# Patient Record
Sex: Male | Born: 1940 | Race: White | Hispanic: No | Marital: Married | Smoking: Former smoker
Health system: Southern US, Community
[De-identification: ages and names within clinical notes are randomized; demographics above are authoritative.]

## PROBLEM LIST (undated history)

## (undated) DIAGNOSIS — J449 Chronic obstructive pulmonary disease, unspecified: Secondary | ICD-10-CM

## (undated) DIAGNOSIS — I4891 Unspecified atrial fibrillation: Secondary | ICD-10-CM

## (undated) DIAGNOSIS — Z9289 Personal history of other medical treatment: Secondary | ICD-10-CM

## (undated) DIAGNOSIS — K819 Cholecystitis, unspecified: Secondary | ICD-10-CM

## (undated) DIAGNOSIS — I10 Essential (primary) hypertension: Secondary | ICD-10-CM

## (undated) DIAGNOSIS — I2510 Atherosclerotic heart disease of native coronary artery without angina pectoris: Secondary | ICD-10-CM

## (undated) DIAGNOSIS — I509 Heart failure, unspecified: Secondary | ICD-10-CM

## (undated) DIAGNOSIS — K449 Diaphragmatic hernia without obstruction or gangrene: Secondary | ICD-10-CM

## (undated) DIAGNOSIS — E039 Hypothyroidism, unspecified: Secondary | ICD-10-CM

## (undated) DIAGNOSIS — M1990 Unspecified osteoarthritis, unspecified site: Secondary | ICD-10-CM

## (undated) DIAGNOSIS — K219 Gastro-esophageal reflux disease without esophagitis: Secondary | ICD-10-CM

## (undated) DIAGNOSIS — N189 Chronic kidney disease, unspecified: Secondary | ICD-10-CM

## (undated) DIAGNOSIS — D649 Anemia, unspecified: Secondary | ICD-10-CM

## (undated) DIAGNOSIS — K8020 Calculus of gallbladder without cholecystitis without obstruction: Secondary | ICD-10-CM

## (undated) DIAGNOSIS — D689 Coagulation defect, unspecified: Secondary | ICD-10-CM

## (undated) DIAGNOSIS — K31819 Angiodysplasia of stomach and duodenum without bleeding: Secondary | ICD-10-CM

## (undated) DIAGNOSIS — E785 Hyperlipidemia, unspecified: Secondary | ICD-10-CM

## (undated) DIAGNOSIS — M109 Gout, unspecified: Secondary | ICD-10-CM

## (undated) DIAGNOSIS — R195 Other fecal abnormalities: Secondary | ICD-10-CM

## (undated) DIAGNOSIS — R160 Hepatomegaly, not elsewhere classified: Secondary | ICD-10-CM

## (undated) DIAGNOSIS — Z86010 Personal history of colonic polyps: Secondary | ICD-10-CM

## (undated) DIAGNOSIS — Z95810 Presence of automatic (implantable) cardiac defibrillator: Secondary | ICD-10-CM

## (undated) HISTORY — PX: CORONARY ANGIOPLASTY WITH STENT PLACEMENT: SHX49

## (undated) HISTORY — DX: Chronic obstructive pulmonary disease, unspecified: J44.9

## (undated) HISTORY — DX: Other fecal abnormalities: R19.5

## (undated) HISTORY — DX: Diaphragmatic hernia without obstruction or gangrene: K44.9

## (undated) HISTORY — PX: RENAL BIOPSY: SHX156

## (undated) HISTORY — PX: HERNIA REPAIR: SHX51

## (undated) HISTORY — DX: Hyperlipidemia, unspecified: E78.5

## (undated) HISTORY — DX: Unspecified atrial fibrillation: I48.91

## (undated) HISTORY — DX: Gastro-esophageal reflux disease without esophagitis: K21.9

## (undated) HISTORY — PX: OTHER SURGICAL HISTORY: SHX169

## (undated) HISTORY — DX: Anemia, unspecified: D64.9

## (undated) HISTORY — DX: Coagulation defect, unspecified: D68.9

## (undated) HISTORY — DX: Atherosclerotic heart disease of native coronary artery without angina pectoris: I25.10

## (undated) HISTORY — DX: Hypothyroidism, unspecified: E03.9

## (undated) HISTORY — DX: Calculus of gallbladder without cholecystitis without obstruction: K80.20

## (undated) HISTORY — DX: Angiodysplasia of stomach and duodenum without bleeding: K31.819

## (undated) HISTORY — DX: Gout, unspecified: M10.9

## (undated) HISTORY — DX: Personal history of colonic polyps: Z86.010

## (undated) HISTORY — DX: Hepatomegaly, not elsewhere classified: R16.0

## (undated) HISTORY — DX: Essential (primary) hypertension: I10

---

## 1969-06-04 HISTORY — PX: UMBILICAL HERNIA REPAIR: SHX196

## 1996-10-04 HISTORY — PX: CORONARY ARTERY BYPASS GRAFT: SHX141

## 1998-11-25 ENCOUNTER — Encounter: Payer: Self-pay | Admitting: Family Medicine

## 1998-11-25 ENCOUNTER — Ambulatory Visit (HOSPITAL_COMMUNITY): Admission: RE | Admit: 1998-11-25 | Discharge: 1998-11-25 | Payer: Self-pay | Admitting: Family Medicine

## 1998-12-08 ENCOUNTER — Encounter: Admission: RE | Admit: 1998-12-08 | Discharge: 1999-03-08 | Payer: Self-pay | Admitting: Family Medicine

## 1998-12-19 ENCOUNTER — Ambulatory Visit (HOSPITAL_COMMUNITY): Admission: RE | Admit: 1998-12-19 | Discharge: 1998-12-19 | Payer: Self-pay | Admitting: Family Medicine

## 1998-12-19 ENCOUNTER — Encounter: Payer: Self-pay | Admitting: Family Medicine

## 1999-02-11 ENCOUNTER — Encounter: Admission: RE | Admit: 1999-02-11 | Discharge: 1999-05-12 | Payer: Self-pay | Admitting: Family Medicine

## 1999-05-19 ENCOUNTER — Ambulatory Visit: Admission: RE | Admit: 1999-05-19 | Discharge: 1999-05-19 | Payer: Self-pay | Admitting: Neurosurgery

## 1999-07-22 ENCOUNTER — Ambulatory Visit (HOSPITAL_COMMUNITY): Admission: RE | Admit: 1999-07-22 | Discharge: 1999-07-22 | Payer: Self-pay | Admitting: Neurosurgery

## 1999-07-22 ENCOUNTER — Encounter: Payer: Self-pay | Admitting: Neurosurgery

## 1999-12-04 ENCOUNTER — Encounter: Admission: RE | Admit: 1999-12-04 | Discharge: 1999-12-04 | Payer: Self-pay | Admitting: Neurosurgery

## 2000-01-05 ENCOUNTER — Encounter: Admission: RE | Admit: 2000-01-05 | Discharge: 2000-02-11 | Payer: Self-pay | Admitting: *Deleted

## 2000-03-04 ENCOUNTER — Encounter: Admission: RE | Admit: 2000-03-04 | Discharge: 2000-06-02 | Payer: Self-pay | Admitting: Anesthesiology

## 2004-11-30 ENCOUNTER — Encounter: Admission: RE | Admit: 2004-11-30 | Discharge: 2004-11-30 | Payer: Self-pay | Admitting: Interventional Cardiology

## 2004-12-01 ENCOUNTER — Inpatient Hospital Stay (HOSPITAL_BASED_OUTPATIENT_CLINIC_OR_DEPARTMENT_OTHER): Admission: RE | Admit: 2004-12-01 | Discharge: 2004-12-01 | Payer: Self-pay | Admitting: Interventional Cardiology

## 2004-12-03 ENCOUNTER — Ambulatory Visit (HOSPITAL_COMMUNITY): Admission: RE | Admit: 2004-12-03 | Discharge: 2004-12-04 | Payer: Self-pay | Admitting: Interventional Cardiology

## 2004-12-11 ENCOUNTER — Ambulatory Visit (HOSPITAL_COMMUNITY): Admission: RE | Admit: 2004-12-11 | Discharge: 2004-12-11 | Payer: Self-pay | Admitting: Interventional Cardiology

## 2005-11-15 ENCOUNTER — Ambulatory Visit: Payer: Self-pay | Admitting: Internal Medicine

## 2005-12-28 ENCOUNTER — Ambulatory Visit: Payer: Self-pay | Admitting: Internal Medicine

## 2006-12-12 ENCOUNTER — Ambulatory Visit: Payer: Self-pay | Admitting: Pulmonary Disease

## 2007-01-23 ENCOUNTER — Ambulatory Visit: Payer: Self-pay | Admitting: Internal Medicine

## 2007-01-27 ENCOUNTER — Inpatient Hospital Stay (HOSPITAL_BASED_OUTPATIENT_CLINIC_OR_DEPARTMENT_OTHER): Admission: RE | Admit: 2007-01-27 | Discharge: 2007-01-27 | Payer: Self-pay | Admitting: Interventional Cardiology

## 2007-09-22 ENCOUNTER — Telehealth (INDEPENDENT_AMBULATORY_CARE_PROVIDER_SITE_OTHER): Payer: Self-pay | Admitting: *Deleted

## 2007-10-27 ENCOUNTER — Telehealth (INDEPENDENT_AMBULATORY_CARE_PROVIDER_SITE_OTHER): Payer: Self-pay | Admitting: *Deleted

## 2008-02-03 ENCOUNTER — Encounter: Payer: Self-pay | Admitting: Internal Medicine

## 2008-02-03 DIAGNOSIS — I10 Essential (primary) hypertension: Secondary | ICD-10-CM | POA: Insufficient documentation

## 2008-04-15 ENCOUNTER — Encounter: Admission: RE | Admit: 2008-04-15 | Discharge: 2008-04-15 | Payer: Self-pay | Admitting: Interventional Cardiology

## 2010-05-11 ENCOUNTER — Telehealth (INDEPENDENT_AMBULATORY_CARE_PROVIDER_SITE_OTHER): Payer: Self-pay | Admitting: *Deleted

## 2010-05-11 ENCOUNTER — Telehealth: Payer: Self-pay | Admitting: Gastroenterology

## 2010-05-11 ENCOUNTER — Encounter: Payer: Self-pay | Admitting: Gastroenterology

## 2010-05-13 ENCOUNTER — Ambulatory Visit: Payer: Self-pay | Admitting: Gastroenterology

## 2010-05-13 ENCOUNTER — Encounter (INDEPENDENT_AMBULATORY_CARE_PROVIDER_SITE_OTHER): Payer: Self-pay | Admitting: *Deleted

## 2010-05-13 ENCOUNTER — Encounter: Payer: Self-pay | Admitting: Internal Medicine

## 2010-05-13 DIAGNOSIS — R160 Hepatomegaly, not elsewhere classified: Secondary | ICD-10-CM | POA: Insufficient documentation

## 2010-05-13 DIAGNOSIS — D689 Coagulation defect, unspecified: Secondary | ICD-10-CM | POA: Insufficient documentation

## 2010-05-13 DIAGNOSIS — K219 Gastro-esophageal reflux disease without esophagitis: Secondary | ICD-10-CM | POA: Insufficient documentation

## 2010-05-13 DIAGNOSIS — J439 Emphysema, unspecified: Secondary | ICD-10-CM | POA: Insufficient documentation

## 2010-05-13 DIAGNOSIS — D509 Iron deficiency anemia, unspecified: Secondary | ICD-10-CM | POA: Insufficient documentation

## 2010-05-13 LAB — CONVERTED CEMR LAB
Folate: >20 ng/mL
Saturation Ratios: 3.8 % — ABNORMAL LOW (ref 20.0–50.0)
Vitamin B-12: 297 pg/mL (ref 211–911)

## 2010-05-18 ENCOUNTER — Encounter: Payer: Self-pay | Admitting: Internal Medicine

## 2010-05-29 ENCOUNTER — Ambulatory Visit: Payer: Self-pay | Admitting: Gastroenterology

## 2010-05-29 DIAGNOSIS — Z860100 Personal history of colon polyps, unspecified: Secondary | ICD-10-CM

## 2010-05-29 HISTORY — DX: Personal history of colonic polyps: Z86.010

## 2010-05-29 HISTORY — DX: Personal history of colon polyps, unspecified: Z86.0100

## 2010-06-03 ENCOUNTER — Encounter: Payer: Self-pay | Admitting: Gastroenterology

## 2010-06-12 ENCOUNTER — Ambulatory Visit: Payer: Self-pay | Admitting: Internal Medicine

## 2010-06-12 ENCOUNTER — Encounter: Payer: Self-pay | Admitting: Gastroenterology

## 2010-06-15 ENCOUNTER — Encounter: Payer: Self-pay | Admitting: Gastroenterology

## 2010-06-29 ENCOUNTER — Telehealth: Payer: Self-pay | Admitting: Gastroenterology

## 2010-11-03 NOTE — Letter (Signed)
Summary: Anticoagulation Modification Letter  [facility]  [place], [place] [address]   Phone: [phone]  Fax: [phone]    May 13, 2010  Re:    [name] [name] DOB:    04-03-1941 MRN:    [id number]    Dear Dr. Tamala Julian:   We have scheduled the above patient for an endoscopic procedure with Dr. Verl Blalock. Our records show that he is on anticoagulation therapy. Please advise as to how long the patient may come off their therapy of Coumadin prior to the scheduled procedure(s) on May 29, 2010.   Please fax the completed form to Abelino Derrick, [place] at [phone].  Thank you for your help with this matter.  Sincerely,  Abelino Derrick CMA Deborra Medina)   Physician Recommendation:  Hold Coumadin 5 days prior ____________  Other ______________________________     Appended Document: Anticoagulation Modification Letter Received fax from Dr. Tamala Julian.  OK to stop Coumadin for 5 days prior to colon. LM for pt to call  Appended Document: Anticoagulation Modification Letter Pt. advised to hold his Coumadin for 5 days prior to his Colonoscopy. Pt. instructed to call back as needed.

## 2010-11-03 NOTE — Procedures (Signed)
Summary: 280.0,787.1/dfs--NO PCR  Patient here today for capsule endoscopy for Dr. Sharlett Iles.  Pt verbalized understanding of all verbal and written instructions.  Pt tolerated well.  Lot #  [id number] exp 2012-10.                                                                     25

## 2010-11-03 NOTE — Progress Notes (Signed)
Summary: Test results  Phone Note Call from Patient Call back at Home Phone [phone]   Caller: Patient Call For: Dr. Sharlett Iles Reason for Call: Lab or Test Results Summary of Call: Calling about his endo capsule results Initial call taken by: Webb Laws,  June 29, 2010 1:21 PM  Follow-up for Phone Call        Left message for patient to call back [place], [facility]  June 29, 2010 1:54 PM  Left message for patient to call back Barb Merino RN, [facility]  June 30, 2010 9:57 AM  reviewed results with the patient.  All questions answered. Follow-up by: Barb Merino RN, CGRN,  June 30, 2010 11:24 AM

## 2010-11-03 NOTE — Letter (Signed)
Summary: Patient Notice-Endo Biopsy Results  [facility]  [place], [place] [address]   Phone: [phone]  Fax: [phone]        June 03, 2010 MRN: [id number]    [name] [name] [place], [place]  [address]    Dear [name],  I am pleased to inform you that the biopsies taken during your recent endoscopic examination did not show any evidence of cancer upon pathologic examination.  Additional information/recommendations:  __No further action is needed at this time.  Please follow-up with      your primary care physician for your other healthcare needs.  __ Please call [phone] to schedule a return visit to review      your condition.  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  __ You should have a repeat endoscopic examination for this problem              in _ months/years.   Please call us if you are having persistent problems or have questions about your condition that have not been fully answered at this time.  Sincerely,  Sable Feil MD [facility]  This letter has been electronically signed by your physician.  Appended Document: Patient Notice-Endo Biopsy Results letter mailed 9.2.11

## 2010-11-03 NOTE — Progress Notes (Signed)
Summary: triage  Phone Note From Other Clinic Call back at [phone]   Caller: Debbie, scheduler Call For: Dr. Sharlett Iles (doctor of the day) Reason for Call: Schedule Patient Appt Summary of Call: anemia, pos hem card... Dr. [name] would like pt worked in [place] Initial call taken by: Lucien Mons,  May 11, 2010 12:29 PM  Follow-up for Phone Call        Pt can be seen Wed 05/13/10 at 9:30.  LM for Debbie to call.  Butch Penny Surface RN  May 11, 2010 2:27 PM  LM for Jackelyn Poling re pt's appt.  (Debbie's request that we fax leave message re appt and she will contact pt.)   Requested recs to be faxed. Follow-up by: Alberteen Spindle RN,  May 11, 2010 4:24 PM

## 2010-11-03 NOTE — Letter (Signed)
Summary: Patient Notice- Colon Biospy Results  [facility]  [address] [place], [place] [address]   Phone: [phone]  Fax: [phone]        June 03, 2010 MRN: [id number]    [name] [name] [place], [place]  [address]    Dear [name],  I am pleased to inform you that the biopsies taken during your recent colonoscopy did not show any evidence of cancer upon pathologic examination.  Additional information/recommendations:  __No further action is needed at this time.  Please follow-up with      your primary care physician for your other healthcare needs.  __Please call [phone] to schedule a return visit to review      your condition.  x__Continue with the treatment plan as outlined on the day of your      exam.Small bowel camera exam to be scheduled per my nurse...she will call.  __You should have a repeat colonoscopy examination for this problem           in _ years.  Please call us if you are having persistent problems or have questions about your condition that have not been fully answered at this time.  Sincerely,  Sable Feil MD [facility]   This letter has been electronically signed by your physician.  Appended Document: Patient Notice- Colon Biospy Results letter mailed 9.2.11

## 2010-11-03 NOTE — Letter (Signed)
Summary: [facility] Instructions  [facility]  [place], [place] [address]   Phone: [phone]  Fax: [phone]       [name] [name]    [birth year]    MRN: [id number]      Procedure Day Sudie Grumbling: Wendi Snipes, 05/29/10     Arrival Time: 1:30 PM      Procedure Time: 2:30 PM    Location of Procedure:                    _X_  [place] (4th Floor)  [place]   Starting 5 days prior to your procedure 05/24/10 do not eat nuts, seeds, popcorn, corn, beans, peas,  salads, or any raw vegetables.  Do not take any fiber supplements (e.g. Metamucil, Citrucel, and Benefiber).  THE DAY BEFORE YOUR PROCEDURE         THURSDAY, 05/28/10  1.  Drink clear liquids the entire day-NO SOLID FOOD  2.  Do not drink anything colored red or purple.  Avoid juices with pulp.  No orange juice.  3.  Drink at least 64 oz. (8 glasses) of fluid/clear liquids during the day to prevent dehydration and help the prep work efficiently.  CLEAR LIQUIDS INCLUDE: Water Jello Ice Popsicles Tea (sugar ok, no milk/cream) Powdered fruit flavored drinks Coffee (sugar ok, no milk/cream) Gatorade Juice: apple, white grape, white cranberry  Lemonade Clear bullion, consomm, broth Carbonated beverages (any kind) Strained chicken noodle soup Hard Candy                             4.  In the morning, mix first dose of MoviPrep solution:    Empty 1 Pouch A and 1 Pouch B into the disposable container    Add lukewarm drinking water to the top line of the container. Mix to dissolve    Refrigerate (mixed solution should be used within 24 hrs)  5.  Begin drinking the prep at 5:00 p.m. The MoviPrep container is divided by 4 marks.   Every 15 minutes drink the solution down to the next mark (approximately 8 oz) until the full liter is complete.   6.  Follow completed prep with 16 oz of clear liquid of your choice (Nothing red or purple).  Continue to drink clear  liquids until bedtime.  7.  Before going to bed, mix second dose of MoviPrep solution:    Empty 1 Pouch A and 1 Pouch B into the disposable container    Add lukewarm drinking water to the top line of the container. Mix to dissolve    Refrigerate  THE DAY OF YOUR PROCEDURE      FRIDAY, 05/29/10  Beginning at 9:30 a.m. (5 hours before procedure):         1. Every 15 minutes, drink the solution down to the next mark (approx 8 oz) until the full liter is complete.  2. Follow completed prep with 16 oz. of clear liquid of your choice.    3. You may drink clear liquids until 12:30 PM (2 HOURS BEFORE PROCEDURE).  MEDICATION INSTRUCTIONS  Unless otherwise instructed, you should take regular prescription medications with a small sip of water   as early as possible the morning of your procedure.  Stop taking Coumadin on  _ _  (5 days before procedure). You will be contaced by our office prior to your procedure for directions on holding your  Coumadin/Warfarin.  If you do not hear from our office 1 week prior to your scheduled procedure, please call [phone] to discuss.       OTHER INSTRUCTIONS  You will need a responsible adult at least 72 years of age to accompany you and drive you home.   This person must remain in the waiting room during your procedure.  Wear loose fitting clothing that is easily removed.  Leave jewelry and other valuables at home.  However, you may wish to bring a book to read or  an iPod/MP3 player to listen to music as you wait for your procedure to start.  Remove all body piercing jewelry and leave at home.  Total time from sign-in until discharge is approximately 2-3 hours.  You should go home directly after your procedure and rest.  You can resume normal activities the  day after your procedure. The day of your procedure you should not:   Drive   Make legal decisions   Operate machinery   Drink alcohol   Return to work  You will receive  specific instructions about eating, activities and medications before you leave.  The above instructions have been reviewed and explained to me by   _______________________  I fully understand and can verbalize these instructions _____________________________ Date _________

## 2010-11-03 NOTE — Procedures (Signed)
Summary: Upper Endoscopy  Patient: [name] Note: All result statuses are Final unless otherwise noted.  Tests: (1) Upper Endoscopy (EGD)   EGD Upper Endoscopy       [facility]     [place], [place]  [address]           ENDOSCOPY PROCEDURE REPORT           PATIENT:  [name] [name], [name] [name]  MR#:  [id number]     BIRTHDATE:  1940-12-24, 89 yrs. old  GENDER:  male           ENDOSCOPIST:  Loralee Pacas. Sharlett Iles, MD, [facility]     Referred by:           PROCEDURE DATE:  05/29/2010     PROCEDURE:  EGD w/brushings, EGD with biopsy     ASA CLASS:  Class II     INDICATIONS:  iron deficiency anemia           MEDICATIONS:   There was residual sedation effect present from     prior procedure.     TOPICAL ANESTHETIC:           DESCRIPTION OF PROCEDURE:   After the risks benefits and     alternatives of the procedure were thoroughly explained, informed     consent was obtained.  The LB GIF-H180 [id number] endoscope was     introduced through the mouth and advanced to the second portion of     the duodenum, without limitations.  The instrument was slowly     withdrawn as the mucosa was fully examined.     <<PROCEDUREIMAGES>>           ULTRASONIC FINDINGS:   A hiatal hernia was found. -3 CM HH.SMALL     BOWEL BIOPSY DONE.  The upper, middle, and distal third of the     esophagus were carefully inspected and no abnormalities were     noted. The z-line was well seen at the GEJ. The endoscope was     pushed into the fundus which was normal including a retroflexed     view. The antrum,gastric body, first and second part of the     duodenum were unremarkable.    Retroflexed views revealed no     abnormalities.    The scope was then withdrawn from the patient     and the procedure completed.           COMPLICATIONS:  None           ENDOSCOPIC IMPRESSION:     1) Normal EGD     2) Hiatal hernia     NO CAUSE FOR ANEMIA.HX. OF TREATED GERD.     RECOMMENDATIONS:     1)  Await BRAVO results     2) continue current medications           REPEAT EXAM:  No           ______________________________     Loralee Pacas. Sharlett Iles, MD, [facility]           CC:  [name], MD, Olena Mater, MD           n.     Lorrin Mais:   Loralee Pacas. Alp Goldwater at 05/29/2010 03:10 PM           Clemetine Marker, [id number]  Note: An exclamation mark (!) indicates a result that was not dispersed into the flowsheet.  Document Creation Date: 05/29/2010 3:10 PM _______________________________________________________________________  (1) Order result status: Final Collection or observation date-time: 05/29/2010 15:05 Requested date-time:  Receipt date-time:  Reported date-time:  Referring Physician:   Ordering Physician: Verl Blalock [phone]) Specimen Source:  Source: Tawanna Cooler Order Number: [id number] Lab site:

## 2010-11-03 NOTE — Procedures (Signed)
Summary: Colonoscopy  Patient: [name] [name] Note: All result statuses are Final unless otherwise noted.  Tests: (1) Colonoscopy (COL)   COL Colonoscopy           [facility]     [place], [place]  [address]           COLONOSCOPY PROCEDURE REPORT           PATIENT:  [name], [name]  MR#:  [id number]     BIRTHDATE:  03/03/1941, 58 yrs. old  GENDER:  male     ENDOSCOPIST:  Loralee Pacas. Sharlett Iles, MD, [facility]     REF. BY:     PROCEDURE DATE:  05/29/2010     PROCEDURE:  Colonoscopy with snare polypectomy     ASA CLASS:  Class II     INDICATIONS:  Iron deficiency anemia     MEDICATIONS:   Fentanyl 50 mcg IV, Versed 8 mg IV           DESCRIPTION OF PROCEDURE:   After the risks benefits and     alternatives of the procedure were thoroughly explained, informed     consent was obtained.  Digital rectal exam was performed and     revealed no abnormalities.   The LB CF-H180AL [id number] endoscope     was introduced through the anus and advanced to the cecum, which     was identified by both the appendix and ileocecal valve, without     limitations.  The quality of the prep was excellent, using     MoviPrep.  The instrument was then slowly withdrawn as the colon     was fully examined.     <<PROCEDUREIMAGES>>           FINDINGS:  Mild diverticulosis was found in the sigmoid colon.     Multiple sessile polyps were found. 5 MM POLYP COLD SNARE EXCISED     AT HEPATIC FLEXURE   Retroflexed views in the rectum revealed no     abnormalities.    The scope was then withdrawn from the patient     and the procedure completed.           COMPLICATIONS:  None     ENDOSCOPIC IMPRESSION:     1) Mild diverticulosis in the sigmoid colon     2) Sessile polyp, multiple     R/O ADENOMA.NO CAUSE FOR LOW HB.     RECOMMENDATIONS:     1) Follow up colonoscopy in 5 years     2) High fiber diet.     RESUME ALL MEDS     REPEAT EXAM:  No           ______________________________   Loralee Pacas. Sharlett Iles, MD, [facility]           CC:  [name], MDDean Ree Edman, MD           n.     Lorrin MaisMarland Kitchen   Loralee Pacas. Patterson at 05/29/2010 03:06 PM           Clemetine Marker, [id number]  Note: An exclamation mark (!) indicates a result that was not dispersed into the flowsheet. Document Creation Date: 05/29/2010 3:07 PM _______________________________________________________________________  (1) Order result status: Final Collection or observation date-time: 05/29/2010 14:55 Requested date-time:  Receipt date-time:  Reported date-time:  Referring Physician:   Ordering Physician: Verl Blalock [phone]) Specimen Source:  Source: Tawanna Cooler Order Number: [id number] Lab site:  Appended Document: Colonoscopy he needs small bowel camera exam  Appended Document: Colonoscopy LM for pt to call.    Appended Document: Colonoscopy     Procedures Next Due Date:    Colonoscopy: 06/2015  Appended Document: Colonoscopy Pt notified.  Appt sch for pt to come in 06/12/10 for pillcam.  Instructions mailed to pt.   Clinical Lists Changes  Orders: Added new Test order of Capsule Endoscopy (Capsule Endoscopy) - Signed

## 2010-11-03 NOTE — Letter (Signed)
Summary: Incomplete CMN / [facility]  Incomplete CMN / [facility]   Imported By: Rise Patience 05/19/2010 11:50:46  _____________________________________________________________________  External Attachment:    Type:   Image     Comment:   External Document

## 2010-11-03 NOTE — Procedures (Signed)
Summary: CAPSULE ENDOSCOPY REPORT    ORDERED BY: Verl Blalock, MD READ BY: Silvano Rusk, MD   REASON FOR REFERRAL: 70 YO MALE WITH IRON DEFICIENCY ANEMIA. ON COUMADIN AND ASA. NEGATIVE COLON AND EGD FOR SOURCE OF BLOOD LOSS. SMALL BOWEL BX'S ALSO NEGATIVE.   PROCEDURE INFO & FINDINGS:1)COMPLETE STUDY, FAIR PREP WITH POOR VISUALIZATION BEYOND ONE HOUR, 30 MINUTES. NO FINDINGS TO EXPLAIN ANEMIA.   SUMMARY & RECOMMENDATIONS: POOR PREP LIMITS STUDY. IF CLINICALLY INDICATED REPEAT WITH MORE AGGRESSIVE PREP VS. IRON THERAPY AND REPEAT IF FAILS TO RESPOND TO THAT.   This report was created from the original endoscopy report, which was reviewed and signed by the above listed endoscopist.

## 2010-11-03 NOTE — Letter (Signed)
Summary: CMN for Oxygen/[facility]  CMN for Oxygen/[facility]   Imported By: Phillis Knack 05/21/2010 13:50:13  _____________________________________________________________________  External Attachment:    Type:   Image     Comment:   External Document

## 2010-11-03 NOTE — Procedures (Signed)
Summary: Colonoscopy  Patient: [name] [name] Note: All result statuses are Final unless otherwise noted.  Tests: (1) Colonoscopy (COL)   COL Colonoscopy           DONE (C)     [facility].     [place], [place]  [address]           COLONOSCOPY PROCEDURE REPORT           PATIENT:  [name], [name]  MR#:  [id number]     BIRTHDATE:  Jul 23, 1941, 65 yrs. old  GENDER:  male     ENDOSCOPIST:  Loralee Pacas. Sharlett Iles, MD, [facility]     REF. BY:     PROCEDURE DATE:  05/29/2010     PROCEDURE:  Colonoscopy with snare polypectomy     ASA CLASS:  Class II     INDICATIONS:  Iron deficiency anemia     MEDICATIONS:   Fentanyl 75 mcg IV, Versed 8 mg IV           DESCRIPTION OF PROCEDURE:   After the risks benefits and     alternatives of the procedure were thoroughly explained, informed     consent was obtained.  Digital rectal exam was performed and     revealed no abnormalities.   The LB CF-H180AL [id number] endoscope     was introduced through the anus and advanced to the cecum, which     was identified by both the appendix and ileocecal valve, without     limitations.  The quality of the prep was excellent, using     MoviPrep.  The instrument was then slowly withdrawn as the colon     was fully examined.     <<PROCEDUREIMAGES>>           FINDINGS:  Mild diverticulosis was found in the sigmoid colon.     Multiple sessile polyps were found. 5 MM POLYP COLD SNARE EXCISED     AT HEPATIC FLEXURE   Retroflexed views in the rectum revealed no     abnormalities.    The scope was then withdrawn from the patient     and the procedure completed.           COMPLICATIONS:  None     ENDOSCOPIC IMPRESSION:     1) Mild diverticulosis in the sigmoid colon     2) Sessile polyp, multiple     R/O ADENOMA.NO CAUSE FOR LOW HB.     RECOMMENDATIONS:     1) Follow up colonoscopy in 5 years     2) High fiber diet.     RESUME ALL MEDS     REPEAT EXAM:  No            ______________________________     Loralee Pacas. Sharlett Iles, MD, [facility]           CC:  [name], MDDean Ree Edman, MD           n.     REVISED:  06/25/2010 02:10 PM     eSIGNED:   Loralee Pacas. Patterson at 06/25/2010 02:10 PM           Clemetine Marker, [id number]  Note: An exclamation mark (!) indicates a result that was not dispersed into the flowsheet. Document Creation Date: 06/25/2010 2:11 PM _______________________________________________________________________  (1) Order result status: Final Collection or observation date-time: 05/29/2010 14:55 Requested date-time:  Receipt date-time:  Reported date-time:  Referring Physician:   Ordering Physician: Shanon Brow  Sharlett Iles [phone]) Specimen Source:  Source: Tawanna Cooler Order Number: [id number] Lab site:

## 2010-11-03 NOTE — Progress Notes (Signed)
Summary: paper work  Phone Note Call from Patient Call back at [phone]   Caller: Etheleen Sia Call For: wert Reason for Call: Talk to Nurse Summary of Call: Need to speak to nurse re: paperwork on pt. Initial call taken by: [name] Gottron,  May 11, 2010 2:07 PM  Follow-up for Phone Call        [facility] is faxing over some old cmn's that they need fixed by dr wert the cmn's are from 2007 and 2008, they will fax and we will put in mw's look at Follow-up by: [place],  May 11, 2010 2:43 PM

## 2010-11-03 NOTE — Assessment & Plan Note (Signed)
Summary: anemia, blood in stool   History of Present Illness Visit Type: Initial Consult Primary GI MD: Verl Blalock MD [place] Primary Provider: [name], MD Chief Complaint: anemia, hem positive stool History of Present Illness:   Extremely Pleasant and affable 70 year old Caucasian male with oxygen-dependent COPD followed by pulmonary physicians and Dr. [name] in [facility]. He is referred today through the courtesy of Dr. Laurance Flatten for evaluation of new-onset anemia since December with a hemoglobin of 8.7, MCV 88 MCH 26.9, and normal platelet count but a white count of 12,200. Stool card per Dr. Ree Edman at the time of rectal exam was positive.  The patient denies any GI complaints except for chronic acid reflux and he has been on Prilosec 20 mg a day for several years. He does not abuse alcohol or NSAIDs and apparently has not smoked in over 10 years. There is no history of anorexia, weight loss, nausea vomiting, abdominal pain, melena, or hematochezia. He has not had previous barium studies or endoscopic exams.  Family history is remarkable for alleged" stomach cancer" and 2 older sisters that are now dead.  Patient has a history of severe coronary artery disease and has had bypass surgery and multiple cardiac stents performed by Dr. Daneen Schick. He is on both Coumadin and aspirin. Last intervention was at least 2 years ago. He [place] has a history of gouty arthritis and is on allopurinol 300 mg a day, thyroid replacement therapy, vitamin D, and a variety of diuretics and cardiac meds.  He has dyspnea on exertion with limited exercise tolerance. He denies angina, peripheral edema, abdominal swelling, or any history of known liver disease, pancreatitis or hepatitis. Recent labs showed a normal metabolic profile including liver function test, and INR was 2.2. He does have easy bruisability but denies other bleeding difficulties. He does take Imdur 30 mg a day also.  He did  have an umbilical hernia repair by Dr. Mertha Finders many years ago. He continues to have some vague epigastric discomfort several hours after he met his had no documented episodes of small bowel obstruction.   GI Review of Systems    Reports abdominal pain, acid reflux, and  nausea.     Location of  Abdominal pain: epigastric area.    Denies belching, bloating, chest pain, dysphagia with liquids, dysphagia with solids, heartburn, loss of appetite, vomiting, vomiting blood, weight loss, and  weight gain.      Reports heme positive stool.     Denies anal fissure, black tarry stools, change in bowel habit, constipation, diarrhea, diverticulosis, fecal incontinence, hemorrhoids, irritable bowel syndrome, jaundice, light color stool, liver problems, rectal bleeding, and  rectal pain. Preventive Screening-Counseling & Management  Alcohol-Tobacco     Smoking Status: quit    Current Medications (verified): 1)  Cartia Xt 240 Mg  Cp24 (Diltiazem Hcl Coated Beads) 2)  Warfarin Sodium 5 Mg  Tabs (Warfarin Sodium) 3)  Levothyroxine Sodium 75 Mcg  Tabs (Levothyroxine Sodium) 4)  Diovan 160 Mg  Tabs (Valsartan) 5)  Furosemide 40 Mg  Tabs (Furosemide) 6)  Crestor 20 Mg Tabs (Rosuvastatin Calcium) .... Once Daily 7)  Pulmicort 0.5 Mg/28ml  Susp (Budesonide) .... Use 2-3 Times Daily As Needed 8)  Allopurinol 100 Mg  Tabs (Allopurinol) 9)  Imdur 30 Mg  Tb24 (Isosorbide Mononitrate) 10)  Aspirin 81 Mg Tbec (Aspirin) .... Once Daily 11)  Vitamin D3 5000 Unit Caps (Cholecalciferol) .... Once Daily 12)  Multivitamins  Tabs (Multiple Vitamin) .... Once  Daily  Allergies (verified): No Known Drug Allergies  Past History:  Past medical, surgical, family and social histories (including risk factors) reviewed for relevance to current acute and chronic problems.  Past Medical History: HYPERTENSION (ICD-401.9) Anemia Asthma  Past Surgical History: CABG Stent Placement Hernia Surgery  Family  History: Reviewed history and no changes required. Family History of Stomach Cancer:Sister Family History of Heart Disease:  Leukemia: Mother  Social History: Reviewed history and no changes required. Occupation: Retired Patient is a former smoker.   Review of Systems       The patient complains of arthritis/joint pain, back pain, and sleeping problems.  The patient denies allergy/sinus, anemia, anxiety-new, blood in urine, breast changes/lumps, change in vision, confusion, cough, coughing up blood, depression-new, fainting, fatigue, fever, headaches-new, hearing problems, heart murmur, heart rhythm changes, itching, menstrual pain, muscle pains/cramps, night sweats, nosebleeds, pregnancy symptoms, shortness of breath, skin rash, sore throat, swelling of feet/legs, swollen lymph glands, thirst - excessive , urination - excessive , urination changes/pain, urine leakage, vision changes, and voice change.   General:  Complains of fatigue, malaise, and sleep disorder; denies fever, chills, sweats, anorexia, weakness, and weight loss. Eyes:  Denies blurring, diplopia, irritation, discharge, vision loss, scotoma, eye pain, and photophobia. ENT:  Denies earache, ear discharge, tinnitus, decreased hearing, nasal congestion, loss of smell, nosebleeds, sore throat, hoarseness, and difficulty swallowing. CV:  Complains of dyspnea on exertion; denies chest pains, angina, palpitations, syncope, orthopnea, PND, peripheral edema, and claudication. Resp:  Complains of dyspnea with exercise; denies dyspnea at rest, cough, sputum, wheezing, coughing up blood, and pleurisy; he is on oxygen replacement therapy at nighttime.Marland Kitchen GI:  Denies difficulty swallowing, pain on swallowing, nausea, indigestion/heartburn, vomiting, vomiting blood, abdominal pain, jaundice, gas/bloating, diarrhea, constipation, change in bowel habits, bloody BM's, black BMs, and fecal incontinence. GU:  Denies urinary burning, blood in urine,  urinary frequency, urinary hesitancy, nocturnal urination, urinary incontinence, penile discharge, genital sores, decreased libido, and erectile dysfunction. MS:  Complains of joint swelling, joint stiffness, and low back pain; denies joint pain / LOM, joint deformity, muscle weakness, muscle cramps, muscle atrophy, leg pain at night, leg pain with exertion, and shoulder pain / LOM hand / wrist pain (CTS). Derm:  Denies rash, itching, dry skin, hives, moles, warts, and unhealing ulcers. Neuro:  Denies weakness, paralysis, abnormal sensation, seizures, syncope, tremors, vertigo, transient blindness, frequent falls, frequent headaches, difficulty walking, headache, sciatica, radiculopathy other:, restless legs, memory loss, and confusion. Psych:  Denies depression, anxiety, memory loss, suicidal ideation, hallucinations, paranoia, phobia, and confusion. Endo:  Denies cold intolerance, heat intolerance, polydipsia, polyphagia, polyuria, unusual weight change, and hirsutism. Heme:  Complains of bruising; denies bleeding, enlarged lymph nodes, and pagophagia. Allergy:  Denies hives, rash, sneezing, hay fever, and recurrent infections.  Vital Signs:  Patient profile:   70 year old male Height:      71 inches Weight:      232.38 pounds BMI:     32.53 Pulse rate:   68 / minute Pulse rhythm:   regular BP sitting:   160 / 52  (left arm) Cuff size:   regular  Vitals Entered By: June McMurray Beggs Deborra Medina) (May 13, 2010 9:17 AM)  Physical Exam  General:  Well developed, well nourished, no acute distress.healthy appearing.   Head:  Normocephalic and atraumatic. Eyes:  PERRLA, no icterus.exam deferred to patient's ophthalmologist.   Neck:  Supple; no masses or thyromegaly. Lungs:  Clear throughout to auscultation. Heart:  Regular rate and rhythm;  no murmurs, rubs,  or bruits.heart murmur systolic:.   Abdomen:  Rather Marked hepatomegaly the right upper quadrant with a firm but nontender liver edge  several centimeters below the right costal margin. There is no splenomegaly, other abdominal masses or evidence of ascites. He does have a mild positive hepatojugular reflux response. There is evidence of a previous umbilical hernia repair. Rectal:  deferred until time of colonoscopy.  deferred until time of colonoscopy.   Msk:  Symmetrical with no gross deformities. Normal posture. Pulses:  Pedal pulses appear slightly diminished but intact. Extremities:  No clubbing, cyanosis, edema or deformities noted.1+ pedal edema.  edema and right leg greater than left. There is no evidence of phlebitis or cellulitis. Neurologic:  Alert and  oriented x4;  grossly normal neurologically. Cervical Nodes:  No significant cervical adenopathy. Psych:  Alert and cooperative. Normal mood and affect.   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 He is increased risk for colonoscopy but appears to be in fairly good cardiac compromise at this time with only mild CHF despite his progressive anemia. I have ordered an anemia profile for iron, B12, and folate level and will start oral iron replacement therapy. Colonoscopy and endoscopy have been scheduled at his request in 2 weeks' time. I will hold his Coumadin 5 days before this procedure unless otherwise advised by Dr. Daneen Schick is cardiologist. He will be prepped with a balanced electrolyte solution and have close cardiac and pulmonary monitorization during his procedures. Orders: TLB-B12 + Folate Pnl [id number]) TLB-Ferritin ([id number]) TLB-IBC Pnl (Iron/FE;Transferrin) ([id number]-[id number]) Colon/Endo (Colon/Endo)  Problem # 2:  HEPATOMEGALY (ICD-789.1) Assessment: Unchanged Probable congestive hepatomegaly associated with his cardiac congestive heart failure problems related to ischemic  heart disease and worsening anemia. He appears very well compensated at this time. Recent liver function tests have been normal and there is no evidence of chronic liver disease, ascites, mental status changes etc. He probably at some point we'll need CT scan of his abdomen and pelvis dependent on his colonoscopy results.I cannot appreciate a large umbilical hernia or protrusion in his umbilicus area at this time. He has a history of previous umbilical hernia repair surgery by Dr. Mertha Finders.  Problem # 3:  GERD (ICD-530.81) Assessment: Improved Continue Prilosec 20 mg a day. He also is a good candidate for Barrett's mucosa from his chronic GERD. Noted is the presence of a family history of possible gastric carcinoma. This will be assessed at the time of endoscopy and also we will check him for chronic H. pylori infection.  Problem # 4:  COAGULOPATHY (ICD-286.9) Assessment: Unchanged We Will Hold Coumadin 5 days before his procedure but continue aspirin unless otherwise advised by cardiology  Problem # 5:  HYPERTENSION (ICD-401.9) Assessment: Improved blood pressure today is 160/52 and pulse was 68 and regular. He is to continue all his other medications per doctor's Smith and more. These are listed and reviewed his record.  Problem # 6:  CHRONIC OBSTRUCTIVE PULMONARY DISEASE, OXYGEN DEPENDENT (ICD-496) Assessment: Unchanged continue medications and followup with pulmonary medicine as needed.  Patient Instructions: 1)  Please go to the basement to have your lab tests drawn today.  2)  We will call you with further follow up after reviewing these results.  3)  Please pick up your medications at your pharmacy.  4)  We will see you at your procedure on  05/29/10. 5)  [facility] Patient Information Guide given to patient.  6)  Colonoscopy and Flexible Sigmoidoscopy brochure given.  7)  Upper Endoscopy brochure given.  8)  The medication list was reviewed and reconciled.  All changed  / newly prescribed medications were explained.  A complete medication list was provided to the patient / caregiver. 9)  Copy sent to : Dr. [name] and Dr. Daneen Schick 10)  Samples of oral iron replacement therapy given patient. 11)  Please continue current medications.  Prescriptions: INTEGRA F 125-1 MG CAPS (FE FUM-FEPOLY-FA-VIT C-VIT B3) Take 1 capsule by mouth once a day  #30 x 3   Entered by:   Abelino Derrick CMA ([place])   Authorized by:   Sable Feil MD [facility]   Signed by:   Abelino Derrick CMA ([place]) on 05/13/2010   Method used:   Electronically to        [facility] [phone]* (retail)       [place], [place]  [address]       Ph: [phone] or [phone]       Fax: [phone]   RxID:   [id number] MOVIPREP 100 GM  SOLR (PEG-KCL-NACL-NASULF-NA ASC-C) As per prep instructions.  #1 x 0   Entered by:   Abelino Derrick CMA ([place])   Authorized by:   Sable Feil MD [facility]   Signed by:   Abelino Derrick CMA ([place]) on 05/13/2010   Method used:   Electronically to        [facility] [phone]* (retail)       [address]       [place], [place]  [address]       Ph: [phone] or [phone]       Fax: [phone]   RxID:   [id number]

## 2011-02-19 NOTE — Op Note (Signed)
[facility]  Patient:    [name] [name], [name] [name]                      MRN: [id number] Proc. Date: 03/11/00 Adm. Date:  [id number] Attending:  Nicholaus Bloom CC:         Clois Dupes, M.D.             Lonzo Candy, M.D.                           Operative Report  PROCEDURE:  Lumbar epidural steroid injection.  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  Possible disk herniation at T11-12.  INDICATIONS:  The patient has noted a mild improvement at best after his injection. He has not changed any of his medications since his last visit.  PHYSICAL EXAMINATION:  VITAL SIGNS: Blood pressure 142/59, heart rate 77, respiratory rate 16, O2 saturations 96%.  Pain level is 6 out of 10. Temperature is 97.7.  NEUROLOGICAL: Unchanged from his last visit.  DESCRIPTION OF PROCEDURE:  After informed consent was obtained, the patient was  placed in the sitting position and monitored.  His back was prepped with Betadine x 3.  A skin wheal was raised at the L4-5 interspace with 1% lidocaine.  A 20 gauge Tuohy needle was introduced at the lumbar epidural space to a loss of resistance to preservative-free normal saline.  There was no CSF nor blood.  120 mg of Medrol and 10 ml of preservative-free normal saline was gently injected.  The needle was flushed with preservative-free normal saline and removed intact.  Postprocedure condition - stable.  DISCHARGE INSTRUCTIONS:  1) Resume previous diet.  2) Limitation of activities er instruction sheet.  3) Continue on current medications.  4) Follow up with me in one week for a third injection.  ADDENDUM:  The patient noted that he has having increasing neck discomfort.  He has received Accupuncture from Sterling Big, M.D. for this and may need to return for further treatments. DD:  03/11/00 TD:  03/15/00 Job:  [id number]

## 2011-02-19 NOTE — H&P (Signed)
[name] [name], [name] [name]               ACCOUNT NO.:  [id number]   MEDICAL RECORD NO.:  [id number]          PATIENT TYPE:  OIB   LOCATION:  1963                         FACILITY:  [facility]   PHYSICIAN:  Belva Crome, M.D.   DATE OF BIRTH:  09-Sep-1941   DATE OF ADMISSION:  01/27/2007  DATE OF DISCHARGE:                              HISTORY & PHYSICAL   REASON FOR STUDY:  Shortness of breath, abnormal Cardiolite.   HISTORY OF PRESENT ILLNESS:  [name] [name] is a well-known patient to our  practice who has coronary artery disease.  He has undergone coronary  artery bypass grafting in the past with the following grafts:  LIMA to  the LAD, saphenous vein graft to the ramus, saphenous vein graft to the  PDA, and saphenous vein graft to the distal LAD (totally occluded).  He  has been having some shortness of breath with exertion but no true chest  pain.  He did have a Cardiolite to further evaluate his symptoms.  The  Cardiolite was performed on January 09, 2007 and showed an inferior  infarction with peri-infarction ischemia with apical ischemia as well.  Wall motion was not possible to gate; however, his last EF was 40%.  He  was then brought in to the cardiac catheterization lab today for native  blood vessel and graft visualization.   PAST MEDICAL HISTORY:  1. Chronic ischemic heart disease.  2. CAD.  3. LV dysfunction, EF 40%.  4. Hypertension.  5. Chronic atrial fibrillation.  6. Long-term Coumadin therapy.  7. Hyperlipidemia.  8. A history of COPD.   CURRENT MEDICATIONS:  1. Cardia XT at 240 mg a day.  2. Diovan 160 mg a day.  3. Coumadin as directed.  4. Synthroid 75 mcg a day.  5. Nebulizer treatment t.i.d. p.r.n.  6. Lasix 40 mg a day.  7. Lipitor 10 mg a day.  8. Imdur 30 mg a day.  9. Allopurinol 100 mg a day.  10.Baby aspirin daily.   ALLERGIES:  NO KNOWN DRUG ALLERGIES.   FAMILY HISTORY:  Positive for asthma, coronary artery disease,  hypertension, and leukemia.   SOCIAL HISTORY:  Nonsmoker, nondrinker, no illicit drug use.   PHYSICAL EXAM:  VITAL SIGNS:  Pulse 68, blood pressure 148/94, O2  saturation is 95% on room air, height 5 feet 11 inches, weight 238.  HEENT:  There were no carotid or subclavian bruits, no JVD or  thyromegaly.  Sclerae clear, conjunctivae normal.  Nares without  drainage.  CHEST:  Clear to auscultation bilaterally.  No wheezing or rhonchi.  HEART:  Irregular irregular with no gross murmur.  ABDOMEN:  Benign.  LOWER EXTREMITY:  No peripheral edema.  SKIN:  Warm and dry.   ASSESSMENT AND PLAN:  1. Dyspnea on exertion.  2. Abnormal Cardiolite.  3. Known coronary artery disease as described above.  4. Chronic atrial fibrillation.  5. Long-term Coumadin therapy.  We will check an INR today before      catheterization.  6. Chronic obstructive pulmonary disease.  7. Hyperlipidemia.  8. Hypothyroidism.   INR will be checked  prior to cardiac catheterization with graft  visualization.  This is documented to be subtherapeutic.  The patient  will proceed with cardiac catheterization under the direction of Dr.  Daneen Schick.      Joesphine Bare, P.A.      Belva Crome, M.D.  Electronically Signed    LB/MEDQ  D:  01/27/2007  T:  01/27/2007  Job:  [id number]   cc:   Belva Crome, M.D.

## 2011-02-19 NOTE — Assessment & Plan Note (Signed)
[facility]                             PULMONARY OFFICE NOTE   NAME:Schnorr, AAZIM FIGG                      MRN:          [id number]  DATE:12/12/2006                            DOB:          03-Mar-1941    HISTORY OF PRESENT ILLNESS:  The patient is a 70 year old white male  patient of Dr. Gustavus Bryant  who has a known history of COPD who presents for  an acute office visit. The patient complains over the last two weeks he  has had nasal congestion, post-nasal drip and cough with clear mucus.  The patient also complains that he feels very stuffed up in his head and  has had some hoarseness as well. The patient has been seen by his  primary care physician about 3-4 weeks ago with some dizziness and  lightheadedness. Has been referred to neurology, which he has an  appointment on 12/26/2006. The patient denies any chest pain, shortness  of breath, visual changes, speech changes or extremity weakness.   PAST MEDICAL HISTORY:  Reviewed.   CURRENT MEDICATIONS:  Reviewed.   PHYSICAL EXAMINATION:  The patient is pleasant male in no acute  distress. He is afebrile with stable vital signs. O2 saturation is 95%  on room air.  HEENT: Nasal mucosa is pale with some mild turbinate edema, right  greater than left. Nontender sinuses. Conjunctivae not injected.  Posterior pharynx is clear.  NECK: Supple without cervical adenopathy. No JVD.  LUNGS: Lung sounds are clear to auscultation, slightly diminished at the  bases.  CARDIAC: Regular rate.  ABDOMEN: Soft and nontender.  EXTREMITIES: Warm without edema.  SKIN: Warm without rash.  NEURO: No focal deficits detected.   IMPRESSION/PLAN:  Acute rhinitis. The patient is to begin Mucinex DM  twice daily. Clarinex 5 mg daily. The patient is to begin Veramyst nasal  spray two puffs twice daily along with saline nasal spray. The patient  is to return back with Dr. Melvyn Novas in 3 to 4 weeks or sooner if needed.      Rexene Edison, NP  Electronically Signed      Christena Deem. Melvyn Novas, MD, [facility]  Electronically Signed   TP/MedQ  DD: 12/12/2006  DT: 12/12/2006  Job #: [id number]

## 2011-02-19 NOTE — Cardiovascular Report (Signed)
NAMEGAYLON, PEPER               ACCOUNT NO.:  [id number]   MEDICAL RECORD NO.:  [id number]          PATIENT TYPE:  OIB   LOCATION:  6501                         FACILITY:  [facility]   PHYSICIAN:  Belva Crome III, M.D.DATE OF BIRTH:  1941/04/14   DATE OF PROCEDURE:  12/01/2004  DATE OF DISCHARGE:                              CARDIAC CATHETERIZATION   INDICATIONS FOR PROCEDURE:  Coronary bypass grafting 1998 with a LIMA graft  to the LAD, saphenous vein graft to the distal LAD, saphenous vein graft to  the ramus intermedius branch and saphenous vein graft to the PDA. He has had  recent clinical symptoms of angina and it was felt that coronary angiography  was indicated to define anatomy and rule out bypass graft failure.   PROCEDURE PERFORMED:  1.  Left heart cath.  2.  Selective coronary angio.  3.  Left ventriculography.  4.  Saphenous vein graft angiography.  5.  Left internal mammary graft angiography.   DESCRIPTION OF PROCEDURE:  After informed consent, a 4-French sheath was  placed in the right femoral artery using the modified Seldinger technique. A  4-French A2 multipurpose catheter was used for hemodynamic recordings, left  ventriculography by hand injection, and bypass graft angiography, saphenous  vein. We used a #4 left Judkins 4-French catheter for left coronary  angiography. The internal mammary artery was selectively engaged with a 4-  [place] internal mammary artery catheter and the same catheter was used for  right coronary angiography. 200 mcg of intracoronary nitroglycerin was also  administered into the right coronary. The patient tolerated the procedure  without complication.   RESULTS:  1.  HEMODYNAMIC DATA:      1.  Aortic pressure 160/83.      2.  Left ventricle pressure 160/18.  2.  LEFT VENTRICULOGRAPHY:  The left ventricle is poorly opacified. It was      opacified by hand injection using the multipurpose catheter. __________      apical  contractility is normal to low normal and there is mild to      moderate inferior hypokinesis. Overall EF is felt to be reduced in the      40% range. No mitral regurgitation noted.  3.  CORONARY ARTERY ANGIOGRAPHY:      1.  Left main coronary:  Proximal and mid generalized 30-40% narrowing.      2.  Left anterior descending coronary:  Totally occluded at its origin          from the left main.      3.  Ramus branch:  The ramus branch is widely patent. There is          competitive flow in the distal ramus due to saphenous vein graft          flow into the distal right ramus.      4.  Circumflex artery:  The circumflex coronary artery is small,          nondominant, gives off a very very tiny first obtuse marginal and          there is  a 60-70% mid vessel narrowing.          1.  RIGHT CORONARY:  The native right coronary is a large vessel              that gives origin to the PDA and a large left ventricular              branch. There is greater than 90% stenosis of the mid RCA over a              relatively segmented area. There is competitive flow in the PDA              due to patent saphenous vein graft to the PDA. No other              significant obstructions are noted.          2.  BYPASS GRAFT ANGIOGRAPHY:      5.  Saphenous vein graft to the PDA:  This graft is patent and there is          significant high-grade disease in the proximal segment with tandem          80+ percent stenosis.      6.  Saphenous vein graft to the ramus intermedius:  This graft is widely          patent. There is competitive flow through the native circulation.          The distal LAD fills by collaterals from the distal tips of the          ramus.      7.  Saphenous vein graft to the distal LAD:  This graft is totally          occluded and the aortal ostial junction.          1.  LIMA TO THE LAD:  This graft is widely patent. This graft is to              the mid LAD and supplies the mid and proximal  LAD as well as the              septal perforators and diagonals. The LAD beyond the graft              insertion site is totally occluded. The LAD beyond the graft              insertion site was supplied by the saphenous vein graft is now              totally occluded.   CONCLUSION:  1.  Bypass graft failure with occlusion of the saphenous vein graft to the      distal left anterior descending, high-grade obstruction of the saphenous      vein graft to the posterior descending artery. The saphenous vein graft      to the ramus intermedius and internal mammary graft to the left anterior      descending are widely patent.  2.  Severe native vessel coronary disease with total occlusion of the      proximal left anterior descending. Total occlusion of mid left anterior      descending just beyond the left internal mammary artery graft insertion      at the mid left anterior descending , and 95% stenosis of the mid right      coronary artery. The native circumflex contains moderate disease but is  nondominant and very small.  3.  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   PLAN:  Native right coronary artery stent December 03, 2004.  Will start Plavix  today. He will use nitroglycerin sublingually for recurring episodes of pain  if he has any. His symptoms have already been significantly improved by  Imdur.      HWS/MEDQ  D:  12/01/2004  T:  12/01/2004  Job:  [id number]   cc:   Chipper Herb, M.D.  [address] [place]  [place] [address]  Fax: [phone]

## 2011-02-19 NOTE — Procedures (Signed)
[facility]  Patient:    [name] [name], [name] [name]                      MRN: [id number] Proc. Date: 03/04/00 Adm. Date:  [id number] Attending:  Nicholaus Bloom CC:         Clois Dupes, M.D.             Dr. Candis Schatz                           Procedure Report  PROCEDURE:  Lumbar epidural steroid injection.  DIAGNOSES: . 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 2. 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 3. Possible disk herniation at T11-12.  HISTORY:  The patient is a 70 year old who was sent to [place] by Dr. Floyde Parkins for a series of lumbar epidural steroid injections.  The patient stated that he was in his usual state of health up until a work related injury which occurred on October 14, 1998.  Since then, he has not been able to work.  He injured his neck and his lower back.  He has seen Dr. Joya Salm with regard to his neck, where he has been found to have some cervical spinal stenosis, and the discuss of surgery was made. He complains of an achy, popping type discomfort in his neck, which is made worse by lifting and improved by rest.  He has been seeing Dr. Sigurd Sos, who has been giving him accupuncture for this.  He occasionally has some numbness and tingling in the upper extremities as well as out into the hands.  He is also seeing Dr. Jannifer Franklin for this.  His low back pain began on October 15, 1999.  He apparently fell through a ceiling. He has pain greater in his left leg than his right leg.  He notes that his pain is made worse by walking and improved by rest.  He is currently getting accupuncture for this, which temporarily helps.  He has been given Vioxx and oral corticosteroids in the past with no benefit.  He describes this pain as a "hurt." It is associated with numbness and tingling and with some weakness in the lower  extremities.  He denies any bowel or bladder incontinence.  CURRENT MEDICATIONS:   Procardia XL, Lipitor, Lotensin, Synthroid, Flovent, Serevent, albuterol and allergy shots.  ALLERGIES:  No known drug allergies.  FAMILY HISTORY:  Positive for asthma, coronary artery disease, hypertension and  leukemia.  PAST SURGICAL HISTORY:  Significant for umbilical hernia repair and CABG.  SOCIAL HISTORY:  The patient is a nonsmoker and nondrinker.  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  ACTIVE MEDICAL PROBLEMS:  Coronary artery disease status post CABG. Hypertension on medications.  Asthma on inhalers.  Osteoarthritis.  Hypothyroidism on replacement.  Allergies, for which he is on allergy shots.  Grief secondary to oss of his job.  REVIEW OF SYSTEMS:  GENERAL: Negative.  HEAD: Negative.  EYES: Negative.  NOSE,  MOUTH AND THROAT: Negative.  EARS: Negative.  SKIN: Significant for itching, for which he talked with his pulmonologist.  PULMONARY AND CARDIOVASCULAR: See active medical problems.  GI/GU: Negative.  MUSCULOSKELETAL AND NEUROLOGIC: See the HPI. CUTANEOUS: See above.  ENDOCRINE: Positive for thyroid problems.  HEMATOLOGIC: Negative.  PSYCHIATRIC: Positive for grief.  ALLERGY: See active medical problems.  PHYSICAL EXAMINATION:  VITAL SIGNS:  Blood pressure 147/62, heart rate 67, respiratory rate 16, O2 saturations  94%, pain level 8/10, temperature 98.7.  GENERAL: Obese male in no acute distress.  He looks younger than his stated age.  HEAD:  Normocephalic and atraumatic.  EYES:  Extraocular movements intact with conjunctivae and sclerae clear.  NOSE:  Patent nares with right septal deviation.  OROPHARYNX:  Significant for dental plates with no mucosal membrane lesions.  NECK:  Crepitus on range of motion.  Carotids 2+ and symmetric without bruits.  LUNGS:  Clear.  HEART:  Regular rate and rhythm.  ABDOMEN:  Obese.  GENITALIA AND RECTAL:  Not performed.  BACK:  Tenderness to percussion over the vertebrae with straight leg  raise signs positive on the left side at 70 degrees.  EXTREMITIES:  The patient demonstrated crepitus on range of motion of his shoulders with radial pulses 2+ and symmetric.  The lower extremities demonstrated dorsalis pedis pulses 2+ and symmetric with no cyanosis or edema.  NEUROLOGIC:  The patient was oriented x 4.  Cranial nerves II-XII were grossly intact.  Deep tendon reflexes were symmetric in the upper and lower extremities  with down going toes.  Motor was 5/5 with symmetric bulk and tone.  Sensory exam showed decreased sensation over the lateral thighs bilaterally.  Coordination was grossly intact.  IMPRESSION: 1. Low back pain and neck pain related to a work related injury with MRI    demonstrating some lipomatosis at multiple levels and degenerative disk disease    in the lumbar region and cervical spine showing cervical spinal stenosis. 2. Multiple other medical problems including coronary artery disease, hypertension,    asthma, osteoarthritis, hypothyroidism, grief, and allergic rhinitis per primary    care physician.  DISPOSITION:  I discussed the potential risks, benefits and limitations of a lumbar epidural steroid injection in detail with the patient and his wife.  They are interested in proceeding with this.  DESCRIPTION OF PROCEDURE:  After informed consent was obtained, the patient was  placed in the sitting position and monitored.  His back was prepped with Betadine x 3.  A skin wheal was raised at the  L4-5 interspace with 1% lidocaine.  A 20 gauge Tuohy needle was introduced to the lumbar epidural space with loss of resistance to preservative free normal saline.  There was no CSF or blood. Then, 80 mg of Medrol and 10 ml of preservative free normal saline was gently injected. The needle was flushed with preservative free normal saline and removed intact.   POST PROCEDURE CONDITION:  Stable.  DISCHARGE INSTRUCTIONS: 1. Resume previous  diet. 2. Limitations in activities per instruction sheet. 3. Continue on current medications.  4. Follow up with me in one week for a second injection. DD:  03/04/00 TD:  03/08/00 Job: [id number]

## 2011-02-19 NOTE — Assessment & Plan Note (Signed)
[name] [name]                             PULMONARY OFFICE NOTE   NAME:[name] [name], [name] [name]                      MRN:          [id number]  DATE:01/23/2007                            DOB:          27-Sep-1941    HISTORY:  This is a 70 year old white male former smoker with COPD and  morbid obesity, who states for the first time that he is definitely  feeling improved since his previous visit here on December 12, 2006 and has  been maintained on a triple combination of nebulizer's since that time.  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 He denies any  variability with weather or environmental changes.  He denies chest  pain, PND, purulent sputum, fevers, chills, sweats, orthopnea or morning  A.M. cough or congestion.   MEDICATIONS:  His medications were reviewed in the column dated January 23, 2007 and do not include any new medications.  He rarely needs  albuterol rescue now.   PHYSICAL EXAMINATION:  GENERAL APPEARANCE:  He is a pleasant, obese,  ambulatory white male in no acute distress.  VITAL SIGNS:  He is afebrile.  Weight is 239 pounds which is up 5 pounds  from baseline.  HEENT:  Unremarkable. Pharynx clear.  LUNG:  Fields are completely clear bilaterally to auscultation and  percussion.  HEART:  Regular rate and rhythm without murmurs, rubs or gallops.  ABDOMEN:  Soft, benign but obese.  EXTREMITIES:  Warm without calf tenderness, cyanosis, clubbing or edema.   CLINICAL DATA:  Spirometry is reviewed with the patient and is  unfortunately only 10% at baseline, 50% after coaching.   IMPRESSION:  Chronic obstructive pulmonary disease with a minimum  asthmatic component that seems to be responding to triple combination  therapy.  He still needs to remember to use albuterol on a p.r.n. basis  and if he is using it effectively and it is not working to bring him  back to  baseline, we definitely need to see him back here.  Otherwise,  what he needs to concentrate on is continuing towards reconditioning  himself and gain control over his long term weight issues.   Pulmonary followup can be p.r.n. here in [place].     Christena Deem. Melvyn Novas, MD, [facility]  Electronically Signed    MBW/MedQ  DD: 01/23/2007  DT: 01/24/2007  Job #: [id number]   cc:   Chipper Herb, M.D.

## 2011-02-19 NOTE — Cardiovascular Report (Signed)
NAMEXADEN, RITZMAN               ACCOUNT NO.:  [id number]   MEDICAL RECORD NO.:  [id number]          PATIENT TYPE:  OIB   LOCATION:  6524                         FACILITY:  [facility]   PHYSICIAN:  Belva Crome III, M.D.DATE OF BIRTH:  [birth year]   DATE OF PROCEDURE:  12/03/2004  DATE OF DISCHARGE:                              CARDIAC CATHETERIZATION   Dr. Belva Crome dictating a percutaneous coronary intervention procedure  note on [name] [name]. Copy to Dr. Chipper Herb in [place].   INDICATIONS FOR PROCEDURE:  High-grade native right coronary that is being  supplied by a saphenous vein graft and has high-grade obstructive disease.  This procedure is being done to stent the native right coronary territory  that is threatened because of graft failure.   PROCEDURE PERFORMED:  Taxus drug-eluting stent, implantation in the midright  coronary.   DESCRIPTION:  After informed consent, a 6-French sheath was placed in the  right femoral artery using the modified Seldinger technique. A bolus  followed by an infusion of an Angiomax was given. The patient has been on  Plavix for 48 hours receiving an initial 150 mg loading dose. He had Plavix  yesterday and also this morning. After the ACT was documented to be greater  than 220, a BMW wire was passed down to the distal right coronary and  predilatation with the Maverick 15 x 3.0 mm balloon was performed. We then,  with some difficulty, deployed a 24 mm x 3.0 mm Taxus stent. This did  require buddy wire in which case we used an Asahi medium wire. With a buddy  wire technique, the stent did easily track down the vessel to the midvessel  location of the stenosis. Multiple balloon inflations were then performed up  to 15 atmospheres after the buddy wire had been removed. We then postdilated  with a 3.25 mm, 9 mm long Maverick balloon in the mid and proximal portion  of this stent up to 13 atmospheres which was an effective diameter of  3.48.  The patient tolerated the procedure without significant chest discomfort or  complications. AngioSeal arteriotomy closure was not attempted because of  iliac disease.   CONCLUSION:  Successful stent midright coronary artery with reduction in  stenosis from greater than 90% to 0% with TIMI grade III flow. No  complications occurred.   PLAN:  Aspirin and Plavix. Angiomax has been discontinued.      HWS/MEDQ  D:  12/03/2004  T:  12/04/2004  Job:  [id number]   cc:   Chipper Herb, M.D.  [address] [place]  [place] [address]  Fax: [phone]

## 2011-02-19 NOTE — Cardiovascular Report (Signed)
[name] [name], [name] [name]               ACCOUNT NO.:  [id number]   MEDICAL RECORD NO.:  [id number]          PATIENT TYPE:  OIB   LOCATION:  1963                         FACILITY:  [facility]   PHYSICIAN:  Belva Crome, M.D.   DATE OF BIRTH:  1941/09/24   DATE OF PROCEDURE:  DATE OF DISCHARGE:                            CARDIAC CATHETERIZATION   INDICATIONS:  The patient has an abnormal Cardiolite.  He has a history  of coronary artery bypass grafting.  He has a history of coronary bypass  failure.  He has had PCI on the native right coronary since bypass  grafting.  Recently, he has had dyspnea and mild chest discomfort.  This  study is being done to rule out progression of coronary disease or  native right coronary failure following stenting in 2006.   PROCEDURE PERFORMED:  1. Left heart catheter.  2. Selective coronary angio.  3. Left ventriculography.  4. Bypass graft angiography.  5. Internal mammary graft angiography.   DESCRIPTION:  After informed consent, a 4-French sheath was placed using  modified Seldinger technique.  A 4-French A2 multipurpose catheter was  then used for hemodynamic recordings, left ventriculography by hand  injection, and selective right coronary angiography.  We also performed  bypass graft angiography with the catheters as well, being able to  cannulate the ostium all three saphenous vein grafts.  We then used a  left Judkins 4-French catheter for left coronary angiography and  internal mammary catheter for left internal mammary artery angiography.  The patient tolerated procedure without complications.   RESULTS:  1. Hemodynamic data.      a.     Aortic pressure 131/78.      b.     Left ventricular pressure 131/12.  2. Left ventriculography:  Left ventricle is dilated.  There is global      hypokinesis.  EF is estimated to be in the 40% range.  No      significant mitral regurgitation noted.  LV was only faintly      opacified by hand injection.  3.  Coronary angiography.      a.     Left main coronary:  The ostial 40% narrowing.      b.     Left anterior descending coronary:  Totally occluded       proximally after the first septal perforator.  First septal       perforator contains 40% mid perforator stenosis that is eccentric.      c.     Ramus branch:  Totally occluded in the mid vessel.      d.     Circumflex artery:  Circumflex is totally occluded at the       ostium from the left main.      e.     Right coronary:  This is a large dominant vessel giving       large PDA and left ventricular branch.  There is diffuse plaquing       proximal to distal vessel.  The mid stented region is widely  patent with mild in-stent restenosis of up to 30% diffusely.       There is diffuse plaquing throughout the distal and mid right       coronary beyond the stent but no significant obstructive lesions       were present.  4. Bypass graft angiography.      a.     Saphenous vein graft to the right coronary:  Totally       occluded at the aorto ostial junction.      b.     Saphenous vein graft to the distal LAD:  Totally occluded at       the aorto ostial junction.      c.     Saphenous vein graft to the ramus intermedius branch:       Widely patent.  The distal portion of the ramus intermedius       supplies collaterals to the apical LAD.  This is unchanged from       prior angiogram in 2006.  5. Left internal mammary graft to the LAD:  Left internal mammary      graft is widely patent.  It is plugged into the mid-LAD.  The LAD      is patent retrograde from the graft insertion site totally occluded      antegrade from the graft insertion site.  This is not significantly      different than the prior angiogram.   CONCLUSION:  1. Bypass graft failure with total occlusion of saphenous vein graft      to the right coronary and to the distal LAD.  2. Patent LIMA to the mid LAD and saphenous vein graft to the ramus.  3. Severe native  vessel coronary disease with total occlusion of the      LAD after the first septal perforator, total occlusion of a ramus      intermedius branch, total occlusion of distal circumflex.  There is      40% left main.  The large first septal perforator contains 50%      stenosis.  4. Patent LIMA to the LAD  5. Widely patent right coronary previously stented without evidence of      in-stent restenosis in the mid vessel  6. Abnormal LV function.  EF estimated to be 30-40%   DISCUSSION:  No significant change in anatomy has occurred when compared  to the angiogram done at the time of right coronary stenting 2 years  ago.  Continue medical therapy.      Belva Crome, M.D.  Electronically Signed     HWS/MEDQ  D:  01/27/2007  T:  01/27/2007  Job:  [id number]

## 2011-05-19 ENCOUNTER — Other Ambulatory Visit: Payer: Self-pay | Admitting: Gastroenterology

## 2011-06-26 ENCOUNTER — Other Ambulatory Visit: Payer: Self-pay | Admitting: Gastroenterology

## 2011-09-15 ENCOUNTER — Encounter (HOSPITAL_COMMUNITY): Payer: Self-pay

## 2011-09-23 ENCOUNTER — Ambulatory Visit (HOSPITAL_COMMUNITY)
Admission: RE | Admit: 2011-09-23 | Payer: Medicare Other | Source: Ambulatory Visit | Admitting: Interventional Cardiology

## 2011-09-23 ENCOUNTER — Telehealth: Payer: Self-pay | Admitting: Gastroenterology

## 2011-09-23 ENCOUNTER — Encounter (HOSPITAL_COMMUNITY): Admission: RE | Payer: Self-pay | Source: Ambulatory Visit

## 2011-09-23 SURGERY — LEFT HEART CATHETERIZATION WITH CORONARY ANGIOGRAM
Anesthesia: LOCAL

## 2011-09-23 NOTE — Telephone Encounter (Signed)
Received copies from [facility] 12.20.12. Forwarded  8 pages to Dr. Sharlett Iles ,for review. sj

## 2011-10-05 DIAGNOSIS — K31819 Angiodysplasia of stomach and duodenum without bleeding: Secondary | ICD-10-CM

## 2011-10-05 HISTORY — DX: Angiodysplasia of stomach and duodenum without bleeding: K31.819

## 2011-10-05 HISTORY — PX: ESOPHAGOGASTRODUODENOSCOPY: SHX1529

## 2011-10-05 HISTORY — PX: GIVENS CAPSULE STUDY: SHX5432

## 2011-10-05 HISTORY — PX: COLONOSCOPY: SHX174

## 2011-11-16 ENCOUNTER — Telehealth: Payer: Self-pay | Admitting: Gastroenterology

## 2011-11-16 NOTE — Telephone Encounter (Signed)
Received labs from [facility] 4 pages.  Sent up to Dr. Sharlett Iles 11/16/2011 ASW

## 2011-11-26 ENCOUNTER — Other Ambulatory Visit (INDEPENDENT_AMBULATORY_CARE_PROVIDER_SITE_OTHER): Payer: Medicare Other

## 2011-11-26 ENCOUNTER — Ambulatory Visit (INDEPENDENT_AMBULATORY_CARE_PROVIDER_SITE_OTHER): Payer: Medicare Other | Admitting: Gastroenterology

## 2011-11-26 ENCOUNTER — Encounter: Payer: Self-pay | Admitting: Gastroenterology

## 2011-11-26 ENCOUNTER — Telehealth: Payer: Self-pay | Admitting: Gastroenterology

## 2011-11-26 DIAGNOSIS — R195 Other fecal abnormalities: Secondary | ICD-10-CM

## 2011-11-26 DIAGNOSIS — D649 Anemia, unspecified: Secondary | ICD-10-CM

## 2011-11-26 DIAGNOSIS — I509 Heart failure, unspecified: Secondary | ICD-10-CM

## 2011-11-26 DIAGNOSIS — J449 Chronic obstructive pulmonary disease, unspecified: Secondary | ICD-10-CM

## 2011-11-26 DIAGNOSIS — J4489 Other specified chronic obstructive pulmonary disease: Secondary | ICD-10-CM | POA: Insufficient documentation

## 2011-11-26 DIAGNOSIS — I5022 Chronic systolic (congestive) heart failure: Secondary | ICD-10-CM | POA: Insufficient documentation

## 2011-11-26 DIAGNOSIS — Z7901 Long term (current) use of anticoagulants: Secondary | ICD-10-CM | POA: Insufficient documentation

## 2011-11-26 DIAGNOSIS — E669 Obesity, unspecified: Secondary | ICD-10-CM | POA: Insufficient documentation

## 2011-11-26 DIAGNOSIS — Z8719 Personal history of other diseases of the digestive system: Secondary | ICD-10-CM | POA: Insufficient documentation

## 2011-11-26 DIAGNOSIS — Z125 Encounter for screening for malignant neoplasm of prostate: Secondary | ICD-10-CM

## 2011-11-26 MED ORDER — PEG-KCL-NACL-NASULF-NA ASC-C 100 G PO SOLR: 1.0000 | Freq: Once | ORAL | Status: DC

## 2011-11-26 NOTE — Progress Notes (Signed)
This is a very complicated 71 year old Caucasian male with multiple medical problems again referred for evaluation of iron deficiency anemia and quite positive stools. The patient was hospitalized with a 3 unit transfusion in December, and at that time an exacerbation of his chronic congestive heart failure. He is on oral iron, and hemoglobin yesterday was 10.7 hematocrit of 33. Patient has chronically dark stools, but denies any specific GI complaints. Previous endoscopy, colonoscopy, and small bowel camera exams were unremarkable approximately 1-1/2 years ago. He has multiple cardiovascular issues with previous bypass surgery, is on chronic Coumadin and aspirin, and is on multiple cardiac medications per Dr. Daneen Schick. He has chronic hypothyroidism on therapy, hypertension, asthmatic bronchitis, and chronic arrhythmia problems. Primary care physician is Dr. [name] . Patient denies use of other NSAIDs or anti-inflammatories, cigarette or alcohol use.  Current Medications, Allergies, Past Medical History, Past Surgical History, Family History and Social History were reviewed in [employer] record.  Pertinent Review of Systems Negative.. he currently is well compensated cardiovascular wise, but does have dyspnea with exertion and shortness of breath with exertion. He denies angina,, pain, nausea vomiting, or acid reflux symptoms. He is chronically on Nexium 40 mg a day. He has recurrent palpitations, but I cannot see a history of atrial fibrillation.He also takes inhalers for his asthmatic bronchitis.  Physical Exam: Obese patient in no acute distress appearing his stated age. His blood pressure is 160/58 and pulse is 60 and irregular. He is not short of breath at rest or lying down. I cannot appreciate stigmata of chronic liver disease or thyromegaly. His chest is generally clear without wheezes or rhonchi. His rhythm is somewhat irregular without definite murmurs or rubs. He has  an obese protuberant abdomen with a ventral hernia, but I cannot appreciate organomegaly, masses or tenderness. Bowel sounds are nonobstructive. Rectal exam shows a very hard anterior as somewhat superior to the prostate. Stool is normal color but guaiac positive. There is +1 peripheral edema without evidence of phlebitis or cellulitis. Mental status is normal without gross focal neurological deficits.    Assessment and Plan: Worrisome continued progressive GI blood loss very complex patient with multiple cardiovascular issues chronically on aspirin and Coumadin. I am concerned about his abnormal rectal exam, and have ordered PSA level, also followup colonoscopy and endoscopy on anti-coagulation. We may need to also repeat his small bowel pill camera exam. I will prep him for colonoscopy with a balanced electrolyte solution, sedation with propofol and nurse anesthesia, and continue current medications as listed and reviewed. Encounter Diagnoses  Name Primary?  . Heme positive stool   . Anemia

## 2011-11-26 NOTE — Patient Instructions (Signed)
Your procedure has been scheduled for 11/29/2011, please follow the seperate instructions.  Please go to the basement today for your labs.  Your prescription(s) have been sent to you pharmacy.

## 2011-11-26 NOTE — Telephone Encounter (Signed)
Free unit coupon and rx faxed to [facility] per pt request.

## 2011-11-29 ENCOUNTER — Ambulatory Visit (AMBULATORY_SURGERY_CENTER): Payer: Medicare Other | Admitting: Gastroenterology

## 2011-11-29 ENCOUNTER — Encounter: Payer: Self-pay | Admitting: Gastroenterology

## 2011-11-29 DIAGNOSIS — K2970 Gastritis, unspecified, without bleeding: Secondary | ICD-10-CM

## 2011-11-29 DIAGNOSIS — D649 Anemia, unspecified: Secondary | ICD-10-CM

## 2011-11-29 DIAGNOSIS — Z7901 Long term (current) use of anticoagulants: Secondary | ICD-10-CM

## 2011-11-29 DIAGNOSIS — K2990 Gastroduodenitis, unspecified, without bleeding: Secondary | ICD-10-CM

## 2011-11-29 DIAGNOSIS — K31819 Angiodysplasia of stomach and duodenum without bleeding: Secondary | ICD-10-CM

## 2011-11-29 DIAGNOSIS — R195 Other fecal abnormalities: Secondary | ICD-10-CM

## 2011-11-29 LAB — HM COLONOSCOPY

## 2011-11-29 MED ORDER — SODIUM CHLORIDE 0.9 % IV SOLN: 500.0000 mL | INTRAVENOUS | Status: DC

## 2011-11-29 NOTE — Progress Notes (Signed)
Patient did not experience any of the following events: a burn prior to discharge; a fall within the facility; wrong site/side/patient/procedure/implant event; or a hospital transfer or hospital admission upon discharge from the facility. (G8907) Patient did not have preoperative order for IV antibiotic SSI prophylaxis. (G8918)  

## 2011-11-29 NOTE — Patient Instructions (Addendum)
YOU HAD AN ENDOSCOPIC PROCEDURE TODAY AT THE [facility]: Refer to the procedure report that was given to you for any specific questions about what was found during the examination.  If the procedure report does not answer your questions, please call your gastroenterologist to clarify.  If you requested that your care partner not be given the details of your procedure findings, then the procedure report has been included in a sealed envelope for you to review at your convenience later.  YOU SHOULD EXPECT: Some feelings of bloating in the abdomen. Passage of more gas than usual.  Walking can help get rid of the air that was put into your GI tract during the procedure and reduce the bloating. If you had a lower endoscopy (such as a colonoscopy or flexible sigmoidoscopy) you may notice spotting of blood in your stool or on the toilet paper. If you underwent a bowel prep for your procedure, then you may not have a normal bowel movement for a few days.  DIET: Your first meal following the procedure should be a light meal and then it is ok to progress to your normal diet.  A half-sandwich or bowl of soup is an example of a good first meal.  Heavy or fried foods are harder to digest and may make you feel nauseous or bloated.  Likewise meals heavy in dairy and vegetables can cause extra gas to form and this can also increase the bloating.  Drink plenty of fluids but you should avoid alcoholic beverages for 24 hours.  ACTIVITY: Your care partner should take you home directly after the procedure.  You should plan to take it easy, moving slowly for the rest of the day.  You can resume normal activity the day after the procedure however you should NOT DRIVE or use heavy machinery for 24 hours (because of the sedation medicines used during the test).    SYMPTOMS TO REPORT IMMEDIATELY: A gastroenterologist can be reached at any hour.  During normal business hours, 8:30 AM to 5:00 PM Monday through Friday,  call [phone].  After hours and on weekends, please call the GI answering service at [phone] who will take a message and have the physician on call contact you.   Following lower endoscopy (colonoscopy or flexible sigmoidoscopy):  Excessive amounts of blood in the stool  Significant tenderness or worsening of abdominal pains  Swelling of the abdomen that is new, acute  Fever of 100F or higher  Following upper endoscopy (EGD)  Vomiting of blood or coffee ground material  New chest pain or pain under the shoulder blades  Painful or persistently difficult swallowing  New shortness of breath  Fever of 100F or higher  Black, tarry-looking stools  FOLLOW UP: If any biopsies were taken you will be contacted by phone or by letter within the next 1-3 weeks.  Call your gastroenterologist if you have not heard about the biopsies in 3 weeks.  Our staff will call the home number listed on your records the next business day following your procedure to check on you and address any questions or concerns that you may have at that time regarding the information given to you following your procedure. This is a courtesy call and so if there is no answer at the home number and we have not heard from you through the emergency physician on call, we will assume that you have returned to your regular daily activities without incident.  SIGNATURES/CONFIDENTIALITY: You and/or your care   partner have signed paperwork which will be entered into your electronic medical record.  These signatures attest to the fact that that the information above on your After Visit Summary has been reviewed and is understood.  Full responsibility of the confidentiality of this discharge information lies with you and/or your care-partner.  

## 2011-11-29 NOTE — Progress Notes (Signed)
1 

## 2011-11-29 NOTE — Op Note (Signed)
[address]. [place], [place]  [id number]  COLONOSCOPY PROCEDURE REPORT  PATIENT:  [name] [name], [name] [name]  MR#:  [id number] BIRTHDATE:  [birth year], 70 yrs. old  GENDER:  male ENDOSCOPIST:  Loralee Pacas. Sharlett Iles, MD, [facility] REF. BY:  [name], M.D. Iran Planas, M.D. Daneen Schick, M.D. PROCEDURE DATE:  11/29/2011 PROCEDURE:  Diagnostic Colonoscopy ASA CLASS:  Class III INDICATIONS:  RECURRENT BLEEDING.ON COUMADIN. MEDICATIONS:   propofol (Diprivan), propofol (Diprivan) 250 mg IV  DESCRIPTION OF PROCEDURE:   After the risks and benefits and of the procedure were explained, informed consent was obtained. Digital rectal exam was performed and revealed no abnormalities. The [id number] [id number] endoscope was introduced through the anus and advanced to the cecum, which was identified by both the appendix and ileocecal valve.  The quality of the prep was good, using MoviPrep.  The instrument was then slowly withdrawn as the colon was fully examined. <<PROCEDUREIMAGES>>  FINDINGS:  No polyps or cancers were seen.  no active bleeding or blood in c.  This was otherwise a normal examination of the colon. Retroflexed views in the rectum revealed no abnormalities.    The scope was then withdrawn from the patient and the procedure completed.  COMPLICATIONS:  None ENDOSCOPIC IMPRESSION: 1) No polyps or cancers 2) No active bleeding or blood in c RECOMMENDATIONS: 1) Continue current medications 2) Upper endoscopy will be scheduled  REPEAT EXAM:  No  ______________________________ Loralee Pacas. Sharlett Iles, MD, [facility]  CC:  n. eSIGNED:   Loralee Pacas. Hadasah Brugger at 11/29/2011 03:14 PM  Clemetine Marker, [id number]

## 2011-11-30 ENCOUNTER — Telehealth: Payer: Self-pay

## 2011-11-30 ENCOUNTER — Telehealth: Payer: Self-pay | Admitting: *Deleted

## 2011-11-30 NOTE — Op Note (Signed)
[address] [address]. [place], [place]  [id number]  ENDOSCOPY PROCEDURE REPORT  PATIENT:  [name] [name], [name] [name]  MR#:  [id number] BIRTHDATE:  11/23/1940, 70 yrs. old  GENDER:  male  ENDOSCOPIST:  Loralee Pacas. Sharlett Iles, MD, [facility] Referred by:  [name], M.D. Daneen Schick, M.D.  PROCEDURE DATE:  11/29/2011 PROCEDURE:  EGD with biopsy for H. pylori [id number] ASA CLASS:  Class III INDICATIONS:  RECURRENT ANEMIA.CARDIAC PATIENT ON COUMADIN.NEGATIVE COLONOSCOPY.  MEDICATIONS:   There was residual sedation effect present from prior procedure., propofol (Diprivan) 50 mg IV TOPICAL ANESTHETIC:  DESCRIPTION OF PROCEDURE:   After the risks and benefits of the procedure were explained, informed consent was obtained.  The LB GIF-H180 [id number] endoscope was introduced through the mouth and advanced to the second portion of the duodenum.  The instrument was slowly withdrawn as the mucosa was fully examined. <<PROCEDUREIMAGES>>  Normal duodenal folds were noted.  other findings. 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 DONE.  The esophagus and gastroesophageal junction were completely normal in appearance.    Retroflexed views revealed no masses.    The scope was then withdrawn from the patient and the procedure completed.  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 RECOMMENDATIONS: 1) Await biopsy results REPEAT PILL CAMERA EXAM.CONSIDER ERBE LASER RX OFF COUMADIN AND POSSIBLE LOVEVOX BRIDGE.  ______________________________ Loralee Pacas. Sharlett Iles, MD, [facility]  CC:  n. eSIGNED:   Loralee Pacas. Trennon Torbeck at 11/29/2011 03:24 PM  Clemetine Marker, [id number]

## 2011-11-30 NOTE — Telephone Encounter (Signed)
  Follow up Call-  Call back number 11/29/2011  Post procedure Call Back phone  # [phone]  Permission to leave phone message Yes     Patient questions:  Do you have a fever, pain , or abdominal swelling? no Pain Score  0 *  Have you tolerated food without any problems? yes  Have you been able to return to your normal activities? yes  Do you have any questions about your discharge instructions: Diet   no Medications  no Follow up visit  no  Do you have questions or concerns about your Care? no  Actions: * If pain score is 4 or above: No action needed, pain <4.  The pt's wife said the pt was no available but she said everything went fine. Maw

## 2011-11-30 NOTE — Telephone Encounter (Signed)
lmom for pt to call back. We have not heard from the insurance company for pre cert of his capsule endo.

## 2011-11-30 NOTE — Telephone Encounter (Signed)
Spoke with pt to inform him we haven't heard back from the insurance company and the capsule is >$1700. I discused the procedure and prep with him and he elected to bring his wife when he comes. He requested Mondays for teaching and procedure.

## 2011-12-02 ENCOUNTER — Encounter: Payer: Self-pay | Admitting: Gastroenterology

## 2011-12-06 NOTE — Telephone Encounter (Signed)
Insurance company requested iron studies for review for Capsule Endo pre cert. Sabino Niemann labs faxed from Dr Tawanna Sat [facility] in [place].

## 2011-12-15 NOTE — Telephone Encounter (Signed)
[name] [name] advised me that prior [name] was received for capsule endo. Phoned pt and he is scheduled for teaching on 12/27/11 at 1:30pm. We need to advise [name] when test is scheduled for the prior auth.

## 2011-12-27 ENCOUNTER — Telehealth: Payer: Self-pay | Admitting: *Deleted

## 2011-12-27 NOTE — Telephone Encounter (Signed)
Patient and his wife here for capsule endo teaching. Patient given written and verbal information. Patient verbalizes understanding. Scheduled patient for capsule on 01/05/12.

## 2011-12-29 ENCOUNTER — Telehealth: Payer: Self-pay | Admitting: Gastroenterology

## 2011-12-29 NOTE — Telephone Encounter (Signed)
I can not find where pt needs any labs, Dr Sharlett Iles are there any labs that you would like Dr Georgia Duff office to draw on the patient since he is having some labs done? His capsule endo is scheduled for 01/05/2012?

## 2011-12-29 NOTE — Telephone Encounter (Signed)
CBC

## 2011-12-29 NOTE — Telephone Encounter (Signed)
Patient had a cbc done already they will send a copy to Dr Tomie China attention.

## 2012-01-05 ENCOUNTER — Ambulatory Visit (INDEPENDENT_AMBULATORY_CARE_PROVIDER_SITE_OTHER): Payer: Medicare Other | Admitting: Gastroenterology

## 2012-01-05 DIAGNOSIS — K922 Gastrointestinal hemorrhage, unspecified: Secondary | ICD-10-CM

## 2012-01-05 DIAGNOSIS — D500 Iron deficiency anemia secondary to blood loss (chronic): Secondary | ICD-10-CM

## 2012-01-05 NOTE — Progress Notes (Signed)
Patient tolerated well.  Patient verbalized understanding of all written and verbal instructions.    Lot # [id number]   Exp 02/2013

## 2012-01-12 ENCOUNTER — Encounter: Payer: Self-pay | Admitting: Gastroenterology

## 2012-01-12 ENCOUNTER — Telehealth: Payer: Self-pay | Admitting: Gastroenterology

## 2012-01-12 NOTE — Telephone Encounter (Signed)
Needs endo erbe laser rx..see me first

## 2012-01-12 NOTE — Telephone Encounter (Signed)
Informed pt I think the report is on Dr Buel Ream desk; I will have him look at it and call him; pt stated understanding. Dr Sharlett Iles, report is in your box; please advise.

## 2012-01-13 NOTE — Telephone Encounter (Signed)
lmom for pt to return my call. Spoke with Dr Sharlett Iles who will discuss the procedure with Dr Deatra Ina and see the pt before scheduling.

## 2012-01-13 NOTE — Telephone Encounter (Signed)
Informed pt Dr Sharlett Iles would like to see him to discuss capsule endo findings. Pt stated understanding and will come 01/21/12 at 3pm.

## 2012-01-21 ENCOUNTER — Ambulatory Visit (INDEPENDENT_AMBULATORY_CARE_PROVIDER_SITE_OTHER): Payer: Medicare Other | Admitting: Gastroenterology

## 2012-01-21 ENCOUNTER — Encounter: Payer: Self-pay | Admitting: Gastroenterology

## 2012-01-21 ENCOUNTER — Other Ambulatory Visit (INDEPENDENT_AMBULATORY_CARE_PROVIDER_SITE_OTHER): Payer: Medicare Other

## 2012-01-21 VITALS — BP 154/48 | HR 76 | Ht 70.0 in | Wt 237.2 lb

## 2012-01-21 DIAGNOSIS — I509 Heart failure, unspecified: Secondary | ICD-10-CM

## 2012-01-21 DIAGNOSIS — R109 Unspecified abdominal pain: Secondary | ICD-10-CM

## 2012-01-21 DIAGNOSIS — I4891 Unspecified atrial fibrillation: Secondary | ICD-10-CM

## 2012-01-21 DIAGNOSIS — D649 Anemia, unspecified: Secondary | ICD-10-CM

## 2012-01-21 DIAGNOSIS — K5520 Angiodysplasia of colon without hemorrhage: Secondary | ICD-10-CM

## 2012-01-21 DIAGNOSIS — Q2733 Arteriovenous malformation of digestive system vessel: Secondary | ICD-10-CM

## 2012-01-21 DIAGNOSIS — I480 Paroxysmal atrial fibrillation: Secondary | ICD-10-CM

## 2012-01-21 LAB — HEMOGLOBIN: Hemoglobin: 9.4 g/dL — ABNORMAL LOW (ref 13.0–17.0)

## 2012-01-21 LAB — HEMATOCRIT: HCT: 29.7 % — ABNORMAL LOW (ref 39.0–52.0)

## 2012-01-21 MED ORDER — INTEGRA F 125-1 MG PO CAPS: ORAL_CAPSULE | ORAL | Status: DC

## 2012-01-21 MED ORDER — ALIGN PO CAPS: 1.0000 | ORAL_CAPSULE | Freq: Every day | ORAL | Status: AC

## 2012-01-21 NOTE — Patient Instructions (Signed)
Increase your Lasix to one tablet once a day.  Stop your Coumadin. Take Align once a day, samples given once they are gone you can buy it OTC.  Increase your Integra to once tablet by mouth twice a day.  Have a HGB done once a week at [facility], order has been faxed over to the office already. We will call you about scheduling a blood transfusion based on your HGB level, if you feel like your getting short of breath please call back to schedule it sooner.

## 2012-01-21 NOTE — Progress Notes (Signed)
This is a 71 year old obese Caucasian male with severe COPD, coronary artery disease with previous bypass surgery, atrial febrile a, and chronic anticoagulation. For the last 2 years she's had recurrent iron deficiency anemia of unexplained etiology, and has required several transfusions, increased bleeding over the last several months. In February endoscopy suggested possible scattered antral vascular ectasias, and small bowel camera exam also showed scattered AVMs. He is on Coumadin and aspirin and oral iron replacement therapy. His GI symptoms are chronic in revolve around abdominal gas, bloating, and diffuse discomfort without change in bowel habits, anorexia, weight loss, or history of hepatitis or pancreatitis. He denies abuse of alcohol or cigarettes. Currently he has increasing shortness of breath and peripheral edema. Hemoglobin one month ago was 9.9.  Current Medications, Allergies, Past Medical History, Past Surgical History, Family History and Social History were reviewed in [employer] record.  Pertinent Review of Systems Negative... he denies palpitations, angina, claudication or leg pain ringing mental status changes. He has not had previous CVAs or TIAs. His GFR has been calculated approximately 50 cc per minute. Previous liver function tests have been unremarkable. He is status post repair of an umbilical hernia by Dr. Mertha Finders many years ago. Recent colonoscopy was unremarkable, but he has a past history of colon polyps that have been removed endoscopically.   Physical Exam: He is in no acute distress appearing his stated age. Blood pressure today 154/48, pulse 76, weight 237 pounds, BMI 34.04. I cannot appreciate stigmata of chronic liver disease. His chest is generally clear without wheezes or rhonchi. His heart exam shows a slightly irregular rhythm but no murmurs gallops or rubs or S3 sounds. He has a very protuberant and obese abdomen without definite  organomegaly, masses or tenderness. Bowel sounds are nonobstructive. There is +1 peripheral edema noted. Mental status clear. 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 I cannot appreciate a definite umbilical herniation or evidence of incarceration. Rectal exam was deferred.    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 I spoke with Dr. Daneen Schick today concerning this patient's anticoagulation, and we have agreed to stop his Coumadin while continuing aspirin 81 mg a day. I discussed this with the patient and his wife. He will continue on by mouth oral iron replacement, and if he continues to lose blood off Coumadin, he will need referral to [facility] for consideration of enteroscopy and endoscopic coagulation of his vascular telangiectasias. His vague abdominal pain is chronic in nature, and in part related to his multiple medications and COPD. He did have an umbilical hernia repair surgery years ago. I have placed him on Align probiotic therapy andl also will increase his Lasix to 20 mg twice a day, and consider future abdominal CT scanning to exclude recurrent umbilical herniation. Hemoglobin today is 9.4 with hematocrit 29.7. I've increased his iron to twice a day dosage, we will check his hemoglobin and hematocrit weekly for the next month, and consider transfusion if his shortness of breath worsens.  Please copy her primary care physician, referring physician, and pertinent subspecialists... Dr. Daneen Schick and Dr. [name] in [place]. Encounter Diagnosis  Name Primary?  [name] [name] Anemia Yes

## 2012-10-26 ENCOUNTER — Other Ambulatory Visit: Payer: Self-pay | Admitting: Family Medicine

## 2012-10-26 DIAGNOSIS — R531 Weakness: Secondary | ICD-10-CM

## 2012-10-26 DIAGNOSIS — G459 Transient cerebral ischemic attack, unspecified: Secondary | ICD-10-CM

## 2012-10-27 ENCOUNTER — Ambulatory Visit (HOSPITAL_COMMUNITY)
Admission: RE | Admit: 2012-10-27 | Discharge: 2012-10-27 | Disposition: A | Discharge status: Home / Self Care | Payer: Medicare Other | Source: Ambulatory Visit | Attending: Family Medicine | Admitting: Family Medicine

## 2012-10-27 ENCOUNTER — Encounter (HOSPITAL_COMMUNITY): Payer: Self-pay

## 2012-10-27 DIAGNOSIS — R531 Weakness: Secondary | ICD-10-CM

## 2012-10-27 DIAGNOSIS — R5381 Other malaise: Secondary | ICD-10-CM | POA: Insufficient documentation

## 2012-10-27 DIAGNOSIS — G459 Transient cerebral ischemic attack, unspecified: Secondary | ICD-10-CM | POA: Insufficient documentation

## 2012-10-27 DIAGNOSIS — R5383 Other fatigue: Secondary | ICD-10-CM | POA: Insufficient documentation

## 2012-12-16 ENCOUNTER — Encounter: Payer: Self-pay | Admitting: Family Medicine

## 2012-12-16 DIAGNOSIS — K31819 Angiodysplasia of stomach and duodenum without bleeding: Secondary | ICD-10-CM | POA: Insufficient documentation

## 2012-12-16 DIAGNOSIS — M109 Gout, unspecified: Secondary | ICD-10-CM | POA: Insufficient documentation

## 2012-12-16 DIAGNOSIS — E785 Hyperlipidemia, unspecified: Secondary | ICD-10-CM | POA: Insufficient documentation

## 2012-12-21 ENCOUNTER — Ambulatory Visit: Payer: Self-pay | Admitting: Pharmacist

## 2012-12-21 ENCOUNTER — Ambulatory Visit (INDEPENDENT_AMBULATORY_CARE_PROVIDER_SITE_OTHER): Payer: Medicare Other | Admitting: Pharmacist

## 2012-12-21 DIAGNOSIS — I4891 Unspecified atrial fibrillation: Secondary | ICD-10-CM

## 2012-12-21 LAB — POCT INR: INR: 1.6

## 2012-12-21 MED ORDER — WARFARIN SODIUM 5 MG PO TABS: 5.0000 mg | ORAL_TABLET | Freq: Every day | ORAL | Status: DC

## 2012-12-21 NOTE — Addendum Note (Signed)
Addended by: Cherre Robins on: 12/21/2012 03:35 PM   Modules accepted: Orders

## 2012-12-26 ENCOUNTER — Ambulatory Visit: Payer: Self-pay | Admitting: Family Medicine

## 2013-01-16 ENCOUNTER — Ambulatory Visit (INDEPENDENT_AMBULATORY_CARE_PROVIDER_SITE_OTHER): Payer: Medicare Other | Admitting: Pharmacist Clinician (PhC)/ Clinical Pharmacy Specialist

## 2013-01-16 ENCOUNTER — Telehealth: Payer: Self-pay | Admitting: Family Medicine

## 2013-01-16 DIAGNOSIS — I482 Chronic atrial fibrillation, unspecified: Secondary | ICD-10-CM | POA: Insufficient documentation

## 2013-01-16 DIAGNOSIS — E039 Hypothyroidism, unspecified: Secondary | ICD-10-CM

## 2013-01-16 DIAGNOSIS — I4891 Unspecified atrial fibrillation: Secondary | ICD-10-CM

## 2013-01-16 DIAGNOSIS — R7989 Other specified abnormal findings of blood chemistry: Secondary | ICD-10-CM

## 2013-01-16 LAB — POCT CBC
HCT, POC: 41.2 % — AB (ref 43.5–53.7)
Lymph, poc: 2 (ref 0.6–3.4)
MCH, POC: 32.1 pg — AB (ref 27–31.2)
MCHC: 32.8 g/dL (ref 31.8–35.4)
MCV: 98 fL — AB (ref 80–97)
Platelet Count, POC: 295 10*3/uL (ref 142–424)
RDW, POC: 14 %
WBC: 10.6 10*3/uL — AB (ref 4.6–10.2)

## 2013-01-16 LAB — POCT INR: INR: 1.4

## 2013-01-30 ENCOUNTER — Ambulatory Visit (INDEPENDENT_AMBULATORY_CARE_PROVIDER_SITE_OTHER): Payer: Medicare Other | Admitting: Pharmacist Clinician (PhC)/ Clinical Pharmacy Specialist

## 2013-01-30 DIAGNOSIS — I4891 Unspecified atrial fibrillation: Secondary | ICD-10-CM

## 2013-01-30 LAB — POCT INR: INR: 1.8

## 2013-02-16 NOTE — Telephone Encounter (Signed)
error 

## 2013-02-28 ENCOUNTER — Ambulatory Visit (INDEPENDENT_AMBULATORY_CARE_PROVIDER_SITE_OTHER): Payer: Medicare Other | Admitting: Pharmacist

## 2013-02-28 DIAGNOSIS — I4891 Unspecified atrial fibrillation: Secondary | ICD-10-CM

## 2013-02-28 LAB — POCT INR: INR: 2.2

## 2013-02-28 MED ORDER — OMEPRAZOLE 40 MG PO CPDR: 40.0000 mg | DELAYED_RELEASE_CAPSULE | Freq: Every day | ORAL | Status: DC

## 2013-03-02 ENCOUNTER — Encounter: Payer: Self-pay | Admitting: Nurse Practitioner

## 2013-03-02 ENCOUNTER — Ambulatory Visit (INDEPENDENT_AMBULATORY_CARE_PROVIDER_SITE_OTHER): Payer: Medicare Other | Admitting: Nurse Practitioner

## 2013-03-02 VITALS — BP 167/57 | HR 62 | Temp 97.7°F | Ht 70.0 in | Wt 225.0 lb

## 2013-03-02 DIAGNOSIS — J309 Allergic rhinitis, unspecified: Secondary | ICD-10-CM

## 2013-03-02 DIAGNOSIS — H6520 Chronic serous otitis media, unspecified ear: Secondary | ICD-10-CM

## 2013-03-02 MED ORDER — FLUTICASONE PROPIONATE 50 MCG/ACT NA SUSP: 2.0000 | Freq: Every day | NASAL | Status: DC

## 2013-03-02 MED ORDER — CETIRIZINE HCL 10 MG PO TABS: 10.0000 mg | ORAL_TABLET | Freq: Every day | ORAL | Status: DC

## 2013-03-02 MED ORDER — OMEPRAZOLE 40 MG PO CPDR: 40.0000 mg | DELAYED_RELEASE_CAPSULE | Freq: Every day | ORAL | Status: DC

## 2013-03-02 NOTE — Addendum Note (Signed)
Addended by: Chevis Pretty on: 03/02/2013 04:13 PM   Modules accepted: Orders

## 2013-03-02 NOTE — Progress Notes (Signed)
  Subjective:    Patient ID: [name] [name], male    DOB: 28-Nov-1940, 72 y.o.   MRN: [id number]  HPI  Patient  In C/o ears feeling stopped up- wants them washed out- hearing seems to have decereased some.    Review of Systems  Constitutional: Negative for fever and chills.  HENT: Negative for ear pain, tinnitus and ear discharge.   Respiratory: Negative.   Cardiovascular: Negative.   All other systems reviewed and are negative.       Objective:   Physical Exam  Constitutional: He is oriented to person, place, and time. He appears well-developed and well-nourished.  HENT:  Right Ear: Tympanic membrane is bulging. Tympanic membrane is not injected and not erythematous. A middle ear effusion (clear) is present.  Left Ear: Tympanic membrane is bulging. Tympanic membrane is not injected and not erythematous. Left ear middle ear effusion: clear.  Nose: Mucosal edema and rhinorrhea present.  Mouth/Throat: No posterior oropharyngeal erythema.  Cardiovascular: Normal rate and normal heart sounds.   Pulmonary/Chest: Effort normal and breath sounds normal.  Neurological: He is alert and oriented to person, place, and time.  Psychiatric: He has a normal mood and affect. His behavior is normal. Judgment and thought content normal.   BP 167/57  Pulse 62  Temp(Src) 97.7 F (36.5 C) (Oral)  Ht 5\' 10"  (1.778 m)  Wt 225 lb (102.059 kg)  BMI 32.28 kg/m2        Assessment & Plan:   1. Allergic rhinitis   2. Chronic serous OM (otitis media), unspecified laterality    Meds ordered this encounter  Medications  . fluticasone (FLONASE) 50 MCG/ACT nasal spray    Sig: Place 2 sprays into the nose daily.    Dispense:  16 g    Refill:  6    Order Specific Question:  Supervising Provider    Answer:  Chipper Herb [[id number]]  . cetirizine (ZYRTEC) 10 MG tablet    Sig: Take 1 tablet (10 mg total) by mouth daily.    Dispense:  30 tablet    Refill:  11    Order Specific Question:  Supervising  Provider    Answer:  Chipper Herb [[id number]]   Force fluids Avoid alergens  Mary-Margaret Hassell Done, FNP

## 2013-03-02 NOTE — Patient Instructions (Signed)
Allergic Rhinitis Allergic rhinitis is when the mucous membranes in the nose respond to allergens. Allergens are particles in the air that cause your body to have an allergic reaction. This causes you to release allergic antibodies. Through a chain of events, these eventually cause you to release histamine into the blood stream (hence the use of antihistamines). Although meant to be protective to the body, it is this release that causes your discomfort, such as frequent sneezing, congestion and an itchy runny nose.  CAUSES  The pollen allergens may come from grasses, trees, and weeds. This is seasonal allergic rhinitis, or "hay fever." Other allergens cause year-round allergic rhinitis (perennial allergic rhinitis) such as house dust mite allergen, pet dander and mold spores.  SYMPTOMS   Nasal stuffiness (congestion).  Runny, itchy nose with sneezing and tearing of the eyes.  There is often an itching of the mouth, eyes and ears. It cannot be cured, but it can be controlled with medications. DIAGNOSIS  If you are unable to determine the offending allergen, skin or blood testing may find it. TREATMENT   Avoid the allergen.  Medications and allergy shots (immunotherapy) can help.  Hay fever may often be treated with antihistamines in pill or nasal spray forms. Antihistamines block the effects of histamine. There are over-the-counter medicines that may help with nasal congestion and swelling around the eyes. Check with your caregiver before taking or giving this medicine. If the treatment above does not work, there are many new medications your caregiver can prescribe. Stronger medications may be used if initial measures are ineffective. Desensitizing injections can be used if medications and avoidance fails. Desensitization is when a patient is given ongoing shots until the body becomes less sensitive to the allergen. Make sure you follow up with your caregiver if problems continue. SEEK MEDICAL  CARE IF:   You develop fever (more than 100.5 F (38.1 C).  You develop a cough that does not stop easily (persistent).  You have shortness of breath.  You start wheezing.  Symptoms interfere with normal daily activities. Document Released: 06/15/2001 Document Revised: 12/13/2011 Document Reviewed: 12/25/2008 Exit[employer] Patient Information 2014 [employer].  

## 2013-03-05 ENCOUNTER — Other Ambulatory Visit: Payer: Self-pay

## 2013-03-05 MED ORDER — LEVOTHYROXINE SODIUM 100 MCG PO TABS: 100.0000 ug | ORAL_TABLET | Freq: Every day | ORAL | Status: DC

## 2013-03-23 ENCOUNTER — Other Ambulatory Visit: Payer: Self-pay | Admitting: Gastroenterology

## 2013-04-12 ENCOUNTER — Ambulatory Visit (INDEPENDENT_AMBULATORY_CARE_PROVIDER_SITE_OTHER): Payer: Medicare Other | Admitting: Pharmacist

## 2013-04-12 DIAGNOSIS — I4891 Unspecified atrial fibrillation: Secondary | ICD-10-CM

## 2013-04-12 LAB — POCT INR: INR: 2.2

## 2013-04-12 NOTE — Patient Instructions (Addendum)
Anticoagulation Dose Instructions as of 04/12/2013     [name] [name] Tue Wed Thu Fri Sat   New Dose 5 mg 2.5 mg 5 mg 5 mg 5 mg 2.5 mg 5 mg    Description       Goal INR 1.8-2.0 Hold tomorrow, then resume 1/2 tablet on Mondays and Fridays and 1 tablet all other days.        INR was 2.2 today

## 2013-05-21 ENCOUNTER — Other Ambulatory Visit: Payer: Self-pay | Admitting: Family Medicine

## 2013-05-22 ENCOUNTER — Telehealth: Payer: Self-pay | Admitting: Nurse Practitioner

## 2013-05-22 NOTE — Telephone Encounter (Signed)
LAST PT 04/12/13. HAS APPT 05/29/13 WITH PHARMACIST

## 2013-05-22 NOTE — Telephone Encounter (Signed)
REFILL REQUEST SENT TO DR. Laurance Flatten.

## 2013-05-29 ENCOUNTER — Ambulatory Visit (INDEPENDENT_AMBULATORY_CARE_PROVIDER_SITE_OTHER): Payer: Medicare Other | Admitting: Pharmacist Clinician (PhC)/ Clinical Pharmacy Specialist

## 2013-05-29 DIAGNOSIS — I4891 Unspecified atrial fibrillation: Secondary | ICD-10-CM

## 2013-05-29 LAB — POCT INR: INR: 2.3

## 2013-05-29 NOTE — Progress Notes (Signed)
Patient came in with orders from [place] patient sees Dr.Wong in clinic

## 2013-05-30 LAB — CBC WITH DIFFERENTIAL
Basophils Absolute: 0 10*3/uL (ref 0.0–0.2)
Eosinophils Absolute: 0.3 10*3/uL (ref 0.0–0.4)
HCT: 41.5 % (ref 37.5–51.0)
Lymphocytes Absolute: 1.9 10*3/uL (ref 0.7–3.1)
MCHC: 34.2 g/dL (ref 31.5–35.7)
MCV: 95 fL (ref 79–97)
Monocytes Absolute: 1 10*3/uL — ABNORMAL HIGH (ref 0.1–0.9)
Neutrophils Absolute: 8 10*3/uL — ABNORMAL HIGH (ref 1.4–7.0)
RDW: 13.8 % (ref 12.3–15.4)

## 2013-07-04 ENCOUNTER — Telehealth: Payer: Self-pay

## 2013-07-04 ENCOUNTER — Telehealth: Payer: Self-pay | Admitting: Interventional Cardiology

## 2013-07-04 DIAGNOSIS — I10 Essential (primary) hypertension: Secondary | ICD-10-CM

## 2013-07-04 DIAGNOSIS — I5022 Chronic systolic (congestive) heart failure: Secondary | ICD-10-CM

## 2013-07-04 MED ORDER — VALSARTAN 160 MG PO TABS: 160.0000 mg | ORAL_TABLET | Freq: Every day | ORAL | Status: DC

## 2013-07-04 NOTE — Telephone Encounter (Signed)
New Problem:  Pt states Dr. Tamala Julian changed his BP meds on Monday. Pt states this morning his BP was 180/65 and heart rate 85.Marland Kitchen Please advise. Pt states he would like his medication changed back Diovan 160mg .

## 2013-07-04 NOTE — Telephone Encounter (Signed)
Called pt regarding elevvated bp.pt was taken off Diovan and would like to resume taking. Ok  To resume Diovan 160mg  per Dr.smith.pt to reduce spironolactone to 25mg  daily.pt to have bmet in 1 week and f/u with Dr.smith in 2 weeks. Advised pt I would call back with an appt time for 07/18/13 visit. Pt verbalized understanding and repeated directions back to me.

## 2013-07-04 NOTE — Telephone Encounter (Signed)
Pt states his bp has been elevated 190-170/ 45-65, p 80's Pt states he feels over all not well/' blah" Pt requests to go back to Diovan 160 mg/ Dr [name] stopped it due to diastolic being low per pts recollection. Pt has been of fit for 8-9 months. Current 181/67 p87 MAR was reviewed and updated/ bp meds are current and being taken as prescribed.  Please advise

## 2013-07-04 NOTE — Telephone Encounter (Signed)
Called pt to notify of appt with Dr.Smith on 07/18/13 @9 :30

## 2013-07-09 ENCOUNTER — Ambulatory Visit (INDEPENDENT_AMBULATORY_CARE_PROVIDER_SITE_OTHER): Payer: Medicare Other | Admitting: Pharmacist

## 2013-07-09 DIAGNOSIS — I4891 Unspecified atrial fibrillation: Secondary | ICD-10-CM

## 2013-07-09 LAB — POCT INR: INR: 2.8

## 2013-07-09 NOTE — Patient Instructions (Signed)
Anticoagulation Dose Instructions as of 07/09/2013     Sun Mon Tue Wed Thu Fri Sat   New Dose 2.5 mg 5 mg 2.5 mg 5 mg 2.5 mg 5 mg 2.5 mg    Description       Goal INR 1.8-2.0 Hold for 1 dose, then decrease to 1/2 tablet daily except 1 tablet Mondays, Wednesdays and Fridays.      INR was 2.8 today

## 2013-07-11 ENCOUNTER — Ambulatory Visit (INDEPENDENT_AMBULATORY_CARE_PROVIDER_SITE_OTHER): Payer: Medicare Other | Admitting: *Deleted

## 2013-07-11 ENCOUNTER — Telehealth: Payer: Self-pay

## 2013-07-11 DIAGNOSIS — I5022 Chronic systolic (congestive) heart failure: Secondary | ICD-10-CM

## 2013-07-11 LAB — BASIC METABOLIC PANEL
BUN: 31 mg/dL — ABNORMAL HIGH (ref 6–23)
CO2: 26 mEq/L (ref 19–32)
Calcium: 9.2 mg/dL (ref 8.4–10.5)
Creatinine, Ser: 1.5 mg/dL (ref 0.4–1.5)
Glucose, Bld: 108 mg/dL — ABNORMAL HIGH (ref 70–99)

## 2013-07-11 NOTE — Telephone Encounter (Signed)
adv pt labs ok. pt verbalized undersatnding

## 2013-07-16 ENCOUNTER — Ambulatory Visit (INDEPENDENT_AMBULATORY_CARE_PROVIDER_SITE_OTHER): Payer: Medicare Other | Admitting: Family Medicine

## 2013-07-16 ENCOUNTER — Ambulatory Visit (INDEPENDENT_AMBULATORY_CARE_PROVIDER_SITE_OTHER): Payer: Medicare Other

## 2013-07-16 ENCOUNTER — Encounter: Payer: Self-pay | Admitting: Family Medicine

## 2013-07-16 ENCOUNTER — Encounter (INDEPENDENT_AMBULATORY_CARE_PROVIDER_SITE_OTHER): Payer: Self-pay

## 2013-07-16 VITALS — BP 179/67 | HR 80 | Temp 99.4°F | Ht 69.5 in | Wt 227.8 lb

## 2013-07-16 DIAGNOSIS — E785 Hyperlipidemia, unspecified: Secondary | ICD-10-CM

## 2013-07-16 DIAGNOSIS — J449 Chronic obstructive pulmonary disease, unspecified: Secondary | ICD-10-CM

## 2013-07-16 DIAGNOSIS — I4891 Unspecified atrial fibrillation: Secondary | ICD-10-CM

## 2013-07-16 DIAGNOSIS — R0989 Other specified symptoms and signs involving the circulatory and respiratory systems: Secondary | ICD-10-CM

## 2013-07-16 DIAGNOSIS — I509 Heart failure, unspecified: Secondary | ICD-10-CM

## 2013-07-16 DIAGNOSIS — R531 Weakness: Secondary | ICD-10-CM

## 2013-07-16 DIAGNOSIS — I10 Essential (primary) hypertension: Secondary | ICD-10-CM

## 2013-07-16 DIAGNOSIS — Z7901 Long term (current) use of anticoagulants: Secondary | ICD-10-CM

## 2013-07-16 DIAGNOSIS — K219 Gastro-esophageal reflux disease without esophagitis: Secondary | ICD-10-CM

## 2013-07-16 DIAGNOSIS — J4489 Other specified chronic obstructive pulmonary disease: Secondary | ICD-10-CM

## 2013-07-16 DIAGNOSIS — R5383 Other fatigue: Secondary | ICD-10-CM

## 2013-07-16 DIAGNOSIS — D509 Iron deficiency anemia, unspecified: Secondary | ICD-10-CM

## 2013-07-16 DIAGNOSIS — R6883 Chills (without fever): Secondary | ICD-10-CM

## 2013-07-16 DIAGNOSIS — E669 Obesity, unspecified: Secondary | ICD-10-CM

## 2013-07-16 DIAGNOSIS — R160 Hepatomegaly, not elsewhere classified: Secondary | ICD-10-CM

## 2013-07-16 DIAGNOSIS — R5381 Other malaise: Secondary | ICD-10-CM

## 2013-07-16 DIAGNOSIS — K31819 Angiodysplasia of stomach and duodenum without bleeding: Secondary | ICD-10-CM

## 2013-07-16 LAB — POCT CBC
Granulocyte percent: 87.1 %G — AB (ref 37–80)
HCT, POC: 43.8 % (ref 43.5–53.7)
Hemoglobin: 14.8 g/dL (ref 14.1–18.1)
Lymph, poc: 0.9 (ref 0.6–3.4)
MCH, POC: 32.6 pg — AB (ref 27–31.2)
MCHC: 33.8 g/dL (ref 31.8–35.4)
MCV: 96.5 fL (ref 80–97)
MPV: 8 fL (ref 0–99.8)
POC Granulocyte: 9.7 — AB (ref 2–6.9)
POC LYMPH PERCENT: 7.8 %L — AB (ref 10–50)
Platelet Count, POC: 280 10*3/uL (ref 142–424)
RBC: 4.5 M/uL — AB (ref 4.69–6.13)
RDW, POC: 12.4 %
WBC: 11.1 10*3/uL — AB (ref 4.6–10.2)

## 2013-07-16 LAB — POCT INFLUENZA A/B
Influenza A, POC: NEGATIVE
Influenza B, POC: NEGATIVE

## 2013-07-16 LAB — POCT URINALYSIS DIPSTICK
Bilirubin, UA: NEGATIVE
Blood, UA: NEGATIVE
Glucose, UA: NEGATIVE
Ketones, UA: NEGATIVE
Leukocytes, UA: NEGATIVE
Nitrite, UA: NEGATIVE
Protein, UA: NEGATIVE
Spec Grav, UA: <=1.005
Urobilinogen, UA: NEGATIVE
pH, UA: 5

## 2013-07-16 LAB — POCT UA - MICROSCOPIC ONLY
Bacteria, U Microscopic: NEGATIVE
Casts, Ur, LPF, POC: NEGATIVE
Crystals, Ur, HPF, POC: NEGATIVE
Mucus, UA: NEGATIVE
RBC, urine, microscopic: NEGATIVE
WBC, Ur, HPF, POC: NEGATIVE
Yeast, UA: NEGATIVE

## 2013-07-16 MED ORDER — AMOXICILLIN 875 MG PO TABS: 875.0000 mg | ORAL_TABLET | Freq: Two times a day (BID) | ORAL | Status: DC

## 2013-07-16 NOTE — Progress Notes (Signed)
Patient ID: [name] [name], male   DOB: 1941/03/25, 72 y.o.   MRN: [id number] SUBJECTIVE: CC: Chief Complaint  Patient presents with  . Follow-up    follow up says dont feel good wants hemocult done  and chest x r states has congestion.     HPI: Feels weak. Nothing hurts. Congested in the chest feels like he has the flu. Chills a little. Felt cold in the house and his wife says the house was hot.no other symptoms.  Past Medical History  Diagnosis Date  . Unspecified essential hypertension   . Anemia   . Asthma   . Hepatomegaly   . Esophageal reflux   . Other and unspecified coagulation defects   . Hiatal hernia   . Personal history of colonic polyps 05/29/2010    TUBULAR ADENOMA  . CAD (coronary artery disease)   . Hypothyroidism   . Gout   . Hyperlipidemia   . Gastric antral vascular ectasia 2013   Past Surgical History  Procedure Laterality Date  . Coronary artery bypass graft    . Coronary angioplasty with stent placement    . Umbilical hernia repair     History   Social History  . Marital Status: Married    Spouse Name: N/A    Number of Children: N/A  . Years of Education: N/A   Occupational History  . retired    Social History Main Topics  . Smoking status: Former Smoker    Types: Cigarettes    Quit date: 12/16/1996  . Smokeless tobacco: Never Used  . Alcohol Use: Yes     Comment: occasional  . Drug Use: No  . Sexual Activity: Not on file   Other Topics Concern  . Not on file   Social History Narrative  . No narrative on file   Family History  Problem Relation Age of Onset  . Stomach cancer Sister   . Leukemia Mother   . Colon cancer Neg Hx    Current Outpatient Prescriptions on File Prior to Visit  Medication Sig Dispense Refill  . acetaminophen (TYLENOL) 500 MG tablet Take 1,000 mg by mouth every 6 (six) hours as needed. For pain.       Marland Kitchen albuterol (PROVENTIL HFA;VENTOLIN HFA) 108 (90 BASE) MCG/ACT inhaler Inhale 2 puffs into the lungs every  6 (six) hours as needed. For shortness of breath.       . budesonide (PULMICORT) 0.25 MG/2ML nebulizer solution Take 0.25 mg by nebulization 2 (two) times daily.       . cetirizine (ZYRTEC) 10 MG tablet Take 1 tablet (10 mg total) by mouth daily.  30 tablet  11  . Cholecalciferol (VITAMIN D3) 5000 UNITS CAPS Take 1 capsule by mouth daily.        . ferrous sulfate 325 (65 FE) MG tablet Take 325 mg by mouth daily with breakfast.      . fluticasone (FLONASE) 50 MCG/ACT nasal spray Place 2 sprays into the nose daily.  16 g  6  . furosemide (LASIX) 40 MG tablet Take 1 tablet (40 mg total) by mouth daily.  30 tablet    . ipratropium-albuterol (DUONEB) 0.5-2.5 (3) MG/3ML SOLN Take 3 mLs by nebulization every 6 (six) hours as needed. For shortness of breath.       . isosorbide-hydrALAZINE (BIDIL) 20-37.5 MG per tablet Take 1 tablet by mouth 2 (two) times daily.      Marland Kitchen levothyroxine (SYNTHROID, LEVOTHROID) 100 MCG tablet Take 1 tablet (100 mcg total)  by mouth daily.  30 tablet  10  . lovastatin (MEVACOR) 40 MG tablet Take 40 mg by mouth at bedtime.        . Multiple Vitamins-Minerals (MULTIVITAMINS THER. W/MINERALS) TABS Take 1 tablet by mouth daily.        Marland Kitchen omeprazole (PRILOSEC) 40 MG capsule Take 1 capsule (40 mg total) by mouth daily.  90 capsule  1  . spironolactone (ALDACTONE) 25 MG tablet Take 1 tablet (25 mg total) by mouth 2 (two) times daily.      . valsartan (DIOVAN) 160 MG tablet Take 1 tablet (160 mg total) by mouth daily.  30 tablet  5  . diltiazem (TIAZAC) 300 MG 24 hr capsule Take 300 mg by mouth daily.        . Fe Fum-FePoly-FA-Vit C-Vit B3 (INTEGRA F) 125-1 MG CAPS TAKE ONE TABLET BY MOUTH TWICE A DAY  60 capsule  4  . nitroGLYCERIN (NITROSTAT) 0.4 MG SL tablet Place 0.4 mg under the tongue every 5 (five) minutes as needed for chest pain.      Marland Kitchen warfarin (COUMADIN) 5 MG tablet TAKE 1 TABLET BY MOUTH EVERY DAY OR AS DIRECTED BY ANTICOAGULATION CLINIC  30 tablet  2   No current  facility-administered medications on file prior to visit.   No Known Allergies Immunization History  Administered Date(s) Administered  . Influenza Whole 11/18/2010   Prior to Admission medications   Medication Sig Start Date End Date Taking? Authorizing Provider  acetaminophen (TYLENOL) 500 MG tablet Take 1,000 mg by mouth every 6 (six) hours as needed. For pain.    Yes Historical Provider, MD  albuterol (PROVENTIL HFA;VENTOLIN HFA) 108 (90 BASE) MCG/ACT inhaler Inhale 2 puffs into the lungs every 6 (six) hours as needed. For shortness of breath.    Yes Historical Provider, MD  budesonide (PULMICORT) 0.25 MG/2ML nebulizer solution Take 0.25 mg by nebulization 2 (two) times daily.    Yes Historical Provider, MD  cetirizine (ZYRTEC) 10 MG tablet Take 1 tablet (10 mg total) by mouth daily. 03/02/13  Yes Mary-Margaret Hassell Done, FNP  Cholecalciferol (VITAMIN D3) 5000 UNITS CAPS Take 1 capsule by mouth daily.     Yes Historical Provider, MD  ferrous sulfate 325 (65 FE) MG tablet Take 325 mg by mouth daily with breakfast.   Yes Historical Provider, MD  fluticasone (FLONASE) 50 MCG/ACT nasal spray Place 2 sprays into the nose daily. 03/02/13  Yes Mary-Margaret Hassell Done, FNP  furosemide (LASIX) 40 MG tablet Take 1 tablet (40 mg total) by mouth daily. 07/04/13  Yes Belva Crome III, MD  ipratropium-albuterol (DUONEB) 0.5-2.5 (3) MG/3ML SOLN Take 3 mLs by nebulization every 6 (six) hours as needed. For shortness of breath.    Yes Historical Provider, MD  isosorbide-hydrALAZINE (BIDIL) 20-37.5 MG per tablet Take 1 tablet by mouth 2 (two) times daily. 07/04/13  Yes Belva Crome III, MD  levothyroxine (SYNTHROID, LEVOTHROID) 100 MCG tablet Take 1 tablet (100 mcg total) by mouth daily. 03/05/13  Yes Chipper Herb, MD  lovastatin (MEVACOR) 40 MG tablet Take 40 mg by mouth at bedtime.     Yes Historical Provider, MD  Multiple Vitamins-Minerals (MULTIVITAMINS THER. W/MINERALS) TABS Take 1 tablet by mouth daily.     Yes  Historical Provider, MD  omeprazole (PRILOSEC) 40 MG capsule Take 1 capsule (40 mg total) by mouth daily. 03/02/13  Yes Mary-Margaret Hassell Done, FNP  spironolactone (ALDACTONE) 25 MG tablet Take 1 tablet (25 mg total) by mouth 2 (two)  times daily. 07/04/13  Yes Belva Crome III, MD  valsartan (DIOVAN) 160 MG tablet Take 1 tablet (160 mg total) by mouth daily. 07/04/13  Yes Belva Crome III, MD  diltiazem [facility]) 300 MG 24 hr capsule Take 300 mg by mouth daily.      Historical Provider, MD  Fe Fum-FePoly-FA-Vit C-Vit B3 (INTEGRA F) 125-1 MG CAPS TAKE ONE TABLET BY MOUTH TWICE A DAY 03/23/13   Sable Feil, MD  nitroGLYCERIN (NITROSTAT) 0.4 MG SL tablet Place 0.4 mg under the tongue every 5 (five) minutes as needed for chest pain.    Historical Provider, MD  warfarin (COUMADIN) 5 MG tablet TAKE 1 TABLET BY MOUTH EVERY DAY OR AS DIRECTED BY ANTICOAGULATION CLINIC 05/21/13   Chipper Herb, MD     ROS: As above in the HPI. All other systems are stable or negative.  OBJECTIVE: APPEARANCE:  Patient in no acute distress.The patient appeared well nourished and normally developed. Acyanotic. Waist: VITAL SIGNS:BP 179/67  Pulse 80  Temp(Src) 99.4 F (37.4 C) (Oral)  Ht 5' 9.5" (1.765 m)  Wt 227 lb 12.8 oz (103.329 kg)  BMI 33.17 kg/m2 Recheck BP 155/88 patient not yet taken his medications.  Obese WM  SKIN: warm and  Dry without overt rashes, tattoos and scars  HEAD and Neck: without JVD, Head and scalp: normal Eyes:No scleral icterus. Fundi normal, eye movements normal. Ears: Auricle normal, canal normal, Tympanic membranes normal, insufflation normal. Nose: normal Throat: normal Neck & thyroid: normal  CHEST & LUNGS: Chest wall: normal Lungs: Clear  CVS: Reveals the PMI to be normally located. Regular rhythm, First and Second Heart sounds are normal,  absence of murmurs, rubs or gallops. Peripheral vasculature: Radial pulses: normal Dorsal pedis pulses: normal Posterior  pulses: normal  ABDOMEN:  Appearance: Obese Benign, no organomegaly, no masses, no Abdominal Aortic enlargement. No Guarding , no rebound. No Bruits. Bowel sounds: normal  RECTAL: heme negative black stools. No rectal masses. GU: N/A  EXTREMETIES: nonedematous.  MUSCULOSKELETAL:  Spine: normal Joints: intact  NEUROLOGIC: oriented to time,place and person; nonfocal.  ASSESSMENT: Weakness generalized - Plan: POCT CBC, CMP14+EGFR, TSH, POCT Influenza A/B, POCT urinalysis dipstick, POCT UA - Microscopic Only  Other and unspecified hyperlipidemia  A-fib  ANEMIA, IRON DEFICIENCY  Anticoagulant long-term use  Angiodysplasia of stomach  Asthma with COPD  CHF, chronic  GERD  Hepatomegaly  HYPERTENSION - Plan: CMP14+EGFR  Obesity  Chills - Plan: POCT CBC, CMP14+EGFR, POCT Influenza A/B, POCT urinalysis dipstick, POCT UA - Microscopic Only  Chest congestion - Plan: DG Chest 2 View, amoxicillin (AMOXIL) 875 MG tablet   PLAN: Orders Placed This Encounter  Procedures  . DG Chest 2 View    Standing Status: Future     Number of Occurrences: 1     Standing Expiration Date: 09/15/2014    Order Specific Question:  Reason for Exam (SYMPTOM  OR DIAGNOSIS REQUIRED)    Answer:  chest congestion    Order Specific Question:  Preferred imaging location?    Answer:  Internal  . CMP14+EGFR  . TSH  . POCT CBC  . POCT Influenza A/B  . POCT urinalysis dipstick  . POCT UA - Microscopic Only    [facility] reading (PRIMARY) by  Dr. Jacelyn Grip: no acute findings, chronic  Changes  Results for orders placed in visit on 07/16/13  POCT CBC      Result Value Range   WBC 11.1 (*) 4.6 - 10.2 K/uL   Lymph,  poc 0.9  0.6 - 3.4   POC LYMPH PERCENT 7.8 (*) 10 - 50 %L   MID (cbc)    0 - 0.9   POC MID %    0 - 12 %M   POC Granulocyte 9.7 (*) 2 - 6.9   Granulocyte percent 87.1 (*) 37 - 80 %G   RBC 4.5 (*) 4.69 - 6.13 M/uL   Hemoglobin 14.8  14.1 - 18.1 g/dL   HCT, POC 43.8  43.5 - 53.7 %   MCV  96.5  80 - 97 fL   MCH, POC 32.6 (*) 27 - 31.2 pg   MCHC 33.8  31.8 - 35.4 g/dL   RDW, POC 12.4     Platelet Count, POC 280.0  142 - 424 K/uL   MPV 8.0  0 - 99.8 fL  POCT INFLUENZA A/B      Result Value Range   Influenza A, POC Negative     Influenza B, POC Negative     Meds ordered this encounter  Medications  . DISCONTD: diltiazem (CARDIZEM CD) 300 MG 24 hr capsule    Sig:   . DISCONTD: isosorbide mononitrate (IMDUR) 120 MG 24 hr tablet    Sig:   . amoxicillin (AMOXIL) 875 MG tablet    Sig: Take 1 tablet (875 mg total) by mouth 2 (two) times daily.    Dispense:  20 tablet    Refill:  0   If acutely worse RTC stat for  Re-evaluation.                            Return if symptoms worsen or fail to improve.   Taygan Connell P. Jacelyn Grip, M.D.

## 2013-07-17 LAB — TSH: TSH: 1.54 u[IU]/mL (ref 0.450–4.500)

## 2013-07-17 LAB — CMP14+EGFR
ALT: 40 IU/L (ref 0–44)
AST: 52 IU/L — ABNORMAL HIGH (ref 0–40)
Albumin/Globulin Ratio: 1.8 (ref 1.1–2.5)
Albumin: 4.8 g/dL (ref 3.5–4.8)
Alkaline Phosphatase: 78 IU/L (ref 39–117)
BUN/Creatinine Ratio: 17 (ref 10–22)
BUN: 20 mg/dL (ref 8–27)
CO2: 25 mmol/L (ref 18–29)
Calcium: 9.8 mg/dL (ref 8.6–10.2)
Chloride: 96 mmol/L — ABNORMAL LOW (ref 97–108)
Creatinine, Ser: 1.18 mg/dL (ref 0.76–1.27)
GFR calc Af Amer: 70 mL/min/{1.73_m2} (ref >59)
GFR calc non Af Amer: 60 mL/min/{1.73_m2} (ref >59)
Globulin, Total: 2.7 g/dL (ref 1.5–4.5)
Glucose: 97 mg/dL (ref 65–99)
Potassium: 5 mmol/L (ref 3.5–5.2)
Sodium: 140 mmol/L (ref 134–144)
Total Bilirubin: 0.5 mg/dL (ref 0.0–1.2)
Total Protein: 7.5 g/dL (ref 6.0–8.5)

## 2013-07-18 ENCOUNTER — Encounter: Payer: Self-pay | Admitting: Interventional Cardiology

## 2013-07-18 ENCOUNTER — Ambulatory Visit (INDEPENDENT_AMBULATORY_CARE_PROVIDER_SITE_OTHER): Payer: Medicare Other | Admitting: Interventional Cardiology

## 2013-07-18 VITALS — BP 132/64 | HR 78 | Ht 69.5 in | Wt 225.0 lb

## 2013-07-18 DIAGNOSIS — I10 Essential (primary) hypertension: Secondary | ICD-10-CM

## 2013-07-18 DIAGNOSIS — Z7901 Long term (current) use of anticoagulants: Secondary | ICD-10-CM

## 2013-07-18 DIAGNOSIS — J449 Chronic obstructive pulmonary disease, unspecified: Secondary | ICD-10-CM

## 2013-07-18 DIAGNOSIS — I4891 Unspecified atrial fibrillation: Secondary | ICD-10-CM

## 2013-07-18 DIAGNOSIS — R0609 Other forms of dyspnea: Secondary | ICD-10-CM

## 2013-07-18 DIAGNOSIS — R0600 Dyspnea, unspecified: Secondary | ICD-10-CM | POA: Insufficient documentation

## 2013-07-18 NOTE — Progress Notes (Signed)
Patient ID: [name] [name], male   DOB: 02/04/1941, 72 y.o.   MRN: [id number]    HPI The patient is improved since I last saw him in September. We've made blood pressure medication adjustments. We resumed Diovan 160 mg daily. I decreased the spiral lactone initially to 25 mg per day. Recent potassium by Dr. Jacelyn Grip was 5.0. This likely require some additional adjustment lower part. His breathing has improved. Dr. Jacelyn Grip, treated him with an antibiotic, which is made a significant improvement in condition. He denies orthopnea. No PND. No ankle edema. No medication side effects. He did not bring his medications with him. It concerns me that we may not be talking "apples to apples". He also states that if his blood pressure is normal. When he awakens in the morning. He won't take his blood pressure medications.  No Known Allergies  Current Outpatient Prescriptions  Medication Sig Dispense Refill  . acetaminophen (TYLENOL) 500 MG tablet Take 1,000 mg by mouth every 6 (six) hours as needed. For pain.       Marland Kitchen albuterol (PROVENTIL HFA;VENTOLIN HFA) 108 (90 BASE) MCG/ACT inhaler Inhale 2 puffs into the lungs every 6 (six) hours as needed. For shortness of breath.       Marland Kitchen amoxicillin (AMOXIL) 875 MG tablet Take 1 tablet (875 mg total) by mouth 2 (two) times daily.  20 tablet  0  . budesonide (PULMICORT) 0.25 MG/2ML nebulizer solution Take 0.25 mg by nebulization 2 (two) times daily.       . cetirizine (ZYRTEC) 10 MG tablet Take 1 tablet (10 mg total) by mouth daily.  30 tablet  11  . Cholecalciferol (VITAMIN D3) 5000 UNITS CAPS Take 1 capsule by mouth daily.        Marland Kitchen diltiazem (TIAZAC) 300 MG 24 hr capsule Take 300 mg by mouth daily.        . Fe Fum-FePoly-FA-Vit C-Vit B3 (INTEGRA F) 125-1 MG CAPS TAKE ONE TABLET BY MOUTH TWICE A DAY  60 capsule  4  . ferrous sulfate 325 (65 FE) MG tablet Take 325 mg by mouth daily with breakfast.      . fluticasone (FLONASE) 50 MCG/ACT nasal spray Place 2 sprays into the nose  daily.  16 g  6  . furosemide (LASIX) 40 MG tablet Take 1 tablet (40 mg total) by mouth daily.  30 tablet    . ipratropium-albuterol (DUONEB) 0.5-2.5 (3) MG/3ML SOLN Take 3 mLs by nebulization every 6 (six) hours as needed. For shortness of breath.       . isosorbide-hydrALAZINE (BIDIL) 20-37.5 MG per tablet Take 1 tablet by mouth 2 (two) times daily.      Marland Kitchen levothyroxine (SYNTHROID, LEVOTHROID) 100 MCG tablet Take 1 tablet (100 mcg total) by mouth daily.  30 tablet  10  . lovastatin (MEVACOR) 40 MG tablet Take 40 mg by mouth at bedtime.        . Multiple Vitamins-Minerals (MULTIVITAMINS THER. W/MINERALS) TABS Take 1 tablet by mouth daily.        . nitroGLYCERIN (NITROSTAT) 0.4 MG SL tablet Place 0.4 mg under the tongue every 5 (five) minutes as needed for chest pain.      Marland Kitchen omeprazole (PRILOSEC) 40 MG capsule Take 1 capsule (40 mg total) by mouth daily.  90 capsule  1  . spironolactone (ALDACTONE) 25 MG tablet Take 1 tablet (25 mg total) by mouth 2 (two) times daily.      . valsartan (DIOVAN) 160 MG tablet Take 1 tablet (  160 mg total) by mouth daily.  30 tablet  5  . warfarin (COUMADIN) 5 MG tablet TAKE 1 TABLET BY MOUTH EVERY DAY OR AS DIRECTED BY ANTICOAGULATION CLINIC  30 tablet  2   No current facility-administered medications for this visit.    Past Medical History  Diagnosis Date  . Unspecified essential hypertension   . Anemia   . Asthma   . Hepatomegaly   . Esophageal reflux   . Other and unspecified coagulation defects   . Hiatal hernia   . Personal history of colonic polyps 05/29/2010    TUBULAR ADENOMA  . CAD (coronary artery disease)   . Hypothyroidism   . Gout   . Hyperlipidemia   . Gastric antral vascular ectasia 2013    Past Surgical History  Procedure Laterality Date  . Coronary artery bypass graft    . Coronary angioplasty with stent placement    . Umbilical hernia repair      ROS: No side effects. Takes his blood pressure medicine only if it is elevated when  he finishes it in the mornings. No neurological complaints. Denies syncope. No blood in stool, or urine. Recent hemoglobin 14.8.  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 Dyspnea, improved with antibiotic therapy by Dr. Jacelyn Grip.  2. CAD with no angina.  3. Atrial fibrillation, with good. Rate control.  4. Hypertension, significant improvement with recent medication adjustments.  Overall, [name] [name] is doing better. His blood pressure is better controlled. The elevated potassium causes me to discontinue the spironolactone. Creatinine is 1.20 recent blood work performed by Dr. Jacelyn Grip. Hemoglobin has risen to 14.8. Plan is to see him back in 6 months.

## 2013-07-18 NOTE — Patient Instructions (Addendum)
STOP SPIRONOLACTONE  MAKE SURE YOU TAKE YOUR BLOOD PRESSURE MEDICATIONS DAILY  FOLLOW UP WITH DR.SMITH IN 6 MONTHS. WE WILL SEND YOU A REMINDER LETTER TO CALL us AND SCHEDULE THAT APPOINTMENT  PLEASE CALL us IF YOU HAVE INCREASED SHORTNESS OF BREATH. [phone]

## 2013-08-06 ENCOUNTER — Ambulatory Visit (INDEPENDENT_AMBULATORY_CARE_PROVIDER_SITE_OTHER): Payer: Medicare Other | Admitting: Pharmacist

## 2013-08-06 DIAGNOSIS — I4891 Unspecified atrial fibrillation: Secondary | ICD-10-CM

## 2013-08-06 LAB — POCT INR: INR: 1.6

## 2013-08-06 NOTE — Patient Instructions (Signed)
Anticoagulation Dose Instructions as of 08/06/2013     [name] [name] Tue Wed Thu Fri Sat   New Dose 2.5 mg 5 mg 2.5 mg 5 mg 2.5 mg 5 mg 5 mg    Description       Goal INR 1.8-2.0 Take extra 1/2 tablet for 1 day then start 1 tablet Mondays Wednesdays, Fridays and Saturdays and 1/2 tablet on Sundays, Tuesdays and Thursdays.       INR was 1.6 today (goal is 1.8 to 2.0)

## 2013-08-16 ENCOUNTER — Other Ambulatory Visit: Payer: Self-pay

## 2013-08-16 MED ORDER — DILTIAZEM HCL ER BEADS 300 MG PO CP24: 300.0000 mg | ORAL_CAPSULE | Freq: Every day | ORAL | Status: DC

## 2013-08-22 ENCOUNTER — Other Ambulatory Visit: Payer: Self-pay | Admitting: Family Medicine

## 2013-09-05 ENCOUNTER — Encounter (INDEPENDENT_AMBULATORY_CARE_PROVIDER_SITE_OTHER): Payer: Self-pay

## 2013-09-05 ENCOUNTER — Telehealth: Payer: Self-pay | Admitting: Pharmacist

## 2013-09-05 ENCOUNTER — Ambulatory Visit (INDEPENDENT_AMBULATORY_CARE_PROVIDER_SITE_OTHER): Payer: Medicare Other | Admitting: Pharmacist

## 2013-09-05 DIAGNOSIS — I4891 Unspecified atrial fibrillation: Secondary | ICD-10-CM

## 2013-09-05 LAB — POCT CBC
Lymph, poc: 1.4 (ref 0.6–3.4)
MCH, POC: 32.3 pg — AB (ref 27–31.2)
MCHC: 32.9 g/dL (ref 31.8–35.4)
MCV: 98 fL — AB (ref 80–97)
MPV: 8.5 fL (ref 0–99.8)
POC Granulocyte: 8.1 — AB (ref 2–6.9)
POC LYMPH PERCENT: 13.2 %L (ref 10–50)
Platelet Count, POC: 272 10*3/uL (ref 142–424)
RBC: 3.9 M/uL — AB (ref 4.69–6.13)
RDW, POC: 12.9 %
WBC: 10.4 10*3/uL — AB (ref 4.6–10.2)

## 2013-09-05 LAB — POCT INR: INR: 1.6

## 2013-09-05 NOTE — Telephone Encounter (Signed)
Patent called with results

## 2013-09-05 NOTE — Patient Instructions (Signed)
Anticoagulation Dose Instructions as of 09/05/2013     [name] [name] Tue Wed Thu Fri Sat   New Dose 5 mg 2.5 mg 5 mg 5 mg 5 mg 2.5 mg 5 mg    Description       Goal INR 1.8-2.0 Increase to 1/2 tablet Mondays and Fridays and 1 tablet all other days.       INR was 1.6 today (your goal is 1.8 to 2.0)

## 2013-09-11 ENCOUNTER — Other Ambulatory Visit: Payer: Self-pay | Admitting: Family Medicine

## 2013-09-24 ENCOUNTER — Telehealth: Payer: Self-pay | Admitting: Interventional Cardiology

## 2013-09-24 DIAGNOSIS — I10 Essential (primary) hypertension: Secondary | ICD-10-CM

## 2013-09-24 NOTE — Telephone Encounter (Signed)
New problem    Pt's med Alazine 20-37. Per pt is not working on the top number  It has been as high as 170 to 191.    Pt needs a call back about his BP med. And maybe changing it.   Please give pt a call back please.

## 2013-09-25 ENCOUNTER — Other Ambulatory Visit: Payer: Self-pay

## 2013-09-25 MED ORDER — POTASSIUM CHLORIDE ER 10 MEQ PO TBCR: 10.0000 meq | EXTENDED_RELEASE_TABLET | Freq: Every day | ORAL | Status: DC

## 2013-09-25 MED ORDER — ISOSORB DINITRATE-HYDRALAZINE 20-37.5 MG PO TABS: 1.0000 | ORAL_TABLET | Freq: Two times a day (BID) | ORAL | Status: DC

## 2013-09-25 MED ORDER — FUROSEMIDE 40 MG PO TABS: 40.0000 mg | ORAL_TABLET | Freq: Every day | ORAL | Status: DC

## 2013-09-25 MED ORDER — FUROSEMIDE 40 MG PO TABS
40.0000 mg | ORAL_TABLET | Freq: Two times a day (BID) | ORAL | Status: DC
Start: 2013-09-25 — End: 2014-06-04

## 2013-09-25 NOTE — Telephone Encounter (Signed)
Follow Up:  Pt is calling in and states he is still waiting on an answer... Pt is requesting a call back

## 2013-09-25 NOTE — Telephone Encounter (Signed)
returned pt call pt sts that his bp has been elevated the last 2 days yesterday 170/60, today 190/60.pt sts that he is a little dizzy but has no other symptoms.pt given Dr.Smith instructions increase lasix to 40mg  bid.start potassium 36meq daily.continue all other medications as prescribed.pt lab appt made for 2 wks Jan 04/2014 for a bmet.pt verbalized understanding.

## 2013-09-25 NOTE — Telephone Encounter (Signed)
Follow up    Pt is waiting for a return call from the nurse regarding his blood pressure.  He said the top number is running high.  Will he need to increase his bp medication?

## 2013-10-04 DIAGNOSIS — N189 Chronic kidney disease, unspecified: Secondary | ICD-10-CM

## 2013-10-04 HISTORY — DX: Chronic kidney disease, unspecified: N18.9

## 2013-10-10 ENCOUNTER — Other Ambulatory Visit: Payer: Medicare Other

## 2013-10-11 ENCOUNTER — Ambulatory Visit (INDEPENDENT_AMBULATORY_CARE_PROVIDER_SITE_OTHER): Payer: Medicare Other | Admitting: Pharmacist

## 2013-10-11 ENCOUNTER — Other Ambulatory Visit (INDEPENDENT_AMBULATORY_CARE_PROVIDER_SITE_OTHER): Payer: Medicare Other

## 2013-10-11 ENCOUNTER — Telehealth: Payer: Self-pay | Admitting: Pharmacist

## 2013-10-11 DIAGNOSIS — I4891 Unspecified atrial fibrillation: Secondary | ICD-10-CM

## 2013-10-11 DIAGNOSIS — D649 Anemia, unspecified: Secondary | ICD-10-CM

## 2013-10-11 DIAGNOSIS — I10 Essential (primary) hypertension: Secondary | ICD-10-CM

## 2013-10-11 LAB — POCT CBC
GRANULOCYTE PERCENT: 79 % (ref 37–80)
HCT, POC: 41.5 % — AB (ref 43.5–53.7)
Hemoglobin: 13.2 g/dL — AB (ref 14.1–18.1)
Lymph, poc: 1.8 (ref 0.6–3.4)
MCH, POC: 30.6 pg (ref 27–31.2)
MCHC: 31.8 g/dL (ref 31.8–35.4)
MCV: 96.3 fL (ref 80–97)
MPV: 8.2 fL (ref 0–99.8)
POC GRANULOCYTE: 9.8 — AB (ref 2–6.9)
POC LYMPH PERCENT: 14.4 %L (ref 10–50)
Platelet Count, POC: 312 10*3/uL (ref 142–424)
RBC: 4.3 M/uL — AB (ref 4.69–6.13)
RDW, POC: 13.2 %
WBC: 12.4 10*3/uL — AB (ref 4.6–10.2)

## 2013-10-11 LAB — BASIC METABOLIC PANEL
BUN: 23 mg/dL (ref 6–23)
CO2: 28 mEq/L (ref 19–32)
CREATININE: 1.3 mg/dL (ref 0.4–1.5)
Calcium: 9.4 mg/dL (ref 8.4–10.5)
Chloride: 104 mEq/L (ref 96–112)
GFR: 58.35 mL/min — AB (ref >60.00)
GLUCOSE: 68 mg/dL — AB (ref 70–99)
Potassium: 4 mEq/L (ref 3.5–5.1)
Sodium: 141 mEq/L (ref 135–145)

## 2013-10-11 LAB — POCT INR: INR: 2

## 2013-10-11 NOTE — Telephone Encounter (Signed)
appt rescheduled for today at 3pm

## 2013-10-11 NOTE — Patient Instructions (Signed)
Anticoagulation Dose Instructions as of 10/11/2013     Dorene Grebe Tue Wed Thu Fri Sat   New Dose 5 mg 2.5 mg 5 mg 5 mg 5 mg 2.5 mg 5 mg    Description       Goal INR 1.8-2.0 Continue 1/2 tablet Mondays and Fridays and 1 tablet all other days.       INR was 2.0 today

## 2013-10-15 ENCOUNTER — Telehealth: Payer: Self-pay | Admitting: Interventional Cardiology

## 2013-10-15 NOTE — Telephone Encounter (Signed)
New message ° ° ° ° ° °Returning a nurses call to get test results °

## 2013-10-15 NOTE — Telephone Encounter (Signed)
I spoke with the pt and made him aware of BMP results.   Notes Recorded by Sinclair Grooms, MD on 10/14/2013 at 3:18 PM Labs are okay on Lasix adjustment. Is BP better. If <140/90 mmHg, no new changes needed.  The pt has been checking his BP but has not been writing this information down.  The pt said his SBP on average is running around 160 and does jump to 180 at times.  The DBP is in the 70s. I will forward this information to Dr Tamala Julian to make further medication recommendations.

## 2013-11-04 DIAGNOSIS — K819 Cholecystitis, unspecified: Secondary | ICD-10-CM

## 2013-11-04 HISTORY — DX: Cholecystitis, unspecified: K81.9

## 2013-12-03 ENCOUNTER — Other Ambulatory Visit (HOSPITAL_COMMUNITY): Payer: Self-pay | Admitting: Cardiology

## 2013-12-03 DIAGNOSIS — I6529 Occlusion and stenosis of unspecified carotid artery: Secondary | ICD-10-CM

## 2013-12-04 ENCOUNTER — Ambulatory Visit (HOSPITAL_COMMUNITY): Payer: Medicare Other | Attending: Internal Medicine

## 2013-12-04 DIAGNOSIS — I6529 Occlusion and stenosis of unspecified carotid artery: Secondary | ICD-10-CM | POA: Insufficient documentation

## 2013-12-05 ENCOUNTER — Ambulatory Visit (INDEPENDENT_AMBULATORY_CARE_PROVIDER_SITE_OTHER): Payer: Medicare Other | Admitting: Pharmacist

## 2013-12-05 DIAGNOSIS — I4891 Unspecified atrial fibrillation: Secondary | ICD-10-CM

## 2013-12-05 LAB — POCT INR: INR: 3.1

## 2013-12-05 NOTE — Patient Instructions (Signed)
Anticoagulation Dose Instructions as of 12/05/2013     Dorene Grebe Tue Wed Thu Fri Sat   New Dose 5 mg 2.5 mg 5 mg 1.25 mg 5 mg 2.5 mg 5 mg    Description       Goal INR 1.8-2.0 No warfarin tomorrow.  Then decrease to 1/2 tablet Mondays, Wednesdays and Fridays and 1 tablet all other days.       INR was 3.1 today (a little too thin)

## 2013-12-07 ENCOUNTER — Encounter (HOSPITAL_COMMUNITY): Payer: Self-pay | Admitting: Interventional Cardiology

## 2013-12-07 ENCOUNTER — Other Ambulatory Visit: Payer: Self-pay

## 2013-12-07 MED ORDER — LOVASTATIN 40 MG PO TABS: 40.0000 mg | ORAL_TABLET | Freq: Every day | ORAL | Status: DC

## 2013-12-10 ENCOUNTER — Ambulatory Visit: Payer: Medicare Other | Admitting: Interventional Cardiology

## 2013-12-26 ENCOUNTER — Ambulatory Visit (INDEPENDENT_AMBULATORY_CARE_PROVIDER_SITE_OTHER): Payer: Medicare Other | Admitting: Pharmacist

## 2013-12-26 DIAGNOSIS — I4891 Unspecified atrial fibrillation: Secondary | ICD-10-CM

## 2013-12-26 LAB — POCT INR: INR: 1.9

## 2013-12-26 NOTE — Patient Instructions (Signed)
Anticoagulation Dose Instructions as of 12/26/2013     [name] [name] Tue Wed Thu Fri Sat   New Dose 5 mg 2.5 mg 5 mg 2.5 mg 5 mg 2.5 mg 5 mg    Description       Goal INR 1.8-2.0 Continue 1/2 tablet Mondays, Wednesdays and Fridays and 1 tablet all other days.       INR was 1.9 today

## 2013-12-31 ENCOUNTER — Other Ambulatory Visit: Payer: Self-pay | Admitting: Family Medicine

## 2014-01-03 ENCOUNTER — Telehealth: Payer: Self-pay | Admitting: Family Medicine

## 2014-01-23 ENCOUNTER — Telehealth: Payer: Self-pay | Admitting: Interventional Cardiology

## 2014-01-23 ENCOUNTER — Encounter: Payer: Self-pay | Admitting: Interventional Cardiology

## 2014-01-23 ENCOUNTER — Ambulatory Visit (INDEPENDENT_AMBULATORY_CARE_PROVIDER_SITE_OTHER): Payer: Medicare Other | Admitting: Interventional Cardiology

## 2014-01-23 ENCOUNTER — Telehealth: Payer: Self-pay | Admitting: Family Medicine

## 2014-01-23 VITALS — BP 204/82 | HR 64 | Ht 69.5 in | Wt 221.0 lb

## 2014-01-23 DIAGNOSIS — I5022 Chronic systolic (congestive) heart failure: Secondary | ICD-10-CM

## 2014-01-23 DIAGNOSIS — Z7901 Long term (current) use of anticoagulants: Secondary | ICD-10-CM

## 2014-01-23 DIAGNOSIS — J449 Chronic obstructive pulmonary disease, unspecified: Secondary | ICD-10-CM

## 2014-01-23 DIAGNOSIS — R0600 Dyspnea, unspecified: Secondary | ICD-10-CM

## 2014-01-23 DIAGNOSIS — I10 Essential (primary) hypertension: Secondary | ICD-10-CM

## 2014-01-23 DIAGNOSIS — I4891 Unspecified atrial fibrillation: Secondary | ICD-10-CM

## 2014-01-23 DIAGNOSIS — I6529 Occlusion and stenosis of unspecified carotid artery: Secondary | ICD-10-CM

## 2014-01-23 DIAGNOSIS — R0609 Other forms of dyspnea: Secondary | ICD-10-CM

## 2014-01-23 DIAGNOSIS — R0989 Other specified symptoms and signs involving the circulatory and respiratory systems: Secondary | ICD-10-CM

## 2014-01-23 LAB — BASIC METABOLIC PANEL
BUN: 29 mg/dL — ABNORMAL HIGH (ref 6–23)
CALCIUM: 9.5 mg/dL (ref 8.4–10.5)
CO2: 31 mEq/L (ref 19–32)
Chloride: 102 mEq/L (ref 96–112)
Creatinine, Ser: 1.2 mg/dL (ref 0.4–1.5)
GFR: 63.42 mL/min (ref >60.00)
Glucose, Bld: 65 mg/dL — ABNORMAL LOW (ref 70–99)
POTASSIUM: 4.2 meq/L (ref 3.5–5.1)
SODIUM: 141 meq/L (ref 135–145)

## 2014-01-23 LAB — CBC WITH DIFFERENTIAL/PLATELET
BASOS ABS: 0.1 10*3/uL (ref 0.0–0.1)
Basophils Relative: 0.5 % (ref 0.0–3.0)
Eosinophils Absolute: 0.2 10*3/uL (ref 0.0–0.7)
Eosinophils Relative: 2 % (ref 0.0–5.0)
HCT: 38.8 % — ABNORMAL LOW (ref 39.0–52.0)
Hemoglobin: 12.8 g/dL — ABNORMAL LOW (ref 13.0–17.0)
LYMPHS ABS: 1.1 10*3/uL (ref 0.7–4.0)
Lymphocytes Relative: 10.9 % — ABNORMAL LOW (ref 12.0–46.0)
MCHC: 32.9 g/dL (ref 30.0–36.0)
MCV: 97.8 fl (ref 78.0–100.0)
MONO ABS: 1.2 10*3/uL — AB (ref 0.1–1.0)
Monocytes Relative: 11.6 % (ref 3.0–12.0)
Neutro Abs: 7.9 10*3/uL — ABNORMAL HIGH (ref 1.4–7.7)
Neutrophils Relative %: 75 % (ref 43.0–77.0)
PLATELETS: 264 10*3/uL (ref 150.0–400.0)
RBC: 3.97 Mil/uL — ABNORMAL LOW (ref 4.22–5.81)
RDW: 14.6 % (ref 11.5–14.6)
WBC: 10.5 10*3/uL (ref 4.5–10.5)

## 2014-01-23 LAB — BRAIN NATRIURETIC PEPTIDE: Pro B Natriuretic peptide (BNP): 111 pg/mL — ABNORMAL HIGH (ref 0.0–100.0)

## 2014-01-23 MED ORDER — ISOSORB DINITRATE-HYDRALAZINE 20-37.5 MG PO TABS: 1.0000 | ORAL_TABLET | Freq: Two times a day (BID) | ORAL | Status: DC

## 2014-01-23 NOTE — Progress Notes (Signed)
Patient ID: [name] [name], male   DOB: 1941/06/09, 73 y.o.   MRN: [id number]    [address], [address], [place]  [address] Phone: [phone] Fax:  [phone]  Date:  01/23/2014   ID:  [place], DOB 1941-01-07, MRN [id number]  PCP:  [name], MD   ASSESSMENT:  1. Dyspnea 2. Coronary artery disease without angina 3. Systolic heart failure, chronic 4. Chronic atrial fibrillation 5. Hypertension, poorly controlled 6. Questionable medication compliance 7. Chronic anticoagulation therapy 8. Carotid artery disease 9. COPD  PLAN:  1. Dyspnea will be evaluated with a BNP and CBC. The CBC because the patient has a history of GI bleeding on anticoagulation therapy. 2. We need to reconcile the patient's current medication regimen. His blood pressure is elevated today but he also tells me that some of the medications on our list, such as Bidil and spironolactone, he has stopped taking 3. Clinical followup and 6 months 4. Carotid Doppler 6 months from the study done in March to follow significant obstructive disease   SUBJECTIVE: [name] [name] is a 73 y.o. male whose major complaint is dyspnea. He denies orthopnea or lower extremity swelling. He has not taken his medications according to her record. He has discontinued to Bidil and spiral lactone. He denies edema. He has not had angina. There been no transient neurological symptoms. He does have some wheezing. He is not smoking. He denies cough and phlegm production.   Wt Readings from Last 3 Encounters:  01/23/14 221 lb (100.245 kg)  07/18/13 225 lb (102.059 kg)  07/16/13 227 lb 12.8 oz (103.329 kg)     Past Medical History  Diagnosis Date  . Unspecified essential hypertension   . Anemia   . Asthma   . Hepatomegaly   . Esophageal reflux   . Other and unspecified coagulation defects   . Hiatal hernia   . Personal history of colonic polyps 05/29/2010    TUBULAR ADENOMA  . CAD (coronary artery disease)    . Hypothyroidism   . Gout   . Hyperlipidemia   . Gastric antral vascular ectasia 2013    Current Outpatient Prescriptions  Medication Sig Dispense Refill  . acetaminophen (TYLENOL) 500 MG tablet Take 1,000 mg by mouth every 6 (six) hours as needed. For pain.       Marland Kitchen albuterol (PROVENTIL HFA;VENTOLIN HFA) 108 (90 BASE) MCG/ACT inhaler Inhale 2 puffs into the lungs every 6 (six) hours as needed. For shortness of breath.       . budesonide (PULMICORT) 0.25 MG/2ML nebulizer solution Take 0.25 mg by nebulization 2 (two) times daily.       . cetirizine (ZYRTEC) 10 MG tablet Take 1 tablet (10 mg total) by mouth daily.  30 tablet  11  . Cholecalciferol (VITAMIN D3) 5000 UNITS CAPS Take 1 capsule by mouth daily.        Marland Kitchen diltiazem (TIAZAC) 300 MG 24 hr capsule Take 1 capsule (300 mg total) by mouth daily.  30 capsule  6  . Fe Fum-FePoly-FA-Vit C-Vit B3 (INTEGRA F) 125-1 MG CAPS TAKE ONE TABLET BY MOUTH TWICE A DAY  60 capsule  4  . fluticasone (FLONASE) 50 MCG/ACT nasal spray Place 2 sprays into the nose daily.  16 g  6  . furosemide (LASIX) 40 MG tablet Take 1 tablet (40 mg total) by mouth 2 (two) times daily.  60 tablet  10  . ipratropium-albuterol (DUONEB) 0.5-2.5 (3) MG/3ML SOLN Take 3 mLs  by nebulization every 6 (six) hours as needed. For shortness of breath.       . isosorbide-hydrALAZINE (BIDIL) 20-37.5 MG per tablet Take 1 tablet by mouth 2 (two) times daily.  60 tablet  10  . levothyroxine (SYNTHROID, LEVOTHROID) 100 MCG tablet Take 1 tablet (100 mcg total) by mouth daily.  30 tablet  10  . lovastatin (MEVACOR) 40 MG tablet Take 1 tablet (40 mg total) by mouth at bedtime.  90 tablet  2  . Multiple Vitamins-Minerals (MULTIVITAMINS THER. W/MINERALS) TABS Take 1 tablet by mouth daily.        . nitroGLYCERIN (NITROSTAT) 0.4 MG SL tablet Place 0.4 mg under the tongue every 5 (five) minutes as needed for chest pain.      Marland Kitchen omeprazole (PRILOSEC) 40 MG capsule TAKE ONE CAPSULE BY MOUTH EVERY MORNING   90 capsule  1  . potassium chloride (K-DUR) 10 MEQ tablet Take 1 tablet (10 mEq total) by mouth daily.  30 tablet  10  . valsartan (DIOVAN) 160 MG tablet Take 1 tablet (160 mg total) by mouth daily.  30 tablet  5  . warfarin (COUMADIN) 5 MG tablet TAKE 1 TABLET BY MOUTH EVERY DAY OR AS DIRECTED BY ANTICOAGULATION CLINIC  30 tablet  0   No current facility-administered medications for this visit.    Allergies:   No Known Allergies  Social History:  The patient  reports that he quit smoking about 17 years ago. His smoking use included Cigarettes. He smoked 0.00 packs per day. He has never used smokeless tobacco. He reports that he drinks alcohol. He reports that he does not use illicit drugs.   ROS:  Please see the history of present illness.   Weight loss. Appetite is stable. No transient neurological symptoms.   All other systems reviewed and negative.   OBJECTIVE: VS:  BP 204/82  Pulse 64  Ht 5' 9.5" (1.765 m)  Wt 221 lb (100.245 kg)  BMI 32.18 kg/m2 Well nourished, well developed, in no acute distress, elderly HEENT: normal Neck: JVD flat with the patient lying at 45. Carotid bruit absent  Cardiac:  normal S1, S2; IIRR; no murmur Lungs:  clear to auscultation bilaterally, no wheezing, rhonchi or rales Abd: soft, nontender, no hepatomegaly Ext: Edema absent. Pulses 2+ bilateral Skin: warm and dry Neuro:  CNs 2-12 intact, no focal abnormalities noted  EKG:  Not repeated       Signed, Illene Labrador III, MD 01/23/2014 8:12 AM

## 2014-01-23 NOTE — Addendum Note (Signed)
Addended by: Lamar Laundry on: 01/23/2014 01:18 PM   Modules accepted: Orders

## 2014-01-23 NOTE — Addendum Note (Signed)
Addended by: Stephannie Peters on: 01/23/2014 08:42 AM   Modules accepted: Orders

## 2014-01-23 NOTE — Telephone Encounter (Signed)
New Message:  Pt's wife states she was told to call [name] back

## 2014-01-23 NOTE — Telephone Encounter (Signed)
returend pt wife call.pt sts wife sts that pt is currently taking everything on med list except bidil 20-37.5mg  bid.pt wife sts that it was never picked up from pharmacy.pt wife given Dr.Smith instructions pick up and start Bidil as prescribed.pt wife verbalized understanding.

## 2014-01-23 NOTE — Patient Instructions (Signed)
NO CHANGES WERE MADE TODAY  LAB TODAY: BMET, BNP, CBC  YOU WERE GIVEN A COPY OF YOUR MEDICATION LIST TODAY.PLEASE COMPARE IT TO YOUR MEDICATIONS AT HOME AND CALL THE OFFICE WITH THE MEDICATIONS YOU ARE TAKING  Your physician has requested that you have a carotid duplex. This test is an ultrasound of the carotid arteries in your neck. It looks at blood flow through these arteries that supply the brain with blood. Allow one hour for this exam. There are no restrictions or special instructions.(TO BE DONE IN September 2015)  Your physician wants you to follow-up in: [place] will receive a reminder letter in the mail two months in advance. If you don't receive a letter, please call our office to schedule the follow-up appointment.

## 2014-02-06 ENCOUNTER — Ambulatory Visit (INDEPENDENT_AMBULATORY_CARE_PROVIDER_SITE_OTHER): Payer: Medicare Other | Admitting: Pharmacist

## 2014-02-06 DIAGNOSIS — I4891 Unspecified atrial fibrillation: Secondary | ICD-10-CM

## 2014-02-06 LAB — POCT INR: INR: 1.9

## 2014-02-06 MED ORDER — LEVOTHYROXINE SODIUM 100 MCG PO TABS: 100.0000 ug | ORAL_TABLET | Freq: Every day | ORAL | Status: DC

## 2014-02-06 NOTE — Patient Instructions (Signed)
.   Anticoagulation Dose Instructions as of 02/06/2014     [name] [name] Tue Wed Thu Fri Sat   New Dose 5 mg 2.5 mg 5 mg 2.5 mg 5 mg 2.5 mg 5 mg    Description       Goal INR 1.8-2.0 Continue 1/2 tablet Mondays, Wednesdays and Fridays and 1 tablet all other days.      INR was 1.9 today

## 2014-02-08 ENCOUNTER — Other Ambulatory Visit: Payer: Self-pay | Admitting: Family Medicine

## 2014-02-18 ENCOUNTER — Other Ambulatory Visit: Payer: Self-pay | Admitting: *Deleted

## 2014-02-18 MED ORDER — OMEPRAZOLE 40 MG PO CPDR: DELAYED_RELEASE_CAPSULE | ORAL | Status: DC

## 2014-02-25 ENCOUNTER — Other Ambulatory Visit: Payer: Self-pay | Admitting: *Deleted

## 2014-02-25 DIAGNOSIS — E785 Hyperlipidemia, unspecified: Secondary | ICD-10-CM

## 2014-03-11 ENCOUNTER — Other Ambulatory Visit (INDEPENDENT_AMBULATORY_CARE_PROVIDER_SITE_OTHER): Payer: Medicare Other

## 2014-03-11 DIAGNOSIS — E785 Hyperlipidemia, unspecified: Secondary | ICD-10-CM

## 2014-03-11 LAB — HEPATIC FUNCTION PANEL
ALT: 21 U/L (ref 0–53)
AST: 27 U/L (ref 0–37)
Albumin: 3.4 g/dL — ABNORMAL LOW (ref 3.5–5.2)
Alkaline Phosphatase: 61 U/L (ref 39–117)
BILIRUBIN DIRECT: 0.1 mg/dL (ref 0.0–0.3)
BILIRUBIN TOTAL: 0.4 mg/dL (ref 0.2–1.2)
Total Protein: 6.4 g/dL (ref 6.0–8.3)

## 2014-03-11 LAB — LIPID PANEL
Cholesterol: 159 mg/dL (ref 0–200)
HDL: 36.8 mg/dL — ABNORMAL LOW (ref >39.00)
LDL Cholesterol: 104 mg/dL — ABNORMAL HIGH (ref 0–99)
NonHDL: 122.2
Total CHOL/HDL Ratio: 4
Triglycerides: 92 mg/dL (ref 0.0–149.0)
VLDL: 18.4 mg/dL (ref 0.0–40.0)

## 2014-03-20 ENCOUNTER — Encounter: Payer: Self-pay | Admitting: Family

## 2014-03-20 ENCOUNTER — Ambulatory Visit (INDEPENDENT_AMBULATORY_CARE_PROVIDER_SITE_OTHER): Payer: Medicare Other | Admitting: Family

## 2014-03-20 ENCOUNTER — Ambulatory Visit (INDEPENDENT_AMBULATORY_CARE_PROVIDER_SITE_OTHER): Payer: Medicare Other

## 2014-03-20 ENCOUNTER — Ambulatory Visit (INDEPENDENT_AMBULATORY_CARE_PROVIDER_SITE_OTHER): Payer: Medicare Other | Admitting: Pharmacist

## 2014-03-20 VITALS — BP 183/69 | HR 53 | Temp 97.0°F | Ht 69.5 in | Wt 221.8 lb

## 2014-03-20 DIAGNOSIS — J069 Acute upper respiratory infection, unspecified: Secondary | ICD-10-CM

## 2014-03-20 DIAGNOSIS — R05 Cough: Secondary | ICD-10-CM

## 2014-03-20 DIAGNOSIS — R059 Cough, unspecified: Secondary | ICD-10-CM

## 2014-03-20 DIAGNOSIS — R0989 Other specified symptoms and signs involving the circulatory and respiratory systems: Secondary | ICD-10-CM

## 2014-03-20 DIAGNOSIS — I4891 Unspecified atrial fibrillation: Secondary | ICD-10-CM

## 2014-03-20 LAB — POCT INR: INR: 1.8

## 2014-03-20 MED ORDER — AMOXICILLIN 875 MG PO TABS: 875.0000 mg | ORAL_TABLET | Freq: Two times a day (BID) | ORAL | Status: DC

## 2014-03-20 MED ORDER — BENZONATATE 200 MG PO CAPS: 200.0000 mg | ORAL_CAPSULE | Freq: Three times a day (TID) | ORAL | Status: DC | PRN

## 2014-03-20 NOTE — Patient Instructions (Signed)
Upper Respiratory Infection, Adult An upper respiratory infection (URI) is also sometimes known as the common cold. The upper respiratory tract includes the nose, sinuses, throat, trachea, and bronchi. Bronchi are the airways leading to the lungs. Most people improve within 1 week, but symptoms can last up to 2 weeks. A residual cough may last even longer.  CAUSES Many different viruses can infect the tissues lining the upper respiratory tract. The tissues become irritated and inflamed and often become very moist. Mucus production is also common. A cold is contagious. You can easily spread the virus to others by oral contact. This includes kissing, sharing a glass, coughing, or sneezing. Touching your mouth or nose and then touching a surface, which is then touched by another person, can also spread the virus. SYMPTOMS  Symptoms typically develop 1 to 3 days after you come in contact with a cold virus. Symptoms vary from person to person. They may include:  Runny nose.  Sneezing.  Nasal congestion.  Sinus irritation.  Sore throat.  Loss of voice (laryngitis).  Cough.  Fatigue.  Muscle aches.  Loss of appetite.  Headache.  Low-grade fever. DIAGNOSIS  You might diagnose your own cold based on familiar symptoms, since most people get a cold 2 to 3 times a year. Your caregiver can confirm this based on your exam. Most importantly, your caregiver can check that your symptoms are not due to another disease such as strep throat, sinusitis, pneumonia, asthma, or epiglottitis. Blood tests, throat tests, and X-rays are not necessary to diagnose a common cold, but they may sometimes be helpful in excluding other more serious diseases. Your caregiver will decide if any further tests are required. RISKS AND COMPLICATIONS  You may be at risk for a more severe case of the common cold if you smoke cigarettes, have chronic heart disease (such as heart failure) or lung disease (such as asthma), or if  you have a weakened immune system. The very young and very old are also at risk for more serious infections. Bacterial sinusitis, middle ear infections, and bacterial pneumonia can complicate the common cold. The common cold can worsen asthma and chronic obstructive pulmonary disease (COPD). Sometimes, these complications can require emergency medical care and may be life-threatening. PREVENTION  The best way to protect against getting a cold is to practice good hygiene. Avoid oral or hand contact with people with cold symptoms. Wash your hands often if contact occurs. There is no clear evidence that vitamin C, vitamin E, echinacea, or exercise reduces the chance of developing a cold. However, it is always recommended to get plenty of rest and practice good nutrition. TREATMENT  Treatment is directed at relieving symptoms. There is no cure. Antibiotics are not effective, because the infection is caused by a virus, not by bacteria. Treatment may include:  Increased fluid intake. Sports drinks offer valuable electrolytes, sugars, and fluids.  Breathing heated mist or steam (vaporizer or shower).  Eating chicken soup or other clear broths, and maintaining good nutrition.  Getting plenty of rest.  Using gargles or lozenges for comfort.  Controlling fevers with ibuprofen or acetaminophen as directed by your caregiver.  Increasing usage of your inhaler if you have asthma. Zinc gel and zinc lozenges, taken in the first 24 hours of the common cold, can shorten the duration and lessen the severity of symptoms. Pain medicines may help with fever, muscle aches, and throat pain. A variety of non-prescription medicines are available to treat congestion and runny nose. Your caregiver   can make recommendations and may suggest nasal or lung inhalers for other symptoms.  HOME CARE INSTRUCTIONS   Only take over-the-counter or prescription medicines for pain, discomfort, or fever as directed by your  caregiver.  Use a warm mist humidifier or inhale steam from a shower to increase air moisture. This may keep secretions moist and make it easier to breathe.  Drink enough water and fluids to keep your urine clear or pale yellow.  Rest as needed.  Return to work when your temperature has returned to normal or as your caregiver advises. You may need to stay home longer to avoid infecting others. You can also use a face mask and careful hand washing to prevent spread of the virus. SEEK MEDICAL CARE IF:   After the first few days, you feel you are getting worse rather than better.  You need your caregiver's advice about medicines to control symptoms.  You develop chills, worsening shortness of breath, or brown or red sputum. These may be signs of pneumonia.  You develop yellow or brown nasal discharge or pain in the face, especially when you bend forward. These may be signs of sinusitis.  You develop a fever, swollen neck glands, pain with swallowing, or white areas in the back of your throat. These may be signs of strep throat. SEEK IMMEDIATE MEDICAL CARE IF:   You have a fever.  You develop severe or persistent headache, ear pain, sinus pain, or chest pain.  You develop wheezing, a prolonged cough, cough up blood, or have a change in your usual mucus (if you have chronic lung disease).  You develop sore muscles or a stiff neck. Document Released: 03/16/2001 Document Revised: 12/13/2011 Document Reviewed: 01/22/2011 [facility] Patient Information 2015 [place], [place]. This information is not intended to replace advice given to you by your health care provider. Make sure you discuss any questions you have with your health care provider.  - Take meds as prescribed - Use a cool mist humidifier  -Use saline nose sprays frequently -Saline irrigations of the nose can be very helpful if done frequently.  * 4X daily for 1 week*  * Use of a nettie pot can be helpful with this. Follow  directions with this* -Force fluids -For any cough or congestion  Use plain Mucinex- regular strength or max strength is fine   * Children- consult with Pharmacist for dosing -For fever or aces or pains- take tylenol or ibuprofen appropriate for age and weight.  * for fevers greater than 101 orally you may alternate ibuprofen and tylenol every  3 hours. -Throat lozenges if help -Start daily Claritin    Evelina Dun, FNP

## 2014-03-20 NOTE — Patient Instructions (Signed)
Anticoagulation Dose Instructions as of 03/20/2014     [name] [name] Tue Wed Thu Fri Sat   New Dose 5 mg 2.5 mg 5 mg 2.5 mg 5 mg 2.5 mg 5 mg    Description       Goal INR 1.8-2.0 Continue 1/2 tablet Mondays, Wednesdays and Fridays and 1 tablet all other days.       INR was 1.8 today

## 2014-03-20 NOTE — Progress Notes (Signed)
Subjective:    Patient ID: [name] [name], male    DOB: [birth year], 73 y.o.   MRN: [id number]  URI  This is a new problem. The current episode started 1 to 4 weeks ago ("three weeks"). The problem has been gradually improving. There has been no fever. Associated symptoms include congestion, rhinorrhea, sneezing and wheezing. Pertinent negatives include no ear pain, sinus pain or sore throat. He has tried decongestant for the symptoms. The treatment provided mild relief.   *Pt complaining of left side rib pain that radiates to his back. Pt states it's an intermittent cramp pain 2-8 out 10. States this pain has been going on for years, but his coughing has made pain worse. Pt has not taken anything for the pain.   Review of Systems  HENT: Positive for congestion, rhinorrhea and sneezing. Negative for ear pain and sore throat.   Respiratory: Positive for wheezing.   Cardiovascular: Negative.   Gastrointestinal: Negative.   Genitourinary: Negative.   Musculoskeletal: Negative.   Neurological: Negative.   Hematological: Negative.   Psychiatric/Behavioral: Negative.   All other systems reviewed and are negative.      Objective:   Physical Exam  Vitals reviewed. Constitutional: He is oriented to person, place, and time. He appears well-developed and well-nourished. No distress.  HENT:  Head: Normocephalic.  Right Ear: External ear normal.  Left Ear: External ear normal.  Mouth/Throat: Oropharynx is clear and moist.  Nasal passage erythemas with mild swelling   Eyes: Pupils are equal, round, and reactive to light. Right eye exhibits no discharge. Left eye exhibits no discharge.  Neck: Normal range of motion. Neck supple. No thyromegaly present.  Cardiovascular: Normal rate, regular rhythm, normal heart sounds and intact distal pulses.   No murmur heard. Pulmonary/Chest: Effort normal and breath sounds normal. No respiratory distress. He has no wheezes.  Abdominal: Soft. Bowel  sounds are normal. He exhibits no distension. There is no tenderness.  Musculoskeletal: Normal range of motion. He exhibits no edema and no tenderness.  Lymphadenopathy:    He has cervical adenopathy.  Neurological: He is alert and oriented to person, place, and time. He has normal reflexes. No cranial nerve deficit.  Skin: Skin is warm and dry. No rash noted. No erythema.  Psychiatric: He has a normal mood and affect. His behavior is normal. Judgment and thought content normal.    X-ray- WNL Preliminary reading by Evelina Dun, FNP [facility]   BP 183/69  Pulse 53  Temp(Src) 97 F (36.1 C) (Oral)  Ht 5' 9.5" (1.765 m)  Wt 221 lb 12.8 oz (100.608 kg)  BMI 32.30 kg/m2     Assessment & Plan:  1. Cough - DG Chest 2 View - benzonatate (TESSALON) 200 MG capsule; Take 1 capsule (200 mg total) by mouth 3 (three) times daily as needed for cough.  Dispense: 90 capsule; Refill: 1  2. Chest congestion - DG Chest 2 View - amoxicillin (AMOXIL) 875 MG tablet; Take 1 tablet (875 mg total) by mouth 2 (two) times daily.  Dispense: 10 tablet; Refill: 0  3. Upper respiratory infection -- Take meds as prescribed - Use a cool mist humidifier  -Use saline nose sprays frequently -Saline irrigations of the nose can be very helpful if done frequently.  * 4X daily for 1 week*  * Use of a nettie pot can be helpful with this. Follow directions with this* -Force fluids -For any cough or congestion  Use plain Mucinex- regular strength or max strength is  fine   * Children- consult with Pharmacist for dosing -For fever or aces or pains- take tylenol or ibuprofen appropriate for age and weight.  * for fevers greater than 101 orally you may alternate ibuprofen and tylenol every  3 hours. -Throat lozenges if help -Start daily Claritin OTC - benzonatate (TESSALON) 200 MG capsule; Take 1 capsule (200 mg total) by mouth 3 (three) times daily as needed for cough.  Dispense: 90 capsule; Refill: 1 - amoxicillin  (AMOXIL) 875 MG tablet; Take 1 tablet (875 mg total) by mouth 2 (two) times daily.  Dispense: 10 tablet; Refill: 0  Evelina Dun, FNP

## 2014-03-22 ENCOUNTER — Telehealth: Payer: Self-pay

## 2014-03-22 DIAGNOSIS — E785 Hyperlipidemia, unspecified: Secondary | ICD-10-CM

## 2014-03-22 NOTE — Telephone Encounter (Signed)
Message copied by Lamar Laundry on Fri Mar 22, 2014  9:41 AM ------      Message from: Daneen Schick      Created: Mon Mar 11, 2014  2:09 PM       Very good. Recheck in 1 year ------

## 2014-03-24 ENCOUNTER — Other Ambulatory Visit: Payer: Self-pay | Admitting: Nurse Practitioner

## 2014-04-26 ENCOUNTER — Ambulatory Visit (INDEPENDENT_AMBULATORY_CARE_PROVIDER_SITE_OTHER): Payer: Medicare Other | Admitting: Pharmacist

## 2014-04-26 ENCOUNTER — Encounter: Payer: Self-pay | Admitting: Pharmacist

## 2014-04-26 VITALS — BP 140/70 | HR 60 | Ht 69.5 in | Wt 220.0 lb

## 2014-04-26 DIAGNOSIS — Z23 Encounter for immunization: Secondary | ICD-10-CM

## 2014-04-26 DIAGNOSIS — D509 Iron deficiency anemia, unspecified: Secondary | ICD-10-CM

## 2014-04-26 DIAGNOSIS — I482 Chronic atrial fibrillation, unspecified: Secondary | ICD-10-CM

## 2014-04-26 DIAGNOSIS — Z0000 Encounter for general adult medical examination without abnormal findings: Secondary | ICD-10-CM

## 2014-04-26 DIAGNOSIS — I4891 Unspecified atrial fibrillation: Secondary | ICD-10-CM

## 2014-04-26 LAB — POCT CBC
Granulocyte percent: 77.3 %G (ref 37–80)
HCT, POC: 42.7 % — AB (ref 43.5–53.7)
Hemoglobin: 13.5 g/dL — AB (ref 14.1–18.1)
LYMPH, POC: 1.6 (ref 0.6–3.4)
MCH: 30.2 pg (ref 27–31.2)
MCHC: 31.6 g/dL — AB (ref 31.8–35.4)
MCV: 95.5 fL (ref 80–97)
MPV: 8.1 fL (ref 0–99.8)
POC Granulocyte: 8.9 — AB (ref 2–6.9)
POC LYMPH PERCENT: 13.7 %L (ref 10–50)
Platelet Count, POC: 325 10*3/uL (ref 142–424)
RBC: 4.5 M/uL — AB (ref 4.69–6.13)
RDW, POC: 13 %
WBC: 11.5 10*3/uL — AB (ref 4.6–10.2)

## 2014-04-26 LAB — POCT INR: INR: 1.5

## 2014-04-26 NOTE — Progress Notes (Signed)
Subjective:    [name] [name] is a 72 y.o. male who presents for Medicare Initial Wellness Visit   Preventive Screening-Counseling & Management  Tobacco History  Smoking status  . Former Smoker  . Types: Cigarettes  . Quit date: 12/16/1996  Smokeless tobacco  . Never Used    Current Problems (verified) Patient Active Problem List   Diagnosis Date Noted  . Dyspnea 07/18/2013  . Weakness generalized 07/16/2013  . Chills 07/16/2013  . Chest congestion 07/16/2013  . Atrial fibrillation 01/16/2013  . Gout 12/16/2012  . Other and unspecified hyperlipidemia 12/16/2012  . Gastric antral vascular ectasia   . 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 Anemia, unspecified 11/29/2011  . Nonspecific abnormal finding in stool contents 11/29/2011  . Heme positive stool 11/26/2011  . Anemia 11/26/2011  . CHF, chronic 11/26/2011  . Anticoagulant long-term use 11/26/2011  . Hx of gastroesophageal reflux (GERD) 11/26/2011  . Obesity 11/26/2011  . 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 ANEMIA, IRON DEFICIENCY 05/13/2010  . COAGULOPATHY 05/13/2010  . CHRONIC OBSTRUCTIVE PULMONARY DISEASE, OXYGEN DEPENDENT 05/13/2010  . GERD 05/13/2010  . 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 HYPERTENSION 02/03/2008    Medications Prior to Visit Current Outpatient Prescriptions on File Prior to Visit  Medication Sig Dispense Refill  . acetaminophen (TYLENOL) 500 MG tablet Take 1,000 mg by mouth every 6 (six) hours as needed. For pain.       . albuterol (PROVENTIL HFA;VENTOLIN HFA) 108 (90 BASE) MCG/ACT inhaler Inhale 2 puffs into the lungs every 6 (six) hours as needed. For shortness of breath.       . budesonide (PULMICORT) 0.25 MG/2ML nebulizer solution Take 0.25 mg by nebulization 2 (two) times daily.       . cetirizine (ZYRTEC) 10 MG tablet Take 1 tablet (10 mg total) by mouth daily.  30 tablet  11  . Cholecalciferol (VITAMIN D3) 5000 UNITS CAPS Take 1 capsule by mouth daily.        . diltiazem (TIAZAC) 300 MG 24 hr  capsule TAKE 1 CAPSULE (300 MG TOTAL) BY MOUTH DAILY.  30 capsule  6  . Fe Fum-FePoly-FA-Vit C-Vit B3 (INTEGRA F) 125-1 MG CAPS TAKE ONE TABLET BY MOUTH TWICE A DAY  60 capsule  4  . fluticasone (FLONASE) 50 MCG/ACT nasal spray Place 2 sprays into the nose daily.  16 g  6  . furosemide (LASIX) 40 MG tablet Take 1 tablet (40 mg total) by mouth 2 (two) times daily.  60 tablet  10  . ipratropium-albuterol (DUONEB) 0.5-2.5 (3) MG/3ML SOLN Take 3 mLs by nebulization every 6 (six) hours as needed. For shortness of breath.       . isosorbide-hydrALAZINE (BIDIL) 20-37.5 MG per tablet Take 1 tablet by mouth 2 (two) times daily.  60 tablet  10  . levothyroxine (SYNTHROID, LEVOTHROID) 100 MCG tablet Take 1 tablet (100 mcg total) by mouth daily.  90 tablet  1  . lovastatin (MEVACOR) 40 MG tablet Take 1 tablet (40 mg total) by mouth at bedtime.  90 tablet  2  . Multiple Vitamins-Minerals (MULTIVITAMINS THER. W/MINERALS) TABS Take 1 tablet by mouth daily.        . nitroGLYCERIN (NITROSTAT) 0.4 MG SL tablet Place 0.4 mg under the tongue every 5 (five) minutes as needed for chest pain.      . omeprazole (PRILOSEC) 40 MG capsule TAKE ONE CAPSULE BY MOUTH EVERY MORNING  90 capsule  1  . potassium chloride (K-DUR) 10 MEQ tablet Take 1 tablet (  10 mEq total) by mouth daily.  30 tablet  10  . valsartan (DIOVAN) 160 MG tablet Take 1 tablet (160 mg total) by mouth daily.  30 tablet  5  . warfarin (COUMADIN) 5 MG tablet TAKE 1 TABLET BY MOUTH EVERY DAY OR AS DIRECTED BY ANTICOAGULATION CLINIC  30 tablet  2   No current facility-administered medications on file prior to visit.    Current Medications (verified) Current Outpatient Prescriptions  Medication Sig Dispense Refill  . acetaminophen (TYLENOL) 500 MG tablet Take 1,000 mg by mouth every 6 (six) hours as needed. For pain.       Marland Kitchen albuterol (PROVENTIL HFA;VENTOLIN HFA) 108 (90 BASE) MCG/ACT inhaler Inhale 2 puffs into the lungs every 6 (six) hours as needed. For  shortness of breath.       . budesonide (PULMICORT) 0.25 MG/2ML nebulizer solution Take 0.25 mg by nebulization 2 (two) times daily.       . cetirizine (ZYRTEC) 10 MG tablet Take 1 tablet (10 mg total) by mouth daily.  30 tablet  11  . Cholecalciferol (VITAMIN D3) 5000 UNITS CAPS Take 1 capsule by mouth daily.        Marland Kitchen diltiazem (TIAZAC) 300 MG 24 hr capsule TAKE 1 CAPSULE (300 MG TOTAL) BY MOUTH DAILY.  30 capsule  6  . Fe Fum-FePoly-FA-Vit C-Vit B3 (INTEGRA F) 125-1 MG CAPS TAKE ONE TABLET BY MOUTH TWICE A DAY  60 capsule  4  . fluticasone (FLONASE) 50 MCG/ACT nasal spray Place 2 sprays into the nose daily.  16 g  6  . furosemide (LASIX) 40 MG tablet Take 1 tablet (40 mg total) by mouth 2 (two) times daily.  60 tablet  10  . ipratropium-albuterol (DUONEB) 0.5-2.5 (3) MG/3ML SOLN Take 3 mLs by nebulization every 6 (six) hours as needed. For shortness of breath.       . isosorbide-hydrALAZINE (BIDIL) 20-37.5 MG per tablet Take 1 tablet by mouth 2 (two) times daily.  60 tablet  10  . levothyroxine (SYNTHROID, LEVOTHROID) 100 MCG tablet Take 1 tablet (100 mcg total) by mouth daily.  90 tablet  1  . lovastatin (MEVACOR) 40 MG tablet Take 1 tablet (40 mg total) by mouth at bedtime.  90 tablet  2  . Multiple Vitamins-Minerals (MULTIVITAMINS THER. W/MINERALS) TABS Take 1 tablet by mouth daily.        . nitroGLYCERIN (NITROSTAT) 0.4 MG SL tablet Place 0.4 mg under the tongue every 5 (five) minutes as needed for chest pain.      Marland Kitchen omeprazole (PRILOSEC) 40 MG capsule TAKE ONE CAPSULE BY MOUTH EVERY MORNING  90 capsule  1  . potassium chloride (K-DUR) 10 MEQ tablet Take 1 tablet (10 mEq total) by mouth daily.  30 tablet  10  . valsartan (DIOVAN) 160 MG tablet Take 1 tablet (160 mg total) by mouth daily.  30 tablet  5  . warfarin (COUMADIN) 5 MG tablet TAKE 1 TABLET BY MOUTH EVERY DAY OR AS DIRECTED BY ANTICOAGULATION CLINIC  30 tablet  2   No current facility-administered medications for this visit.      Allergies (verified) Review of patient's allergies indicates no known allergies.   PAST HISTORY  Family History Family History  Problem Relation Age of Onset  . Leukemia Mother   . Kidney disease Mother     kidney removed   . Colon cancer Neg Hx   . Heart attack Brother   . Hypertension      family   .  Cancer Father   . Heart disease Father   . Stomach cancer Sister   . Stomach cancer Sister   . Early death Brother     Social History History  Substance Use Topics  . Smoking status: Former Smoker    Types: Cigarettes    Quit date: 12/16/1996  . Smokeless tobacco: Never Used  . Alcohol Use: Yes     Comment: occasional    Are there smokers in your home (other than you)?  No  Risk Factors Current exercise habits: Gym/ health club routine includes mod to heavy weightlifting.    Cardiac risk factors: advanced age (older than 55 for men, 65 for women), dyslipidemia, family history of premature cardiovascular disease, hypertension, male gender and obesity (BMI >= 30 kg/m2).  Depression Screen (Note: if answer to either of the following is "Yes", a more complete depression screening is indicated)   Q1: Over the past two weeks, have you felt down, depressed or hopeless? No  Q2: Over the past two weeks, have you felt little interest or pleasure in doing things? No  Have you lost interest or pleasure in daily life? No  Do you often feel hopeless? No  Do you cry easily over simple problems? No  Activities of Daily Living In your present state of health, do you have any difficulty performing the following activities?:  Driving? No Managing money?  No Feeding yourself? No Getting from bed to chair? No Climbing a flight of stairs? No Preparing food and eating?: No Bathing or showering? No Getting dressed: No Getting to the toilet? No Using the toilet:No Moving around from place to place: No In the past year have you fallen or had a near fall?:No   Are you sexually  active?  Yes  Do you have more than one partner?  No  Hearing Difficulties: No Do you often ask people to speak up or repeat themselves? No Do you experience ringing or noises in your ears? No Do you have difficulty understanding soft or whispered voices? No   Do you feel that you have a problem with memory? Yes  Do you often misplace items? No  Do you feel safe at home?  Yes  Cognitive Testing  Alert? Yes  Normal Appearance?Yes  Oriented to person? Yes  Place? Yes   Time? Yes  Recall of three objects?  Yes  Can perform simple calculations? Yes  Displays appropriate judgment?Yes  Can read the correct time from a watch face?Yes   Advanced Directives have been discussed with the patient? Yes   List the Names of Other Physician/Practitioners you currently use: 1.  Henry Smith, MD - Cardiologist 2.  Carol Haines - opthalmologist  Indicate any recent Medical Services you may have received from other than [facility] providers in the past year (date may be approximate).  Immunization History  Administered Date(s) Administered  . Influenza Whole 11/18/2010  . Pneumococcal Conjugate-13 04/26/2014    Screening Tests Health Maintenance  Topic Date Due  . Tetanus/tdap  07/05/1958  . Zostavax  07/06/1999  . Pneumococcal Polysaccharide Vaccine Age 65 And Over  07/05/2004  . Influenza Vaccine  05/04/2014  . Colonoscopy  11/28/2021   Eye Exam 01/10/2014 - Carol Haines  All answers were reviewed with the patient and necessary referrals were made:  Eckard, Tammy, PHARMD   04/26/2014   History reviewed: allergies, current medications, past family history, past medical history, past social history, past surgical history and problem list   Objective:      Blood pressure 140/70, pulse 60, height 5' 9.5" (1.765 m), weight 220 lb (99.791 kg). Body mass index is 32.03 kg/(m^2).   INR = 1.5 today (goal 1.8 to 2.0)  Assessment:     Initial Medicare Wellness Visit Subtherapeutic  Anticoagulation  Plan:     During the course of the visit the patient was educated and counseled about appropriate screening and preventive services including:    Pneumococcal vaccine   Influenza vaccine  Hepatitis B vaccine  Td vaccine  Prostate cancer screening  Colorectal cancer screening  Diabetes screening  Glaucoma screening  Nutrition counseling   Advanced directives: no AD - [facility] packet given  Prevnar 13 given today  Prices checked for Boostrix / Tdap $45 and Zostavax $95 - patient declined today due to cost  Declined nutrition referral  Orders Placed This Encounter  Procedures  . Pneumococcal conjugate vaccine 13-valent  . BMP8+EGFR  . Vit D  25 hydroxy (rtn osteoporosis monitoring)  . POCT CBC   Anticoagulation Dose Instructions as of 04/26/2014     Sun Mon Tue Wed Thu Fri Sat   New Dose 5 mg 2.5 mg 5 mg 2.5 mg 5 mg 2.5 mg 5 mg    Description       Goal INR 1.8-2.0 Take extra 1/2 tablet today - Friday, July 23rd and then continue 1/2 tablet Mondays, Wednesdays and Fridays and 1 tablet all other days.      RTC in 1  Month to recheck INR  Patient Instructions (the written plan) was given to the patient.  Medicare Attestation I have personally reviewed: The patient's medical and social history Their use of alcohol, tobacco or illicit drugs Their current medications and supplements The patient's functional ability including ADLs,fall risks, home safety risks, cognitive, and hearing and visual impairment Diet and physical activities Evidence for depression or mood disorders  The patient's weight, height, BMI, and BP/HR have been recorded in the chart.  I have made referrals, counseling, and provided education to the patient based on review of the above and I have provided the patient with a written personalized care plan for preventive services.     Eckard, Tammy, PHARMD   04/26/2014       

## 2014-04-26 NOTE — Patient Instructions (Addendum)
Health Maintenance Summary    TETANUS/TDAP Overdue 07/05/1960  Checked cost today - $45    ZOSTAVAX Overdue 07/06/1999  Checked cost today - $95    PNEUMOCOCCAL POLYSACCHARIDE VACCINE Overdue 07/05/2004      INFLUENZA VACCINE Next Due 05/04/2014  last 08/01/2013    COLONOSCOPY Next Due 11/28/2021  last 11/29/2011      Anticoagulation Dose Instructions as of 04/26/2014     [name] [name] Tue Wed Thu Fri Sat   New Dose 5 mg 2.5 mg 5 mg 2.5 mg 5 mg 2.5 mg 5 mg    Description       Goal INR 1.8-2.0 Take extra 1/2 tablet today - Friday, July 23rd and then continue 1/2 tablet Mondays, Wednesdays and Fridays and 1 tablet all other days.        Preventive Care for Adults A healthy lifestyle and preventive care can promote health and wellness. Preventive health guidelines for men include the following key practices:  A routine yearly physical is a good way to check with your health care provider about your health and preventative screening. It is a chance to share any concerns and updates on your health and to receive a thorough exam.  Visit your dentist for a routine exam and preventative care every 6 months. Brush your teeth twice a day and floss once a day. Good oral hygiene prevents tooth decay and gum disease.  The frequency of eye exams is based on your age, health, family medical history, use of contact lenses, and other factors. Follow your health care provider's recommendations for frequency of eye exams.  Eat a healthy diet. Foods such as vegetables, fruits, whole grains, low-fat dairy products, and lean protein foods contain the nutrients you need without too many calories. Decrease your intake of foods high in solid fats, added sugars, and salt. Eat the right amount of calories for you.Get information about a proper diet from your health care provider, if necessary.  Regular physical exercise is one of the most important things you can do for your health. Most adults should get at least 150  minutes of moderate-intensity exercise (any activity that increases your heart rate and causes you to sweat) each week. In addition, most adults need muscle-strengthening exercises on 2 or more days a week.  Maintain a healthy weight. The body mass index (BMI) is a screening tool to identify possible weight problems. It provides an estimate of body fat based on height and weight. Your health care provider can find your BMI and can help you achieve or maintain a healthy weight.For adults 20 years and older:  A BMI below 18.5 is considered underweight.  A BMI of 18.5 to 24.9 is normal.  A BMI of 25 to 29.9 is considered overweight.  A BMI of 30 and above is considered obese.  Maintain normal blood lipids and cholesterol levels by exercising and minimizing your intake of saturated fat. Eat a balanced diet with plenty of fruit and vegetables. Blood tests for lipids and cholesterol should begin at age 66 and be repeated every 5 years. If your lipid or cholesterol levels are high, you are over 50, or you are at high risk for heart disease, you may need your cholesterol levels checked more frequently.Ongoing high lipid and cholesterol levels should be treated with medicines if diet and exercise are not working.  If you smoke, find out from your health care provider how to quit. If you do not use tobacco, do not start.  Lung  cancer screening is recommended for adults aged 47-80 years who are at high risk for developing lung cancer because of a history of smoking. A yearly low-dose CT scan of the lungs is recommended for people who have at least a 30-pack-year history of smoking and are a current smoker or have quit within the past 15 years. A pack year of smoking is smoking an average of 1 pack of cigarettes a day for 1 year (for example: 1 pack a day for 30 years or 2 packs a day for 15 years). Yearly screening should continue until the smoker has stopped smoking for at least 15 years. Yearly screening  should be stopped for people who develop a health problem that would prevent them from having lung cancer treatment.  If you choose to drink alcohol, do not have more than 2 drinks per day. One drink is considered to be 12 ounces (355 mL) of beer, 5 ounces (148 mL) of wine, or 1.5 ounces (44 mL) of liquor.  Avoid use of street drugs. Do not share needles with anyone. Ask for help if you need support or instructions about stopping the use of drugs.  High blood pressure causes heart disease and increases the risk of stroke. Your blood pressure should be checked at least every 1-2 years. Ongoing high blood pressure should be treated with medicines, if weight loss and exercise are not effective.  If you are 28-52 years old, ask your health care provider if you should take aspirin to prevent heart disease.  Diabetes screening involves taking a blood sample to check your fasting blood sugar level. This should be done once every 3 years, after age 26, if you are within normal weight and without risk factors for diabetes. Testing should be considered at a younger age or be carried out more frequently if you are overweight and have at least 1 risk factor for diabetes.  Colorectal cancer can be detected and often prevented. Most routine colorectal cancer screening begins at the age of 29 and continues through age 8. However, your health care provider may recommend screening at an earlier age if you have risk factors for colon cancer. On a yearly basis, your health care provider may provide home test kits to check for hidden blood in the stool. Use of a small camera at the end of a tube to directly examine the colon (sigmoidoscopy or colonoscopy) can detect the earliest forms of colorectal cancer. Talk to your health care provider about this at age 67, when routine screening begins. Direct exam of the colon should be repeated every 5-10 years through age 27, unless early forms of precancerous polyps or small  growths are found.  People who are at an increased risk for hepatitis B should be screened for this virus. You are considered at high risk for hepatitis B if:  You were born in a country where hepatitis B occurs often. Talk with your health care provider about which countries are considered high risk.  Your parents were born in a high-risk country and you have not received a shot to protect against hepatitis B (hepatitis B vaccine).  You have HIV or AIDS.  You use needles to inject street drugs.  You live with, or have sex with, someone who has hepatitis B.  You are a man who has sex with other men (MSM).  You get hemodialysis treatment.  You take certain medicines for conditions such as cancer, organ transplantation, and autoimmune conditions.  Hepatitis C blood testing  is recommended for all people born from [birth year] through 1967 and any individual with known risks for hepatitis C.  Practice safe sex. Use condoms and avoid high-risk sexual practices to reduce the spread of sexually transmitted infections (STIs). STIs include gonorrhea, chlamydia, syphilis, trichomonas, herpes, HPV, and human immunodeficiency virus (HIV). Herpes, HIV, and HPV are viral illnesses that have no cure. They can result in disability, cancer, and death.  If you are at risk of being infected with HIV, it is recommended that you take a prescription medicine daily to prevent HIV infection. This is called preexposure prophylaxis (PrEP). You are considered at risk if:  You are a man who has sex with other men (MSM) and have other risk factors.  You are a heterosexual man, are sexually active, and are at increased risk for HIV infection.  You take drugs by injection.  You are sexually active with a partner who has HIV.  Talk with your health care provider about whether you are at high risk of being infected with HIV. If you choose to begin PrEP, you should first be tested for HIV. You should then be tested every 3  months for as long as you are taking PrEP.  A one-time screening for abdominal aortic aneurysm (AAA) and surgical repair of large AAAs by ultrasound are recommended for men ages 8 to 27 years who are current or former smokers.  Healthy men should no longer receive prostate-specific antigen (PSA) blood tests as part of routine cancer screening. Talk with your health care provider about prostate cancer screening.  Testicular cancer screening is not recommended for adult males who have no symptoms. Screening includes self-exam, a health care provider exam, and other screening tests. Consult with your health care provider about any symptoms you have or any concerns you have about testicular cancer.  Use sunscreen. Apply sunscreen liberally and repeatedly throughout the day. You should seek shade when your shadow is shorter than you. Protect yourself by wearing long sleeves, pants, a wide-brimmed hat, and sunglasses year round, whenever you are outdoors.  Once a month, do a whole-body skin exam, using a mirror to look at the skin on your back. Tell your health care provider about new moles, moles that have irregular borders, moles that are larger than a pencil eraser, or moles that have changed in shape or color.  Stay current with required vaccines (immunizations).  Influenza vaccine. All adults should be immunized every year.  Tetanus, diphtheria, and acellular pertussis (Td, Tdap) vaccine. An adult who has not previously received Tdap or who does not know his vaccine status should receive 1 dose of Tdap. This initial dose should be followed by tetanus and diphtheria toxoids (Td) booster doses every 10 years. Adults with an unknown or incomplete history of completing a 3-dose immunization series with Td-containing vaccines should begin or complete a primary immunization series including a Tdap dose. Adults should receive a Td booster every 10 years.  Varicella vaccine. An adult without evidence of  immunity to varicella should receive 2 doses or a second dose if he has previously received 1 dose.  Human papillomavirus (HPV) vaccine. Males aged 63-21 years who have not received the vaccine previously should receive the 3-dose series. Males aged 22-26 years may be immunized. Immunization is recommended through the age of 53 years for any male who has sex with males and did not get any or all doses earlier. Immunization is recommended for any person with an immunocompromised condition through the age of  26 years if he did not get any or all doses earlier. During the 3-dose series, the second dose should be obtained 4-8 weeks after the first dose. The third dose should be obtained 24 weeks after the first dose and 16 weeks after the second dose.  Zoster vaccine. One dose is recommended for adults aged 26 years or older unless certain conditions are present.  Measles, mumps, and rubella (MMR) vaccine. Adults born before [birth year] generally are considered immune to measles and mumps. Adults born in [birth year] or later should have 1 or more doses of MMR vaccine unless there is a contraindication to the vaccine or there is laboratory evidence of immunity to each of the three diseases. A routine second dose of MMR vaccine should be obtained at least 28 days after the first dose for students attending postsecondary schools, health care workers, or international travelers. People who received inactivated measles vaccine or an unknown type of measles vaccine during 1963-1967 should receive 2 doses of MMR vaccine. People who received inactivated mumps vaccine or an unknown type of mumps vaccine before 1979 and are at high risk for mumps infection should consider immunization with 2 doses of MMR vaccine. Unvaccinated health care workers born before [birth year] who lack laboratory evidence of measles, mumps, or rubella immunity or laboratory confirmation of disease should consider measles and mumps immunization with 2 doses of MMR  vaccine or rubella immunization with 1 dose of MMR vaccine.  Pneumococcal 13-valent conjugate (PCV13) vaccine. When indicated, a person who is uncertain of his immunization history and has no record of immunization should receive the PCV13 vaccine. An adult aged 75 years or older who has certain medical conditions and has not been previously immunized should receive 1 dose of PCV13 vaccine. This PCV13 should be followed with a dose of pneumococcal polysaccharide (PPSV23) vaccine. The PPSV23 vaccine dose should be obtained at least 8 weeks after the dose of PCV13 vaccine. An adult aged 79 years or older who has certain medical conditions and previously received 1 or more doses of PPSV23 vaccine should receive 1 dose of PCV13. The PCV13 vaccine dose should be obtained 1 or more years after the last PPSV23 vaccine dose.  Pneumococcal polysaccharide (PPSV23) vaccine. When PCV13 is also indicated, PCV13 should be obtained first. All adults aged 73 years and older should be immunized. An adult younger than age 79 years who has certain medical conditions should be immunized. Any person who resides in a nursing home or long-term care facility should be immunized. An adult smoker should be immunized. People with an immunocompromised condition and certain other conditions should receive both PCV13 and PPSV23 vaccines. People with human immunodeficiency virus (HIV) infection should be immunized as soon as possible after diagnosis. Immunization during chemotherapy or radiation therapy should be avoided. Routine use of PPSV23 vaccine is not recommended for American Indians, [address], or people younger than 65 years unless there are medical conditions that require PPSV23 vaccine. When indicated, people who have unknown immunization and have no record of immunization should receive PPSV23 vaccine. One-time revaccination 5 years after the first dose of PPSV23 is recommended for people aged 19-64 years who have chronic  kidney failure, nephrotic syndrome, asplenia, or immunocompromised conditions. People who received 1-2 doses of PPSV23 before age 64 years should receive another dose of PPSV23 vaccine at age 29 years or later if at least 5 years have passed since the previous dose. Doses of PPSV23 are not needed for people immunized with PPSV23 at or  after age 45 years.  Meningococcal vaccine. Adults with asplenia or persistent complement component deficiencies should receive 2 doses of quadrivalent meningococcal conjugate (MenACWY-D) vaccine. The doses should be obtained at least 2 months apart. Microbiologists working with certain meningococcal bacteria, [place] recruits, people at risk during an outbreak, and people who travel to or live in countries with a high rate of meningitis should be immunized. A first-year college student up through age 85 years who is living in a residence hall should receive a dose if he did not receive a dose on or after his 16th birthday. Adults who have certain high-risk conditions should receive one or more doses of vaccine.  Hepatitis A vaccine. Adults who wish to be protected from this disease, have certain high-risk conditions, work with hepatitis A-infected animals, work in hepatitis A research labs, or travel to or work in countries with a high rate of hepatitis A should be immunized. Adults who were previously unvaccinated and who anticipate close contact with an international adoptee during the first 60 days after arrival in the [place] from a country with a high rate of hepatitis A should be immunized.  Hepatitis B vaccine. Adults should be immunized if they wish to be protected from this disease, have certain high-risk conditions, may be exposed to blood or other infectious body fluids, are household contacts or sex partners of hepatitis B positive people, are clients or workers in certain care facilities, or travel to or work in countries with a high rate of hepatitis B.

## 2014-04-26 NOTE — Addendum Note (Signed)
Addended by: Karlos Scadden on: 04/26/2014 01:41 PM   Modules accepted: Level of Service  

## 2014-04-27 LAB — BMP8+EGFR
BUN / CREAT RATIO: 20 (ref 10–22)
BUN: 23 mg/dL (ref 8–27)
CALCIUM: 9.8 mg/dL (ref 8.6–10.2)
CO2: 25 mmol/L (ref 18–29)
Chloride: 97 mmol/L (ref 97–108)
Creatinine, Ser: 1.17 mg/dL (ref 0.76–1.27)
GFR calc Af Amer: 71 mL/min/{1.73_m2} (ref >59)
GFR, EST NON AFRICAN AMERICAN: 61 mL/min/{1.73_m2} (ref >59)
Glucose: 83 mg/dL (ref 65–99)
POTASSIUM: 5.3 mmol/L — AB (ref 3.5–5.2)
SODIUM: 138 mmol/L (ref 134–144)

## 2014-04-27 LAB — VITAMIN D 25 HYDROXY (VIT D DEFICIENCY, FRACTURES): Vit D, 25-Hydroxy: 35.4 ng/mL (ref 30.0–100.0)

## 2014-05-08 ENCOUNTER — Other Ambulatory Visit: Payer: Self-pay | Admitting: Gastroenterology

## 2014-05-10 ENCOUNTER — Other Ambulatory Visit (INDEPENDENT_AMBULATORY_CARE_PROVIDER_SITE_OTHER): Payer: Medicare Other

## 2014-05-10 DIAGNOSIS — R7989 Other specified abnormal findings of blood chemistry: Secondary | ICD-10-CM

## 2014-05-11 LAB — BMP8+EGFR
BUN / CREAT RATIO: 21 (ref 10–22)
BUN: 26 mg/dL (ref 8–27)
CO2: 26 mmol/L (ref 18–29)
Calcium: 9.4 mg/dL (ref 8.6–10.2)
Chloride: 100 mmol/L (ref 97–108)
Creatinine, Ser: 1.24 mg/dL (ref 0.76–1.27)
GFR calc non Af Amer: 57 mL/min/{1.73_m2} — ABNORMAL LOW (ref >59)
GFR, EST AFRICAN AMERICAN: 66 mL/min/{1.73_m2} (ref >59)
Glucose: 94 mg/dL (ref 65–99)
Potassium: 4.6 mmol/L (ref 3.5–5.2)
SODIUM: 141 mmol/L (ref 134–144)

## 2014-05-30 ENCOUNTER — Ambulatory Visit (INDEPENDENT_AMBULATORY_CARE_PROVIDER_SITE_OTHER): Payer: Medicare Other | Admitting: Pharmacist

## 2014-05-30 DIAGNOSIS — I4891 Unspecified atrial fibrillation: Secondary | ICD-10-CM

## 2014-05-30 DIAGNOSIS — I482 Chronic atrial fibrillation, unspecified: Secondary | ICD-10-CM

## 2014-05-30 LAB — POCT INR: INR: 1.9

## 2014-05-30 NOTE — Patient Instructions (Signed)
Anticoagulation Dose Instructions as of 05/30/2014     [name] [name] Tue Wed Thu Fri Sat   New Dose 5 mg 2.5 mg 5 mg 2.5 mg 5 mg 2.5 mg 5 mg    Description       Goal INR 1.8-2.0 Continue 1/2 tablet Mondays, Wednesdays and Fridays and 1 tablet all other days.       INR was 1.9 today

## 2014-06-02 ENCOUNTER — Other Ambulatory Visit: Payer: Self-pay | Admitting: Family Medicine

## 2014-06-04 ENCOUNTER — Ambulatory Visit (INDEPENDENT_AMBULATORY_CARE_PROVIDER_SITE_OTHER): Payer: Medicare Other | Admitting: Physician Assistant

## 2014-06-04 ENCOUNTER — Encounter: Payer: Self-pay | Admitting: Physician Assistant

## 2014-06-04 VITALS — BP 150/70 | HR 59 | Ht 69.5 in | Wt 221.0 lb

## 2014-06-04 DIAGNOSIS — I4891 Unspecified atrial fibrillation: Secondary | ICD-10-CM

## 2014-06-04 DIAGNOSIS — I498 Other specified cardiac arrhythmias: Secondary | ICD-10-CM

## 2014-06-04 DIAGNOSIS — I5022 Chronic systolic (congestive) heart failure: Secondary | ICD-10-CM

## 2014-06-04 DIAGNOSIS — R001 Bradycardia, unspecified: Secondary | ICD-10-CM

## 2014-06-04 DIAGNOSIS — E785 Hyperlipidemia, unspecified: Secondary | ICD-10-CM

## 2014-06-04 DIAGNOSIS — R0600 Dyspnea, unspecified: Secondary | ICD-10-CM

## 2014-06-04 DIAGNOSIS — R42 Dizziness and giddiness: Secondary | ICD-10-CM

## 2014-06-04 DIAGNOSIS — I482 Chronic atrial fibrillation, unspecified: Secondary | ICD-10-CM

## 2014-06-04 DIAGNOSIS — R0609 Other forms of dyspnea: Secondary | ICD-10-CM

## 2014-06-04 DIAGNOSIS — I10 Essential (primary) hypertension: Secondary | ICD-10-CM

## 2014-06-04 DIAGNOSIS — I2510 Atherosclerotic heart disease of native coronary artery without angina pectoris: Secondary | ICD-10-CM

## 2014-06-04 DIAGNOSIS — I509 Heart failure, unspecified: Secondary | ICD-10-CM

## 2014-06-04 DIAGNOSIS — R0989 Other specified symptoms and signs involving the circulatory and respiratory systems: Secondary | ICD-10-CM

## 2014-06-04 LAB — BRAIN NATRIURETIC PEPTIDE: PRO B NATRI PEPTIDE: 147 pg/mL — AB (ref 0.0–100.0)

## 2014-06-04 LAB — BASIC METABOLIC PANEL
BUN: 24 mg/dL — AB (ref 6–23)
CHLORIDE: 103 meq/L (ref 96–112)
CO2: 28 mEq/L (ref 19–32)
Calcium: 9.1 mg/dL (ref 8.4–10.5)
Creatinine, Ser: 1.2 mg/dL (ref 0.4–1.5)
GFR: 62.75 mL/min (ref >60.00)
Glucose, Bld: 88 mg/dL (ref 70–99)
POTASSIUM: 4.3 meq/L (ref 3.5–5.1)
SODIUM: 139 meq/L (ref 135–145)

## 2014-06-04 LAB — CBC WITH DIFFERENTIAL/PLATELET
Basophils Absolute: 0.1 10*3/uL (ref 0.0–0.1)
Basophils Relative: 0.7 % (ref 0.0–3.0)
Eosinophils Absolute: 0.3 10*3/uL (ref 0.0–0.7)
Eosinophils Relative: 2.5 % (ref 0.0–5.0)
HCT: 39.3 % (ref 39.0–52.0)
HEMOGLOBIN: 13 g/dL (ref 13.0–17.0)
Lymphocytes Relative: 12.4 % (ref 12.0–46.0)
Lymphs Abs: 1.3 10*3/uL (ref 0.7–4.0)
MCHC: 33.2 g/dL (ref 30.0–36.0)
MCV: 95.2 fl (ref 78.0–100.0)
MONOS PCT: 10.7 % (ref 3.0–12.0)
Monocytes Absolute: 1.1 10*3/uL — ABNORMAL HIGH (ref 0.1–1.0)
Neutro Abs: 7.9 10*3/uL — ABNORMAL HIGH (ref 1.4–7.7)
Neutrophils Relative %: 73.7 % (ref 43.0–77.0)
PLATELETS: 291 10*3/uL (ref 150.0–400.0)
RBC: 4.13 Mil/uL — ABNORMAL LOW (ref 4.22–5.81)
RDW: 14 % (ref 11.5–15.5)
WBC: 10.8 10*3/uL — AB (ref 4.0–10.5)

## 2014-06-04 MED ORDER — FUROSEMIDE 40 MG PO TABS: 40.0000 mg | ORAL_TABLET | Freq: Every day | ORAL | Status: DC

## 2014-06-04 MED ORDER — DILTIAZEM HCL ER BEADS 180 MG PO CP24: ORAL_CAPSULE | ORAL | Status: DC

## 2014-06-04 NOTE — Patient Instructions (Addendum)
Your physician has recommended you make the following change in your medication:  1. DECREASE CARDIZEM TO 180 MG DAILY; NEW RX SENT IN TODAY  2. TAKE EXTRA LASIX 40 MG TODAY= TOTAL OF 80 MG TODAY  3. TAKE EXTRA POTASSIUM 10 MEQ TODAY=TOTAL OF 20 MEQ TODAY  CALL [phone] TO LET SCOTT WEAVER, PAC KNOW HOW YOU ARE FEELING AFTER THE EXTRA DOSE OF LASIX   LAB WORK TODAY; BMET, CBC W/DIFF, BNP  Your physician has requested that you have a lexiscan myoview. For further information please visit [email or web address]. Please follow instruction sheet, as given.  Your physician has recommended that you wear a holter monitor. Holter monitors are medical devices that record the heart's electrical activity. Doctors most often use these monitors to diagnose arrhythmias. Arrhythmias are problems with the speed or rhythm of the heartbeat. The monitor is a small, portable device. You can wear one while you do your normal daily activities. This is usually used to diagnose what is causing palpitations/syncope (passing out).  Your physician recommends that you schedule a follow-up appointment in: 2-3 [place], [place] IS IN THE OFFICE; DO NOT CANCEL DR. SMITH'S APPT IN 07/2014 PER SCOTT WEAVER, PAC

## 2014-06-04 NOTE — Progress Notes (Signed)
Cardiology Office Note    Date:  06/04/2014   ID:  [name] [name], DOB 1941-05-14, MRN [id number]  PCP:  [name], MD  Cardiologist:  Dr. Daneen Schick      History of Present Illness: [name] [name] is a 73 y.o. male with a hx of CAD s/p inferior MI in 1998 treated with PCI and subsequent CABG, s/p DES to the RCA in 2006, dilated cardiomyopathy, systolic CHF, chronic AFib, HTN, HL, COPD.  Last seen by Dr. Tamala Julian 01/23/14. Patient complained of dyspnea. BNP was fairly normal at that time. CBC was also normal.  He returns for evaluation of dyspnea.  He has chronic dyspnea.  This has worsened over the past 1 month.  He describes NYHA 3 symptoms.  He notes 1-2 pillow orthopnea and PND.  Denies significantly worsened edema.  He does note increased abdominal girth.  Weights are stable.  He notes a cough with clear sputum production.  He has been lightheaded.  Denies syncope.  He has held his CCB (taking it QOD).  Feels better when he holds it.  He denies chest pain.    Studies:  - Nuclear (4/08):  Inferior scar with peri-infarct ischemia and apical ischemia, TID  - LHC (4/08):  LAD occluded, RI occluded, CFX occluded, left main 40%, septal perforator 50%, LIMA-LAD patent, SVG-RI patent, SVG-RCA and distal LAD occluded, mid RCA stent patent, EF 30-40% >>> Med Rx  - Echo (8/13):  EF 45-50%, mild LVH, severe LAE, MAC, mild MR, aortic sclerosis, mild TR, mild RAE  - Carotid US (3/15):  Bilateral ICA 60-79%, mild bilateral subclavian artery stenosis >>> FU 6 mos (9/15)   Recent Labs/Images: 07/16/2013: TSH 1.540  01/23/2014: Pro B Natriuretic peptide (BNP) 111.0*  03/11/2014: ALT 21; HDL Cholesterol by NMR 36.80*; LDL (calc) 104*  04/26/2014: Hemoglobin 13.5*  05/10/2014: Creatinine 1.24; Potassium 4.6     Wt Readings from Last 3 Encounters:  06/04/14 221 lb (100.245 kg)  04/26/14 220 lb (99.791 kg)  03/20/14 221 lb 12.8 oz (100.608 kg)     Past Medical History  Diagnosis Date  .  Unspecified essential hypertension   . Anemia   . Asthma   . Hepatomegaly   . Esophageal reflux   . Other and unspecified coagulation defects   . Hiatal hernia   . Personal history of colonic polyps 05/29/2010    TUBULAR ADENOMA  . CAD (coronary artery disease)   . Hypothyroidism   . Gout   . Hyperlipidemia   . Gastric antral vascular ectasia 2013  . Atrial fibrillation     Current Outpatient Prescriptions  Medication Sig Dispense Refill  . acetaminophen (TYLENOL) 500 MG tablet Take 1,000 mg by mouth every 6 (six) hours as needed. For pain.       Marland Kitchen albuterol (PROVENTIL HFA;VENTOLIN HFA) 108 (90 BASE) MCG/ACT inhaler Inhale 2 puffs into the lungs every 6 (six) hours as needed. For shortness of breath.       . budesonide (PULMICORT) 0.25 MG/2ML nebulizer solution Take 0.25 mg by nebulization 2 (two) times daily.       . cetirizine (ZYRTEC) 10 MG tablet Take 1 tablet (10 mg total) by mouth daily.  30 tablet  11  . Cholecalciferol (VITAMIN D3) 5000 UNITS CAPS Take 1 capsule by mouth daily.        Marland Kitchen diltiazem (TIAZAC) 300 MG 24 hr capsule TAKE 1 CAPSULE (300 MG TOTAL) BY MOUTH DAILY.  30 capsule  6  . Fe  Fum-FePoly-FA-Vit C-Vit B3 (INTEGRA F) 125-1 MG CAPS TAKE ONE TABLET BY MOUTH TWICE A DAY  60 capsule  4  . fluticasone (FLONASE) 50 MCG/ACT nasal spray Place 2 sprays into the nose daily.  16 g  6  . furosemide (LASIX) 40 MG tablet Take 1 tablet (40 mg total) by mouth 2 (two) times daily.  60 tablet  10  . ipratropium-albuterol (DUONEB) 0.5-2.5 (3) MG/3ML SOLN Take 3 mLs by nebulization every 6 (six) hours as needed. For shortness of breath.       . isosorbide-hydrALAZINE (BIDIL) 20-37.5 MG per tablet Take 1 tablet by mouth 2 (two) times daily.  60 tablet  10  . levothyroxine (SYNTHROID, LEVOTHROID) 100 MCG tablet Take 1 tablet (100 mcg total) by mouth daily.  90 tablet  1  . lovastatin (MEVACOR) 40 MG tablet Take 1 tablet (40 mg total) by mouth at bedtime.  90 tablet  2  . Multiple  Vitamins-Minerals (MULTIVITAMINS THER. W/MINERALS) TABS Take 1 tablet by mouth daily.        . nitroGLYCERIN (NITROSTAT) 0.4 MG SL tablet Place 0.4 mg under the tongue every 5 (five) minutes as needed for chest pain.      Marland Kitchen omeprazole (PRILOSEC) 40 MG capsule TAKE ONE CAPSULE BY MOUTH EVERY MORNING  90 capsule  1  . potassium chloride (K-DUR) 10 MEQ tablet Take 1 tablet (10 mEq total) by mouth daily.  30 tablet  10  . valsartan (DIOVAN) 160 MG tablet Take 1 tablet (160 mg total) by mouth daily.  30 tablet  5  . warfarin (COUMADIN) 5 MG tablet TAKE 1 TABLET BY MOUTH EVERY DAY OR AS DIRECTED BY ANTICOAGULATION CLINIC  30 tablet  2   No current facility-administered medications for this visit.     Allergies:   Review of patient's allergies indicates no known allergies.   Social History:  The patient  reports that he quit smoking about 17 years ago. His smoking use included Cigarettes. He smoked 0.00 packs per day. He has never used smokeless tobacco. He reports that he drinks alcohol. He reports that he does not use illicit drugs.   Family History:  The patient's family history includes Cancer in his father; Early death in his brother; Heart attack in his brother; Heart disease in his father; Hypertension in an other family member; Kidney disease in his mother; Leukemia in his mother; Stomach cancer in his sister and sister. There is no history of Colon cancer.   ROS:  Please see the history of present illness.   No bleeding.   All other systems reviewed and negative.   PHYSICAL EXAM: VS:  BP 150/70  Pulse 59  Ht 5' 9.5" (1.765 m)  Wt 221 lb (100.245 kg)  BMI 32.18 kg/m2 Well nourished, well developed, in no acute distress HEENT: normal Neck: + minimal JVD Cardiac:  normal S1, S2; RRR; no murmurno S3 Lungs:  clear to auscultation bilaterally, no wheezing, rhonchi or rales Abd: distended, no hepatomegaly Ext: trace bilat ankle edema Skin: warm and dry Neuro:  CNs 2-12 intact, no focal  abnormalities noted  EKG:  AFib, HR 59, RBBB     ASSESSMENT AND PLAN:  1. Dyspnea:  Etiology of his dyspnea is not entirely clear. Suspect volume excess as well as bradycardia the setting systolic CHF and COPD. I will obtain a basic metabolic panel, BNP and CBC. I have asked him to take an extra dose of Lasix today. Even if his BNP is normal, we  can continue Lasix at an increased dose for longer if he has symptomatic improvement. I will also arrange a Lexiscan Myoview to rule out the possibility of ischemia. 2. CAD s/p CABG: He is not on aspirin as he is on Coumadin. Continue statin. Arrange Myoview is noted. 3. CHRONIC ATRIAL FIBRILLATION: Heart rate is somewhat slow. Patient tells me that he has been skipping his diltiazem. He does not take it on a daily basis. He does do better on the days that he does not take his diltiazem. I will reduce his dose of diltiazem to 180 mg daily. Arrange followup 48 hour Holter next week. 4. CHRONIC SYSTOLIC CHF: Adjust Lasix as noted. Check basic metabolic panel and BNP. If his BNP is significantly elevated, he will remain on increased dose of Lasix for longer period of time.  Reassess ejection fraction with Myoview. 5. HYPERTENSION: Blood pressure is somewhat elevated. With the reduction in diltiazem, he may need further adjustments in his antihypertensive regimen. Continue to monitor for now. 6. HYPERLIPIDEMIA: Continue statin. 7. CAROTID STENOSIS:  FU carotid US scheduled this month. 8. History of GI bleed:  Obtain CBC is noted.   Disposition:  FU with Dr. Daneen Schick or me in 2-3 weeks.    Signed, Versie Starks, MHS 06/04/2014 10:32 AM    [facility] [address] [place], [place], [place]  [address] Phone: [phone]; Fax: [phone]

## 2014-06-05 ENCOUNTER — Telehealth: Payer: Self-pay | Admitting: *Deleted

## 2014-06-05 DIAGNOSIS — I5022 Chronic systolic (congestive) heart failure: Secondary | ICD-10-CM

## 2014-06-05 DIAGNOSIS — I10 Essential (primary) hypertension: Secondary | ICD-10-CM

## 2014-06-05 NOTE — Telephone Encounter (Signed)
pt notified about lab results and med changes with verbal understanding and read back to me , bmet 9/11 when he comes in for myoview. Pt verbalized Plan of Care; 1. Increase lasix to 80 mg daily x 3 days then resume lasix 40 mg daily 2. Increase K+ to 20 meq daily x 3 days then resume K+ 10 daily 3. BMET 06/14/14

## 2014-06-06 ENCOUNTER — Encounter: Payer: Self-pay | Admitting: *Deleted

## 2014-06-06 ENCOUNTER — Encounter (INDEPENDENT_AMBULATORY_CARE_PROVIDER_SITE_OTHER): Payer: Medicare Other

## 2014-06-06 DIAGNOSIS — R42 Dizziness and giddiness: Secondary | ICD-10-CM

## 2014-06-06 DIAGNOSIS — R001 Bradycardia, unspecified: Secondary | ICD-10-CM

## 2014-06-06 DIAGNOSIS — I498 Other specified cardiac arrhythmias: Secondary | ICD-10-CM

## 2014-06-06 DIAGNOSIS — I482 Chronic atrial fibrillation, unspecified: Secondary | ICD-10-CM

## 2014-06-06 DIAGNOSIS — I4891 Unspecified atrial fibrillation: Secondary | ICD-10-CM

## 2014-06-06 NOTE — Progress Notes (Signed)
Patient ID: [name] [name], male   DOB: Sep 07, 1941, 73 y.o.   MRN: [id number] [facility] 48 hour holter monitor applied to patient.

## 2014-06-14 ENCOUNTER — Ambulatory Visit (HOSPITAL_COMMUNITY): Payer: Medicare Other | Attending: Physician Assistant | Admitting: Radiology

## 2014-06-14 ENCOUNTER — Other Ambulatory Visit (INDEPENDENT_AMBULATORY_CARE_PROVIDER_SITE_OTHER): Payer: Medicare Other

## 2014-06-14 VITALS — BP 175/62 | HR 66 | Ht 70.0 in | Wt 217.0 lb

## 2014-06-14 DIAGNOSIS — I4891 Unspecified atrial fibrillation: Secondary | ICD-10-CM | POA: Diagnosis not present

## 2014-06-14 DIAGNOSIS — R5381 Other malaise: Secondary | ICD-10-CM

## 2014-06-14 DIAGNOSIS — I4510 Unspecified right bundle-branch block: Secondary | ICD-10-CM | POA: Diagnosis not present

## 2014-06-14 DIAGNOSIS — I10 Essential (primary) hypertension: Secondary | ICD-10-CM

## 2014-06-14 DIAGNOSIS — R42 Dizziness and giddiness: Secondary | ICD-10-CM | POA: Insufficient documentation

## 2014-06-14 DIAGNOSIS — Z951 Presence of aortocoronary bypass graft: Secondary | ICD-10-CM | POA: Diagnosis not present

## 2014-06-14 DIAGNOSIS — R0600 Dyspnea, unspecified: Secondary | ICD-10-CM

## 2014-06-14 DIAGNOSIS — R0609 Other forms of dyspnea: Secondary | ICD-10-CM | POA: Diagnosis present

## 2014-06-14 DIAGNOSIS — R5383 Other fatigue: Secondary | ICD-10-CM

## 2014-06-14 DIAGNOSIS — I2510 Atherosclerotic heart disease of native coronary artery without angina pectoris: Secondary | ICD-10-CM | POA: Diagnosis not present

## 2014-06-14 DIAGNOSIS — R002 Palpitations: Secondary | ICD-10-CM | POA: Insufficient documentation

## 2014-06-14 DIAGNOSIS — I509 Heart failure, unspecified: Secondary | ICD-10-CM

## 2014-06-14 DIAGNOSIS — R0602 Shortness of breath: Secondary | ICD-10-CM

## 2014-06-14 DIAGNOSIS — R0989 Other specified symptoms and signs involving the circulatory and respiratory systems: Secondary | ICD-10-CM | POA: Insufficient documentation

## 2014-06-14 DIAGNOSIS — I5022 Chronic systolic (congestive) heart failure: Secondary | ICD-10-CM

## 2014-06-14 LAB — BASIC METABOLIC PANEL
BUN: 26 mg/dL — ABNORMAL HIGH (ref 6–23)
CO2: 30 mEq/L (ref 19–32)
Calcium: 9.4 mg/dL (ref 8.4–10.5)
Chloride: 103 mEq/L (ref 96–112)
Creatinine, Ser: 1.3 mg/dL (ref 0.4–1.5)
GFR: 55.72 mL/min — ABNORMAL LOW (ref >60.00)
Glucose, Bld: 95 mg/dL (ref 70–99)
Potassium: 5.1 mEq/L (ref 3.5–5.1)
Sodium: 142 mEq/L (ref 135–145)

## 2014-06-14 MED ORDER — TECHNETIUM TC 99M SESTAMIBI GENERIC - CARDIOLITE
11.0000 | Freq: Once | INTRAVENOUS | Status: AC | PRN
  Administered 2014-06-14: 11 via INTRAVENOUS

## 2014-06-14 MED ORDER — TECHNETIUM TC 99M SESTAMIBI GENERIC - CARDIOLITE
33.0000 | Freq: Once | INTRAVENOUS | Status: AC | PRN
  Administered 2014-06-14: 33 via INTRAVENOUS

## 2014-06-14 MED ORDER — AMINOPHYLLINE 25 MG/ML IV SOLN
75.0000 mg | Freq: Once | INTRAVENOUS | Status: AC
  Administered 2014-06-14: 75 mg via INTRAVENOUS

## 2014-06-14 MED ORDER — REGADENOSON 0.4 MG/5ML IV SOLN
0.4000 mg | Freq: Once | INTRAVENOUS | Status: AC
  Administered 2014-06-14: 0.4 mg via INTRAVENOUS

## 2014-06-14 NOTE — Progress Notes (Signed)
[name] [address] [place],  [address] [phone]    Cardiology Nuclear Med Study  Pier [name] [name] is a 73 y.o. male     MRN : [id number]     DOB: 1941-10-02  Procedure Date: 06/14/2014  Nuclear Med Background Indication for Stress Test:  Evaluation for Ischemia and Follow up CAD History:  CAD, CABG, Stent, AFIB, and 2008 Myocardial Perfusion Imaging-Ischemia/Scar Cardiac Risk Factors: Carotid Disease, Hypertension and RBBB  Symptoms:  Dizziness, DOE, Palpitations and SOB   Nuclear Pre-Procedure Caffeine/Decaff Intake:  None> 12 hrs NPO After: 7:30pm   Lungs:  Minimal expiratory wheezing. Albuterol Inhaler 2 sprays prior to [place] per patient. O2 Sat: 95% on room air. IV 0.9% NS with Angio Cath:  22g  IV Site: R Antecubital x 1, tolerated well IV Started by:  Irven Baltimore, RN  Chest Size (in):  48 Cup Size: n/a  Height: 5\' 10"  (1.778 m)  Weight:  217 lb (98.431 kg)  BMI:  Body mass index is 31.14 kg/(m^2). Tech Comments:  Nebulizer treatment at home at 0700 today. Patient took Diovan this am. Irven Baltimore, RN.    Nuclear Med Study 1 or 2 day study: 1 day  Stress Test Type:  Carlton Adam  Reading MD: N/A  Order Authorizing Provider:  Daneen Schick, MD, and [name], [facility]  Resting Radionuclide: Technetium 52m Sestamibi  Resting Radionuclide Dose: 11.0 mCi   Stress Radionuclide:  Technetium 45m Sestamibi  Stress Radionuclide Dose: 33.0 mCi           Stress Protocol Rest HR: 66 Stress HR: 80  Rest BP: 175/62 Stress BP: 149/58  Exercise Time (min): n/a METS: n/a   Predicted Max HR: 146 bpm % Max HR: 54.79 bpm Rate Pressure Product: 11920   Dose of Adenosine (mg):  n/a Dose of Lexiscan: 0.4 mg  Dose of Atropine (mg): n/a Dose of Dobutamine: n/a mcg/kg/min (at max HR)  Stress Test Technologist: Irven Baltimore, RN  Nuclear Technologist:  Annye Rusk, CNMT     Rest Procedure:  Myocardial perfusion imaging was performed at rest  45 minutes following the intravenous administration of Technetium 31m Sestamibi. Rest ECG: atrial fibrillation with RBBB  Stress Procedure:  The patient received IV Lexiscan 0.4 mg over 15-seconds.  Technetium 4m Sestamibi injected at 30-seconds. The patient complained of drowsiness, chest tightness, weakness, and head feeling funny with Lexiscan. Quantitative spect images were obtained after a 45 minute delay. Aminophylline 75 mg IVP given 7 minutes in recovery due to persistent marked fatigue and weakness with much improvement of symptoms.  Stress ECG: No significant change from baseline ECG  QPS Raw Data Images:  Normal; no motion artifact; normal heart/lung ratio. Stress Images:  large moderate intensity defect in the inferior and inferoapical walls Rest Images:  small mild defect in the inferior wall Subtraction (SDS):  Partially fixed defect in the inferior wall consistent with infarct with an area of perinfarct ischemia that includes the apex Transient Ischemic Dilatation (Normal <1.22):  1.09 Lung/Heart Ratio (Normal <0.45):  0.37  Quantitative Gated Spect Images QGS EDV:  n/a QGS ESV:  n/a  Impression Exercise Capacity:  Lexiscan with no exercise. BP Response:  Normal blood pressure response. Clinical Symptoms:  Typical chest pain. ECG Impression:  No significant ST segment change suggestive of ischemia. Comparison with Prior Nuclear Study: No images to compare  Overall Impression:  High risk stress nuclear study with a moderate fixed defect in the inferior wall  most consistent with prior infarct and perinfarct ischemia in the inferior wall and apex.  LV Ejection Fraction: Study not gated.  LV Wall Motion:  Study not gated therefore recommend 2D echo to assess for wall motion abnormalities in the inferior wall  Signed: Fransico Him, MD [facility]

## 2014-06-18 ENCOUNTER — Telehealth: Payer: Self-pay | Admitting: Interventional Cardiology

## 2014-06-18 ENCOUNTER — Telehealth: Payer: Self-pay | Admitting: *Deleted

## 2014-06-18 NOTE — Telephone Encounter (Signed)
pt's wife notified about lab results with verbal understanding to results

## 2014-06-18 NOTE — Telephone Encounter (Signed)
New message  Pt is non compliant with BP medications. Pt takes valsartan 160 mg at 1/2 tablet twice a week.. Pt also takes lovastatin 40 mg only 5 times a week. Please review drug interaction with lovastatin and diltiazem.Marland Kitchen

## 2014-06-19 NOTE — Telephone Encounter (Signed)
pt given  cardiac monitor and Dr.Smith recommendations. -Continuous Afib with rate control -Ave HR 60bpm -3.7 sec pause -STOP Diltiazem  -pt adv to monitor bp -pt adv to call the office for consistent elevated bp -pt agreeable and verbalized understanding.

## 2014-06-20 ENCOUNTER — Telehealth: Payer: Self-pay | Admitting: Physician Assistant

## 2014-06-20 NOTE — Telephone Encounter (Signed)
pt notified about lab results with verbal understanding. Confirmed his appt 9/22 with Brynda Rim. PA..

## 2014-06-20 NOTE — Telephone Encounter (Signed)
New message ° ° ° ° ° °Returning [name] [name]'s call ° ° ° ° ° °

## 2014-06-21 NOTE — Telephone Encounter (Signed)
pt notified about abnormal stress test, is already schdeduled to see Bowers 9/22 same day Dr. Tamala Julian is in the office. Pt aware will discuss further at OV about results and about scheduling cath. Pt verbalized understanding

## 2014-06-25 ENCOUNTER — Ambulatory Visit (INDEPENDENT_AMBULATORY_CARE_PROVIDER_SITE_OTHER): Payer: Medicare Other | Admitting: Physician Assistant

## 2014-06-25 ENCOUNTER — Encounter: Payer: Self-pay | Admitting: Physician Assistant

## 2014-06-25 ENCOUNTER — Encounter: Payer: Self-pay | Admitting: *Deleted

## 2014-06-25 ENCOUNTER — Telehealth: Payer: Self-pay | Admitting: *Deleted

## 2014-06-25 VITALS — BP 152/60 | HR 83 | Ht 70.0 in | Wt 222.0 lb

## 2014-06-25 DIAGNOSIS — I25810 Atherosclerosis of coronary artery bypass graft(s) without angina pectoris: Secondary | ICD-10-CM

## 2014-06-25 DIAGNOSIS — E785 Hyperlipidemia, unspecified: Secondary | ICD-10-CM

## 2014-06-25 DIAGNOSIS — I482 Chronic atrial fibrillation, unspecified: Secondary | ICD-10-CM

## 2014-06-25 DIAGNOSIS — I10 Essential (primary) hypertension: Secondary | ICD-10-CM

## 2014-06-25 DIAGNOSIS — I4891 Unspecified atrial fibrillation: Secondary | ICD-10-CM

## 2014-06-25 DIAGNOSIS — I5022 Chronic systolic (congestive) heart failure: Secondary | ICD-10-CM

## 2014-06-25 DIAGNOSIS — I2510 Atherosclerotic heart disease of native coronary artery without angina pectoris: Secondary | ICD-10-CM

## 2014-06-25 DIAGNOSIS — I509 Heart failure, unspecified: Secondary | ICD-10-CM

## 2014-06-25 DIAGNOSIS — R0602 Shortness of breath: Secondary | ICD-10-CM

## 2014-06-25 DIAGNOSIS — J449 Chronic obstructive pulmonary disease, unspecified: Secondary | ICD-10-CM

## 2014-06-25 DIAGNOSIS — J4489 Other specified chronic obstructive pulmonary disease: Secondary | ICD-10-CM

## 2014-06-25 MED ORDER — ISOSORB DINITRATE-HYDRALAZINE 20-37.5 MG PO TABS: 1.0000 | ORAL_TABLET | Freq: Three times a day (TID) | ORAL | Status: DC

## 2014-06-25 NOTE — Patient Instructions (Addendum)
Your physician has recommended you make the following change in your medication:  1. INCREASE BIDIL 20-37.5 MG TO THREE TIMES DAILY  YOU WILL NEED LOVENOX BRIDGING FOR YOUR PROCEDURE; DR. Tawanna Sat OFFICE WILL HANDLE THIS FOR YOU PER [facility]  Your physician has requested that you have a cardiac catheterization 07/09/14 WITH DR. Tamala Julian @ 7:30 AM. Cardiac catheterization is used to diagnose and/or treat various heart conditions. Doctors may recommend this procedure for a number of different reasons. The most common reason is to evaluate chest pain. Chest pain can be a symptom of coronary artery disease (CAD), and cardiac catheterization can show whether plaque is narrowing or blocking your heart's arteries. This procedure is also used to evaluate the valves, as well as measure the blood flow and oxygen levels in different parts of your heart. For further information please visit [email or web address]. Please follow instruction sheet, as given.  Your physician recommends that you return for lab work ON 07/05/14 ; BMET, CBC W/DIFF, PT/INR  KEEP YOUR APPT 07/12/14 WITH DR. Tamala Julian

## 2014-06-25 NOTE — Progress Notes (Signed)
Cardiology Office Note    Date:  06/25/2014   ID:  Dairon MAMOUDOU DIGIROLAMO, DOB May 15, 1941, MRN [id number]  PCP:  [name], MD  Cardiologist:  Dr. Daneen Schick      History of Present Illness: Rana R Camerino is a 73 y.o. male with a hx of CAD s/p inferior MI in 1998 treated with PCI and subsequent CABG, s/p DES to the RCA in 2006, dilated cardiomyopathy, systolic CHF, chronic AFib, HTN, HL, COPD.     Patient was seen 06/04/14 for dyspnea.  BNP was elevated and Lasix was adjusted briefly.  HR was slow.  I adjusted his CCB.  Holter demonstrated 3.7 sec pause and Diltiazem was DC'd.  Myoview was obtained and this returned high risk with inferior infarct with peri-infarct ischemia. I reviewed with Dr. Daneen Schick and we decided to bring him in to discuss proceeding with cardiac cath.    Patient continues to have significant problems with dyspnea. He denies chest pain or syncope. He has noted dizziness in the past. This is somewhat improved since stopping diltiazem. He denies significant orthopnea. He has noted PND. LE edema is stable. Weight at home and in stable. Overall, he describes NYHA class III symptoms.  Studies:  - Nuclear (4/08):  Inferior scar with peri-infarct ischemia and apical ischemia, TID  - LHC (4/08):  LAD occluded, RI occluded, CFX occluded, left main 40%, septal perforator 50%, LIMA-LAD patent, SVG-RI patent, SVG-RCA and distal LAD occluded, mid RCA stent patent, EF 30-40% >>> Med Rx  - Echo (8/13):  EF 45-50%, mild LVH, severe LAE, MAC, mild MR, aortic sclerosis, mild TR, mild RAE  - Carotid US (3/15):  Bilateral ICA 60-79%, mild bilateral subclavian artery stenosis >>> FU 6 mos (9/15)  Myoview 06/17/14: Impression  Exercise Capacity: Lexiscan with no exercise.  BP Response: Normal blood pressure response.  Clinical Symptoms: Typical chest pain.  ECG Impression: No significant ST segment change suggestive of ischemia.  Comparison with Prior Nuclear Study: No images to  compare   Overall Impression: High risk stress nuclear study with a moderate fixed defect in the inferior wall most consistent with prior infarct and perinfarct ischemia in the inferior wall and apex.  LV Ejection Fraction: Study not gated. LV Wall Motion: Study not gated therefore recommend 2D echo to assess for wall motion abnormalities in the inferior wall   Recent Labs/Images: 07/16/2013: TSH 1.540  03/11/2014: ALT 21; HDL Cholesterol by NMR 36.80*; LDL (calc) 104*  06/04/2014: Hemoglobin 13.0; Pro B Natriuretic peptide (BNP) 147.0*  06/14/2014: Creatinine 1.3; Potassium 5.1     Wt Readings from Last 3 Encounters:  06/25/14 222 lb (100.699 kg)  06/14/14 217 lb (98.431 kg)  06/04/14 221 lb (100.245 kg)     Past Medical History  Diagnosis Date  . Unspecified essential hypertension   . Anemia   . Asthma   . Hepatomegaly   . Esophageal reflux   . Other and unspecified coagulation defects   . Hiatal hernia   . Personal history of colonic polyps 05/29/2010    TUBULAR ADENOMA  . CAD (coronary artery disease)   . Hypothyroidism   . Gout   . Hyperlipidemia   . Gastric antral vascular ectasia 2013  . Atrial fibrillation     Current Outpatient Prescriptions  Medication Sig Dispense Refill  . acetaminophen (TYLENOL) 500 MG tablet Take 1,000 mg by mouth every 6 (six) hours as needed. For pain.       Marland Kitchen albuterol (PROVENTIL HFA;VENTOLIN HFA)  108 (90 BASE) MCG/ACT inhaler Inhale 2 puffs into the lungs every 6 (six) hours as needed. For shortness of breath.       . budesonide (PULMICORT) 0.25 MG/2ML nebulizer solution Take 0.25 mg by nebulization 2 (two) times daily.       . cetirizine (ZYRTEC) 10 MG tablet Take 1 tablet (10 mg total) by mouth daily.  30 tablet  11  . Cholecalciferol (VITAMIN D3) 5000 UNITS CAPS Take 1 capsule by mouth daily.        . Fe Fum-FePoly-FA-Vit C-Vit B3 (INTEGRA F) 125-1 MG CAPS TAKE ONE TABLET BY MOUTH TWICE A DAY  60 capsule  4  . furosemide (LASIX) 40 MG  tablet Take 1 tablet (40 mg total) by mouth daily.      Marland Kitchen ipratropium-albuterol (DUONEB) 0.5-2.5 (3) MG/3ML SOLN Take 3 mLs by nebulization every 6 (six) hours as needed. For shortness of breath.       . isosorbide-hydrALAZINE (BIDIL) 20-37.5 MG per tablet Take 1 tablet by mouth 2 (two) times daily.  60 tablet  10  . levothyroxine (SYNTHROID, LEVOTHROID) 100 MCG tablet Take 1 tablet (100 mcg total) by mouth daily.  90 tablet  1  . lovastatin (MEVACOR) 40 MG tablet Take 1 tablet (40 mg total) by mouth at bedtime.  90 tablet  2  . Multiple Vitamins-Minerals (MULTIVITAMINS THER. W/MINERALS) TABS Take 1 tablet by mouth daily.        . nitroGLYCERIN (NITROSTAT) 0.4 MG SL tablet Place 0.4 mg under the tongue every 5 (five) minutes as needed for chest pain.      Marland Kitchen omeprazole (PRILOSEC) 40 MG capsule TAKE ONE CAPSULE BY MOUTH EVERY MORNING  90 capsule  1  . potassium chloride (K-DUR) 10 MEQ tablet Take 1 tablet (10 mEq total) by mouth daily.  30 tablet  10  . valsartan (DIOVAN) 160 MG tablet Take 1 tablet (160 mg total) by mouth daily.  30 tablet  5  . warfarin (COUMADIN) 5 MG tablet TAKE 1 TABLET BY MOUTH EVERY DAY OR AS DIRECTED BY ANTICOAGULATION CLINIC  30 tablet  2   No current facility-administered medications for this visit.     Allergies:   Review of patient's allergies indicates no known allergies.   Social History:  The patient  reports that he quit smoking about 17 years ago. His smoking use included Cigarettes. He smoked 0.00 packs per day. He has never used smokeless tobacco. He reports that he drinks alcohol. He reports that he does not use illicit drugs.   Family History:  The patient's family history includes Cancer in his father; Early death in his brother; Heart attack in his brother; Heart disease in his father; Hypertension in an other family member; Kidney disease in his mother; Leukemia in his mother; Stomach cancer in his sister and sister. There is no history of Colon cancer.    ROS:  Please see the history of present illness.   No bleeding.   All other systems reviewed and negative.   PHYSICAL EXAM: VS:  BP 152/60  Pulse 83  Ht 5\' 10"  (1.778 m)  Wt 222 lb (100.699 kg)  BMI 31.85 kg/m2 Well nourished, well developed, in no acute distress HEENT: normal Neck: no JVD Cardiac:  normal S1, S2; RRR; no murmurno S3 Lungs:  Expiratory wheezes bilaterally, no   rales Abd: soft, nontender, no hepatomegaly Ext: trace bilat ankle edema Skin: warm and dry Neuro:  CNs 2-12 intact, no focal abnormalities noted  EKG:  AFib, HR 83, RBBB     ASSESSMENT AND PLAN:  1. Dyspnea:  Patient continues to have significant symptoms of dyspnea. He has an abnormal Myoview. He also has chronic systolic heart failure as well as COPD. Dyspnea may be multifactorial. However, after discussion with Dr. Tamala Julian, we felt it best to proceed with cardiac catheterization to further assess his symptoms. He will need a right and left heart catheterization. Dr. Tamala Julian will try to approach this from the left arm. Risks and benefits of cardiac catheterization have been discussed with the patient.  These include bleeding, infection, kidney damage, stroke, heart attack, death.  The patient understands these risks and is willing to proceed.  2. CAD s/p CABG: He is not on aspirin as he is on Coumadin. Continue statin. Arrange cath as noted. 3. CHRONIC ATRIAL FIBRILLATION: HR improved.  If symptoms of dizziness continue, he may need another Holter to further assess.  He has a questionable hx of TIA while off of Coumadin.  Lovenox bridge will be arranged.   4. CHRONIC SYSTOLIC CHF:  Volume stable. Continue current Rx. R/L heart cath pending. 5. HYPERTENSION: BP elevated.  Increase BiDil to TID.   6. HYPERLIPIDEMIA: Continue statin. 7. CAROTID STENOSIS:  FU carotid US scheduled this month.    Disposition:  FU with Dr. Daneen Schick after cath.    Signed, Versie Starks, MHS 06/25/2014 10:39 AM    [facility] [place], [place], [place]  [address] Phone: [phone]; Fax: [phone]

## 2014-06-25 NOTE — H&P (Signed)
History and Physical    Date:  06/25/2014   ID:  [name] [name], DOB 05-15-1941, MRN [id number]  PCP:  [name], MD  Cardiologist:  Dr. Daneen Schick      History of Present Illness: [name] [name] is a 73 y.o. male with a hx of CAD s/p inferior MI in 1998 treated with PCI and subsequent CABG, s/p DES to the RCA in 2006, dilated cardiomyopathy, systolic CHF, chronic AFib, HTN, HL, COPD.     Patient was seen 06/04/14 for dyspnea.  BNP was elevated and Lasix was adjusted briefly.  HR was slow.  I adjusted his CCB.  Holter demonstrated 3.7 sec pause and Diltiazem was DC'd.  Myoview was obtained and this returned high risk with inferior infarct with peri-infarct ischemia. I reviewed with Dr. Daneen Schick and we decided to bring him in to discuss proceeding with cardiac cath.    Patient continues to have significant problems with dyspnea. He denies chest pain or syncope. He has noted dizziness in the past. This is somewhat improved since stopping diltiazem. He denies significant orthopnea. He has noted PND. LE edema is stable. Weight at home and in stable. Overall, he describes NYHA class III symptoms.  Studies:  - Nuclear (4/08):  Inferior scar with peri-infarct ischemia and apical ischemia, TID  - LHC (4/08):  LAD occluded, RI occluded, CFX occluded, left main 40%, septal perforator 50%, LIMA-LAD patent, SVG-RI patent, SVG-RCA and distal LAD occluded, mid RCA stent patent, EF 30-40% >>> Med Rx  - Echo (8/13):  EF 45-50%, mild LVH, severe LAE, MAC, mild MR, aortic sclerosis, mild TR, mild RAE  - Carotid US (3/15):  Bilateral ICA 60-79%, mild bilateral subclavian artery stenosis >>> FU 6 mos (9/15)  Myoview 06/17/14: Impression  Exercise Capacity: Lexiscan with no exercise.  BP Response: Normal blood pressure response.  Clinical Symptoms: Typical chest pain.  ECG Impression: No significant ST segment change suggestive of ischemia.  Comparison with Prior Nuclear Study: No images to  compare   Overall Impression: High risk stress nuclear study with a moderate fixed defect in the inferior wall most consistent with prior infarct and perinfarct ischemia in the inferior wall and apex.  LV Ejection Fraction: Study not gated. LV Wall Motion: Study not gated therefore recommend 2D echo to assess for wall motion abnormalities in the inferior wall   Recent Labs/Images: 07/16/2013: TSH 1.540  03/11/2014: ALT 21; HDL Cholesterol by NMR 36.80*; LDL (calc) 104*  06/04/2014: Hemoglobin 13.0; Pro B Natriuretic peptide (BNP) 147.0*  06/14/2014: Creatinine 1.3; Potassium 5.1     Wt Readings from Last 3 Encounters:  06/25/14 222 lb (100.699 kg)  06/14/14 217 lb (98.431 kg)  06/04/14 221 lb (100.245 kg)     Past Medical History  Diagnosis Date  . Unspecified essential hypertension   . Anemia   . Asthma   . Hepatomegaly   . Esophageal reflux   . Other and unspecified coagulation defects   . Hiatal hernia   . Personal history of colonic polyps 05/29/2010    TUBULAR ADENOMA  . CAD (coronary artery disease)   . Hypothyroidism   . Gout   . Hyperlipidemia   . Gastric antral vascular ectasia 2013  . Atrial fibrillation     Current Outpatient Prescriptions  Medication Sig Dispense Refill  . acetaminophen (TYLENOL) 500 MG tablet Take 1,000 mg by mouth every 6 (six) hours as needed. For pain.       Marland Kitchen albuterol (PROVENTIL HFA;VENTOLIN HFA)  108 (90 BASE) MCG/ACT inhaler Inhale 2 puffs into the lungs every 6 (six) hours as needed. For shortness of breath.       . budesonide (PULMICORT) 0.25 MG/2ML nebulizer solution Take 0.25 mg by nebulization 2 (two) times daily.       . cetirizine (ZYRTEC) 10 MG tablet Take 1 tablet (10 mg total) by mouth daily.  30 tablet  11  . Cholecalciferol (VITAMIN D3) 5000 UNITS CAPS Take 1 capsule by mouth daily.        . Fe Fum-FePoly-FA-Vit C-Vit B3 (INTEGRA F) 125-1 MG CAPS TAKE ONE TABLET BY MOUTH TWICE A DAY  60 capsule  4  . furosemide (LASIX) 40 MG  tablet Take 1 tablet (40 mg total) by mouth daily.      Marland Kitchen ipratropium-albuterol (DUONEB) 0.5-2.5 (3) MG/3ML SOLN Take 3 mLs by nebulization every 6 (six) hours as needed. For shortness of breath.       . isosorbide-hydrALAZINE (BIDIL) 20-37.5 MG per tablet Take 1 tablet by mouth 2 (two) times daily.  60 tablet  10  . levothyroxine (SYNTHROID, LEVOTHROID) 100 MCG tablet Take 1 tablet (100 mcg total) by mouth daily.  90 tablet  1  . lovastatin (MEVACOR) 40 MG tablet Take 1 tablet (40 mg total) by mouth at bedtime.  90 tablet  2  . Multiple Vitamins-Minerals (MULTIVITAMINS THER. W/MINERALS) TABS Take 1 tablet by mouth daily.        . nitroGLYCERIN (NITROSTAT) 0.4 MG SL tablet Place 0.4 mg under the tongue every 5 (five) minutes as needed for chest pain.      Marland Kitchen omeprazole (PRILOSEC) 40 MG capsule TAKE ONE CAPSULE BY MOUTH EVERY MORNING  90 capsule  1  . potassium chloride (K-DUR) 10 MEQ tablet Take 1 tablet (10 mEq total) by mouth daily.  30 tablet  10  . valsartan (DIOVAN) 160 MG tablet Take 1 tablet (160 mg total) by mouth daily.  30 tablet  5  . warfarin (COUMADIN) 5 MG tablet TAKE 1 TABLET BY MOUTH EVERY DAY OR AS DIRECTED BY ANTICOAGULATION CLINIC  30 tablet  2   No current facility-administered medications for this visit.     Allergies:   Review of patient's allergies indicates no known allergies.   Social History:  The patient  reports that he quit smoking about 17 years ago. His smoking use included Cigarettes. He smoked 0.00 packs per day. He has never used smokeless tobacco. He reports that he drinks alcohol. He reports that he does not use illicit drugs.   Family History:  The patient's family history includes Cancer in his father; Early death in his brother; Heart attack in his brother; Heart disease in his father; Hypertension in an other family member; Kidney disease in his mother; Leukemia in his mother; Stomach cancer in his sister and sister. There is no history of Colon cancer.    ROS:  Please see the history of present illness.   No bleeding.   All other systems reviewed and negative.   PHYSICAL EXAM: VS:  BP 152/60  Pulse 83  Ht 5\' 10"  (1.778 m)  Wt 222 lb (100.699 kg)  BMI 31.85 kg/m2 Well nourished, well developed, in no acute distress HEENT: normal Neck: no JVD Cardiac:  normal S1, S2; RRR; no murmurno S3 Lungs:  Expiratory wheezes bilaterally, no   rales Abd: soft, nontender, no hepatomegaly Ext: trace bilat ankle edema Skin: warm and dry Neuro:  CNs 2-12 intact, no focal abnormalities noted  EKG:  AFib, HR 83, RBBB     ASSESSMENT AND PLAN:  1. Dyspnea:  Patient continues to have significant symptoms of dyspnea. He has an abnormal Myoview. He also has chronic systolic heart failure as well as COPD. Dyspnea may be multifactorial. However, after discussion with Dr. Tamala Julian, we felt it best to proceed with cardiac catheterization to further assess his symptoms. He will need a right and left heart catheterization. Dr. Tamala Julian will try to approach this from the left arm. Risks and benefits of cardiac catheterization have been discussed with the patient.  These include bleeding, infection, kidney damage, stroke, heart attack, death.  The patient understands these risks and is willing to proceed.  2. CAD s/p CABG: He is not on aspirin as he is on Coumadin. Continue statin. Arrange cath as noted. 3. CHRONIC ATRIAL FIBRILLATION: HR improved.  If symptoms of dizziness continue, he may need another Holter to further assess.  He has a questionable hx of TIA while off of Coumadin.  Lovenox bridge will be arranged.   4. CHRONIC SYSTOLIC CHF:  Volume stable. Continue current Rx. R/L heart cath pending. 5. HYPERTENSION: BP elevated.  Increase BiDil to TID.   6. HYPERLIPIDEMIA: Continue statin. 7. CAROTID STENOSIS:  FU carotid US scheduled this month.    Disposition:  FU with Dr. Daneen Schick after cath.    Signed, Versie Starks, MHS 06/25/2014 10:39 AM    [facility] [address], [place], [place]  [address] Phone: [phone]; Fax: [phone]

## 2014-06-25 NOTE — Telephone Encounter (Signed)
Patient is going to be scheduled for a cardiac cath and will need Lovenox bridging. His anticoagulation is managed through our office.  Can you manage this?

## 2014-06-26 MED ORDER — ENOXAPARIN SODIUM 100 MG/ML ~~LOC~~ SOLN: 100.0000 mg | Freq: Two times a day (BID) | SUBCUTANEOUS | Status: DC

## 2014-06-26 NOTE — Telephone Encounter (Signed)
Cath scheduled for 07/09/2014 per chart.  Recommend stop warfarin 5 days prior and bridge with enoxarin / Lovenox - see below.  Warfarin-Lovenox Bridging Plan for [name] [name] (DOB: 12-Aug-1941) Diagnosis:  atrial fibrillation Weight:  100kg  Wednesday, September 30th Last Dose of warfarin   Thursday, October 1st No warfarin    Friday, October 2nd No warfarin Lovenox/enoxaparin 100mg  in am & 100mg  in the pm  Saturday, October 3rd No warfarin Lovenox/enoxaparin 100mg  in am  & 100mg  in pm   Sunday, October 4th No warfarin Lovenox/enoxaparin 100mg  in am & 100mg  in pm  Monday, October 5th No warfarin Lovenox/enoxaparin 100mg  in am    Tuesday, October 6th Day of Cath./ procedure No warfarin No Lovenox/enoxaparin    Warfarin and lovenox / enoxaparin to be restarted in hospital per cardiologist.   Once discharged will need follow up INR / Protime within 1 week.

## 2014-06-26 NOTE — Telephone Encounter (Signed)
Thank you for taking care of this for [place]. [name], PA-C   06/26/2014 4:29 PM

## 2014-06-27 ENCOUNTER — Telehealth: Payer: Self-pay | Admitting: Pharmacist

## 2014-06-27 NOTE — Telephone Encounter (Signed)
Spoke to patient via phone.  Reviewed bridging plan and he knows to pick up copy at our office.  Rx already sent to pharmacy.

## 2014-06-27 NOTE — H&P (Signed)
The patient was examined. The history was reviewed. A myocardial perfusion study was reviewed. The procedure and risks as well as the rationale for coronary angiography were discussed with the patient. He understands the risks of stroke, death, myocardial infarction, allergy, limb ischemia, among other complications and is willing to proceed.

## 2014-06-28 NOTE — Telephone Encounter (Signed)
Patient called - he was concerned about cost of lovenox/enoxoparin.  Unfortunately no cheaper alternative.  Explained reason for bridging and how to give.  [name] might stop by next week for demonstration.

## 2014-07-03 ENCOUNTER — Ambulatory Visit (HOSPITAL_COMMUNITY): Payer: Medicare Other | Attending: Cardiovascular Disease | Admitting: Cardiology

## 2014-07-03 ENCOUNTER — Encounter (HOSPITAL_COMMUNITY): Payer: Self-pay | Admitting: Pharmacy Technician

## 2014-07-03 DIAGNOSIS — I6529 Occlusion and stenosis of unspecified carotid artery: Secondary | ICD-10-CM | POA: Diagnosis present

## 2014-07-03 DIAGNOSIS — J4489 Other specified chronic obstructive pulmonary disease: Secondary | ICD-10-CM | POA: Insufficient documentation

## 2014-07-03 DIAGNOSIS — I10 Essential (primary) hypertension: Secondary | ICD-10-CM | POA: Insufficient documentation

## 2014-07-03 DIAGNOSIS — Z87891 Personal history of nicotine dependence: Secondary | ICD-10-CM | POA: Insufficient documentation

## 2014-07-03 DIAGNOSIS — I6523 Occlusion and stenosis of bilateral carotid arteries: Secondary | ICD-10-CM

## 2014-07-03 DIAGNOSIS — I2510 Atherosclerotic heart disease of native coronary artery without angina pectoris: Secondary | ICD-10-CM | POA: Diagnosis not present

## 2014-07-03 DIAGNOSIS — J449 Chronic obstructive pulmonary disease, unspecified: Secondary | ICD-10-CM | POA: Insufficient documentation

## 2014-07-03 NOTE — Progress Notes (Signed)
Carotid duplex performed 

## 2014-07-05 ENCOUNTER — Other Ambulatory Visit (INDEPENDENT_AMBULATORY_CARE_PROVIDER_SITE_OTHER): Payer: Medicare Other | Admitting: *Deleted

## 2014-07-05 DIAGNOSIS — Z79899 Other long term (current) drug therapy: Secondary | ICD-10-CM

## 2014-07-05 DIAGNOSIS — E785 Hyperlipidemia, unspecified: Secondary | ICD-10-CM

## 2014-07-05 LAB — LIPID PANEL
CHOLESTEROL: 138 mg/dL (ref 0–200)
HDL: 37.6 mg/dL — ABNORMAL LOW (ref >39.00)
LDL Cholesterol: 71 mg/dL (ref 0–99)
NonHDL: 100.4
TRIGLYCERIDES: 147 mg/dL (ref 0.0–149.0)
Total CHOL/HDL Ratio: 4
VLDL: 29.4 mg/dL (ref 0.0–40.0)

## 2014-07-05 LAB — ALT: ALT: 20 U/L (ref 0–53)

## 2014-07-08 ENCOUNTER — Telehealth: Payer: Self-pay

## 2014-07-08 DIAGNOSIS — I6523 Occlusion and stenosis of bilateral carotid arteries: Secondary | ICD-10-CM

## 2014-07-08 NOTE — Telephone Encounter (Signed)
Message copied by Lamar Laundry on Mon Jul 08, 2014  9:43 AM ------      Message from: Daneen Schick      Created: Thu Jul 04, 2014  3:21 PM       Moderate bilateral carotid obstruction less than 79%. Repeat in 6 months. ------

## 2014-07-08 NOTE — Telephone Encounter (Signed)
Pt given carotid results.  Moderate bilateral carotid obstruction less than 79%. Repeat in 6 months. Pt verbalized understanding.

## 2014-07-09 ENCOUNTER — Encounter (HOSPITAL_COMMUNITY): Payer: Self-pay | Admitting: General Practice

## 2014-07-09 ENCOUNTER — Ambulatory Visit (HOSPITAL_COMMUNITY)
Admission: RE | Admit: 2014-07-09 | Discharge: 2014-07-10 | Disposition: A | Discharge status: Home / Self Care | Payer: Medicare Other | Source: Ambulatory Visit | Attending: Interventional Cardiology | Admitting: Interventional Cardiology

## 2014-07-09 ENCOUNTER — Encounter (HOSPITAL_COMMUNITY)
Admission: RE | Disposition: A | Discharge status: Home / Self Care | Payer: Medicare Other | Source: Ambulatory Visit | Attending: Interventional Cardiology

## 2014-07-09 ENCOUNTER — Telehealth: Payer: Self-pay

## 2014-07-09 DIAGNOSIS — I252 Old myocardial infarction: Secondary | ICD-10-CM | POA: Diagnosis not present

## 2014-07-09 DIAGNOSIS — E785 Hyperlipidemia, unspecified: Secondary | ICD-10-CM | POA: Diagnosis not present

## 2014-07-09 DIAGNOSIS — I2582 Chronic total occlusion of coronary artery: Secondary | ICD-10-CM | POA: Insufficient documentation

## 2014-07-09 DIAGNOSIS — Z79899 Other long term (current) drug therapy: Secondary | ICD-10-CM | POA: Diagnosis not present

## 2014-07-09 DIAGNOSIS — Z7951 Long term (current) use of inhaled steroids: Secondary | ICD-10-CM | POA: Insufficient documentation

## 2014-07-09 DIAGNOSIS — I25719 Atherosclerosis of autologous vein coronary artery bypass graft(s) with unspecified angina pectoris: Secondary | ICD-10-CM | POA: Diagnosis not present

## 2014-07-09 DIAGNOSIS — Z7901 Long term (current) use of anticoagulants: Secondary | ICD-10-CM | POA: Diagnosis not present

## 2014-07-09 DIAGNOSIS — Z23 Encounter for immunization: Secondary | ICD-10-CM | POA: Insufficient documentation

## 2014-07-09 DIAGNOSIS — I420 Dilated cardiomyopathy: Secondary | ICD-10-CM | POA: Diagnosis not present

## 2014-07-09 DIAGNOSIS — I10 Essential (primary) hypertension: Secondary | ICD-10-CM | POA: Diagnosis not present

## 2014-07-09 DIAGNOSIS — I25708 Atherosclerosis of coronary artery bypass graft(s), unspecified, with other forms of angina pectoris: Secondary | ICD-10-CM | POA: Diagnosis present

## 2014-07-09 DIAGNOSIS — I5022 Chronic systolic (congestive) heart failure: Secondary | ICD-10-CM | POA: Diagnosis not present

## 2014-07-09 DIAGNOSIS — J449 Chronic obstructive pulmonary disease, unspecified: Secondary | ICD-10-CM | POA: Diagnosis not present

## 2014-07-09 DIAGNOSIS — I482 Chronic atrial fibrillation: Secondary | ICD-10-CM | POA: Insufficient documentation

## 2014-07-09 DIAGNOSIS — I25728 Atherosclerosis of autologous artery coronary artery bypass graft(s) with other forms of angina pectoris: Secondary | ICD-10-CM

## 2014-07-09 DIAGNOSIS — Z951 Presence of aortocoronary bypass graft: Secondary | ICD-10-CM | POA: Insufficient documentation

## 2014-07-09 DIAGNOSIS — E039 Hypothyroidism, unspecified: Secondary | ICD-10-CM | POA: Diagnosis not present

## 2014-07-09 DIAGNOSIS — K219 Gastro-esophageal reflux disease without esophagitis: Secondary | ICD-10-CM | POA: Diagnosis not present

## 2014-07-09 DIAGNOSIS — R9439 Abnormal result of other cardiovascular function study: Secondary | ICD-10-CM

## 2014-07-09 DIAGNOSIS — I2510 Atherosclerotic heart disease of native coronary artery without angina pectoris: Secondary | ICD-10-CM

## 2014-07-09 DIAGNOSIS — I25119 Atherosclerotic heart disease of native coronary artery with unspecified angina pectoris: Secondary | ICD-10-CM | POA: Diagnosis not present

## 2014-07-09 DIAGNOSIS — R0602 Shortness of breath: Secondary | ICD-10-CM

## 2014-07-09 DIAGNOSIS — R0600 Dyspnea, unspecified: Secondary | ICD-10-CM | POA: Diagnosis present

## 2014-07-09 DIAGNOSIS — I25718 Atherosclerosis of autologous vein coronary artery bypass graft(s) with other forms of angina pectoris: Secondary | ICD-10-CM

## 2014-07-09 HISTORY — DX: Unspecified osteoarthritis, unspecified site: M19.90

## 2014-07-09 HISTORY — PX: LEFT AND RIGHT HEART CATHETERIZATION WITH CORONARY/GRAFT ANGIOGRAM: SHX5448

## 2014-07-09 HISTORY — DX: Personal history of other medical treatment: Z92.89

## 2014-07-09 LAB — BASIC METABOLIC PANEL
Anion gap: 14 (ref 5–15)
BUN: 21 mg/dL (ref 6–23)
CHLORIDE: 103 meq/L (ref 96–112)
CO2: 26 meq/L (ref 19–32)
CREATININE: 1.12 mg/dL (ref 0.50–1.35)
Calcium: 9 mg/dL (ref 8.4–10.5)
GFR calc Af Amer: 72 mL/min — ABNORMAL LOW (ref >90)
GFR calc non Af Amer: 62 mL/min — ABNORMAL LOW (ref >90)
Glucose, Bld: 103 mg/dL — ABNORMAL HIGH (ref 70–99)
Potassium: 4 mEq/L (ref 3.7–5.3)
Sodium: 143 mEq/L (ref 137–147)

## 2014-07-09 LAB — POCT I-STAT 3, VENOUS BLOOD GAS (G3P V)
ACID-BASE EXCESS: 2 mmol/L (ref 0.0–2.0)
Bicarbonate: 27.9 mEq/L — ABNORMAL HIGH (ref 20.0–24.0)
O2 Saturation: 70 %
PO2 VEN: 37 mmHg (ref 30.0–45.0)
TCO2: 29 mmol/L (ref 0–100)
pCO2, Ven: 46.9 mmHg (ref 45.0–50.0)
pH, Ven: 7.383 — ABNORMAL HIGH (ref 7.250–7.300)

## 2014-07-09 LAB — POCT I-STAT 3, ART BLOOD GAS (G3+)
ACID-BASE EXCESS: 1 mmol/L (ref 0.0–2.0)
Bicarbonate: 26.5 mEq/L — ABNORMAL HIGH (ref 20.0–24.0)
O2 SAT: 91 %
TCO2: 28 mmol/L (ref 0–100)
pCO2 arterial: 43.3 mmHg (ref 35.0–45.0)
pH, Arterial: 7.395 (ref 7.350–7.450)
pO2, Arterial: 61 mmHg — ABNORMAL LOW (ref 80.0–100.0)

## 2014-07-09 LAB — POCT ACTIVATED CLOTTING TIME: Activated Clotting Time: 427 seconds

## 2014-07-09 LAB — CBC
HEMATOCRIT: 37.4 % — AB (ref 39.0–52.0)
Hemoglobin: 12.5 g/dL — ABNORMAL LOW (ref 13.0–17.0)
MCH: 31.6 pg (ref 26.0–34.0)
MCHC: 33.4 g/dL (ref 30.0–36.0)
MCV: 94.4 fL (ref 78.0–100.0)
Platelets: 289 10*3/uL (ref 150–400)
RBC: 3.96 MIL/uL — ABNORMAL LOW (ref 4.22–5.81)
RDW: 13.5 % (ref 11.5–15.5)
WBC: 8.6 10*3/uL (ref 4.0–10.5)

## 2014-07-09 LAB — PROTIME-INR
INR: 1.13 (ref 0.00–1.49)
Prothrombin Time: 14.5 seconds (ref 11.6–15.2)

## 2014-07-09 SURGERY — LEFT AND RIGHT HEART CATHETERIZATION WITH CORONARY/GRAFT ANGIOGRAM
Anesthesia: LOCAL

## 2014-07-09 MED ORDER — FUROSEMIDE 10 MG/ML IJ SOLN
INTRAMUSCULAR | Status: AC
  Filled 2014-07-09: qty 4

## 2014-07-09 MED ORDER — MIDAZOLAM HCL 2 MG/2ML IJ SOLN
INTRAMUSCULAR | Status: AC
  Filled 2014-07-09: qty 2

## 2014-07-09 MED ORDER — ASPIRIN 81 MG PO CHEW
CHEWABLE_TABLET | ORAL | Status: AC
  Filled 2014-07-09: qty 1

## 2014-07-09 MED ORDER — ISOSORB DINITRATE-HYDRALAZINE 20-37.5 MG PO TABS
1.0000 | ORAL_TABLET | Freq: Three times a day (TID) | ORAL | Status: DC
  Administered 2014-07-09 – 2014-07-10 (×4): 1 via ORAL
  Filled 2014-07-09 (×7): qty 1

## 2014-07-09 MED ORDER — BIVALIRUDIN 250 MG IV SOLR
INTRAVENOUS | Status: AC
  Filled 2014-07-09: qty 250

## 2014-07-09 MED ORDER — WARFARIN - PHARMACIST DOSING INPATIENT: Freq: Every day | Status: DC

## 2014-07-09 MED ORDER — ONDANSETRON HCL 4 MG/2ML IJ SOLN: 4.0000 mg | Freq: Four times a day (QID) | INTRAMUSCULAR | Status: DC | PRN

## 2014-07-09 MED ORDER — ZOLPIDEM TARTRATE 5 MG PO TABS: 5.0000 mg | ORAL_TABLET | Freq: Every evening | ORAL | Status: DC | PRN

## 2014-07-09 MED ORDER — SODIUM CHLORIDE 0.9 % IV SOLN: 250.0000 mL | INTRAVENOUS | Status: DC | PRN

## 2014-07-09 MED ORDER — WARFARIN SODIUM 10 MG PO TABS: 10.0000 mg | ORAL_TABLET | Freq: Once | ORAL | Status: DC

## 2014-07-09 MED ORDER — HYDRALAZINE HCL 20 MG/ML IJ SOLN: 10.0000 mg | INTRAMUSCULAR | Status: DC | PRN

## 2014-07-09 MED ORDER — ONDANSETRON HCL 4 MG/2ML IJ SOLN
4.0000 mg | Freq: Four times a day (QID) | INTRAMUSCULAR | Status: DC | PRN
  Administered 2014-07-09: 4 mg via INTRAVENOUS
  Filled 2014-07-09: qty 2

## 2014-07-09 MED ORDER — NITROGLYCERIN 0.4 MG/SPRAY TL SOLN
Status: AC
  Filled 2014-07-09: qty 4.9

## 2014-07-09 MED ORDER — FERROUS SULFATE 325 (65 FE) MG PO TABS
325.0000 mg | ORAL_TABLET | Freq: Two times a day (BID) | ORAL | Status: DC
  Administered 2014-07-09 – 2014-07-10 (×2): 325 mg via ORAL
  Filled 2014-07-09 (×4): qty 1

## 2014-07-09 MED ORDER — ASPIRIN 81 MG PO CHEW
81.0000 mg | CHEWABLE_TABLET | ORAL | Status: AC
  Administered 2014-07-09: 81 mg via ORAL

## 2014-07-09 MED ORDER — SODIUM CHLORIDE 0.9 % IJ SOLN: 3.0000 mL | INTRAMUSCULAR | Status: DC | PRN

## 2014-07-09 MED ORDER — NITROGLYCERIN 1 MG/10 ML FOR IR/CATH LAB
INTRA_ARTERIAL | Status: AC
  Filled 2014-07-09: qty 10

## 2014-07-09 MED ORDER — IPRATROPIUM-ALBUTEROL 0.5-2.5 (3) MG/3ML IN SOLN: 3.0000 mL | Freq: Four times a day (QID) | RESPIRATORY_TRACT | Status: DC | PRN

## 2014-07-09 MED ORDER — SODIUM CHLORIDE 0.9 % IV SOLN
INTRAVENOUS | Status: DC
  Administered 2014-07-09: 06:00:00 via INTRAVENOUS

## 2014-07-09 MED ORDER — ALBUTEROL SULFATE (2.5 MG/3ML) 0.083% IN NEBU
3.0000 mL | INHALATION_SOLUTION | Freq: Four times a day (QID) | RESPIRATORY_TRACT | Status: DC | PRN
  Administered 2014-07-09: 3 mL via RESPIRATORY_TRACT
  Filled 2014-07-09: qty 3

## 2014-07-09 MED ORDER — PANTOPRAZOLE SODIUM 40 MG PO TBEC
40.0000 mg | DELAYED_RELEASE_TABLET | Freq: Every day | ORAL | Status: DC
  Administered 2014-07-10: 40 mg via ORAL
  Filled 2014-07-09: qty 1

## 2014-07-09 MED ORDER — WARFARIN SODIUM 5 MG PO TABS
5.0000 mg | ORAL_TABLET | Freq: Once | ORAL | Status: AC
  Administered 2014-07-09: 5 mg via ORAL
  Filled 2014-07-09: qty 1

## 2014-07-09 MED ORDER — FENTANYL CITRATE 0.05 MG/ML IJ SOLN
INTRAMUSCULAR | Status: AC
  Filled 2014-07-09: qty 2

## 2014-07-09 MED ORDER — FUROSEMIDE 40 MG PO TABS
40.0000 mg | ORAL_TABLET | Freq: Two times a day (BID) | ORAL | Status: DC
  Administered 2014-07-09 – 2014-07-10 (×2): 40 mg via ORAL
  Filled 2014-07-09 (×4): qty 1

## 2014-07-09 MED ORDER — HEPARIN SODIUM (PORCINE) 1000 UNIT/ML IJ SOLN
INTRAMUSCULAR | Status: AC
  Filled 2014-07-09: qty 1

## 2014-07-09 MED ORDER — CLOPIDOGREL BISULFATE 75 MG PO TABS
150.0000 mg | ORAL_TABLET | Freq: Once | ORAL | Status: AC
  Administered 2014-07-09: 18:00:00 150 mg via ORAL
  Filled 2014-07-09: qty 2

## 2014-07-09 MED ORDER — ALPRAZOLAM 0.25 MG PO TABS: 0.2500 mg | ORAL_TABLET | Freq: Two times a day (BID) | ORAL | Status: DC | PRN

## 2014-07-09 MED ORDER — INFLUENZA VAC SPLIT QUAD 0.5 ML IM SUSY
0.5000 mL | PREFILLED_SYRINGE | INTRAMUSCULAR | Status: AC
  Administered 2014-07-10: 0.5 mL via INTRAMUSCULAR
  Filled 2014-07-09: qty 0.5

## 2014-07-09 MED ORDER — ACETAMINOPHEN 325 MG PO TABS: 650.0000 mg | ORAL_TABLET | ORAL | Status: DC | PRN

## 2014-07-09 MED ORDER — BUDESONIDE 0.25 MG/2ML IN SUSP
0.2500 mg | Freq: Two times a day (BID) | RESPIRATORY_TRACT | Status: DC
  Administered 2014-07-09 – 2014-07-10 (×2): 0.25 mg via RESPIRATORY_TRACT
  Filled 2014-07-09 (×4): qty 2

## 2014-07-09 MED ORDER — SODIUM CHLORIDE 0.9 % IV SOLN: INTRAVENOUS | Status: AC

## 2014-07-09 MED ORDER — PRAVASTATIN SODIUM 40 MG PO TABS
40.0000 mg | ORAL_TABLET | Freq: Every day | ORAL | Status: DC
  Administered 2014-07-09: 40 mg via ORAL
  Filled 2014-07-09 (×2): qty 1

## 2014-07-09 MED ORDER — SODIUM CHLORIDE 0.9 % IJ SOLN: 3.0000 mL | Freq: Two times a day (BID) | INTRAMUSCULAR | Status: DC

## 2014-07-09 MED ORDER — IRBESARTAN 150 MG PO TABS
150.0000 mg | ORAL_TABLET | Freq: Every day | ORAL | Status: DC
  Administered 2014-07-09 – 2014-07-10 (×2): 150 mg via ORAL
  Filled 2014-07-09 (×3): qty 1

## 2014-07-09 MED ORDER — TICAGRELOR 90 MG PO TABS
ORAL_TABLET | ORAL | Status: AC
  Filled 2014-07-09: qty 2

## 2014-07-09 MED ORDER — LIDOCAINE HCL (PF) 1 % IJ SOLN
INTRAMUSCULAR | Status: AC
  Filled 2014-07-09: qty 30

## 2014-07-09 MED ORDER — VERAPAMIL HCL 2.5 MG/ML IV SOLN
INTRAVENOUS | Status: AC
  Filled 2014-07-09: qty 2

## 2014-07-09 MED ORDER — LEVOTHYROXINE SODIUM 100 MCG PO TABS
100.0000 ug | ORAL_TABLET | Freq: Every day | ORAL | Status: DC
  Administered 2014-07-10: 100 ug via ORAL
  Filled 2014-07-09 (×2): qty 1

## 2014-07-09 MED ORDER — ACETAMINOPHEN 500 MG PO TABS: 1000.0000 mg | ORAL_TABLET | Freq: Four times a day (QID) | ORAL | Status: DC | PRN

## 2014-07-09 MED ORDER — OXYCODONE-ACETAMINOPHEN 5-325 MG PO TABS
1.0000 | ORAL_TABLET | ORAL | Status: DC | PRN
  Administered 2014-07-10: 1 via ORAL
  Filled 2014-07-09: qty 1

## 2014-07-09 MED ORDER — HEPARIN (PORCINE) IN NACL 2-0.9 UNIT/ML-% IJ SOLN
INTRAMUSCULAR | Status: AC
  Filled 2014-07-09: qty 1000

## 2014-07-09 MED ORDER — CLOPIDOGREL BISULFATE 75 MG PO TABS
75.0000 mg | ORAL_TABLET | Freq: Every day | ORAL | Status: DC
  Administered 2014-07-10: 11:00:00 75 mg via ORAL
  Filled 2014-07-09: qty 1

## 2014-07-09 MED ORDER — NITROGLYCERIN 0.4 MG SL SUBL: 0.4000 mg | SUBLINGUAL_TABLET | SUBLINGUAL | Status: DC | PRN

## 2014-07-09 MED ORDER — ALBUTEROL SULFATE (2.5 MG/3ML) 0.083% IN NEBU: 3.0000 mL | INHALATION_SOLUTION | Freq: Four times a day (QID) | RESPIRATORY_TRACT | Status: DC | PRN

## 2014-07-09 MED ORDER — POTASSIUM CHLORIDE ER 10 MEQ PO TBCR
10.0000 meq | EXTENDED_RELEASE_TABLET | Freq: Every day | ORAL | Status: DC
  Administered 2014-07-10: 11:00:00 10 meq via ORAL
  Filled 2014-07-09: qty 1

## 2014-07-09 MED ORDER — ALUM & MAG HYDROXIDE-SIMETH 200-200-20 MG/5ML PO SUSP
15.0000 mL | Freq: Once | ORAL | Status: AC
  Administered 2014-07-09: 15 mL via ORAL
  Filled 2014-07-09: qty 30

## 2014-07-09 NOTE — CV Procedure (Signed)
Left and Right Heart Catheterization with Coronary Angiography and PCI Report  [name] [name]  73 y.o.  male 24-Oct-1940  Procedure Date: 07/09/2014 Referring Physician: Valli Glance Blenda Bridegroom, M.D. Primary Cardiologist: Same  INDICATIONS: Dyspnea suspected as an anginal equivalent and high risk myocardial perfusion study.  PROCEDURE: 1. Left heart catheterization; 2. Coronary angiography; 3. Right heart catheterization; 4. Bypass graft angiography; 5. Bare-metal stent SVG to RCA; 6. Left ventriculography  CONSENT:  The risks, benefits, and details of the procedure were explained in detail to the patient. Risks including death, stroke, heart attack, kidney injury, allergy, limb ischemia, bleeding and radiation injury were discussed.  The patient verbalized understanding and wanted to proceed.  Informed written consent was obtained.  PROCEDURE TECHNIQUE:  After Xylocaine anesthesia a 5 French cath slender sheath was placed in the left radial artery with an angiocath and the modified Seldinger technique.  A 6 French sheath was placed in the left antecubital vein in exchange for an 18-gauge Angiocath placed prior to the procedure (double glove protocol). White heart catheterization was performed first using a 5 French balloon tip catheter. Oximetry was obtained. Coronary angiography was done using a 5 F 5 [place] JR 4 and JL 4 diagnostic catheters.  Left ventriculography was done using the JR 4 catheter and hand injection.   Review of the digital images demonstrated 50-70% ostial/proximal LIMA obstruction. The vessel is otherwise widely patent to the mid LAD.  The saphenous vein graft to the distal LAD is totally occluded. The saphenous vein graft to the ramus intermedius is widely patent. The saphenous vein graft to the right coronary is highly diseased with a 95% stenosis focally within a 50% proximal segment. After considering options I discussed treatment strategies with the patient. The LAD  native vessel is no longer graftable. The LIMA obstruction is not critical. The abnormalities on myocardial perfusion R. inferior wall related. Therefore management of the saphenous vein graft obstruction with PCI seems most prudent.  Bivalirudin bolus and infusion was given to achieve a therapeutic ACT. Brilinta was loaded by using 180 mg orally. This will be a one time dose and he will be converted to Plavix later this evening. The conversion is because the patient is on chronic Coumadin therapy and has had previous bleeding requiring Coumadin sensation. Intervention with a bare-metal stent was chosen because of the prior bleeding complications that the patient has had. This will limit his exposure to triple drug therapy (aspirin/Plavix/Coumadin).  We used a pro-water guidewire and were able to advance it into the distal part of the right coronary saphenous vein graft. We used a 6 [place] A2 multipurpose guide catheter. We then placed a 4.0 Spider for distal protection. We then positioned and deployed a 28 x 4.0 mm bare-metal stent at nominal pressure. There was the appearance of mild intimal disruption of the distal stent margin. A 3.5 x 12 mm bare-metal stent was then placed and deployed at high pressure (14 atmospheres) after the platform balloon was retracted proximal to the distal margin. This left the distal margin with an obvious step-off and no evidence of disruption. TIMI grade 3 flow was noted. The filter basket was retrieved without difficulty.  Hemostasis was achieved with manual compression in the left antecubital and a wrist band in the left radial.   CONTRAST:  Total of 220 cc.  COMPLICATIONS:  None   HEMODYNAMICS:  Aortic pressure 158/64 mmHg; LV pressure 173/9 mmHg; LVEDP 14 mm mercury; RA 11 mmHg; RV  43/8 mmHg; PA 42/28 mmHg; PCWP(mean) 18 mmHg  ANGIOGRAPHIC DATA:   The left main coronary artery is patent with distal 30% narrowing.  The left anterior descending artery is totally  occluded proximally. 90% stenosis in the first septal perforator the  The left circumflex artery is totally occluded proximally..  The right coronary artery is totally occluded proximally.  BYPASS GRAFT ANGIOGRAPHY: Saphenous vein graft to the distal LAD is totally occluded. Saphenous vein graft to the ramus intermedius is widely patent. Saphenous vein graft to the PDA contains proximal segmental 50% narrowing with a focal region of 95% stenosis noted near the ostium. L. to LAD contains 50-70% ostial/proximal narrowing and is otherwise widely patent. The native LAD is totally occluded beyond the graft insertion site. There is retrograde filling to the proximal LAD via the graft.   PCI RESULTS: The segmental proximal SVG to PDA stenosis with a focal 95% narrowing was reduced to 0% after placement of a 4.0 overlapped with a distal margin 3.5 bare-metal stent (total stent length 40 mm). TIMI grade 3 flow was noted. 0% stenosis was present post stent.  LEFT VENTRICULOGRAM:  Left ventricular angiogram was done in the 30 RAO projection and revealed dilated cavity with decreased function felt to be less than 35%. Visualization was poor.   IMPRESSIONS:  1. Bypass graft failure with occlusion of the saphenous vein graft to the distal LAD, and high grade proximal 95% stenosis of the saphenous vein graft to the right coronary. 2. Severe native vessel coronary disease with total occlusion of the mid RCA proximal to the previously stented segment, total occlusion of the proximal LAD, and total occlusion of the proximal circumflex. The first septal perforator contains 90% stenosis. 3. Left ventricular systolic dysfunction with severe inferior wall motion abnormality and estimated ejection fraction of 35%. 4. Patent left internal mammary artery graft to the LAD. The ostial/proximal LIMA contains 50-70% stenosis. 5. Successful bare-metal stent of the proximal saphenous vein graft to the RCA with reduction and  95% stenosis to 0%.    RECOMMENDATION:  Brilinta and bivalirudin were used during today's procedure. We will give 150 mg of clopidogrel this evening and 75 mg daily thereafter. We will begin to reload Coumadin this evening with a 10 mg dose. Aspirin 81 mg daily Plan discharge in a.m. with early follow up in Coumadin clinic

## 2014-07-09 NOTE — Telephone Encounter (Signed)
called to give pt lab results.lmtcb

## 2014-07-09 NOTE — Care Management Note (Addendum)
  Page 1 of 1   07/09/2014     2:41:30 PM CARE MANAGEMENT NOTE 07/09/2014  Patient:  [name] [name], [name] [name]   Account Number:  [id number]  Date Initiated:  07/09/2014  Documentation initiated by:  Tacora Athanas  Subjective/Objective Assessment:   LEFT AND RIGHT HEART CATHETERIZATION WITH CORONARY/GRAFT ANGIOGRAM (     Action/Plan:   CM to follow for disposition needs   Anticipated DC Date:  07/10/2014   Anticipated DC Plan:  HOME/SELF CARE         Choice offered to / List presented to:             Status of service:  Completed, signed off Medicare Important Message given?   (If response is "NO", the following Medicare IM given date fields will be blank) Date Medicare IM given:   Medicare IM given by:   Date Additional Medicare IM given:   Additional Medicare IM given by:    Discharge Disposition:  HOME/SELF CARE  Per UR Regulation:    If discussed at Long Length of Stay Meetings, dates discussed:    Comments:  Kailly Richoux RN, BSN, MSHL, CCM  Nurse - Case Manager,  (Unit [phone]  07/09/2014 LEFT AND RIGHT HEART CATHETERIZATION WITH CORONARY/GRAFT ANGIOGRAM Med Review:  clopidogrel (PLAVIX) tablet 150 mg qd Dispo Plan:  Home / Self care

## 2014-07-09 NOTE — Progress Notes (Signed)
ANTICOAGULATION CONSULT NOTE - Initial Consult  Pharmacy Consult for coumadin Indication: atrial fibrillation  No Known Allergies  Patient Measurements: Height: 5' 10.5" (179.1 cm) Weight: 220 lb (99.791 kg) IBW/kg (Calculated) : 74.15 Heparin Dosing Weight:   Vital Signs: Temp: 97.8 F (36.6 C) (10/06 0537) Temp Source: Oral (10/06 0537) BP: 171/63 mmHg (10/06 1305) Pulse Rate: 72 (10/06 1305)  Labs:  Recent Labs  07/09/14 0559  HGB 12.5*  HCT 37.4*  PLT 289  LABPROT 14.5  INR 1.13  CREATININE 1.12    Estimated Creatinine Clearance: 68 ml/min (by C-G formula based on Cr of 1.12).   Medical History: Past Medical History  Diagnosis Date  . Unspecified essential hypertension   . Anemia   . Asthma   . Hepatomegaly   . Esophageal reflux   . Other and unspecified coagulation defects   . Hiatal hernia   . Personal history of colonic polyps 05/29/2010    TUBULAR ADENOMA  . CAD (coronary artery disease)   . Hypothyroidism   . Gout   . Hyperlipidemia   . Gastric antral vascular ectasia 2013  . Atrial fibrillation   . History of blood transfusion ~ 2012    "blood count dropped; had to get 3 units"  . Arthritis     "hips; back" (07/09/2014)  . On home oxygen therapy     "2L at night" (07/09/2014)    Medications:  Scheduled:  . aspirin      . budesonide  0.25 mg Nebulization BID  . clopidogrel  150 mg Oral Once  . [START ON 07/10/2014] clopidogrel  75 mg Oral Q breakfast  . ferrous sulfate  325 mg Oral BID WC  . furosemide  40 mg Oral BID  . [START ON 07/10/2014] Influenza vac split quadrivalent PF  0.5 mL Intramuscular Tomorrow-1000  . irbesartan  150 mg Oral Daily  . isosorbide-hydrALAZINE  1 tablet Oral TID  . [START ON 07/10/2014] levothyroxine  100 mcg Oral QAC breakfast  . [START ON 07/10/2014] pantoprazole  40 mg Oral Daily  . [START ON 07/10/2014] potassium chloride  10 mEq Oral Daily  . pravastatin  40 mg Oral q1800   Infusions:  . sodium chloride  50 mL/hr at 07/09/14 1147    Assessment: 73 yo male s/p cath will be resumed on coumadin.  INR today was 1.13 and last dose of coumadin was on 07/03/14 in anticipation for procedure.  Patient was on coumadin 5 mg po daily prior to admission.  Goal of Therapy:  INR 2-3 Monitor platelets by anticoagulation protocol: Yes   Plan:  - coumadin 5mg  po x1 - INR in am  [name] [name], Tsz-Yin 07/09/2014,3:30 PM

## 2014-07-09 NOTE — Progress Notes (Signed)
Site area: Left brachial venous access site Site Prior to Removal:  Level 0 Pressure Applied For: 15 minutes by Eddie Dibbles Manual:   yes Patient Status During Pull:  Stable Post Pull Site:  Level 0 Post Pull Instructions Given:  yes Post Pull Pulses Present: n/a Dressing Applied: Tegaderm and CoFlex Bedrest begins @ n/a Comments: No complications

## 2014-07-09 NOTE — Interval H&P Note (Signed)
Cath Lab Visit (complete for each Cath Lab visit)  Clinical Evaluation Leading to the Procedure:   ACS: No.  Non-ACS:    Anginal Classification: CCS III  Anti-ischemic medical therapy: Maximal Therapy (2 or more classes of medications)  Non-Invasive Test Results: High-risk stress test findings: cardiac mortality >3%/year  Prior CABG: Previous CABG      History and Physical Interval Note:  07/09/2014 7:48 AM  [name] [name]  has presented today for surgery, with the diagnosis of cad/copd/prior cabg/shortness of breath  The various methods of treatment have been discussed with the patient and family. After consideration of risks, benefits and other options for treatment, the patient has consented to  Procedure(s): LEFT AND RIGHT HEART CATHETERIZATION WITH CORONARY/GRAFT ANGIOGRAM (N/A) as a surgical intervention .  The patient's history has been reviewed, patient examined, no change in status, stable for surgery.  I have reviewed the patient's chart and labs.  Questions were answered to the patient's satisfaction.     Sinclair Grooms

## 2014-07-09 NOTE — Progress Notes (Signed)
C/O "breathlessness", O2 sat 95%. ??? Brilanta related???Dr. Tamala Julian aware.

## 2014-07-09 NOTE — Telephone Encounter (Signed)
Message copied by Lamar Laundry on Tue Jul 09, 2014 11:41 AM ------      Message from: Daneen Schick      Created: Fri Jul 05, 2014  7:16 PM       At target. Recheck 1 year ------

## 2014-07-09 NOTE — Progress Notes (Signed)
Report Called to [place], RN on [place].  No reports of pain from patient. Patient reports feeling "less short of breath than earlier". Left radial site level 1, Left brachial site level 0.  Patient transported to unit [place].

## 2014-07-09 NOTE — Progress Notes (Signed)
Patient c/o indigestion.  Maalox ordered and given x1.  RN will continue to monitor. Shellee Milo, RN

## 2014-07-09 NOTE — Progress Notes (Signed)
Report to relief: Ashley,RN

## 2014-07-09 NOTE — Interval H&P Note (Signed)
Cath Lab Visit (complete for each Cath Lab visit)  Clinical Evaluation Leading to the Procedure:   ACS: No.  Non-ACS:    Anginal Classification: CCS III  Anti-ischemic medical therapy: Maximal Therapy (2 or more classes of medications)  Non-Invasive Test Results: No non-invasive testing performed  Prior CABG: Previous CABG      History and Physical Interval Note:  07/09/2014 10:44 AM  [name] [name]  has presented today for surgery, with the diagnosis of cad/copd/prior cabg/shortness of breath  The various methods of treatment have been discussed with the patient and family. After consideration of risks, benefits and other options for treatment, the patient has consented to  Procedure(s): LEFT AND RIGHT HEART CATHETERIZATION WITH CORONARY/GRAFT ANGIOGRAM (N/A) as a surgical intervention .  The patient's history has been reviewed, patient examined, no change in status, stable for surgery.  I have reviewed the patient's chart and labs.  Questions were answered to the patient's satisfaction.     Sinclair Grooms

## 2014-07-09 NOTE — Progress Notes (Signed)
Patient c/o nausea.  MD on call notified.  Awaiting new orders. RN will continue to monitor. Shellee Milo, RN

## 2014-07-09 NOTE — Progress Notes (Signed)
TR BAND REMOVAL  LOCATION:    left radial  DEFLATED PER PROTOCOL:    Yes.    TIME BAND OFF / DRESSING APPLIED:    1530   SITE UPON ARRIVAL:    Level 0  SITE AFTER BAND REMOVAL:    Level 0  REVERSE ALLEN'S TEST:     positive  CIRCULATION SENSATION AND MOVEMENT:    Within Normal Limits   Yes.    COMMENTS:   Tolerated procedure well

## 2014-07-09 NOTE — Progress Notes (Signed)
RN and I clarified with Dr. Tamala Julian - He does want Plavix load of 150mg  in addition to Brinlinta load earlier this AM. He is aware of hx of GIB and ok to continue.   Sloan Leiter, PharmD, BCPS Clinical Pharmacist [phone] 07/09/2014, 6:01 PM

## 2014-07-10 ENCOUNTER — Telehealth: Payer: Self-pay | Admitting: Physician Assistant

## 2014-07-10 DIAGNOSIS — I2582 Chronic total occlusion of coronary artery: Secondary | ICD-10-CM | POA: Diagnosis not present

## 2014-07-10 DIAGNOSIS — Z23 Encounter for immunization: Secondary | ICD-10-CM | POA: Diagnosis not present

## 2014-07-10 DIAGNOSIS — I25119 Atherosclerotic heart disease of native coronary artery with unspecified angina pectoris: Secondary | ICD-10-CM | POA: Diagnosis not present

## 2014-07-10 DIAGNOSIS — I25719 Atherosclerosis of autologous vein coronary artery bypass graft(s) with unspecified angina pectoris: Secondary | ICD-10-CM | POA: Diagnosis not present

## 2014-07-10 LAB — CBC
HCT: 36 % — ABNORMAL LOW (ref 39.0–52.0)
HEMOGLOBIN: 12 g/dL — AB (ref 13.0–17.0)
MCH: 31.2 pg (ref 26.0–34.0)
MCHC: 33.3 g/dL (ref 30.0–36.0)
MCV: 93.5 fL (ref 78.0–100.0)
Platelets: 285 10*3/uL (ref 150–400)
RBC: 3.85 MIL/uL — ABNORMAL LOW (ref 4.22–5.81)
RDW: 13.7 % (ref 11.5–15.5)
WBC: 10.5 10*3/uL (ref 4.0–10.5)

## 2014-07-10 LAB — BASIC METABOLIC PANEL
Anion gap: 14 (ref 5–15)
BUN: 20 mg/dL (ref 6–23)
CALCIUM: 8.9 mg/dL (ref 8.4–10.5)
CO2: 26 mEq/L (ref 19–32)
CREATININE: 1.34 mg/dL (ref 0.50–1.35)
Chloride: 100 mEq/L (ref 96–112)
GFR calc non Af Amer: 50 mL/min — ABNORMAL LOW (ref >90)
GFR, EST AFRICAN AMERICAN: 58 mL/min — AB (ref >90)
Glucose, Bld: 104 mg/dL — ABNORMAL HIGH (ref 70–99)
Potassium: 3.9 mEq/L (ref 3.7–5.3)
Sodium: 140 mEq/L (ref 137–147)

## 2014-07-10 LAB — PROTIME-INR
INR: 1.16 (ref 0.00–1.49)
Prothrombin Time: 14.8 seconds (ref 11.6–15.2)

## 2014-07-10 LAB — MAGNESIUM: MAGNESIUM: 2.1 mg/dL (ref 1.5–2.5)

## 2014-07-10 MED ORDER — WARFARIN SODIUM 5 MG PO TABS
5.0000 mg | ORAL_TABLET | Freq: Once | ORAL | Status: DC
  Filled 2014-07-10: qty 1

## 2014-07-10 MED ORDER — ASPIRIN EC 81 MG PO TBEC: 81.0000 mg | DELAYED_RELEASE_TABLET | Freq: Every day | ORAL | Status: DC

## 2014-07-10 MED ORDER — CLOPIDOGREL BISULFATE 75 MG PO TABS: 75.0000 mg | ORAL_TABLET | Freq: Every day | ORAL | Status: DC

## 2014-07-10 MED FILL — Sodium Chloride IV Soln 0.9%: INTRAVENOUS | Qty: 50 | Status: AC

## 2014-07-10 NOTE — Telephone Encounter (Signed)
Enterred in error.

## 2014-07-10 NOTE — Progress Notes (Addendum)
CARDIAC REHAB PHASE I   PRE:  Rate/Rhythm: 76 afib  BP:  Supine: 176/62  Sitting:   Standing:    SaO2: 98%2L  MODE:  Ambulation: 500 ft   POST:  Rate/Rhythm: 90 afib PVCs  BP:  Supine:   Sitting: 212/87, 180/55 with rest  Standing:    SaO2: 94%RA 0900-0955 Pt walked 500 ft with steady gait. Stated he could tell that his breathing was better since stent. No CP but BP elevated. Dr Tamala Julian in to see pt and notified of BP. Gave pt CHF packet due to low EF and reviewed signs and symptoms of when to call MD. Pt encouraged pt weigh daily. He stated he keeps close watch on weight and salt intake. Reviewed importance of plavix, NTG use. Declined CRP 2 and he stated he attended before and goes to gym now. Encouraged to wait a week before returning due to wrist. Encouraged light walking.   Graylon Good, RN BSN  07/10/2014 9:49 AM

## 2014-07-10 NOTE — Progress Notes (Signed)
Home later today.

## 2014-07-10 NOTE — Progress Notes (Signed)
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 budesonide  0.25 mg Nebulization BID  . clopidogrel  75 mg Oral Q breakfast  . ferrous sulfate  325 mg Oral BID WC  . furosemide  40 mg Oral BID  . Influenza vac split quadrivalent PF  0.5 mL Intramuscular Tomorrow-1000  . irbesartan  150 mg Oral Daily  . isosorbide-hydrALAZINE  1 tablet Oral TID  . levothyroxine  100 mcg Oral QAC breakfast  . pantoprazole  40 mg Oral Daily  . potassium chloride  10 mEq Oral Daily  . pravastatin  40 mg Oral q1800  . Warfarin - Pharmacist Dosing Inpatient   Does not apply q1800    OBJECTIVE  Filed Vitals:   07/09/14 2149 07/10/14 0111 07/10/14 0300 07/10/14 0725  BP: 169/52  170/63 167/77  Pulse:   64 79  Temp:  98.2 F (36.8 C) 98.3 F (36.8 C) 97.9 F (36.6 C)  TempSrc:  Oral Oral Axillary  Resp:   18 18  Height:      Weight:      SpO2:   97% 96%    Intake/Output Summary (Last 24 hours) at 07/10/14 0727 Last data filed at 07/10/14 0701  Gross per 24 hour  Intake 740.83 ml  Output    850 ml  Net -109.17 ml   Filed Weights   07/09/14 0537  Weight: 220 lb (99.791 kg)    PHYSICAL EXAM  General: Pleasant, NAD. Neuro: Alert and oriented X 3. Moves all extremities spontaneously. Psych: Normal affect. HEENT:  Normal  Neck: Supple without bruits or JVD. Lungs:  Resp regular and unlabored, CTA. Heart: RRR no s3, s4, or murmurs. L radial cath site stable Abdomen: Soft, non-tender, non-distended, BS + x 4.  Extremities: No clubbing, cyanosis or edema. DP/PT/Radials 2+ and equal bilaterally.  Accessory Clinical Findings  CBC  Recent Labs  07/09/14 0559 07/10/14 0250  WBC 8.6 10.5  HGB 12.5* 12.0*  HCT 37.4* 36.0*  MCV 94.4 93.5  PLT 289 [id number]    Basic Metabolic Panel  Recent Labs  07/09/14 0559 07/10/14 0250  NA 143 140  K 4.0 3.9  CL 103 100  CO2 26 26  GLUCOSE 103* 104*  BUN 21 20  CREATININE 1.12 1.34  CALCIUM 9.0 8.9  MG  --  2.1    TELE A-fib with HR 70s, occasional pauses longest 2.8 sec, 6 beats run of NSVT    ECG  A-fib with HR 80, RBBB, Q wave in inferior lead   Radiology/Studies  No results found.  ASSESSMENT AND PLAN  1. Dyspnea with Abnormal myoview  - high risk nuc with moderate fixed defect in the inferior wall most consistent with prior infarct and periinfarct ischemia in inferior wall and apex  2. 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 Tamala Julian whether he can go without ASA. Likely discharge today after seen by cardiac rehab  3. Dilated cardiomyopathy 4. Chronic systolic HF 5. Chronic a-fib, on coumadin  - resume coumadin with lovanox bridge, occluded SVG to distal LAD, 50-70% prox LIMA to LAD   6. HTN: need better control, can potentially increase Bi-dil dose vs ARB 7. HLD 8. COPD 9. H/o prolonged pauses: off rate control med  Signed, Woodward Ku Pager: [phone]

## 2014-07-10 NOTE — Discharge Summary (Signed)
Plan 3 drug therapy x 1 moth then ASA and plavix for 3 months. Needs OV with me in 3-4 weeks.

## 2014-07-10 NOTE — Progress Notes (Signed)
Patient HTNsive prior to DC; Almyra Deforest, [place], aware of values.

## 2014-07-10 NOTE — Discharge Instructions (Signed)
Angina Pectoris Angina pectoris, often just called angina, is extreme discomfort in your chest, neck, or arm caused by a lack of blood in the middle and thickest layer of your heart wall (myocardium). It may feel like tightness or heavy pressure. It may feel like a crushing or squeezing pain. Some people say it feels like gas or indigestion. It may go down your shoulders, back, and arms. Some people may have symptoms other than pain. These symptoms include fatigue, shortness of breath, cold sweats, or nausea. There are four different types of angina:  Stable angina--Stable angina usually occurs in episodes of predictable frequency and duration. It usually is brought on by physical activity, emotional stress, or excitement. These are all times when the myocardium needs more oxygen. Stable angina usually lasts a few minutes and often is relieved by taking a medicine that can be taken under your tongue (sublingually). The medicine is called nitroglycerin. Stable angina is caused by a buildup of plaque inside the arteries, which restricts blood flow to the heart muscle (atherosclerosis).  Unstable angina--Unstable angina can occur even when your body experiences little or no physical exertion. It can occur during sleep. It can also occur at rest. It can suddenly increase in severity or frequency. It might not be relieved by sublingual nitroglycerin. It can last up to 30 minutes. The most common cause of unstable angina is a blood clot that has developed on the top of plaque buildup inside a coronary artery. It can lead to a heart attack if the blood clot completely blocks the artery.  Microvascular angina--This type of angina is caused by a disorder of tiny blood vessels called arterioles. Microvascular angina is more common in women. The pain may be more severe and last longer than other types of angina pectoris.  Prinzmetal or variant angina--This type of angina pectoris usually occurs when your body  experiences little or no physical exertion. It especially occurs in the early morning hours. It is caused by a spasm of your coronary artery. HOME CARE INSTRUCTIONS   Only take over-the-counter and prescription medicines as directed by your health care provider.  Stay active or increase your exercise as directed by your health care provider.  Limit strenuous activity as directed by your health care provider.  Limit heavy lifting as directed by your health care provider.  Maintain a healthy weight.  Learn about and eat heart-healthy foods.  Do not use any tobacco products including cigarettes, chewing tobacco or electronic cigarettes. SEEK IMMEDIATE MEDICAL CARE IF:  You experience the following symptoms:  Chest, neck, deep shoulder, or arm pain or discomfort that lasts more than a few minutes.  Chest, neck, deep shoulder, or arm pain or discomfort that goes away and comes back, repeatedly.  Heavy sweating with discomfort, without a noticeable cause.  Shortness of breath or difficulty breathing.  Angina that does not get better after a few minutes of rest or after taking sublingual nitroglycerin. These can all be symptoms of a heart attack, which is a medical emergency! Get medical help at once. Call your local emergency service (911 in [place] immediately. Do not  drive yourself to the hospital and do not  wait to for your symptoms to go away. MAKE SURE YOU:  Understand these instructions.  Will watch your condition.  Will get help right away if you are not doing well or get worse. Document Released: 09/20/2005 Document Revised: 09/25/2013 Document Reviewed: 01/22/2014 [facility] Patient Information 2015 [place], [place]. This information is not intended  to replace advice given to you by your health care provider. Make sure you discuss any questions you have with your health care provider.  No driving for 24 hours. No lifting over 5 lbs for 1 week. No sexual activity for 1 week.  Keep procedure site clean & dry. If you notice increased pain, swelling, bleeding or pus, call/return!  You may shower, but no soaking baths/hot tubs/pools for 1 week.

## 2014-07-10 NOTE — Progress Notes (Signed)
ANTICOAGULATION CONSULT NOTE - Follow Up Consult  Pharmacy Consult for coumadin Indication: atrial fibrillation  No Known Allergies  Patient Measurements: Height: 5' 10.5" (179.1 cm) Weight: 220 lb (99.791 kg) IBW/kg (Calculated) : 74.15 Heparin Dosing Weight:   Vital Signs: Temp: 97.9 F (36.6 C) (10/07 0725) Temp Source: Axillary (10/07 0725) BP: 167/77 mmHg (10/07 0725) Pulse Rate: 79 (10/07 0725)  Labs:  Recent Labs  07/09/14 0559 07/10/14 0250  HGB 12.5* 12.0*  HCT 37.4* 36.0*  PLT 289 285  LABPROT 14.5 14.8  INR 1.13 1.16  CREATININE 1.12 1.34    Estimated Creatinine Clearance: 56.9 ml/min (by C-G formula based on Cr of 1.34).   Medications:  Scheduled:  . budesonide  0.25 mg Nebulization BID  . clopidogrel  75 mg Oral Q breakfast  . ferrous sulfate  325 mg Oral BID WC  . furosemide  40 mg Oral BID  . Influenza vac split quadrivalent PF  0.5 mL Intramuscular Tomorrow-1000  . irbesartan  150 mg Oral Daily  . isosorbide-hydrALAZINE  1 tablet Oral TID  . levothyroxine  100 mcg Oral QAC breakfast  . pantoprazole  40 mg Oral Daily  . potassium chloride  10 mEq Oral Daily  . pravastatin  40 mg Oral q1800  . Warfarin - Pharmacist Dosing Inpatient   Does not apply q1800   Infusions:    Assessment: 73 yo male with afib s/p cath is currently on subtherapeutic coumadin.  INR today is 1.16.   Goal of Therapy:  INR 2-3 Monitor platelets by anticoagulation protocol: Yes   Plan:  - coumadin 5mg  po x1 - INR in am if still here tom  [name] [name], Tsz-Yin 07/10/2014,8:49 AM

## 2014-07-10 NOTE — Discharge Summary (Signed)
Discharge Summary   Patient ID: [name] [name],  MRN: [id number], DOB/AGE: 75-Feb-1942 75 y.o.  Admit date: 07/09/2014 Discharge date: 07/10/2014  Primary Care Provider: [name] Primary Cardiologist: Dr. 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  The left anterior descending artery is totally occluded proximally. 90% stenosis in the first septal perforator the  The left circumflex artery is totally occluded proximally..  The right coronary artery is totally occluded proximally.  BYPASS GRAFT ANGIOGRAPHY: Saphenous vein graft to the distal LAD is totally occluded.  Saphenous vein graft to the ramus intermedius is widely patent.  Saphenous vein graft to the PDA contains proximal segmental 50% narrowing with a focal region of 95% stenosis noted near the ostium.  L. to LAD contains 50-70% ostial/proximal narrowing and is otherwise widely patent. The native LAD is totally occluded beyond the graft insertion site. There is retrograde filling to the proximal LAD via the graft.  PCI RESULTS: The segmental proximal SVG to PDA stenosis with a focal 95% narrowing was reduced to 0% after placement of a 4.0 overlapped with a distal margin 3.5 bare-metal stent (total stent length 40 mm). TIMI grade 3 flow was noted. 0% stenosis was present post stent.  LEFT VENTRICULOGRAM: Left ventricular angiogram was done in the 30 RAO projection and revealed dilated cavity with decreased function felt to be less than 35%. Visualization was poor.  IMPRESSIONS: 1. Bypass  graft failure with occlusion of the saphenous vein graft to the distal LAD, and high grade proximal 95% stenosis of the saphenous vein graft to the right coronary.  2. Severe native vessel coronary disease with total occlusion of the mid RCA proximal to the previously stented segment, total occlusion of the proximal LAD, and total occlusion of the proximal circumflex. The first septal perforator contains 90% stenosis.  3. Left ventricular systolic dysfunction with severe inferior wall motion abnormality and estimated ejection fraction of 35%.  4. Patent left internal mammary artery graft to the LAD. The ostial/proximal LIMA contains 50-70% stenosis.  5. Successful bare-metal stent of the proximal saphenous vein graft to the RCA with reduction and 95% stenosis to 0%.  RECOMMENDATION: Brilinta and bivalirudin were used during today's procedure.  We will give 150 mg of clopidogrel this evening and 75 mg daily thereafter.  We will begin to reload Coumadin this evening with a 10 mg dose.  Aspirin 81 mg daily  Plan discharge in a.m. with early follow up in Coumadin clinic     Hospital Course  The patient is a 73 year old male with past medical history of inferior MI with subsequent CABG and PCI, dilated cardiomyopathy, chronic systolic heart failure, chronic atrial fibrillation, hypertension, hyperlipidemia and COPD. He has a history of 3.7 second pulse on Holter monitor, his diltiazem was discontinued. Patient was seen in early September for dyspnea. Myoview was obtained at the time which returned high risk with inferior infarct with peri-infarct ischemia.   Patient underwent cardiac catheterization on 07/09/2014 which noted 50-70% proximal LIMA to LAD stenosis, high-grade proximal 95% stenosis of SVG to RCA treated with 2 overlapping  bare-metal stent. Right heart cath showed LVEDP 14 mm mercury, pulmonary wedge pressure 18, RV pressure 43/8. After the procedure, patient was placed back on Coumadin along  with ASA and Plavix. Overnight, patient had several pulses on telemetry, the longest one was 2.8 seconds and he has been asymptomatic.   He was seen the morning of 07/10/2014, at which time he denies any significant chest discomfort or shortness breath. He is deemed stable for discharge from cardiology perspective. Since his Coumadin was taken off prior to the cardiac cath, he will need an INR check after restarting Coumadin. I will also schedule followup with Dr. Tamala Julian as well. While on triple therapy, he has higher chance of bleeding, will defer bridging with Lovenox during this mean time.   Of note, patient blood pressure 170-190s, he states his BP med was recently adjusted, he will need BP to be checked on followup and potentially increase medication. No AV nodal blocking agent with h/o pauses on telemetry and Holter. His PCP's office will check his INR next Monday.    Discharge Vitals Blood pressure 167/77, pulse 79, temperature 97.9 F (36.6 C), temperature source Axillary, resp. rate 18, height 5' 10.5" (1.791 m), weight 220 lb (99.791 kg), SpO2 96.00%.  Filed Weights   07/09/14 0537  Weight: 220 lb (99.791 kg)    Labs  CBC  Recent Labs  07/09/14 0559 07/10/14 0250  WBC 8.6 10.5  HGB 12.5* 12.0*  HCT 37.4* 36.0*  MCV 94.4 93.5  PLT 289 [id number]   Basic Metabolic Panel  Recent Labs  07/09/14 0559 07/10/14 0250  NA 143 140  K 4.0 3.9  CL 103 100  CO2 26 26  GLUCOSE 103* 104*  BUN 21 20  CREATININE 1.12 1.34  CALCIUM 9.0 8.9  MG  --  2.1    Disposition  Pt is being discharged home today in good condition.  Follow-up Plans & Appointments      Follow-up Information   Follow up with [name], PA-C On 07/30/2014. (10:10am)    Specialty:  Physician Assistant   Contact information:   [address] [address] [phone]       Please follow up. (Contact your primary care physician's office to check PT/INR/coumadin next monday)        Discharge Medications    Medication List    STOP taking these medications       enoxaparin 100 MG/ML injection  Commonly known as:  LOVENOX      TAKE these medications       acetaminophen 500 MG tablet  Commonly known as:  TYLENOL  Take 1,000 mg by mouth every 6 (six) hours as needed. For pain.     albuterol 108 (90 BASE) MCG/ACT inhaler  Commonly known as:  PROVENTIL HFA;VENTOLIN HFA  Inhale 2 puffs into the lungs every 6 (six) hours as needed. For shortness of breath.     aspirin EC 81 MG tablet  Take 1 tablet (81 mg total) by mouth daily.     budesonide 0.25 MG/2ML nebulizer solution  Commonly known as:  PULMICORT  Take 0.25 mg by nebulization 2 (two) times daily.     clopidogrel 75 MG tablet  Commonly known as:  PLAVIX  Take 1 tablet (75 mg total) by mouth daily with breakfast.     ferrous sulfate 325 (65 FE) MG tablet  Take 325 mg by mouth 2 (two) times daily with a meal.     furosemide 40  MG tablet  Commonly known as:  LASIX  Take 40 mg by mouth 2 (two) times daily.     ipratropium-albuterol 0.5-2.5 (3) MG/3ML Soln  Commonly known as:  DUONEB  Take 3 mLs by nebulization every 6 (six) hours as needed. For shortness of breath.     isosorbide-hydrALAZINE 20-37.5 MG per tablet  Commonly known as:  BIDIL  Take 1 tablet by mouth 3 (three) times daily.     levothyroxine 100 MCG tablet  Commonly known as:  SYNTHROID, LEVOTHROID  Take 1 tablet (100 mcg total) by mouth daily.     lovastatin 40 MG tablet  Commonly known as:  MEVACOR  Take 1 tablet (40 mg total) by mouth at bedtime.     multivitamins ther. w/minerals Tabs tablet  Take 1 tablet by mouth daily.     nitroGLYCERIN 0.4 MG SL tablet  Commonly known as:  NITROSTAT  Place 0.4 mg under the tongue every 5 (five) minutes as needed for chest pain.     omeprazole 40 MG capsule  Commonly known as:  PRILOSEC  Take 40 mg by mouth daily.     potassium chloride 10 MEQ tablet  Commonly known as:  K-DUR   Take 1 tablet (10 mEq total) by mouth daily.     valsartan 160 MG tablet  Commonly known as:  DIOVAN  Take 1 tablet (160 mg total) by mouth daily.     Vitamin D3 5000 UNITS Caps  Take 5,000 Units by mouth daily.     warfarin 5 MG tablet  Commonly known as:  COUMADIN  TAKE 1 TABLET BY MOUTH EVERY DAY OR AS DIRECTED BY ANTICOAGULATION CLINIC        Outstanding Labs/Studies  Check PT/INR next Monday at PCP's office  Duration of Discharge Encounter   Greater than 30 minutes including physician time.  Hilbert Corrigan PA-C Pager: [phone] 07/10/2014, 10:33 AM

## 2014-07-10 NOTE — Progress Notes (Signed)
Patient continues to be Afib with pauses he had one short run NSVT 6 beats at 0135. MD on call rounding on floor he was made aware. I will continue to monitor labs ordred.

## 2014-07-12 ENCOUNTER — Ambulatory Visit: Payer: Medicare Other | Admitting: Interventional Cardiology

## 2014-07-15 ENCOUNTER — Ambulatory Visit (INDEPENDENT_AMBULATORY_CARE_PROVIDER_SITE_OTHER): Payer: Medicare Other | Admitting: Pharmacist

## 2014-07-15 VITALS — Ht 70.5 in | Wt 218.0 lb

## 2014-07-15 DIAGNOSIS — I4891 Unspecified atrial fibrillation: Secondary | ICD-10-CM

## 2014-07-15 DIAGNOSIS — I482 Chronic atrial fibrillation, unspecified: Secondary | ICD-10-CM

## 2014-07-15 LAB — POCT INR: INR: 1.2

## 2014-07-15 NOTE — Patient Instructions (Signed)
Anticoagulation Dose Instructions as of 07/15/2014     Dorene Grebe Tue Wed Thu Fri Sat   New Dose 5 mg 5 mg 5 mg 5 mg 5 mg 5 mg 5 mg    Description       Goal INR 1.8-2.0 Take 1 tablet daily.  Check in 1 week.       INR was 1.2 today

## 2014-07-16 NOTE — Telephone Encounter (Signed)
returned pt call.unable to lmom. pt phone rings out. will attempt again later.

## 2014-07-16 NOTE — Telephone Encounter (Signed)
2nd attempt lmtcb

## 2014-07-16 NOTE — Telephone Encounter (Signed)
F/u   Pt returning call about labs and stated he is weak. Please call pt.

## 2014-07-17 ENCOUNTER — Telehealth: Payer: Self-pay | Admitting: Interventional Cardiology

## 2014-07-17 NOTE — Telephone Encounter (Signed)
returned pt call. pt wife adv that pt coumadin is followed by his pcp. he should follow there instruction on coumadin adjustments and appts.pt wife verabalized understanding.

## 2014-07-17 NOTE — Telephone Encounter (Signed)
°  Patient has question about blood work that Dr Laurance Flatten. Coumadin was 1.2. Please call and advise.

## 2014-07-22 ENCOUNTER — Ambulatory Visit (INDEPENDENT_AMBULATORY_CARE_PROVIDER_SITE_OTHER): Payer: Medicare Other | Admitting: Pharmacist

## 2014-07-22 DIAGNOSIS — I482 Chronic atrial fibrillation, unspecified: Secondary | ICD-10-CM

## 2014-07-22 DIAGNOSIS — I4891 Unspecified atrial fibrillation: Secondary | ICD-10-CM

## 2014-07-22 LAB — POCT INR: INR: 1.6

## 2014-07-22 NOTE — Patient Instructions (Signed)
Anticoagulation Dose Instructions as of 07/22/2014     [name] [name] Tue Wed Thu Fri Sat   New Dose 2.5 mg 5 mg 5 mg 5 mg 2.5 mg 5 mg 5 mg    Description       Goal INR 1.8-2.0 Change to 1/2 tablet on sundays and thursdays.  Take 1 tablet all other days.        INR was 1.6 today

## 2014-07-24 ENCOUNTER — Other Ambulatory Visit: Payer: Self-pay | Admitting: Family Medicine

## 2014-07-26 NOTE — Progress Notes (Signed)
Reviewed DC summary. Thanks for letting me know. [name], PA-C   07/26/2014 8:09 AM

## 2014-07-30 ENCOUNTER — Telehealth: Payer: Self-pay | Admitting: *Deleted

## 2014-07-30 ENCOUNTER — Encounter: Payer: Self-pay | Admitting: Physician Assistant

## 2014-07-30 ENCOUNTER — Ambulatory Visit (INDEPENDENT_AMBULATORY_CARE_PROVIDER_SITE_OTHER): Payer: Medicare Other | Admitting: Physician Assistant

## 2014-07-30 VITALS — BP 190/65 | HR 68 | Ht 70.5 in | Wt 221.0 lb

## 2014-07-30 DIAGNOSIS — I10 Essential (primary) hypertension: Secondary | ICD-10-CM

## 2014-07-30 DIAGNOSIS — E785 Hyperlipidemia, unspecified: Secondary | ICD-10-CM

## 2014-07-30 DIAGNOSIS — I482 Chronic atrial fibrillation, unspecified: Secondary | ICD-10-CM

## 2014-07-30 DIAGNOSIS — I2510 Atherosclerotic heart disease of native coronary artery without angina pectoris: Secondary | ICD-10-CM

## 2014-07-30 DIAGNOSIS — I6523 Occlusion and stenosis of bilateral carotid arteries: Secondary | ICD-10-CM

## 2014-07-30 DIAGNOSIS — I255 Ischemic cardiomyopathy: Secondary | ICD-10-CM

## 2014-07-30 DIAGNOSIS — I5022 Chronic systolic (congestive) heart failure: Secondary | ICD-10-CM

## 2014-07-30 LAB — PROTIME-INR
INR: 1.6 ratio — ABNORMAL HIGH (ref 0.8–1.0)
PROTHROMBIN TIME: 17.5 s — AB (ref 9.6–13.1)

## 2014-07-30 LAB — BASIC METABOLIC PANEL
BUN: 22 mg/dL (ref 6–23)
CHLORIDE: 101 meq/L (ref 96–112)
CO2: 25 mEq/L (ref 19–32)
Calcium: 9.6 mg/dL (ref 8.4–10.5)
Creatinine, Ser: 1.4 mg/dL (ref 0.4–1.5)
GFR: 54.75 mL/min — ABNORMAL LOW (ref >60.00)
GLUCOSE: 103 mg/dL — AB (ref 70–99)
Potassium: 4.3 mEq/L (ref 3.5–5.1)
Sodium: 138 mEq/L (ref 135–145)

## 2014-07-30 MED ORDER — LOSARTAN POTASSIUM 50 MG PO TABS: 50.0000 mg | ORAL_TABLET | Freq: Every day | ORAL | Status: DC

## 2014-07-30 NOTE — Progress Notes (Signed)
Cardiology Office Note   Date:  07/30/2014   ID:  [name] [name], DOB April 02, 1941, MRN [id number]  PCP:  [name], MD  Cardiologist:  Dr. Daneen Schick      History of Present Illness: [name] [name] is a 73 y.o. male with a hx of CAD s/p inferior MI in 1998 treated with PCI and subsequent CABG, s/p DES to the RCA in 2006, dilated cardiomyopathy, systolic CHF, chronic AFib, HTN, HL, COPD.     Patient was seen last month for dyspnea.  BNP was elevated and Lasix was adjusted briefly.  HR was slow and his CCB was reduced.  Holter demonstrated a 3.7 sec pause and Diltiazem was DC'd.  Myoview was obtained and this returned high risk with inferior infarct with peri-infarct ischemia.  We therefore arranged cardiac cath.  Cardiac catheterization demonstrated proximal LIMA-LAD 50-70% and high-grade proximal SVG-RCA 95%. SVG-RCA was treated with bare metal stent. He was discharged on Plavix, aspirin and Coumadin. He returns for follow-up.  He continues to be short of breath. However, this is improved. He is now NYHA 2-2B. He denies orthopnea, PND or edema. He denies chest pain. He denies syncope or near-syncope.   Studies:  - Nuclear (4/08):  Inferior scar with peri-infarct ischemia and apical ischemia, TID  - Nuclear (9/15):  High risk,  moderate fixed defect in the inferior wall most consistent with prior infarct and perinfarct ischemia in the inferior wall and apex.   - LHC (4/08):  LAD occluded, RI occluded, CFX occluded, left main 40%, septal perforator 50%, LIMA-LAD patent, SVG-RI patent, SVG-RCA and distal LAD occluded, mid RCA stent patent, EF 30-40% >>> Med Rx  - LHC (10/15):  Distal LM 30%, proximal LAD occluded, first septal perforator 90%, proximal CFX occluded, proximal RCA occluded; SVG-distal LAD occluded, SVG-RI patent, SVG-PDA proximal 50% with a focal region of 95% near the ostium, LIMA-LAD ostial/proximal 50-70%-otherwise patent (native LAD occluded beyond graft insertion), EF  <35%  > > PCI:  BMS x 2 to the SVG-RCA  - Echo (8/13):  EF 45-50%, mild LVH, severe LAE, MAC, mild MR, aortic sclerosis, mild TR, mild RAE  - Carotid US (3/15):  Bilateral ICA 60-79%, mild bilateral subclavian artery stenosis >>> FU 6 mos (9/15)   Recent Labs/Images: 06/04/2014: Pro B Natriuretic peptide (BNP) 147.0*  07/05/2014: ALT 20; HDL Cholesterol by NMR 37.60*; LDL (calc) 71  07/10/2014: Creatinine 1.34; Hemoglobin 12.0*; Potassium 3.9   Wt Readings from Last 3 Encounters:  07/30/14 221 lb (100.245 kg)  07/15/14 218 lb (98.884 kg)  07/09/14 220 lb (99.791 kg)     Past Medical History  Diagnosis Date  . Unspecified essential hypertension   . Anemia   . Asthma   . Hepatomegaly   . Esophageal reflux   . Other and unspecified coagulation defects   . Hiatal hernia   . Personal history of colonic polyps 05/29/2010    TUBULAR ADENOMA  . CAD (coronary artery disease)   . Hypothyroidism   . Gout   . Hyperlipidemia   . Gastric antral vascular ectasia 2013  . Atrial fibrillation   . History of blood transfusion ~ 2012    "blood count dropped; had to get 3 units"  . Arthritis     "hips; back" (07/09/2014)  . On home oxygen therapy     "2L at night" (07/09/2014)    Current Outpatient Prescriptions  Medication Sig Dispense Refill  . acetaminophen (TYLENOL) 500 MG tablet Take 1,000 mg by  mouth every 6 (six) hours as needed. For pain.       Marland Kitchen albuterol (PROVENTIL HFA;VENTOLIN HFA) 108 (90 BASE) MCG/ACT inhaler Inhale 2 puffs into the lungs every 6 (six) hours as needed. For shortness of breath.       Marland Kitchen aspirin EC 81 MG tablet Take 1 tablet (81 mg total) by mouth daily.      . budesonide (PULMICORT) 0.25 MG/2ML nebulizer solution Take 0.25 mg by nebulization 2 (two) times daily.       . Cholecalciferol (VITAMIN D3) 5000 UNITS CAPS Take 5,000 Units by mouth daily.       . clopidogrel (PLAVIX) 75 MG tablet Take 1 tablet (75 mg total) by mouth daily with breakfast.  30 tablet  1  .  ferrous sulfate 325 (65 FE) MG tablet Take 325 mg by mouth 2 (two) times daily with a meal.      . furosemide (LASIX) 40 MG tablet Take 40 mg by mouth 2 (two) times daily.      Marland Kitchen ipratropium-albuterol (DUONEB) 0.5-2.5 (3) MG/3ML SOLN Take 3 mLs by nebulization every 6 (six) hours as needed. For shortness of breath.       . isosorbide-hydrALAZINE (BIDIL) 20-37.5 MG per tablet Take 1 tablet by mouth 3 (three) times daily.  90 tablet  11  . levothyroxine (SYNTHROID, LEVOTHROID) 100 MCG tablet Take 1 tablet (100 mcg total) by mouth daily.  90 tablet  1  . lovastatin (MEVACOR) 40 MG tablet Take 1 tablet (40 mg total) by mouth at bedtime.  90 tablet  2  . Multiple Vitamins-Minerals (MULTIVITAMINS THER. W/MINERALS) TABS Take 1 tablet by mouth daily.        . nitroGLYCERIN (NITROSTAT) 0.4 MG SL tablet Place 0.4 mg under the tongue every 5 (five) minutes as needed for chest pain.      Marland Kitchen omeprazole (PRILOSEC) 40 MG capsule Take 40 mg by mouth daily.      . potassium chloride (K-DUR) 10 MEQ tablet Take 1 tablet (10 mEq total) by mouth daily.  30 tablet  10  . warfarin (COUMADIN) 5 MG tablet TAKE 1 TABLET BY MOUTH EVERY DAY OR AS DIRECTED BY ANTICOAGULATION CLINIC  30 tablet  2   No current facility-administered medications for this visit.     Allergies:   Review of patient's allergies indicates no known allergies.   Social History:  The patient  reports that he quit smoking about 17 years ago. His smoking use included Cigarettes. He has a 42 pack-year smoking history. He has never used smokeless tobacco. He reports that he drinks about .6 ounces of alcohol per week. He reports that he does not use illicit drugs.   Family History:  The patient's family history includes Cancer in his father; Early death in his brother; Heart attack in his brother; Heart disease in his father; Hypertension in an other family member; Kidney disease in his mother; Leukemia in his mother; Stomach cancer in his sister and sister.  There is no history of Colon cancer.   ROS:  Please see the history of present illness.  He has a chronic cough. He also complains of insomnia.  All other systems reviewed and negative.   PHYSICAL EXAM: VS:  BP 190/65  Pulse 68  Ht 5' 10.5" (1.791 m)  Wt 221 lb (100.245 kg)  BMI 31.25 kg/m2 Well nourished, well developed, in no acute distress HEENT: normal Neck: no JVD Cardiac:  normal S1, S2; RRR; no murmurno S3 Lungs:  Decreased breath sounds bilaterally, no   rales Abd: soft, nontender, no hepatomegaly Ext: No edemaLeft wrist without hematoma or mass  Skin: warm and dry Neuro:  CNs 2-12 intact, no focal abnormalities noted  EKG:   Atrial fibrillation, HR 68, RBBB, no change from prior tracing     ASSESSMENT AND PLAN:  1. CAD s/p CABG:  Patient is doing well after recent PCI to the SVG-RCA. He has chronic dyspnea that is probably multifactorial and related to CAD, CHF and COPD.  Dyspnea is overall improved since his recent PCI. Continue aspirin, Plavix, nitrates, statin. 2. CHRONIC ATRIAL FIBRILLATION:  Rate controlled. Continue warfarin which is followed by primary care. 3. CHRONIC SYSTOLIC CHF:  Volume appears stable. Continue current therapy. Check follow-up BMET today. 4. Ischemic Cardiomyopathy: He is not a candidate for beta blocker given previous history of bradycardia and pauses. Continue hydralazine, nitrates. I will resume ARB as noted below. He will be set up for a follow-up echocardiogram 90 days after his PCI. 5. HYPERTENSION:  Blood pressure uncontrolled. His medications changed. It is not clear to me what he is doing. He mentions a medication at home that is 10 mg. He does not feel that it is listed. We had him call back after he returned home. It turns out that he was talking about BiDil which he is already taking. He tells me he had significant problems with Diovan.            -  I will place him on losartan 50 mg daily.           -  Check BMET in 1 week.           -   He will call with a list of his blood pressures in about 2 weeks. 6. HYPERLIPIDEMIA:   Continue statin. 7. CAROTID STENOSIS:    FU carotid US scheduled this month.  Disposition:  FU with Dr. Tamala Julian in 1 month.    Signed, Versie Starks, MHS 07/30/2014 10:42 AM    [facility] [address], [place], [place]  [address] Phone: [phone]; Fax: [phone]

## 2014-07-30 NOTE — Telephone Encounter (Signed)
lmptcb to go over lab results with pt and to advise INR level was sent to Tammy at [facility] office.

## 2014-07-30 NOTE — Patient Instructions (Signed)
Your physician has requested that you have an echocardiogram DX RECENT PCI, CAD; THIS WILL NEED TO BE DONE 90 DAYS AFTER HIS STENT WAS PLACED ON 07/09/14. Echocardiography is a painless test that uses sound waves to create images of your heart. It provides your doctor with information about the size and shape of your heart and how well your heart's chambers and valves are working. This procedure takes approximately one hour. There are no restrictions for this procedure.  Your physician recommends that you return for lab work in: TODAY; BMET, PT/INR  LAB WORK IN 1 WEEK; BMET  START LOSARTAN 50 MG 1 TABLET DAILY; RX SENT IN   CALL WITH A LIST OF YOUR MEDICATIONS AFTER YOU GET HOME [phone] PRESS OPTION THAT SAYS IF WE CALLED YOU TODAY  MONITOR BP DAILY FOR THE NEXT 2 WEEKS AND  CALL WITH READINGS TO SCOTT WEAVER, PAC [phone]  OK TO GRADUALLY RESUME WEIGHTS AFTER 6-8 WEEKS AFTER YOUR STENT WAS PLACED ON 10/6/151; MAKE SURE NO MORE THAN 40 LB'S  YOU WILL NEED TO FOLLOW UP WITH DR. Tamala Julian IN 1 MONTH

## 2014-07-31 ENCOUNTER — Telehealth: Payer: Self-pay | Admitting: Pharmacist

## 2014-07-31 ENCOUNTER — Telehealth: Payer: Self-pay

## 2014-07-31 NOTE — Telephone Encounter (Signed)
Patient informed of lab results. 

## 2014-07-31 NOTE — Telephone Encounter (Signed)
Message copied by Cherre Robins on Wed Jul 31, 2014  4:53 PM ------      Message from: [place], [place] T      Created: Tue Jul 30, 2014  4:04 PM       INR was drawn here today per your request.      Thanks!      Scott            ----- Message -----         From: [facility] Interface         Sent: 07/30/2014   3:16 PM           To: Liliane Shi, PA-C                   ------

## 2014-07-31 NOTE — Telephone Encounter (Signed)
Follow up         Pt calling for lab results

## 2014-07-31 NOTE — Telephone Encounter (Signed)
INR was still 1.6.  Recommend take 1 and 1/2 tablet for 1 days then restart regimen below  Sunday 2.5mg  Monday 5mg  Tuesday 5mg  Wednesday 5mg  Thursday 2.5mg  Friday 5mg  Saturday 5mg 

## 2014-07-31 NOTE — Telephone Encounter (Signed)
Patient informed of labs. Told him to call Tammy if he doesn't get a call from her today.

## 2014-08-01 ENCOUNTER — Telehealth: Payer: Self-pay | Admitting: Family

## 2014-08-01 ENCOUNTER — Telehealth: Payer: Self-pay | Admitting: Physician Assistant

## 2014-08-01 NOTE — Telephone Encounter (Signed)
I reviewed with Dr. Daneen Schick. We want him to remain on Plavix for a total of 30 days after his recent stent procedure. He will then DC Plavix, but remain on ASA and Warfarin. He may stop Plavix after 08/09/14. I spoke with the patient today and reviewed this with him. He verbalized understanding and agreed to proceed with this plan. Signed,  [name], PA-C   08/01/2014 3:09 PM

## 2014-08-01 NOTE — Telephone Encounter (Signed)
Patient called with instructions for warfarin - INR was 1.6 when checked at cardiologist office.  Take 1 and 1/2 tablets of warfarin today then restart regular dose (INR goal is 1.8 to 2.2).  Recheck INR in 1-2 weeks.

## 2014-08-01 NOTE — Telephone Encounter (Signed)
Pt aware of INR results and his regimen listed below  on taking his med.  rs

## 2014-08-01 NOTE — Progress Notes (Signed)
Reviewed case with Dr. Daneen Schick. Patient has a hx of prior GI bleed.  Goal INR is 1.8-2.2. Recent PCI was with a BMS and was not done in setting of ACS. Therefore, we will plan to DC Plavix 30 days post PCI. He will then remain ASA and Warfarin only. Signed,  [name], PA-C   08/01/2014 3:07 PM

## 2014-08-06 ENCOUNTER — Other Ambulatory Visit (HOSPITAL_COMMUNITY): Payer: Medicare Other

## 2014-08-06 ENCOUNTER — Other Ambulatory Visit (INDEPENDENT_AMBULATORY_CARE_PROVIDER_SITE_OTHER): Payer: Medicare Other

## 2014-08-06 ENCOUNTER — Other Ambulatory Visit: Payer: Self-pay

## 2014-08-06 DIAGNOSIS — I5022 Chronic systolic (congestive) heart failure: Secondary | ICD-10-CM

## 2014-08-06 LAB — BASIC METABOLIC PANEL
BUN: 24 mg/dL — ABNORMAL HIGH (ref 6–23)
CO2: 24 meq/L (ref 19–32)
Calcium: 8.9 mg/dL (ref 8.4–10.5)
Chloride: 106 mEq/L (ref 96–112)
Creatinine, Ser: 1.2 mg/dL (ref 0.4–1.5)
GFR: 65.22 mL/min (ref >60.00)
Glucose, Bld: 72 mg/dL (ref 70–99)
Potassium: 4.1 mEq/L (ref 3.5–5.1)
Sodium: 141 mEq/L (ref 135–145)

## 2014-08-06 MED ORDER — LEVOTHYROXINE SODIUM 100 MCG PO TABS: 100.0000 ug | ORAL_TABLET | Freq: Every day | ORAL | Status: DC

## 2014-08-06 NOTE — Telephone Encounter (Signed)
Last seen 03/20/14  [name] [name]  Last thyroid level 07/16/13

## 2014-08-06 NOTE — Telephone Encounter (Signed)
no more refills without being seen  

## 2014-08-07 NOTE — Telephone Encounter (Signed)
lmom for [name] [name] at [facility] to verify pt will be having repeat INR in 1 week with PCP.

## 2014-08-07 NOTE — Telephone Encounter (Signed)
pt notified about lab results and is aware of the dose changes in his warfarin; see previous notes. I explained to pt that I just got back from vacation and was just following up on some pt's; Pt said thank you for the call.

## 2014-08-08 ENCOUNTER — Telehealth: Payer: Self-pay | Admitting: *Deleted

## 2014-08-08 NOTE — Telephone Encounter (Signed)
pt notified about lab results with verbal understanding  

## 2014-08-08 NOTE — Telephone Encounter (Signed)
lmptcb to go over lab results 

## 2014-08-08 NOTE — Telephone Encounter (Signed)
New Message  Pt called for lab results please call

## 2014-08-09 ENCOUNTER — Telehealth: Payer: Self-pay

## 2014-08-09 NOTE — Telephone Encounter (Signed)
F/u   Pt returning your call, he will only be at home for next 90mins.

## 2014-08-09 NOTE — Telephone Encounter (Signed)
called pt to sch a work in appt with [facility] for a 1 mo f/u per Margaret Pyle. lmtcb

## 2014-08-09 NOTE — Telephone Encounter (Signed)
-----   Message from Philbert Riser sent at 07/30/2014 12:03 PM EDT ----- Regarding: APPOINTMENT  07/29/14 [name], This patient needs an appointment in one month per [facility]

## 2014-08-09 NOTE — Telephone Encounter (Signed)
2nd attempt lmtcb

## 2014-08-12 NOTE — Telephone Encounter (Signed)
Pt aware 1 mo f/u.scheduled with Dr.Smith on 12/1 @1 :15pm. Pt verbalized understanding.

## 2014-08-12 NOTE — Telephone Encounter (Signed)
New  Message ° °Pt returned call  °

## 2014-08-14 ENCOUNTER — Ambulatory Visit (INDEPENDENT_AMBULATORY_CARE_PROVIDER_SITE_OTHER): Payer: Medicare Other | Admitting: Pharmacist

## 2014-08-14 DIAGNOSIS — I482 Chronic atrial fibrillation, unspecified: Secondary | ICD-10-CM

## 2014-08-14 DIAGNOSIS — I4891 Unspecified atrial fibrillation: Secondary | ICD-10-CM

## 2014-08-14 LAB — POCT INR: INR: 2

## 2014-08-14 NOTE — Patient Instructions (Signed)
Anticoagulation Dose Instructions as of 08/14/2014      [name] [name] Tue Wed Thu Fri Sat   New Dose 2.5 mg 5 mg 5 mg 5 mg 2.5 mg 5 mg 5 mg    Description        Goal INR 1.8-2.0 Continue warfarin 1/2 tablet on sundays and thursdays.  Take 1 tablet all other days.         INR was 2.0 today

## 2014-09-03 ENCOUNTER — Ambulatory Visit: Payer: Medicare Other | Admitting: Interventional Cardiology

## 2014-09-12 ENCOUNTER — Encounter (HOSPITAL_COMMUNITY): Payer: Self-pay | Admitting: Interventional Cardiology

## 2014-09-19 ENCOUNTER — Ambulatory Visit (INDEPENDENT_AMBULATORY_CARE_PROVIDER_SITE_OTHER): Payer: Medicare Other | Admitting: Pharmacist

## 2014-09-19 DIAGNOSIS — Z79899 Other long term (current) drug therapy: Secondary | ICD-10-CM

## 2014-09-19 DIAGNOSIS — I482 Chronic atrial fibrillation, unspecified: Secondary | ICD-10-CM

## 2014-09-19 DIAGNOSIS — D649 Anemia, unspecified: Secondary | ICD-10-CM

## 2014-09-19 DIAGNOSIS — I4891 Unspecified atrial fibrillation: Secondary | ICD-10-CM

## 2014-09-19 LAB — POCT CBC
GRANULOCYTE PERCENT: 76.2 % (ref 37–80)
HCT, POC: 30.4 % — AB (ref 43.5–53.7)
HEMOGLOBIN: 9.6 g/dL — AB (ref 14.1–18.1)
Lymph, poc: 1.4 (ref 0.6–3.4)
MCH: 30.7 pg (ref 27–31.2)
MCHC: 31.6 g/dL — AB (ref 31.8–35.4)
MCV: 97.2 fL — AB (ref 80–97)
MPV: 8.2 fL (ref 0–99.8)
PLATELET COUNT, POC: 294 10*3/uL (ref 142–424)
POC Granulocyte: 7.2 — AB (ref 2–6.9)
POC LYMPH PERCENT: 14.9 %L (ref 10–50)
RBC: 3.1 M/uL — AB (ref 4.69–6.13)
RDW, POC: 13.1 %
WBC: 9.5 10*3/uL (ref 4.6–10.2)

## 2014-09-19 LAB — POCT INR: INR: 1.7

## 2014-09-19 NOTE — Patient Instructions (Signed)
Anticoagulation Dose Instructions as of 09/19/2014      [name] [name] Tue Wed Thu Fri Sat   New Dose 2.5 mg 5 mg 5 mg 5 mg 2.5 mg 5 mg 5 mg    Description        Goal INR 1.8-2.0 Take extra 1/2 tablet today only then continue warfarin 1/2 tablet on sundays and thursdays.  Take 1 tablet all other days.        INR was 1.7 today

## 2014-09-19 NOTE — Progress Notes (Signed)
Checked hemoglobin today because had been on lower end of normal about 2 months ago.  Hemoglobin from POC test was 9.6.  We will send out for anemia panel. Patient also given hemocult test to return to office.  Advised to take ferrous sulfate 325mg  1 tid  RTC in 1 week  Cherre Robins, PharmD, CPP

## 2014-09-19 NOTE — Addendum Note (Signed)
Addended by: Earlene Plater on: 09/19/2014 04:57 PM   Modules accepted: Orders

## 2014-09-20 ENCOUNTER — Other Ambulatory Visit: Payer: Self-pay | Admitting: Family Medicine

## 2014-09-20 ENCOUNTER — Other Ambulatory Visit: Payer: Self-pay | Admitting: Pharmacist

## 2014-09-20 DIAGNOSIS — D649 Anemia, unspecified: Secondary | ICD-10-CM

## 2014-09-20 LAB — ANEMIA PROFILE B
BASOS ABS: 0.1 10*3/uL (ref 0.0–0.2)
Basos: 1 %
EOS ABS: 0.2 10*3/uL (ref 0.0–0.4)
Eos: 2 %
Ferritin: 28 ng/mL — ABNORMAL LOW (ref 30–400)
HCT: 31.9 % — ABNORMAL LOW (ref 37.5–51.0)
HEMOGLOBIN: 9.9 g/dL — AB (ref 12.6–17.7)
IRON SATURATION: 35 % (ref 15–55)
IRON: 141 ug/dL (ref 40–155)
Immature Grans (Abs): 0 10*3/uL (ref 0.0–0.1)
Immature Granulocytes: 0 %
LYMPHS: 11 %
Lymphocytes Absolute: 1 10*3/uL (ref 0.7–3.1)
MCH: 31.2 pg (ref 26.6–33.0)
MCHC: 31 g/dL — AB (ref 31.5–35.7)
MCV: 101 fL — ABNORMAL HIGH (ref 79–97)
Monocytes Absolute: 0.9 10*3/uL (ref 0.1–0.9)
Monocytes: 10 %
Neutrophils Absolute: 6.9 10*3/uL (ref 1.4–7.0)
Neutrophils Relative %: 76 %
Platelets: 355 10*3/uL (ref 150–379)
RBC: 3.17 x10E6/uL — ABNORMAL LOW (ref 4.14–5.80)
RDW: 13.1 % (ref 12.3–15.4)
Retic Ct Pct: 1.9 % (ref 0.6–2.6)
TIBC: 408 ug/dL (ref 250–450)
UIBC: 267 ug/dL (ref 150–375)
Vitamin B-12: 507 pg/mL (ref 211–946)
WBC: 9.1 10*3/uL (ref 3.4–10.8)

## 2014-09-23 ENCOUNTER — Other Ambulatory Visit: Payer: Medicare Other

## 2014-09-23 DIAGNOSIS — Z1212 Encounter for screening for malignant neoplasm of rectum: Secondary | ICD-10-CM

## 2014-09-23 NOTE — Progress Notes (Signed)
Lab only 

## 2014-09-24 ENCOUNTER — Other Ambulatory Visit (INDEPENDENT_AMBULATORY_CARE_PROVIDER_SITE_OTHER): Payer: Medicare Other

## 2014-09-24 DIAGNOSIS — D649 Anemia, unspecified: Secondary | ICD-10-CM

## 2014-09-24 NOTE — Progress Notes (Signed)
Lab only 

## 2014-09-25 ENCOUNTER — Telehealth: Payer: Self-pay | Admitting: *Deleted

## 2014-09-25 ENCOUNTER — Telehealth: Payer: Self-pay | Admitting: Pharmacist

## 2014-09-25 LAB — CBC WITH DIFFERENTIAL
BASOS: 1 %
Basophils Absolute: 0.1 10*3/uL (ref 0.0–0.2)
EOS ABS: 0.2 10*3/uL (ref 0.0–0.4)
Eos: 3 %
HEMATOCRIT: 30.7 % — AB (ref 37.5–51.0)
Hemoglobin: 10 g/dL — ABNORMAL LOW (ref 12.6–17.7)
IMMATURE GRANS (ABS): 0 10*3/uL (ref 0.0–0.1)
Immature Granulocytes: 0 %
LYMPHS ABS: 1.4 10*3/uL (ref 0.7–3.1)
Lymphs: 17 %
MCH: 31.5 pg (ref 26.6–33.0)
MCHC: 32.6 g/dL (ref 31.5–35.7)
MCV: 97 fL (ref 79–97)
Monocytes Absolute: 1.1 10*3/uL — ABNORMAL HIGH (ref 0.1–0.9)
Monocytes: 13 %
NEUTROS ABS: 5.6 10*3/uL (ref 1.4–7.0)
NEUTROS PCT: 66 %
Platelets: 375 10*3/uL (ref 150–379)
RBC: 3.17 x10E6/uL — AB (ref 4.14–5.80)
RDW: 13.4 % (ref 12.3–15.4)
WBC: 8.5 10*3/uL (ref 3.4–10.8)

## 2014-09-25 LAB — FECAL OCCULT BLOOD, IMMUNOCHEMICAL: Fecal Occult Bld: POSITIVE — AB

## 2014-09-25 NOTE — Telephone Encounter (Signed)
Lm to discuss

## 2014-09-25 NOTE — Telephone Encounter (Signed)
-----   Message from Chipper Herb, MD sent at 09/25/2014  2:21 PM EST ----- The FOBT is positive The CBC is stable and slightly improved. Repeat FOBT and CBC in one week. If the FOBT remains positive the patient should be scheduled to see the gastroenterologist as soon as possible.

## 2014-09-25 NOTE — Telephone Encounter (Signed)
Spoke with patient's wife.  Hbg is improving but still low - continue supplementation.  Recheck CBC and FOBT next week.

## 2014-10-02 ENCOUNTER — Other Ambulatory Visit: Payer: Medicare Other

## 2014-10-02 DIAGNOSIS — K921 Melena: Secondary | ICD-10-CM

## 2014-10-02 DIAGNOSIS — Z1212 Encounter for screening for malignant neoplasm of rectum: Secondary | ICD-10-CM

## 2014-10-03 ENCOUNTER — Other Ambulatory Visit (INDEPENDENT_AMBULATORY_CARE_PROVIDER_SITE_OTHER): Payer: Medicare Other

## 2014-10-03 DIAGNOSIS — D649 Anemia, unspecified: Secondary | ICD-10-CM

## 2014-10-03 NOTE — Progress Notes (Signed)
Lab only 

## 2014-10-03 NOTE — Addendum Note (Signed)
Addended by: Earlene Plater on: 10/03/2014 09:27 AM   Modules accepted: Orders

## 2014-10-04 LAB — CBC WITH DIFFERENTIAL
Basophils Absolute: 0.1 10*3/uL (ref 0.0–0.2)
Basos: 1 %
EOS: 3 %
Eosinophils Absolute: 0.2 10*3/uL (ref 0.0–0.4)
HCT: 30 % — ABNORMAL LOW (ref 37.5–51.0)
Hemoglobin: 10 g/dL — ABNORMAL LOW (ref 12.6–17.7)
IMMATURE GRANULOCYTES: 0 %
Immature Grans (Abs): 0 10*3/uL (ref 0.0–0.1)
LYMPHS ABS: 1.5 10*3/uL (ref 0.7–3.1)
Lymphs: 17 %
MCH: 31.4 pg (ref 26.6–33.0)
MCHC: 33.3 g/dL (ref 31.5–35.7)
MCV: 94 fL (ref 79–97)
MONOS ABS: 0.8 10*3/uL (ref 0.1–0.9)
Monocytes: 9 %
Neutrophils Absolute: 6 10*3/uL (ref 1.4–7.0)
Neutrophils Relative %: 70 %
Platelets: 320 10*3/uL (ref 150–379)
RBC: 3.18 x10E6/uL — AB (ref 4.14–5.80)
RDW: 13.4 % (ref 12.3–15.4)
WBC: 8.6 10*3/uL (ref 3.4–10.8)

## 2014-10-06 LAB — FECAL OCCULT BLOOD, IMMUNOCHEMICAL: FECAL OCCULT BLD: NEGATIVE

## 2014-10-07 ENCOUNTER — Other Ambulatory Visit: Payer: Self-pay | Admitting: *Deleted

## 2014-10-07 DIAGNOSIS — D649 Anemia, unspecified: Secondary | ICD-10-CM

## 2014-10-07 DIAGNOSIS — R195 Other fecal abnormalities: Secondary | ICD-10-CM

## 2014-10-08 ENCOUNTER — Encounter: Payer: Self-pay | Admitting: Interventional Cardiology

## 2014-10-08 ENCOUNTER — Encounter: Payer: Self-pay | Admitting: Internal Medicine

## 2014-10-08 ENCOUNTER — Ambulatory Visit (INDEPENDENT_AMBULATORY_CARE_PROVIDER_SITE_OTHER): Payer: Medicare Other | Admitting: Interventional Cardiology

## 2014-10-08 VITALS — BP 130/50 | HR 58 | Ht 70.5 in | Wt 220.1 lb

## 2014-10-08 DIAGNOSIS — J439 Emphysema, unspecified: Secondary | ICD-10-CM

## 2014-10-08 DIAGNOSIS — I5022 Chronic systolic (congestive) heart failure: Secondary | ICD-10-CM

## 2014-10-08 DIAGNOSIS — Z7901 Long term (current) use of anticoagulants: Secondary | ICD-10-CM | POA: Diagnosis not present

## 2014-10-08 DIAGNOSIS — D509 Iron deficiency anemia, unspecified: Secondary | ICD-10-CM

## 2014-10-08 DIAGNOSIS — I25708 Atherosclerosis of coronary artery bypass graft(s), unspecified, with other forms of angina pectoris: Secondary | ICD-10-CM

## 2014-10-08 DIAGNOSIS — I10 Essential (primary) hypertension: Secondary | ICD-10-CM

## 2014-10-08 DIAGNOSIS — I25798 Atherosclerosis of other coronary artery bypass graft(s) with other forms of angina pectoris: Secondary | ICD-10-CM

## 2014-10-08 MED ORDER — ASPIRIN EC 81 MG PO TBEC: 81.0000 mg | DELAYED_RELEASE_TABLET | ORAL | Status: DC

## 2014-10-08 NOTE — Progress Notes (Signed)
Patient ID: [name] [name], male   DOB: 04-11-1941, 74 y.o.   MRN: [id number]    [name] [place], [place]  [address] Phone: [phone] Fax:  [phone]  Date:  10/08/2014   ID:  [name] [name], DOB Aug 09, 1941, MRN [id number]  PCP:  [name], MD   ASSESSMENT:  1. Redeveloping anemia according to the patient, and this is being followed by primary care 2. Coronary artery disease with recent right coronary saphenous vein graft stent, bare metal purposefully because of known history of GI bleeding and inability to tolerate long-standing anticoagulation regimen 3. Chronic systolic heart failure, improved since stenting of the saphenous vein graft to the right coronary 4. Chronic anticoagulation with Coumadin. The patient was additionally on baby aspirin and Plavix. Plavix was discontinued one month after stent 5. COPD with chronic dyspnea  PLAN:  1. Decrease aspirin to 81 mg Monday Wednesday and Friday 2. Discontinue Plavix 3. Continue to have hemoglobin followed by primary care and GI 4. Continue heart failure therapy as currently prescribed 5. Clinical follow-up in 4-6 months   SUBJECTIVE: [name] [name] is a 74 y.o. male is doing relatively well. Reading is improved since stenting the saphenous vein graft to the right coronary. He received a bare metal stent because of prior difficulty with GI bleeding and anemia on anticoagulation therapy. He has been placed back on Coumadin for 1 month following stent was on aspirin and Plavix in addition. He has not had angina. He denies melena. Overall he feels improved but is somewhat concerned because his hemoglobin is dropped from 14 to near 10 over the past 2 months.   Wt Readings from Last 3 Encounters:  10/08/14 220 lb 1.9 oz (99.846 kg)  07/30/14 221 lb (100.245 kg)  07/15/14 218 lb (98.884 kg)     Past Medical History  Diagnosis Date  . Unspecified essential hypertension   . Anemia   . Asthma   .  Hepatomegaly   . Esophageal reflux   . Other and unspecified coagulation defects   . Hiatal hernia   . Personal history of colonic polyps 05/29/2010    TUBULAR ADENOMA  . CAD (coronary artery disease)   . Hypothyroidism   . Gout   . Hyperlipidemia   . Gastric antral vascular ectasia 2013  . Atrial fibrillation   . History of blood transfusion ~ 2012    "blood count dropped; had to get 3 units"  . Arthritis     "hips; back" (07/09/2014)  . On home oxygen therapy     "2L at night" (07/09/2014)    Current Outpatient Prescriptions  Medication Sig Dispense Refill  . acetaminophen (TYLENOL) 500 MG tablet Take 1,000 mg by mouth every 6 (six) hours as needed. For pain.     Marland Kitchen albuterol (PROVENTIL HFA;VENTOLIN HFA) 108 (90 BASE) MCG/ACT inhaler Inhale 2 puffs into the lungs every 6 (six) hours as needed. For shortness of breath.     Marland Kitchen aspirin EC 81 MG tablet Take 1 tablet (81 mg total) by mouth daily.    . budesonide (PULMICORT) 0.25 MG/2ML nebulizer solution Take 0.25 mg by nebulization 2 (two) times daily.     . Cholecalciferol (VITAMIN D3) 5000 UNITS CAPS Take 5,000 Units by mouth daily.     . clopidogrel (PLAVIX) 75 MG tablet Take 1 tablet (75 mg total) by mouth daily with breakfast. 30 tablet 1  . ferrous sulfate 325 (65 FE) MG tablet Take 325 mg by  mouth 3 (three) times daily.    . furosemide (LASIX) 40 MG tablet Take 40 mg by mouth 2 (two) times daily.    Marland Kitchen ipratropium-albuterol (DUONEB) 0.5-2.5 (3) MG/3ML SOLN Take 3 mLs by nebulization every 6 (six) hours as needed. For shortness of breath.     . isosorbide-hydrALAZINE (BIDIL) 20-37.5 MG per tablet Take 1 tablet by mouth 3 (three) times daily. 90 tablet 11  . levothyroxine (SYNTHROID, LEVOTHROID) 100 MCG tablet Take 1 tablet (100 mcg total) by mouth daily. 90 tablet 0  . losartan (COZAAR) 50 MG tablet Take 1 tablet (50 mg total) by mouth daily. 90 tablet 3  . lovastatin (MEVACOR) 40 MG tablet Take 1 tablet (40 mg total) by mouth at  bedtime. 90 tablet 2  . Multiple Vitamins-Minerals (MULTIVITAMINS THER. W/MINERALS) TABS Take 1 tablet by mouth daily.      . nitroGLYCERIN (NITROSTAT) 0.4 MG SL tablet Place 0.4 mg under the tongue every 5 (five) minutes as needed for chest pain.    Marland Kitchen omeprazole (PRILOSEC) 40 MG capsule Take 40 mg by mouth daily.    . potassium chloride (K-DUR) 10 MEQ tablet Take 1 tablet (10 mEq total) by mouth daily. 30 tablet 10  . warfarin (COUMADIN) 5 MG tablet TAKE 1 TABLET BY MOUTH EVERY DAY OR AS DIRECTED BY ANTICOAGULATION CLINIC 30 tablet 2   No current facility-administered medications for this visit.    Allergies:   No Known Allergies  Social History:  The patient  reports that he quit smoking about 17 years ago. His smoking use included Cigarettes. He has a 42 pack-year smoking history. He has never used smokeless tobacco. He reports that he drinks about 0.6 oz of alcohol per week. He reports that he does not use illicit drugs.   ROS:  Please see the history of present illness.   Denies cough and orthopnea. No edema.   All other systems reviewed and negative.   OBJECTIVE: VS:  BP 130/50 mmHg  Pulse 58  Ht 5' 10.5" (1.791 m)  Wt 220 lb 1.9 oz (99.846 kg)  BMI 31.13 kg/m2 Well nourished, well developed, in no acute distress, appears older than stated age 44: normal Neck: JVD flat. Carotid bruit absent  Cardiac:  normal S1, S2; IIRR; no murmur. Lungs:  clear to auscultation bilaterally, no wheezing, rhonchi or rales Abd: soft, nontender, no hepatomegaly Ext: Edema absent. Pulses 2+ and irregular Skin: warm and dry Neuro:  CNs 2-12 intact, no focal abnormalities noted  EKG:  Not repeated       Signed, Illene Labrador III, MD 10/08/2014 8:39 AM

## 2014-10-08 NOTE — Patient Instructions (Signed)
Your physician has recommended you make the following change in your medication:  1) REDUCE Aspirin to 81mg  daily every Mon,Wed,Fri. Take all other medications as prescribed  Your physician wants you to follow-up in: 6 months with Dr.Smith You will receive a reminder letter in the mail two months in advance. If you don't receive a letter, please call our office to schedule the follow-up appointment.

## 2014-10-09 ENCOUNTER — Other Ambulatory Visit: Payer: Self-pay | Admitting: *Deleted

## 2014-10-09 MED ORDER — POTASSIUM CHLORIDE ER 10 MEQ PO TBCR: 10.0000 meq | EXTENDED_RELEASE_TABLET | Freq: Every day | ORAL | Status: DC

## 2014-10-14 ENCOUNTER — Ambulatory Visit (HOSPITAL_COMMUNITY): Payer: Medicare Other | Attending: Cardiology | Admitting: Radiology

## 2014-10-14 DIAGNOSIS — R0600 Dyspnea, unspecified: Secondary | ICD-10-CM | POA: Diagnosis not present

## 2014-10-14 DIAGNOSIS — E669 Obesity, unspecified: Secondary | ICD-10-CM | POA: Insufficient documentation

## 2014-10-14 DIAGNOSIS — I081 Rheumatic disorders of both mitral and tricuspid valves: Secondary | ICD-10-CM | POA: Diagnosis not present

## 2014-10-14 DIAGNOSIS — I4891 Unspecified atrial fibrillation: Secondary | ICD-10-CM | POA: Insufficient documentation

## 2014-10-14 DIAGNOSIS — Z87891 Personal history of nicotine dependence: Secondary | ICD-10-CM | POA: Diagnosis not present

## 2014-10-14 DIAGNOSIS — I10 Essential (primary) hypertension: Secondary | ICD-10-CM

## 2014-10-14 DIAGNOSIS — I2510 Atherosclerotic heart disease of native coronary artery without angina pectoris: Secondary | ICD-10-CM | POA: Insufficient documentation

## 2014-10-14 DIAGNOSIS — I509 Heart failure, unspecified: Secondary | ICD-10-CM | POA: Insufficient documentation

## 2014-10-14 DIAGNOSIS — E785 Hyperlipidemia, unspecified: Secondary | ICD-10-CM | POA: Insufficient documentation

## 2014-10-14 DIAGNOSIS — J449 Chronic obstructive pulmonary disease, unspecified: Secondary | ICD-10-CM | POA: Insufficient documentation

## 2014-10-14 DIAGNOSIS — Z9981 Dependence on supplemental oxygen: Secondary | ICD-10-CM | POA: Insufficient documentation

## 2014-10-14 NOTE — Progress Notes (Signed)
Echocardiogram performed.  

## 2014-10-16 ENCOUNTER — Telehealth: Payer: Self-pay

## 2014-10-16 NOTE — Telephone Encounter (Signed)
Pt aware of echo results.  Echocardiogram looks good. Heart function is normal.  Pt verbalized understanding.

## 2014-10-16 NOTE — Telephone Encounter (Signed)
-----   Message from Sinclair Grooms, MD sent at 10/15/2014  5:51 PM EST ----- Echocardiogram looks good. Heart function is normal.

## 2014-10-17 ENCOUNTER — Other Ambulatory Visit: Payer: Self-pay | Admitting: *Deleted

## 2014-10-17 MED ORDER — FUROSEMIDE 40 MG PO TABS: 40.0000 mg | ORAL_TABLET | Freq: Two times a day (BID) | ORAL | Status: DC

## 2014-10-18 ENCOUNTER — Other Ambulatory Visit: Payer: Self-pay | Admitting: Family Medicine

## 2014-10-24 ENCOUNTER — Other Ambulatory Visit: Payer: Self-pay | Admitting: Nurse Practitioner

## 2014-10-28 ENCOUNTER — Other Ambulatory Visit: Payer: Self-pay

## 2014-10-28 DIAGNOSIS — Z1212 Encounter for screening for malignant neoplasm of rectum: Secondary | ICD-10-CM

## 2014-10-28 DIAGNOSIS — J449 Chronic obstructive pulmonary disease, unspecified: Secondary | ICD-10-CM | POA: Diagnosis not present

## 2014-10-28 NOTE — Progress Notes (Signed)
Lab only 

## 2014-10-30 ENCOUNTER — Other Ambulatory Visit: Payer: Self-pay

## 2014-10-30 ENCOUNTER — Ambulatory Visit (INDEPENDENT_AMBULATORY_CARE_PROVIDER_SITE_OTHER): Payer: Medicare Other | Admitting: Pharmacist

## 2014-10-30 DIAGNOSIS — I482 Chronic atrial fibrillation, unspecified: Secondary | ICD-10-CM

## 2014-10-30 DIAGNOSIS — D649 Anemia, unspecified: Secondary | ICD-10-CM

## 2014-10-30 DIAGNOSIS — I4891 Unspecified atrial fibrillation: Secondary | ICD-10-CM

## 2014-10-30 LAB — POCT INR: INR: 2.3

## 2014-10-30 LAB — FECAL OCCULT BLOOD, IMMUNOCHEMICAL: Fecal Occult Bld: NEGATIVE

## 2014-10-30 MED ORDER — ISOSORBIDE MONONITRATE ER 60 MG PO TB24: 60.0000 mg | ORAL_TABLET | Freq: Every day | ORAL | Status: DC

## 2014-10-30 MED ORDER — HYDRALAZINE HCL 25 MG PO TABS
25.0000 mg | ORAL_TABLET | Freq: Three times a day (TID) | ORAL | Status: DC
Start: 2014-10-30 — End: 2015-05-30

## 2014-10-30 NOTE — Patient Instructions (Signed)
Anticoagulation Dose Instructions as of 10/30/2014      Dorene Grebe Tue Wed Thu Fri Sat   New Dose 2.5 mg 5 mg 5 mg 5 mg 2.5 mg 5 mg 5 mg    Description        Goal INR 1.8-2.0 Continue warfarin 1/2 tablet on sundays and thursdays.  Take 1 tablet all other days.          INR was 2.3 today

## 2014-10-31 ENCOUNTER — Telehealth: Payer: Self-pay | Admitting: Pharmacist

## 2014-10-31 LAB — THYROID PANEL WITH TSH
FREE THYROXINE INDEX: 2.1 (ref 1.2–4.9)
T3 Uptake Ratio: 31 % (ref 24–39)
T4, Total: 6.7 ug/dL (ref 4.5–12.0)
TSH: 4.26 u[IU]/mL (ref 0.450–4.500)

## 2014-10-31 LAB — ANEMIA PROFILE B
Basophils Absolute: 0.1 10*3/uL (ref 0.0–0.2)
Basos: 1 %
EOS ABS: 0.3 10*3/uL (ref 0.0–0.4)
EOS: 4 %
FERRITIN: 31 ng/mL (ref 30–400)
HEMATOCRIT: 32.7 % — AB (ref 37.5–51.0)
Hemoglobin: 10.5 g/dL — ABNORMAL LOW (ref 12.6–17.7)
IMMATURE GRANS (ABS): 0 10*3/uL (ref 0.0–0.1)
Immature Granulocytes: 0 %
Iron Saturation: 24 % (ref 15–55)
Iron: 87 ug/dL (ref 38–169)
LYMPHS: 15 %
Lymphocytes Absolute: 1.2 10*3/uL (ref 0.7–3.1)
MCH: 31.5 pg (ref 26.6–33.0)
MCHC: 32.1 g/dL (ref 31.5–35.7)
MCV: 98 fL — AB (ref 79–97)
MONOCYTES: 10 %
MONOS ABS: 0.9 10*3/uL (ref 0.1–0.9)
NEUTROS PCT: 70 %
Neutrophils Absolute: 6 10*3/uL (ref 1.4–7.0)
Platelets: 305 10*3/uL (ref 150–379)
RBC: 3.33 x10E6/uL — ABNORMAL LOW (ref 4.14–5.80)
RDW: 13.9 % (ref 12.3–15.4)
Retic Ct Pct: 1.6 % (ref 0.6–2.6)
TIBC: 365 ug/dL (ref 250–450)
UIBC: 278 ug/dL (ref 111–343)
VITAMIN B 12: 428 pg/mL (ref 211–946)
WBC: 8.6 10*3/uL (ref 3.4–10.8)

## 2014-10-31 NOTE — Telephone Encounter (Signed)
Thyroid panel WNL.  Hbg improving. Continue with iron supplement and return FOBT

## 2014-11-01 ENCOUNTER — Telehealth: Payer: Self-pay

## 2014-11-01 ENCOUNTER — Other Ambulatory Visit: Payer: Self-pay | Admitting: *Deleted

## 2014-11-01 MED ORDER — LEVOTHYROXINE SODIUM 100 MCG PO TABS: 100.0000 ug | ORAL_TABLET | Freq: Every day | ORAL | Status: DC

## 2014-11-01 NOTE — Telephone Encounter (Signed)
Patient left message on Medical Record Voicemail. Would like a call back on his lab results from last office visit.  Please call before 4:00 p.m. Today.

## 2014-11-19 ENCOUNTER — Encounter: Payer: Self-pay | Admitting: *Deleted

## 2014-11-19 DIAGNOSIS — J45998 Other asthma: Secondary | ICD-10-CM | POA: Diagnosis not present

## 2014-11-19 DIAGNOSIS — J449 Chronic obstructive pulmonary disease, unspecified: Secondary | ICD-10-CM | POA: Diagnosis not present

## 2014-11-22 ENCOUNTER — Other Ambulatory Visit: Payer: Self-pay | Admitting: *Deleted

## 2014-11-22 MED ORDER — ALBUTEROL SULFATE HFA 108 (90 BASE) MCG/ACT IN AERS: 2.0000 | INHALATION_SPRAY | Freq: Four times a day (QID) | RESPIRATORY_TRACT | Status: DC | PRN

## 2014-11-25 DIAGNOSIS — J449 Chronic obstructive pulmonary disease, unspecified: Secondary | ICD-10-CM | POA: Diagnosis not present

## 2014-11-26 ENCOUNTER — Emergency Department (HOSPITAL_COMMUNITY): Payer: Medicare Other

## 2014-11-26 ENCOUNTER — Inpatient Hospital Stay (HOSPITAL_COMMUNITY)
Admission: EM | Admit: 2014-11-26 | Discharge: 2014-11-28 | DRG: 445 | Disposition: A | Discharge status: Home / Self Care | Payer: Medicare Other | Attending: Internal Medicine | Admitting: Internal Medicine

## 2014-11-26 ENCOUNTER — Encounter (HOSPITAL_COMMUNITY): Payer: Self-pay | Admitting: *Deleted

## 2014-11-26 DIAGNOSIS — R0989 Other specified symptoms and signs involving the circulatory and respiratory systems: Secondary | ICD-10-CM | POA: Diagnosis not present

## 2014-11-26 DIAGNOSIS — R1033 Periumbilical pain: Secondary | ICD-10-CM | POA: Diagnosis not present

## 2014-11-26 DIAGNOSIS — M79605 Pain in left leg: Secondary | ICD-10-CM | POA: Diagnosis not present

## 2014-11-26 DIAGNOSIS — Z7901 Long term (current) use of anticoagulants: Secondary | ICD-10-CM | POA: Diagnosis not present

## 2014-11-26 DIAGNOSIS — M109 Gout, unspecified: Secondary | ICD-10-CM | POA: Diagnosis present

## 2014-11-26 DIAGNOSIS — R52 Pain, unspecified: Secondary | ICD-10-CM | POA: Diagnosis not present

## 2014-11-26 DIAGNOSIS — J45909 Unspecified asthma, uncomplicated: Secondary | ICD-10-CM | POA: Diagnosis not present

## 2014-11-26 DIAGNOSIS — N179 Acute kidney failure, unspecified: Secondary | ICD-10-CM | POA: Diagnosis not present

## 2014-11-26 DIAGNOSIS — J439 Emphysema, unspecified: Secondary | ICD-10-CM

## 2014-11-26 DIAGNOSIS — I2510 Atherosclerotic heart disease of native coronary artery without angina pectoris: Secondary | ICD-10-CM | POA: Diagnosis not present

## 2014-11-26 DIAGNOSIS — Z87891 Personal history of nicotine dependence: Secondary | ICD-10-CM

## 2014-11-26 DIAGNOSIS — Z8249 Family history of ischemic heart disease and other diseases of the circulatory system: Secondary | ICD-10-CM | POA: Diagnosis not present

## 2014-11-26 DIAGNOSIS — J449 Chronic obstructive pulmonary disease, unspecified: Secondary | ICD-10-CM | POA: Diagnosis present

## 2014-11-26 DIAGNOSIS — K219 Gastro-esophageal reflux disease without esophagitis: Secondary | ICD-10-CM | POA: Diagnosis not present

## 2014-11-26 DIAGNOSIS — K5730 Diverticulosis of large intestine without perforation or abscess without bleeding: Secondary | ICD-10-CM | POA: Diagnosis not present

## 2014-11-26 DIAGNOSIS — Z79899 Other long term (current) drug therapy: Secondary | ICD-10-CM

## 2014-11-26 DIAGNOSIS — I129 Hypertensive chronic kidney disease with stage 1 through stage 4 chronic kidney disease, or unspecified chronic kidney disease: Secondary | ICD-10-CM | POA: Diagnosis present

## 2014-11-26 DIAGNOSIS — I700 Atherosclerosis of aorta: Secondary | ICD-10-CM | POA: Diagnosis present

## 2014-11-26 DIAGNOSIS — E785 Hyperlipidemia, unspecified: Secondary | ICD-10-CM | POA: Diagnosis present

## 2014-11-26 DIAGNOSIS — I252 Old myocardial infarction: Secondary | ICD-10-CM

## 2014-11-26 DIAGNOSIS — Z7902 Long term (current) use of antithrombotics/antiplatelets: Secondary | ICD-10-CM | POA: Diagnosis not present

## 2014-11-26 DIAGNOSIS — R1010 Upper abdominal pain, unspecified: Secondary | ICD-10-CM

## 2014-11-26 DIAGNOSIS — I482 Chronic atrial fibrillation, unspecified: Secondary | ICD-10-CM | POA: Diagnosis present

## 2014-11-26 DIAGNOSIS — K828 Other specified diseases of gallbladder: Secondary | ICD-10-CM | POA: Diagnosis not present

## 2014-11-26 DIAGNOSIS — D509 Iron deficiency anemia, unspecified: Secondary | ICD-10-CM | POA: Diagnosis present

## 2014-11-26 DIAGNOSIS — Z955 Presence of coronary angioplasty implant and graft: Secondary | ICD-10-CM

## 2014-11-26 DIAGNOSIS — R1110 Vomiting, unspecified: Secondary | ICD-10-CM | POA: Diagnosis not present

## 2014-11-26 DIAGNOSIS — Z6830 Body mass index (BMI) 30.0-30.9, adult: Secondary | ICD-10-CM

## 2014-11-26 DIAGNOSIS — N189 Chronic kidney disease, unspecified: Secondary | ICD-10-CM | POA: Diagnosis not present

## 2014-11-26 DIAGNOSIS — R1011 Right upper quadrant pain: Secondary | ICD-10-CM | POA: Diagnosis not present

## 2014-11-26 DIAGNOSIS — R1084 Generalized abdominal pain: Secondary | ICD-10-CM

## 2014-11-26 DIAGNOSIS — K810 Acute cholecystitis: Secondary | ICD-10-CM | POA: Diagnosis not present

## 2014-11-26 DIAGNOSIS — Z86010 Personal history of colonic polyps: Secondary | ICD-10-CM

## 2014-11-26 DIAGNOSIS — M25512 Pain in left shoulder: Secondary | ICD-10-CM | POA: Diagnosis not present

## 2014-11-26 DIAGNOSIS — R109 Unspecified abdominal pain: Secondary | ICD-10-CM | POA: Diagnosis present

## 2014-11-26 DIAGNOSIS — M1389 Other specified arthritis, multiple sites: Secondary | ICD-10-CM | POA: Diagnosis present

## 2014-11-26 DIAGNOSIS — I429 Cardiomyopathy, unspecified: Secondary | ICD-10-CM | POA: Diagnosis not present

## 2014-11-26 DIAGNOSIS — I5022 Chronic systolic (congestive) heart failure: Secondary | ICD-10-CM | POA: Diagnosis present

## 2014-11-26 DIAGNOSIS — E039 Hypothyroidism, unspecified: Secondary | ICD-10-CM | POA: Diagnosis present

## 2014-11-26 DIAGNOSIS — Z9981 Dependence on supplemental oxygen: Secondary | ICD-10-CM | POA: Diagnosis not present

## 2014-11-26 DIAGNOSIS — Z951 Presence of aortocoronary bypass graft: Secondary | ICD-10-CM

## 2014-11-26 DIAGNOSIS — Z7982 Long term (current) use of aspirin: Secondary | ICD-10-CM | POA: Diagnosis not present

## 2014-11-26 DIAGNOSIS — R509 Fever, unspecified: Secondary | ICD-10-CM

## 2014-11-26 DIAGNOSIS — E669 Obesity, unspecified: Secondary | ICD-10-CM | POA: Diagnosis present

## 2014-11-26 DIAGNOSIS — I10 Essential (primary) hypertension: Secondary | ICD-10-CM | POA: Diagnosis present

## 2014-11-26 DIAGNOSIS — K551 Chronic vascular disorders of intestine: Secondary | ICD-10-CM | POA: Diagnosis not present

## 2014-11-26 LAB — URINE MICROSCOPIC-ADD ON

## 2014-11-26 LAB — PROTIME-INR
INR: 1.92 — AB (ref 0.00–1.49)
Prothrombin Time: 22.1 seconds — ABNORMAL HIGH (ref 11.6–15.2)

## 2014-11-26 LAB — COMPREHENSIVE METABOLIC PANEL
ALBUMIN: 4.2 g/dL (ref 3.5–5.2)
ALT: 19 U/L (ref 0–53)
ANION GAP: 8 (ref 5–15)
AST: 24 U/L (ref 0–37)
Alkaline Phosphatase: 48 U/L (ref 39–117)
BUN: 32 mg/dL — AB (ref 6–23)
CHLORIDE: 104 mmol/L (ref 96–112)
CO2: 27 mmol/L (ref 19–32)
CREATININE: 1.46 mg/dL — AB (ref 0.50–1.35)
Calcium: 9.1 mg/dL (ref 8.4–10.5)
GFR calc Af Amer: 52 mL/min — ABNORMAL LOW (ref >90)
GFR, EST NON AFRICAN AMERICAN: 45 mL/min — AB (ref >90)
Glucose, Bld: 125 mg/dL — ABNORMAL HIGH (ref 70–99)
Potassium: 4.3 mmol/L (ref 3.5–5.1)
Sodium: 139 mmol/L (ref 135–145)
Total Bilirubin: 0.4 mg/dL (ref 0.3–1.2)
Total Protein: 7.5 g/dL (ref 6.0–8.3)

## 2014-11-26 LAB — LIPASE, BLOOD: Lipase: 24 U/L (ref 11–59)

## 2014-11-26 LAB — CBC WITH DIFFERENTIAL/PLATELET
BASOS ABS: 0 10*3/uL (ref 0.0–0.1)
BASOS PCT: 0 % (ref 0–1)
EOS ABS: 0.3 10*3/uL (ref 0.0–0.7)
Eosinophils Relative: 2 % (ref 0–5)
HCT: 31.4 % — ABNORMAL LOW (ref 39.0–52.0)
HEMOGLOBIN: 10 g/dL — AB (ref 13.0–17.0)
Lymphocytes Relative: 8 % — ABNORMAL LOW (ref 12–46)
Lymphs Abs: 1.1 10*3/uL (ref 0.7–4.0)
MCH: 31.5 pg (ref 26.0–34.0)
MCHC: 31.8 g/dL (ref 30.0–36.0)
MCV: 99.1 fL (ref 78.0–100.0)
MONOS PCT: 7 % (ref 3–12)
Monocytes Absolute: 1.1 10*3/uL — ABNORMAL HIGH (ref 0.1–1.0)
NEUTROS ABS: 12.1 10*3/uL — AB (ref 1.7–7.7)
NEUTROS PCT: 83 % — AB (ref 43–77)
Platelets: 336 10*3/uL (ref 150–400)
RBC: 3.17 MIL/uL — ABNORMAL LOW (ref 4.22–5.81)
RDW: 14.6 % (ref 11.5–15.5)
WBC: 14.6 10*3/uL — ABNORMAL HIGH (ref 4.0–10.5)

## 2014-11-26 LAB — TSH: TSH: 7.666 u[IU]/mL — ABNORMAL HIGH (ref 0.350–4.500)

## 2014-11-26 LAB — URINALYSIS, ROUTINE W REFLEX MICROSCOPIC
BILIRUBIN URINE: NEGATIVE
GLUCOSE, UA: NEGATIVE mg/dL
Hgb urine dipstick: NEGATIVE
Ketones, ur: NEGATIVE mg/dL
LEUKOCYTES UA: NEGATIVE
Nitrite: NEGATIVE
Protein, ur: 30 mg/dL — AB
Specific Gravity, Urine: 1.02 (ref 1.005–1.030)
Urobilinogen, UA: 0.2 mg/dL (ref 0.0–1.0)
pH: 6 (ref 5.0–8.0)

## 2014-11-26 LAB — LACTIC ACID, PLASMA
LACTIC ACID, VENOUS: 0.7 mmol/L (ref 0.5–2.0)
Lactic Acid, Venous: 1.6 mmol/L (ref 0.5–2.0)

## 2014-11-26 MED ORDER — SODIUM CHLORIDE 0.9 % IV SOLN
INTRAVENOUS | Status: DC
  Administered 2014-11-26: 04:00:00 via INTRAVENOUS

## 2014-11-26 MED ORDER — FENTANYL CITRATE 0.05 MG/ML IJ SOLN
50.0000 ug | Freq: Once | INTRAMUSCULAR | Status: AC
  Administered 2014-11-26: 50 ug via INTRAVENOUS
  Filled 2014-11-26: qty 2

## 2014-11-26 MED ORDER — PANTOPRAZOLE SODIUM 40 MG PO TBEC
40.0000 mg | DELAYED_RELEASE_TABLET | Freq: Every day | ORAL | Status: DC
  Administered 2014-11-26 – 2014-11-28 (×3): 40 mg via ORAL
  Filled 2014-11-26 (×3): qty 1

## 2014-11-26 MED ORDER — IPRATROPIUM-ALBUTEROL 0.5-2.5 (3) MG/3ML IN SOLN
3.0000 mL | Freq: Four times a day (QID) | RESPIRATORY_TRACT | Status: DC | PRN
  Administered 2014-11-26: 3 mL via RESPIRATORY_TRACT
  Filled 2014-11-26: qty 33

## 2014-11-26 MED ORDER — SODIUM CHLORIDE 0.9 % IJ SOLN
3.0000 mL | Freq: Two times a day (BID) | INTRAMUSCULAR | Status: DC
  Administered 2014-11-26 – 2014-11-27 (×3): 3 mL via INTRAVENOUS

## 2014-11-26 MED ORDER — HYDROCODONE-ACETAMINOPHEN 5-325 MG PO TABS
1.0000 | ORAL_TABLET | ORAL | Status: DC | PRN
  Administered 2014-11-27: 1 via ORAL
  Administered 2014-11-27: 2 via ORAL
  Filled 2014-11-26: qty 1
  Filled 2014-11-26: qty 2

## 2014-11-26 MED ORDER — ACETAMINOPHEN 325 MG PO TABS: 650.0000 mg | ORAL_TABLET | Freq: Four times a day (QID) | ORAL | Status: DC | PRN

## 2014-11-26 MED ORDER — SODIUM CHLORIDE 0.9 % IV SOLN
INTRAVENOUS | Status: DC
  Administered 2014-11-26 – 2014-11-27 (×2): via INTRAVENOUS

## 2014-11-26 MED ORDER — PIPERACILLIN-TAZOBACTAM 3.375 G IVPB
3.3750 g | Freq: Three times a day (TID) | INTRAVENOUS | Status: DC
  Administered 2014-11-26 – 2014-11-27 (×3): 3.375 g via INTRAVENOUS
  Filled 2014-11-26 (×6): qty 50

## 2014-11-26 MED ORDER — SODIUM CHLORIDE 0.9 % IV SOLN: INTRAVENOUS | Status: DC

## 2014-11-26 MED ORDER — ALUM & MAG HYDROXIDE-SIMETH 200-200-20 MG/5ML PO SUSP: 30.0000 mL | Freq: Four times a day (QID) | ORAL | Status: DC | PRN

## 2014-11-26 MED ORDER — ACETAMINOPHEN 650 MG RE SUPP: 650.0000 mg | Freq: Four times a day (QID) | RECTAL | Status: DC | PRN

## 2014-11-26 MED ORDER — HYDROMORPHONE HCL 1 MG/ML IJ SOLN
0.5000 mg | INTRAMUSCULAR | Status: DC | PRN
  Administered 2014-11-26: 0.5 mg via INTRAVENOUS
  Filled 2014-11-26: qty 1

## 2014-11-26 MED ORDER — HYDROMORPHONE HCL 1 MG/ML IJ SOLN
1.0000 mg | INTRAMUSCULAR | Status: DC | PRN
  Administered 2014-11-26 – 2014-11-27 (×4): 1 mg via INTRAVENOUS
  Filled 2014-11-26 (×4): qty 1

## 2014-11-26 MED ORDER — IPRATROPIUM-ALBUTEROL 0.5-2.5 (3) MG/3ML IN SOLN
3.0000 mL | Freq: Two times a day (BID) | RESPIRATORY_TRACT | Status: DC
  Administered 2014-11-27 – 2014-11-28 (×3): 3 mL via RESPIRATORY_TRACT
  Filled 2014-11-26 (×4): qty 3

## 2014-11-26 MED ORDER — PRAVASTATIN SODIUM 10 MG PO TABS
10.0000 mg | ORAL_TABLET | Freq: Every day | ORAL | Status: DC
  Administered 2014-11-26 – 2014-11-27 (×2): 10 mg via ORAL
  Filled 2014-11-26 (×2): qty 1

## 2014-11-26 MED ORDER — HYDROMORPHONE HCL 1 MG/ML IJ SOLN
1.0000 mg | Freq: Once | INTRAMUSCULAR | Status: AC
  Administered 2014-11-26: 1 mg via INTRAVENOUS
  Filled 2014-11-26: qty 1

## 2014-11-26 MED ORDER — ALBUTEROL SULFATE (2.5 MG/3ML) 0.083% IN NEBU
3.0000 mL | INHALATION_SOLUTION | Freq: Four times a day (QID) | RESPIRATORY_TRACT | Status: DC | PRN
  Administered 2014-11-26 – 2014-11-28 (×3): 3 mL via RESPIRATORY_TRACT
  Filled 2014-11-26 (×4): qty 3

## 2014-11-26 MED ORDER — ONDANSETRON HCL 4 MG/2ML IJ SOLN: 4.0000 mg | Freq: Four times a day (QID) | INTRAMUSCULAR | Status: DC | PRN

## 2014-11-26 MED ORDER — ISOSORB DINITRATE-HYDRALAZINE 20-37.5 MG PO TABS
1.0000 | ORAL_TABLET | Freq: Three times a day (TID) | ORAL | Status: DC
  Administered 2014-11-26 – 2014-11-27 (×3): 1 via ORAL
  Filled 2014-11-26 (×9): qty 1

## 2014-11-26 MED ORDER — TRAZODONE HCL 50 MG PO TABS: 25.0000 mg | ORAL_TABLET | Freq: Every evening | ORAL | Status: DC | PRN

## 2014-11-26 MED ORDER — IOHEXOL 350 MG/ML SOLN
100.0000 mL | Freq: Once | INTRAVENOUS | Status: AC | PRN
  Administered 2014-11-26: 80 mL via INTRAVENOUS

## 2014-11-26 MED ORDER — PIPERACILLIN-TAZOBACTAM 3.375 G IVPB 30 MIN
3.3750 g | INTRAVENOUS | Status: AC
  Administered 2014-11-26: 3.375 g via INTRAVENOUS
  Filled 2014-11-26: qty 50

## 2014-11-26 MED ORDER — LEVOTHYROXINE SODIUM 100 MCG PO TABS
100.0000 ug | ORAL_TABLET | Freq: Every day | ORAL | Status: DC
  Administered 2014-11-27 – 2014-11-28 (×2): 100 ug via ORAL
  Filled 2014-11-26 (×2): qty 1

## 2014-11-26 MED ORDER — SENNA 8.6 MG PO TABS
1.0000 | ORAL_TABLET | Freq: Two times a day (BID) | ORAL | Status: DC
  Administered 2014-11-26 – 2014-11-28 (×5): 8.6 mg via ORAL
  Filled 2014-11-26 (×5): qty 1

## 2014-11-26 MED ORDER — BUDESONIDE 0.25 MG/2ML IN SUSP
0.2500 mg | Freq: Two times a day (BID) | RESPIRATORY_TRACT | Status: DC
  Administered 2014-11-26 – 2014-11-28 (×4): 0.25 mg via RESPIRATORY_TRACT
  Filled 2014-11-26 (×6): qty 2

## 2014-11-26 MED ORDER — FERROUS SULFATE 325 (65 FE) MG PO TABS
325.0000 mg | ORAL_TABLET | Freq: Three times a day (TID) | ORAL | Status: DC
  Administered 2014-11-26 – 2014-11-28 (×6): 325 mg via ORAL
  Filled 2014-11-26 (×6): qty 1

## 2014-11-26 MED ORDER — ONDANSETRON HCL 4 MG PO TABS: 4.0000 mg | ORAL_TABLET | Freq: Four times a day (QID) | ORAL | Status: DC | PRN

## 2014-11-26 MED ORDER — ONDANSETRON HCL 4 MG/2ML IJ SOLN
4.0000 mg | Freq: Once | INTRAMUSCULAR | Status: AC
  Administered 2014-11-26: 4 mg via INTRAVENOUS
  Filled 2014-11-26: qty 2

## 2014-11-26 NOTE — ED Notes (Signed)
Pt's O2 sats dropped to 85%. Pt fell asleep after receiving the medication and the pt's wife reports that he sleeps with oxygen on. O2 given via nasal cannula at 2L/min. O2 sats returned to 96%.

## 2014-11-26 NOTE — ED Notes (Signed)
Patient would like something for pain at this time.

## 2014-11-26 NOTE — ED Notes (Signed)
MD at bedside. 

## 2014-11-26 NOTE — Progress Notes (Signed)
Pt setup on CPAP auto titrate with 2lpm cann bleed in pt tolerate well will continue to monitor throughout the night

## 2014-11-26 NOTE — ED Notes (Signed)
Attempted to call report. RN unavailable 

## 2014-11-26 NOTE — Consult Note (Signed)
Reason for Consult: Gallbladder sludge Referring Physician: Hospitalist  Donelle R Michiels is an 74 y.o. male.  HPI: Patient is a 74 year old white male who had a sudden onset of lower abdominal pain yesterday evening. This occurred spontaneously. He had not eaten much for dinner. No one else is sick at home. The abdominal pain radiates around to both flanks. He denies any right upper quadrant abdominal pain, fever, chills, or jaundice. He states he has not had an episode like this in the past. He was scheduled undergo a routine colonoscopy in 3 days. No diarrhea or constipation abdomen noted.  Past Medical History  Diagnosis Date  . Unspecified essential hypertension   . Anemia   . Asthma   . Hepatomegaly   . Esophageal reflux   . Other and unspecified coagulation defects   . Hiatal hernia   . Personal history of colonic polyps 05/29/2010    TUBULAR ADENOMA  . CAD (coronary artery disease)   . Hypothyroidism   . Gout   . Hyperlipidemia   . Gastric antral vascular ectasia 2013  . Atrial fibrillation   . History of blood transfusion ~ 2012    "blood count dropped; had to get 3 units"  . Arthritis     "hips; back" (07/09/2014)  . On home oxygen therapy     "2L at night" (07/09/2014)  . COPD (chronic obstructive pulmonary disease)     Past Surgical History  Procedure Laterality Date  . Umbilical hernia repair    . Coronary angioplasty with stent placement  ~ 2008; 07/08/2014    "1 + 3"  . 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 Hernia repair    . Umbilical hernia repair  1970's  . Left and right heart catheterization with coronary/graft angiogram N/A 07/09/2014    Procedure: LEFT AND RIGHT HEART CATHETERIZATION WITH Beatrix Fetters;  Surgeon: Sinclair Grooms, MD;  Location: [facility] CATH LAB;  Service: Cardiovascular;  Laterality: N/A;    Family History  Problem Relation Age of Onset  . Leukemia Mother   . Kidney disease Mother     kidney removed   . Colon cancer  Neg Hx   . Heart attack Brother   . Hypertension      family   . Cancer Father   . Heart disease Father   . Stomach cancer Sister   . Stomach cancer Sister   . Early death Brother     Social History:  reports that he quit smoking about 17 years ago. His smoking use included Cigarettes. He has a 42 pack-year smoking history. He has never used smokeless tobacco. He reports that he drinks about 0.6 oz of alcohol per week. He reports that he does not use illicit drugs.  Allergies: No Known Allergies  Medications: I have reviewed the patient's current medications.  Results for orders placed or performed during the hospital encounter of 11/26/14 (from the past 48 hour(s))  Comprehensive metabolic panel     Status: Abnormal   Collection Time: 11/26/14  4:04 AM  Result Value Ref Range   Sodium 139 135 - 145 mmol/L   Potassium 4.3 3.5 - 5.1 mmol/L   Chloride 104 96 - 112 mmol/L   CO2 27 19 - 32 mmol/L   Glucose, Bld 125 (H) 70 - 99 mg/dL   BUN 32 (H) 6 - 23 mg/dL   Creatinine, Ser 1.46 (H) 0.50 - 1.35 mg/dL   Calcium 9.1 8.4 - 10.5  mg/dL   Total Protein 7.5 6.0 - 8.3 g/dL   Albumin 4.2 3.5 - 5.2 g/dL   AST 24 0 - 37 U/L   ALT 19 0 - 53 U/L   Alkaline Phosphatase 48 39 - 117 U/L   Total Bilirubin 0.4 0.3 - 1.2 mg/dL   GFR calc non Af Amer 45 (L) >90 mL/min   GFR calc Af Amer 52 (L) >90 mL/min    Comment: (NOTE) The eGFR has been calculated using the CKD EPI equation. This calculation has not been validated in all clinical situations. eGFR's persistently <90 mL/min signify possible Chronic Kidney Disease.    Anion gap 8 5 - 15  CBC with Differential     Status: Abnormal   Collection Time: 11/26/14  4:04 AM  Result Value Ref Range   WBC 14.6 (H) 4.0 - 10.5 K/uL   RBC 3.17 (L) 4.22 - 5.81 MIL/uL   Hemoglobin 10.0 (L) 13.0 - 17.0 g/dL   HCT 31.4 (L) 39.0 - 52.0 %   MCV 99.1 78.0 - 100.0 fL   MCH 31.5 26.0 - 34.0 pg   MCHC 31.8 30.0 - 36.0 g/dL   RDW 14.6 11.5 - 15.5 %    Platelets 336 150 - 400 K/uL   Neutrophils Relative % 83 (H) 43 - 77 %   Neutro Abs 12.1 (H) 1.7 - 7.7 K/uL   Lymphocytes Relative 8 (L) 12 - 46 %   Lymphs Abs 1.1 0.7 - 4.0 K/uL   Monocytes Relative 7 3 - 12 %   Monocytes Absolute 1.1 (H) 0.1 - 1.0 K/uL   Eosinophils Relative 2 0 - 5 %   Eosinophils Absolute 0.3 0.0 - 0.7 K/uL   Basophils Relative 0 0 - 1 %   Basophils Absolute 0.0 0.0 - 0.1 K/uL  Lipase, blood     Status: None   Collection Time: 11/26/14  4:04 AM  Result Value Ref Range   Lipase 24 11 - 59 U/L  Protime-INR     Status: Abnormal   Collection Time: 11/26/14  4:04 AM  Result Value Ref Range   Prothrombin Time 22.1 (H) 11.6 - 15.2 seconds   INR 1.92 (H) 0.00 - 1.49  Urinalysis, Routine w reflex microscopic     Status: Abnormal   Collection Time: 11/26/14  4:55 AM  Result Value Ref Range   Color, Urine YELLOW YELLOW   APPearance CLEAR CLEAR   Specific Gravity, Urine 1.020 1.005 - 1.030   pH 6.0 5.0 - 8.0   Glucose, UA NEGATIVE NEGATIVE mg/dL   Hgb urine dipstick NEGATIVE NEGATIVE   Bilirubin Urine NEGATIVE NEGATIVE   Ketones, ur NEGATIVE NEGATIVE mg/dL   Protein, ur 30 (A) NEGATIVE mg/dL   Urobilinogen, UA 0.2 0.0 - 1.0 mg/dL   Nitrite NEGATIVE NEGATIVE   Leukocytes, UA NEGATIVE NEGATIVE  Urine microscopic-add on     Status: None   Collection Time: 11/26/14  4:55 AM  Result Value Ref Range   RBC / HPF 0-2 <3 RBC/hpf  Lactic acid, plasma     Status: None   Collection Time: 11/26/14  6:59 AM  Result Value Ref Range   Lactic Acid, Venous 0.7 0.5 - 2.0 mmol/L  Lactic acid, plasma     Status: None   Collection Time: 11/26/14  9:46 AM  Result Value Ref Range   Lactic Acid, Venous 1.6 0.5 - 2.0 mmol/L    Ct Abdomen Pelvis Wo Contrast  11/26/2014   CLINICAL DATA:  Upper  abdominal pain radiating to the back. Concern for abdominal aortic aneurysm.  EXAM: CT ABDOMEN AND PELVIS WITHOUT CONTRAST  TECHNIQUE: Multidetector CT imaging of the abdomen and pelvis was  performed following the standard protocol without IV contrast.  COMPARISON:  None.  FINDINGS: There is linear atelectasis in the right lower lobe and lingula. The heart is enlarged. Coronary artery calcifications are seen.  The abdominal aorta is normal in caliber without aneurysmal dilatation. There is dense atherosclerosis of the abdominal aorta and its branches. Dense calcification noted at the origin of the superior mesenteric artery. There is no periaortic soft tissue stranding or retroperitoneal fluid.  Probable hepatic steatosis. No focal hepatic lesion on noncontrast exam. Gallbladder mildly distended. No frank pericholecystic inflammatory change. The spleen, pancreas, and adrenal glands are normal.  There is no hydronephrosis. Renovascular calcification seen of both renal hila, no definite renal stones. There is mild nonspecific perinephric stranding.  Stomach is distended with ingested contents. There is a small hiatal hernia. There are no dilated or thickened bowel loops. The appendix is normal. There scattered colonic diverticular without diverticulitis. No free air, free fluid, or intra-abdominal fluid collection. Surgical clips in the anterior abdominal wall at the emboli kiss, likely from hernia repair  Urinary bladder is minimally distended. Prostate gland is normal in size with central prostatic calcifications. No pelvic free fluid. Dense atherosclerosis of the pelvic vasculature.  There is degenerative disc disease and facet arthropathy in the spine. No acute or suspicious osseous abnormality.  IMPRESSION: 1. Normal caliber abdominal aorta without aneurysm. There is dense atherosclerosis of the aorta and its branches. 2. Mildly distended gallbladder but without pericholecystic inflammatory change. If there is clinical concern for biliary pathology, consider right upper quadrant ultrasound. 3. Colonic diverticulosis without diverticulitis.   Electronically Signed   By: Jeb Levering M.D.   On:  11/26/2014 05:47   US Abdomen Limited  11/26/2014   CLINICAL DATA:  Abdominal pain  EXAM: US ABDOMEN LIMITED - RIGHT UPPER QUADRANT  COMPARISON:  11/26/2014  FINDINGS: Gallbladder:  The gallbladder is well distended. Wall thickening is noted to 4.5 mm. A negative sonographic Percell Miller sign is elicited. Echogenic material is noted within the gallbladder consistent with tumefactive sludge. No definitive gallstones are seen.  Common bile duct:  Diameter: 3.3 mm  Liver:  No focal lesion identified. Within normal limits in parenchymal echogenicity.  IMPRESSION: Gallbladder wall thickening. Tumefactive sludge is noted. In the appropriate clinical setting, these changes could represent acute cholecystitis.  No other focal abnormality is noted.   Electronically Signed   By: Inez Catalina M.D.   On: 11/26/2014 10:14   Ct Cta Abd/pel W/cm &/or W/o Cm  11/26/2014   CLINICAL DATA:  Abdominal pain radiating to both sides and into upper back. History of esophageal reflux. Hiatal hernia. Coronary artery disease.  EXAM: CTA ABDOMEN AND PELVIS wITHOUT AND WITH CONTRAST  TECHNIQUE: Multidetector CT imaging of the abdomen and pelvis was performed using the standard protocol during bolus administration of intravenous contrast. Multiplanar reconstructed images and MIPs were obtained and reviewed to evaluate the vascular anatomy.  CONTRAST:  55m OMNIPAQUE IOHEXOL 350 MG/ML SOLN  COMPARISON:  Stone study of earlier today.  FINDINGS: Lower chest: Bibasilar atelectasis and/or scarring. Mild cardiomegaly with prior median sternotomy. No pericardial or pleural effusion.  Hepatobiliary: No focal liver lesion. Suggestion of hyperemia adjacent the gallbladder, including on image 36 of series 4.  The gallbladder is mildly distended with subtle/possible pericholecystic edema. No calcified stones. No biliary  ductal dilatation.  Pancreas: Normal, without mass or ductal dilatation.  Spleen: Normal  Adrenals/Urinary Tract: Normal adrenal glands.  Mildly atrophic left kidney, without hydronephrosis or hydroureter. Normal appearance of the right kidney for age with renal vascular calcifications. Normal urinary bladder.  Stomach/Bowel: Normal stomach, without wall thickening. Normal colon, appendix, and terminal ileum. Normal small bowel.  Vascular/Lymphatic: Abdominal aorta is normal in caliber, without evidence of dissection. No significant stenosis at the celiac origin. There is mild to moderate atherosclerotic irregularity at the origin of the superior mesenteric artery. 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 There is an accessory lower pole left renal artery which is relatively diminutive.  The right renal artery demonstrates mild-to-moderate atherosclerotic narrowing at its origin. Inferior mesenteric artery is patent, as are both internal iliac arteries. Significant atherosclerotic irregularity involves the external iliac arteries and common femoral arteries bilaterally.  No abdominopelvic adenopathy.  Reproductive: Mild prostatomegaly.  Other: No significant free fluid. Prior ventral abdominal wall hernia repair.  Musculoskeletal: Advanced spondylosis, without acute osseous abnormality.  Review of the MIP images confirms the above findings.  IMPRESSION: 1. Extensive atherosclerotic irregularity throughout the aorta, without evidence of dissection. 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 2. Mild gallbladder distention with suspicion of mild pericholecystic edema. Acute cholecystitis cannot be excluded. Consider abdominal ultrasound. 3. No other explanation for patient's symptoms.   Electronically Signed   By: Abigail Miyamoto M.D.   On: 11/26/2014 07:41    ROS: See chart Blood pressure 206/66, pulse 69, temperature 97.6 F (36.4 C), temperature source Oral, resp. rate 15, height _0  (1.778 m), weight 99.791 kg (220 lb), SpO2 96 %. Physical  Exam: Pleasant white male in no acute distress. His abdomen is soft but distended. No rigidity is noted. I could not elicit specific point tenderness on his abdomen. He does have some bowel sounds. No hernias are appreciated.  Assessment/Plan: Impression: Abdominal pain of unknown etiology. He does not give a classic signs for biliary colic. Gallbladder findings may be incidental. His liver tests were normal. No need for cholecystectomy at this time. Plan: Would continue observation. Would hold Coumadin for now. May advance diet as tolerated. Will follow with you.  Tequan Redmon A 11/26/2014, 1:29 PM

## 2014-11-26 NOTE — H&P (Signed)
[facility] History and Physical  [name] [name] [id number] DOB: [birth year] DOA: 11/26/2014  Referring physician:  PCP: [name], MD   Chief Complaint: intractable abdominal pain  HPI: [name] [name] is a 74 y.o. 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 fib, hypertension, hyperlipidemia, COPD presents to the emergency department with the chief complaint of persistent intractable abdominal pain. Initial evaluation concerning for intra-abdominal infection and/or acute cholecystitis as well as acute renal failure. Patient reports development of sudden onset abdominal pain last evening. Describes the pain is sharp constant worsening located in lower quadrants radiating around 2 sides and lower back. He denies any nausea vomiting constipation diarrhea melena bright red blood per rectum. He denies a chest pain palpitation shortness of breath cough headache fever chills. He denies dysuria hematuria frequency or urgency. Workup in the emergency department includes complete blood count significant for WBC of 14.6, hemoglobin 10.0 hematocrit [id number], basic metabolic panel reveals BUN 32 creatinine 1.46 serum glucose 125. INR is 1.92. Abdominal ultrasound was gallbladder wall thickening tumefactive sludge. CT of the abdomen with extensive atherosclerotic irregularity throughout the aorta without dissection, significant stenosis involving the left renal artery origin, mild gallbladder distention with suspicion of mild Cholecystic edema.  The time of my exam he appears slightly uncomfortable but cooperative. He is hemodynamically stable afebrile and not hypoxic. In the emergency department he is given Zosyn, IV fluids statin L Dilaudid and Zofran. Case was discussed with Dr. Arnoldo Morale general surgery who agreed to consult.   Review of Systems:  10 point review of systems complete and all systems are  negative except as indicated in the history of present illness  Past Medical History  Diagnosis Date  . Unspecified essential hypertension   . Anemia   . Asthma   . Hepatomegaly   . Esophageal reflux   . Other and unspecified coagulation defects   . Hiatal hernia   . Personal history of colonic polyps 05/29/2010    TUBULAR ADENOMA  . CAD (coronary artery disease)   . Hypothyroidism   . Gout   . Hyperlipidemia   . Gastric antral vascular ectasia 2013  . Atrial fibrillation   . History of blood transfusion ~ 2012    "blood count dropped; had to get 3 units"  . Arthritis     "hips; back" (07/09/2014)  . On home oxygen therapy     "2L at night" (07/09/2014)  . COPD (chronic obstructive pulmonary disease)    Past Surgical History  Procedure Laterality Date  . Umbilical hernia repair    . Coronary angioplasty with stent placement  ~ 2008; 07/08/2014    "1 + 3"  . 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 Hernia repair    . Umbilical hernia repair  1970's  . Left and right heart catheterization with coronary/graft angiogram N/A 07/09/2014    Procedure: LEFT AND RIGHT HEART CATHETERIZATION WITH Beatrix Fetters;  Surgeon: Sinclair Grooms, MD;  Location: [facility] CATH LAB;  Service: Cardiovascular;  Laterality: N/A;   Social History:  reports that he quit smoking about 17 years ago. His smoking use included Cigarettes. He has a 42 pack-year smoking history. He has never used smokeless tobacco. He reports that he drinks about 0.6 oz of alcohol per week. He reports that he does not use illicit drugs.  No Known Allergies  Family History  Problem Relation  Age of Onset  . Leukemia Mother   . Kidney disease Mother     kidney removed   . Colon cancer Neg Hx   . Heart attack Brother   . Hypertension      family   . Cancer Father   . Heart disease Father   . Stomach cancer Sister   . Stomach cancer Sister   . Early death Brother      Prior to Admission medications     Medication Sig Start Date End Date Taking? Authorizing Provider  acetaminophen (TYLENOL) 500 MG tablet Take 1,000 mg by mouth every 6 (six) hours as needed. For pain.    Yes Historical Provider, MD  albuterol (PROVENTIL HFA;VENTOLIN HFA) 108 (90 BASE) MCG/ACT inhaler Inhale 2 puffs into the lungs every 6 (six) hours as needed. For shortness of breath. 11/22/14  Yes Chipper Herb, MD  aspirin EC 81 MG tablet Take 1 tablet (81 mg total) by mouth every Monday, Wednesday, and Friday. 10/08/14  Yes Belva Crome III, MD  budesonide (PULMICORT) 0.25 MG/2ML nebulizer solution Take 0.25 mg by nebulization 2 (two) times daily.    Yes Historical Provider, MD  Cholecalciferol (VITAMIN D3) 5000 UNITS CAPS Take 5,000 Units by mouth daily.    Yes Historical Provider, MD  clopidogrel (PLAVIX) 75 MG tablet Take 1 tablet (75 mg total) by mouth daily with breakfast. 07/10/14  Yes Almyra Deforest, PA  ferrous sulfate 325 (65 FE) MG tablet Take 325 mg by mouth 3 (three) times daily.   Yes Historical Provider, MD  furosemide (LASIX) 40 MG tablet Take 1 tablet (40 mg total) by mouth 2 (two) times daily. 10/17/14  Yes Belva Crome III, MD  ipratropium-albuterol (DUONEB) 0.5-2.5 (3) MG/3ML SOLN Take 3 mLs by nebulization every 6 (six) hours as needed. For shortness of breath.    Yes Historical Provider, MD  isosorbide-hydrALAZINE (BIDIL) 20-37.5 MG per tablet Take 1 tablet by mouth 3 (three) times daily. 06/25/14  Yes Liliane Shi, PA-C  levothyroxine (SYNTHROID, LEVOTHROID) 100 MCG tablet Take 1 tablet (100 mcg total) by mouth daily. 11/01/14  Yes Chipper Herb, MD  losartan (COZAAR) 50 MG tablet Take 1 tablet (50 mg total) by mouth daily. 07/30/14  Yes Scott T Kathlen Mody, PA-C  lovastatin (MEVACOR) 40 MG tablet Take 1 tablet (40 mg total) by mouth at bedtime. 12/07/13  Yes Sinclair Grooms, MD  Multiple Vitamins-Minerals (MULTIVITAMINS THER. W/MINERALS) TABS Take 1 tablet by mouth daily.     Yes Historical Provider, MD  nitroGLYCERIN  (NITROSTAT) 0.4 MG SL tablet Place 0.4 mg under the tongue every 5 (five) minutes as needed for chest pain.   Yes Historical Provider, MD  omeprazole (PRILOSEC) 40 MG capsule TAKE ONE CAPSULE BY MOUTH EVERY MORNING 10/18/14  Yes Chipper Herb, MD  potassium chloride (K-DUR) 10 MEQ tablet Take 1 tablet (10 mEq total) by mouth daily. 10/09/14  Yes Belva Crome III, MD  warfarin (COUMADIN) 5 MG tablet Take 2.5-5 mg by mouth daily. Take 5 mg Monday, Tuesday, Wednesday, Friday, and Sat. Take 2.5 mg on Thursday and Sunday   Yes Historical Provider, MD  hydrALAZINE (APRESOLINE) 25 MG tablet Take 1 tablet (25 mg total) by mouth 3 (three) times daily. 10/30/14   Belva Crome III, MD  isosorbide mononitrate (IMDUR) 60 MG 24 hr tablet Take 1 tablet (60 mg total) by mouth daily. Patient not taking: Reported on 11/26/2014 10/30/14   Belva Crome III,  MD  warfarin (COUMADIN) 5 MG tablet TAKE 1 TABLET BY MOUTH EVERY DAY OR AS DIRECTED BY ANTICOAGULATION CLINIC 09/20/14   Chipper Herb, MD   Physical Exam: Filed Vitals:   11/26/14 0830 11/26/14 0930 11/26/14 0938 11/26/14 1030  BP: 195/73 193/74 193/74 185/70  Pulse: 65 82 94 68  Temp:      TempSrc:      Resp: 14 15 21 15   Height:      Weight:      SpO2: 99% 95% 98% 95%    Wt Readings from Last 3 Encounters:  11/26/14 99.791 kg (220 lb)  10/08/14 99.846 kg (220 lb 1.9 oz)  07/30/14 100.245 kg (221 lb)    General:  Appears calm and slightly uncomfortable Eyes: PERRL, normal lids, irises & conjunctiva ENT: grossly normal hearing, is membranes of his mouth are pink slightly dry Neck: no LAD, masses or thyromegaly Cardiovascular: Regularly irregular no m/r/g. No LE edema. Respiratory:  Normal respiratory effort. Breath sounds are distant with a faint end expiratory wheeze throughout. I hear no crackles no rhonchi Abdomen: Obese slightly distended sluggish bowel sounds mild diffuse tenderness particularly in the lower quadrants Skin: no rash or  induration seen on limited exam Musculoskeletal: grossly normal tone BUE/BLE Psychiatric: grossly normal mood and affect, speech fluent and appropriate Neurologic: grossly non-focal. Speech clear facial symmetry           Labs on Admission:  Basic Metabolic Panel:  Recent Labs Lab 11/26/14 0404  NA 139  K 4.3  CL 104  CO2 27  GLUCOSE 125*  BUN 32*  CREATININE 1.46*  CALCIUM 9.1   Liver Function Tests:  Recent Labs Lab 11/26/14 0404  AST 24  ALT 19  ALKPHOS 48  BILITOT 0.4  PROT 7.5  ALBUMIN 4.2    Recent Labs Lab 11/26/14 0404  LIPASE 24   No results for input(s): AMMONIA in the last 168 hours. CBC:  Recent Labs Lab 11/26/14 0404  WBC 14.6*  NEUTROABS 12.1*  HGB 10.0*  HCT 31.4*  MCV 99.1  PLT 336   Cardiac Enzymes: No results for input(s): CKTOTAL, CKMB, CKMBINDEX, TROPONINI in the last 168 hours.  BNP (last 3 results) No results for input(s): BNP in the last 8760 hours.  ProBNP (last 3 results)  Recent Labs  01/23/14 0842 06/04/14 1056  PROBNP 111.0* 147.0*    CBG: No results for input(s): GLUCAP in the last 168 hours.  Radiological Exams on Admission: Ct Abdomen Pelvis Wo Contrast  11/26/2014   CLINICAL DATA:  Upper abdominal pain radiating to the back. Concern for abdominal aortic aneurysm.  EXAM: CT ABDOMEN AND PELVIS WITHOUT CONTRAST  TECHNIQUE: Multidetector CT imaging of the abdomen and pelvis was performed following the standard protocol without IV contrast.  COMPARISON:  None.  FINDINGS: There is linear atelectasis in the right lower lobe and lingula. The heart is enlarged. Coronary artery calcifications are seen.  The abdominal aorta is normal in caliber without aneurysmal dilatation. There is dense atherosclerosis of the abdominal aorta and its branches. Dense calcification noted at the origin of the superior mesenteric artery. There is no periaortic soft tissue stranding or retroperitoneal fluid.  Probable hepatic steatosis. No  focal hepatic lesion on noncontrast exam. Gallbladder mildly distended. No frank pericholecystic inflammatory change. The spleen, pancreas, and adrenal glands are normal.  There is no hydronephrosis. Renovascular calcification seen of both renal hila, no definite renal stones. There is mild nonspecific perinephric stranding.  Stomach is distended with  ingested contents. There is a small hiatal hernia. There are no dilated or thickened bowel loops. The appendix is normal. There scattered colonic diverticular without diverticulitis. No free air, free fluid, or intra-abdominal fluid collection. Surgical clips in the anterior abdominal wall at the emboli kiss, likely from hernia repair  Urinary bladder is minimally distended. Prostate gland is normal in size with central prostatic calcifications. No pelvic free fluid. Dense atherosclerosis of the pelvic vasculature.  There is degenerative disc disease and facet arthropathy in the spine. No acute or suspicious osseous abnormality.  IMPRESSION: 1. Normal caliber abdominal aorta without aneurysm. There is dense atherosclerosis of the aorta and its branches. 2. Mildly distended gallbladder but without pericholecystic inflammatory change. If there is clinical concern for biliary pathology, consider right upper quadrant ultrasound. 3. Colonic diverticulosis without diverticulitis.   Electronically Signed   By: Jeb Levering M.D.   On: 11/26/2014 05:47   US Abdomen Limited  11/26/2014   CLINICAL DATA:  Abdominal pain  EXAM: US ABDOMEN LIMITED - RIGHT UPPER QUADRANT  COMPARISON:  11/26/2014  FINDINGS: Gallbladder:  The gallbladder is well distended. Wall thickening is noted to 4.5 mm. A negative sonographic Percell Miller sign is elicited. Echogenic material is noted within the gallbladder consistent with tumefactive sludge. No definitive gallstones are seen.  Common bile duct:  Diameter: 3.3 mm  Liver:  No focal lesion identified. Within normal limits in parenchymal  echogenicity.  IMPRESSION: Gallbladder wall thickening. Tumefactive sludge is noted. In the appropriate clinical setting, these changes could represent acute cholecystitis.  No other focal abnormality is noted.   Electronically Signed   By: Inez Catalina M.D.   On: 11/26/2014 10:14   Ct Cta Abd/pel W/cm &/or W/o Cm  11/26/2014   CLINICAL DATA:  Abdominal pain radiating to both sides and into upper back. History of esophageal reflux. Hiatal hernia. Coronary artery disease.  EXAM: CTA ABDOMEN AND PELVIS wITHOUT AND WITH CONTRAST  TECHNIQUE: Multidetector CT imaging of the abdomen and pelvis was performed using the standard protocol during bolus administration of intravenous contrast. Multiplanar reconstructed images and MIPs were obtained and reviewed to evaluate the vascular anatomy.  CONTRAST:  52mL OMNIPAQUE IOHEXOL 350 MG/ML SOLN  COMPARISON:  Stone study of earlier today.  FINDINGS: Lower chest: Bibasilar atelectasis and/or scarring. Mild cardiomegaly with prior median sternotomy. No pericardial or pleural effusion.  Hepatobiliary: No focal liver lesion. Suggestion of hyperemia adjacent the gallbladder, including on image 36 of series 4.  The gallbladder is mildly distended with subtle/possible pericholecystic edema. No calcified stones. No biliary ductal dilatation.  Pancreas: Normal, without mass or ductal dilatation.  Spleen: Normal  Adrenals/Urinary Tract: Normal adrenal glands. Mildly atrophic left kidney, without hydronephrosis or hydroureter. Normal appearance of the right kidney for age with renal vascular calcifications. Normal urinary bladder.  Stomach/Bowel: Normal stomach, without wall thickening. Normal colon, appendix, and terminal ileum. Normal small bowel.  Vascular/Lymphatic: Abdominal aorta is normal in caliber, without evidence of dissection. No significant stenosis at the celiac origin. There is mild to moderate atherosclerotic irregularity at the origin of the superior mesenteric artery.  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 There is an accessory lower pole left renal artery which is relatively diminutive.  The right renal artery demonstrates mild-to-moderate atherosclerotic narrowing at its origin. Inferior mesenteric artery is patent, as are both internal iliac arteries. Significant atherosclerotic irregularity involves the external iliac arteries and common femoral arteries bilaterally.  No abdominopelvic adenopathy.  Reproductive: Mild prostatomegaly.  Other: No significant free fluid. Prior ventral abdominal wall hernia repair.  Musculoskeletal: Advanced spondylosis, without acute osseous abnormality.  Review of the MIP images confirms the above findings.  IMPRESSION: 1. Extensive atherosclerotic irregularity throughout the aorta, without evidence of dissection. 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 2. Mild gallbladder distention with suspicion of mild pericholecystic edema. Acute cholecystitis cannot be excluded. Consider abdominal ultrasound. 3. No other explanation for patient's symptoms.   Electronically Signed   By: Abigail Miyamoto M.D.   On: 11/26/2014 07:41    EKG:   Assessment/Plan Principal Problem:   Abdominal pain: Etiology not completely clear concern for acute cholecystitis. Improvement with pain medicine in the emergency department. CT and ultrasound as noted above. Will request GI consult. General surgery is already been consulted and will follow. Provide clear liquid diet and supportive therapy in the form of antiemetic and analgesics. Continue Zosyn per general surgery request. Active Problems: Acute renal failure: Likely related to decreased by mouth intake secondary to #1. Will hold any nephrotoxins. Monitor urine output. Will provide gentle IV fluids. Will recheck in the morning  Atrial fibrillation, chronic: Currently rate controlled. He is on Coumadin  with an INR of 1.92 on admission.     Iron deficiency anemia: Appears to be stable at baseline. Reportedly he is scheduled for colonoscopy in 3 days. He is on iron. Signs symptoms of active bleeding    Essential hypertension: Systolic on the higher side of normal. Home medications include Lasix, BiDil, Cozaar. Will hold Lasix and Cozaar secondary to #2. Will monitor closely    COPD (chronic obstructive pulmonary disease) with emphysema: Appears to be stable at baseline. CT NGO abdomen pelvis shows by basilar atelectasis with mild cardiomegaly. He wears oxygen at home at night only however he does report dyspnea with exertion with ADLs. Will continue his home nebulizers.    GERD: Stable at baseline continue PPI    Chronic systolic CHF (congestive heart failure): Echo January 2016 shows an EF of 55%. He is on Lasix and Cozaar. Will hold Lasix for now secondary to #2. Will obtain daily weights and monitor intake and output. Currently compensated    Obesity: BMI 31.6. Nutritional consult  CAD: Status post inferior MI in 1998 treated with PCI and subsequent CABG. status post stents October 2015. No chest pain. Monitor on telemetry.       ED physician note indicates discussed with Dr. Arnoldo Morale of general surgery    Code Status: full DVT Prophylaxis: Family Communication: wife at bedside Disposition Plan: home when ready  Time spent: 29 minutes  [facility] Pager [phone]

## 2014-11-26 NOTE — ED Notes (Signed)
Pt c/o abdominal pain, shoulder pain and upper back pain that started last night at around 8pm

## 2014-11-26 NOTE — ED Provider Notes (Signed)
CSN: [id number]     Arrival date & time 11/26/14  [id number] History   First MD Initiated Contact with Patient 11/26/14 0425     Chief Complaint  Patient presents with  . Abdominal Pain     (Consider location/radiation/quality/duration/timing/severity/associated sxs/prior Treatment) HPI  Patient reports about 8 PM tonight he started having diffuse abdominal pain that radiates into the sides. He states the pain is constant and aching. He denies nausea, vomiting, diarrhea, or difficulty urinating including dysuria or frequency. He states his last bowel movement was last night it was hard. He states he's had this pain before but not as bad as tonight. He reports he has been having a drop in his hemoglobin and he is scheduled to have a colonoscopy in 3 days. He states he had the same thing happened 2 years ago was placed on iron. He has had he stools checked by his PCP and has had negative blood 3. He is on Coumadin.  PCP Dr Laurance Flatten GI Dr Hilarie Fredrickson  Past Medical History  Diagnosis Date  . Unspecified essential hypertension   . Anemia   . Asthma   . Hepatomegaly   . Esophageal reflux   . Other and unspecified coagulation defects   . Hiatal hernia   . Personal history of colonic polyps 05/29/2010    TUBULAR ADENOMA  . CAD (coronary artery disease)   . Hypothyroidism   . Gout   . Hyperlipidemia   . Gastric antral vascular ectasia 2013  . Atrial fibrillation   . History of blood transfusion ~ 2012    "blood count dropped; had to get 3 units"  . Arthritis     "hips; back" (07/09/2014)  . On home oxygen therapy     "2L at night" (07/09/2014)  . COPD (chronic obstructive pulmonary disease)    Past Surgical History  Procedure Laterality Date  . Umbilical hernia repair    . Coronary angioplasty with stent placement  ~ 2008; 07/08/2014    "1 + 3"  . 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 Hernia repair    . Umbilical hernia repair  1970's  . Left and right heart catheterization with  coronary/graft angiogram N/A 07/09/2014    Procedure: LEFT AND RIGHT HEART CATHETERIZATION WITH Beatrix Fetters;  Surgeon: Sinclair Grooms, MD;  Location: [facility] CATH LAB;  Service: Cardiovascular;  Laterality: N/A;   Family History  Problem Relation Age of Onset  . Leukemia Mother   . Kidney disease Mother     kidney removed   . Colon cancer Neg Hx   . Heart attack Brother   . Hypertension      family   . Cancer Father   . Heart disease Father   . Stomach cancer Sister   . Stomach cancer Sister   . Early death Brother    History  Substance Use Topics  . Smoking status: Former Smoker -- 1.00 packs/day for 42 years    Types: Cigarettes    Quit date: 12/16/1996  . Smokeless tobacco: Never Used  . Alcohol Use: 0.6 oz/week    1 Cans of beer per week     Comment: 07/09/2014 "averages < 1 beer/wk"  lives at home Lives with spouse  Review of Systems  All other systems reviewed and are negative.     Allergies  Review of patient's allergies indicates no known allergies.  Home Medications   Prior to Admission medications   Medication Sig Start  Date End Date Taking? Authorizing Provider  acetaminophen (TYLENOL) 500 MG tablet Take 1,000 mg by mouth every 6 (six) hours as needed. For pain.     Historical Provider, MD  albuterol (PROVENTIL HFA;VENTOLIN HFA) 108 (90 BASE) MCG/ACT inhaler Inhale 2 puffs into the lungs every 6 (six) hours as needed. For shortness of breath. 11/22/14   Chipper Herb, MD  aspirin EC 81 MG tablet Take 1 tablet (81 mg total) by mouth every Monday, Wednesday, and Friday. 10/08/14   Belva Crome III, MD  budesonide (PULMICORT) 0.25 MG/2ML nebulizer solution Take 0.25 mg by nebulization 2 (two) times daily.     Historical Provider, MD  Cholecalciferol (VITAMIN D3) 5000 UNITS CAPS Take 5,000 Units by mouth daily.     Historical Provider, MD  clopidogrel (PLAVIX) 75 MG tablet Take 1 tablet (75 mg total) by mouth daily with breakfast. 07/10/14   Almyra Deforest, PA    ferrous sulfate 325 (65 FE) MG tablet Take 325 mg by mouth 3 (three) times daily.    Historical Provider, MD  furosemide (LASIX) 40 MG tablet Take 1 tablet (40 mg total) by mouth 2 (two) times daily. 10/17/14   Belva Crome III, MD  hydrALAZINE (APRESOLINE) 25 MG tablet Take 1 tablet (25 mg total) by mouth 3 (three) times daily. 10/30/14   Belva Crome III, MD  ipratropium-albuterol (DUONEB) 0.5-2.5 (3) MG/3ML SOLN Take 3 mLs by nebulization every 6 (six) hours as needed. For shortness of breath.     Historical Provider, MD  isosorbide mononitrate (IMDUR) 60 MG 24 hr tablet Take 1 tablet (60 mg total) by mouth daily. 10/30/14   Belva Crome III, MD  isosorbide-hydrALAZINE (BIDIL) 20-37.5 MG per tablet Take 1 tablet by mouth 3 (three) times daily. 06/25/14   Liliane Shi, PA-C  levothyroxine (SYNTHROID, LEVOTHROID) 100 MCG tablet Take 1 tablet (100 mcg total) by mouth daily. 11/01/14   Chipper Herb, MD  losartan (COZAAR) 50 MG tablet Take 1 tablet (50 mg total) by mouth daily. 07/30/14   Liliane Shi, PA-C  lovastatin (MEVACOR) 40 MG tablet Take 1 tablet (40 mg total) by mouth at bedtime. 12/07/13   Sinclair Grooms, MD  Multiple Vitamins-Minerals (MULTIVITAMINS THER. W/MINERALS) TABS Take 1 tablet by mouth daily.      Historical Provider, MD  nitroGLYCERIN (NITROSTAT) 0.4 MG SL tablet Place 0.4 mg under the tongue every 5 (five) minutes as needed for chest pain.    Historical Provider, MD  omeprazole (PRILOSEC) 40 MG capsule Take 40 mg by mouth daily.    Historical Provider, MD  omeprazole (PRILOSEC) 40 MG capsule TAKE ONE CAPSULE BY MOUTH EVERY MORNING 10/18/14   Chipper Herb, MD  potassium chloride (K-DUR) 10 MEQ tablet Take 1 tablet (10 mEq total) by mouth daily. 10/09/14   Belva Crome III, MD  warfarin (COUMADIN) 5 MG tablet TAKE 1 TABLET BY MOUTH EVERY DAY OR AS DIRECTED BY ANTICOAGULATION CLINIC 09/20/14   Chipper Herb, MD   BP 199/72 mmHg  Pulse 67  Temp(Src) 97.6 F (36.4 C) (Oral)   Resp 20  Ht 5\' 10"  (1.778 m)  Wt 220 lb (99.791 kg)  BMI 31.57 kg/m2  SpO2 96%  Vital signs normal except hypertension  Physical Exam  Constitutional: He is oriented to person, place, and time. He appears well-developed and well-nourished.  Non-toxic appearance. He does not appear ill. He appears distressed.  HENT:  Head: Normocephalic and atraumatic.  Right Ear: External ear normal.  Left Ear: External ear normal.  Nose: Nose normal. No mucosal edema or rhinorrhea.  Mouth/Throat: Oropharynx is clear and moist and mucous membranes are normal. No dental abscesses or uvula swelling.  Eyes: Conjunctivae and EOM are normal. Pupils are equal, round, and reactive to light.  Neck: Normal range of motion and full passive range of motion without pain. Neck supple.  Cardiovascular: Normal rate, regular rhythm and normal heart sounds.  Exam reveals no gallop and no friction rub.   No murmur heard. Pulmonary/Chest: Effort normal and breath sounds normal. No respiratory distress. He has no wheezes. He has no rhonchi. He has no rales. He exhibits no tenderness and no crepitus.  Abdominal: Soft. Normal appearance and bowel sounds are normal. He exhibits no distension. There is tenderness. There is no rebound and no guarding.    Patient has tenderness in 3 distinct areas.  Musculoskeletal: Normal range of motion. He exhibits no edema or tenderness.  Moves all extremities well.   Neurological: He is alert and oriented to person, place, and time. He has normal strength. No cranial nerve deficit.  Skin: Skin is warm, dry and intact. No rash noted. No erythema. No pallor.  Psychiatric: He has a normal mood and affect. His speech is normal and behavior is normal. His mood appears not anxious.  Nursing note and vitals reviewed.   ED Course  Procedures (including critical care time)  Medications  0.9 %  sodium chloride infusion ( Intravenous New Bag/Given 11/26/14 0414)  0.9 %  sodium chloride  infusion ( Intravenous Duplicate [id number])  fentaNYL (SUBLIMAZE) injection 50 mcg (50 mcg Intravenous Given 11/26/14 0521)  ondansetron (ZOFRAN) injection 4 mg (4 mg Intravenous Given 11/26/14 0521)  HYDROmorphone (DILAUDID) injection 1 mg (1 mg Intravenous Given 11/26/14 0553)  iohexol (OMNIPAQUE) 350 MG/ML injection 100 mL (80 mLs Intravenous Contrast Given 11/26/14 0648)    PT continues to c/o pain. CT scans have been nondiagnostic. Will proceed with [place].   08:19 Pt left at change of shift with Dr Thurnell Garbe to get [place] results.   Labs Review Results for orders placed or performed during the hospital encounter of 11/26/14  Urinalysis, Routine w reflex microscopic  Result Value Ref Range   Color, Urine YELLOW YELLOW   APPearance CLEAR CLEAR   Specific Gravity, Urine 1.020 1.005 - 1.030   pH 6.0 5.0 - 8.0   Glucose, UA NEGATIVE NEGATIVE mg/dL   Hgb urine dipstick NEGATIVE NEGATIVE   Bilirubin Urine NEGATIVE NEGATIVE   Ketones, ur NEGATIVE NEGATIVE mg/dL   Protein, ur 30 (A) NEGATIVE mg/dL   Urobilinogen, UA 0.2 0.0 - 1.0 mg/dL   Nitrite NEGATIVE NEGATIVE   Leukocytes, UA NEGATIVE NEGATIVE  Comprehensive metabolic panel  Result Value Ref Range   Sodium 139 135 - 145 mmol/L   Potassium 4.3 3.5 - 5.1 mmol/L   Chloride 104 96 - 112 mmol/L   CO2 27 19 - 32 mmol/L   Glucose, Bld 125 (H) 70 - 99 mg/dL   BUN 32 (H) 6 - 23 mg/dL   Creatinine, Ser 1.46 (H) 0.50 - 1.35 mg/dL   Calcium 9.1 8.4 - 10.5 mg/dL   Total Protein 7.5 6.0 - 8.3 g/dL   Albumin 4.2 3.5 - 5.2 g/dL   AST 24 0 - 37 U/L   ALT 19 0 - 53 U/L   Alkaline Phosphatase 48 39 - 117 U/L   Total Bilirubin 0.4 0.3 - 1.2 mg/dL   GFR  calc non Af Amer 45 (L) >90 mL/min   GFR calc Af Amer 52 (L) >90 mL/min   Anion gap 8 5 - 15  CBC with Differential  Result Value Ref Range   WBC 14.6 (H) 4.0 - 10.5 K/uL   RBC 3.17 (L) 4.22 - 5.81 MIL/uL   Hemoglobin 10.0 (L) 13.0 - 17.0 g/dL   HCT 31.4 (L) 39.0 - 52.0 %   MCV 99.1 78.0 - 100.0  fL   MCH 31.5 26.0 - 34.0 pg   MCHC 31.8 30.0 - 36.0 g/dL   RDW 14.6 11.5 - 15.5 %   Platelets 336 150 - 400 K/uL   Neutrophils Relative % 83 (H) 43 - 77 %   Neutro Abs 12.1 (H) 1.7 - 7.7 K/uL   Lymphocytes Relative 8 (L) 12 - 46 %   Lymphs Abs 1.1 0.7 - 4.0 K/uL   Monocytes Relative 7 3 - 12 %   Monocytes Absolute 1.1 (H) 0.1 - 1.0 K/uL   Eosinophils Relative 2 0 - 5 %   Eosinophils Absolute 0.3 0.0 - 0.7 K/uL   Basophils Relative 0 0 - 1 %   Basophils Absolute 0.0 0.0 - 0.1 K/uL  Lipase, blood  Result Value Ref Range   Lipase 24 11 - 59 U/L  Protime-INR  Result Value Ref Range   Prothrombin Time 22.1 (H) 11.6 - 15.2 seconds   INR 1.92 (H) 0.00 - 1.49  Urine microscopic-add on  Result Value Ref Range   RBC / HPF 0-2 <3 RBC/hpf  Lactic acid, plasma  Result Value Ref Range   Lactic Acid, Venous 0.7 0.5 - 2.0 mmol/L   Laboratory interpretation all normal except anemia, renal insufficiency, leukocytosis, subtherapeutic INR     Imaging Review Ct Abdomen Pelvis Wo Contrast  11/26/2014   CLINICAL DATA:  Upper abdominal pain radiating to the back. Concern for abdominal aortic aneurysm.  EXAM: CT ABDOMEN AND PELVIS WITHOUT CONTRAST  TECHNIQUE: Multidetector CT imaging of the abdomen and pelvis was performed following the standard protocol without IV contrast.  COMPARISON:  None.  FINDINGS: There is linear atelectasis in the right lower lobe and lingula. The heart is enlarged. Coronary artery calcifications are seen.  The abdominal aorta is normal in caliber without aneurysmal dilatation. There is dense atherosclerosis of the abdominal aorta and its branches. Dense calcification noted at the origin of the superior mesenteric artery. There is no periaortic soft tissue stranding or retroperitoneal fluid.  Probable hepatic steatosis. No focal hepatic lesion on noncontrast exam. Gallbladder mildly distended. No frank pericholecystic inflammatory change. The spleen, pancreas, and adrenal glands  are normal.  There is no hydronephrosis. Renovascular calcification seen of both renal hila, no definite renal stones. There is mild nonspecific perinephric stranding.  Stomach is distended with ingested contents. There is a small hiatal hernia. There are no dilated or thickened bowel loops. The appendix is normal. There scattered colonic diverticular without diverticulitis. No free air, free fluid, or intra-abdominal fluid collection. Surgical clips in the anterior abdominal wall at the emboli kiss, likely from hernia repair  Urinary bladder is minimally distended. Prostate gland is normal in size with central prostatic calcifications. No pelvic free fluid. Dense atherosclerosis of the pelvic vasculature.  There is degenerative disc disease and facet arthropathy in the spine. No acute or suspicious osseous abnormality.  IMPRESSION: 1. Normal caliber abdominal aorta without aneurysm. There is dense atherosclerosis of the aorta and its branches. 2. Mildly distended gallbladder but without pericholecystic inflammatory change.  If there is clinical concern for biliary pathology, consider right upper quadrant ultrasound. 3. Colonic diverticulosis without diverticulitis.   Electronically Signed   By: Jeb Levering M.D.   On: 11/26/2014 05:47   Ct Cta Abd/pel W/cm &/or W/o Cm  11/26/2014   CLINICAL DATA:  Abdominal pain radiating to both sides and into upper back. History of esophageal reflux. Hiatal hernia. Coronary artery disease.  EXAM: CTA ABDOMEN AND PELVIS wITHOUT AND WITH CONTRAST  TECHNIQUE: Multidetector CT imaging of the abdomen and pelvis was performed using the standard protocol during bolus administration of intravenous contrast. Multiplanar reconstructed images and MIPs were obtained and reviewed to evaluate the vascular anatomy.  CONTRAST:  33mL OMNIPAQUE IOHEXOL 350 MG/ML SOLN  COMPARISON:  Stone study of earlier today.  FINDINGS: Lower chest: Bibasilar atelectasis and/or scarring. Mild cardiomegaly  with prior median sternotomy. No pericardial or pleural effusion.  Hepatobiliary: No focal liver lesion. Suggestion of hyperemia adjacent the gallbladder, including on image 36 of series 4.  The gallbladder is mildly distended with subtle/possible pericholecystic edema. No calcified stones. No biliary ductal dilatation.  Pancreas: Normal, without mass or ductal dilatation.  Spleen: Normal  Adrenals/Urinary Tract: Normal adrenal glands. Mildly atrophic left kidney, without hydronephrosis or hydroureter. Normal appearance of the right kidney for age with renal vascular calcifications. Normal urinary bladder.  Stomach/Bowel: Normal stomach, without wall thickening. Normal colon, appendix, and terminal ileum. Normal small bowel.  Vascular/Lymphatic: Abdominal aorta is normal in caliber, without evidence of dissection. No significant stenosis at the celiac origin. There is mild to moderate atherosclerotic irregularity at the origin of the superior mesenteric artery. 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 There is an accessory lower pole left renal artery which is relatively diminutive.  The right renal artery demonstrates mild-to-moderate atherosclerotic narrowing at its origin. Inferior mesenteric artery is patent, as are both internal iliac arteries. Significant atherosclerotic irregularity involves the external iliac arteries and common femoral arteries bilaterally.  No abdominopelvic adenopathy.  Reproductive: Mild prostatomegaly.  Other: No significant free fluid. Prior ventral abdominal wall hernia repair.  Musculoskeletal: Advanced spondylosis, without acute osseous abnormality.  Review of the MIP images confirms the above findings.  IMPRESSION: 1. Extensive atherosclerotic irregularity throughout the aorta, without evidence of dissection. 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 2. Mild gallbladder  distention with suspicion of mild pericholecystic edema. Acute cholecystitis cannot be excluded. Consider abdominal ultrasound. 3. No other explanation for patient's symptoms.   Electronically Signed   By: Abigail Miyamoto M.D.   On: 11/26/2014 07:41     EKG Interpretation None      MDM   Final diagnoses:  Diffuse abdominal pain   Disposition pending  Rolland Porter, MD, Abram Sander      Janice Norrie, MD 11/26/14 [phone]

## 2014-11-26 NOTE — ED Notes (Signed)
Pt reports abdominal pain radiating into both sides and into his upper back/shoulders since last night. Denies any N/V/D.

## 2014-11-26 NOTE — ED Notes (Signed)
Patient requesting pain medication. MD aware, no orders at present.

## 2014-11-26 NOTE — ED Provider Notes (Signed)
Pt received at sign out with [place] abd pending. 74yo M, c/o diffuse abd pain. No N/V/D, no CP/SOB. VS remain stable. BUN/Cr elevated from baseline. WBC count elevated, but pt afebrile. [place] as below. T/C to General Surgery Dr. Arnoldo Morale, case discussed, including:  HPI, pertinent PM/SHx, VS/PE, dx testing, ED course and treatment:  Agreeable to consult, requests ok for pt to take PO, start zosyn, admit to medicine. T/C to [facility] Dr. Roderic Palau, case discussed, including:  HPI, pertinent PM/SHx, VS/PE, dx testing, ED course and treatment:  Agreeable to admit, requests to write temporary orders, obtain medical bed to team 2.    US Abdomen Limited 11/26/2014   CLINICAL DATA:  Abdominal pain  EXAM: US ABDOMEN LIMITED - RIGHT UPPER QUADRANT  COMPARISON:  11/26/2014  FINDINGS: Gallbladder:  The gallbladder is well distended. Wall thickening is noted to 4.5 mm. A negative sonographic Percell Miller sign is elicited. Echogenic material is noted within the gallbladder consistent with tumefactive sludge. No definitive gallstones are seen.  Common bile duct:  Diameter: 3.3 mm  Liver:  No focal lesion identified. Within normal limits in parenchymal echogenicity.  IMPRESSION: Gallbladder wall thickening. Tumefactive sludge is noted. In the appropriate clinical setting, these changes could represent acute cholecystitis.  No other focal abnormality is noted.   Electronically Signed   By: Inez Catalina M.D.   On: 11/26/2014 10:14      Francine Graven, DO 11/26/14 1137

## 2014-11-26 NOTE — Consult Note (Signed)
Referring Provider: Dyanne Carrel, NP Primary Care Physician:  Dr. Laurance Flatten  Primary Gastroenterologist:  Dr. Sharlett Iles  Date of Admission: 11/26/14 Date of Consultation: 11/26/14  Reason for Consultation:  Abdominal pain  HPI:  [name] [name] is a 74 y.o. year old male who presented to the ED with acute onset of upper abdominal pain, symptoms occuring yesterday evening. No prior incidences. Poor historian. No associated n/v. No melena or rectal bleeding. No changes in bowel habits. Appetite has slacked off the past few weeks. Drowsy. Pain has eased off since admission with supportive measures. CT as below.    Past Medical History  Diagnosis Date  . Unspecified essential hypertension   . Anemia   . Asthma   . Hepatomegaly   . Esophageal reflux   . Other and unspecified coagulation defects   . Hiatal hernia   . Personal history of colonic polyps 05/29/2010    TUBULAR ADENOMA  . CAD (coronary artery disease)   . Hypothyroidism   . Gout   . Hyperlipidemia   . Gastric antral vascular ectasia 2013  . Atrial fibrillation   . History of blood transfusion ~ 2012    "blood count dropped; had to get 3 units"  . Arthritis     "hips; back" (07/09/2014)  . On home oxygen therapy     "2L at night" (07/09/2014)  . COPD (chronic obstructive pulmonary disease)     Past Surgical History  Procedure Laterality Date  . Umbilical hernia repair    . Coronary angioplasty with stent placement  ~ 2008; 07/08/2014    "1 + 3"  . 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 Hernia repair    . Umbilical hernia repair  1970's  . Left and right heart catheterization with coronary/graft angiogram N/A 07/09/2014    Procedure: LEFT AND RIGHT HEART CATHETERIZATION WITH Beatrix Fetters;  Surgeon: Sinclair Grooms, MD;  Location: [facility] CATH LAB;  Service: Cardiovascular;  Laterality: N/A;  . Colonoscopy    . Esophagogastroduodenoscopy      Prior to Admission medications   Medication Sig  Start Date End Date Taking? Authorizing Provider  acetaminophen (TYLENOL) 500 MG tablet Take 1,000 mg by mouth every 6 (six) hours as needed. For pain.    Yes Historical Provider, MD  albuterol (PROVENTIL HFA;VENTOLIN HFA) 108 (90 BASE) MCG/ACT inhaler Inhale 2 puffs into the lungs every 6 (six) hours as needed. For shortness of breath. 11/22/14  Yes Chipper Herb, MD  aspirin EC 81 MG tablet Take 1 tablet (81 mg total) by mouth every Monday, Wednesday, and Friday. 10/08/14  Yes Belva Crome III, MD  budesonide (PULMICORT) 0.25 MG/2ML nebulizer solution Take 0.25 mg by nebulization 2 (two) times daily.    Yes Historical Provider, MD  Cholecalciferol (VITAMIN D3) 5000 UNITS CAPS Take 5,000 Units by mouth daily.    Yes Historical Provider, MD  clopidogrel (PLAVIX) 75 MG tablet Take 1 tablet (75 mg total) by mouth daily with breakfast. 07/10/14  Yes Almyra Deforest, PA  ferrous sulfate 325 (65 FE) MG tablet Take 325 mg by mouth 3 (three) times daily.   Yes Historical Provider, MD  furosemide (LASIX) 40 MG tablet Take 1 tablet (40 mg total) by mouth 2 (two) times daily. 10/17/14  Yes Belva Crome III, MD  ipratropium-albuterol (DUONEB) 0.5-2.5 (3) MG/3ML SOLN Take 3 mLs by nebulization every 6 (six) hours as needed. For shortness of breath.  Yes Historical Provider, MD  isosorbide-hydrALAZINE (BIDIL) 20-37.5 MG per tablet Take 1 tablet by mouth 3 (three) times daily. 06/25/14  Yes Liliane Shi, PA-C  levothyroxine (SYNTHROID, LEVOTHROID) 100 MCG tablet Take 1 tablet (100 mcg total) by mouth daily. 11/01/14  Yes Chipper Herb, MD  losartan (COZAAR) 50 MG tablet Take 1 tablet (50 mg total) by mouth daily. 07/30/14  Yes Scott T Kathlen Mody, PA-C  lovastatin (MEVACOR) 40 MG tablet Take 1 tablet (40 mg total) by mouth at bedtime. 12/07/13  Yes Sinclair Grooms, MD  Multiple Vitamins-Minerals (MULTIVITAMINS THER. W/MINERALS) TABS Take 1 tablet by mouth daily.     Yes Historical Provider, MD  nitroGLYCERIN (NITROSTAT) 0.4  MG SL tablet Place 0.4 mg under the tongue every 5 (five) minutes as needed for chest pain.   Yes Historical Provider, MD  omeprazole (PRILOSEC) 40 MG capsule TAKE ONE CAPSULE BY MOUTH EVERY MORNING 10/18/14  Yes Chipper Herb, MD  potassium chloride (K-DUR) 10 MEQ tablet Take 1 tablet (10 mEq total) by mouth daily. 10/09/14  Yes Belva Crome III, MD  warfarin (COUMADIN) 5 MG tablet Take 2.5-5 mg by mouth daily. Take 5 mg Monday, Tuesday, Wednesday, Friday, and Sat. Take 2.5 mg on Thursday and Sunday   Yes Historical Provider, MD  hydrALAZINE (APRESOLINE) 25 MG tablet Take 1 tablet (25 mg total) by mouth 3 (three) times daily. 10/30/14   Belva Crome III, MD  isosorbide mononitrate (IMDUR) 60 MG 24 hr tablet Take 1 tablet (60 mg total) by mouth daily. Patient not taking: Reported on 11/26/2014 10/30/14   Belva Crome III, MD  warfarin (COUMADIN) 5 MG tablet TAKE 1 TABLET BY MOUTH EVERY DAY OR AS DIRECTED BY ANTICOAGULATION CLINIC 09/20/14   Chipper Herb, MD    Current Facility-Administered Medications  Medication Dose Route Frequency Provider Last Rate Last Dose  . 0.9 %  sodium chloride infusion   Intravenous Continuous Radene Gunning, NP 75 mL/hr at 11/26/14 1502    . acetaminophen (TYLENOL) tablet 650 mg  650 mg Oral Q6H PRN Radene Gunning, NP       Or  . acetaminophen (TYLENOL) suppository 650 mg  650 mg Rectal Q6H PRN Radene Gunning, NP      . albuterol (PROVENTIL) (2.5 MG/3ML) 0.083% nebulizer solution 3 mL  3 mL Inhalation Q6H PRN Radene Gunning, NP      . alum & mag hydroxide-simeth (MAALOX/MYLANTA) 200-200-20 MG/5ML suspension 30 mL  30 mL Oral Q6H PRN Radene Gunning, NP      . budesonide (PULMICORT) nebulizer solution 0.25 mg  0.25 mg Nebulization BID Lezlie Octave Black, NP      . ferrous sulfate tablet 325 mg  325 mg Oral TID Radene Gunning, NP   325 mg at 11/26/14 1510  . HYDROcodone-acetaminophen (NORCO/VICODIN) 5-325 MG per tablet 1-2 tablet  1-2 tablet Oral Q4H PRN Radene Gunning, NP      .  HYDROmorphone (DILAUDID) injection 1 mg  1 mg Intravenous Q3H PRN Radene Gunning, NP   1 mg at 11/26/14 1459  . ipratropium-albuterol (DUONEB) 0.5-2.5 (3) MG/3ML nebulizer solution 3 mL  3 mL Nebulization Q6H PRN Radene Gunning, NP   3 mL at 11/26/14 1511  . isosorbide-hydrALAZINE (BIDIL) 20-37.5 MG per tablet 1 tablet  1 tablet Oral TID Radene Gunning, NP      . Derrill Memo ON 11/27/2014] levothyroxine (SYNTHROID, LEVOTHROID) tablet 100 mcg  100  mcg Oral QAC breakfast Radene Gunning, NP      . ondansetron [facility]) tablet 4 mg  4 mg Oral Q6H PRN Radene Gunning, NP       Or  . ondansetron [facility]) injection 4 mg  4 mg Intravenous Q6H PRN Radene Gunning, NP      . pantoprazole (PROTONIX) EC tablet 40 mg  40 mg Oral Daily Radene Gunning, NP   40 mg at 11/26/14 1458  . piperacillin-tazobactam (ZOSYN) IVPB 3.375 g  3.375 g Intravenous Q8H Kathie Dike, MD      . pravastatin (PRAVACHOL) tablet 10 mg  10 mg Oral q1800 Lezlie Octave Black, NP      . senna [facility]) tablet 8.6 mg  1 tablet Oral BID Lezlie Octave Black, NP   8.6 mg at 11/26/14 1458  . sodium chloride 0.9 % injection 3 mL  3 mL Intravenous Q12H Radene Gunning, NP   3 mL at 11/26/14 1458  . traZODone (DESYREL) tablet 25 mg  25 mg Oral QHS PRN Radene Gunning, NP        Allergies as of 11/26/2014  . (No Known Allergies)    Family History  Problem Relation Age of Onset  . Leukemia Mother   . Kidney disease Mother     kidney removed   . Colon cancer Neg Hx   . Heart attack Brother   . Hypertension      family   . Cancer Father   . Heart disease Father   . Stomach cancer Sister   . Stomach cancer Sister   . Early death Brother     History   Social History  . Marital Status: Married    Spouse Name: N/A  . Number of Children: N/A  . Years of Education: N/A   Occupational History  . retired    Social History Main Topics  . Smoking status: Former Smoker -- 1.00 packs/day for 42 years    Types: Cigarettes    Quit date: 12/16/1996  . Smokeless  tobacco: Never Used  . Alcohol Use: 0.6 oz/week    1 Cans of beer per week     Comment: 07/09/2014 "averages < 1 beer/wk", 11/26/14: denies ETOH  . Drug Use: No  . Sexual Activity: No   Other Topics Concern  . Not on file   Social History Narrative    Review of Systems: Gen: see HPI CV: Denies chest pain, heart palpitations, syncope, edema  Resp: +DOE GI: see HPI GU : Denies urinary burning, urinary frequency, urinary incontinence.  MS: Denies joint pain,swelling, cramping Derm: Denies rash, itching, dry skin Psych: Denies depression, anxiety,confusion, or memory loss Heme: Denies bruising, bleeding, and enlarged lymph nodes.  Physical Exam: Vital signs in last 24 hours: Temp:  [97.6 F (36.4 C)] 97.6 F (36.4 C) (02/23 0401) Pulse Rate:  [60-94] 69 (02/23 1300) Resp:  [14-21] 15 (02/23 1300) BP: (162-206)/(54-74) 206/66 mmHg (02/23 1300) SpO2:  [92 %-100 %] 93 % (02/23 1512) Weight:  [220 lb (99.791 kg)-220 lb 3.2 oz (99.882 kg)] 220 lb 3.2 oz (99.882 kg) (02/23 1410) Last BM Date: 11/25/14 General:   Alert,  Well-developed, well-nourished, pleasant and cooperative. Drowsy.  Head:  Normocephalic and atraumatic. Eyes:  Sclera clear, no icterus.   Conjunctiva pink. Ears:  Normal auditory acuity. Nose:  No deformity, discharge,  or lesions. Lungs:  Clear throughout to auscultation.   No wheezes, crackles, or rhonchi. No acute distress. Heart:  S1 S2  present, no murmur appreciated.  Abdomen:  Soft, TTP upper abdomen and nondistended. Obese, difficult to appreciate HSM due to large AP diameter Rectal:  Deferred until time of colonoscopy.   Msk:  Symmetrical without gross deformities. Normal posture. Extremities:  Without clubbing or edema. Neurologic:  Alert and  oriented x4;  grossly normal neurologically. Psych:  Alert and cooperative. Normal mood and affect.  Intake/Output from previous day:   Intake/Output this shift:    Lab Results:  Recent Labs  11/26/14 0404   WBC 14.6*  HGB 10.0*  HCT 31.4*  PLT 336   BMET  Recent Labs  11/26/14 0404  NA 139  K 4.3  CL 104  CO2 27  GLUCOSE 125*  BUN 32*  CREATININE 1.46*  CALCIUM 9.1   LFT  Recent Labs  11/26/14 0404  PROT 7.5  ALBUMIN 4.2  AST 24  ALT 19  ALKPHOS 48  BILITOT 0.4   PT/INR  Recent Labs  11/26/14 0404  LABPROT 22.1*  INR 1.92*    Studies/Results: Ct Abdomen Pelvis Wo Contrast  11/26/2014   CLINICAL DATA:  Upper abdominal pain radiating to the back. Concern for abdominal aortic aneurysm.  EXAM: CT ABDOMEN AND PELVIS WITHOUT CONTRAST  TECHNIQUE: Multidetector CT imaging of the abdomen and pelvis was performed following the standard protocol without IV contrast.  COMPARISON:  None.  FINDINGS: There is linear atelectasis in the right lower lobe and lingula. The heart is enlarged. Coronary artery calcifications are seen.  The abdominal aorta is normal in caliber without aneurysmal dilatation. There is dense atherosclerosis of the abdominal aorta and its branches. Dense calcification noted at the origin of the superior mesenteric artery. There is no periaortic soft tissue stranding or retroperitoneal fluid.  Probable hepatic steatosis. No focal hepatic lesion on noncontrast exam. Gallbladder mildly distended. No frank pericholecystic inflammatory change. The spleen, pancreas, and adrenal glands are normal.  There is no hydronephrosis. Renovascular calcification seen of both renal hila, no definite renal stones. There is mild nonspecific perinephric stranding.  Stomach is distended with ingested contents. There is a small hiatal hernia. There are no dilated or thickened bowel loops. The appendix is normal. There scattered colonic diverticular without diverticulitis. No free air, free fluid, or intra-abdominal fluid collection. Surgical clips in the anterior abdominal wall at the emboli kiss, likely from hernia repair  Urinary bladder is minimally distended. Prostate gland is normal in  size with central prostatic calcifications. No pelvic free fluid. Dense atherosclerosis of the pelvic vasculature.  There is degenerative disc disease and facet arthropathy in the spine. No acute or suspicious osseous abnormality.  IMPRESSION: 1. Normal caliber abdominal aorta without aneurysm. There is dense atherosclerosis of the aorta and its branches. 2. Mildly distended gallbladder but without pericholecystic inflammatory change. If there is clinical concern for biliary pathology, consider right upper quadrant ultrasound. 3. Colonic diverticulosis without diverticulitis.   Electronically Signed   By: Jeb Levering M.D.   On: 11/26/2014 05:47   US Abdomen Limited  11/26/2014   CLINICAL DATA:  Abdominal pain  EXAM: US ABDOMEN LIMITED - RIGHT UPPER QUADRANT  COMPARISON:  11/26/2014  FINDINGS: Gallbladder:  The gallbladder is well distended. Wall thickening is noted to 4.5 mm. A negative sonographic Percell Miller sign is elicited. Echogenic material is noted within the gallbladder consistent with tumefactive sludge. No definitive gallstones are seen.  Common bile duct:  Diameter: 3.3 mm  Liver:  No focal lesion identified. Within normal limits in parenchymal echogenicity.  IMPRESSION: Gallbladder  wall thickening. Tumefactive sludge is noted. In the appropriate clinical setting, these changes could represent acute cholecystitis.  No other focal abnormality is noted.   Electronically Signed   By: Inez Catalina M.D.   On: 11/26/2014 10:14   Ct Cta Abd/pel W/cm &/or W/o Cm  11/26/2014   CLINICAL DATA:  Abdominal pain radiating to both sides and into upper back. History of esophageal reflux. Hiatal hernia. Coronary artery disease.  EXAM: CTA ABDOMEN AND PELVIS wITHOUT AND WITH CONTRAST  TECHNIQUE: Multidetector CT imaging of the abdomen and pelvis was performed using the standard protocol during bolus administration of intravenous contrast. Multiplanar reconstructed images and MIPs were obtained and reviewed to  evaluate the vascular anatomy.  CONTRAST:  34mL OMNIPAQUE IOHEXOL 350 MG/ML SOLN  COMPARISON:  Stone study of earlier today.  FINDINGS: Lower chest: Bibasilar atelectasis and/or scarring. Mild cardiomegaly with prior median sternotomy. No pericardial or pleural effusion.  Hepatobiliary: No focal liver lesion. Suggestion of hyperemia adjacent the gallbladder, including on image 36 of series 4.  The gallbladder is mildly distended with subtle/possible pericholecystic edema. No calcified stones. No biliary ductal dilatation.  Pancreas: Normal, without mass or ductal dilatation.  Spleen: Normal  Adrenals/Urinary Tract: Normal adrenal glands. Mildly atrophic left kidney, without hydronephrosis or hydroureter. Normal appearance of the right kidney for age with renal vascular calcifications. Normal urinary bladder.  Stomach/Bowel: Normal stomach, without wall thickening. Normal colon, appendix, and terminal ileum. Normal small bowel.  Vascular/Lymphatic: Abdominal aorta is normal in caliber, without evidence of dissection. No significant stenosis at the celiac origin. There is mild to moderate atherosclerotic irregularity at the origin of the superior mesenteric artery. 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 There is an accessory lower pole left renal artery which is relatively diminutive.  The right renal artery demonstrates mild-to-moderate atherosclerotic narrowing at its origin. Inferior mesenteric artery is patent, as are both internal iliac arteries. Significant atherosclerotic irregularity involves the external iliac arteries and common femoral arteries bilaterally.  No abdominopelvic adenopathy.  Reproductive: Mild prostatomegaly.  Other: No significant free fluid. Prior ventral abdominal wall hernia repair.  Musculoskeletal: Advanced spondylosis, without acute osseous abnormality.  Review of the MIP images confirms the above findings.  IMPRESSION: 1. Extensive  atherosclerotic irregularity throughout the aorta, without evidence of dissection. 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 2. Mild gallbladder distention with suspicion of mild pericholecystic edema. Acute cholecystitis cannot be excluded. Consider abdominal ultrasound. 3. No other explanation for patient's symptoms.   Electronically Signed   By: Abigail Miyamoto M.D.   On: 11/26/2014 07:41    Impression: 74 year old male admitted with acute abdominal pain and CT findings raising concern for possible acute cholecystitis. Ultrasound of abdomen with sludge and gallbladder wall thickening. LFTs normal. Abdominal pain much improved from admission. As of note, scheduled for colonoscopy on Friday with Dr. Sharlett Iles as an outpatient.   Will order HIDA scan to assess further for biliary etiology. EGD on file fairly recently from 2013. Provide supportive measures for now.   Plan: PPI daily Full liquids HIDA in am If symptoms improved and tolerating diet, could discharge and follow-up as outpatient with Dr. Sharlett Iles for colonoscopy Friday.   Orvil Feil, ANP-BC [facility]     LOS: 0 days    11/26/2014, 4:26 PM

## 2014-11-26 NOTE — Progress Notes (Signed)
ANTIBIOTIC CONSULT NOTE - INITIAL  Pharmacy Consult for Zosyn Indication: intra-abdominal infection  No Known Allergies  Patient Measurements: Height: 5\' 10"  (177.8 cm) Weight: 220 lb (99.791 kg) IBW/kg (Calculated) : 73  Vital Signs: Temp: 97.6 F (36.4 C) (02/23 0401) Temp Source: Oral (02/23 0401) BP: 206/66 mmHg (02/23 1300) Pulse Rate: 69 (02/23 1300) Intake/Output from previous day:   Intake/Output from this shift:    Labs:  Recent Labs  11/26/14 0404  WBC 14.6*  HGB 10.0*  PLT 336  CREATININE 1.46*   Estimated Creatinine Clearance: 51.8 mL/min (by C-G formula based on Cr of 1.46). No results for input(s): VANCOTROUGH, VANCOPEAK, VANCORANDOM, GENTTROUGH, GENTPEAK, GENTRANDOM, TOBRATROUGH, TOBRAPEAK, TOBRARND, AMIKACINPEAK, AMIKACINTROU, AMIKACIN in the last 72 hours.   Microbiology: Recent Results (from the past 720 hour(s))  Fecal occult blood, imunochemical     Status: None   Collection Time: 10/28/14  2:53 PM  Result Value Ref Range Status   Fecal Occult Bld Negative Negative Final   Medical History: Past Medical History  Diagnosis Date  . Unspecified essential hypertension   . Anemia   . Asthma   . Hepatomegaly   . Esophageal reflux   . Other and unspecified coagulation defects   . Hiatal hernia   . Personal history of colonic polyps 05/29/2010    TUBULAR ADENOMA  . CAD (coronary artery disease)   . Hypothyroidism   . Gout   . Hyperlipidemia   . Gastric antral vascular ectasia 2013  . Atrial fibrillation   . History of blood transfusion ~ 2012    "blood count dropped; had to get 3 units"  . Arthritis     "hips; back" (07/09/2014)  . On home oxygen therapy     "2L at night" (07/09/2014)  . COPD (chronic obstructive pulmonary disease)    Anti-infectives    Start     Dose/Rate Route Frequency Ordered Stop   11/26/14 2000  piperacillin-tazobactam (ZOSYN) IVPB 3.375 g     3.375 g 12.5 mL/hr over 240 Minutes Intravenous Every 8 hours 11/26/14  1339     11/26/14 1100  piperacillin-tazobactam (ZOSYN) IVPB 3.375 g     3.375 g 100 mL/hr over 30 Minutes Intravenous STAT 11/26/14 1047 11/26/14 1140     Assessment: [name] [name] is an 74 y.o. male.  HPI: Patient is a 74 year old white male who had a sudden onset of lower abdominal pain yesterday evening. This occurred spontaneously. He had not eaten much for dinner. The abdominal pain radiates around to both flanks. He denies any right upper quadrant abdominal pain, fever, chills, or jaundice. He was scheduled undergo a routine colonoscopy in 3 days. No diarrhea or constipation abdomen noted. Asked to begin Zosyn for intra-abdominal infection.  Goal of Therapy:  Eradicate infection.  Plan:  Zosyn 3.375gm IV q8h, each dose over 4 hrs Monitor labs, progress, and cultures  Hart Robinsons A 11/26/2014,1:41 PM

## 2014-11-26 NOTE — ED Notes (Signed)
US tech at bedside

## 2014-11-26 NOTE — Progress Notes (Signed)
Patient's HR drops into 30's while sleeping but does not sustain. MD notified.

## 2014-11-27 ENCOUNTER — Inpatient Hospital Stay (HOSPITAL_COMMUNITY): Payer: Medicare Other

## 2014-11-27 ENCOUNTER — Encounter (HOSPITAL_COMMUNITY): Payer: Self-pay

## 2014-11-27 DIAGNOSIS — D509 Iron deficiency anemia, unspecified: Secondary | ICD-10-CM

## 2014-11-27 DIAGNOSIS — N179 Acute kidney failure, unspecified: Secondary | ICD-10-CM

## 2014-11-27 DIAGNOSIS — I5022 Chronic systolic (congestive) heart failure: Secondary | ICD-10-CM

## 2014-11-27 DIAGNOSIS — R1011 Right upper quadrant pain: Secondary | ICD-10-CM

## 2014-11-27 LAB — PROTIME-INR
INR: 1.77 — AB (ref 0.00–1.49)
Prothrombin Time: 20.8 seconds — ABNORMAL HIGH (ref 11.6–15.2)

## 2014-11-27 LAB — CBC
HCT: 31 % — ABNORMAL LOW (ref 39.0–52.0)
Hemoglobin: 9.8 g/dL — ABNORMAL LOW (ref 13.0–17.0)
MCH: 31.8 pg (ref 26.0–34.0)
MCHC: 31.6 g/dL (ref 30.0–36.0)
MCV: 100.6 fL — ABNORMAL HIGH (ref 78.0–100.0)
Platelets: 307 10*3/uL (ref 150–400)
RBC: 3.08 MIL/uL — ABNORMAL LOW (ref 4.22–5.81)
RDW: 15.1 % (ref 11.5–15.5)
WBC: 15.2 10*3/uL — ABNORMAL HIGH (ref 4.0–10.5)

## 2014-11-27 LAB — COMPREHENSIVE METABOLIC PANEL
ALK PHOS: 39 U/L (ref 39–117)
ALT: 16 U/L (ref 0–53)
ANION GAP: 6 (ref 5–15)
AST: 19 U/L (ref 0–37)
Albumin: 3.7 g/dL (ref 3.5–5.2)
BUN: 33 mg/dL — AB (ref 6–23)
CO2: 29 mmol/L (ref 19–32)
Calcium: 8.7 mg/dL (ref 8.4–10.5)
Chloride: 104 mmol/L (ref 96–112)
Creatinine, Ser: 1.9 mg/dL — ABNORMAL HIGH (ref 0.50–1.35)
GFR calc non Af Amer: 33 mL/min — ABNORMAL LOW (ref >90)
GFR, EST AFRICAN AMERICAN: 38 mL/min — AB (ref >90)
GLUCOSE: 106 mg/dL — AB (ref 70–99)
Potassium: 4.6 mmol/L (ref 3.5–5.1)
Sodium: 139 mmol/L (ref 135–145)
TOTAL PROTEIN: 7.1 g/dL (ref 6.0–8.3)
Total Bilirubin: 0.7 mg/dL (ref 0.3–1.2)

## 2014-11-27 MED ORDER — WARFARIN - PHARMACIST DOSING INPATIENT: Freq: Every day | Status: DC

## 2014-11-27 MED ORDER — WARFARIN SODIUM 5 MG PO TABS
5.0000 mg | ORAL_TABLET | Freq: Once | ORAL | Status: AC
  Administered 2014-11-27: 5 mg via ORAL
  Filled 2014-11-27: qty 1

## 2014-11-27 MED ORDER — SODIUM CHLORIDE 0.9 % IJ SOLN
INTRAMUSCULAR | Status: AC
  Filled 2014-11-27: qty 36

## 2014-11-27 MED ORDER — STERILE WATER FOR INJECTION IJ SOLN
INTRAMUSCULAR | Status: AC
  Administered 2014-11-27: 2 mL via INTRAVENOUS
  Filled 2014-11-27: qty 10

## 2014-11-27 MED ORDER — TECHNETIUM TC 99M MEBROFENIN IV KIT
5.0000 | PACK | Freq: Once | INTRAVENOUS | Status: AC | PRN
  Administered 2014-11-27: 5.2 via INTRAVENOUS

## 2014-11-27 MED ORDER — SINCALIDE 5 MCG IJ SOLR
INTRAMUSCULAR | Status: AC
  Administered 2014-11-27: 2 ug via INTRAVENOUS
  Filled 2014-11-27: qty 5

## 2014-11-27 MED ORDER — FUROSEMIDE 10 MG/ML IJ SOLN
40.0000 mg | Freq: Two times a day (BID) | INTRAMUSCULAR | Status: DC
  Administered 2014-11-27 – 2014-11-28 (×2): 40 mg via INTRAVENOUS
  Filled 2014-11-27 (×2): qty 4

## 2014-11-27 MED ORDER — SODIUM CHLORIDE 0.9 % IV SOLN
INTRAVENOUS | Status: DC
  Administered 2014-11-27: 16:00:00 via INTRAVENOUS

## 2014-11-27 NOTE — Care Management Note (Addendum)
    Page 1 of 1   11/28/2014     10:47:07 AM CARE MANAGEMENT NOTE 11/28/2014  Patient:  [name] [name], [name] [name]   Account Number:  [id number]  Date Initiated:  11/27/2014  Documentation initiated by:  Theophilus Kinds  Subjective/Objective Assessment:   Pt admitted from home with abd pain. Pt lives with his wife and will return home at discharge. Pt is independent with ADL's.     Action/Plan:   No CM needs noted.   Anticipated DC Date:  11/29/2014   Anticipated DC Plan:  [place]  CM consult      Choice offered to / List presented to:             Status of service:  Completed, signed off Medicare Important Message given?  NA - LOS <3 / Initial given by admissions (If response is "NO", the following Medicare IM given date fields will be blank) Date Medicare IM given:   Medicare IM given by:   Date Additional Medicare IM given:   Additional Medicare IM given by:    Discharge Disposition:  HOME/SELF CARE  Per UR Regulation:    If discussed at Long Length of Stay Meetings, dates discussed:    Comments:  11/28/14 [place], RN BSN CM Pt discharged home today. No CM needs noted.  11/27/14 [place], RN BSN CM

## 2014-11-27 NOTE — Progress Notes (Signed)
Hiatus scan results reviewed. Patient denies any biliary colic or upper abdominal symptoms. He did not have reproduction of any symptoms with CCK injection. 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 No acute surgical intervention warranted at this time. May advance diet from surgical standpoint. I will see him in follow-up as needed.

## 2014-11-27 NOTE — Progress Notes (Signed)
[facility] PROGRESS NOTE  [name] [name] [id number] DOB: 11/11/1940 DOA: 11/26/2014 PCP: [name], MD  Assessment/Plan:  Abdominal pain: resolved this am.  Etiology not completely clear concern for acute cholecystitis. Evaluated by GI and HIDA scan done today revealing biliary dyskinesia. Evaluated by general surgery who opine no surgical intervention. He is clinically improved but does have white count. No fever.  Advance diet. Discontinue Zosyn. monitor Active Problems: Acute renal failure: Likely related to decreased by mouth intake secondary to #1. Creatinine creeping up. Baseline appears to be 1.4. Will provide gentle IV fluids. Will recheck in the morning  Atrial fibrillation, chronic: Currently rate controlled. He is on Coumadin with an INR of 1.92 on admission.    Iron deficiency anemia: Appears to be stable at baseline. He is on iron. No Signs symptoms of active bleeding. Has GI appointment in [place]   Essential hypertension:controlled   COPD (chronic obstructive pulmonary disease) with emphysema: Appears to be stable at baseline. CT NGO abdomen pelvis shows by basilar atelectasis with mild cardiomegaly. He wears oxygen at home at night only however he does report dyspnea with exertion with ADLs. Will continue his home nebulizers.   GERD: Stable at baseline continue PPI   Chronic systolic CHF (congestive heart failure): Echo January 2016 shows an EF of 55%. He is on Lasix and Cozaar. Will hold Lasix for now secondary to #2. Will obtain daily weights and monitor intake and output. Currently compensated   Obesity: BMI 31.6. Nutritional consult  CAD: Status post inferior MI in 1998 treated with PCI and subsequent CABG. status post stents October 2015. No chest pain. Monitor on telemetry.    Code Status: full Family Communication: ) Disposition Plan: home hopefully tomorrow   Consultants:  GI  General surgery  Procedures:  HIDA  scan  Antibiotics:  zosyn  HPI/Subjective: Denies pain. Would like to go home  Objective: Filed Vitals:   11/27/14 1422  BP: 143/38  Pulse: 78  Temp: 99 F (37.2 C)  Resp: 18    Intake/Output Summary (Last 24 hours) at 11/27/14 1553 Last data filed at 11/27/14 1300  Gross per 24 hour  Intake    480 ml  Output    150 ml  Net    330 ml   Filed Weights   11/26/14 0401 11/26/14 1410 11/27/14 0513  Weight: 99.791 kg (220 lb) 99.882 kg (220 lb 3.2 oz) 98.6 kg (217 lb 6 oz)    Exam:   General:  Obese appears comfortable  Cardiovascular: RRR no MGR   Respiratory: normal effort BS clear  Abdomen: non-distended +BS   Musculoskeletal: no clubbing or cyanosis   Data Reviewed: Basic Metabolic Panel:  Recent Labs Lab 11/26/14 0404 11/27/14 0634  NA 139 139  K 4.3 4.6  CL 104 104  CO2 27 29  GLUCOSE 125* 106*  BUN 32* 33*  CREATININE 1.46* 1.90*  CALCIUM 9.1 8.7   Liver Function Tests:  Recent Labs Lab 11/26/14 0404 11/27/14 0634  AST 24 19  ALT 19 16  ALKPHOS 48 39  BILITOT 0.4 0.7  PROT 7.5 7.1  ALBUMIN 4.2 3.7    Recent Labs Lab 11/26/14 0404  LIPASE 24   No results for input(s): AMMONIA in the last 168 hours. CBC:  Recent Labs Lab 11/26/14 0404 11/27/14 0634  WBC 14.6* 15.2*  NEUTROABS 12.1*  --   HGB 10.0* 9.8*  HCT 31.4* 31.0*  MCV 99.1 100.6*  PLT 336 307   Cardiac Enzymes: No  results for input(s): CKTOTAL, CKMB, CKMBINDEX, TROPONINI in the last 168 hours. BNP (last 3 results) No results for input(s): BNP in the last 8760 hours.  ProBNP (last 3 results)  Recent Labs  01/23/14 0842 06/04/14 1056  PROBNP 111.0* 147.0*    CBG: No results for input(s): GLUCAP in the last 168 hours.  No results found for this or any previous visit (from the past 240 hour(s)).   Studies: Ct Abdomen Pelvis Wo Contrast  11/26/2014   CLINICAL DATA:  Upper abdominal pain radiating to the back. Concern for abdominal aortic aneurysm.  EXAM:  CT ABDOMEN AND PELVIS WITHOUT CONTRAST  TECHNIQUE: Multidetector CT imaging of the abdomen and pelvis was performed following the standard protocol without IV contrast.  COMPARISON:  None.  FINDINGS: There is linear atelectasis in the right lower lobe and lingula. The heart is enlarged. Coronary artery calcifications are seen.  The abdominal aorta is normal in caliber without aneurysmal dilatation. There is dense atherosclerosis of the abdominal aorta and its branches. Dense calcification noted at the origin of the superior mesenteric artery. There is no periaortic soft tissue stranding or retroperitoneal fluid.  Probable hepatic steatosis. No focal hepatic lesion on noncontrast exam. Gallbladder mildly distended. No frank pericholecystic inflammatory change. The spleen, pancreas, and adrenal glands are normal.  There is no hydronephrosis. Renovascular calcification seen of both renal hila, no definite renal stones. There is mild nonspecific perinephric stranding.  Stomach is distended with ingested contents. There is a small hiatal hernia. There are no dilated or thickened bowel loops. The appendix is normal. There scattered colonic diverticular without diverticulitis. No free air, free fluid, or intra-abdominal fluid collection. Surgical clips in the anterior abdominal wall at the emboli kiss, likely from hernia repair  Urinary bladder is minimally distended. Prostate gland is normal in size with central prostatic calcifications. No pelvic free fluid. Dense atherosclerosis of the pelvic vasculature.  There is degenerative disc disease and facet arthropathy in the spine. No acute or suspicious osseous abnormality.  IMPRESSION: 1. Normal caliber abdominal aorta without aneurysm. There is dense atherosclerosis of the aorta and its branches. 2. Mildly distended gallbladder but without pericholecystic inflammatory change. If there is clinical concern for biliary pathology, consider right upper quadrant ultrasound. 3.  Colonic diverticulosis without diverticulitis.   Electronically Signed   By: Jeb Levering M.D.   On: 11/26/2014 05:47   Nm Hepato W/eject Fract  11/27/2014   CLINICAL DATA:  74 year old with nausea and abdominal pain for 2 days.  EXAM: NUCLEAR MEDICINE HEPATOBILIARY IMAGING WITH GALLBLADDER EF  TECHNIQUE: Sequential images of the abdomen were obtained out to 60 minutes following intravenous administration of radiopharmaceutical. After slow intravenous infusion of micrograms Cholecystokinin, gallbladder ejection fraction was determined.  RADIOPHARMACEUTICALS:  5.2 Millicurie 123XX123 Choletec  COMPARISON:  Ultrasound 11/26/2014  FINDINGS: Radiopharmaceutical was taken up by the liver and excreted into the biliary system. There is gallbladder uptake at approximately 5 minutes. Activity is identified in the small bowel. The activity in the gallbladder continued to increase even after the CCK administration. Ejection fraction was actually -22%. At 45 min, normal ejection fraction is greater than 40%.  The patient did not experience symptoms during CCK infusion.  IMPRESSION: The gallbladder ejection fraction was less than zero. Gallbladder uptake continued to increase even after the administration of CCK. 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   Electronically Signed   By: Markus Daft M.D.   On: 11/27/2014 09:25   US Abdomen Limited  11/26/2014  CLINICAL DATA:  Abdominal pain  EXAM: US ABDOMEN LIMITED - RIGHT UPPER QUADRANT  COMPARISON:  11/26/2014  FINDINGS: Gallbladder:  The gallbladder is well distended. Wall thickening is noted to 4.5 mm. A negative sonographic Percell Miller sign is elicited. Echogenic material is noted within the gallbladder consistent with tumefactive sludge. No definitive gallstones are seen.  Common bile duct:  Diameter: 3.3 mm  Liver:  No focal lesion identified. Within normal limits in parenchymal echogenicity.  IMPRESSION: Gallbladder wall thickening. Tumefactive sludge is noted. In  the appropriate clinical setting, these changes could represent acute cholecystitis.  No other focal abnormality is noted.   Electronically Signed   By: Inez Catalina M.D.   On: 11/26/2014 10:14   Ct Cta Abd/pel W/cm &/or W/o Cm  11/26/2014   CLINICAL DATA:  Abdominal pain radiating to both sides and into upper back. History of esophageal reflux. Hiatal hernia. Coronary artery disease.  EXAM: CTA ABDOMEN AND PELVIS wITHOUT AND WITH CONTRAST  TECHNIQUE: Multidetector CT imaging of the abdomen and pelvis was performed using the standard protocol during bolus administration of intravenous contrast. Multiplanar reconstructed images and MIPs were obtained and reviewed to evaluate the vascular anatomy.  CONTRAST:  70mL OMNIPAQUE IOHEXOL 350 MG/ML SOLN  COMPARISON:  Stone study of earlier today.  FINDINGS: Lower chest: Bibasilar atelectasis and/or scarring. Mild cardiomegaly with prior median sternotomy. No pericardial or pleural effusion.  Hepatobiliary: No focal liver lesion. Suggestion of hyperemia adjacent the gallbladder, including on image 36 of series 4.  The gallbladder is mildly distended with subtle/possible pericholecystic edema. No calcified stones. No biliary ductal dilatation.  Pancreas: Normal, without mass or ductal dilatation.  Spleen: Normal  Adrenals/Urinary Tract: Normal adrenal glands. Mildly atrophic left kidney, without hydronephrosis or hydroureter. Normal appearance of the right kidney for age with renal vascular calcifications. Normal urinary bladder.  Stomach/Bowel: Normal stomach, without wall thickening. Normal colon, appendix, and terminal ileum. Normal small bowel.  Vascular/Lymphatic: Abdominal aorta is normal in caliber, without evidence of dissection. No significant stenosis at the celiac origin. There is mild to moderate atherosclerotic irregularity at the origin of the superior mesenteric artery. 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  There is an accessory lower pole left renal artery which is relatively diminutive.  The right renal artery demonstrates mild-to-moderate atherosclerotic narrowing at its origin. Inferior mesenteric artery is patent, as are both internal iliac arteries. Significant atherosclerotic irregularity involves the external iliac arteries and common femoral arteries bilaterally.  No abdominopelvic adenopathy.  Reproductive: Mild prostatomegaly.  Other: No significant free fluid. Prior ventral abdominal wall hernia repair.  Musculoskeletal: Advanced spondylosis, without acute osseous abnormality.  Review of the MIP images confirms the above findings.  IMPRESSION: 1. Extensive atherosclerotic irregularity throughout the aorta, without evidence of dissection. 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 2. Mild gallbladder distention with suspicion of mild pericholecystic edema. Acute cholecystitis cannot be excluded. Consider abdominal ultrasound. 3. No other explanation for patient's symptoms.   Electronically Signed   By: Abigail Miyamoto M.D.   On: 11/26/2014 07:41    Scheduled Meds: . budesonide  0.25 mg Nebulization BID  . ferrous sulfate  325 mg Oral TID  . ipratropium-albuterol  3 mL Nebulization BID  . levothyroxine  100 mcg Oral QAC breakfast  . pantoprazole  40 mg Oral Daily  . piperacillin-tazobactam (ZOSYN)  IV  3.375 g Intravenous Q8H  . pravastatin  10 mg  Oral q1800  . senna  1 tablet Oral BID  . sodium chloride  3 mL Intravenous Q12H  . sodium chloride       Continuous Infusions: . sodium chloride      Principal Problem:   Abdominal pain Active Problems:   Iron deficiency anemia   Essential hypertension   COPD (chronic obstructive pulmonary disease) with emphysema   GERD   Chronic systolic CHF (congestive heart failure)   Obesity   Atrial fibrillation, chronic   Acute renal failure   Left leg pain    Time spent: 35  minutes    [facility] Pager [phone]. If 7PM-7AM, please contact night-coverage at [email or web address], password [facility] 11/27/2014, 3:53 PM  LOS: 1 day

## 2014-11-27 NOTE — Progress Notes (Signed)
Subjective:  Denies abdominal pain. Feels weak. Doesn't want to keep appt this week for GI due to current illness. Hungry.  Objective: Vital signs in last 24 hours: Temp:  [98.5 F (36.9 C)-99.5 F (37.5 C)] 98.5 F (36.9 C) (02/24 0513) Pulse Rate:  [52-79] 52 (02/24 0930) Resp:  [15-20] 18 (02/24 0513) BP: (138-206)/(37-68) 138/37 mmHg (02/24 0930) SpO2:  [91 %-96 %] 94 % (02/24 0735) Weight:  [217 lb 6 oz (98.6 kg)-220 lb 3.2 oz (99.882 kg)] 217 lb 6 oz (98.6 kg) (02/24 0513) Last BM Date: 11/25/14 General:   Alert,  Well-developed, well-nourished, pleasant and cooperative in NAD Head:  Normocephalic and atraumatic. Eyes:  Sclera clear, no icterus.   Abdomen:  Soft, nontender and nondistended.  Extremities:  Without clubbing, deformity. 1+ edema. Neurologic:  Alert and  oriented x4;  grossly normal neurologically. Skin:  Intact without significant lesions or rashes. Psych:  Alert and cooperative. Normal mood and affect.  Intake/Output from previous day: 02/23 0701 - 02/24 0700 In: 240 [P.O.:240] Out: 150 [Urine:150] Intake/Output this shift:    Lab Results: CBC  Recent Labs  11/26/14 0404 11/27/14 0634  WBC 14.6* 15.2*  HGB 10.0* 9.8*  HCT 31.4* 31.0*  MCV 99.1 100.6*  PLT 336 307   BMET  Recent Labs  11/26/14 0404 11/27/14 0634  NA 139 139  K 4.3 4.6  CL 104 104  CO2 27 29  GLUCOSE 125* 106*  BUN 32* 33*  CREATININE 1.46* 1.90*  CALCIUM 9.1 8.7   LFTs  Recent Labs  11/26/14 0404 11/27/14 0634  BILITOT 0.4 0.7  ALKPHOS 48 39  AST 24 19  ALT 19 16  PROT 7.5 7.1  ALBUMIN 4.2 3.7    Recent Labs  11/26/14 0404  LIPASE 24   PT/INR  Recent Labs  11/26/14 0404 11/27/14 0634  LABPROT 22.1* 20.8*  INR 1.92* 1.77*      Imaging Studies: Ct Abdomen Pelvis Wo Contrast  11/26/2014   CLINICAL DATA:  Upper abdominal pain radiating to the back. Concern for abdominal aortic aneurysm.  EXAM: CT ABDOMEN AND PELVIS WITHOUT CONTRAST  TECHNIQUE:  Multidetector CT imaging of the abdomen and pelvis was performed following the standard protocol without IV contrast.  COMPARISON:  None.  FINDINGS: There is linear atelectasis in the right lower lobe and lingula. The heart is enlarged. Coronary artery calcifications are seen.  The abdominal aorta is normal in caliber without aneurysmal dilatation. There is dense atherosclerosis of the abdominal aorta and its branches. Dense calcification noted at the origin of the superior mesenteric artery. There is no periaortic soft tissue stranding or retroperitoneal fluid.  Probable hepatic steatosis. No focal hepatic lesion on noncontrast exam. Gallbladder mildly distended. No frank pericholecystic inflammatory change. The spleen, pancreas, and adrenal glands are normal.  There is no hydronephrosis. Renovascular calcification seen of both renal hila, no definite renal stones. There is mild nonspecific perinephric stranding.  Stomach is distended with ingested contents. There is a small hiatal hernia. There are no dilated or thickened bowel loops. The appendix is normal. There scattered colonic diverticular without diverticulitis. No free air, free fluid, or intra-abdominal fluid collection. Surgical clips in the anterior abdominal wall at the emboli kiss, likely from hernia repair  Urinary bladder is minimally distended. Prostate gland is normal in size with central prostatic calcifications. No pelvic free fluid. Dense atherosclerosis of the pelvic vasculature.  There is degenerative disc disease and facet arthropathy in the spine. No acute or suspicious osseous  abnormality.  IMPRESSION: 1. Normal caliber abdominal aorta without aneurysm. There is dense atherosclerosis of the aorta and its branches. 2. Mildly distended gallbladder but without pericholecystic inflammatory change. If there is clinical concern for biliary pathology, consider right upper quadrant ultrasound. 3. Colonic diverticulosis without diverticulitis.    Electronically Signed   By: Jeb Levering M.D.   On: 11/26/2014 05:47   Nm Hepato W/eject Fract  11/27/2014   CLINICAL DATA:  74 year old with nausea and abdominal pain for 2 days.  EXAM: NUCLEAR MEDICINE HEPATOBILIARY IMAGING WITH GALLBLADDER EF  TECHNIQUE: Sequential images of the abdomen were obtained out to 60 minutes following intravenous administration of radiopharmaceutical. After slow intravenous infusion of micrograms Cholecystokinin, gallbladder ejection fraction was determined.  RADIOPHARMACEUTICALS:  5.2 Millicurie 123XX123 Choletec  COMPARISON:  Ultrasound 11/26/2014  FINDINGS: Radiopharmaceutical was taken up by the liver and excreted into the biliary system. There is gallbladder uptake at approximately 5 minutes. Activity is identified in the small bowel. The activity in the gallbladder continued to increase even after the CCK administration. Ejection fraction was actually -22%. At 45 min, normal ejection fraction is greater than 40%.  The patient did not experience symptoms during CCK infusion.  IMPRESSION: The gallbladder ejection fraction was less than zero. Gallbladder uptake continued to increase even after the administration of CCK. 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   Electronically Signed   By: Markus Daft M.D.   On: 11/27/2014 09:25   US Abdomen Limited  11/26/2014   CLINICAL DATA:  Abdominal pain  EXAM: US ABDOMEN LIMITED - RIGHT UPPER QUADRANT  COMPARISON:  11/26/2014  FINDINGS: Gallbladder:  The gallbladder is well distended. Wall thickening is noted to 4.5 mm. A negative sonographic Percell Miller sign is elicited. Echogenic material is noted within the gallbladder consistent with tumefactive sludge. No definitive gallstones are seen.  Common bile duct:  Diameter: 3.3 mm  Liver:  No focal lesion identified. Within normal limits in parenchymal echogenicity.  IMPRESSION: Gallbladder wall thickening. Tumefactive sludge is noted. In the appropriate clinical setting, these changes  could represent acute cholecystitis.  No other focal abnormality is noted.   Electronically Signed   By: Inez Catalina M.D.   On: 11/26/2014 10:14   Ct Cta Abd/pel W/cm &/or W/o Cm  11/26/2014   CLINICAL DATA:  Abdominal pain radiating to both sides and into upper back. History of esophageal reflux. Hiatal hernia. Coronary artery disease.  EXAM: CTA ABDOMEN AND PELVIS wITHOUT AND WITH CONTRAST  TECHNIQUE: Multidetector CT imaging of the abdomen and pelvis was performed using the standard protocol during bolus administration of intravenous contrast. Multiplanar reconstructed images and MIPs were obtained and reviewed to evaluate the vascular anatomy.  CONTRAST:  34mL OMNIPAQUE IOHEXOL 350 MG/ML SOLN  COMPARISON:  Stone study of earlier today.  FINDINGS: Lower chest: Bibasilar atelectasis and/or scarring. Mild cardiomegaly with prior median sternotomy. No pericardial or pleural effusion.  Hepatobiliary: No focal liver lesion. Suggestion of hyperemia adjacent the gallbladder, including on image 36 of series 4.  The gallbladder is mildly distended with subtle/possible pericholecystic edema. No calcified stones. No biliary ductal dilatation.  Pancreas: Normal, without mass or ductal dilatation.  Spleen: Normal  Adrenals/Urinary Tract: Normal adrenal glands. Mildly atrophic left kidney, without hydronephrosis or hydroureter. Normal appearance of the right kidney for age with renal vascular calcifications. Normal urinary bladder.  Stomach/Bowel: Normal stomach, without wall thickening. Normal colon, appendix, and terminal ileum. Normal small bowel.  Vascular/Lymphatic: Abdominal aorta is normal in caliber, without evidence of dissection.  No significant stenosis at the celiac origin. There is mild to moderate atherosclerotic irregularity at the origin of the superior mesenteric artery. 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 There is an accessory lower pole left renal artery  which is relatively diminutive.  The right renal artery demonstrates mild-to-moderate atherosclerotic narrowing at its origin. Inferior mesenteric artery is patent, as are both internal iliac arteries. Significant atherosclerotic irregularity involves the external iliac arteries and common femoral arteries bilaterally.  No abdominopelvic adenopathy.  Reproductive: Mild prostatomegaly.  Other: No significant free fluid. Prior ventral abdominal wall hernia repair.  Musculoskeletal: Advanced spondylosis, without acute osseous abnormality.  Review of the MIP images confirms the above findings.  IMPRESSION: 1. Extensive atherosclerotic irregularity throughout the aorta, without evidence of dissection. 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 2. Mild gallbladder distention with suspicion of mild pericholecystic edema. Acute cholecystitis cannot be excluded. Consider abdominal ultrasound. 3. No other explanation for patient's symptoms.   Electronically Signed   By: Abigail Miyamoto M.D.   On: 11/26/2014 07:41  [2 weeks]   Assessment:  75 year old male admitted with acute abdominal pain and CT findings raising concern for possible acute cholecystitis. Ultrasound of abdomen with sludge and gallbladder wall thickening. LFTs normal. Abdominal pain much improved from admission. 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 Clinically improved.   Anemia, heme + stool. Followed at [place]. As of note, patient was scheduled for an OV Friday with Dr. Hilarie Fredrickson at [place] a colonoscopy, appointment has been cancelled as of yesterday. Unclear if cancelled by family member. 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 TCS 2013 unremarkable.  SB capsule 2013, single AVM proximal SB.      Plan: 1. PPI daily. 2. No diet restrictions needed for GI procedures as outlined above. 3. Await surgery input.  ?consider cholecystectomy  4. Patient will follow up at [place] [facility] after discharge.   Laureen Ochs. Bernarda Caffey [facility] [phone] 2/24/201611:58 AM     LOS: 1 day    Attending note:  Patient seen and examined. Discussed with Dr. Arnoldo Morale. Even with biliary dyskinesia, no need for emergent surgical intervention at his time. Let's advance to a heart healthy diet and see how he does. If he tolerates his diet, he could be discharged with follow-up care to be obtained with Dr. [facility] in [place].

## 2014-11-27 NOTE — Progress Notes (Signed)
UR chart review completed.  

## 2014-11-27 NOTE — Progress Notes (Addendum)
ANTICOAGULATION CONSULT NOTE - Initial Consult  Pharmacy Consult for Warfarin Indication: atrial fibrillation  No Known Allergies  Patient Measurements: Height: 5\' 10"  (177.8 cm) Weight: 217 lb 6 oz (98.6 kg) IBW/kg (Calculated) : 73 Heparin Dosing Weight:   Vital Signs: Temp: 99 F (37.2 C) (02/24 1422) Temp Source: Oral (02/24 1422) BP: 143/38 mmHg (02/24 1422) Pulse Rate: 78 (02/24 1422)  Labs:  Recent Labs  11/26/14 0404 11/27/14 0634  HGB 10.0* 9.8*  HCT 31.4* 31.0*  PLT 336 307  LABPROT 22.1* 20.8*  INR 1.92* 1.77*  CREATININE 1.46* 1.90*    Estimated Creatinine Clearance: 39.5 mL/min (by C-G formula based on Cr of 1.9).   Medical History: Past Medical History  Diagnosis Date  . Unspecified essential hypertension   . Anemia   . Asthma   . Hepatomegaly   . Esophageal reflux   . Other and unspecified coagulation defects   . Hiatal hernia   . Personal history of colonic polyps 05/29/2010    TUBULAR ADENOMA  . CAD (coronary artery disease)   . Hypothyroidism   . Gout   . Hyperlipidemia   . Gastric antral vascular ectasia 2013  . Atrial fibrillation   . History of blood transfusion ~ 2012    "blood count dropped; had to get 3 units"  . Arthritis     "hips; back" (07/09/2014)  . On home oxygen therapy     "2L at night" (07/09/2014)  . COPD (chronic obstructive pulmonary disease)     Medications:  Prescriptions prior to admission  Medication Sig Dispense Refill Last Dose  . acetaminophen (TYLENOL) 500 MG tablet Take 1,000 mg by mouth every 6 (six) hours as needed. For pain.    Past Week at Unknown time  . albuterol (PROVENTIL HFA;VENTOLIN HFA) 108 (90 BASE) MCG/ACT inhaler Inhale 2 puffs into the lungs every 6 (six) hours as needed. For shortness of breath. 1 Inhaler 3 unknown  . aspirin EC 81 MG tablet Take 1 tablet (81 mg total) by mouth every Monday, Wednesday, and Friday.   11/25/2014 at Unknown time  . budesonide (PULMICORT) 0.25 MG/2ML nebulizer  solution Take 0.25 mg by nebulization 2 (two) times daily.    11/26/2014 at Unknown time  . Cholecalciferol (VITAMIN D3) 5000 UNITS CAPS Take 5,000 Units by mouth daily.    11/25/2014 at Unknown time  . clopidogrel (PLAVIX) 75 MG tablet Take 1 tablet (75 mg total) by mouth daily with breakfast. 30 tablet 1 11/25/2014 at Unknown time  . ferrous sulfate 325 (65 FE) MG tablet Take 325 mg by mouth 3 (three) times daily.   11/25/2014 at Unknown time  . furosemide (LASIX) 40 MG tablet Take 1 tablet (40 mg total) by mouth 2 (two) times daily. 60 tablet 6 11/25/2014 at Unknown time  . ipratropium-albuterol (DUONEB) 0.5-2.5 (3) MG/3ML SOLN Take 3 mLs by nebulization every 6 (six) hours as needed. For shortness of breath.    11/26/2014 at Unknown time  . isosorbide-hydrALAZINE (BIDIL) 20-37.5 MG per tablet Take 1 tablet by mouth 3 (three) times daily. 90 tablet 11 11/25/2014 at Unknown time  . levothyroxine (SYNTHROID, LEVOTHROID) 100 MCG tablet Take 1 tablet (100 mcg total) by mouth daily. 90 tablet 1 11/25/2014 at Unknown time  . losartan (COZAAR) 50 MG tablet Take 1 tablet (50 mg total) by mouth daily. 90 tablet 3 11/25/2014 at Unknown time  . lovastatin (MEVACOR) 40 MG tablet Take 1 tablet (40 mg total) by mouth at bedtime. 90 tablet 2  11/25/2014 at Unknown time  . Multiple Vitamins-Minerals (MULTIVITAMINS THER. W/MINERALS) TABS Take 1 tablet by mouth daily.     11/25/2014 at Unknown time  . nitroGLYCERIN (NITROSTAT) 0.4 MG SL tablet Place 0.4 mg under the tongue every 5 (five) minutes as needed for chest pain.   unknown  . omeprazole (PRILOSEC) 40 MG capsule TAKE ONE CAPSULE BY MOUTH EVERY MORNING 90 capsule 1 11/26/2014 at Unknown time  . potassium chloride (K-DUR) 10 MEQ tablet Take 1 tablet (10 mEq total) by mouth daily. 30 tablet 5 11/25/2014 at Unknown time  . warfarin (COUMADIN) 5 MG tablet Take 2.5-5 mg by mouth daily. Take 5 mg Monday, Tuesday, Wednesday, Friday, and Sat. Take 2.5 mg on Thursday and Sunday    11/25/2014 at Unknown time  . hydrALAZINE (APRESOLINE) 25 MG tablet Take 1 tablet (25 mg total) by mouth 3 (three) times daily. 90 tablet 6   . isosorbide mononitrate (IMDUR) 60 MG 24 hr tablet Take 1 tablet (60 mg total) by mouth daily. (Patient not taking: Reported on 11/26/2014) 30 tablet 6 Not Taking at Unknown time  . warfarin (COUMADIN) 5 MG tablet TAKE 1 TABLET BY MOUTH EVERY DAY OR AS DIRECTED BY ANTICOAGULATION CLINIC 30 tablet 2 Taking   Scheduled:  . budesonide  0.25 mg Nebulization BID  . ferrous sulfate  325 mg Oral TID  . ipratropium-albuterol  3 mL Nebulization BID  . levothyroxine  100 mcg Oral QAC breakfast  . pantoprazole  40 mg Oral Daily  . pravastatin  10 mg Oral q1800  . senna  1 tablet Oral BID  . sodium chloride  3 mL Intravenous Q12H  . sodium chloride      . warfarin  5 mg Oral Once  . Warfarin - Pharmacist Dosing Inpatient   Does not apply q1800    Assessment: Warfarin PTA for chronic atrial fibrillation Admitted for abdominal pain: resolved this am. Etiology not completely clear concern for acute cholecystitis. Surgery had held warfarin on admission. Warfarin now to be continued. INR 1.77 today  Goal of Therapy:  INR 2-3 Monitor platelets by anticoagulation protocol: Yes   Plan:  Coumadin 5 mg po x 1 dose tonight INR/PT daily Monitor CBC/platelets Labs per protocol  Abner Greenspan, Sol Odor Bennett 11/27/2014,4:15 PM

## 2014-11-28 ENCOUNTER — Telehealth: Payer: Self-pay | Admitting: Family Medicine

## 2014-11-28 ENCOUNTER — Inpatient Hospital Stay (HOSPITAL_COMMUNITY): Payer: Medicare Other

## 2014-11-28 DIAGNOSIS — R1033 Periumbilical pain: Secondary | ICD-10-CM

## 2014-11-28 DIAGNOSIS — R509 Fever, unspecified: Secondary | ICD-10-CM | POA: Insufficient documentation

## 2014-11-28 LAB — CBC
HCT: 27.9 % — ABNORMAL LOW (ref 39.0–52.0)
Hemoglobin: 8.9 g/dL — ABNORMAL LOW (ref 13.0–17.0)
MCH: 31.7 pg (ref 26.0–34.0)
MCHC: 31.9 g/dL (ref 30.0–36.0)
MCV: 99.3 fL (ref 78.0–100.0)
PLATELETS: 243 10*3/uL (ref 150–400)
RBC: 2.81 MIL/uL — ABNORMAL LOW (ref 4.22–5.81)
RDW: 14.4 % (ref 11.5–15.5)
WBC: 11.4 10*3/uL — ABNORMAL HIGH (ref 4.0–10.5)

## 2014-11-28 LAB — BASIC METABOLIC PANEL
Anion gap: 6 (ref 5–15)
BUN: 33 mg/dL — AB (ref 6–23)
CHLORIDE: 103 mmol/L (ref 96–112)
CO2: 30 mmol/L (ref 19–32)
Calcium: 8.4 mg/dL (ref 8.4–10.5)
Creatinine, Ser: 1.75 mg/dL — ABNORMAL HIGH (ref 0.50–1.35)
GFR calc non Af Amer: 36 mL/min — ABNORMAL LOW (ref >90)
GFR, EST AFRICAN AMERICAN: 42 mL/min — AB (ref >90)
GLUCOSE: 100 mg/dL — AB (ref 70–99)
POTASSIUM: 4.2 mmol/L (ref 3.5–5.1)
Sodium: 139 mmol/L (ref 135–145)

## 2014-11-28 LAB — PROTIME-INR
INR: 1.76 — ABNORMAL HIGH (ref 0.00–1.49)
PROTHROMBIN TIME: 20.7 s — AB (ref 11.6–15.2)

## 2014-11-28 MED ORDER — AMOXICILLIN-POT CLAVULANATE 875-125 MG PO TABS
1.0000 | ORAL_TABLET | Freq: Two times a day (BID) | ORAL | Status: DC
  Administered 2014-11-28: 1 via ORAL
  Filled 2014-11-28: qty 1

## 2014-11-28 MED ORDER — AMOXICILLIN-POT CLAVULANATE 875-125 MG PO TABS: 1.0000 | ORAL_TABLET | Freq: Two times a day (BID) | ORAL | Status: DC

## 2014-11-28 MED ORDER — HYDROCODONE-ACETAMINOPHEN 5-325 MG PO TABS: 1.0000 | ORAL_TABLET | Freq: Four times a day (QID) | ORAL | Status: DC | PRN

## 2014-11-28 MED ORDER — LOSARTAN POTASSIUM 50 MG PO TABS: ORAL_TABLET | ORAL | Status: DC

## 2014-11-28 MED ORDER — LEVOFLOXACIN 750 MG PO TABS: 750.0000 mg | ORAL_TABLET | Freq: Every day | ORAL | Status: DC

## 2014-11-28 MED ORDER — WARFARIN SODIUM 5 MG PO TABS: 5.0000 mg | ORAL_TABLET | Freq: Once | ORAL | Status: DC

## 2014-11-28 NOTE — Progress Notes (Signed)
Subjective:  Continues to feel better. No recurrent abdominal pain. No vomiting. Wants to go home today.  Objective: Vital signs in last 24 hours: Temp:  [99 F (37.2 C)-100.1 F (37.8 C)] 100 F (37.8 C) (02/25 0617) Pulse Rate:  [52-78] 55 (02/25 0617) Resp:  [17-18] 18 (02/25 0617) BP: (138-160)/(37-52) 140/52 mmHg (02/25 0617) SpO2:  [93 %-99 %] 99 % (02/25 0723) Weight:  [215 lb 13.3 oz (97.9 kg)] 215 lb 13.3 oz (97.9 kg) (02/25 0617) Last BM Date: 11/27/14 General:   Alert,  Well-developed, well-nourished, pleasant and cooperative in NAD Head:  Normocephalic and atraumatic. Eyes:  Sclera clear, no icterus.  Abdomen:  Soft, nontender and nondistended.  Neurologic:  Alert and  oriented x4;  grossly normal neurologically. Skin:  Intact without significant lesions or rashes. Psych:  Alert and cooperative. Normal mood and affect.  Intake/Output from previous day: 02/24 0701 - 02/25 0700 In: 483 [P.O.:480; I.V.:3] Out: -  Intake/Output this shift:    Lab Results: CBC  Recent Labs  11/26/14 0404 11/27/14 0634 11/28/14 0611  WBC 14.6* 15.2* 11.4*  HGB 10.0* 9.8* 8.9*  HCT 31.4* 31.0* 27.9*  MCV 99.1 100.6* 99.3  PLT 336 307 243   BMET  Recent Labs  11/26/14 0404 11/27/14 0634 11/28/14 0611  NA 139 139 139  K 4.3 4.6 4.2  CL 104 104 103  CO2 27 29 30   GLUCOSE 125* 106* 100*  BUN 32* 33* 33*  CREATININE 1.46* 1.90* 1.75*  CALCIUM 9.1 8.7 8.4   LFTs  Recent Labs  11/26/14 0404 11/27/14 0634  BILITOT 0.4 0.7  ALKPHOS 48 39  AST 24 19  ALT 19 16  PROT 7.5 7.1  ALBUMIN 4.2 3.7    Recent Labs  11/26/14 0404  LIPASE 24   PT/INR  Recent Labs  11/26/14 0404 11/27/14 0634 11/28/14 0611  LABPROT 22.1* 20.8* 20.7*  INR 1.92* 1.77* 1.76*      Imaging Studies: Ct Abdomen Pelvis Wo Contrast  11/26/2014   CLINICAL DATA:  Upper abdominal pain radiating to the back. Concern for abdominal aortic aneurysm.  EXAM: CT ABDOMEN AND PELVIS  WITHOUT CONTRAST  TECHNIQUE: Multidetector CT imaging of the abdomen and pelvis was performed following the standard protocol without IV contrast.  COMPARISON:  None.  FINDINGS: There is linear atelectasis in the right lower lobe and lingula. The heart is enlarged. Coronary artery calcifications are seen.  The abdominal aorta is normal in caliber without aneurysmal dilatation. There is dense atherosclerosis of the abdominal aorta and its branches. Dense calcification noted at the origin of the superior mesenteric artery. There is no periaortic soft tissue stranding or retroperitoneal fluid.  Probable hepatic steatosis. No focal hepatic lesion on noncontrast exam. Gallbladder mildly distended. No frank pericholecystic inflammatory change. The spleen, pancreas, and adrenal glands are normal.  There is no hydronephrosis. Renovascular calcification seen of both renal hila, no definite renal stones. There is mild nonspecific perinephric stranding.  Stomach is distended with ingested contents. There is a small hiatal hernia. There are no dilated or thickened bowel loops. The appendix is normal. There scattered colonic diverticular without diverticulitis. No free air, free fluid, or intra-abdominal fluid collection. Surgical clips in the anterior abdominal wall at the emboli kiss, likely from hernia repair  Urinary bladder is minimally distended. Prostate gland is normal in size with central prostatic calcifications. No pelvic free fluid. Dense atherosclerosis of the pelvic vasculature.  There is degenerative disc disease and facet  arthropathy in the spine. No acute or suspicious osseous abnormality.  IMPRESSION: 1. Normal caliber abdominal aorta without aneurysm. There is dense atherosclerosis of the aorta and its branches. 2. Mildly distended gallbladder but without pericholecystic inflammatory change. If there is clinical concern for biliary pathology, consider right upper quadrant ultrasound. 3. Colonic diverticulosis  without diverticulitis.   Electronically Signed   By: Jeb Levering M.D.   On: 11/26/2014 05:47   Nm Hepato W/eject Fract  11/27/2014   CLINICAL DATA:  74 year old with nausea and abdominal pain for 2 days.  EXAM: NUCLEAR MEDICINE HEPATOBILIARY IMAGING WITH GALLBLADDER EF  TECHNIQUE: Sequential images of the abdomen were obtained out to 60 minutes following intravenous administration of radiopharmaceutical. After slow intravenous infusion of micrograms Cholecystokinin, gallbladder ejection fraction was determined.  RADIOPHARMACEUTICALS:  5.2 [employer] 123XX123 Choletec  COMPARISON:  Ultrasound 11/26/2014  FINDINGS: Radiopharmaceutical was taken up by the liver and excreted into the biliary system. There is gallbladder uptake at approximately 5 minutes. Activity is identified in the small bowel. The activity in the gallbladder continued to increase even after the CCK administration. Ejection fraction was actually -22%. At 45 min, normal ejection fraction is greater than 40%.  The patient did not experience symptoms during CCK infusion.  IMPRESSION: The gallbladder ejection fraction was less than zero. Gallbladder uptake continued to increase even after the administration of CCK. 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   Electronically Signed   By: Markus Daft M.D.   On: 11/27/2014 09:25   US Abdomen Limited  11/26/2014   CLINICAL DATA:  Abdominal pain  EXAM: US ABDOMEN LIMITED - RIGHT UPPER QUADRANT  COMPARISON:  11/26/2014  FINDINGS: Gallbladder:  The gallbladder is well distended. Wall thickening is noted to 4.5 mm. A negative sonographic Percell Miller sign is elicited. Echogenic material is noted within the gallbladder consistent with tumefactive sludge. No definitive gallstones are seen.  Common bile duct:  Diameter: 3.3 mm  Liver:  No focal lesion identified. Within normal limits in parenchymal echogenicity.  IMPRESSION: Gallbladder wall thickening. Tumefactive sludge is noted. In the appropriate  clinical setting, these changes could represent acute cholecystitis.  No other focal abnormality is noted.   Electronically Signed   By: Inez Catalina M.D.   On: 11/26/2014 10:14   Ct Cta Abd/pel W/cm &/or W/o Cm  11/26/2014   CLINICAL DATA:  Abdominal pain radiating to both sides and into upper back. History of esophageal reflux. Hiatal hernia. Coronary artery disease.  EXAM: CTA ABDOMEN AND PELVIS wITHOUT AND WITH CONTRAST  TECHNIQUE: Multidetector CT imaging of the abdomen and pelvis was performed using the standard protocol during bolus administration of intravenous contrast. Multiplanar reconstructed images and MIPs were obtained and reviewed to evaluate the vascular anatomy.  CONTRAST:  24mL OMNIPAQUE IOHEXOL 350 MG/ML SOLN  COMPARISON:  Stone study of earlier today.  FINDINGS: Lower chest: Bibasilar atelectasis and/or scarring. Mild cardiomegaly with prior median sternotomy. No pericardial or pleural effusion.  Hepatobiliary: No focal liver lesion. Suggestion of hyperemia adjacent the gallbladder, including on image 36 of series 4.  The gallbladder is mildly distended with subtle/possible pericholecystic edema. No calcified stones. No biliary ductal dilatation.  Pancreas: Normal, without mass or ductal dilatation.  Spleen: Normal  Adrenals/Urinary Tract: Normal adrenal glands. Mildly atrophic left kidney, without hydronephrosis or hydroureter. Normal appearance of the right kidney for age with renal vascular calcifications. Normal urinary bladder.  Stomach/Bowel: Normal stomach, without wall thickening. Normal colon, appendix, and terminal ileum. Normal small bowel.  Vascular/Lymphatic: Abdominal  aorta is normal in caliber, without evidence of dissection. No significant stenosis at the celiac origin. There is mild to moderate atherosclerotic irregularity at the origin of the superior mesenteric artery. 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 There is an  accessory lower pole left renal artery which is relatively diminutive.  The right renal artery demonstrates mild-to-moderate atherosclerotic narrowing at its origin. Inferior mesenteric artery is patent, as are both internal iliac arteries. Significant atherosclerotic irregularity involves the external iliac arteries and common femoral arteries bilaterally.  No abdominopelvic adenopathy.  Reproductive: Mild prostatomegaly.  Other: No significant free fluid. Prior ventral abdominal wall hernia repair.  Musculoskeletal: Advanced spondylosis, without acute osseous abnormality.  Review of the MIP images confirms the above findings.  IMPRESSION: 1. Extensive atherosclerotic irregularity throughout the aorta, without evidence of dissection. 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 2. Mild gallbladder distention with suspicion of mild pericholecystic edema. Acute cholecystitis cannot be excluded. Consider abdominal ultrasound. 3. No other explanation for patient's symptoms.   Electronically Signed   By: Abigail Miyamoto M.D.   On: 11/26/2014 07:41  [2 weeks]   Assessment:  74 year old male admitted with acute abdominal pain and CT findings raising concern for possible acute cholecystitis. Ultrasound of abdomen with sludge and gallbladder wall thickening. LFTs normal. Abdominal pain much improved from admission. 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 Clinically improved. No plans for surgery at this time.   Anemia, heme + stool. Slight drop this admission, likely dilutional. Followed at [facility]. As of note, patient was scheduled for an OV Friday with Dr. Hilarie Fredrickson at [place] a colonoscopy, appointment has been cancelled as of yesterday by wife with plans to reschedule. 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 TCS 2013 unremarkable. SB capsule 2013, single AVM proximal  SB.   Plan: 1. PPI daily. 2. Patient plans to follow up with Dr. Hilarie Fredrickson as outpatient. 3. From GI standpoint, he is stable for discharge. Will sign off.   Laureen Ochs. Bernarda Caffey [facility] [phone] 2/25/20169:55 AM     LOS: 2 days

## 2014-11-28 NOTE — Discharge Summary (Signed)
Physician Discharge Summary  [name] [name] [id number] DOB: 1940/11/27 DOA: 11/26/2014  PCP: [name], MD  Admit date: 11/26/2014 Discharge date: 11/28/2014  Time spent: 40  minutes  Recommendations for Outpatient Follow-up:  1. Follow up with PCP 1 week. Will need CBC and bmet to track hg and electrolytes/kidney function. Recommend monitoring of BP control as Cozaar held at discharge due to kidney function.  2. Follow up with coumadin clinic 12/11/14 as scheduled. (chart review indicates his INR goal 1.8-2.0 and INR 1.75 at discharge)  Discharge Diagnoses:  Principal Problem:   Abdominal pain Active Problems:   Iron deficiency anemia   Essential hypertension   COPD (chronic obstructive pulmonary disease) with emphysema   GERD   Chronic systolic CHF (congestive heart failure)   Obesity   Atrial fibrillation, chronic   Acute renal failure   Left leg pain   Discharge Condition: stable  Diet recommendation: heart healthy  Filed Weights   11/26/14 1410 11/27/14 0513 11/28/14 0617  Weight: 99.882 kg (220 lb 3.2 oz) 98.6 kg (217 lb 6 oz) 97.9 kg (215 lb 13.3 oz)    History of present illness:  [name] [name] is a 74 y.o. 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 fib, hypertension, hyperlipidemia, COPD presented to the emergency department on 11/26/14 with the chief complaint of persistent intractable abdominal pain. Initial evaluation concerning for intra-abdominal infection and/or acute cholecystitis as well as acute renal failure. Patient reported development of sudden onset abdominal pain the evening prior. Described the pain as sharp constant worsening located in lower quadrants radiating around 2 sides and lower back. He denied any nausea vomiting constipation diarrhea melena bright red blood per rectum. He denied chest pain palpitation shortness of breath cough headache fever  chills. He denied dysuria hematuria frequency or urgency. Workup in the emergency department included complete blood count significant for WBC of 14.6, hemoglobin 10.0 hematocrit [id number], basic metabolic panel revealed BUN 32 creatinine 1.46 serum glucose 125. INR was 1.92. Abdominal ultrasound revealed gallbladder wall thickening tumefactive sludge. CT of the abdomen with extensive atherosclerotic irregularity throughout the aorta without dissection, significant stenosis involving the left renal artery origin, mild gallbladder distention with suspicion of mild Cholecystic edema  Hospital Course:  Abdominal pain: resolved at discharge. Etiology not completely clear. concern for acute cholecystitis. Evaluated by GI and HIDA scan done revealing biliary dyskinesia. Evaluated by general surgery who opine no surgical intervention. He was provided with supportive therapy as well as zosyn and improved.t. At discharge tolerating diet. Will follow up with his primary GI in [facility]  Active Problems: Acute on chronic renal failure: lasix initially held. And IV fluids provided. Creatinine baseline appears to be 1.4. Lasix resume and IV fluids discontinued and creatinine trending down towards baseline at discharge. Will need PCP follow up with CBC and BMET in 1 week. Instructed patient to hold cozaar until that time.   Atrial fibrillation, chronic: Currently rate controlled. Resume home coumadin regimen. INR 1.75 at discharge and note in chart indicates his goal in 1.8-2.0. Has appointment 12/11/14 for INR check.     Iron deficiency anemia: remained stable at baseline. He is on iron. No Signs symptoms of active bleeding. Has GI appointment in [place]   Essential hypertension:controlled   COPD (chronic obstructive pulmonary disease) with emphysema: remained stable at baseline. nebulizers.   GERD: Stable at baseline continue PPI   Chronic systolic CHF (congestive heart  failure): Echo January 2016 shows an  EF of 55%. He is on Lasix and Cozaar. Developed pitting LE edema and sob. Provided with IV lasix and much improved at discharge.  Resume lasix at discharge   Obesity: BMI 31.6. Nutritional consult  CAD: Status post inferior MI in 1998 treated with PCI and subsequent CABG. status post stents October 2015. No chest pain. .     Procedures:  HIDA scan  Consultations:  GI   General surgery  Discharge Exam: Filed Vitals:   11/28/14 1019  BP: 141/53  Pulse:   Temp:   Resp:     General: obese appears comfortable Cardiovascular: irregularly irregular Respiratory: normal effort BS distant. Fine crackle left base  Discharge Instructions    Current Discharge Medication List    START taking these medications   Details  HYDROcodone-acetaminophen (NORCO/VICODIN) 5-325 MG per tablet Take 1 tablet by mouth every 6 (six) hours as needed for moderate pain. Qty: 30 tablet, Refills: 0      CONTINUE these medications which have CHANGED   Details  losartan (COZAAR) 50 MG tablet Hold until evaluated by PCP. Qty: 90 tablet, Refills: 3   Associated Diagnoses: Coronary artery disease involving native coronary artery of native heart without angina pectoris; Essential hypertension      CONTINUE these medications which have NOT CHANGED   Details  acetaminophen (TYLENOL) 500 MG tablet Take 1,000 mg by mouth every 6 (six) hours as needed. For pain.     albuterol (PROVENTIL HFA;VENTOLIN HFA) 108 (90 BASE) MCG/ACT inhaler Inhale 2 puffs into the lungs every 6 (six) hours as needed. For shortness of breath. Qty: 1 Inhaler, Refills: 3    aspirin EC 81 MG tablet Take 1 tablet (81 mg total) by mouth every Monday, Wednesday, and Friday.    budesonide (PULMICORT) 0.25 MG/2ML nebulizer solution Take 0.25 mg by nebulization 2 (two) times daily.     Cholecalciferol (VITAMIN D3) 5000 UNITS CAPS Take 5,000 Units by mouth daily.     ferrous sulfate 325 (65 FE) MG tablet Take 325 mg by mouth 3  (three) times daily.    furosemide (LASIX) 40 MG tablet Take 1 tablet (40 mg total) by mouth 2 (two) times daily. Qty: 60 tablet, Refills: 6    ipratropium-albuterol (DUONEB) 0.5-2.5 (3) MG/3ML SOLN Take 3 mLs by nebulization every 6 (six) hours as needed. For shortness of breath.     isosorbide-hydrALAZINE (BIDIL) 20-37.5 MG per tablet Take 1 tablet by mouth 3 (three) times daily. Qty: 90 tablet, Refills: 11   Associated Diagnoses: Shortness of breath    levothyroxine (SYNTHROID, LEVOTHROID) 100 MCG tablet Take 1 tablet (100 mcg total) by mouth daily. Qty: 90 tablet, Refills: 1    lovastatin (MEVACOR) 40 MG tablet Take 1 tablet (40 mg total) by mouth at bedtime. Qty: 90 tablet, Refills: 2    Multiple Vitamins-Minerals (MULTIVITAMINS THER. W/MINERALS) TABS Take 1 tablet by mouth daily.      nitroGLYCERIN (NITROSTAT) 0.4 MG SL tablet Place 0.4 mg under the tongue every 5 (five) minutes as needed for chest pain.    omeprazole (PRILOSEC) 40 MG capsule TAKE ONE CAPSULE BY MOUTH EVERY MORNING Qty: 90 capsule, Refills: 1    potassium chloride (K-DUR) 10 MEQ tablet Take 1 tablet (10 mEq total) by mouth daily. Qty: 30 tablet, Refills: 5    warfarin (COUMADIN) 5 MG tablet Take 2.5-5 mg by mouth daily. Take 5 mg Monday, Tuesday, Wednesday, Friday, and Sat. Take 2.5 mg on Thursday and  Sunday    hydrALAZINE (APRESOLINE) 25 MG tablet Take 1 tablet (25 mg total) by mouth 3 (three) times daily. Qty: 90 tablet, Refills: 6      STOP taking these medications     clopidogrel (PLAVIX) 75 MG tablet      isosorbide mononitrate (IMDUR) 60 MG 24 hr tablet        No Known Allergies Follow-up Information    Follow up with [name], MD. Schedule an appointment as soon as possible for a visit in 1 week.   Specialty:  Family Medicine   Why:  will need cbc and bmet   Contact information:   [place] [id number] [phone]        The results of significant diagnostics  from this hospitalization (including imaging, microbiology, ancillary and laboratory) are listed below for reference.    Significant Diagnostic Studies: Ct Abdomen Pelvis Wo Contrast  11/26/2014   CLINICAL DATA:  Upper abdominal pain radiating to the back. Concern for abdominal aortic aneurysm.  EXAM: CT ABDOMEN AND PELVIS WITHOUT CONTRAST  TECHNIQUE: Multidetector CT imaging of the abdomen and pelvis was performed following the standard protocol without IV contrast.  COMPARISON:  None.  FINDINGS: There is linear atelectasis in the right lower lobe and lingula. The heart is enlarged. Coronary artery calcifications are seen.  The abdominal aorta is normal in caliber without aneurysmal dilatation. There is dense atherosclerosis of the abdominal aorta and its branches. Dense calcification noted at the origin of the superior mesenteric artery. There is no periaortic soft tissue stranding or retroperitoneal fluid.  Probable hepatic steatosis. No focal hepatic lesion on noncontrast exam. Gallbladder mildly distended. No frank pericholecystic inflammatory change. The spleen, pancreas, and adrenal glands are normal.  There is no hydronephrosis. Renovascular calcification seen of both renal hila, no definite renal stones. There is mild nonspecific perinephric stranding.  Stomach is distended with ingested contents. There is a small hiatal hernia. There are no dilated or thickened bowel loops. The appendix is normal. There scattered colonic diverticular without diverticulitis. No free air, free fluid, or intra-abdominal fluid collection. Surgical clips in the anterior abdominal wall at the emboli kiss, likely from hernia repair  Urinary bladder is minimally distended. Prostate gland is normal in size with central prostatic calcifications. No pelvic free fluid. Dense atherosclerosis of the pelvic vasculature.  There is degenerative disc disease and facet arthropathy in the spine. No acute or suspicious osseous abnormality.   IMPRESSION: 1. Normal caliber abdominal aorta without aneurysm. There is dense atherosclerosis of the aorta and its branches. 2. Mildly distended gallbladder but without pericholecystic inflammatory change. If there is clinical concern for biliary pathology, consider right upper quadrant ultrasound. 3. Colonic diverticulosis without diverticulitis.   Electronically Signed   By: Jeb Levering M.D.   On: 11/26/2014 05:47   Nm Hepato W/eject Fract  11/27/2014   CLINICAL DATA:  74 year old with nausea and abdominal pain for 2 days.  EXAM: NUCLEAR MEDICINE HEPATOBILIARY IMAGING WITH GALLBLADDER EF  TECHNIQUE: Sequential images of the abdomen were obtained out to 60 minutes following intravenous administration of radiopharmaceutical. After slow intravenous infusion of micrograms Cholecystokinin, gallbladder ejection fraction was determined.  RADIOPHARMACEUTICALS:  5.2 [employer] [id number] Choletec  COMPARISON:  Ultrasound 11/26/2014  FINDINGS: Radiopharmaceutical was taken up by the liver and excreted into the biliary system. There is gallbladder uptake at approximately 5 minutes. Activity is identified in the small bowel. The activity in the gallbladder continued to increase even after the CCK administration.  Ejection fraction was actually -22%. At 45 min, normal ejection fraction is greater than 40%.  The patient did not experience symptoms during CCK infusion.  IMPRESSION: The gallbladder ejection fraction was less than zero. Gallbladder uptake continued to increase even after the administration of CCK. 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   Electronically Signed   By: Markus Daft M.D.   On: 11/27/2014 09:25   US Abdomen Limited  11/26/2014   CLINICAL DATA:  Abdominal pain  EXAM: US ABDOMEN LIMITED - RIGHT UPPER QUADRANT  COMPARISON:  11/26/2014  FINDINGS: Gallbladder:  The gallbladder is well distended. Wall thickening is noted to 4.5 mm. A negative sonographic Percell Miller sign is elicited. Echogenic  material is noted within the gallbladder consistent with tumefactive sludge. No definitive gallstones are seen.  Common bile duct:  Diameter: 3.3 mm  Liver:  No focal lesion identified. Within normal limits in parenchymal echogenicity.  IMPRESSION: Gallbladder wall thickening. Tumefactive sludge is noted. In the appropriate clinical setting, these changes could represent acute cholecystitis.  No other focal abnormality is noted.   Electronically Signed   By: Inez Catalina M.D.   On: 11/26/2014 10:14   Ct Cta Abd/pel W/cm &/or W/o Cm  11/26/2014   CLINICAL DATA:  Abdominal pain radiating to both sides and into upper back. History of esophageal reflux. Hiatal hernia. Coronary artery disease.  EXAM: CTA ABDOMEN AND PELVIS wITHOUT AND WITH CONTRAST  TECHNIQUE: Multidetector CT imaging of the abdomen and pelvis was performed using the standard protocol during bolus administration of intravenous contrast. Multiplanar reconstructed images and MIPs were obtained and reviewed to evaluate the vascular anatomy.  CONTRAST:  47mL OMNIPAQUE IOHEXOL 350 MG/ML SOLN  COMPARISON:  Stone study of earlier today.  FINDINGS: Lower chest: Bibasilar atelectasis and/or scarring. Mild cardiomegaly with prior median sternotomy. No pericardial or pleural effusion.  Hepatobiliary: No focal liver lesion. Suggestion of hyperemia adjacent the gallbladder, including on image 36 of series 4.  The gallbladder is mildly distended with subtle/possible pericholecystic edema. No calcified stones. No biliary ductal dilatation.  Pancreas: Normal, without mass or ductal dilatation.  Spleen: Normal  Adrenals/Urinary Tract: Normal adrenal glands. Mildly atrophic left kidney, without hydronephrosis or hydroureter. Normal appearance of the right kidney for age with renal vascular calcifications. Normal urinary bladder.  Stomach/Bowel: Normal stomach, without wall thickening. Normal colon, appendix, and terminal ileum. Normal small bowel.  Vascular/Lymphatic:  Abdominal aorta is normal in caliber, without evidence of dissection. No significant stenosis at the celiac origin. There is mild to moderate atherosclerotic irregularity at the origin of the superior mesenteric artery. 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 There is an accessory lower pole left renal artery which is relatively diminutive.  The right renal artery demonstrates mild-to-moderate atherosclerotic narrowing at its origin. Inferior mesenteric artery is patent, as are both internal iliac arteries. Significant atherosclerotic irregularity involves the external iliac arteries and common femoral arteries bilaterally.  No abdominopelvic adenopathy.  Reproductive: Mild prostatomegaly.  Other: No significant free fluid. Prior ventral abdominal wall hernia repair.  Musculoskeletal: Advanced spondylosis, without acute osseous abnormality.  Review of the MIP images confirms the above findings.  IMPRESSION: 1. Extensive atherosclerotic irregularity throughout the aorta, without evidence of dissection. 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 2. Mild gallbladder distention with suspicion of mild pericholecystic edema. Acute cholecystitis cannot be excluded. Consider abdominal ultrasound. 3. No other explanation for patient's symptoms.  Electronically Signed   By: Abigail Miyamoto M.D.   On: 11/26/2014 07:41    Microbiology: No results found for this or any previous visit (from the past 240 hour(s)).   Labs: Basic Metabolic Panel:  Recent Labs Lab 11/26/14 0404 11/27/14 0634 11/28/14 0611  NA 139 139 139  K 4.3 4.6 4.2  CL 104 104 103  CO2 27 29 30   GLUCOSE 125* 106* 100*  BUN 32* 33* 33*  CREATININE 1.46* 1.90* 1.75*  CALCIUM 9.1 8.7 8.4   Liver Function Tests:  Recent Labs Lab 11/26/14 0404 11/27/14 0634  AST 24 19  ALT 19 16  ALKPHOS 48 39  BILITOT 0.4 0.7  PROT 7.5 7.1   ALBUMIN 4.2 3.7    Recent Labs Lab 11/26/14 0404  LIPASE 24   No results for input(s): AMMONIA in the last 168 hours. CBC:  Recent Labs Lab 11/26/14 0404 11/27/14 0634 11/28/14 0611  WBC 14.6* 15.2* 11.4*  NEUTROABS 12.1*  --   --   HGB 10.0* 9.8* 8.9*  HCT 31.4* 31.0* 27.9*  MCV 99.1 100.6* 99.3  PLT 336 307 243   Cardiac Enzymes: No results for input(s): CKTOTAL, CKMB, CKMBINDEX, TROPONINI in the last 168 hours. BNP: BNP (last 3 results) No results for input(s): BNP in the last 8760 hours.  ProBNP (last 3 results)  Recent Labs  01/23/14 0842 06/04/14 1056  PROBNP 111.0* 147.0*    CBG: No results for input(s): GLUCAP in the last 168 hours.     SignedRadene Gunning  [facility] 11/28/2014, 10:32 AM

## 2014-11-28 NOTE — Progress Notes (Signed)
[place] for Warfarin Indication: atrial fibrillation  No Known Allergies  Patient Measurements: Height: 5\' 10"  (177.8 cm) Weight: 215 lb 13.3 oz (97.9 kg) IBW/kg (Calculated) : 73  Vital Signs: Temp: 100 F (37.8 C) (02/25 0617) Temp Source: Oral (02/25 0617) BP: 140/52 mmHg (02/25 0617) Pulse Rate: 55 (02/25 0617)  Labs:  Recent Labs  11/26/14 0404 11/27/14 0634 11/28/14 0611  HGB 10.0* 9.8* 8.9*  HCT 31.4* 31.0* 27.9*  PLT 336 307 243  LABPROT 22.1* 20.8* 20.7*  INR 1.92* 1.77* 1.76*  CREATININE 1.46* 1.90* 1.75*    Estimated Creatinine Clearance: 42.8 mL/min (by C-G formula based on Cr of 1.75).   Medical History: Past Medical History  Diagnosis Date  . Unspecified essential hypertension   . Anemia   . Asthma   . Hepatomegaly   . Esophageal reflux   . Other and unspecified coagulation defects   . Hiatal hernia   . Personal history of colonic polyps 05/29/2010    TUBULAR ADENOMA  . CAD (coronary artery disease)   . Hypothyroidism   . Gout   . Hyperlipidemia   . Gastric antral vascular ectasia 2013  . Atrial fibrillation   . History of blood transfusion ~ 2012    "blood count dropped; had to get 3 units"  . Arthritis     "hips; back" (07/09/2014)  . On home oxygen therapy     "2L at night" (07/09/2014)  . COPD (chronic obstructive pulmonary disease)     Medications:  Prescriptions prior to admission  Medication Sig Dispense Refill Last Dose  . acetaminophen (TYLENOL) 500 MG tablet Take 1,000 mg by mouth every 6 (six) hours as needed. For pain.    Past Week at Unknown time  . albuterol (PROVENTIL HFA;VENTOLIN HFA) 108 (90 BASE) MCG/ACT inhaler Inhale 2 puffs into the lungs every 6 (six) hours as needed. For shortness of breath. 1 Inhaler 3 unknown  . aspirin EC 81 MG tablet Take 1 tablet (81 mg total) by mouth every Monday, Wednesday, and Friday.   11/25/2014 at Unknown time  . budesonide (PULMICORT) 0.25 MG/2ML  nebulizer solution Take 0.25 mg by nebulization 2 (two) times daily.    11/26/2014 at Unknown time  . Cholecalciferol (VITAMIN D3) 5000 UNITS CAPS Take 5,000 Units by mouth daily.    11/25/2014 at Unknown time  . clopidogrel (PLAVIX) 75 MG tablet Take 1 tablet (75 mg total) by mouth daily with breakfast. 30 tablet 1 11/25/2014 at Unknown time  . ferrous sulfate 325 (65 FE) MG tablet Take 325 mg by mouth 3 (three) times daily.   11/25/2014 at Unknown time  . furosemide (LASIX) 40 MG tablet Take 1 tablet (40 mg total) by mouth 2 (two) times daily. 60 tablet 6 11/25/2014 at Unknown time  . ipratropium-albuterol (DUONEB) 0.5-2.5 (3) MG/3ML SOLN Take 3 mLs by nebulization every 6 (six) hours as needed. For shortness of breath.    11/26/2014 at Unknown time  . isosorbide-hydrALAZINE (BIDIL) 20-37.5 MG per tablet Take 1 tablet by mouth 3 (three) times daily. 90 tablet 11 11/25/2014 at Unknown time  . levothyroxine (SYNTHROID, LEVOTHROID) 100 MCG tablet Take 1 tablet (100 mcg total) by mouth daily. 90 tablet 1 11/25/2014 at Unknown time  . losartan (COZAAR) 50 MG tablet Take 1 tablet (50 mg total) by mouth daily. 90 tablet 3 11/25/2014 at Unknown time  . lovastatin (MEVACOR) 40 MG tablet Take 1 tablet (40 mg total) by mouth at bedtime. 90 tablet  2 11/25/2014 at Unknown time  . Multiple Vitamins-Minerals (MULTIVITAMINS THER. W/MINERALS) TABS Take 1 tablet by mouth daily.     11/25/2014 at Unknown time  . nitroGLYCERIN (NITROSTAT) 0.4 MG SL tablet Place 0.4 mg under the tongue every 5 (five) minutes as needed for chest pain.   unknown  . omeprazole (PRILOSEC) 40 MG capsule TAKE ONE CAPSULE BY MOUTH EVERY MORNING 90 capsule 1 11/26/2014 at Unknown time  . potassium chloride (K-DUR) 10 MEQ tablet Take 1 tablet (10 mEq total) by mouth daily. 30 tablet 5 11/25/2014 at Unknown time  . warfarin (COUMADIN) 5 MG tablet Take 2.5-5 mg by mouth daily. Take 5 mg Monday, Tuesday, Wednesday, Friday, and Sat. Take 2.5 mg on Thursday and  Sunday   11/25/2014 at Unknown time  . hydrALAZINE (APRESOLINE) 25 MG tablet Take 1 tablet (25 mg total) by mouth 3 (three) times daily. 90 tablet 6   . isosorbide mononitrate (IMDUR) 60 MG 24 hr tablet Take 1 tablet (60 mg total) by mouth daily. (Patient not taking: Reported on 11/26/2014) 30 tablet 6 Not Taking at Unknown time  . warfarin (COUMADIN) 5 MG tablet TAKE 1 TABLET BY MOUTH EVERY DAY OR AS DIRECTED BY ANTICOAGULATION CLINIC 30 tablet 2 Taking    Assessment: 2 yoM on chronic warfarin for atrial fibrillation.  Home dose listed above.   INR was sub-therapeutic on admission.  Patient was admitted with abdominal pain & warfarin held until need for surgery ruled out.  Warfarin resumed 2/24.   INR now returning to goal.  No bleeding noted.   Goal of Therapy:  INR 2-3   Plan:  Coumadin 5 mg po x 1 dose tonight INR daily  Nawaal Alling, Lavonia Drafts 11/28/2014,8:31 AM

## 2014-11-29 ENCOUNTER — Ambulatory Visit: Payer: Medicare Other | Admitting: Internal Medicine

## 2014-11-29 NOTE — Telephone Encounter (Signed)
appt scheduled

## 2014-11-30 NOTE — Progress Notes (Signed)
Patient was discharged home with instructions given on medications,and follow up visits,patient,and family verbalized understanding. Education,given to patient on how to use Incentive Spirometry,patient verbalized understanding with a return demonstration. Prescriptions sent home with patient. Accompanied by staff to an awaiting vehicle.

## 2014-12-11 ENCOUNTER — Ambulatory Visit (INDEPENDENT_AMBULATORY_CARE_PROVIDER_SITE_OTHER): Payer: Medicare Other | Admitting: Pharmacist

## 2014-12-11 VITALS — Wt 218.0 lb

## 2014-12-11 DIAGNOSIS — D649 Anemia, unspecified: Secondary | ICD-10-CM | POA: Diagnosis not present

## 2014-12-11 DIAGNOSIS — R7989 Other specified abnormal findings of blood chemistry: Secondary | ICD-10-CM | POA: Diagnosis not present

## 2014-12-11 DIAGNOSIS — R748 Abnormal levels of other serum enzymes: Secondary | ICD-10-CM

## 2014-12-11 DIAGNOSIS — I4891 Unspecified atrial fibrillation: Secondary | ICD-10-CM | POA: Diagnosis not present

## 2014-12-11 DIAGNOSIS — I482 Chronic atrial fibrillation, unspecified: Secondary | ICD-10-CM

## 2014-12-11 LAB — POCT CBC
Granulocyte percent: 76.3 %G (ref 37–80)
HEMATOCRIT: 33.2 % — AB (ref 43.5–53.7)
HEMOGLOBIN: 10 g/dL — AB (ref 14.1–18.1)
Lymph, poc: 1.8 (ref 0.6–3.4)
MCH, POC: 28.9 pg (ref 27–31.2)
MCHC: 30.3 g/dL — AB (ref 31.8–35.4)
MCV: 95.4 fL (ref 80–97)
MPV: 8.4 fL (ref 0–99.8)
POC Granulocyte: 8.6 — AB (ref 2–6.9)
POC LYMPH %: 16.2 % (ref 10–50)
Platelet Count, POC: 442 10*3/uL — AB (ref 142–424)
RBC: 3.48 M/uL — AB (ref 4.69–6.13)
RDW, POC: 15.5 %
WBC: 11.3 10*3/uL — AB (ref 4.6–10.2)

## 2014-12-11 LAB — POCT INR: INR: 1.9

## 2014-12-11 NOTE — Patient Instructions (Signed)
Anticoagulation Dose Instructions as of 12/11/2014      [name] [name] Tue Wed Thu Fri Sat   New Dose 2.5 mg 5 mg 5 mg 5 mg 2.5 mg 5 mg 5 mg    Description        Goal INR 1.8-2.0 Continue warfarin 1/2 tablet on sundays and thursdays.  Take 1 tablet all other days.         INR was 1.9 today (goal if 1.8 to 2.0)

## 2014-12-12 ENCOUNTER — Ambulatory Visit (INDEPENDENT_AMBULATORY_CARE_PROVIDER_SITE_OTHER): Payer: Medicare Other | Admitting: Family Medicine

## 2014-12-12 ENCOUNTER — Encounter: Payer: Self-pay | Admitting: Family Medicine

## 2014-12-12 VITALS — BP 140/47 | HR 59 | Temp 98.0°F | Ht 70.0 in | Wt 215.0 lb

## 2014-12-12 DIAGNOSIS — I482 Chronic atrial fibrillation, unspecified: Secondary | ICD-10-CM

## 2014-12-12 DIAGNOSIS — N183 Chronic kidney disease, stage 3 unspecified: Secondary | ICD-10-CM

## 2014-12-12 DIAGNOSIS — I2510 Atherosclerotic heart disease of native coronary artery without angina pectoris: Secondary | ICD-10-CM | POA: Diagnosis not present

## 2014-12-12 DIAGNOSIS — Q613 Polycystic kidney, unspecified: Secondary | ICD-10-CM

## 2014-12-12 DIAGNOSIS — M461 Sacroiliitis, not elsewhere classified: Secondary | ICD-10-CM | POA: Diagnosis not present

## 2014-12-12 DIAGNOSIS — I420 Dilated cardiomyopathy: Secondary | ICD-10-CM

## 2014-12-12 DIAGNOSIS — R202 Paresthesia of skin: Secondary | ICD-10-CM

## 2014-12-12 DIAGNOSIS — I10 Essential (primary) hypertension: Secondary | ICD-10-CM

## 2014-12-12 LAB — BMP8+EGFR
BUN/Creatinine Ratio: 14 (ref 10–22)
BUN: 20 mg/dL (ref 8–27)
CHLORIDE: 99 mmol/L (ref 97–108)
CO2: 25 mmol/L (ref 18–29)
Calcium: 9.4 mg/dL (ref 8.6–10.2)
Creatinine, Ser: 1.46 mg/dL — ABNORMAL HIGH (ref 0.76–1.27)
GFR calc Af Amer: 54 mL/min/{1.73_m2} — ABNORMAL LOW (ref >59)
GFR calc non Af Amer: 46 mL/min/{1.73_m2} — ABNORMAL LOW (ref >59)
Glucose: 91 mg/dL (ref 65–99)
Potassium: 5.1 mmol/L (ref 3.5–5.2)
Sodium: 139 mmol/L (ref 134–144)

## 2014-12-12 MED ORDER — PREDNISONE 20 MG PO TABS: 20.0000 mg | ORAL_TABLET | Freq: Every day | ORAL | Status: DC

## 2014-12-12 MED ORDER — LEVOTHYROXINE SODIUM 112 MCG PO TABS: 100.0000 ug | ORAL_TABLET | Freq: Every day | ORAL | Status: DC

## 2014-12-12 MED ORDER — LOSARTAN POTASSIUM 50 MG PO TABS: 25.0000 mg | ORAL_TABLET | Freq: Every day | ORAL | Status: DC

## 2014-12-12 NOTE — Progress Notes (Signed)
Subjective:    Patient ID: [name] [name], male    DOB: Mar 21, 1941, 74 y.o.   MRN: [id number]  HPI Patient here today for hospital follow up from [facility]. He was admitted for abdominal pain on 11/26/14. He states today he feels better and he is accompanied by his wife. Patient had a CT scan in the hospital that was essentially normal. 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 He is planning to see a GI doctor and [facility] and that appointment has been set and is coming up soon. Also of concern is that he was taken off of his Lotrel in the hospital due to concern for renal insufficiency. He has a history of polycystic kidney disease and congestive heart failure with chronic renal insufficiency. He has been asked to see me to determine if he needs to go back on his losartan. He has a history of atrial fibrillation but also of some GI related bleeding in the past and was taken off of his Coumadin. During that time he had what was thought to be a possible TIA and therefore he has been placed back on Coumadin with 81 mg of aspirin a day. It is kept at a dose that he will maintain his INR at 1.8-2.0 this is intended to minimize his stroke risk while avoiding GI bleeding. He had blood work performed in the office yesterday. The results today show improvement in the kidney. His INR was 1.9.  Is also experiencing left sacroiliac region pain. This is associated with numbness down the left lower extremity anteriorly from the thigh into the shin region     Patient Active Problem List   Diagnosis Date Noted  . Fever   . Abdominal pain 11/26/2014  . Acute renal failure 11/26/2014  . Left leg pain 11/26/2014  . 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 Abnormal nuclear stress test 07/09/2014  . 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 Coronary atherosclerosis of native coronary artery 06/04/2014  . Dyspnea 07/18/2013  . Weakness generalized 07/16/2013  .  Chills 07/16/2013  . Chest congestion 07/16/2013  . Atrial fibrillation, chronic 01/16/2013  . Gout 12/16/2012  . Other and unspecified hyperlipidemia 12/16/2012  . Gastric antral vascular ectasia   . 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 Anemia, unspecified 11/29/2011  . Nonspecific abnormal finding in stool contents 11/29/2011  . Heme positive stool 11/26/2011  . Anemia 11/26/2011  . Chronic systolic CHF (congestive heart failure) 11/26/2011  . Anticoagulant long-term use 11/26/2011  . Hx of gastroesophageal reflux (GERD) 11/26/2011  . Obesity 11/26/2011  . 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 Iron deficiency anemia 05/13/2010  . COAGULOPATHY 05/13/2010  . 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 GERD 05/13/2010  . 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 Essential hypertension 02/03/2008   Outpatient Encounter Prescriptions as of 12/12/2014  Medication Sig  . acetaminophen (TYLENOL) 500 MG tablet Take 1,000 mg by mouth every 6 (six) hours as needed. For pain.   Marland Kitchen albuterol (PROVENTIL HFA;VENTOLIN HFA) 108 (90 BASE) MCG/ACT inhaler Inhale 2 puffs into the lungs every 6 (six) hours as needed. For shortness of breath.  Marland Kitchen aspirin EC 81 MG tablet Take 1 tablet (81 mg total) by mouth every Monday, Wednesday, and Friday.  . budesonide (PULMICORT) 0.25 MG/2ML nebulizer solution Take 0.25 mg by nebulization 2 (two) times daily.   . Cholecalciferol (VITAMIN D3) 5000 UNITS CAPS Take 5,000 Units by mouth daily.   Marland Kitchen  ferrous sulfate 325 (65 FE) MG tablet Take 325 mg by mouth 3 (three) times daily.  . furosemide (LASIX) 40 MG tablet Take 1 tablet (40 mg total) by mouth 2 (two) times daily.  . hydrALAZINE (APRESOLINE) 25 MG tablet Take 1 tablet (25 mg total) by mouth 3 (three) times daily.  Marland Kitchen HYDROcodone-acetaminophen (NORCO/VICODIN) 5-325 MG per tablet Take 1 tablet by mouth every 6 (six) hours as needed for moderate pain.  Marland Kitchen ipratropium-albuterol (DUONEB) 0.5-2.5 (3) MG/3ML SOLN  Take 3 mLs by nebulization every 6 (six) hours as needed. For shortness of breath.   . isosorbide-hydrALAZINE (BIDIL) 20-37.5 MG per tablet Take 1 tablet by mouth 3 (three) times daily.  Marland Kitchen levothyroxine (SYNTHROID, LEVOTHROID) 112 MCG tablet Take 1 tablet (112 mcg total) by mouth daily before breakfast.  . losartan (COZAAR) 50 MG tablet Take 0.5 tablets (25 mg total) by mouth daily.  Marland Kitchen lovastatin (MEVACOR) 40 MG tablet Take 1 tablet (40 mg total) by mouth at bedtime.  . Multiple Vitamins-Minerals (MULTIVITAMINS THER. W/MINERALS) TABS Take 1 tablet by mouth daily.    Marland Kitchen omeprazole (PRILOSEC) 40 MG capsule TAKE ONE CAPSULE BY MOUTH EVERY MORNING  . potassium chloride (K-DUR) 10 MEQ tablet Take 1 tablet (10 mEq total) by mouth daily.  Marland Kitchen warfarin (COUMADIN) 5 MG tablet Take 2.5-5 mg by mouth daily. Take 5 mg Monday, Tuesday, Wednesday, Friday, and Sat. Take 2.5 mg on Thursday and Sunday  . [DISCONTINUED] levothyroxine (SYNTHROID, LEVOTHROID) 100 MCG tablet Take 1 tablet (100 mcg total) by mouth daily.  . [DISCONTINUED] losartan (COZAAR) 50 MG tablet Hold until evaluated by PCP.  Marland Kitchen nitroGLYCERIN (NITROSTAT) 0.4 MG SL tablet Place 0.4 mg under the tongue every 5 (five) minutes as needed for chest pain.  . predniSONE (DELTASONE) 20 MG tablet Take 1 tablet (20 mg total) by mouth daily with breakfast.    Review of Systems  Constitutional: Negative.   HENT: Negative.   Eyes: Negative.   Respiratory: Negative.   Cardiovascular: Negative.   Gastrointestinal: Negative.   Endocrine: Negative.   Genitourinary: Negative.   Musculoskeletal: Positive for myalgias (left leg pain and numbness) and back pain (lower left hip and back pain).  Skin: Negative.   Allergic/Immunologic: Negative.   Neurological: Negative.   Hematological: Negative.   Psychiatric/Behavioral: Negative.        Objective:   Physical Exam  Constitutional: He is oriented to person, place, and time. He appears well-developed and  well-nourished. No distress.  HENT:  Head: Normocephalic and atraumatic.  Right Ear: External ear normal.  Left Ear: External ear normal.  Nose: Nose normal.  Mouth/Throat: Oropharynx is clear and moist.  Eyes: Conjunctivae and EOM are normal. Pupils are equal, round, and reactive to light.  Neck: Normal range of motion. Neck supple. No thyromegaly present.  Cardiovascular: Normal rate, regular rhythm and normal heart sounds.   No murmur heard. Pulmonary/Chest: Effort normal and breath sounds normal. No respiratory distress. He has no wheezes. He has no rales.  Abdominal: Soft. Bowel sounds are normal. He exhibits no distension. There is no tenderness.  Musculoskeletal: He exhibits edema (1-2+ at ankles).  Lymphadenopathy:    He has no cervical adenopathy.  Neurological: He is alert and oriented to person, place, and time. He has normal reflexes.  Skin: Skin is warm and dry.  Psychiatric: He has a normal mood and affect. His behavior is normal. Judgment and thought content normal.   BP 140/47 mmHg  Pulse 59  Temp(Src) 98 F (36.7  C) (Oral)  Ht 5\' 10"  (1.778 m)  Wt 215 lb (97.523 kg)  BMI 30.85 kg/m2        Assessment & Plan:   1. Coronary artery disease involving native coronary artery of native heart without angina pectoris   2. Essential hypertension   3. Chronic renal insufficiency, stage 3 (moderate)   4. Polycystic kidney disease   5. Congestive cardiomyopathy   6. Atrial fibrillation, chronic   7. Sacroiliac inflammation   8. Paresthesia of left lower extremity     Meds ordered this encounter  Medications  . losartan (COZAAR) 50 MG tablet    Sig: Take 0.5 tablets (25 mg total) by mouth daily.    Dispense:  90 tablet    Refill:  3  . levothyroxine (SYNTHROID, LEVOTHROID) 112 MCG tablet    Sig: Take 1 tablet (112 mcg total) by mouth daily before breakfast.    Dispense:  30 tablet    Refill:  2  . predniSONE (DELTASONE) 20 MG tablet    Sig: Take 1 tablet (20  mg total) by mouth daily with breakfast.    Dispense:  15 tablet    Refill:  0    No orders of the defined types were placed in this encounter.    Labs pending Health Maintenance reviewed Diet and exercise encouraged Continue all meds as discussed Follow up in 1week  Claretta Fraise, MD

## 2014-12-19 ENCOUNTER — Other Ambulatory Visit: Payer: Medicare Other

## 2014-12-19 ENCOUNTER — Other Ambulatory Visit (INDEPENDENT_AMBULATORY_CARE_PROVIDER_SITE_OTHER): Payer: Medicare Other

## 2014-12-19 DIAGNOSIS — D649 Anemia, unspecified: Secondary | ICD-10-CM | POA: Diagnosis not present

## 2014-12-19 LAB — POCT CBC
Granulocyte percent: 83.7 %G — AB (ref 37–80)
HCT, POC: 33.3 % — AB (ref 43.5–53.7)
HEMOGLOBIN: 10.5 g/dL — AB (ref 14.1–18.1)
Lymph, poc: 1.5 (ref 0.6–3.4)
MCH: 29.9 pg (ref 27–31.2)
MCHC: 31.4 g/dL — AB (ref 31.8–35.4)
MCV: 95.1 fL (ref 80–97)
MPV: 9.8 fL (ref 0–99.8)
PLATELET COUNT, POC: 476 10*3/uL — AB (ref 142–424)
POC Granulocyte: 11.9 — AB (ref 2–6.9)
POC LYMPH PERCENT: 10.7 %L (ref 10–50)
RBC: 3.5 M/uL — AB (ref 4.69–6.13)
RDW, POC: 16.1 %
WBC: 14.2 10*3/uL — AB (ref 4.6–10.2)

## 2014-12-19 NOTE — Progress Notes (Signed)
Lab only 

## 2014-12-20 ENCOUNTER — Ambulatory Visit (INDEPENDENT_AMBULATORY_CARE_PROVIDER_SITE_OTHER): Payer: Medicare Other | Admitting: Family Medicine

## 2014-12-20 ENCOUNTER — Encounter: Payer: Self-pay | Admitting: Family Medicine

## 2014-12-20 VITALS — Ht 70.0 in | Wt 213.6 lb

## 2014-12-20 DIAGNOSIS — I482 Chronic atrial fibrillation, unspecified: Secondary | ICD-10-CM

## 2014-12-20 DIAGNOSIS — R109 Unspecified abdominal pain: Secondary | ICD-10-CM | POA: Diagnosis not present

## 2014-12-20 DIAGNOSIS — E785 Hyperlipidemia, unspecified: Secondary | ICD-10-CM | POA: Diagnosis not present

## 2014-12-20 DIAGNOSIS — E039 Hypothyroidism, unspecified: Secondary | ICD-10-CM | POA: Diagnosis not present

## 2014-12-20 LAB — POCT INR: INR: 2.5

## 2014-12-20 MED ORDER — ESOMEPRAZOLE MAGNESIUM 40 MG PO CPDR: DELAYED_RELEASE_CAPSULE | ORAL | Status: DC

## 2014-12-20 NOTE — Progress Notes (Signed)
Subjective:  Patient ID: [name] [name], male    DOB: March 08, 1941  Age: 74 y.o. MRN: [id number]  CC: Abdominal Pain   HPI Firas R Vail presents for left lower abd at flank. Pain is moderate. It is across the midline into the right suprapubic area as well. It has been associated with mild to moderate dysuria. Patient has noted some urinary hesitancy and nocturia recently. Onset of flank pain is one week.  Patient also being seen for atrial fibrillation. He is currently anticoagulated. He denies excessive bruising or bleeding from any orifice.  Patient in for follow-up of GERD. Currently asymptomatic taking  PPI daily. There is no chest pain or heartburn. No hematemesis and no melena. No dysphagia or choking. Onset is remote. Progression is stable. Complicating factors, none.   Patient presents for follow-up on  thyroid. She has a history of hypothyroidism for many years. It has been stable recently. Pt. denies any change in  voice, loss of hair, heat or cold intolerance. Energy level has been adequate to good. She denies constipation and diarrhea. No myxedema. Medication is as noted below. Verified that pt is taking it daily on an empty stomach. Well tolerated. History Elihue has a past medical history of Unspecified essential hypertension; Anemia; Asthma; Hepatomegaly; Esophageal reflux; Other and unspecified coagulation defects; Hiatal hernia; Personal history of colonic polyps (05/29/2010); CAD (coronary artery disease); Hypothyroidism; Gout; Hyperlipidemia; Gastric antral vascular ectasia (2013); Atrial fibrillation; History of blood transfusion (~ 2012); Arthritis; On home oxygen therapy; and COPD (chronic obstructive pulmonary disease).   He has past surgical history that includes Umbilical hernia repair; Coronary angioplasty with stent (~ 2008; 07/08/2014); Coronary artery bypass graft (1998); Hernia repair; Umbilical hernia repair (1970's); left and right heart catheterization with  coronary/graft angiogram (N/A, 07/09/2014); Colonoscopy (2013); Esophagogastroduodenoscopy (2013); and Givens capsule study (2013).   His family history includes Cancer in his father; Early death in his brother; Heart attack in his brother; Heart disease in his father; Hypertension in an other family member; Kidney disease in his mother; Leukemia in his mother; Stomach cancer in his sister and sister. There is no history of Colon cancer.He reports that he quit smoking about 18 years ago. His smoking use included Cigarettes. He has a 42 pack-year smoking history. He has never used smokeless tobacco. He reports that he drinks about 0.6 oz of alcohol per week. He reports that he does not use illicit drugs.  Current Outpatient Prescriptions on File Prior to Visit  Medication Sig Dispense Refill  . acetaminophen (TYLENOL) 500 MG tablet Take 1,000 mg by mouth every 6 (six) hours as needed. For pain.     Marland Kitchen albuterol (PROVENTIL HFA;VENTOLIN HFA) 108 (90 BASE) MCG/ACT inhaler Inhale 2 puffs into the lungs every 6 (six) hours as needed. For shortness of breath. 1 Inhaler 3  . aspirin EC 81 MG tablet Take 1 tablet (81 mg total) by mouth every Monday, Wednesday, and Friday.    . budesonide (PULMICORT) 0.25 MG/2ML nebulizer solution Take 0.25 mg by nebulization 2 (two) times daily.     . Cholecalciferol (VITAMIN D3) 5000 UNITS CAPS Take 5,000 Units by mouth daily.     . ferrous sulfate 325 (65 FE) MG tablet Take 325 mg by mouth 3 (three) times daily.    . furosemide (LASIX) 40 MG tablet Take 1 tablet (40 mg total) by mouth 2 (two) times daily. 60 tablet 6  . hydrALAZINE (APRESOLINE) 25 MG tablet Take 1 tablet (25 mg total) by mouth  3 (three) times daily. 90 tablet 6  . HYDROcodone-acetaminophen (NORCO/VICODIN) 5-325 MG per tablet Take 1 tablet by mouth every 6 (six) hours as needed for moderate pain. 30 tablet 0  . ipratropium-albuterol (DUONEB) 0.5-2.5 (3) MG/3ML SOLN Take 3 mLs by nebulization every 6 (six) hours  as needed. For shortness of breath.     . isosorbide-hydrALAZINE (BIDIL) 20-37.5 MG per tablet Take 1 tablet by mouth 3 (three) times daily. 90 tablet 11  . levothyroxine (SYNTHROID, LEVOTHROID) 112 MCG tablet Take 1 tablet (112 mcg total) by mouth daily before breakfast. 30 tablet 2  . losartan (COZAAR) 50 MG tablet Take 0.5 tablets (25 mg total) by mouth daily. 90 tablet 3  . lovastatin (MEVACOR) 40 MG tablet Take 1 tablet (40 mg total) by mouth at bedtime. 90 tablet 2  . Multiple Vitamins-Minerals (MULTIVITAMINS THER. W/MINERALS) TABS Take 1 tablet by mouth daily.      . nitroGLYCERIN (NITROSTAT) 0.4 MG SL tablet Place 0.4 mg under the tongue every 5 (five) minutes as needed for chest pain.    . potassium chloride (K-DUR) 10 MEQ tablet Take 1 tablet (10 mEq total) by mouth daily. 30 tablet 5  . predniSONE (DELTASONE) 20 MG tablet Take 1 tablet (20 mg total) by mouth daily with breakfast. 15 tablet 0  . warfarin (COUMADIN) 5 MG tablet Take 2.5-5 mg by mouth daily. Take 5 mg Monday, Tuesday, Wednesday, Friday, and Sat. Take 2.5 mg on Thursday and Sunday     No current facility-administered medications on file prior to visit.    ROS Review of Systems  Constitutional: Negative for fever, chills, diaphoresis and unexpected weight change.  HENT: Negative for congestion, hearing loss, rhinorrhea, sore throat and trouble swallowing.   Respiratory: Negative for cough, chest tightness, shortness of breath and wheezing.   Gastrointestinal: Positive for abdominal pain. Negative for nausea, vomiting, diarrhea, constipation and abdominal distention.  Endocrine: Negative for cold intolerance and heat intolerance.  Genitourinary: Negative for dysuria, hematuria and flank pain.  Musculoskeletal: Negative for joint swelling and arthralgias.  Skin: Negative for rash.  Neurological: Negative for dizziness and headaches.  Psychiatric/Behavioral: Negative for dysphoric mood, decreased concentration and  agitation. The patient is not nervous/anxious.     Objective:  Ht 5\' 10"  (1.778 m)  Wt 213 lb 9.6 oz (96.888 kg)  BMI 30.65 kg/m2  BP Readings from Last 3 Encounters:  12/12/14 140/47  11/28/14 147/49  10/08/14 130/50    Wt Readings from Last 3 Encounters:  12/20/14 213 lb 9.6 oz (96.888 kg)  12/12/14 215 lb (97.523 kg)  12/11/14 218 lb (98.884 kg)     Physical Exam  Constitutional: He is oriented to person, place, and time. He appears well-developed and well-nourished. No distress.  HENT:  Head: Normocephalic and atraumatic.  Right Ear: External ear normal.  Left Ear: External ear normal.  Nose: Nose normal.  Mouth/Throat: Oropharynx is clear and moist.  Eyes: Conjunctivae and EOM are normal. Pupils are equal, round, and reactive to light.  Neck: Normal range of motion. Neck supple. No thyromegaly present.  Cardiovascular: Normal rate, regular rhythm and normal heart sounds.   No murmur heard. Pulmonary/Chest: Effort normal and breath sounds normal. No respiratory distress. He has no wheezes. He has no rales.  Abdominal: Soft. Bowel sounds are normal. He exhibits no distension. There is tenderness (mild diffuse tenderness through the lower quadrants and suprapubic regions).  Lymphadenopathy:    He has no cervical adenopathy.  Neurological: He is alert and  oriented to person, place, and time. He has normal reflexes.  Skin: Skin is warm and dry.  Psychiatric: He has a normal mood and affect. His behavior is normal. Judgment and thought content normal.    No results found for: HGBA1C  Lab Results  Component Value Date   WBC 14.2* 12/19/2014   HGB 10.5* 12/19/2014   HCT 33.3* 12/19/2014   PLT 243 11/28/2014   GLUCOSE 91 12/11/2014   CHOL 138 07/05/2014   TRIG 147.0 07/05/2014   HDL 37.60* 07/05/2014   LDLCALC 71 07/05/2014   ALT 16 11/27/2014   AST 19 11/27/2014   NA 139 12/11/2014   K 5.1 12/11/2014   CL 99 12/11/2014   CREATININE 1.46* 12/11/2014   BUN 20  12/11/2014   CO2 25 12/11/2014   TSH 7.666* 11/26/2014   PSA 0.78 11/26/2011   INR 2.5 12/20/2014    Ct Abdomen Pelvis Wo Contrast  11/26/2014   CLINICAL DATA:  Upper abdominal pain radiating to the back. Concern for abdominal aortic aneurysm.  EXAM: CT ABDOMEN AND PELVIS WITHOUT CONTRAST  TECHNIQUE: Multidetector CT imaging of the abdomen and pelvis was performed following the standard protocol without IV contrast.  COMPARISON:  None.  FINDINGS: There is linear atelectasis in the right lower lobe and lingula. The heart is enlarged. Coronary artery calcifications are seen.  The abdominal aorta is normal in caliber without aneurysmal dilatation. There is dense atherosclerosis of the abdominal aorta and its branches. Dense calcification noted at the origin of the superior mesenteric artery. There is no periaortic soft tissue stranding or retroperitoneal fluid.  Probable hepatic steatosis. No focal hepatic lesion on noncontrast exam. Gallbladder mildly distended. No frank pericholecystic inflammatory change. The spleen, pancreas, and adrenal glands are normal.  There is no hydronephrosis. Renovascular calcification seen of both renal hila, no definite renal stones. There is mild nonspecific perinephric stranding.  Stomach is distended with ingested contents. There is a small hiatal hernia. There are no dilated or thickened bowel loops. The appendix is normal. There scattered colonic diverticular without diverticulitis. No free air, free fluid, or intra-abdominal fluid collection. Surgical clips in the anterior abdominal wall at the emboli kiss, likely from hernia repair  Urinary bladder is minimally distended. Prostate gland is normal in size with central prostatic calcifications. No pelvic free fluid. Dense atherosclerosis of the pelvic vasculature.  There is degenerative disc disease and facet arthropathy in the spine. No acute or suspicious osseous abnormality.  IMPRESSION: 1. Normal caliber abdominal aorta  without aneurysm. There is dense atherosclerosis of the aorta and its branches. 2. Mildly distended gallbladder but without pericholecystic inflammatory change. If there is clinical concern for biliary pathology, consider right upper quadrant ultrasound. 3. Colonic diverticulosis without diverticulitis.   Electronically Signed   By: Jeb Levering M.D.   On: 11/26/2014 05:47   Nm Hepato W/eject Fract  11/27/2014   CLINICAL DATA:  74 year old with nausea and abdominal pain for 2 days.  EXAM: NUCLEAR MEDICINE HEPATOBILIARY IMAGING WITH GALLBLADDER EF  TECHNIQUE: Sequential images of the abdomen were obtained out to 60 minutes following intravenous administration of radiopharmaceutical. After slow intravenous infusion of micrograms Cholecystokinin, gallbladder ejection fraction was determined.  RADIOPHARMACEUTICALS:  5.2 [employer] 123XX123 Choletec  COMPARISON:  Ultrasound 11/26/2014  FINDINGS: Radiopharmaceutical was taken up by the liver and excreted into the biliary system. There is gallbladder uptake at approximately 5 minutes. Activity is identified in the small bowel. The activity in the gallbladder continued to increase even after the CCK  administration. Ejection fraction was actually -22%. At 45 min, normal ejection fraction is greater than 40%.  The patient did not experience symptoms during CCK infusion.  IMPRESSION: The gallbladder ejection fraction was less than zero. Gallbladder uptake continued to increase even after the administration of CCK. 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   Electronically Signed   By: Markus Daft M.D.   On: 11/27/2014 09:25   US Abdomen Limited  11/26/2014   CLINICAL DATA:  Abdominal pain  EXAM: US ABDOMEN LIMITED - RIGHT UPPER QUADRANT  COMPARISON:  11/26/2014  FINDINGS: Gallbladder:  The gallbladder is well distended. Wall thickening is noted to 4.5 mm. A negative sonographic Percell Miller sign is elicited. Echogenic material is noted within the gallbladder consistent  with tumefactive sludge. No definitive gallstones are seen.  Common bile duct:  Diameter: 3.3 mm  Liver:  No focal lesion identified. Within normal limits in parenchymal echogenicity.  IMPRESSION: Gallbladder wall thickening. Tumefactive sludge is noted. In the appropriate clinical setting, these changes could represent acute cholecystitis.  No other focal abnormality is noted.   Electronically Signed   By: Inez Catalina M.D.   On: 11/26/2014 10:14   Ct Cta Abd/pel W/cm &/or W/o Cm  11/26/2014   CLINICAL DATA:  Abdominal pain radiating to both sides and into upper back. History of esophageal reflux. Hiatal hernia. Coronary artery disease.  EXAM: CTA ABDOMEN AND PELVIS wITHOUT AND WITH CONTRAST  TECHNIQUE: Multidetector CT imaging of the abdomen and pelvis was performed using the standard protocol during bolus administration of intravenous contrast. Multiplanar reconstructed images and MIPs were obtained and reviewed to evaluate the vascular anatomy.  CONTRAST:  80mL OMNIPAQUE IOHEXOL 350 MG/ML SOLN  COMPARISON:  Stone study of earlier today.  FINDINGS: Lower chest: Bibasilar atelectasis and/or scarring. Mild cardiomegaly with prior median sternotomy. No pericardial or pleural effusion.  Hepatobiliary: No focal liver lesion. Suggestion of hyperemia adjacent the gallbladder, including on image 36 of series 4.  The gallbladder is mildly distended with subtle/possible pericholecystic edema. No calcified stones. No biliary ductal dilatation.  Pancreas: Normal, without mass or ductal dilatation.  Spleen: Normal  Adrenals/Urinary Tract: Normal adrenal glands. Mildly atrophic left kidney, without hydronephrosis or hydroureter. Normal appearance of the right kidney for age with renal vascular calcifications. Normal urinary bladder.  Stomach/Bowel: Normal stomach, without wall thickening. Normal colon, appendix, and terminal ileum. Normal small bowel.  Vascular/Lymphatic: Abdominal aorta is normal in caliber, without  evidence of dissection. No significant stenosis at the celiac origin. There is mild to moderate atherosclerotic irregularity at the origin of the superior mesenteric artery. 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 There is an accessory lower pole left renal artery which is relatively diminutive.  The right renal artery demonstrates mild-to-moderate atherosclerotic narrowing at its origin. Inferior mesenteric artery is patent, as are both internal iliac arteries. Significant atherosclerotic irregularity involves the external iliac arteries and common femoral arteries bilaterally.  No abdominopelvic adenopathy.  Reproductive: Mild prostatomegaly.  Other: No significant free fluid. Prior ventral abdominal wall hernia repair.  Musculoskeletal: Advanced spondylosis, without acute osseous abnormality.  Review of the MIP images confirms the above findings.  IMPRESSION: 1. Extensive atherosclerotic irregularity throughout the aorta, without evidence of dissection. 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 2. Mild gallbladder distention with suspicion of mild pericholecystic edema. Acute cholecystitis cannot be excluded. Consider abdominal ultrasound. 3. No other explanation for patient's symptoms.  Electronically Signed   By: Abigail Miyamoto M.D.   On: 11/26/2014 07:41    Assessment & Plan:   Kennett was seen today for abdominal pain.  Diagnoses and all orders for this visit:  Atrial fibrillation Orders: -     POCT INR  Other orders -     esomeprazole (NEXIUM) 40 MG capsule; Take twice daily on an empty stomach   I have discontinued Mr. Majors omeprazole. I am also having him start on esomeprazole. Additionally, I am having him maintain his ipratropium-albuterol, budesonide, multivitamins ther. w/minerals, Vitamin D3, acetaminophen, nitroGLYCERIN, lovastatin, isosorbide-hydrALAZINE, ferrous sulfate,  aspirin EC, potassium chloride, furosemide, hydrALAZINE, albuterol, warfarin, HYDROcodone-acetaminophen, losartan, levothyroxine, and predniSONE.  Meds ordered this encounter  Medications  . DISCONTD: levothyroxine (SYNTHROID, LEVOTHROID) 100 MCG tablet    Sig: Take 100 mcg by mouth daily.    Refill:  1  . esomeprazole (NEXIUM) 40 MG capsule    Sig: Take twice daily on an empty stomach    Dispense:  60 capsule    Refill:  3     Follow-up: Return in about 3 months (around 03/22/2015).  Claretta Fraise, M.D.

## 2014-12-20 NOTE — Patient Instructions (Signed)
Discontinue your omeprazole

## 2014-12-23 ENCOUNTER — Telehealth: Payer: Self-pay | Admitting: Gastroenterology

## 2014-12-23 DIAGNOSIS — J449 Chronic obstructive pulmonary disease, unspecified: Secondary | ICD-10-CM | POA: Diagnosis not present

## 2014-12-23 DIAGNOSIS — E785 Hyperlipidemia, unspecified: Secondary | ICD-10-CM | POA: Diagnosis not present

## 2014-12-23 DIAGNOSIS — I4891 Unspecified atrial fibrillation: Secondary | ICD-10-CM | POA: Diagnosis not present

## 2014-12-23 DIAGNOSIS — I482 Chronic atrial fibrillation: Secondary | ICD-10-CM | POA: Diagnosis not present

## 2014-12-23 NOTE — Addendum Note (Signed)
Addended by: Earlene Plater on: 12/23/2014 11:25 AM   Modules accepted: Orders

## 2014-12-23 NOTE — Addendum Note (Signed)
Addended by: Marin Olp on: 12/23/2014 09:07 AM   Modules accepted: Orders

## 2014-12-23 NOTE — Telephone Encounter (Signed)
Spoke with patient and he was admitted to [facility] about 3 weeks ago. He states he is having abdominal pain when he eats. Pain is in left side and stomach at belly button. Offered OV with APP today but he cannot come. Scheduled on 12/25/14 with Alonza Bogus, PA. At 1:30 PM.

## 2014-12-24 ENCOUNTER — Other Ambulatory Visit: Payer: Self-pay | Admitting: *Deleted

## 2014-12-24 DIAGNOSIS — R945 Abnormal results of liver function studies: Principal | ICD-10-CM

## 2014-12-24 DIAGNOSIS — R7989 Other specified abnormal findings of blood chemistry: Secondary | ICD-10-CM

## 2014-12-24 LAB — LIPID PANEL
CHOL/HDL RATIO: 3 ratio (ref 0.0–5.0)
Cholesterol, Total: 137 mg/dL (ref 100–199)
HDL: 46 mg/dL (ref >39)
LDL Calculated: 64 mg/dL (ref 0–99)
Triglycerides: 137 mg/dL (ref 0–149)
VLDL Cholesterol Cal: 27 mg/dL (ref 5–40)

## 2014-12-24 LAB — CMP14+EGFR
ALK PHOS: 80 IU/L (ref 39–117)
ALT: 164 IU/L — AB (ref 0–44)
AST: 146 IU/L — ABNORMAL HIGH (ref 0–40)
Albumin/Globulin Ratio: 1.7 (ref 1.1–2.5)
Albumin: 4.1 g/dL (ref 3.5–4.8)
BUN/Creatinine Ratio: 24 — ABNORMAL HIGH (ref 10–22)
BUN: 34 mg/dL — ABNORMAL HIGH (ref 8–27)
Bilirubin Total: 0.2 mg/dL (ref 0.0–1.2)
CALCIUM: 9.2 mg/dL (ref 8.6–10.2)
CHLORIDE: 100 mmol/L (ref 97–108)
CO2: 22 mmol/L (ref 18–29)
CREATININE: 1.43 mg/dL — AB (ref 0.76–1.27)
GFR calc Af Amer: 55 mL/min/{1.73_m2} — ABNORMAL LOW (ref >59)
GFR calc non Af Amer: 48 mL/min/{1.73_m2} — ABNORMAL LOW (ref >59)
GLUCOSE: 96 mg/dL (ref 65–99)
Globulin, Total: 2.4 g/dL (ref 1.5–4.5)
POTASSIUM: 4.1 mmol/L (ref 3.5–5.2)
SODIUM: 139 mmol/L (ref 134–144)
Total Protein: 6.5 g/dL (ref 6.0–8.5)

## 2014-12-25 ENCOUNTER — Inpatient Hospital Stay (HOSPITAL_COMMUNITY): Payer: Medicare Other

## 2014-12-25 ENCOUNTER — Encounter: Payer: Self-pay | Admitting: Gastroenterology

## 2014-12-25 ENCOUNTER — Encounter (HOSPITAL_COMMUNITY): Payer: Self-pay | Admitting: Internal Medicine

## 2014-12-25 ENCOUNTER — Ambulatory Visit (INDEPENDENT_AMBULATORY_CARE_PROVIDER_SITE_OTHER): Payer: Medicare Other | Admitting: Gastroenterology

## 2014-12-25 ENCOUNTER — Emergency Department (HOSPITAL_COMMUNITY): Payer: Medicare Other

## 2014-12-25 ENCOUNTER — Inpatient Hospital Stay (HOSPITAL_COMMUNITY)
Admission: EM | Admit: 2014-12-25 | Discharge: 2014-12-31 | DRG: 854 | Disposition: A | Discharge status: Home / Self Care | Payer: Medicare Other | Attending: Internal Medicine | Admitting: Internal Medicine

## 2014-12-25 ENCOUNTER — Encounter (HOSPITAL_COMMUNITY): Payer: Self-pay | Admitting: *Deleted

## 2014-12-25 VITALS — BP 108/42 | HR 60 | Ht 70.0 in | Wt 206.0 lb

## 2014-12-25 DIAGNOSIS — K819 Cholecystitis, unspecified: Secondary | ICD-10-CM | POA: Diagnosis not present

## 2014-12-25 DIAGNOSIS — M1990 Unspecified osteoarthritis, unspecified site: Secondary | ICD-10-CM | POA: Diagnosis present

## 2014-12-25 DIAGNOSIS — Z01818 Encounter for other preprocedural examination: Secondary | ICD-10-CM | POA: Diagnosis not present

## 2014-12-25 DIAGNOSIS — N183 Chronic kidney disease, stage 3 (moderate): Secondary | ICD-10-CM | POA: Diagnosis present

## 2014-12-25 DIAGNOSIS — J449 Chronic obstructive pulmonary disease, unspecified: Secondary | ICD-10-CM | POA: Diagnosis present

## 2014-12-25 DIAGNOSIS — R634 Abnormal weight loss: Secondary | ICD-10-CM | POA: Diagnosis not present

## 2014-12-25 DIAGNOSIS — Z955 Presence of coronary angioplasty implant and graft: Secondary | ICD-10-CM | POA: Diagnosis not present

## 2014-12-25 DIAGNOSIS — Z87891 Personal history of nicotine dependence: Secondary | ICD-10-CM | POA: Diagnosis not present

## 2014-12-25 DIAGNOSIS — Z86010 Personal history of colonic polyps: Secondary | ICD-10-CM

## 2014-12-25 DIAGNOSIS — K8010 Calculus of gallbladder with chronic cholecystitis without obstruction: Secondary | ICD-10-CM | POA: Diagnosis not present

## 2014-12-25 DIAGNOSIS — I5022 Chronic systolic (congestive) heart failure: Secondary | ICD-10-CM | POA: Diagnosis not present

## 2014-12-25 DIAGNOSIS — D631 Anemia in chronic kidney disease: Secondary | ICD-10-CM | POA: Diagnosis present

## 2014-12-25 DIAGNOSIS — R945 Abnormal results of liver function studies: Secondary | ICD-10-CM

## 2014-12-25 DIAGNOSIS — M109 Gout, unspecified: Secondary | ICD-10-CM | POA: Diagnosis not present

## 2014-12-25 DIAGNOSIS — R110 Nausea: Secondary | ICD-10-CM

## 2014-12-25 DIAGNOSIS — N179 Acute kidney failure, unspecified: Secondary | ICD-10-CM | POA: Diagnosis not present

## 2014-12-25 DIAGNOSIS — R1011 Right upper quadrant pain: Secondary | ICD-10-CM

## 2014-12-25 DIAGNOSIS — Z7982 Long term (current) use of aspirin: Secondary | ICD-10-CM | POA: Diagnosis not present

## 2014-12-25 DIAGNOSIS — E860 Dehydration: Secondary | ICD-10-CM | POA: Diagnosis not present

## 2014-12-25 DIAGNOSIS — A419 Sepsis, unspecified organism: Principal | ICD-10-CM | POA: Diagnosis present

## 2014-12-25 DIAGNOSIS — J45909 Unspecified asthma, uncomplicated: Secondary | ICD-10-CM | POA: Diagnosis not present

## 2014-12-25 DIAGNOSIS — E039 Hypothyroidism, unspecified: Secondary | ICD-10-CM | POA: Diagnosis not present

## 2014-12-25 DIAGNOSIS — Z9981 Dependence on supplemental oxygen: Secondary | ICD-10-CM | POA: Diagnosis not present

## 2014-12-25 DIAGNOSIS — R7989 Other specified abnormal findings of blood chemistry: Secondary | ICD-10-CM | POA: Diagnosis not present

## 2014-12-25 DIAGNOSIS — R109 Unspecified abdominal pain: Secondary | ICD-10-CM | POA: Diagnosis not present

## 2014-12-25 DIAGNOSIS — I429 Cardiomyopathy, unspecified: Secondary | ICD-10-CM | POA: Diagnosis not present

## 2014-12-25 DIAGNOSIS — E785 Hyperlipidemia, unspecified: Secondary | ICD-10-CM | POA: Diagnosis present

## 2014-12-25 DIAGNOSIS — I2510 Atherosclerotic heart disease of native coronary artery without angina pectoris: Secondary | ICD-10-CM | POA: Diagnosis present

## 2014-12-25 DIAGNOSIS — D649 Anemia, unspecified: Secondary | ICD-10-CM | POA: Diagnosis present

## 2014-12-25 DIAGNOSIS — K219 Gastro-esophageal reflux disease without esophagitis: Secondary | ICD-10-CM | POA: Diagnosis not present

## 2014-12-25 DIAGNOSIS — R040 Epistaxis: Secondary | ICD-10-CM | POA: Diagnosis not present

## 2014-12-25 DIAGNOSIS — I129 Hypertensive chronic kidney disease with stage 1 through stage 4 chronic kidney disease, or unspecified chronic kidney disease: Secondary | ICD-10-CM | POA: Diagnosis present

## 2014-12-25 DIAGNOSIS — Z7901 Long term (current) use of anticoagulants: Secondary | ICD-10-CM

## 2014-12-25 DIAGNOSIS — K8012 Calculus of gallbladder with acute and chronic cholecystitis without obstruction: Secondary | ICD-10-CM | POA: Diagnosis present

## 2014-12-25 DIAGNOSIS — Z951 Presence of aortocoronary bypass graft: Secondary | ICD-10-CM

## 2014-12-25 DIAGNOSIS — R1031 Right lower quadrant pain: Secondary | ICD-10-CM | POA: Diagnosis not present

## 2014-12-25 DIAGNOSIS — I252 Old myocardial infarction: Secondary | ICD-10-CM

## 2014-12-25 DIAGNOSIS — K810 Acute cholecystitis: Secondary | ICD-10-CM | POA: Diagnosis not present

## 2014-12-25 DIAGNOSIS — I248 Other forms of acute ischemic heart disease: Secondary | ICD-10-CM | POA: Diagnosis not present

## 2014-12-25 DIAGNOSIS — I482 Chronic atrial fibrillation, unspecified: Secondary | ICD-10-CM | POA: Diagnosis present

## 2014-12-25 DIAGNOSIS — K828 Other specified diseases of gallbladder: Secondary | ICD-10-CM | POA: Diagnosis not present

## 2014-12-25 DIAGNOSIS — J9811 Atelectasis: Secondary | ICD-10-CM | POA: Diagnosis not present

## 2014-12-25 DIAGNOSIS — I10 Essential (primary) hypertension: Secondary | ICD-10-CM | POA: Diagnosis not present

## 2014-12-25 LAB — CBC WITH DIFFERENTIAL/PLATELET
BASOS ABS: 0.1 10*3/uL (ref 0.0–0.1)
Basophils Relative: 1 % (ref 0–1)
Eosinophils Absolute: 0.5 10*3/uL (ref 0.0–0.7)
Eosinophils Relative: 5 % (ref 0–5)
HEMATOCRIT: 37.1 % — AB (ref 39.0–52.0)
Hemoglobin: 12.1 g/dL — ABNORMAL LOW (ref 13.0–17.0)
LYMPHS ABS: 0.9 10*3/uL (ref 0.7–4.0)
Lymphocytes Relative: 8 % — ABNORMAL LOW (ref 12–46)
MCH: 30.7 pg (ref 26.0–34.0)
MCHC: 32.6 g/dL (ref 30.0–36.0)
MCV: 94.2 fL (ref 78.0–100.0)
MONO ABS: 1.5 10*3/uL — AB (ref 0.1–1.0)
Monocytes Relative: 13 % — ABNORMAL HIGH (ref 3–12)
Neutro Abs: 8.7 10*3/uL — ABNORMAL HIGH (ref 1.7–7.7)
Neutrophils Relative %: 73 % (ref 43–77)
PLATELETS: 401 10*3/uL — AB (ref 150–400)
RBC: 3.94 MIL/uL — ABNORMAL LOW (ref 4.22–5.81)
RDW: 14.7 % (ref 11.5–15.5)
WBC: 11.7 10*3/uL — ABNORMAL HIGH (ref 4.0–10.5)

## 2014-12-25 LAB — PROTIME-INR
INR: 2.52 — AB (ref 0.00–1.49)
PROTHROMBIN TIME: 27.4 s — AB (ref 11.6–15.2)

## 2014-12-25 LAB — TROPONIN I: Troponin I: 0.04 ng/mL — ABNORMAL HIGH (ref <0.031)

## 2014-12-25 LAB — LIPASE, BLOOD: Lipase: 25 U/L (ref 11–59)

## 2014-12-25 LAB — COMPREHENSIVE METABOLIC PANEL
ALBUMIN: 4 g/dL (ref 3.5–5.2)
ALK PHOS: 65 U/L (ref 39–117)
ALT: 40 U/L (ref 0–53)
AST: 26 U/L (ref 0–37)
Anion gap: 10 (ref 5–15)
BUN: 43 mg/dL — AB (ref 6–23)
CO2: 27 mmol/L (ref 19–32)
CREATININE: 2.19 mg/dL — AB (ref 0.50–1.35)
Calcium: 9.2 mg/dL (ref 8.4–10.5)
Chloride: 101 mmol/L (ref 96–112)
GFR calc Af Amer: 32 mL/min — ABNORMAL LOW (ref >90)
GFR calc non Af Amer: 28 mL/min — ABNORMAL LOW (ref >90)
Glucose, Bld: 105 mg/dL — ABNORMAL HIGH (ref 70–99)
Potassium: 5 mmol/L (ref 3.5–5.1)
Sodium: 138 mmol/L (ref 135–145)
TOTAL PROTEIN: 7.2 g/dL (ref 6.0–8.3)
Total Bilirubin: 0.5 mg/dL (ref 0.3–1.2)

## 2014-12-25 LAB — URINE MICROSCOPIC-ADD ON

## 2014-12-25 LAB — URINALYSIS, ROUTINE W REFLEX MICROSCOPIC
BILIRUBIN URINE: NEGATIVE
GLUCOSE, UA: NEGATIVE mg/dL
HGB URINE DIPSTICK: NEGATIVE
Ketones, ur: NEGATIVE mg/dL
Nitrite: NEGATIVE
PROTEIN: NEGATIVE mg/dL
Specific Gravity, Urine: 1.016 (ref 1.005–1.030)
UROBILINOGEN UA: 0.2 mg/dL (ref 0.0–1.0)
pH: 5 (ref 5.0–8.0)

## 2014-12-25 MED ORDER — ISOSORB DINITRATE-HYDRALAZINE 20-37.5 MG PO TABS
1.0000 | ORAL_TABLET | Freq: Three times a day (TID) | ORAL | Status: DC
  Administered 2014-12-25 – 2014-12-31 (×15): 1 via ORAL
  Filled 2014-12-25 (×19): qty 1

## 2014-12-25 MED ORDER — POTASSIUM CHLORIDE ER 10 MEQ PO TBCR
10.0000 meq | EXTENDED_RELEASE_TABLET | Freq: Every day | ORAL | Status: DC
  Administered 2014-12-26 – 2014-12-31 (×6): 10 meq via ORAL
  Filled 2014-12-25 (×6): qty 1

## 2014-12-25 MED ORDER — VITAMIN D3 25 MCG (1000 UNIT) PO TABS
5000.0000 [IU] | ORAL_TABLET | Freq: Every day | ORAL | Status: DC
  Administered 2014-12-26 – 2014-12-31 (×6): 5000 [IU] via ORAL
  Filled 2014-12-25 (×7): qty 5

## 2014-12-25 MED ORDER — NITROGLYCERIN 0.4 MG SL SUBL: 0.4000 mg | SUBLINGUAL_TABLET | SUBLINGUAL | Status: DC | PRN

## 2014-12-25 MED ORDER — HYDROMORPHONE HCL 1 MG/ML IJ SOLN
1.0000 mg | INTRAMUSCULAR | Status: DC | PRN
  Administered 2014-12-25 – 2014-12-31 (×17): 1 mg via INTRAVENOUS
  Filled 2014-12-25 (×17): qty 1

## 2014-12-25 MED ORDER — FERROUS SULFATE 325 (65 FE) MG PO TABS
325.0000 mg | ORAL_TABLET | Freq: Two times a day (BID) | ORAL | Status: DC
  Administered 2014-12-26 – 2014-12-31 (×10): 325 mg via ORAL
  Filled 2014-12-25 (×14): qty 1

## 2014-12-25 MED ORDER — VITAMIN D3 125 MCG (5000 UT) PO CAPS
5000.0000 [IU] | ORAL_CAPSULE | Freq: Every day | ORAL | Status: DC
Start: 2014-12-25 — End: 2014-12-25

## 2014-12-25 MED ORDER — LEVOTHYROXINE SODIUM 100 MCG PO TABS
100.0000 ug | ORAL_TABLET | Freq: Every day | ORAL | Status: DC
  Administered 2014-12-26 – 2014-12-31 (×6): 100 ug via ORAL
  Filled 2014-12-25 (×8): qty 1

## 2014-12-25 MED ORDER — SODIUM CHLORIDE 0.9 % IJ SOLN
3.0000 mL | Freq: Two times a day (BID) | INTRAMUSCULAR | Status: DC
  Administered 2014-12-25 – 2014-12-31 (×6): 3 mL via INTRAVENOUS

## 2014-12-25 MED ORDER — FUROSEMIDE 40 MG PO TABS
40.0000 mg | ORAL_TABLET | Freq: Two times a day (BID) | ORAL | Status: DC
  Administered 2014-12-26: 40 mg via ORAL
  Filled 2014-12-25 (×4): qty 1

## 2014-12-25 MED ORDER — ACETAMINOPHEN 500 MG PO TABS
1000.0000 mg | ORAL_TABLET | Freq: Four times a day (QID) | ORAL | Status: DC | PRN
  Administered 2014-12-28: 1000 mg via ORAL
  Filled 2014-12-25: qty 2

## 2014-12-25 MED ORDER — ASPIRIN EC 81 MG PO TBEC
81.0000 mg | DELAYED_RELEASE_TABLET | ORAL | Status: DC
  Administered 2014-12-27 – 2014-12-30 (×2): 81 mg via ORAL
  Filled 2014-12-25 (×3): qty 1

## 2014-12-25 MED ORDER — ALBUTEROL SULFATE (2.5 MG/3ML) 0.083% IN NEBU
3.0000 mL | INHALATION_SOLUTION | Freq: Four times a day (QID) | RESPIRATORY_TRACT | Status: DC | PRN
  Administered 2014-12-27: 3 mL via RESPIRATORY_TRACT
  Filled 2014-12-25: qty 3

## 2014-12-25 MED ORDER — BUDESONIDE 0.25 MG/2ML IN SUSP
0.2500 mg | Freq: Two times a day (BID) | RESPIRATORY_TRACT | Status: DC
  Administered 2014-12-25 – 2014-12-31 (×12): 0.25 mg via RESPIRATORY_TRACT
  Filled 2014-12-25 (×14): qty 2

## 2014-12-25 MED ORDER — HYDRALAZINE HCL 25 MG PO TABS
25.0000 mg | ORAL_TABLET | Freq: Three times a day (TID) | ORAL | Status: DC
  Administered 2014-12-25 – 2014-12-31 (×15): 25 mg via ORAL
  Filled 2014-12-25 (×19): qty 1

## 2014-12-25 MED ORDER — IPRATROPIUM-ALBUTEROL 0.5-2.5 (3) MG/3ML IN SOLN: 3.0000 mL | Freq: Four times a day (QID) | RESPIRATORY_TRACT | Status: DC | PRN

## 2014-12-25 MED ORDER — ONDANSETRON HCL 4 MG/2ML IJ SOLN
4.0000 mg | Freq: Four times a day (QID) | INTRAMUSCULAR | Status: DC | PRN
  Administered 2014-12-26: 4 mg via INTRAVENOUS
  Filled 2014-12-25: qty 2

## 2014-12-25 MED ORDER — PRAVASTATIN SODIUM 40 MG PO TABS
40.0000 mg | ORAL_TABLET | Freq: Every day | ORAL | Status: DC
  Administered 2014-12-26 – 2014-12-30 (×5): 40 mg via ORAL
  Filled 2014-12-25 (×6): qty 1

## 2014-12-25 MED ORDER — SODIUM CHLORIDE 0.9 % IV SOLN
INTRAVENOUS | Status: AC
  Administered 2014-12-25: 20:00:00 via INTRAVENOUS

## 2014-12-25 NOTE — ED Notes (Signed)
Pt reports having abd pain and back pain. Per pt, was sent here from GI office to have gallbladder removed. Having nausea, denies vomiting.

## 2014-12-25 NOTE — Progress Notes (Addendum)
     12/25/2014 GUERIN VELARDE [id number] 1941-01-19   History of Present Illness:  This is a pleasant 74 year old male who is previously known to Dr. Sharlett Iles.  Requesting Dr. Hilarie Fredrickson for new GI MD.  He presents to our office today with complaints of upper abdominal pain and nausea with a lot of belching, especially after eating for the past 3 weeks or so.  Pain is all along upper abdomen and seems to go into both sides of his back at times.  He has not been eating because he feels so terrible.  He went to [facility] where ultrasound showed gallbladder wall thickening and sludge.  HIDA scan with CCK showed 0% EF.  On 3/21 his AST was 146, ALT was 164, total bili normal at 0.2, and ALP normal at 80.  WBC count was 14.2.  He is on coumadin and had a cardiac stent placed in November.    Colonoscopy with Dr. Sharlett Iles 11/2011 was normal.  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   Current Medications, Allergies, Past Medical History, Past Surgical History, Family History and Social History were reviewed in [employer] record.   Physical Exam: BP 108/42 mmHg  Pulse 60  Ht 5\' 10"  (1.778 m)  Wt 206 lb (93.441 kg)  BMI 29.56 kg/m2 General: Well developed white male in no acute distress Head: Normocephalic and atraumatic Eyes:  Sclerae anicteric, conjunctiva pink  Ears: Normal auditory acuity Lungs: Clear throughout to auscultation Heart:  Irregularly irregular. Abdomen: Soft, non-distended.  Bowel sounds present.  Some diffuse TTP but > in the RUQ/epigastrium. Musculoskeletal: Symmetrical with no gross deformities  Extremities: No edema  Neurological: Alert oriented x 4, grossly non-focal Psychological:  Alert and cooperative. Normal mood and affect  Assessment and Recommendations: -74 year old male with 3-4 weeks of upper abdominal pain, nausea, and belching particularly after eating with ultrasound showing gallbladder wall thickening and sludge.  HIDA  scan with CCK shows EF of 0%.  Transaminases are elevated as well as WBC count.  I think that he will likely need his gallbladder removed.  He is on coumadin and had a cardiac stent placed in 08/2014.  First available outpatient appt with [facility] is 4/11.  Spoke with PA at [place] who recommended sending patient to the ED at [facility] so that he can be admitted for evaluation and treatment as well as having cardiology's assistance with the case.   Addendum: Reviewed and agree with initial management including evaluation in the ED. Jerene Bears, MD

## 2014-12-25 NOTE — H&P (Addendum)
[name] [name] is an 73 y.o. male.    Donald Moore (pcp)   Chief Complaint: abdominal pain HPI: 73 yo male with CAD s/p CABG, stent, Pafib (CHADS=2) c/o abdominal pain, sharp for the past 3 weeks. Located in the RUQ/epigastric area.  Pt notes nausea,  Tried rolaids, and ppi but didn't help.  Pt denies fever, chills, emesis, diarrhea, brbpr, black stool.  Pt went [place] 3 weeks ago, and told that he had gallbladder issues, but pain was persistent and therefore presented tonight to [facility], and found to have acute cholecystitis.   Past Medical History  Diagnosis Date  . Unspecified essential hypertension   . Anemia   . Asthma   . Hepatomegaly   . Esophageal reflux   . Other and unspecified coagulation defects   . Hiatal hernia   . Personal history of colonic polyps 05/29/2010    TUBULAR ADENOMA  . CAD (coronary artery disease)   . Hypothyroidism   . Gout   . Hyperlipidemia   . Gastric antral vascular ectasia 2013  . Atrial fibrillation   . History of blood transfusion ~ 2012    "blood count dropped; had to get 3 units"  . Arthritis     "hips; back" (07/09/2014)  . On home oxygen therapy     "2L at night" (07/09/2014)  . COPD (chronic obstructive pulmonary disease)     Past Surgical History  Procedure Laterality Date  . Umbilical hernia repair    . Coronary angioplasty with stent placement  ~ 2008; 07/08/2014    "1 + 3"  . 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 Hernia repair    . Umbilical hernia repair  1970's  . Left and right heart catheterization with coronary/graft angiogram N/A 07/09/2014    Procedure: LEFT AND RIGHT HEART CATHETERIZATION WITH CORONARY/GRAFT ANGIOGRAM;  Surgeon: Henry W Smith III, MD;  Location: [facility] CATH LAB;  Service: Cardiovascular;  Laterality: N/A;  . Colonoscopy  2013    Dr. Patterson: no polyps or evidence of active bleeding  . Esophagogastroduodenoscopy  2013    Dr. 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 Givens capsule study  2013    Dr. Patterson: AVM at 5 minutes beyond first duodenal image. If persistent IDA, bleeding, recommend enteroscopy with ablation     Family History  Problem Relation Age of Onset  . Leukemia Mother   . Kidney disease Mother     kidney removed   . Colon cancer Neg Hx   . Heart attack Brother   . Hypertension      family   . Cancer Father   . Heart disease Father   . Stomach cancer Sister   . Stomach cancer Sister   . Early death Brother    Social History:  reports that he quit smoking about 18 years ago. His smoking use included Cigarettes. He has a 42 pack-year smoking history. He has never used smokeless tobacco. He reports that he drinks about 0.6 oz of alcohol per week. He reports that he does not use illicit drugs.  Allergies: No Known Allergies Medications reviewed  (Not in a hospital admission)  Results for orders placed or performed during the hospital encounter of 12/25/14 (from the past 48 hour(s))  CBC with Differential     Status: Abnormal   Collection Time: 12/25/14  3:02 PM  Result Value Ref Range   WBC 11.7 (H) 4.0 -   10.5 K/uL   RBC 3.94 (L) 4.22 - 5.81 MIL/uL   Hemoglobin 12.1 (L) 13.0 - 17.0 g/dL   HCT 37.1 (L) 39.0 - 52.0 %   MCV 94.2 78.0 - 100.0 fL   MCH 30.7 26.0 - 34.0 pg   MCHC 32.6 30.0 - 36.0 g/dL   RDW 14.7 11.5 - 15.5 %   Platelets 401 (H) 150 - 400 K/uL   Neutrophils Relative % 73 43 - 77 %   Neutro Abs 8.7 (H) 1.7 - 7.7 K/uL   Lymphocytes Relative 8 (L) 12 - 46 %   Lymphs Abs 0.9 0.7 - 4.0 K/uL   Monocytes Relative 13 (H) 3 - 12 %   Monocytes Absolute 1.5 (H) 0.1 - 1.0 K/uL   Eosinophils Relative 5 0 - 5 %   Eosinophils Absolute 0.5 0.0 - 0.7 K/uL   Basophils Relative 1 0 - 1 %   Basophils Absolute 0.1 0.0 - 0.1 K/uL  Comprehensive metabolic panel     Status: Abnormal   Collection Time: 12/25/14  3:02 PM  Result Value Ref Range   Sodium 138 135 - 145 mmol/L   Potassium 5.0  3.5 - 5.1 mmol/L   Chloride 101 96 - 112 mmol/L   CO2 27 19 - 32 mmol/L   Glucose, Bld 105 (H) 70 - 99 mg/dL   BUN 43 (H) 6 - 23 mg/dL   Creatinine, Ser 2.19 (H) 0.50 - 1.35 mg/dL   Calcium 9.2 8.4 - 10.5 mg/dL   Total Protein 7.2 6.0 - 8.3 g/dL   Albumin 4.0 3.5 - 5.2 g/dL   AST 26 0 - 37 U/L   ALT 40 0 - 53 U/L   Alkaline Phosphatase 65 39 - 117 U/L   Total Bilirubin 0.5 0.3 - 1.2 mg/dL   GFR calc non Af Amer 28 (L) >90 mL/min   GFR calc Af Amer 32 (L) >90 mL/min    Comment: (NOTE) The eGFR has been calculated using the CKD EPI equation. This calculation has not been validated in all clinical situations. eGFR's persistently <90 mL/min signify possible Chronic Kidney Disease.    Anion gap 10 5 - 15  Lipase, blood     Status: None   Collection Time: 12/25/14  3:02 PM  Result Value Ref Range   Lipase 25 11 - 59 U/L  Protime-INR     Status: Abnormal   Collection Time: 12/25/14  3:02 PM  Result Value Ref Range   Prothrombin Time 27.4 (H) 11.6 - 15.2 seconds   INR 2.52 (H) 0.00 - 1.49  Urinalysis, Routine w reflex microscopic     Status: Abnormal   Collection Time: 12/25/14  5:49 PM  Result Value Ref Range   Color, Urine YELLOW YELLOW   APPearance CLEAR CLEAR   Specific Gravity, Urine 1.016 1.005 - 1.030   pH 5.0 5.0 - 8.0   Glucose, UA NEGATIVE NEGATIVE mg/dL   Hgb urine dipstick NEGATIVE NEGATIVE   Bilirubin Urine NEGATIVE NEGATIVE   Ketones, ur NEGATIVE NEGATIVE mg/dL   Protein, ur NEGATIVE NEGATIVE mg/dL   Urobilinogen, UA 0.2 0.0 - 1.0 mg/dL   Nitrite NEGATIVE NEGATIVE   Leukocytes, UA SMALL (A) NEGATIVE  Urine microscopic-add on     Status: None   Collection Time: 12/25/14  5:49 PM  Result Value Ref Range   Squamous Epithelial / LPF RARE RARE   WBC, UA 3-6 <3 WBC/hpf   RBC / HPF 0-2 <3 RBC/hpf   Bacteria, UA RARE  RARE   Us Abdomen Limited Ruq  12/25/2014   CLINICAL DATA:  Right upper quadrant pain  EXAM: US ABDOMEN LIMITED - RIGHT UPPER QUADRANT  COMPARISON:   11/26/14  FINDINGS: Gallbladder:  Again noted tumefactive sludge within gallbladder. There is progression of gallbladder wall thickening up to 9.8 mm. Findings are suspicious for acute cholecystitis and clinical correlation is necessary. There is positive sonographic Murphy's sign.  Common bile duct:  Diameter: 5.7 mm in diameter within normal limits.  Liver:  No focal lesion identified. Within normal limits in parenchymal echogenicity.  IMPRESSION: Again noted tumefactive sludge within gallbladder. There is progression of gallbladder wall thickening up to 9.8 mm. Findings are suspicious for acute cholecystitis and clinical correlation is necessary. There is positive sonographic Murphy's sign.   Electronically Signed   By: Liviu  Pop M.D.   On: 12/25/2014 18:28    Review of Systems  Constitutional: Negative.   HENT: Negative.   Eyes: Negative.   Respiratory: Negative.   Cardiovascular: Negative.   Gastrointestinal: Positive for nausea and abdominal pain. Negative for heartburn, vomiting, diarrhea, constipation, blood in stool and melena.  Genitourinary: Negative for dysuria, urgency, frequency, hematuria and flank pain.  Musculoskeletal: Negative for myalgias, back pain, joint pain, falls and neck pain.  Skin: Negative.   Neurological: Negative for dizziness, tingling, tremors, sensory change, speech change, focal weakness, seizures and loss of consciousness.  Endo/Heme/Allergies: Negative for environmental allergies and polydipsia. Does not bruise/bleed easily.  Psychiatric/Behavioral: Negative for depression, suicidal ideas, hallucinations, memory loss and substance abuse. The patient is not nervous/anxious and does not have insomnia.     Blood pressure 149/46, pulse 68, temperature 98.3 F (36.8 C), temperature source Oral, resp. rate 18, SpO2 93 %. Physical Exam  Constitutional: He is oriented to person, place, and time. He appears well-developed and well-nourished.  HENT:  Head:  Normocephalic and atraumatic.  Eyes: Conjunctivae and EOM are normal. Pupils are equal, round, and reactive to light. No scleral icterus.  Neck: Normal range of motion. Neck supple. No JVD present. No tracheal deviation present. No thyromegaly present.  Cardiovascular: Normal rate and regular rhythm.  Exam reveals no gallop and no friction rub.   No murmur heard. Respiratory: Effort normal and breath sounds normal. No respiratory distress. He has no wheezes. He has no rales. He exhibits no tenderness.  GI: Soft. Bowel sounds are normal. He exhibits no distension and no mass. There is tenderness. There is no rebound and no guarding.  Musculoskeletal: Normal range of motion. He exhibits no edema or tenderness.  Lymphadenopathy:    He has no cervical adenopathy.  Neurological: He is alert and oriented to person, place, and time. He has normal reflexes. He displays normal reflexes. No cranial nerve deficit. He exhibits normal muscle tone. Coordination normal.  Skin: Skin is warm and dry. No rash noted. No erythema. No pallor.  Psychiatric: He has a normal mood and affect. His behavior is normal. Judgment and thought content normal.     Assessment/Plan Abdominal pain secondary to acute cholecystitis NPO after midnite,  Clear liquid til then Ns iv Check cbc, cmp, pt, ptt, ua, CXR.  Appreciate surgery input  ARF Likely due to dehydration Ns iv  Pafib Check trop Hold coumadin Repeat INR in am, consider vitamin K 5mg po x1 if high  Anemia Check cbc in am  CAD Cont aspirin, and current medications  DVT prophylaxis  scd  Pt is medically cleared to have surgery for cholecystitis, benefits outweigh the risk   at this time.     KIM, JAMES 12/25/2014, 7:12 PM    

## 2014-12-25 NOTE — ED Notes (Signed)
Pt in ultrasound

## 2014-12-25 NOTE — ED Provider Notes (Signed)
CSN: [id number]     Arrival date & time 12/25/14  1421 History   First MD Initiated Contact with Patient 12/25/14 1628     Chief Complaint  Patient presents with  . Abdominal Pain     (Consider location/radiation/quality/duration/timing/severity/associated sxs/prior Treatment) HPI Comments: [name] [name] is a 74 y.o. male with a PMHx of HTN, CAD, afib, COPD, HLD, anemia, asthma, GERD, hiatal hernia, colonic polyps/tubular adenoma, hypothyroidism, and gout, who presents to the ED after being sent here by [facility], saw Alonza Bogus PA-C today who wrote in her note "74 year old male with 3-4 weeks of upper abdominal pain, nausea, and belching particularly after eating with ultrasound showing gallbladder wall thickening and sludge. HIDA scan with CCK shows EF of 0%. Transaminases are elevated as well as WBC count. I think that he will likely need his gallbladder removed. He is on coumadin and had a cardiac stent placed in 08/2014. First available outpatient appt with [facility] is 4/11. Spoke with PA at [facility] who recommended sending patient to the ED at [facility] so that he can be admitted for evaluation and treatment as well as having cardiology's assistance with the case."   Pt confirms this history. Reports his pain is 4/10 at this time, typically in the RUQ but occasionally it radiates into his back and sometimes into the LUQ. Pain is aching, constant, gradually worsening over the last 3 weeks, worse with eating, and improved with the pain pills given to him although he's unsure what medication it is. Endorses associated nausea without emesis, and belching, both which occur after eating. He also reports he hasn't had a BM in 3 days, but states he hasn't been eating as much lately. Denies fevers, chills, CP, SOB, vomiting, hematemesis, diarrhea, melena, hematochezia, rectal pain, obstipation, dysuria, hematuria, flank pain, myalgias, arthralgias, rashes, weakness, numbness, or tingling. He sees Dr. Daneen Schick  for cardiology, and Dr. [name] at [facility] for PCP. Compliant on all meds.  Patient is a 74 y.o. male presenting with abdominal pain. The history is provided by the patient. No language interpreter was used.  Abdominal Pain Pain location:  RUQ Pain quality: aching   Pain radiates to:  Back Pain severity:  Mild Onset quality:  Gradual Duration:  3 weeks Timing:  Constant Progression:  Worsening Chronicity:  New Context: not sick contacts and not suspicious food intake   Relieved by: pain pills given by dr, unsure what med. Worsened by:  Eating Ineffective treatments:  None tried Associated symptoms: belching, constipation (no BM in 2 days) and nausea   Associated symptoms: no chest pain, no chills, no diarrhea, no dysuria, no fever, no flatus, no hematemesis, no hematochezia, no hematuria, no melena, no shortness of breath and no vomiting     Past Medical History  Diagnosis Date  . Unspecified essential hypertension   . Anemia   . Asthma   . Hepatomegaly   . Esophageal reflux   . Other and unspecified coagulation defects   . Hiatal hernia   . Personal history of colonic polyps 05/29/2010    TUBULAR ADENOMA  . CAD (coronary artery disease)   . Hypothyroidism   . Gout   . Hyperlipidemia   . Gastric antral vascular ectasia 2013  . Atrial fibrillation   . History of blood transfusion ~ 2012    "blood count dropped; had to get 3 units"  . Arthritis     "hips; back" (07/09/2014)  . On home oxygen therapy     "  2L at night" (07/09/2014)  . COPD (chronic obstructive pulmonary disease)    Past Surgical History  Procedure Laterality Date  . Umbilical hernia repair    . Coronary angioplasty with stent placement  ~ 2008; 07/08/2014    "1 + 3"  . 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 Hernia repair    . Umbilical hernia repair  1970's  . Left and right heart catheterization with coronary/graft angiogram N/A 07/09/2014    Procedure: LEFT  AND RIGHT HEART CATHETERIZATION WITH Beatrix Fetters;  Surgeon: Sinclair Grooms, MD;  Location: [facility] CATH LAB;  Service: Cardiovascular;  Laterality: N/A;  . Colonoscopy  2013    Dr. Sharlett Iles: no polyps or evidence of active bleeding  . Esophagogastroduodenoscopy  2013    Dr. 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 Givens capsule study  2013    Dr. Sharlett Iles: AVM at 5 minutes beyond first duodenal image. If persistent IDA, bleeding, recommend enteroscopy with ablation    Family History  Problem Relation Age of Onset  . Leukemia Mother   . Kidney disease Mother     kidney removed   . Colon cancer Neg Hx   . Heart attack Brother   . Hypertension      family   . Cancer Father   . Heart disease Father   . Stomach cancer Sister   . Stomach cancer Sister   . Early death Brother    History  Substance Use Topics  . Smoking status: Former Smoker -- 1.00 packs/day for 42 years    Types: Cigarettes    Quit date: 12/16/1996  . Smokeless tobacco: Never Used  . Alcohol Use: 0.6 oz/week    1 Cans of beer per week     Comment: 07/09/2014 "averages < 1 beer/wk", 11/26/14: denies ETOH    Review of Systems  Constitutional: Negative for fever and chills.  Respiratory: Negative for shortness of breath.   Cardiovascular: Negative for chest pain.  Gastrointestinal: Positive for nausea, abdominal pain (RUQ) and constipation (no BM in 2 days). Negative for vomiting, diarrhea, blood in stool, melena, hematochezia, anal bleeding, rectal pain, flatus and hematemesis.  Genitourinary: Negative for dysuria, hematuria and flank pain.  Musculoskeletal: Negative for myalgias and arthralgias.  Skin: Negative for color change.  Neurological: Negative for weakness and numbness.  Hematological: Bruises/bleeds easily (on coumadin).  Psychiatric/Behavioral: Negative for confusion.   10 Systems reviewed and are negative for acute change except as noted in  the HPI.    Allergies  Review of patient's allergies indicates no known allergies.  Home Medications   Prior to Admission medications   Medication Sig Start Date End Date Taking? Authorizing Provider  acetaminophen (TYLENOL) 500 MG tablet Take 1,000 mg by mouth every 6 (six) hours as needed. For pain.    Yes Historical Provider, MD  albuterol (PROVENTIL HFA;VENTOLIN HFA) 108 (90 BASE) MCG/ACT inhaler Inhale 2 puffs into the lungs every 6 (six) hours as needed. For shortness of breath. 11/22/14  Yes Chipper Herb, MD  aspirin EC 81 MG tablet Take 1 tablet (81 mg total) by mouth every Monday, Wednesday, and Friday. 10/08/14  Yes Belva Crome, MD  budesonide (PULMICORT) 0.25 MG/2ML nebulizer solution Take 0.25 mg by nebulization 2 (two) times daily.    Yes Historical Provider, MD  Cholecalciferol (VITAMIN D3) 5000 UNITS CAPS Take 5,000 Units by mouth daily.    Yes Historical Provider, MD  esomeprazole (NEXIUM) 40 MG capsule Take twice daily on an empty stomach 12/20/14  Yes Claretta Fraise, MD  ferrous sulfate 325 (65 FE) MG tablet Take 325 mg by mouth 2 (two) times daily with a meal.    Yes Historical Provider, MD  furosemide (LASIX) 40 MG tablet Take 1 tablet (40 mg total) by mouth 2 (two) times daily. 10/17/14  Yes Belva Crome, MD  hydrALAZINE (APRESOLINE) 25 MG tablet Take 1 tablet (25 mg total) by mouth 3 (three) times daily. 10/30/14  Yes Belva Crome, MD  HYDROcodone-acetaminophen (NORCO/VICODIN) 5-325 MG per tablet Take 1 tablet by mouth every 6 (six) hours as needed for moderate pain. 11/28/14  Yes Lezlie Octave Black, NP  ipratropium-albuterol (DUONEB) 0.5-2.5 (3) MG/3ML SOLN Take 3 mLs by nebulization every 6 (six) hours as needed. For shortness of breath.    Yes Historical Provider, MD  isosorbide-hydrALAZINE (BIDIL) 20-37.5 MG per tablet Take 1 tablet by mouth 3 (three) times daily. 06/25/14  Yes Liliane Shi, PA-C  levothyroxine (SYNTHROID, LEVOTHROID) 112 MCG tablet Take 1 tablet (112 mcg  total) by mouth daily before breakfast. 12/12/14  Yes Claretta Fraise, MD  lovastatin (MEVACOR) 40 MG tablet Take 1 tablet (40 mg total) by mouth at bedtime. 12/07/13  Yes Belva Crome, MD  Multiple Vitamins-Minerals (MULTIVITAMINS THER. W/MINERALS) TABS Take 1 tablet by mouth daily.     Yes Historical Provider, MD  nitroGLYCERIN (NITROSTAT) 0.4 MG SL tablet Place 0.4 mg under the tongue every 5 (five) minutes as needed for chest pain.   Yes Historical Provider, MD  potassium chloride (K-DUR) 10 MEQ tablet Take 1 tablet (10 mEq total) by mouth daily. 10/09/14  Yes Belva Crome, MD  warfarin (COUMADIN) 5 MG tablet Take 2.5-5 mg by mouth daily. Take 5 mg Monday, Tuesday, Wednesday, Friday, and Sat. Take 2.5 mg on Thursday and Sunday   Yes Historical Provider, MD   BP 143/59 mmHg  Pulse 83  Temp(Src) 98.3 F (36.8 C) (Oral)  Resp 16  SpO2 95% Physical Exam  Constitutional: He is oriented to person, place, and time. Vital signs are normal. He appears well-developed and well-nourished.  Non-toxic appearance. No distress.  Afebrile, nontoxic, NAD  HENT:  Head: Normocephalic and atraumatic.  Mouth/Throat: Oropharynx is clear and moist and mucous membranes are normal.  Eyes: Conjunctivae and EOM are normal. Right eye exhibits no discharge. Left eye exhibits no discharge.  Neck: Normal range of motion. Neck supple.  Cardiovascular: Normal rate, regular rhythm, normal heart sounds and intact distal pulses.  Exam reveals no gallop and no friction rub.   No murmur heard. Pulmonary/Chest: Effort normal and breath sounds normal. No respiratory distress. He has no decreased breath sounds. He has no wheezes. He has no rhonchi. He has no rales.  Abdominal: Soft. Normal appearance and bowel sounds are normal. He exhibits no distension. There is tenderness in the right upper quadrant. There is positive Murphy's sign. There is no rigidity, no rebound, no guarding, no CVA tenderness and no tenderness at McBurney's  point.    Soft, round obese abdomen without obvious distension, +BS throughout, with mild RUQ TTP, no r/g/r, +murphy's, neg mcburney's, no CVA TTP   Musculoskeletal: Normal range of motion.  Neurological: He is alert and oriented to person, place, and time. He has normal strength. No sensory deficit.  Skin: Skin is warm, dry and intact. No rash noted.  Psychiatric: He has a normal mood and affect.  Nursing note and vitals reviewed.  ED Course  Procedures (including critical care time) Labs Review Labs Reviewed  CBC WITH DIFFERENTIAL/PLATELET - Abnormal; Notable for the following:    WBC 11.7 (*)    RBC 3.94 (*)    Hemoglobin 12.1 (*)    HCT 37.1 (*)    Platelets 401 (*)    Neutro Abs 8.7 (*)    Lymphocytes Relative 8 (*)    Monocytes Relative 13 (*)    Monocytes Absolute 1.5 (*)    All other components within normal limits  COMPREHENSIVE METABOLIC PANEL - Abnormal; Notable for the following:    Glucose, Bld 105 (*)    BUN 43 (*)    Creatinine, Ser 2.19 (*)    GFR calc non Af Amer 28 (*)    GFR calc Af Amer 32 (*)    All other components within normal limits  PROTIME-INR - Abnormal; Notable for the following:    Prothrombin Time 27.4 (*)    INR 2.52 (*)    All other components within normal limits  LIPASE, BLOOD  URINALYSIS, ROUTINE W REFLEX MICROSCOPIC    Imaging Review US Abdomen Limited Ruq  12/25/2014   CLINICAL DATA:  Right upper quadrant pain  EXAM: US ABDOMEN LIMITED - RIGHT UPPER QUADRANT  COMPARISON:  11/26/14  FINDINGS: Gallbladder:  Again noted tumefactive sludge within gallbladder. There is progression of gallbladder wall thickening up to 9.8 mm. Findings are suspicious for acute cholecystitis and clinical correlation is necessary. There is positive sonographic Murphy's sign.  Common bile duct:  Diameter: 5.7 mm in diameter within normal limits.  Liver:  No focal lesion identified. Within normal limits in parenchymal echogenicity.  IMPRESSION: Again noted  tumefactive sludge within gallbladder. There is progression of gallbladder wall thickening up to 9.8 mm. Findings are suspicious for acute cholecystitis and clinical correlation is necessary. There is positive sonographic Murphy's sign.   Electronically Signed   By: Lahoma Crocker M.D.   On: 12/25/2014 18:28    11/26/14 US ABDOMEN LIMITED - RIGHT UPPER QUADRANT  COMPARISON: 11/26/2014  FINDINGS: Gallbladder: The gallbladder is well distended. Wall thickening is noted to 4.5 mm. A negative sonographic Percell Miller sign is elicited. Echogenic material is noted within the gallbladder consistent with tumefactive sludge. No definitive gallstones are seen.  Common bile duct: Diameter: 3.3 mm  Liver: No focal lesion identified. Within normal limits in parenchymal echogenicity.  IMPRESSION: Gallbladder wall thickening. Tumefactive sludge is noted. In the appropriate clinical setting, these changes could represent acute cholecystitis.  No other focal abnormality is noted.  Electronically Signed  By: Inez Catalina M.D.  On: 11/26/2014 10:14   11/27/14 NUCLEAR MEDICINE HEPATOBILIARY IMAGING WITH GALLBLADDER EF  TECHNIQUE: Sequential images of the abdomen were obtained out to 60 minutes following intravenous administration of radiopharmaceutical. After slow intravenous infusion of micrograms Cholecystokinin, gallbladder ejection fraction was determined.  RADIOPHARMACEUTICALS: 5.2 [place] 123XX123 Choletec  COMPARISON: Ultrasound 11/26/2014  FINDINGS: Radiopharmaceutical was taken up by the liver and excreted into the biliary system. There is gallbladder uptake at approximately 5 minutes. Activity is identified in the small bowel. The activity in the gallbladder continued to increase even after the CCK administration. Ejection fraction was actually -22%. At 45 min, normal ejection fraction is greater than 40%.  The patient did not experience symptoms during CCK  infusion.  IMPRESSION: The gallbladder ejection fraction was less than zero. Gallbladder uptake continued to increase even after the administration of CCK. 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   Electronically Signed  By: Markus Daft  M.D.  On: 11/27/2014 09:25     EKG Interpretation None      MDM   Final diagnoses:  RUQ abdominal pain  Acute cholecystitis    74 y.o. male sent here from [place] for removal of gallbladder, elevated WBC and transaminases, elevated kidney function studies, on coumadin with recent cardiac stent, note from Alonza Bogus PA-C at [facility] states they called [facility] who asked that he come here for evaluation and admission to remove gallbladder with cardiology assistance on case. Pt states his pain is currently manageable since taking pain pill prior to arrival. Mild RUQ tenderness noted on exam, +murphy's sign. Review of HIDA scan shows EF 0% with sludge. Will obtain U/A and INR, and consult [facility] to see if they'd like to admit pt vs admit via medicine service. Will monitor.   4:57 PM Dr. Rosendo Gros with [facility] returning page, would like to have another U/S today, and hold on abx until that returns. Would like medical admission once results return.  6:36 PM Dr. Maudie Mercury of [facility] returning page, agrees to admit pt. Of note, INR 2.52. U/A pending. U/S returning showing progression of gallbladder wall thickening up to 9.58mm. [facility] will follow while pt admitted, and manage him while inpatient. Admission orders placed. Please see Dr. Julianne Rice note for ongoing documentation of care.  BP 150/58 mmHg  Pulse 57  Temp(Src) 98.3 F (36.8 C) (Oral)  Resp 18  SpO2 96%    Felita Bump Camprubi-Soms, PA-C 12/25/14 1838  Charlesetta Shanks, MD 12/25/14 2134

## 2014-12-26 DIAGNOSIS — Z01818 Encounter for other preprocedural examination: Secondary | ICD-10-CM

## 2014-12-26 LAB — CBC WITH DIFFERENTIAL/PLATELET
Basophils Absolute: 0.1 10*3/uL (ref 0.0–0.1)
Basophils Relative: 1 % (ref 0–1)
EOS PCT: 8 % — AB (ref 0–5)
Eosinophils Absolute: 0.8 10*3/uL — ABNORMAL HIGH (ref 0.0–0.7)
HEMATOCRIT: 33.5 % — AB (ref 39.0–52.0)
HEMOGLOBIN: 10.9 g/dL — AB (ref 13.0–17.0)
LYMPHS PCT: 10 % — AB (ref 12–46)
Lymphs Abs: 1 10*3/uL (ref 0.7–4.0)
MCH: 31.1 pg (ref 26.0–34.0)
MCHC: 32.5 g/dL (ref 30.0–36.0)
MCV: 95.4 fL (ref 78.0–100.0)
MONO ABS: 1.6 10*3/uL — AB (ref 0.1–1.0)
MONOS PCT: 16 % — AB (ref 3–12)
Neutro Abs: 6.8 10*3/uL (ref 1.7–7.7)
Neutrophils Relative %: 65 % (ref 43–77)
Platelets: 331 10*3/uL (ref 150–400)
RBC: 3.51 MIL/uL — AB (ref 4.22–5.81)
RDW: 14.8 % (ref 11.5–15.5)
WBC: 10.3 10*3/uL (ref 4.0–10.5)

## 2014-12-26 LAB — COMPREHENSIVE METABOLIC PANEL
ALBUMIN: 3.3 g/dL — AB (ref 3.5–5.2)
ALT: 33 U/L (ref 0–53)
ANION GAP: 9 (ref 5–15)
AST: 25 U/L (ref 0–37)
Alkaline Phosphatase: 55 U/L (ref 39–117)
BUN: 41 mg/dL — ABNORMAL HIGH (ref 6–23)
CALCIUM: 8.6 mg/dL (ref 8.4–10.5)
CO2: 24 mmol/L (ref 19–32)
Chloride: 104 mmol/L (ref 96–112)
Creatinine, Ser: 1.94 mg/dL — ABNORMAL HIGH (ref 0.50–1.35)
GFR calc Af Amer: 37 mL/min — ABNORMAL LOW (ref >90)
GFR calc non Af Amer: 32 mL/min — ABNORMAL LOW (ref >90)
Glucose, Bld: 92 mg/dL (ref 70–99)
POTASSIUM: 4.4 mmol/L (ref 3.5–5.1)
SODIUM: 137 mmol/L (ref 135–145)
TOTAL PROTEIN: 6.2 g/dL (ref 6.0–8.3)
Total Bilirubin: 0.7 mg/dL (ref 0.3–1.2)

## 2014-12-26 LAB — PROTIME-INR
INR: 2.53 — AB (ref 0.00–1.49)
INR: 2.18 — AB (ref 0.00–1.49)
PROTHROMBIN TIME: 27.5 s — AB (ref 11.6–15.2)
PROTHROMBIN TIME: 24.5 s — AB (ref 11.6–15.2)

## 2014-12-26 LAB — LACTIC ACID, PLASMA: LACTIC ACID, VENOUS: 1.2 mmol/L (ref 0.5–2.0)

## 2014-12-26 LAB — ABO/RH: ABO/RH(D): A POS

## 2014-12-26 MED ORDER — SODIUM CHLORIDE 0.9 % IV SOLN
INTRAVENOUS | Status: AC
  Administered 2014-12-26: 16:00:00 via INTRAVENOUS

## 2014-12-26 MED ORDER — PIPERACILLIN-TAZOBACTAM 3.375 G IVPB
3.3750 g | Freq: Three times a day (TID) | INTRAVENOUS | Status: DC
  Administered 2014-12-27 – 2014-12-31 (×13): 3.375 g via INTRAVENOUS
  Filled 2014-12-26 (×18): qty 50

## 2014-12-26 MED ORDER — SODIUM CHLORIDE 0.9 % IV SOLN
Freq: Once | INTRAVENOUS | Status: AC
  Administered 2014-12-26: 14:00:00 via INTRAVENOUS

## 2014-12-26 NOTE — Consult Note (Signed)
Reason for Consult:  Cholecystitis/cholelithiasis Referring Physician: Sallye Ober  [name] [name] is an 74 y.o. male.  HPI:  [name] [name] with multiple medical issues presents with 3 weeks fo abdominal pain RUQ and epigastric area.  He was seen at [facility] 3 weeks ago and found to have Sludge in the gallbladder, CBD was 3.3 mm, thickened GB wall.  HiDA scan The gallbladder ejection fraction was less than zero. Gallbladder uptake continued to increase even after the administration of CCK. 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 He was treated with antibiotics and symptoms improved. Surgery and GI evaluations did not recommend surgery until he was cleared by Cardiology.  He returned to his PCP and was sent to see DR. Pyrtle on 12/25/14 for follow up.  He reports chronic pain along the upper abdomen going to the back and both sides.  He is eating less because of the pain. 17 pound weight loss over the last 3-4 weeks.  Labs show a bump in LFT on 3/21, but better yesterday.  His creatinine is up also.   It was GI's opinion he needs to undergo cholecystectomy.  He was admitted thru the ED, for Medical/Cardiac clearance and evaluation for cholecystectomy. Repeat US yesterday shows:  tumefactive sludge within gallbladder. There is progression of gallbladder wall thickening up to 9.8 mm. Findings are suspicious for acute cholecystitis and clinical correlation is necessary. There is positive sonographic Murphy's sign.    Past Medical History  Diagnosis Date  CAD, prior MI & CABG 1998;  stenting last year to prior grafts   Cardiomyopathy   Chronic atrial fibrillation on coumadin.   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 Umbilical hernia repair    . Coronary angioplasty with stent placement  ~ 2008; 07/08/2014    "1 + 3"  . 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 Hernia repair    . Umbilical hernia repair  1970's  . Left and right heart catheterization with coronary/graft angiogram N/A 07/09/2014    Procedure: LEFT AND RIGHT HEART CATHETERIZATION WITH Beatrix Fetters;  Surgeon: Sinclair Grooms, MD;  Location: [facility] CATH LAB;  Service: Cardiovascular;  Laterality: N/A;  . Colonoscopy  2013    Dr. Sharlett Iles: no polyps or evidence of active bleeding  . Esophagogastroduodenoscopy  2013    Dr. 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 Givens capsule study  2013    Dr. Sharlett Iles: AVM at 5 minutes beyond first duodenal image. If persistent IDA, bleeding, recommend enteroscopy with ablation     Family History  Problem Relation Age of Onset  . Leukemia Mother   . Kidney disease Mother     kidney removed   . Colon cancer Neg Hx   . Heart attack Brother   . Hypertension      family   . Cancer Father   . Heart disease Father   . Stomach cancer Sister   . Stomach cancer Sister   . Early death Brother     Social History:  reports that he quit smoking about 18 years ago.  His smoking use included Cigarettes. He has a 42 pack-year smoking history. He has never used smokeless tobacco. He reports that he drinks about 0.6 oz of alcohol per week. He reports that he does not use illicit drugs.  Allergies: No Known Allergies  Prior to Admission medications   Medication Sig Start Date End Date Taking? Authorizing Provider  acetaminophen (TYLENOL) 500 MG tablet Take 1,000 mg by mouth every 6 (six) hours as needed. For pain.    Yes Historical Provider, MD  albuterol (PROVENTIL HFA;VENTOLIN HFA) 108 (90 BASE) MCG/ACT inhaler Inhale 2 puffs into the lungs every 6 (six) hours as needed. For shortness of  breath. 11/22/14  Yes Chipper Herb, MD  aspirin EC 81 MG tablet Take 1 tablet (81 mg total) by mouth every Monday, Wednesday, and Friday. 10/08/14  Yes Belva Crome, MD  budesonide (PULMICORT) 0.25 MG/2ML nebulizer solution Take 0.25 mg by nebulization 2 (two) times daily.    Yes Historical Provider, MD  Cholecalciferol (VITAMIN D3) 5000 UNITS CAPS Take 5,000 Units by mouth daily.    Yes Historical Provider, MD  esomeprazole (NEXIUM) 40 MG capsule Take twice daily on an empty stomach 12/20/14  Yes Claretta Fraise, MD  ferrous sulfate 325 (65 FE) MG tablet Take 325 mg by mouth 2 (two) times daily with a meal.    Yes Historical Provider, MD  furosemide (LASIX) 40 MG tablet Take 1 tablet (40 mg total) by mouth 2 (two) times daily. 10/17/14  Yes Belva Crome, MD  hydrALAZINE (APRESOLINE) 25 MG tablet Take 1 tablet (25 mg total) by mouth 3 (three) times daily. 10/30/14  Yes Belva Crome, MD  HYDROcodone-acetaminophen (NORCO/VICODIN) 5-325 MG per tablet Take 1 tablet by mouth every 6 (six) hours as needed for moderate pain. 11/28/14  Yes Lezlie Octave Black, NP  ipratropium-albuterol (DUONEB) 0.5-2.5 (3) MG/3ML SOLN Take 3 mLs by nebulization every 6 (six) hours as needed. For shortness of breath.    Yes Historical Provider, MD  isosorbide-hydrALAZINE (BIDIL) 20-37.5 MG per tablet Take 1 tablet by mouth 3 (three) times daily. 06/25/14  Yes Liliane Shi, PA-C  levothyroxine (SYNTHROID, LEVOTHROID) 112 MCG tablet Take 1 tablet (112 mcg total) by mouth daily before breakfast. 12/12/14  Yes Claretta Fraise, MD  lovastatin (MEVACOR) 40 MG tablet Take 1 tablet (40 mg total) by mouth at bedtime. 12/07/13  Yes Belva Crome, MD  Multiple Vitamins-Minerals (MULTIVITAMINS THER. W/MINERALS) TABS Take 1 tablet by mouth daily.     Yes Historical Provider, MD  nitroGLYCERIN (NITROSTAT) 0.4 MG SL tablet Place 0.4 mg under the tongue every 5 (five) minutes as needed for chest pain.   Yes Historical Provider, MD  potassium chloride  (K-DUR) 10 MEQ tablet Take 1 tablet (10 mEq total) by mouth daily. 10/09/14  Yes Belva Crome, MD  warfarin (COUMADIN) 5 MG tablet Take 2.5-5 mg by mouth daily. Take 5 mg Monday, Tuesday, Wednesday, Friday, and Sat. Take 2.5 mg on Thursday and Sunday   Yes Historical Provider, MD     Results for orders placed or performed during the hospital encounter of 12/25/14 (from the past 48 hour(s))  CBC with Differential     Status: Abnormal   Collection Time: 12/25/14  3:02 PM  Result Value Ref Range   WBC 11.7 (H) 4.0 - 10.5 K/uL   RBC 3.94 (L) 4.22 - 5.81 MIL/uL   Hemoglobin 12.1 (L) 13.0 - 17.0 g/dL   HCT 37.1 (L) 39.0 - 52.0 %  MCV 94.2 78.0 - 100.0 fL   MCH 30.7 26.0 - 34.0 pg   MCHC 32.6 30.0 - 36.0 g/dL   RDW 14.7 11.5 - 15.5 %   Platelets 401 (H) 150 - 400 K/uL   Neutrophils Relative % 73 43 - 77 %   Neutro Abs 8.7 (H) 1.7 - 7.7 K/uL   Lymphocytes Relative 8 (L) 12 - 46 %   Lymphs Abs 0.9 0.7 - 4.0 K/uL   Monocytes Relative 13 (H) 3 - 12 %   Monocytes Absolute 1.5 (H) 0.1 - 1.0 K/uL   Eosinophils Relative 5 0 - 5 %   Eosinophils Absolute 0.5 0.0 - 0.7 K/uL   Basophils Relative 1 0 - 1 %   Basophils Absolute 0.1 0.0 - 0.1 K/uL  Comprehensive metabolic panel     Status: Abnormal   Collection Time: 12/25/14  3:02 PM  Result Value Ref Range   Sodium 138 135 - 145 mmol/L   Potassium 5.0 3.5 - 5.1 mmol/L   Chloride 101 96 - 112 mmol/L   CO2 27 19 - 32 mmol/L   Glucose, Bld 105 (H) 70 - 99 mg/dL   BUN 43 (H) 6 - 23 mg/dL   Creatinine, Ser 2.19 (H) 0.50 - 1.35 mg/dL   Calcium 9.2 8.4 - 10.5 mg/dL   Total Protein 7.2 6.0 - 8.3 g/dL   Albumin 4.0 3.5 - 5.2 g/dL   AST 26 0 - 37 U/L   ALT 40 0 - 53 U/L   Alkaline Phosphatase 65 39 - 117 U/L   Total Bilirubin 0.5 0.3 - 1.2 mg/dL   GFR calc non Af Amer 28 (L) >90 mL/min   GFR calc Af Amer 32 (L) >90 mL/min    Comment: (NOTE) The eGFR has been calculated using the CKD EPI equation. This calculation has not been validated in all  clinical situations. eGFR's persistently <90 mL/min signify possible Chronic Kidney Disease.    Anion gap 10 5 - 15  Lipase, blood     Status: None   Collection Time: 12/25/14  3:02 PM  Result Value Ref Range   Lipase 25 11 - 59 U/L  Protime-INR     Status: Abnormal   Collection Time: 12/25/14  3:02 PM  Result Value Ref Range   Prothrombin Time 27.4 (H) 11.6 - 15.2 seconds   INR 2.52 (H) 0.00 - 1.49  Urinalysis, Routine w reflex microscopic     Status: Abnormal   Collection Time: 12/25/14  5:49 PM  Result Value Ref Range   Color, Urine YELLOW YELLOW   APPearance CLEAR CLEAR   Specific Gravity, Urine 1.016 1.005 - 1.030   pH 5.0 5.0 - 8.0   Glucose, UA NEGATIVE NEGATIVE mg/dL   Hgb urine dipstick NEGATIVE NEGATIVE   Bilirubin Urine NEGATIVE NEGATIVE   Ketones, ur NEGATIVE NEGATIVE mg/dL   Protein, ur NEGATIVE NEGATIVE mg/dL   Urobilinogen, UA 0.2 0.0 - 1.0 mg/dL   Nitrite NEGATIVE NEGATIVE   Leukocytes, UA SMALL (A) NEGATIVE  Urine microscopic-add on     Status: None   Collection Time: 12/25/14  5:49 PM  Result Value Ref Range   Squamous Epithelial / LPF RARE RARE   WBC, UA 3-6 <3 WBC/hpf   RBC / HPF 0-2 <3 RBC/hpf   Bacteria, UA RARE RARE  Troponin I     Status: Abnormal   Collection Time: 12/25/14  8:31 PM  Result Value Ref Range   Troponin I 0.04 (H) <0.031 ng/mL  Comment:        PERSISTENTLY INCREASED TROPONIN VALUES IN THE RANGE OF 0.04-0.49 ng/mL CAN BE SEEN IN:       -UNSTABLE ANGINA       -CONGESTIVE HEART FAILURE       -MYOCARDITIS       -CHEST TRAUMA       -ARRYHTHMIAS       -LATE PRESENTING MYOCARDIAL INFARCTION       -COPD   CLINICAL FOLLOW-UP RECOMMENDED.   CBC with Differential/Platelet     Status: Abnormal   Collection Time: 12/26/14  7:07 AM  Result Value Ref Range   WBC 10.3 4.0 - 10.5 K/uL   RBC 3.51 (L) 4.22 - 5.81 MIL/uL   Hemoglobin 10.9 (L) 13.0 - 17.0 g/dL   HCT 33.5 (L) 39.0 - 52.0 %   MCV 95.4 78.0 - 100.0 fL   MCH 31.1 26.0 -  34.0 pg   MCHC 32.5 30.0 - 36.0 g/dL   RDW 14.8 11.5 - 15.5 %   Platelets 331 150 - 400 K/uL   Neutrophils Relative % 65 43 - 77 %   Neutro Abs 6.8 1.7 - 7.7 K/uL   Lymphocytes Relative 10 (L) 12 - 46 %   Lymphs Abs 1.0 0.7 - 4.0 K/uL   Monocytes Relative 16 (H) 3 - 12 %   Monocytes Absolute 1.6 (H) 0.1 - 1.0 K/uL   Eosinophils Relative 8 (H) 0 - 5 %   Eosinophils Absolute 0.8 (H) 0.0 - 0.7 K/uL   Basophils Relative 1 0 - 1 %   Basophils Absolute 0.1 0.0 - 0.1 K/uL  Comprehensive metabolic panel     Status: Abnormal   Collection Time: 12/26/14  7:07 AM  Result Value Ref Range   Sodium 137 135 - 145 mmol/L   Potassium 4.4 3.5 - 5.1 mmol/L   Chloride 104 96 - 112 mmol/L   CO2 24 19 - 32 mmol/L   Glucose, Bld 92 70 - 99 mg/dL   BUN 41 (H) 6 - 23 mg/dL   Creatinine, Ser 1.94 (H) 0.50 - 1.35 mg/dL   Calcium 8.6 8.4 - 10.5 mg/dL   Total Protein 6.2 6.0 - 8.3 g/dL   Albumin 3.3 (L) 3.5 - 5.2 g/dL   AST 25 0 - 37 U/L   ALT 33 0 - 53 U/L   Alkaline Phosphatase 55 39 - 117 U/L   Total Bilirubin 0.7 0.3 - 1.2 mg/dL   GFR calc non Af Amer 32 (L) >90 mL/min   GFR calc Af Amer 37 (L) >90 mL/min    Comment: (NOTE) The eGFR has been calculated using the CKD EPI equation. This calculation has not been validated in all clinical situations. eGFR's persistently <90 mL/min signify possible Chronic Kidney Disease.    Anion gap 9 5 - 15  Protime-INR     Status: Abnormal   Collection Time: 12/26/14  7:07 AM  Result Value Ref Range   Prothrombin Time 27.5 (H) 11.6 - 15.2 seconds   INR 2.53 (H) 0.00 - 1.49    Dg Chest 2 View  12/26/2014   CLINICAL DATA:  Acute onset of nausea. Preoperative chest radiograph for gallbladder surgery. Initial encounter.  EXAM: CHEST  2 VIEW  COMPARISON:  Chest radiograph performed 11/28/2014  FINDINGS: The lungs are well-aerated. Minimal bibasilar atelectasis is noted. There is no evidence of pleural effusion or pneumothorax.  The heart is borderline enlarged. The  patient is status post median sternotomy, with evidence  of prior CABG. No acute osseous abnormalities are seen.  IMPRESSION: Minimal bibasilar atelectasis noted.  Borderline cardiomegaly.   Electronically Signed   By: Garald Balding M.D.   On: 12/26/2014 01:46   US Abdomen Limited Ruq  12/25/2014   CLINICAL DATA:  Right upper quadrant pain  EXAM: US ABDOMEN LIMITED - RIGHT UPPER QUADRANT  COMPARISON:  11/26/14  FINDINGS: Gallbladder:  Again noted tumefactive sludge within gallbladder. There is progression of gallbladder wall thickening up to 9.8 mm. Findings are suspicious for acute cholecystitis and clinical correlation is necessary. There is positive sonographic Murphy's sign.  Common bile duct:  Diameter: 5.7 mm in diameter within normal limits.  Liver:  No focal lesion identified. Within normal limits in parenchymal echogenicity.  IMPRESSION: Again noted tumefactive sludge within gallbladder. There is progression of gallbladder wall thickening up to 9.8 mm. Findings are suspicious for acute cholecystitis and clinical correlation is necessary. There is positive sonographic Murphy's sign.   Electronically Signed   By: Lahoma Crocker M.D.   On: 12/25/2014 18:28    Review of Systems  Constitutional: Positive for weight loss (17 pounds last 3 weeks). Negative for fever and chills.  HENT: Negative.   Eyes: Negative.   Respiratory: Positive for shortness of breath. Negative for wheezing.   Cardiovascular: Positive for leg swelling (on lasix).  Gastrointestinal: Positive for heartburn, nausea, abdominal pain (mostly RLQ.) and constipation. Negative for vomiting, diarrhea, blood in stool and melena.  Genitourinary: Negative.   Musculoskeletal: Positive for back pain and joint pain (Hips also).  Skin: Negative.   Neurological: Negative.   Endo/Heme/Allergies: Bruises/bleeds easily.  Psychiatric/Behavioral: Positive for depression. The patient is nervous/anxious.    Blood pressure 114/49, pulse 51,  temperature 98.4 F (36.9 C), temperature source Oral, resp. rate 18, height 5' 10.5" (1.791 m), weight 92.3 kg (203 lb 7.8 oz), SpO2 91 %. Physical Exam  Constitutional: He is oriented to person, place, and time. He appears well-developed and well-nourished. No distress.  HENT:  Head: Normocephalic.  Nose: Nose normal.  Eyes: Conjunctivae are normal. Right eye exhibits no discharge. Left eye exhibits no discharge. No scleral icterus.  Neck: Neck supple. No JVD present. No tracheal deviation present. No thyromegaly present.  Cardiovascular: Normal rate, regular rhythm, normal heart sounds and intact distal pulses.   No murmur heard. Respiratory: No respiratory distress. He has no wheezes. He has no rales. He exhibits no tenderness.  BS down in both bases, well healed sternotomy scar.  GI: Soft. Bowel sounds are normal. He exhibits no distension and no mass. There is tenderness (some but mostly RLQ). There is no rebound and no guarding.  Musculoskeletal: He exhibits no edema or tenderness.  Lymphadenopathy:    He has no cervical adenopathy.  Neurological: He is alert and oriented to person, place, and time. A cranial nerve deficit is present.  Skin: Skin is warm and dry. No rash noted. He is not diaphoretic. No erythema. No pallor.  Psychiatric: He has a normal mood and affect. His behavior is normal. Judgment and thought content normal.    Assessment/Plan: 1. Chronic/acute cholecystitis/cholelithiasis 2. ASCAD with hx of  CABG; Cardiac cath 07/2014 showing occluded SVG to distal LAD, high grade proximal 95% SVG to RCA, severe native CAD with total occlusion of the mid RCA, total occlusion of prox LAD and total occlusion of the prox LCX, patent LIMA to LAD with 50-70% ostial/proximal LIMA and underwent BMS of the SVG to RCA. BMS was done due to history GI  bleeding in the past with chronic anemia on anticoagulation therapy for afib.  3. Ischemic DCM with EF now normalized by echo 10/2014, on   Bidil 4. Chronic atrial fibrillation rate controlled on chronic Coumadin; INR 2.53 5. Stage III chronic renal disease 6.  COPD with chronic dyspnea 7.  HTN  8.Hx of hypertension  9.GERD 10.   Hyperlipidemia  Plan:  INR is to high, for surgery this AM, so we will let him eat and start some FFP recheck labs in the AM.  Dr. Rosendo Gros will see and talk to him about risk and benefits later today.  Recheck labs in AM.  We will follow with you. He is cleared from a cardiology standpoint.  I will talk with Dr. Doyle Askew about him also.  I have reordered his IV fluids also because of his renal function.    Rucker Pridgeon 12/26/2014, 11:49 AM

## 2014-12-26 NOTE — Care Management Note (Unsigned)
    Page 1 of 1   12/26/2014     6:26:02 PM CARE MANAGEMENT NOTE 12/26/2014  Patient:  LUZ, EATHERLY   Account Number:  [id number]  Date Initiated:  12/26/2014  Documentation initiated by:  Tomi Bamberger  Subjective/Objective Assessment:   dx acute cholecystitis  admit- lives with spouse., indep.     Action/Plan:   Anticipated DC Date:  12/28/2014   Anticipated DC Plan:  [place]  CM consult      Choice offered to / List presented to:             Status of service:  In process, will continue to follow Medicare Important Message given?  NA - LOS <3 / Initial given by admissions (If response is "NO", the following Medicare IM given date fields will be blank) Date Medicare IM given:   Medicare IM given by:   Date Additional Medicare IM given:   Additional Medicare IM given by:    Discharge Disposition:    Per UR Regulation:  Reviewed for med. necessity/level of care/duration of stay  If discussed at [place] of Stay Meetings, dates discussed:    Comments:  12/26/14 [place] [phone] patient lives with spouse, pta indep.  NCM will cont to follow for dc needs.

## 2014-12-26 NOTE — Consult Note (Addendum)
Admit date: 12/25/2014 Referring Physician  Dr. Doyle Askew Primary Physician  Dr. [name] Primary Cardiologist  Dr. Daneen Schick Reason for Consultation  Preop clearance for cholecystectomy  HPI: This is a 74 yo male with CAD s/p CABG with cath 07/10/2015 showing occluded SVG to distal LAD, high grade proximal 95% SVG to RCA, severe native CAD with total occlusion of the mid RCA, total occlusion of prox LAD and total occlusion of the prox LCX, patent LIMA to LAD with 50-70% ostial/proxima LIMA and underwent BMS of the SVG to RCA.  He also has a history of chronic atrial fibrilaltion (CHADS=4, HTN/CAD/age>/75) and is on chronic coumadin therapy.  He has an ischemic DCM with EF at time of cath 35% but normalized by echo 10/2014.  He has c/o abdominal pain, sharp for the past 3 weeks. Located in the RUQ/epigastric area. Pt notes nausea, Tried rolaids, and ppi but didn't help. Pt denies fever, chills, emesis, diarrhea, brbpr, black stool. Pt went [employer] 3 weeks ago, and told that he had gallbladder issues, but pain persisted and therefore presented  to [facility] and found to have acute cholecystitis.   Cardiology is now asked to consult for preoperative clearance for cholecystectomy.       PMH:   Past Medical History  Diagnosis Date  . Unspecified essential hypertension   . Anemia   . Asthma   . Hepatomegaly   . Esophageal reflux   . Other and unspecified coagulation defects   . Hiatal hernia   . Personal history of colonic polyps 05/29/2010    TUBULAR ADENOMA  . CAD (coronary artery disease)   . Hypothyroidism   . Gout   . Hyperlipidemia   . Gastric antral vascular ectasia 2013  . Atrial fibrillation   . History of blood transfusion ~ 2012    "blood count dropped; had to get 3 units"  . Arthritis     "hips; back" (07/09/2014)  . On home oxygen therapy     "2L at night" (07/09/2014)  . COPD (chronic obstructive pulmonary disease)      PSH:   Past Surgical History    Procedure Laterality Date  . Umbilical hernia repair    . Coronary angioplasty with stent placement  ~ 2008; 07/08/2014    "1 + 3"  . 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 Hernia repair    . Umbilical hernia repair  1970's  . Left and right heart catheterization with coronary/graft angiogram N/A 07/09/2014    Procedure: LEFT AND RIGHT HEART CATHETERIZATION WITH Beatrix Fetters;  Surgeon: Sinclair Grooms, MD;  Location: [facility] CATH LAB;  Service: Cardiovascular;  Laterality: N/A;  . Colonoscopy  2013    Dr. Sharlett Iles: no polyps or evidence of active bleeding  . Esophagogastroduodenoscopy  2013    Dr. 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 Givens capsule study  2013    Dr. Sharlett Iles: AVM at 5 minutes beyond first duodenal image. If persistent IDA, bleeding, recommend enteroscopy with ablation     Allergies:  Review of patient's allergies indicates no known allergies. Prior to Admit Meds:   Prescriptions prior to admission  Medication Sig Dispense Refill Last Dose  . acetaminophen (TYLENOL) 500 MG tablet Take 1,000 mg by mouth every 6 (six) hours as needed. For pain.    12/25/2014 at Unknown time  . albuterol (PROVENTIL HFA;VENTOLIN HFA) 108 (90 BASE) MCG/ACT inhaler Inhale  2 puffs into the lungs every 6 (six) hours as needed. For shortness of breath. 1 Inhaler 3 12/25/2014 at Unknown time  . aspirin EC 81 MG tablet Take 1 tablet (81 mg total) by mouth every Monday, Wednesday, and Friday.   12/25/2014 at Unknown time  . budesonide (PULMICORT) 0.25 MG/2ML nebulizer solution Take 0.25 mg by nebulization 2 (two) times daily.    12/25/2014 at Unknown time  . Cholecalciferol (VITAMIN D3) 5000 UNITS CAPS Take 5,000 Units by mouth daily.    12/25/2014 at Unknown time  . esomeprazole (NEXIUM) 40 MG capsule Take twice daily on an empty stomach 60 capsule 3 12/25/2014 at Unknown time  . ferrous sulfate 325 (65 FE) MG tablet Take 325 mg by  mouth 2 (two) times daily with a meal.    12/25/2014 at Unknown time  . furosemide (LASIX) 40 MG tablet Take 1 tablet (40 mg total) by mouth 2 (two) times daily. 60 tablet 6 12/25/2014 at Unknown time  . hydrALAZINE (APRESOLINE) 25 MG tablet Take 1 tablet (25 mg total) by mouth 3 (three) times daily. 90 tablet 6 12/25/2014 at Unknown time  . HYDROcodone-acetaminophen (NORCO/VICODIN) 5-325 MG per tablet Take 1 tablet by mouth every 6 (six) hours as needed for moderate pain. 30 tablet 0 12/25/2014 at Unknown time  . ipratropium-albuterol (DUONEB) 0.5-2.5 (3) MG/3ML SOLN Take 3 mLs by nebulization every 6 (six) hours as needed. For shortness of breath.    12/25/2014 at Unknown time  . isosorbide-hydrALAZINE (BIDIL) 20-37.5 MG per tablet Take 1 tablet by mouth 3 (three) times daily. 90 tablet 11 12/25/2014 at Unknown time  . levothyroxine (SYNTHROID, LEVOTHROID) 112 MCG tablet Take 1 tablet (112 mcg total) by mouth daily before breakfast. 30 tablet 2 12/25/2014 at Unknown time  . lovastatin (MEVACOR) 40 MG tablet Take 1 tablet (40 mg total) by mouth at bedtime. 90 tablet 2 12/24/2014 at Unknown time  . Multiple Vitamins-Minerals (MULTIVITAMINS THER. W/MINERALS) TABS Take 1 tablet by mouth daily.     12/25/2014 at Unknown time  . nitroGLYCERIN (NITROSTAT) 0.4 MG SL tablet Place 0.4 mg under the tongue every 5 (five) minutes as needed for chest pain.   unknown at unknown  . potassium chloride (K-DUR) 10 MEQ tablet Take 1 tablet (10 mEq total) by mouth daily. 30 tablet 5 12/25/2014 at Unknown time  . warfarin (COUMADIN) 5 MG tablet Take 2.5-5 mg by mouth daily. Take 5 mg Monday, Tuesday, Wednesday, Friday, and Sat. Take 2.5 mg on Thursday and Sunday   12/25/2014 at Unknown time   Fam HX:    Family History  Problem Relation Age of Onset  . Leukemia Mother   . Kidney disease Mother     kidney removed   . Colon cancer Neg Hx   . Heart attack Brother   . Hypertension      family   . Cancer Father   . Heart disease  Father   . Stomach cancer Sister   . Stomach cancer Sister   . Early death Brother    Social HX:    History   Social History  . Marital Status: Married    Spouse Name: N/A  . Number of Children: N/A  . Years of Education: N/A   Occupational History  . retired    Social History Main Topics  . Smoking status: Former Smoker -- 1.00 packs/day for 42 years    Types: Cigarettes    Quit date: 12/16/1996  . Smokeless tobacco: Never Used  .  Alcohol Use: 0.6 oz/week    1 Cans of beer per week     Comment: 07/09/2014 "averages < 1 beer/wk", 11/26/14: denies ETOH  . Drug Use: No  . Sexual Activity: No   Other Topics Concern  . Not on file   Social History Narrative     ROS:  All 11 ROS were addressed and are negative except what is stated in the HPI  Physical Exam: Blood pressure 114/49, pulse 51, temperature 98.4 F (36.9 C), temperature source Oral, resp. rate 18, height 5' 10.5" (1.791 m), weight 203 lb 7.8 oz (92.3 kg), SpO2 91 %.    General: Well developed, well nourished, in no acute distress Head: Eyes PERRLA, No xanthomas.   Normal cephalic and atramatic  Lungs:   Clear bilaterally to auscultation and percussion. Heart:   HRRR S1 S2 Pulses are 2+ & equal.            No carotid bruit. No JVD.  No abdominal bruits. No femoral bruits. Abdomen: Bowel sounds are positive, abdomen soft and non-tender without masses  Extremities:   No clubbing, cyanosis or edema.  DP +1 Neuro: Alert and oriented X 3. Psych:  Good affect, responds appropriately    Labs:   Lab Results  Component Value Date   WBC 10.3 12/26/2014   HGB 10.9* 12/26/2014   HCT 33.5* 12/26/2014   MCV 95.4 12/26/2014   PLT 331 12/26/2014    Recent Labs Lab 12/26/14 0707  NA 137  K 4.4  CL 104  CO2 24  BUN 41*  CREATININE 1.94*  CALCIUM 8.6  PROT 6.2  BILITOT 0.7  ALKPHOS 55  ALT 33  AST 25  GLUCOSE 92   No results found for: PTT Lab Results  Component Value Date   INR 2.53* 12/26/2014    INR 2.52* 12/25/2014   INR 2.5 12/20/2014   Lab Results  Component Value Date   TROPONINI 0.04* 12/25/2014     Lab Results  Component Value Date   CHOL 137 12/23/2014   CHOL 138 07/05/2014   CHOL 159 03/11/2014   Lab Results  Component Value Date   HDL 46 12/23/2014   HDL 37.60* 07/05/2014   HDL 36.80* 03/11/2014   Lab Results  Component Value Date   LDLCALC 64 12/23/2014   LDLCALC 71 07/05/2014   LDLCALC 104* 03/11/2014   Lab Results  Component Value Date   TRIG 137 12/23/2014   TRIG 147.0 07/05/2014   TRIG 92.0 03/11/2014   Lab Results  Component Value Date   CHOLHDL 3.0 12/23/2014   CHOLHDL 4 07/05/2014   CHOLHDL 4 03/11/2014   No results found for: LDLDIRECT    Radiology:  Dg Chest 2 View  12/26/2014   CLINICAL DATA:  Acute onset of nausea. Preoperative chest radiograph for gallbladder surgery. Initial encounter.  EXAM: CHEST  2 VIEW  COMPARISON:  Chest radiograph performed 11/28/2014  FINDINGS: The lungs are well-aerated. Minimal bibasilar atelectasis is noted. There is no evidence of pleural effusion or pneumothorax.  The heart is borderline enlarged. The patient is status post median sternotomy, with evidence of prior CABG. No acute osseous abnormalities are seen.  IMPRESSION: Minimal bibasilar atelectasis noted.  Borderline cardiomegaly.   Electronically Signed   By: Garald Balding M.D.   On: 12/26/2014 01:46   US Abdomen Limited Ruq  12/25/2014   CLINICAL DATA:  Right upper quadrant pain  EXAM: US ABDOMEN LIMITED - RIGHT UPPER QUADRANT  COMPARISON:  11/26/14  FINDINGS:  Gallbladder:  Again noted tumefactive sludge within gallbladder. There is progression of gallbladder wall thickening up to 9.8 mm. Findings are suspicious for acute cholecystitis and clinical correlation is necessary. There is positive sonographic Murphy's sign.  Common bile duct:  Diameter: 5.7 mm in diameter within normal limits.  Liver:  No focal lesion identified. Within normal limits in  parenchymal echogenicity.  IMPRESSION: Again noted tumefactive sludge within gallbladder. There is progression of gallbladder wall thickening up to 9.8 mm. Findings are suspicious for acute cholecystitis and clinical correlation is necessary. There is positive sonographic Murphy's sign.   Electronically Signed   By: Lahoma Crocker M.D.   On: 12/25/2014 18:28    EKG:  pending  ASSESSMENT/PLAN:  1.  Acute cholecystitis needing preoperative clearance for cholecystectomy 2.  ASCAD with remote CABG and cath 07/2014 showing occluded SVG to distal LAD, high grade proximal 95% SVG to RCA, severe native CAD with total occlusion of the mid RCA, total occlusion of prox LAD and total occlusion of the prox LCX, patent LIMA to LAD with 50-70% ostial/proximal LIMA and underwent BMS of the SVG to RCA. BMS was done due to history GI bleeding in the past with chronic anemia on anticoagulation therapy for afib. He is completely asymptomatic from a cardiac standpoint and denies any anginal CP.  He has chronic DOE which is due to his COPD and is stable.   3.  Ischemic DCM with EF now normalized by echo 10/2014.  Continue Bidil 4.  HTN 5.  GERD 6.  Hyperlipidemia 7.  Chronic atrial fibrillation rate controlled on chronic warfarin therapy for CHADS2VASC score of 4.  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  COPD with chronic dyspnea 9.  Acute on CKD stage 3  The patient has not had any anginal CP, has stable DOE from his COPD and EKG is nonischemic. He has no evidence of volume overload on exam.  Slight bump in trop at 0.04 most likely secondary to demand ischemia from his cholecystitis.   From cardiac standpoint he is stable to undergo cholecystectomy. Would continue current cardiac meds.     Sueanne Margarita, MD  12/26/2014  10:02 AM

## 2014-12-26 NOTE — Progress Notes (Signed)
Patient ID: [name] [name], male   DOB: 01-13-1941, 74 y.o.   MRN: [id number] [facility] PROGRESS NOTE  PLEZ BELTON [id number] DOB: 1941-05-22 DOA: 12/25/2014 PCP: [name], MD   Brief narrative:    Pt is 74 yo male with CAD s/p CABG, stent, P-afib on Coumadin (CHADS=2), HTN, HLD, COPD on 2 L O2 at home, hypothyroidism, presented with main concern of persistent upper quadrants abdominal pain R>L, several weeks in duration, associated with nausea and poor oral intake, pain is 7/10 in severity, worse after eating, no specific alleviating factors.   Blood work on 3/21 AST 146, ALT 164, total bili normal at 0.2, and ALP normal at 80. WBC count was 14. Colonoscopy with Dr. Sharlett Iles 11/2011 was normal. 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  - appreciate surgery team following - INR too high for surgery today, agree with surgery team to give FFP today and plan to take to OR in AM - pt cleared for surgery from IM standpoint once INR corrected - please note that pt has significant risk factors including cardiac history outlined below, CKD stage III, HTN, HLD, COPD oxygen dependent, which puts him at moderate risk of complications from surgery, pt and family made aware    Sepsis - criteria met on admission with RR 36 bpm, BP 108/42, oxygen sat 91% on 2 L [place], WBC 14 K - secondary to acute cholecystitis  - will place on empiric Zosyn for now and will monitor clinical response  - follow up on blood cultures and narrow down ABX as clinically indicated  - WBC is trending down, will repeat CBC in AM   Atrial fibrillation, chronic, CHADS2 4 - INR 2.53, will need to hold Coumadin in an anticipation of surgery tomorrow - also transfusing FFP today    Acute on chronic CKD, stage III - baseline Cr 1.4 - 1.9 - up on this admission likely secondary to the principal problem - agree with IVF and  will repeat BMP in AM - will need to hold Lasix temporarily pre op    Anemia of chronic disease, CKD, IDA - drop in Hg since admission - no signs of active bleeding - continue iron supplementation  - repeat CBC in AM   COPD - no wheezing on exam - continue BD as needed    HLD - continue statin    HTN - BP stable this AM   Coronary atherosclerosis of native coronary artery - s/p CABG and cath 07/2014    Ischemic DCM  - EF now normalized by echo 10/2014 - continue Bidil - slight bump in troponin likely from demand ischemia from cholecystitis  - weight 92.3 kg, monitor daily weights, I's and O's   Hypothyroidism  - continue synthroid   DVT prophylaxis - pt on coumadin   Code Status: Full.  Family Communication:  plan of care discussed with the patient and relative at bedside  Disposition Plan: Requires hospitalization fur ? surgery in AM  IV access:  Peripheral IV  Procedures and diagnostic studies:    CXR 12/26/2014   Minimal bibasilar atelectasis noted.  Borderline cardiomegaly.     Nm Hepato W/eject Fract  11/27/2014  The gallbladder ejection fraction was less than zero. Gallbladder uptake continued to increase even after the administration of CCK. 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     CXR 11/28/2014   Cardiac enlargement and vascular  congestion with no evidence of pneumonia.     US Abdomen Limited Ruq  12/25/2014   Again noted tumefactive sludge within gallbladder. There is progression of gallbladder wall thickening up to 9.8 mm. Findings are suspicious for acute cholecystitis and clinical correlation is necessary. There is positive sonographic Murphy's sign.   Medical Consultants:  Surgery Cardiology GI  Other Consultants:  None   IAnti-Infectives:   None  Faye Ramsay, MD  [facility] Pager [phone]  If 7PM-7AM, please contact night-coverage [email or web address] Password [email or web address] 12/26/2014, 11:09 AM   LOS: 1 day   HPI/Subjective: No events overnight.    Objective: Filed Vitals:   12/25/14 2226 12/26/14 0550 12/26/14 0614 12/26/14 0941  BP: 149/81 114/49    Pulse:      Temp: 97.9 F (36.6 C) 98.4 F (36.9 C)    TempSrc: Oral Oral    Resp: 18 18    Height:      Weight:   92.3 kg (203 lb 7.8 oz)   SpO2: 94% 98%  91%    Intake/Output Summary (Last 24 hours) at 12/26/14 1109 Last data filed at 12/26/14 0929  Gross per 24 hour  Intake 828.75 ml  Output    575 ml  Net 253.75 ml    Exam:   General:  Pt is alert, follows commands appropriately, not in acute distress  Cardiovascular: Regular rate and rhythm,  no rubs, no gallops  Respiratory: Clear to auscultation bilaterally, no wheezing, no crackles, no rhonchi  Abdomen: Soft, tender in upper abd quadrants, non distended, bowel sounds present, no guarding  Extremities: No edema, pulses DP and PT palpable bilaterally  Neuro: Grossly nonfocal  Data Reviewed: Basic Metabolic Panel:  Recent Labs Lab 12/23/14 1124 12/25/14 1502 12/26/14 0707  NA 139 138 137  K 4.1 5.0 4.4  CL 100 101 104  CO2 $Re'22 27 24  'Evd$ GLUCOSE 96 105* 92  BUN 34* 43* 41*  CREATININE 1.43* 2.19* 1.94*  CALCIUM 9.2 9.2 8.6   Liver Function Tests:  Recent Labs Lab 12/23/14 1124 12/25/14 1502 12/26/14 0707  AST 146* 26 25  ALT 164* 40 33  ALKPHOS 80 65 55  BILITOT 0.2 0.5 0.7  PROT 6.5 7.2 6.2  ALBUMIN  --  4.0 3.3*    Recent Labs Lab 12/25/14 1502  LIPASE 25   CBC:  Recent Labs Lab 12/19/14 1459 12/25/14 1502 12/26/14 0707  WBC 14.2* 11.7* 10.3  NEUTROABS  --  8.7* 6.8  HGB 10.5* 12.1* 10.9*  HCT 33.3* 37.1* 33.5*  MCV 95.1 94.2 95.4  PLT  --  401* 331   Cardiac Enzymes:  Recent Labs Lab 12/25/14 2031  TROPONINI 0.04*   Scheduled Meds: . aspirin EC  81 mg Oral Q M,W,F  . budesonide  0.25 mg Nebulization BID  . cholecalciferol  5,000 Units Oral Daily  . ferrous sulfate  325 mg Oral BID WC  . furosemide  40 mg Oral BID  . hydrALAZINE  25 mg Oral TID  .  isosorbide-hydrALAZINE  1 tablet Oral TID  . levothyroxine  100 mcg Oral QAC breakfast  . potassium chloride  10 mEq Oral Daily  . pravastatin  40 mg Oral q1800  . sodium chloride  3 mL Intravenous Q12H   Continuous Infusions:

## 2014-12-26 NOTE — Progress Notes (Signed)
ANTIBIOTIC CONSULT NOTE - INITIAL  Pharmacy Consult for Zosyn Indication: abdominal infection  No Known Allergies  Patient Measurements: Height: 5' 10.5" (179.1 cm) Weight: 203 lb 7.8 oz (92.3 kg) IBW/kg (Calculated) : 74.15  Vital Signs: Temp: 98 F (36.7 C) (03/24 1520) Temp Source: Oral (03/24 1520) BP: 140/58 mmHg (03/24 1520) Pulse Rate: 74 (03/24 1520) Intake/Output from previous day: 03/23 0701 - 03/24 0700 In: 828.8 [I.V.:828.8] Out: 450 [Urine:450] Intake/Output from this shift: Total I/O In: 277 [Blood:277] Out: 325 [Urine:325]  Labs:  Recent Labs  12/25/14 1502 12/26/14 0707  WBC 11.7* 10.3  HGB 12.1* 10.9*  PLT 401* 331  CREATININE 2.19* 1.94*   Estimated Creatinine Clearance: 37.9 mL/min (by C-G formula based on Cr of 1.94). No results for input(s): VANCOTROUGH, VANCOPEAK, VANCORANDOM, GENTTROUGH, GENTPEAK, GENTRANDOM, TOBRATROUGH, TOBRAPEAK, TOBRARND, AMIKACINPEAK, AMIKACINTROU, AMIKACIN in the last 72 hours.   Microbiology: No results found for this or any previous visit (from the past 720 hour(s)).  Medical History: Past Medical History  Diagnosis Date  . Unspecified essential hypertension   . Anemia   . Asthma   . Hepatomegaly   . Esophageal reflux   . Other and unspecified coagulation defects   . Hiatal hernia   . Personal history of colonic polyps 05/29/2010    TUBULAR ADENOMA  . CAD (coronary artery disease)   . Hypothyroidism   . Gout   . Hyperlipidemia   . Gastric antral vascular ectasia 2013  . Atrial fibrillation   . History of blood transfusion ~ 2012    "blood count dropped; had to get 3 units"  . Arthritis     "hips; back" (07/09/2014)  . On home oxygen therapy     "2L at night" (07/09/2014)  . COPD (chronic obstructive pulmonary disease)     Assessment: 74 year old male to begin Zosyn for cholecystitis with plan for OR tomorrow morning CrCl stable  Goal of Therapy:  Appropriate dosing  Plan:  Zosyn 3.375 grams iv  Q 8 hours - 4 hr infusion Pharmacy to sign off  Thank you. Anette Guarneri, PharmD [phone]  12/26/2014,5:34 PM

## 2014-12-27 LAB — PREPARE FRESH FROZEN PLASMA
Unit division: 0
Unit division: 0
Unit division: 0

## 2014-12-27 LAB — COMPREHENSIVE METABOLIC PANEL
ALBUMIN: 3.6 g/dL (ref 3.5–5.2)
ALK PHOS: 60 U/L (ref 39–117)
ALT: 28 U/L (ref 0–53)
ANION GAP: 10 (ref 5–15)
AST: 24 U/L (ref 0–37)
BUN: 30 mg/dL — ABNORMAL HIGH (ref 6–23)
CO2: 27 mmol/L (ref 19–32)
CREATININE: 1.76 mg/dL — AB (ref 0.50–1.35)
Calcium: 9 mg/dL (ref 8.4–10.5)
Chloride: 102 mmol/L (ref 96–112)
GFR calc Af Amer: 42 mL/min — ABNORMAL LOW (ref >90)
GFR calc non Af Amer: 36 mL/min — ABNORMAL LOW (ref >90)
Glucose, Bld: 92 mg/dL (ref 70–99)
Potassium: 4.7 mmol/L (ref 3.5–5.1)
SODIUM: 139 mmol/L (ref 135–145)
TOTAL PROTEIN: 6.7 g/dL (ref 6.0–8.3)
Total Bilirubin: 0.6 mg/dL (ref 0.3–1.2)

## 2014-12-27 LAB — PROTIME-INR
INR: 1.8 — ABNORMAL HIGH (ref 0.00–1.49)
INR: 1.5 — ABNORMAL HIGH (ref 0.00–1.49)
Prothrombin Time: 21 seconds — ABNORMAL HIGH (ref 11.6–15.2)
Prothrombin Time: 18.3 seconds — ABNORMAL HIGH (ref 11.6–15.2)

## 2014-12-27 LAB — CBC
HCT: 32.9 % — ABNORMAL LOW (ref 39.0–52.0)
Hemoglobin: 10.6 g/dL — ABNORMAL LOW (ref 13.0–17.0)
MCH: 30.7 pg (ref 26.0–34.0)
MCHC: 32.2 g/dL (ref 30.0–36.0)
MCV: 95.4 fL (ref 78.0–100.0)
Platelets: 315 10*3/uL (ref 150–400)
RBC: 3.45 MIL/uL — ABNORMAL LOW (ref 4.22–5.81)
RDW: 14.5 % (ref 11.5–15.5)
WBC: 9.8 10*3/uL (ref 4.0–10.5)

## 2014-12-27 LAB — LIPASE, BLOOD: LIPASE: 27 U/L (ref 11–59)

## 2014-12-27 MED ORDER — VITAMIN K1 10 MG/ML IJ SOLN
5.0000 mg | Freq: Once | INTRAVENOUS | Status: AC
  Administered 2014-12-27: 5 mg via INTRAVENOUS
  Filled 2014-12-27 (×2): qty 0.5

## 2014-12-27 MED ORDER — SODIUM CHLORIDE 0.9 % IV SOLN
INTRAVENOUS | Status: AC
Start: 2014-12-27 — End: 2014-12-27
  Administered 2014-12-27: 13:00:00 via INTRAVENOUS

## 2014-12-27 MED ORDER — IPRATROPIUM-ALBUTEROL 0.5-2.5 (3) MG/3ML IN SOLN
3.0000 mL | RESPIRATORY_TRACT | Status: DC | PRN
  Administered 2014-12-28 – 2014-12-31 (×6): 3 mL via RESPIRATORY_TRACT
  Filled 2014-12-27 (×6): qty 3

## 2014-12-27 NOTE — Progress Notes (Signed)
[facility] Progress Note     Subjective: Pt says pain in abdomen is worse.  He has pain in his upper back too.  Not hungry/thirsty.  NPO.  Ambulating some.  No oxygen in use currently.    Objective: Vital signs in last 24 hours: Temp:  [97.9 F (36.6 C)-98.9 F (37.2 C)] 98.9 F (37.2 C) (03/25 0551) Pulse Rate:  [46-90] 88 (03/25 0551) Resp:  [16-18] 18 (03/25 0551) BP: (123-156)/(37-63) 135/51 mmHg (03/25 0650) SpO2:  [91 %-97 %] 91 % (03/25 0830) Weight:  [93 kg (205 lb 0.4 oz)] 93 kg (205 lb 0.4 oz) (03/25 0551) Last BM Date: 12/23/14  Intake/Output from previous day: 03/24 0701 - 03/25 0700 In: 1945 [P.O.:440; Blood:1455; IV Piggyback:50] Out: 1325 [Urine:1325] Intake/Output this shift: Total I/O In: 0  Out: 175 [Urine:175]  PE: Gen:  Alert, NAD, pleasant Card:  RRR, no M/G/R heard Pulm:  CTA, no W/R/R Abd: Soft, distended, quite tender in the RUQ and epigastrium, +BS, no HSM   Lab Results:   Recent Labs  12/26/14 0707 12/27/14 0741  WBC 10.3 9.8  HGB 10.9* 10.6*  HCT 33.5* 32.9*  PLT 331 315   BMET  Recent Labs  12/25/14 1502 12/26/14 0707  NA 138 137  K 5.0 4.4  CL 101 104  CO2 27 24  GLUCOSE 105* 92  BUN 43* 41*  CREATININE 2.19* 1.94*  CALCIUM 9.2 8.6   PT/INR  Recent Labs  12/26/14 0707 12/26/14 1828  LABPROT 27.5* 24.5*  INR 2.53* 2.18*   CMP     Component Value Date/Time   NA 137 12/26/2014 0707   NA 139 12/23/2014 1124   K 4.4 12/26/2014 0707   CL 104 12/26/2014 0707   CO2 24 12/26/2014 0707   GLUCOSE 92 12/26/2014 0707   GLUCOSE 96 12/23/2014 1124   BUN 41* 12/26/2014 0707   BUN 34* 12/23/2014 1124   CREATININE 1.94* 12/26/2014 0707   CALCIUM 8.6 12/26/2014 0707   PROT 6.2 12/26/2014 0707   PROT 6.5 12/23/2014 1124   ALBUMIN 3.3* 12/26/2014 0707   AST 25 12/26/2014 0707   ALT 33 12/26/2014 0707   ALKPHOS 55 12/26/2014 0707   BILITOT 0.7 12/26/2014 0707   BILITOT 0.2 12/23/2014 1124   GFRNONAA 32*  12/26/2014 0707   GFRAA 37* 12/26/2014 0707   Lipase     Component Value Date/Time   LIPASE 25 12/25/2014 1502       Studies/Results: Dg Chest 2 View  12/26/2014   CLINICAL DATA:  Acute onset of nausea. Preoperative chest radiograph for gallbladder surgery. Initial encounter.  EXAM: CHEST  2 VIEW  COMPARISON:  Chest radiograph performed 11/28/2014  FINDINGS: The lungs are well-aerated. Minimal bibasilar atelectasis is noted. There is no evidence of pleural effusion or pneumothorax.  The heart is borderline enlarged. The patient is status post median sternotomy, with evidence of prior CABG. No acute osseous abnormalities are seen.  IMPRESSION: Minimal bibasilar atelectasis noted.  Borderline cardiomegaly.   Electronically Signed   By: Garald Balding M.D.   On: 12/26/2014 01:46   US Abdomen Limited Ruq  12/25/2014   CLINICAL DATA:  Right upper quadrant pain  EXAM: US ABDOMEN LIMITED - RIGHT UPPER QUADRANT  COMPARISON:  11/26/14  FINDINGS: Gallbladder:  Again noted tumefactive sludge within gallbladder. There is progression of gallbladder wall thickening up to 9.8 mm. Findings are suspicious for acute cholecystitis and clinical correlation is necessary. There is positive sonographic Murphy's sign.  Common  bile duct:  Diameter: 5.7 mm in diameter within normal limits.  Liver:  No focal lesion identified. Within normal limits in parenchymal echogenicity.  IMPRESSION: Again noted tumefactive sludge within gallbladder. There is progression of gallbladder wall thickening up to 9.8 mm. Findings are suspicious for acute cholecystitis and clinical correlation is necessary. There is positive sonographic Murphy's sign.   Electronically Signed   By: Lahoma Crocker M.D.   On: 12/25/2014 18:28    Anti-infectives: Anti-infectives    Start     Dose/Rate Route Frequency Ordered Stop   12/26/14 1800  piperacillin-tazobactam (ZOSYN) IVPB 3.375 g     3.375 g 12.5 mL/hr over 240 Minutes Intravenous Every 8 hours  12/26/14 1734         Assessment/Plan 1. Chronic/acute cholecystitis/cholelithiasis -INR is still 2.18, Cr. Improved, WBC normal, LFT's normal -Will discuss with Dr. Hulen Skains about surgery, he may be more inclined to offer perc chole tube given medical problems -NPO for now  -Ambulate and IS 2. ASCAD with hx of CABG; Cardiac cath 07/2014 showing occluded SVG to distal LAD, high grade proximal 95% SVG to RCA, severe native CAD with total occlusion of the mid RCA, total occlusion of prox LAD and total occlusion of the prox LCX, patent LIMA to LAD with 50-70% ostial/proximal LIMA and underwent BMS of the SVG to RCA. BMS was done due to history GI bleeding in the past with chronic anemia on anticoagulation therapy for afib.  3. 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 Chronic atrial fibrillation rate controlled on chronic Coumadin; INR 2.53 5. Stage III chronic renal disease 6. COPD with chronic dyspnea 7. HTN 8.Hx of hypertension 9.GERD 10. Hyperlipidemia    LOS: 2 days    Coralie Keens 12/27/2014, 9:19 AM Pager: [phone]

## 2014-12-27 NOTE — Progress Notes (Signed)
Patient ID: [name] [name], male   DOB: June 07, 1941, 74 y.o.   MRN: [id number] [facility] PROGRESS NOTE  [name] [name] QEC:[id number] DOB: 05-27-1941 DOA: 12/25/2014 PCP: Rudi Heap, MD   Brief narrative:    Pt is 74 yo male with CAD s/p CABG, stent, P-afib on Coumadin (CHADS=2), HTN, HLD, COPD on 2 L O2 at home, hypothyroidism, presented with main concern of persistent upper quadrants abdominal pain R>L, several weeks in duration, associated with nausea and poor oral intake, pain is 7/10 in severity, worse after eating, no specific alleviating factors.   Blood work on 3/21 AST 146, ALT 164, total bili normal at 0.2, and ALP normal at 80. WBC count was 14. Colonoscopy with Dr. Jarold Motto 11/2011 was normal. 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  - appreciate surgery team following - pt given FFT 3/24, INR 1.80 this AM  - please note that pt has significant risk factors including cardiac history outlined below, CKD stage III, HTN, HLD, COPD oxygen dependent, which puts him at moderate risk of complications from surgery, pt and family made aware  - will continue to follow up on surgery team recommendations    Sepsis - criteria met on admission with RR 36 bpm, BP 108/42, oxygen sat 91% on 2 L [place], WBC 14 K - secondary to acute cholecystitis  - placed on empiric Zosyn for now, continue day #2,, pt remains afebrile and WBC is now WNL  - follow up on blood cultures and narrow down ABX as clinically indicated    Atrial fibrillation, chronic, CHADS2 4 - continue to hold Coumadin for now in an anticipation of surgery    Acute on chronic CKD, stage III - baseline Cr 1.4 - 1.9 - up on this admission likely secondary to the principal problem - Cr is trending down: 1.94 --> 1.76, continue IVF for now - will need to hold Lasix temporarily pre op    Anemia of chronic disease, CKD, IDA - hg remains  stable over the past 24 hours  - no signs of active bleeding - continue iron supplementation  - repeat CBC in AM   COPD - no wheezing on exam - continue BD as needed    HLD - continue statin    HTN - BP stable this AM   Coronary atherosclerosis of native coronary artery - s/p CABG and cath 07/2014    Ischemic DCM  - EF now normalized by echo 10/2014 - continue Bidil - slight bump in troponin likely from demand ischemia from cholecystitis  - weight 92.3 kg, monitor daily weights, I's and O's   Hypothyroidism  - continue synthroid   DVT prophylaxis - pt on coumadin which is currently on hold in an anticipation of surgery, place on SCD's  Code Status: Full.  Family Communication:  plan of care discussed with the patient and relative at bedside  Disposition Plan: Requires hospitalization fur ? surgery   IV access:  Peripheral IV  Procedures and diagnostic studies:    CXR 12/26/2014   Minimal bibasilar atelectasis noted.  Borderline cardiomegaly.     Nm Hepato W/eject Fract  11/27/2014  The gallbladder ejection fraction was less than zero. Gallbladder uptake continued to increase even after the administration of CCK. 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     CXR 11/28/2014   Cardiac enlargement and vascular congestion with no evidence of pneumonia.  US Abdomen Limited Ruq  12/25/2014   Again noted tumefactive sludge within gallbladder. There is progression of gallbladder wall thickening up to 9.8 mm. Findings are suspicious for acute cholecystitis and clinical correlation is necessary. There is positive sonographic Murphy's sign.   Medical Consultants:  Surgery Cardiology GI  Other Consultants:  None   IAnti-Infectives:   None  Faye Ramsay, MD  [facility] Pager [phone]  If 7PM-7AM, please contact night-coverage [email or web address] Password [facility] 12/27/2014, 12:40 PM   LOS: 2 days   HPI/Subjective: No events overnight.   Objective: Filed Vitals:    12/27/14 0107 12/27/14 0551 12/27/14 0650 12/27/14 0830  BP: 148/42 123/37 135/51   Pulse: 46 88    Temp: 98.5 F (36.9 C) 98.9 F (37.2 C)    TempSrc: Oral Oral    Resp: 18 18    Height:      Weight:  93 kg (205 lb 0.4 oz)    SpO2:  92%  91%    Intake/Output Summary (Last 24 hours) at 12/27/14 1240 Last data filed at 12/27/14 0905  Gross per 24 hour  Intake   1945 ml  Output   1375 ml  Net    570 ml    Exam:   General:  Pt is alert, follows commands appropriately, not in acute distress  Cardiovascular: Regular rate and rhythm,  no rubs, no gallops  Respiratory: Clear to auscultation bilaterally, no wheezing, diminished breath sounds at bases   Abdomen: Soft, tender in upper abd quadrants, non distended, bowel sounds present, no guarding  Extremities: No edema, pulses DP and PT palpable bilaterally  Neuro: Grossly nonfocal  Data Reviewed: Basic Metabolic Panel:  Recent Labs Lab 12/23/14 1124 12/25/14 1502 12/26/14 0707 12/27/14 0741  NA 139 138 137 139  K 4.1 5.0 4.4 4.7  CL 100 101 104 102  CO2 $Re'22 27 24 27  'qIC$ GLUCOSE 96 105* 92 92  BUN 34* 43* 41* 30*  CREATININE 1.43* 2.19* 1.94* 1.76*  CALCIUM 9.2 9.2 8.6 9.0   Liver Function Tests:  Recent Labs Lab 12/23/14 1124 12/25/14 1502 12/26/14 0707 12/27/14 0741  AST 146* $Remove'26 25 24  'xPLyhFn$ ALT 164* 40 33 28  ALKPHOS 80 65 55 60  BILITOT 0.2 0.5 0.7 0.6  PROT 6.5 7.2 6.2 6.7  ALBUMIN  --  4.0 3.3* 3.6    Recent Labs Lab 12/25/14 1502 12/27/14 0741  LIPASE 25 27   CBC:  Recent Labs Lab 12/25/14 1502 12/26/14 0707 12/27/14 0741  WBC 11.7* 10.3 9.8  NEUTROABS 8.7* 6.8  --   HGB 12.1* 10.9* 10.6*  HCT 37.1* 33.5* 32.9*  MCV 94.2 95.4 95.4  PLT 401* 331 315   Cardiac Enzymes:  Recent Labs Lab 12/25/14 2031  TROPONINI 0.04*   Scheduled Meds: . aspirin EC  81 mg Oral Q M,W,F  . budesonide  0.25 mg Nebulization BID  . cholecalciferol  5,000 Units Oral Daily  . ferrous sulfate  325 mg Oral  BID WC  . hydrALAZINE  25 mg Oral TID  . isosorbide-hydrALAZINE  1 tablet Oral TID  . levothyroxine  100 mcg Oral QAC breakfast  . piperacillin-tazobactam (ZOSYN)  IV  3.375 g Intravenous Q8H  . potassium chloride  10 mEq Oral Daily  . pravastatin  40 mg Oral q1800  . sodium chloride  3 mL Intravenous Q12H   Continuous Infusions:

## 2014-12-28 ENCOUNTER — Inpatient Hospital Stay (HOSPITAL_COMMUNITY): Payer: Medicare Other

## 2014-12-28 DIAGNOSIS — I10 Essential (primary) hypertension: Secondary | ICD-10-CM

## 2014-12-28 DIAGNOSIS — I2510 Atherosclerotic heart disease of native coronary artery without angina pectoris: Secondary | ICD-10-CM

## 2014-12-28 LAB — COMPREHENSIVE METABOLIC PANEL
ALBUMIN: 3.4 g/dL — AB (ref 3.5–5.2)
ALK PHOS: 60 U/L (ref 39–117)
ALT: 28 U/L (ref 0–53)
AST: 25 U/L (ref 0–37)
Anion gap: 5 (ref 5–15)
BUN: 24 mg/dL — ABNORMAL HIGH (ref 6–23)
CALCIUM: 8.8 mg/dL (ref 8.4–10.5)
CO2: 35 mmol/L — AB (ref 19–32)
CREATININE: 1.82 mg/dL — AB (ref 0.50–1.35)
Chloride: 99 mmol/L (ref 96–112)
GFR, EST AFRICAN AMERICAN: 40 mL/min — AB (ref >90)
GFR, EST NON AFRICAN AMERICAN: 35 mL/min — AB (ref >90)
GLUCOSE: 92 mg/dL (ref 70–99)
POTASSIUM: 4.2 mmol/L (ref 3.5–5.1)
SODIUM: 139 mmol/L (ref 135–145)
TOTAL PROTEIN: 6.6 g/dL (ref 6.0–8.3)
Total Bilirubin: 0.8 mg/dL (ref 0.3–1.2)

## 2014-12-28 LAB — CBC
HEMATOCRIT: 33.5 % — AB (ref 39.0–52.0)
HEMOGLOBIN: 10.7 g/dL — AB (ref 13.0–17.0)
MCH: 30.6 pg (ref 26.0–34.0)
MCHC: 31.9 g/dL (ref 30.0–36.0)
MCV: 95.7 fL (ref 78.0–100.0)
PLATELETS: 319 10*3/uL (ref 150–400)
RBC: 3.5 MIL/uL — AB (ref 4.22–5.81)
RDW: 14.6 % (ref 11.5–15.5)
WBC: 10.3 10*3/uL (ref 4.0–10.5)

## 2014-12-28 LAB — PROTIME-INR
INR: 1.45 (ref 0.00–1.49)
Prothrombin Time: 17.8 seconds — ABNORMAL HIGH (ref 11.6–15.2)

## 2014-12-28 MED ORDER — FENTANYL CITRATE 0.05 MG/ML IJ SOLN
INTRAMUSCULAR | Status: AC | PRN
  Administered 2014-12-28 (×3): 50 ug via INTRAVENOUS

## 2014-12-28 MED ORDER — SODIUM CHLORIDE 0.9 % IV SOLN
INTRAVENOUS | Status: AC
  Administered 2014-12-28: 17:00:00 via INTRAVENOUS

## 2014-12-28 MED ORDER — MEPERIDINE HCL 25 MG/ML IJ SOLN
INTRAMUSCULAR | Status: AC | PRN
  Administered 2014-12-28: 25 mg via INTRAVENOUS

## 2014-12-28 MED ORDER — FENTANYL CITRATE 0.05 MG/ML IJ SOLN
INTRAMUSCULAR | Status: AC
  Filled 2014-12-28: qty 2

## 2014-12-28 MED ORDER — LIDOCAINE HCL 1 % IJ SOLN
INTRAMUSCULAR | Status: AC
  Filled 2014-12-28: qty 20

## 2014-12-28 MED ORDER — MIDAZOLAM HCL 2 MG/2ML IJ SOLN
INTRAMUSCULAR | Status: AC
  Filled 2014-12-28: qty 2

## 2014-12-28 MED ORDER — LEVOFLOXACIN IN D5W 500 MG/100ML IV SOLN
500.0000 mg | Freq: Once | INTRAVENOUS | Status: DC
  Filled 2014-12-28: qty 100

## 2014-12-28 MED ORDER — MEPERIDINE HCL 50 MG/ML IJ SOLN
INTRAMUSCULAR | Status: AC
  Filled 2014-12-28: qty 1

## 2014-12-28 MED ORDER — KETOROLAC TROMETHAMINE 30 MG/ML IJ SOLN: 30.0000 mg | Freq: Four times a day (QID) | INTRAMUSCULAR | Status: DC

## 2014-12-28 MED ORDER — IOHEXOL 300 MG/ML  SOLN
50.0000 mL | Freq: Once | INTRAMUSCULAR | Status: AC | PRN
  Administered 2014-12-28: 20 mL

## 2014-12-28 MED ORDER — SODIUM CHLORIDE 0.9 % IV SOLN
INTRAVENOUS | Status: AC | PRN
  Administered 2014-12-28: 10 mL/h via INTRAVENOUS

## 2014-12-28 MED ORDER — MIDAZOLAM HCL 2 MG/2ML IJ SOLN
INTRAMUSCULAR | Status: AC | PRN
  Administered 2014-12-28 (×3): 1 mg via INTRAVENOUS

## 2014-12-28 MED ORDER — ONDANSETRON HCL 4 MG/2ML IJ SOLN
INTRAMUSCULAR | Status: AC
  Administered 2014-12-28: 8 mg
  Filled 2014-12-28: qty 4

## 2014-12-28 MED ORDER — SODIUM CHLORIDE 0.9 % IV BOLUS (SEPSIS)
500.0000 mL | Freq: Once | INTRAVENOUS | Status: AC
Start: 2014-12-28 — End: 2014-12-28
  Administered 2014-12-28: 500 mL via INTRAVENOUS

## 2014-12-28 MED ORDER — ONDANSETRON 8 MG/NS 50 ML IVPB: 8.0000 mg | Freq: Once | INTRAVENOUS | Status: DC

## 2014-12-28 NOTE — Progress Notes (Signed)
Pt arrived from 5W. Wife with him at bedside. VSS. Bilary drain intact and clean. CMT and elink notified. Will continue to monitor.

## 2014-12-28 NOTE — Consult Note (Signed)
Chief Complaint: Chief Complaint  Patient presents with  . Abdominal Pain    Referring Physician(s): Dr. Zella Richer of General Surgery.  History of Present Illness: Sylvain R Weddell is a 74 y.o. male admitted 12/25/2014 for acute cholecystitis, diagnosed with [place] and clinical presentation.   The patient has been treated medically over the past 3 days, as his INR was elevated upon admission, which increased his surgical risk.  There was Cardiology clearance obtained for a pending surgery, now with concern for prolonged hospitalization after a surgical cholecystectomy.  Request this morning for a percutaneous cholecystostomy.    The patient denies any recent fevers, rigors, or chills.  His total bilirubin is within normal limits, and now with his INR below 1.5.    US demonstrates changes compatible with acute cholecystitis.    Past Medical History  Diagnosis Date  . Unspecified essential hypertension   . Anemia   . Asthma   . Hepatomegaly   . Esophageal reflux   . Other and unspecified coagulation defects   . Hiatal hernia   . Personal history of colonic polyps 05/29/2010    TUBULAR ADENOMA  . CAD (coronary artery disease)   . Hypothyroidism   . Gout   . Hyperlipidemia   . Gastric antral vascular ectasia 2013  . Atrial fibrillation   . History of blood transfusion ~ 2012    "blood count dropped; had to get 3 units"  . Arthritis     "hips; back" (07/09/2014)  . On home oxygen therapy     "2L at night" (07/09/2014)  . COPD (chronic obstructive pulmonary disease)     Past Surgical History  Procedure Laterality Date  . Umbilical hernia repair    . Coronary angioplasty with stent placement  ~ 2008; 07/08/2014    "1 + 3"  . 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 Hernia repair    . Umbilical hernia repair  1970's  . Left and right heart catheterization with coronary/graft angiogram N/A 07/09/2014    Procedure: LEFT AND RIGHT HEART CATHETERIZATION WITH  Beatrix Fetters;  Surgeon: Sinclair Grooms, MD;  Location: [facility] CATH LAB;  Service: Cardiovascular;  Laterality: N/A;  . Colonoscopy  2013    Dr. Sharlett Iles: no polyps or evidence of active bleeding  . Esophagogastroduodenoscopy  2013    Dr. 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 Givens capsule study  2013    Dr. Sharlett Iles: AVM at 5 minutes beyond first duodenal image. If persistent IDA, bleeding, recommend enteroscopy with ablation     Allergies: Review of patient's allergies indicates no known allergies.  Medications: Prior to Admission medications   Medication Sig Start Date End Date Taking? Authorizing Provider  acetaminophen (TYLENOL) 500 MG tablet Take 1,000 mg by mouth every 6 (six) hours as needed. For pain.    Yes Historical Provider, MD  albuterol (PROVENTIL HFA;VENTOLIN HFA) 108 (90 BASE) MCG/ACT inhaler Inhale 2 puffs into the lungs every 6 (six) hours as needed. For shortness of breath. 11/22/14  Yes Chipper Herb, MD  aspirin EC 81 MG tablet Take 1 tablet (81 mg total) by mouth every Monday, Wednesday, and Friday. 10/08/14  Yes Belva Crome, MD  budesonide (PULMICORT) 0.25 MG/2ML nebulizer solution Take 0.25 mg by nebulization 2 (two) times daily.    Yes Historical Provider, MD  Cholecalciferol (VITAMIN D3) 5000 UNITS CAPS Take 5,000 Units by mouth daily.    Yes  Historical Provider, MD  esomeprazole (NEXIUM) 40 MG capsule Take twice daily on an empty stomach 12/20/14  Yes Claretta Fraise, MD  ferrous sulfate 325 (65 FE) MG tablet Take 325 mg by mouth 2 (two) times daily with a meal.    Yes Historical Provider, MD  furosemide (LASIX) 40 MG tablet Take 1 tablet (40 mg total) by mouth 2 (two) times daily. 10/17/14  Yes Belva Crome, MD  hydrALAZINE (APRESOLINE) 25 MG tablet Take 1 tablet (25 mg total) by mouth 3 (three) times daily. 10/30/14  Yes Belva Crome, MD  HYDROcodone-acetaminophen (NORCO/VICODIN) 5-325 MG per tablet Take  1 tablet by mouth every 6 (six) hours as needed for moderate pain. 11/28/14  Yes Lezlie Octave Black, NP  ipratropium-albuterol (DUONEB) 0.5-2.5 (3) MG/3ML SOLN Take 3 mLs by nebulization every 6 (six) hours as needed. For shortness of breath.    Yes Historical Provider, MD  isosorbide-hydrALAZINE (BIDIL) 20-37.5 MG per tablet Take 1 tablet by mouth 3 (three) times daily. 06/25/14  Yes Liliane Shi, PA-C  levothyroxine (SYNTHROID, LEVOTHROID) 112 MCG tablet Take 1 tablet (112 mcg total) by mouth daily before breakfast. 12/12/14  Yes Claretta Fraise, MD  lovastatin (MEVACOR) 40 MG tablet Take 1 tablet (40 mg total) by mouth at bedtime. 12/07/13  Yes Belva Crome, MD  Multiple Vitamins-Minerals (MULTIVITAMINS THER. W/MINERALS) TABS Take 1 tablet by mouth daily.     Yes Historical Provider, MD  nitroGLYCERIN (NITROSTAT) 0.4 MG SL tablet Place 0.4 mg under the tongue every 5 (five) minutes as needed for chest pain.   Yes Historical Provider, MD  potassium chloride (K-DUR) 10 MEQ tablet Take 1 tablet (10 mEq total) by mouth daily. 10/09/14  Yes Belva Crome, MD  warfarin (COUMADIN) 5 MG tablet Take 2.5-5 mg by mouth daily. Take 5 mg Monday, Tuesday, Wednesday, Friday, and Sat. Take 2.5 mg on Thursday and Sunday   Yes Historical Provider, MD     Family History  Problem Relation Age of Onset  . Leukemia Mother   . Kidney disease Mother     kidney removed   . Colon cancer Neg Hx   . Heart attack Brother   . Hypertension      family   . Cancer Father   . Heart disease Father   . Stomach cancer Sister   . Stomach cancer Sister   . Early death Brother     History   Social History  . Marital Status: Married    Spouse Name: N/A  . Number of Children: N/A  . Years of Education: N/A   Occupational History  . retired    Social History Main Topics  . Smoking status: Former Smoker -- 1.00 packs/day for 42 years    Types: Cigarettes    Quit date: 12/16/1996  . Smokeless tobacco: Never Used  . Alcohol  Use: 0.6 oz/week    1 Cans of beer per week     Comment: 07/09/2014 "averages < 1 beer/wk", 11/26/14: denies ETOH  . Drug Use: No  . Sexual Activity: No   Other Topics Concern  . None   Social History Narrative     Review of Systems: A 12 point ROS discussed and pertinent positives are indicated in the HPI above.  All other systems are negative.  Review of Systems  Vital Signs: BP 168/67 mmHg  Pulse 77  Temp(Src) 98.1 F (36.7 C) (Oral)  Resp 18  Ht 5' 10.5" (1.791 m)  Wt 202 lb  13.2 oz (92 kg)  BMI 28.68 kg/m2  SpO2 94%  Physical Exam  Mallampati Score:  2  Imaging: Dg Chest 2 View  12/26/2014   CLINICAL DATA:  Acute onset of nausea. Preoperative chest radiograph for gallbladder surgery. Initial encounter.  EXAM: CHEST  2 VIEW  COMPARISON:  Chest radiograph performed 11/28/2014  FINDINGS: The lungs are well-aerated. Minimal bibasilar atelectasis is noted. There is no evidence of pleural effusion or pneumothorax.  The heart is borderline enlarged. The patient is status post median sternotomy, with evidence of prior CABG. No acute osseous abnormalities are seen.  IMPRESSION: Minimal bibasilar atelectasis noted.  Borderline cardiomegaly.   Electronically Signed   By: Garald Balding M.D.   On: 12/26/2014 01:46   Dg Chest Port 1 View  11/28/2014   CLINICAL DATA:  Initial evaluation of fever  EXAM: PORTABLE CHEST - 1 VIEW  COMPARISON:  09/15/2011  FINDINGS: Mild to moderate cardiac enlargement status post CABG. Mild vascular congestion similar to prior study. No evidence of pulmonary edema or infiltrate.  IMPRESSION: Cardiac enlargement and vascular congestion with no evidence of pneumonia.   Electronically Signed   By: Skipper Cliche M.D.   On: 11/28/2014 14:24   US Abdomen Limited Ruq  12/25/2014   CLINICAL DATA:  Right upper quadrant pain  EXAM: US ABDOMEN LIMITED - RIGHT UPPER QUADRANT  COMPARISON:  11/26/14  FINDINGS: Gallbladder:  Again noted tumefactive sludge within  gallbladder. There is progression of gallbladder wall thickening up to 9.8 mm. Findings are suspicious for acute cholecystitis and clinical correlation is necessary. There is positive sonographic Murphy's sign.  Common bile duct:  Diameter: 5.7 mm in diameter within normal limits.  Liver:  No focal lesion identified. Within normal limits in parenchymal echogenicity.  IMPRESSION: Again noted tumefactive sludge within gallbladder. There is progression of gallbladder wall thickening up to 9.8 mm. Findings are suspicious for acute cholecystitis and clinical correlation is necessary. There is positive sonographic Murphy's sign.   Electronically Signed   By: Lahoma Crocker M.D.   On: 12/25/2014 18:28    Labs:  CBC:  Recent Labs  12/25/14 1502 12/26/14 0707 12/27/14 0741 12/28/14 0508  WBC 11.7* 10.3 9.8 10.3  HGB 12.1* 10.9* 10.6* 10.7*  HCT 37.1* 33.5* 32.9* 33.5*  PLT 401* 331 315 319    COAGS:  Recent Labs  12/26/14 1828 12/27/14 0741 12/27/14 1958 12/28/14 0508  INR 2.18* 1.80* 1.50* 1.45    BMP:  Recent Labs  12/25/14 1502 12/26/14 0707 12/27/14 0741 12/28/14 0508  NA 138 137 139 139  K 5.0 4.4 4.7 4.2  CL 101 104 102 99  CO2 27 24 27  35*  GLUCOSE 105* 92 92 92  BUN 43* 41* 30* 24*  CALCIUM 9.2 8.6 9.0 8.8  CREATININE 2.19* 1.94* 1.76* 1.82*  GFRNONAA 28* 32* 36* 35*  GFRAA 32* 37* 42* 40*    LIVER FUNCTION TESTS:  Recent Labs  12/25/14 1502 12/26/14 0707 12/27/14 0741 12/28/14 0508  BILITOT 0.5 0.7 0.6 0.8  AST 26 25 24 25   ALT 40 33 28 28  ALKPHOS 65 55 60 60  PROT 7.2 6.2 6.7 6.6  ALBUMIN 4.0 3.3* 3.6 3.4*    TUMOR MARKERS: No results for input(s): AFPTM, CEA, CA199, CHROMGRNA in the last 8760 hours.  Assessment and Plan:  74 year old male with acute cholecystitis, now with concern for increased peri-operative risk for cholecystectomy.    Plan is for percutaneous cholecystostomy, with probable interval cholecystectomy.  Thank you for this  interesting consult.  I greatly enjoyed meeting Zakiah R [place]qu[place] and look forward to participating in their care.  SignedCorrie Mckusick 12/28/2014, 12:47 PM    I spent a total of 40 Minutes    in face to face in clinical consultation, greater than 50% of which was counseling/coordinating care for acute cholecystitis.  Risks and Benefits discussed with the patient including bleeding, infection, damage to adjacent structures, bowel perforation/fistula connection, and sepsis. All of the patient's questions were answered, patient is agreeable to proceed. Consent signed and in chart. Signed,  Dulcy Fanny. Earleen Newport, DO

## 2014-12-28 NOTE — Progress Notes (Signed)
Gave report to receiving nurse. Patient being transferred to [place]. Family made aware of transfer plans. Will transfer shortly.

## 2014-12-28 NOTE — Procedures (Signed)
Interventional Radiology Procedure Note  Procedure: US guided percutaneous cholecystostomy.   Findings:  Microlithiasis.  Overlying colon.  10.2 F pigtail into the gallbladder Complications: No immediate Recommendations:  - Routine care - follow drain output. - dressing changes   Signed,  Dulcy Fanny. Earleen Newport, DO

## 2014-12-28 NOTE — Progress Notes (Signed)
PATIENT DETAILS Name: [name] [name] Age: 74 y.o. Sex: male Date of Birth: 01-21-1941 Admit Date: 12/25/2014 Admitting Physician Jani Gravel, MD [id number], Elenore Rota, MD  Subjective: No major complaints.  Assessment/Plan: Active Problems:   Acute Cholecystitis:Admitted , kept NPO, started on empiric Zosyn and other supportive measures.Seen by cards, pre-operatively and was cleared to proceed with surgery. Patient was on coumadin and had to reverse it with Vitamin K in order to proceed with surgery. Seen by Gen Surgery today, recommendations are to place a perc drain rather than cholecystectomy.     Sepsis:secondary to above. Sepsis pathophysiology has resolved.    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 Continue ASA and statin    Atrial Fibrillation:coumadin reversed in anticipation of cholecystectomy/drain placement. INR at 1.4. Will resume coumadin once perc drain has been placed-suspect from tomorrow if no adverse effects from perc drain.    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 Monitor lytes periodically.    Hx of Chronic Systolic CHF:EF has now normalized-last EF per echo on 10/14/14 was 55%. Remains clinically compensated. Continue Bidil, lasix remains on hold    Hypothyroidism:continue Levothyroxine    [id number] Bidil and hydralazine    COPD:Stable, lungs clear. Continue nebs.   Disposition: Remain inpatient  Antibiotics:  See below   Anti-infectives    Start     Dose/Rate Route Frequency Ordered Stop   12/26/14 1800  piperacillin-tazobactam (ZOSYN) IVPB 3.375 g     3.375 g 12.5 mL/hr over 240 Minutes Intravenous Every 8 hours 12/26/14 1734        DVT Prophylaxis:  SCD's  Code Status: Full code   Family Communication Spouse at bedsde  Procedures:  None  CONSULTS:  general surgery and IR  Time spent 40 minutes-which includes 50% of the time with  face-to-face with patient/ family and coordinating care related to the above assessment and plan.  MEDICATIONS: Scheduled Meds: . aspirin EC  81 mg Oral Q M,W,F  . budesonide  0.25 mg Nebulization BID  . cholecalciferol  5,000 Units Oral Daily  . ferrous sulfate  325 mg Oral BID WC  . hydrALAZINE  25 mg Oral TID  . isosorbide-hydrALAZINE  1 tablet Oral TID  . levothyroxine  100 mcg Oral QAC breakfast  . piperacillin-tazobactam (ZOSYN)  IV  3.375 g Intravenous Q8H  . potassium chloride  10 mEq Oral Daily  . pravastatin  40 mg Oral q1800  . sodium chloride  3 mL Intravenous Q12H   Continuous Infusions:  PRN Meds:.acetaminophen, HYDROmorphone (DILAUDID) injection, ipratropium-albuterol, nitroGLYCERIN, ondansetron (ZOFRAN) IV    PHYSICAL EXAM: Vital signs in last 24 hours: Filed Vitals:   12/27/14 2301 12/28/14 0537 12/28/14 0741 12/28/14 1013  BP: 158/56 140/40  168/67  Pulse:  69  77  Temp: 99.2 F (37.3 C) 98.1 F (36.7 C)    TempSrc: Oral Oral    Resp: 18 18    Height:      Weight:  92 kg (202 lb 13.2 oz)    SpO2: 93% 95% 95% 94%    Weight change: -1 kg (-2 lb 3.3 oz) Filed Weights   12/26/14 0614 12/27/14 0551 12/28/14 0537  Weight: 92.3 kg (203 lb 7.8 oz) 93 kg (205 lb 0.4 oz) 92 kg (202 lb 13.2 oz)   Body mass index is 28.68 kg/(m^2).   Gen Exam: Awake and alert with clear speech.  Neck: Supple, No JVD.   Chest: B/L Clear.   CVS: S1 S2 Regular, no murmurs.  Abdomen: soft, BS +, non tender, non distended.  Extremities: no edema, lower extremities warm to touch. Neurologic: Non Focal.   Skin: No Rash.   Wounds: N/A.   Intake/Output from previous day:  Intake/Output Summary (Last 24 hours) at 12/28/14 1234 Last data filed at 12/28/14 0920  Gross per 24 hour  Intake    922 ml  Output   1000 ml  Net    -78 ml     LAB RESULTS: CBC  Recent Labs Lab 12/25/14 1502 12/26/14 0707 12/27/14 0741 12/28/14 0508  WBC 11.7* 10.3 9.8 10.3  HGB 12.1* 10.9*  10.6* 10.7*  HCT 37.1* 33.5* 32.9* 33.5*  PLT 401* 331 315 319  MCV 94.2 95.4 95.4 95.7  MCH 30.7 31.1 30.7 30.6  MCHC 32.6 32.5 32.2 31.9  RDW 14.7 14.8 14.5 14.6  LYMPHSABS 0.9 1.0  --   --   MONOABS 1.5* 1.6*  --   --   EOSABS 0.5 0.8*  --   --   BASOSABS 0.1 0.1  --   --     Chemistries   Recent Labs Lab 12/23/14 1124 12/25/14 1502 12/26/14 0707 12/27/14 0741 12/28/14 0508  NA 139 138 137 139 139  K 4.1 5.0 4.4 4.7 4.2  CL 100 101 104 102 99  CO2 22 27 24 27  35*  GLUCOSE 96 105* 92 92 92  BUN 34* 43* 41* 30* 24*  CREATININE 1.43* 2.19* 1.94* 1.76* 1.82*  CALCIUM 9.2 9.2 8.6 9.0 8.8    CBG: No results for input(s): GLUCAP in the last 168 hours.  GFR Estimated Creatinine Clearance: 40.3 mL/min (by C-G formula based on Cr of 1.82).  Coagulation profile  Recent Labs Lab 12/26/14 0707 12/26/14 1828 12/27/14 0741 12/27/14 1958 12/28/14 0508  INR 2.53* 2.18* 1.80* 1.50* 1.45    Cardiac Enzymes  Recent Labs Lab 12/25/14 2031  TROPONINI 0.04*    Invalid input(s): POCBNP No results for input(s): DDIMER in the last 72 hours. No results for input(s): HGBA1C in the last 72 hours. No results for input(s): CHOL, HDL, LDLCALC, TRIG, CHOLHDL, LDLDIRECT in the last 72 hours. No results for input(s): TSH, T4TOTAL, T3FREE, THYROIDAB in the last 72 hours.  Invalid input(s): FREET3 No results for input(s): VITAMINB12, FOLATE, FERRITIN, TIBC, IRON, RETICCTPCT in the last 72 hours.  Recent Labs  12/25/14 1502 12/27/14 0741  LIPASE 25 27    Urine Studies No results for input(s): UHGB, CRYS in the last 72 hours.  Invalid input(s): UACOL, UAPR, USPG, UPH, UTP, UGL, UKET, UBIL, UNIT, UROB, ULEU, UEPI, UWBC, URBC, UBAC, CAST, UCOM, BILUA  MICROBIOLOGY: Recent Results (from the past 240 hour(s))  Culture, blood (routine x 2)     Status: None (Preliminary result)   Collection Time: 12/26/14  6:30 PM  Result Value Ref Range Status   Specimen Description BLOOD  LEFT ARM  Final   Special Requests BOTTLES DRAWN AEROBIC AND ANAEROBIC 10CC  Final   Culture   Final           BLOOD CULTURE RECEIVED NO GROWTH TO DATE CULTURE WILL BE HELD FOR 5 DAYS BEFORE ISSUING A FINAL NEGATIVE REPORT Note: Culture results may be compromised due to an excessive volume of blood received in culture bottles. Performed at [facility]    Report Status PENDING  Incomplete  Culture, blood (routine x 2)  Status: None (Preliminary result)   Collection Time: 12/26/14  6:40 PM  Result Value Ref Range Status   Specimen Description BLOOD LEFT HAND  Final   Special Requests BOTTLES DRAWN AEROBIC ONLY [place]  Final   Culture   Final           BLOOD CULTURE RECEIVED NO GROWTH TO DATE CULTURE WILL BE HELD FOR 5 DAYS BEFORE ISSUING A FINAL NEGATIVE REPORT Performed at [facility]    Report Status PENDING  Incomplete    RADIOLOGY STUDIES/RESULTS: Dg Chest 2 View  12/26/2014   CLINICAL DATA:  Acute onset of nausea. Preoperative chest radiograph for gallbladder surgery. Initial encounter.  EXAM: CHEST  2 VIEW  COMPARISON:  Chest radiograph performed 11/28/2014  FINDINGS: The lungs are well-aerated. Minimal bibasilar atelectasis is noted. There is no evidence of pleural effusion or pneumothorax.  The heart is borderline enlarged. The patient is status post median sternotomy, with evidence of prior CABG. No acute osseous abnormalities are seen.  IMPRESSION: Minimal bibasilar atelectasis noted.  Borderline cardiomegaly.   Electronically Signed   By: Garald Balding M.D.   On: 12/26/2014 01:46   Dg Chest Port 1 View  11/28/2014   CLINICAL DATA:  Initial evaluation of fever  EXAM: PORTABLE CHEST - 1 VIEW  COMPARISON:  09/15/2011  FINDINGS: Mild to moderate cardiac enlargement status post CABG. Mild vascular congestion similar to prior study. No evidence of pulmonary edema or infiltrate.  IMPRESSION: Cardiac enlargement and vascular congestion with no evidence of pneumonia.    Electronically Signed   By: Skipper Cliche M.D.   On: 11/28/2014 14:24   US Abdomen Limited Ruq  12/25/2014   CLINICAL DATA:  Right upper quadrant pain  EXAM: US ABDOMEN LIMITED - RIGHT UPPER QUADRANT  COMPARISON:  11/26/14  FINDINGS: Gallbladder:  Again noted tumefactive sludge within gallbladder. There is progression of gallbladder wall thickening up to 9.8 mm. Findings are suspicious for acute cholecystitis and clinical correlation is necessary. There is positive sonographic Murphy's sign.  Common bile duct:  Diameter: 5.7 mm in diameter within normal limits.  Liver:  No focal lesion identified. Within normal limits in parenchymal echogenicity.  IMPRESSION: Again noted tumefactive sludge within gallbladder. There is progression of gallbladder wall thickening up to 9.8 mm. Findings are suspicious for acute cholecystitis and clinical correlation is necessary. There is positive sonographic Murphy's sign.   Electronically Signed   By: Lahoma Crocker M.D.   On: 12/25/2014 18:28    Oren Binet, MD  [facility] Pager:[phone]  If 7PM-7AM, please contact night-coverage [email or web address] Password [facility] 12/28/2014, 12:34 PM   LOS: 3 days

## 2014-12-28 NOTE — Progress Notes (Signed)
[facility] Progress Note     Subjective: Pt does feel some better, but still quite tender in the RUQ and epigastrium.  Patient and wife at bedside are agreeable to perc chole drain.  No nausea/vomiting.  Pain well controlled currently.  Ambulating OOB.  Objective: Vital signs in last 24 hours: Temp:  [97.7 F (36.5 C)-99.2 F (37.3 C)] 98.1 F (36.7 C) (03/26 0537) Pulse Rate:  [69-82] 69 (03/26 0537) Resp:  [16-18] 18 (03/26 0537) BP: (131-158)/(40-56) 140/40 mmHg (03/26 0537) SpO2:  [91 %-95 %] 95 % (03/26 0741) Weight:  [92 kg (202 lb 13.2 oz)] 92 kg (202 lb 13.2 oz) (03/26 0537) Last BM Date: 12/23/14  Intake/Output from previous day: 03/25 0701 - 03/26 0700 In: 922 [P.O.:922] Out: 975 [Urine:975] Intake/Output this shift: Total I/O In: -  Out: 200 [Urine:200]  PE: Gen:  Alert, NAD, pleasant Abd: Soft, quite tender in epigastrium/RUQ, mild distension, +BS, no HSM   Lab Results:   Recent Labs  12/27/14 0741 12/28/14 0508  WBC 9.8 10.3  HGB 10.6* 10.7*  HCT 32.9* 33.5*  PLT 315 319   BMET  Recent Labs  12/27/14 0741 12/28/14 0508  NA 139 139  K 4.7 4.2  CL 102 99  CO2 27 35*  GLUCOSE 92 92  BUN 30* 24*  CREATININE 1.76* 1.82*  CALCIUM 9.0 8.8   PT/INR  Recent Labs  12/27/14 1958 12/28/14 0508  LABPROT 18.3* 17.8*  INR 1.50* 1.45   CMP     Component Value Date/Time   NA 139 12/28/2014 0508   NA 139 12/23/2014 1124   K 4.2 12/28/2014 0508   CL 99 12/28/2014 0508   CO2 35* 12/28/2014 0508   GLUCOSE 92 12/28/2014 0508   GLUCOSE 96 12/23/2014 1124   BUN 24* 12/28/2014 0508   BUN 34* 12/23/2014 1124   CREATININE 1.82* 12/28/2014 0508   CALCIUM 8.8 12/28/2014 0508   PROT 6.6 12/28/2014 0508   PROT 6.5 12/23/2014 1124   ALBUMIN 3.4* 12/28/2014 0508   AST 25 12/28/2014 0508   ALT 28 12/28/2014 0508   ALKPHOS 60 12/28/2014 0508   BILITOT 0.8 12/28/2014 0508   BILITOT 0.2 12/23/2014 1124   GFRNONAA 35* 12/28/2014 0508   GFRAA 40* 12/28/2014 0508   Lipase     Component Value Date/Time   LIPASE 27 12/27/2014 0741       Studies/Results: No results found.  Anti-infectives: Anti-infectives    Start     Dose/Rate Route Frequency Ordered Stop   12/26/14 1800  piperacillin-tazobactam (ZOSYN) IVPB 3.375 g     3.375 g 12.5 mL/hr over 240 Minutes Intravenous Every 8 hours 12/26/14 1734         Assessment/Plan 1. Chronic/acute cholecystitis/cholelithiasis -INR is still 1.45, Cr. Improved, WBC normal, LFT's normal -He has been NPO and no blood thinners - proceed with perc chole drain.  Since he's been hurting for several weeks prior to coming in we worry he would need open chole which would have him at higher risk surgically for complications in peri-op period  -Ambulate and IS 2. ASCAD with hx of CABG; Cardiac cath 07/2014 showing occluded SVG to distal LAD, high grade proximal 95% SVG to RCA, severe native CAD with total occlusion of the mid RCA, total occlusion of prox LAD and total occlusion of the prox LCX, patent LIMA to LAD with 50-70% ostial/proximal LIMA and underwent BMS of the SVG to RCA. BMS was done due to history GI bleeding in  the past with chronic anemia on anticoagulation therapy for afib.  3. 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 Chronic atrial fibrillation rate controlled on chronic Coumadin; INR 2.53 5. Stage III chronic renal disease 6. COPD with chronic dyspnea 7. HTN 8.Hx of hypertension 9.GERD 10. Hyperlipidemia    LOS: 3 days    Coralie Keens 12/28/2014, 8:52 AM Pager: [phone]

## 2014-12-29 LAB — COMPREHENSIVE METABOLIC PANEL
ALT: 24 U/L (ref 0–53)
AST: 23 U/L (ref 0–37)
Albumin: 3 g/dL — ABNORMAL LOW (ref 3.5–5.2)
Alkaline Phosphatase: 54 U/L (ref 39–117)
Anion gap: 7 (ref 5–15)
BUN: 21 mg/dL (ref 6–23)
CO2: 27 mmol/L (ref 19–32)
Calcium: 8.4 mg/dL (ref 8.4–10.5)
Chloride: 105 mmol/L (ref 96–112)
Creatinine, Ser: 1.79 mg/dL — ABNORMAL HIGH (ref 0.50–1.35)
GFR calc Af Amer: 41 mL/min — ABNORMAL LOW (ref >90)
GFR calc non Af Amer: 35 mL/min — ABNORMAL LOW (ref >90)
Glucose, Bld: 74 mg/dL (ref 70–99)
Potassium: 4.5 mmol/L (ref 3.5–5.1)
Sodium: 139 mmol/L (ref 135–145)
Total Bilirubin: 0.6 mg/dL (ref 0.3–1.2)
Total Protein: 6.1 g/dL (ref 6.0–8.3)

## 2014-12-29 LAB — CBC
HCT: 31.9 % — ABNORMAL LOW (ref 39.0–52.0)
Hemoglobin: 10.1 g/dL — ABNORMAL LOW (ref 13.0–17.0)
MCH: 30.6 pg (ref 26.0–34.0)
MCHC: 31.7 g/dL (ref 30.0–36.0)
MCV: 96.7 fL (ref 78.0–100.0)
Platelets: 278 10*3/uL (ref 150–400)
RBC: 3.3 MIL/uL — ABNORMAL LOW (ref 4.22–5.81)
RDW: 14.5 % (ref 11.5–15.5)
WBC: 10.3 10*3/uL (ref 4.0–10.5)

## 2014-12-29 LAB — HEPARIN LEVEL (UNFRACTIONATED): Heparin Unfractionated: 0.23 IU/mL — ABNORMAL LOW (ref 0.30–0.70)

## 2014-12-29 MED ORDER — HEPARIN (PORCINE) IN NACL 100-0.45 UNIT/ML-% IJ SOLN
1450.0000 [IU]/h | INTRAMUSCULAR | Status: DC
  Administered 2014-12-29: 1300 [IU]/h via INTRAVENOUS
  Administered 2014-12-30: 1450 [IU]/h via INTRAVENOUS
  Filled 2014-12-29 (×3): qty 250

## 2014-12-29 MED ORDER — WARFARIN - PHARMACIST DOSING INPATIENT
Freq: Every day | Status: DC
  Administered 2014-12-30: 1

## 2014-12-29 MED ORDER — POLYETHYLENE GLYCOL 3350 17 G PO PACK
17.0000 g | PACK | Freq: Every day | ORAL | Status: DC
  Administered 2014-12-29 – 2014-12-31 (×3): 17 g via ORAL
  Filled 2014-12-29 (×3): qty 1

## 2014-12-29 MED ORDER — DOCUSATE SODIUM 100 MG PO CAPS
100.0000 mg | ORAL_CAPSULE | Freq: Two times a day (BID) | ORAL | Status: DC
  Administered 2014-12-29 – 2014-12-31 (×5): 100 mg via ORAL
  Filled 2014-12-29 (×5): qty 1

## 2014-12-29 MED ORDER — WARFARIN SODIUM 7.5 MG PO TABS
7.5000 mg | ORAL_TABLET | Freq: Once | ORAL | Status: AC
  Administered 2014-12-29: 7.5 mg via ORAL
  Filled 2014-12-29 (×2): qty 1

## 2014-12-29 MED ORDER — SODIUM CHLORIDE 0.9 % IV SOLN
INTRAVENOUS | Status: DC
  Administered 2014-12-29: via INTRAVENOUS

## 2014-12-29 NOTE — Progress Notes (Signed)
ANTICOAGULATION CONSULT NOTE - Follow Up Consult  Pharmacy Consult for heparin Indication: atrial fibrillation  No Known Allergies  Patient Measurements: Height: 5' 10.5" (179.1 cm) Weight: 209 lb 14.1 oz (95.2 kg) IBW/kg (Calculated) : 74.15 Heparin Dosing Weight: 93 kg  Vital Signs: Temp: 98.9 F (37.2 C) (03/27 1644) Temp Source: Oral (03/27 1644) BP: 133/55 mmHg (03/27 0745) Pulse Rate: 75 (03/27 0745)  Labs:  Recent Labs  12/27/14 0741 12/27/14 1958 12/28/14 0508 12/29/14 0245 12/29/14 1602  HGB 10.6*  --  10.7* 10.1*  --   HCT 32.9*  --  33.5* 31.9*  --   PLT 315  --  319 278  --   LABPROT 21.0* 18.3* 17.8*  --   --   INR 1.80* 1.50* 1.45  --   --   HEPARINUNFRC  --   --   --   --  0.23*  CREATININE 1.76*  --  1.82* 1.79*  --     Estimated Creatinine Clearance: 41.7 mL/min (by C-G formula based on Cr of 1.79).   Medications:  Scheduled:  . aspirin EC  81 mg Oral Q M,W,F  . budesonide  0.25 mg Nebulization BID  . cholecalciferol  5,000 Units Oral Daily  . docusate sodium  100 mg Oral BID  . ferrous sulfate  325 mg Oral BID WC  . hydrALAZINE  25 mg Oral TID  . isosorbide-hydrALAZINE  1 tablet Oral TID  . levofloxacin (LEVAQUIN) IV  500 mg Intravenous Once  . levothyroxine  100 mcg Oral QAC breakfast  . ondansetron (ZOFRAN) IV  8 mg Intravenous Once  . piperacillin-tazobactam (ZOSYN)  IV  3.375 g Intravenous Q8H  . polyethylene glycol  17 g Oral Daily  . potassium chloride  10 mEq Oral Daily  . pravastatin  40 mg Oral q1800  . sodium chloride  3 mL Intravenous Q12H  . warfarin  7.5 mg Oral ONCE-1800  . Warfarin - Pharmacist Dosing Inpatient   Does not apply q1800   Infusions:  . sodium chloride 10 mL (12/29/14 0752)  . heparin 1,300 Units/hr (12/29/14 0930)    Assessment: 74 yo male with afib is currently on subtherapeutic heparin.  Heparin level is 0.23 (drawn a little early but don't expect it to be therapeutic).  Goal of Therapy:  Heparin  level 0.3-0.7 units/ml Monitor platelets by anticoagulation protocol: Yes   Plan:  - increase heparin to 1450 units/hr - 8hr heparin level  Aahana Elza, Tsz-Yin 12/29/2014,4:51 PM

## 2014-12-29 NOTE — Progress Notes (Signed)
ANTICOAGULATION CONSULT NOTE - Initial Consult  Pharmacy Consult for Heparin & Warfarin Indication: atrial fibrillation  No Known Allergies  Patient Measurements: Height: 5' 10.5" (179.1 cm) Weight: 209 lb 14.1 oz (95.2 kg) IBW/kg (Calculated) : 74.15  Vital Signs: Temp: 98.4 F (36.9 C) (03/27 0329) Temp Source: Oral (03/27 0329) BP: 123/46 mmHg (03/27 0329) Pulse Rate: 79 (03/27 0329)  Labs:  Recent Labs  12/27/14 0741 12/27/14 1958 12/28/14 0508 12/29/14 0245  HGB 10.6*  --  10.7* 10.1*  HCT 32.9*  --  33.5* 31.9*  PLT 315  --  319 278  LABPROT 21.0* 18.3* 17.8*  --   INR 1.80* 1.50* 1.45  --   CREATININE 1.76*  --  1.82* 1.79*    Estimated Creatinine Clearance: 41.7 mL/min (by C-G formula based on Cr of 1.79).   Medical History: Past Medical History  Diagnosis Date  . Unspecified essential hypertension   . Anemia   . Asthma   . Hepatomegaly   . Esophageal reflux   . Other and unspecified coagulation defects   . Hiatal hernia   . Personal history of colonic polyps 05/29/2010    TUBULAR ADENOMA  . CAD (coronary artery disease)   . Hypothyroidism   . Gout   . Hyperlipidemia   . Gastric antral vascular ectasia 2013  . Atrial fibrillation   . History of blood transfusion ~ 2012    "blood count dropped; had to get 3 units"  . Arthritis     "hips; back" (07/09/2014)  . On home oxygen therapy     "2L at night" (07/09/2014)  . COPD (chronic obstructive pulmonary disease)     Medications:  Prescriptions prior to admission  Medication Sig Dispense Refill Last Dose  . acetaminophen (TYLENOL) 500 MG tablet Take 1,000 mg by mouth every 6 (six) hours as needed. For pain.    12/25/2014 at Unknown time  . albuterol (PROVENTIL HFA;VENTOLIN HFA) 108 (90 BASE) MCG/ACT inhaler Inhale 2 puffs into the lungs every 6 (six) hours as needed. For shortness of breath. 1 Inhaler 3 12/25/2014 at Unknown time  . aspirin EC 81 MG tablet Take 1 tablet (81 mg total) by mouth every  Monday, Wednesday, and Friday.   12/25/2014 at Unknown time  . budesonide (PULMICORT) 0.25 MG/2ML nebulizer solution Take 0.25 mg by nebulization 2 (two) times daily.    12/25/2014 at Unknown time  . Cholecalciferol (VITAMIN D3) 5000 UNITS CAPS Take 5,000 Units by mouth daily.    12/25/2014 at Unknown time  . esomeprazole (NEXIUM) 40 MG capsule Take twice daily on an empty stomach 60 capsule 3 12/25/2014 at Unknown time  . ferrous sulfate 325 (65 FE) MG tablet Take 325 mg by mouth 2 (two) times daily with a meal.    12/25/2014 at Unknown time  . furosemide (LASIX) 40 MG tablet Take 1 tablet (40 mg total) by mouth 2 (two) times daily. 60 tablet 6 12/25/2014 at Unknown time  . hydrALAZINE (APRESOLINE) 25 MG tablet Take 1 tablet (25 mg total) by mouth 3 (three) times daily. 90 tablet 6 12/25/2014 at Unknown time  . HYDROcodone-acetaminophen (NORCO/VICODIN) 5-325 MG per tablet Take 1 tablet by mouth every 6 (six) hours as needed for moderate pain. 30 tablet 0 12/25/2014 at Unknown time  . ipratropium-albuterol (DUONEB) 0.5-2.5 (3) MG/3ML SOLN Take 3 mLs by nebulization every 6 (six) hours as needed. For shortness of breath.    12/25/2014 at Unknown time  . isosorbide-hydrALAZINE (BIDIL) 20-37.5 MG per tablet  Take 1 tablet by mouth 3 (three) times daily. 90 tablet 11 12/25/2014 at Unknown time  . levothyroxine (SYNTHROID, LEVOTHROID) 112 MCG tablet Take 1 tablet (112 mcg total) by mouth daily before breakfast. 30 tablet 2 12/25/2014 at Unknown time  . lovastatin (MEVACOR) 40 MG tablet Take 1 tablet (40 mg total) by mouth at bedtime. 90 tablet 2 12/24/2014 at Unknown time  . Multiple Vitamins-Minerals (MULTIVITAMINS THER. W/MINERALS) TABS Take 1 tablet by mouth daily.     12/25/2014 at Unknown time  . nitroGLYCERIN (NITROSTAT) 0.4 MG SL tablet Place 0.4 mg under the tongue every 5 (five) minutes as needed for chest pain.   unknown at unknown  . potassium chloride (K-DUR) 10 MEQ tablet Take 1 tablet (10 mEq total) by  mouth daily. 30 tablet 5 12/25/2014 at Unknown time  . warfarin (COUMADIN) 5 MG tablet Take 2.5-5 mg by mouth daily. Take 5 mg Monday, Tuesday, Wednesday, Friday, and Sat. Take 2.5 mg on Thursday and Sunday   12/25/2014 at Unknown time    Assessment: 74 yo M admitted 12/25/2014  With acute cholecystitis now s/p perc drain placed 3/26.  Pharmacy consulted to resume anticoagulation with heparin and warfarin on 3.27.  PMH:  HTN, anemia, asthma, GERD, CAD, hypothyroid, HLD, afib, OA, COPD  Coag: Afib, PTA warfarin 5 mg M,T, W,F,S and 2.5 mg Th & Su.  vitamin K 5 mg iv x1 on 03/25)INR < goal, H/H low but stable post drain.   CHADSVASC = 4  ID: Afebrile, WBC WNL Zosyn 3/24>   Renal: Scr slightly down, CrCl ~40 ml/min , lytes ok  Heme/Onc: Hx anemia - ferrous sulfate.   Best practices: therapeutic SCDs   Goal of Therapy:  INR 2-3 Heparin level 0.3-0.7 units/ml Monitor platelets by anticoagulation protocol: Yes   Plan:  Heparin 1300 units/hr, no bolus due to recent drain placement Warfarin 7.5 mg today (~1.5 x average daily dose) Daily INR, CBC, and HL Check heparin level 8h after ggt starts. *Note difference between home dose of levothryroxine (154mcg) v hosptial dose (100 mcg)  Thank you for allowing pharmacy to be a part of this patients care team.  Rowe Robert Pharm.D., BCPS, AQ-Cardiology Clinical Pharmacist 12/29/2014 8:17 AM Pager: [phone] Phone: [phone]

## 2014-12-29 NOTE — Progress Notes (Signed)
Referring Physician(s): CCS  Subjective:  Perc cholecystostomy drain placed 3/26 Doing well this am Output bilious   Allergies: Review of patient's allergies indicates no known allergies.  Medications: Prior to Admission medications   Medication Sig Start Date End Date Taking? Authorizing Provider  acetaminophen (TYLENOL) 500 MG tablet Take 1,000 mg by mouth every 6 (six) hours as needed. For pain.    Yes Historical Provider, MD  albuterol (PROVENTIL HFA;VENTOLIN HFA) 108 (90 BASE) MCG/ACT inhaler Inhale 2 puffs into the lungs every 6 (six) hours as needed. For shortness of breath. 11/22/14  Yes Chipper Herb, MD  aspirin EC 81 MG tablet Take 1 tablet (81 mg total) by mouth every Monday, Wednesday, and Friday. 10/08/14  Yes Belva Crome, MD  budesonide (PULMICORT) 0.25 MG/2ML nebulizer solution Take 0.25 mg by nebulization 2 (two) times daily.    Yes Historical Provider, MD  Cholecalciferol (VITAMIN D3) 5000 UNITS CAPS Take 5,000 Units by mouth daily.    Yes Historical Provider, MD  esomeprazole (NEXIUM) 40 MG capsule Take twice daily on an empty stomach 12/20/14  Yes Claretta Fraise, MD  ferrous sulfate 325 (65 FE) MG tablet Take 325 mg by mouth 2 (two) times daily with a meal.    Yes Historical Provider, MD  furosemide (LASIX) 40 MG tablet Take 1 tablet (40 mg total) by mouth 2 (two) times daily. 10/17/14  Yes Belva Crome, MD  hydrALAZINE (APRESOLINE) 25 MG tablet Take 1 tablet (25 mg total) by mouth 3 (three) times daily. 10/30/14  Yes Belva Crome, MD  HYDROcodone-acetaminophen (NORCO/VICODIN) 5-325 MG per tablet Take 1 tablet by mouth every 6 (six) hours as needed for moderate pain. 11/28/14  Yes Lezlie Octave Black, NP  ipratropium-albuterol (DUONEB) 0.5-2.5 (3) MG/3ML SOLN Take 3 mLs by nebulization every 6 (six) hours as needed. For shortness of breath.    Yes Historical Provider, MD  isosorbide-hydrALAZINE (BIDIL) 20-37.5 MG per tablet Take 1 tablet by mouth 3 (three) times daily.  06/25/14  Yes Liliane Shi, PA-C  levothyroxine (SYNTHROID, LEVOTHROID) 112 MCG tablet Take 1 tablet (112 mcg total) by mouth daily before breakfast. 12/12/14  Yes Claretta Fraise, MD  lovastatin (MEVACOR) 40 MG tablet Take 1 tablet (40 mg total) by mouth at bedtime. 12/07/13  Yes Belva Crome, MD  Multiple Vitamins-Minerals (MULTIVITAMINS THER. W/MINERALS) TABS Take 1 tablet by mouth daily.     Yes Historical Provider, MD  nitroGLYCERIN (NITROSTAT) 0.4 MG SL tablet Place 0.4 mg under the tongue every 5 (five) minutes as needed for chest pain.   Yes Historical Provider, MD  potassium chloride (K-DUR) 10 MEQ tablet Take 1 tablet (10 mEq total) by mouth daily. 10/09/14  Yes Belva Crome, MD  warfarin (COUMADIN) 5 MG tablet Take 2.5-5 mg by mouth daily. Take 5 mg Monday, Tuesday, Wednesday, Friday, and Sat. Take 2.5 mg on Thursday and Sunday   Yes Historical Provider, MD     Vital Signs: BP 133/55 mmHg  Pulse 75  Temp(Src) 98.5 F (36.9 C) (Oral)  Resp 14  Ht 5' 10.5" (1.791 m)  Wt 95.2 kg (209 lb 14.1 oz)  BMI 29.68 kg/m2  SpO2 98%  Physical Exam  Abdominal: Soft. Bowel sounds are normal. There is no tenderness.  Chole drain site is clean and dry NT; no bleeding Output 30 cc yesterday 15 cc this am; 20 cc in bag Bilious fluid cx pending afeb     Imaging: Dg Chest 2 View  12/26/2014   CLINICAL DATA:  Acute onset of nausea. Preoperative chest radiograph for gallbladder surgery. Initial encounter.  EXAM: CHEST  2 VIEW  COMPARISON:  Chest radiograph performed 11/28/2014  FINDINGS: The lungs are well-aerated. Minimal bibasilar atelectasis is noted. There is no evidence of pleural effusion or pneumothorax.  The heart is borderline enlarged. The patient is status post median sternotomy, with evidence of prior CABG. No acute osseous abnormalities are seen.  IMPRESSION: Minimal bibasilar atelectasis noted.  Borderline cardiomegaly.   Electronically Signed   By: Garald Balding M.D.   On:  12/26/2014 01:46   Ir Perc Cholecystostomy  12/28/2014   INDICATION: 74 year old male with a history of acute cholecystitis. The patient has been assessed as a high surgical risk for complications. He has been referred by surgery for percutaneous cholecystostomy tube.  EXAM: ULTRASOUND AND FLUOROSCOPIC-GUIDED CHOLECYSTOSTOMY TUBE PLACEMENT  COMPARISON:  Ultrasound 12/25/2014, CT 11/26/2014  MEDICATIONS: Fentanyl 125 mcg IV; Versed 3.0 mg IV;  Levaquin 500 mg IV administered.  25 mg Demerol.  ANESTHESIA/SEDATION: Total Moderate Sedation Time  Forty-five minutes  CONTRAST:  40mL OMNIPAQUE IOHEXOL 300 MG/ML  SOLN  FLUOROSCOPY TIME:  2 minutes 6 seconds  COMPLICATIONS: SIR category 2  PROCEDURE: Informed written consent was obtained from the patient after a discussion of the risks, benefits and alternatives to treatment. Questions regarding the procedure were encouraged and answered. A timeout was performed prior to the initiation of the procedure.  The right upper abdominal quadrant was prepped and draped in the usual sterile fashion, and a sterile drape was applied covering the operative field. Maximum barrier sterile technique with sterile gowns and gloves were used for the procedure. A timeout was performed prior to the initiation of the procedure. Local anesthesia was provided with 1% lidocaine with epinephrine.  Ultrasound scanning of the right upper quadrant demonstrates a markedly dilated gallbladder. Of note, the patient reported pain with ultrasound imaging over the gallbladder. Utilizing a transhepatic approach, a 22 gauge needle was advanced into the gallbladder under direct ultrasound guidance. An ultrasound image was saved for documentation purposes. Appropriate intraluminal puncture was confirmed with the efflux of bile and advancement of an 0.018 wire into the gallbladder lumen. The needle was exchanged for an [place] set. A small amount of contrast was injected to confirm appropriate intraluminal  positioning. Over a Benson wire, a 74.2-French Cook cholecystomy tube was advanced into the gallbladder fossa, coiled and locked. Bile was aspirated and a small amount of contrast was injected as several post procedural spot radiographic images were obtained in various obliquities. The catheter was secured to the skin with suture, connected to a drainage bag and a dressing was placed.  During the drain placement, the patient became hypotensive, complained of feeling warm, and experienced nausea with right upper quadrant pain.  No significant blood loss.  IMPRESSION: Status post placement of percutaneous cholecystostomy tube. A sample was sent to the lab for analysis.  Signed,  Dulcy Fanny. Earleen Newport, DO  [facility]  [facility]  PLAN: There is a concern for bacteremia/sepsis after the procedure, given that the patient became hypotensive, nauseated, and complained of feeling warm/febrile.  Levaquin 500 mg IV antibiotics will cover bacteremia.  Fluid bolus of 500 cc was ordered as well as 150 cc/hour of normal saline.  Demerol 25 mg.  Toradol 30 mg IV for 3 doses was ordered.  Zofran 8 mg IV.  The primary medical team was contacted for this result, and potential elevation to a  higher level of care was discussed.   Electronically Signed   By: Corrie Mckusick D.O.   On: 12/28/2014 15:28   US Abdomen Limited Ruq  12/25/2014   CLINICAL DATA:  Right upper quadrant pain  EXAM: US ABDOMEN LIMITED - RIGHT UPPER QUADRANT  COMPARISON:  11/26/14  FINDINGS: Gallbladder:  Again noted tumefactive sludge within gallbladder. There is progression of gallbladder wall thickening up to 9.8 mm. Findings are suspicious for acute cholecystitis and clinical correlation is necessary. There is positive sonographic Murphy's sign.  Common bile duct:  Diameter: 5.7 mm in diameter within normal limits.  Liver:  No focal lesion identified. Within normal limits in parenchymal echogenicity.  IMPRESSION: Again  noted tumefactive sludge within gallbladder. There is progression of gallbladder wall thickening up to 9.8 mm. Findings are suspicious for acute cholecystitis and clinical correlation is necessary. There is positive sonographic Murphy's sign.   Electronically Signed   By: Lahoma Crocker M.D.   On: 12/25/2014 18:28    Labs:  CBC:  Recent Labs  12/26/14 0707 12/27/14 0741 12/28/14 0508 12/29/14 0245  WBC 10.3 9.8 10.3 10.3  HGB 10.9* 10.6* 10.7* 10.1*  HCT 33.5* 32.9* 33.5* 31.9*  PLT 331 315 319 278    COAGS:  Recent Labs  12/26/14 1828 12/27/14 0741 12/27/14 1958 12/28/14 0508  INR 2.18* 1.80* 1.50* 1.45    BMP:  Recent Labs  12/26/14 0707 12/27/14 0741 12/28/14 0508 12/29/14 0245  NA 137 139 139 139  K 4.4 4.7 4.2 4.5  CL 104 102 99 105  CO2 24 27 35* 27  GLUCOSE 92 92 92 74  BUN 41* 30* 24* 21  CALCIUM 8.6 9.0 8.8 8.4  CREATININE 1.94* 1.76* 1.82* 1.79*  GFRNONAA 32* 36* 35* 35*  GFRAA 37* 42* 40* 41*    LIVER FUNCTION TESTS:  Recent Labs  12/26/14 0707 12/27/14 0741 12/28/14 0508 12/29/14 0245  BILITOT 0.7 0.6 0.8 0.6  AST 25 24 25 23   ALT 33 28 28 24   ALKPHOS 55 60 60 54  PROT 6.2 6.7 6.6 6.1  ALBUMIN 3.3* 3.6 3.4* 3.0*    Assessment and Plan:  Perc dhole drain placed 3/126 Must remain 6 weeks or more Plan per CCS  Signed: Kwamane Whack A 12/29/2014, 9:26 AM   I spent a total of 15 Minutes in face to face in clinical consultation/evaluation, greater than 50% of which was counseling/coordinating care for perc chole drain

## 2014-12-29 NOTE — Progress Notes (Signed)
PATIENT DETAILS Name: [name] [name] Age: 74 y.o. Sex: male Date of Birth: 06/05/1941 Admit Date: 12/25/2014 Admitting Physician Jani Gravel, MD [id number], Elenore Rota, MD  Subjective: Mild pain at RUQ site. Feels better and wants to eat.   Assessment/Plan: Active Problems:   Acute Cholecystitis:Admitted, kept NPO, started on empiric Zosyn and other supportive measures.Seen by cards, pre-operatively and was cleared to proceed with surgery. Patient was on coumadin and had to reverse it with Vitamin K in order to proceed with surgery. After further evaluation by Gen Surgery, it was felt that patient would benefit from placement of a percutaneous drainage rather than cholecystectomy. Subsequently underwent a cutaneous drainage placement on 3/26, drain in place and draining well. Continue empiric Zosyn, will start full liquids    Sepsis:secondary to above. Sepsis pathophysiology has resolved.    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 Continue ASA and statin    Atrial Fibrillation:coumadin reversed in anticipation of cholecystectomy/drain placement. We will resume overlapping INR at 1.4. Will resume coumadin with overlapping heparin-this M.D. had spoken with interventional radiology-Dr. Earleen Newport on 3/26 who had cleared patient for full dose anticoagulation..    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 Monitor lytes periodically.    Hx of Chronic Systolic CHF:EF has now normalized-last EF per echo on 10/14/14 was 55%. Remains clinically compensated. Continue Bidil, lasix remains on hold for now. Will resume once diet has been fully advanced.    Hypothyroidism:continue Levothyroxine    [id number] Bidil and hydralazine    COPD:Stable, lungs clear. Continue nebs.   Disposition: Remain inpatient-remain in step down for 24 hours,  Antibiotics:  See below   Anti-infectives    Start     Dose/Rate  Route Frequency Ordered Stop   12/28/14 1530  levofloxacin (LEVAQUIN) IVPB 500 mg     500 mg 100 mL/hr over 60 Minutes Intravenous  Once 12/28/14 1459     12/26/14 1800  piperacillin-tazobactam (ZOSYN) IVPB 3.375 g     3.375 g 12.5 mL/hr over 240 Minutes Intravenous Every 8 hours 12/26/14 1734        DVT Prophylaxis:  SCD's  Code Status: Full code   Family Communication None at bedsde this a.m.  Procedures:  None  CONSULTS:  general surgery and IR  MEDICATIONS: Scheduled Meds: . aspirin EC  81 mg Oral Q M,W,F  . budesonide  0.25 mg Nebulization BID  . cholecalciferol  5,000 Units Oral Daily  . docusate sodium  100 mg Oral BID  . ferrous sulfate  325 mg Oral BID WC  . hydrALAZINE  25 mg Oral TID  . isosorbide-hydrALAZINE  1 tablet Oral TID  . levofloxacin (LEVAQUIN) IV  500 mg Intravenous Once  . levothyroxine  100 mcg Oral QAC breakfast  . ondansetron (ZOFRAN) IV  8 mg Intravenous Once  . piperacillin-tazobactam (ZOSYN)  IV  3.375 g Intravenous Q8H  . polyethylene glycol  17 g Oral Daily  . potassium chloride  10 mEq Oral Daily  . pravastatin  40 mg Oral q1800  . sodium chloride  3 mL Intravenous Q12H  . warfarin  7.5 mg Oral ONCE-1800  . Warfarin - Pharmacist Dosing Inpatient   Does not apply q1800   Continuous Infusions: . sodium chloride 10 mL (12/29/14 0752)  . heparin 1,300 Units/hr (12/29/14 0930)   PRN Meds:.acetaminophen, HYDROmorphone (DILAUDID) injection, ipratropium-albuterol, nitroGLYCERIN, ondansetron (ZOFRAN) IV  PHYSICAL EXAM: Vital signs in last 24 hours: Filed Vitals:   12/28/14 2321 12/29/14 0203 12/29/14 0329 12/29/14 0745  BP: 143/51  123/46 133/55  Pulse: 85  79 75  Temp: 98.9 F (37.2 C)  98.4 F (36.9 C) 98.5 F (36.9 C)  TempSrc: Oral  Oral Oral  Resp: 14  18 14   Height:      Weight:   95.2 kg (209 lb 14.1 oz)   SpO2: 93% 97% 98% 98%    Weight change: 3.2 kg (7 lb 0.9 oz) Filed Weights   12/27/14 0551 12/28/14 0537  12/29/14 0329  Weight: 93 kg (205 lb 0.4 oz) 92 kg (202 lb 13.2 oz) 95.2 kg (209 lb 14.1 oz)   Body mass index is 29.68 kg/(m^2).   Gen Exam: Awake and alert with clear speech.   Neck: Supple, No JVD.   Chest: B/L Clear.  No rales or rhonchi CVS: S1 S2 Regular, no murmurs.  Abdomen: soft, BS +, mildly tender at RUQ  drain site, non distended.  Extremities: no edema, lower extremities warm to touch. Neurologic: Non Focal.   Skin: No Rash.   Wounds: N/A.   Intake/Output from previous day:  Intake/Output Summary (Last 24 hours) at 12/29/14 1217 Last data filed at 12/29/14 0800  Gross per 24 hour  Intake   1305 ml  Output    325 ml  Net    980 ml     LAB RESULTS: CBC  Recent Labs Lab 12/25/14 1502 12/26/14 0707 12/27/14 0741 12/28/14 0508 12/29/14 0245  WBC 11.7* 10.3 9.8 10.3 10.3  HGB 12.1* 10.9* 10.6* 10.7* 10.1*  HCT 37.1* 33.5* 32.9* 33.5* 31.9*  PLT 401* 331 315 319 278  MCV 94.2 95.4 95.4 95.7 96.7  MCH 30.7 31.1 30.7 30.6 30.6  MCHC 32.6 32.5 32.2 31.9 31.7  RDW 14.7 14.8 14.5 14.6 14.5  LYMPHSABS 0.9 1.0  --   --   --   MONOABS 1.5* 1.6*  --   --   --   EOSABS 0.5 0.8*  --   --   --   BASOSABS 0.1 0.1  --   --   --     Chemistries   Recent Labs Lab 12/25/14 1502 12/26/14 0707 12/27/14 0741 12/28/14 0508 12/29/14 0245  NA 138 137 139 139 139  K 5.0 4.4 4.7 4.2 4.5  CL 101 104 102 99 105  CO2 27 24 27  35* 27  GLUCOSE 105* 92 92 92 74  BUN 43* 41* 30* 24* 21  CREATININE 2.19* 1.94* 1.76* 1.82* 1.79*  CALCIUM 9.2 8.6 9.0 8.8 8.4    CBG: No results for input(s): GLUCAP in the last 168 hours.  GFR Estimated Creatinine Clearance: 41.7 mL/min (by C-G formula based on Cr of 1.79).  Coagulation profile  Recent Labs Lab 12/26/14 0707 12/26/14 1828 12/27/14 0741 12/27/14 1958 12/28/14 0508  INR 2.53* 2.18* 1.80* 1.50* 1.45    Cardiac Enzymes  Recent Labs Lab 12/25/14 2031  TROPONINI 0.04*    Invalid input(s): POCBNP No results  for input(s): DDIMER in the last 72 hours. No results for input(s): HGBA1C in the last 72 hours. No results for input(s): CHOL, HDL, LDLCALC, TRIG, CHOLHDL, LDLDIRECT in the last 72 hours. No results for input(s): TSH, T4TOTAL, T3FREE, THYROIDAB in the last 72 hours.  Invalid input(s): FREET3 No results for input(s): VITAMINB12, FOLATE, FERRITIN, TIBC, IRON, RETICCTPCT in the last 72 hours.  Recent Labs  12/27/14 0741  LIPASE 27  Urine Studies No results for input(s): UHGB, CRYS in the last 72 hours.  Invalid input(s): UACOL, UAPR, USPG, UPH, UTP, UGL, UKET, UBIL, UNIT, UROB, ULEU, UEPI, UWBC, URBC, UBAC, CAST, UCOM, BILUA  MICROBIOLOGY: Recent Results (from the past 240 hour(s))  Culture, blood (routine x 2)     Status: None (Preliminary result)   Collection Time: 12/26/14  6:30 PM  Result Value Ref Range Status   Specimen Description BLOOD LEFT ARM  Final   Special Requests BOTTLES DRAWN AEROBIC AND ANAEROBIC 10CC  Final   Culture   Final           BLOOD CULTURE RECEIVED NO GROWTH TO DATE CULTURE WILL BE HELD FOR 5 DAYS BEFORE ISSUING A FINAL NEGATIVE REPORT Note: Culture results may be compromised due to an excessive volume of blood received in culture bottles. Performed at [facility]    Report Status PENDING  Incomplete  Culture, blood (routine x 2)     Status: None (Preliminary result)   Collection Time: 12/26/14  6:40 PM  Result Value Ref Range Status   Specimen Description BLOOD LEFT HAND  Final   Special Requests BOTTLES DRAWN AEROBIC ONLY 6CC  Final   Culture   Final           BLOOD CULTURE RECEIVED NO GROWTH TO DATE CULTURE WILL BE HELD FOR 5 DAYS BEFORE ISSUING A FINAL NEGATIVE REPORT Performed at [facility]    Report Status PENDING  Incomplete    RADIOLOGY STUDIES/RESULTS: Dg Chest 2 View  12/26/2014   CLINICAL DATA:  Acute onset of nausea. Preoperative chest radiograph for gallbladder surgery. Initial encounter.  EXAM: CHEST  2 VIEW   COMPARISON:  Chest radiograph performed 11/28/2014  FINDINGS: The lungs are well-aerated. Minimal bibasilar atelectasis is noted. There is no evidence of pleural effusion or pneumothorax.  The heart is borderline enlarged. The patient is status post median sternotomy, with evidence of prior CABG. No acute osseous abnormalities are seen.  IMPRESSION: Minimal bibasilar atelectasis noted.  Borderline cardiomegaly.   Electronically Signed   By: Garald Balding M.D.   On: 12/26/2014 01:46   Ir Perc Cholecystostomy  12/28/2014   INDICATION: 74 year old male with a history of acute cholecystitis. The patient has been assessed as a high surgical risk for complications. He has been referred by surgery for percutaneous cholecystostomy tube.  EXAM: ULTRASOUND AND FLUOROSCOPIC-GUIDED CHOLECYSTOSTOMY TUBE PLACEMENT  COMPARISON:  Ultrasound 12/25/2014, CT 11/26/2014  MEDICATIONS: Fentanyl 125 mcg IV; Versed 3.0 mg IV;  Levaquin 500 mg IV administered.  25 mg Demerol.  ANESTHESIA/SEDATION: Total Moderate Sedation Time  Forty-five minutes  CONTRAST:  88mL OMNIPAQUE IOHEXOL 300 MG/ML  SOLN  FLUOROSCOPY TIME:  2 minutes 6 seconds  COMPLICATIONS: SIR category 2  PROCEDURE: Informed written consent was obtained from the patient after a discussion of the risks, benefits and alternatives to treatment. Questions regarding the procedure were encouraged and answered. A timeout was performed prior to the initiation of the procedure.  The right upper abdominal quadrant was prepped and draped in the usual sterile fashion, and a sterile drape was applied covering the operative field. Maximum barrier sterile technique with sterile gowns and gloves were used for the procedure. A timeout was performed prior to the initiation of the procedure. Local anesthesia was provided with 1% lidocaine with epinephrine.  Ultrasound scanning of the right upper quadrant demonstrates a markedly dilated gallbladder. Of note, the patient reported pain with  ultrasound imaging over the gallbladder. Utilizing a  transhepatic approach, a 22 gauge needle was advanced into the gallbladder under direct ultrasound guidance. An ultrasound image was saved for documentation purposes. Appropriate intraluminal puncture was confirmed with the efflux of bile and advancement of an 0.018 wire into the gallbladder lumen. The needle was exchanged for an [place] set. A small amount of contrast was injected to confirm appropriate intraluminal positioning. Over a Benson wire, a 77.2-French Cook cholecystomy tube was advanced into the gallbladder fossa, coiled and locked. Bile was aspirated and a small amount of contrast was injected as several post procedural spot radiographic images were obtained in various obliquities. The catheter was secured to the skin with suture, connected to a drainage bag and a dressing was placed.  During the drain placement, the patient became hypotensive, complained of feeling warm, and experienced nausea with right upper quadrant pain.  No significant blood loss.  IMPRESSION: Status post placement of percutaneous cholecystostomy tube. A sample was sent to the lab for analysis.  Signed,  Dulcy Fanny. Earleen Newport, DO  [facility]  [facility] Radiology  PLAN: There is a concern for bacteremia/sepsis after the procedure, given that the patient became hypotensive, nauseated, and complained of feeling warm/febrile.  Levaquin 500 mg IV antibiotics will cover bacteremia.  Fluid bolus of 500 cc was ordered as well as 150 cc/hour of normal saline.  Demerol 25 mg.  Toradol 30 mg IV for 3 doses was ordered.  Zofran 8 mg IV.  The primary medical team was contacted for this result, and potential elevation to a higher level of care was discussed.   Electronically Signed   By: Corrie Mckusick D.O.   On: 12/28/2014 15:28   US Abdomen Limited Ruq  12/25/2014   CLINICAL DATA:  Right upper quadrant pain  EXAM: US ABDOMEN LIMITED - RIGHT UPPER  QUADRANT  COMPARISON:  11/26/14  FINDINGS: Gallbladder:  Again noted tumefactive sludge within gallbladder. There is progression of gallbladder wall thickening up to 9.8 mm. Findings are suspicious for acute cholecystitis and clinical correlation is necessary. There is positive sonographic Murphy's sign.  Common bile duct:  Diameter: 5.7 mm in diameter within normal limits.  Liver:  No focal lesion identified. Within normal limits in parenchymal echogenicity.  IMPRESSION: Again noted tumefactive sludge within gallbladder. There is progression of gallbladder wall thickening up to 9.8 mm. Findings are suspicious for acute cholecystitis and clinical correlation is necessary. There is positive sonographic Murphy's sign.   Electronically Signed   By: Lahoma Crocker M.D.   On: 12/25/2014 18:28    Oren Binet, MD  [facility] Pager:[phone]  If 7PM-7AM, please contact night-coverage [email or web address] Password [facility] 12/29/2014, 12:17 PM   LOS: 4 days

## 2014-12-29 NOTE — Progress Notes (Signed)
Subjective: Feels better since drained placed.  No significant abdominal pain.  Objective: Vital signs in last 24 hours: Temp:  [97.3 F (36.3 C)-98.9 F (37.2 C)] 98.5 F (36.9 C) (03/27 0745) Pulse Rate:  [74-98] 75 (03/27 0745) Resp:  [14-22] 14 (03/27 0745) BP: (117-177)/(46-94) 133/55 mmHg (03/27 0745) SpO2:  [90 %-100 %] 98 % (03/27 0745) Weight:  [95.2 kg (209 lb 14.1 oz)] 95.2 kg (209 lb 14.1 oz) (03/27 0329) Last BM Date: 12/23/14  Intake/Output from previous day: 03/26 0701 - 03/27 0700 In: 1295 [I.V.:1295] Out: 515 [Urine:500; Drains:15] Intake/Output this shift: Total I/O In: 10 [I.V.:10] Out: 10 [Drains:10]  PE: General- In NAD Abdomen-soft, not tender, bilious drain output  Lab Results:   Recent Labs  12/28/14 0508 12/29/14 0245  WBC 10.3 10.3  HGB 10.7* 10.1*  HCT 33.5* 31.9*  PLT 319 278   BMET  Recent Labs  12/28/14 0508 12/29/14 0245  NA 139 139  K 4.2 4.5  CL 99 105  CO2 35* 27  GLUCOSE 92 74  BUN 24* 21  CREATININE 1.82* 1.79*  CALCIUM 8.8 8.4   PT/INR  Recent Labs  12/27/14 1958 12/28/14 0508  LABPROT 18.3* 17.8*  INR 1.50* 1.45   Comprehensive Metabolic Panel:    Component Value Date/Time   NA 139 12/29/2014 0245   NA 139 12/28/2014 0508   NA 139 12/23/2014 1124   NA 139 12/11/2014 0826   K 4.5 12/29/2014 0245   K 4.2 12/28/2014 0508   CL 105 12/29/2014 0245   CL 99 12/28/2014 0508   CO2 27 12/29/2014 0245   CO2 35* 12/28/2014 0508   BUN 21 12/29/2014 0245   BUN 24* 12/28/2014 0508   BUN 34* 12/23/2014 1124   BUN 20 12/11/2014 0826   CREATININE 1.79* 12/29/2014 0245   CREATININE 1.82* 12/28/2014 0508   GLUCOSE 74 12/29/2014 0245   GLUCOSE 92 12/28/2014 0508   GLUCOSE 96 12/23/2014 1124   GLUCOSE 91 12/11/2014 0826   CALCIUM 8.4 12/29/2014 0245   CALCIUM 8.8 12/28/2014 0508   AST 23 12/29/2014 0245   AST 25 12/28/2014 0508   ALT 24 12/29/2014 0245   ALT 28 12/28/2014 0508   ALKPHOS 54 12/29/2014 0245    ALKPHOS 60 12/28/2014 0508   BILITOT 0.6 12/29/2014 0245   BILITOT 0.8 12/28/2014 0508   BILITOT 0.2 12/23/2014 1124   PROT 6.1 12/29/2014 0245   PROT 6.6 12/28/2014 0508   PROT 6.5 12/23/2014 1124   PROT 7.5 07/16/2013 0957   ALBUMIN 3.0* 12/29/2014 0245   ALBUMIN 3.4* 12/28/2014 0508     Studies/Results: Ir Perc Cholecystostomy  12/28/2014   INDICATION: 74 year old male with a history of acute cholecystitis. The patient has been assessed as a high surgical risk for complications. He has been referred by surgery for percutaneous cholecystostomy tube.  EXAM: ULTRASOUND AND FLUOROSCOPIC-GUIDED CHOLECYSTOSTOMY TUBE PLACEMENT  COMPARISON:  Ultrasound 12/25/2014, CT 11/26/2014  MEDICATIONS: Fentanyl 125 mcg IV; Versed 3.0 mg IV;  Levaquin 500 mg IV administered.  25 mg Demerol.  ANESTHESIA/SEDATION: Total Moderate Sedation Time  Forty-five minutes  CONTRAST:  4mL OMNIPAQUE IOHEXOL 300 MG/ML  SOLN  FLUOROSCOPY TIME:  2 minutes 6 seconds  COMPLICATIONS: SIR category 2  PROCEDURE: Informed written consent was obtained from the patient after a discussion of the risks, benefits and alternatives to treatment. Questions regarding the procedure were encouraged and answered. A timeout was performed prior to the initiation of the procedure.  The right upper abdominal  quadrant was prepped and draped in the usual sterile fashion, and a sterile drape was applied covering the operative field. Maximum barrier sterile technique with sterile gowns and gloves were used for the procedure. A timeout was performed prior to the initiation of the procedure. Local anesthesia was provided with 1% lidocaine with epinephrine.  Ultrasound scanning of the right upper quadrant demonstrates a markedly dilated gallbladder. Of note, the patient reported pain with ultrasound imaging over the gallbladder. Utilizing a transhepatic approach, a 22 gauge needle was advanced into the gallbladder under direct ultrasound guidance. An  ultrasound image was saved for documentation purposes. Appropriate intraluminal puncture was confirmed with the efflux of bile and advancement of an 0.018 wire into the gallbladder lumen. The needle was exchanged for an [place] set. A small amount of contrast was injected to confirm appropriate intraluminal positioning. Over a Benson wire, a 71.2-French Cook cholecystomy tube was advanced into the gallbladder fossa, coiled and locked. Bile was aspirated and a small amount of contrast was injected as several post procedural spot radiographic images were obtained in various obliquities. The catheter was secured to the skin with suture, connected to a drainage bag and a dressing was placed.  During the drain placement, the patient became hypotensive, complained of feeling warm, and experienced nausea with right upper quadrant pain.  No significant blood loss.  IMPRESSION: Status post placement of percutaneous cholecystostomy tube. A sample was sent to the lab for analysis.  Signed,  Dulcy Fanny. Earleen Newport, DO  [facility]  [facility]  PLAN: There is a concern for bacteremia/sepsis after the procedure, given that the patient became hypotensive, nauseated, and complained of feeling warm/febrile.  Levaquin 500 mg IV antibiotics will cover bacteremia.  Fluid bolus of 500 cc was ordered as well as 150 cc/hour of normal saline.  Demerol 25 mg.  Toradol 30 mg IV for 3 doses was ordered.  Zofran 8 mg IV.  The primary medical team was contacted for this result, and potential elevation to a higher level of care was discussed.   Electronically Signed   By: Corrie Mckusick D.O.   On: 12/28/2014 15:28    Anti-infectives: Anti-infectives    Start     Dose/Rate Route Frequency Ordered Stop   12/28/14 1530  levofloxacin (LEVAQUIN) IVPB 500 mg     500 mg 100 mL/hr over 60 Minutes Intravenous  Once 12/28/14 1459     12/26/14 1800  piperacillin-tazobactam (ZOSYN) IVPB 3.375 g     3.375  g 12.5 mL/hr over 240 Minutes Intravenous Every 8 hours 12/26/14 1734        Assessment Acute cholecystitis s/p perc drain placement-some leakage of bile during procedure but no evidence of peritonitis or sepsis   LOS: 4 days   Plan: Can be transferred back to floor from our standpoint. Advance diet.  Can restart Coumadin.   [name] [name] 12/29/2014

## 2014-12-30 LAB — CBC
HEMATOCRIT: 32.8 % — AB (ref 39.0–52.0)
Hemoglobin: 10.5 g/dL — ABNORMAL LOW (ref 13.0–17.0)
MCH: 30.9 pg (ref 26.0–34.0)
MCHC: 32 g/dL (ref 30.0–36.0)
MCV: 96.5 fL (ref 78.0–100.0)
Platelets: 272 10*3/uL (ref 150–400)
RBC: 3.4 MIL/uL — AB (ref 4.22–5.81)
RDW: 14.2 % (ref 11.5–15.5)
WBC: 12.8 10*3/uL — AB (ref 4.0–10.5)

## 2014-12-30 LAB — PROTIME-INR
INR: 1.38 (ref 0.00–1.49)
Prothrombin Time: 17.1 seconds — ABNORMAL HIGH (ref 11.6–15.2)

## 2014-12-30 LAB — HEPARIN LEVEL (UNFRACTIONATED): HEPARIN UNFRACTIONATED: 0.59 [IU]/mL (ref 0.30–0.70)

## 2014-12-30 MED ORDER — OXYMETAZOLINE HCL 0.05 % NA SOLN
1.0000 | Freq: Two times a day (BID) | NASAL | Status: DC | PRN
  Administered 2014-12-30: 1 via NASAL
  Filled 2014-12-30: qty 15

## 2014-12-30 MED ORDER — WARFARIN SODIUM 7.5 MG PO TABS
7.5000 mg | ORAL_TABLET | Freq: Once | ORAL | Status: AC
  Administered 2014-12-30: 7.5 mg via ORAL
  Filled 2014-12-30: qty 1

## 2014-12-30 MED ORDER — KETOTIFEN FUMARATE 0.025 % OP SOLN
1.0000 [drp] | Freq: Two times a day (BID) | OPHTHALMIC | Status: DC
  Administered 2014-12-31 (×2): 1 [drp] via OPHTHALMIC
  Filled 2014-12-30: qty 5

## 2014-12-30 MED ORDER — SALINE SPRAY 0.65 % NA SOLN
1.0000 | NASAL | Status: DC | PRN
  Filled 2014-12-30: qty 44

## 2014-12-30 NOTE — Progress Notes (Signed)
PATIENT DETAILS Name: [name] [name] Age: 73 y.o. Sex: male Date of Birth: 1941/06/09 Admit Date: 12/25/2014 Admitting Physician Jani Gravel, MD [id number], Elenore Rota, MD  Brief narrative  Patient is a 74 year old male with history of coronary artery disease status post CABG, and also status post PCI in October 2015, atrial fibrillation on chronic Coumadin who was admitted for evaluation of persistent right upper quadrant abdominal pain thought to be secondary to acute calculus cholecystitis. Patient was admitted, kept nothing by mouth and Coumadin was reversed with FFP/vitamin K. After CCS evaluation, patient was planned for a laparoscopic cholecystectomy pending cardiac clearance. Once cardiac clearance was obtained, upon further recommendations by general surgery, it was decided that since symptoms were ongoing for several days, pursuing a percutaneous drainage was thought to be a better option as it would minimize postoperative complications. Subsequently patient underwent a percutaneous drainage by interventional radiology. Currently diet has been advanced and patient is tolerating diet well. Coumadin has been resumed with INR still being subtherapeutic.  Subjective: Developed epistaxis 2 this morning. During my evaluation this was slowing down. Dried blood in neck and right face.  Assessment/Plan: Active Problems:   Acute Cholecystitis:Admitted, kept NPO, started on empiric Zosyn and other supportive measures.Seen by cards, pre-operatively and was cleared to proceed with surgery. Patient was on coumadin and had to reverse it with Vitamin K in order to proceed with surgery. After further evaluation by Gen Surgery, it was felt that patient would benefit from placement of a percutaneous drainage rather than cholecystectomy. Subsequently underwent a cutaneous drainage placement on 3/26, drain in place and draining well. Diet gradually has been advanced, no RUQ pain today. Will continue with  Zosyn (day 5) for now, and plan on a total of 2 weeks of antibiotics on discharge. Will need follow-up at [facility] surgery.    Sepsis:secondary to above. Sepsis pathophysiology has resolved.    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 Continue ASA and statin    Atrial Fibrillation:coumadin reversed in anticipation of cholecystectomy/drain placement. Patient was started on overlapping heparin infusion and Coumadin on 3/27, on 3/28, patient developed epistaxis 2 which seems to be resolving at the time of this dictation with just supportive measures.Will stop heparin infusion, and just continue Coumadin. We will watch overnight, if no further epistaxis, suspect can be discharged home on 3/29.     Mild epistaxis: See above.    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 Monitor lytes periodically.    Hx of Chronic Systolic CHF:EF has now normalized-last EF per echo on 10/14/14 was 55%. Remains clinically compensated. Continue Bidil, lasix remains on hold for now. Will resume once diet has been fully advanced.    Hypothyroidism:continue Levothyroxine    [id number] Bidil and hydralazine    COPD:Stable, lungs clear. Continue nebs.   Disposition: Remain inpatient but transfer to telemetry-if no further epistaxis, discharge home on 3/29  Antibiotics:  See below   Anti-infectives    Start     Dose/Rate Route Frequency Ordered Stop   12/28/14 1530  levofloxacin (LEVAQUIN) IVPB 500 mg     500 mg 100 mL/hr over 60 Minutes Intravenous  Once 12/28/14 1459     12/26/14 1800  piperacillin-tazobactam (ZOSYN) IVPB 3.375 g     3.375 g 12.5 mL/hr over 240 Minutes Intravenous Every 8 hours 12/26/14 1734        DVT Prophylaxis:  Coumadin  Code Status: Full code   Family Communication None at bedsde this a.m.  Procedures:  None  CONSULTS:  general surgery and IR  MEDICATIONS: Scheduled  Meds: . aspirin EC  81 mg Oral Q M,W,F  . budesonide  0.25 mg Nebulization BID  . cholecalciferol  5,000 Units Oral Daily  . docusate sodium  100 mg Oral BID  . ferrous sulfate  325 mg Oral BID WC  . hydrALAZINE  25 mg Oral TID  . isosorbide-hydrALAZINE  1 tablet Oral TID  . levofloxacin (LEVAQUIN) IV  500 mg Intravenous Once  . levothyroxine  100 mcg Oral QAC breakfast  . ondansetron (ZOFRAN) IV  8 mg Intravenous Once  . piperacillin-tazobactam (ZOSYN)  IV  3.375 g Intravenous Q8H  . polyethylene glycol  17 g Oral Daily  . potassium chloride  10 mEq Oral Daily  . pravastatin  40 mg Oral q1800  . sodium chloride  3 mL Intravenous Q12H  . warfarin  7.5 mg Oral ONCE-1800  . Warfarin - Pharmacist Dosing Inpatient   Does not apply q1800   Continuous Infusions: . sodium chloride 10 mL/hr at 12/30/14 0600   PRN Meds:.acetaminophen, HYDROmorphone (DILAUDID) injection, ipratropium-albuterol, nitroGLYCERIN, ondansetron (ZOFRAN) IV, oxymetazoline, sodium chloride    PHYSICAL EXAM: Vital signs in last 24 hours: Filed Vitals:   12/29/14 2310 12/30/14 0315 12/30/14 0731 12/30/14 0800  BP: 133/51 136/50  151/46  Pulse: 84 77    Temp: 98.7 F (37.1 C) 99.1 F (37.3 C)  100.2 F (37.9 C)  TempSrc: Oral Oral  Oral  Resp: 16 13    Height:      Weight:      SpO2: 93% 92% 94%     Weight change:  Filed Weights   12/27/14 0551 12/28/14 0537 12/29/14 0329  Weight: 93 kg (205 lb 0.4 oz) 92 kg (202 lb 13.2 oz) 95.2 kg (209 lb 14.1 oz)   Body mass index is 29.68 kg/(m^2).   Gen Exam: Awake and alert with clear speech.   Neck: Supple, No JVD.   Chest: B/L Clear.  No rales or rhonchi CVS: S1 S2 Regular, no murmurs.  Abdomen: soft, BS +, mildly tender at RUQ  drain site, non distended.  Extremities: no edema, lower extremities warm to touch. Neurologic: Non Focal.   Skin: No Rash.   Wounds: N/A.   Intake/Output from previous day:  Intake/Output Summary (Last 24 hours) at 12/30/14  1114 Last data filed at 12/30/14 0600  Gross per 24 hour  Intake 706.13 ml  Output   1445 ml  Net -738.87 ml     LAB RESULTS: CBC  Recent Labs Lab 12/25/14 1502 12/26/14 0707 12/27/14 0741 12/28/14 0508 12/29/14 0245 12/30/14 0049  WBC 11.7* 10.3 9.8 10.3 10.3 12.8*  HGB 12.1* 10.9* 10.6* 10.7* 10.1* 10.5*  HCT 37.1* 33.5* 32.9* 33.5* 31.9* 32.8*  PLT 401* 331 315 319 278 272  MCV 94.2 95.4 95.4 95.7 96.7 96.5  MCH 30.7 31.1 30.7 30.6 30.6 30.9  MCHC 32.6 32.5 32.2 31.9 31.7 32.0  RDW 14.7 14.8 14.5 14.6 14.5 14.2  LYMPHSABS 0.9 1.0  --   --   --   --   MONOABS 1.5* 1.6*  --   --   --   --   EOSABS 0.5 0.8*  --   --   --   --   BASOSABS 0.1 0.1  --   --   --   --  Chemistries   Recent Labs Lab 12/25/14 1502 12/26/14 0707 12/27/14 0741 12/28/14 0508 12/29/14 0245  NA 138 137 139 139 139  K 5.0 4.4 4.7 4.2 4.5  CL 101 104 102 99 105  CO2 27 24 27  35* 27  GLUCOSE 105* 92 92 92 74  BUN 43* 41* 30* 24* 21  CREATININE 2.19* 1.94* 1.76* 1.82* 1.79*  CALCIUM 9.2 8.6 9.0 8.8 8.4    CBG: No results for input(s): GLUCAP in the last 168 hours.  GFR Estimated Creatinine Clearance: 41.7 mL/min (by C-G formula based on Cr of 1.79).  Coagulation profile  Recent Labs Lab 12/26/14 1828 12/27/14 0741 12/27/14 1958 12/28/14 0508 12/30/14 0049  INR 2.18* 1.80* 1.50* 1.45 1.38    Cardiac Enzymes  Recent Labs Lab 12/25/14 2031  TROPONINI 0.04*    Invalid input(s): POCBNP No results for input(s): DDIMER in the last 72 hours. No results for input(s): HGBA1C in the last 72 hours. No results for input(s): CHOL, HDL, LDLCALC, TRIG, CHOLHDL, LDLDIRECT in the last 72 hours. No results for input(s): TSH, T4TOTAL, T3FREE, THYROIDAB in the last 72 hours.  Invalid input(s): FREET3 No results for input(s): VITAMINB12, FOLATE, FERRITIN, TIBC, IRON, RETICCTPCT in the last 72 hours. No results for input(s): LIPASE, AMYLASE in the last 72 hours.  Urine Studies No  results for input(s): UHGB, CRYS in the last 72 hours.  Invalid input(s): UACOL, UAPR, USPG, UPH, UTP, UGL, UKET, UBIL, UNIT, UROB, ULEU, UEPI, UWBC, URBC, UBAC, CAST, UCOM, BILUA  MICROBIOLOGY: Recent Results (from the past 240 hour(s))  Culture, blood (routine x 2)     Status: None (Preliminary result)   Collection Time: 12/26/14  6:30 PM  Result Value Ref Range Status   Specimen Description BLOOD LEFT ARM  Final   Special Requests BOTTLES DRAWN AEROBIC AND ANAEROBIC 10CC  Final   Culture   Final           BLOOD CULTURE RECEIVED NO GROWTH TO DATE CULTURE WILL BE HELD FOR 5 DAYS BEFORE ISSUING A FINAL NEGATIVE REPORT Note: Culture results may be compromised due to an excessive volume of blood received in culture bottles. Performed at [facility]    Report Status PENDING  Incomplete  Culture, blood (routine x 2)     Status: None (Preliminary result)   Collection Time: 12/26/14  6:40 PM  Result Value Ref Range Status   Specimen Description BLOOD LEFT HAND  Final   Special Requests BOTTLES DRAWN AEROBIC ONLY 6CC  Final   Culture   Final           BLOOD CULTURE RECEIVED NO GROWTH TO DATE CULTURE WILL BE HELD FOR 5 DAYS BEFORE ISSUING A FINAL NEGATIVE REPORT Performed at [facility]    Report Status PENDING  Incomplete  Culture, routine-abscess     Status: None (Preliminary result)   Collection Time: 12/28/14  2:41 PM  Result Value Ref Range Status   Specimen Description GALL BLADDER  Final   Special Requests NONE  Final   Gram Stain   Final    NO WBC SEEN NO SQUAMOUS EPITHELIAL CELLS SEEN NO ORGANISMS SEEN Performed at [facility]    Culture   Final    NO GROWTH 1 DAY Performed at [facility]    Report Status PENDING  Incomplete    RADIOLOGY STUDIES/RESULTS: Dg Chest 2 View  12/26/2014   CLINICAL DATA:  Acute onset of nausea. Preoperative chest radiograph for gallbladder surgery. Initial  encounter.  EXAM: CHEST  2 VIEW  COMPARISON:   Chest radiograph performed 11/28/2014  FINDINGS: The lungs are well-aerated. Minimal bibasilar atelectasis is noted. There is no evidence of pleural effusion or pneumothorax.  The heart is borderline enlarged. The patient is status post median sternotomy, with evidence of prior CABG. No acute osseous abnormalities are seen.  IMPRESSION: Minimal bibasilar atelectasis noted.  Borderline cardiomegaly.   Electronically Signed   By: Garald Balding M.D.   On: 12/26/2014 01:46   Ir Perc Cholecystostomy  12/28/2014   INDICATION: 74 year old male with a history of acute cholecystitis. The patient has been assessed as a high surgical risk for complications. He has been referred by surgery for percutaneous cholecystostomy tube.  EXAM: ULTRASOUND AND FLUOROSCOPIC-GUIDED CHOLECYSTOSTOMY TUBE PLACEMENT  COMPARISON:  Ultrasound 12/25/2014, CT 11/26/2014  MEDICATIONS: Fentanyl 125 mcg IV; Versed 3.0 mg IV;  Levaquin 500 mg IV administered.  25 mg Demerol.  ANESTHESIA/SEDATION: Total Moderate Sedation Time  Forty-five minutes  CONTRAST:  25mL OMNIPAQUE IOHEXOL 300 MG/ML  SOLN  FLUOROSCOPY TIME:  2 minutes 6 seconds  COMPLICATIONS: SIR category 2  PROCEDURE: Informed written consent was obtained from the patient after a discussion of the risks, benefits and alternatives to treatment. Questions regarding the procedure were encouraged and answered. A timeout was performed prior to the initiation of the procedure.  The right upper abdominal quadrant was prepped and draped in the usual sterile fashion, and a sterile drape was applied covering the operative field. Maximum barrier sterile technique with sterile gowns and gloves were used for the procedure. A timeout was performed prior to the initiation of the procedure. Local anesthesia was provided with 1% lidocaine with epinephrine.  Ultrasound scanning of the right upper quadrant demonstrates a markedly dilated gallbladder. Of note, the patient reported pain with ultrasound imaging  over the gallbladder. Utilizing a transhepatic approach, a 22 gauge needle was advanced into the gallbladder under direct ultrasound guidance. An ultrasound image was saved for documentation purposes. Appropriate intraluminal puncture was confirmed with the efflux of bile and advancement of an 0.018 wire into the gallbladder lumen. The needle was exchanged for an [place] set. A small amount of contrast was injected to confirm appropriate intraluminal positioning. Over a Benson wire, a 52.2-French [place] cholecystomy tube was advanced into the gallbladder fossa, coiled and locked. Bile was aspirated and a small amount of contrast was injected as several post procedural spot radiographic images were obtained in various obliquities. The catheter was secured to the skin with suture, connected to a drainage bag and a dressing was placed.  During the drain placement, the patient became hypotensive, complained of feeling warm, and experienced nausea with right upper quadrant pain.  No significant blood loss.  IMPRESSION: Status post placement of percutaneous cholecystostomy tube. A sample was sent to the lab for analysis.  Signed,  Dulcy Fanny. Earleen Newport, DO  [facility]  [facility]  PLAN: There is a concern for bacteremia/sepsis after the procedure, given that the patient became hypotensive, nauseated, and complained of feeling warm/febrile.  Levaquin 500 mg IV antibiotics will cover bacteremia.  Fluid bolus of 500 cc was ordered as well as 150 cc/hour of normal saline.  Demerol 25 mg.  Toradol 30 mg IV for 3 doses was ordered.  Zofran 8 mg IV.  The primary medical team was contacted for this result, and potential elevation to a higher level of care was discussed.   Electronically Signed   By: Corrie Mckusick D.O.  On: 12/28/2014 15:28   US Abdomen Limited Ruq  12/25/2014   CLINICAL DATA:  Right upper quadrant pain  EXAM: US ABDOMEN LIMITED - RIGHT UPPER QUADRANT  COMPARISON:   11/26/14  FINDINGS: Gallbladder:  Again noted tumefactive sludge within gallbladder. There is progression of gallbladder wall thickening up to 9.8 mm. Findings are suspicious for acute cholecystitis and clinical correlation is necessary. There is positive sonographic Murphy's sign.  Common bile duct:  Diameter: 5.7 mm in diameter within normal limits.  Liver:  No focal lesion identified. Within normal limits in parenchymal echogenicity.  IMPRESSION: Again noted tumefactive sludge within gallbladder. There is progression of gallbladder wall thickening up to 9.8 mm. Findings are suspicious for acute cholecystitis and clinical correlation is necessary. There is positive sonographic Murphy's sign.   Electronically Signed   By: Lahoma Crocker M.D.   On: 12/25/2014 18:28    Oren Binet, MD  [facility] Pager:[phone]  If 7PM-7AM, please contact night-coverage [email or web address] Password [email or web address] 12/30/2014, 11:14 AM   LOS: 5 days

## 2014-12-30 NOTE — Progress Notes (Signed)
Medicare Important Message given? YES (If response is "NO", the following Medicare IM given date fields will be blank) Date Medicare IM given:12/30/2014 Medicare IM given by: Whitman Hero

## 2014-12-30 NOTE — Progress Notes (Signed)
Patient arrived to floor. Alert and oriented. No complaints voiced at this time. Dressing to right flank clean dry and intact. Vital signs stable. SCD hose in place. Telemetry applied. Family at bedside.

## 2014-12-30 NOTE — Progress Notes (Signed)
ANTICOAGULATION CONSULT NOTE - Follow Up Consult  Pharmacy Consult for heparin Indication: atrial fibrillation   Labs:  Recent Labs  12/27/14 0741 12/27/14 1958 12/28/14 0508 12/29/14 0245 12/29/14 1602 12/30/14 0049  HGB 10.6*  --  10.7* 10.1*  --  10.5*  HCT 32.9*  --  33.5* 31.9*  --  32.8*  PLT 315  --  319 278  --  272  LABPROT 21.0* 18.3* 17.8*  --   --  17.1*  INR 1.80* 1.50* 1.45  --   --  1.38  HEPARINUNFRC  --   --   --   --  0.23* 0.59  CREATININE 1.76*  --  1.82* 1.79*  --   --      Assessment/Plan:  74yo male therapeutic on heparin after rate change. Will continue gtt at current rate and confirm stable with additional level.   Wynona Neat, PharmD, BCPS  12/30/2014,1:40 AM

## 2014-12-30 NOTE — Progress Notes (Signed)
Patient ID: [name] [name], male   DOB: 08-04-1941, 74 y.o.   MRN: [id number]    Subjective: Pt feels well today.  No complaints except for epistaxis this am.  No abdominal pain.  Tolerating a regular diet  Objective: Vital signs in last 24 hours: Temp:  [97.9 F (36.6 C)-100.2 F (37.9 C)] 100.2 F (37.9 C) (03/28 0800) Pulse Rate:  [76-84] 77 (03/28 0315) Resp:  [13-16] 13 (03/28 0315) BP: (103-151)/(46-60) 151/46 mmHg (03/28 0800) SpO2:  [92 %-94 %] 94 % (03/28 0731) Last BM Date: 12/23/14  Intake/Output from previous day: 03/27 0701 - 03/28 0700 In: 716.1 [P.O.:100; I.V.:516.1; IV Piggyback:100] Out: [id number] [Urine:1350; Drains:105] Intake/Output this shift:    PE: Abd: soft, minimally tender in RUQ to deep palpation.  +BS, Nd, perc chole drain with bilious output Heart: irregular Lungs: CTAB anterior  Lab Results:   Recent Labs  12/29/14 0245 12/30/14 0049  WBC 10.3 12.8*  HGB 10.1* 10.5*  HCT 31.9* 32.8*  PLT 278 272   BMET  Recent Labs  12/28/14 0508 12/29/14 0245  NA 139 139  K 4.2 4.5  CL 99 105  CO2 35* 27  GLUCOSE 92 74  BUN 24* 21  CREATININE 1.82* 1.79*  CALCIUM 8.8 8.4   PT/INR  Recent Labs  12/28/14 0508 12/30/14 0049  LABPROT 17.8* 17.1*  INR 1.45 1.38   CMP     Component Value Date/Time   NA 139 12/29/2014 0245   NA 139 12/23/2014 1124   K 4.5 12/29/2014 0245   CL 105 12/29/2014 0245   CO2 27 12/29/2014 0245   GLUCOSE 74 12/29/2014 0245   GLUCOSE 96 12/23/2014 1124   BUN 21 12/29/2014 0245   BUN 34* 12/23/2014 1124   CREATININE 1.79* 12/29/2014 0245   CALCIUM 8.4 12/29/2014 0245   PROT 6.1 12/29/2014 0245   PROT 6.5 12/23/2014 1124   ALBUMIN 3.0* 12/29/2014 0245   AST 23 12/29/2014 0245   ALT 24 12/29/2014 0245   ALKPHOS 54 12/29/2014 0245   BILITOT 0.6 12/29/2014 0245   BILITOT 0.2 12/23/2014 1124   GFRNONAA 35* 12/29/2014 0245   GFRAA 41* 12/29/2014 0245   Lipase     Component Value Date/Time   LIPASE 27  12/27/2014 0741       Studies/Results: Ir Perc Cholecystostomy  12/28/2014   INDICATION: 74 year old male with a history of acute cholecystitis. The patient has been assessed as a high surgical risk for complications. He has been referred by surgery for percutaneous cholecystostomy tube.  EXAM: ULTRASOUND AND FLUOROSCOPIC-GUIDED CHOLECYSTOSTOMY TUBE PLACEMENT  COMPARISON:  Ultrasound 12/25/2014, CT 11/26/2014  MEDICATIONS: Fentanyl 125 mcg IV; Versed 3.0 mg IV;  Levaquin 500 mg IV administered.  25 mg Demerol.  ANESTHESIA/SEDATION: Total Moderate Sedation Time  Forty-five minutes  CONTRAST:  96mL OMNIPAQUE IOHEXOL 300 MG/ML  SOLN  FLUOROSCOPY TIME:  2 minutes 6 seconds  COMPLICATIONS: SIR category 2  PROCEDURE: Informed written consent was obtained from the patient after a discussion of the risks, benefits and alternatives to treatment. Questions regarding the procedure were encouraged and answered. A timeout was performed prior to the initiation of the procedure.  The right upper abdominal quadrant was prepped and draped in the usual sterile fashion, and a sterile drape was applied covering the operative field. Maximum barrier sterile technique with sterile gowns and gloves were used for the procedure. A timeout was performed prior to the initiation of the procedure. Local anesthesia was provided with 1% lidocaine with epinephrine.  Ultrasound scanning of the right upper quadrant demonstrates a markedly dilated gallbladder. Of note, the patient reported pain with ultrasound imaging over the gallbladder. Utilizing a transhepatic approach, a 22 gauge needle was advanced into the gallbladder under direct ultrasound guidance. An ultrasound image was saved for documentation purposes. Appropriate intraluminal puncture was confirmed with the efflux of bile and advancement of an 0.018 wire into the gallbladder lumen. The needle was exchanged for an Pierson set. A small amount of contrast was injected to confirm  appropriate intraluminal positioning. Over a Benson wire, a 59.2-French Cook cholecystomy tube was advanced into the gallbladder fossa, coiled and locked. Bile was aspirated and a small amount of contrast was injected as several post procedural spot radiographic images were obtained in various obliquities. The catheter was secured to the skin with suture, connected to a drainage bag and a dressing was placed.  During the drain placement, the patient became hypotensive, complained of feeling warm, and experienced nausea with right upper quadrant pain.  No significant blood loss.  IMPRESSION: Status post placement of percutaneous cholecystostomy tube. A sample was sent to the lab for analysis.  Signed,  Dulcy Fanny. Earleen Newport, DO  [facility]  [facility]  PLAN: There is a concern for bacteremia/sepsis after the procedure, given that the patient became hypotensive, nauseated, and complained of feeling warm/febrile.  Levaquin 500 mg IV antibiotics will cover bacteremia.  Fluid bolus of 500 cc was ordered as well as 150 cc/hour of normal saline.  Demerol 25 mg.  Toradol 30 mg IV for 3 doses was ordered.  Zofran 8 mg IV.  The primary medical team was contacted for this result, and potential elevation to a higher level of care was discussed.   Electronically Signed   By: Corrie Mckusick D.O.   On: 12/28/2014 15:28    Anti-infectives: Anti-infectives    Start     Dose/Rate Route Frequency Ordered Stop   12/28/14 1530  levofloxacin (LEVAQUIN) IVPB 500 mg     500 mg 100 mL/hr over 60 Minutes Intravenous  Once 12/28/14 1459     12/26/14 1800  piperacillin-tazobactam (ZOSYN) IVPB 3.375 g     3.375 g 12.5 mL/hr over 240 Minutes Intravenous Every 8 hours 12/26/14 1734         Assessment/Plan  1. Acute cholecystitis -s/p perc chole drain -tolerating a regular diet -will need a total of 7 days of abx therapy -follow up in the office in about 3-4 weeks to discuss timing of  elective surgery -stable from our standpoint for DC home when medically stable.    LOS: 5 days    Lurlene Ronda E 12/30/2014, 9:08 AM Pager: [phone]

## 2014-12-30 NOTE — Progress Notes (Signed)
Pt to [place] to [id number]-13, called report. Family present & aware.

## 2014-12-30 NOTE — Evaluation (Signed)
Physical Therapy Evaluation Patient Details Name: AVITAJ SCHOLLER MRN: [id number] DOB: 1941/08/17 Today's Date: 12/30/2014   History of Present Illness  pt presents with Cholecystitis with drain placement and Sepsis.    Clinical Impression  Pt moving well despite feeling weak and fatiguing easily.  Pt ed on increasing activity with Nsg supervision by walking to bathroom, up to chair, and ambulating with staff throughout the day.  Feel pt will continue to make great progress to return to home with wife.  Will continue to follow.      Follow Up Recommendations Home health PT;Supervision - Intermittent    Equipment Recommendations  None recommended by PT    Recommendations for Other Services       Precautions / Restrictions Precautions Precautions: Fall Precaution Comments: R sided drain Restrictions Weight Bearing Restrictions: No      Mobility  Bed Mobility Overal bed mobility: Modified Independent             General bed mobility comments: pt needs icnreased time, but able to complete in/out of bed without A.    Transfers Overall transfer level: Needs assistance Equipment used: None Transfers: Sit to/from Stand Sit to Stand: Supervision         General transfer comment: pt mildly unsteady with coming to stand and indicates LEs feel weak, but no LOB noted.    Ambulation/Gait Ambulation/Gait assistance: Min guard Ambulation Distance (Feet): 80 Feet Assistive device: 1 person hand held assist Gait Pattern/deviations: Step-through pattern;Decreased stride length     General Gait Details: pt moves slowly and cautiously, indicating he feels weak, but no LOB or gait deficits noted.    Stairs            Wheelchair Mobility    Modified Rankin (Stroke Patients Only)       Balance Overall balance assessment: Needs assistance Sitting-balance support: No upper extremity supported;Feet supported Sitting balance-Leahy Scale: Good     Standing balance  support: No upper extremity supported;During functional activity Standing balance-Leahy Scale: Fair Standing balance comment: Difficulty with balance challenges.                               Pertinent Vitals/Pain Pain Assessment: 0-10 Pain Score: 3  Pain Location: R side Pain Descriptors / Indicators: Sore Pain Intervention(s): Monitored during session;Premedicated before session;Repositioned    Home Living Family/patient expects to be discharged to:: Private residence Living Arrangements: Spouse/significant other Available Help at Discharge: Family;Available 24 hours/day Type of Home: House Home Access: Stairs to enter Entrance Stairs-Rails: Left Entrance Stairs-Number of Steps: 3 Home Layout: One level Home Equipment: None      Prior Function Level of Independence: Independent               Hand Dominance        Extremity/Trunk Assessment   Upper Extremity Assessment: Overall WFL for tasks assessed           Lower Extremity Assessment: Overall WFL for tasks assessed      Cervical / Trunk Assessment: Normal  Communication   Communication: No difficulties  Cognition Arousal/Alertness: Awake/alert Behavior During Therapy: WFL for tasks assessed/performed Overall Cognitive Status: Within Functional Limits for tasks assessed                      General Comments      Exercises        Assessment/Plan    PT Assessment  Patient needs continued PT services  PT Diagnosis Difficulty walking   PT Problem List Decreased activity tolerance;Decreased balance;Decreased mobility;Pain  PT Treatment Interventions DME instruction;Gait training;Stair training;Functional mobility training;Therapeutic activities;Therapeutic exercise;Balance training;Patient/family education   PT Goals (Current goals can be found in the Care Plan section) Acute Rehab PT Goals Patient Stated Goal: Home soon as I can.   PT Goal Formulation: With patient Time For  Goal Achievement: 01/06/15 Potential to Achieve Goals: Good    Frequency Min 3X/week   Barriers to discharge        Co-evaluation               End of Session   Activity Tolerance: Patient limited by fatigue Patient left: in bed;with call bell/phone within reach;with family/visitor present Nurse Communication: Mobility status         Time: [id number] PT Time Calculation (min) (ACUTE ONLY): 17 min   Charges:   PT Evaluation $Initial PT Evaluation Tier I: 1 Procedure     PT G CodesCatarina Hartshorn, [place] 12/30/2014, 11:47 AM

## 2014-12-30 NOTE — Progress Notes (Signed)
ANTICOAGULATION CONSULT NOTE - Follow Up Consult  Pharmacy Consult for Coumadin Indication: atrial fibrillation  No Known Allergies  Patient Measurements: Height: 5' 10.5" (179.1 cm) Weight: 209 lb 14.1 oz (95.2 kg) IBW/kg (Calculated) : 74.15  Vital Signs: Temp: 100.2 F (37.9 C) (03/28 0800) Temp Source: Oral (03/28 0800) BP: 151/46 mmHg (03/28 0800) Pulse Rate: 77 (03/28 0315)  Labs:  Recent Labs  12/27/14 1958  12/28/14 0508 12/29/14 0245 12/29/14 1602 12/30/14 0049  HGB  --   < > 10.7* 10.1*  --  10.5*  HCT  --   --  33.5* 31.9*  --  32.8*  PLT  --   --  319 278  --  272  LABPROT 18.3*  --  17.8*  --   --  17.1*  INR 1.50*  --  1.45  --   --  1.38  HEPARINUNFRC  --   --   --   --  0.23* 0.59  CREATININE  --   --  1.82* 1.79*  --   --   < > = values in this interval not displayed.  Estimated Creatinine Clearance: 41.7 mL/min (by C-G formula based on Cr of 1.79).  Assessment: 73yom on coumadin pta for afib, admitted with acute cholecystitis. Coumadin held and INR reversed (5mg  vitamin k on 3/25) pending percutaneous cholecystostomy drain on 3/26. On 3/27, coumadin was resumed with a heparin bridge, however, heparin has been stopped today due to nosebleeds. Only coumadin to continue. INR below goal as expected at 1.38.  Home dose: 5mg  daily except 2.5mg  on Thursday and Sunday  Goal of Therapy:  INR 2-3 Monitor platelets by anticoagulation protocol: Yes   Plan:  1) Repeat coumadin 7.5mg  x 1 2) Daily INR  Deboraha Sprang 12/30/2014,8:57 AM

## 2014-12-31 DIAGNOSIS — K819 Cholecystitis, unspecified: Secondary | ICD-10-CM

## 2014-12-31 LAB — CBC
HCT: 30.7 % — ABNORMAL LOW (ref 39.0–52.0)
Hemoglobin: 10 g/dL — ABNORMAL LOW (ref 13.0–17.0)
MCH: 31.2 pg (ref 26.0–34.0)
MCHC: 32.6 g/dL (ref 30.0–36.0)
MCV: 95.6 fL (ref 78.0–100.0)
Platelets: 250 10*3/uL (ref 150–400)
RBC: 3.21 MIL/uL — ABNORMAL LOW (ref 4.22–5.81)
RDW: 14.5 % (ref 11.5–15.5)
WBC: 9.6 10*3/uL (ref 4.0–10.5)

## 2014-12-31 LAB — PROTIME-INR
INR: 1.61 — ABNORMAL HIGH (ref 0.00–1.49)
Prothrombin Time: 19.2 seconds — ABNORMAL HIGH (ref 11.6–15.2)

## 2014-12-31 MED ORDER — LEVOFLOXACIN 250 MG PO TABS: 250.0000 mg | ORAL_TABLET | Freq: Every day | ORAL | Status: DC

## 2014-12-31 MED ORDER — WARFARIN SODIUM 5 MG PO TABS
5.0000 mg | ORAL_TABLET | Freq: Once | ORAL | Status: DC
  Filled 2014-12-31: qty 1

## 2014-12-31 MED ORDER — HYDROCODONE-ACETAMINOPHEN 5-325 MG PO TABS: 1.0000 | ORAL_TABLET | Freq: Four times a day (QID) | ORAL | Status: DC | PRN

## 2014-12-31 MED ORDER — DOCUSATE SODIUM 100 MG PO CAPS: 100.0000 mg | ORAL_CAPSULE | Freq: Two times a day (BID) | ORAL | Status: DC

## 2014-12-31 MED ORDER — FUROSEMIDE 40 MG PO TABS: 40.0000 mg | ORAL_TABLET | Freq: Every day | ORAL | Status: DC

## 2014-12-31 MED ORDER — OXYMETAZOLINE HCL 0.05 % NA SOLN: 1.0000 | Freq: Two times a day (BID) | NASAL | Status: DC | PRN

## 2014-12-31 MED ORDER — LEVOFLOXACIN 750 MG PO TABS: 750.0000 mg | ORAL_TABLET | ORAL | Status: DC

## 2014-12-31 MED ORDER — LEVOFLOXACIN 500 MG PO TABS
500.0000 mg | ORAL_TABLET | Freq: Once | ORAL | Status: DC
  Filled 2014-12-31: qty 1

## 2014-12-31 MED ORDER — LEVOFLOXACIN 750 MG PO TABS
750.0000 mg | ORAL_TABLET | ORAL | Status: DC
  Administered 2014-12-31: 750 mg via ORAL
  Filled 2014-12-31: qty 1

## 2014-12-31 MED ORDER — SALINE SPRAY 0.65 % NA SOLN: 1.0000 | NASAL | Status: DC | PRN

## 2014-12-31 NOTE — Progress Notes (Signed)
Physical Therapy Treatment Patient Details Name: [name] [name] MRN: [id number] DOB: Nov 30, 1940 Today's Date: 12/31/2014    History of Present Illness pt presents with Cholecystitis with drain placement and Sepsis.      PT Comments    Motivated to get well, and willing to walk; LLE soreness and stiffness today making walking less steady and more difficult -- but he used a RW well, and reports he can use his wife's RW at home; Stair training complete; OK for dc home from PT standpoint   Follow Up Recommendations  Home health PT;Supervision - Intermittent     Equipment Recommendations  Rolling walker with 5" wheels (pt is checking- he may already have one, or a cane, which is appropriate, too)    Recommendations for Other Services       Precautions / Restrictions Precautions Precautions: Fall Precaution Comments: R sided drain    Mobility  Bed Mobility                  Transfers Overall transfer level: Needs assistance Equipment used: None Transfers: Sit to/from Stand Sit to Stand: Min guard         General transfer comment: pt mildly unsteady with coming to stand and indicates LEs feel weak, but no LOB noted.  Closer guard today because of L knee stiffness (pt attributes to arthritis  Ambulation/Gait Ambulation/Gait assistance: Min guard;Supervision Ambulation Distance (Feet): 150 Feet Assistive device: 1 person hand held assist;Rolling walker (2 wheeled) Gait Pattern/deviations: Step-through pattern;Decreased stride length     General Gait Details: walked initially without Assistive device, but pt moving quite slowly, and grimacing in pain with L stance; then walked with RW to unweigh painful LLE; much smoother gait with bil UE support   Stairs Stairs: Yes Stairs assistance: Supervision Stair Management: One rail Left;Sideways;Step to pattern Number of Stairs: 3 General stair comments: verbal and demo cues for sequence  Wheelchair Mobility     Modified Rankin (Stroke Patients Only)       Balance             Standing balance-Leahy Scale: Fair Standing balance comment: Painful LLE without UE support                    Cognition Arousal/Alertness: Awake/alert Behavior During Therapy: WFL for tasks assessed/performed Overall Cognitive Status: Within Functional Limits for tasks assessed                      Exercises      General Comments  Session conducted on Room Air and sats 94% post amb; pt with noted DOE  2/4 and he is requesting a Respiratory treatment; notified RN      Pertinent Vitals/Pain Pain Assessment: 0-10 Pain Score: 2  Pain Location: L knee "arthritis" per pt; R side drain site "pulling" Pain Descriptors / Indicators: Sore (stiffness L knee; "pulling" at drain site) Pain Intervention(s): Monitored during session    Home Living                      Prior Function            PT Goals (current goals can now be found in the care plan section) Acute Rehab PT Goals Patient Stated Goal: Home soon as I can.   PT Goal Formulation: With patient Time For Goal Achievement: 01/06/15 Potential to Achieve Goals: Good Progress towards PT goals: Progressing toward goals    Frequency  Min  3X/week    PT Plan Current plan remains appropriate;Equipment recommendations need to be updated    Co-evaluation             End of Session   Activity Tolerance: Patient tolerated treatment well Patient left: in chair;with call bell/phone within reach     Time: 0915-0934 PT Time Calculation (min) (ACUTE ONLY): 19 min  Charges:  $Gait Training: 8-22 mins                    G Codes:      [name] 12/31/2014, 9:49 AM  [name], [name] Pager [phone] Office [phone]

## 2014-12-31 NOTE — Progress Notes (Signed)
IV's removed. Discharge instructions reviewed with patient. Understanding verbalized. Awaiting ride for discharge home.

## 2014-12-31 NOTE — Progress Notes (Signed)
Patient ID: [name] [name], male   DOB: [birth year], 74 y.o.   MRN: [id number]    Subjective: Pt feels great today.  Ready to go home.  Tolerating a solid diet.  No further epistaxis  Objective: Vital signs in last 24 hours: Temp:  [98.5 F (36.9 C)-99.1 F (37.3 C)] 99.1 F (37.3 C) (03/29 0540) Pulse Rate:  [76-84] 83 (03/29 0540) Resp:  [16-17] 16 (03/29 0540) BP: (133-155)/(46-55) 133/55 mmHg (03/29 0540) SpO2:  [93 %-97 %] 97 % (03/29 0540) Weight:  [93.895 kg (207 lb)-94.7 kg (208 lb 12.4 oz)] 94.7 kg (208 lb 12.4 oz) (03/28 2026) Last BM Date: 12/23/14  Intake/Output from previous day: 03/28 0701 - 03/29 0700 In: 1310.5 [P.O.:940; I.V.:220.5; IV Piggyback:150] Out: 1075 [Urine:925; Drains:150] Intake/Output this shift: Total I/O In: 50 [IV Piggyback:50] Out: 59 [Drains:60]  PE: Abd: soft, Nt, Nd, +BS, perc chole drain with bilious output  Lab Results:   Recent Labs  12/30/14 0049 12/31/14 0550  WBC 12.8* 9.6  HGB 10.5* 10.0*  HCT 32.8* 30.7*  PLT 272 250   BMET  Recent Labs  12/29/14 0245  NA 139  K 4.5  CL 105  CO2 27  GLUCOSE 74  BUN 21  CREATININE 1.79*  CALCIUM 8.4   PT/INR  Recent Labs  12/30/14 0049 12/31/14 0550  LABPROT 17.1* 19.2*  INR 1.38 1.61*   CMP     Component Value Date/Time   NA 139 12/29/2014 0245   NA 139 12/23/2014 1124   K 4.5 12/29/2014 0245   CL 105 12/29/2014 0245   CO2 27 12/29/2014 0245   GLUCOSE 74 12/29/2014 0245   GLUCOSE 96 12/23/2014 1124   BUN 21 12/29/2014 0245   BUN 34* 12/23/2014 1124   CREATININE 1.79* 12/29/2014 0245   CALCIUM 8.4 12/29/2014 0245   PROT 6.1 12/29/2014 0245   PROT 6.5 12/23/2014 1124   ALBUMIN 3.0* 12/29/2014 0245   AST 23 12/29/2014 0245   ALT 24 12/29/2014 0245   ALKPHOS 54 12/29/2014 0245   BILITOT 0.6 12/29/2014 0245   BILITOT 0.2 12/23/2014 1124   GFRNONAA 35* 12/29/2014 0245   GFRAA 41* 12/29/2014 0245   Lipase     Component Value Date/Time   LIPASE 27 12/27/2014  0741       Studies/Results: No results found.  Anti-infectives: Anti-infectives    Start     Dose/Rate Route Frequency Ordered Stop   01/01/15 1000  levofloxacin (LEVAQUIN) tablet 250 mg  Status:  Discontinued     250 mg Oral Daily 12/31/14 0954 12/31/14 0958   12/31/14 1000  levofloxacin (LEVAQUIN) tablet 500 mg  Status:  Discontinued     500 mg Oral  Once 12/31/14 0954 12/31/14 0958   12/31/14 1000  levofloxacin (LEVAQUIN) tablet 750 mg     750 mg Oral Every 48 hours 12/31/14 0958     12/28/14 1530  levofloxacin (LEVAQUIN) IVPB 500 mg  Status:  Discontinued     500 mg 100 mL/hr over 60 Minutes Intravenous  Once 12/28/14 1459 12/31/14 0820   12/26/14 1800  piperacillin-tazobactam (ZOSYN) IVPB 3.375 g  Status:  Discontinued     3.375 g 12.5 mL/hr over 240 Minutes Intravenous Every 8 hours 12/26/14 1734 12/31/14 0914       Assessment/Plan   1. Acute cholecystitis -s/p perc chole drain -tolerating a regular diet -will need a total of 10 days of abx therapy -follow up in the office in about 3-4 weeks to discuss timing  of elective surgery -stable from our standpoint for DC home when medically stable.  LOS: 6 days    Othel Hoogendoorn E 12/31/2014, 9:59 AM Pager: [phone]

## 2014-12-31 NOTE — Progress Notes (Signed)
Both conjunctiva red with yellowish drainage. NP on call notified. Eye drops (Ketotifen) ordered.

## 2014-12-31 NOTE — Discharge Summary (Addendum)
Physician Discharge Summary  [name] [name] [id number] DOB: 08-Dec-1940 DOA: 12/25/2014  PCP: [name], MD  Admit date: 12/25/2014 Discharge date: 12/31/2014  Time spent: 35 minutes  Recommendations for Outpatient Follow-up:  1. INR check on Thursday- on levaquin for 6 more days 2. Cbc, bmp 1 week 3. H/h RN  Discharge Diagnoses:  Active Problems:   Anemia   Atrial fibrillation, chronic   Coronary atherosclerosis of native coronary artery   Acute cholecystitis   Cholecystitis   Preoperative clearance   Discharge Condition: improved  Diet recommendation: cardiac  Filed Weights   12/29/14 0329 12/30/14 1251 12/30/14 2026  Weight: 95.2 kg (209 lb 14.1 oz) 93.895 kg (207 lb) 94.7 kg (208 lb 12.4 oz)    History of present illness:  74 yo male with CAD s/p CABG, stent, Pafib (CHADS=2) c/o abdominal pain, sharp for the past 3 weeks. Located in the RUQ/epigastric area. Pt notes nausea, Tried rolaids, and ppi but didn't help. Pt denies fever, chills, emesis, diarrhea, brbpr, black stool. Pt went [facility] [employer] 3 weeks ago, and told that he had gallbladder issues, but pain was persistent and therefore presented tonight to [facility], and found to have acute cholecystitis.   Hospital Course:  Acute Cholecystitis: -Gen Surgery, it was felt that patient would benefit from placement of a percutaneous drainage rather than cholecystectomy. Subsequently underwent a cutaneous drainage placement on 3/26, drain in place and draining well. Diet gradually has been advanced, no RUQ pain today.  - total of 10 days of antibiotics on discharge. Will need follow-up at [facility] surgery.   Sepsis:secondary to above. Sepsis pathophysiology has resolved.   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 Continue ASA and statin   Atrial Fibrillation:coumadin reversed in anticipation of cholecystectomy/drain placement. Patient  was started on overlapping heparin infusion and Coumadin on 3/27, on 3/28, patient developed epistaxis 2 which resolved   Mild epistaxis: See above.   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 Monitor lytes periodically.   Hx of Chronic Systolic CHF:EF has now normalized-last EF per echo on 10/14/14 was 55%. Remains clinically compensated. Continue Bidil,lasix   Hypothyroidism:continue Levothyroxine   [id number] Bidil and hydralazine   COPD:Stable, lungs clear. Continue nebs.   Procedures:  Perc-chole drain  Consultations:  General surgery  IR  Discharge Exam: Filed Vitals:   12/31/14 0540  BP: 133/55  Pulse: 83  Temp: 99.1 F (37.3 C)  Resp: 16    General: A+Ox3, NAD Cardiovascular: rrr Respiratory: clear  Discharge Instructions   Discharge Instructions    Diet - low sodium heart healthy    Complete by:  As directed      Discharge instructions    Complete by:  As directed   INR check on Thursday Cbc, bmp  1 week     Increase activity slowly    Complete by:  As directed           Current Discharge Medication List    START taking these medications   Details  docusate sodium (COLACE) 100 MG capsule Take 1 capsule (100 mg total) by mouth 2 (two) times daily. Qty: 10 capsule, Refills: 0    levofloxacin (LEVAQUIN) 750 MG tablet Take 1 tablet (750 mg total) by mouth every other day. Qty: 3 tablet, Refills: 0    oxymetazoline (AFRIN) 0.05 % nasal spray Place 1 spray into both nostrils 2 (two) times daily as needed  for congestion. Qty: 30 mL, Refills: 0    sodium chloride (OCEAN) 0.65 % SOLN nasal spray Place 1 spray into both nostrils as needed for congestion. Refills: 0      CONTINUE these medications which have CHANGED   Details  furosemide (LASIX) 40 MG tablet Take 1 tablet (40 mg total) by mouth daily. Qty: 60 tablet, Refills: 6    HYDROcodone-acetaminophen (NORCO/VICODIN) 5-325 MG per tablet Take 1 tablet by  mouth every 6 (six) hours as needed for moderate pain. Qty: 15 tablet, Refills: 0      CONTINUE these medications which have NOT CHANGED   Details  acetaminophen (TYLENOL) 500 MG tablet Take 1,000 mg by mouth every 6 (six) hours as needed. For pain.     albuterol (PROVENTIL HFA;VENTOLIN HFA) 108 (90 BASE) MCG/ACT inhaler Inhale 2 puffs into the lungs every 6 (six) hours as needed. For shortness of breath. Qty: 1 Inhaler, Refills: 3    aspirin EC 81 MG tablet Take 1 tablet (81 mg total) by mouth every Monday, Wednesday, and Friday.    budesonide (PULMICORT) 0.25 MG/2ML nebulizer solution Take 0.25 mg by nebulization 2 (two) times daily.     Cholecalciferol (VITAMIN D3) 5000 UNITS CAPS Take 5,000 Units by mouth daily.     esomeprazole (NEXIUM) 40 MG capsule Take twice daily on an empty stomach Qty: 60 capsule, Refills: 3    ferrous sulfate 325 (65 FE) MG tablet Take 325 mg by mouth 2 (two) times daily with a meal.     hydrALAZINE (APRESOLINE) 25 MG tablet Take 1 tablet (25 mg total) by mouth 3 (three) times daily. Qty: 90 tablet, Refills: 6    ipratropium-albuterol (DUONEB) 0.5-2.5 (3) MG/3ML SOLN Take 3 mLs by nebulization every 6 (six) hours as needed. For shortness of breath.     isosorbide-hydrALAZINE (BIDIL) 20-37.5 MG per tablet Take 1 tablet by mouth 3 (three) times daily. Qty: 90 tablet, Refills: 11   Associated Diagnoses: Shortness of breath    levothyroxine (SYNTHROID, LEVOTHROID) 112 MCG tablet Take 1 tablet (112 mcg total) by mouth daily before breakfast. Qty: 30 tablet, Refills: 2    lovastatin (MEVACOR) 40 MG tablet Take 1 tablet (40 mg total) by mouth at bedtime. Qty: 90 tablet, Refills: 2    Multiple Vitamins-Minerals (MULTIVITAMINS THER. W/MINERALS) TABS Take 1 tablet by mouth daily.      nitroGLYCERIN (NITROSTAT) 0.4 MG SL tablet Place 0.4 mg under the tongue every 5 (five) minutes as needed for chest pain.    potassium chloride (K-DUR) 10 MEQ tablet Take 1  tablet (10 mEq total) by mouth daily. Qty: 30 tablet, Refills: 5    warfarin (COUMADIN) 5 MG tablet Take 2.5-5 mg by mouth daily. Take 5 mg Monday, Tuesday, Wednesday, Friday, and Sat. Take 2.5 mg on Thursday and Sunday       No Known Allergies Follow-up Information    Follow up with WYATT, JAY, MD. Schedule an appointment as soon as possible for a visit in 4 weeks.   Specialty:  General Surgery   Contact information:   [address] [place] [address] [phone]       Follow up with [name], MD In 1 week.   Specialty:  Family Medicine   Why:  need PT/INR check on Thursday   Contact information:   [place] [address] [phone]        The results of significant diagnostics from this hospitalization (including imaging, microbiology,  ancillary and laboratory) are listed below for reference.    Significant Diagnostic Studies: Dg Chest 2 View  12/26/2014   CLINICAL DATA:  Acute onset of nausea. Preoperative chest radiograph for gallbladder surgery. Initial encounter.  EXAM: CHEST  2 VIEW  COMPARISON:  Chest radiograph performed 11/28/2014  FINDINGS: The lungs are well-aerated. Minimal bibasilar atelectasis is noted. There is no evidence of pleural effusion or pneumothorax.  The heart is borderline enlarged. The patient is status post median sternotomy, with evidence of prior CABG. No acute osseous abnormalities are seen.  IMPRESSION: Minimal bibasilar atelectasis noted.  Borderline cardiomegaly.   Electronically Signed   By: Garald Balding M.D.   On: 12/26/2014 01:46   Ir Perc Cholecystostomy  12/28/2014   INDICATION: 74 year old male with a history of acute cholecystitis. The patient has been assessed as a high surgical risk for complications. He has been referred by surgery for percutaneous cholecystostomy tube.  EXAM: ULTRASOUND AND FLUOROSCOPIC-GUIDED CHOLECYSTOSTOMY TUBE PLACEMENT  COMPARISON:  Ultrasound 12/25/2014, CT 11/26/2014   MEDICATIONS: Fentanyl 125 mcg IV; Versed 3.0 mg IV;  Levaquin 500 mg IV administered.  25 mg Demerol.  ANESTHESIA/SEDATION: Total Moderate Sedation Time  Forty-five minutes  CONTRAST:  72mL OMNIPAQUE IOHEXOL 300 MG/ML  SOLN  FLUOROSCOPY TIME:  2 minutes 6 seconds  COMPLICATIONS: SIR category 2  PROCEDURE: Informed written consent was obtained from the patient after a discussion of the risks, benefits and alternatives to treatment. Questions regarding the procedure were encouraged and answered. A timeout was performed prior to the initiation of the procedure.  The right upper abdominal quadrant was prepped and draped in the usual sterile fashion, and a sterile drape was applied covering the operative field. Maximum barrier sterile technique with sterile gowns and gloves were used for the procedure. A timeout was performed prior to the initiation of the procedure. Local anesthesia was provided with 1% lidocaine with epinephrine.  Ultrasound scanning of the right upper quadrant demonstrates a markedly dilated gallbladder. Of note, the patient reported pain with ultrasound imaging over the gallbladder. Utilizing a transhepatic approach, a 22 gauge needle was advanced into the gallbladder under direct ultrasound guidance. An ultrasound image was saved for documentation purposes. Appropriate intraluminal puncture was confirmed with the efflux of bile and advancement of an 0.018 wire into the gallbladder lumen. The needle was exchanged for an [place] set. A small amount of contrast was injected to confirm appropriate intraluminal positioning. Over a Benson wire, a 84.2-French Cook cholecystomy tube was advanced into the gallbladder fossa, coiled and locked. Bile was aspirated and a small amount of contrast was injected as several post procedural spot radiographic images were obtained in various obliquities. The catheter was secured to the skin with suture, connected to a drainage bag and a dressing was placed.  During  the drain placement, the patient became hypotensive, complained of feeling warm, and experienced nausea with right upper quadrant pain.  No significant blood loss.  IMPRESSION: Status post placement of percutaneous cholecystostomy tube. A sample was sent to the lab for analysis.  Signed,  Dulcy Fanny. Earleen Newport, DO  [facility]  [facility]  PLAN: There is a concern for bacteremia/sepsis after the procedure, given that the patient became hypotensive, nauseated, and complained of feeling warm/febrile.  Levaquin 500 mg IV antibiotics will cover bacteremia.  Fluid bolus of 500 cc was ordered as well as 150 cc/hour of normal saline.  Demerol 25 mg.  Toradol 30 mg IV for 3 doses was ordered.  Zofran 8 mg IV.  The primary medical team was contacted for this result, and potential elevation to a higher level of care was discussed.   Electronically Signed   By: Corrie Mckusick D.O.   On: 12/28/2014 15:28   US Abdomen Limited Ruq  12/25/2014   CLINICAL DATA:  Right upper quadrant pain  EXAM: US ABDOMEN LIMITED - RIGHT UPPER QUADRANT  COMPARISON:  11/26/14  FINDINGS: Gallbladder:  Again noted tumefactive sludge within gallbladder. There is progression of gallbladder wall thickening up to 9.8 mm. Findings are suspicious for acute cholecystitis and clinical correlation is necessary. There is positive sonographic Murphy's sign.  Common bile duct:  Diameter: 5.7 mm in diameter within normal limits.  Liver:  No focal lesion identified. Within normal limits in parenchymal echogenicity.  IMPRESSION: Again noted tumefactive sludge within gallbladder. There is progression of gallbladder wall thickening up to 9.8 mm. Findings are suspicious for acute cholecystitis and clinical correlation is necessary. There is positive sonographic Murphy's sign.   Electronically Signed   By: Lahoma Crocker M.D.   On: 12/25/2014 18:28    Microbiology: Recent Results (from the past 240 hour(s))  Culture, blood  (routine x 2)     Status: None (Preliminary result)   Collection Time: 12/26/14  6:30 PM  Result Value Ref Range Status   Specimen Description BLOOD LEFT ARM  Final   Special Requests BOTTLES DRAWN AEROBIC AND ANAEROBIC 10CC  Final   Culture   Final           BLOOD CULTURE RECEIVED NO GROWTH TO DATE CULTURE WILL BE HELD FOR 5 DAYS BEFORE ISSUING A FINAL NEGATIVE REPORT Note: Culture results may be compromised due to an excessive volume of blood received in culture bottles. Performed at [facility]    Report Status PENDING  Incomplete  Culture, blood (routine x 2)     Status: None (Preliminary result)   Collection Time: 12/26/14  6:40 PM  Result Value Ref Range Status   Specimen Description BLOOD LEFT HAND  Final   Special Requests BOTTLES DRAWN AEROBIC ONLY 6CC  Final   Culture   Final           BLOOD CULTURE RECEIVED NO GROWTH TO DATE CULTURE WILL BE HELD FOR 5 DAYS BEFORE ISSUING A FINAL NEGATIVE REPORT Performed at [facility]    Report Status PENDING  Incomplete  Culture, routine-abscess     Status: None (Preliminary result)   Collection Time: 12/28/14  2:41 PM  Result Value Ref Range Status   Specimen Description GALL BLADDER  Final   Special Requests NONE  Final   Gram Stain   Final    NO WBC SEEN NO SQUAMOUS EPITHELIAL CELLS SEEN NO ORGANISMS SEEN Performed at [facility]    Culture   Final    NO GROWTH 2 DAYS Performed at [facility]    Report Status PENDING  Incomplete     Labs: Basic Metabolic Panel:  Recent Labs Lab 12/25/14 1502 12/26/14 0707 12/27/14 0741 12/28/14 0508 12/29/14 0245  NA 138 137 139 139 139  K 5.0 4.4 4.7 4.2 4.5  CL 101 104 102 99 105  CO2 27 24 27  35* 27  GLUCOSE 105* 92 92 92 74  BUN 43* 41* 30* 24* 21  CREATININE 2.19* 1.94* 1.76* 1.82* 1.79*  CALCIUM 9.2 8.6 9.0 8.8 8.4   Liver Function Tests:  Recent Labs Lab 12/25/14 1502 12/26/14 0707 12/27/14 0741 12/28/14 0508 12/29/14  0245   AST 26 25 24 25 23   ALT 40 33 28 28 24   ALKPHOS 65 55 60 60 54  BILITOT 0.5 0.7 0.6 0.8 0.6  PROT 7.2 6.2 6.7 6.6 6.1  ALBUMIN 4.0 3.3* 3.6 3.4* 3.0*    Recent Labs Lab 12/25/14 1502 12/27/14 0741  LIPASE 25 27   No results for input(s): AMMONIA in the last 168 hours. CBC:  Recent Labs Lab 12/25/14 1502 12/26/14 0707 12/27/14 0741 12/28/14 0508 12/29/14 0245 12/30/14 0049 12/31/14 0550  WBC 11.7* 10.3 9.8 10.3 10.3 12.8* 9.6  NEUTROABS 8.7* 6.8  --   --   --   --   --   HGB 12.1* 10.9* 10.6* 10.7* 10.1* 10.5* 10.0*  HCT 37.1* 33.5* 32.9* 33.5* 31.9* 32.8* 30.7*  MCV 94.2 95.4 95.4 95.7 96.7 96.5 95.6  PLT 401* 331 315 319 278 272 250   Cardiac Enzymes:  Recent Labs Lab 12/25/14 2031  TROPONINI 0.04*   BNP: BNP (last 3 results) No results for input(s): BNP in the last 8760 hours.  ProBNP (last 3 results)  Recent Labs  01/23/14 0842 06/04/14 1056  PROBNP 111.0* 147.0*    CBG: No results for input(s): GLUCAP in the last 168 hours.     SignedEulogio Bear  [facility] 12/31/2014, 11:14 AM

## 2014-12-31 NOTE — Discharge Instructions (Signed)
Cholecystostomy  °The gallbladder is a pear-shaped organ that lies beneath the liver on the right side of the body. The gallbladder stores bile, a fluid that helps the body digest fats. However, sometimes bile and other fluids build up in the gallbladder because of an obstruction (for example, a gallstones). This can cause fever, pain, swelling, nausea and other serious symptoms. °The procedure used to drain these fluids is called a cholecystostomy. A tube is inserted into the gallbladder. Fluid drains through the tube into a plastic bag outside the body. This procedure is usually done on people who are admitted to the hospital. °The procedure is often recommended for people who cannot have gallbladder surgery right away, usually because they are too ill to make it through surgery. The cholecystostomy tube is usually temporary, until surgery can be done. °RISKS AND COMPLICATIONS °Although rare, complications can include: °· Clogging of the tube. °· Infection in or around the drain site. Antibiotics might be prescribed for the infection. Or, another tube might be inserted to drain the infected fluid. °· Internal bleeding from the liver. °BEFORE THE PROCEDURE  °· Try to quit smoking several weeks before the procedure. Smoking can slow healing. °· Arrange for someone to drive you home from the hospital. °· Right before your procedure, avoid all foods and liquids after midnight. This includes coffee, tea and water. °· On the day of the procedure, arrive early to fill out all the paperwork. °PROCEDURE °You will be given a sedative to make you sleepy and a local anesthetic to numb the skin. Next, a small cut is made in the abdomen. Then a tube is threaded through the cut into the gallbladder. The procedure is usually done with ultrasound to guide the tube into the gallbladder. Once the tube is in place, the drain is secured to the skin with a stitch. The tube is then connected to a drainage bag.  °AFTER THE PROCEDURE   °· People who have a cholecystostomy usually stay in the hospital for several days because they are so ill. You might not be able to eat for the first few days. Instead, you will be connected to an IV for fluids and nutrients. °· The procedure does not cure the blockage that caused the fluid to build up in the first place. Because of this, the gallbladder will need to be removed in the future. The drain is removed at that time. °HOME CARE INSTRUCTIONS  °Be sure to follow your healthcare provider's instructions carefully. You may shower but avoid tub baths and swimming until your caregiver says it is OK. Eat and drink according to the directions you have been given. And be sure to make all follow-up appointments.  °Call your healthcare provider if you notice new pain, redness or swelling around the wound. °SEEK IMMEDIATE MEDICAL CARE IF:  °· There is increased abdominal pain. °· Nausea or vomiting occurs. °· You develop a fever. °· The drainage tube comes out of the abdomen. °Document Released: 12/17/2008 Document Revised: 12/13/2011 Document Reviewed: 12/17/2008 °ExitCare® Patient Information ©2015 [employer]. This information is not intended to replace advice given to you by your health care provider. Make sure you discuss any questions you have with your health care provider. ° °

## 2014-12-31 NOTE — Progress Notes (Signed)
Percutaneous drain emptied and flushed as ordered. Dressing changed as per order. Tolerated well.

## 2014-12-31 NOTE — Care Management Note (Signed)
CARE MANAGEMENT NOTE 12/31/2014  Patient:  [name], [name]   Account Number:  [id number]  Date Initiated:  12/26/2014  Documentation initiated by:  Tomi Bamberger  Subjective/Objective Assessment:   dx acute cholecystitis  admit- lives with spouse., indep.     Action/Plan:   Anticipated DC Date:  01/01/2015   Anticipated DC Plan:  [place]  CM consult      Choice offered to / List presented to:          [facility] arranged  HH-1 RN      [place].   Status of service:  Completed, signed off Medicare Important Message given?  YES (If response is "NO", the following Medicare IM given date fields will be blank) Date Medicare IM given:  12/31/2014 Medicare IM given by:  Calloway Andrus Date Additional Medicare IM given:   Additional Medicare IM given by:    Discharge Disposition:    Per UR Regulation:  Reviewed for med. necessity/level of care/duration of stay  If discussed at [place] of Stay Meetings, dates discussed:    Comments:  12/31/2014 Per pt he has home Oxygen which he uses at night, pt with oxygen on at this point, however he states that he will not need this to d/c to home?  This CM concerned that if he needs oxygen now, then he would most likely need it for the trip home.  Will ask RN to wean. [place] notified of [place] needs. CRoyal RN MPH, case manager, [phone]  12/26/14 1825 Tomi Bamberger RN BSN [phone] patient lives with spouse, pta indep.  NCM will cont to follow for dc needs.

## 2014-12-31 NOTE — Progress Notes (Signed)
Patient ambulated in hallway with PT using walker. Tolerated well. Up in chair at this time.

## 2014-12-31 NOTE — Progress Notes (Addendum)
ANTICOAGULATION CONSULT NOTE - Follow Up Consult  Pharmacy Consult:  Coumadin Indication: atrial fibrillation  No Known Allergies  Patient Measurements: Height: 5' 10.5" (179.1 cm) Weight: 208 lb 12.4 oz (94.7 kg) IBW/kg (Calculated) : 74.15  Vital Signs: Temp: 99.1 F (37.3 C) (03/29 0540) Temp Source: Oral (03/29 0540) BP: 133/55 mmHg (03/29 0540) Pulse Rate: 83 (03/29 0540)  Labs:  Recent Labs  12/29/14 0245 12/29/14 1602 12/30/14 0049 12/31/14 0550  HGB 10.1*  --  10.5* 10.0*  HCT 31.9*  --  32.8* 30.7*  PLT 278  --  272 250  LABPROT  --   --  17.1* 19.2*  INR  --   --  1.38 1.61*  HEPARINUNFRC  --  0.23* 0.59  --   CREATININE 1.79*  --   --   --     Estimated Creatinine Clearance: 41.6 mL/min (by C-G formula based on Cr of 1.79).     Assessment: 73 YOM on Coumadin PTA for Afib, admitted with acute cholecystitis.  Coumadin was reversed for percutaneous cholecystostomy drain placement on 12/28/14. On 12/29/14, Coumadin was resumed with a heparin bridge; however, heparin was stopped due to nosebleeds.  Patient continues on Coumadin and INR trending up nicely.  No further nosebleeds per RN.  Home dose: 5mg  daily except 2.5mg  on Thursday and Sunday   Goal of Therapy:  INR 2-3    Plan:  - Coumadin 5mg  PO today - INR in AM if still here    Jasemine Nawaz D. Mina Marble, PharmD, BCPS Pager:  [phone] 12/31/2014, 8:19 AM    ===========================================   Addendum: - change antibiotics from Zosyn to [facility] for discharge, diagnosis: intra-abd infection - may increase the effect of Coumadin   Plan: - Levaquin 750mg  PO Q48H - Unsure of Levaquin's effect on patient's INR, INR was therapeutic on admit on home regimen but currently sub-therapeutic.  Okay to discharge on home regimen and have INR checked on Thurs for further adjustment.   Joyell Emami D. Mina Marble, PharmD, BCPS Pager:  [phone] 12/31/2014, 9:50 AM

## 2015-01-01 DIAGNOSIS — I2510 Atherosclerotic heart disease of native coronary artery without angina pectoris: Secondary | ICD-10-CM | POA: Diagnosis not present

## 2015-01-01 DIAGNOSIS — N183 Chronic kidney disease, stage 3 (moderate): Secondary | ICD-10-CM | POA: Diagnosis not present

## 2015-01-01 DIAGNOSIS — Z7901 Long term (current) use of anticoagulants: Secondary | ICD-10-CM | POA: Diagnosis not present

## 2015-01-01 DIAGNOSIS — D649 Anemia, unspecified: Secondary | ICD-10-CM | POA: Diagnosis not present

## 2015-01-01 DIAGNOSIS — Z951 Presence of aortocoronary bypass graft: Secondary | ICD-10-CM | POA: Diagnosis not present

## 2015-01-01 DIAGNOSIS — I480 Paroxysmal atrial fibrillation: Secondary | ICD-10-CM | POA: Diagnosis not present

## 2015-01-01 DIAGNOSIS — Z87891 Personal history of nicotine dependence: Secondary | ICD-10-CM | POA: Diagnosis not present

## 2015-01-01 DIAGNOSIS — I129 Hypertensive chronic kidney disease with stage 1 through stage 4 chronic kidney disease, or unspecified chronic kidney disease: Secondary | ICD-10-CM | POA: Diagnosis not present

## 2015-01-01 DIAGNOSIS — Z48815 Encounter for surgical aftercare following surgery on the digestive system: Secondary | ICD-10-CM | POA: Diagnosis not present

## 2015-01-01 LAB — CULTURE, ROUTINE-ABSCESS
CULTURE: NO GROWTH
Gram Stain: NONE SEEN

## 2015-01-02 ENCOUNTER — Ambulatory Visit (INDEPENDENT_AMBULATORY_CARE_PROVIDER_SITE_OTHER): Payer: Medicare Other | Admitting: Pharmacist

## 2015-01-02 VITALS — Ht 70.5 in | Wt 202.0 lb

## 2015-01-02 DIAGNOSIS — I482 Chronic atrial fibrillation, unspecified: Secondary | ICD-10-CM

## 2015-01-02 DIAGNOSIS — D649 Anemia, unspecified: Secondary | ICD-10-CM

## 2015-01-02 DIAGNOSIS — I4891 Unspecified atrial fibrillation: Secondary | ICD-10-CM | POA: Diagnosis not present

## 2015-01-02 LAB — CULTURE, BLOOD (ROUTINE X 2)
CULTURE: NO GROWTH
Culture: NO GROWTH

## 2015-01-02 LAB — POCT INR: INR: 2.2

## 2015-01-02 LAB — POCT HEMOGLOBIN: Hemoglobin: 11.7 g/dL — AB (ref 14.1–18.1)

## 2015-01-02 NOTE — Patient Instructions (Signed)
Anticoagulation Dose Instructions as of 01/02/2015      [name] [name] Tue Wed Thu Fri Sat   New Dose 2.5 mg 5 mg 5 mg 5 mg 2.5 mg 5 mg 5 mg    Description        Goal INR 1.8-2.0 Continue warfarin 1/2 tablet on sundays and thursdays.  Take 1 tablet all other days.          INR was 2.2 today

## 2015-01-02 NOTE — Progress Notes (Signed)
Subjective:     Indication: atrial fibrillation Bleeding signs/symptoms: None but patient was recnetly released from hospital after gall bladder surgery / low hemoglobin.  Thromboembolic signs/symptoms: None  Missed Coumadin doses: None Medication changes: yes - taking ABX post surgery levofloxacin.  Last dose will be Monday 01/06/15 Dietary changes: yes - decreased intake - patient has lost about 15# over last month Bacterial/viral infection:  yes  HGB was 10.0 in hospital  The following portions of the patient's history were reviewed and updated as appropriate: allergies, current medications, past family history, past medical history, past social history, past surgical history and problem list.    Objective:    INR Today: 2.2 Current dose: 2.5mg  on Sundays and Thursdays.  5mg  all other days  HBG was 11.7 today  Assessment:   Slightly supratherpeutic per patient's goal -  INR goal of 1.8 to 2.0   Low hemoglobin - improved since hospitalization  Plan:    1. New dose: no change   2. Next INR: 2 weeks    Cherre Robins, PharmD, CPP

## 2015-01-04 DIAGNOSIS — K812 Acute cholecystitis with chronic cholecystitis: Secondary | ICD-10-CM | POA: Diagnosis not present

## 2015-01-06 ENCOUNTER — Observation Stay (HOSPITAL_COMMUNITY): Payer: Medicare Other

## 2015-01-06 ENCOUNTER — Encounter (HOSPITAL_COMMUNITY): Payer: Self-pay

## 2015-01-06 ENCOUNTER — Inpatient Hospital Stay (HOSPITAL_COMMUNITY)
Admission: EM | Admit: 2015-01-06 | Discharge: 2015-01-08 | DRG: 983 | Disposition: A | Discharge status: Home / Self Care | Payer: Medicare Other | Attending: Internal Medicine | Admitting: Internal Medicine

## 2015-01-06 ENCOUNTER — Other Ambulatory Visit (HOSPITAL_COMMUNITY): Payer: Self-pay | Admitting: Diagnostic Radiology

## 2015-01-06 ENCOUNTER — Ambulatory Visit (HOSPITAL_COMMUNITY)
Admission: RE | Admit: 2015-01-06 | Discharge: 2015-01-06 | Disposition: A | Discharge status: Home / Self Care | Payer: Medicare Other | Source: Ambulatory Visit | Attending: Diagnostic Radiology | Admitting: Diagnostic Radiology

## 2015-01-06 ENCOUNTER — Other Ambulatory Visit: Payer: Self-pay

## 2015-01-06 DIAGNOSIS — R109 Unspecified abdominal pain: Secondary | ICD-10-CM | POA: Diagnosis not present

## 2015-01-06 DIAGNOSIS — J439 Emphysema, unspecified: Secondary | ICD-10-CM | POA: Diagnosis present

## 2015-01-06 DIAGNOSIS — I2510 Atherosclerotic heart disease of native coronary artery without angina pectoris: Secondary | ICD-10-CM | POA: Diagnosis present

## 2015-01-06 DIAGNOSIS — K819 Cholecystitis, unspecified: Secondary | ICD-10-CM | POA: Diagnosis present

## 2015-01-06 DIAGNOSIS — K219 Gastro-esophageal reflux disease without esophagitis: Secondary | ICD-10-CM | POA: Diagnosis present

## 2015-01-06 DIAGNOSIS — I482 Chronic atrial fibrillation, unspecified: Secondary | ICD-10-CM | POA: Diagnosis present

## 2015-01-06 DIAGNOSIS — N183 Chronic kidney disease, stage 3 unspecified: Secondary | ICD-10-CM | POA: Diagnosis present

## 2015-01-06 DIAGNOSIS — E785 Hyperlipidemia, unspecified: Secondary | ICD-10-CM | POA: Diagnosis present

## 2015-01-06 DIAGNOSIS — Z9049 Acquired absence of other specified parts of digestive tract: Secondary | ICD-10-CM | POA: Diagnosis not present

## 2015-01-06 DIAGNOSIS — Z7901 Long term (current) use of anticoagulants: Secondary | ICD-10-CM | POA: Diagnosis not present

## 2015-01-06 DIAGNOSIS — L0291 Cutaneous abscess, unspecified: Secondary | ICD-10-CM

## 2015-01-06 DIAGNOSIS — K828 Other specified diseases of gallbladder: Secondary | ICD-10-CM | POA: Diagnosis not present

## 2015-01-06 DIAGNOSIS — T83010A Breakdown (mechanical) of cystostomy catheter, initial encounter: Secondary | ICD-10-CM | POA: Diagnosis not present

## 2015-01-06 DIAGNOSIS — T85628A Displacement of other specified internal prosthetic devices, implants and grafts, initial encounter: Secondary | ICD-10-CM | POA: Diagnosis not present

## 2015-01-06 DIAGNOSIS — Y833 Surgical operation with formation of external stoma as the cause of abnormal reaction of the patient, or of later complication, without mention of misadventure at the time of the procedure: Secondary | ICD-10-CM | POA: Diagnosis present

## 2015-01-06 LAB — URINE MICROSCOPIC-ADD ON

## 2015-01-06 LAB — COMPREHENSIVE METABOLIC PANEL
ALK PHOS: 70 U/L (ref 39–117)
ALT: 27 U/L (ref 0–53)
ANION GAP: 16 — AB (ref 5–15)
AST: 35 U/L (ref 0–37)
Albumin: 4.2 g/dL (ref 3.5–5.2)
BUN: 30 mg/dL — AB (ref 6–23)
CO2: 24 mmol/L (ref 19–32)
Calcium: 9.8 mg/dL (ref 8.4–10.5)
Chloride: 98 mmol/L (ref 96–112)
Creatinine, Ser: 1.9 mg/dL — ABNORMAL HIGH (ref 0.50–1.35)
GFR calc non Af Amer: 33 mL/min — ABNORMAL LOW (ref >90)
GFR, EST AFRICAN AMERICAN: 38 mL/min — AB (ref >90)
Glucose, Bld: 139 mg/dL — ABNORMAL HIGH (ref 70–99)
POTASSIUM: 4.5 mmol/L (ref 3.5–5.1)
SODIUM: 138 mmol/L (ref 135–145)
TOTAL PROTEIN: 9.3 g/dL — AB (ref 6.0–8.3)
Total Bilirubin: 0.7 mg/dL (ref 0.3–1.2)

## 2015-01-06 LAB — URINALYSIS, ROUTINE W REFLEX MICROSCOPIC
Glucose, UA: NEGATIVE mg/dL
HGB URINE DIPSTICK: NEGATIVE
KETONES UR: NEGATIVE mg/dL
NITRITE: NEGATIVE
PROTEIN: 30 mg/dL — AB
Specific Gravity, Urine: 1.023 (ref 1.005–1.030)
Urobilinogen, UA: 0.2 mg/dL (ref 0.0–1.0)
pH: 5 (ref 5.0–8.0)

## 2015-01-06 LAB — CBC WITH DIFFERENTIAL/PLATELET
Basophils Absolute: 0.1 10*3/uL (ref 0.0–0.1)
Basophils Relative: 1 % (ref 0–1)
EOS ABS: 0.6 10*3/uL (ref 0.0–0.7)
Eosinophils Relative: 4 % (ref 0–5)
HCT: 40.2 % (ref 39.0–52.0)
Hemoglobin: 13.4 g/dL (ref 13.0–17.0)
Lymphocytes Relative: 12 % (ref 12–46)
Lymphs Abs: 1.8 10*3/uL (ref 0.7–4.0)
MCH: 31.4 pg (ref 26.0–34.0)
MCHC: 33.3 g/dL (ref 30.0–36.0)
MCV: 94.1 fL (ref 78.0–100.0)
Monocytes Absolute: 1.4 10*3/uL — ABNORMAL HIGH (ref 0.1–1.0)
Monocytes Relative: 9 % (ref 3–12)
NEUTROS PCT: 74 % (ref 43–77)
Neutro Abs: 11.9 10*3/uL — ABNORMAL HIGH (ref 1.7–7.7)
Platelets: 388 10*3/uL (ref 150–400)
RBC: 4.27 MIL/uL (ref 4.22–5.81)
RDW: 14.1 % (ref 11.5–15.5)
WBC: 15.9 10*3/uL — AB (ref 4.0–10.5)

## 2015-01-06 LAB — LIPASE, BLOOD: LIPASE: 24 U/L (ref 11–59)

## 2015-01-06 LAB — I-STAT CG4 LACTIC ACID, ED: Lactic Acid, Venous: 2.34 mmol/L (ref 0.5–2.0)

## 2015-01-06 LAB — I-STAT TROPONIN, ED: Troponin i, poc: 0.05 ng/mL (ref 0.00–0.08)

## 2015-01-06 LAB — PROTIME-INR
INR: 1.78 — ABNORMAL HIGH (ref 0.00–1.49)
PROTHROMBIN TIME: 20.8 s — AB (ref 11.6–15.2)

## 2015-01-06 MED ORDER — PANTOPRAZOLE SODIUM 40 MG PO TBEC
40.0000 mg | DELAYED_RELEASE_TABLET | Freq: Two times a day (BID) | ORAL | Status: DC
  Administered 2015-01-06 – 2015-01-08 (×4): 40 mg via ORAL
  Filled 2015-01-06 (×4): qty 1

## 2015-01-06 MED ORDER — MAGNESIUM HYDROXIDE 400 MG/5ML PO SUSP
30.0000 mL | Freq: Every day | ORAL | Status: DC | PRN
Start: 2015-01-06 — End: 2015-01-08

## 2015-01-06 MED ORDER — HYDROCODONE-ACETAMINOPHEN 5-325 MG PO TABS
1.0000 | ORAL_TABLET | Freq: Once | ORAL | Status: AC
  Administered 2015-01-06: 1 via ORAL

## 2015-01-06 MED ORDER — SALINE SPRAY 0.65 % NA SOLN
1.0000 | NASAL | Status: DC | PRN
  Filled 2015-01-06: qty 44

## 2015-01-06 MED ORDER — SODIUM CHLORIDE 0.9 % IV SOLN
3.0000 g | Freq: Four times a day (QID) | INTRAVENOUS | Status: DC
  Administered 2015-01-06 – 2015-01-07 (×3): 3 g via INTRAVENOUS
  Filled 2015-01-06 (×5): qty 3

## 2015-01-06 MED ORDER — ISOSORB DINITRATE-HYDRALAZINE 20-37.5 MG PO TABS
1.0000 | ORAL_TABLET | Freq: Three times a day (TID) | ORAL | Status: DC
  Administered 2015-01-06 – 2015-01-08 (×5): 1 via ORAL
  Filled 2015-01-06 (×7): qty 1

## 2015-01-06 MED ORDER — HYDROCODONE-ACETAMINOPHEN 5-325 MG PO TABS
1.0000 | ORAL_TABLET | Freq: Four times a day (QID) | ORAL | Status: DC | PRN
  Administered 2015-01-06 – 2015-01-07 (×4): 1 via ORAL
  Filled 2015-01-06 (×4): qty 1

## 2015-01-06 MED ORDER — ONDANSETRON HCL 4 MG/2ML IJ SOLN
4.0000 mg | Freq: Four times a day (QID) | INTRAMUSCULAR | Status: DC | PRN
  Administered 2015-01-07 – 2015-01-08 (×2): 4 mg via INTRAVENOUS
  Filled 2015-01-06 (×2): qty 2

## 2015-01-06 MED ORDER — HYDROMORPHONE HCL 1 MG/ML IJ SOLN: 1.0000 mg | INTRAMUSCULAR | Status: DC | PRN

## 2015-01-06 MED ORDER — ALUM & MAG HYDROXIDE-SIMETH 200-200-20 MG/5ML PO SUSP: 30.0000 mL | Freq: Four times a day (QID) | ORAL | Status: DC | PRN

## 2015-01-06 MED ORDER — IPRATROPIUM-ALBUTEROL 0.5-2.5 (3) MG/3ML IN SOLN
3.0000 mL | Freq: Four times a day (QID) | RESPIRATORY_TRACT | Status: DC | PRN
  Administered 2015-01-06 – 2015-01-08 (×2): 3 mL via RESPIRATORY_TRACT
  Filled 2015-01-06 (×2): qty 3

## 2015-01-06 MED ORDER — ACETAMINOPHEN 325 MG PO TABS: 650.0000 mg | ORAL_TABLET | Freq: Four times a day (QID) | ORAL | Status: DC | PRN

## 2015-01-06 MED ORDER — DOCUSATE SODIUM 100 MG PO CAPS
100.0000 mg | ORAL_CAPSULE | Freq: Two times a day (BID) | ORAL | Status: DC
  Administered 2015-01-06 – 2015-01-08 (×4): 100 mg via ORAL
  Filled 2015-01-06 (×4): qty 1

## 2015-01-06 MED ORDER — SODIUM CHLORIDE 0.9 % IV SOLN
Freq: Once | INTRAVENOUS | Status: AC
  Administered 2015-01-06: 16:00:00 via INTRAVENOUS

## 2015-01-06 MED ORDER — IOHEXOL 300 MG/ML  SOLN
50.0000 mL | Freq: Once | INTRAMUSCULAR | Status: AC | PRN
Start: 2015-01-06 — End: 2015-01-06
  Administered 2015-01-06: 10 mL

## 2015-01-06 MED ORDER — HYDROMORPHONE HCL 1 MG/ML IJ SOLN
1.0000 mg | Freq: Once | INTRAMUSCULAR | Status: AC
  Administered 2015-01-06: 1 mg via INTRAVENOUS
  Filled 2015-01-06: qty 1

## 2015-01-06 MED ORDER — LEVOTHYROXINE SODIUM 100 MCG PO TABS
100.0000 ug | ORAL_TABLET | Freq: Every day | ORAL | Status: DC
  Administered 2015-01-07 – 2015-01-08 (×2): 100 ug via ORAL
  Filled 2015-01-06 (×2): qty 1

## 2015-01-06 MED ORDER — PHYTONADIONE 5 MG PO TABS
10.0000 mg | ORAL_TABLET | Freq: Once | ORAL | Status: AC
  Administered 2015-01-06: 10 mg via ORAL
  Filled 2015-01-06 (×3): qty 2

## 2015-01-06 MED ORDER — PRAVASTATIN SODIUM 10 MG PO TABS
40.0000 mg | ORAL_TABLET | Freq: Every day | ORAL | Status: DC
  Administered 2015-01-07: 40 mg via ORAL
  Filled 2015-01-06: qty 4

## 2015-01-06 MED ORDER — HYDROCODONE-ACETAMINOPHEN 5-325 MG PO TABS
ORAL_TABLET | ORAL | Status: AC
  Administered 2015-01-06: 1 via ORAL
  Filled 2015-01-06: qty 1

## 2015-01-06 MED ORDER — NITROGLYCERIN 0.4 MG SL SUBL: 0.4000 mg | SUBLINGUAL_TABLET | SUBLINGUAL | Status: DC | PRN

## 2015-01-06 MED ORDER — ONDANSETRON HCL 4 MG/2ML IJ SOLN
4.0000 mg | Freq: Once | INTRAMUSCULAR | Status: AC
  Administered 2015-01-06: 4 mg via INTRAVENOUS
  Filled 2015-01-06: qty 2

## 2015-01-06 MED ORDER — ONDANSETRON HCL 4 MG PO TABS: 4.0000 mg | ORAL_TABLET | Freq: Four times a day (QID) | ORAL | Status: DC | PRN

## 2015-01-06 MED ORDER — SODIUM CHLORIDE 0.9 % IV SOLN
INTRAVENOUS | Status: DC
  Administered 2015-01-06 – 2015-01-08 (×2): via INTRAVENOUS

## 2015-01-06 MED ORDER — IOHEXOL 300 MG/ML  SOLN
25.0000 mL | INTRAMUSCULAR | Status: AC
  Administered 2015-01-06: 25 mL via ORAL

## 2015-01-06 MED ORDER — ACETAMINOPHEN 650 MG RE SUPP: 650.0000 mg | Freq: Four times a day (QID) | RECTAL | Status: DC | PRN

## 2015-01-06 MED ORDER — LIDOCAINE HCL 1 % IJ SOLN
INTRAMUSCULAR | Status: AC
  Filled 2015-01-06: qty 20

## 2015-01-06 MED ORDER — BUDESONIDE 0.25 MG/2ML IN SUSP
0.2500 mg | Freq: Two times a day (BID) | RESPIRATORY_TRACT | Status: DC
  Administered 2015-01-06 – 2015-01-08 (×4): 0.25 mg via RESPIRATORY_TRACT
  Filled 2015-01-06 (×5): qty 2

## 2015-01-06 NOTE — H&P (Signed)
[facility] History and Physical  [name] [name] [id number] DOB: 09-07-1941 DOA: 01/06/2015  Referring physician: EDP PCP: [name], MD   Chief Complaint: abd pain and drain not working  HPI: [name] [name] is a 74 y.o. male  With recent admission for acute/chronic cholecystitis. He has a history of atrial fibrillation and heart disease gastroesophageal reflux disease hypertension hyperlipidemia and was cleared by cardiology for cholecystectomy. However, surgery opted for cholecystostomy tube, placed by IR, as concern about needing open cholecystectomy. Patient was discharged on 12/31/2014 by hospitalist service. Today would be his last day of levofloxacin. About 5 days ago, his drain stopped working and his abdominal pain increased. He was seen by Dr. Anselm Pancoast of interventional radiology today who tried fixing the drain. The drain was unable to be cleared, an attempt was made to switch the drain out but was unsuccessful. Dr. Anselm Pancoast spoke with Dr. Lucia Gaskins of general surgery and recommended ER evaluation for pain control and admission for observation to rule out developing peritonitis. ED physician spoke with Dr. Lucia Gaskins who will be consult taking and recommends admission to medicine and likely replacement of drain tomorrow. I've spoken with Dr. Anselm Pancoast and he recommends repeat CAT scan of the abdomen and pelvis to rule out perforation or other complications. His appetite has been poor but he denies vomiting. Denies diarrhea. No fevers or chills.   Review of Systems:  As per history of present illness. Complete systems reviewed otherwise negative.  Past Medical History  Diagnosis Date  . Unspecified essential hypertension   . Anemia   . Asthma   . Hepatomegaly   . Esophageal reflux   . Other and unspecified coagulation defects   . Hiatal hernia   . Personal history of colonic polyps 05/29/2010    TUBULAR ADENOMA  . CAD (coronary artery disease)   . Hypothyroidism   . Gout   .  Hyperlipidemia   . Gastric antral vascular ectasia 2013  . Atrial fibrillation   . History of blood transfusion ~ 2012    "blood count dropped; had to get 3 units"  . Arthritis     "hips; back" (07/09/2014)  . On home oxygen therapy     "2L at night" (07/09/2014)  . COPD (chronic obstructive pulmonary disease)    Past Surgical History  Procedure Laterality Date  . Umbilical hernia repair    . Coronary angioplasty with stent placement  ~ 2008; 07/08/2014    "1 + 3"  . 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 Hernia repair    . Umbilical hernia repair  1970's  . Left and right heart catheterization with coronary/graft angiogram N/A 07/09/2014    Procedure: LEFT AND RIGHT HEART CATHETERIZATION WITH Beatrix Fetters;  Surgeon: Sinclair Grooms, MD;  Location: [facility] CATH LAB;  Service: Cardiovascular;  Laterality: N/A;  . Colonoscopy  2013    Dr. Sharlett Iles: no polyps or evidence of active bleeding  . Esophagogastroduodenoscopy  2013    Dr. 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 Givens capsule study  2013    Dr. Sharlett Iles: AVM at 5 minutes beyond first duodenal image. If persistent IDA, bleeding, recommend enteroscopy with ablation    Social History:  reports that he quit smoking about 18 years ago. His smoking use included Cigarettes. He has a 42 pack-year smoking history. He has never used smokeless tobacco. He reports that he drinks about 0.6 oz of alcohol per week.  He reports that he does not use illicit drugs.  No Known Allergies  Family History  Problem Relation Age of Onset  . Leukemia Mother   . Kidney disease Mother     kidney removed   . Colon cancer Neg Hx   . Heart attack Brother   . Hypertension      family   . Cancer Father   . Heart disease Father   . Stomach cancer Sister   . Stomach cancer Sister   . Early death Brother     Prior to Admission medications   Medication Sig Start Date End Date Taking?  Authorizing Provider  acetaminophen (TYLENOL) 500 MG tablet Take 1,000 mg by mouth every 6 (six) hours as needed. For pain.    Yes Historical Provider, MD  albuterol (PROVENTIL HFA;VENTOLIN HFA) 108 (90 BASE) MCG/ACT inhaler Inhale 2 puffs into the lungs every 6 (six) hours as needed. For shortness of breath. 11/22/14  Yes Chipper Herb, MD  aspirin EC 81 MG tablet Take 1 tablet (81 mg total) by mouth every Monday, Wednesday, and Friday. 10/08/14  Yes Belva Crome, MD  budesonide (PULMICORT) 0.25 MG/2ML nebulizer solution Take 0.25 mg by nebulization 2 (two) times daily.    Yes Historical Provider, MD  Cholecalciferol (VITAMIN D3) 5000 UNITS CAPS Take 5,000 Units by mouth daily.    Yes Historical Provider, MD  docusate sodium (COLACE) 100 MG capsule Take 1 capsule (100 mg total) by mouth 2 (two) times daily. 12/31/14  Yes Geradine Girt, DO  esomeprazole (NEXIUM) 40 MG capsule Take twice daily on an empty stomach 12/20/14  Yes Claretta Fraise, MD  ferrous sulfate 325 (65 FE) MG tablet Take 325 mg by mouth 2 (two) times daily with a meal.    Yes Historical Provider, MD  furosemide (LASIX) 40 MG tablet Take 1 tablet (40 mg total) by mouth daily. 12/31/14  Yes Geradine Girt, DO  hydrALAZINE (APRESOLINE) 25 MG tablet Take 1 tablet (25 mg total) by mouth 3 (three) times daily. 10/30/14  Yes Belva Crome, MD  HYDROcodone-acetaminophen (NORCO/VICODIN) 5-325 MG per tablet Take 1 tablet by mouth every 6 (six) hours as needed for moderate pain. 12/31/14  Yes Jessica U Vann, DO  ipratropium-albuterol (DUONEB) 0.5-2.5 (3) MG/3ML SOLN Take 3 mLs by nebulization every 6 (six) hours as needed. For shortness of breath.    Yes Historical Provider, MD  isosorbide-hydrALAZINE (BIDIL) 20-37.5 MG per tablet Take 1 tablet by mouth 3 (three) times daily. 06/25/14  Yes Scott T Kathlen Mody, PA-C  levofloxacin (LEVAQUIN) 750 MG tablet Take 1 tablet (750 mg total) by mouth every other day. 12/31/14  Yes Geradine Girt, DO  levothyroxine  (SYNTHROID, LEVOTHROID) 112 MCG tablet Take 1 tablet (112 mcg total) by mouth daily before breakfast. 12/12/14  Yes Claretta Fraise, MD  lovastatin (MEVACOR) 40 MG tablet Take 1 tablet (40 mg total) by mouth at bedtime. 12/07/13  Yes Belva Crome, MD  Multiple Vitamins-Minerals (MULTIVITAMINS THER. W/MINERALS) TABS Take 1 tablet by mouth daily.     Yes Historical Provider, MD  oxymetazoline (AFRIN) 0.05 % nasal spray Place 1 spray into both nostrils 2 (two) times daily as needed for congestion. 12/31/14  Yes Geradine Girt, DO  potassium chloride (K-DUR) 10 MEQ tablet Take 1 tablet (10 mEq total) by mouth daily. 10/09/14  Yes Belva Crome, MD  sodium chloride (OCEAN) 0.65 % SOLN nasal spray Place 1 spray into both nostrils as needed for congestion.  12/31/14  Yes Geradine Girt, DO  warfarin (COUMADIN) 5 MG tablet Take 2.5-5 mg by mouth daily. Take 5 mg Monday, Tuesday, Wednesday, Friday, and Sat. Take 2.5 mg on Thursday and Sunday   Yes Historical Provider, MD  nitroGLYCERIN (NITROSTAT) 0.4 MG SL tablet Place 0.4 mg under the tongue every 5 (five) minutes as needed for chest pain.    Historical Provider, MD   Physical Exam: Filed Vitals:   01/06/15 1510 01/06/15 1530 01/06/15 1615 01/06/15 1626  BP: 178/72 173/72 160/67 160/67  Pulse:  78 82 76  Temp:      TempSrc: Oral     Resp: 20 17 18 15   Height:      Weight:      SpO2: 95% 95% 93% 93%    Wt Readings from Last 3 Encounters:  01/06/15 90.719 kg (200 lb)  01/02/15 91.627 kg (202 lb)  12/30/14 94.7 kg (208 lb 12.4 oz)   BP 159/70 mmHg  Pulse 71  Temp(Src) 97.4 F (36.3 C) (Oral)  Resp 19  Ht 5\' 10"  (1.778 m)  Wt 90.719 kg (200 lb)  BMI 28.70 kg/m2  SpO2 93%  General Appearance:    Alert, cooperative, no distress, appears stated age  Head:    Normocephalic, without obvious abnormality, atraumatic  Eyes:    PERRL, no scleral icterus      Nose:   Nares normal, septum midline, mucosa normal, no drainage   or sinus tenderness  Throat:    Lips, mucosa, and tongue normal; teeth and gums normal  Neck:   Supple, symmetrical, trachea midline, no adenopathy;       thyroid:  No enlargement/tenderness/nodules; no carotid   bruit or JVD  Back:     Symmetric, no curvature, ROM normal, no CVA tenderness  Lungs:     Clear to auscultation bilaterally, respirations unlabored  Chest wall:    No tenderness or deformity  Heart:    irregularly irregular no murmurs gallops rubs   Abdomen:     Soft, mild right upper quadrant tenderness. Bowel sounds present. Slightly protuberant. Right upper quadrant wound from drain draining bilious fluid which is saturated the dressing   Genitalia:    deferred   Rectal:    deferred   Extremities:   Extremities normal, atraumatic, no cyanosis or edema  Pulses:   2+ and symmetric all extremities  Skin:   Skin color, texture, turgor normal, no rashes or lesions  Lymph nodes:   Cervical, supraclavicular, and axillary nodes normal  Neurologic:   CNII-XII intact. Normal strength, sensation and reflexes      throughout    Psychiatric: Normal affect.        Labs on Admission:  Basic Metabolic Panel:  Recent Labs Lab 01/06/15 1420  NA 138  K 4.5  CL 98  CO2 24  GLUCOSE 139*  BUN 30*  CREATININE 1.90*  CALCIUM 9.8   Liver Function Tests:  Recent Labs Lab 01/06/15 1420  AST 35  ALT 27  ALKPHOS 70  BILITOT 0.7  PROT 9.3*  ALBUMIN 4.2    Recent Labs Lab 01/06/15 1420  LIPASE 24   No results for input(s): AMMONIA in the last 168 hours. CBC:  Recent Labs Lab 12/31/14 0550 01/02/15 1534 01/06/15 1420  WBC 9.6  --  15.9*  NEUTROABS  --   --  11.9*  HGB 10.0* 11.7* 13.4  HCT 30.7*  --  40.2  MCV 95.6  --  94.1  PLT 250  --  388   Cardiac Enzymes: No results for input(s): CKTOTAL, CKMB, CKMBINDEX, TROPONINI in the last 168 hours.  BNP (last 3 results) No results for input(s): BNP in the last 8760 hours.  ProBNP (last 3 results)  Recent Labs  01/23/14 0842 06/04/14 1056    PROBNP 111.0* 147.0*    CBG: No results for input(s): GLUCAP in the last 168 hours.  Radiological Exams on Admission: No results found.  EKG: Tracing reviewed. Atrial fibrillation Right bundle branch block  Assessment/Plan Principal Problem:   Acute/chronic Cholecystitis: Percutaneous drain was obstructed and could not be replaced. Dr. Lucia Gaskins will consult, but he discussed with the ED physician that would likely replaced drain. INR is 1.7. Will give vitamin K. Will give Unasyn for now. Dr. Anselm Pancoast is recommending repeat CAT scan of the abdomen and pelvis which I will order. Patient has chronic kidney disease and will hydrate. Nothing by mouth after midnight.  Observation status for now but may need to be converted to inpatient depending on surgery's plan. Active Problems:   COPD (chronic obstructive pulmonary disease) with emphysema, stable. Continue bronchodilators as needed   GERD:  Continue PPI.   Anticoagulant long-term use:  Reverse Coumadin for procedure. INR today is 1.7. Repeat tomorrow.   Hyperlipemia:  Continue statin   Atrial fibrillation, chronic, rate controlled   Coronary atherosclerosis of native coronary artery:  Stable   CKD (chronic kidney disease), stage III:  Monitor  Code Status: full DVT Prophylaxis:  SCDs Family Communication: wife at bedside Disposition Plan: observation to telemetry  Time spent: 60 minutes  [place] [email or web address], password [facility]

## 2015-01-06 NOTE — ED Notes (Signed)
Pt just received dinner tray.

## 2015-01-06 NOTE — ED Notes (Signed)
Ordered heart healthy dinner tray.  

## 2015-01-06 NOTE — ED Notes (Signed)
NOTIFIED DR. POLLINA FOR PATIENTS LAB RESULTS OFCG4+LACTIC ACID @15 :33PM .

## 2015-01-06 NOTE — ED Notes (Signed)
Pt called out to report drainage from drain site. Pt has a large amount of yellow drainage soiling through his dressing onto his bed. Dressing changed MD Polina notified.

## 2015-01-06 NOTE — ED Notes (Addendum)
Pt had come in to have a drain in his gallbladder fixed because it was blocked with stones. Had the tube removed and was unable to put back in. Since has had increasing pain. Pt is having pain in his right upper abd. Pt is diaphoretic in triage at this time.

## 2015-01-06 NOTE — ED Notes (Signed)
Ordered heart healthy dinner tray. Spoke with monica.

## 2015-01-06 NOTE — Procedures (Signed)
Post-Procedure Note  Pre-operative Diagnosis: Cholecystitis with non-draining cholecystostomy tube       Post-operative Diagnosis: Occluded cholecystostomy tube and unable to replace catheter   Indications: Non-functioning drain  Procedure Details:   Cholecystostomy tube was completely occluded.  Used multiple wires to unclog drain but unsuccessful.  Even tried to place a catheter along side the existing drain but unsuccessful.  The drain was completely removed and hoped to place a new catheter along old tract.  Unable to replace a drain along same tract.  Findings: The cholecystostomy tube was completely occluded.  Long segment occlusion with "rock hard" debris or sludge.  Unable to identify a tract to replace the drain.   Complications: None     Condition: Increased pain.  Plan: Discussed with Dr. Lucia Gaskins of General Surgery.  Patient is at risk for peritonitis from biliary leakage from removal of a non-mature gallbladder drain.  Due to patient's pain, I have contacted the ED and we will have him seen there for pain management but he will need to be admitted for management of his gallbladder and at least observation for 24 hours to exclude peritonitis.

## 2015-01-06 NOTE — ED Provider Notes (Signed)
CSN: [id number]     Arrival date & time 01/06/15  1401 History   First MD Initiated Contact with Patient 01/06/15 1457     Chief Complaint  Patient presents with  . Abdominal Pain     (Consider location/radiation/quality/duration/timing/severity/associated sxs/prior Treatment) HPI Comments: Patient presents for evaluation of increasing abdominal pain. Patient has had ongoing abdominal pain for more than a month. Patient has been diagnosed with cholecystitis at previous visits. He had percutaneous drain placed during hospitalization on March 26. Patient reports that the drain stopped draining 5 days ago. Since that time he has had progression of his pain symptoms. Has nausea but no vomiting. There has been no fever. Patient was seen in radiology as an outpatient today to recheck his percutaneous drain. The drain was blocked with a stone. The drain was removed but they were unable to replace it. Patient will require repeat hospitalization for further management.  Patient is a 74 y.o. male presenting with abdominal pain.  Abdominal Pain Associated symptoms: no fever     Past Medical History  Diagnosis Date  . Unspecified essential hypertension   . Anemia   . Asthma   . Hepatomegaly   . Esophageal reflux   . Other and unspecified coagulation defects   . Hiatal hernia   . Personal history of colonic polyps 05/29/2010    TUBULAR ADENOMA  . CAD (coronary artery disease)   . Hypothyroidism   . Gout   . Hyperlipidemia   . Gastric antral vascular ectasia 2013  . Atrial fibrillation   . History of blood transfusion ~ 2012    "blood count dropped; had to get 3 units"  . Arthritis     "hips; back" (07/09/2014)  . On home oxygen therapy     "2L at night" (07/09/2014)  . COPD (chronic obstructive pulmonary disease)    Past Surgical History  Procedure Laterality Date  . Umbilical hernia repair    . Coronary angioplasty with stent placement  ~ 2008; 07/08/2014    "1 + 3"  . Coronary artery  bypass graft  1998    CABG X4  . Hernia repair    . Umbilical hernia repair  1970's  . Left and right heart catheterization with coronary/graft angiogram N/A 07/09/2014    Procedure: LEFT AND RIGHT HEART CATHETERIZATION WITH Beatrix Fetters;  Surgeon: Sinclair Grooms, MD;  Location: [facility] CATH LAB;  Service: Cardiovascular;  Laterality: N/A;  . Colonoscopy  2013    Dr. Sharlett Iles: no polyps or evidence of active bleeding  . Esophagogastroduodenoscopy  2013    Dr. 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 Givens capsule study  2013    Dr. Sharlett Iles: AVM at 5 minutes beyond first duodenal image. If persistent IDA, bleeding, recommend enteroscopy with ablation    Family History  Problem Relation Age of Onset  . Leukemia Mother   . Kidney disease Mother     kidney removed   . Colon cancer Neg Hx   . Heart attack Brother   . Hypertension      family   . Cancer Father   . Heart disease Father   . Stomach cancer Sister   . Stomach cancer Sister   . Early death Brother    History  Substance Use Topics  . Smoking status: Former Smoker -- 1.00 packs/day for 42 years    Types: Cigarettes    Quit date: 12/16/1996  . Smokeless tobacco: Never Used  .  Alcohol Use: 0.6 oz/week    1 Cans of beer per week     Comment: 07/09/2014 "averages < 1 beer/wk", 11/26/14: denies ETOH    Review of Systems  Constitutional: Negative for fever.  Gastrointestinal: Positive for abdominal pain.  All other systems reviewed and are negative.     Allergies  Review of patient's allergies indicates no known allergies.  Home Medications   Prior to Admission medications   Medication Sig Start Date End Date Taking? Authorizing Provider  acetaminophen (TYLENOL) 500 MG tablet Take 1,000 mg by mouth every 6 (six) hours as needed. For pain.     Historical Provider, MD  albuterol (PROVENTIL HFA;VENTOLIN HFA) 108 (90 BASE) MCG/ACT inhaler Inhale 2 puffs into  the lungs every 6 (six) hours as needed. For shortness of breath. 11/22/14   Chipper Herb, MD  aspirin EC 81 MG tablet Take 1 tablet (81 mg total) by mouth every Monday, Wednesday, and Friday. 10/08/14   Belva Crome, MD  budesonide (PULMICORT) 0.25 MG/2ML nebulizer solution Take 0.25 mg by nebulization 2 (two) times daily.     Historical Provider, MD  Cholecalciferol (VITAMIN D3) 5000 UNITS CAPS Take 5,000 Units by mouth daily.     Historical Provider, MD  docusate sodium (COLACE) 100 MG capsule Take 1 capsule (100 mg total) by mouth 2 (two) times daily. 12/31/14   Geradine Girt, DO  esomeprazole (NEXIUM) 40 MG capsule Take twice daily on an empty stomach 12/20/14   Claretta Fraise, MD  ferrous sulfate 325 (65 FE) MG tablet Take 325 mg by mouth 2 (two) times daily with a meal.     Historical Provider, MD  furosemide (LASIX) 40 MG tablet Take 1 tablet (40 mg total) by mouth daily. 12/31/14   Geradine Girt, DO  hydrALAZINE (APRESOLINE) 25 MG tablet Take 1 tablet (25 mg total) by mouth 3 (three) times daily. 10/30/14   Belva Crome, MD  HYDROcodone-acetaminophen (NORCO/VICODIN) 5-325 MG per tablet Take 1 tablet by mouth every 6 (six) hours as needed for moderate pain. 12/31/14   Geradine Girt, DO  ipratropium-albuterol (DUONEB) 0.5-2.5 (3) MG/3ML SOLN Take 3 mLs by nebulization every 6 (six) hours as needed. For shortness of breath.     Historical Provider, MD  isosorbide-hydrALAZINE (BIDIL) 20-37.5 MG per tablet Take 1 tablet by mouth 3 (three) times daily. 06/25/14   Liliane Shi, PA-C  levofloxacin (LEVAQUIN) 750 MG tablet Take 1 tablet (750 mg total) by mouth every other day. 12/31/14   Geradine Girt, DO  levothyroxine (SYNTHROID, LEVOTHROID) 112 MCG tablet Take 1 tablet (112 mcg total) by mouth daily before breakfast. 12/12/14   Claretta Fraise, MD  lovastatin (MEVACOR) 40 MG tablet Take 1 tablet (40 mg total) by mouth at bedtime. 12/07/13   Belva Crome, MD  Multiple Vitamins-Minerals (MULTIVITAMINS  THER. W/MINERALS) TABS Take 1 tablet by mouth daily.      Historical Provider, MD  nitroGLYCERIN (NITROSTAT) 0.4 MG SL tablet Place 0.4 mg under the tongue every 5 (five) minutes as needed for chest pain.    Historical Provider, MD  oxymetazoline (AFRIN) 0.05 % nasal spray Place 1 spray into both nostrils 2 (two) times daily as needed for congestion. 12/31/14   Geradine Girt, DO  potassium chloride (K-DUR) 10 MEQ tablet Take 1 tablet (10 mEq total) by mouth daily. 10/09/14   Belva Crome, MD  sodium chloride (OCEAN) 0.65 % SOLN nasal spray Place 1 spray into both nostrils  as needed for congestion. 12/31/14   Geradine Girt, DO  warfarin (COUMADIN) 5 MG tablet Take 2.5-5 mg by mouth daily. Take 5 mg Monday, Tuesday, Wednesday, Friday, and Sat. Take 2.5 mg on Thursday and Sunday    Historical Provider, MD   BP 178/72 mmHg  Pulse 78  Temp(Src) 96.3 F (35.7 C) (Oral)  Resp 20  Ht 5\' 10"  (1.778 m)  Wt 200 lb (90.719 kg)  BMI 28.70 kg/m2  SpO2 95% Physical Exam  Constitutional: He is oriented to person, place, and time. He appears well-developed and well-nourished. No distress.  HENT:  Head: Normocephalic and atraumatic.  Right Ear: Hearing normal.  Left Ear: Hearing normal.  Nose: Nose normal.  Mouth/Throat: Oropharynx is clear and moist and mucous membranes are normal.  Eyes: Conjunctivae and EOM are normal. Pupils are equal, round, and reactive to light.  Neck: Normal range of motion. Neck supple.  Cardiovascular: Regular rhythm, S1 normal and S2 normal.  Exam reveals no gallop and no friction rub.   No murmur heard. Pulmonary/Chest: Effort normal and breath sounds normal. No respiratory distress. He exhibits no tenderness.  Abdominal: Soft. Normal appearance and bowel sounds are normal. There is no hepatosplenomegaly. There is tenderness in the right upper quadrant, epigastric area and left upper quadrant. There is no rebound, no guarding, no tenderness at McBurney's point and negative  Murphy's sign. No hernia.  Musculoskeletal: Normal range of motion.  Neurological: He is alert and oriented to person, place, and time. He has normal strength. No cranial nerve deficit or sensory deficit. Coordination normal. GCS eye subscore is 4. GCS verbal subscore is 5. GCS motor subscore is 6.  Skin: Skin is warm, dry and intact. No rash noted. No cyanosis.  Psychiatric: He has a normal mood and affect. His speech is normal and behavior is normal. Thought content normal.  Nursing note and vitals reviewed.   ED Course  Procedures (including critical care time) Labs Review Labs Reviewed  CBC WITH DIFFERENTIAL/PLATELET - Abnormal; Notable for the following:    WBC 15.9 (*)    Neutro Abs 11.9 (*)    Monocytes Absolute 1.4 (*)    All other components within normal limits  COMPREHENSIVE METABOLIC PANEL  LIPASE, BLOOD  URINALYSIS, ROUTINE W REFLEX MICROSCOPIC  PROTIME-INR  I-STAT TROPOININ, ED  I-STAT CG4 LACTIC ACID, ED    Imaging Review No results found.   EKG Interpretation   Date/Time:  Monday January 06 2015 14:16:42 EDT Ventricular Rate:  77 PR Interval:    QRS Duration: 150 QT Interval:  468 QTC Calculation: 529 R Axis:   -142 Text Interpretation:  Atrial fibrillation Right bundle branch block  Abnormal ECG No significant change since Confirmed by Bethene Hankinson  MD,  Chilton Sallade [phone]) on 01/06/2015 3:00:34 PM      MDM   Final diagnoses:  None   cholecystitis  Patient presents to the ER for evaluation of increasing abdominal pain. Patient has cholecystitis and had cholecystostomy tube placed on March 26. Patient has had cessation of drainage 5 days ago and today was evaluated by interventional radiology. They're unable to unblock the tube. The tube was removed, but a new tube was unable to be replaced. Patient was sent to the ER for pain management and admission.  Discussed with Dr. Lucia Gaskins. Plan would be to admit the patient overnight and have the drain replaced  tomorrow. He does not require any additional imaging at this time. Lab work is unremarkable other than leukocytosis, patient does  not have fever, no sign of sepsis.    Orpah Greek, MD 01/06/15 1540

## 2015-01-07 ENCOUNTER — Observation Stay (HOSPITAL_COMMUNITY): Payer: Medicare Other

## 2015-01-07 DIAGNOSIS — K8010 Calculus of gallbladder with chronic cholecystitis without obstruction: Secondary | ICD-10-CM | POA: Diagnosis not present

## 2015-01-07 DIAGNOSIS — I482 Chronic atrial fibrillation: Secondary | ICD-10-CM

## 2015-01-07 DIAGNOSIS — T83010A Breakdown (mechanical) of cystostomy catheter, initial encounter: Secondary | ICD-10-CM | POA: Diagnosis present

## 2015-01-07 DIAGNOSIS — Y833 Surgical operation with formation of external stoma as the cause of abnormal reaction of the patient, or of later complication, without mention of misadventure at the time of the procedure: Secondary | ICD-10-CM | POA: Diagnosis present

## 2015-01-07 DIAGNOSIS — T85628A Displacement of other specified internal prosthetic devices, implants and grafts, initial encounter: Secondary | ICD-10-CM | POA: Diagnosis not present

## 2015-01-07 DIAGNOSIS — K819 Cholecystitis, unspecified: Secondary | ICD-10-CM | POA: Diagnosis not present

## 2015-01-07 DIAGNOSIS — N183 Chronic kidney disease, stage 3 (moderate): Secondary | ICD-10-CM

## 2015-01-07 LAB — COMPREHENSIVE METABOLIC PANEL
ALBUMIN: 3.3 g/dL — AB (ref 3.5–5.2)
ALK PHOS: 60 U/L (ref 39–117)
ALT: 22 U/L (ref 0–53)
AST: 28 U/L (ref 0–37)
Anion gap: 9 (ref 5–15)
BUN: 28 mg/dL — ABNORMAL HIGH (ref 6–23)
CO2: 29 mmol/L (ref 19–32)
Calcium: 9.3 mg/dL (ref 8.4–10.5)
Chloride: 96 mmol/L (ref 96–112)
Creatinine, Ser: 1.68 mg/dL — ABNORMAL HIGH (ref 0.50–1.35)
GFR calc non Af Amer: 38 mL/min — ABNORMAL LOW (ref >90)
GFR, EST AFRICAN AMERICAN: 44 mL/min — AB (ref >90)
GLUCOSE: 90 mg/dL (ref 70–99)
POTASSIUM: 4.7 mmol/L (ref 3.5–5.1)
SODIUM: 134 mmol/L — AB (ref 135–145)
TOTAL PROTEIN: 6.7 g/dL (ref 6.0–8.3)
Total Bilirubin: 0.7 mg/dL (ref 0.3–1.2)

## 2015-01-07 LAB — URINE CULTURE
COLONY COUNT: NO GROWTH
CULTURE: NO GROWTH

## 2015-01-07 LAB — CBC WITH DIFFERENTIAL/PLATELET
Basophils Absolute: 0.1 10*3/uL (ref 0.0–0.1)
Basophils Relative: 1 % (ref 0–1)
Eosinophils Absolute: 0.7 10*3/uL (ref 0.0–0.7)
Eosinophils Relative: 5 % (ref 0–5)
HEMATOCRIT: 37 % — AB (ref 39.0–52.0)
HEMOGLOBIN: 11.9 g/dL — AB (ref 13.0–17.0)
LYMPHS PCT: 9 % — AB (ref 12–46)
Lymphs Abs: 1.2 10*3/uL (ref 0.7–4.0)
MCH: 30.4 pg (ref 26.0–34.0)
MCHC: 32.2 g/dL (ref 30.0–36.0)
MCV: 94.6 fL (ref 78.0–100.0)
MONO ABS: 1.5 10*3/uL — AB (ref 0.1–1.0)
MONOS PCT: 12 % (ref 3–12)
NEUTROS ABS: 9.5 10*3/uL — AB (ref 1.7–7.7)
Neutrophils Relative %: 73 % (ref 43–77)
Platelets: 348 10*3/uL (ref 150–400)
RBC: 3.91 MIL/uL — ABNORMAL LOW (ref 4.22–5.81)
RDW: 14.1 % (ref 11.5–15.5)
WBC: 12.9 10*3/uL — AB (ref 4.0–10.5)

## 2015-01-07 LAB — PROTIME-INR
INR: 1.81 — ABNORMAL HIGH (ref 0.00–1.49)
Prothrombin Time: 21.2 seconds — ABNORMAL HIGH (ref 11.6–15.2)

## 2015-01-07 MED ORDER — FENTANYL CITRATE 0.05 MG/ML IJ SOLN
INTRAMUSCULAR | Status: AC
  Filled 2015-01-07: qty 2

## 2015-01-07 MED ORDER — CEFTRIAXONE SODIUM IN DEXTROSE 40 MG/ML IV SOLN
2.0000 g | INTRAVENOUS | Status: DC
  Administered 2015-01-07 – 2015-01-08 (×2): 2 g via INTRAVENOUS
  Filled 2015-01-07 (×2): qty 50

## 2015-01-07 MED ORDER — FENTANYL CITRATE 0.05 MG/ML IJ SOLN
INTRAMUSCULAR | Status: AC | PRN
  Administered 2015-01-07 (×2): 25 ug via INTRAVENOUS
  Administered 2015-01-07 (×3): 50 ug via INTRAVENOUS
  Administered 2015-01-07 (×2): 25 ug via INTRAVENOUS

## 2015-01-07 MED ORDER — LIDOCAINE-EPINEPHRINE 1 %-1:100000 IJ SOLN
INTRAMUSCULAR | Status: AC
  Filled 2015-01-07: qty 1

## 2015-01-07 MED ORDER — MIDAZOLAM HCL 2 MG/2ML IJ SOLN
INTRAMUSCULAR | Status: AC
  Filled 2015-01-07: qty 4

## 2015-01-07 MED ORDER — MIDAZOLAM HCL 2 MG/2ML IJ SOLN
INTRAMUSCULAR | Status: AC | PRN
  Administered 2015-01-07: 1 mg via INTRAVENOUS
  Administered 2015-01-07: 0.5 mg via INTRAVENOUS
  Administered 2015-01-07 (×2): 1 mg via INTRAVENOUS
  Administered 2015-01-07: 0.5 mg via INTRAVENOUS
  Administered 2015-01-07: 1 mg via INTRAVENOUS

## 2015-01-07 MED ORDER — MIDAZOLAM HCL 2 MG/2ML IJ SOLN
INTRAMUSCULAR | Status: AC
  Filled 2015-01-07: qty 2

## 2015-01-07 MED ORDER — FENTANYL CITRATE 0.05 MG/ML IJ SOLN
INTRAMUSCULAR | Status: AC
  Filled 2015-01-07: qty 4

## 2015-01-07 NOTE — Consult Note (Addendum)
[facility] Progress Note     Subjective: 74 y/o white male was recently admitted for severe cholecystitis and due to co-morbidities a perc chole drain was inserted and cholecystectomy delayed.  Patient was re-admitted after attempts at unclogging his perc chole tube was made by Dr. Anselm Pancoast yesterday.  He was unsuccessful so a new drain was attempted.  This was also unsuccessful.  He discussed this with Dr. Lucia Gaskins last night.  We recommended that he be admitted for monitoring of peritonitis/bile leak.  Pt has no pain this am, but yesterday he had a lot of pain with attempts at manipulating a new drain in.  No N/V, no fever/chills, no diarrhea.  Last had a normal BM yesterday.  WBC is 12.9 this am.  His daughter is at bedside.    Objective: Vital signs in last 24 hours: Temp:  [96.3 F (35.7 C)-98.1 F (36.7 C)] 97.9 F (36.6 C) (04/05 0534) Pulse Rate:  [63-83] 83 (04/05 0534) Resp:  [12-20] 16 (04/05 0534) BP: (145-189)/(56-95) 145/56 mmHg (04/05 0534) SpO2:  [91 %-97 %] 97 % (04/05 0534) Weight:  [90.719 kg (200 lb)-92.67 kg (204 lb 4.8 oz)] 92.67 kg (204 lb 4.8 oz) (04/05 0534) Last BM Date: 01/06/15  Intake/Output from previous day: 04/04 0701 - 04/05 0700 In: 295.7 [I.V.:95.7; IV Piggyback:200] Out: 420 [Urine:420] Intake/Output this shift:    PE: Gen:  Alert, NAD, pleasant Abd: Soft, ND, NT, +BS, no HSM, RUQ with perc chole hole which is draining minimal amounts.  Dressing is minimally saturated with sanguinous drainage.   Lab Results:   Recent Labs  01/06/15 1420 01/07/15 0539  WBC 15.9* 12.9*  HGB 13.4 11.9*  HCT 40.2 37.0*  PLT 388 348   BMET  Recent Labs  01/06/15 1420 01/07/15 0539  NA 138 134*  K 4.5 4.7  CL 98 96  CO2 24 29  GLUCOSE 139* 90  BUN 30* 28*  CREATININE 1.90* 1.68*  CALCIUM 9.8 9.3   PT/INR  Recent Labs  01/06/15 1528 01/07/15 0539  LABPROT 20.8* 21.2*  INR 1.78* 1.81*   CMP     Component Value Date/Time   NA  134* 01/07/2015 0539   NA 139 12/23/2014 1124   K 4.7 01/07/2015 0539   CL 96 01/07/2015 0539   CO2 29 01/07/2015 0539   GLUCOSE 90 01/07/2015 0539   GLUCOSE 96 12/23/2014 1124   BUN 28* 01/07/2015 0539   BUN 34* 12/23/2014 1124   CREATININE 1.68* 01/07/2015 0539   CALCIUM 9.3 01/07/2015 0539   PROT 6.7 01/07/2015 0539   PROT 6.5 12/23/2014 1124   ALBUMIN 3.3* 01/07/2015 0539   AST 28 01/07/2015 0539   ALT 22 01/07/2015 0539   ALKPHOS 60 01/07/2015 0539   BILITOT 0.7 01/07/2015 0539   BILITOT 0.2 12/23/2014 1124   GFRNONAA 38* 01/07/2015 0539   GFRAA 44* 01/07/2015 0539   Lipase     Component Value Date/Time   LIPASE 24 01/06/2015 1420       Studies/Results: Ct Abdomen Pelvis Wo Contrast  01/06/2015   CLINICAL DATA:  Cholecystitis.  EXAM: CT ABDOMEN AND PELVIS WITHOUT CONTRAST  TECHNIQUE: Multidetector CT imaging of the abdomen and pelvis was performed following the standard protocol without IV contrast.  COMPARISON:  CT scan of November 26, 2014.  FINDINGS: Mild multilevel degenerative disc disease is noted in the lumbar spine. No significant abnormality is noted in visualized lung bases.  The spleen and pancreas appear normal. Adrenal glands and kidneys  appear normal. Atherosclerotic calcifications of abdominal aorta and iliac arteries are noted without aneurysm formation. Mild prostatic hypertrophy is noted. Urinary bladder appears normal. There is no evidence of bowel obstruction. No renal or ureteral calculi are noted. No hydronephrosis or renal obstruction is noted.  Gallbladder wall thickening is noted with surrounding fluid suggesting cholecystitis. Fluid is also seen along the lateral margin of the right hepatic lobe. Cholecystostomy drainage catheter is not visualized and presumably has been removed.  IMPRESSION: Gallbladder wall thickening with surrounding fluid is noted suggesting cholecystitis. Fluid is also noted along the lateral margin of the right hepatic lobe.  Cholecystostomy drainage catheter is not visualized and presumably has been removed.   Electronically Signed   By: Marijo Conception, M.D.   On: 01/06/2015 20:35   Ir Sinus/fist Tube Chk-non Gi  01/06/2015   CLINICAL DATA:  74 year old with history of cholecystitis and recently placed percutaneous cholecystostomy tube. The catheter is no longer draining and unable to flush the catheter. Patient presents for catheter evaluation.  EXAM: INJECTION AND ATTEMPTED EXCHANGE OF PERCUTANEOUS CHOLECYSTOSTOMY TUBE.  Physician: Stephan Minister. Henn, MD  FLUOROSCOPY TIME:  9 minutes and 6 seconds, 259.4 mGy  MEDICATIONS AND MEDICAL HISTORY: None  ANESTHESIA/SEDATION: Moderate sedation time: None  CONTRAST:  10 mL Omnipaque-300  PROCEDURE: The procedure was explained to the patient. The risks and benefits of the procedure were discussed and the patient's questions were addressed. Informed consent was obtained from the patient. The tube would not flush even with a 3 mL syringe. Unable to inject contrast through the tube. The existing catheter and surrounding skin were prepped and draped in sterile fashion. Maximal barrier sterile technique was utilized including caps, mask, sterile gowns, sterile gloves, sterile drape, hand hygiene and skin antiseptic. The skin around the catheter was anesthetized with 1% lidocaine. The suture was removed. Multiple different wires were advanced through the catheter. There was an abrupt obstruction in the midportion of the catheter. Despite using multiple wires and different techniques, wire could never be successfully advanced through the catheter. Multiple attempts were made to place a second catheter along the existing catheter. These attempts to place a "parallel catheter" were unsuccessful. As a result, the catheter was slowly pulled out of the patient until the point of obstruction was exposed. The catheter was cut at the point of obstruction but, unfortunately, the remaining catheter was also  completely obstructed. Therefore, a wire could be advanced through this catheter. At this point, there was no option but to completely remove the catheter. A 5 French Kumpe catheter was advanced through the track and multiple attempts were made to cannulate the old tract. These attempts were unsuccessful. In addition, the patient was very uncomfortable with any manipulation. Bandage was placed at the site.  FINDINGS: The percutaneous cholecystostomy tube appeared to appropriately positioned based on fluoroscopy. The mid and distal aspect of the catheter were completely obstructed. After the catheter was removed, rock-hard material was identified within the catheter. Unable to replace a catheter through the old tract. Dark bile was draining from the site.  Estimated blood loss: None  COMPLICATIONS: None  IMPRESSION: Complete obstruction of the percutaneous cholecystostomy tube. The cholecystostomy tube was removed but a new tube could not be placed through the old tract.  Following the procedure, the patient was complaining of increased abdominal pain. Patient was at risk for bile leakage and peritonitis because this was a non-mature cholecystostomy tube tract. These findings were discussed with Dr. Lucia Gaskins of General Surgery. Patient  was transferred to the emergency department and later admitted to the hospital.   Electronically Signed   By: Markus Daft M.D.   On: 01/06/2015 17:50    Anti-infectives: Anti-infectives    Start     Dose/Rate Route Frequency Ordered Stop   01/06/15 2100  Ampicillin-Sulbactam (UNASYN) 3 g in sodium chloride 0.9 % 100 mL IVPB     3 g 100 mL/hr over 60 Minutes Intravenous Every 6 hours 01/06/15 2056         Assessment/Plan 1. Chronic/acute cholecystitis/cholelithiasis  -Had IR perc drain on 12/28/14 by Dr. Earleen Newport.  The IR drain became clogged and was removed by IR's Dr. Anselm Pancoast on 01/06/2015.Marland Kitchen  Unfortunately, the drain could not be replaced.  He is at risk for peritonitis from  biliary leak.   -Recommend replacing IR perc drain, IR may be able to attempt today -Monitor WBC -Ambulate and IS -SCD's and hold blood thinners for procedure INR is 1.81 -NPO -Would switch to Rocephin for cholecystitis since Unasyn has a lot of resistence 2. ASCAD with hx of CABG; Cardiac cath 07/2014 showing occluded SVG to distal LAD, high grade proximal 95% SVG to RCA, severe native CAD with total occlusion of the mid RCA, total occlusion of prox LAD and total occlusion of the prox LCX, patent LIMA to LAD with 50-70% ostial/proximal LIMA and underwent BMS of the SVG to RCA. BMS was done due to history GI bleeding in the past with chronic anemia on anticoagulation therapy for afib.   3. 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 Chronic atrial fibrillation rate controlled on chronic Coumadin 5. Stage III chronic renal disease 6. COPD with chronic dyspnea 7. HTN 8. Hx of hypertension 9. GERD 10. Hyperlipidemia     DORT, MEGAN 01/07/2015, 7:56 AM Pager: [phone]  Agree with above.  Unfortunately, Dr. Pascal Lux could not get the wire back in the gall bladder.  For now, will plan to see in our office in 2 or 3 weeks.  Dr. Hulen Skains is following the patient.  Plan interval cholecystectomy after that.  If patient gets sick, may be forced to do something earlier.  Both his wife and daughter are in the room and I answered questions about his follow up.  He is on clear liquids tonight.  His diet can be advanced tomorrow.  And if doing well, he can go home.  Alphonsa Overall, MD, [facility] Pager: [phone] Office phone:  [phone]

## 2015-01-07 NOTE — Sedation Documentation (Signed)
Patient is resting comfortably. 

## 2015-01-07 NOTE — Sedation Documentation (Signed)
Pt awakening intermittently moaning.

## 2015-01-07 NOTE — Progress Notes (Signed)
Patient present back on floor from radiology. Patient stable, alert and oriented. Report translated to Ria Comment, RN who then passed report to myself.

## 2015-01-07 NOTE — Sedation Documentation (Signed)
Attempted to call report but no one available at this time. Ria Comment to return call.

## 2015-01-07 NOTE — Sedation Documentation (Signed)
Pt moaning and grimacing.

## 2015-01-07 NOTE — Progress Notes (Signed)
PROGRESS NOTE  [name] [name] [id number] DOB: 12-31-1940 DOA: 01/06/2015 PCP: [name], MD  Assessment/Plan: Acute/chronic Cholecystitis:  Percutaneous drain was obstructed and could not be replaced.  -IR tried for new drain 4/5 unsuccessful -rocephin per surgery   COPD (chronic obstructive pulmonary disease) with emphysema, stable. Continue bronchodilators as needed  GERD: Continue PPI.  Anticoagulant long-term use: Reverse Coumadin for procedure. Trend INR.  Hyperlipemia: Continue statin  Atrial fibrillation, chronic, rate controlled  Coronary atherosclerosis of native coronary artery: Stable  CKD (chronic kidney disease), stage III: Monitor  Code Status:full Family Communication: family at bedside Disposition Plan:    Consultants:  IR  surgery  Procedures:      HPI/Subjective: hungry   Objective: Filed Vitals:   01/07/15 0534  BP: 145/56  Pulse: 83  Temp: 97.9 F (36.6 C)  Resp: 16    Intake/Output Summary (Last 24 hours) at 01/07/15 1147 Last data filed at 01/07/15 0900  Gross per 24 hour  Intake 295.67 ml  Output    420 ml  Net -124.33 ml   Filed Weights   01/06/15 1413 01/06/15 2059 01/07/15 0534  Weight: 90.719 kg (200 lb) 91.354 kg (201 lb 6.4 oz) 92.67 kg (204 lb 4.8 oz)    Exam:   General:  A+Ox3, NAD  Cardiovascular: rrr  Respiratory: clear  Abdomen: non-tender  Musculoskeletal: no edema   Data Reviewed: Basic Metabolic Panel:  Recent Labs Lab 01/06/15 1420 01/07/15 0539  NA 138 134*  K 4.5 4.7  CL 98 96  CO2 24 29  GLUCOSE 139* 90  BUN 30* 28*  CREATININE 1.90* 1.68*  CALCIUM 9.8 9.3   Liver Function Tests:  Recent Labs Lab 01/06/15 1420 01/07/15 0539  AST 35 28  ALT 27 22  ALKPHOS 70 60  BILITOT 0.7 0.7  PROT 9.3* 6.7  ALBUMIN 4.2 3.3*    Recent Labs Lab 01/06/15 1420  LIPASE 24   No results for input(s): AMMONIA in the last 168 hours. CBC:  Recent Labs Lab 01/02/15 1534  01/06/15 1420 01/07/15 0539  WBC  --  15.9* 12.9*  NEUTROABS  --  11.9* 9.5*  HGB 11.7* 13.4 11.9*  HCT  --  40.2 37.0*  MCV  --  94.1 94.6  PLT  --  388 348   Cardiac Enzymes: No results for input(s): CKTOTAL, CKMB, CKMBINDEX, TROPONINI in the last 168 hours. BNP (last 3 results) No results for input(s): BNP in the last 8760 hours.  ProBNP (last 3 results)  Recent Labs  01/23/14 0842 06/04/14 1056  PROBNP 111.0* 147.0*    CBG: No results for input(s): GLUCAP in the last 168 hours.  Recent Results (from the past 240 hour(s))  Culture, routine-abscess     Status: None   Collection Time: 12/28/14  2:41 PM  Result Value Ref Range Status   Specimen Description GALL BLADDER  Final   Special Requests NONE  Final   Gram Stain   Final    NO WBC SEEN NO SQUAMOUS EPITHELIAL CELLS SEEN NO ORGANISMS SEEN Performed at [facility]    Culture   Final    NO GROWTH 3 DAYS Performed at [facility]    Report Status 01/01/2015 FINAL  Final     Studies: Ct Abdomen Pelvis Wo Contrast  01/06/2015   CLINICAL DATA:  Cholecystitis.  EXAM: CT ABDOMEN AND PELVIS WITHOUT CONTRAST  TECHNIQUE: Multidetector CT imaging of the abdomen and pelvis was performed following the standard protocol without IV  contrast.  COMPARISON:  CT scan of November 26, 2014.  FINDINGS: Mild multilevel degenerative disc disease is noted in the lumbar spine. No significant abnormality is noted in visualized lung bases.  The spleen and pancreas appear normal. Adrenal glands and kidneys appear normal. Atherosclerotic calcifications of abdominal aorta and iliac arteries are noted without aneurysm formation. Mild prostatic hypertrophy is noted. Urinary bladder appears normal. There is no evidence of bowel obstruction. No renal or ureteral calculi are noted. No hydronephrosis or renal obstruction is noted.  Gallbladder wall thickening is noted with surrounding fluid suggesting cholecystitis. Fluid is also seen  along the lateral margin of the right hepatic lobe. Cholecystostomy drainage catheter is not visualized and presumably has been removed.  IMPRESSION: Gallbladder wall thickening with surrounding fluid is noted suggesting cholecystitis. Fluid is also noted along the lateral margin of the right hepatic lobe. Cholecystostomy drainage catheter is not visualized and presumably has been removed.   Electronically Signed   By: Marijo Conception, M.D.   On: 01/06/2015 20:35   Ir Sinus/fist Tube Chk-non Gi  01/06/2015   CLINICAL DATA:  74 year old with history of cholecystitis and recently placed percutaneous cholecystostomy tube. The catheter is no longer draining and unable to flush the catheter. Patient presents for catheter evaluation.  EXAM: INJECTION AND ATTEMPTED EXCHANGE OF PERCUTANEOUS CHOLECYSTOSTOMY TUBE.  Physician: Stephan Minister. Henn, MD  FLUOROSCOPY TIME:  9 minutes and 6 seconds, 259.4 mGy  MEDICATIONS AND MEDICAL HISTORY: None  ANESTHESIA/SEDATION: Moderate sedation time: None  CONTRAST:  10 mL Omnipaque-300  PROCEDURE: The procedure was explained to the patient. The risks and benefits of the procedure were discussed and the patient's questions were addressed. Informed consent was obtained from the patient. The tube would not flush even with a 3 mL syringe. Unable to inject contrast through the tube. The existing catheter and surrounding skin were prepped and draped in sterile fashion. Maximal barrier sterile technique was utilized including caps, mask, sterile gowns, sterile gloves, sterile drape, hand hygiene and skin antiseptic. The skin around the catheter was anesthetized with 1% lidocaine. The suture was removed. Multiple different wires were advanced through the catheter. There was an abrupt obstruction in the midportion of the catheter. Despite using multiple wires and different techniques, wire could never be successfully advanced through the catheter. Multiple attempts were made to place a second catheter  along the existing catheter. These attempts to place a "parallel catheter" were unsuccessful. As a result, the catheter was slowly pulled out of the patient until the point of obstruction was exposed. The catheter was cut at the point of obstruction but, unfortunately, the remaining catheter was also completely obstructed. Therefore, a wire could be advanced through this catheter. At this point, there was no option but to completely remove the catheter. A 5 French Kumpe catheter was advanced through the track and multiple attempts were made to cannulate the old tract. These attempts were unsuccessful. In addition, the patient was very uncomfortable with any manipulation. Bandage was placed at the site.  FINDINGS: The percutaneous cholecystostomy tube appeared to appropriately positioned based on fluoroscopy. The mid and distal aspect of the catheter were completely obstructed. After the catheter was removed, rock-hard material was identified within the catheter. Unable to replace a catheter through the old tract. Dark bile was draining from the site.  Estimated blood loss: None  COMPLICATIONS: None  IMPRESSION: Complete obstruction of the percutaneous cholecystostomy tube. The cholecystostomy tube was removed but a new tube could not be placed through  the old tract.  Following the procedure, the patient was complaining of increased abdominal pain. Patient was at risk for bile leakage and peritonitis because this was a non-mature cholecystostomy tube tract. These findings were discussed with Dr. Lucia Gaskins of General Surgery. Patient was transferred to the emergency department and later admitted to the hospital.   Electronically Signed   By: Markus Daft M.D.   On: 01/06/2015 17:50    Scheduled Meds: . budesonide  0.25 mg Nebulization BID  . cefTRIAXone (ROCEPHIN)  IV  2 g Intravenous Q24H  . docusate sodium  100 mg Oral BID  . isosorbide-hydrALAZINE  1 tablet Oral TID  . levothyroxine  100 mcg Oral QAC breakfast    . pantoprazole  40 mg Oral BID  . pravastatin  40 mg Oral q1800   Continuous Infusions: . sodium chloride 10 mL/hr at 01/06/15 2058   Antibiotics Given (last 72 hours)    Date/Time Action Medication Dose Rate   01/06/15 2129 Given   Ampicillin-Sulbactam (UNASYN) 3 g in sodium chloride 0.9 % 100 mL IVPB 3 g 100 mL/hr   01/07/15 0254 Given   Ampicillin-Sulbactam (UNASYN) 3 g in sodium chloride 0.9 % 100 mL IVPB 3 g 100 mL/hr   01/07/15 0801 Given   Ampicillin-Sulbactam (UNASYN) 3 g in sodium chloride 0.9 % 100 mL IVPB 3 g 100 mL/hr   01/07/15 1113 Given   cefTRIAXone (ROCEPHIN) 2 g in dextrose 5 % 50 mL IVPB - Premix 2 g 100 mL/hr      Principal Problem:   Cholecystitis Active Problems:   COPD (chronic obstructive pulmonary disease) with emphysema   GERD   Anticoagulant long-term use   Hyperlipemia   Atrial fibrillation, chronic   Coronary atherosclerosis of native coronary artery   CKD (chronic kidney disease), stage III    Time spent: 25 min    VANN, JESSICA  [facility] Pager [phone]. If 7PM-7AM, please contact night-coverage at [email or web address], password [facility] 01/07/2015, 11:47 AM

## 2015-01-07 NOTE — Sedation Documentation (Signed)
Pt awake and talking w/ no c/o pain.

## 2015-01-07 NOTE — Sedation Documentation (Signed)
Pt awake and talking. Voiced he is nervous about procedure.

## 2015-01-07 NOTE — Consult Note (Signed)
Chief Complaint: Chief Complaint  Patient presents with  . Abdominal Pain  clogged cholecystostomy drain  Referring Physician(s): [facility]  History of Present Illness: [name] [name] is a 74 y.o. male   Pt with percutaneous cholecystostomy drain placed 3/26 Developed worsening abd pain and unable to flush in last 4-5 days IR evaluated drain yesterday Had to remove drain secondary clogged with stones per Dr Anselm Pancoast note He was unable to exchange/replace Now request for new drain placement per CCS Dr Pascal Lux has reviewed imaging and approves procedure I have seen and examined pt'  INR 1.8 today Off coumadin now   Past Medical History  Diagnosis Date  . Unspecified essential hypertension   . Anemia   . Asthma   . Hepatomegaly   . Esophageal reflux   . Other and unspecified coagulation defects   . Hiatal hernia   . Personal history of colonic polyps 05/29/2010    TUBULAR ADENOMA  . CAD (coronary artery disease)   . Hypothyroidism   . Gout   . Hyperlipidemia   . Gastric antral vascular ectasia 2013  . Atrial fibrillation   . History of blood transfusion ~ 2012    "blood count dropped; had to get 3 units"  . Arthritis     "hips; back" (07/09/2014)  . On home oxygen therapy     "2L at night" (07/09/2014)  . COPD (chronic obstructive pulmonary disease)     Past Surgical History  Procedure Laterality Date  . Umbilical hernia repair    . Coronary angioplasty with stent placement  ~ 2008; 07/08/2014    "1 + 3"  . 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 Hernia repair    . Umbilical hernia repair  1970's  . Left and right heart catheterization with coronary/graft angiogram N/A 07/09/2014    Procedure: LEFT AND RIGHT HEART CATHETERIZATION WITH Beatrix Fetters;  Surgeon: Sinclair Grooms, MD;  Location: [facility] CATH LAB;  Service: Cardiovascular;  Laterality: N/A;  . Colonoscopy  2013    Dr. Sharlett Iles: no polyps or evidence of active bleeding  .  Esophagogastroduodenoscopy  2013    Dr. 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 Givens capsule study  2013    Dr. Sharlett Iles: AVM at 5 minutes beyond first duodenal image. If persistent IDA, bleeding, recommend enteroscopy with ablation     Allergies: Review of patient's allergies indicates no known allergies.  Medications: Prior to Admission medications   Medication Sig Start Date End Date Taking? Authorizing Provider  acetaminophen (TYLENOL) 500 MG tablet Take 1,000 mg by mouth every 6 (six) hours as needed. For pain.    Yes Historical Provider, MD  albuterol (PROVENTIL HFA;VENTOLIN HFA) 108 (90 BASE) MCG/ACT inhaler Inhale 2 puffs into the lungs every 6 (six) hours as needed. For shortness of breath. 11/22/14  Yes Chipper Herb, MD  aspirin EC 81 MG tablet Take 1 tablet (81 mg total) by mouth every Monday, Wednesday, and Friday. 10/08/14  Yes Belva Crome, MD  budesonide (PULMICORT) 0.25 MG/2ML nebulizer solution Take 0.25 mg by nebulization 2 (two) times daily.    Yes Historical Provider, MD  Cholecalciferol (VITAMIN D3) 5000 UNITS CAPS Take 5,000 Units by mouth daily.    Yes Historical Provider, MD  docusate sodium (COLACE) 100 MG capsule Take 1 capsule (100 mg total) by mouth 2 (two) times daily. 12/31/14  Yes Geradine Girt, DO  esomeprazole (Levan) 40  MG capsule Take twice daily on an empty stomach 12/20/14  Yes Claretta Fraise, MD  ferrous sulfate 325 (65 FE) MG tablet Take 325 mg by mouth 2 (two) times daily with a meal.    Yes Historical Provider, MD  furosemide (LASIX) 40 MG tablet Take 1 tablet (40 mg total) by mouth daily. 12/31/14  Yes Geradine Girt, DO  hydrALAZINE (APRESOLINE) 25 MG tablet Take 1 tablet (25 mg total) by mouth 3 (three) times daily. 10/30/14  Yes Belva Crome, MD  HYDROcodone-acetaminophen (NORCO/VICODIN) 5-325 MG per tablet Take 1 tablet by mouth every 6 (six) hours as needed for moderate pain. 12/31/14  Yes Jessica U  Vann, DO  ipratropium-albuterol (DUONEB) 0.5-2.5 (3) MG/3ML SOLN Take 3 mLs by nebulization every 6 (six) hours as needed. For shortness of breath.    Yes Historical Provider, MD  isosorbide-hydrALAZINE (BIDIL) 20-37.5 MG per tablet Take 1 tablet by mouth 3 (three) times daily. 06/25/14  Yes Scott T Kathlen Mody, PA-C  levofloxacin (LEVAQUIN) 750 MG tablet Take 1 tablet (750 mg total) by mouth every other day. 12/31/14  Yes Geradine Girt, DO  levothyroxine (SYNTHROID, LEVOTHROID) 112 MCG tablet Take 1 tablet (112 mcg total) by mouth daily before breakfast. 12/12/14  Yes Claretta Fraise, MD  lovastatin (MEVACOR) 40 MG tablet Take 1 tablet (40 mg total) by mouth at bedtime. 12/07/13  Yes Belva Crome, MD  Multiple Vitamins-Minerals (MULTIVITAMINS THER. W/MINERALS) TABS Take 1 tablet by mouth daily.     Yes Historical Provider, MD  oxymetazoline (AFRIN) 0.05 % nasal spray Place 1 spray into both nostrils 2 (two) times daily as needed for congestion. 12/31/14  Yes Geradine Girt, DO  potassium chloride (K-DUR) 10 MEQ tablet Take 1 tablet (10 mEq total) by mouth daily. 10/09/14  Yes Belva Crome, MD  sodium chloride (OCEAN) 0.65 % SOLN nasal spray Place 1 spray into both nostrils as needed for congestion. 12/31/14  Yes Geradine Girt, DO  warfarin (COUMADIN) 5 MG tablet Take 2.5-5 mg by mouth daily. Take 5 mg Monday, Tuesday, Wednesday, Friday, and Sat. Take 2.5 mg on Thursday and Sunday   Yes Historical Provider, MD  nitroGLYCERIN (NITROSTAT) 0.4 MG SL tablet Place 0.4 mg under the tongue every 5 (five) minutes as needed for chest pain.    Historical Provider, MD     Family History  Problem Relation Age of Onset  . Leukemia Mother   . Kidney disease Mother     kidney removed   . Colon cancer Neg Hx   . Heart attack Brother   . Hypertension      family   . Cancer Father   . Heart disease Father   . Stomach cancer Sister   . Stomach cancer Sister   . Early death Brother     History   Social History  .  Marital Status: Married    Spouse Name: N/A  . Number of Children: N/A  . Years of Education: N/A   Occupational History  . retired    Social History Main Topics  . Smoking status: Former Smoker -- 1.00 packs/day for 42 years    Types: Cigarettes    Quit date: 12/16/1996  . Smokeless tobacco: Never Used  . Alcohol Use: 0.6 oz/week    1 Cans of beer per week     Comment: 07/09/2014 "averages < 1 beer/wk", 11/26/14: denies ETOH  . Drug Use: No  . Sexual Activity: No   Other Topics Concern  .  None   Social History Narrative    Review of Systems: A 12 point ROS discussed and pertinent positives are indicated in the HPI above.  All other systems are negative.  Review of Systems  Constitutional: Negative for activity change.  Respiratory: Negative for cough and shortness of breath.   Cardiovascular: Negative for chest pain.  Gastrointestinal: Positive for nausea and abdominal pain.  Musculoskeletal: Negative for back pain.  Neurological: Negative for weakness.  Psychiatric/Behavioral: Negative for behavioral problems and confusion.    Vital Signs: BP 145/56 mmHg  Pulse 83  Temp(Src) 97.9 F (36.6 C) (Oral)  Resp 16  Ht 5' 10.5" (1.791 m)  Wt 92.67 kg (204 lb 4.8 oz)  BMI 28.89 kg/m2  SpO2 97%  Physical Exam  Constitutional: He is oriented to person, place, and time. He appears well-nourished.  Cardiovascular: Normal rate and regular rhythm.   Pulmonary/Chest: Effort normal and breath sounds normal. He has no wheezes.  Abdominal: Soft. Bowel sounds are normal. There is no tenderness.  Musculoskeletal: Normal range of motion.  Neurological: He is alert and oriented to person, place, and time.  Skin: Skin is warm and dry.  Psychiatric: He has a normal mood and affect. His behavior is normal. Judgment and thought content normal.  Nursing note and vitals reviewed.   Mallampati Score:  MD Evaluation Airway: WNL Heart: WNL Abdomen: WNL Chest/ Lungs: WNL ASA   Classification: 3 Mallampati/Airway Score: One  Imaging: Ct Abdomen Pelvis Wo Contrast  01/06/2015   CLINICAL DATA:  Cholecystitis.  EXAM: CT ABDOMEN AND PELVIS WITHOUT CONTRAST  TECHNIQUE: Multidetector CT imaging of the abdomen and pelvis was performed following the standard protocol without IV contrast.  COMPARISON:  CT scan of November 26, 2014.  FINDINGS: Mild multilevel degenerative disc disease is noted in the lumbar spine. No significant abnormality is noted in visualized lung bases.  The spleen and pancreas appear normal. Adrenal glands and kidneys appear normal. Atherosclerotic calcifications of abdominal aorta and iliac arteries are noted without aneurysm formation. Mild prostatic hypertrophy is noted. Urinary bladder appears normal. There is no evidence of bowel obstruction. No renal or ureteral calculi are noted. No hydronephrosis or renal obstruction is noted.  Gallbladder wall thickening is noted with surrounding fluid suggesting cholecystitis. Fluid is also seen along the lateral margin of the right hepatic lobe. Cholecystostomy drainage catheter is not visualized and presumably has been removed.  IMPRESSION: Gallbladder wall thickening with surrounding fluid is noted suggesting cholecystitis. Fluid is also noted along the lateral margin of the right hepatic lobe. Cholecystostomy drainage catheter is not visualized and presumably has been removed.   Electronically Signed   By: Marijo Conception, M.D.   On: 01/06/2015 20:35   Dg Chest 2 View  12/26/2014   CLINICAL DATA:  Acute onset of nausea. Preoperative chest radiograph for gallbladder surgery. Initial encounter.  EXAM: CHEST  2 VIEW  COMPARISON:  Chest radiograph performed 11/28/2014  FINDINGS: The lungs are well-aerated. Minimal bibasilar atelectasis is noted. There is no evidence of pleural effusion or pneumothorax.  The heart is borderline enlarged. The patient is status post median sternotomy, with evidence of prior CABG. No acute osseous  abnormalities are seen.  IMPRESSION: Minimal bibasilar atelectasis noted.  Borderline cardiomegaly.   Electronically Signed   By: Garald Balding M.D.   On: 12/26/2014 01:46   Ir Sinus/fist Tube Chk-non Gi  01/06/2015   CLINICAL DATA:  74 year old with history of cholecystitis and recently placed percutaneous cholecystostomy tube. The catheter is  no longer draining and unable to flush the catheter. Patient presents for catheter evaluation.  EXAM: INJECTION AND ATTEMPTED EXCHANGE OF PERCUTANEOUS CHOLECYSTOSTOMY TUBE.  Physician: Stephan Minister. Henn, MD  FLUOROSCOPY TIME:  9 minutes and 6 seconds, 259.4 mGy  MEDICATIONS AND MEDICAL HISTORY: None  ANESTHESIA/SEDATION: Moderate sedation time: None  CONTRAST:  10 mL Omnipaque-300  PROCEDURE: The procedure was explained to the patient. The risks and benefits of the procedure were discussed and the patient's questions were addressed. Informed consent was obtained from the patient. The tube would not flush even with a 3 mL syringe. Unable to inject contrast through the tube. The existing catheter and surrounding skin were prepped and draped in sterile fashion. Maximal barrier sterile technique was utilized including caps, mask, sterile gowns, sterile gloves, sterile drape, hand hygiene and skin antiseptic. The skin around the catheter was anesthetized with 1% lidocaine. The suture was removed. Multiple different wires were advanced through the catheter. There was an abrupt obstruction in the midportion of the catheter. Despite using multiple wires and different techniques, wire could never be successfully advanced through the catheter. Multiple attempts were made to place a second catheter along the existing catheter. These attempts to place a "parallel catheter" were unsuccessful. As a result, the catheter was slowly pulled out of the patient until the point of obstruction was exposed. The catheter was cut at the point of obstruction but, unfortunately, the remaining catheter  was also completely obstructed. Therefore, a wire could be advanced through this catheter. At this point, there was no option but to completely remove the catheter. A 5 French Kumpe catheter was advanced through the track and multiple attempts were made to cannulate the old tract. These attempts were unsuccessful. In addition, the patient was very uncomfortable with any manipulation. Bandage was placed at the site.  FINDINGS: The percutaneous cholecystostomy tube appeared to appropriately positioned based on fluoroscopy. The mid and distal aspect of the catheter were completely obstructed. After the catheter was removed, rock-hard material was identified within the catheter. Unable to replace a catheter through the old tract. Dark bile was draining from the site.  Estimated blood loss: None  COMPLICATIONS: None  IMPRESSION: Complete obstruction of the percutaneous cholecystostomy tube. The cholecystostomy tube was removed but a new tube could not be placed through the old tract.  Following the procedure, the patient was complaining of increased abdominal pain. Patient was at risk for bile leakage and peritonitis because this was a non-mature cholecystostomy tube tract. These findings were discussed with Dr. Lucia Gaskins of General Surgery. Patient was transferred to the emergency department and later admitted to the hospital.   Electronically Signed   By: Markus Daft M.D.   On: 01/06/2015 17:50   Ir Perc Cholecystostomy  12/28/2014   INDICATION: 74 year old male with a history of acute cholecystitis. The patient has been assessed as a high surgical risk for complications. He has been referred by surgery for percutaneous cholecystostomy tube.  EXAM: ULTRASOUND AND FLUOROSCOPIC-GUIDED CHOLECYSTOSTOMY TUBE PLACEMENT  COMPARISON:  Ultrasound 12/25/2014, CT 11/26/2014  MEDICATIONS: Fentanyl 125 mcg IV; Versed 3.0 mg IV;  Levaquin 500 mg IV administered.  25 mg Demerol.  ANESTHESIA/SEDATION: Total Moderate Sedation Time   Forty-five minutes  CONTRAST:  61mL OMNIPAQUE IOHEXOL 300 MG/ML  SOLN  FLUOROSCOPY TIME:  2 minutes 6 seconds  COMPLICATIONS: SIR category 2  PROCEDURE: Informed written consent was obtained from the patient after a discussion of the risks, benefits and alternatives to treatment. Questions regarding the procedure were  encouraged and answered. A timeout was performed prior to the initiation of the procedure.  The right upper abdominal quadrant was prepped and draped in the usual sterile fashion, and a sterile drape was applied covering the operative field. Maximum barrier sterile technique with sterile gowns and gloves were used for the procedure. A timeout was performed prior to the initiation of the procedure. Local anesthesia was provided with 1% lidocaine with epinephrine.  Ultrasound scanning of the right upper quadrant demonstrates a markedly dilated gallbladder. Of note, the patient reported pain with ultrasound imaging over the gallbladder. Utilizing a transhepatic approach, a 22 gauge needle was advanced into the gallbladder under direct ultrasound guidance. An ultrasound image was saved for documentation purposes. Appropriate intraluminal puncture was confirmed with the efflux of bile and advancement of an 0.018 wire into the gallbladder lumen. The needle was exchanged for an Hurst set. A small amount of contrast was injected to confirm appropriate intraluminal positioning. Over a Benson wire, a 44.2-French Cook cholecystomy tube was advanced into the gallbladder fossa, coiled and locked. Bile was aspirated and a small amount of contrast was injected as several post procedural spot radiographic images were obtained in various obliquities. The catheter was secured to the skin with suture, connected to a drainage bag and a dressing was placed.  During the drain placement, the patient became hypotensive, complained of feeling warm, and experienced nausea with right upper quadrant pain.  No significant  blood loss.  IMPRESSION: Status post placement of percutaneous cholecystostomy tube. A sample was sent to the lab for analysis.  Signed,  Dulcy Fanny. Earleen Newport, DO  [facility]  [facility]  PLAN: There is a concern for bacteremia/sepsis after the procedure, given that the patient became hypotensive, nauseated, and complained of feeling warm/febrile.  Levaquin 500 mg IV antibiotics will cover bacteremia.  Fluid bolus of 500 cc was ordered as well as 150 cc/hour of normal saline.  Demerol 25 mg.  Toradol 30 mg IV for 3 doses was ordered.  Zofran 8 mg IV.  The primary medical team was contacted for this result, and potential elevation to a higher level of care was discussed.   Electronically Signed   By: Corrie Mckusick D.O.   On: 12/28/2014 15:28   US Abdomen Limited Ruq  12/25/2014   CLINICAL DATA:  Right upper quadrant pain  EXAM: US ABDOMEN LIMITED - RIGHT UPPER QUADRANT  COMPARISON:  11/26/14  FINDINGS: Gallbladder:  Again noted tumefactive sludge within gallbladder. There is progression of gallbladder wall thickening up to 9.8 mm. Findings are suspicious for acute cholecystitis and clinical correlation is necessary. There is positive sonographic Murphy's sign.  Common bile duct:  Diameter: 5.7 mm in diameter within normal limits.  Liver:  No focal lesion identified. Within normal limits in parenchymal echogenicity.  IMPRESSION: Again noted tumefactive sludge within gallbladder. There is progression of gallbladder wall thickening up to 9.8 mm. Findings are suspicious for acute cholecystitis and clinical correlation is necessary. There is positive sonographic Murphy's sign.   Electronically Signed   By: Lahoma Crocker M.D.   On: 12/25/2014 18:28    Labs:  CBC:  Recent Labs  12/30/14 0049 12/31/14 0550 01/02/15 1534 01/06/15 1420 01/07/15 0539  WBC 12.8* 9.6  --  15.9* 12.9*  HGB 10.5* 10.0* 11.7* 13.4 11.9*  HCT 32.8* 30.7*  --  40.2 37.0*  PLT 272 250  --  388  348    COAGS:  Recent Labs  12/31/14 0550 01/02/15  1508 01/06/15 1528 01/07/15 0539  INR 1.61* 2.2 1.78* 1.81*    BMP:  Recent Labs  12/28/14 0508 12/29/14 0245 01/06/15 1420 01/07/15 0539  NA 139 139 138 134*  K 4.2 4.5 4.5 4.7  CL 99 105 98 96  CO2 35* 27 24 29   GLUCOSE 92 74 139* 90  BUN 24* 21 30* 28*  CALCIUM 8.8 8.4 9.8 9.3  CREATININE 1.82* 1.79* 1.90* 1.68*  GFRNONAA 35* 35* 33* 38*  GFRAA 40* 41* 38* 44*    LIVER FUNCTION TESTS:  Recent Labs  12/28/14 0508 12/29/14 0245 01/06/15 1420 01/07/15 0539  BILITOT 0.8 0.6 0.7 0.7  AST 25 23 35 28  ALT 28 24 27 22   ALKPHOS 60 54 70 60  PROT 6.6 6.1 9.3* 6.7  ALBUMIN 3.4* 3.0* 4.2 3.3*    TUMOR MARKERS: No results for input(s): AFPTM, CEA, CA199, CHROMGRNA in the last 8760 hours.  Assessment and Plan:  Perc Chole drain placed 12/28/14 Clogged with stones per Dr Anselm Pancoast eval 4/4 in IR Removed drain and pt admitted for pain control Now [facility] requesting re drain placement Risks and Benefits discussed with the patient including, but not limited to bleeding, infection, gallbladder perforation, bile leak, sepsis or even death. All of the patient's questions were answered, patient is agreeable to proceed. Consent signed and in chart. INR 1.8---will discuss with Dr Pascal Lux  Thank you for this interesting consult.  I greatly enjoyed meeting [name] [name] and look forward to participating in their care.  Signed: Dyasia Firestine A 01/07/2015, 8:51 AM   I spent a total of 40 Minutes  in face to face in clinical consultation, greater than 50% of which was counseling/coordinating care for new chole drain placement

## 2015-01-07 NOTE — Sedation Documentation (Signed)
MD at bedside, Dr. Pascal Lux, in to speak with patient and family.

## 2015-01-07 NOTE — Procedures (Signed)
Attempted but ultimately unsuccessful [place] and CT guided cholecystomy tube placement.  No immediate post procedural complications.

## 2015-01-08 DIAGNOSIS — K819 Cholecystitis, unspecified: Secondary | ICD-10-CM

## 2015-01-08 LAB — COMPREHENSIVE METABOLIC PANEL
ALBUMIN: 3.3 g/dL — AB (ref 3.5–5.2)
ALT: 21 U/L (ref 0–53)
ANION GAP: 7 (ref 5–15)
AST: 27 U/L (ref 0–37)
Alkaline Phosphatase: 59 U/L (ref 39–117)
BILIRUBIN TOTAL: 0.5 mg/dL (ref 0.3–1.2)
BUN: 24 mg/dL — AB (ref 6–23)
CHLORIDE: 98 mmol/L (ref 96–112)
CO2: 32 mmol/L (ref 19–32)
Calcium: 8.9 mg/dL (ref 8.4–10.5)
Creatinine, Ser: 1.53 mg/dL — ABNORMAL HIGH (ref 0.50–1.35)
GFR calc Af Amer: 50 mL/min — ABNORMAL LOW (ref >90)
GFR calc non Af Amer: 43 mL/min — ABNORMAL LOW (ref >90)
Glucose, Bld: 100 mg/dL — ABNORMAL HIGH (ref 70–99)
Potassium: 4.7 mmol/L (ref 3.5–5.1)
SODIUM: 137 mmol/L (ref 135–145)
Total Protein: 6.6 g/dL (ref 6.0–8.3)

## 2015-01-08 LAB — CBC
HEMATOCRIT: 34.7 % — AB (ref 39.0–52.0)
HEMOGLOBIN: 11.2 g/dL — AB (ref 13.0–17.0)
MCH: 30.5 pg (ref 26.0–34.0)
MCHC: 32.3 g/dL (ref 30.0–36.0)
MCV: 94.6 fL (ref 78.0–100.0)
Platelets: 349 10*3/uL (ref 150–400)
RBC: 3.67 MIL/uL — ABNORMAL LOW (ref 4.22–5.81)
RDW: 13.8 % (ref 11.5–15.5)
WBC: 10.7 10*3/uL — AB (ref 4.0–10.5)

## 2015-01-08 MED ORDER — CIPROFLOXACIN HCL 500 MG PO TABS: 500.0000 mg | ORAL_TABLET | Freq: Two times a day (BID) | ORAL | Status: DC

## 2015-01-08 MED ORDER — BOOST PLUS PO LIQD
237.0000 mL | Freq: Three times a day (TID) | ORAL | Status: DC
  Administered 2015-01-08: 237 mL via ORAL
  Filled 2015-01-08 (×4): qty 237

## 2015-01-08 MED ORDER — HYDROCODONE-ACETAMINOPHEN 5-325 MG PO TABS: 1.0000 | ORAL_TABLET | Freq: Four times a day (QID) | ORAL | Status: DC | PRN

## 2015-01-08 MED ORDER — REGADENOSON 0.4 MG/5ML IV SOLN
INTRAVENOUS | Status: AC
  Filled 2015-01-08: qty 5

## 2015-01-08 NOTE — Progress Notes (Signed)
Patient discharged to home with instructions. 

## 2015-01-08 NOTE — Discharge Summary (Signed)
Physician Discharge Summary  [name] [name] [id number] DOB: [birth year] DOA: 01/06/2015  PCP: [name], MD  Admit date: 01/06/2015 Discharge date: 01/08/2015  Recommendations for Outpatient Follow-up:  1. Pt will need to follow up with PCP in 2-3 weeks post discharge 2. Please obtain BMP to evaluate electrolytes and kidney function 3. Please also check CBC to evaluate Hg and Hct levels 4. Please note that pt will continue taking Coumadin per home regimen, he is made aware he needs to see PCP to have PT/INR checked early next week so that dose of Coumadin can be readjusted if needed  5. Please note that pt was discharged on Ciprofloxacin to complete therapy for 7 more days post discharge per surgery team recommendations   Discharge Diagnoses:  Principal Problem:   Cholecystitis Active Problems:   COPD (chronic obstructive pulmonary disease) with emphysema   GERD   Anticoagulant long-term use   Hyperlipemia   Atrial fibrillation, chronic   Coronary atherosclerosis of native coronary artery   CKD (chronic kidney disease), stage III  Discharge Condition: Stable  Diet recommendation: Soft diet tolerated while hospitalized   History of present illness:    Hospital Course:  Principal Problem:   SIRS secondary to 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 Earleen Newport, drain subsequently became closed and was removed by Dr. Anselm Pancoast IR on 01/06/15 - after two attempts, drain could not be placed back  - pt is doing well so far and insisting on going home today, no signs of peritonitis on exam  - pt made aware he is at high risk of peritonitis from biliary leak and he was made aware f he starts developing N/V, fevers, he needs to come back and will need repeat CT abd and possible re attempt of perc chole tube  - OK from surgery stand point to resume coumadin upon discharge  - surgery team cleared for discharge     Leukocytosis - secondary to the  above - WBC is trending down     ASCAD with hx of CABG - Cardiac cath 07/2014 showing occluded SVG to distal LAD, high grade proximal 95% SVG to RCA, severe native CAD with total occlusion of the mid RCA, total occlusion of prox LAD and total occlusion of the prox LCX, patent LIMA to LAD with 50-70% ostial/proximal LIMA and underwent BMS of the SVG to RCA.  - BMS was done due to history GI bleeding in the past with chronic anemia on anticoagulation therapy for afib.     Ischemic DCM  - EF now normalized by echo 10/2014, on Bidil    Chronic atrial fibrillation  - rate controlled, on chronic Coumadin    Stage III chronic renal disease - Cr has trended down since admission, overall stable    COPD with chronic dyspnea - respiratory status stable this AM - continue home medical regimen with BD's    HTN - has remained stable while inpatient     GERD - continue PPI     Hyperlipidemia - continue statin per home medical regimen     Non Severe PCM - in the context of acute illness on chronic illness outlined below - diet advanced to soft but pt still with poor oral intake, eating less than 50% of the meal   Code Status:full Family Communication: family at bedside Disposition Plan: Home today    Procedures/Studies: Ct Abdomen Pelvis Wo Contrast  01/06/2015   CLINICAL DATA:  Cholecystitis.  EXAM: CT ABDOMEN  AND PELVIS WITHOUT CONTRAST  TECHNIQUE: Multidetector CT imaging of the abdomen and pelvis was performed following the standard protocol without IV contrast.  COMPARISON:  CT scan of November 26, 2014.  FINDINGS: Mild multilevel degenerative disc disease is noted in the lumbar spine. No significant abnormality is noted in visualized lung bases.  The spleen and pancreas appear normal. Adrenal glands and kidneys appear normal. Atherosclerotic calcifications of abdominal aorta and iliac arteries are noted without aneurysm formation. Mild prostatic hypertrophy is noted. Urinary bladder  appears normal. There is no evidence of bowel obstruction. No renal or ureteral calculi are noted. No hydronephrosis or renal obstruction is noted.  Gallbladder wall thickening is noted with surrounding fluid suggesting cholecystitis. Fluid is also seen along the lateral margin of the right hepatic lobe. Cholecystostomy drainage catheter is not visualized and presumably has been removed.  IMPRESSION: Gallbladder wall thickening with surrounding fluid is noted suggesting cholecystitis. Fluid is also noted along the lateral margin of the right hepatic lobe. Cholecystostomy drainage catheter is not visualized and presumably has been removed.   Electronically Signed   By: Marijo Conception, M.D.   On: 01/06/2015 20:35   Dg Chest 2 View  12/26/2014   CLINICAL DATA:  Acute onset of nausea. Preoperative chest radiograph for gallbladder surgery. Initial encounter.  EXAM: CHEST  2 VIEW  COMPARISON:  Chest radiograph performed 11/28/2014  FINDINGS: The lungs are well-aerated. Minimal bibasilar atelectasis is noted. There is no evidence of pleural effusion or pneumothorax.  The heart is borderline enlarged. The patient is status post median sternotomy, with evidence of prior CABG. No acute osseous abnormalities are seen.  IMPRESSION: Minimal bibasilar atelectasis noted.  Borderline cardiomegaly.   Electronically Signed   By: Garald Balding M.D.   On: 12/26/2014 01:46   Ir Sinus/fist Tube Chk-non Gi  01/06/2015   CLINICAL DATA:  74 year old with history of cholecystitis and recently placed percutaneous cholecystostomy tube. The catheter is no longer draining and unable to flush the catheter. Patient presents for catheter evaluation.  EXAM: INJECTION AND ATTEMPTED EXCHANGE OF PERCUTANEOUS CHOLECYSTOSTOMY TUBE.  Physician: Stephan Minister. Henn, MD  FLUOROSCOPY TIME:  9 minutes and 6 seconds, 259.4 mGy  MEDICATIONS AND MEDICAL HISTORY: None  ANESTHESIA/SEDATION: Moderate sedation time: None  CONTRAST:  10 mL Omnipaque-300  PROCEDURE:  The procedure was explained to the patient. The risks and benefits of the procedure were discussed and the patient's questions were addressed. Informed consent was obtained from the patient. The tube would not flush even with a 3 mL syringe. Unable to inject contrast through the tube. The existing catheter and surrounding skin were prepped and draped in sterile fashion. Maximal barrier sterile technique was utilized including caps, mask, sterile gowns, sterile gloves, sterile drape, hand hygiene and skin antiseptic. The skin around the catheter was anesthetized with 1% lidocaine. The suture was removed. Multiple different wires were advanced through the catheter. There was an abrupt obstruction in the midportion of the catheter. Despite using multiple wires and different techniques, wire could never be successfully advanced through the catheter. Multiple attempts were made to place a second catheter along the existing catheter. These attempts to place a "parallel catheter" were unsuccessful. As a result, the catheter was slowly pulled out of the patient until the point of obstruction was exposed. The catheter was cut at the point of obstruction but, unfortunately, the remaining catheter was also completely obstructed. Therefore, a wire could be advanced through this catheter. At this point, there was no option  but to completely remove the catheter. A 5 French Kumpe catheter was advanced through the track and multiple attempts were made to cannulate the old tract. These attempts were unsuccessful. In addition, the patient was very uncomfortable with any manipulation. Bandage was placed at the site.  FINDINGS: The percutaneous cholecystostomy tube appeared to appropriately positioned based on fluoroscopy. The mid and distal aspect of the catheter were completely obstructed. After the catheter was removed, rock-hard material was identified within the catheter. Unable to replace a catheter through the old tract. Dark  bile was draining from the site.  Estimated blood loss: None  COMPLICATIONS: None  IMPRESSION: Complete obstruction of the percutaneous cholecystostomy tube. The cholecystostomy tube was removed but a new tube could not be placed through the old tract.  Following the procedure, the patient was complaining of increased abdominal pain. Patient was at risk for bile leakage and peritonitis because this was a non-mature cholecystostomy tube tract. These findings were discussed with Dr. Lucia Gaskins of General Surgery. Patient was transferred to the emergency department and later admitted to the hospital.   Electronically Signed   By: Markus Daft M.D.   On: 01/06/2015 17:50   Ir Perc Cholecystostomy  12/28/2014   INDICATION: 74 year old male with a history of acute cholecystitis. The patient has been assessed as a high surgical risk for complications. He has been referred by surgery for percutaneous cholecystostomy tube.  EXAM: ULTRASOUND AND FLUOROSCOPIC-GUIDED CHOLECYSTOSTOMY TUBE PLACEMENT  COMPARISON:  Ultrasound 12/25/2014, CT 11/26/2014  MEDICATIONS: Fentanyl 125 mcg IV; Versed 3.0 mg IV;  Levaquin 500 mg IV administered.  25 mg Demerol.  ANESTHESIA/SEDATION: Total Moderate Sedation Time  Forty-five minutes  CONTRAST:  48mL OMNIPAQUE IOHEXOL 300 MG/ML  SOLN  FLUOROSCOPY TIME:  2 minutes 6 seconds  COMPLICATIONS: SIR category 2  PROCEDURE: Informed written consent was obtained from the patient after a discussion of the risks, benefits and alternatives to treatment. Questions regarding the procedure were encouraged and answered. A timeout was performed prior to the initiation of the procedure.  The right upper abdominal quadrant was prepped and draped in the usual sterile fashion, and a sterile drape was applied covering the operative field. Maximum barrier sterile technique with sterile gowns and gloves were used for the procedure. A timeout was performed prior to the initiation of the procedure. Local anesthesia was  provided with 1% lidocaine with epinephrine.  Ultrasound scanning of the right upper quadrant demonstrates a markedly dilated gallbladder. Of note, the patient reported pain with ultrasound imaging over the gallbladder. Utilizing a transhepatic approach, a 22 gauge needle was advanced into the gallbladder under direct ultrasound guidance. An ultrasound image was saved for documentation purposes. Appropriate intraluminal puncture was confirmed with the efflux of bile and advancement of an 0.018 wire into the gallbladder lumen. The needle was exchanged for an [place] set. A small amount of contrast was injected to confirm appropriate intraluminal positioning. Over a Benson wire, a 33.2-French Cook cholecystomy tube was advanced into the gallbladder fossa, coiled and locked. Bile was aspirated and a small amount of contrast was injected as several post procedural spot radiographic images were obtained in various obliquities. The catheter was secured to the skin with suture, connected to a drainage bag and a dressing was placed.  During the drain placement, the patient became hypotensive, complained of feeling warm, and experienced nausea with right upper quadrant pain.  No significant blood loss.  IMPRESSION: Status post placement of percutaneous cholecystostomy tube. A sample was sent to the lab  for analysis.  Signed,  Dulcy Fanny. Earleen Newport, DO  [facility]  [facility]  PLAN: There is a concern for bacteremia/sepsis after the procedure, given that the patient became hypotensive, nauseated, and complained of feeling warm/febrile.  Levaquin 500 mg IV antibiotics will cover bacteremia.  Fluid bolus of 500 cc was ordered as well as 150 cc/hour of normal saline.  Demerol 25 mg.  Toradol 30 mg IV for 3 doses was ordered.  Zofran 8 mg IV.  The primary medical team was contacted for this result, and potential elevation to a higher level of care was discussed.   Electronically Signed    By: Corrie Mckusick D.O.   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 Patient underwent ultrasound fluoroscopic guided cholecystostomy tube placement on 12/28/2014 however returned on 01/06/2015 for evaluation due to inability to flush the cholecystostomy tube.  The cholecystostomy tube was unable to be exchanged secondary to complete tube exclusion and access to the gallbladder fossa was ultimately loss during the attempted exchange.  Replaced made for placement of a new percutaneous cholecystostomy tube.  EXAM: ATTEMPTED ULTRASOUND AND CT-GUIDED CHOLECYSTOSTOMY TUBE PLACEMENT  COMPARISON:  Ultrasound fluoroscopic guided cholecystostomy tube placement - 12/28/2014; attempted fluoroscopic guided cholecystostomy tube exchange - 01/06/2015; CT abdomen and pelvis - 01/06/2015  MEDICATIONS: The patient is currently admitted to the hospital and receiving intravenous antibiotics. The antibiotics were administered within an appropriate time frame prior to the initiation of the procedure.  ANESTHESIA/SEDATION: Fentanyl 250 mcg IV; Versed 5 mg IV  Total Moderate Sedation time  50 minutes  CONTRAST:  None  COMPLICATIONS: None immediate  PROCEDURE: Informed written consent was obtained from the patient after a discussion of the risks, benefits and alternatives to treatment. The patient was placed supine on the CT gantry and a pre procedural CT was performed re- demonstrated underdistention of the gallbladder with a very minimal amount of pericholecystic fluid.  The procedure was planned. A timeout was performed prior to the initiation of the procedure. The right lateral upper abdomen was prepped and draped in the usual sterile fashion. The overlying soft tissues were anesthetized with 1% lidocaine with epinephrine.  Initially, trajectory was planned with the use of 22 gauge spinal needle under direct ultrasound  guidance. Appropriate positioning was confirmed and 18 gauge trocar needle was advanced into the gallbladder lumen also under direct ultrasound guidance.  Note, there was difficulty advancing the trocar needle through the decompressed patulous wall of the gallbladder however appropriate positioning was ultimately confirmed with a combination of ultrasound and CT guidance.  Next, there is great difficulty coiling the Amplatz wire within the gallbladder lumen secondary to friability of the gallbladder wall. Ultimately, the Amplatz wire was coiled within the neck of the decompressed gallbladder as confirmed with CT imaging (series 10), however despite serial dilatation, a 10 French percutaneous drain would not coil within the gallbladder fossa and as such was subsequently removed.  A dressing was placed. The patient tolerated the attempted procedure well without immediate postprocedural complication.  IMPRESSION: Unsuccessful attempted ultrasound and CT guided placement of a 10 French cholecystostomy tube secondary to gallbladder underdistention and marked friability of the gallbladder wall.  Above discussed with to general surgery PA, [place] at 14:37.   Electronically Signed   By: Sandi Mariscal M.D.   On: 01/07/2015 14:47   US Abdomen Limited Ruq  12/25/2014  CLINICAL DATA:  Right upper quadrant pain  EXAM: US ABDOMEN LIMITED - RIGHT UPPER QUADRANT  COMPARISON:  11/26/14  FINDINGS: Gallbladder:  Again noted tumefactive sludge within gallbladder. There is progression of gallbladder wall thickening up to 9.8 mm. Findings are suspicious for acute cholecystitis and clinical correlation is necessary. There is positive sonographic Murphy's sign.  Common bile duct:  Diameter: 5.7 mm in diameter within normal limits.  Liver:  No focal lesion identified. Within normal limits in parenchymal echogenicity.  IMPRESSION: Again noted tumefactive sludge within gallbladder. There is progression of gallbladder wall thickening up to 9.8  mm. Findings are suspicious for acute cholecystitis and clinical correlation is necessary. There is positive sonographic Murphy's sign.   Electronically Signed   By: Lahoma Crocker M.D.   On: 12/25/2014 18:28   Discharge Exam: Filed Vitals:   01/08/15 0612  BP: 155/53  Pulse: 77  Temp: 98.8 F (37.1 C)  Resp: 16   Filed Vitals:   01/07/15 2245 01/07/15 2304 01/08/15 0612 01/08/15 0947  BP: 142/49  155/53   Pulse: 78 76 77   Temp: 98.8 F (37.1 C)  98.8 F (37.1 C)   TempSrc: Oral  Oral   Resp: 16 16 16    Height:      Weight:   93.985 kg (207 lb 3.2 oz)   SpO2: 97%  97% 92%    General: Pt is alert, follows commands appropriately, not in acute distress Cardiovascular: Regular rate and rhythm, no rubs, no gallops Respiratory: Clear to auscultation bilaterally, no wheezing, no crackles, no rhonchi Abdominal: Soft, non tender, non distended, no guarding Extremities: no cyanosis, pulses palpable bilaterally DP and PT Neuro: Grossly nonfocal  Discharge Instructions  Discharge Instructions    Diet - low sodium heart healthy    Complete by:  As directed      Increase activity slowly    Complete by:  As directed             Medication List    STOP taking these medications        levofloxacin 750 MG tablet  Commonly known as:  LEVAQUIN      TAKE these medications        acetaminophen 500 MG tablet  Commonly known as:  TYLENOL  Take 1,000 mg by mouth every 6 (six) hours as needed. For pain.     albuterol 108 (90 BASE) MCG/ACT inhaler  Commonly known as:  PROVENTIL HFA;VENTOLIN HFA  Inhale 2 puffs into the lungs every 6 (six) hours as needed. For shortness of breath.     aspirin EC 81 MG tablet  Take 1 tablet (81 mg total) by mouth every Monday, Wednesday, and Friday.     budesonide 0.25 MG/2ML nebulizer solution  Commonly known as:  PULMICORT  Take 0.25 mg by nebulization 2 (two) times daily.     ciprofloxacin 500 MG tablet  Commonly known as:  CIPRO  Take 1  tablet (500 mg total) by mouth 2 (two) times daily.     docusate sodium 100 MG capsule  Commonly known as:  COLACE  Take 1 capsule (100 mg total) by mouth 2 (two) times daily.     esomeprazole 40 MG capsule  Commonly known as:  NEXIUM  Take twice daily on an empty stomach     ferrous sulfate 325 (65 FE) MG tablet  Take 325 mg by mouth 2 (two) times daily with a meal.     furosemide 40 MG tablet  Commonly  known as:  LASIX  Take 1 tablet (40 mg total) by mouth daily.     hydrALAZINE 25 MG tablet  Commonly known as:  APRESOLINE  Take 1 tablet (25 mg total) by mouth 3 (three) times daily.     HYDROcodone-acetaminophen 5-325 MG per tablet  Commonly known as:  NORCO/VICODIN  Take 1 tablet by mouth every 6 (six) hours as needed for moderate pain.     ipratropium-albuterol 0.5-2.5 (3) MG/3ML Soln  Commonly known as:  DUONEB  Take 3 mLs by nebulization every 6 (six) hours as needed. For shortness of breath.     isosorbide-hydrALAZINE 20-37.5 MG per tablet  Commonly known as:  BIDIL  Take 1 tablet by mouth 3 (three) times daily.     levothyroxine 112 MCG tablet  Commonly known as:  SYNTHROID, LEVOTHROID  Take 1 tablet (112 mcg total) by mouth daily before breakfast.     lovastatin 40 MG tablet  Commonly known as:  MEVACOR  Take 1 tablet (40 mg total) by mouth at bedtime.     multivitamins ther. w/minerals Tabs tablet  Take 1 tablet by mouth daily.     nitroGLYCERIN 0.4 MG SL tablet  Commonly known as:  NITROSTAT  Place 0.4 mg under the tongue every 5 (five) minutes as needed for chest pain.     oxymetazoline 0.05 % nasal spray  Commonly known as:  AFRIN  Place 1 spray into both nostrils 2 (two) times daily as needed for congestion.     potassium chloride 10 MEQ tablet  Commonly known as:  K-DUR  Take 1 tablet (10 mEq total) by mouth daily.     sodium chloride 0.65 % Soln nasal spray  Commonly known as:  OCEAN  Place 1 spray into both nostrils as needed for congestion.      Vitamin D3 5000 UNITS Caps  Take 5,000 Units by mouth daily.     warfarin 5 MG tablet  Commonly known as:  COUMADIN  Take 2.5-5 mg by mouth daily. Take 5 mg Monday, Tuesday, Wednesday, Friday, and Sat. Take 2.5 mg on Thursday and Sunday            Follow-up Information    Follow up with WYATT, JAY, MD. Schedule an appointment as soon as possible for a visit in 2 weeks.   Specialty:  General Surgery   Why:  For post-hospital follow up   Contact information:   [address] [place] [address] [phone]       Follow up with [name], MD.   Specialty:  [facility]   Contact information:   [place] [address] [phone]        The results of significant diagnostics from this hospitalization (including imaging, microbiology, ancillary and laboratory) are listed below for reference.     Microbiology: Recent Results (from the past 240 hour(s))  Urine culture     Status: None   Collection Time: 01/06/15  3:43 PM  Result Value Ref Range Status   Specimen Description URINE, RANDOM  Final   Special Requests NONE  Final   Colony Count NO GROWTH Performed at [facility]   Final   Culture NO GROWTH Performed at [facility]   Final   Report Status 01/07/2015 FINAL  Final     Labs: Basic Metabolic Panel:  Recent Labs Lab 01/06/15 1420 01/07/15 0539  NA 138 134*  K 4.5 4.7  CL 98 96  CO2 24 29  GLUCOSE 139* 90  BUN 30* 28*  CREATININE 1.90* 1.68*  CALCIUM 9.8 9.3   Liver Function Tests:  Recent Labs Lab 01/06/15 1420 01/07/15 0539  AST 35 28  ALT 27 22  ALKPHOS 70 60  BILITOT 0.7 0.7  PROT 9.3* 6.7  ALBUMIN 4.2 3.3*    Recent Labs Lab 01/06/15 1420  LIPASE 24   CBC:  Recent Labs Lab 01/02/15 1534 01/06/15 1420 01/07/15 0539  WBC  --  15.9* 12.9*  NEUTROABS  --  11.9* 9.5*  HGB 11.7* 13.4 11.9*  HCT  --  40.2 37.0*  MCV  --  94.1 94.6  PLT  --  388 348    ProBNP (last  3 results)  Recent Labs  01/23/14 0842 06/04/14 1056  PROBNP 111.0* 147.0*    SIGNED: Time coordinating discharge: Over 30 minutes  Faye Ramsay, MD  [facility] 01/08/2015, 10:07 AM Pager [phone]  If 7PM-7AM, please contact night-coverage [email or web address] Password [email or web address]

## 2015-01-08 NOTE — Discharge Instructions (Signed)

## 2015-01-08 NOTE — Progress Notes (Signed)
[facility] Progress Note     Subjective: Pt doing well, says he has minimal pain.  Pain has not returned like before.  No N/V, but appetite low.  He is tolerating clears well and ensure.  He prefers boost.  Ambulating well OOB.  Wants to go home.  Objective: Vital signs in last 24 hours: Temp:  [98.8 F (37.1 C)] 98.8 F (37.1 C) (04/06 0612) Pulse Rate:  [76-92] 77 (04/06 0612) Resp:  [10-16] 16 (04/06 0612) BP: (120-167)/(42-78) 155/53 mmHg (04/06 0612) SpO2:  [93 %-98 %] 97 % (04/06 0612) Weight:  [93.985 kg (207 lb 3.2 oz)] 93.985 kg (207 lb 3.2 oz) (04/06 0612) Last BM Date: 01/06/15  Intake/Output from previous day:   Intake/Output this shift:    PE: Gen:  Alert, NAD, pleasant Card:  RRR, no M/G/R heard Pulm:  CTA, no W/R/R, good effort Abd: Soft, NT/ND, +BS, no HSM, drains removed, right side draining serosanguinous drainage only  Lab Results:   Recent Labs  01/06/15 1420 01/07/15 0539  WBC 15.9* 12.9*  HGB 13.4 11.9*  HCT 40.2 37.0*  PLT 388 348   BMET  Recent Labs  01/06/15 1420 01/07/15 0539  NA 138 134*  K 4.5 4.7  CL 98 96  CO2 24 29  GLUCOSE 139* 90  BUN 30* 28*  CREATININE 1.90* 1.68*  CALCIUM 9.8 9.3   PT/INR  Recent Labs  01/06/15 1528 01/07/15 0539  LABPROT 20.8* 21.2*  INR 1.78* 1.81*   CMP     Component Value Date/Time   NA 134* 01/07/2015 0539   NA 139 12/23/2014 1124   K 4.7 01/07/2015 0539   CL 96 01/07/2015 0539   CO2 29 01/07/2015 0539   GLUCOSE 90 01/07/2015 0539   GLUCOSE 96 12/23/2014 1124   BUN 28* 01/07/2015 0539   BUN 34* 12/23/2014 1124   CREATININE 1.68* 01/07/2015 0539   CALCIUM 9.3 01/07/2015 0539   PROT 6.7 01/07/2015 0539   PROT 6.5 12/23/2014 1124   ALBUMIN 3.3* 01/07/2015 0539   AST 28 01/07/2015 0539   ALT 22 01/07/2015 0539   ALKPHOS 60 01/07/2015 0539   BILITOT 0.7 01/07/2015 0539   BILITOT 0.2 12/23/2014 1124   GFRNONAA 38* 01/07/2015 0539   GFRAA 44* 01/07/2015 0539    Lipase     Component Value Date/Time   LIPASE 24 01/06/2015 1420       Studies/Results: Ct Abdomen Pelvis Wo Contrast  01/06/2015   CLINICAL DATA:  Cholecystitis.  EXAM: CT ABDOMEN AND PELVIS WITHOUT CONTRAST  TECHNIQUE: Multidetector CT imaging of the abdomen and pelvis was performed following the standard protocol without IV contrast.  COMPARISON:  CT scan of November 26, 2014.  FINDINGS: Mild multilevel degenerative disc disease is noted in the lumbar spine. No significant abnormality is noted in visualized lung bases.  The spleen and pancreas appear normal. Adrenal glands and kidneys appear normal. Atherosclerotic calcifications of abdominal aorta and iliac arteries are noted without aneurysm formation. Mild prostatic hypertrophy is noted. Urinary bladder appears normal. There is no evidence of bowel obstruction. No renal or ureteral calculi are noted. No hydronephrosis or renal obstruction is noted.  Gallbladder wall thickening is noted with surrounding fluid suggesting cholecystitis. Fluid is also seen along the lateral margin of the right hepatic lobe. Cholecystostomy drainage catheter is not visualized and presumably has been removed.  IMPRESSION: Gallbladder wall thickening with surrounding fluid is noted suggesting cholecystitis. Fluid is also noted along the lateral margin of the  right hepatic lobe. Cholecystostomy drainage catheter is not visualized and presumably has been removed.   Electronically Signed   By: Marijo Conception, M.D.   On: 01/06/2015 20:35   Ir Sinus/fist Tube Chk-non Gi  01/06/2015   CLINICAL DATA:  74 year old with history of cholecystitis and recently placed percutaneous cholecystostomy tube. The catheter is no longer draining and unable to flush the catheter. Patient presents for catheter evaluation.  EXAM: INJECTION AND ATTEMPTED EXCHANGE OF PERCUTANEOUS CHOLECYSTOSTOMY TUBE.  Physician: Stephan Minister. Henn, MD  FLUOROSCOPY TIME:  9 minutes and 6 seconds, 259.4 mGy   MEDICATIONS AND MEDICAL HISTORY: None  ANESTHESIA/SEDATION: Moderate sedation time: None  CONTRAST:  10 mL Omnipaque-300  PROCEDURE: The procedure was explained to the patient. The risks and benefits of the procedure were discussed and the patient's questions were addressed. Informed consent was obtained from the patient. The tube would not flush even with a 3 mL syringe. Unable to inject contrast through the tube. The existing catheter and surrounding skin were prepped and draped in sterile fashion. Maximal barrier sterile technique was utilized including caps, mask, sterile gowns, sterile gloves, sterile drape, hand hygiene and skin antiseptic. The skin around the catheter was anesthetized with 1% lidocaine. The suture was removed. Multiple different wires were advanced through the catheter. There was an abrupt obstruction in the midportion of the catheter. Despite using multiple wires and different techniques, wire could never be successfully advanced through the catheter. Multiple attempts were made to place a second catheter along the existing catheter. These attempts to place a "parallel catheter" were unsuccessful. As a result, the catheter was slowly pulled out of the patient until the point of obstruction was exposed. The catheter was cut at the point of obstruction but, unfortunately, the remaining catheter was also completely obstructed. Therefore, a wire could be advanced through this catheter. At this point, there was no option but to completely remove the catheter. A 5 French Kumpe catheter was advanced through the track and multiple attempts were made to cannulate the old tract. These attempts were unsuccessful. In addition, the patient was very uncomfortable with any manipulation. Bandage was placed at the site.  FINDINGS: The percutaneous cholecystostomy tube appeared to appropriately positioned based on fluoroscopy. The mid and distal aspect of the catheter were completely obstructed. After the  catheter was removed, rock-hard material was identified within the catheter. Unable to replace a catheter through the old tract. Dark bile was draining from the site.  Estimated blood loss: None  COMPLICATIONS: None  IMPRESSION: Complete obstruction of the percutaneous cholecystostomy tube. The cholecystostomy tube was removed but a new tube could not be placed through the old tract.  Following the procedure, the patient was complaining of increased abdominal pain. Patient was at risk for bile leakage and peritonitis because this was a non-mature cholecystostomy tube tract. These findings were discussed with Dr. Lucia Gaskins of General Surgery. Patient was transferred to the emergency department and later admitted to the hospital.   Electronically Signed   By: Markus Daft M.D.   On: 01/06/2015 17:50   Ct Image Guided Fluid Drain By Catheter  01/07/2015   INDICATION: History of biliary dyskinesia complicated by acute cholecystitis. Patient underwent ultrasound fluoroscopic guided cholecystostomy tube placement on 12/28/2014 however returned on 01/06/2015 for evaluation due to inability to flush the cholecystostomy tube.  The cholecystostomy tube was unable to be exchanged secondary to complete tube exclusion and access to the gallbladder fossa was ultimately loss during the attempted exchange.  Replaced made for placement of a new percutaneous cholecystostomy tube.  EXAM: ATTEMPTED ULTRASOUND AND CT-GUIDED CHOLECYSTOSTOMY TUBE PLACEMENT  COMPARISON:  Ultrasound fluoroscopic guided cholecystostomy tube placement - 12/28/2014; attempted fluoroscopic guided cholecystostomy tube exchange - 01/06/2015; CT abdomen and pelvis - 01/06/2015  MEDICATIONS: The patient is currently admitted to the hospital and receiving intravenous antibiotics. The antibiotics were administered within an appropriate time frame prior to the initiation of the procedure.  ANESTHESIA/SEDATION: Fentanyl 250 mcg IV; Versed 5 mg IV  Total Moderate Sedation  time  50 minutes  CONTRAST:  None  COMPLICATIONS: None immediate  PROCEDURE: Informed written consent was obtained from the patient after a discussion of the risks, benefits and alternatives to treatment. The patient was placed supine on the CT gantry and a pre procedural CT was performed re- demonstrated underdistention of the gallbladder with a very minimal amount of pericholecystic fluid.  The procedure was planned. A timeout was performed prior to the initiation of the procedure. The right lateral upper abdomen was prepped and draped in the usual sterile fashion. The overlying soft tissues were anesthetized with 1% lidocaine with epinephrine.  Initially, trajectory was planned with the use of 22 gauge spinal needle under direct ultrasound guidance. Appropriate positioning was confirmed and 18 gauge trocar needle was advanced into the gallbladder lumen also under direct ultrasound guidance.  Note, there was difficulty advancing the trocar needle through the decompressed patulous wall of the gallbladder however appropriate positioning was ultimately confirmed with a combination of ultrasound and CT guidance.  Next, there is great difficulty coiling the Amplatz wire within the gallbladder lumen secondary to friability of the gallbladder wall. Ultimately, the Amplatz wire was coiled within the neck of the decompressed gallbladder as confirmed with CT imaging (series 10), however despite serial dilatation, a 10 French percutaneous drain would not coil within the gallbladder fossa and as such was subsequently removed.  A dressing was placed. The patient tolerated the attempted procedure well without immediate postprocedural complication.  IMPRESSION: Unsuccessful attempted ultrasound and CT guided placement of a 10 French cholecystostomy tube secondary to gallbladder underdistention and marked friability of the gallbladder wall.  Above discussed with to general surgery PA, [place] at 14:37.   Electronically Signed   By:  Sandi Mariscal M.D.   On: 01/07/2015 14:47    Anti-infectives: Anti-infectives    Start     Dose/Rate Route Frequency Ordered Stop   01/07/15 1000  cefTRIAXone (ROCEPHIN) 2 g in dextrose 5 % 50 mL IVPB - Premix    Comments:  Pharmacy may adjust dosing strength / duration / interval for maximal efficacy   2 g 100 mL/hr over 30 Minutes Intravenous Every 24 hours 01/07/15 0945     01/06/15 2100  Ampicillin-Sulbactam (UNASYN) 3 g in sodium chloride 0.9 % 100 mL IVPB  Status:  Discontinued     3 g 100 mL/hr over 60 Minutes Intravenous Every 6 hours 01/06/15 2056 01/07/15 0945       Assessment/Plan 1. Chronic/acute cholecystitis/cholelithiasis  -Had IR perc drain on 12/28/14 by Dr. Earleen Newport. The IR drain became clogged and was removed by IR's Dr. Anselm Pancoast on 01/06/2015. Unfortunately, the drain could not be replaced after two attempts. He is at risk for peritonitis from biliary leak, but clinically he has been doing well and has no evidence of a bile leak.    -If his abdominal pain, N/V etc would return we would recommend repeat CT and possible re-attempt of perc chole tube if GB was distended, or  he would need open gallbladder.  Hopefully he will continue to improve and d/c home to follow up with [place] in the office.  -Repeat labs pending  -Ambulate and IS  -SCD's and INR was 1.81 yesterday, resume coumadin today  -Tolerated clears, advance to soft diet.  -Rocephin for cholecystitis since Unasyn has a lot of resistance, would d/c home with 7 days of antibiotics (cipro)  -If tolerating soft diet and labs okay, he can d/c home today  2. ASCAD with hx of CABG; Cardiac cath 07/2014 showing occluded SVG to distal LAD, high grade proximal 95% SVG to RCA, severe native CAD with total occlusion of the mid RCA, total occlusion of prox LAD and total occlusion of the prox LCX, patent LIMA to LAD with 50-70% ostial/proximal LIMA and underwent BMS of the SVG to RCA. BMS was done due to history GI bleeding in the  past with chronic anemia on anticoagulation therapy for afib.  3. 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 Chronic atrial fibrillation rate controlled on chronic Coumadin 5. Stage III chronic renal disease 6. COPD with chronic dyspnea 7. HTN 8. Hx of hypertension 9. GERD 10. Hyperlipidemia   LOS: 1 day    Coralie Keens 01/08/2015, 9:06 AM Pager: [phone]  Agree with above. He said that he fells better with the drain out.  He has follow up with Dr. Hulen Skains in about 3 weeks to discuss interval cholecystectomy  Alphonsa Overall, MD, [facility] Pager: [phone] Office phone:  [phone]

## 2015-01-09 DIAGNOSIS — I2510 Atherosclerotic heart disease of native coronary artery without angina pectoris: Secondary | ICD-10-CM | POA: Diagnosis not present

## 2015-01-09 DIAGNOSIS — I129 Hypertensive chronic kidney disease with stage 1 through stage 4 chronic kidney disease, or unspecified chronic kidney disease: Secondary | ICD-10-CM | POA: Diagnosis not present

## 2015-01-09 DIAGNOSIS — Z48815 Encounter for surgical aftercare following surgery on the digestive system: Secondary | ICD-10-CM | POA: Diagnosis not present

## 2015-01-09 DIAGNOSIS — N183 Chronic kidney disease, stage 3 (moderate): Secondary | ICD-10-CM | POA: Diagnosis not present

## 2015-01-09 DIAGNOSIS — Z951 Presence of aortocoronary bypass graft: Secondary | ICD-10-CM | POA: Diagnosis not present

## 2015-01-09 DIAGNOSIS — I480 Paroxysmal atrial fibrillation: Secondary | ICD-10-CM | POA: Diagnosis not present

## 2015-01-09 DIAGNOSIS — Z87891 Personal history of nicotine dependence: Secondary | ICD-10-CM | POA: Diagnosis not present

## 2015-01-09 DIAGNOSIS — D649 Anemia, unspecified: Secondary | ICD-10-CM | POA: Diagnosis not present

## 2015-01-09 DIAGNOSIS — Z7901 Long term (current) use of anticoagulants: Secondary | ICD-10-CM | POA: Diagnosis not present

## 2015-01-15 ENCOUNTER — Other Ambulatory Visit: Payer: Self-pay | Admitting: Family Medicine

## 2015-01-15 ENCOUNTER — Other Ambulatory Visit: Payer: Self-pay | Admitting: Internal Medicine

## 2015-01-15 ENCOUNTER — Telehealth: Payer: Self-pay | Admitting: Pharmacist

## 2015-01-15 DIAGNOSIS — I129 Hypertensive chronic kidney disease with stage 1 through stage 4 chronic kidney disease, or unspecified chronic kidney disease: Secondary | ICD-10-CM | POA: Diagnosis not present

## 2015-01-15 DIAGNOSIS — N183 Chronic kidney disease, stage 3 (moderate): Secondary | ICD-10-CM | POA: Diagnosis not present

## 2015-01-15 DIAGNOSIS — Z7901 Long term (current) use of anticoagulants: Secondary | ICD-10-CM | POA: Diagnosis not present

## 2015-01-15 DIAGNOSIS — Z48815 Encounter for surgical aftercare following surgery on the digestive system: Secondary | ICD-10-CM | POA: Diagnosis not present

## 2015-01-15 DIAGNOSIS — I2510 Atherosclerotic heart disease of native coronary artery without angina pectoris: Secondary | ICD-10-CM | POA: Diagnosis not present

## 2015-01-15 DIAGNOSIS — Z87891 Personal history of nicotine dependence: Secondary | ICD-10-CM | POA: Diagnosis not present

## 2015-01-15 DIAGNOSIS — I480 Paroxysmal atrial fibrillation: Secondary | ICD-10-CM | POA: Diagnosis not present

## 2015-01-15 DIAGNOSIS — Z951 Presence of aortocoronary bypass graft: Secondary | ICD-10-CM | POA: Diagnosis not present

## 2015-01-15 DIAGNOSIS — D649 Anemia, unspecified: Secondary | ICD-10-CM | POA: Diagnosis not present

## 2015-01-16 ENCOUNTER — Ambulatory Visit (INDEPENDENT_AMBULATORY_CARE_PROVIDER_SITE_OTHER): Payer: Medicare Other | Admitting: Pharmacist

## 2015-01-16 ENCOUNTER — Other Ambulatory Visit: Payer: Medicare Other

## 2015-01-16 ENCOUNTER — Telehealth: Payer: Self-pay | Admitting: Pharmacist

## 2015-01-16 DIAGNOSIS — I10 Essential (primary) hypertension: Secondary | ICD-10-CM

## 2015-01-16 DIAGNOSIS — I482 Chronic atrial fibrillation, unspecified: Secondary | ICD-10-CM

## 2015-01-16 DIAGNOSIS — I4891 Unspecified atrial fibrillation: Secondary | ICD-10-CM

## 2015-01-16 DIAGNOSIS — D509 Iron deficiency anemia, unspecified: Secondary | ICD-10-CM | POA: Diagnosis not present

## 2015-01-16 DIAGNOSIS — D649 Anemia, unspecified: Secondary | ICD-10-CM

## 2015-01-16 LAB — POCT INR: INR: 1.8

## 2015-01-16 LAB — POCT CBC
Granulocyte percent: 76.4 %G (ref 37–80)
HEMATOCRIT: 37.9 % — AB (ref 43.5–53.7)
HEMOGLOBIN: 11.9 g/dL — AB (ref 14.1–18.1)
Lymph, poc: 1.6 (ref 0.6–3.4)
MCH: 29.3 pg (ref 27–31.2)
MCHC: 31.4 g/dL — AB (ref 31.8–35.4)
MCV: 93.3 fL (ref 80–97)
MPV: 7.7 fL (ref 0–99.8)
POC GRANULOCYTE: 8.7 — AB (ref 2–6.9)
POC LYMPH %: 14 % (ref 10–50)
Platelet Count, POC: 521 10*3/uL — AB (ref 142–424)
RBC: 4.06 M/uL — AB (ref 4.69–6.13)
RDW, POC: 14 %
WBC: 11.4 10*3/uL — AB (ref 4.6–10.2)

## 2015-01-16 NOTE — Addendum Note (Signed)
Addended by: Earlene Plater on: 01/16/2015 09:47 AM   Modules accepted: Orders

## 2015-01-16 NOTE — Telephone Encounter (Signed)
Opened in error

## 2015-01-16 NOTE — Patient Instructions (Signed)
Anticoagulation Dose Instructions as of 01/16/2015      [name] [name] Tue Wed Thu Fri Sat   New Dose 2.5 mg 5 mg 5 mg 5 mg 2.5 mg 5 mg 5 mg    Description        Goal INR 1.8-2.0 Continue warfarin 1/2 tablet on sundays and thursdays.  Take 1 tablet all other days.          INR was 1.8 today

## 2015-01-16 NOTE — Telephone Encounter (Signed)
Was out of office 01/15/15.  Called patient this am 01/16/15.   He will come in for protime today.

## 2015-01-16 NOTE — Progress Notes (Signed)
Lab only 

## 2015-01-17 DIAGNOSIS — J449 Chronic obstructive pulmonary disease, unspecified: Secondary | ICD-10-CM | POA: Diagnosis not present

## 2015-01-17 DIAGNOSIS — J45998 Other asthma: Secondary | ICD-10-CM | POA: Diagnosis not present

## 2015-01-17 LAB — BMP8+EGFR
BUN/Creatinine Ratio: 14 (ref 10–22)
BUN: 17 mg/dL (ref 8–27)
CHLORIDE: 96 mmol/L — AB (ref 97–108)
CO2: 27 mmol/L (ref 18–29)
Calcium: 9.7 mg/dL (ref 8.6–10.2)
Creatinine, Ser: 1.23 mg/dL (ref 0.76–1.27)
GFR calc non Af Amer: 57 mL/min/{1.73_m2} — ABNORMAL LOW (ref >59)
GFR, EST AFRICAN AMERICAN: 66 mL/min/{1.73_m2} (ref >59)
GLUCOSE: 99 mg/dL (ref 65–99)
Potassium: 4.2 mmol/L (ref 3.5–5.2)
Sodium: 138 mmol/L (ref 134–144)

## 2015-01-20 ENCOUNTER — Encounter: Payer: Self-pay | Admitting: Family Medicine

## 2015-01-20 ENCOUNTER — Ambulatory Visit (INDEPENDENT_AMBULATORY_CARE_PROVIDER_SITE_OTHER): Payer: Medicare Other | Admitting: Family Medicine

## 2015-01-20 VITALS — BP 131/63 | HR 83 | Temp 98.1°F | Ht 70.5 in | Wt 202.0 lb

## 2015-01-20 DIAGNOSIS — K830 Cholangitis: Secondary | ICD-10-CM

## 2015-01-20 DIAGNOSIS — K8001 Calculus of gallbladder with acute cholecystitis with obstruction: Secondary | ICD-10-CM | POA: Diagnosis not present

## 2015-01-20 DIAGNOSIS — K8309 Other cholangitis: Secondary | ICD-10-CM

## 2015-01-20 DIAGNOSIS — R830 Abnormal level of enzymes in cerebrospinal fluid: Secondary | ICD-10-CM | POA: Diagnosis not present

## 2015-01-20 LAB — POCT CBC
Granulocyte percent: 77.1 %G (ref 37–80)
HCT, POC: 34.2 % — AB (ref 43.5–53.7)
Hemoglobin: 10.7 g/dL — AB (ref 14.1–18.1)
Lymph, poc: 1.6 (ref 0.6–3.4)
MCH, POC: 29.2 pg (ref 27–31.2)
MCHC: 31.3 g/dL — AB (ref 31.8–35.4)
MCV: 93.2 fL (ref 80–97)
MPV: 7.2 fL (ref 0–99.8)
PLATELET COUNT, POC: 484 10*3/uL — AB (ref 142–424)
POC Granulocyte: 8.6 — AB (ref 2–6.9)
POC LYMPH %: 14.3 % (ref 10–50)
RBC: 3.67 M/uL — AB (ref 4.69–6.13)
RDW, POC: 14.1 %
WBC: 11.1 10*3/uL — AB (ref 4.6–10.2)

## 2015-01-20 MED ORDER — CIPROFLOXACIN HCL 500 MG PO TABS: 500.0000 mg | ORAL_TABLET | Freq: Two times a day (BID) | ORAL | Status: DC

## 2015-01-20 NOTE — Progress Notes (Addendum)
Subjective:    Patient ID: Anan LADDIE MATH, male    DOB: 1941/09/04, 74 y.o.   MRN: [id number]  HPI   Chief Complaint  Patient presents with  . Hospitalization Follow-up    gallbladder disease with elevated WBC   Patient seen in office for follow-up of hospitalization based on infection of gallbladder. He had a tube placed in the duct percutaneously and drained copious amounts of pus and multiple gallstones. Unfortunately this plugged up after several days and he went back they removed the tube and are planning gallbladder surgery for 8 days from now. Currently he is been taking Cipro but has run out of that medicine. He asymptomatic and his appetite has returned. He is following a low-fat diet.  Patient Active Problem List   Diagnosis Date Noted  . CKD (chronic kidney disease), stage III 01/06/2015  . Preoperative clearance 12/26/2014  . Elevated LFTs 12/25/2014  . AP (abdominal pain) 12/25/2014  . Acute cholecystitis 12/25/2014  . Cholecystitis 12/25/2014  . Fever   . Abdominal pain 11/26/2014  . Acute renal failure 11/26/2014  . Left leg pain 11/26/2014  . 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 Abnormal nuclear stress test 07/09/2014  . 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 Coronary atherosclerosis of native coronary artery 06/04/2014  . Dyspnea 07/18/2013  . Weakness generalized 07/16/2013  . Chills 07/16/2013  . Chest congestion 07/16/2013  . Atrial fibrillation, chronic 01/16/2013  . Gout 12/16/2012  . Hyperlipemia 12/16/2012  . Gastric antral vascular ectasia   . 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 Anemia, unspecified 11/29/2011  . Nonspecific abnormal finding in stool contents 11/29/2011  . Heme positive stool 11/26/2011  . Anemia 11/26/2011  . Chronic systolic CHF (congestive heart failure) 11/26/2011  . Anticoagulant long-term use 11/26/2011  . Hx of gastroesophageal reflux (GERD) 11/26/2011  . Obesity  11/26/2011  . 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 Iron deficiency anemia 05/13/2010  . COAGULOPATHY 05/13/2010  . 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 GERD 05/13/2010  . 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 Essential hypertension 02/03/2008   Outpatient Encounter Prescriptions as of 01/20/2015  Medication Sig  . acetaminophen (TYLENOL) 500 MG tablet Take 1,000 mg by mouth every 6 (six) hours as needed. For pain.   Marland Kitchen albuterol (PROVENTIL HFA;VENTOLIN HFA) 108 (90 BASE) MCG/ACT inhaler Inhale 2 puffs into the lungs every 6 (six) hours as needed. For shortness of breath.  Marland Kitchen aspirin EC 81 MG tablet Take 1 tablet (81 mg total) by mouth every Monday, Wednesday, and Friday.  . budesonide (PULMICORT) 0.25 MG/2ML nebulizer solution Take 0.25 mg by nebulization 2 (two) times daily.   . Cholecalciferol (VITAMIN D3) 5000 UNITS CAPS Take 5,000 Units by mouth daily.   Marland Kitchen docusate sodium (COLACE) 100 MG capsule Take 1 capsule (100 mg total) by mouth 2 (two) times daily.  Marland Kitchen esomeprazole (NEXIUM) 40 MG capsule Take twice daily on an empty stomach  . ferrous sulfate 325 (65 FE) MG tablet Take 325 mg by mouth 2 (two) times daily with a meal.   . furosemide (LASIX) 40 MG tablet Take 1 tablet (40 mg total) by mouth daily.  . hydrALAZINE (APRESOLINE) 25 MG tablet Take 1 tablet (25 mg total) by mouth 3 (three) times daily.  Marland Kitchen HYDROcodone-acetaminophen (NORCO/VICODIN) 5-325 MG per tablet Take 1 tablet by mouth every 6 (six) hours as needed for moderate pain.  Marland Kitchen ipratropium-albuterol (DUONEB) 0.5-2.5 (3) MG/3ML SOLN Take  3 mLs by nebulization every 6 (six) hours as needed. For shortness of breath.   . isosorbide-hydrALAZINE (BIDIL) 20-37.5 MG per tablet Take 1 tablet by mouth 3 (three) times daily.  Marland Kitchen levothyroxine (SYNTHROID, LEVOTHROID) 112 MCG tablet Take 1 tablet (112 mcg total) by mouth daily before breakfast.  . lovastatin (MEVACOR) 40 MG tablet Take 1 tablet (40 mg total) by mouth at  bedtime.  . Multiple Vitamins-Minerals (MULTIVITAMINS THER. W/MINERALS) TABS Take 1 tablet by mouth daily.    . nitroGLYCERIN (NITROSTAT) 0.4 MG SL tablet Place 0.4 mg under the tongue every 5 (five) minutes as needed for chest pain.  Marland Kitchen oxymetazoline (AFRIN) 0.05 % nasal spray Place 1 spray into both nostrils 2 (two) times daily as needed for congestion.  . potassium chloride (K-DUR) 10 MEQ tablet Take 1 tablet (10 mEq total) by mouth daily.  . sodium chloride (OCEAN) 0.65 % SOLN nasal spray Place 1 spray into both nostrils as needed for congestion.  Marland Kitchen warfarin (COUMADIN) 5 MG tablet TAKE 1 TABLET BY MOUTH EVERY DAY OR AS DIRECTED BY ANTICOAGULATION CLINIC  . [DISCONTINUED] warfarin (COUMADIN) 5 MG tablet Take 2.5-5 mg by mouth daily. Take 5 mg Monday, Tuesday, Wednesday, Friday, and Sat. Take 2.5 mg on Thursday and Sunday  . ciprofloxacin (CIPRO) 500 MG tablet Take 1 tablet (500 mg total) by mouth 2 (two) times daily.  . [DISCONTINUED] ciprofloxacin (CIPRO) 500 MG tablet Take 1 tablet (500 mg total) by mouth 2 (two) times daily.      Review of Systems  Constitutional: Negative for fever, chills, diaphoresis and unexpected weight change.  HENT: Negative for congestion, hearing loss, rhinorrhea, sore throat and trouble swallowing.   Respiratory: Negative for cough, chest tightness, shortness of breath and wheezing.   Gastrointestinal: Positive for abdominal distention. Negative for nausea, vomiting, abdominal pain (resolved but severe in the recent past.), diarrhea and constipation.  Endocrine: Negative for cold intolerance and heat intolerance.  Genitourinary: Negative for dysuria, hematuria and flank pain.  Musculoskeletal: Negative for joint swelling and arthralgias.  Skin: Negative for rash.  Neurological: Negative for dizziness and headaches.  Psychiatric/Behavioral: Negative for dysphoric mood, decreased concentration and agitation. The patient is not nervous/anxious.          Objective:   Physical Exam  Constitutional: He is oriented to person, place, and time. He appears well-developed and well-nourished. No distress.  HENT:  Head: Normocephalic and atraumatic.  Right Ear: External ear normal.  Left Ear: External ear normal.  Nose: Nose normal.  Mouth/Throat: Oropharynx is clear and moist.  Eyes: Conjunctivae and EOM are normal. Pupils are equal, round, and reactive to light.  Neck: Normal range of motion. Neck supple. No thyromegaly present.  Cardiovascular: Normal rate, regular rhythm and normal heart sounds.   No murmur heard. Pulmonary/Chest: Effort normal and breath sounds normal. No respiratory distress. He has no wheezes. He has no rales.  Abdominal: Soft. Bowel sounds are normal. He exhibits no distension. There is tenderness (minimal RUQ. ).  There are 2 puncture wounds in the right flank area at the anterior axillary line from previous placement of the gallbladder drain.  Lymphadenopathy:    He has no cervical adenopathy.  Neurological: He is alert and oriented to person, place, and time. He has normal reflexes.  Skin: Skin is warm and dry.  Psychiatric: He has a normal mood and affect. His behavior is normal. Judgment and thought content normal.    BP 131/63 mmHg  Pulse 83  Temp(Src) 98.1  F (36.7 C) (Oral)  Ht 5' 10.5" (1.791 m)  Wt 202 lb (91.627 kg)  BMI 28.56 kg/m2       Assessment & Plan:   1. Ascending cholangitis   2. Calculus of gallbladder with acute cholecystitis and obstruction     Meds ordered this encounter  Medications  . ciprofloxacin (CIPRO) 500 MG tablet    Sig: Take 1 tablet (500 mg total) by mouth 2 (two) times daily.    Dispense:  20 tablet    Refill:  0    Orders Placed This Encounter  Procedures  . CMP14+EGFR  . POCT CBC    Labs pending Health Maintenance reviewed Diet and exercise encouraged Continue all meds as discussed Follow up in 65mos  Claretta Fraise, MD

## 2015-01-21 LAB — CMP14+EGFR
ALBUMIN: 3.8 g/dL (ref 3.5–4.8)
ALK PHOS: 55 IU/L (ref 39–117)
ALT: 16 IU/L (ref 0–44)
AST: 18 IU/L (ref 0–40)
Albumin/Globulin Ratio: 1.6 (ref 1.1–2.5)
BUN/Creatinine Ratio: 15 (ref 10–22)
BUN: 17 mg/dL (ref 8–27)
Bilirubin Total: 0.2 mg/dL (ref 0.0–1.2)
CALCIUM: 8.9 mg/dL (ref 8.6–10.2)
CO2: 24 mmol/L (ref 18–29)
Chloride: 102 mmol/L (ref 97–108)
Creatinine, Ser: 1.13 mg/dL (ref 0.76–1.27)
GFR calc non Af Amer: 63 mL/min/{1.73_m2} (ref >59)
GFR, EST AFRICAN AMERICAN: 73 mL/min/{1.73_m2} (ref >59)
Globulin, Total: 2.4 g/dL (ref 1.5–4.5)
Glucose: 94 mg/dL (ref 65–99)
Potassium: 4.9 mmol/L (ref 3.5–5.2)
Sodium: 141 mmol/L (ref 134–144)
Total Protein: 6.2 g/dL (ref 6.0–8.5)

## 2015-01-22 ENCOUNTER — Telehealth: Payer: Self-pay | Admitting: *Deleted

## 2015-01-22 ENCOUNTER — Ambulatory Visit (INDEPENDENT_AMBULATORY_CARE_PROVIDER_SITE_OTHER): Payer: Medicare Other | Admitting: Pharmacist

## 2015-01-22 DIAGNOSIS — I482 Chronic atrial fibrillation, unspecified: Secondary | ICD-10-CM

## 2015-01-22 DIAGNOSIS — I129 Hypertensive chronic kidney disease with stage 1 through stage 4 chronic kidney disease, or unspecified chronic kidney disease: Secondary | ICD-10-CM | POA: Diagnosis not present

## 2015-01-22 DIAGNOSIS — I2510 Atherosclerotic heart disease of native coronary artery without angina pectoris: Secondary | ICD-10-CM | POA: Diagnosis not present

## 2015-01-22 DIAGNOSIS — D649 Anemia, unspecified: Secondary | ICD-10-CM | POA: Diagnosis not present

## 2015-01-22 DIAGNOSIS — Z48815 Encounter for surgical aftercare following surgery on the digestive system: Secondary | ICD-10-CM | POA: Diagnosis not present

## 2015-01-22 DIAGNOSIS — Z87891 Personal history of nicotine dependence: Secondary | ICD-10-CM | POA: Diagnosis not present

## 2015-01-22 DIAGNOSIS — I480 Paroxysmal atrial fibrillation: Secondary | ICD-10-CM | POA: Diagnosis not present

## 2015-01-22 DIAGNOSIS — Z951 Presence of aortocoronary bypass graft: Secondary | ICD-10-CM | POA: Diagnosis not present

## 2015-01-22 DIAGNOSIS — Z7901 Long term (current) use of anticoagulants: Secondary | ICD-10-CM | POA: Diagnosis not present

## 2015-01-22 DIAGNOSIS — N183 Chronic kidney disease, stage 3 (moderate): Secondary | ICD-10-CM | POA: Diagnosis not present

## 2015-01-22 LAB — POCT INR: INR: 1.5

## 2015-01-22 NOTE — Telephone Encounter (Signed)
Called [place].  INR was 1.5. Advised to take 1 and 1/2 tablets today then resume 1 tablet daily except 1/2 tablet on Sunday and thursdays. Recheck in 1 week.  [place] notified. [place] notified

## 2015-01-22 NOTE — Telephone Encounter (Signed)
Protime 17.5  INR  Takes 2.5mg  on Thurs and Sat, and 5 mg all other days

## 2015-01-23 LAB — ERYTHROPOIETIN: Erythropoietin: 19 m[IU]/mL — ABNORMAL HIGH (ref 2.6–18.5)

## 2015-01-23 LAB — SPECIMEN STATUS REPORT

## 2015-01-27 ENCOUNTER — Ambulatory Visit: Payer: Medicare Other | Admitting: Internal Medicine

## 2015-01-27 DIAGNOSIS — J449 Chronic obstructive pulmonary disease, unspecified: Secondary | ICD-10-CM | POA: Diagnosis not present

## 2015-01-28 DIAGNOSIS — K811 Chronic cholecystitis: Secondary | ICD-10-CM | POA: Diagnosis not present

## 2015-01-29 ENCOUNTER — Ambulatory Visit (INDEPENDENT_AMBULATORY_CARE_PROVIDER_SITE_OTHER): Payer: Medicare Other | Admitting: Pharmacist

## 2015-01-29 ENCOUNTER — Telehealth: Payer: Self-pay | Admitting: *Deleted

## 2015-01-29 DIAGNOSIS — N183 Chronic kidney disease, stage 3 (moderate): Secondary | ICD-10-CM | POA: Diagnosis not present

## 2015-01-29 DIAGNOSIS — D649 Anemia, unspecified: Secondary | ICD-10-CM | POA: Diagnosis not present

## 2015-01-29 DIAGNOSIS — I482 Chronic atrial fibrillation, unspecified: Secondary | ICD-10-CM

## 2015-01-29 DIAGNOSIS — I129 Hypertensive chronic kidney disease with stage 1 through stage 4 chronic kidney disease, or unspecified chronic kidney disease: Secondary | ICD-10-CM | POA: Diagnosis not present

## 2015-01-29 DIAGNOSIS — Z951 Presence of aortocoronary bypass graft: Secondary | ICD-10-CM | POA: Diagnosis not present

## 2015-01-29 DIAGNOSIS — I2510 Atherosclerotic heart disease of native coronary artery without angina pectoris: Secondary | ICD-10-CM | POA: Diagnosis not present

## 2015-01-29 DIAGNOSIS — I480 Paroxysmal atrial fibrillation: Secondary | ICD-10-CM | POA: Diagnosis not present

## 2015-01-29 DIAGNOSIS — Z7901 Long term (current) use of anticoagulants: Secondary | ICD-10-CM | POA: Diagnosis not present

## 2015-01-29 DIAGNOSIS — Z48815 Encounter for surgical aftercare following surgery on the digestive system: Secondary | ICD-10-CM | POA: Diagnosis not present

## 2015-01-29 DIAGNOSIS — Z87891 Personal history of nicotine dependence: Secondary | ICD-10-CM | POA: Diagnosis not present

## 2015-01-29 LAB — POCT INR: INR: 2

## 2015-01-29 NOTE — Progress Notes (Signed)
No charge for INR / visit because was checked by home health nurse in patient's home.

## 2015-01-29 NOTE — Telephone Encounter (Signed)
INR 2.0, they will be discharging today

## 2015-01-29 NOTE — Telephone Encounter (Signed)
Therapeutic anticoagulation.  Continue current warfarin dose 1 tablet daily except 1.2 tablet on sundays and thursdays.

## 2015-01-29 NOTE — Telephone Encounter (Signed)
Correct to warfarin dose - patient is to continue warfarin 5mg  take 1 tablet daily except 1/2 tablet on sundays and thursdays

## 2015-02-01 ENCOUNTER — Ambulatory Visit (INDEPENDENT_AMBULATORY_CARE_PROVIDER_SITE_OTHER): Payer: Medicare Other | Admitting: Family Medicine

## 2015-02-01 DIAGNOSIS — I129 Hypertensive chronic kidney disease with stage 1 through stage 4 chronic kidney disease, or unspecified chronic kidney disease: Secondary | ICD-10-CM | POA: Diagnosis not present

## 2015-02-01 DIAGNOSIS — Z48815 Encounter for surgical aftercare following surgery on the digestive system: Secondary | ICD-10-CM

## 2015-02-01 DIAGNOSIS — I2510 Atherosclerotic heart disease of native coronary artery without angina pectoris: Secondary | ICD-10-CM | POA: Diagnosis not present

## 2015-02-01 DIAGNOSIS — N183 Chronic kidney disease, stage 3 (moderate): Secondary | ICD-10-CM | POA: Diagnosis not present

## 2015-02-12 ENCOUNTER — Ambulatory Visit (INDEPENDENT_AMBULATORY_CARE_PROVIDER_SITE_OTHER): Payer: Medicare Other | Admitting: Pharmacist

## 2015-02-12 VITALS — Wt 198.0 lb

## 2015-02-12 DIAGNOSIS — I482 Chronic atrial fibrillation, unspecified: Secondary | ICD-10-CM

## 2015-02-12 LAB — POCT INR: INR: 1.6

## 2015-02-12 NOTE — Patient Instructions (Signed)
Anticoagulation Dose Instructions as of 02/12/2015      [name] [name] Tue Wed Thu Fri Sat   New Dose 2.5 mg 5 mg 5 mg 5 mg 2.5 mg 5 mg 5 mg    Description        Goal INR 1.8-2.0 Take 1 and 1/2 tablet today then continue warfarin 1/2 tablet on sundays and thursdays.  Take 1 tablet all other days.         INR was 1.6 today

## 2015-02-17 DIAGNOSIS — J449 Chronic obstructive pulmonary disease, unspecified: Secondary | ICD-10-CM | POA: Diagnosis not present

## 2015-02-26 DIAGNOSIS — J449 Chronic obstructive pulmonary disease, unspecified: Secondary | ICD-10-CM | POA: Diagnosis not present

## 2015-03-11 DIAGNOSIS — K811 Chronic cholecystitis: Secondary | ICD-10-CM | POA: Diagnosis not present

## 2015-03-17 DIAGNOSIS — J449 Chronic obstructive pulmonary disease, unspecified: Secondary | ICD-10-CM | POA: Diagnosis not present

## 2015-03-19 ENCOUNTER — Other Ambulatory Visit: Payer: Self-pay | Admitting: Family Medicine

## 2015-03-19 ENCOUNTER — Ambulatory Visit (INDEPENDENT_AMBULATORY_CARE_PROVIDER_SITE_OTHER): Payer: Medicare Other | Admitting: Pharmacist

## 2015-03-19 DIAGNOSIS — D649 Anemia, unspecified: Secondary | ICD-10-CM | POA: Diagnosis not present

## 2015-03-19 DIAGNOSIS — I482 Chronic atrial fibrillation, unspecified: Secondary | ICD-10-CM

## 2015-03-19 LAB — POCT CBC
Granulocyte percent: 70.1 %G (ref 37–80)
HEMATOCRIT: 36.8 % — AB (ref 43.5–53.7)
HEMOGLOBIN: 11.7 g/dL — AB (ref 14.1–18.1)
Lymph, poc: 1.8 (ref 0.6–3.4)
MCH: 29.9 pg (ref 27–31.2)
MCHC: 31.7 g/dL — AB (ref 31.8–35.4)
MCV: 94.1 fL (ref 80–97)
MPV: 7.6 fL (ref 0–99.8)
POC Granulocyte: 6.1 (ref 2–6.9)
POC LYMPH %: 20.2 % (ref 10–50)
Platelet Count, POC: 278 10*3/uL (ref 142–424)
RBC: 3.91 M/uL — AB (ref 4.69–6.13)
RDW, POC: 15 %
WBC: 8.7 10*3/uL (ref 4.6–10.2)

## 2015-03-19 LAB — POCT INR: INR: 1.6

## 2015-03-19 NOTE — Progress Notes (Signed)
Anticoagulation Dose Instructions as of 03/19/2015      Dorene Grebe Tue Wed Thu Fri Sat   New Dose 2.5 mg 5 mg 5 mg 5 mg 2.5 mg 5 mg 5 mg    Description        Goal INR 1.8-2.0 Take 1 and 1/2 tablet today then continue warfarin 1/2 tablet on sundays and thursdays.  Take 1 tablet all other days.         INR was 1.6 today

## 2015-03-29 DIAGNOSIS — J449 Chronic obstructive pulmonary disease, unspecified: Secondary | ICD-10-CM | POA: Diagnosis not present

## 2015-04-09 NOTE — Progress Notes (Signed)
Cardiology Office Note   Date:  04/10/2015   ID:  Leoti, DOB [birth year], MRN [id number]  PCP:  Claretta Fraise, MD  Cardiologist:  Sinclair Grooms, MD   Chief Complaint  Patient presents with  . Congestive Heart Failure      History of Present Illness: [name] [name] is a 74 y.o. male who presents for chronic atrial fibrillation, coronary artery disease with prior coronary bypass surgery, essential hypertension, chronic kidney disease, chronic GI blood loss, and chronic anticoagulation therapy.  There are no cardiopulmonary complaints other than chronic dyspnea which we feel is pulmonary related. He denies edema. There've been no episodes of orthopnea. He has not had chest pain nor required sublingual nitroglycerin.  Since my last office visit he has had acute cholecystitis but because of his cardiac morbidities, he underwent placement of a cystoscopy to an conservative medical management. At this point he is doing relatively well.    Past Medical History  Diagnosis Date  . Unspecified essential hypertension   . Anemia   . Asthma   . Hepatomegaly   . Esophageal reflux   . Other and unspecified coagulation defects   . Hiatal hernia   . Personal history of colonic polyps 05/29/2010    TUBULAR ADENOMA  . CAD (coronary artery disease)   . Hypothyroidism   . Gout   . Hyperlipidemia   . Gastric antral vascular ectasia 2013  . Atrial fibrillation   . History of blood transfusion ~ 2012    "blood count dropped; had to get 3 units"  . Arthritis     "hips; back" (07/09/2014)  . On home oxygen therapy     "2L at night" (07/09/2014)  . COPD (chronic obstructive pulmonary disease)   . Gallstones     Past Surgical History  Procedure Laterality Date  . Umbilical hernia repair    . Coronary angioplasty with stent placement  ~ 2008; 07/08/2014    "1 + 3"  . 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 Hernia repair    . Umbilical hernia repair  1970's  . Left  and right heart catheterization with coronary/graft angiogram N/A 07/09/2014    Procedure: LEFT AND RIGHT HEART CATHETERIZATION WITH Beatrix Fetters;  Surgeon: Sinclair Grooms, MD;  Location: [facility] CATH LAB;  Service: Cardiovascular;  Laterality: N/A;  . Colonoscopy  2013    Dr. Sharlett Iles: no polyps or evidence of active bleeding  . Esophagogastroduodenoscopy  2013    Dr. 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 Givens capsule study  2013    Dr. Sharlett Iles: AVM at 5 minutes beyond first duodenal image. If persistent IDA, bleeding, recommend enteroscopy with ablation      Current Outpatient Prescriptions  Medication Sig Dispense Refill  . acetaminophen (TYLENOL) 500 MG tablet Take 1,000 mg by mouth every 6 (six) hours as needed. For pain.     Marland Kitchen albuterol (PROVENTIL HFA;VENTOLIN HFA) 108 (90 BASE) MCG/ACT inhaler Inhale 2 puffs into the lungs every 6 (six) hours as needed. For shortness of breath. 1 Inhaler 3  . aspirin EC 81 MG tablet Take 1 tablet (81 mg total) by mouth every Monday, Wednesday, and Friday.    . budesonide (PULMICORT) 0.25 MG/2ML nebulizer solution Take 0.25 mg by nebulization 2 (two) times daily.     . Cholecalciferol (VITAMIN D3) 5000 UNITS CAPS Take 5,000 Units by mouth daily.     Marland Kitchen docusate  sodium (COLACE) 100 MG capsule Take 100 mg by mouth 2 (two) times daily as needed for mild constipation.    Marland Kitchen esomeprazole (NEXIUM) 40 MG capsule Take twice daily on an empty stomach 60 capsule 3  . ferrous sulfate 325 (65 FE) MG tablet Take 325 mg by mouth 2 (two) times daily with a meal.     . furosemide (LASIX) 40 MG tablet Take 1 tablet (40 mg total) by mouth daily. 60 tablet 6  . hydrALAZINE (APRESOLINE) 25 MG tablet Take 1 tablet (25 mg total) by mouth 3 (three) times daily. 90 tablet 6  . ipratropium-albuterol (DUONEB) 0.5-2.5 (3) MG/3ML SOLN Take 3 mLs by nebulization every 6 (six) hours as needed. For shortness of breath.       . isosorbide-hydrALAZINE (BIDIL) 20-37.5 MG per tablet Take 1 tablet by mouth 3 (three) times daily. 90 tablet 11  . levothyroxine (SYNTHROID, LEVOTHROID) 112 MCG tablet TAKE 1 TABLET (112 MCG TOTAL) BY MOUTH DAILY BEFORE BREAKFAST. 30 tablet 7  . lovastatin (MEVACOR) 40 MG tablet Take 1 tablet (40 mg total) by mouth at bedtime. 90 tablet 2  . Multiple Vitamins-Minerals (MULTIVITAMINS THER. W/MINERALS) TABS Take 1 tablet by mouth daily.      . nitroGLYCERIN (NITROSTAT) 0.4 MG SL tablet Place 0.4 mg under the tongue every 5 (five) minutes as needed for chest pain.    . potassium chloride (K-DUR) 10 MEQ tablet Take 1 tablet (10 mEq total) by mouth daily. 30 tablet 5  . warfarin (COUMADIN) 5 MG tablet TAKE 1 TABLET BY MOUTH EVERY DAY OR AS DIRECTED BY ANTICOAGULATION CLINIC 30 tablet 2   No current facility-administered medications for this visit.    Allergies:   Review of patient's allergies indicates no known allergies.    Social History:  The patient  reports that he quit smoking about 18 years ago. His smoking use included Cigarettes. He has a 42 pack-year smoking history. He has never used smokeless tobacco. He reports that he drinks about 0.6 oz of alcohol per week. He reports that he does not use illicit drugs.   Family History:  The patient's family history includes Cancer in his father; Early death in his brother; Heart attack in his brother; Heart disease in his father; Hypertension in an other family member; Kidney disease in his mother; Leukemia in his mother; Stomach cancer in his sister and sister. There is no history of Colon cancer.    ROS:  Please see the history of present illness.   Otherwise, review of systems are positive for muscle pain, and leg pain..   All other systems are reviewed and negative.    PHYSICAL EXAM: VS:  BP 174/60 mmHg  Pulse 70  Ht 5' 10.5" (1.791 m)  Wt 90.175 kg (198 lb 12.8 oz)  BMI 28.11 kg/m2  SpO2 94% , BMI Body mass index is 28.11  kg/(m^2). GEN: Well nourished, well developed, in no acute distress HEENT: normal Neck: no JVD, carotid bruits, or masses Cardiac: RRR; no murmurs, rubs, or gallops,no edema  Respiratory:  clear to auscultation bilaterally, normal work of breathing GI: soft, nontender, nondistended, + BS MS: no deformity or atrophy Skin: warm and dry, no rash Neuro:  Strength and sensation are intact Psych: euthymic mood, full affect   EKG:  EKG is not ordered today.    Recent Labs: 06/04/2014: Pro B Natriuretic peptide (BNP) 147.0* 07/10/2014: Magnesium 2.1 11/26/2014: TSH 7.666* 01/08/2015: Platelets 349 01/20/2015: ALT 16; BUN 17; Creatinine, Ser 1.13; Potassium  4.9; Sodium 141 03/19/2015: Hemoglobin 11.7*    Lipid Panel    Component Value Date/Time   CHOL 137 12/23/2014 1124   CHOL 138 07/05/2014 0937   TRIG 137 12/23/2014 1124   HDL 46 12/23/2014 1124   HDL 37.60* 07/05/2014 0937   CHOLHDL 3.0 12/23/2014 1124   CHOLHDL 4 07/05/2014 0937   VLDL 29.4 07/05/2014 0937   LDLCALC 64 12/23/2014 1124   LDLCALC 71 07/05/2014 0937      Wt Readings from Last 3 Encounters:  04/10/15 90.175 kg (198 lb 12.8 oz)  02/12/15 89.812 kg (198 lb)  01/20/15 91.627 kg (202 lb)      Other studies Reviewed: Additional studies/ records that were reviewed today include: .    ASSESSMENT AND PLAN:  1. Atrial fibrillation, chronic Controlled rate  2. Chronic systolic CHF (congestive heart failure) No evidence of volume overload  3. CKD (chronic kidney disease), stage III Presumed stable  4. Iron deficiency anemia due to chronic blood loss Hemoglobin of 10.83 months ago. Despite being followed by Dr. Laurance Flatten  5. Essential hypertension Borderline systolic control   No change in current medical regimen. Clinical follow-up with Dr. Tamala Julian in 9-12 months. Continue to monitor for anemia.

## 2015-04-10 ENCOUNTER — Ambulatory Visit (INDEPENDENT_AMBULATORY_CARE_PROVIDER_SITE_OTHER): Payer: Medicare Other | Admitting: Interventional Cardiology

## 2015-04-10 ENCOUNTER — Encounter: Payer: Self-pay | Admitting: Interventional Cardiology

## 2015-04-10 ENCOUNTER — Other Ambulatory Visit: Payer: Medicare Other

## 2015-04-10 VITALS — BP 174/60 | HR 70 | Ht 70.5 in | Wt 198.8 lb

## 2015-04-10 DIAGNOSIS — I5022 Chronic systolic (congestive) heart failure: Secondary | ICD-10-CM | POA: Diagnosis not present

## 2015-04-10 DIAGNOSIS — N183 Chronic kidney disease, stage 3 unspecified: Secondary | ICD-10-CM

## 2015-04-10 DIAGNOSIS — I482 Chronic atrial fibrillation, unspecified: Secondary | ICD-10-CM

## 2015-04-10 DIAGNOSIS — D500 Iron deficiency anemia secondary to blood loss (chronic): Secondary | ICD-10-CM

## 2015-04-10 DIAGNOSIS — I10 Essential (primary) hypertension: Secondary | ICD-10-CM

## 2015-04-10 DIAGNOSIS — Z7901 Long term (current) use of anticoagulants: Secondary | ICD-10-CM

## 2015-04-10 NOTE — Patient Instructions (Signed)
Medication Instructions:  Your physician recommends that you continue on your current medications as directed. Please refer to the Current Medication list given to you today.  Labwork: No new orders.  Testing/Procedures: No new orders.   Follow-Up: Your physician wants you to follow-up in: 9-12 MONTHS with Dr Tamala Julian. You will receive a reminder letter in the mail two months in advance. If you don't receive a letter, please call our office to schedule the follow-up appointment.   Any Other Special Instructions Will Be Listed Below (If Applicable).

## 2015-04-16 ENCOUNTER — Telehealth: Payer: Self-pay | Admitting: Family Medicine

## 2015-04-17 ENCOUNTER — Ambulatory Visit (INDEPENDENT_AMBULATORY_CARE_PROVIDER_SITE_OTHER): Payer: Medicare Other | Admitting: Family Medicine

## 2015-04-17 VITALS — Temp 98.0°F | Ht 70.5 in | Wt 200.0 lb

## 2015-04-17 DIAGNOSIS — K810 Acute cholecystitis: Secondary | ICD-10-CM

## 2015-04-17 MED ORDER — HYDROCODONE-ACETAMINOPHEN 5-325 MG PO TABS: 1.0000 | ORAL_TABLET | Freq: Four times a day (QID) | ORAL | Status: DC | PRN

## 2015-04-17 NOTE — Progress Notes (Signed)
Subjective:  Patient ID: [name] [name], male    DOB: [birth year]  Age: 74 y.o. MRN: [id number]  CC: cholecystitis   HPI [name] [name] presents for repeated pain in the right upper extremity with nausea. He has not had any vomiting no fever chills or sweats. He denies any jaundice. There were upper quadrant pain has been intermittent and has been diagnosed as a hypofunctioning gallbladder with delayed emptying. He is considering surgery. Pain is moderate currently. It has been recurrent multiple times over the last several months. Episodes have been self-limited lasting a few days at most. They have not been increasing in frequency or intensity but are quite uncomfortable when they do occur.  History [name] [name] has a past medical history of Unspecified essential hypertension; Anemia; Asthma; Hepatomegaly; Esophageal reflux; Other and unspecified coagulation defects; Hiatal hernia; Personal history of colonic polyps (05/29/2010); CAD (coronary artery disease); Hypothyroidism; Gout; Hyperlipidemia; Gastric antral vascular ectasia (2013); Atrial fibrillation; History of blood transfusion (~ 2012); Arthritis; On home oxygen therapy; COPD (chronic obstructive pulmonary disease); and Gallstones.   He has past surgical history that includes Umbilical hernia repair; Coronary angioplasty with stent (~ 2008; 07/08/2014); Coronary artery bypass graft (1998); Hernia repair; Umbilical hernia repair (1970's); left and right heart catheterization with coronary/graft angiogram (N/A, 07/09/2014); Colonoscopy (2013); Esophagogastroduodenoscopy (2013); and Givens capsule study (2013).   His family history includes Cancer in his father; Early death in his brother; Heart attack in his brother; Heart disease in his father; Hypertension in an other family member; Kidney disease in his mother; Leukemia in his mother; Stomach cancer in his sister and sister. There is no history of Colon cancer.He reports that he quit smoking  about 18 years ago. His smoking use included Cigarettes. He has a 42 pack-year smoking history. He has never used smokeless tobacco. He reports that he drinks about 0.6 oz of alcohol per week. He reports that he does not use illicit drugs.  Outpatient Prescriptions Prior to Visit  Medication Sig Dispense Refill  . acetaminophen (TYLENOL) 500 MG tablet Take 1,000 mg by mouth every 6 (six) hours as needed. For pain.     Marland Kitchen aspirin EC 81 MG tablet Take 1 tablet (81 mg total) by mouth every Monday, Wednesday, and Friday.    . budesonide (PULMICORT) 0.25 MG/2ML nebulizer solution Take 0.25 mg by nebulization 2 (two) times daily.     . Cholecalciferol (VITAMIN D3) 5000 UNITS CAPS Take 5,000 Units by mouth daily.     Marland Kitchen docusate sodium (COLACE) 100 MG capsule Take 100 mg by mouth 2 (two) times daily as needed for mild constipation.    Marland Kitchen esomeprazole (NEXIUM) 40 MG capsule Take twice daily on an empty stomach 60 capsule 3  . ferrous sulfate 325 (65 FE) MG tablet Take 325 mg by mouth 2 (two) times daily with a meal.     . hydrALAZINE (APRESOLINE) 25 MG tablet Take 1 tablet (25 mg total) by mouth 3 (three) times daily. 90 tablet 6  . ipratropium-albuterol (DUONEB) 0.5-2.5 (3) MG/3ML SOLN Take 3 mLs by nebulization every 6 (six) hours as needed. For shortness of breath.     . Multiple Vitamins-Minerals (MULTIVITAMINS THER. W/MINERALS) TABS Take 1 tablet by mouth daily.      . nitroGLYCERIN (NITROSTAT) 0.4 MG SL tablet Place 0.4 mg under the tongue every 5 (five) minutes as needed for chest pain.    . potassium chloride (K-DUR) 10 MEQ tablet Take 1 tablet (10 mEq total) by mouth daily.  30 tablet 5  . albuterol (PROVENTIL HFA;VENTOLIN HFA) 108 (90 BASE) MCG/ACT inhaler Inhale 2 puffs into the lungs every 6 (six) hours as needed. For shortness of breath. 1 Inhaler 3  . furosemide (LASIX) 40 MG tablet Take 1 tablet (40 mg total) by mouth daily. 60 tablet 6  . isosorbide-hydrALAZINE (BIDIL) 20-37.5 MG per tablet  Take 1 tablet by mouth 3 (three) times daily. 90 tablet 11  . levothyroxine (SYNTHROID, LEVOTHROID) 112 MCG tablet TAKE 1 TABLET (112 MCG TOTAL) BY MOUTH DAILY BEFORE BREAKFAST. 30 tablet 7  . lovastatin (MEVACOR) 40 MG tablet Take 1 tablet (40 mg total) by mouth at bedtime. 90 tablet 2  . warfarin (COUMADIN) 5 MG tablet TAKE 1 TABLET BY MOUTH EVERY DAY OR AS DIRECTED BY ANTICOAGULATION CLINIC 30 tablet 2   No facility-administered medications prior to visit.    ROS Review of Systems  Constitutional: Negative for fever, chills and diaphoresis.  HENT: Negative for congestion, rhinorrhea and sore throat.   Respiratory: Negative for cough, shortness of breath and wheezing.   Cardiovascular: Negative for chest pain.  Gastrointestinal: Positive for nausea, abdominal pain and abdominal distention. Negative for vomiting, diarrhea and constipation.  Genitourinary: Negative for dysuria and frequency.  Musculoskeletal: Negative for joint swelling and arthralgias.  Skin: Negative for rash.  Neurological: Negative for headaches.    Objective:  Temp(Src) 98 F (36.7 C) (Oral)  Ht 5' 10.5" (1.791 m)  Wt 200 lb (90.719 kg)  BMI 28.28 kg/m2  BP Readings from Last 3 Encounters:  04/10/15 174/60  01/20/15 131/63  01/08/15 155/53    Wt Readings from Last 3 Encounters:  04/23/15 198 lb (89.812 kg)  04/17/15 200 lb (90.719 kg)  04/10/15 198 lb 12.8 oz (90.175 kg)     Physical Exam  Constitutional: He is oriented to person, place, and time. He appears well-developed and well-nourished. No distress.  HENT:  Head: Normocephalic and atraumatic.  Right Ear: External ear normal.  Left Ear: External ear normal.  Nose: Nose normal.  Mouth/Throat: Oropharynx is clear and moist.  Eyes: Conjunctivae and EOM are normal. Pupils are equal, round, and reactive to light.  Neck: Normal range of motion. Neck supple. No thyromegaly present.  Cardiovascular: Normal rate, regular rhythm and normal heart  sounds.   No murmur heard. Pulmonary/Chest: Effort normal and breath sounds normal. No respiratory distress. He has no wheezes. He has no rales.  Abdominal: Soft. Bowel sounds are normal. He exhibits no distension and no mass. There is tenderness. There is guarding. There is no rebound (right upper quadrant).  Lymphadenopathy:    He has no cervical adenopathy.  Neurological: He is alert and oriented to person, place, and time. He has normal reflexes.  Skin: Skin is warm and dry.  Psychiatric: He has a normal mood and affect. His behavior is normal. Judgment and thought content normal.    No results found for: HGBA1C  Lab Results  Component Value Date   WBC 11.4* 04/23/2015   HGB 12.0* 04/23/2015   HCT 325.1* 04/23/2015   PLT 349 01/08/2015   GLUCOSE 94 01/20/2015   CHOL 137 12/23/2014   TRIG 137 12/23/2014   HDL 46 12/23/2014   LDLCALC 64 12/23/2014   ALT 16 01/20/2015   AST 18 01/20/2015   NA 141 01/20/2015   K 4.9 01/20/2015   CL 102 01/20/2015   CREATININE 1.13 01/20/2015   BUN 17 01/20/2015   CO2 24 01/20/2015   TSH 3.410 04/23/2015   PSA  0.78 11/26/2011   INR 1.8 04/23/2015    Ct Abdomen Pelvis Wo Contrast  01/06/2015   CLINICAL DATA:  Cholecystitis.  EXAM: CT ABDOMEN AND PELVIS WITHOUT CONTRAST  TECHNIQUE: Multidetector CT imaging of the abdomen and pelvis was performed following the standard protocol without IV contrast.  COMPARISON:  CT scan of November 26, 2014.  FINDINGS: Mild multilevel degenerative disc disease is noted in the lumbar spine. No significant abnormality is noted in visualized lung bases.  The spleen and pancreas appear normal. Adrenal glands and kidneys appear normal. Atherosclerotic calcifications of abdominal aorta and iliac arteries are noted without aneurysm formation. Mild prostatic hypertrophy is noted. Urinary bladder appears normal. There is no evidence of bowel obstruction. No renal or ureteral calculi are noted. No hydronephrosis or renal  obstruction is noted.  Gallbladder wall thickening is noted with surrounding fluid suggesting cholecystitis. Fluid is also seen along the lateral margin of the right hepatic lobe. Cholecystostomy drainage catheter is not visualized and presumably has been removed.  IMPRESSION: Gallbladder wall thickening with surrounding fluid is noted suggesting cholecystitis. Fluid is also noted along the lateral margin of the right hepatic lobe. Cholecystostomy drainage catheter is not visualized and presumably has been removed.   Electronically Signed   By: Marijo Conception, M.D.   On: 01/06/2015 20:35   Ir Sinus/fist Tube Chk-non Gi  01/06/2015   CLINICAL DATA:  74 year old with history of cholecystitis and recently placed percutaneous cholecystostomy tube. The catheter is no longer draining and unable to flush the catheter. Patient presents for catheter evaluation.  EXAM: INJECTION AND ATTEMPTED EXCHANGE OF PERCUTANEOUS CHOLECYSTOSTOMY TUBE.  Physician: Stephan Minister. Henn, MD  FLUOROSCOPY TIME:  9 minutes and 6 seconds, 259.4 mGy  MEDICATIONS AND MEDICAL HISTORY: None  ANESTHESIA/SEDATION: Moderate sedation time: None  CONTRAST:  10 mL Omnipaque-300  PROCEDURE: The procedure was explained to the patient. The risks and benefits of the procedure were discussed and the patient's questions were addressed. Informed consent was obtained from the patient. The tube would not flush even with a 3 mL syringe. Unable to inject contrast through the tube. The existing catheter and surrounding skin were prepped and draped in sterile fashion. Maximal barrier sterile technique was utilized including caps, mask, sterile gowns, sterile gloves, sterile drape, hand hygiene and skin antiseptic. The skin around the catheter was anesthetized with 1% lidocaine. The suture was removed. Multiple different wires were advanced through the catheter. There was an abrupt obstruction in the midportion of the catheter. Despite using multiple wires and different  techniques, wire could never be successfully advanced through the catheter. Multiple attempts were made to place a second catheter along the existing catheter. These attempts to place a "parallel catheter" were unsuccessful. As a result, the catheter was slowly pulled out of the patient until the point of obstruction was exposed. The catheter was cut at the point of obstruction but, unfortunately, the remaining catheter was also completely obstructed. Therefore, a wire could be advanced through this catheter. At this point, there was no option but to completely remove the catheter. A 5 French Kumpe catheter was advanced through the track and multiple attempts were made to cannulate the old tract. These attempts were unsuccessful. In addition, the patient was very uncomfortable with any manipulation. Bandage was placed at the site.  FINDINGS: The percutaneous cholecystostomy tube appeared to appropriately positioned based on fluoroscopy. The mid and distal aspect of the catheter were completely obstructed. After the catheter was removed, rock-hard material was identified within  the catheter. Unable to replace a catheter through the old tract. Dark bile was draining from the site.  Estimated blood loss: None  COMPLICATIONS: None  IMPRESSION: Complete obstruction of the percutaneous cholecystostomy tube. The cholecystostomy tube was removed but a new tube could not be placed through the old tract.  Following the procedure, the patient was complaining of increased abdominal pain. Patient was at risk for bile leakage and peritonitis because this was a non-mature cholecystostomy tube tract. These findings were discussed with Dr. Lucia Gaskins of General Surgery. Patient was transferred to the emergency department and later admitted to the hospital.   Electronically Signed   By: Markus Daft M.D.   On: 01/06/2015 17:50   Ct Image Guided Fluid Drain By Catheter  01/07/2015   INDICATION: History of biliary dyskinesia complicated by  acute cholecystitis. Patient underwent ultrasound fluoroscopic guided cholecystostomy tube placement on 12/28/2014 however returned on 01/06/2015 for evaluation due to inability to flush the cholecystostomy tube.  The cholecystostomy tube was unable to be exchanged secondary to complete tube exclusion and access to the gallbladder fossa was ultimately loss during the attempted exchange.  Replaced made for placement of a new percutaneous cholecystostomy tube.  EXAM: ATTEMPTED ULTRASOUND AND CT-GUIDED CHOLECYSTOSTOMY TUBE PLACEMENT  COMPARISON:  Ultrasound fluoroscopic guided cholecystostomy tube placement - 12/28/2014; attempted fluoroscopic guided cholecystostomy tube exchange - 01/06/2015; CT abdomen and pelvis - 01/06/2015  MEDICATIONS: The patient is currently admitted to the hospital and receiving intravenous antibiotics. The antibiotics were administered within an appropriate time frame prior to the initiation of the procedure.  ANESTHESIA/SEDATION: Fentanyl 250 mcg IV; Versed 5 mg IV  Total Moderate Sedation time  50 minutes  CONTRAST:  None  COMPLICATIONS: None immediate  PROCEDURE: Informed written consent was obtained from the patient after a discussion of the risks, benefits and alternatives to treatment. The patient was placed supine on the CT gantry and a pre procedural CT was performed re- demonstrated underdistention of the gallbladder with a very minimal amount of pericholecystic fluid.  The procedure was planned. A timeout was performed prior to the initiation of the procedure. The right lateral upper abdomen was prepped and draped in the usual sterile fashion. The overlying soft tissues were anesthetized with 1% lidocaine with epinephrine.  Initially, trajectory was planned with the use of 22 gauge spinal needle under direct ultrasound guidance. Appropriate positioning was confirmed and 18 gauge trocar needle was advanced into the gallbladder lumen also under direct ultrasound guidance.  Note, there  was difficulty advancing the trocar needle through the decompressed patulous wall of the gallbladder however appropriate positioning was ultimately confirmed with a combination of ultrasound and CT guidance.  Next, there is great difficulty coiling the Amplatz wire within the gallbladder lumen secondary to friability of the gallbladder wall. Ultimately, the Amplatz wire was coiled within the neck of the decompressed gallbladder as confirmed with CT imaging (series 10), however despite serial dilatation, a 10 French percutaneous drain would not coil within the gallbladder fossa and as such was subsequently removed.  A dressing was placed. The patient tolerated the attempted procedure well without immediate postprocedural complication.  IMPRESSION: Unsuccessful attempted ultrasound and CT guided placement of a 10 French cholecystostomy tube secondary to gallbladder underdistention and marked friability of the gallbladder wall.  Above discussed with to general surgery PA, [place] at 14:37.   Electronically Signed   By: Sandi Mariscal M.D.   On: 01/07/2015 14:47    Assessment & Plan:   Keon was seen today  for cholecystitis.  Diagnoses and all orders for this visit:  Cholecystitis, acute  Other orders -     HYDROcodone-acetaminophen (NORCO) 5-325 MG per tablet; Take 1 tablet by mouth every 6 (six) hours as needed for moderate pain.  I am having Mr. Barrilleaux start on HYDROcodone-acetaminophen. I am also having him maintain his ipratropium-albuterol, budesonide, multivitamins ther. w/minerals, Vitamin D3, acetaminophen, nitroGLYCERIN, ferrous sulfate, aspirin EC, potassium chloride, hydrALAZINE, esomeprazole, and docusate sodium.  Meds ordered this encounter  Medications  . HYDROcodone-acetaminophen (NORCO) 5-325 MG per tablet    Sig: Take 1 tablet by mouth every 6 (six) hours as needed for moderate pain.    Dispense:  30 tablet    Refill:  0   Due to the recurrent nature of his pain. I suggested that he  look more favorably at surgery. He is a relatively high risk patient due to his cardiac situation. However the need for opiate-based pain medicines on a recurrent basis represents 6 significant risk as well.  Follow-up: Return in about 1 month (around 05/18/2015), or if symptoms worsen or fail to improve.  Claretta Fraise, M.D.

## 2015-04-17 NOTE — Telephone Encounter (Signed)
appt scheduled

## 2015-04-22 ENCOUNTER — Telehealth: Payer: Self-pay | Admitting: Internal Medicine

## 2015-04-22 ENCOUNTER — Other Ambulatory Visit: Payer: Self-pay

## 2015-04-22 MED ORDER — LOVASTATIN 40 MG PO TABS: 40.0000 mg | ORAL_TABLET | Freq: Every day | ORAL | Status: DC

## 2015-04-22 NOTE — Telephone Encounter (Signed)
Pts wife called and states that the pt is having similar symptoms that he did in March/April when he had a drainage tube placed in the gallbladder. They did not remove the gallbladder. Pt wants to know what he should do now. Per note from Dr Lucia Gaskins when he was in the hospital it mentioned if problems came back he may need CT scan or possibly try to place drain again. Also mentioned open removal of gallbladder. Dr. Hilarie Fredrickson please advise.

## 2015-04-22 NOTE — Telephone Encounter (Signed)
Would recommend that he call and follow-up with Dr. Lucia Gaskins given gallbladder issues previously

## 2015-04-22 NOTE — Telephone Encounter (Signed)
Spoke with pts wife and she is aware and knows to call [facility].

## 2015-04-23 ENCOUNTER — Encounter: Payer: Self-pay | Admitting: Pharmacist

## 2015-04-23 ENCOUNTER — Ambulatory Visit (INDEPENDENT_AMBULATORY_CARE_PROVIDER_SITE_OTHER): Payer: Medicare Other | Admitting: Pharmacist

## 2015-04-23 VITALS — Ht 71.0 in | Wt 198.0 lb

## 2015-04-23 DIAGNOSIS — E034 Atrophy of thyroid (acquired): Secondary | ICD-10-CM

## 2015-04-23 DIAGNOSIS — D500 Iron deficiency anemia secondary to blood loss (chronic): Secondary | ICD-10-CM | POA: Diagnosis not present

## 2015-04-23 DIAGNOSIS — E038 Other specified hypothyroidism: Secondary | ICD-10-CM | POA: Diagnosis not present

## 2015-04-23 DIAGNOSIS — I482 Chronic atrial fibrillation, unspecified: Secondary | ICD-10-CM

## 2015-04-23 LAB — POCT CBC
Granulocyte percent: 75 %G (ref 37–80)
HCT, POC: 325.1 % — AB (ref 43.5–53.7)
HEMOGLOBIN: 12 g/dL — AB (ref 14.1–18.1)
Lymph, poc: 2 (ref 0.6–3.4)
MCH: 31.8 pg — AB (ref 27–31.2)
MCHC: 34.1 g/dL (ref 31.8–35.4)
MCV: 93.3 fL (ref 80–97)
MPV: 6.9 fL (ref 0–99.8)
POC Granulocyte: 8.5 — AB (ref 2–6.9)
POC LYMPH PERCENT: 17.6 %L (ref 10–50)
Platelet Count, POC: 429 10*3/uL — AB (ref 142–424)
RBC: 3.76 M/uL — AB (ref 4.69–6.13)
RDW, POC: 14.8 %
WBC: 11.4 10*3/uL — AB (ref 4.6–10.2)

## 2015-04-23 LAB — POCT INR: INR: 1.8

## 2015-04-23 NOTE — Patient Instructions (Signed)
Anticoagulation Dose Instructions as of 04/23/2015      [name] [name] Tue Wed Thu Fri Sat   New Dose 2.5 mg 5 mg 5 mg 5 mg 2.5 mg 5 mg 5 mg    Description        Goal INR 1.8-2.0 Continue warfarin 1/2 tablet on sundays and thursdays.  Take 1 tablet all other days.         INR was 1.8 today

## 2015-04-24 ENCOUNTER — Telehealth: Payer: Self-pay | Admitting: *Deleted

## 2015-04-24 LAB — TSH: TSH: 3.41 u[IU]/mL (ref 0.450–4.500)

## 2015-04-24 NOTE — Telephone Encounter (Signed)
Pt notified of results Verbalizes understanding 

## 2015-04-24 NOTE — Telephone Encounter (Signed)
-----   Message from [place], [place] sent at 04/24/2015  8:17 AM EDT ----- TSH = WNL.  Patient to continue current levothyroxine dose of 160mcg daily.  Recheck thyroid in 6 to 12 months Please notify patient

## 2015-04-24 NOTE — Telephone Encounter (Signed)
-----   Message from [place], [place] sent at 04/24/2015  8:17 AM EDT ----- TSH = WNL.  Patient to continue current levothyroxine dose of 187mcg daily.  Recheck thyroid in 6 to 12 months Please notify patient

## 2015-04-25 ENCOUNTER — Other Ambulatory Visit: Payer: Self-pay | Admitting: *Deleted

## 2015-04-25 DIAGNOSIS — R0602 Shortness of breath: Secondary | ICD-10-CM

## 2015-04-25 MED ORDER — LEVOTHYROXINE SODIUM 112 MCG PO TABS: ORAL_TABLET | ORAL | Status: DC

## 2015-04-25 MED ORDER — FUROSEMIDE 40 MG PO TABS: 40.0000 mg | ORAL_TABLET | Freq: Every day | ORAL | Status: DC

## 2015-04-25 MED ORDER — WARFARIN SODIUM 5 MG PO TABS: ORAL_TABLET | ORAL | Status: DC

## 2015-04-25 MED ORDER — ISOSORB DINITRATE-HYDRALAZINE 20-37.5 MG PO TABS: 1.0000 | ORAL_TABLET | Freq: Three times a day (TID) | ORAL | Status: DC

## 2015-04-25 MED ORDER — ALBUTEROL SULFATE HFA 108 (90 BASE) MCG/ACT IN AERS: 2.0000 | INHALATION_SPRAY | Freq: Four times a day (QID) | RESPIRATORY_TRACT | Status: DC | PRN

## 2015-04-28 DIAGNOSIS — J449 Chronic obstructive pulmonary disease, unspecified: Secondary | ICD-10-CM | POA: Diagnosis not present

## 2015-05-02 DIAGNOSIS — J449 Chronic obstructive pulmonary disease, unspecified: Secondary | ICD-10-CM | POA: Diagnosis not present

## 2015-05-29 ENCOUNTER — Ambulatory Visit (INDEPENDENT_AMBULATORY_CARE_PROVIDER_SITE_OTHER): Payer: Medicare Other | Admitting: Pharmacist

## 2015-05-29 ENCOUNTER — Ambulatory Visit: Payer: Medicare Other | Admitting: Pharmacist

## 2015-05-29 ENCOUNTER — Encounter: Payer: Self-pay | Admitting: Pharmacist

## 2015-05-29 ENCOUNTER — Other Ambulatory Visit: Payer: Self-pay

## 2015-05-29 VITALS — BP 146/60 | HR 62 | Ht 71.0 in | Wt 198.0 lb

## 2015-05-29 DIAGNOSIS — Z0000 Encounter for general adult medical examination without abnormal findings: Secondary | ICD-10-CM

## 2015-05-29 DIAGNOSIS — I482 Chronic atrial fibrillation, unspecified: Secondary | ICD-10-CM

## 2015-05-29 DIAGNOSIS — J449 Chronic obstructive pulmonary disease, unspecified: Secondary | ICD-10-CM | POA: Diagnosis not present

## 2015-05-29 LAB — POCT INR: INR: 2

## 2015-05-29 MED ORDER — ESOMEPRAZOLE MAGNESIUM 40 MG PO CPDR: DELAYED_RELEASE_CAPSULE | ORAL | Status: DC

## 2015-05-29 NOTE — Patient Instructions (Addendum)
  Mr. Eddie , Thank you for taking time to come for your Medicare Wellness Visit. I appreciate your ongoing commitment to your health goals. Please review the following plan we discussed and let me know if I can assist you in the future.    This is a list of the screening recommended for you and due dates:  Health Maintenance  Topic Date Due  . Pneumonia vaccines (2 of 2 - PPSV23) 04/27/2015  . Flu Shot  05/05/2015  . Shingles Vaccine  07/18/2015*  . Tetanus Vaccine  07/18/2015*  . Colon Cancer Screening  11/28/2021  *Topic was postponed. The date shown is not the original due date.   Anticoagulation Dose Instructions as of 05/29/2015      Dorene Grebe Tue Wed Thu Fri Sat   New Dose 2.5 mg 5 mg 5 mg 5 mg 2.5 mg 5 mg 5 mg    Description        Goal INR 1.8-2.0 Continue warfarin 1/2 tablet on sundays and thursdays.  Take 1 tablet all other days.         INR was 2.0 today

## 2015-05-29 NOTE — Progress Notes (Signed)
Patient ID: [name] [name], male   DOB: [birth year], 74 y.o.   MRN: [id number]    Subjective:   [name] [name] is a 74 y.o. male who presents for a subsequent Medicare Annual Wellness Visit and to recheck INR / atrial fibrillation  [name] [name] is a white male who is retied and lives with his wife in [place], [place].  He has lost about 20lbs over the last 6 months.  He has gallbladder drainage placed 12/2014 instead of cholecystectomy due to high surgical risk.  Patient states that there are several foods he cannot eat because it causes diarrhea or nausea - beef, hotdogs and fried foods.   Current Medications (verified) Outpatient Encounter Prescriptions as of 05/29/2015  Medication Sig  . acetaminophen (TYLENOL) 500 MG tablet Take 1,000 mg by mouth every 6 (six) hours as needed. For pain.   Marland Kitchen albuterol (PROVENTIL HFA;VENTOLIN HFA) 108 (90 BASE) MCG/ACT inhaler Inhale 2 puffs into the lungs every 6 (six) hours as needed. For shortness of breath.  Marland Kitchen aspirin EC 81 MG tablet Take 1 tablet (81 mg total) by mouth every Monday, Wednesday, and Friday.  . budesonide (PULMICORT) 0.25 MG/2ML nebulizer solution Take 0.25 mg by nebulization 2 (two) times daily.   . Cholecalciferol (VITAMIN D3) 5000 UNITS CAPS Take 5,000 Units by mouth daily.   Marland Kitchen docusate sodium (COLACE) 100 MG capsule Take 100 mg by mouth 2 (two) times daily as needed for mild constipation.  Marland Kitchen esomeprazole (NEXIUM) 40 MG capsule Take twice daily on an empty stomach  . ferrous sulfate 325 (65 FE) MG tablet Take 325 mg by mouth 2 (two) times daily with a meal.   . furosemide (LASIX) 40 MG tablet Take 1 tablet (40 mg total) by mouth daily.  . hydrALAZINE (APRESOLINE) 25 MG tablet Take 1 tablet (25 mg total) by mouth 3 (three) times daily.  Marland Kitchen HYDROcodone-acetaminophen (NORCO) 5-325 MG per tablet Take 1 tablet by mouth every 6 (six) hours as needed for moderate pain.  Marland Kitchen ipratropium-albuterol (DUONEB) 0.5-2.5 (3) MG/3ML SOLN Take 3 mLs by nebulization  every 6 (six) hours as needed. For shortness of breath.   . isosorbide-hydrALAZINE (BIDIL) 20-37.5 MG per tablet Take 1 tablet by mouth 3 (three) times daily.  Marland Kitchen levothyroxine (SYNTHROID, LEVOTHROID) 112 MCG tablet TAKE 1 TABLET (112 MCG TOTAL) BY MOUTH DAILY BEFORE BREAKFAST.  Marland Kitchen lovastatin (MEVACOR) 40 MG tablet Take 1 tablet (40 mg total) by mouth at bedtime.  . Multiple Vitamins-Minerals (MULTIVITAMINS THER. W/MINERALS) TABS Take 1 tablet by mouth daily.    . nitroGLYCERIN (NITROSTAT) 0.4 MG SL tablet Place 0.4 mg under the tongue every 5 (five) minutes as needed for chest pain.  . potassium chloride (K-DUR) 10 MEQ tablet Take 1 tablet (10 mEq total) by mouth daily.  Marland Kitchen warfarin (COUMADIN) 5 MG tablet TAKE 1 TABLET BY MOUTH EVERY DAY OR AS DIRECTED BY ANTICOAGULATION CLINIC   No facility-administered encounter medications on file as of 05/29/2015.    Allergies (verified) Review of patient's allergies indicates no known allergies.   History: Past Medical History  Diagnosis Date  . Unspecified essential hypertension   . Anemia   . Asthma   . Hepatomegaly   . Esophageal reflux   . Other and unspecified coagulation defects   . Hiatal hernia   . Personal history of colonic polyps 05/29/2010    TUBULAR ADENOMA  . CAD (coronary artery disease)   . Hypothyroidism   . Gout   . Hyperlipidemia   .  Gastric antral vascular ectasia 2013  . Atrial fibrillation   . History of blood transfusion ~ 2012    "blood count dropped; had to get 3 units"  . Arthritis     "hips; back" (07/09/2014)  . On home oxygen therapy     "2L at night" (07/09/2014)  . COPD (chronic obstructive pulmonary disease)   . Gallstones    Past Surgical History  Procedure Laterality Date  . Umbilical hernia repair    . Coronary angioplasty with stent placement  ~ 2008; 07/08/2014    "1 + 3"  . 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 Hernia repair    . Umbilical hernia repair  1970's  . Left and right heart  catheterization with coronary/graft angiogram N/A 07/09/2014    Procedure: LEFT AND RIGHT HEART CATHETERIZATION WITH Beatrix Fetters;  Surgeon: Sinclair Grooms, MD;  Location: [facility] CATH LAB;  Service: Cardiovascular;  Laterality: N/A;  . Colonoscopy  2013    Dr. Sharlett Iles: no polyps or evidence of active bleeding  . Esophagogastroduodenoscopy  2013    Dr. 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 Givens capsule study  2013    Dr. Sharlett Iles: AVM at 5 minutes beyond first duodenal image. If persistent IDA, bleeding, recommend enteroscopy with ablation    Family History  Problem Relation Age of Onset  . Leukemia Mother   . Kidney disease Mother     kidney removed   . Colon cancer Neg Hx   . Heart attack Brother   . Hypertension      family   . Cancer Father   . Heart disease Father   . Stomach cancer Sister   . Stomach cancer Sister   . Early death Brother    Social History   Occupational History  . retired    Social History Main Topics  . Smoking status: Former Smoker -- 1.00 packs/day for 42 years    Types: Cigarettes    Quit date: 12/16/1996  . Smokeless tobacco: Never Used  . Alcohol Use: 0.6 oz/week    1 Cans of beer per week     Comment: 07/09/2014 "averages < 1 beer/wk", 11/26/14: denies ETOH  . Drug Use: No  . Sexual Activity: No    Do you feel safe at home?  Yes  Dietary issues and exercise activities: Current Exercise Habits:: The patient does not participate in regular exercise at present  Current Dietary habits:  Recent changes in diet due to gallbladder problems.  Tries to limit fried foods and beef.     Objective:    Today's Vitals   05/29/15 0913  BP: 146/60  Pulse: 62  Height: 5\' 11"  (1.803 m)  Weight: 198 lb (89.812 kg)  PainSc: 2   PainLoc: Abdomen   Body mass index is 27.63 kg/(m^2).   INR was 2.0 today  Activities of Daily Living In your present state of health, do you have any  difficulty performing the following activities: 05/29/2015 01/07/2015  Hearing? N Y  Vision? N N  Difficulty concentrating or making decisions? N N  Walking or climbing stairs? N N  Dressing or bathing? N N  Doing errands, shopping? N N  Preparing Food and eating ? N -  Using the Toilet? N -  In the past six months, have you accidently leaked urine? N -  Do you have problems with loss of bowel control? N -  Managing your  Medications? N -  Managing your Finances? N -  Housekeeping or managing your Housekeeping? N -    Are there smokers in your home (other than you)? No   Cardiac Risk Factors include: advanced age (>27men, >60 women);dyslipidemia;family history of premature cardiovascular disease;hypertension;male gender;sedentary lifestyle  Depression Screen PHQ 2/9 Scores 05/29/2015 12/12/2014 04/26/2014 03/20/2014  PHQ - 2 Score 0 3 0 0  PHQ- 9 Score - 8 - -    Fall Risk Fall Risk  05/29/2015 12/12/2014 04/26/2014 03/20/2014 07/16/2013  Falls in the past year? No No No No No    Cognitive Function: MMSE - Mini Mental State Exam 05/29/2015  Orientation to time 5  Orientation to Place 5  Registration 3  Attention/ Calculation 5  Recall 3  Language- name 2 objects 2  Language- repeat 1  Language- follow 3 step command 3  Language- read & follow direction 1  Write a sentence 1  Copy design 1  Total score 30    Immunizations and Health Maintenance Immunization History  Administered Date(s) Administered  . Influenza Whole 11/18/2010  . Influenza,inj,Quad PF,36+ Mos 07/10/2014  . Pneumococcal Conjugate-13 04/26/2014   Health Maintenance Due  Topic Date Due  . PNA vac Low Risk Adult (2 of 2 - PPSV23) 04/27/2015  . INFLUENZA VACCINE  05/05/2015    Patient Care Team: Claretta Fraise, MD as PCP - General (Family Medicine) Jerene Bears, MD as Consulting Physician (Gastroenterology) Belva Crome, MD as Consulting Physician (Cardiology)  Indicate any recent Medical Services you  may have received from other than [facility] providers in the past year (date may be approximate).    Assessment:    Annual Wellness Visit  Therapeutic Anticoagulation   Screening Tests Health Maintenance  Topic Date Due  . PNA vac Low Risk Adult (2 of 2 - PPSV23) 04/27/2015  . INFLUENZA VACCINE  05/05/2015  . ZOSTAVAX  07/18/2015 (Originally 07/06/1999)  . TETANUS/TDAP  07/18/2015 (Originally 07/05/1958)  . COLONOSCOPY  11/28/2021        Plan:   During the course of the visit [name] [name] was educated and counseled about the following appropriate screening and preventive services:   Vaccines to include Pneumoccal, Influenza, Hepatitis B, Td, Zostavax - checked price on Boostrix - $27.69 and Zostavax $104.06.  Patient decided to wait due to cost.   Colorectal cancer screening - UTD.  I did recommend follow up with GI if continues to lose weight  Cardiovascular disease screening - UTD;  Sees Cardiologist on regular basis  I did ask patient to verify if he was taking hydralazine or Bidil as he was unsure in the office. He called back and he is taking Bidil and not hydralazine.   Diabetes screening - UTD  PSA - UTD  Glaucoma screening / Eye Exam  Nutrition counseling - UTD  Advanced Directives - patient declines  Anticoagulation Dose Instructions as of 05/29/2015      Dorene Grebe Tue Wed Thu Fri Sat   New Dose 2.5 mg 5 mg 5 mg 5 mg 2.5 mg 5 mg 5 mg    Description        Goal INR 1.8-2.0 Continue warfarin 1/2 tablet on sundays and thursdays.  Take 1 tablet all other days.         Recheck INR in 6 weeks  Patient Instructions (the written plan) were given to the patient.   Cherre Robins, [facility]   05/30/2015

## 2015-05-30 ENCOUNTER — Encounter: Payer: Self-pay | Admitting: Pharmacist

## 2015-05-30 ENCOUNTER — Other Ambulatory Visit: Payer: Self-pay | Admitting: *Deleted

## 2015-05-30 ENCOUNTER — Ambulatory Visit: Payer: Self-pay | Admitting: Pharmacist

## 2015-05-30 MED ORDER — POTASSIUM CHLORIDE ER 10 MEQ PO TBCR: 10.0000 meq | EXTENDED_RELEASE_TABLET | Freq: Every day | ORAL | Status: DC

## 2015-05-31 ENCOUNTER — Encounter: Payer: Self-pay | Admitting: Internal Medicine

## 2015-06-24 DIAGNOSIS — J449 Chronic obstructive pulmonary disease, unspecified: Secondary | ICD-10-CM | POA: Diagnosis not present

## 2015-06-26 IMAGING — US US ABDOMEN LIMITED
1 series · 14 of 25 positions shown · non-contrast
Comparison: 11/26/14

CLINICAL DATA: Right upper quadrant pain

EXAM:
US ABDOMEN LIMITED - RIGHT UPPER QUADRANT

[Series 1: us abdomen limited · 0.27mm/px · 14 of 46 slices shown]
[im 1/46]
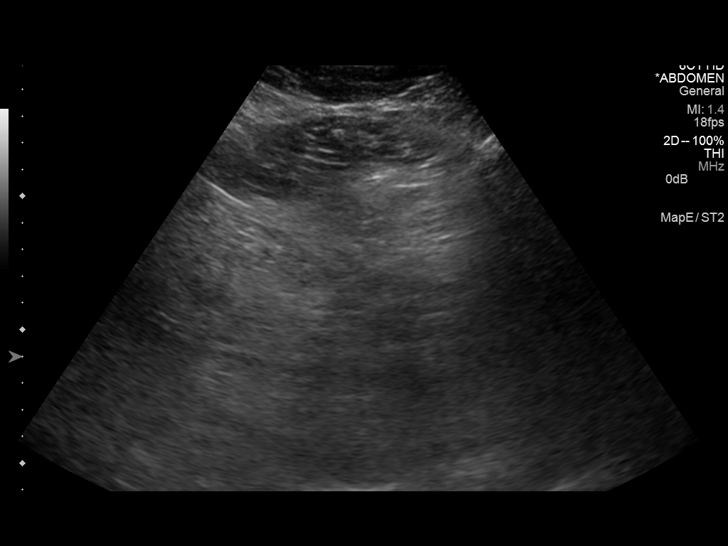
[im 4/46]
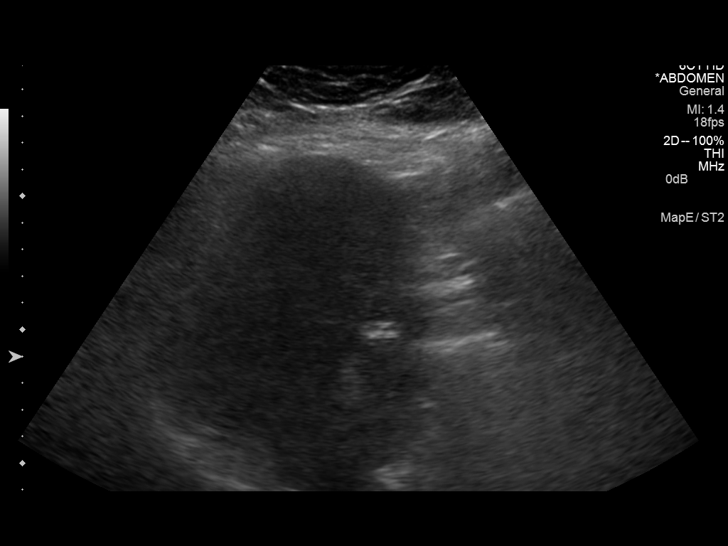
[im 8/46]
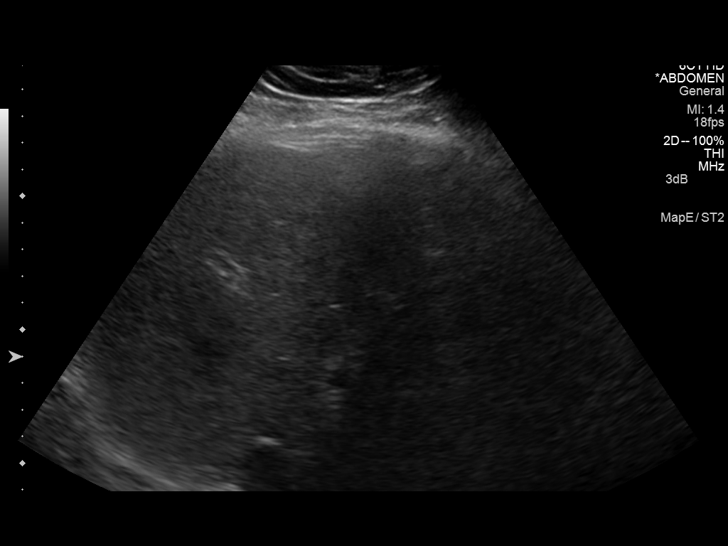
[im 12/46]
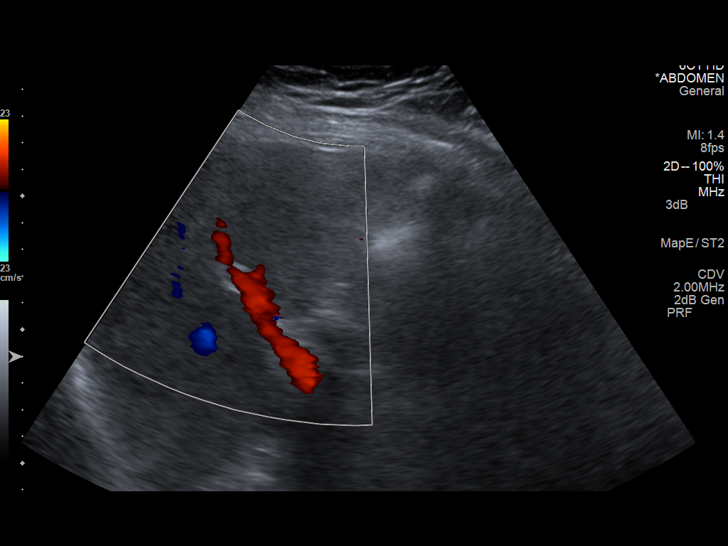
[im 16/46]
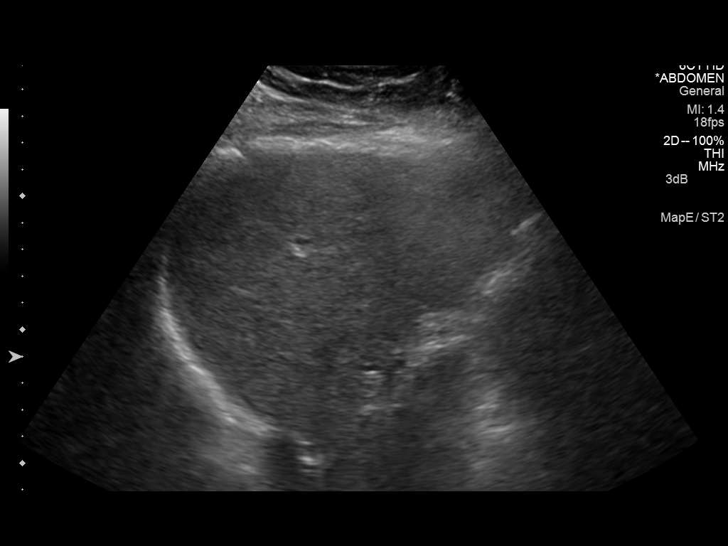
[im 17/46]
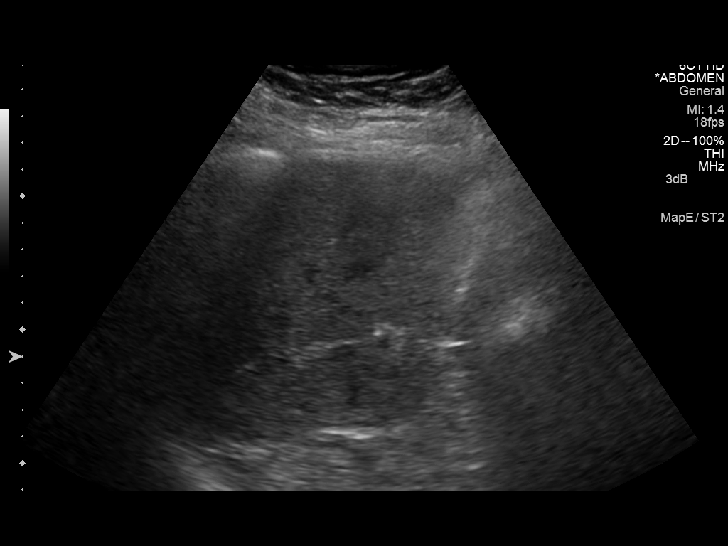
[im 21/46]
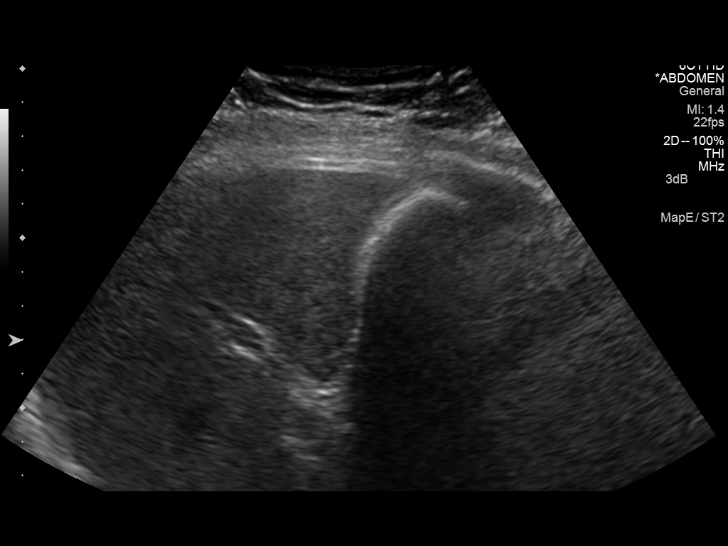
[im 25/46]
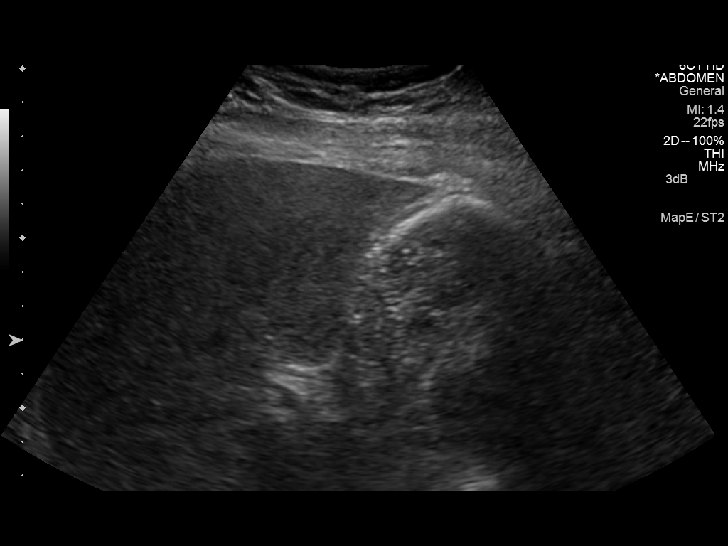
[im 29/46]
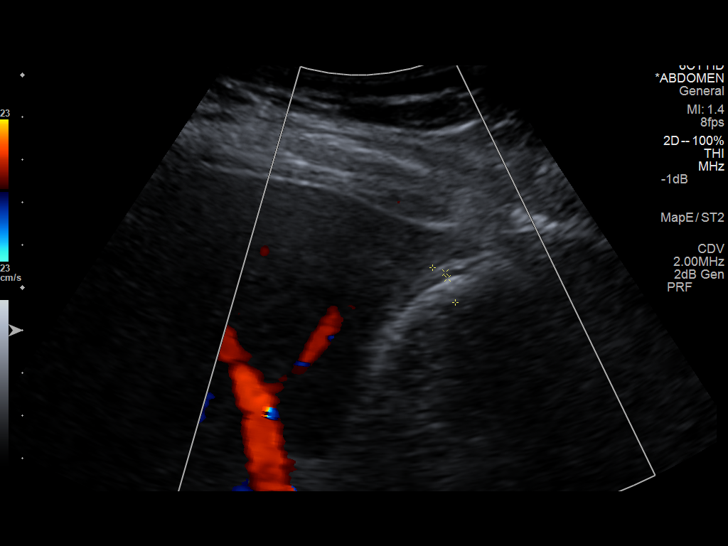
[im 31/46]
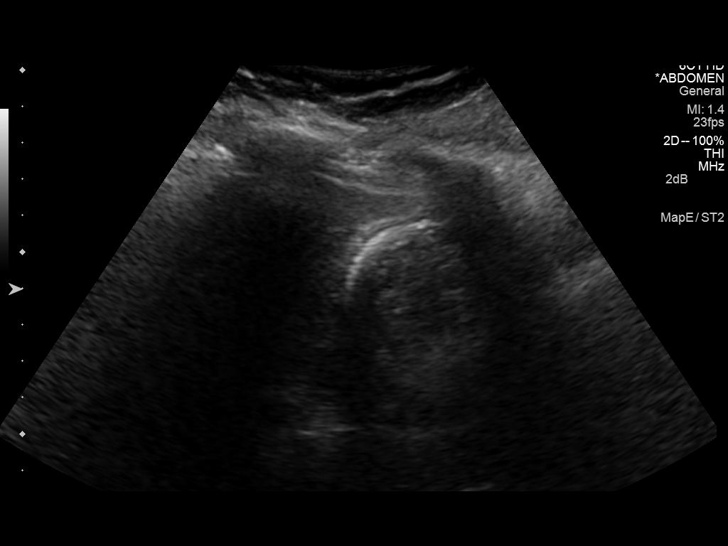
[im 34/46]
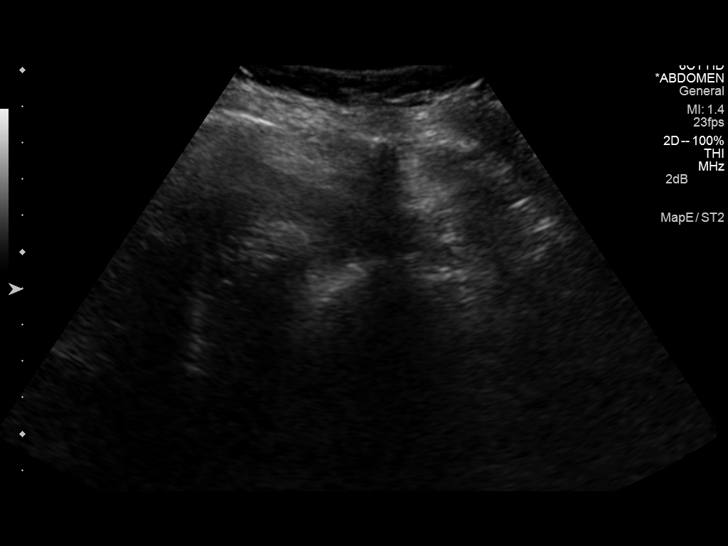
[im 38/46]
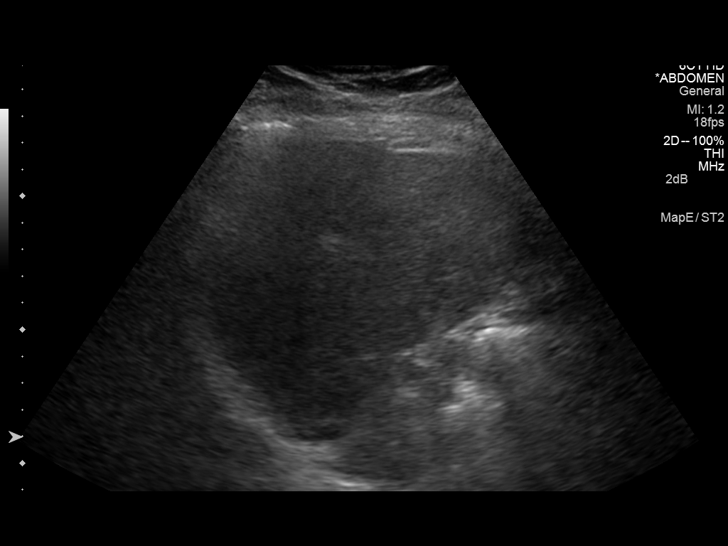
[im 42/46]
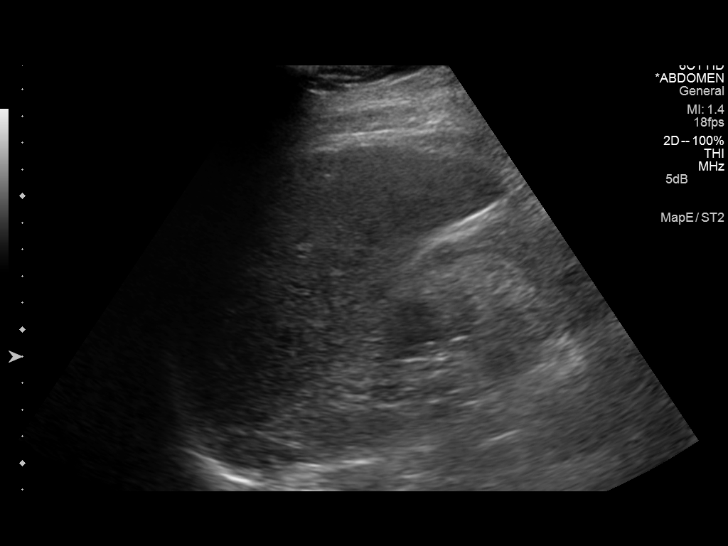
[im 46/46]
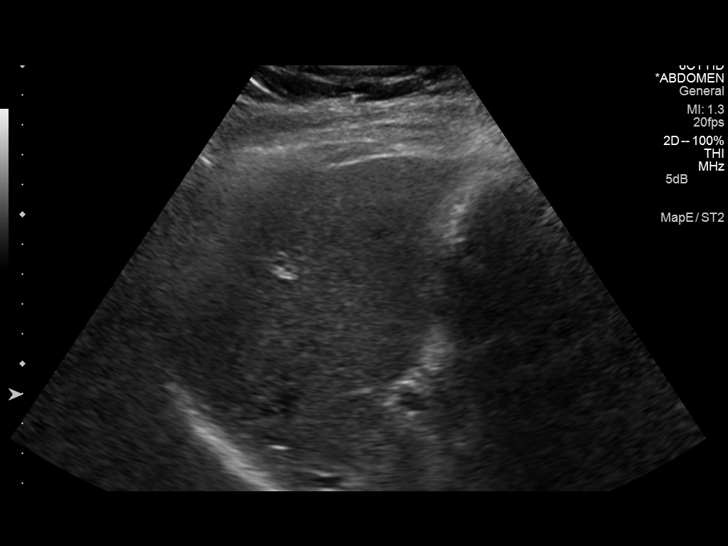

[14 of 25 positions shown; findings below may reference images not displayed]

FINDINGS: Gallbladder:

Again noted tumefactive sludge within gallbladder. There is
progression of gallbladder wall thickening up to 9.8 mm. Findings
are suspicious for acute cholecystitis and clinical correlation is
necessary. There is positive sonographic Murphy's sign.

Common bile duct:

Diameter: 5.7 mm in diameter within normal limits.

Liver:

No focal lesion identified. Within normal limits in parenchymal
echogenicity.
IMPRESSION: Again noted tumefactive sludge within gallbladder. There is
progression of gallbladder wall thickening up to 9.8 mm. Findings
are suspicious for acute cholecystitis and clinical correlation is
necessary. There is positive sonographic Murphy's sign.

## 2015-06-29 DIAGNOSIS — J449 Chronic obstructive pulmonary disease, unspecified: Secondary | ICD-10-CM | POA: Diagnosis not present

## 2015-07-09 ENCOUNTER — Other Ambulatory Visit: Payer: Self-pay | Admitting: Family Medicine

## 2015-07-09 ENCOUNTER — Ambulatory Visit (INDEPENDENT_AMBULATORY_CARE_PROVIDER_SITE_OTHER): Payer: Medicare Other | Admitting: Pharmacist

## 2015-07-09 VITALS — Ht 71.0 in | Wt 202.0 lb

## 2015-07-09 DIAGNOSIS — Z23 Encounter for immunization: Secondary | ICD-10-CM

## 2015-07-09 DIAGNOSIS — I482 Chronic atrial fibrillation, unspecified: Secondary | ICD-10-CM

## 2015-07-09 LAB — POCT INR: INR: 2.1

## 2015-07-09 NOTE — Patient Instructions (Addendum)
  Anticoagulation Dose Instructions as of 07/09/2015      [name] [name] Tue Wed Thu Fri Sat   New Dose 2.5 mg 5 mg 5 mg 5 mg 2.5 mg 5 mg 5 mg    Description        Goal INR 1.8-2.0 Continue warfarin 1/2 tablet on sundays and thursdays.  Take 1 tablet all other days.         INR was 2.1 today

## 2015-07-21 ENCOUNTER — Encounter: Payer: Self-pay | Admitting: Family Medicine

## 2015-07-29 DIAGNOSIS — J449 Chronic obstructive pulmonary disease, unspecified: Secondary | ICD-10-CM | POA: Diagnosis not present

## 2015-07-31 DIAGNOSIS — J449 Chronic obstructive pulmonary disease, unspecified: Secondary | ICD-10-CM | POA: Diagnosis not present

## 2015-08-01 DIAGNOSIS — J449 Chronic obstructive pulmonary disease, unspecified: Secondary | ICD-10-CM | POA: Diagnosis not present

## 2015-08-13 ENCOUNTER — Encounter: Payer: Self-pay | Admitting: Pharmacist

## 2015-08-13 ENCOUNTER — Ambulatory Visit (INDEPENDENT_AMBULATORY_CARE_PROVIDER_SITE_OTHER): Payer: Medicare Other | Admitting: Pharmacist

## 2015-08-13 DIAGNOSIS — I482 Chronic atrial fibrillation, unspecified: Secondary | ICD-10-CM

## 2015-08-13 LAB — POCT INR: INR: 1.9

## 2015-08-13 NOTE — Patient Instructions (Signed)
Anticoagulation Dose Instructions as of 08/13/2015      Dorene Grebe Tue Wed Thu Fri Sat   New Dose 2.5 mg 5 mg 5 mg 5 mg 2.5 mg 5 mg 5 mg    Description        Goal INR 1.8-2.0 Continue warfarin 1/2 tablet on sundays and thursdays.  Take 1 tablet all other days.

## 2015-08-13 NOTE — Progress Notes (Signed)
Checked patient's BP which was elevated today.  He has taken additional BP meds in past which has resulted in DBP being too long and caused dizziness.   I will contact his cardiologist regarding BP.  In meantime patient will check BP and home.  RTC 09/24/2015

## 2015-08-19 ENCOUNTER — Other Ambulatory Visit: Payer: Self-pay | Admitting: Family Medicine

## 2015-08-26 ENCOUNTER — Other Ambulatory Visit: Payer: Self-pay | Admitting: Family Medicine

## 2015-08-29 DIAGNOSIS — J449 Chronic obstructive pulmonary disease, unspecified: Secondary | ICD-10-CM | POA: Diagnosis not present

## 2015-09-11 ENCOUNTER — Telehealth: Payer: Self-pay | Admitting: Family Medicine

## 2015-09-24 ENCOUNTER — Encounter: Payer: Self-pay | Admitting: Pharmacist

## 2015-09-24 ENCOUNTER — Ambulatory Visit (INDEPENDENT_AMBULATORY_CARE_PROVIDER_SITE_OTHER): Payer: Medicare Other | Admitting: Pharmacist

## 2015-09-24 VITALS — BP 152/62 | HR 66 | Ht 71.0 in | Wt 202.0 lb

## 2015-09-24 DIAGNOSIS — E785 Hyperlipidemia, unspecified: Secondary | ICD-10-CM

## 2015-09-24 DIAGNOSIS — D649 Anemia, unspecified: Secondary | ICD-10-CM

## 2015-09-24 DIAGNOSIS — I482 Chronic atrial fibrillation, unspecified: Secondary | ICD-10-CM

## 2015-09-24 LAB — POCT INR: INR: 2.6

## 2015-09-24 NOTE — Progress Notes (Signed)
Subjective:     Indication: atrial fibrillation Bleeding signs/symptoms: None Thromboembolic signs/symptoms: None  Missed Coumadin doses: None Medication changes: no Dietary changes: no Bacterial/viral infection: no Other concerns: yes - due to recheck BP and labs  The following portions of the patient's history were reviewed and updated as appropriate: allergies, current medications, past family history, past medical history and problem list.  Current Outpatient Prescriptions on File Prior to Visit  Medication Sig Dispense Refill  . acetaminophen (TYLENOL) 500 MG tablet Take 1,000 mg by mouth every 6 (six) hours as needed. For pain.     Marland Kitchen aspirin EC 81 MG tablet Take 1 tablet (81 mg total) by mouth every Monday, Wednesday, and Friday.    . budesonide (PULMICORT) 0.25 MG/2ML nebulizer solution Take 0.25 mg by nebulization 2 (two) times daily.     . Cholecalciferol (VITAMIN D3) 5000 UNITS CAPS Take 5,000 Units by mouth daily.     Marland Kitchen docusate sodium (COLACE) 100 MG capsule Take 100 mg by mouth 2 (two) times daily as needed for mild constipation.    Marland Kitchen esomeprazole (NEXIUM) 40 MG capsule Take twice daily on an empty stomach 180 capsule 1  . ferrous sulfate 325 (65 FE) MG tablet Take 325 mg by mouth 2 (two) times daily with a meal.     . furosemide (LASIX) 40 MG tablet Take 1 tablet by mouth  daily 90 tablet 0  . HYDROcodone-acetaminophen (NORCO) 5-325 MG per tablet Take 1 tablet by mouth every 6 (six) hours as needed for moderate pain. 30 tablet 0  . ipratropium-albuterol (DUONEB) 0.5-2.5 (3) MG/3ML SOLN Take 3 mLs by nebulization every 6 (six) hours as needed. For shortness of breath.     . isosorbide-hydrALAZINE (BIDIL) 20-37.5 MG per tablet Take 1 tablet by mouth 3 (three) times daily. 270 tablet 1  . levothyroxine (SYNTHROID, LEVOTHROID) 112 MCG tablet TAKE 1 TABLET (112 MCG TOTAL) BY MOUTH DAILY BEFORE BREAKFAST. 90 tablet 3  . lovastatin (MEVACOR) 40 MG tablet Take 1 tablet by mouth at   bedtime 90 tablet 0  . Multiple Vitamins-Minerals (MULTIVITAMINS THER. W/MINERALS) TABS Take 1 tablet by mouth daily.      . nitroGLYCERIN (NITROSTAT) 0.4 MG SL tablet Place 0.4 mg under the tongue every 5 (five) minutes as needed for chest pain.    . potassium chloride (K-DUR) 10 MEQ tablet Take 1 tablet (10 mEq total) by mouth daily. 90 tablet 2  . PROAIR HFA 108 (90 BASE) MCG/ACT inhaler Use 2 puffs every 6 hours  as needed for shortness of  breath 17 g 1  . warfarin (COUMADIN) 5 MG tablet Take 1 tablet by mouth  every day or as directed by anticoagulation clinic 90 tablet 0   No current facility-administered medications on file prior to visit.     Objective:    INR Today: 2.6 Current dose: warfarin '5mg'$  - take 1/2 tablet sundays and thursdays and 1 tablet all other days.      Filed Vitals:   09/24/15 0820  BP: 152/62  Pulse: 66   Body mass index is 28.19 kg/(m^2).   Assessment:    Supratherapeutic INR for goal of 1.8 to 2.0   HTN Hyperlipidemia  Plan:    1. New dose: no warfarin today, then resume usual dose of 1/2 tablet sundays and thursdays and 1 tablet all other days.   2. Next INR: 1 month  - appt made to follow up with PCP 3.  Add amlodipine 2.'5mg'$  daily for BP.  Monitor of edema.  4.   Orders Placed This Encounter  Procedures  . CMP14+EGFR  . Lipid panel  . CBC with Differential/Platelet    Cherre Robins, PharmD, CPP

## 2015-09-24 NOTE — Patient Instructions (Signed)
Anticoagulation Dose Instructions as of 09/24/2015      Dorene Grebe Tue Wed Thu Fri Sat   New Dose 2.5 mg 5 mg 5 mg 5 mg 2.5 mg 5 mg 5 mg    Description        Goal INR 1.8-2.0 Continue warfarin 1/2 tablet on sundays and thursdays.  Take 1 tablet all other days.         INR was 2.6 today

## 2015-09-25 ENCOUNTER — Telehealth: Payer: Self-pay | Admitting: Pharmacist

## 2015-09-25 LAB — CMP14+EGFR
ALBUMIN: 4.1 g/dL (ref 3.5–4.8)
ALT: 13 IU/L (ref 0–44)
AST: 18 IU/L (ref 0–40)
Albumin/Globulin Ratio: 1.7 (ref 1.1–2.5)
Alkaline Phosphatase: 56 IU/L (ref 39–117)
BUN/Creatinine Ratio: 19 (ref 10–22)
BUN: 24 mg/dL (ref 8–27)
Bilirubin Total: 0.3 mg/dL (ref 0.0–1.2)
CALCIUM: 9.2 mg/dL (ref 8.6–10.2)
CHLORIDE: 102 mmol/L (ref 96–106)
CO2: 27 mmol/L (ref 18–29)
Creatinine, Ser: 1.28 mg/dL — ABNORMAL HIGH (ref 0.76–1.27)
GFR calc Af Amer: 62 mL/min/{1.73_m2} (ref >59)
GFR calc non Af Amer: 54 mL/min/{1.73_m2} — ABNORMAL LOW (ref >59)
GLUCOSE: 96 mg/dL (ref 65–99)
Globulin, Total: 2.4 g/dL (ref 1.5–4.5)
POTASSIUM: 4.7 mmol/L (ref 3.5–5.2)
Sodium: 144 mmol/L (ref 134–144)
TOTAL PROTEIN: 6.5 g/dL (ref 6.0–8.5)

## 2015-09-25 LAB — CBC WITH DIFFERENTIAL/PLATELET
Basophils Absolute: 0.1 10*3/uL (ref 0.0–0.2)
Basos: 1 %
EOS (ABSOLUTE): 0.2 10*3/uL (ref 0.0–0.4)
EOS: 2 %
HEMATOCRIT: 36.2 % — AB (ref 37.5–51.0)
HEMOGLOBIN: 12 g/dL — AB (ref 12.6–17.7)
Immature Grans (Abs): 0 10*3/uL (ref 0.0–0.1)
Immature Granulocytes: 0 %
LYMPHS ABS: 1.6 10*3/uL (ref 0.7–3.1)
Lymphs: 15 %
MCH: 31.9 pg (ref 26.6–33.0)
MCHC: 33.1 g/dL (ref 31.5–35.7)
MCV: 96 fL (ref 79–97)
MONOCYTES: 9 %
Monocytes Absolute: 0.9 10*3/uL (ref 0.1–0.9)
NEUTROS ABS: 8 10*3/uL — AB (ref 1.4–7.0)
Neutrophils: 73 %
Platelets: 324 10*3/uL (ref 150–379)
RBC: 3.76 x10E6/uL — AB (ref 4.14–5.80)
RDW: 13.8 % (ref 12.3–15.4)
WBC: 10.9 10*3/uL — AB (ref 3.4–10.8)

## 2015-09-25 LAB — LIPID PANEL
Chol/HDL Ratio: 3 ratio units (ref 0.0–5.0)
Cholesterol, Total: 134 mg/dL (ref 100–199)
HDL: 44 mg/dL (ref >39)
LDL Calculated: 70 mg/dL (ref 0–99)
TRIGLYCERIDES: 101 mg/dL (ref 0–149)
VLDL Cholesterol Cal: 20 mg/dL (ref 5–40)

## 2015-09-25 MED ORDER — AMLODIPINE BESYLATE 2.5 MG PO TABS: 2.5000 mg | ORAL_TABLET | Freq: Every day | ORAL | Status: DC

## 2015-09-25 NOTE — Telephone Encounter (Signed)
Serum creatinine has increase slightly. Lipids / LFTs and electrolytes are WNL.  WBC and HGB are elevated but stable.  Continue current medications. Has follow up with PCP January 2017 Patient aware of results

## 2015-09-25 NOTE — Telephone Encounter (Signed)
Initial rx was sent to mail order.  Reordered and sent to [facility].

## 2015-09-26 ENCOUNTER — Telehealth: Payer: Self-pay | Admitting: Pharmacist

## 2015-09-26 NOTE — Telephone Encounter (Signed)
Patient wanted left me know he got new BP medication and tolerated first dose

## 2015-09-28 DIAGNOSIS — J449 Chronic obstructive pulmonary disease, unspecified: Secondary | ICD-10-CM | POA: Diagnosis not present

## 2015-10-07 ENCOUNTER — Telehealth: Payer: Self-pay | Admitting: Pharmacist

## 2015-10-07 DIAGNOSIS — R0602 Shortness of breath: Secondary | ICD-10-CM

## 2015-10-07 MED ORDER — AMLODIPINE BESYLATE 5 MG PO TABS: 5.0000 mg | ORAL_TABLET | Freq: Every day | ORAL | Status: DC

## 2015-10-07 MED ORDER — ISOSORB DINITRATE-HYDRALAZINE 20-37.5 MG PO TABS: 1.0000 | ORAL_TABLET | Freq: Three times a day (TID) | ORAL | Status: DC

## 2015-10-07 NOTE — Telephone Encounter (Signed)
Home BP readings still elevated SBP - has been 150's and 160's  Instructed patient to increase to amlodipine 5mg  qd - He will use up 2.5mg  and take 2 tablets.   He also states that he has run out of Bidil.  His mail order pharmacy is sending but it will take 3 to 4 days.  He needs Rx sent to [facility] for a few days of Bidil until he receives mail order Rx.  Rx for #21 bidil 1 tablet tid sent to [facility].

## 2015-10-20 ENCOUNTER — Telehealth: Payer: Self-pay | Admitting: Interventional Cardiology

## 2015-10-20 NOTE — Telephone Encounter (Signed)
Returned pt call. Pt sts that he has been having increased exertional sob and and orthopnea for 1-2 wks. Pt using 2-3 pillow at night. He is unable to walk from his couch to his front door without feeling wiped out. Pt denies cheat pain, palpitaions, nausea, vomiting, swelling. Pt unable to provide his weight today, but sts that his weights are stable. Pt sts that he had chronic sob, but sob has worsened. Pt unable to VS pt sts that his systolic bp has been running [id number], diastolic has been ok.  Pt sts that he tool an add 1/2 lasix  2-3 hours ago and has increased urine out put.   Adv pt that Dr.Smith is out of the office, I have discussed his symptoms with Brittainy Simmons,PA. She adv that pt should come to the office for eval. Pt agreeable. appt scheduled with Brittainy  For tomorrow 1/17 @ 2:30pm. Pt verbalized understanding.

## 2015-10-20 NOTE — Telephone Encounter (Signed)
Pt c/o Shortness Of Breath: STAT if SOB developed within the last 24 hours or pt is noticeably SOB on the phone  1. Are you currently SOB (can you hear that pt is SOB on the phone)? NO 2. How long have you been experiencing SOB? WEEK 3. Are you SOB when sitting or when up moving around?NIGHTS AND WHEN HE WALKS 4. Are you currently experiencing any other symptoms? NO

## 2015-10-21 ENCOUNTER — Encounter: Payer: Self-pay | Admitting: Cardiology

## 2015-10-21 ENCOUNTER — Ambulatory Visit (INDEPENDENT_AMBULATORY_CARE_PROVIDER_SITE_OTHER): Payer: Medicare Other | Admitting: Cardiology

## 2015-10-21 VITALS — BP 170/60 | HR 68 | Ht 70.5 in | Wt 196.4 lb

## 2015-10-21 DIAGNOSIS — I5022 Chronic systolic (congestive) heart failure: Secondary | ICD-10-CM | POA: Diagnosis not present

## 2015-10-21 DIAGNOSIS — I482 Chronic atrial fibrillation, unspecified: Secondary | ICD-10-CM

## 2015-10-21 LAB — CBC WITH DIFFERENTIAL/PLATELET
Basophils Absolute: 0 10*3/uL (ref 0.0–0.1)
Basophils Relative: 0 % (ref 0–1)
EOS ABS: 0.2 10*3/uL (ref 0.0–0.7)
EOS PCT: 2 % (ref 0–5)
HEMATOCRIT: 36.8 % — AB (ref 39.0–52.0)
Hemoglobin: 12.5 g/dL — ABNORMAL LOW (ref 13.0–17.0)
LYMPHS ABS: 1.2 10*3/uL (ref 0.7–4.0)
Lymphocytes Relative: 11 % — ABNORMAL LOW (ref 12–46)
MCH: 32.2 pg (ref 26.0–34.0)
MCHC: 34 g/dL (ref 30.0–36.0)
MCV: 94.8 fL (ref 78.0–100.0)
MONOS PCT: 14 % — AB (ref 3–12)
MPV: 10.6 fL (ref 8.6–12.4)
Monocytes Absolute: 1.6 10*3/uL — ABNORMAL HIGH (ref 0.1–1.0)
NEUTROS PCT: 73 % (ref 43–77)
Neutro Abs: 8.1 10*3/uL — ABNORMAL HIGH (ref 1.7–7.7)
PLATELETS: 367 10*3/uL (ref 150–400)
RBC: 3.88 MIL/uL — ABNORMAL LOW (ref 4.22–5.81)
RDW: 14.3 % (ref 11.5–15.5)
WBC: 11.1 10*3/uL — ABNORMAL HIGH (ref 4.0–10.5)

## 2015-10-21 LAB — BASIC METABOLIC PANEL
BUN: 22 mg/dL (ref 7–25)
CALCIUM: 9.6 mg/dL (ref 8.6–10.3)
CO2: 26 mmol/L (ref 20–31)
CREATININE: 1.36 mg/dL — AB (ref 0.70–1.18)
Chloride: 98 mmol/L (ref 98–110)
Glucose, Bld: 81 mg/dL (ref 65–99)
Potassium: 4.1 mmol/L (ref 3.5–5.3)
Sodium: 138 mmol/L (ref 135–146)

## 2015-10-21 MED ORDER — AMLODIPINE BESYLATE 10 MG PO TABS: 10.0000 mg | ORAL_TABLET | Freq: Every day | ORAL | Status: DC

## 2015-10-21 NOTE — Progress Notes (Signed)
10/21/2015 Joachim R Schillaci   10-21-1940  [id number]  Primary Physician Claretta Fraise, MD Primary Cardiologist: Dr. Tamala Julian  Reason for Visit/CC: DOE  HPI:  75 y/o male followed by Dr. 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 He underwent PCI utilizing a BMS to the [facility] in 07/2014. 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   He presents to clinic today with a complaint of progressive DOE. He denies resting dyspnea. No chest pain, dizziness, syncope/ near syncope or melena. He has notice mild LEE and his BP has been running unusually high lately, despite full medication compliance. There have been no recent medication adjustments and no dietary indiscretion with sodium. He called the office yesterday was was instructed to take an additional lasix tablet. He did this and noted some  Improvement. His BP today in clinic is elevated at 170/60.     Current Outpatient Prescriptions  Medication Sig Dispense Refill  . acetaminophen (TYLENOL) 500 MG tablet Take 1,000 mg by mouth every 6 (six) hours as needed. For pain.     Marland Kitchen amLODipine (NORVASC) 5 MG tablet Take 1 tablet (5 mg total) by mouth daily. 90 tablet 0  . aspirin EC 81 MG tablet Take 1 tablet (81 mg total) by mouth every Monday, Wednesday, and Friday.    . budesonide (PULMICORT) 0.25 MG/2ML nebulizer solution Take 0.25 mg by nebulization 2 (two) times daily.     . Cholecalciferol (VITAMIN D3) 5000 UNITS CAPS Take 5,000 Units by mouth daily.     Marland Kitchen docusate sodium (COLACE) 100 MG capsule Take 100 mg by mouth 2 (two) times daily as needed for mild constipation.    Marland Kitchen esomeprazole (NEXIUM) 40 MG capsule Take twice daily on an empty stomach 180 capsule 1  . ferrous sulfate 325 (65 FE) MG tablet Take 325 mg by mouth 2 (two) times daily with a meal.     . furosemide (LASIX) 40 MG tablet Take 1  tablet by mouth  daily 90 tablet 0  . HYDROcodone-acetaminophen (NORCO) 5-325 MG per tablet Take 1 tablet by mouth every 6 (six) hours as needed for moderate pain. 30 tablet 0  . ipratropium-albuterol (DUONEB) 0.5-2.5 (3) MG/3ML SOLN Take 3 mLs by nebulization every 6 (six) hours as needed. For shortness of breath.     . isosorbide-hydrALAZINE (BIDIL) 20-37.5 MG tablet Take 1 tablet by mouth 3 (three) times daily. 21 tablet 0  . levothyroxine (SYNTHROID, LEVOTHROID) 112 MCG tablet TAKE 1 TABLET (112 MCG TOTAL) BY MOUTH DAILY BEFORE BREAKFAST. 90 tablet 3  . lovastatin (MEVACOR) 40 MG tablet Take 1 tablet by mouth at  bedtime 90 tablet 0  . Multiple Vitamins-Minerals (MULTIVITAMINS THER. W/MINERALS) TABS Take 1 tablet by mouth daily.      . nitroGLYCERIN (NITROSTAT) 0.4 MG SL tablet Place 0.4 mg under the tongue every 5 (five) minutes as needed for chest pain.    . potassium chloride (K-DUR) 10 MEQ tablet Take 1 tablet (10 mEq total) by mouth daily. 90 tablet 2  . PROAIR HFA 108 (90 BASE) MCG/ACT inhaler Use 2 puffs every 6 hours  as needed for shortness of  breath 17 g 1  . warfarin (COUMADIN) 5 MG tablet Take 1 tablet by mouth  every day or as directed by anticoagulation clinic 90 tablet 0  No current facility-administered medications for this visit.    No Known Allergies  Social History   Social History  . Marital Status: Married    Spouse Name: N/A  . Number of Children: N/A  . Years of Education: N/A   Occupational History  . retired    Social History Main Topics  . Smoking status: Former Smoker -- 1.00 packs/day for 42 years    Types: Cigarettes    Quit date: 12/16/1996  . Smokeless tobacco: Never Used  . Alcohol Use: 0.6 oz/week    1 Cans of beer per week     Comment: 07/09/2014 "averages < 1 beer/wk", 11/26/14: denies ETOH  . Drug Use: No  . Sexual Activity: No   Other Topics Concern  . Not on file   Social History Narrative     Review of Systems: General: negative  for chills, fever, night sweats or weight changes.  Cardiovascular: negative for chest pain, dyspnea on exertion, edema, orthopnea, palpitations, paroxysmal nocturnal dyspnea or shortness of breath Dermatological: negative for rash Respiratory: negative for cough or wheezing Urologic: negative for hematuria Abdominal: negative for nausea, vomiting, diarrhea, bright red blood per rectum, melena, or hematemesis Neurologic: negative for visual changes, syncope, or dizziness All other systems reviewed and are otherwise negative except as noted above.    Blood pressure 170/60, pulse 68, height 5' 10.5" (1.791 m), weight 196 lb 6.4 oz (89.086 kg).  General appearance: alert, cooperative and no distress Neck: no carotid bruit and no JVD Lungs: clear to auscultation bilaterally Heart: irregularly irregular rhythm and regular rate Extremities: no LEE Pulses: 2+ and symmetric Skin: warm and dry Neurologic: Grossly normal  EKG atrial fibrillation. 68 bpm.   ASSESSMENT AND PLAN:   1. DOE: no chest pain. BP has been elevated. His EKG shows atrial fibrillation with a CVR.  ? Acute on chronic diastolic CHF. We will check a BNP today. We will also order a BMP. If BNP is elevated, we will have him temporarily increase his lasix to BID. Given his h/o GIB and anemia, we will check a CBC.   2.  HTN: unable to tolerate AVN blocking agents given a h/o sinus pauses. He is on isordil, lasix and amlodipine. We will further increase his amlodipine to 10 mg daily. Patient instructed to monitor BP closely at home. F/u in 1 week.  3. CAD: h/o CABG + PCI/BMS to the [facility] in 2015. He denies any chest pain.   4. Atrial Fibrillation: his HR is controlled. He is on Coumadin for a/c. INRs are followed by his PCP.    Lyda Jester PA-C 10/21/2015 2:45 PM

## 2015-10-21 NOTE — Patient Instructions (Signed)
Medication Instructions:  Your physician has recommended you make the following change in your medication:  INCREASE Amlodipine to 10mg  daily   Labwork: Cbc, Bmet, Bnp today  Testing/Procedures: None ordered  Follow-Up: Your physician recommends that you schedule a follow-up appointment on 12/25/15 @ 10:45am with Dr.Smith   Any Other Special Instructions Will Be Listed Below (If Applicable).     If you need a refill on your cardiac medications before your next appointment, please call your pharmacy.

## 2015-10-22 ENCOUNTER — Telehealth: Payer: Self-pay

## 2015-10-22 LAB — BRAIN NATRIURETIC PEPTIDE: Brain Natriuretic Peptide: 153.5 pg/mL — ABNORMAL HIGH (ref 0.0–100.0)

## 2015-10-23 NOTE — Telephone Encounter (Signed)
Lmom. Called to see how pt was doing.Pt labs seem stable. BNP slightly elevated. We will call with results and further recommendation, once Brittainy Simmons,PA. returns and has the opportunity to review pt lab results.

## 2015-10-28 ENCOUNTER — Other Ambulatory Visit: Payer: Self-pay | Admitting: *Deleted

## 2015-10-28 DIAGNOSIS — I5022 Chronic systolic (congestive) heart failure: Secondary | ICD-10-CM

## 2015-10-28 DIAGNOSIS — I482 Chronic atrial fibrillation, unspecified: Secondary | ICD-10-CM

## 2015-10-28 MED ORDER — AMLODIPINE BESYLATE 10 MG PO TABS: 10.0000 mg | ORAL_TABLET | Freq: Every day | ORAL | Status: DC

## 2015-10-28 NOTE — Telephone Encounter (Signed)
Per voicemail, [facility] needs a collaborating physician before they can refill the amlodipine.

## 2015-10-29 DIAGNOSIS — J449 Chronic obstructive pulmonary disease, unspecified: Secondary | ICD-10-CM | POA: Diagnosis not present

## 2015-10-30 ENCOUNTER — Encounter: Payer: Self-pay | Admitting: Family Medicine

## 2015-10-30 ENCOUNTER — Ambulatory Visit (INDEPENDENT_AMBULATORY_CARE_PROVIDER_SITE_OTHER): Payer: Medicare Other | Admitting: Family Medicine

## 2015-10-30 DIAGNOSIS — I482 Chronic atrial fibrillation, unspecified: Secondary | ICD-10-CM

## 2015-10-30 DIAGNOSIS — R7989 Other specified abnormal findings of blood chemistry: Secondary | ICD-10-CM

## 2015-10-30 DIAGNOSIS — R945 Abnormal results of liver function studies: Secondary | ICD-10-CM

## 2015-10-30 LAB — POCT INR: INR: 2.8

## 2015-10-30 NOTE — Progress Notes (Signed)
Subjective:  Patient ID: [name] [name], male    DOB: 01-08-1941  Age: 75 y.o. MRN: [id number]  CC: Atrial Fibrillation and Abdominal Pain   HPI [name] [name] presents for  Patient in for follow-up of atrial fibrillation. Patient denies any recent bouts of chest pain or palpitations. Additionally, patient is taking anticoagulants. Patient denies any recent excessive bleeding episodes including epistaxis, bleeding from the gums, genitalia, rectal bleeding or hematuria. Additionally there has been no excessive bruising.  Patient continues to have elevated liver functions and hepatomegaly. Mild diffuse abdominal pain.   History [name] [name] has a past medical history of Unspecified essential hypertension; Anemia; Asthma; Hepatomegaly; Esophageal reflux; Other and unspecified coagulation defects; Hiatal hernia; Personal history of colonic polyps (05/29/2010); CAD (coronary artery disease); Hypothyroidism; Gout; Hyperlipidemia; Gastric antral vascular ectasia (2013); Atrial fibrillation ([facility]); History of blood transfusion (~ 2012); Arthritis; On home oxygen therapy; COPD (chronic obstructive pulmonary disease) ([place]); and Gallstones.   He has past surgical history that includes Umbilical hernia repair; Coronary angioplasty with stent (~ 2008; 07/08/2014); Coronary artery bypass graft (1998); Hernia repair; Umbilical hernia repair (1970's); left and right heart catheterization with coronary/graft angiogram (N/A, 07/09/2014); Colonoscopy (2013); Esophagogastroduodenoscopy (2013); and Givens capsule study (2013).   His family history includes Cancer in his father; Early death in his brother; Heart attack in his brother; Heart disease in his father; Kidney disease in his mother; Leukemia in his mother; Stomach cancer in his sister and sister. There is no history of Colon cancer.He reports that he quit smoking about 18 years ago. His smoking use included Cigarettes. He has a 42 pack-year smoking history. He has  never used smokeless tobacco. He reports that he drinks about 0.6 oz of alcohol per week. He reports that he does not use illicit drugs.    ROS Review of Systems  Constitutional: Negative for fever, chills, diaphoresis and unexpected weight change.  HENT: Negative for rhinorrhea and trouble swallowing.   Respiratory: Negative for cough, chest tightness and shortness of breath.   Cardiovascular: Negative for chest pain.  Gastrointestinal: Positive for abdominal pain. Negative for nausea, vomiting, diarrhea, constipation, blood in stool, abdominal distention and rectal pain.  Genitourinary: Negative for dysuria, hematuria and flank pain.  Musculoskeletal: Negative for joint swelling and arthralgias.  Skin: Negative for rash.  Neurological: Negative for syncope and headaches.    Objective:  BP 154/76 mmHg  Pulse 58  Temp(Src) 97 F (36.1 C) (Oral)  Ht 5' 10.5" (1.791 m)  Wt 200 lb 12.8 oz (91.082 kg)  BMI 28.39 kg/m2  BP Readings from Last 3 Encounters:  10/30/15 154/76  10/21/15 170/60  09/24/15 152/62    Wt Readings from Last 3 Encounters:  10/30/15 200 lb 12.8 oz (91.082 kg)  10/21/15 196 lb 6.4 oz (89.086 kg)  09/24/15 202 lb (91.627 kg)     Physical Exam  Constitutional: He is oriented to person, place, and time. He appears well-developed and well-nourished. No distress.  HENT:  Head: Normocephalic and atraumatic.  Right Ear: External ear normal.  Left Ear: External ear normal.  Nose: Nose normal.  Mouth/Throat: Oropharynx is clear and moist.  Eyes: Conjunctivae and EOM are normal. Pupils are equal, round, and reactive to light.  Neck: Normal range of motion. Neck supple. No thyromegaly present.  Cardiovascular: Normal rate, regular rhythm and normal heart sounds.   No murmur heard. Pulmonary/Chest: Effort normal and breath sounds normal. No respiratory distress. He has no wheezes. He has no rales.  Abdominal: Soft.  Bowel sounds are normal. He exhibits  distension. There is tenderness (mild, diffuse).  Lymphadenopathy:    He has no cervical adenopathy.  Neurological: He is alert and oriented to person, place, and time. He has normal reflexes.  Skin: Skin is warm and dry.  Psychiatric: He has a normal mood and affect. His behavior is normal. Judgment and thought content normal.     Lab Results  Component Value Date   WBC 11.1* 10/21/2015   HGB 12.5* 10/21/2015   HCT 36.8* 10/21/2015   PLT 367 10/21/2015   GLUCOSE 81 10/21/2015   CHOL 134 09/24/2015   TRIG 101 09/24/2015   HDL 44 09/24/2015   LDLCALC 70 09/24/2015   ALT 14 10/30/2015   AST 20 10/30/2015   NA 138 10/21/2015   K 4.1 10/21/2015   CL 98 10/21/2015   CREATININE 1.36* 10/21/2015   BUN 22 10/21/2015   CO2 26 10/21/2015   TSH 3.410 04/23/2015   PSA 0.78 11/26/2011   INR 2.8 10/30/2015    Ct Abdomen Pelvis Wo Contrast  01/06/2015  CLINICAL DATA:  Cholecystitis. EXAM: CT ABDOMEN AND PELVIS WITHOUT CONTRAST TECHNIQUE: Multidetector CT imaging of the abdomen and pelvis was performed following the standard protocol without IV contrast. COMPARISON:  CT scan of November 26, 2014. FINDINGS: Mild multilevel degenerative disc disease is noted in the lumbar spine. No significant abnormality is noted in visualized lung bases. The spleen and pancreas appear normal. Adrenal glands and kidneys appear normal. Atherosclerotic calcifications of abdominal aorta and iliac arteries are noted without aneurysm formation. Mild prostatic hypertrophy is noted. Urinary bladder appears normal. There is no evidence of bowel obstruction. No renal or ureteral calculi are noted. No hydronephrosis or renal obstruction is noted. Gallbladder wall thickening is noted with surrounding fluid suggesting cholecystitis. Fluid is also seen along the lateral margin of the right hepatic lobe. Cholecystostomy drainage catheter is not visualized and presumably has been removed. IMPRESSION: Gallbladder wall thickening  with surrounding fluid is noted suggesting cholecystitis. Fluid is also noted along the lateral margin of the right hepatic lobe. Cholecystostomy drainage catheter is not visualized and presumably has been removed. Electronically Signed   By: Marijo Conception, M.D.   On: 01/06/2015 20:35   Ir Sinus/fist Tube Chk-non Gi  01/06/2015  CLINICAL DATA:  75 year old with history of cholecystitis and recently placed percutaneous cholecystostomy tube. The catheter is no longer draining and unable to flush the catheter. Patient presents for catheter evaluation. EXAM: INJECTION AND ATTEMPTED EXCHANGE OF PERCUTANEOUS CHOLECYSTOSTOMY TUBE. Physician: Stephan Minister. Henn, MD FLUOROSCOPY TIME:  9 minutes and 6 seconds, 259.4 mGy MEDICATIONS AND MEDICAL HISTORY: None ANESTHESIA/SEDATION: Moderate sedation time: None CONTRAST:  10 mL Omnipaque-300 PROCEDURE: The procedure was explained to the patient. The risks and benefits of the procedure were discussed and the patient's questions were addressed. Informed consent was obtained from the patient. The tube would not flush even with a 3 mL syringe. Unable to inject contrast through the tube. The existing catheter and surrounding skin were prepped and draped in sterile fashion. Maximal barrier sterile technique was utilized including caps, mask, sterile gowns, sterile gloves, sterile drape, hand hygiene and skin antiseptic. The skin around the catheter was anesthetized with 1% lidocaine. The suture was removed. Multiple different wires were advanced through the catheter. There was an abrupt obstruction in the midportion of the catheter. Despite using multiple wires and different techniques, wire could never be successfully advanced through the catheter. Multiple attempts were made to place a  second catheter along the existing catheter. These attempts to place a "parallel catheter" were unsuccessful. As a result, the catheter was slowly pulled out of the patient until the point of obstruction  was exposed. The catheter was cut at the point of obstruction but, unfortunately, the remaining catheter was also completely obstructed. Therefore, a wire could be advanced through this catheter. At this point, there was no option but to completely remove the catheter. A 5 French Kumpe catheter was advanced through the track and multiple attempts were made to cannulate the old tract. These attempts were unsuccessful. In addition, the patient was very uncomfortable with any manipulation. Bandage was placed at the site. FINDINGS: The percutaneous cholecystostomy tube appeared to appropriately positioned based on fluoroscopy. The mid and distal aspect of the catheter were completely obstructed. After the catheter was removed, rock-hard material was identified within the catheter. Unable to replace a catheter through the old tract. Dark bile was draining from the site. Estimated blood loss: None COMPLICATIONS: None IMPRESSION: Complete obstruction of the percutaneous cholecystostomy tube. The cholecystostomy tube was removed but a new tube could not be placed through the old tract. Following the procedure, the patient was complaining of increased abdominal pain. Patient was at risk for bile leakage and peritonitis because this was a non-mature cholecystostomy tube tract. These findings were discussed with Dr. Lucia Gaskins of General Surgery. Patient was transferred to the emergency department and later admitted to the hospital. Electronically Signed   By: Markus Daft M.D.   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 Patient underwent ultrasound fluoroscopic guided cholecystostomy tube placement on 12/28/2014 however returned on 01/06/2015 for evaluation due to inability to flush the cholecystostomy tube. The cholecystostomy tube was unable to be exchanged secondary to complete tube exclusion and access to the  gallbladder fossa was ultimately loss during the attempted exchange. Replaced made for placement of a new percutaneous cholecystostomy tube. EXAM: ATTEMPTED ULTRASOUND AND CT-GUIDED CHOLECYSTOSTOMY TUBE PLACEMENT COMPARISON:  Ultrasound fluoroscopic guided cholecystostomy tube placement - 12/28/2014; attempted fluoroscopic guided cholecystostomy tube exchange - 01/06/2015; CT abdomen and pelvis - 01/06/2015 MEDICATIONS: The patient is currently admitted to the hospital and receiving intravenous antibiotics. The antibiotics were administered within an appropriate time frame prior to the initiation of the procedure. ANESTHESIA/SEDATION: Fentanyl 250 mcg IV; Versed 5 mg IV Total Moderate Sedation time 50 minutes CONTRAST:  None COMPLICATIONS: None immediate PROCEDURE: Informed written consent was obtained from the patient after a discussion of the risks, benefits and alternatives to treatment. The patient was placed supine on the CT gantry and a pre procedural CT was performed re- demonstrated underdistention of the gallbladder with a very minimal amount of pericholecystic fluid. The procedure was planned. A timeout was performed prior to the initiation of the procedure. The right lateral upper abdomen was prepped and draped in the usual sterile fashion. The overlying soft tissues were anesthetized with 1% lidocaine with epinephrine. Initially, trajectory was planned with the use of 22 gauge spinal needle under direct ultrasound guidance. Appropriate positioning was confirmed and 18 gauge trocar needle was advanced into the gallbladder lumen also under direct ultrasound guidance. Note, there was difficulty advancing the trocar needle through the decompressed patulous wall of the gallbladder however appropriate positioning was ultimately confirmed with a combination of ultrasound and CT guidance. Next, there is great difficulty coiling the Amplatz wire within the gallbladder lumen secondary to friability of the  gallbladder wall. Ultimately, the Amplatz wire was coiled within the neck of the decompressed gallbladder as confirmed with CT imaging (series 10), however despite serial dilatation, a 10 French percutaneous drain would not coil within the gallbladder fossa and as such was subsequently removed. A dressing was placed. The patient tolerated the attempted procedure well without immediate postprocedural complication. IMPRESSION: Unsuccessful attempted ultrasound and CT guided placement of a 10 French cholecystostomy tube secondary to gallbladder underdistention and marked friability of the gallbladder wall. Above discussed with to general surgery PA, [place] at 14:37. Electronically Signed   By: Sandi Mariscal M.D.   On: 01/07/2015 14:47    Assessment & Plan:   Xzavior was seen today for atrial fibrillation and abdominal pain.  Diagnoses and all orders for this visit:  Atrial fibrillation, chronic (HCC) -     POCT INR  Elevated LFTs -     Hepatic function panel  Other orders -     HYDROcodone-acetaminophen (NORCO) 5-325 MG tablet; Take 1 tablet by mouth every 6 (six) hours as needed for moderate pain.      I have changed Mr. Nau HYDROcodone-acetaminophen. I am also having him maintain his ipratropium-albuterol, budesonide, multivitamins ther. w/minerals, Vitamin D3, acetaminophen, nitroGLYCERIN, ferrous sulfate, aspirin EC, docusate sodium, levothyroxine, esomeprazole, potassium chloride, lovastatin, warfarin, PROAIR HFA, furosemide, isosorbide-hydrALAZINE, and amLODipine.  Meds ordered this encounter  Medications  . HYDROcodone-acetaminophen (NORCO) 5-325 MG tablet    Sig: Take 1 tablet by mouth every 6 (six) hours as needed for moderate pain.    Dispense:  30 tablet    Refill:  0     Follow-up: Return in about 3 months (around 01/28/2016).  Claretta Fraise, M.D.

## 2015-10-31 LAB — HEPATIC FUNCTION PANEL
ALT: 14 IU/L (ref 0–44)
AST: 20 IU/L (ref 0–40)
Albumin: 4 g/dL (ref 3.5–4.8)
Alkaline Phosphatase: 55 IU/L (ref 39–117)
Bilirubin Total: 0.4 mg/dL (ref 0.0–1.2)
Bilirubin, Direct: 0.18 mg/dL (ref 0.00–0.40)
Total Protein: 6.5 g/dL (ref 6.0–8.5)

## 2015-11-02 MED ORDER — HYDROCODONE-ACETAMINOPHEN 5-325 MG PO TABS: 1.0000 | ORAL_TABLET | Freq: Four times a day (QID) | ORAL | Status: DC | PRN

## 2015-11-03 ENCOUNTER — Telehealth: Payer: Self-pay | Admitting: Family Medicine

## 2015-11-03 ENCOUNTER — Ambulatory Visit: Payer: Medicare Other | Admitting: Pharmacist

## 2015-11-03 ENCOUNTER — Telehealth: Payer: Self-pay | Admitting: *Deleted

## 2015-11-03 DIAGNOSIS — I482 Chronic atrial fibrillation, unspecified: Secondary | ICD-10-CM

## 2015-11-03 DIAGNOSIS — J449 Chronic obstructive pulmonary disease, unspecified: Secondary | ICD-10-CM | POA: Diagnosis not present

## 2015-11-03 NOTE — Telephone Encounter (Signed)
Patient had INR with [facility] last week and it was 2.8 patient would like you to review.

## 2015-11-03 NOTE — Telephone Encounter (Signed)
Patient aware that rx is ready to be picked up.  

## 2015-11-03 NOTE — Telephone Encounter (Signed)
Patient advised to hold warfarin for 1 day then then decreased to 1/2 tablet Su, TU, TH.  1 tablet all other days.  Goal INR is 2.0 to 3.0

## 2015-11-05 ENCOUNTER — Other Ambulatory Visit: Payer: Self-pay | Admitting: Family Medicine

## 2015-11-18 DIAGNOSIS — K811 Chronic cholecystitis: Secondary | ICD-10-CM | POA: Diagnosis not present

## 2015-11-19 ENCOUNTER — Encounter: Payer: Self-pay | Admitting: Gastroenterology

## 2015-11-23 ENCOUNTER — Other Ambulatory Visit: Payer: Self-pay | Admitting: Family Medicine

## 2015-11-29 DIAGNOSIS — J449 Chronic obstructive pulmonary disease, unspecified: Secondary | ICD-10-CM | POA: Diagnosis not present

## 2015-12-02 ENCOUNTER — Ambulatory Visit (INDEPENDENT_AMBULATORY_CARE_PROVIDER_SITE_OTHER): Payer: Medicare Other | Admitting: Internal Medicine

## 2015-12-02 ENCOUNTER — Encounter: Payer: Self-pay | Admitting: Internal Medicine

## 2015-12-02 VITALS — BP 140/42 | HR 64 | Ht 68.5 in | Wt 197.0 lb

## 2015-12-02 DIAGNOSIS — K219 Gastro-esophageal reflux disease without esophagitis: Secondary | ICD-10-CM

## 2015-12-02 DIAGNOSIS — K811 Chronic cholecystitis: Secondary | ICD-10-CM

## 2015-12-02 DIAGNOSIS — R109 Unspecified abdominal pain: Secondary | ICD-10-CM

## 2015-12-02 DIAGNOSIS — K5900 Constipation, unspecified: Secondary | ICD-10-CM

## 2015-12-02 DIAGNOSIS — D509 Iron deficiency anemia, unspecified: Secondary | ICD-10-CM

## 2015-12-02 DIAGNOSIS — Z86010 Personal history of colonic polyps: Secondary | ICD-10-CM | POA: Diagnosis not present

## 2015-12-02 MED ORDER — POLYETHYLENE GLYCOL 3350 17 GM/SCOOP PO POWD: ORAL | Status: DC

## 2015-12-02 NOTE — Patient Instructions (Addendum)
We have sent the following medications to your pharmacy for you to pick up at your convenience: Miralax 17 grams daily  Continue Prilosec 20 mg daily  Continue Zantac every night as needed.  Follow up with Dr Hilarie Fredrickson in 1 year.  Please call our office if Miralax does not work for you.  Please follow a low fat diet.  Fat and Cholesterol Restricted Diet High levels of fat and cholesterol in your blood may lead to various health problems, such as diseases of the heart, blood vessels, gallbladder, liver, and pancreas. Fats are concentrated sources of energy that come in various forms. Certain types of fat, including saturated fat, may be harmful in excess. Cholesterol is a substance needed by your body in small amounts. Your body makes all the cholesterol it needs. Excess cholesterol comes from the food you eat. When you have high levels of cholesterol and saturated fat in your blood, health problems can develop because the excess fat and cholesterol will gather along the walls of your blood vessels, causing them to narrow. Choosing the right foods will help you control your intake of fat and cholesterol. This will help keep the levels of these substances in your blood within normal limits and reduce your risk of disease. WHAT IS MY PLAN? Your health care provider recommends that you:  Get no more than __________ % of the total calories in your daily diet from fat.  Limit your intake of saturated fat to less than ______% of your total calories each day.  Limit the amount of cholesterol in your diet to less than _________mg per day. WHAT TYPES OF FAT SHOULD I CHOOSE?  Choose healthy fats more often. Choose monounsaturated and polyunsaturated fats, such as olive and canola oil, flaxseeds, walnuts, almonds, and seeds.  Eat more omega-3 fats. Good choices include salmon, mackerel, sardines, tuna, flaxseed oil, and ground flaxseeds. Aim to eat fish at least two times a week.  Limit saturated  fats. Saturated fats are primarily found in animal products, such as meats, butter, and cream. Plant sources of saturated fats include palm oil, palm kernel oil, and coconut oil.  Avoid foods with partially hydrogenated oils in them. These contain trans fats. Examples of foods that contain trans fats are stick margarine, some tub margarines, cookies, crackers, and other baked goods. WHAT GENERAL GUIDELINES DO I NEED TO FOLLOW? These guidelines for healthy eating will help you control your intake of fat and cholesterol:  Check food labels carefully to identify foods with trans fats or high amounts of saturated fat.  Fill one half of your plate with vegetables and green salads.  Fill one fourth of your plate with whole grains. Look for the word "whole" as the first word in the ingredient list.  Fill one fourth of your plate with lean protein foods.  Limit fruit to two servings a day. Choose fruit instead of juice.  Eat more foods that contain soluble fiber. Examples of foods that contain this type of fiber are apples, broccoli, carrots, beans, peas, and barley. Aim to get 20-30 g of fiber per day.  Eat more home-cooked food and less restaurant, buffet, and fast food.  Limit or avoid alcohol.  Limit foods high in starch and sugar.  Limit fried foods.  Cook foods using methods other than frying. Baking, boiling, grilling, and broiling are all great options.  Lose weight if you are overweight. Losing just 5-10% of your initial body weight can help your overall health and prevent diseases such  as diabetes and heart disease. WHAT FOODS CAN I EAT? Grains Whole grains, such as whole wheat or whole grain breads, crackers, cereals, and pasta. Unsweetened oatmeal, bulgur, barley, quinoa, or brown rice. Corn or whole wheat flour tortillas. Vegetables Fresh or frozen vegetables (raw, steamed, roasted, or grilled). Green salads. Fruits All fresh, canned (in natural juice), or frozen fruits. Meat  and Other Protein Products Ground beef (85% or leaner), grass-fed beef, or beef trimmed of fat. Skinless chicken or [place]. Ground chicken or [place]. Pork trimmed of fat. All fish and seafood. Eggs. Dried beans, peas, or lentils. Unsalted nuts or seeds. Unsalted canned or dry beans. Dairy Low-fat dairy products, such as skim or 1% milk, 2% or reduced-fat cheeses, low-fat ricotta or cottage cheese, or plain low-fat yogurt. Fats and Oils Tub margarines without trans fats. Light or reduced-fat mayonnaise and salad dressings. Avocado. Olive, canola, sesame, or safflower oils. Natural peanut or almond butter (choose ones without added sugar and oil). The items listed above may not be a complete list of recommended foods or beverages. Contact your dietitian for more options. WHAT FOODS ARE NOT RECOMMENDED? Grains White bread. White pasta. White rice. Cornbread. Bagels, pastries, and croissants. Crackers that contain trans fat. Vegetables White potatoes. Corn. Creamed or fried vegetables. Vegetables in a cheese sauce. Fruits Dried fruits. Canned fruit in light or heavy syrup. Fruit juice. Meat and Other Protein Products Fatty cuts of meat. Ribs, chicken wings, bacon, sausage, bologna, salami, chitterlings, fatback, hot dogs, bratwurst, and packaged luncheon meats. Liver and organ meats. Dairy Whole or 2% milk, cream, half-and-half, and cream cheese. Whole milk cheeses. Whole-fat or sweetened yogurt. Full-fat cheeses. Nondairy creamers and whipped toppings. Processed cheese, cheese spreads, or cheese curds. Sweets and Desserts Corn syrup, sugars, honey, and molasses. Candy. Jam and jelly. Syrup. Sweetened cereals. Cookies, pies, cakes, donuts, muffins, and ice cream. Fats and Oils Butter, stick margarine, lard, shortening, ghee, or bacon fat. Coconut, palm kernel, or palm oils. Beverages Alcohol. Sweetened drinks (such as sodas, lemonade, and fruit drinks or punches). The items listed above may not  be a complete list of foods and beverages to avoid. Contact your dietitian for more information.   This information is not intended to replace advice given to you by your health care provider. Make sure you discuss any questions you have with your health care provider.   Document Released: 09/20/2005 Document Revised: 10/11/2014 Document Reviewed: 12/19/2013 Elsevier Interactive Patient Education [employer].

## 2015-12-02 NOTE — Progress Notes (Signed)
Patient ID: [name] [name], male   DOB: 04-Oct-1941, 75 y.o.   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 This is his first visit with me and he is here today with his wife.  He reports that he is having issues with constipation and using mag citrate 2 days per week. This causes diarrhea but is effective for him. He is having intermittent right middle abdominal pain which radiates across the abdomen. This is worse with eating fatty foods and foods such as red meat. He has been avoiding red meat and fried foods and with this the pain has improved. He occasionally takes Vicodin for this pain when it is severe. He follows with Dr. Hulen Skains who is considering cholecystectomy. Cholecystectomy was deferred in March 2016 due to medical comorbidities. He has follow-up with Dr. Hulen Skains in April and cholecystectomy is still being considered. He uses Prilosec 20 mg each morning and Zantac 150 in the evening as needed. With this heartburn and indigestion has been controlled. He denies nausea and vomiting. Denies dysphagia. He does continue on oral iron and with this his stools are dark. He has not seen blood in his stools. He denies chest pain and has not needed nitroglycerin. He does have issue with dyspnea on exertion and wears oxygen at night. He continues on warfarin for his history of atrial fibrillation and aspirin 3 days per week.  Past Medical History  Diagnosis Date  . Unspecified essential hypertension   . Anemia   . Asthma   . Hepatomegaly   . Esophageal reflux   . Other and unspecified coagulation defects   . Hiatal hernia   . Personal history of colonic polyps 05/29/2010    TUBULAR ADENOMA  . CAD (coronary artery disease)   .  Hypothyroidism   . Gout   . Hyperlipidemia   . Gastric antral vascular ectasia 2013  . Atrial fibrillation ([place])   . History of blood transfusion ~ 2012    "blood count dropped; had to get 3 units"  . Arthritis     "hips; back" (07/09/2014)  . On home oxygen therapy     "2L at night" (07/09/2014)  . COPD (chronic obstructive pulmonary disease) ([place])   . Gallstones     Past Surgical History  Procedure Laterality Date  . Umbilical hernia repair    . Coronary angioplasty with stent placement  ~ 2008; 07/08/2014    "1 + 3"  . 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 Hernia repair    . Umbilical hernia repair  1970's  . Left and right heart catheterization with coronary/graft angiogram N/A 07/09/2014    Procedure: LEFT AND RIGHT HEART CATHETERIZATION WITH Beatrix Fetters;  Surgeon: Sinclair Grooms, MD;  Location: [facility] CATH LAB;  Service: Cardiovascular;  Laterality: N/A;  . Colonoscopy  2013    Dr. Sharlett Iles: no polyps or evidence of active bleeding  . Esophagogastroduodenoscopy  2013    Dr. 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 Givens capsule study  2013    Dr. Sharlett Iles: AVM at 5 minutes beyond first duodenal image. If persistent IDA, bleeding, recommend enteroscopy with ablation     Outpatient Prescriptions Prior to  Visit  Medication Sig Dispense Refill  . acetaminophen (TYLENOL) 500 MG tablet Take 1,000 mg by mouth every 6 (six) hours as needed. For pain.     Marland Kitchen amLODipine (NORVASC) 10 MG tablet Take 1 tablet (10 mg total) by mouth daily. 90 tablet 2  . aspirin EC 81 MG tablet Take 1 tablet (81 mg total) by mouth every Monday, Wednesday, and Friday.    Marland Kitchen BIDIL 20-37.5 MG tablet Take 1 tablet by mouth 3  times daily 270 tablet 0  . budesonide (PULMICORT) 0.25 MG/2ML nebulizer solution Take 0.25 mg by nebulization 2 (two) times daily.     . Cholecalciferol (VITAMIN D3) 5000 UNITS CAPS Take 5,000 Units by mouth daily.      Marland Kitchen docusate sodium (COLACE) 100 MG capsule Take 100 mg by mouth 2 (two) times daily as needed for mild constipation.    . ferrous sulfate 325 (65 FE) MG tablet Take 325 mg by mouth 2 (two) times daily with a meal.     . furosemide (LASIX) 40 MG tablet Take 1 tablet by mouth  daily 90 tablet 0  . HYDROcodone-acetaminophen (NORCO) 5-325 MG tablet Take 1 tablet by mouth every 6 (six) hours as needed for moderate pain. 30 tablet 0  . ipratropium-albuterol (DUONEB) 0.5-2.5 (3) MG/3ML SOLN Take 3 mLs by nebulization every 6 (six) hours as needed. For shortness of breath.     . levothyroxine (SYNTHROID, LEVOTHROID) 112 MCG tablet TAKE 1 TABLET (112 MCG TOTAL) BY MOUTH DAILY BEFORE BREAKFAST. 90 tablet 3  . lovastatin (MEVACOR) 40 MG tablet Take 1 tablet by mouth at  bedtime 90 tablet 0  . Multiple Vitamins-Minerals (MULTIVITAMINS THER. W/MINERALS) TABS Take 1 tablet by mouth daily.      . nitroGLYCERIN (NITROSTAT) 0.4 MG SL tablet Place 0.4 mg under the tongue every 5 (five) minutes as needed for chest pain.    . potassium chloride (K-DUR) 10 MEQ tablet Take 1 tablet (10 mEq total) by mouth daily. 90 tablet 2  . PROAIR HFA 108 (90 BASE) MCG/ACT inhaler Use 2 puffs every 6 hours  as needed for shortness of  breath 17 g 1  . warfarin (COUMADIN) 5 MG tablet Take 1 tablet by mouth  every day or as directed by anticoagulation clinic 90 tablet 0  . esomeprazole (NEXIUM) 40 MG capsule Take twice daily on an empty stomach 180 capsule 1  . isosorbide-hydrALAZINE (BIDIL) 20-37.5 MG tablet Take 1 tablet by mouth 3 (three) times daily. 21 tablet 0   No facility-administered medications prior to visit.    No Known Allergies  Family History  Problem Relation Age of Onset  . Leukemia Mother   . Kidney disease Mother     kidney removed   . Colon cancer Neg Hx   . Heart attack Brother   . Hypertension      family   . Cancer Father   . Heart disease Father   . Stomach cancer Sister   . Stomach cancer  Sister   . Early death Brother     Social History  Substance Use Topics  . Smoking status: Former Smoker -- 1.00 packs/day for 42 years    Types: Cigarettes    Quit date: 12/16/1996  . Smokeless tobacco: Never Used  . Alcohol Use: 0.6 oz/week    1 Cans of beer per week     Comment: 07/09/2014 "averages < 1 beer/wk", 11/26/14: denies ETOH    ROS: As per history of present illness, otherwise  negative  BP 140/42 mmHg  Pulse 64  Ht 5' 8.5" (1.74 m)  Wt 197 lb (89.359 kg)  BMI 29.51 kg/m2 Constitutional: Well-developed and well-nourished. No distress. HEENT: Normocephalic and atraumatic. Oropharynx is clear and moist. No oropharyngeal exudate. Conjunctivae are normal.  No scleral icterus. Neck: Neck supple. Trachea midline. Cardiovascular: Irregularly irregular Pulmonary/chest: Effort normal and breath sounds normal. No wheezing, rales or rhonchi. Abdominal: Soft, nontender, nondistended. Bowel sounds active throughout.  Extremities: no clubbing, cyanosis, or edema Neurological: Alert and oriented to person place and time. Skin: Skin is warm and dry.  Psychiatric: Normal mood and affect. Behavior is normal.  RELEVANT LABS AND IMAGING: CBC    Component Value Date/Time   WBC 11.1* 10/21/2015 1527   WBC 10.9* 09/24/2015 0824   WBC 11.4* 04/23/2015 0902   RBC 3.88* 10/21/2015 1527   RBC 3.76* 09/24/2015 0824   RBC 3.76* 04/23/2015 0902   HGB 12.5* 10/21/2015 1527   HGB 12.0* 04/23/2015 0902   HCT 36.8* 10/21/2015 1527   HCT 36.2* 09/24/2015 0824   HCT 325.1* 04/23/2015 0902   PLT 367 10/21/2015 1527   PLT 324 09/24/2015 0824   MCV 94.8 10/21/2015 1527   MCV 96 09/24/2015 0824   MCV 93.3 04/23/2015 0902   MCH 32.2 10/21/2015 1527   MCH 31.9 09/24/2015 0824   MCH 31.8* 04/23/2015 0902   MCHC 34.0 10/21/2015 1527   MCHC 33.1 09/24/2015 0824   MCHC 34.1 04/23/2015 0902   RDW 14.3 10/21/2015 1527   RDW 13.8 09/24/2015 0824   LYMPHSABS 1.2 10/21/2015 1527   LYMPHSABS 1.6  09/24/2015 0824   MONOABS 1.6* 10/21/2015 1527   EOSABS 0.2 10/21/2015 1527   EOSABS 0.2 09/24/2015 0824   EOSABS 0.3 10/30/2014 0817   BASOSABS 0.0 10/21/2015 1527   BASOSABS 0.1 09/24/2015 0824    CMP     Component Value Date/Time   NA 138 10/21/2015 1527   NA 144 09/24/2015 0824   K 4.1 10/21/2015 1527   CL 98 10/21/2015 1527   CO2 26 10/21/2015 1527   GLUCOSE 81 10/21/2015 1527   GLUCOSE 96 09/24/2015 0824   BUN 22 10/21/2015 1527   BUN 24 09/24/2015 0824   CREATININE 1.36* 10/21/2015 1527   CREATININE 1.28* 09/24/2015 0824   CALCIUM 9.6 10/21/2015 1527   PROT 6.5 10/30/2015 1437   PROT 6.6 01/08/2015 1025   ALBUMIN 4.0 10/30/2015 1437   ALBUMIN 3.3* 01/08/2015 1025   AST 20 10/30/2015 1437   ALT 14 10/30/2015 1437   ALKPHOS 55 10/30/2015 1437   BILITOT 0.4 10/30/2015 1437   BILITOT 0.5 01/08/2015 1025   GFRNONAA 54* 09/24/2015 0824   GFRAA 62 09/24/2015 0824   Ultrasound and CT scan of the abdomen and pelvis from March/April 2016 reviewed  Colonoscopy 11/29/2011 -- no polyps or cancers. No active bleeding. Exam to the cecum. Prep good. EGD 11/29/2011 -- superficial telangiectasia in the antrum, clo biopsy negative. Normal esophagus. Normal duodenum. VCE -- 01/05/2012 -- 1 AVM 5 minutes into the duodenum, blood in the lumen at 30 minutes with no discrete lesion. Otherwise negative. No obvious gastric antral vascular ectasia.  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  1. Right-sided abd pain/chronic cholecystitis -- his abdominal pain worsened by high-fat food is  very likely related to chronic cholecystitis. He follows with Dr. Hulen Skains for this issue and they are considering cholecystectomy later this year. My  impression is that this would help his abdominal pain. He understands that surgery is likely higher risk though he reports he has been cleared by cardiology. I recommended a low fat diet and follow-up with general surgery  2. GERD -- continue Prilosec 20 mg each morning and as needed ranitidine 150 mg each evening  3. 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 No plan for upper endoscopy or small bowel enteroscopy at this time. Continue oral iron replacement and monitor hemoglobin serially  4. CRC screening/history of polyps -- no polyps at complete colonoscopy in 2013. He does have a history of adenomatous polyps in the more distant past. Last procedure performed by Dr. Sharlett Iles and prep documented as good. To my knowledge she does not have a history of advanced adenoma and so after a normal exam new guidelines support repeating the exam in 10 years. Thus he may not require further screening/surveillance colonoscopy as these tests generally stop around age 32. Also colonoscopy is slightly higher risk for him in the setting of his chronic anticoagulation and atrial fibrillation.  5. Constipation -- taking magnesium citrate twice weekly. I would like to put him on something more consistent. Begin MiraLAX 17 g daily. May need to dose titrate. Can use magnesium citrate if Necessary for Severe Constipation.  Follow-up in one year, sooner if necessary 25 minutes spent with the patient today. Greater than 50% was spent in counseling and coordination of care with the patient     Cc:Claretta Fraise, [address] [place], [place] [address]

## 2015-12-03 ENCOUNTER — Other Ambulatory Visit: Payer: Self-pay | Admitting: Family Medicine

## 2015-12-03 ENCOUNTER — Telehealth: Payer: Self-pay | Admitting: Family Medicine

## 2015-12-03 NOTE — Telephone Encounter (Signed)
error 

## 2015-12-04 ENCOUNTER — Ambulatory Visit (INDEPENDENT_AMBULATORY_CARE_PROVIDER_SITE_OTHER): Payer: Medicare Other | Admitting: Pharmacist

## 2015-12-04 VITALS — BP 134/60 | HR 64

## 2015-12-04 DIAGNOSIS — I482 Chronic atrial fibrillation, unspecified: Secondary | ICD-10-CM

## 2015-12-04 LAB — POCT INR: INR: 1.7

## 2015-12-04 NOTE — Patient Instructions (Signed)
Anticoagulation Dose Instructions as of 12/04/2015      [name] [name] Tue Wed Thu Fri Sat   New Dose 2.5 mg 5 mg 5 mg 5 mg 2.5 mg 5 mg 5 mg    Description        Goal INR 1.8-2.0 Continue warfarin 5mg  - take 1/2 tablet sundays and thursdays.  Take 1 tablet all other days.         INR was 1.7 today

## 2015-12-07 ENCOUNTER — Emergency Department (HOSPITAL_COMMUNITY): Payer: Medicare Other

## 2015-12-07 ENCOUNTER — Encounter (HOSPITAL_COMMUNITY): Payer: Self-pay | Admitting: *Deleted

## 2015-12-07 ENCOUNTER — Inpatient Hospital Stay (HOSPITAL_COMMUNITY)
Admission: EM | Admit: 2015-12-07 | Discharge: 2015-12-13 | DRG: 871 | Disposition: A | Discharge status: Home / Self Care | Payer: Medicare Other | Attending: Internal Medicine | Admitting: Internal Medicine

## 2015-12-07 DIAGNOSIS — Z8249 Family history of ischemic heart disease and other diseases of the circulatory system: Secondary | ICD-10-CM

## 2015-12-07 DIAGNOSIS — I252 Old myocardial infarction: Secondary | ICD-10-CM | POA: Diagnosis not present

## 2015-12-07 DIAGNOSIS — J9601 Acute respiratory failure with hypoxia: Secondary | ICD-10-CM | POA: Diagnosis not present

## 2015-12-07 DIAGNOSIS — Z9981 Dependence on supplemental oxygen: Secondary | ICD-10-CM | POA: Diagnosis not present

## 2015-12-07 DIAGNOSIS — J441 Chronic obstructive pulmonary disease with (acute) exacerbation: Secondary | ICD-10-CM | POA: Diagnosis present

## 2015-12-07 DIAGNOSIS — R710 Precipitous drop in hematocrit: Secondary | ICD-10-CM

## 2015-12-07 DIAGNOSIS — I10 Essential (primary) hypertension: Secondary | ICD-10-CM | POA: Diagnosis present

## 2015-12-07 DIAGNOSIS — J189 Pneumonia, unspecified organism: Secondary | ICD-10-CM | POA: Diagnosis present

## 2015-12-07 DIAGNOSIS — I130 Hypertensive heart and chronic kidney disease with heart failure and stage 1 through stage 4 chronic kidney disease, or unspecified chronic kidney disease: Secondary | ICD-10-CM | POA: Diagnosis not present

## 2015-12-07 DIAGNOSIS — J440 Chronic obstructive pulmonary disease with acute lower respiratory infection: Secondary | ICD-10-CM | POA: Diagnosis present

## 2015-12-07 DIAGNOSIS — D528 Other folate deficiency anemias: Secondary | ICD-10-CM | POA: Diagnosis not present

## 2015-12-07 DIAGNOSIS — R05 Cough: Secondary | ICD-10-CM | POA: Diagnosis not present

## 2015-12-07 DIAGNOSIS — Z955 Presence of coronary angioplasty implant and graft: Secondary | ICD-10-CM

## 2015-12-07 DIAGNOSIS — R7989 Other specified abnormal findings of blood chemistry: Secondary | ICD-10-CM | POA: Diagnosis not present

## 2015-12-07 DIAGNOSIS — R0902 Hypoxemia: Secondary | ICD-10-CM

## 2015-12-07 DIAGNOSIS — I482 Chronic atrial fibrillation, unspecified: Secondary | ICD-10-CM | POA: Diagnosis present

## 2015-12-07 DIAGNOSIS — I129 Hypertensive chronic kidney disease with stage 1 through stage 4 chronic kidney disease, or unspecified chronic kidney disease: Secondary | ICD-10-CM | POA: Diagnosis present

## 2015-12-07 DIAGNOSIS — E039 Hypothyroidism, unspecified: Secondary | ICD-10-CM | POA: Diagnosis present

## 2015-12-07 DIAGNOSIS — T380X5A Adverse effect of glucocorticoids and synthetic analogues, initial encounter: Secondary | ICD-10-CM | POA: Diagnosis present

## 2015-12-07 DIAGNOSIS — A419 Sepsis, unspecified organism: Principal | ICD-10-CM | POA: Diagnosis present

## 2015-12-07 DIAGNOSIS — N183 Chronic kidney disease, stage 3 unspecified: Secondary | ICD-10-CM

## 2015-12-07 DIAGNOSIS — I340 Nonrheumatic mitral (valve) insufficiency: Secondary | ICD-10-CM | POA: Diagnosis not present

## 2015-12-07 DIAGNOSIS — D649 Anemia, unspecified: Secondary | ICD-10-CM

## 2015-12-07 DIAGNOSIS — D509 Iron deficiency anemia, unspecified: Secondary | ICD-10-CM | POA: Diagnosis present

## 2015-12-07 DIAGNOSIS — I5021 Acute systolic (congestive) heart failure: Secondary | ICD-10-CM | POA: Insufficient documentation

## 2015-12-07 DIAGNOSIS — T790XXA Air embolism (traumatic), initial encounter: Secondary | ICD-10-CM | POA: Diagnosis present

## 2015-12-07 DIAGNOSIS — I5022 Chronic systolic (congestive) heart failure: Secondary | ICD-10-CM | POA: Diagnosis not present

## 2015-12-07 DIAGNOSIS — J9600 Acute respiratory failure, unspecified whether with hypoxia or hypercapnia: Secondary | ICD-10-CM

## 2015-12-07 DIAGNOSIS — K31819 Angiodysplasia of stomach and duodenum without bleeding: Secondary | ICD-10-CM | POA: Diagnosis not present

## 2015-12-07 DIAGNOSIS — Z86010 Personal history of colonic polyps: Secondary | ICD-10-CM

## 2015-12-07 DIAGNOSIS — I248 Other forms of acute ischemic heart disease: Secondary | ICD-10-CM | POA: Diagnosis present

## 2015-12-07 DIAGNOSIS — K219 Gastro-esophageal reflux disease without esophagitis: Secondary | ICD-10-CM | POA: Diagnosis present

## 2015-12-07 DIAGNOSIS — R195 Other fecal abnormalities: Secondary | ICD-10-CM | POA: Diagnosis not present

## 2015-12-07 DIAGNOSIS — K2970 Gastritis, unspecified, without bleeding: Secondary | ICD-10-CM | POA: Diagnosis present

## 2015-12-07 DIAGNOSIS — N179 Acute kidney failure, unspecified: Secondary | ICD-10-CM

## 2015-12-07 DIAGNOSIS — D500 Iron deficiency anemia secondary to blood loss (chronic): Secondary | ICD-10-CM | POA: Diagnosis not present

## 2015-12-07 DIAGNOSIS — J9621 Acute and chronic respiratory failure with hypoxia: Secondary | ICD-10-CM | POA: Diagnosis not present

## 2015-12-07 DIAGNOSIS — Z7901 Long term (current) use of anticoagulants: Secondary | ICD-10-CM | POA: Diagnosis not present

## 2015-12-07 DIAGNOSIS — I2510 Atherosclerotic heart disease of native coronary artery without angina pectoris: Secondary | ICD-10-CM | POA: Diagnosis not present

## 2015-12-07 DIAGNOSIS — E785 Hyperlipidemia, unspecified: Secondary | ICD-10-CM | POA: Diagnosis not present

## 2015-12-07 DIAGNOSIS — J439 Emphysema, unspecified: Secondary | ICD-10-CM | POA: Diagnosis present

## 2015-12-07 DIAGNOSIS — R509 Fever, unspecified: Secondary | ICD-10-CM | POA: Diagnosis not present

## 2015-12-07 DIAGNOSIS — Z951 Presence of aortocoronary bypass graft: Secondary | ICD-10-CM | POA: Diagnosis not present

## 2015-12-07 DIAGNOSIS — D520 Dietary folate deficiency anemia: Secondary | ICD-10-CM | POA: Diagnosis not present

## 2015-12-07 DIAGNOSIS — R0602 Shortness of breath: Secondary | ICD-10-CM | POA: Diagnosis not present

## 2015-12-07 DIAGNOSIS — R748 Abnormal levels of other serum enzymes: Secondary | ICD-10-CM | POA: Diagnosis not present

## 2015-12-07 DIAGNOSIS — R778 Other specified abnormalities of plasma proteins: Secondary | ICD-10-CM

## 2015-12-07 DIAGNOSIS — I5023 Acute on chronic systolic (congestive) heart failure: Secondary | ICD-10-CM | POA: Diagnosis not present

## 2015-12-07 HISTORY — DX: Chronic kidney disease, unspecified: N18.9

## 2015-12-07 HISTORY — DX: Cholecystitis, unspecified: K81.9

## 2015-12-07 LAB — BASIC METABOLIC PANEL
Anion gap: 14 (ref 5–15)
BUN: 35 mg/dL — AB (ref 6–20)
CALCIUM: 8.8 mg/dL — AB (ref 8.9–10.3)
CHLORIDE: 106 mmol/L (ref 101–111)
CO2: 21 mmol/L — AB (ref 22–32)
CREATININE: 1.85 mg/dL — AB (ref 0.61–1.24)
GFR calc Af Amer: 39 mL/min — ABNORMAL LOW (ref >60)
GFR calc non Af Amer: 34 mL/min — ABNORMAL LOW (ref >60)
GLUCOSE: 117 mg/dL — AB (ref 65–99)
Potassium: 4.6 mmol/L (ref 3.5–5.1)
Sodium: 141 mmol/L (ref 135–145)

## 2015-12-07 LAB — TROPONIN I
TROPONIN I: 0.53 ng/mL — AB (ref <0.031)
TROPONIN I: 0.52 ng/mL — AB (ref <0.031)

## 2015-12-07 LAB — CBC
HEMATOCRIT: 28.8 % — AB (ref 39.0–52.0)
Hemoglobin: 9.5 g/dL — ABNORMAL LOW (ref 13.0–17.0)
MCH: 32.4 pg (ref 26.0–34.0)
MCHC: 33 g/dL (ref 30.0–36.0)
MCV: 98.3 fL (ref 78.0–100.0)
PLATELETS: 418 10*3/uL — AB (ref 150–400)
RBC: 2.93 MIL/uL — ABNORMAL LOW (ref 4.22–5.81)
RDW: 13.7 % (ref 11.5–15.5)
WBC: 30.4 10*3/uL — AB (ref 4.0–10.5)

## 2015-12-07 LAB — EXPECTORATED SPUTUM ASSESSMENT W REFEX TO RESP CULTURE

## 2015-12-07 LAB — BRAIN NATRIURETIC PEPTIDE: B NATRIURETIC PEPTIDE 5: 566.9 pg/mL — AB (ref 0.0–100.0)

## 2015-12-07 LAB — I-STAT TROPONIN, ED: TROPONIN I, POC: 0.29 ng/mL — AB (ref 0.00–0.08)

## 2015-12-07 LAB — EXPECTORATED SPUTUM ASSESSMENT W GRAM STAIN, RFLX TO RESP C

## 2015-12-07 LAB — HEMOGLOBIN AND HEMATOCRIT, BLOOD
HCT: 29 % — ABNORMAL LOW (ref 39.0–52.0)
Hemoglobin: 9.2 g/dL — ABNORMAL LOW (ref 13.0–17.0)

## 2015-12-07 LAB — PROTIME-INR
INR: 4.48 — ABNORMAL HIGH (ref 0.00–1.49)
Prothrombin Time: 41.4 seconds — ABNORMAL HIGH (ref 11.6–15.2)

## 2015-12-07 LAB — I-STAT CG4 LACTIC ACID, ED: Lactic Acid, Venous: 1.25 mmol/L (ref 0.5–2.0)

## 2015-12-07 LAB — STREP PNEUMONIAE URINARY ANTIGEN: Strep Pneumo Urinary Antigen: NEGATIVE

## 2015-12-07 MED ORDER — ASPIRIN 81 MG PO CHEW
324.0000 mg | CHEWABLE_TABLET | Freq: Once | ORAL | Status: AC
  Administered 2015-12-07: 324 mg via ORAL
  Filled 2015-12-07: qty 4

## 2015-12-07 MED ORDER — ADULT MULTIVITAMIN W/MINERALS CH
1.0000 | ORAL_TABLET | Freq: Every day | ORAL | Status: DC
  Administered 2015-12-08 – 2015-12-13 (×6): 1 via ORAL
  Filled 2015-12-07 (×6): qty 1

## 2015-12-07 MED ORDER — SODIUM CHLORIDE 0.9% FLUSH
3.0000 mL | Freq: Two times a day (BID) | INTRAVENOUS | Status: DC
Start: 2015-12-07 — End: 2015-12-13
  Administered 2015-12-10 – 2015-12-13 (×5): 3 mL via INTRAVENOUS

## 2015-12-07 MED ORDER — ALBUTEROL SULFATE (2.5 MG/3ML) 0.083% IN NEBU: 2.5000 mg | INHALATION_SOLUTION | Freq: Four times a day (QID) | RESPIRATORY_TRACT | Status: DC

## 2015-12-07 MED ORDER — DEXTROSE 5 % IV SOLN
1.0000 g | INTRAVENOUS | Status: DC
  Administered 2015-12-08 – 2015-12-12 (×5): 1 g via INTRAVENOUS
  Filled 2015-12-07 (×6): qty 10

## 2015-12-07 MED ORDER — SODIUM CHLORIDE 0.9 % IV SOLN
80.0000 mg | Freq: Once | INTRAVENOUS | Status: AC
  Administered 2015-12-07: 80 mg via INTRAVENOUS
  Filled 2015-12-07: qty 80

## 2015-12-07 MED ORDER — ENSURE ENLIVE PO LIQD
237.0000 mL | Freq: Two times a day (BID) | ORAL | Status: DC
  Administered 2015-12-08 – 2015-12-10 (×5): 237 mL via ORAL

## 2015-12-07 MED ORDER — GUAIFENESIN ER 600 MG PO TB12: 600.0000 mg | ORAL_TABLET | Freq: Two times a day (BID) | ORAL | Status: DC | PRN

## 2015-12-07 MED ORDER — AMLODIPINE BESYLATE 10 MG PO TABS
10.0000 mg | ORAL_TABLET | Freq: Every day | ORAL | Status: DC
  Administered 2015-12-08 – 2015-12-13 (×6): 10 mg via ORAL
  Filled 2015-12-07 (×6): qty 1

## 2015-12-07 MED ORDER — PANTOPRAZOLE SODIUM 40 MG IV SOLR
40.0000 mg | Freq: Two times a day (BID) | INTRAVENOUS | Status: DC
  Administered 2015-12-11 – 2015-12-12 (×4): 40 mg via INTRAVENOUS
  Filled 2015-12-07 (×4): qty 40

## 2015-12-07 MED ORDER — FERROUS SULFATE 325 (65 FE) MG PO TABS
325.0000 mg | ORAL_TABLET | Freq: Every day | ORAL | Status: DC
  Administered 2015-12-08 – 2015-12-13 (×5): 325 mg via ORAL
  Filled 2015-12-07 (×6): qty 1

## 2015-12-07 MED ORDER — THERA M PLUS PO TABS: 1.0000 | ORAL_TABLET | Freq: Every day | ORAL | Status: DC

## 2015-12-07 MED ORDER — HYDROCODONE-ACETAMINOPHEN 5-325 MG PO TABS
1.0000 | ORAL_TABLET | ORAL | Status: DC | PRN
  Administered 2015-12-09 – 2015-12-10 (×2): 1 via ORAL
  Administered 2015-12-11: 2 via ORAL
  Administered 2015-12-11: 1 via ORAL
  Administered 2015-12-12: 2 via ORAL
  Filled 2015-12-07 (×2): qty 2
  Filled 2015-12-07 (×3): qty 1
  Filled 2015-12-07: qty 2
  Filled 2015-12-07: qty 1

## 2015-12-07 MED ORDER — LEVOTHYROXINE SODIUM 112 MCG PO TABS
112.0000 ug | ORAL_TABLET | Freq: Every day | ORAL | Status: DC
  Administered 2015-12-08 – 2015-12-13 (×6): 112 ug via ORAL
  Filled 2015-12-07 (×6): qty 1

## 2015-12-07 MED ORDER — ACETAMINOPHEN 650 MG RE SUPP: 650.0000 mg | Freq: Four times a day (QID) | RECTAL | Status: DC | PRN

## 2015-12-07 MED ORDER — BUDESONIDE 0.25 MG/2ML IN SUSP
0.2500 mg | Freq: Two times a day (BID) | RESPIRATORY_TRACT | Status: DC
  Administered 2015-12-07 – 2015-12-13 (×12): 0.25 mg via RESPIRATORY_TRACT
  Filled 2015-12-07 (×12): qty 2

## 2015-12-07 MED ORDER — DEXTROSE 5 % IV SOLN
500.0000 mg | INTRAVENOUS | Status: DC
  Administered 2015-12-08 – 2015-12-11 (×4): 500 mg via INTRAVENOUS
  Filled 2015-12-07 (×3): qty 500

## 2015-12-07 MED ORDER — DOCUSATE SODIUM 100 MG PO CAPS: 100.0000 mg | ORAL_CAPSULE | Freq: Two times a day (BID) | ORAL | Status: DC | PRN

## 2015-12-07 MED ORDER — VITAMIN K1 10 MG/ML IJ SOLN
10.0000 mg | Freq: Once | INTRAVENOUS | Status: AC
  Administered 2015-12-07: 10 mg via INTRAVENOUS
  Filled 2015-12-07: qty 1

## 2015-12-07 MED ORDER — SODIUM CHLORIDE 0.9 % IV BOLUS (SEPSIS)
500.0000 mL | Freq: Once | INTRAVENOUS | Status: AC
  Administered 2015-12-07: 500 mL via INTRAVENOUS

## 2015-12-07 MED ORDER — IPRATROPIUM-ALBUTEROL 0.5-2.5 (3) MG/3ML IN SOLN
3.0000 mL | Freq: Four times a day (QID) | RESPIRATORY_TRACT | Status: DC
  Administered 2015-12-08 – 2015-12-09 (×5): 3 mL via RESPIRATORY_TRACT
  Filled 2015-12-07 (×5): qty 3

## 2015-12-07 MED ORDER — IPRATROPIUM BROMIDE 0.02 % IN SOLN: 0.5000 mg | Freq: Four times a day (QID) | RESPIRATORY_TRACT | Status: DC

## 2015-12-07 MED ORDER — FLUTICASONE PROPIONATE 50 MCG/ACT NA SUSP
1.0000 | Freq: Every day | NASAL | Status: DC
  Administered 2015-12-07 – 2015-12-13 (×6): 1 via NASAL
  Filled 2015-12-07: qty 16

## 2015-12-07 MED ORDER — DEXTROSE 5 % IV SOLN
1.0000 g | Freq: Once | INTRAVENOUS | Status: AC
  Administered 2015-12-07: 1 g via INTRAVENOUS
  Filled 2015-12-07: qty 10

## 2015-12-07 MED ORDER — ALBUTEROL SULFATE (2.5 MG/3ML) 0.083% IN NEBU
2.5000 mg | INHALATION_SOLUTION | RESPIRATORY_TRACT | Status: DC | PRN
  Administered 2015-12-07 – 2015-12-09 (×6): 2.5 mg via RESPIRATORY_TRACT
  Filled 2015-12-07 (×6): qty 3

## 2015-12-07 MED ORDER — PRAVASTATIN SODIUM 20 MG PO TABS
20.0000 mg | ORAL_TABLET | Freq: Every day | ORAL | Status: DC
  Administered 2015-12-08 – 2015-12-12 (×5): 20 mg via ORAL
  Filled 2015-12-07 (×5): qty 1

## 2015-12-07 MED ORDER — AZITHROMYCIN 500 MG IV SOLR
500.0000 mg | Freq: Once | INTRAVENOUS | Status: AC
  Administered 2015-12-07: 500 mg via INTRAVENOUS
  Filled 2015-12-07 (×2): qty 500

## 2015-12-07 MED ORDER — SODIUM CHLORIDE 0.9 % IV SOLN
INTRAVENOUS | Status: DC
  Administered 2015-12-07 – 2015-12-09 (×4): via INTRAVENOUS

## 2015-12-07 MED ORDER — ALBUTEROL SULFATE (2.5 MG/3ML) 0.083% IN NEBU
5.0000 mg | INHALATION_SOLUTION | Freq: Once | RESPIRATORY_TRACT | Status: AC
  Administered 2015-12-07: 5 mg via RESPIRATORY_TRACT
  Filled 2015-12-07: qty 6

## 2015-12-07 MED ORDER — VITAMIN D 1000 UNITS PO TABS
5000.0000 [IU] | ORAL_TABLET | Freq: Every day | ORAL | Status: DC
  Administered 2015-12-08 – 2015-12-13 (×6): 5000 [IU] via ORAL
  Filled 2015-12-07 (×6): qty 5

## 2015-12-07 MED ORDER — NITROGLYCERIN 0.4 MG SL SUBL: 0.4000 mg | SUBLINGUAL_TABLET | SUBLINGUAL | Status: DC | PRN

## 2015-12-07 MED ORDER — ACETAMINOPHEN 325 MG PO TABS
650.0000 mg | ORAL_TABLET | Freq: Four times a day (QID) | ORAL | Status: DC | PRN
  Administered 2015-12-08 – 2015-12-11 (×2): 650 mg via ORAL
  Filled 2015-12-07 (×2): qty 2

## 2015-12-07 MED ORDER — VITAMIN D3 125 MCG (5000 UT) PO CAPS: 5000.0000 [IU] | ORAL_CAPSULE | Freq: Every day | ORAL | Status: DC

## 2015-12-07 MED ORDER — ISOSORB DINITRATE-HYDRALAZINE 20-37.5 MG PO TABS
1.0000 | ORAL_TABLET | Freq: Two times a day (BID) | ORAL | Status: DC
  Administered 2015-12-07 – 2015-12-13 (×12): 1 via ORAL
  Filled 2015-12-07 (×12): qty 1

## 2015-12-07 MED ORDER — SODIUM CHLORIDE 0.9 % IV SOLN
8.0000 mg/h | INTRAVENOUS | Status: AC
  Administered 2015-12-07 – 2015-12-10 (×7): 8 mg/h via INTRAVENOUS
  Filled 2015-12-07 (×12): qty 80

## 2015-12-07 NOTE — ED Provider Notes (Addendum)
CSN: [id number]     Arrival date & time 12/07/15  1043 History   First MD Initiated Contact with Patient 12/07/15 1050     Chief Complaint  Patient presents with  . Shortness of Breath    HPI Patient presents to the emergency room with complaints of cough, , fever, and poor appetite. Symptoms started about a week ago. He started coughing bringing up mucus. The patient has noticed some increasing shortness of breath. Normally he uses oxygen at night but has felt more short of breath during the day. The patient denies any trouble has noticed some chest discomfort with coughing. He denies any leg swelling. Patient has been trying to manage the symptoms at home but the family members finally convinced him to come in. Past Medical History  Diagnosis Date  . Unspecified essential hypertension   . Anemia   . Asthma   . Hepatomegaly   . Esophageal reflux   . Other and unspecified coagulation defects   . Hiatal hernia   . Personal history of colonic polyps 05/29/2010    TUBULAR ADENOMA  . CAD (coronary artery disease)   . Hypothyroidism   . Gout   . Hyperlipidemia   . Gastric antral vascular ectasia 2013  . Atrial fibrillation ([place])   . History of blood transfusion ~ 2012    "blood count dropped; had to get 3 units"  . Arthritis     "hips; back" (07/09/2014)  . On home oxygen therapy     "2L at night" (07/09/2014)  . COPD (chronic obstructive pulmonary disease) ([place])   . Gallstones    Past Surgical History  Procedure Laterality Date  . Umbilical hernia repair    . Coronary angioplasty with stent placement  ~ 2008; 07/08/2014    "1 + 3"  . 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 Hernia repair    . Umbilical hernia repair  1970's  . Left and right heart catheterization with coronary/graft angiogram N/A 07/09/2014    Procedure: LEFT AND RIGHT HEART CATHETERIZATION WITH Beatrix Fetters;  Surgeon: Sinclair Grooms, MD;  Location: [facility] CATH LAB;  Service: Cardiovascular;   Laterality: N/A;  . Colonoscopy  2013    Dr. Sharlett Iles: no polyps or evidence of active bleeding  . Esophagogastroduodenoscopy  2013    Dr. 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 Givens capsule study  2013    Dr. Sharlett Iles: AVM at 5 minutes beyond first duodenal image. If persistent IDA, bleeding, recommend enteroscopy with ablation    Family History  Problem Relation Age of Onset  . Leukemia Mother   . Kidney disease Mother     kidney removed   . Colon cancer Neg Hx   . Heart attack Brother   . Hypertension      family   . Cancer Father   . Heart disease Father   . Stomach cancer Sister   . Stomach cancer Sister   . Early death Brother    Social History  Substance Use Topics  . Smoking status: Former Smoker -- 1.00 packs/day for 42 years    Types: Cigarettes    Quit date: 12/16/1996  . Smokeless tobacco: Never Used  . Alcohol Use: 0.6 oz/week    1 Cans of beer per week     Comment: 07/09/2014 "averages < 1 beer/wk", 11/26/14: denies ETOH    Review of Systems  Respiratory: Positive for cough.  Spitting up blood  Gastrointestinal: Negative for blood in stool and anal bleeding.  All other systems reviewed and are negative.     Allergies  Review of patient's allergies indicates no known allergies.  Home Medications   Prior to Admission medications   Medication Sig Start Date End Date Taking? Authorizing Provider  acetaminophen (TYLENOL) 500 MG tablet Take 1,000 mg by mouth every 6 (six) hours as needed. For pain.     Historical Provider, MD  amLODipine (NORVASC) 10 MG tablet Take 1 tablet (10 mg total) by mouth daily. 10/28/15   Belva Crome, MD  aspirin EC 81 MG tablet Take 1 tablet (81 mg total) by mouth every Monday, Wednesday, and Friday. 10/08/14   Belva Crome, MD  BIDIL 20-37.5 MG tablet Take 1 tablet by mouth 3  times daily 11/24/15   Claretta Fraise, MD  budesonide (PULMICORT) 0.25 MG/2ML nebulizer solution  Take 0.25 mg by nebulization 2 (two) times daily.     Historical Provider, MD  Cholecalciferol (VITAMIN D3) 5000 UNITS CAPS Take 5,000 Units by mouth daily.     Historical Provider, MD  docusate sodium (COLACE) 100 MG capsule Take 100 mg by mouth 2 (two) times daily as needed for mild constipation.    Historical Provider, MD  ferrous sulfate 325 (65 FE) MG tablet Take 325 mg by mouth 2 (two) times daily with a meal.     Historical Provider, MD  furosemide (LASIX) 40 MG tablet Take 1 tablet by mouth  daily 11/24/15   Claretta Fraise, MD  HYDROcodone-acetaminophen (NORCO) 5-325 MG tablet Take 1 tablet by mouth every 6 (six) hours as needed for moderate pain. Patient not taking: Reported on 12/04/2015 11/02/15   Claretta Fraise, MD  ipratropium-albuterol (DUONEB) 0.5-2.5 (3) MG/3ML SOLN Take 3 mLs by nebulization every 6 (six) hours as needed. For shortness of breath.     Historical Provider, MD  levothyroxine (SYNTHROID, LEVOTHROID) 112 MCG tablet TAKE 1 TABLET (112 MCG TOTAL) BY MOUTH DAILY BEFORE BREAKFAST. 04/25/15   Claretta Fraise, MD  lovastatin (MEVACOR) 40 MG tablet Take 1 tablet by mouth at  bedtime 12/04/15   Claretta Fraise, MD  Multiple Vitamins-Minerals (MULTIVITAMINS THER. W/MINERALS) TABS Take 1 tablet by mouth daily.      Historical Provider, MD  nitroGLYCERIN (NITROSTAT) 0.4 MG SL tablet Place 0.4 mg under the tongue every 5 (five) minutes as needed for chest pain.    Historical Provider, MD  polyethylene glycol powder (GLYCOLAX/MIRALAX) powder Take 1 capful (17 grams) dissolved in at least 8 ounces water/juice once daily. 12/02/15   Jerene Bears, MD  potassium chloride (K-DUR) 10 MEQ tablet Take 1 tablet (10 mEq total) by mouth daily. 05/30/15   Belva Crome, MD  PROAIR HFA 108 [phone] Base) MCG/ACT inhaler Use 2 puffs every 6 hours  as needed for shortness of  breath 12/04/15   Claretta Fraise, MD  warfarin (COUMADIN) 5 MG tablet Take 1 tablet by mouth  every day or as directed by anticoagulation clinic  11/05/15   Claretta Fraise, MD   BP 130/55 mmHg  Pulse 77  Temp(Src) 98.6 F (37 C) (Oral)  Resp 18  SpO2 93% Physical Exam  Constitutional: No distress.  HENT:  Head: Normocephalic and atraumatic.  Right Ear: External ear normal.  Left Ear: External ear normal.  Eyes: Conjunctivae are normal. Right eye exhibits no discharge. Left eye exhibits no discharge. No scleral icterus.  Neck: Neck supple. No tracheal deviation present.  Cardiovascular: Normal  rate, regular rhythm and intact distal pulses.   Pulmonary/Chest: Effort normal. No stridor. No respiratory distress. He has wheezes. He has no rales.  Abdominal: Soft. Bowel sounds are normal. He exhibits no distension. There is no tenderness. There is no rebound and no guarding.  Musculoskeletal: He exhibits no edema or tenderness.  Neurological: He is alert. He has normal strength. No cranial nerve deficit (no facial droop, extraocular movements intact, no slurred speech) or sensory deficit. He exhibits normal muscle tone. He displays no seizure activity. Coordination normal.  Skin: Skin is warm and dry. No rash noted. He is not diaphoretic.  Psychiatric: He has a normal mood and affect.  Nursing note and vitals reviewed.   ED Course  Procedures (including critical care time)  Medications  cefTRIAXone (ROCEPHIN) 1 g in dextrose 5 % 50 mL IVPB (not administered)  azithromycin (ZITHROMAX) 500 mg in dextrose 5 % 250 mL IVPB (not administered)  albuterol (PROVENTIL) (2.5 MG/3ML) 0.083% nebulizer solution 5 mg (5 mg Nebulization Given 12/07/15 1201)    Labs Review Labs Reviewed  CBC - Abnormal; Notable for the following:    WBC 30.4 (*)    RBC 2.93 (*)    Hemoglobin 9.5 (*)    HCT 28.8 (*)    Platelets 418 (*)    All other components within normal limits  BRAIN NATRIURETIC PEPTIDE - Abnormal; Notable for the following:    B Natriuretic Peptide 566.9 (*)    All other components within normal limits  BASIC METABOLIC PANEL - Abnormal;  Notable for the following:    CO2 21 (*)    Glucose, Bld 117 (*)    BUN 35 (*)    Creatinine, Ser 1.85 (*)    Calcium 8.8 (*)    GFR calc non Af Amer 34 (*)    GFR calc Af Amer 39 (*)    All other components within normal limits  I-STAT TROPOININ, ED - Abnormal; Notable for the following:    Troponin i, poc 0.29 (*)    All other components within normal limits    Imaging Review Dg Chest 2 View  12/07/2015  CLINICAL DATA:  Cough. Weakness. Fever. Shortness of breath COPD. Asthma. Coronary artery disease. EXAM: CHEST  2 VIEW COMPARISON:  12/25/2014 FINDINGS: Prior median sternotomy. Midline trachea. Moderate cardiomegaly. Mild right hemidiaphragm elevation. No pleural effusion or pneumothorax. Diffuse peribronchial thickening. Left upper lobe patchy airspace disease. IMPRESSION: Left upper lobe airspace disease, most consistent with pneumonia. Followup PA and lateral chest X-ray is recommended in 3-4 weeks following trial of antibiotic therapy to ensure resolution and exclude underlying malignancy. Underlying cardiomegaly with chronic interstitial thickening, likely related to COPD/ chronic bronchitis. Electronically Signed   By: Abigail Miyamoto M.D.   On: 12/07/2015 11:40   I have personally reviewed and evaluated these images and lab results as part of my medical decision-making.   EKG Interpretation   Date/Time:  Sunday December 07 2015 11:01:44 EST Ventricular Rate:  90 PR Interval:    QRS Duration: 148 QT Interval:  379 QTC Calculation: 464 R Axis:   113 Text Interpretation:  Atrial fibrillation Ventricular premature complex  Right bundle branch block ST depr, consider ischemia, anterolateral lds ,  increased since last tracing Poor data quality Confirmed by Laylee Schooley  MD-J,  Mikaylah Libbey [id number]) on 12/07/2015 11:36:19 AM      MDM   Final diagnoses:  CAP (community acquired pneumonia)  Elevated troponin  Anemia, unspecified anemia type    Patient's laboratory tests  show a significantly  elevated white blood cell count. Patient also has focal airspace disease consistent with pneumonia. Patient has not been in the hospital recently. Plan on treatment for a kidney acquired pneumonia.  He has chronic renal sufficiency are reviewed his BUN and creatinine are elevated. There is most likely component of dehydration with his symptoms.  EKG shows possibility of some ischemic changes. He also has an elevated troponin.  Patient is not having any cardiac sounding chest pain. I suspect the symptoms may be related to increased cardiac demand.  We'll continue to monitor closely  Patient will be admitted to the hospital for further treatment.    Dorie Rank, MD 12/07/15 1321  D/w Dr Luther Parody.  Recommends continuing serial troponin.  Consult cardiology for increasing troponin, other concerns.  Reviewed   Dorie Rank, MD 12/07/15 [phone]

## 2015-12-07 NOTE — ED Notes (Signed)
Dr. Waldron Labs made aware of Trop level of 0.53.

## 2015-12-07 NOTE — H&P (Addendum)
Patient Demographics  [name] [name], is a 75 y.o. male  MRN: [id number]   DOB - [birth year]  Admit Date - 12/07/2015  Outpatient Primary MD for the patient is Claretta Fraise, MD   With History of -  Past Medical History  Diagnosis Date  . Unspecified essential hypertension   . Anemia   . Asthma   . Hepatomegaly   . Esophageal reflux   . Other and unspecified coagulation defects   . Hiatal hernia   . Personal history of colonic polyps 05/29/2010    TUBULAR ADENOMA  . CAD (coronary artery disease)   . Hypothyroidism   . Gout   . Hyperlipidemia   . Gastric antral vascular ectasia 2013  . Atrial fibrillation ([place])   . History of blood transfusion ~ 2012    "blood count dropped; had to get 3 units"  . Arthritis     "hips; back" (07/09/2014)  . On home oxygen therapy     "2L at night" (07/09/2014)  . COPD (chronic obstructive pulmonary disease) ([place])   . Gallstones       Past Surgical History  Procedure Laterality Date  . Umbilical hernia repair    . Coronary angioplasty with stent placement  ~ 2008; 07/08/2014    "1 + 3"  . 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 Hernia repair    . Umbilical hernia repair  1970's  . Left and right heart catheterization with coronary/graft angiogram N/A 07/09/2014    Procedure: LEFT AND RIGHT HEART CATHETERIZATION WITH Beatrix Fetters;  Surgeon: Sinclair Grooms, MD;  Location: [facility] CATH LAB;  Service: Cardiovascular;  Laterality: N/A;  . Colonoscopy  2013    Dr. Sharlett Iles: no polyps or evidence of active bleeding  . Esophagogastroduodenoscopy  2013    Dr. 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 Givens capsule study  2013    Dr. Sharlett Iles: AVM at 5 minutes beyond first duodenal image. If persistent IDA, bleeding, recommend enteroscopy with ablation     in for   Chief Complaint  Patient presents with  . Shortness of Breath     HPI  [name] [name]  is a 75 y.o.  male, has a history of atrial fibrillation and heart disease ,gastroesophageal reflux disease hypertension hyperlipidemia , presents with complaints of cough and shortness of breath over last week, patient reports symptoms started with cough last week, fever, poor appetite, started to develop dyspnea, productive, with blood-tinged sputum, chest discomfort with cough, in ED workup was significant for leukocytosis of 30,000, acute on chronic renal failure, with creatinine of 2.8, afebrile in ED, hypoxic 86% on room air, chest x-ray significant for left upper lobe airspace disease, started on Rocephin and azithromycin, as well workup significant for elevated troponins, denies any chest pain, patient on warfarin for A. fib, with supratherapeutic INR at 4.4, and with anemia with hemoglobin of 9.5, baseline is 12. Denies any melena, hematochezia, or coffee-ground emesis.    Review of Systems    In addition to the HPI above, No Fever-chills, No Headache, No changes with Vision or hearing, No problems swallowing food or Liquids, No Chest pain, repots Cough , with blood tinged sputum, and  Shortness of Breath, No Abdominal pain, No Nausea or Vommitting, Bowel movements are regular, No Blood in stool or Urine, No dysuria, No new skin rashes or bruises, No new joints pains-aches,  Generalized weakness and fatigue No recent weight  gain or loss, No polyuria, polydypsia or polyphagia, No significant Mental Stressors.  A full 10 point Review of Systems was done, except as stated above, all other Review of Systems were negative.   Social History Social History  Substance Use Topics  . Smoking status: Former Smoker -- 1.00 packs/day for 42 years    Types: Cigarettes    Quit date: 12/16/1996  . Smokeless tobacco: Never Used  . Alcohol Use: 0.6 oz/week    1 Cans of beer per week     Comment: 07/09/2014 "averages < 1 beer/wk", 11/26/14: denies ETOH    Family History Family History  Problem Relation  Age of Onset  . Leukemia Mother   . Kidney disease Mother     kidney removed   . Colon cancer Neg Hx   . Heart attack Brother   . Hypertension      family   . Cancer Father   . Heart disease Father   . Stomach cancer Sister   . Stomach cancer Sister   . Early death Brother      Prior to Admission medications   Medication Sig Start Date End Date Taking? Authorizing Provider  acetaminophen (TYLENOL) 500 MG tablet Take 1,000 mg by mouth every 6 (six) hours as needed for mild pain. For pain.   Yes Historical Provider, MD  amLODipine (NORVASC) 10 MG tablet Take 1 tablet (10 mg total) by mouth daily. 10/28/15  Yes Belva Crome, MD  aspirin EC 81 MG tablet Take 1 tablet (81 mg total) by mouth every Monday, Wednesday, and Friday. 10/08/14  Yes Belva Crome, MD  BIDIL 20-37.5 MG tablet Take 1 tablet by mouth 3  times daily 11/24/15  Yes Claretta Fraise, MD  budesonide (PULMICORT) 0.25 MG/2ML nebulizer solution Take 0.25 mg by nebulization 2 (two) times daily.    Yes Historical Provider, MD  Cholecalciferol (VITAMIN D3) 5000 UNITS CAPS Take 5,000 Units by mouth daily.    Yes Historical Provider, MD  docusate sodium (COLACE) 100 MG capsule Take 100 mg by mouth 2 (two) times daily as needed for mild constipation.   Yes Historical Provider, MD  ferrous sulfate 325 (65 FE) MG tablet Take 325 mg by mouth daily with breakfast.    Yes Historical Provider, MD  furosemide (LASIX) 40 MG tablet Take 1 tablet by mouth  daily 11/24/15  Yes Claretta Fraise, MD  guaiFENesin (MUCINEX) 600 MG 12 hr tablet Take 600 mg by mouth 2 (two) times daily as needed for cough or to loosen phlegm.   Yes Historical Provider, MD  HYDROcodone-acetaminophen (NORCO) 5-325 MG tablet Take 1 tablet by mouth every 6 (six) hours as needed for moderate pain. 11/02/15  Yes Claretta Fraise, MD  ipratropium-albuterol (DUONEB) 0.5-2.5 (3) MG/3ML SOLN Take 3 mLs by nebulization every 6 (six) hours as needed. For shortness of breath.    Yes Historical  Provider, MD  levothyroxine (SYNTHROID, LEVOTHROID) 112 MCG tablet TAKE 1 TABLET (112 MCG TOTAL) BY MOUTH DAILY BEFORE BREAKFAST. 04/25/15  Yes Claretta Fraise, MD  lovastatin (MEVACOR) 40 MG tablet Take 1 tablet by mouth at  bedtime Patient taking differently: Take 1 tablet by mouth in the morning 12/04/15  Yes Claretta Fraise, MD  Multiple Vitamins-Minerals (MULTIVITAMINS THER. W/MINERALS) TABS Take 1 tablet by mouth daily.     Yes Historical Provider, MD  nitroGLYCERIN (NITROSTAT) 0.4 MG SL tablet Place 0.4 mg under the tongue every 5 (five) minutes as needed for chest pain.   Yes Historical Provider,  MD  polyethylene glycol powder (GLYCOLAX/MIRALAX) powder Take 1 capful (17 grams) dissolved in at least 8 ounces water/juice once daily. 12/02/15  Yes Jerene Bears, MD  potassium chloride (K-DUR) 10 MEQ tablet Take 1 tablet (10 mEq total) by mouth daily. 05/30/15  Yes Belva Crome, MD  PROAIR HFA 108 [id number] Base) MCG/ACT inhaler Use 2 puffs every 6 hours  as needed for shortness of  breath 12/04/15  Yes Claretta Fraise, MD  warfarin (COUMADIN) 5 MG tablet Take 1 tablet by mouth  every day or as directed by anticoagulation clinic Patient taking differently: Take 1 tablet by mouth Mon, Tues, Wed, Fri, and Sat then takes 0.5 tablet on Thurs and Sun. 11/05/15  Yes Claretta Fraise, MD    No Known Allergies  Physical Exam  Vitals  Blood pressure 157/85, pulse 84, temperature 98.6 F (37 C), temperature source Oral, resp. rate 21, SpO2 92 %.   1. General ill-appearing male lying in bed in NAD,  2. Normal affect and insight, Not Suicidal or Homicidal, Awake Alert, Oriented X 3.  3. No F.N deficits, ALL C.Nerves Intact, Strength 5/5 all 4 extremities, Sensation intact all 4 extremities, Plantars down going.  4. Ears and Eyes appear Normal, Conjunctivae clear, PERRLA. Moist Oral Mucosa.  5. Supple Neck, No JVD, No cervical lymphadenopathy appriciated, No Carotid Bruits.  6. Symmetrical Chest wall movement, Good  air movement bilaterally, no wheezing  7. RRR, No Gallops, Rubs or Murmurs, No Parasternal Heave.  8. Positive Bowel Sounds, Abdomen Soft, No tenderness, No organomegaly appriciated,No rebound -guarding or rigidity.  9.  No Cyanosis, Normal Skin Turgor, No Skin Rash or Bruise.  10. Good muscle tone,  joints appear normal , no effusions, Normal ROM.    Data Review  CBC  Recent Labs Lab 12/07/15 1056  WBC 30.4*  HGB 9.5*  HCT 28.8*  PLT 418*  MCV 98.3  MCH 32.4  MCHC 33.0  RDW 13.7   ------------------------------------------------------------------------------------------------------------------  Chemistries   Recent Labs Lab 12/07/15 1056  NA 141  K 4.6  CL 106  CO2 21*  GLUCOSE 117*  BUN 35*  CREATININE 1.85*  CALCIUM 8.8*   ------------------------------------------------------------------------------------------------------------------ estimated creatinine clearance is 37.2 mL/min (by C-G formula based on Cr of 1.85). ------------------------------------------------------------------------------------------------------------------ No results for input(s): TSH, T4TOTAL, T3FREE, THYROIDAB in the last 72 hours.  Invalid input(s): FREET3   Coagulation profile  Recent Labs Lab 12/04/15 0946 12/07/15 1052  INR 1.7 4.48*   ------------------------------------------------------------------------------------------------------------------- No results for input(s): DDIMER in the last 72 hours. -------------------------------------------------------------------------------------------------------------------  Cardiac Enzymes No results for input(s): CKMB, TROPONINI, MYOGLOBIN in the last 168 hours.  Invalid input(s): CK ------------------------------------------------------------------------------------------------------------------ Invalid input(s):  POCBNP   ---------------------------------------------------------------------------------------------------------------  Urinalysis    Component Value Date/Time   COLORURINE AMBER* 01/06/2015 1543   APPEARANCEUR CLEAR 01/06/2015 1543   LABSPEC 1.023 01/06/2015 1543   PHURINE 5.0 01/06/2015 1543   GLUCOSEU NEGATIVE 01/06/2015 1543   HGBUR NEGATIVE 01/06/2015 1543   BILIRUBINUR SMALL* 01/06/2015 1543   BILIRUBINUR NEG 07/16/2013 1059   KETONESUR NEGATIVE 01/06/2015 1543   PROTEINUR 30* 01/06/2015 1543   PROTEINUR NEG 07/16/2013 1059   UROBILINOGEN 0.2 01/06/2015 1543   UROBILINOGEN negative 07/16/2013 1059   NITRITE NEGATIVE 01/06/2015 1543   NITRITE NEG 07/16/2013 1059   LEUKOCYTESUR SMALL* 01/06/2015 1543    ----------------------------------------------------------------------------------------------------------------  Imaging results:   Dg Chest 2 View  12/07/2015  CLINICAL DATA:  Cough. Weakness. Fever. Shortness of breath COPD. Asthma. Coronary artery disease. EXAM: CHEST  2 VIEW COMPARISON:  12/25/2014 FINDINGS: Prior median sternotomy. Midline trachea. Moderate cardiomegaly. Mild right hemidiaphragm elevation. No pleural effusion or pneumothorax. Diffuse peribronchial thickening. Left upper lobe patchy airspace disease. IMPRESSION: Left upper lobe airspace disease, most consistent with pneumonia. Followup PA and lateral chest X-ray is recommended in 3-4 weeks following trial of antibiotic therapy to ensure resolution and exclude underlying malignancy. Underlying cardiomegaly with chronic interstitial thickening, likely related to COPD/ chronic bronchitis. Electronically Signed   By: Abigail Miyamoto M.D.   On: 12/07/2015 11:40      Assessment & Plan  Active Problems:   Iron deficiency anemia   Essential hypertension   COPD (chronic obstructive pulmonary disease) with emphysema (HCC)   Chronic systolic CHF (congestive heart failure) (HCC)   Anticoagulant long-term use    Atrial fibrillation, chronic (HCC)   CAP (community acquired pneumonia)   Pneumathemia ([place])   Acute renal failure superimposed on stage 3 chronic kidney disease ([place])   Sepsis secondary to pneumonia - Patient presents with leukocytosis, tachypnea and tachycardia, secondary to community-acquired pneumonia - Pneumonia or septated last on admission, will check sputum culture, blood culture, strep pneumoniae antigen, and will start on Rocephin and azithromycin  COPD - No active wheezing, will continue with Pulmicort, will continue with DuoNeb every 6 hours and when necessary albuterol  Acute on chronic hypoxic respiratory failure - Patient on 2 L nasal cannula at bedtime, currently hypoxic on room air daytime, this is most likely secondary to pneumonia - Wean oxygen as tolerated  Elevated troponin - ED physician discussed with cardiology, as is most likely in the setting of acute renal failure and demand ischemia from hypoxia and sepsis, and tinea to cycle troponins and monitor on telemetry  Chronic systolic CHF - Appears to be euvolemic, continue to monitor  A. Fib - Heart rate controlled, hold warfarin for supratherapeutic INR  Hypertension - Continue with home medication  Acute on chronic kidney disease stage III - Prerenal, continue with gentle hydration, monitor closely giving his history of CHF  Anemia - Patient with known history of anemia of iron deficiency, with significant hemoglobin drop, so will hold aspirin, will hold warfarin, pending Hemoccult stool.  Chronic anticoagulation with supratherapeutic INR - Intended to hold warfarin, her hemoglobin closely, this point no indication for vitamin K.  Addendum: - Patient with Hemoccult positive stool(on iron tablet), will start on Protonix drip, will give 10 of IV vitamin K for his supratherapeutic INR.  DVT Prophylaxis on warfarin  AM Labs Ordered, also please review Full Orders  Family Communication: Admission,  patients condition and plan of care including tests being ordered have been discussed with the patient and wife and daughter who indicate understanding and agree with the plan and Code Status.  Code Status Full  Likely DC to  home  Condition GUARDED    Time spent in minutes : 55 minutes    Ilani Otterson M.D on 12/07/2015 at 2:22 PM  Between 7am to 7pm - Pager - [phone]  After 7pm go to [email or web address] - password [email or web address]  And look for the night coverage person covering me after hours  [facility] Office  [phone]

## 2015-12-07 NOTE — Progress Notes (Signed)
PHARMACY NOTE -  ANTIBIOTIC RENAL DOSE ADJUSTMENT   Request received for Pharmacy to assist with antibiotic renal dose adjustment.  Patient has been initiated on ceftriaxone and azithromycin for CAP. SCr 1.85, estimated CrCl 37 ml/min Current dosage is appropriate and need for further dosage adjustment appears unlikely at present. Will sign off at this time.  Please reconsult if a change in clinical status warrants re-evaluation of dosage.  Peggyann Juba, PharmD, BCPS Pager: [phone] 12/07/2015 4:41 PM

## 2015-12-07 NOTE — ED Notes (Signed)
Pt states about of week of productive cough, weakness, occ fever. Poor appetite, no energy

## 2015-12-08 ENCOUNTER — Inpatient Hospital Stay (HOSPITAL_COMMUNITY): Payer: Medicare Other

## 2015-12-08 ENCOUNTER — Encounter (HOSPITAL_COMMUNITY): Payer: Self-pay | Admitting: Physician Assistant

## 2015-12-08 DIAGNOSIS — R195 Other fecal abnormalities: Secondary | ICD-10-CM | POA: Insufficient documentation

## 2015-12-08 DIAGNOSIS — I340 Nonrheumatic mitral (valve) insufficiency: Secondary | ICD-10-CM

## 2015-12-08 DIAGNOSIS — K31819 Angiodysplasia of stomach and duodenum without bleeding: Secondary | ICD-10-CM | POA: Insufficient documentation

## 2015-12-08 DIAGNOSIS — D638 Anemia in other chronic diseases classified elsewhere: Secondary | ICD-10-CM

## 2015-12-08 DIAGNOSIS — D649 Anemia, unspecified: Secondary | ICD-10-CM

## 2015-12-08 DIAGNOSIS — R710 Precipitous drop in hematocrit: Secondary | ICD-10-CM

## 2015-12-08 LAB — HIV ANTIBODY (ROUTINE TESTING W REFLEX): HIV SCREEN 4TH GENERATION: NONREACTIVE

## 2015-12-08 LAB — RETICULOCYTES
RBC.: 2.64 MIL/uL — ABNORMAL LOW (ref 4.22–5.81)
RETIC COUNT ABSOLUTE: 44.9 10*3/uL (ref 19.0–186.0)
Retic Ct Pct: 1.7 % (ref 0.4–3.1)

## 2015-12-08 LAB — OCCULT BLOOD, POC DEVICE: FECAL OCCULT BLD: POSITIVE — AB

## 2015-12-08 LAB — BASIC METABOLIC PANEL
ANION GAP: 11 (ref 5–15)
BUN: 36 mg/dL — ABNORMAL HIGH (ref 6–20)
CO2: 22 mmol/L (ref 22–32)
Calcium: 8.4 mg/dL — ABNORMAL LOW (ref 8.9–10.3)
Chloride: 109 mmol/L (ref 101–111)
Creatinine, Ser: 1.72 mg/dL — ABNORMAL HIGH (ref 0.61–1.24)
GFR calc Af Amer: 43 mL/min — ABNORMAL LOW (ref >60)
GFR, EST NON AFRICAN AMERICAN: 37 mL/min — AB (ref >60)
Glucose, Bld: 119 mg/dL — ABNORMAL HIGH (ref 65–99)
POTASSIUM: 4.5 mmol/L (ref 3.5–5.1)
SODIUM: 142 mmol/L (ref 135–145)

## 2015-12-08 LAB — FOLATE: FOLATE: 27.8 ng/mL (ref >5.9)

## 2015-12-08 LAB — CBC
HCT: 26.3 % — ABNORMAL LOW (ref 39.0–52.0)
Hemoglobin: 8.5 g/dL — ABNORMAL LOW (ref 13.0–17.0)
MCH: 31.7 pg (ref 26.0–34.0)
MCHC: 32.3 g/dL (ref 30.0–36.0)
MCV: 98.1 fL (ref 78.0–100.0)
Platelets: 398 10*3/uL (ref 150–400)
RBC: 2.68 MIL/uL — ABNORMAL LOW (ref 4.22–5.81)
RDW: 13.9 % (ref 11.5–15.5)
WBC: 21.3 10*3/uL — ABNORMAL HIGH (ref 4.0–10.5)

## 2015-12-08 LAB — TROPONIN I: Troponin I: 0.53 ng/mL (ref <0.031)

## 2015-12-08 LAB — MAGNESIUM: Magnesium: 2.3 mg/dL (ref 1.7–2.4)

## 2015-12-08 LAB — PROTIME-INR
INR: 1.64 — AB (ref 0.00–1.49)
Prothrombin Time: 19.5 seconds — ABNORMAL HIGH (ref 11.6–15.2)

## 2015-12-08 MED ORDER — BOOST / RESOURCE BREEZE PO LIQD
1.0000 | Freq: Two times a day (BID) | ORAL | Status: DC
  Administered 2015-12-08 – 2015-12-10 (×3): 1 via ORAL

## 2015-12-08 MED ORDER — PROMETHAZINE HCL 25 MG/ML IJ SOLN
12.5000 mg | Freq: Four times a day (QID) | INTRAMUSCULAR | Status: DC | PRN
  Administered 2015-12-08 – 2015-12-10 (×3): 12.5 mg via INTRAVENOUS
  Filled 2015-12-08 (×3): qty 1

## 2015-12-08 NOTE — Progress Notes (Signed)
Patient with multiple arrhthymias overnight including V-tach up to 15 beats, Bigeminy, PVC's, and polymorphic V-tach. NP on call was notified and orders received for labs and EKG.  Will continue to monitor patient.

## 2015-12-08 NOTE — Progress Notes (Signed)
Initial Nutrition Assessment  DOCUMENTATION CODES:   Not applicable  INTERVENTION:  - Continue Ensure Enlive po BID, each supplement provides 350 kcal and 20 grams of protein (11 grams of fat/bottle) - Will order Boost Breeze BID, each supplement provides 250 kcal and 9 grams of protein (0 grams of fat/bottle) - Encourage intakes of meals, supplements, and fluids as able - RD will continue to monitor for additional needs  NUTRITION DIAGNOSIS:   Inadequate oral intake related to acute illness, chronic illness, nausea as evidenced by per patient/family report.  GOAL:   Patient will meet greater than or equal to 90% of their needs  MONITOR:   PO intake, Supplement acceptance, Weight trends, Labs, I & O's  REASON FOR ASSESSMENT:   Malnutrition Screening Tool  ASSESSMENT:   75 y.o. male, has a history of atrial fibrillation and heart disease ,gastroesophageal reflux disease hypertension hyperlipidemia , presents with complaints of cough and shortness of breath over last week, patient reports symptoms started with cough last week, fever, poor appetite, started to develop dyspnea, productive, with blood-tinged sputum, chest discomfort with cough, in ED workup was significant for leukocytosis of 30,000, acute on chronic renal failure, with creatinine of 2.8, afebrile in ED, hypoxic 86% on room air, chest x-ray significant for left upper lobe airspace disease, started on Rocephin and azithromycin, as well workup significant for elevated troponins, denies any chest pain, patient on warfarin for A. fib, with supratherapeutic INR at 4.4, and with anemia with hemoglobin of 9.5, baseline is 12. Denies any melena, hematochezia, or coffee-ground emesis.  Pt seen for MST. BMI indicates overweight status. No intakes documented since admission. Pt very drowsy during RD visit; he does provide some information and the remainder of information is provided by pt's family, who is at bedside. Family reports  that pt did not eat breakfast this AM but that he has been sipping on Ensure (pt has consumed ~50% of bottle of Ensure this AM). Pt denies abdominal discomfort or nausea at this time but states he was experiencing these sensations for the past 1-3 weeks PTA. Per pt and family report, pt's appetite has been decreasing the past 3 weeks and the past 3 days he has had very minimal to no PO intake. He was drinking Boost occasionally PTA to supplement poor intakes.   Family reports that pt recently saw Dr. Hilarie Fredrickson related to gallbladder issues and was provided with information pertaining to a low-fat diet at that appointment. Pt states that PTA he was having nausea and increased discomfort when consuming fatty foods and red meat. 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   Pt states UBW of 245 lbs and that he has been losing weight to reach weight of 180 lbs in the past 4 months. Chart review indicates current weight of 199 lbs and that pt's weight has been stable (196-202 lbs) over the past 8 months. Will continue to monitor weight trends during admission. Pt prefers physical assessment be done at follow-up as he is very drowsy this AM.   Likely not fully meeting needs PTA but unable to state any degree of malnutrition based on nutrition-related parameters at this time. Medications reviewed; 5000 units vitamin D/day, 325 mg ferrous sulfate/day, 1 multivitamin/day. Labs reviewed;  BUN/creatinine elevated, Ca: 8.4 mg/dL, GFR: 37.   Diet Order:  Diet Heart Room service appropriate?: Yes; Fluid consistency:: Thin  Skin:  Reviewed, no issues  Last BM:  3/4  Height:   Ht Readings from  Last 1 Encounters:  12/07/15 5\' 10"  (1.778 m)    Weight:   Wt Readings from Last 1 Encounters:  12/08/15 199 lb 11.8 oz (90.6 kg)    Ideal Body Weight:  75.45 kg (kg)  BMI:  Body mass index is 28.66 kg/(m^2).  Estimated Nutritional Needs:   Kcal:  1650-1850  Protein:  80-90  grams  Fluid:  >/= 2 L/day  EDUCATION NEEDS:   No education needs identified at this time     Jarome Matin, RD, LDN Inpatient Clinical Dietitian Pager # [phone] After hours/weekend pager # [phone]

## 2015-12-08 NOTE — Progress Notes (Signed)
Patient Demographics  [name] [name], is a 75 y.o. male, DOB - 01/15/1941, [id number]  Admit date - 12/07/2015   Admitting Physician Albertine Patricia, MD  Outpatient Primary MD for the patient is Claretta Fraise, MD  LOS - 1   Chief Complaint  Patient presents with  . Shortness of Breath       Admission HPI/Brief narrative: 75 y.o. male, has a history of atrial fibrillation and heart disease ,gastroesophageal reflux disease hypertension hyperlipidemia , presents with complaints of cough and shortness of breath , chest x-ray with evidence of pneumonia, as well workup significant for acute renal failure and elevated troponin , anemia with Hemoccult-positive stool with supratherapeutic INR, admitted for further workup.  Subjective:   [name] today has, No headache, No chest pain, still complains of cough, shortness of breath, generalized weakness, and poor appetite . Assessment & Plan    Active Problems:   Iron deficiency anemia   Essential hypertension   COPD (chronic obstructive pulmonary disease) with emphysema (HCC)   Chronic systolic CHF (congestive heart failure) (HCC)   Anticoagulant long-term use   Atrial fibrillation, chronic (HCC)   CAP (community acquired pneumonia)   Pneumathemia ([place])   Acute renal failure superimposed on stage 3 chronic kidney disease ([place])  Sepsis secondary to pneumonia - Patient presents with leukocytosis, tachypnea and tachycardia, secondary to community-acquired pneumonia - Pneumonia order set  on admission, -  will check sputum culture with gram-positive cocci, and rare gram-negative rods, blood culture no growth to date, strep pneumoniae antigen nonreactive,  - Continue with Rocephin and azithromycin - Cytosis trending down  COPD - No active wheezing, will continue with Pulmicort, will continue with DuoNeb every 6 hours and when necessary  albuterol  Acute on chronic hypoxic respiratory failure - Patient on 2 L nasal cannula at bedtime, currently hypoxic on room air daytime, this is most likely secondary to pneumonia - Wean oxygen as tolerated  Elevated troponin -  most likely in the setting of acute renal failure and demand ischemia from hypoxia and sepsis. - Troponins have plateaued 0.53>0.52>0.53 - We'll check 2-D echo, given patient had few arrhythmias and 2 beats of V. tach overnight.  Anemia/ GI bleed? - Patient with known history of anemia of iron deficiency, with significant hemoglobin drop, so will hold aspirin, patient is Hemoccult positive in ED(on po iron). - Agent with supratherapeutic INR, given IV vitamin K, hemoglobin trending down slowly as admission, but this is more likely delutional. - We'll change Protonix drip to IV twice a day - Gastric folds consulted  Chronic systolic CHF - Appears to be euvolemic, continue to monitor as on IV fluids.  A. Fib - Heart rate controlled, hold warfarin for GI bleed  Hypertension - Continue with home medication  Acute on chronic kidney disease stage III - Prerenal, continue with gentle hydration, monitor closely giving his history of CHF  Chronic anticoagulation with supratherapeutic INR - Received vitamin K for supratherapeutic INR, INR is 1.6 today  Code Status: Full  Family Communication: D/W patient  Disposition Plan: pending further work up.   Procedures  none   Consults   GI   Medications  Scheduled Meds: . amLODipine  10 mg Oral Daily  . azithromycin  500  mg Intravenous Q24H  . budesonide  0.25 mg Nebulization BID  . cefTRIAXone (ROCEPHIN)  IV  1 g Intravenous Q24H  . cholecalciferol  5,000 Units Oral Daily  . feeding supplement  1 Container Oral BID BM  . feeding supplement (ENSURE ENLIVE)  237 mL Oral BID BM  . ferrous sulfate  325 mg Oral Q breakfast  . fluticasone  1 spray Each Nare Daily  . ipratropium-albuterol  3 mL  Nebulization Q6H  . isosorbide-hydrALAZINE  1 tablet Oral BID  . levothyroxine  112 mcg Oral QAC breakfast  . multivitamin with minerals  1 tablet Oral Daily  . [START ON 12/11/2015] pantoprazole (PROTONIX) IV  40 mg Intravenous Q12H  . pravastatin  20 mg Oral q1800  . sodium chloride flush  3 mL Intravenous Q12H   Continuous Infusions: . sodium chloride 75 mL/hr at 12/08/15 0523  . pantoprozole (PROTONIX) infusion 8 mg/hr (12/08/15 0524)   PRN Meds:.acetaminophen **OR** acetaminophen, albuterol, docusate sodium, guaiFENesin, HYDROcodone-acetaminophen, nitroGLYCERIN, promethazine  DVT Prophylaxis   SCDs   Lab Results  Component Value Date   PLT 398 12/08/2015    Antibiotics   Anti-infectives    Start     Dose/Rate Route Frequency Ordered Stop   12/08/15 1430  azithromycin (ZITHROMAX) 500 mg in dextrose 5 % 250 mL IVPB     500 mg 250 mL/hr over 60 Minutes Intravenous Every 24 hours 12/07/15 1629 12/14/15 1429   12/08/15 1400  cefTRIAXone (ROCEPHIN) 1 g in dextrose 5 % 50 mL IVPB     1 g 100 mL/hr over 30 Minutes Intravenous Every 24 hours 12/07/15 1629 12/14/15 1359   12/07/15 1330  cefTRIAXone (ROCEPHIN) 1 g in dextrose 5 % 50 mL IVPB     1 g 100 mL/hr over 30 Minutes Intravenous  Once 12/07/15 1317 12/07/15 1415   12/07/15 1330  azithromycin (ZITHROMAX) 500 mg in dextrose 5 % 250 mL IVPB     500 mg 250 mL/hr over 60 Minutes Intravenous  Once 12/07/15 1317 12/07/15 1524          Objective:   Filed Vitals:   12/08/15 0511 12/08/15 0816 12/08/15 0917 12/08/15 1323  BP:   160/54 154/43  Pulse: 79     Temp:      TempSrc:      Resp: 20     Height:      Weight:      SpO2: 93% 92%      Wt Readings from Last 3 Encounters:  12/08/15 90.6 kg (199 lb 11.8 oz)  12/02/15 89.359 kg (197 lb)  10/30/15 91.082 kg (200 lb 12.8 oz)     Intake/Output Summary (Last 24 hours) at 12/08/15 1415 Last data filed at 12/08/15 0709  Gross per 24 hour  Intake   1575 ml  Output     500 ml  Net   1075 ml     Physical Exam  Awake Alert, Oriented X 3,  Supple Neck,No JVD,  Symmetrical Chest wall movement, Good air movement bilaterally, no wheezing Irregular,No Gallops,Rubs or new Murmurs, No Parasternal Heave +ve B.Sounds, Abd Soft, No tenderness, No organomegaly appriciated, No rebound - guarding or rigidity. No Cyanosis, Clubbing or edema, No new Rash or bruise     Data Review   Micro Results Recent Results (from the past 240 hour(s))  Culture, sputum-assessment     Status: None   Collection Time: 12/07/15  6:00 PM  Result Value Ref Range Status   Specimen Description SPUTUM  Final   Special Requests NONE  Final   Sputum evaluation   Final    THIS SPECIMEN IS ACCEPTABLE. RESPIRATORY CULTURE REPORT TO FOLLOW.   Report Status 12/07/2015 FINAL  Final  Culture, respiratory (NON-Expectorated)     Status: None (Preliminary result)   Collection Time: 12/07/15  6:00 PM  Result Value Ref Range Status   Specimen Description SPUTUM  Final   Special Requests NONE  Final   Gram Stain   Final    ABUNDANT WBC PRESENT, PREDOMINANTLY PMN FEW SQUAMOUS EPITHELIAL CELLS PRESENT MODERATE GRAM POSITIVE COCCI IN PAIRS RARE GRAM VARIABLE ROD Performed at [facility]    Culture   Final    Culture reincubated for better growth Performed at [facility]    Report Status PENDING  Incomplete    Radiology Reports Dg Chest 2 View  12/07/2015  CLINICAL DATA:  Cough. Weakness. Fever. Shortness of breath COPD. Asthma. Coronary artery disease. EXAM: CHEST  2 VIEW COMPARISON:  12/25/2014 FINDINGS: Prior median sternotomy. Midline trachea. Moderate cardiomegaly. Mild right hemidiaphragm elevation. No pleural effusion or pneumothorax. Diffuse peribronchial thickening. Left upper lobe patchy airspace disease. IMPRESSION: Left upper lobe airspace disease, most consistent with pneumonia. Followup PA and lateral chest X-ray is recommended in 3-4 weeks following trial of  antibiotic therapy to ensure resolution and exclude underlying malignancy. Underlying cardiomegaly with chronic interstitial thickening, likely related to COPD/ chronic bronchitis. Electronically Signed   By: Abigail Miyamoto M.D.   On: 12/07/2015 11:40     CBC  Recent Labs Lab 12/07/15 1056 12/07/15 2009 12/08/15 0156  WBC 30.4*  --  21.3*  HGB 9.5* 9.2* 8.5*  HCT 28.8* 29.0* 26.3*  PLT 418*  --  398  MCV 98.3  --  98.1  MCH 32.4  --  31.7  MCHC 33.0  --  32.3  RDW 13.7  --  13.9    Chemistries   Recent Labs Lab 12/07/15 1056 12/08/15 0155 12/08/15 0156  NA 141  --  142  K 4.6  --  4.5  CL 106  --  109  CO2 21*  --  22  GLUCOSE 117*  --  119*  BUN 35*  --  36*  CREATININE 1.85*  --  1.72*  CALCIUM 8.8*  --  8.4*  MG  --  2.3  --    ------------------------------------------------------------------------------------------------------------------ estimated creatinine clearance is 41.3 mL/min (by C-G formula based on Cr of 1.72). ------------------------------------------------------------------------------------------------------------------ No results for input(s): HGBA1C in the last 72 hours. ------------------------------------------------------------------------------------------------------------------ No results for input(s): CHOL, HDL, LDLCALC, TRIG, CHOLHDL, LDLDIRECT in the last 72 hours. ------------------------------------------------------------------------------------------------------------------ No results for input(s): TSH, T4TOTAL, T3FREE, THYROIDAB in the last 72 hours.  Invalid input(s): FREET3 ------------------------------------------------------------------------------------------------------------------ No results for input(s): VITAMINB12, FOLATE, FERRITIN, TIBC, IRON, RETICCTPCT in the last 72 hours.  Coagulation profile  Recent Labs Lab 12/04/15 0946 12/07/15 1052 12/08/15 0754  INR 1.7 4.48* 1.64*    No results for input(s): DDIMER in  the last 72 hours.  Cardiac Enzymes  Recent Labs Lab 12/07/15 1515 12/07/15 2009 12/08/15 0153  TROPONINI 0.53* 0.52* 0.53*   ------------------------------------------------------------------------------------------------------------------ Invalid input(s): POCBNP     Time Spent in minutes   35 minutes   Laurena Valko M.D on 12/08/2015 at 2:15 PM  Between 7am to 7pm - Pager - [phone]  After 7pm go to [email or web address] - password [facility]  [facility]   Office  [phone]

## 2015-12-08 NOTE — Progress Notes (Signed)
Echocardiogram 2D Echocardiogram has been performed.  Tresa Res 12/08/2015, 3:58 PM

## 2015-12-08 NOTE — Consult Note (Signed)
Referring Provider: [facility] Primary Care Physician:  Claretta Fraise, MD Primary Gastroenterologist:  Dr.Pyrtle  Reason for Consultation:   Anemia, heme positive stools      HPI: [name] [name] is a 75 y.o. male known to Dr. Hilarie Fredrickson with gallbladder disease with acute cholecystitis in March 2016 status post PTC placement with subsequent removal of PTC. Cholecystectomy was deferred due to medical comorbidities. 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 He has a history of atrial fibrillation on warfarin, gastroesophageal reflux disease, hyperlipidemia, and hypertension. He also has COPD for which he is on home oxygen 2 L during the day and 45 L at night per patient and his daughter. He presented to the emergency room yesterday with complaints of productive cough and shortness of breath of one week's duration. For a day or 2 prior to admission he was coughing up blood tinged sputum and was having chest discomfort. His wife was scheduled for a total knee replacement this morning and so his daughter brought him to the emergency room yesterday where he was noted to be hypoxic 86% on room air with a white blood cell count of 30,000 and a chest x-ray significant for left upper lobe airspace disease. He was started on azithromycin and Rocephin. INR was noted to be supratherapeutic at 4.4. His hemoglobin was noted to be 9.5 down from a baseline of 12. Patient denies any bright red blood per rectum. He states his stools are always dark because he's been on iron for several years. He has not had any coffee ground emesis. Colonoscopy 11/29/2011 did not reveal any polyps or cancers. Upper endoscopy that same day revealed probable gastric antral vascular ectasias, but was otherwise normal. Capsule endoscopy 06/12/2010 revealed complete study, fair prep with poor visualization beyond 1 hour, 30 minutes. No findings to explain anemia.  If clinically indicated repeat with more aggressive prep due to iron therapy.  Past Medical History  Diagnosis Date  . Unspecified essential hypertension   . Anemia   . Asthma   . Hepatomegaly   . Esophageal reflux   . Other and unspecified coagulation defects   . Hiatal hernia   . Personal history of colonic polyps 05/29/2010    TUBULAR ADENOMA  . CAD (coronary artery disease)   . Hypothyroidism   . Gout   . Hyperlipidemia   . Gastric antral vascular ectasia 2013  . Atrial fibrillation ([place])   . History of blood transfusion ~ 2012    "blood count dropped; had to get 3 units"  . Arthritis     "hips; back" (07/09/2014)  . COPD (chronic obstructive pulmonary disease) (H[facility])     on home oxygen, 2 liters at night10/2015  . Gallstones     Past Surgical History  Procedure Laterality Date  . Umbilical hernia repair    . Coronary angioplasty with stent placement  ~ 2008; 07/08/2014    "1 + 3"  . 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 Hernia repair    . Umbilical hernia repair  1970's  . Left and right heart catheterization with coronary/graft angiogram N/A 07/09/2014    Procedure: LEFT AND RIGHT HEART CATHETERIZATION WITH Beatrix Fetters;  Surgeon: Sinclair Grooms, MD;  Location: [facility] CATH [facility];  Service: Cardiovascular;  Laterality: N/A;  . Colonoscopy  2013    Dr. Sharlett Iles: no polyps or evidence of active bleeding  . Esophagogastroduodenoscopy  2013    Dr. 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 [place] capsule study  2013    Dr. Sharlett Iles: AVM at 5 minutes beyond first duodenal image. If persistent IDA, bleeding, recommend enteroscopy with ablation     Prior to Admission medications   Medication Sig Start Date End Date Taking? Authorizing Provider  acetaminophen (TYLENOL) 500 MG tablet Take 1,000 mg by mouth every 6 (six) hours as needed for mild pain. For pain.   Yes Historical Provider, MD  amLODipine  (NORVASC) 10 MG tablet Take 1 tablet (10 mg total) by mouth daily. 10/28/15  Yes Belva Crome, MD  aspirin EC 81 MG tablet Take 1 tablet (81 mg total) by mouth every Monday, Wednesday, and Friday. 10/08/14  Yes Belva Crome, MD  BIDIL 20-37.5 MG tablet Take 1 tablet by mouth 3  times daily 11/24/15  Yes Claretta Fraise, MD  budesonide (PULMICORT) 0.25 MG/2ML nebulizer solution Take 0.25 mg by nebulization 2 (two) times daily.    Yes Historical Provider, MD  Cholecalciferol (VITAMIN D3) 5000 UNITS CAPS Take 5,000 Units by mouth daily.    Yes Historical Provider, MD  docusate sodium (COLACE) 100 MG capsule Take 100 mg by mouth 2 (two) times daily as needed for mild constipation.   Yes Historical Provider, MD  ferrous sulfate 325 (65 FE) MG tablet Take 325 mg by mouth daily with breakfast.    Yes Historical Provider, MD  furosemide (LASIX) 40 MG tablet Take 1 tablet by mouth  daily 11/24/15  Yes Claretta Fraise, MD  guaiFENesin (MUCINEX) 600 MG 12 hr tablet Take 600 mg by mouth 2 (two) times daily as needed for cough or to loosen phlegm.   Yes Historical Provider, MD  HYDROcodone-acetaminophen (NORCO) 5-325 MG tablet Take 1 tablet by mouth every 6 (six) hours as needed for moderate pain. 11/02/15  Yes Claretta Fraise, MD  ipratropium-albuterol (DUONEB) 0.5-2.5 (3) MG/3ML SOLN Take 3 mLs by nebulization every 6 (six) hours as needed. For shortness of breath.    Yes Historical Provider, MD  levothyroxine (SYNTHROID, LEVOTHROID) 112 MCG tablet TAKE 1 TABLET (112 MCG TOTAL) BY MOUTH DAILY BEFORE BREAKFAST. 04/25/15  Yes Claretta Fraise, MD  lovastatin (MEVACOR) 40 MG tablet Take 1 tablet by mouth at  bedtime Patient taking differently: Take 1 tablet by mouth in the morning 12/04/15  Yes Claretta Fraise, MD  Multiple Vitamins-Minerals (MULTIVITAMINS THER. W/MINERALS) TABS Take 1 tablet by mouth daily.     Yes Historical Provider, MD  nitroGLYCERIN (NITROSTAT) 0.4 MG SL tablet Place 0.4 mg under the tongue every 5 (five)  minutes as needed for chest pain.   Yes Historical Provider, MD  polyethylene glycol powder (GLYCOLAX/MIRALAX) powder Take 1 capful (17 grams) dissolved in at least 8 ounces water/juice once daily. 12/02/15  Yes Jerene Bears, MD  potassium chloride (K-DUR) 10 MEQ tablet Take 1 tablet (10 mEq total) by mouth daily. 05/30/15  Yes Belva Crome, MD  PROAIR HFA 108 [phone] Base) MCG/ACT inhaler Use 2 puffs every 6 hours  as needed for shortness of  breath 12/04/15  Yes Claretta Fraise, MD  warfarin (COUMADIN) 5 MG tablet Take 1 tablet by mouth  every day or as directed by anticoagulation clinic Patient taking differently: Take 1 tablet by mouth Mon, Tues, Wed, Fri, and Sat then takes 0.5 tablet on Thurs and Sun. 11/05/15  Yes Claretta Fraise, MD    Current Facility-Administered Medications  Medication Dose Route Frequency Provider  Last Rate Last Dose  . 0.9 %  sodium chloride infusion   Intravenous Continuous Albertine Patricia, MD 75 mL/hr at 12/08/15 0523    . acetaminophen (TYLENOL) tablet 650 mg  650 mg Oral Q6H PRN Albertine Patricia, MD       Or  . acetaminophen (TYLENOL) suppository 650 mg  650 mg Rectal Q6H PRN Silver Huguenin Elgergawy, MD      . albuterol (PROVENTIL) (2.5 MG/3ML) 0.083% nebulizer solution 2.5 mg  2.5 mg Nebulization Q2H PRN Albertine Patricia, MD   2.5 mg at 12/08/15 0510  . amLODipine (NORVASC) tablet 10 mg  10 mg Oral Daily Albertine Patricia, MD   10 mg at 12/08/15 0917  . azithromycin (ZITHROMAX) 500 mg in dextrose 5 % 250 mL IVPB  500 mg Intravenous Q24H Silver Huguenin Elgergawy, MD   500 mg at 12/08/15 1400  . budesonide (PULMICORT) nebulizer solution 0.25 mg  0.25 mg Nebulization BID Albertine Patricia, MD   0.25 mg at 12/08/15 0821  . cefTRIAXone (ROCEPHIN) 1 g in dextrose 5 % 50 mL IVPB  1 g Intravenous Q24H Albertine Patricia, MD   1 g at 12/08/15 1321  . cholecalciferol (VITAMIN D) tablet 5,000 Units  5,000 Units Oral Daily Albertine Patricia, MD   5,000 Units at 12/08/15 [phone]  . docusate  sodium (COLACE) capsule 100 mg  100 mg Oral BID PRN Albertine Patricia, MD      . feeding supplement (BOOST / RESOURCE BREEZE) liquid 1 Container  1 Container Oral BID BM Albertine Patricia, MD   1 Container at 12/08/15 1400  . feeding supplement (ENSURE ENLIVE) (ENSURE ENLIVE) liquid 237 mL  237 mL Oral BID BM Silver Huguenin Elgergawy, MD   237 mL at 12/08/15 1000  . ferrous sulfate tablet 325 mg  325 mg Oral Q breakfast Albertine Patricia, MD   325 mg at 12/08/15 0800  . fluticasone (FLONASE) 50 MCG/ACT nasal spray 1 spray  1 spray Each Nare Daily Hewitt Shorts Harduk, PA-C   1 spray at 12/08/15 0924  . guaiFENesin (MUCINEX) 12 hr tablet 600 mg  600 mg Oral BID PRN Albertine Patricia, MD      . HYDROcodone-acetaminophen (NORCO/VICODIN) 5-325 MG per tablet 1-2 tablet  1-2 tablet Oral Q4H PRN Silver Huguenin Elgergawy, MD      . ipratropium-albuterol (DUONEB) 0.5-2.5 (3) MG/3ML nebulizer solution 3 mL  3 mL Nebulization Q6H Silver Huguenin Elgergawy, MD   3 mL at 12/08/15 1424  . isosorbide-hydrALAZINE (BIDIL) 20-37.5 MG per tablet 1 tablet  1 tablet Oral BID Albertine Patricia, MD   1 tablet at 12/08/15 0917  . levothyroxine (SYNTHROID, LEVOTHROID) tablet 112 mcg  112 mcg Oral QAC breakfast Albertine Patricia, MD   112 mcg at 12/08/15 0800  . multivitamin with minerals tablet 1 tablet  1 tablet Oral Daily Albertine Patricia, MD   1 tablet at 12/08/15 0917  . nitroGLYCERIN (NITROSTAT) SL tablet 0.4 mg  0.4 mg Sublingual Q5 min PRN Albertine Patricia, MD      . pantoprazole (PROTONIX) 80 mg in sodium chloride 0.9 % 250 mL (0.32 mg/mL) infusion  8 mg/hr Intravenous Continuous Albertine Patricia, MD 25 mL/hr at 12/08/15 0524 8 mg/hr at 12/08/15 0524  . [START ON 12/11/2015] pantoprazole (PROTONIX) injection 40 mg  40 mg Intravenous Q12H Dawood S Elgergawy, MD      . pravastatin (PRAVACHOL) tablet 20 mg  20 mg Oral  [id number] Albertine Patricia, MD      . promethazine (PHENERGAN) injection 12.5 mg  12.5 mg Intravenous Q6H PRN Hewitt Shorts  Harduk, PA-C   12.5 mg at 12/08/15 1359  . sodium chloride flush (NS) 0.9 % injection 3 mL  3 mL Intravenous Q12H Albertine Patricia, MD   3 mL at 12/07/15 2200    Allergies as of 12/07/2015  . (No Known Allergies)    Family History  Problem Relation Age of Onset  . Leukemia Mother   . Kidney disease Mother     kidney removed   . Colon cancer Neg Hx   . Heart attack Brother   . Hypertension      family   . Cancer Father   . Heart disease Father   . Stomach cancer Sister   . Stomach cancer Sister   . Early death Brother     Social History   Social History  . Marital Status: Married    Spouse Name: N/A  . Number of Children: N/A  . Years of Education: N/A   Occupational History  . retired    Social History Main Topics  . Smoking status: Former Smoker -- 1.00 packs/day for 42 years    Types: Cigarettes    Quit date: 12/16/1996  . Smokeless tobacco: Never Used  . Alcohol Use: 0.6 oz/week    1 Cans of beer per week     Comment: 07/09/2014 "averages < 1 beer/wk", 11/26/14: denies ETOH  . Drug Use: No  . Sexual Activity: No   Other Topics Concern  . Not on file   Social History Narrative    Review of Systems: Gen: Denies any fever, chills, sweats, anorexia. Admits to fatigue and weakness  CV: Denies chest pain, angina, palpitations, syncope, orthopnea, PND, peripheral edema, and claudication. Resp: Admits to productive cough with blood tinged sputum  GI: Denies vomiting blood, jaundice, and fecal incontinence.   Denies dysphagia or odynophagia. GU : Denies urinary burning, blood in urine, urinary frequency, urinary hesitancy, nocturnal urination, and urinary incontinence. MS: Denies joint pain, limitation of movement, and swelling, stiffness, low back pain, extremity pain. Denies muscle weakness, cramps, atrophy.  Derm: Denies rash, itching, dry skin, hives, moles, warts, or unhealing ulcers.  Psych: Denies depression, anxiety, memory loss, suicidal ideation,  hallucinations, paranoia, and confusion. Heme: Denies bruising, bleeding, and enlarged lymph nodes. Neuro:  Denies any headaches, dizziness, paresthesias. Endo:  Denies any problems with DM, thyroid, adrenal function.  Physical Exam: Vital signs in last 24 hours: Temp:  [98.1 F (36.7 C)-98.3 F (36.8 C)] 98.2 F (36.8 C) (03/06 0507) Pulse Rate:  [61-93] 79 (03/06 0511) Resp:  [17-22] 20 (03/06 0511) BP: (132-160)/(43-83) 154/43 mmHg (03/06 1323) SpO2:  [90 %-94 %] 92 % (03/06 1425) Weight:  [193 lb 6.4 oz (87.726 kg)-199 lb 11.8 oz (90.6 kg)] 199 lb 11.8 oz (90.6 kg) (03/06 0507) Last BM Date: 12/06/15 General:   Alert, chronically ill-appearing male with nasal cannula in place.  Head:  Normocephalic and atraumatic. Eyes:  Sclera clear, no icterus.   Conjunctiva pink. Ears:  Normal auditory acuity. Nose:  No deformity, discharge,  or lesions. Mouth:  No deformity or lesions.   Neck:  Supple; no masses or thyromegaly. Lungs:  Clear throughout to auscultation.   No wheezes.   Heart:  Irregularly irregular  Abdomen:  Soft,nontender, BS active,nonpalp mass or hsm.   Msk:  Symmetrical without gross deformities. . Pulses:  Normal pulses noted. Extremities: Without  clubbing or edema. Neurologic  Alert and  oriented x4;  grossly normal neurologically. Skin:  Intact without significant lesions or rashes.. Psych:  Alert and cooperative. Normal mood and affect.  Intake/Output from previous day: 03/05 0701 - 03/06 0700 In: 1575 [I.V.:1425; IV Piggyback:150] Out: 350 [Urine:350] Intake/Output this shift: Total I/O In: -  Out: 150 [Urine:150]  Lab Results:  Recent Labs  12/07/15 1056 12/07/15 2009 12/08/15 0156  WBC 30.4*  --  21.3*  HGB 9.5* 9.2* 8.5*  HCT 28.8* 29.0* 26.3*  PLT 418*  --  398   BMET  Recent Labs  12/07/15 1056 12/08/15 0156  NA 141 142  K 4.6 4.5  CL 106 109  CO2 21* 22  GLUCOSE 117* 119*  BUN 35* 36*  CREATININE 1.85* 1.72*  CALCIUM 8.8* 8.4*     PT/INR  Recent Labs  12/07/15 1052 12/08/15 0754  LABPROT 41.4* 19.5*  INR 4.48* 1.64*     Studies/Results: Dg Chest 2 View  12/07/2015  CLINICAL DATA:  Cough. Weakness. Fever. Shortness of breath COPD. Asthma. Coronary artery disease. EXAM: CHEST  2 VIEW COMPARISON:  12/25/2014 FINDINGS: Prior median sternotomy. Midline trachea. Moderate cardiomegaly. Mild right hemidiaphragm elevation. No pleural effusion or pneumothorax. Diffuse peribronchial thickening. Left upper lobe patchy airspace disease. IMPRESSION: Left upper lobe airspace disease, most consistent with pneumonia. Followup PA and lateral chest X-ray is recommended in 3-4 weeks following trial of antibiotic therapy to ensure resolution and exclude underlying malignancy. Underlying cardiomegaly with chronic interstitial thickening, likely related to COPD/ chronic bronchitis. Electronically Signed   By: Abigail Miyamoto M.D.   On: 12/07/2015 11:40    IMPRESSION/PLAN:  75 year old male with a history of atrial fibrillation on warfarin, amongst a history of GAVE, GERD, hyperlipidemia, and hypertension, admitted with shortness of breath and cough. Chest x-ray revealed pneumonia. Troponins were mildly elevated--had echo earlier today, results pending. Patient noted to have a drop of hemoglobin since admission, may be dilutional. Patient does have a long history of iron deficiency anemia and is currently Hemoccult positive. Question blues from GAVE with supratherapeutic INR. Would continue to hold anticoagulants. Continue PPI. Will review with attending as to EGD later this week when respiratory status improves. Currently on Rocephin and azithromycin.  Chronic systolic CHF Acute on chronic hypoxic respiratory failure Sepsis secondary to pneumonia COPD Atrial fibrillation on anticoagulant long-term. AKI    Hvozdovic, Vita Barley PA-C 12/08/2015,  Pager [phone]  Mon-Fri 8a-5p [phone] after 5p, weekends, holidays    [facility] Attending     I have taken an interval history, reviewed the chart and examined the patient. I agree with the Advanced Practitioner's note, impression and recommendations.    Review of prior endoscopy photos also - does look like he could have gastric antral vascular ectasia (GAVE). His resp status is not good enough for sedation and EGD. Will f/u until it is. Doubt + troponin will preclude EGD INR down sig after vit K   Gatha Mayer, MD, [facility] [phone] (pager) [phone] after 5 PM, weekends and holidays  12/08/2015 4:27 PM

## 2015-12-08 NOTE — Progress Notes (Signed)
Patient had another 12runs of V-tach X2, Dr. Oda Cogan notified, no new order given will continue to monitor patient.

## 2015-12-09 DIAGNOSIS — R778 Other specified abnormalities of plasma proteins: Secondary | ICD-10-CM

## 2015-12-09 DIAGNOSIS — J189 Pneumonia, unspecified organism: Secondary | ICD-10-CM

## 2015-12-09 DIAGNOSIS — N179 Acute kidney failure, unspecified: Secondary | ICD-10-CM

## 2015-12-09 DIAGNOSIS — Z7901 Long term (current) use of anticoagulants: Secondary | ICD-10-CM

## 2015-12-09 DIAGNOSIS — R7989 Other specified abnormal findings of blood chemistry: Secondary | ICD-10-CM

## 2015-12-09 DIAGNOSIS — D649 Anemia, unspecified: Secondary | ICD-10-CM

## 2015-12-09 DIAGNOSIS — I5022 Chronic systolic (congestive) heart failure: Secondary | ICD-10-CM

## 2015-12-09 DIAGNOSIS — I482 Chronic atrial fibrillation: Secondary | ICD-10-CM

## 2015-12-09 DIAGNOSIS — N183 Chronic kidney disease, stage 3 (moderate): Secondary | ICD-10-CM

## 2015-12-09 LAB — BASIC METABOLIC PANEL
Anion gap: 11 (ref 5–15)
BUN: 35 mg/dL — ABNORMAL HIGH (ref 6–20)
CALCIUM: 8.4 mg/dL — AB (ref 8.9–10.3)
CO2: 22 mmol/L (ref 22–32)
CREATININE: 1.63 mg/dL — AB (ref 0.61–1.24)
Chloride: 110 mmol/L (ref 101–111)
GFR, EST AFRICAN AMERICAN: 46 mL/min — AB (ref >60)
GFR, EST NON AFRICAN AMERICAN: 39 mL/min — AB (ref >60)
Glucose, Bld: 111 mg/dL — ABNORMAL HIGH (ref 65–99)
Potassium: 4.1 mmol/L (ref 3.5–5.1)
SODIUM: 143 mmol/L (ref 135–145)

## 2015-12-09 LAB — PROTIME-INR
INR: 1.46 (ref 0.00–1.49)
Prothrombin Time: 17.8 seconds — ABNORMAL HIGH (ref 11.6–15.2)

## 2015-12-09 LAB — IRON AND TIBC
IRON: 12 ug/dL — AB (ref 45–182)
SATURATION RATIOS: 4 % — AB (ref 17.9–39.5)
TIBC: 293 ug/dL (ref 250–450)
UIBC: 281 ug/dL

## 2015-12-09 LAB — CBC
HEMATOCRIT: 25.7 % — AB (ref 39.0–52.0)
Hemoglobin: 8.2 g/dL — ABNORMAL LOW (ref 13.0–17.0)
MCH: 31.2 pg (ref 26.0–34.0)
MCHC: 31.9 g/dL (ref 30.0–36.0)
MCV: 97.7 fL (ref 78.0–100.0)
PLATELETS: 425 10*3/uL — AB (ref 150–400)
RBC: 2.63 MIL/uL — ABNORMAL LOW (ref 4.22–5.81)
RDW: 14.1 % (ref 11.5–15.5)
WBC: 14.1 10*3/uL — ABNORMAL HIGH (ref 4.0–10.5)

## 2015-12-09 LAB — VITAMIN B12: VITAMIN B 12: 1233 pg/mL — AB (ref 180–914)

## 2015-12-09 LAB — FERRITIN: FERRITIN: 78 ng/mL (ref 24–336)

## 2015-12-09 MED ORDER — GI COCKTAIL ~~LOC~~
30.0000 mL | Freq: Once | ORAL | Status: AC
  Administered 2015-12-09: 30 mL via ORAL
  Filled 2015-12-09: qty 30

## 2015-12-09 MED ORDER — FUROSEMIDE 40 MG PO TABS
40.0000 mg | ORAL_TABLET | Freq: Every day | ORAL | Status: DC
  Filled 2015-12-09: qty 1

## 2015-12-09 MED ORDER — IPRATROPIUM-ALBUTEROL 0.5-2.5 (3) MG/3ML IN SOLN
3.0000 mL | RESPIRATORY_TRACT | Status: DC
  Administered 2015-12-09 – 2015-12-10 (×10): 3 mL via RESPIRATORY_TRACT
  Filled 2015-12-09 (×10): qty 3

## 2015-12-09 MED ORDER — ALPRAZOLAM 0.25 MG PO TABS
0.2500 mg | ORAL_TABLET | Freq: Two times a day (BID) | ORAL | Status: DC | PRN
  Administered 2015-12-09 – 2015-12-12 (×4): 0.25 mg via ORAL
  Filled 2015-12-09 (×4): qty 1

## 2015-12-09 MED ORDER — METHYLPREDNISOLONE SODIUM SUCC 125 MG IJ SOLR
60.0000 mg | Freq: Two times a day (BID) | INTRAMUSCULAR | Status: DC
  Administered 2015-12-09 – 2015-12-10 (×2): 60 mg via INTRAVENOUS
  Filled 2015-12-09 (×2): qty 2

## 2015-12-09 NOTE — Progress Notes (Signed)
   Patient Name: [name] [name] Date of Encounter: 12/09/2015, 10:20 AM    Subjective  Felt better earlier but now weak again. No visible bleeding   Objective  BP 155/73 mmHg  Pulse 91  Temp(Src) 98.5 F (36.9 C) (Oral)  Resp 24  Ht 5\' 10"  (1.778 m)  Wt 198 lb 4.8 oz (89.948 kg)  BMI 28.45 kg/m2  SpO2 93% Chron ill Lungs are wheezy Heart distant abd protuberant  CBC Latest Ref Rng 12/09/2015 12/08/2015 12/07/2015  WBC 4.0 - 10.5 K/uL 14.1(H) 21.3(H) -  Hemoglobin 13.0 - 17.0 g/dL 8.2(L) 8.5(L) 9.2(L)  Hematocrit 39.0 - 52.0 % 25.7(L) 26.3(L) 29.0(L)  Platelets 150 - 400 K/uL 425(H) 398 -   Lab Results  Component Value Date   INR 1.46 12/09/2015   INR 1.64* 12/08/2015   INR 4.48* 12/07/2015   Lab Results  Component Value Date   FERRITIN 78 12/09/2015   Lab Results  Component Value Date   VITAMINB12 1233* 12/09/2015       Assessment and Plan  Anemia, decreased Hgb heme + Hx gastric antral vasclar dysplasia Pneumonia + troponin Has had NS Vtach  Resp status stable to sl better Echo pending Not ready for EGD yet  Gatha Mayer, MD, [facility] [phone] (pager) [phone] after 5 PM, weekends and holidays  12/09/2015 10:23 AM      Gatha Mayer, MD, [facility] [phone] (pager) [phone] after 5 PM, weekends and holidays  12/09/2015 10:20 AM

## 2015-12-09 NOTE — Progress Notes (Signed)
Patient Demographics  [name] [name], is a 75 y.o. male, DOB - [birth year], [id number]  Admit date - 12/07/2015   Admitting Physician Albertine Patricia, MD  Outpatient Primary MD for the patient is Claretta Fraise, MD  LOS - 2   Chief Complaint  Patient presents with  . Shortness of Breath       Admission HPI/Brief narrative: 75 y.o. male, has a history of atrial fibrillation and heart disease ,gastroesophageal reflux disease hypertension hyperlipidemia , presents with complaints of cough and shortness of breath , chest x-ray with evidence of pneumonia, as well workup significant for acute renal failure and elevated troponin , anemia with Hemoccult-positive stool with supratherapeutic INR, admitted for further workup, 2-D echo showing significant drop in ejection fraction, it is 25%, 5% on echo 10/2058.  Subjective:   [name] [name] today has, No headache, No chest pain, still complains of cough, shortness of breath, generalized weakness, and poor appetite . Assessment & Plan    Active Problems:   Iron deficiency anemia   Essential hypertension   COPD (chronic obstructive pulmonary disease) with emphysema (HCC)   Chronic systolic CHF (congestive heart failure) (HCC)   Anticoagulant long-term use   Atrial fibrillation, chronic (HCC)   CAP (community acquired pneumonia)   Pneumathemia ([place])   Acute renal failure superimposed on stage 3 chronic kidney disease (HCC)   Heme + stool   Decreased hemoglobin   GAVE (gastric antral vascular ectasia)   Elevated troponin   Absolute anemia  Sepsis secondary to pneumonia - Patient presents with leukocytosis, tachypnea and tachycardia, secondary to community-acquired pneumonia - Pneumonia order set  on admission, -   sputum culture with gram-positive cocci, and rare gram-negative rods, -  blood culture no growth to date,  - strep pneumoniae antigen  nonreactive,  - Continue with Rocephin and azithromycin - Patient to be improving, leucytosis trending down  COPD - Patient with no significant wheezing on admission, but he has significant wheezing this a.m., so he'll be started on IV steroids. -  continue with Pulmicort, will continue with DuoNeb every 6 hours and when necessary albuterol  Acute on chronic hypoxic respiratory failure - Patient on 2 L nasal cannula at bedtime, currently hypoxic on room air daytime, this is most likely secondary to pneumonia and COPD exacerbation - Wean oxygen as tolerated  Elevated troponin -  most likely in the setting of acute renal failure and demand ischemia from hypoxia and sepsis. - Troponins have plateaued 0.53>0.52>0.53 - Was consulted given significant drop in ejection fraction, and few  episodes of NSVT  Anemia/ GI bleed? - Patient with known history of anemia of iron deficiency, with significant hemoglobin drop, so will hold aspirin, patient is Hemoccult positive in ED(on po iron). -  with supratherapeutic INR, given IV vitamin K, hemoglobin trending down slowly as admission, but this is more likely delutional. -  Continue with  IV Protonix - GI consult appreciated, patient with history of gastric antral vascular dysplasia, the plan is for EGD when  more stable.  Chronic systolic CHF - repeat echo this admission  Showing EF 25%, patient appears to be euvolemic, will discontinue IV fluids, will resume his home dose Lasix in the a.m. as per cardiology recommendation  A. Fib -  Heart rate controlled, hold warfarin for GI bleed  Hypertension - Continue with home medication  Acute on chronic kidney disease stage III - Prerenal, moving with IV fluids , be resumed back on his home dose Lasix to avoid volume overload in the presence of systolic CHF  Chronic anticoagulation with supratherapeutic INR - Received vitamin K for supratherapeutic INR, INR is 1.4 today  Code Status: Full  Family  Communication: D/W patient  Disposition Plan: pending further work up.   Procedures  none   Consults   GI cardiology  Medications  Scheduled Meds: . amLODipine  10 mg Oral Daily  . azithromycin  500 mg Intravenous Q24H  . budesonide  0.25 mg Nebulization BID  . cefTRIAXone (ROCEPHIN)  IV  1 g Intravenous Q24H  . cholecalciferol  5,000 Units Oral Daily  . feeding supplement  1 Container Oral BID BM  . feeding supplement (ENSURE ENLIVE)  237 mL Oral BID BM  . ferrous sulfate  325 mg Oral Q breakfast  . fluticasone  1 spray Each Nare Daily  . [START ON 12/10/2015] furosemide  40 mg Oral Daily  . ipratropium-albuterol  3 mL Nebulization Q4H  . isosorbide-hydrALAZINE  1 tablet Oral BID  . levothyroxine  112 mcg Oral QAC breakfast  . methylPREDNISolone (SOLU-MEDROL) injection  60 mg Intravenous Q12H  . multivitamin with minerals  1 tablet Oral Daily  . [START ON 12/11/2015] pantoprazole (PROTONIX) IV  40 mg Intravenous Q12H  . pravastatin  20 mg Oral q1800  . sodium chloride flush  3 mL Intravenous Q12H   Continuous Infusions: . pantoprozole (PROTONIX) infusion 8 mg/hr (12/09/15 0512)   PRN Meds:.acetaminophen **OR** acetaminophen, albuterol, ALPRAZolam, docusate sodium, guaiFENesin, HYDROcodone-acetaminophen, nitroGLYCERIN, promethazine  DVT Prophylaxis   SCDs , holding chemical anticoagulation for GI bleed  Lab Results  Component Value Date   PLT 425* 12/09/2015    Antibiotics   Anti-infectives    Start     Dose/Rate Route Frequency Ordered Stop   12/08/15 1430  azithromycin (ZITHROMAX) 500 mg in dextrose 5 % 250 mL IVPB     500 mg 250 mL/hr over 60 Minutes Intravenous Every 24 hours 12/07/15 1629 12/14/15 1429   12/08/15 1400  cefTRIAXone (ROCEPHIN) 1 g in dextrose 5 % 50 mL IVPB     1 g 100 mL/hr over 30 Minutes Intravenous Every 24 hours 12/07/15 1629 12/14/15 1359   12/07/15 1330  cefTRIAXone (ROCEPHIN) 1 g in dextrose 5 % 50 mL IVPB     1 g 100 mL/hr over 30  Minutes Intravenous  Once 12/07/15 1317 12/07/15 1415   12/07/15 1330  azithromycin (ZITHROMAX) 500 mg in dextrose 5 % 250 mL IVPB     500 mg 250 mL/hr over 60 Minutes Intravenous  Once 12/07/15 1317 12/07/15 1524          Objective:   Filed Vitals:   12/09/15 0924 12/09/15 0926 12/09/15 1130 12/09/15 1400  BP: 155/73   131/48  Pulse:    60  Temp:    98.6 F (37 C)  TempSrc:    Oral  Resp:    20  Height:      Weight:      SpO2:  93% 95% 94%    Wt Readings from Last 3 Encounters:  12/09/15 89.948 kg (198 lb 4.8 oz)  12/02/15 89.359 kg (197 lb)  10/30/15 91.082 kg (200 lb 12.8 oz)     Intake/Output Summary (Last 24 hours) at 12/09/15 1418 Last data filed  at 12/09/15 1142  Gross per 24 hour  Intake   2890 ml  Output    825 ml  Net   2065 ml     Physical Exam  Awake Alert, Oriented X 3,  Supple Neck,No JVD,  Symmetrical Chest wall movement, Good air movement bilaterally, no wheezing Irregular,No Gallops,Rubs or new Murmurs, No Parasternal Heave +ve B.Sounds, Abd Soft, No tenderness, No organomegaly appriciated, No rebound - guarding or rigidity. No Cyanosis, Clubbing or edema, No new Rash or bruise     Data Review   Micro Results Recent Results (from the past 240 hour(s))  Culture, blood (routine x 2) Call MD if unable to obtain prior to antibiotics being given     Status: None (Preliminary result)   Collection Time: 12/07/15  5:00 PM  Result Value Ref Range Status   Specimen Description BLOOD  Final   Special Requests NONE  Final   Culture   Final    NO GROWTH 2 DAYS Performed at [facility]    Report Status PENDING  Incomplete  Culture, blood (routine x 2) Call MD if unable to obtain prior to antibiotics being given     Status: None (Preliminary result)   Collection Time: 12/07/15  5:09 PM  Result Value Ref Range Status   Specimen Description BLOOD  Final   Special Requests NONE  Final   Culture   Final    NO GROWTH 2 DAYS Performed at  [facility]    Report Status PENDING  Incomplete  Culture, sputum-assessment     Status: None   Collection Time: 12/07/15  6:00 PM  Result Value Ref Range Status   Specimen Description SPUTUM  Final   Special Requests NONE  Final   Sputum evaluation   Final    THIS SPECIMEN IS ACCEPTABLE. RESPIRATORY CULTURE REPORT TO FOLLOW.   Report Status 12/07/2015 FINAL  Final  Culture, respiratory (NON-Expectorated)     Status: None (Preliminary result)   Collection Time: 12/07/15  6:00 PM  Result Value Ref Range Status   Specimen Description SPUTUM  Final   Special Requests NONE  Final   Gram Stain   Final    ABUNDANT WBC PRESENT, PREDOMINANTLY PMN FEW SQUAMOUS EPITHELIAL CELLS PRESENT MODERATE GRAM POSITIVE COCCI IN PAIRS RARE GRAM VARIABLE ROD Performed at [facility]    Culture   Final    MODERATE GRAM NEGATIVE RODS Culture reincubated for better growth Performed at [facility]    Report Status PENDING  Incomplete    Radiology Reports Dg Chest 2 View  12/07/2015  CLINICAL DATA:  Cough. Weakness. Fever. Shortness of breath COPD. Asthma. Coronary artery disease. EXAM: CHEST  2 VIEW COMPARISON:  12/25/2014 FINDINGS: Prior median sternotomy. Midline trachea. Moderate cardiomegaly. Mild right hemidiaphragm elevation. No pleural effusion or pneumothorax. Diffuse peribronchial thickening. Left upper lobe patchy airspace disease. IMPRESSION: Left upper lobe airspace disease, most consistent with pneumonia. Followup PA and lateral chest X-ray is recommended in 3-4 weeks following trial of antibiotic therapy to ensure resolution and exclude underlying malignancy. Underlying cardiomegaly with chronic interstitial thickening, likely related to COPD/ chronic bronchitis. Electronically Signed   By: Abigail Miyamoto M.D.   On: 12/07/2015 11:40     CBC  Recent Labs Lab 12/07/15 1056 12/07/15 2009 12/08/15 0156 12/09/15 0419  WBC 30.4*  --  21.3* 14.1*  HGB 9.5* 9.2* 8.5*  8.2*  HCT 28.8* 29.0* 26.3* 25.7*  PLT 418*  --  398 425*  MCV 98.3  --  98.1 97.7  MCH 32.4  --  31.7 31.2  MCHC 33.0  --  32.3 31.9  RDW 13.7  --  13.9 14.1    Chemistries   Recent Labs Lab 12/07/15 1056 12/08/15 0155 12/08/15 0156 12/09/15 0419  NA 141  --  142 143  K 4.6  --  4.5 4.1  CL 106  --  109 110  CO2 21*  --  22 22  GLUCOSE 117*  --  119* 111*  BUN 35*  --  36* 35*  CREATININE 1.85*  --  1.72* 1.63*  CALCIUM 8.8*  --  8.4* 8.4*  MG  --  2.3  --   --    ------------------------------------------------------------------------------------------------------------------ estimated creatinine clearance is 43.5 mL/min (by C-G formula based on Cr of 1.63). ------------------------------------------------------------------------------------------------------------------ No results for input(s): HGBA1C in the last 72 hours. ------------------------------------------------------------------------------------------------------------------ No results for input(s): CHOL, HDL, LDLCALC, TRIG, CHOLHDL, LDLDIRECT in the last 72 hours. ------------------------------------------------------------------------------------------------------------------ No results for input(s): TSH, T4TOTAL, T3FREE, THYROIDAB in the last 72 hours.  Invalid input(s): FREET3 ------------------------------------------------------------------------------------------------------------------  Recent Labs  12/08/15 1418 12/09/15 0419  VITAMINB12  --  1233*  FOLATE 27.8  --   FERRITIN  --  78  TIBC  --  293  IRON  --  12*  RETICCTPCT 1.7  --     Coagulation profile  Recent Labs Lab 12/04/15 0946 12/07/15 1052 12/08/15 0754 12/09/15 0419  INR 1.7 4.48* 1.64* 1.46    No results for input(s): DDIMER in the last 72 hours.  Cardiac Enzymes  Recent Labs Lab 12/07/15 1515 12/07/15 2009 12/08/15 0153  TROPONINI 0.53* 0.52* 0.53*    ------------------------------------------------------------------------------------------------------------------ Invalid input(s): POCBNP     Time Spent in minutes   35 minutes   Zaryan Yakubov M.D on 12/09/2015 at 2:18 PM  Between 7am to 7pm - Pager - [phone]  After 7pm go to [email or web address] - password [facility]  [facility]   Office  [phone]

## 2015-12-09 NOTE — Patient Outreach (Addendum)
[facility] [place] [facility]) Care Management  12/09/2015  [name] [name] [birth year] [id number]   Patient listed on [place] high risk list, patient is currently inpatient.  Requested [place] [facility] hospital liaisons engage for services.  Consult received for EMMI Heart Failure transition calls.    Thanks, Ronnell Freshwater. [place], [place] Assistant Phone: [phone] Fax: [phone]

## 2015-12-09 NOTE — Consult Note (Addendum)
CARDIOLOGY CONSULT NOTE       Patient ID: JONAHTAN HIRA MRN: [id number] DOB/AGE: 05/22/1941 75 y.o.  Admit date: 12/07/2015 Referring Physician:  [place] Primary Physician: Claretta Fraise, MD Primary Cardiologist:  [facility] Reason for Consultation:  Elevated Troponin  Active Problems:   Iron deficiency anemia   Essential hypertension   COPD (chronic obstructive pulmonary disease) with emphysema (HCC)   Chronic systolic CHF (congestive heart failure) ([place])   Anticoagulant long-term use   Atrial fibrillation, chronic ([place])   CAP (community acquired pneumonia)   Pneumathemia ([place])   Acute renal failure superimposed on stage 3 chronic kidney disease (HCC)   Heme + stool   Decreased hemoglobin   GAVE (gastric antral vascular ectasia)   HPI:  75 y.o. with known ischemic DCM.  Admitted with 3-4 day history of increasing dyspnea. No chest pain.  Has significant COPD on oxygen at home  Sats down 84% with CXR showing LUL pneumonia with marked leukocytosis.  Troponins mildly elevated No acute ECG changes. Previous EF 30-40% by echo with old IMI.  Echo 3/6 read as EF 25-30%.  Clinically not particularly volume overloaded.  URI with cough and afebrile at this time.    CABG 1998:  IMI at that time then had stenting of RCA.  Last cath 07/2014 SVG to LAD occluded and had BMS to SVG PDA  LIMA was patent to mid LAD  EF 35% at cath.   Also history of moderate carotid disease.    On coumadin for afib.  History of GAVE.  Seen by Dr Carlean Purl for worsening anemia this admission and plans for EGD when doing better  ROS All other systems reviewed and negative except as noted above  Past Medical History  Diagnosis Date  . Unspecified essential hypertension   . Anemia   . Asthma   . Hepatomegaly   . Esophageal reflux   . Other and unspecified coagulation defects   . Hiatal hernia   . Personal history of colonic polyps 05/29/2010    TUBULAR ADENOMA  . CAD (coronary artery disease)   .  Hypothyroidism   . Gout   . Hyperlipidemia   . Gastric antral vascular ectasia 2013  . Atrial fibrillation ([place])   . History of blood transfusion ~ 2012    "blood count dropped; had to get 3 units"  . Arthritis     "hips; back" (07/09/2014)  . COPD (chronic obstructive pulmonary disease) (HCC)     on home oxygen, 2 liters at night10/2015  . Gallstones   . 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   . Cholecystitis 11/2013    Family History  Problem Relation Age of Onset  . Leukemia Mother   . Kidney disease Mother     kidney removed   . Colon cancer Neg Hx   . Heart attack Brother   . Hypertension      family   . Cancer Father   . Heart disease Father   . Stomach cancer Sister   . Stomach cancer Sister   . Early death Brother     Social History   Social History  . Marital Status: Married    Spouse Name: N/A  . Number of Children: N/A  . Years of Education: N/A   Occupational History  . retired    Social History Main Topics  . Smoking status: Former Smoker -- 1.00 packs/day for 42 years    Types: Cigarettes    Quit  date: 12/16/1996  . Smokeless tobacco: Never Used  . Alcohol Use: 0.6 oz/week    1 Cans of beer per week     Comment: 07/09/2014 "averages < 1 beer/wk", 11/26/14: denies ETOH  . Drug Use: No  . Sexual Activity: No   Other Topics Concern  . Not on file   Social History Narrative    Past Surgical History  Procedure Laterality Date  . Coronary angioplasty with stent placement  ~ 2008; 07/08/2014    "1 + 3"  . 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 Hernia repair    . Umbilical hernia repair  1970's  . Left and right heart catheterization with coronary/graft angiogram N/A 07/09/2014    Right and left heart cath, bare metal stent to SVG to RCA. Sinclair Grooms, MD;   . Colonoscopy  2013    Dr. Sharlett Iles: no polyps or evidence of active bleeding  . Esophagogastroduodenoscopy  2013    Dr. Sharlett Iles: normal duodenal folds, normal  esophagus, probable GAVE, negative H.pylori  . Givens capsule study  2013    Dr. Sharlett Iles: AVM at 75 and blood at 30 minutes beyond first duodenal image but not actual lesion seen. If persistent IDA, bleeding, recommend enteroscopy with ablation   . Cholecystomy tube  12/28/2014 - 01/06/2015    tube clogged with debris, removed and IR unable to place new tube.      Marland Kitchen amLODipine  10 mg Oral Daily  . azithromycin  500 mg Intravenous Q24H  . budesonide  0.25 mg Nebulization BID  . cefTRIAXone (ROCEPHIN)  IV  1 g Intravenous Q24H  . cholecalciferol  5,000 Units Oral Daily  . feeding supplement  1 Container Oral BID BM  . feeding supplement (ENSURE ENLIVE)  237 mL Oral BID BM  . ferrous sulfate  325 mg Oral Q breakfast  . fluticasone  1 spray Each Nare Daily  . ipratropium-albuterol  3 mL Nebulization Q4H  . isosorbide-hydrALAZINE  1 tablet Oral BID  . levothyroxine  112 mcg Oral QAC breakfast  . multivitamin with minerals  1 tablet Oral Daily  . [START ON 12/11/2015] pantoprazole (PROTONIX) IV  40 mg Intravenous Q12H  . pravastatin  20 mg Oral q1800  . sodium chloride flush  3 mL Intravenous Q12H   . sodium chloride 75 mL/hr at 12/09/15 1145  . pantoprozole (PROTONIX) infusion 8 mg/hr (12/09/15 0512)    Physical Exam: Blood pressure 155/73, pulse 91, temperature 98.5 F (36.9 C), temperature source Oral, resp. rate 24, height 5\' 10"  (1.778 m), weight 89.948 kg (198 lb 4.8 oz), SpO2 95 %.    Affect appropriate Chronically ill white male  HEENT: normal Neck supple with no adenopathy JVP normal bilateral bruits no thyromegaly Lungs Rhonchi and expiratory  wheezing and good diaphragmatic motion Heart:  S1/S2 no murmur, no rub, gallop or click PMI normal Abdomen: benighn, BS positve, no tenderness, no AAA no bruit.  No HSM or HJR Distal pulses intact with no bruits No edema Neuro non-focal Skin warm and dry No muscular weakness   Labs:   Lab Results  Component Value Date   WBC  14.1* 12/09/2015   HGB 8.2* 12/09/2015   HCT 25.7* 12/09/2015   MCV 97.7 12/09/2015   PLT 425* 12/09/2015    Recent Labs Lab 12/09/15 0419  NA 143  K 4.1  CL 110  CO2 22  BUN 35*  CREATININE 1.63*  CALCIUM 8.4*  GLUCOSE 111*  Lab Results  Component Value Date   TROPONINI 0.53* 12/08/2015    Lab Results  Component Value Date   CHOL 134 09/24/2015   CHOL 137 12/23/2014   CHOL 138 07/05/2014   Lab Results  Component Value Date   HDL 44 09/24/2015   HDL 46 12/23/2014   HDL 37.60* 07/05/2014   Lab Results  Component Value Date   LDLCALC 70 09/24/2015   LDLCALC 64 12/23/2014   LDLCALC 71 07/05/2014   Lab Results  Component Value Date   TRIG 101 09/24/2015   TRIG 137 12/23/2014   TRIG 147.0 07/05/2014   Lab Results  Component Value Date   CHOLHDL 3.0 09/24/2015   CHOLHDL 3.0 12/23/2014   CHOLHDL 4 07/05/2014   No results found for: LDLDIRECT    Radiology: Dg Chest 2 View  12/07/2015  CLINICAL DATA:  Cough. Weakness. Fever. Shortness of breath COPD. Asthma. Coronary artery disease. EXAM: CHEST  2 VIEW COMPARISON:  12/25/2014 FINDINGS: Prior median sternotomy. Midline trachea. Moderate cardiomegaly. Mild right hemidiaphragm elevation. No pleural effusion or pneumothorax. Diffuse peribronchial thickening. Left upper lobe patchy airspace disease. IMPRESSION: Left upper lobe airspace disease, most consistent with pneumonia. Followup PA and lateral chest X-ray is recommended in 3-4 weeks following trial of antibiotic therapy to ensure resolution and exclude underlying malignancy. Underlying cardiomegaly with chronic interstitial thickening, likely related to COPD/ chronic bronchitis. Electronically Signed   By: Abigail Miyamoto M.D.   On: 12/07/2015 11:40    EKG: Afib rate 90 PVC's non specific ST changes   ASSESSMENT AND PLAN:   Elevated Troponin:  No chest pain or acute ECG changes. Possibly demand ischemia. No condition to have heart cath with pneumonia , active  anemia and elevated Cr Will observe for now and consider elective cath when other issues resolved  CHF:  EF described at cath 07/2014 as 35% and now 25-30% by echo Doubt significant change Most of dyspnea from active pneumonia  BNP mildly elevated  Normal  Home dose of lasix 40 mg  Will resume in am At some point will need re evaluation for possible BiV AICD  Troponin:  No chest pain not a cath candidate now due to hypoxia, infection, elevated Cr and active anemia.  Consider elected right and left cath either before d/c or  Electively as outpatient Also to further risk stratify for AICD  Pneumonia:  Clinically this has been main issue with markedly abnormal exam leukocytosis and CXR LUL penumonia.  This is on top of baseline oxygen dependant COPD Continue with nebs and antibiotics follow CXR keep as dry as possible to prevent ARDS  CRF:  Do not over diurese consider starting home dose lasix in am   Afib:  Coumadin reversed due to anemia Heparin when ok with GI  Interestingly not on rate controlling drug at home  Signed: Jenkins Rouge 12/09/2015, 1:26 PM

## 2015-12-10 ENCOUNTER — Encounter: Payer: Self-pay | Admitting: Pharmacist

## 2015-12-10 DIAGNOSIS — I5021 Acute systolic (congestive) heart failure: Secondary | ICD-10-CM | POA: Insufficient documentation

## 2015-12-10 DIAGNOSIS — D500 Iron deficiency anemia secondary to blood loss (chronic): Secondary | ICD-10-CM

## 2015-12-10 LAB — BASIC METABOLIC PANEL
Anion gap: 12 (ref 5–15)
BUN: 38 mg/dL — ABNORMAL HIGH (ref 6–20)
CHLORIDE: 105 mmol/L (ref 101–111)
CO2: 21 mmol/L — ABNORMAL LOW (ref 22–32)
CREATININE: 1.65 mg/dL — AB (ref 0.61–1.24)
Calcium: 8.5 mg/dL — ABNORMAL LOW (ref 8.9–10.3)
GFR, EST AFRICAN AMERICAN: 45 mL/min — AB (ref >60)
GFR, EST NON AFRICAN AMERICAN: 39 mL/min — AB (ref >60)
Glucose, Bld: 187 mg/dL — ABNORMAL HIGH (ref 65–99)
POTASSIUM: 4.6 mmol/L (ref 3.5–5.1)
SODIUM: 138 mmol/L (ref 135–145)

## 2015-12-10 LAB — CBC
HCT: 26 % — ABNORMAL LOW (ref 39.0–52.0)
HEMOGLOBIN: 8.3 g/dL — AB (ref 13.0–17.0)
MCH: 30.7 pg (ref 26.0–34.0)
MCHC: 31.9 g/dL (ref 30.0–36.0)
MCV: 96.3 fL (ref 78.0–100.0)
PLATELETS: 458 10*3/uL — AB (ref 150–400)
RBC: 2.7 MIL/uL — AB (ref 4.22–5.81)
RDW: 13.8 % (ref 11.5–15.5)
WBC: 9.9 10*3/uL (ref 4.0–10.5)

## 2015-12-10 MED ORDER — CALCIUM CARBONATE ANTACID 500 MG PO CHEW
400.0000 mg | CHEWABLE_TABLET | Freq: Once | ORAL | Status: AC
  Administered 2015-12-10: 400 mg via ORAL
  Filled 2015-12-10: qty 2

## 2015-12-10 MED ORDER — METHYLPREDNISOLONE SODIUM SUCC 40 MG IJ SOLR
40.0000 mg | INTRAMUSCULAR | Status: DC
  Administered 2015-12-11: 40 mg via INTRAVENOUS
  Filled 2015-12-10: qty 1

## 2015-12-10 MED ORDER — FUROSEMIDE 10 MG/ML IJ SOLN
40.0000 mg | Freq: Two times a day (BID) | INTRAMUSCULAR | Status: DC
  Administered 2015-12-10 – 2015-12-11 (×3): 40 mg via INTRAVENOUS
  Filled 2015-12-10 (×2): qty 4

## 2015-12-10 MED ORDER — ALBUTEROL SULFATE (2.5 MG/3ML) 0.083% IN NEBU
2.5000 mg | INHALATION_SOLUTION | RESPIRATORY_TRACT | Status: DC | PRN
  Administered 2015-12-11 – 2015-12-13 (×2): 2.5 mg via RESPIRATORY_TRACT
  Filled 2015-12-10 (×2): qty 3

## 2015-12-10 MED ORDER — NAPHAZOLINE-GLYCERIN 0.012-0.2 % OP SOLN
1.0000 [drp] | Freq: Four times a day (QID) | OPHTHALMIC | Status: DC | PRN
  Administered 2015-12-10: 2 [drp] via OPHTHALMIC
  Filled 2015-12-10: qty 15

## 2015-12-10 MED ORDER — METHYLPREDNISOLONE SODIUM SUCC 40 MG IJ SOLR: 40.0000 mg | Freq: Two times a day (BID) | INTRAMUSCULAR | Status: DC

## 2015-12-10 MED ORDER — FUROSEMIDE 10 MG/ML IJ SOLN: 40.0000 mg | Freq: Once | INTRAMUSCULAR | Status: DC

## 2015-12-10 MED ORDER — IPRATROPIUM-ALBUTEROL 0.5-2.5 (3) MG/3ML IN SOLN
3.0000 mL | Freq: Four times a day (QID) | RESPIRATORY_TRACT | Status: DC
  Administered 2015-12-11 – 2015-12-13 (×11): 3 mL via RESPIRATORY_TRACT
  Filled 2015-12-10 (×11): qty 3

## 2015-12-10 NOTE — Progress Notes (Signed)
     [facility] Progress Note  Subjective:  Hgb 8.3.   Denies CP, says he is coughing but feels better than he did.   Objective:  Vital signs in last 24 hours: Temp:  [97.8 F (36.6 C)-99.2 F (37.3 C)] 97.8 F (36.6 C) (03/08 0645) Pulse Rate:  [60-93] 93 (03/08 0645) Resp:  [20] 20 (03/08 0645) BP: (131-153)/(48-91) 153/73 mmHg (03/08 0645) SpO2:  [94 %-97 %] 95 % (03/08 0814) Weight:  [204 lb (92.534 kg)] 204 lb (92.534 kg) (03/08 0645) Last BM Date: 12/06/15 General:   Alert,  Well-developed,    in NAD Heart: irreg Pulm;bilat rales Abdomen:  Soft, nontender and nondistended. Normal bowel sounds, without guarding, and without rebound.     Intake/Output from previous day: 03/07 0701 - 03/08 0700 In: 1175 [P.O.:600; I.V.:575] Out: 720 [Urine:720] Intake/Output this shift:    Lab Results:  Recent Labs  12/08/15 0156 12/09/15 0419 12/10/15 0444  WBC 21.3* 14.1* 9.9  HGB 8.5* 8.2* 8.3*  HCT 26.3* 25.7* 26.0*  PLT 398 425* 458*   PT/INR  Recent Labs  12/08/15 0754 12/09/15 0419  LABPROT 19.5* 17.8*  INR 1.64* 1.46      ASSESSMENT/PLAN:  Anemia, decreased Hgb heme + Hx gastric antral vasclar dysplasia Pneumonia EF 25-30% by echo 12/08/2015  Not ready for EGD yet.    LOS: 3 days   Hvozdovic, Vita Barley PA-C 12/10/2015, Pager [phone] Mon-Fri 8a-5p [phone] after 5p, weekends, holidays  Agree w/ Ms. Hvozdovic's note and mangement. Gatha Mayer, MD, [facility] [phone] (pager) [phone] after 5 PM, weekends and holidays  12/10/2015 4:55 PM

## 2015-12-10 NOTE — Progress Notes (Signed)
Pharmacist Heart Failure Core Measure Documentation  Assessment: [name] [name] has an EF documented as LV EF: 25% - 30% on 12/08/2015 by 2D Echo.  Rationale: Heart failure patients with left ventricular systolic dysfunction (LVSD) and an EF < 40% should be prescribed an angiotensin converting enzyme inhibitor (ACEI) or angiotensin receptor blocker (ARB) at discharge unless a contraindication is documented in the medical record.  This patient is not currently on an ACEI or ARB for HF.  This note is being placed in the record in order to provide documentation that a contraindication to the use of these agents is present for this encounter.  ACE Inhibitor or Angiotensin Receptor Blocker is contraindicated (specify all that apply)  []   ACEI allergy AND ARB allergy []   Angioedema []   Moderate or severe aortic stenosis []   Hyperkalemia []   Hypotension []   Renal artery stenosis [x]   Worsening renal function, preexisting renal disease or dysfunction   Gretta Arab PharmD, BCPS Pager [phone] 12/10/2015 9:42 AM

## 2015-12-10 NOTE — Care Management Important Message (Signed)
Important Message  Patient Details  Name: [name] [name] MRN: [id number] Date of Birth: 11-16-1940   Medicare Important Message Given:  Yes    Camillo Flaming 12/10/2015, 1:14 PMImportant Message  Patient Details  Name: [name] [name] MRN: [id number] Date of Birth: 20-Aug-1941   Medicare Important Message Given:  Yes    Camillo Flaming 12/10/2015, 1:14 PM

## 2015-12-10 NOTE — Progress Notes (Signed)
Patient Name: [name] [name] Date of Encounter: 12/10/2015  Active Problems:   Iron deficiency anemia   Essential hypertension   COPD (chronic obstructive pulmonary disease) with emphysema (HCC)   Chronic systolic CHF (congestive heart failure) (HCC)   Anticoagulant long-term use   Atrial fibrillation, chronic (HCC)   CAP (community acquired pneumonia)   Pneumathemia ([place])   Acute renal failure superimposed on stage 3 chronic kidney disease (HCC)   Heme + stool   Decreased hemoglobin   GAVE (gastric antral vascular ectasia)   Elevated troponin   Absolute anemia   Primary Cardiologist: Dr [name] [name] Patient Profile: 75 yo male w/ ICM, EF 25-30% by echo 12/08/2015, CABG 1998, HTN, atrial fib, GERD, GAVE, COPD. Admitted 03/05 w/ PNA/sepsis, cards consulted for elevated troponin  SUBJECTIVE: Using more O2 than at home (just uses at night). No chest pain, coughing up brownish sputum. Aware weight is up.  OBJECTIVE Filed Vitals:   12/09/15 2314 12/10/15 0404 12/10/15 0645 12/10/15 0814  BP:   153/73   Pulse:   93   Temp:   97.8 F (36.6 C)   TempSrc:   Oral   Resp:   20   Height:      Weight:   204 lb (92.534 kg)   SpO2: 95% 95% 97% 95%    Intake/Output Summary (Last 24 hours) at 12/10/15 0906 Last data filed at 12/10/15 0600  Gross per 24 hour  Intake    935 ml  Output    520 ml  Net    415 ml   Filed Weights   12/08/15 0507 12/09/15 0403 12/10/15 0645  Weight: 199 lb 11.8 oz (90.6 kg) 198 lb 4.8 oz (89.948 kg) 204 lb (92.534 kg)    PHYSICAL EXAM General: Well developed, well nourished, male in no acute distress. Head: Normocephalic, atraumatic.  Neck: Supple without bruits, JVD 9 cm. Lungs:  Resp regular and unlabored, rales bilaterally, slight wheeze. Heart: Irreg R&R, S1, S2, no S3, S4, or murmur; no rub. Abdomen: Soft, tender, non-distended, BS + x 4.  Extremities: No clubbing, cyanosis, edema.  Neuro: Alert and oriented X 3. Moves all extremities  spontaneously. Psych: Normal affect.  LABS: CBC: Recent Labs  12/09/15 0419 12/10/15 0444  WBC 14.1* 9.9  HGB 8.2* 8.3*  HCT 25.7* 26.0*  MCV 97.7 96.3  PLT 425* 458*   INR: Recent Labs  12/09/15 0419  INR [id number]   Basic Metabolic Panel: Recent Labs  12/08/15 0155  12/09/15 0419 12/10/15 0444  NA  --   < > 143 138  K  --   < > 4.1 4.6  CL  --   < > 110 105  CO2  --   < > 22 21*  GLUCOSE  --   < > 111* 187*  BUN  --   < > 35* 38*  CREATININE  --   < > 1.63* 1.65*  CALCIUM  --   < > 8.4* 8.5*  MG 2.3  --   --   --   < > = values in this interval not displayed.  Cardiac Enzymes: Recent Labs  12/07/15 1515 12/07/15 2009 12/08/15 0153  TROPONINI 0.53* 0.52* 0.53*    Recent Labs  12/07/15 1125  TROPIPOC 0.29*   BNP:  B NATRIURETIC PEPTIDE  Date/Time Value Ref Range Status  12/07/2015 10:56 AM 566.9* 0.0 - 100.0 pg/mL Final   Anemia Panel: Recent Labs  12/08/15 1418 12/09/15 0419  VITAMINB12  --  1233*  FOLATE 27.8  --   FERRITIN  --  78  TIBC  --  293  IRON  --  12*  RETICCTPCT 1.7  --     TELE:   Atrial fib, rate generally OK, PVCs, bigeminy and some 3 bt runs.     Radiology/Studies: Dg Chest 2 View 12/07/2015  CLINICAL DATA:  Cough. Weakness. Fever. Shortness of breath COPD. Asthma. Coronary artery disease. EXAM: CHEST  2 VIEW COMPARISON:  12/25/2014 FINDINGS: Prior median sternotomy. Midline trachea. Moderate cardiomegaly. Mild right hemidiaphragm elevation. No pleural effusion or pneumothorax. Diffuse peribronchial thickening. Left upper lobe patchy airspace disease. IMPRESSION: Left upper lobe airspace disease, most consistent with pneumonia. Followup PA and lateral chest X-ray is recommended in 3-4 weeks following trial of antibiotic therapy to ensure resolution and exclude underlying malignancy. Underlying cardiomegaly with chronic interstitial thickening, likely related to COPD/ chronic bronchitis. Electronically Signed   By: Abigail Miyamoto M.D.    On: 12/07/2015 11:40      Current Medications:  . amLODipine  10 mg Oral Daily  . azithromycin  500 mg Intravenous Q24H  . budesonide  0.25 mg Nebulization BID  . cefTRIAXone (ROCEPHIN)  IV  1 g Intravenous Q24H  . cholecalciferol  5,000 Units Oral Daily  . feeding supplement  1 Container Oral BID BM  . feeding supplement (ENSURE ENLIVE)  237 mL Oral BID BM  . ferrous sulfate  325 mg Oral Q breakfast  . fluticasone  1 spray Each Nare Daily  . furosemide  40 mg Oral Daily  . ipratropium-albuterol  3 mL Nebulization Q4H  . isosorbide-hydrALAZINE  1 tablet Oral BID  . levothyroxine  112 mcg Oral QAC breakfast  . methylPREDNISolone (SOLU-MEDROL) injection  60 mg Intravenous Q12H  . multivitamin with minerals  1 tablet Oral Daily  . [START ON 12/11/2015] pantoprazole (PROTONIX) IV  40 mg Intravenous Q12H  . pravastatin  20 mg Oral q1800  . sodium chloride flush  3 mL Intravenous Q12H   . pantoprozole (PROTONIX) infusion 8 mg/hr (12/10/15 0447)    ASSESSMENT AND PLAN: Elevated Troponin: No chest pain or acute ECG changes. No true crescendo/decrescendo pattern. Felt demand ischemia. No condition to have heart cath with pneumonia , active anemia and elevated Cr. Will observe for now and consider elective cath when other issues resolved  CHF: EF described at cath 07/2014 as 35%>>55% by echo 10/2014 and now 25-30% by echo. Doubt significant change Most of dyspnea from active pneumonia BNP mildly elevated. - With increasing weight, +I/O and increased O2 needs, agree with changing Lasix to IV today. Reassess in am. - At some point will need re evaluation for possible BiV AICD, EF may recover again, consider re-eval EF as OP and EP consult if no improvement  Troponin: No chest pain, not a cath candidate now due to hypoxia, infection, elevated Cr and active anemia.  - Consider elected right and left cath either before d/c or  Electively as outpatient Also to further risk stratify for  AICD  Pneumonia: Clinically this has been main issue with markedly abnormal exam, leukocytosis and CXR LUL penumonia. This is on top of baseline nighttime oxygen dependant COPD Continue with nebs and antibiotics follow CXR keep as dry as possible to prevent ARDS  CRF: Do not over diurese, hopefully can restart home dose lasix in am   Afib: Coumadin reversed due to anemia Heparin when ok with GI. -  Interestingly not on rate controlling drug at home, but rate  generally OK, even with acute illness  Chronic anticoagulation, coumadin, CHADS2VASC=4 - INR 4.48 on admit - reversed w/ vit K 10 mg - since INR subtherapeutic, pt on SCDs   Otherwise, per IM Active Problems:   Iron deficiency anemia   Essential hypertension   COPD (chronic obstructive pulmonary disease) with emphysema (HCC)   Chronic systolic CHF (congestive heart failure) (HCC)   Anticoagulant long-term use   Atrial fibrillation, chronic (HCC)   CAP (community acquired pneumonia)   Pneumathemia ([employer])   Acute renal failure superimposed on stage 3 chronic kidney disease (HCC)   Heme + stool   Decreased hemoglobin   GAVE (gastric antral vascular ectasia)   Elevated troponin   Absolute anemia   Signed, Rosaria Ferries , PA-C 9:06 AM 12/10/2015  Patient examined chart reviewed. Still with blood tinged sputum and wheezing Hb low but stable.  Waiting on EGD Discussed with Dr Carlean Purl waiting on cath Until possible ablation of GI ectasia.  Off anticoagulation currently despite afib Due to bleeding    Jenkins Rouge

## 2015-12-10 NOTE — Progress Notes (Addendum)
Patient Demographics  [name] [name], is a 75 y.o. male, DOB - 12-Feb-1941, [id number]  Admit date - 12/07/2015   Admitting Physician Albertine Patricia, MD  Outpatient Primary MD for the patient is Claretta Fraise, MD  LOS - 3   Chief Complaint  Patient presents with  . Shortness of Breath       Admission HPI/Brief narrative: 75 y.o. male, has a history of atrial fibrillation and heart disease ,gastroesophageal reflux disease hypertension hyperlipidemia , presents with complaints of cough and shortness of breath , chest x-ray with evidence of pneumonia, as well workup significant for acute renal failure and elevated troponin , anemia with Hemoccult-positive stool with supratherapeutic INR, admitted for further workup.  Subjective:   [name] [name] has a cough with yellow sputum and dyspnea on exertion- no nausea, vomiting, diarrhea.   Assessment & Plan   Sepsis secondary to pneumonia/ Acute on chronic hypoxic respiratory failure - Patient presents with leukocytosis, tachypnea and tachycardia, secondary to community-acquired pneumonia - Pneumonia order set  on admission, - sputum culture growing Enterobacter Asburiae - blood culture no growth to date, strep pneumoniae antigen nonreactive,  - Continue with Rocephin and azithromycin - leukocytosis resolved  COPD - No active wheezing, will continue with Pulmicort, will continue with DuoNeb every 6 hours and when necessary albuterol - wean Solumedrol   Elevated troponin -  most likely in the setting of acute renal failure and demand ischemia from hypoxia and sepsis. - Troponins have plateaued 0.53>0.52>0.53  Anemia/ GI bleed - Hb 1.5 on 10/21/15 and now in 8-9 range - Iron saturaion low and Ferretin normal (possibly higher than his usual due to it being an acute phase reactant?) - on daily Iron at home patient is Hemoccult positive - cont to  hold  aspirin and Coumadin - cont Protonix drip to IV twice a day - GI consulted- will have EGD when resp status improved  Acute on chronic systolic CHF - EF 123456 % - new drop from EF of 55% in the past -diurese with IV Lasix - follow I and O  A. Fib/ Chronic anticoagulation with supratherapeutic INR - Heart rate controlled without rate controlling agent - INR 4/48 on admission-reversed with Vit K-  hold warfarin for GI bleed  Hypertension - Continue with home medication- Norvasc, Bidil  Acute on chronic kidney disease stage III - Prerenal - improved with hydration   Code Status: Full  Family Communication: D/W patient  Disposition Plan: pending further work up.   Procedures  2 D ECHO Study Conclusions  - Left ventricle: The cavity size was severely dilated. Wall  thickness was normal. Systolic function was severely reduced. The  estimated ejection fraction was in the range of 25% to 30%. - Mitral valve: There was mild to moderate regurgitation. - Left atrium: The atrium was moderately dilated. - Right ventricle: Systolic function was mildly to moderately  reduced. - Right atrium: The atrium was mildly dilated. - Pulmonary arteries: PA peak pressure: 47 mm Hg (S).    Consults   GI   Medications  Scheduled Meds: . amLODipine  10 mg Oral Daily  . azithromycin  500 mg Intravenous Q24H  . budesonide  0.25 mg Nebulization BID  . cefTRIAXone (ROCEPHIN)  IV  1 g Intravenous Q24H  . cholecalciferol  5,000 Units Oral Daily  . feeding supplement  1 Container Oral BID BM  . feeding supplement (ENSURE ENLIVE)  237 mL Oral BID BM  . ferrous sulfate  325 mg Oral Q breakfast  . fluticasone  1 spray Each Nare Daily  . furosemide  40 mg Intravenous BID  . ipratropium-albuterol  3 mL Nebulization Q4H  . isosorbide-hydrALAZINE  1 tablet Oral BID  . levothyroxine  112 mcg Oral QAC breakfast  . methylPREDNISolone (SOLU-MEDROL) injection  60 mg Intravenous Q12H  .  multivitamin with minerals  1 tablet Oral Daily  . [START ON 12/11/2015] pantoprazole (PROTONIX) IV  40 mg Intravenous Q12H  . pravastatin  20 mg Oral q1800  . sodium chloride flush  3 mL Intravenous Q12H   Continuous Infusions:   PRN Meds:.acetaminophen **OR** acetaminophen, albuterol, ALPRAZolam, docusate sodium, guaiFENesin, HYDROcodone-acetaminophen, naphazoline-glycerin, nitroGLYCERIN, promethazine  DVT Prophylaxis   SCDs   Lab Results  Component Value Date   PLT 458* 12/10/2015    Antibiotics   Anti-infectives    Start     Dose/Rate Route Frequency Ordered Stop   12/08/15 1430  azithromycin (ZITHROMAX) 500 mg in dextrose 5 % 250 mL IVPB     500 mg 250 mL/hr over 60 Minutes Intravenous Every 24 hours 12/07/15 1629 12/14/15 1429   12/08/15 1400  cefTRIAXone (ROCEPHIN) 1 g in dextrose 5 % 50 mL IVPB     1 g 100 mL/hr over 30 Minutes Intravenous Every 24 hours 12/07/15 1629 12/14/15 1359   12/07/15 1330  cefTRIAXone (ROCEPHIN) 1 g in dextrose 5 % 50 mL IVPB     1 g 100 mL/hr over 30 Minutes Intravenous  Once 12/07/15 1317 12/07/15 1415   12/07/15 1330  azithromycin (ZITHROMAX) 500 mg in dextrose 5 % 250 mL IVPB     500 mg 250 mL/hr over 60 Minutes Intravenous  Once 12/07/15 1317 12/07/15 1524          Objective:   Filed Vitals:   12/10/15 0645 12/10/15 0814 12/10/15 1125 12/10/15 1500  BP: 153/73   147/54  Pulse: 93   100  Temp: 97.8 F (36.6 C)   98.3 F (36.8 C)  TempSrc: Oral   Oral  Resp: 20   20  Height:      Weight: 92.534 kg (204 lb)     SpO2: 97% 95% 98% 96%    Wt Readings from Last 3 Encounters:  12/10/15 92.534 kg (204 lb)  12/02/15 89.359 kg (197 lb)  10/30/15 91.082 kg (200 lb 12.8 oz)     Intake/Output Summary (Last 24 hours) at 12/10/15 1608 Last data filed at 12/10/15 1450  Gross per 24 hour  Intake    935 ml  Output    820 ml  Net    115 ml     Physical Exam  Awake Alert, Oriented X 3,  Supple Neck,No JVD,  Symmetrical Chest  wall movement, Good air movement bilaterally, no wheezing Irregular,No Gallops,Rubs or new Murmurs, No Parasternal Heave +ve B.Sounds, Abd Soft, No tenderness, No organomegaly appriciated, No rebound, guarding or rigidity. No Cyanosis, Clubbing or edema, No new Rash or bruise     Data Review   Micro Results Recent Results (from the past 240 hour(s))  Culture, blood (routine x 2) Call MD if unable to obtain prior to antibiotics being given     Status: None (Preliminary result)   Collection Time: 12/07/15  5:00 PM  Result  Value Ref Range Status   Specimen Description BLOOD  Final   Special Requests NONE  Final   Culture   Final    NO GROWTH 3 DAYS Performed at [facility]    Report Status PENDING  Incomplete  Culture, blood (routine x 2) Call MD if unable to obtain prior to antibiotics being given     Status: None (Preliminary result)   Collection Time: 12/07/15  5:09 PM  Result Value Ref Range Status   Specimen Description BLOOD  Final   Special Requests NONE  Final   Culture   Final    NO GROWTH 3 DAYS Performed at [facility]    Report Status PENDING  Incomplete  Culture, sputum-assessment     Status: None   Collection Time: 12/07/15  6:00 PM  Result Value Ref Range Status   Specimen Description SPUTUM  Final   Special Requests NONE  Final   Sputum evaluation   Final    THIS SPECIMEN IS ACCEPTABLE. RESPIRATORY CULTURE REPORT TO FOLLOW.   Report Status 12/07/2015 FINAL  Final  Culture, respiratory (NON-Expectorated)     Status: None   Collection Time: 12/07/15  6:00 PM  Result Value Ref Range Status   Specimen Description SPUTUM  Final   Special Requests NONE  Final   Gram Stain   Final    ABUNDANT WBC PRESENT, PREDOMINANTLY PMN FEW SQUAMOUS EPITHELIAL CELLS PRESENT MODERATE GRAM POSITIVE COCCI IN PAIRS RARE GRAM VARIABLE ROD Performed at [facility]    Culture   Final    MODERATE ENTEROBACTER ASBURIAE Culture reincubated for better  growth Performed at [facility]    Report Status 12/10/2015 FINAL  Final    Radiology Reports Dg Chest 2 View  12/07/2015  CLINICAL DATA:  Cough. Weakness. Fever. Shortness of breath COPD. Asthma. Coronary artery disease. EXAM: CHEST  2 VIEW COMPARISON:  12/25/2014 FINDINGS: Prior median sternotomy. Midline trachea. Moderate cardiomegaly. Mild right hemidiaphragm elevation. No pleural effusion or pneumothorax. Diffuse peribronchial thickening. Left upper lobe patchy airspace disease. IMPRESSION: Left upper lobe airspace disease, most consistent with pneumonia. Followup PA and lateral chest X-ray is recommended in 3-4 weeks following trial of antibiotic therapy to ensure resolution and exclude underlying malignancy. Underlying cardiomegaly with chronic interstitial thickening, likely related to COPD/ chronic bronchitis. Electronically Signed   By: Abigail Miyamoto M.D.   On: 12/07/2015 11:40     CBC  Recent Labs Lab 12/07/15 1056 12/07/15 2009 12/08/15 0156 12/09/15 0419 12/10/15 0444  WBC 30.4*  --  21.3* 14.1* 9.9  HGB 9.5* 9.2* 8.5* 8.2* 8.3*  HCT 28.8* 29.0* 26.3* 25.7* 26.0*  PLT 418*  --  398 425* 458*  MCV 98.3  --  98.1 97.7 96.3  MCH 32.4  --  31.7 31.2 30.7  MCHC 33.0  --  32.3 31.9 31.9  RDW 13.7  --  13.9 14.1 13.8    Chemistries   Recent Labs Lab 12/07/15 1056 12/08/15 0155 12/08/15 0156 12/09/15 0419 12/10/15 0444  NA 141  --  142 143 138  K 4.6  --  4.5 4.1 4.6  CL 106  --  109 110 105  CO2 21*  --  22 22 21*  GLUCOSE 117*  --  119* 111* 187*  BUN 35*  --  36* 35* 38*  CREATININE 1.85*  --  1.72* 1.63* 1.65*  CALCIUM 8.8*  --  8.4* 8.4* 8.5*  MG  --  2.3  --   --   --    ------------------------------------------------------------------------------------------------------------------  estimated creatinine clearance is 43.5 mL/min (by C-G formula based on Cr of  1.65). ------------------------------------------------------------------------------------------------------------------ No results for input(s): HGBA1C in the last 72 hours. ------------------------------------------------------------------------------------------------------------------ No results for input(s): CHOL, HDL, LDLCALC, TRIG, CHOLHDL, LDLDIRECT in the last 72 hours. ------------------------------------------------------------------------------------------------------------------ No results for input(s): TSH, T4TOTAL, T3FREE, THYROIDAB in the last 72 hours.  Invalid input(s): FREET3 ------------------------------------------------------------------------------------------------------------------  Recent Labs  12/08/15 1418 12/09/15 0419  VITAMINB12  --  1233*  FOLATE 27.8  --   FERRITIN  --  78  TIBC  --  293  IRON  --  12*  RETICCTPCT 1.7  --     Coagulation profile  Recent Labs Lab 12/04/15 0946 12/07/15 1052 12/08/15 0754 12/09/15 0419  INR 1.7 4.48* 1.64* 1.46    No results for input(s): DDIMER in the last 72 hours.  Cardiac Enzymes  Recent Labs Lab 12/07/15 1515 12/07/15 2009 12/08/15 0153  TROPONINI 0.53* 0.52* 0.53*   ------------------------------------------------------------------------------------------------------------------ Invalid input(s): POCBNP     Time Spent in minutes   35 minutes   Tivis Wherry M.D on 12/10/2015 at 4:08 PM  Pager -   [email or web address] - password [facility]  [facility]   Office  [phone]

## 2015-12-11 DIAGNOSIS — D528 Other folate deficiency anemias: Secondary | ICD-10-CM

## 2015-12-11 DIAGNOSIS — D509 Iron deficiency anemia, unspecified: Secondary | ICD-10-CM

## 2015-12-11 DIAGNOSIS — I10 Essential (primary) hypertension: Secondary | ICD-10-CM

## 2015-12-11 DIAGNOSIS — J439 Emphysema, unspecified: Secondary | ICD-10-CM

## 2015-12-11 DIAGNOSIS — I5021 Acute systolic (congestive) heart failure: Secondary | ICD-10-CM

## 2015-12-11 LAB — CBC
HCT: 23.6 % — ABNORMAL LOW (ref 39.0–52.0)
HEMATOCRIT: 23.6 % — AB (ref 39.0–52.0)
HEMOGLOBIN: 7.9 g/dL — AB (ref 13.0–17.0)
Hemoglobin: 7.7 g/dL — ABNORMAL LOW (ref 13.0–17.0)
MCH: 31.4 pg (ref 26.0–34.0)
MCH: 30.7 pg (ref 26.0–34.0)
MCHC: 32.6 g/dL (ref 30.0–36.0)
MCHC: 33.5 g/dL (ref 30.0–36.0)
MCV: 96.3 fL (ref 78.0–100.0)
MCV: 91.8 fL (ref 78.0–100.0)
PLATELETS: 488 10*3/uL — AB (ref 150–400)
Platelets: 524 10*3/uL — ABNORMAL HIGH (ref 150–400)
RBC: 2.45 MIL/uL — ABNORMAL LOW (ref 4.22–5.81)
RBC: 2.57 MIL/uL — AB (ref 4.22–5.81)
RDW: 13.9 % (ref 11.5–15.5)
RDW: 13.7 % (ref 11.5–15.5)
WBC: 18.2 10*3/uL — AB (ref 4.0–10.5)
WBC: 16.9 10*3/uL — AB (ref 4.0–10.5)

## 2015-12-11 LAB — BASIC METABOLIC PANEL
ANION GAP: 11 (ref 5–15)
BUN: 46 mg/dL — AB (ref 6–20)
CALCIUM: 8.9 mg/dL (ref 8.9–10.3)
CO2: 23 mmol/L (ref 22–32)
Chloride: 104 mmol/L (ref 101–111)
Creatinine, Ser: 1.73 mg/dL — ABNORMAL HIGH (ref 0.61–1.24)
GFR calc Af Amer: 42 mL/min — ABNORMAL LOW (ref >60)
GFR, EST NON AFRICAN AMERICAN: 37 mL/min — AB (ref >60)
GLUCOSE: 139 mg/dL — AB (ref 65–99)
Potassium: 4.4 mmol/L (ref 3.5–5.1)
Sodium: 138 mmol/L (ref 135–145)

## 2015-12-11 MED ORDER — SODIUM CHLORIDE 0.9 % IV SOLN
INTRAVENOUS | Status: DC
  Administered 2015-12-12: 12:00:00 via INTRAVENOUS

## 2015-12-11 MED ORDER — BOOST PLUS PO LIQD
237.0000 mL | Freq: Three times a day (TID) | ORAL | Status: DC
  Administered 2015-12-12 – 2015-12-13 (×4): 237 mL via ORAL
  Filled 2015-12-11 (×8): qty 237

## 2015-12-11 MED ORDER — FUROSEMIDE 40 MG PO TABS
40.0000 mg | ORAL_TABLET | Freq: Every day | ORAL | Status: DC
  Administered 2015-12-11 – 2015-12-13 (×3): 40 mg via ORAL
  Filled 2015-12-11 (×3): qty 1

## 2015-12-11 MED ORDER — DEXTROSE 5 % IV SOLN
500.0000 mg | INTRAVENOUS | Status: AC
  Administered 2015-12-12: 500 mg via INTRAVENOUS
  Filled 2015-12-11 (×2): qty 500

## 2015-12-11 MED ORDER — PREDNISONE 20 MG PO TABS
20.0000 mg | ORAL_TABLET | Freq: Every day | ORAL | Status: DC
  Administered 2015-12-12: 20 mg via ORAL
  Filled 2015-12-11: qty 1

## 2015-12-11 NOTE — Progress Notes (Signed)
Nutrition Follow-up  DOCUMENTATION CODES:   Not applicable  INTERVENTION:  - Will d/c Boost Breeze and Ensure Enlive - Will order Boost Plus TID, each supplement provides 360 kcal and 14 grams of protein. - RD will continue to monitor for additional nutrition-related needs  NUTRITION DIAGNOSIS:   Inadequate oral intake related to acute illness, chronic illness, nausea as evidenced by per patient/family report. -ongoing, slightly improving  GOAL:   Patient will meet greater than or equal to 90% of their needs -unmet  MONITOR:   PO intake, Supplement acceptance, Weight trends, Labs, I & O's  ASSESSMENT:   75 y.o. male, has a history of atrial fibrillation and heart disease ,gastroesophageal reflux disease hypertension hyperlipidemia , presents with complaints of cough and shortness of breath over last week, patient reports symptoms started with cough last week, fever, poor appetite, started to develop dyspnea, productive, with blood-tinged sputum, chest discomfort with cough, in ED workup was significant for leukocytosis of 30,000, acute on chronic renal failure, with creatinine of 2.8, afebrile in ED, hypoxic 86% on room air, chest x-ray significant for left upper lobe airspace disease, started on Rocephin and azithromycin, as well workup significant for elevated troponins, denies any chest pain, patient on warfarin for A. fib, with supratherapeutic INR at 4.4, and with anemia with hemoglobin of 9.5, baseline is 12. Denies any melena, hematochezia, or coffee-ground emesis.  3/9 Per chart review, pt ate 50% of breakfast 3/7 and no other intakes documented since previous assessment. Pt reports he was able to eat a few bites each of eggs and grits this AM. He states that last night he drank 50% of Ensure Enlive and subsequently experienced nausea and vomiting. This was the first time pt had drank Ensure since order was placed 3/6. Pt likes Boost Plus and tolerates this supplement well and is  interested in receiving it in place of Ensure Enlive.   Pt not fully meeting needs currently. No muscle or fat wasting noted. Chart review indicates 5 lb weight gain from 3/6-3/8. Medications reviewed; 40 mg IV Lasix BID, daily multivitamin. Labs reviewed; BUN/creatinine elevated and trending up, GFR: 37.    3/6 - No intakes documented since admission.  - Pt very drowsy during RD visit; he does provide some information and the remainder of information is provided by pt's family, who is at bedside.  - Family reports that pt did not eat breakfast this AM but that he has been sipping on Ensure (pt has consumed ~50% of bottle of Ensure this AM).  - Pt denies abdominal discomfort or nausea at this time but states he was experiencing these sensations for the past 1-3 weeks PTA.  - Per pt and family report, pt's appetite has been decreasing the past 3 weeks and the past 3 days he has had very minimal to no PO intake.  - He was drinking Boost occasionally PTA to supplement poor intakes.  - Family reports that pt recently saw Dr. Hilarie Fredrickson related to gallbladder issues and was provided with information pertaining to a low-fat diet at that appointment.  - Pt states that PTA he was having nausea and increased discomfort when consuming fatty foods and red meat. 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  - Pt states UBW of 245 lbs and that he has been losing weight to reach weight of 180 lbs in the past 4 months. - Chart review indicates current weight of 199 lbs and that pt's weight has been stable [id number]  lbs) over the past 8 months.  - Will continue to monitor weight trends during admission.  - Pt prefers physical assessment be done at follow-up as he is very drowsy this AM.  - Likely not fully meeting needs PTA but unable to state any degree of malnutrition based on nutrition-related parameters at this time.    Diet Order:  Diet Heart Room service appropriate?: Yes; Fluid  consistency:: Thin  Skin:  Reviewed, no issues  Last BM:  3/6  Height:   Ht Readings from Last 1 Encounters:  12/07/15 5\' 10"  (1.778 m)    Weight:   Wt Readings from Last 1 Encounters:  12/10/15 204 lb (92.534 kg)    Ideal Body Weight:  75.45 kg (kg)  BMI:  Body mass index is 29.27 kg/(m^2).  Estimated Nutritional Needs:   Kcal:  1650-1850  Protein:  80-90 grams  Fluid:  >/= 2 L/day  EDUCATION NEEDS:   No education needs identified at this time      Jarome Matin, RD, LDN Inpatient Clinical Dietitian Pager # [phone] After hours/weekend pager # [phone]

## 2015-12-11 NOTE — Progress Notes (Signed)
     [facility] Progress Note  Subjective:  Sitting in chair, watching basketball game. Says he feels much better today. Oxygen at 2 liters. WBC elevated at 18.2. Hgb 7.7.Sayshe had a dark BM yesterday, none today.   Objective:  Vital signs in last 24 hours: Temp:  [97.6 F (36.4 C)-98.3 F (36.8 C)] 97.9 F (36.6 C) (03/09 0453) Pulse Rate:  [44-100] 48 (03/09 0453) Resp:  [20] 20 (03/09 0453) BP: (147-156)/(54-97) 153/97 mmHg (03/09 0453) SpO2:  [96 %-98 %] 96 % (03/09 1032) Last BM Date: 12/08/15 General:   Alert,  Well-developed,    in NAD Heart:  irreg irreg Pulm;lungs clear Abdomen:  Soft, nontender and nondistended. Normal bowel sounds, without guarding, and without rebound.   Extremities:  Without edema. Neurologic:  Alert and  oriented x4;  grossly normal neurologically. Psych: Alert and cooperative. Normal mood and affect.  Intake/Output from previous day: 03/08 0701 - 03/09 0700 In: 240 [P.O.:240] Out: 1350 [Urine:1350] Intake/Output this shift: Total I/O In: 240 [P.O.:240] Out: 500 [Urine:500]  Lab Results:  Recent Labs  12/09/15 0419 12/10/15 0444 12/11/15 0418  WBC 14.1* 9.9 18.2*  HGB 8.2* 8.3* 7.7*  HCT 25.7* 26.0* 23.6*  PLT 425* 458* 488*   BMET  Recent Labs  12/09/15 0419 12/10/15 0444 12/11/15 0418  NA 143 138 138  K 4.1 4.6 4.4  CL 110 105 104  CO2 22 21* 23  GLUCOSE 111* 187* 139*  BUN 35* 38* 46*  CREATININE 1.63* 1.65* 1.73*  CALCIUM 8.4* 8.5* 8.9     ASSESSMENT/PLAN:  Anemia, decreased Hgb heme + Hx gastric antral vasclar dysplasia Pneumonia EF 25-30% by echo 12/08/2015  Will plan on EGD tomorrow.    LOS: 4 days   Hvozdovic, Vita Barley PA-C 12/11/2015, Pager [phone] Mon-Fri 8a-5p [phone] after 5p, weekends, holidays    [facility] Attending   I have taken an interval history, reviewed the chart and examined the patient. I agree with the Advanced Practitioner's note, impression and recommendations.    Gatha Mayer, MD, [facility] [phone] (pager) [phone] after 5 PM, weekends and holidays  12/11/2015 5:06 PM

## 2015-12-11 NOTE — Progress Notes (Signed)
Patient Name: [name] [name] Date of Encounter: 12/11/2015     Active Problems:   Iron deficiency anemia   Essential hypertension   COPD (chronic obstructive pulmonary disease) with emphysema (HCC)   Chronic systolic CHF (congestive heart failure) (HCC)   Anticoagulant long-term use   Atrial fibrillation, chronic (HCC)   CAP (community acquired pneumonia)   Pneumathemia ([place])   Acute renal failure superimposed on stage 3 chronic kidney disease (HCC)   Heme + stool   Decreased hemoglobin   GAVE (gastric antral vascular ectasia)   Elevated troponin   Absolute anemia   Acute systolic heart failure (HCC)    SUBJECTIVE  Breathing better after IV lasix. No CP but does note worsening exertional SOB before this admission: "thinks he needs a stent."  CURRENT MEDS . amLODipine  10 mg Oral Daily  . azithromycin  500 mg Intravenous Q24H  . budesonide  0.25 mg Nebulization BID  . cefTRIAXone (ROCEPHIN)  IV  1 g Intravenous Q24H  . cholecalciferol  5,000 Units Oral Daily  . feeding supplement  1 Container Oral BID BM  . feeding supplement (ENSURE ENLIVE)  237 mL Oral BID BM  . ferrous sulfate  325 mg Oral Q breakfast  . fluticasone  1 spray Each Nare Daily  . furosemide  40 mg Intravenous BID  . ipratropium-albuterol  3 mL Nebulization Q6H  . isosorbide-hydrALAZINE  1 tablet Oral BID  . levothyroxine  112 mcg Oral QAC breakfast  . methylPREDNISolone (SOLU-MEDROL) injection  40 mg Intravenous Q24H  . multivitamin with minerals  1 tablet Oral Daily  . pantoprazole (PROTONIX) IV  40 mg Intravenous Q12H  . pravastatin  20 mg Oral q1800  . sodium chloride flush  3 mL Intravenous Q12H    OBJECTIVE  Filed Vitals:   12/10/15 2025 12/10/15 2137 12/11/15 0143 12/11/15 0453  BP: 156/59   153/97  Pulse: 44   48  Temp: 97.6 F (36.4 C)   97.9 F (36.6 C)  TempSrc: Oral   Oral  Resp: 20   20  Height:      Weight:      SpO2: 97% 98% 97% 97%    Intake/Output Summary (Last 24  hours) at 12/11/15 0854 Last data filed at 12/11/15 0301  Gross per 24 hour  Intake    240 ml  Output   1350 ml  Net  -1110 ml   Filed Weights   12/08/15 0507 12/09/15 0403 12/10/15 0645  Weight: 199 lb 11.8 oz (90.6 kg) 198 lb 4.8 oz (89.948 kg) 204 lb (92.534 kg)    PHYSICAL EXAM  General: Pleasant, NAD. Neuro: Alert and oriented X 3. Moves all extremities spontaneously. Psych: Normal affect. HEENT:  Normal  Neck: Supple without bruits or JVD. Lungs:  Resp regular and unlabored, decreased breath sounds at bases. Heart: irreg irreg. no s3, s4, or murmurs. Abdomen: Soft, non-tender, non-distended, BS + x 4.  Extremities: No clubbing, cyanosis or edema- wearing SCDs. DP/PT/Radials 2+ and equal bilaterally.  Accessory Clinical Findings  CBC  Recent Labs  12/10/15 0444 12/11/15 0418  WBC 9.9 18.2*  HGB 8.3* 7.7*  HCT 26.0* 23.6*  MCV 96.3 96.3  PLT 458* [id number]*   Basic Metabolic Panel  Recent Labs  12/10/15 0444 12/11/15 0418  NA 138 138  K 4.6 4.4  CL 105 104  CO2 21* 23  GLUCOSE 187* 139*  BUN 38* 46*  CREATININE 1.65* 1.73*  CALCIUM 8.5* 8.9    TELE  afib with ventricular bigminy and freq PVCs noted on tele. HR in high 90s   Radiology/Studies  Dg Chest 2 View  12/07/2015  CLINICAL DATA:  Cough. Weakness. Fever. Shortness of breath COPD. Asthma. Coronary artery disease. EXAM: CHEST  2 VIEW COMPARISON:  12/25/2014 FINDINGS: Prior median sternotomy. Midline trachea. Moderate cardiomegaly. Mild right hemidiaphragm elevation. No pleural effusion or pneumothorax. Diffuse peribronchial thickening. Left upper lobe patchy airspace disease. IMPRESSION: Left upper lobe airspace disease, most consistent with pneumonia. Followup PA and lateral chest X-ray is recommended in 3-4 weeks following trial of antibiotic therapy to ensure resolution and exclude underlying malignancy. Underlying cardiomegaly with chronic interstitial thickening, likely related to COPD/ chronic  bronchitis. Electronically Signed   By: Abigail Miyamoto M.D.   On: 12/07/2015 11:40     2D ECHO : 12/08/2015 LV EF: 25% - 30% Study Conclusions - Left ventricle: The cavity size was severely dilated. Wall  thickness was normal. Systolic function was severely reduced. The  estimated ejection fraction was in the range of 25% to 30%. - Mitral valve: There was mild to moderate regurgitation. - Left atrium: The atrium was moderately dilated. - Right ventricle: Systolic function was mildly to moderately  reduced. - Right atrium: The atrium was mildly dilated. - Pulmonary arteries: PA peak pressure: 47 mm Hg (S). Impressions - Apical windows are foreshortened. Frequent PVCs throughout study  Make evaluation of function difficult.   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  Elevated Troponin: No chest pain or acute ECG changes. No true crescendo/decrescendo pattern. Has had worsening dyspnea on exertion over the recent past. Felt demand ischemia in the setting of sepsis and PNA. Plan for possible L/RHC after possible ablation of GI ectasia  CAD: h/o CABG + PCI/BMS to the SVB-RCA in 2015. Continue statin. No BB due to hx of pauses and no ASA with anemia and FOBT + stools.   Chronic systolic CHF: EF described at cath 07/2014 as 35%>>55% by echo 10/2014 and now 25-30% by echo. Most of dyspnea from active pneumonia BNP mildly elevated at 566 - With increasing weight, +I/O and increased O2 needs, he was started on IV lasix. Net neg 1.1 L overnight but still net postive. Creat starting to bump again, would resume his home PO lasix at 40mg  daily.  - continue BiDil. On no BB due to hx of sinus pauses of AVN blocking agents.   Pneumonia: Clinically this has been  main issue with markedly abnormal exam, leukocytosis and CXR LUL penumonia. This is on top of baseline nighttime oxygen dependant COPD. Now WBC worsening again.   [id number] 1.65--> 1.73  Afib:he has a history of  sinus pauses of AVN blocking agents, therefore on no rate control. His HR is in 90s currently.  - On chronic anticoagulation with coumadin for CHADS2VASC=4 - INR 4.48 on admit.  Coumadin reversed due to anemia Heparin when ok with GI. - since INR subtherapeutic, pt on SCDs  Anemia with Hemoccult-positive stool with supratherapeutic INR: coumadin reversed. H/H falling again. Hx gastric antral vasclar dysplasia. GI following and he is not felt ready for EGD yet   Ventricular ectopy: ventricular bigminy and freq PVCs noted on tele.   Judy Pimple PA-C  Pager [phone]  Breathing better Oxygen  decreased.  Should be ok for EGD today or tomorrow Definitely before weekend Right now leaning toward outpatient f/u with Dr Tamala Julian for elective right and left cath for possible AICD And ischemic DCM  Jenkins Rouge

## 2015-12-11 NOTE — Progress Notes (Signed)
Patient Demographics  [name] [name], is a 75 y.o. male, DOB - [birth year], [id number]  Admit date - 12/07/2015   Admitting Physician Albertine Patricia, MD  Outpatient Primary MD for the patient is Claretta Fraise, MD  LOS - 4   Chief Complaint  Patient presents with  . Shortness of Breath       Admission HPI/Brief narrative: 75 y.o. male, has a history of atrial fibrillation and heart disease ,gastroesophageal reflux disease hypertension hyperlipidemia , presents with complaints of cough and shortness of breath , chest x-ray with evidence of pneumonia, as well workup significant for acute renal failure and elevated troponin , anemia with Hemoccult-positive stool with supratherapeutic INR, admitted for further workup.  Subjective:   [name] [name] has a mild cough still- mild dyspnea on exertion- no nausea, vomiting, diarrhea.   Assessment & Plan   Sepsis secondary to pneumonia/ Acute on chronic hypoxic respiratory failure - Patient presents with leukocytosis, tachypnea and tachycardia, secondary to community-acquired pneumonia - sputum culture growing Enterobacter Asburiae- sensitivities pending- per ID PCN, Cephalosporins and Levaquin would all provide adequate coverage - blood culture no growth to date, strep pneumoniae antigen nonreactive,  - Continue with Rocephin - d/c azithromycin today (5 day course) - leukocytosis resolved - has been weaned down to 2 L - will need repeat CXR in 3-4 wks  COPD - will continue with Pulmicort, DuoNeb every 6 hours and when necessary albuterol - wean off Solumedrol to low dose Prednisone tomorrow  Anemia/ GI bleed - Hb 1.5 on 10/21/15 and now down to 7.7- will recheck CBC- if still low tomorrow, will consider transfusion  - Iron saturaion low and Ferretin normal (possibly higher than his usual due to it being an acute phase reactant?) - on daily Iron at  home - Hemoccult positive- GI consulted- will have EGD when resp status improved- possibly tomorrow - cont to  hold aspirin and Coumadin - changed Protonix drip to IV twice a day  Elevated troponin -  most likely in the setting of acute renal failure and demand ischemia from hypoxia and sepsis. 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 Fib/ Chronic anticoagulation with supratherapeutic INR - Heart rate controlled without rate controlling agent- having some bradycardic episodes - INR 4/48 on admission-reversed with Vit K-  hold warfarin for GI bleed  Hypertension - Continue with home medication- Norvasc, Bidil  Acute on chronic kidney disease stage III - Prerenal - improved with hydration   Code Status: Full  Family Communication: D/W patient  Disposition Plan: EGD pending-    Procedures  2 D ECHO Study Conclusions  - Left ventricle: The cavity size was severely dilated. Wall  thickness was normal. Systolic function was severely reduced. The  estimated ejection fraction was in the range of 25% to 30%. - Mitral valve: There was mild to moderate regurgitation. - Left atrium: The atrium was moderately dilated. - Right ventricle: Systolic function was mildly to moderately  reduced. - Right atrium: The atrium was mildly dilated. -  Pulmonary arteries: PA peak pressure: 47 mm Hg (S).    Consults   GI   Medications  Scheduled Meds: . amLODipine  10 mg Oral Daily  . azithromycin  500 mg Intravenous Q24H  . budesonide  0.25 mg Nebulization BID  . cefTRIAXone (ROCEPHIN)  IV  1 g Intravenous Q24H  . cholecalciferol  5,000 Units Oral Daily  . ferrous sulfate  325 mg Oral Q breakfast  . fluticasone  1 spray Each Nare Daily  . furosemide  40 mg  Oral Daily  . ipratropium-albuterol  3 mL Nebulization Q6H  . isosorbide-hydrALAZINE  1 tablet Oral BID  . lactose free nutrition  237 mL Oral TID WC  . levothyroxine  112 mcg Oral QAC breakfast  . methylPREDNISolone (SOLU-MEDROL) injection  40 mg Intravenous Q24H  . multivitamin with minerals  1 tablet Oral Daily  . pantoprazole (PROTONIX) IV  40 mg Intravenous Q12H  . pravastatin  20 mg Oral q1800  . sodium chloride flush  3 mL Intravenous Q12H   Continuous Infusions: . sodium chloride     PRN Meds:.acetaminophen **OR** acetaminophen, albuterol, ALPRAZolam, docusate sodium, guaiFENesin, HYDROcodone-acetaminophen, naphazoline-glycerin, nitroGLYCERIN, promethazine  DVT Prophylaxis   SCDs   Lab Results  Component Value Date   PLT 488* 12/11/2015    Antibiotics   Anti-infectives    Start     Dose/Rate Route Frequency Ordered Stop   12/08/15 1430  azithromycin (ZITHROMAX) 500 mg in dextrose 5 % 250 mL IVPB     500 mg 250 mL/hr over 60 Minutes Intravenous Every 24 hours 12/07/15 1629 12/14/15 1429   12/08/15 1400  cefTRIAXone (ROCEPHIN) 1 g in dextrose 5 % 50 mL IVPB     1 g 100 mL/hr over 30 Minutes Intravenous Every 24 hours 12/07/15 1629 12/14/15 1359   12/07/15 1330  cefTRIAXone (ROCEPHIN) 1 g in dextrose 5 % 50 mL IVPB     1 g 100 mL/hr over 30 Minutes Intravenous  Once 12/07/15 1317 12/07/15 1415   12/07/15 1330  azithromycin (ZITHROMAX) 500 mg in dextrose 5 % 250 mL IVPB     500 mg 250 mL/hr over 60 Minutes Intravenous  Once 12/07/15 1317 12/07/15 1524          Objective:   Filed Vitals:   12/11/15 0143 12/11/15 0453 12/11/15 1032 12/11/15 1433  BP:  153/97  154/57  Pulse:  48  75  Temp:  97.9 F (36.6 C)  97.7 F (36.5 C)  TempSrc:  Oral  Oral  Resp:  20  20  Height:      Weight:      SpO2: 97% 97% 96% 95%    Wt Readings from Last 3 Encounters:  12/10/15 92.534 kg (204 lb)  12/02/15 89.359 kg (197 lb)  10/30/15 91.082 kg (200 lb 12.8 oz)      Intake/Output Summary (Last 24 hours) at 12/11/15 1459 Last data filed at 12/11/15 1000  Gross per 24 hour  Intake    480 ml  Output   1350 ml  Net   -870 ml     Physical Exam  Awake Alert, Oriented X 3,  Supple Neck,No JVD,  Symmetrical Chest wall movement, Good air movement bilaterally, no wheezing IIRR,No Gallops,Rubs or new Murmurs, No Parasternal Heave +ve B.Sounds, Abd Soft, No tenderness, No organomegaly appriciated, No rebound, guarding or rigidity. No Cyanosis, Clubbing or edema, No new Rash or bruise     Data Review   Micro Results Recent Results (from the  past 240 hour(s))  Culture, blood (routine x 2) Call MD if unable to obtain prior to antibiotics being given     Status: None (Preliminary result)   Collection Time: 12/07/15  5:00 PM  Result Value Ref Range Status   Specimen Description BLOOD  Final   Special Requests NONE  Final   Culture   Final    NO GROWTH 4 DAYS Performed at [facility]    Report Status PENDING  Incomplete  Culture, blood (routine x 2) Call MD if unable to obtain prior to antibiotics being given     Status: None (Preliminary result)   Collection Time: 12/07/15  5:09 PM  Result Value Ref Range Status   Specimen Description BLOOD  Final   Special Requests NONE  Final   Culture   Final    NO GROWTH 4 DAYS Performed at [facility]    Report Status PENDING  Incomplete  Culture, sputum-assessment     Status: None   Collection Time: 12/07/15  6:00 PM  Result Value Ref Range Status   Specimen Description SPUTUM  Final   Special Requests NONE  Final   Sputum evaluation   Final    THIS SPECIMEN IS ACCEPTABLE. RESPIRATORY CULTURE REPORT TO FOLLOW.   Report Status 12/07/2015 FINAL  Final  Culture, respiratory (NON-Expectorated)     Status: None   Collection Time: 12/07/15  6:00 PM  Result Value Ref Range Status   Specimen Description SPUTUM  Final   Special Requests NONE  Final   Gram Stain   Final    ABUNDANT WBC  PRESENT, PREDOMINANTLY PMN FEW SQUAMOUS EPITHELIAL CELLS PRESENT MODERATE GRAM POSITIVE COCCI IN PAIRS RARE GRAM VARIABLE ROD Performed at [facility]    Culture   Final    MODERATE ENTEROBACTER ASBURIAE Culture reincubated for better growth Performed at [facility]    Report Status 12/10/2015 FINAL  Final    Radiology Reports Dg Chest 2 View  12/07/2015  CLINICAL DATA:  Cough. Weakness. Fever. Shortness of breath COPD. Asthma. Coronary artery disease. EXAM: CHEST  2 VIEW COMPARISON:  12/25/2014 FINDINGS: Prior median sternotomy. Midline trachea. Moderate cardiomegaly. Mild right hemidiaphragm elevation. No pleural effusion or pneumothorax. Diffuse peribronchial thickening. Left upper lobe patchy airspace disease. IMPRESSION: Left upper lobe airspace disease, most consistent with pneumonia. Followup PA and lateral chest X-ray is recommended in 3-4 weeks following trial of antibiotic therapy to ensure resolution and exclude underlying malignancy. Underlying cardiomegaly with chronic interstitial thickening, likely related to COPD/ chronic bronchitis. Electronically Signed   By: Abigail Miyamoto M.D.   On: 12/07/2015 11:40     CBC  Recent Labs Lab 12/07/15 1056 12/07/15 2009 12/08/15 0156 12/09/15 0419 12/10/15 0444 12/11/15 0418  WBC 30.4*  --  21.3* 14.1* 9.9 18.2*  HGB 9.5* 9.2* 8.5* 8.2* 8.3* 7.7*  HCT 28.8* 29.0* 26.3* 25.7* 26.0* 23.6*  PLT 418*  --  398 425* 458* 488*  MCV 98.3  --  98.1 97.7 96.3 96.3  MCH 32.4  --  31.7 31.2 30.7 31.4  MCHC 33.0  --  32.3 31.9 31.9 32.6  RDW 13.7  --  13.9 14.1 13.8 13.9    Chemistries   Recent Labs Lab 12/07/15 1056 12/08/15 0155 12/08/15 0156 12/09/15 0419 12/10/15 0444 12/11/15 0418  NA 141  --  142 143 138 138  K 4.6  --  4.5 4.1 4.6 4.4  CL 106  --  109 110 105 104  CO2  21*  --  22 22 21* 23  GLUCOSE 117*  --  119* 111* 187* 139*  BUN 35*  --  36* 35* 38* 46*  CREATININE 1.85*  --  1.72* 1.63* 1.65* 1.73*   CALCIUM 8.8*  --  8.4* 8.4* 8.5* 8.9  MG  --  2.3  --   --   --   --    ------------------------------------------------------------------------------------------------------------------ estimated creatinine clearance is 41.5 mL/min (by C-G formula based on Cr of 1.73). ------------------------------------------------------------------------------------------------------------------ No results for input(s): HGBA1C in the last 72 hours. ------------------------------------------------------------------------------------------------------------------ No results for input(s): CHOL, HDL, LDLCALC, TRIG, CHOLHDL, LDLDIRECT in the last 72 hours. ------------------------------------------------------------------------------------------------------------------ No results for input(s): TSH, T4TOTAL, T3FREE, THYROIDAB in the last 72 hours.  Invalid input(s): FREET3 ------------------------------------------------------------------------------------------------------------------  Recent Labs  12/09/15 0419  VITAMINB12 1233*  FERRITIN 78  TIBC 293  IRON 12*    Coagulation profile  Recent Labs Lab 12/07/15 1052 12/08/15 0754 12/09/15 0419  INR 4.48* 1.64* 1.46    No results for input(s): DDIMER in the last 72 hours.  Cardiac Enzymes  Recent Labs Lab 12/07/15 1515 12/07/15 2009 12/08/15 0153  TROPONINI 0.53* 0.52* 0.53*   ------------------------------------------------------------------------------------------------------------------ Invalid input(s): POCBNP     Time Spent in minutes   35 minutes   Alyssamae Klinck M.D on 12/11/2015 at 2:59 PM  Pager -   [email or web address] - password [email or web address]  [facility]   Office  [phone]

## 2015-12-12 ENCOUNTER — Inpatient Hospital Stay (HOSPITAL_COMMUNITY): Payer: Medicare Other

## 2015-12-12 ENCOUNTER — Encounter (HOSPITAL_COMMUNITY)
Admission: EM | Disposition: A | Discharge status: Home / Self Care | Payer: Self-pay | Source: Home / Self Care | Attending: Internal Medicine

## 2015-12-12 ENCOUNTER — Encounter (HOSPITAL_COMMUNITY): Payer: Self-pay

## 2015-12-12 DIAGNOSIS — D520 Dietary folate deficiency anemia: Secondary | ICD-10-CM

## 2015-12-12 HISTORY — PX: ESOPHAGOGASTRODUODENOSCOPY: SHX5428

## 2015-12-12 LAB — BASIC METABOLIC PANEL
Anion gap: 12 (ref 5–15)
BUN: 49 mg/dL — AB (ref 6–20)
CO2: 25 mmol/L (ref 22–32)
CREATININE: 1.56 mg/dL — AB (ref 0.61–1.24)
Calcium: 8.8 mg/dL — ABNORMAL LOW (ref 8.9–10.3)
Chloride: 103 mmol/L (ref 101–111)
GFR, EST AFRICAN AMERICAN: 48 mL/min — AB (ref >60)
GFR, EST NON AFRICAN AMERICAN: 41 mL/min — AB (ref >60)
Glucose, Bld: 133 mg/dL — ABNORMAL HIGH (ref 65–99)
POTASSIUM: 4.3 mmol/L (ref 3.5–5.1)
SODIUM: 140 mmol/L (ref 135–145)

## 2015-12-12 LAB — CULTURE, BLOOD (ROUTINE X 2)
Culture: NO GROWTH
Culture: NO GROWTH

## 2015-12-12 LAB — CBC
HCT: 23.4 % — ABNORMAL LOW (ref 39.0–52.0)
Hemoglobin: 7.6 g/dL — ABNORMAL LOW (ref 13.0–17.0)
MCH: 31.3 pg (ref 26.0–34.0)
MCHC: 32.5 g/dL (ref 30.0–36.0)
MCV: 96.3 fL (ref 78.0–100.0)
PLATELETS: 509 10*3/uL — AB (ref 150–400)
RBC: 2.43 MIL/uL — AB (ref 4.22–5.81)
RDW: 14 % (ref 11.5–15.5)
WBC: 17.2 10*3/uL — ABNORMAL HIGH (ref 4.0–10.5)

## 2015-12-12 LAB — ABO/RH: ABO/RH(D): A POS

## 2015-12-12 LAB — PREPARE RBC (CROSSMATCH)

## 2015-12-12 SURGERY — EGD (ESOPHAGOGASTRODUODENOSCOPY)
Anesthesia: Moderate Sedation

## 2015-12-12 MED ORDER — WARFARIN SODIUM 2.5 MG PO TABS
2.5000 mg | ORAL_TABLET | Freq: Once | ORAL | Status: AC
  Administered 2015-12-12: 2.5 mg via ORAL
  Filled 2015-12-12: qty 1

## 2015-12-12 MED ORDER — DIPHENHYDRAMINE HCL 50 MG/ML IJ SOLN
INTRAMUSCULAR | Status: AC
  Filled 2015-12-12: qty 1

## 2015-12-12 MED ORDER — BUTAMBEN-TETRACAINE-BENZOCAINE 2-2-14 % EX AERO
INHALATION_SPRAY | CUTANEOUS | Status: DC | PRN
  Administered 2015-12-12: 1 via TOPICAL

## 2015-12-12 MED ORDER — PANTOPRAZOLE SODIUM 40 MG PO TBEC
40.0000 mg | DELAYED_RELEASE_TABLET | Freq: Two times a day (BID) | ORAL | Status: DC
  Administered 2015-12-12: 40 mg via ORAL
  Filled 2015-12-12: qty 1

## 2015-12-12 MED ORDER — SUCRALFATE 1 GM/10ML PO SUSP
1.0000 g | Freq: Three times a day (TID) | ORAL | Status: DC
  Administered 2015-12-12 – 2015-12-13 (×4): 1 g via ORAL
  Filled 2015-12-12 (×4): qty 10

## 2015-12-12 MED ORDER — WARFARIN - PHARMACIST DOSING INPATIENT: Freq: Every day | Status: DC

## 2015-12-12 MED ORDER — MIDAZOLAM HCL 10 MG/2ML IJ SOLN
INTRAMUSCULAR | Status: DC | PRN
  Administered 2015-12-12 (×4): 1 mg via INTRAVENOUS

## 2015-12-12 MED ORDER — FENTANYL CITRATE (PF) 100 MCG/2ML IJ SOLN
INTRAMUSCULAR | Status: AC
  Filled 2015-12-12: qty 2

## 2015-12-12 MED ORDER — FUROSEMIDE 10 MG/ML IJ SOLN
20.0000 mg | Freq: Once | INTRAMUSCULAR | Status: AC
Start: 2015-12-12 — End: 2015-12-12
  Administered 2015-12-12: 20 mg via INTRAVENOUS
  Filled 2015-12-12 (×2): qty 2

## 2015-12-12 MED ORDER — PREDNISONE 5 MG PO TABS
10.0000 mg | ORAL_TABLET | Freq: Every day | ORAL | Status: AC
  Administered 2015-12-13: 10 mg via ORAL
  Filled 2015-12-12: qty 2

## 2015-12-12 MED ORDER — SODIUM CHLORIDE 0.9 % IV SOLN
Freq: Once | INTRAVENOUS | Status: AC
  Administered 2015-12-12: 22:00:00 via INTRAVENOUS

## 2015-12-12 MED ORDER — MIDAZOLAM HCL 5 MG/ML IJ SOLN
INTRAMUSCULAR | Status: AC
  Filled 2015-12-12: qty 2

## 2015-12-12 MED ORDER — FENTANYL CITRATE (PF) 100 MCG/2ML IJ SOLN
INTRAMUSCULAR | Status: DC | PRN
  Administered 2015-12-12 (×2): 12.5 ug via INTRAVENOUS

## 2015-12-12 NOTE — Progress Notes (Signed)
PT Cancellation Note  Patient Details Name: TORANCE PEREIDA MRN: [id number] DOB: 1941/06/12   Cancelled Treatment:    Reason Eval/Treat Not Completed: Patient at procedure or test/unavailable. Will check back another day/time. Thanks.    Weston Anna, MPT Pager: [phone]

## 2015-12-12 NOTE — Progress Notes (Signed)
ANTICOAGULATION CONSULT NOTE - Initial Consult  Pharmacy Consult for Warfarin Indication: atrial fibrillation  No Known Allergies  Patient Measurements: Height: 5\' 10"  (177.8 cm) Weight: 203 lb 0.7 oz (92.1 kg) IBW/kg (Calculated) : 73  Vital Signs: Temp: 98.3 F (36.8 C) (03/10 1408) Temp Source: Oral (03/10 1408) BP: 143/93 mmHg (03/10 1408) Pulse Rate: 105 (03/10 1408)  Labs:  Recent Labs  12/10/15 0444 12/11/15 0418 12/11/15 1522 12/12/15 0407  HGB 8.3* 7.7* 7.9* 7.6*  HCT 26.0* 23.6* 23.6* 23.4*  PLT 458* 488* 524* 509*  CREATININE 1.65* 1.73*  --  1.56*    Estimated Creatinine Clearance: 45.9 mL/min (by C-G formula based on Cr of 1.56).   Medical History: Past Medical History  Diagnosis Date  . Unspecified essential hypertension   . Anemia   . Asthma   . Hepatomegaly   . Esophageal reflux   . Other and unspecified coagulation defects   . Hiatal hernia   . Personal history of colonic polyps 05/29/2010    TUBULAR ADENOMA  . CAD (coronary artery disease)   . Hypothyroidism   . Gout   . Hyperlipidemia   . Gastric antral vascular ectasia 2013  . Atrial fibrillation ([place])   . History of blood transfusion ~ 2012    "blood count dropped; had to get 3 units"  . Arthritis     "hips; back" (07/09/2014)  . COPD (chronic obstructive pulmonary disease) (HCC)     on home oxygen, 2 liters at night10/2015  . Gallstones   . 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   . Cholecystitis 11/2013    Medications:  Scheduled:  . sodium chloride   Intravenous Once  . amLODipine  10 mg Oral Daily  . azithromycin  500 mg Intravenous Q24H  . budesonide  0.25 mg Nebulization BID  . cefTRIAXone (ROCEPHIN)  IV  1 g Intravenous Q24H  . cholecalciferol  5,000 Units Oral Daily  . ferrous sulfate  325 mg Oral Q breakfast  . fluticasone  1 spray Each Nare Daily  . furosemide  20 mg Intravenous Once  . furosemide  40 mg Oral Daily  . ipratropium-albuterol  3 mL  Nebulization Q6H  . isosorbide-hydrALAZINE  1 tablet Oral BID  . lactose free nutrition  237 mL Oral TID WC  . levothyroxine  112 mcg Oral QAC breakfast  . multivitamin with minerals  1 tablet Oral Daily  . pravastatin  20 mg Oral q1800  . [START ON 12/13/2015] predniSONE  10 mg Oral Q breakfast  . sodium chloride flush  3 mL Intravenous Q12H  . sucralfate  1 g Oral TID WC & HS  . warfarin  2.5 mg Oral ONCE-1800  . Warfarin - Pharmacist Dosing Inpatient   Does not apply q1800   Infusions:   PRN: acetaminophen **OR** acetaminophen, albuterol, ALPRAZolam, docusate sodium, guaiFENesin, HYDROcodone-acetaminophen, naphazoline-glycerin, nitroGLYCERIN, promethazine  Assessment: 75 y.o. male, has a history of atrial fibrillation on chronic warfarin, presents on 3/5 with SHoB and cough, CXR with evidence of pneumonia, as well workup significant for acute renal failure and elevated troponin , anemia with Hemoccult-positive stool with supratherapeutic INR (4.48), admitted for further workup.  GIHe was given vitamin K and warfarin has been on hold since. GI was consulted - EGD shows AVMs without bleeding. Today, 3/10, Pharmacy is consulted to resume warfarin dosing.   Home dosage reported as 5mg  MWF and Sat, 2.5mg  Thurs and Sun  Last INR was 1.46 on 3/7,  likely normalized today  Hgb remains low at 7.6, no active bleeding reported  Drug interaction potential with azithromycin - begin warfarin dosing conservatively  Goal of Therapy:  INR 2-3   Plan:   Warfarin 2.5mg  PO x 1 today  Daily PT/INR  Monitor closely for signs of bleeding  Peggyann Juba, PharmD, BCPS Pager: [phone] 12/12/2015,4:50 PM

## 2015-12-12 NOTE — Progress Notes (Addendum)
Patient Demographics  [name] [name], is a 75 y.o. male, DOB - 07-08-1941, [id number]  Admit date - 12/07/2015   Admitting Physician Albertine Patricia, MD  Outpatient Primary MD for the patient is Claretta Fraise, MD  LOS - 5   Chief Complaint  Patient presents with  . Shortness of Breath       Admission HPI/Brief narrative: 75 y.o. male, has a history of atrial fibrillation and heart disease ,gastroesophageal reflux disease hypertension hyperlipidemia , presents with complaints of cough and shortness of breath , chest x-ray with evidence of pneumonia, as well workup significant for acute renal failure and elevated troponin , anemia with Hemoccult-positive stool with supratherapeutic INR, admitted for further workup.  Subjective:   [name] [name] has a mild cough still- mild dyspnea on exertion- no nausea, vomiting, diarrhea.   Assessment & Plan   Sepsis secondary to pneumonia/ Acute on chronic hypoxic respiratory failure - Patient presents with leukocytosis, tachypnea and tachycardia, secondary to community-acquired pneumonia - sputum culture growing Enterobacter Asburiae- sensitivities pending- per ID PCN, Cephalosporins and Levaquin would all provide adequate coverage - blood culture no growth to date, strep pneumoniae antigen nonreactive,  - Continue with Rocephin - d/c azithromycin today (5 day course) - leukocytosis resolved - has been weaned down to 2 L - will need repeat CXR in 3-4 wks  COPD exacerbation - will continue with Pulmicort, DuoNeb every 6 hours and when necessary albuterol - weaned off Solumedrol to Prednisone    Anemia/ GI bleed - Hb 1.5 on 10/21/15 and now down to 7.6- transfuse 1 U PRBC  - Iron saturaion low and Ferretin normal (possibly higher than his usual due to it being an acute phase reactant?) - on daily Iron at home - Hemoccult positive- GI consulted-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 Fib/ Chronic anticoagulation with supratherapeutic INR - Heart rate controlled without rate controlling agent- having some bradycardic episodes - INR 4/48 on admission-reversed with Vit K-  hold warfarin for GI bleed  Hypertension - Continue with home medication- Norvasc, Bidil  Acute on chronic kidney disease stage III - Prerenal - improved with hydration   Code Status: Full  Family Communication: D/W patient  Disposition Plan: home tomorrow   Procedures  2 D ECHO Study Conclusions  - Left ventricle: The cavity size was severely dilated. Wall  thickness was normal. Systolic function was severely reduced. The  estimated ejection fraction was in the range of 25% to 30%. - Mitral valve: There was mild to moderate regurgitation. - Left atrium: The atrium was moderately dilated. - Right ventricle: Systolic function was mildly to moderately  reduced. - Right atrium: The atrium was mildly dilated. - Pulmonary arteries: PA peak pressure: 47 mm Hg (S).  EGD  Consults   GI, cardiology  Medications  Scheduled Meds: . amLODipine  10 mg Oral Daily  . azithromycin  500 mg Intravenous Q24H  . budesonide  0.25 mg Nebulization BID  . cefTRIAXone (ROCEPHIN)  IV  1 g Intravenous Q24H  . cholecalciferol  5,000 Units Oral Daily  . ferrous sulfate  325 mg Oral Q breakfast  . fluticasone  1 spray Each Nare Daily  . furosemide  40 mg Oral Daily  . ipratropium-albuterol  3 mL Nebulization Q6H  . isosorbide-hydrALAZINE  1 tablet  Oral BID  . lactose free nutrition  237 mL Oral TID WC  . levothyroxine  112 mcg Oral QAC breakfast  . multivitamin with minerals  1 tablet Oral Daily  . pantoprazole  40 mg Oral BID AC  . pravastatin  20 mg Oral q1800  . predniSONE  20 mg Oral Q breakfast  . sodium chloride flush  3 mL Intravenous Q12H  . sucralfate  1 g Oral TID WC & HS   Continuous Infusions:   PRN Meds:.acetaminophen **OR** acetaminophen, albuterol, ALPRAZolam, docusate sodium, guaiFENesin, HYDROcodone-acetaminophen, naphazoline-glycerin, nitroGLYCERIN, promethazine  DVT Prophylaxis   SCDs   Lab Results  Component Value Date   PLT 509* 12/12/2015    Antibiotics   Anti-infectives    Start     Dose/Rate Route Frequency Ordered Stop   12/12/15 1430  azithromycin (ZITHROMAX) 500 mg in dextrose 5 % 250 mL IVPB     500 mg 250 mL/hr over 60 Minutes Intravenous Every 24 hours 12/11/15 1514 12/13/15 1429   12/08/15 1430  azithromycin (ZITHROMAX) 500 mg in dextrose 5 % 250 mL IVPB  Status:  Discontinued     500 mg 250 mL/hr over 60 Minutes Intravenous Every 24 hours 12/07/15 1629 12/11/15 1514   12/08/15 1400  cefTRIAXone (ROCEPHIN) 1 g in dextrose 5 % 50 mL IVPB     1 g 100 mL/hr over 30 Minutes Intravenous Every 24 hours 12/07/15 1629 12/14/15 1359   12/07/15 1330  cefTRIAXone (ROCEPHIN) 1 g in dextrose 5 % 50 mL IVPB     1 g 100 mL/hr over 30 Minutes Intravenous  Once 12/07/15 1317 12/07/15 1415   12/07/15 1330  azithromycin (ZITHROMAX) 500 mg in dextrose 5 % 250 mL IVPB     500 mg 250 mL/hr over 60 Minutes Intravenous  Once 12/07/15 1317 12/07/15 1524          Objective:   Filed Vitals:   12/12/15 1310 12/12/15 1320 12/12/15 1408 12/12/15 1427  BP: 163/84  143/93   Pulse: 37 50 105   Temp:   98.3 F (36.8 C)   TempSrc:   Oral   Resp: 18 18 19    Height:      Weight:      SpO2: 92% 92% 93% 94%    Wt Readings from Last 3 Encounters:  12/12/15 92.1 kg (203 lb 0.7 oz)  12/02/15 89.359 kg (197  lb)  10/30/15 91.082 kg (200 lb 12.8 oz)     Intake/Output Summary (Last 24 hours) at 12/12/15 1632 Last data filed at 12/12/15 1227  Gross per 24 hour  Intake 439.67 ml  Output    600 ml  Net -160.33 ml     Physical Exam  Awake Alert, Oriented X 3,  Supple Neck,No JVD,  Symmetrical Chest wall movement, Good air movement bilaterally, no wheezing IIRR,No Gallops,Rubs or new Murmurs, No Parasternal Heave +ve B.Sounds, Abd Soft, No tenderness, No organomegaly appriciated, No rebound, guarding or rigidity. No Cyanosis, Clubbing or  edema, No new Rash or bruise     Data Review   Micro Results Recent Results (from the past 240 hour(s))  Culture, blood (routine x 2) Call MD if unable to obtain prior to antibiotics being given     Status: None   Collection Time: 12/07/15  5:00 PM  Result Value Ref Range Status   Specimen Description BLOOD  Final   Special Requests NONE  Final   Culture   Final    NO GROWTH 5 DAYS Performed at [facility]    Report Status 12/12/2015 FINAL  Final  Culture, blood (routine x 2) Call MD if unable to obtain prior to antibiotics being given     Status: None   Collection Time: 12/07/15  5:09 PM  Result Value Ref Range Status   Specimen Description BLOOD  Final   Special Requests NONE  Final   Culture   Final    NO GROWTH 5 DAYS Performed at [facility]    Report Status 12/12/2015 FINAL  Final  Culture, sputum-assessment     Status: None   Collection Time: 12/07/15  6:00 PM  Result Value Ref Range Status   Specimen Description SPUTUM  Final   Special Requests NONE  Final   Sputum evaluation   Final    THIS SPECIMEN IS ACCEPTABLE. RESPIRATORY CULTURE REPORT TO FOLLOW.   Report Status 12/07/2015 FINAL  Final  Culture, respiratory (NON-Expectorated)     Status: None   Collection Time: 12/07/15  6:00 PM  Result Value Ref Range Status   Specimen Description SPUTUM  Final   Special Requests NONE  Final   Gram Stain   Final     ABUNDANT WBC PRESENT, PREDOMINANTLY PMN FEW SQUAMOUS EPITHELIAL CELLS PRESENT MODERATE GRAM POSITIVE COCCI IN PAIRS RARE GRAM VARIABLE ROD Performed at [facility]    Culture   Final    MODERATE ENTEROBACTER ASBURIAE Culture reincubated for better growth Performed at [facility]    Report Status 12/10/2015 FINAL  Final    Radiology Reports Dg Chest 2 View  12/07/2015  CLINICAL DATA:  Cough. Weakness. Fever. Shortness of breath COPD. Asthma. Coronary artery disease. EXAM: CHEST  2 VIEW COMPARISON:  12/25/2014 FINDINGS: Prior median sternotomy. Midline trachea. Moderate cardiomegaly. Mild right hemidiaphragm elevation. No pleural effusion or pneumothorax. Diffuse peribronchial thickening. Left upper lobe patchy airspace disease. IMPRESSION: Left upper lobe airspace disease, most consistent with pneumonia. Followup PA and lateral chest X-ray is recommended in 3-4 weeks following trial of antibiotic therapy to ensure resolution and exclude underlying malignancy. Underlying cardiomegaly with chronic interstitial thickening, likely related to COPD/ chronic bronchitis. Electronically Signed   By: Abigail Miyamoto M.D.   On: 12/07/2015 11:40     CBC  Recent Labs Lab 12/09/15 0419 12/10/15 0444 12/11/15 0418 12/11/15 1522 12/12/15 0407  WBC 14.1* 9.9 18.2* 16.9* 17.2*  HGB 8.2* 8.3* 7.7* 7.9* 7.6*  HCT 25.7* 26.0* 23.6* 23.6* 23.4*  PLT 425* 458* 488* 524* 509*  MCV 97.7 96.3 96.3 91.8 96.3  MCH 31.2 30.7 31.4 30.7 31.3  MCHC 31.9 31.9 32.6 33.5 32.5  RDW 14.1 13.8 13.9 13.7 14.0    Chemistries   Recent Labs Lab 12/08/15 0155 12/08/15 0156 12/09/15 0419 12/10/15 0444 12/11/15 0418 12/12/15 0407  NA  --  142 143 138 138 140  K  --  4.5 4.1 4.6 4.4 4.3  CL  --  109 110 105 104 103  CO2  --  22  22 21* 23 25  GLUCOSE  --  119* 111* 187* 139* 133*  BUN  --  36* 35* 38* 46* 49*  CREATININE  --  1.72* 1.63* 1.65* 1.73* 1.56*  CALCIUM  --  8.4* 8.4* 8.5* 8.9 8.8*    MG 2.3  --   --   --   --   --    ------------------------------------------------------------------------------------------------------------------ estimated creatinine clearance is 45.9 mL/min (by C-G formula based on Cr of 1.56). ------------------------------------------------------------------------------------------------------------------ No results for input(s): HGBA1C in the last 72 hours. ------------------------------------------------------------------------------------------------------------------ No results for input(s): CHOL, HDL, LDLCALC, TRIG, CHOLHDL, LDLDIRECT in the last 72 hours. ------------------------------------------------------------------------------------------------------------------ No results for input(s): TSH, T4TOTAL, T3FREE, THYROIDAB in the last 72 hours.  Invalid input(s): FREET3 ------------------------------------------------------------------------------------------------------------------ No results for input(s): VITAMINB12, FOLATE, FERRITIN, TIBC, IRON, RETICCTPCT in the last 72 hours.  Coagulation profile  Recent Labs Lab 12/07/15 1052 12/08/15 0754 12/09/15 0419  INR 4.48* 1.64* 1.46    No results for input(s): DDIMER in the last 72 hours.  Cardiac Enzymes  Recent Labs Lab 12/07/15 1515 12/07/15 2009 12/08/15 0153  TROPONINI 0.53* 0.52* 0.53*   ------------------------------------------------------------------------------------------------------------------ Invalid input(s): POCBNP     Time Spent in minutes   35 minutes   Sten Dematteo M.D on 12/12/2015 at [place] PM  Pager -   [email or web address] - password [facility]  [facility]   Office  [phone]

## 2015-12-12 NOTE — Op Note (Addendum)
[facility] Patient Name: [name] [name] Procedure Date: 12/12/2015 MRN: [id number] Attending MD: Gatha Mayer , MD Date of Birth: 11/12/1940 CSN:  Age: 75 Admit Type: Inpatient Account #: [id number] Procedure:                Upper GI endoscopy Indications:              Heme positive stool, Suspected upper                            gastrointestinal bleeding, Watermelon stomach (GAVE                            syndrome) Providers:                Gatha Mayer, MD, Laverta Baltimore, RN, Cherylynn Ridges, Technician Referring MD:              Medicines:                Midazolam 4 mg IV, Fentanyl 50 micrograms IV Complications:            No immediate complications. Estimated Blood Loss:     Estimated blood loss: none. Procedure:                Pre-Anesthesia Assessment:                           - Prior to the procedure, a History and Physical                            was performed, and patient medications and                            allergies were reviewed. The patient's tolerance of                            previous anesthesia was also reviewed. The risks                            and benefits of the procedure and the sedation                            options and risks were discussed with the patient.                            All questions were answered, and informed consent                            was obtained. Prior Anticoagulants: The patient                            last took Coumadin (warfarin) 7 days prior to the  procedure. ASA Grade Assessment: III - A patient                            with severe systemic disease. After reviewing the                            risks and benefits, the patient was deemed in                            satisfactory condition to undergo the procedure.                           After obtaining informed consent, the endoscope was   passed under direct vision. Throughout the                            procedure, the patient's blood pressure, pulse, and                            oxygen saturations were monitored continuously. The                            Endoscope was introduced through the mouth, and                            advanced to the second part of duodenum. The upper                            GI endoscopy was accomplished without difficulty.                            The patient tolerated the procedure well. Scope In: Scope Out: Findings:      Moderate gastric antral vascular ectasia (suspect some gastritis       compoent also)without bleeding was present in the gastric antrum.       Fulguration to ablate the lesion to prevent bleeding [Cautery unit] was       successful. Estimated blood loss: none.      The exam was otherwise without abnormality.      The cardia and gastric fundus were normal on retroflexion. Impression:               - Gastric antral vascular ectasia without bleeding.                            Associated gastritis also. Ablated w/ APC                           - The examination was otherwise normal.                           - No specimens collected. Moderate Sedation:      Moderate (conscious) sedation was administered by the endoscopy nurse       and supervised by the endoscopist. The following parameters were       monitored: oxygen saturation, heart rate, blood  pressure, respiratory       rate, EKG, adequacy of pulmonary ventilation, and response to care.       Total physician intraservice time was 10 minutes. Recommendation:           - Return patient to hospital ward for ongoing care.                           - Resume previous diet.                           - Continue present medications. Procedure Code(s):        --- Professional ---                           [phone], Esophagogastroduodenoscopy, flexible,                            transoral; with control of bleeding, any  method                           G0500, Moderate sedation services provided by the                            same physician or other qualified health care                            professional performing a gastrointestinal                            endoscopic service that sedation supports,                            requiring the presence of an independent trained                            observer to assist in the monitoring of the                            patient's level of consciousness and physiological                            status; initial 15 minutes of intra-service time;                            patient age 32 years or older (additional time may                            be reported with [phone], as appropriate) Diagnosis Code(s):        --- Professional ---                           K31.819, Angiodysplasia of stomach and duodenum                            without bleeding  R19.5, Other fecal abnormalities CPT copyright 2016 American Medical Association. All rights reserved. The codes documented in this report are preliminary and upon coder review may  be revised to meet current compliance requirements. Gatha Mayer, MD Gatha Mayer, MD 12/12/2015 12:54:27 PM Number of Addenda: 1 Addendum Number: 1   Addendum Date: 12/12/2015 12:55:27 PM      Correction: sedation was 25 ug Fentanyl not 50 Gatha Mayer, MD Gatha Mayer, MD 12/12/2015 12:55:46 PM

## 2015-12-12 NOTE — Progress Notes (Signed)
Patient Name: [name] [name] Date of Encounter: 12/12/2015     Active Problems:   Iron deficiency anemia   Essential hypertension   COPD (chronic obstructive pulmonary disease) with emphysema (HCC)   Chronic systolic CHF (congestive heart failure) (HCC)   Anticoagulant long-term use   Atrial fibrillation, chronic (HCC)   CAP (community acquired pneumonia)   Pneumathemia ([place])   Acute renal failure superimposed on stage 3 chronic kidney disease (HCC)   Heme + stool   Decreased hemoglobin   GAVE (gastric antral vascular ectasia)   Elevated troponin   Absolute anemia   Acute systolic heart failure ([place])    SUBJECTIVE  Getting a breathing treatment. No complaints. Feeling better after IV lasix. EGD today  CURRENT MEDS . amLODipine  10 mg Oral Daily  . azithromycin  500 mg Intravenous Q24H  . budesonide  0.25 mg Nebulization BID  . cefTRIAXone (ROCEPHIN)  IV  1 g Intravenous Q24H  . cholecalciferol  5,000 Units Oral Daily  . ferrous sulfate  325 mg Oral Q breakfast  . fluticasone  1 spray Each Nare Daily  . furosemide  40 mg Oral Daily  . ipratropium-albuterol  3 mL Nebulization Q6H  . isosorbide-hydrALAZINE  1 tablet Oral BID  . lactose free nutrition  237 mL Oral TID WC  . levothyroxine  112 mcg Oral QAC breakfast  . multivitamin with minerals  1 tablet Oral Daily  . pantoprazole (PROTONIX) IV  40 mg Intravenous Q12H  . pravastatin  20 mg Oral q1800  . predniSONE  20 mg Oral Q breakfast  . sodium chloride flush  3 mL Intravenous Q12H    OBJECTIVE  Filed Vitals:   12/11/15 1524 12/11/15 2050 12/11/15 2229 12/12/15 0603  BP:   156/65 140/47  Pulse:   60 52  Temp:   98 F (36.7 C) 97.8 F (36.6 C)  TempSrc:   Oral Oral  Resp:   20 20  Height:      Weight:    203 lb 0.7 oz (92.1 kg)  SpO2: 99% 98% 97% 94%    Intake/Output Summary (Last 24 hours) at 12/12/15 0747 Last data filed at 12/11/15 2229  Gross per 24 hour  Intake 879.67 ml  Output   1200 ml    Net -320.33 ml   Filed Weights   12/09/15 0403 12/10/15 0645 12/12/15 0603  Weight: 198 lb 4.8 oz (89.948 kg) 204 lb (92.534 kg) 203 lb 0.7 oz (92.1 kg)    PHYSICAL EXAM  General: Pleasant, NAD. Getting breathing treatment Neuro: Alert and oriented X 3. Moves all extremities spontaneously. Psych: Normal affect. HEENT:  Normal  Neck: Supple without bruits or JVD. Lungs:  Resp regular and unlabored, decreased breath sounds at bases. Mild wheezes Heart: irreg irreg. no s3, s4, or murmurs. Abdomen: Soft, non-tender, non-distended, BS + x 4.  Extremities: No clubbing, cyanosis or edema. DP/PT/Radials 2+ and equal bilaterally.  Accessory Clinical Findings  CBC  Recent Labs  12/11/15 0418 12/11/15 1522  WBC 18.2* 16.9*  HGB 7.7* 7.9*  HCT 23.6* 23.6*  MCV 96.3 91.8  PLT 488* [id number]*   Basic Metabolic Panel  Recent Labs  12/11/15 0418 12/12/15 0407  NA 138 140  K 4.4 4.3  CL 104 103  CO2 23 25  GLUCOSE 139* 133*  BUN 46* 49*  CREATININE 1.73* 1.56*  CALCIUM 8.9 8.8*    TELE  afib with ventricular bigminy and freq PVCs noted on tele. HR in high 90s  Radiology/Studies  Dg Chest 2 View  12/07/2015  CLINICAL DATA:  Cough. Weakness. Fever. Shortness of breath COPD. Asthma. Coronary artery disease. EXAM: CHEST  2 VIEW COMPARISON:  12/25/2014 FINDINGS: Prior median sternotomy. Midline trachea. Moderate cardiomegaly. Mild right hemidiaphragm elevation. No pleural effusion or pneumothorax. Diffuse peribronchial thickening. Left upper lobe patchy airspace disease. IMPRESSION: Left upper lobe airspace disease, most consistent with pneumonia. Followup PA and lateral chest X-ray is recommended in 3-4 weeks following trial of antibiotic therapy to ensure resolution and exclude underlying malignancy. Underlying cardiomegaly with chronic interstitial thickening, likely related to COPD/ chronic bronchitis. Electronically Signed   By: Abigail Miyamoto M.D.   On: 12/07/2015 11:40     2D  ECHO : 12/08/2015 LV EF: 25% - 30% Study Conclusions - Left ventricle: The cavity size was severely dilated. Wall  thickness was normal. Systolic function was severely reduced. The  estimated ejection fraction was in the range of 25% to 30%. - Mitral valve: There was mild to moderate regurgitation. - Left atrium: The atrium was moderately dilated. - Right ventricle: Systolic function was mildly to moderately  reduced. - Right atrium: The atrium was mildly dilated. - Pulmonary arteries: PA peak pressure: 47 mm Hg (S). Impressions - Apical windows are foreshortened. Frequent PVCs throughout study  Make evaluation of function difficult.   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  Elevated Troponin: No chest pain or acute ECG changes. No true crescendo/decrescendo pattern. Has had worsening dyspnea on exertion over the recent past. Felt demand ischemia in the setting of sepsis and PNA. Plan for possible L/RHC most likely as an outpatient   CAD: h/o CABG + PCI/BMS to the SVB-RCA in 2015. Continue statin. No BB due to hx of pauses and no ASA with anemia and FOBT + stools.   Chronic systolic CHF: EF described at cath 07/2014 as 35%>>55% by echo 10/2014 and now 25-30% by echo. Most of dyspnea from active pneumonia BNP mildly elevated at 566 - With increasing weight, +I/O and increased O2 needs, he was started on IV lasix. Creat started to bump with IV diuresis and I converted him to PO lasix at 40mg  daily (home dose) yesterday - continue BiDil. On no BB due to hx of sinus pauses of AVN blocking agents.  - will need to evaluate for possible AICD with EF <35%  Pneumonia: Clinically this has been main issue with markedly abnormal exam, leukocytosis and CXR  LUL penumonia. This is on top of baseline nighttime oxygen dependant COPD.    [id number] 1.65--> 1.73--> 1.56  Afib:he has a history of  sinus pauses of AVN blocking agents, therefore on no rate control. His HR is in 90s currently.  - On chronic anticoagulation with coumadin for CHADS2VASC=4 - INR 4.48 on admit.  Coumadin reversed due to anemia Heparin when ok with GI. - since INR subtherapeutic, pt on SCDs  Anemia with Hemoccult-positive stool with supratherapeutic INR: coumadin reversed. H/H falling again. Hx gastric antral vasclar dysplasia. GI following and hopefully EGD today.   Ventricular ectopy: ventricular bigminy and freq PVCs noted on tele.   Judy Pimple PA-C  Pager [phone]  Cardiac status stable Breathing better Hopefully can get back on heparin and get dose of coumadin tonight After EGD.  Hopeing to ablate gastic AVM's per Dr Carlean Purl.  Pneumonia improving less dyspnea Continue rocephin and zithromycin  Jenkins Rouge

## 2015-12-13 DIAGNOSIS — J9601 Acute respiratory failure with hypoxia: Secondary | ICD-10-CM

## 2015-12-13 DIAGNOSIS — J438 Other emphysema: Secondary | ICD-10-CM

## 2015-12-13 DIAGNOSIS — J9600 Acute respiratory failure, unspecified whether with hypoxia or hypercapnia: Secondary | ICD-10-CM

## 2015-12-13 LAB — BASIC METABOLIC PANEL
Anion gap: 10 (ref 5–15)
BUN: 44 mg/dL — AB (ref 6–20)
CO2: 25 mmol/L (ref 22–32)
CREATININE: 1.57 mg/dL — AB (ref 0.61–1.24)
Calcium: 8.4 mg/dL — ABNORMAL LOW (ref 8.9–10.3)
Chloride: 104 mmol/L (ref 101–111)
GFR calc Af Amer: 48 mL/min — ABNORMAL LOW (ref >60)
GFR, EST NON AFRICAN AMERICAN: 41 mL/min — AB (ref >60)
GLUCOSE: 104 mg/dL — AB (ref 65–99)
Potassium: 4.2 mmol/L (ref 3.5–5.1)
SODIUM: 139 mmol/L (ref 135–145)

## 2015-12-13 LAB — PROTIME-INR
INR: 1.66 — ABNORMAL HIGH (ref 0.00–1.49)
PROTHROMBIN TIME: 19 s — AB (ref 11.6–15.2)

## 2015-12-13 LAB — CBC
HCT: 26.1 % — ABNORMAL LOW (ref 39.0–52.0)
Hemoglobin: 8.6 g/dL — ABNORMAL LOW (ref 13.0–17.0)
MCH: 31.9 pg (ref 26.0–34.0)
MCHC: 33 g/dL (ref 30.0–36.0)
MCV: 96.7 fL (ref 78.0–100.0)
PLATELETS: 488 10*3/uL — AB (ref 150–400)
RBC: 2.7 MIL/uL — ABNORMAL LOW (ref 4.22–5.81)
RDW: 14.2 % (ref 11.5–15.5)
WBC: 17.7 10*3/uL — AB (ref 4.0–10.5)

## 2015-12-13 MED ORDER — WARFARIN SODIUM 5 MG PO TABS: 5.0000 mg | ORAL_TABLET | Freq: Once | ORAL | Status: DC

## 2015-12-13 MED ORDER — SUCRALFATE 1 GM/10ML PO SUSP: 1.0000 g | Freq: Three times a day (TID) | ORAL | Status: DC

## 2015-12-13 MED ORDER — SUCRALFATE 1 G PO TABS: 1.0000 g | ORAL_TABLET | Freq: Three times a day (TID) | ORAL | Status: DC

## 2015-12-13 MED ORDER — PANTOPRAZOLE SODIUM 40 MG PO TBEC: 40.0000 mg | DELAYED_RELEASE_TABLET | Freq: Every day | ORAL | Status: DC

## 2015-12-13 MED ORDER — CEFIXIME 400 MG PO CAPS
400.0000 mg | ORAL_CAPSULE | Freq: Every day | ORAL | Status: DC
Start: 2015-12-13 — End: 2016-01-07

## 2015-12-13 MED ORDER — AZITHROMYCIN 500 MG PO TABS: 500.0000 mg | ORAL_TABLET | Freq: Every day | ORAL | Status: DC

## 2015-12-13 MED ORDER — AZITHROMYCIN 250 MG PO TABS
500.0000 mg | ORAL_TABLET | Freq: Once | ORAL | Status: AC
  Administered 2015-12-13: 500 mg via ORAL
  Filled 2015-12-13: qty 2

## 2015-12-13 NOTE — Progress Notes (Signed)
     [facility] Progress Note  Subjective:  Hgb 8.6. EGD 12/12/15:   Findings:  Moderate gastric antral vascular ectasia (suspect some gastritis   compoent also)without bleeding was present in the gastric antrum.   Fulguration to ablate the lesion to prevent bleeding [Cautery unit] was   successful. Estimated blood loss: none.  The exam was otherwise without abnormality.  The cardia and gastric fundus were normal on retroflexion. Impression: - Gastric antral vascular ectasia without bleeding.   Associated gastritis also. Ablated w/ APC  - The examination was otherwise normal.  - No specimens collected. Recommendation: - Return patient to hospital ward for ongoing care.  - Resume previous diet.  - Continue present medications. Feels well this morning. Denies CP. Wants to go home.  Objective:  Vital signs in last 24 hours: Temp:  [97.6 F (36.4 C)-98.3 F (36.8 C)] 98 F (36.7 C) (03/11 0631) Pulse Rate:  [37-110] 79 (03/11 0631) Resp:  [15-31] 20 (03/11 0631) BP: (134-169)/(45-95) 145/78 mmHg (03/11 0631) SpO2:  [90 %-96 %] 95 % (03/11 0922) Weight:  [206 lb 12.7 oz (93.8 kg)] 206 lb 12.7 oz (93.8 kg) (03/11 0631) Last BM Date: 12/08/15 General:   Alert,  Well-developed,    in NAD Heart:  irreg Pulm;decreased BC at bases Abdomen:  Soft, nontender and nondistended. Normal bowel sounds, without guarding, and without rebound.   Extremities:  Without edema.   Lab Results:  Recent Labs  12/11/15 1522 12/12/15 0407 12/13/15 0536  WBC 16.9* 17.2* 17.7*  HGB 7.9* 7.6* 8.6*  HCT 23.6* 23.4* 26.1*  PLT 524* 509* 488*   PT/INR  Recent Labs  12/13/15 0536  LABPROT 19.0*  INR 1.66*   ASSESSMENT/PLAN:  75 year old male with a history of atrial fibrillation on warfarin, amongst a  history of GAVE, GERD, hyperlipidemia, and hypertension, admitted with shortness of breath and cough.EGD with gastritis. Continue PPI and carafate. Wil  Arrange for f/u in [place] office in 1 month.    LOS: 6 days   Hvozdovic, Vita Barley PA-C 12/13/2015, Pager [phone] Mon-Fri 8a-5p [phone] after 5p, weekends, holidays    [facility] Attending   I have taken an interval history, reviewed the chart and examined the patient. I agree with the Advanced Practitioner's note, impression and recommendations.    Improved. Resuming warfarin. Looks like may be going home. GI f/u as above. We will arrange. Would Rx sucralfate tablets (cheaper than slurry usuually at dc and just for 1 month Signing off.  Gatha Mayer, MD, [facility] [phone] (pager) [phone] after 5 PM, weekends and holidays  12/13/2015 11:35 AM

## 2015-12-13 NOTE — Discharge Summary (Signed)
Physician Discharge Summary  [name] [name] [id number] DOB: 09-27-1941 DOA: 12/07/2015  PCP: Claretta Fraise, MD  Admit date: 12/07/2015 Discharge date: 12/13/2015  Time spent: 65 minutes  Recommendations for Outpatient Follow-up:  1. Hb in 2 wks 2. Wean O2 as able 3. will need repeat CXR in 3-4 wks 4. Is he a candidate for a NOAC??   Discharge Condition: stable    Discharge Diagnoses:  Principal Problem:   CAP (community acquired pneumonia) Active Problems:   Acute respiratory failure (HCC)   Iron deficiency anemia   Essential hypertension   COPD (chronic obstructive pulmonary disease) with emphysema (HCC)   Chronic systolic CHF (congestive heart failure) (HCC)   Anticoagulant long-term use   Atrial fibrillation, chronic (HCC)   Acute renal failure superimposed on stage 3 chronic kidney disease (HCC)   Heme + stool   GAVE (gastric antral vascular ectasia)   Elevated troponin   Acute systolic heart failure (HCC)   History of present illness:  75 y.o. male, has a history of atrial fibrillation and heart disease ,gastroesophageal reflux disease hypertension hyperlipidemia , presents with complaints of cough and shortness of breath , chest x-ray with evidence of pneumonia, as well workup significant for acute renal failure and elevated troponin , anemia with Hemoccult-positive stool with supratherapeutic INR, admitted for further workup.  Hospital Course:  Sepsis secondary to pneumonia/ Acute on chronic hypoxic respiratory failure - Patient presents with leukocytosis, tachypnea and tachycardia, secondary to community-acquired pneumonia - sputum culture growing Enterobacter Asburiae-  - has been on Rocephin & Zithromax - still has mild cough, mild hypoxia, infiltrates and leukocytosis (may be due to steroids)  -  Will d/c home with Cefixime and Zithro - blood culture no growth to date, strep pneumoniae antigen negatvie - has been weaned down to 1 L O2- states he has been told  to use 2 L at night - will need repeat CXR in 3-4 wks  COPD exacerbation - will continue with Pulmicort, DuoNeb every 6 hours and when necessary albuterol at home - weaned off Solumedrol to Prednisone - has completed a taper- no wheezing  Anemia/ heme + stool in setting of supra therapeutic INR - Hb 1.5 on 10/21/15 and now down to 7.6- transfuse 1 U PRBC - Iron saturaion low and Ferretin normal (possibly higher than his usual due to it being an acute phase reactant?) - on daily Iron at home - Hemoccult positive- GI consulted-EGD shows AVMs without bleeding which have been ablated - EGD also shows gastritis - will d/c home with Protonix and Carafate- GI will see him in the office and let him know when to stop them - resume Coumadin without bridge- he has been told to have INR done on Tuesday  Elevated troponin - most likely in the setting of acute renal failure and demand ischemia from hypoxia and sepsis. - Troponins have plateaued 0.53>0.52>0.53  Acute on chronic systolic CHF with ventricular ectopy - EF 25-30 % - new drop from EF of 55% in the past - cardiology considering a left and right heart cath as out patient for possibly AICD - diuretics per cardiology- transitioned back to home dose a few days ago- cardiology dose not feel he is fluid overloaded a this time and neither do I - cont Bidil - not on B blocker due to bradycardia  A. Fib/ Chronic anticoagulation with supratherapeutic INR - Heart rate controlled (actually has asymptomatic bradycardia) without rate controlling agent-   - INR 4.48 on admission-reversed with Vit  K- held  Warfarin temporarily for GI bleed  Hypertension - Continue with home medication- Norvasc, Bidil  Acute on chronic kidney disease stage III - Prerenal - improved with hydration   Procedures: 2 D ECHO Study Conclusions  - Left ventricle: The cavity size was severely dilated. Wall  thickness was normal. Systolic function was severely reduced.  The  estimated ejection fraction was in the range of 25% to 30%. - Mitral valve: There was mild to moderate regurgitation. - Left atrium: The atrium was moderately dilated. - Right ventricle: Systolic function was mildly to moderately  reduced. - Right atrium: The atrium was mildly dilated. - Pulmonary arteries: PA peak pressure: 47 mm Hg (S).  EGD Gastric antral vascular ectasia without bleeding.   Associated gastritis also. Ablated w/ APC  - The examination was otherwise normal.  - No specimens collected.  Consultations: GI, cardiology  Discharge Exam: Filed Weights   12/10/15 0645 12/12/15 0603 12/13/15 0631  Weight: 92.534 kg (204 lb) 92.1 kg (203 lb 0.7 oz) 93.8 kg (206 lb 12.7 oz)   Filed Vitals:   12/13/15 0025 12/13/15 0631  BP: 138/72 145/78  Pulse: 96 79  Temp: 97.6 F (36.4 C) 98 F (36.7 C)  Resp: 18 20    General: AAO x 3, no distress Cardiovascular: RRR, no murmurs  Respiratory: clear to auscultation bilaterally- GI: soft, non-tender, non-distended, bowel sound positive  Discharge Instructions You were cared for by a hospitalist during your hospital stay. If you have any questions about your discharge medications or the care you received while you were in the hospital after you are discharged, you can call the unit and asked to speak with the hospitalist on call if the hospitalist that took care of you is not available. Once you are discharged, your primary care physician will handle any further medical issues. Please note that NO REFILLS for any discharge medications will be authorized once you are discharged, as it is imperative that you return to your primary care physician (or establish a relationship with a primary care physician if you do not have one) for your aftercare needs so that they can reassess your need for medications and monitor your lab values.      Discharge  Instructions    (HEART FAILURE PATIENTS) Call MD:  Anytime you have any of the following symptoms: 1) 3 pound weight gain in 24 hours or 5 pounds in 1 week 2) shortness of breath, with or without a dry hacking cough 3) swelling in the hands, feet or stomach 4) if you have to sleep on extra pillows at night in order to breathe.    Complete by:  As directed      Call MD for:  difficulty breathing, headache or visual disturbances    Complete by:  As directed      Call MD for:  persistant dizziness or light-headedness    Complete by:  As directed      Call MD for:    Complete by:  As directed   Call you GI doctor if there is any blood in your stool or if you vomit blood     Diet - low sodium heart healthy    Complete by:  As directed      Discharge instructions    Complete by:  As directed   Wear 1 L O2 during the day for now- have you PCP check O2 level in office to see if you can come off of the daytime  O2.     Increase activity slowly    Complete by:  As directed             Medication List    TAKE these medications        acetaminophen 500 MG tablet  Commonly known as:  TYLENOL  Take 1,000 mg by mouth every 6 (six) hours as needed for mild pain. For pain.     amLODipine 10 MG tablet  Commonly known as:  NORVASC  Take 1 tablet (10 mg total) by mouth daily.     aspirin EC 81 MG tablet  Take 1 tablet (81 mg total) by mouth every Monday, Wednesday, and Friday.     azithromycin 500 MG tablet  Commonly known as:  ZITHROMAX  Take 1 tablet (500 mg total) by mouth daily.     BIDIL 20-37.5 MG tablet  Generic drug:  isosorbide-hydrALAZINE  Take 1 tablet by mouth 3  times daily     budesonide 0.25 MG/2ML nebulizer solution  Commonly known as:  PULMICORT  Take 0.25 mg by nebulization 2 (two) times daily.     Cefixime 400 MG Caps capsule  Commonly known as:  SUPRAX  Take 1 capsule (400 mg total) by mouth daily.     docusate sodium 100 MG capsule  Commonly known as:  COLACE   Take 100 mg by mouth 2 (two) times daily as needed for mild constipation.     ferrous sulfate 325 (65 FE) MG tablet  Take 325 mg by mouth daily with breakfast.     furosemide 40 MG tablet  Commonly known as:  LASIX  Take 1 tablet by mouth  daily     guaiFENesin 600 MG 12 hr tablet  Commonly known as:  MUCINEX  Take 600 mg by mouth 2 (two) times daily as needed for cough or to loosen phlegm.     HYDROcodone-acetaminophen 5-325 MG tablet  Commonly known as:  NORCO  Take 1 tablet by mouth every 6 (six) hours as needed for moderate pain.     ipratropium-albuterol 0.5-2.5 (3) MG/3ML Soln  Commonly known as:  DUONEB  Take 3 mLs by nebulization every 6 (six) hours as needed. For shortness of breath.     levothyroxine 112 MCG tablet  Commonly known as:  SYNTHROID, LEVOTHROID  TAKE 1 TABLET (112 MCG TOTAL) BY MOUTH DAILY BEFORE BREAKFAST.     lovastatin 40 MG tablet  Commonly known as:  MEVACOR  Take 1 tablet by mouth at  bedtime     multivitamins ther. w/minerals Tabs tablet  Take 1 tablet by mouth daily.     nitroGLYCERIN 0.4 MG SL tablet  Commonly known as:  NITROSTAT  Place 0.4 mg under the tongue every 5 (five) minutes as needed for chest pain.     pantoprazole 40 MG tablet  Commonly known as:  PROTONIX  Take 1 tablet (40 mg total) by mouth daily. Switch for any other PPI at similar dose and frequency     polyethylene glycol powder powder  Commonly known as:  GLYCOLAX/MIRALAX  Take 1 capful (17 grams) dissolved in at least 8 ounces water/juice once daily.     potassium chloride 10 MEQ tablet  Commonly known as:  K-DUR  Take 1 tablet (10 mEq total) by mouth daily.     PROAIR HFA 108 (90 Base) MCG/ACT inhaler  Generic drug:  albuterol  Use 2 puffs every 6 hours  as needed for shortness of  breath  sucralfate 1 g tablet  Commonly known as:  CARAFATE  Take 1 tablet (1 g total) by mouth 4 (four) times daily -  with meals and at bedtime.     Vitamin D3 5000 units  Caps  Take 5,000 Units by mouth daily.     warfarin 5 MG tablet  Commonly known as:  COUMADIN  Take 1 tablet by mouth  every day or as directed by anticoagulation clinic       No Known Allergies Follow-up Information    Follow up with PYRTLE, Lajuan Lines, MD.   Specialty:  Gastroenterology   Why:  GI office will contact you with follow up appt.   Contact information:   [address] [place] [address] [phone]       Follow up with Claretta Fraise, MD In 2 days.   Specialty:  Family Medicine   Why:  have your coumadin level checked and have PCP see your on Tuesday   Contact information:   [place] [place] [address] [phone]        The results of significant diagnostics from this hospitalization (including imaging, microbiology, ancillary and laboratory) are listed below for reference.    Significant Diagnostic Studies: Dg Chest 2 View  12/07/2015  CLINICAL DATA:  Cough. Weakness. Fever. Shortness of breath COPD. Asthma. Coronary artery disease. EXAM: CHEST  2 VIEW COMPARISON:  12/25/2014 FINDINGS: Prior median sternotomy. Midline trachea. Moderate cardiomegaly. Mild right hemidiaphragm elevation. No pleural effusion or pneumothorax. Diffuse peribronchial thickening. Left upper lobe patchy airspace disease. IMPRESSION: Left upper lobe airspace disease, most consistent with pneumonia. Followup PA and lateral chest X-ray is recommended in 3-4 weeks following trial of antibiotic therapy to ensure resolution and exclude underlying malignancy. Underlying cardiomegaly with chronic interstitial thickening, likely related to COPD/ chronic bronchitis. Electronically Signed   By: Abigail Miyamoto M.D.   On: 12/07/2015 11:40   Dg Chest Port 1 View  12/12/2015  CLINICAL DATA:  Shortness of breath, pneumonia, cough, hypertension, smoker, coronary disease post MI and CABG EXAM: PORTABLE CHEST 1 VIEW COMPARISON:  Portable exam 1643 hours compared to 12/07/2015 FINDINGS: Enlargement of  cardiac silhouette post CABG. Mediastinal contours normal. Patchy the LEFT upper lobe infiltrate consistent with pneumonia. New LEFT lower lobe consolidation. Central peribronchial thickening. No pneumothorax or definite pleural effusion. IMPRESSION: Enlargement of cardiac silhouette post CABG. Patchy LEFT upper lobe and new LEFT lower lobe infiltrates favoring pneumonia. Electronically Signed   By: Lavonia Dana M.D.   On: 12/12/2015 17:00    Microbiology: Recent Results (from the past 240 hour(s))  Culture, blood (routine x 2) Call MD if unable to obtain prior to antibiotics being given     Status: None   Collection Time: 12/07/15  5:00 PM  Result Value Ref Range Status   Specimen Description BLOOD  Final   Special Requests NONE  Final   Culture   Final    NO GROWTH 5 DAYS Performed at [facility]    Report Status 12/12/2015 FINAL  Final  Culture, blood (routine x 2) Call MD if unable to obtain prior to antibiotics being given     Status: None   Collection Time: 12/07/15  5:09 PM  Result Value Ref Range Status   Specimen Description BLOOD  Final   Special Requests NONE  Final   Culture   Final    NO GROWTH 5 DAYS Performed at [facility]    Report Status 12/12/2015 FINAL  Final  Culture, sputum-assessment     Status: None   Collection Time: 12/07/15  6:00 PM  Result Value Ref Range Status   Specimen Description SPUTUM  Final   Special Requests NONE  Final   Sputum evaluation   Final    THIS SPECIMEN IS ACCEPTABLE. RESPIRATORY CULTURE REPORT TO FOLLOW.   Report Status 12/07/2015 FINAL  Final  Culture, respiratory (NON-Expectorated)     Status: None   Collection Time: 12/07/15  6:00 PM  Result Value Ref Range Status   Specimen Description SPUTUM  Final   Special Requests NONE  Final   Gram Stain   Final    ABUNDANT WBC PRESENT, PREDOMINANTLY PMN FEW SQUAMOUS EPITHELIAL CELLS PRESENT MODERATE GRAM POSITIVE COCCI IN PAIRS RARE GRAM VARIABLE ROD Performed at  [facility]    Culture   Final    MODERATE ENTEROBACTER ASBURIAE Culture reincubated for better growth Performed at [facility]    Report Status 12/10/2015 FINAL  Final     Labs: Basic Metabolic Panel:  Recent Labs Lab 12/08/15 0155  12/09/15 0419 12/10/15 0444 12/11/15 0418 12/12/15 0407 12/13/15 0536  NA  --   < > 143 138 138 140 139  K  --   < > 4.1 4.6 4.4 4.3 4.2  CL  --   < > 110 105 104 103 104  CO2  --   < > 22 21* 23 25 25   GLUCOSE  --   < > 111* 187* 139* 133* 104*  BUN  --   < > 35* 38* 46* 49* 44*  CREATININE  --   < > 1.63* 1.65* 1.73* 1.56* 1.57*  CALCIUM  --   < > 8.4* 8.5* 8.9 8.8* 8.4*  MG 2.3  --   --   --   --   --   --   < > = values in this interval not displayed. Liver Function Tests: No results for input(s): AST, ALT, ALKPHOS, BILITOT, PROT, ALBUMIN in the last 168 hours. No results for input(s): LIPASE, AMYLASE in the last 168 hours. No results for input(s): AMMONIA in the last 168 hours. CBC:  Recent Labs Lab 12/10/15 0444 12/11/15 0418 12/11/15 1522 12/12/15 0407 12/13/15 0536  WBC 9.9 18.2* 16.9* 17.2* 17.7*  HGB 8.3* 7.7* 7.9* 7.6* 8.6*  HCT 26.0* 23.6* 23.6* 23.4* 26.1*  MCV 96.3 96.3 91.8 96.3 96.7  PLT 458* 488* 524* 509* 488*   Cardiac Enzymes:  Recent Labs Lab 12/07/15 1515 12/07/15 2009 12/08/15 0153  TROPONINI 0.53* 0.52* 0.53*   BNP: BNP (last 3 results)  Recent Labs  12/07/15 1056  BNP 566.9*    ProBNP (last 3 results) No results for input(s): PROBNP in the last 8760 hours.  CBG: No results for input(s): GLUCAP in the last 168 hours.     SignedDebbe Odea, MD [facility] 12/13/2015, 1:03 PM

## 2015-12-13 NOTE — Progress Notes (Signed)
SATURATION QUALIFICATIONS: (This note is used to comply with regulatory documentation for home oxygen)  Patient Saturations on Room Air at Rest = 90%  Patient Saturations on Room Air while Ambulating = 88%  Patient Saturations on 1 Liters of oxygen while Ambulating = 92%  Please briefly explain why patient needs home oxygen:

## 2015-12-13 NOTE — Progress Notes (Signed)
PT Cancellation Note  Patient Details Name: [name] [name] MRN: [id number] DOB: 02/23/1941   Cancelled Treatment:     Pt waiting for DC home. Pt has been walking around in halls and no need for Pt at this time. Will sign off.     Clide Dales 12/13/2015, 3:20 PM  Clide Dales, PT Pager: [phone] 12/13/2015

## 2015-12-13 NOTE — Progress Notes (Signed)
Patient Name: [name] [name] Date of Encounter: 12/13/2015     Active Problems:   Iron deficiency anemia   Essential hypertension   COPD (chronic obstructive pulmonary disease) with emphysema (HCC)   Chronic systolic CHF (congestive heart failure) (HCC)   Anticoagulant long-term use   Atrial fibrillation, chronic (HCC)   CAP (community acquired pneumonia)   Pneumathemia ([place])   Acute renal failure superimposed on stage 3 chronic kidney disease (HCC)   Heme + stool   Decreased hemoglobin   GAVE (gastric antral vascular ectasia)   Elevated troponin   Absolute anemia   Acute systolic heart failure ([place])    SUBJECTIVE  75 y.o. with known ischemic DCM. Admitted with 3-4 day history of increasing dyspnea. No chest pain. Has significant COPD on oxygen at home Sats down 84% with CXR showing LUL pneumonia with marked leukocytosis. Troponins mildly elevated No acute ECG changes. Previous EF 30-40% by echo with old IMI. Echo 3/6 read as EF 25-30%. Clinically not particularly volume overloaded. URI with cough and afebrile at this time  Getting a breathing treatment. No complaints. Feeling better after IV lasix.    CURRENT MEDS . amLODipine  10 mg Oral Daily  . budesonide  0.25 mg Nebulization BID  . cefTRIAXone (ROCEPHIN)  IV  1 g Intravenous Q24H  . cholecalciferol  5,000 Units Oral Daily  . ferrous sulfate  325 mg Oral Q breakfast  . fluticasone  1 spray Each Nare Daily  . furosemide  40 mg Oral Daily  . ipratropium-albuterol  3 mL Nebulization Q6H  . isosorbide-hydrALAZINE  1 tablet Oral BID  . lactose free nutrition  237 mL Oral TID WC  . levothyroxine  112 mcg Oral QAC breakfast  . multivitamin with minerals  1 tablet Oral Daily  . pravastatin  20 mg Oral q1800  . sodium chloride flush  3 mL Intravenous Q12H  . sucralfate  1 g Oral TID WC & HS  . Warfarin - Pharmacist Dosing Inpatient   Does not apply q1800    OBJECTIVE  Filed Vitals:   12/12/15 2200 12/12/15  2215 12/13/15 0025 12/13/15 0631  BP: 161/73 152/59 138/72 145/78  Pulse: 50 52 96 79  Temp: 97.9 F (36.6 C) 97.6 F (36.4 C) 97.6 F (36.4 C) 98 F (36.7 C)  TempSrc: Oral Oral Oral Oral  Resp: 20 20 18 20   Height:      Weight:    206 lb 12.7 oz (93.8 kg)  SpO2: 92% 96% 95% 95%    Intake/Output Summary (Last 24 hours) at 12/13/15 0908 Last data filed at 12/13/15 0025  Gross per 24 hour  Intake    830 ml  Output    525 ml  Net    305 ml   Filed Weights   12/10/15 0645 12/12/15 0603 12/13/15 0631  Weight: 204 lb (92.534 kg) 203 lb 0.7 oz (92.1 kg) 206 lb 12.7 oz (93.8 kg)    PHYSICAL EXAM  General: Pleasant, NAD. Getting breathing treatment Neuro: Alert and oriented X 3. Moves all extremities spontaneously. Psych: Normal affect. HEENT:  Normal  Neck: Supple without bruits or JVD. Lungs:  Resp regular and unlabored, decreased breath sounds at bases. Mild wheezes Heart: irreg irreg. no s3, s4, or murmurs. Abdomen: Soft, non-tender, non-distended, BS + x 4.  Extremities: No clubbing, cyanosis or edema. DP/PT/Radials 2+ and equal bilaterally.  Accessory Clinical Findings  CBC  Recent Labs  12/12/15 0407 12/13/15 0536  WBC 17.2* 17.7*  HGB 7.6* 8.6*  HCT 23.4* 26.1*  MCV 96.3 96.7  PLT 509* [id number]*   Basic Metabolic Panel  Recent Labs  12/12/15 0407 12/13/15 0536  NA 140 139  K 4.3 4.2  CL 103 104  CO2 25 25  GLUCOSE 133* 104*  BUN 49* 44*  CREATININE 1.56* 1.57*  CALCIUM 8.8* 8.4*    TELE  afib with ventricular bigminy and freq PVCs noted on tele. HR in high 90s   Radiology/Studies  Dg Chest 2 View  12/07/2015  CLINICAL DATA:  Cough. Weakness. Fever. Shortness of breath COPD. Asthma. Coronary artery disease. EXAM: CHEST  2 VIEW COMPARISON:  12/25/2014 FINDINGS: Prior median sternotomy. Midline trachea. Moderate cardiomegaly. Mild right hemidiaphragm elevation. No pleural effusion or pneumothorax. Diffuse peribronchial thickening. Left upper lobe  patchy airspace disease. IMPRESSION: Left upper lobe airspace disease, most consistent with pneumonia. Followup PA and lateral chest X-ray is recommended in 3-4 weeks following trial of antibiotic therapy to ensure resolution and exclude underlying malignancy. Underlying cardiomegaly with chronic interstitial thickening, likely related to COPD/ chronic bronchitis. Electronically Signed   By: Abigail Miyamoto M.D.   On: 12/07/2015 11:40   Dg Chest Port 1 View  12/12/2015  CLINICAL DATA:  Shortness of breath, pneumonia, cough, hypertension, smoker, coronary disease post MI and CABG EXAM: PORTABLE CHEST 1 VIEW COMPARISON:  Portable exam 1643 hours compared to 12/07/2015 FINDINGS: Enlargement of cardiac silhouette post CABG. Mediastinal contours normal. Patchy the LEFT upper lobe infiltrate consistent with pneumonia. New LEFT lower lobe consolidation. Central peribronchial thickening. No pneumothorax or definite pleural effusion. IMPRESSION: Enlargement of cardiac silhouette post CABG. Patchy LEFT upper lobe and new LEFT lower lobe infiltrates favoring pneumonia. Electronically Signed   By: Lavonia Dana M.D.   On: 12/12/2015 17:00     2D ECHO : 12/08/2015 LV EF: 25% - 30% Study Conclusions - Left ventricle: The cavity size was severely dilated. Wall  thickness was normal. Systolic function was severely reduced. The  estimated ejection fraction was in the range of 25% to 30%. - Mitral valve: There was mild to moderate regurgitation. - Left atrium: The atrium was moderately dilated. - Right ventricle: Systolic function was mildly to moderately  reduced. - Right atrium: The atrium was mildly dilated. - Pulmonary arteries: PA peak pressure: 47 mm Hg (S). Impressions - Apical windows are foreshortened. Frequent PVCs throughout study  Make evaluation of function difficult.   ASSESSMENT AND PLAN  75 yo male w/ ICM, EF 25-30% by echo 12/08/2015, CAD s/p CABG 1998 and PCI (BMS to SVG-RCA in 2015), HTN,  atrial fib, GERD, COPD, RBBB, and Hx gastric antral vasclar dysplasia who was admitted 12/07/15 w/ PNA/sepsis, cards consulted for elevated troponin.  Elevated Troponin: No chest pain or acute ECG changes. No true crescendo/decrescendo pattern. Has had worsening dyspnea on exertion over the recent past. Felt demand ischemia in the setting of sepsis and PNA. Plan for possible L/RHC most likely as an outpatient   CAD: h/o CABG + PCI/BMS to the SVB-RCA in 2015. Continue statin. No BB due to hx of pauses and no ASA with anemia and FOBT + stools.   Chronic systolic CHF: EF described at cath 07/2014 as 35%>>55% by echo 10/2014 and now 25-30% by echo. Most of dyspnea from active pneumonia BNP mildly elevated at 566 - With increasing weight, +I/O and increased O2 needs, he was started on IV lasix. Creat started to bump with IV diuresis and I converted him to PO lasix at 40mg  daily (  home dose) yesterday - continue BiDil. On no BB due to hx of sinus pauses of AVN blocking agents.  - will need to evaluate for possible AICD with EF <35%  Pneumonia: Clinically this has been main issue with markedly abnormal exam, leukocytosis and CXR LUL penumonia. This is on top of baseline nighttime oxygen dependant COPD.    QX:[id number] 1.65--> 1.73--> 1.56  Afib:he has a history of  sinus pauses of AVN blocking agents, therefore on no rate control. His HR is in 90s currently.  - On chronic anticoagulation with coumadin for CHADS2VASC=4 - INR 4.48 on admit.  Coumadin reversed due to anemia Heparin when ok with GI. - since INR subtherapeutic, pt on SCDs  Anemia with Hemoccult-positive stool with supratherapeutic INR: coumadin reversed. H/H falling again. Hx gastric antral vasclar dysplasia. GI following and hopefully EGD today.   Ventricular ectopy: ventricular bigminy and freq PVCs noted on tele.   He is stable from a cardiac standpoint I think he can go home .  Follow up with Dr. Tamala Julian or PA in the office.  He  will need a right and left heart cath at some point     Coen Miyasato, Wonda Cheng, MD  12/13/2015 9:17 AM    [address],  [place], [place]  [address] Pager [phone] Phone: [phone]; Fax: [phone]

## 2015-12-13 NOTE — Progress Notes (Signed)
ANTICOAGULATION CONSULT NOTE - Follow Up Consult  Pharmacy Consult for Warfarin Indication: atrial fibrillation  No Known Allergies  Patient Measurements: Height: 5\' 10"  (177.8 cm) Weight: 206 lb 12.7 oz (93.8 kg) IBW/kg (Calculated) : 73  Vital Signs: Temp: 98 F (36.7 C) (03/11 0631) Temp Source: Oral (03/11 0631) BP: 145/78 mmHg (03/11 0631) Pulse Rate: 79 (03/11 0631)  Labs:  Recent Labs  12/11/15 0418 12/11/15 1522 12/12/15 0407 12/13/15 0536  HGB 7.7* 7.9* 7.6* 8.6*  HCT 23.6* 23.6* 23.4* 26.1*  PLT 488* 524* 509* 488*  LABPROT  --   --   --  19.0*  INR  --   --   --  1.66*  CREATININE 1.73*  --  1.56* 1.57*    Estimated Creatinine Clearance: 46 mL/min (by C-G formula based on Cr of 1.57).   Medical History: Past Medical History  Diagnosis Date  . Unspecified essential hypertension   . Anemia   . Asthma   . Hepatomegaly   . Esophageal reflux   . Other and unspecified coagulation defects   . Hiatal hernia   . Personal history of colonic polyps 05/29/2010    TUBULAR ADENOMA  . CAD (coronary artery disease)   . Hypothyroidism   . Gout   . Hyperlipidemia   . Gastric antral vascular ectasia 2013  . Atrial fibrillation ([place])   . History of blood transfusion ~ 2012    "blood count dropped; had to get 3 units"  . Arthritis     "hips; back" (07/09/2014)  . COPD (chronic obstructive pulmonary disease) (HCC)     on home oxygen, 2 liters at night10/2015  . Gallstones   . 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   . Cholecystitis 11/2013    Medications:  Scheduled:  . amLODipine  10 mg Oral Daily  . budesonide  0.25 mg Nebulization BID  . cefTRIAXone (ROCEPHIN)  IV  1 g Intravenous Q24H  . cholecalciferol  5,000 Units Oral Daily  . ferrous sulfate  325 mg Oral Q breakfast  . fluticasone  1 spray Each Nare Daily  . furosemide  40 mg Oral Daily  . ipratropium-albuterol  3 mL Nebulization Q6H  . isosorbide-hydrALAZINE  1 tablet Oral BID  .  lactose free nutrition  237 mL Oral TID WC  . levothyroxine  112 mcg Oral QAC breakfast  . multivitamin with minerals  1 tablet Oral Daily  . pravastatin  20 mg Oral q1800  . sodium chloride flush  3 mL Intravenous Q12H  . sucralfate  1 g Oral TID WC & HS  . Warfarin - Pharmacist Dosing Inpatient   Does not apply q1800   Infusions:   PRN: acetaminophen **OR** acetaminophen, albuterol, ALPRAZolam, docusate sodium, guaiFENesin, HYDROcodone-acetaminophen, naphazoline-glycerin, nitroGLYCERIN, promethazine  Assessment: 75 y.o. male, has a history of atrial fibrillation on chronic warfarin, presents on 3/5 with SHoB and cough, CXR with evidence of pneumonia, as well workup significant for acute renal failure and elevated troponin , anemia with Hemoccult-positive stool with supratherapeutic INR (4.48), admitted for further workup.  GIHe was given vitamin K and warfarin has been on hold since. GI was consulted - EGD shows AVMs without bleeding. Today, 3/10, Pharmacy is consulted to resume warfarin dosing.   Home dosage reported as 5mg  MWF and Sat, 2.5mg  Thurs and Sun  Today's INR 1.66, warfarin resumed last night  Hgb low but improved to 8.6, no active bleeding reported  Drug interaction potential with azithromycin (last  dose charted today) - started warfarin dosing conservatively  Goal of Therapy:  INR 2-3   Plan:   Warfarin 5mg  PO x 1 today  Daily PT/INR  Monitor closely for signs of bleeding  Hershal Coria, PharmD, BCPS Pager: [phone] 12/13/2015 12:49 PM

## 2015-12-14 ENCOUNTER — Other Ambulatory Visit: Payer: Self-pay | Admitting: Interventional Cardiology

## 2015-12-15 ENCOUNTER — Telehealth: Payer: Self-pay | Admitting: Family Medicine

## 2015-12-15 ENCOUNTER — Telehealth: Payer: Self-pay

## 2015-12-15 ENCOUNTER — Encounter: Payer: Self-pay | Admitting: Family Medicine

## 2015-12-15 ENCOUNTER — Ambulatory Visit (INDEPENDENT_AMBULATORY_CARE_PROVIDER_SITE_OTHER): Payer: Medicare Other | Admitting: Family Medicine

## 2015-12-15 VITALS — BP 133/56 | HR 97 | Temp 97.3°F | Ht 70.0 in | Wt 200.0 lb

## 2015-12-15 DIAGNOSIS — D509 Iron deficiency anemia, unspecified: Secondary | ICD-10-CM

## 2015-12-15 DIAGNOSIS — N189 Chronic kidney disease, unspecified: Secondary | ICD-10-CM

## 2015-12-15 DIAGNOSIS — J189 Pneumonia, unspecified organism: Secondary | ICD-10-CM

## 2015-12-15 DIAGNOSIS — N183 Chronic kidney disease, stage 3 unspecified: Secondary | ICD-10-CM

## 2015-12-15 DIAGNOSIS — I482 Chronic atrial fibrillation, unspecified: Secondary | ICD-10-CM

## 2015-12-15 DIAGNOSIS — I5021 Acute systolic (congestive) heart failure: Secondary | ICD-10-CM

## 2015-12-15 DIAGNOSIS — J438 Other emphysema: Secondary | ICD-10-CM

## 2015-12-15 DIAGNOSIS — Z7901 Long term (current) use of anticoagulants: Secondary | ICD-10-CM | POA: Diagnosis not present

## 2015-12-15 DIAGNOSIS — D631 Anemia in chronic kidney disease: Secondary | ICD-10-CM

## 2015-12-15 DIAGNOSIS — N179 Acute kidney failure, unspecified: Secondary | ICD-10-CM | POA: Diagnosis not present

## 2015-12-15 LAB — TYPE AND SCREEN
ABO/RH(D): A POS
Antibody Screen: NEGATIVE
Unit division: 0

## 2015-12-15 LAB — CULTURE, RESPIRATORY

## 2015-12-15 LAB — CULTURE, RESPIRATORY W GRAM STAIN

## 2015-12-15 LAB — COAGUCHEK XS/INR WAIVED
INR: 1.4 — AB (ref 0.9–1.1)
PROTHROMBIN TIME: 16.5 s

## 2015-12-15 NOTE — Patient Instructions (Signed)
Take an extra 1/2 coumadin today. Then take one daily until next appt.

## 2015-12-15 NOTE — Telephone Encounter (Signed)
Pt scheduled to see Dr. Hilarie Fredrickson 02/04/16@11am . Appt letter mailed to pt.

## 2015-12-15 NOTE — Progress Notes (Signed)
Subjective:  Patient ID: [name] [name], male    DOB: 01-21-1941  Age: 75 y.o. MRN: [id number]  CC: Hospitalization Follow-up   HPI [name] [name] presents for feeling bad after hospitalization.Very weak. He was hospitalized from March 5 to March 11. Hospital report reviewed. It stated CHF, Pneumonia and acute renal insufficiency were the primary dxes. He was given antibiotics and diuresed. He had a blood transfusion as well for hemoglobin down to 7.6 He continues to cough. Appetite is poor. He is short of breath and using oxygen regularly. He has been using oxygen long-term due to COPD, however, the oxygen dependency has significantly increased recently. Additionally during the hospital stay he had an echocardiogram showing an ejection fraction of 25-30%. Patient states he's had heart failure ever since he had his bypass in 1998. He is interested in trying a new oral anticoagulant instead of Coumadin. However, he wants me to get Dr. Thompson Caul approval for this before he is willing to try that. He states that he does have some shortness of breath but he is using his inhalers and neb treatments at home with DuoNeb and pro-air with good relief. He has azithromycin and cefixime that he is taking at home as well. He denies fever chills and sweats.. CXR performed on 3/10 sshowed left upper and lower lobe  infiltrates.   History [name] [name] has a past medical history of Unspecified essential hypertension; Anemia; Asthma; Hepatomegaly; Esophageal reflux; Other and unspecified coagulation defects; Hiatal hernia; Personal history of colonic polyps (05/29/2010); CAD (coronary artery disease); Hypothyroidism; Gout; Hyperlipidemia; Gastric antral vascular ectasia (2013); Atrial fibrillation ([place]); History of blood transfusion (~ 2012); Arthritis; COPD (chronic obstructive pulmonary disease) ([place]); Gallstones; CKD (chronic kidney disease) (2015); and Cholecystitis (11/2013).   He has past surgical history that includes  Coronary angioplasty with stent (~ 2008; 07/08/2014); Coronary artery bypass graft (1998); Hernia repair; Umbilical hernia repair (1970's); left and right heart catheterization with coronary/graft angiogram (N/A, 07/09/2014); Colonoscopy (2013); Esophagogastroduodenoscopy (2013); Givens capsule study (2013); and cholecystomy tube (12/28/2014 - 01/06/2015).   His family history includes Cancer in his father; Early death in his brother; Heart attack in his brother; Heart disease in his father; Kidney disease in his mother; Leukemia in his mother; Stomach cancer in his sister and sister. There is no history of Colon cancer.He reports that he quit smoking about 19 years ago. His smoking use included Cigarettes. He has a 42 pack-year smoking history. He has never used smokeless tobacco. He reports that he drinks about 0.6 oz of alcohol per week. He reports that he does not use illicit drugs.    ROS Review of Systems  Constitutional: Negative for fever, chills, diaphoresis and unexpected weight change.  HENT: Negative for congestion, hearing loss, rhinorrhea and sore throat.   Eyes: Negative for visual disturbance.  Respiratory: Positive for cough, shortness of breath and wheezing.   Cardiovascular: Negative for chest pain.  Gastrointestinal: Positive for constipation (e did have a bowel movement finally this morning after a week without 1). Negative for abdominal pain and diarrhea.  Genitourinary: Negative for dysuria and flank pain.  Musculoskeletal: Negative for joint swelling and arthralgias.  Skin: Negative for rash.  Neurological: Negative for dizziness and headaches.  Psychiatric/Behavioral: Negative for sleep disturbance and dysphoric mood.    Objective:  BP 133/56 mmHg  Pulse 97  Temp(Src) 97.3 F (36.3 C) (Oral)  Ht '5\' 10"'$  (1.778 m)  Wt 200 lb (90.719 kg)  BMI 28.70 kg/m2  SpO2 93%  BP  Readings from Last 3 Encounters:  12/15/15 133/56  12/13/15 145/78  12/04/15 134/60    Wt  Readings from Last 3 Encounters:  12/15/15 200 lb (90.719 kg)  12/13/15 206 lb 12.7 oz (93.8 kg)  12/02/15 197 lb (89.359 kg)     Physical Exam  Constitutional: He is oriented to person, place, and time. He appears well-developed and well-nourished. No distress.  HENT:  Head: Normocephalic and atraumatic.  Right Ear: External ear normal.  Left Ear: External ear normal.  Nose: Nose normal.  Mouth/Throat: Oropharynx is clear and moist.  Eyes: Conjunctivae and EOM are normal. Pupils are equal, round, and reactive to light.  Neck: Normal range of motion. Neck supple. No thyromegaly present.  Cardiovascular: Normal rate, regular rhythm and normal heart sounds.   No murmur heard. Pulmonary/Chest: Effort normal. No respiratory distress. He has wheezes (throughout, moderate resp. exchange with decreased exp. phase). He has no rales.  Abdominal: Soft. Bowel sounds are normal. He exhibits no distension. There is no tenderness.  Lymphadenopathy:    He has no cervical adenopathy.  Neurological: He is alert and oriented to person, place, and time. He has normal reflexes.  Skin: Skin is warm and dry.  Psychiatric: He has a normal mood and affect. His behavior is normal. Judgment and thought content normal.     Lab Results  Component Value Date   WBC 17.7* 12/13/2015   HGB 8.6* 12/13/2015   HCT 26.1* 12/13/2015   PLT 488* 12/13/2015   GLUCOSE 104* 12/13/2015   CHOL 134 09/24/2015   TRIG 101 09/24/2015   HDL 44 09/24/2015   LDLCALC 70 09/24/2015   ALT 14 10/30/2015   AST 20 10/30/2015   NA 139 12/13/2015   K 4.2 12/13/2015   CL 104 12/13/2015   CREATININE 1.57* 12/13/2015   BUN 44* 12/13/2015   CO2 25 12/13/2015   TSH 3.410 04/23/2015   PSA 0.78 11/26/2011   INR 1.66* 12/13/2015    Dg Chest 2 View  12/07/2015  CLINICAL DATA:  Cough. Weakness. Fever. Shortness of breath COPD. Asthma. Coronary artery disease. EXAM: CHEST  2 VIEW COMPARISON:  12/25/2014 FINDINGS: Prior median  sternotomy. Midline trachea. Moderate cardiomegaly. Mild right hemidiaphragm elevation. No pleural effusion or pneumothorax. Diffuse peribronchial thickening. Left upper lobe patchy airspace disease. IMPRESSION: Left upper lobe airspace disease, most consistent with pneumonia. Followup PA and lateral chest X-ray is recommended in 3-4 weeks following trial of antibiotic therapy to ensure resolution and exclude underlying malignancy. Underlying cardiomegaly with chronic interstitial thickening, likely related to COPD/ chronic bronchitis. Electronically Signed   By: Abigail Miyamoto M.D.   On: 12/07/2015 11:40    Assessment & Plan:   [name] [name] was seen today for hospitalization follow-up.  Diagnoses and all orders for this visit:  CAP (community acquired pneumonia) -     CBC with Differential/Platelet -     CMP14+EGFR -     Brain natriuretic peptide  Acute renal failure superimposed on stage 3 chronic kidney disease (HCC) -     CMP14+EGFR -     Brain natriuretic peptide  Acute systolic heart failure (HCC) -     Brain natriuretic peptide  Anticoagulant long-term use  Atrial fibrillation, chronic (HCC) -     CoaguChek XS/INR Waived -     CBC with Differential/Platelet  Anemia in chronic renal disease -     CBC with Differential/Platelet -     CMP14+EGFR -     Reticulocytes -     Erythropoietin  CKD (chronic kidney disease), stage III -     CMP14+EGFR -     Reticulocytes -     Erythropoietin  Other emphysema (HCC)  Iron deficiency anemia -     CBC with Differential/Platelet -     Reticulocytes -     Erythropoietin      I am having [name] [name] maintain his ipratropium-albuterol, budesonide, multivitamins ther. w/minerals, Vitamin D3, acetaminophen, nitroGLYCERIN, ferrous sulfate, aspirin EC, docusate sodium, levothyroxine, amLODipine, HYDROcodone-acetaminophen, warfarin, furosemide, BIDIL, polyethylene glycol powder, PROAIR HFA, lovastatin, guaiFENesin, pantoprazole, Cefixime,  azithromycin, sucralfate, and potassium chloride.  No orders of the defined types were placed in this encounter.     Follow-up: Return in about 8 days (around 12/23/2015).  Claretta Fraise, M.D.

## 2015-12-15 NOTE — Telephone Encounter (Signed)
-----   Message from [employer], PA-C sent at 12/13/2015 10:14 AM EST ----- Pt discharged from hospital over weekend. Needs f/u appt  With Dr Hilarie Fredrickson or APP in 1 month.

## 2015-12-15 NOTE — Telephone Encounter (Signed)
Per [employer] he would like to see him today at 1:25. Appointment scheduled.

## 2015-12-16 ENCOUNTER — Telehealth: Payer: Self-pay | Admitting: Family Medicine

## 2015-12-16 LAB — CMP14+EGFR
ALBUMIN: 3.5 g/dL (ref 3.5–4.8)
ALT: 43 IU/L (ref 0–44)
AST: 29 IU/L (ref 0–40)
Albumin/Globulin Ratio: 1.6 (ref 1.2–2.2)
Alkaline Phosphatase: 80 IU/L (ref 39–117)
BUN / CREAT RATIO: 22 (ref 10–22)
BUN: 25 mg/dL (ref 8–27)
Bilirubin Total: 0.6 mg/dL (ref 0.0–1.2)
CALCIUM: 8.5 mg/dL — AB (ref 8.6–10.2)
CO2: 26 mmol/L (ref 18–29)
CREATININE: 1.12 mg/dL (ref 0.76–1.27)
Chloride: 101 mmol/L (ref 96–106)
GFR calc non Af Amer: 63 mL/min/{1.73_m2} (ref >59)
GFR, EST AFRICAN AMERICAN: 73 mL/min/{1.73_m2} (ref >59)
GLOBULIN, TOTAL: 2.2 g/dL (ref 1.5–4.5)
Glucose: 85 mg/dL (ref 65–99)
Potassium: 4.6 mmol/L (ref 3.5–5.2)
SODIUM: 148 mmol/L — AB (ref 134–144)
Total Protein: 5.7 g/dL — ABNORMAL LOW (ref 6.0–8.5)

## 2015-12-16 LAB — CBC WITH DIFFERENTIAL/PLATELET
Basophils Absolute: 0 10*3/uL (ref 0.0–0.2)
Basos: 0 %
EOS (ABSOLUTE): 0.4 10*3/uL (ref 0.0–0.4)
EOS: 2 %
HEMATOCRIT: 32.9 % — AB (ref 37.5–51.0)
HEMOGLOBIN: 10.4 g/dL — AB (ref 12.6–17.7)
IMMATURE GRANULOCYTES: 2 %
Immature Grans (Abs): 0.3 10*3/uL — ABNORMAL HIGH (ref 0.0–0.1)
LYMPHS ABS: 1.1 10*3/uL (ref 0.7–3.1)
Lymphs: 6 %
MCH: 30.7 pg (ref 26.6–33.0)
MCHC: 31.6 g/dL (ref 31.5–35.7)
MCV: 97 fL (ref 79–97)
MONOCYTES: 6 %
Monocytes Absolute: 1.1 10*3/uL — ABNORMAL HIGH (ref 0.1–0.9)
NEUTROS PCT: 84 %
Neutrophils Absolute: 16.1 10*3/uL — ABNORMAL HIGH (ref 1.4–7.0)
Platelets: 585 10*3/uL — ABNORMAL HIGH (ref 150–379)
RBC: 3.39 x10E6/uL — AB (ref 4.14–5.80)
RDW: 14 % (ref 12.3–15.4)
WBC: 19 10*3/uL — AB (ref 3.4–10.8)

## 2015-12-16 LAB — RETICULOCYTES: Retic Ct Pct: 2.5 % (ref 0.6–2.6)

## 2015-12-16 LAB — ERYTHROPOIETIN: Erythropoietin: 34.1 m[IU]/mL — ABNORMAL HIGH (ref 2.6–18.5)

## 2015-12-16 LAB — BRAIN NATRIURETIC PEPTIDE: BNP: 741.2 pg/mL — ABNORMAL HIGH (ref 0.0–100.0)

## 2015-12-16 NOTE — Telephone Encounter (Signed)
Spoke with patient and made him aware of lab results.

## 2015-12-17 ENCOUNTER — Encounter (HOSPITAL_COMMUNITY): Payer: Self-pay | Admitting: Internal Medicine

## 2015-12-17 ENCOUNTER — Telehealth: Payer: Self-pay | Admitting: Family Medicine

## 2015-12-17 NOTE — Telephone Encounter (Signed)
Spoke with wife and appointment scheduled for Friday at 9:25am.

## 2015-12-19 ENCOUNTER — Ambulatory Visit (INDEPENDENT_AMBULATORY_CARE_PROVIDER_SITE_OTHER): Payer: Medicare Other

## 2015-12-19 ENCOUNTER — Ambulatory Visit (INDEPENDENT_AMBULATORY_CARE_PROVIDER_SITE_OTHER): Payer: Medicare Other | Admitting: Family Medicine

## 2015-12-19 ENCOUNTER — Encounter: Payer: Self-pay | Admitting: Family Medicine

## 2015-12-19 VITALS — BP 129/66 | HR 68 | Temp 97.3°F | Ht 70.0 in | Wt 200.0 lb

## 2015-12-19 DIAGNOSIS — Z7901 Long term (current) use of anticoagulants: Secondary | ICD-10-CM

## 2015-12-19 DIAGNOSIS — I482 Chronic atrial fibrillation, unspecified: Secondary | ICD-10-CM

## 2015-12-19 DIAGNOSIS — J189 Pneumonia, unspecified organism: Secondary | ICD-10-CM | POA: Diagnosis not present

## 2015-12-19 DIAGNOSIS — J438 Other emphysema: Secondary | ICD-10-CM | POA: Diagnosis not present

## 2015-12-19 LAB — COAGUCHEK XS/INR WAIVED
INR: 1.5 — AB (ref 0.9–1.1)
Prothrombin Time: 17.5 s

## 2015-12-19 NOTE — Progress Notes (Addendum)
Subjective:  Patient ID: [name] [name], male    DOB: Jun 11, 1941  Age: 75 y.o. MRN: [id number]  CC: Pneumonia   HPI [name] [name] presents for f/u of pneumonia. Cough gone. Feeling better. Using O2 at night and during day prn. Had WBC count of 19 K 4days ago. Finished azithro. Continues suprax   Patient in for follow-up of atrial fibrillation. Patient denies any recent bouts of chest pain or palpitations. Additionally, patient is taking anticoagulants. Patient denies any recent excessive bleeding episodes including epistaxis, bleeding from the gums, genitalia, rectal bleeding or hematuria. Additionally there has been no excessive bruising.  History [name] [name] has a past medical history of Unspecified essential hypertension; Anemia; Asthma; Hepatomegaly; Esophageal reflux; Other and unspecified coagulation defects; Hiatal hernia; Personal history of colonic polyps (05/29/2010); CAD (coronary artery disease); Hypothyroidism; Gout; Hyperlipidemia; Gastric antral vascular ectasia (2013); Atrial fibrillation ([place]); History of blood transfusion (~ 2012); Arthritis; COPD (chronic obstructive pulmonary disease) ([place]); Gallstones; CKD (chronic kidney disease) (2015); and Cholecystitis (11/2013).   [name] [name] has past surgical history that includes Coronary angioplasty with stent (~ 2008; 07/08/2014); Coronary artery bypass graft (1998); Hernia repair; Umbilical hernia repair (1970's); left and right heart catheterization with coronary/graft angiogram (N/A, 07/09/2014); Colonoscopy (2013); Esophagogastroduodenoscopy (2013); Givens capsule study (2013); cholecystomy tube (12/28/2014 - 01/06/2015); and Esophagogastroduodenoscopy (N/A, 12/12/2015).   His family history includes Cancer in his father; Early death in his brother; Heart attack in his brother; Heart disease in his father; Kidney disease in his mother; Leukemia in his mother; Stomach cancer in his sister and sister. There is no history of Colon cancer.[name] [name] reports  that [name] [name] quit smoking about 19 years ago. His smoking use included Cigarettes. [name] [name] has a 42 pack-year smoking history. [name] [name] has never used smokeless tobacco. [name] [name] reports that [name] [name] drinks about 0.6 oz of alcohol per week. [name] [name] reports that [name] [name] does not use illicit drugs.  Current Outpatient Prescriptions on File Prior to Visit  Medication Sig Dispense Refill  . acetaminophen (TYLENOL) 500 MG tablet Take 1,000 mg by mouth every 6 (six) hours as needed for mild pain. For pain.    Marland Kitchen amLODipine (NORVASC) 10 MG tablet Take 1 tablet (10 mg total) by mouth daily. 90 tablet 2  . aspirin EC 81 MG tablet Take 1 tablet (81 mg total) by mouth every Monday, Wednesday, and Friday.    Marland Kitchen azithromycin (ZITHROMAX) 500 MG tablet Take 1 tablet (500 mg total) by mouth daily. 5 tablet 0  . BIDIL 20-37.5 MG tablet Take 1 tablet by mouth 3  times daily 270 tablet 0  . budesonide (PULMICORT) 0.25 MG/2ML nebulizer solution Take 0.25 mg by nebulization 2 (two) times daily.     . Cefixime (SUPRAX) 400 MG CAPS capsule Take 1 capsule (400 mg total) by mouth daily. 5 capsule 0  . Cholecalciferol (VITAMIN D3) 5000 UNITS CAPS Take 5,000 Units by mouth daily.     Marland Kitchen docusate sodium (COLACE) 100 MG capsule Take 100 mg by mouth 2 (two) times daily as needed for mild constipation.    . ferrous sulfate 325 (65 FE) MG tablet Take 325 mg by mouth daily with breakfast.     . furosemide (LASIX) 40 MG tablet Take 1 tablet by mouth  daily 90 tablet 0  . guaiFENesin (MUCINEX) 600 MG 12 hr tablet Take 600 mg by mouth 2 (two) times daily as needed for cough or to loosen phlegm.    Marland Kitchen HYDROcodone-acetaminophen (NORCO) 5-325 MG tablet Take 1 tablet by mouth  every 6 (six) hours as needed for moderate pain. 30 tablet 0  . ipratropium-albuterol (DUONEB) 0.5-2.5 (3) MG/3ML SOLN Take 3 mLs by nebulization every 6 (six) hours as needed. For shortness of breath.     . levothyroxine (SYNTHROID, LEVOTHROID) 112 MCG tablet TAKE 1 TABLET (112 MCG TOTAL) BY MOUTH DAILY  BEFORE BREAKFAST. 90 tablet 3  . lovastatin (MEVACOR) 40 MG tablet Take 1 tablet by mouth at  bedtime (Patient taking differently: Take 1 tablet by mouth in the morning) 90 tablet 0  . Multiple Vitamins-Minerals (MULTIVITAMINS THER. W/MINERALS) TABS Take 1 tablet by mouth daily.      . nitroGLYCERIN (NITROSTAT) 0.4 MG SL tablet Place 0.4 mg under the tongue every 5 (five) minutes as needed for chest pain.    . pantoprazole (PROTONIX) 40 MG tablet Take 1 tablet (40 mg total) by mouth daily. Switch for any other PPI at similar dose and frequency 30 tablet 3  . polyethylene glycol powder (GLYCOLAX/MIRALAX) powder Take 1 capful (17 grams) dissolved in at least 8 ounces water/juice once daily. 527 g 4  . potassium chloride (K-DUR,KLOR-CON) 10 MEQ tablet Take 1 tablet by mouth  daily 90 tablet 0  . PROAIR HFA 108 (90 Base) MCG/ACT inhaler Use 2 puffs every 6 hours  as needed for shortness of  breath 34 g 3  . sucralfate (CARAFATE) 1 g tablet Take 1 tablet (1 g total) by mouth 4 (four) times daily -  with meals and at bedtime. 120 tablet 0  . warfarin (COUMADIN) 5 MG tablet Take 1 tablet by mouth  every day or as directed by anticoagulation clinic (Patient taking differently: Take 1 tablet by mouth Mon, Tues, Wed, Fri, and Sat then takes 0.5 tablet on Thurs and Sun.) 90 tablet 0   No current facility-administered medications on file prior to visit.    ROS Review of Systems  Constitutional: Negative for fever, chills and diaphoresis.  HENT: Negative for rhinorrhea and sore throat.   Respiratory: Negative for cough and shortness of breath.   Cardiovascular: Negative for chest pain.  Gastrointestinal: Negative for abdominal pain.  Musculoskeletal: Negative for myalgias and arthralgias.  Skin: Negative for rash.  Neurological: Negative for weakness and headaches.    Objective:  BP 129/66 mmHg  Pulse 68  Temp(Src) 97.3 F (36.3 C) (Oral)  Ht '5\' 10"'$  (1.778 m)  Wt 200 lb (90.719 kg)  BMI 28.70  kg/m2  Physical Exam  Constitutional: [name] [name] appears well-developed and well-nourished.  HENT:  Head: Normocephalic and atraumatic.  Right Ear: Tympanic membrane and external ear normal. No decreased hearing is noted.  Left Ear: Tympanic membrane and external ear normal. No decreased hearing is noted.  Mouth/Throat: No oropharyngeal exudate or posterior oropharyngeal erythema.  Eyes: Pupils are equal, round, and reactive to light.  Neck: Normal range of motion. Neck supple.  Cardiovascular: Normal rate and regular rhythm.   No murmur heard. Pulmonary/Chest: No respiratory distress. [name] [name] has wheezes (occasional).  Abdominal: Soft. Bowel sounds are normal. [name] [name] exhibits no mass. There is no tenderness.  Vitals reviewed.   Assessment & Plan:   Baley was seen today for pneumonia.  Diagnoses and all orders for this visit:  CAP (community acquired pneumonia) -     DG Chest 2 View; Future -     CBC with Differential/Platelet -     BMP8+EGFR  Other emphysema (HCC) -     CBC with Differential/Platelet -     BMP8+EGFR  Anticoagulant long-term use -  CoaguChek XS/INR Waived  Atrial fibrillation, chronic (HCC) -     CoaguChek XS/INR Waived   I have discontinued [name] [name]. I am also having him maintain his ipratropium-albuterol, budesonide, multivitamins ther. w/minerals, Vitamin D3, acetaminophen, nitroGLYCERIN, ferrous sulfate, aspirin EC, docusate sodium, levothyroxine, amLODipine, HYDROcodone-acetaminophen, warfarin, furosemide, BIDIL, polyethylene glycol powder, PROAIR HFA, lovastatin, guaiFENesin, pantoprazole, Cefixime, azithromycin, sucralfate, and potassium chloride.  Meds ordered this encounter  Medications  . DISCONTD: CARAFATE 1 GM/10ML suspension    Sig:    CXR shows scattered patchy infiltrate. Continue ABX, O2.   Follow-up: Return in about 5 days (around 12/24/2015).  Claretta Fraise, M.D.

## 2015-12-19 NOTE — Addendum Note (Signed)
Addended by: Claretta Fraise on: 12/19/2015 10:21 AM   Modules accepted: Orders, Level of Service

## 2015-12-20 LAB — CBC WITH DIFFERENTIAL/PLATELET
BASOS ABS: 0 10*3/uL (ref 0.0–0.2)
BASOS: 0 %
EOS (ABSOLUTE): 0.5 10*3/uL — AB (ref 0.0–0.4)
Eos: 3 %
Hematocrit: 32.9 % — ABNORMAL LOW (ref 37.5–51.0)
Hemoglobin: 10.3 g/dL — ABNORMAL LOW (ref 12.6–17.7)
IMMATURE GRANS (ABS): 0.1 10*3/uL (ref 0.0–0.1)
Immature Granulocytes: 1 %
LYMPHS: 7 %
Lymphocytes Absolute: 1.1 10*3/uL (ref 0.7–3.1)
MCH: 30.3 pg (ref 26.6–33.0)
MCHC: 31.3 g/dL — ABNORMAL LOW (ref 31.5–35.7)
MCV: 97 fL (ref 79–97)
MONOS ABS: 1.4 10*3/uL — AB (ref 0.1–0.9)
Monocytes: 10 %
NEUTROS ABS: 11.9 10*3/uL — AB (ref 1.4–7.0)
NEUTROS PCT: 79 %
PLATELETS: 494 10*3/uL — AB (ref 150–379)
RBC: 3.4 x10E6/uL — ABNORMAL LOW (ref 4.14–5.80)
RDW: 14 % (ref 12.3–15.4)
WBC: 15 10*3/uL — ABNORMAL HIGH (ref 3.4–10.8)

## 2015-12-20 LAB — BMP8+EGFR
BUN / CREAT RATIO: 14 (ref 10–22)
BUN: 16 mg/dL (ref 8–27)
CHLORIDE: 102 mmol/L (ref 96–106)
CO2: 26 mmol/L (ref 18–29)
Calcium: 8.7 mg/dL (ref 8.6–10.2)
Creatinine, Ser: 1.18 mg/dL (ref 0.76–1.27)
GFR calc non Af Amer: 60 mL/min/{1.73_m2} (ref >59)
GFR, EST AFRICAN AMERICAN: 69 mL/min/{1.73_m2} (ref >59)
GLUCOSE: 90 mg/dL (ref 65–99)
POTASSIUM: 4.7 mmol/L (ref 3.5–5.2)
SODIUM: 144 mmol/L (ref 134–144)

## 2015-12-22 ENCOUNTER — Telehealth: Payer: Self-pay | Admitting: Family Medicine

## 2015-12-22 ENCOUNTER — Other Ambulatory Visit: Payer: Self-pay | Admitting: *Deleted

## 2015-12-22 NOTE — Patient Outreach (Signed)
[facility]) Care Management  12/22/2015  [name] [name] 1940/10/18 [id number]   EMMI-Heart failure referral:  Dashboard red for weight 194 on 03/19; 192 on 3/18.  Telephone call to patient who gave HIPPA verification. Patient voices that weight today 189 lbs. States he has talking scales that were supplied by his insurance company and gets call from nurse at intervals. . States he does not have swelling in ankles, hands or stomach. States if he has swelling it usually occurs in ankles. States he has been advised to take extra 1/2 tablet of furosemide if needed as instructed by his doctor.  Yellow & red zones of heart failure reviewed with patient. He voices understanding.  Patient states he had completed antibiotics that he had for diagnosis of pneumonia. States no fever or coughing episodes since discharge from hospital. States he is using oxygen at 2 liters around the clock. Patient voices that he is trying to gain more strength by walking around in his home. States appetite is not great since he had gallbladder problems last year. States he supplements diet by drinking boost.   Voices that he has seen primary care provider 2 times since hospital stay and has follow up appointment scheduled for 3/22. Also has appointment with cardiologist scheduled for 03/23. Spouse is currently taking patient to all of appointments. Patient voices that he has not had any difficulty getting medications and is taking medications consistently as ordered by doctors. Patient states he is taking his blood pressure with recent readings of 130/70,  140/70.  Dashboard has been addressed.   Plan: Send to care management assistant to close out.  Sherrin Daisy, RN BSN [place] Management Coordinator [facility] Care Management  [phone]

## 2015-12-22 NOTE — Telephone Encounter (Signed)
Patient aware.

## 2015-12-23 ENCOUNTER — Ambulatory Visit: Payer: Medicare Other | Admitting: Family Medicine

## 2015-12-24 ENCOUNTER — Ambulatory Visit (INDEPENDENT_AMBULATORY_CARE_PROVIDER_SITE_OTHER): Payer: Medicare Other

## 2015-12-24 ENCOUNTER — Encounter: Payer: Self-pay | Admitting: Family Medicine

## 2015-12-24 ENCOUNTER — Ambulatory Visit (INDEPENDENT_AMBULATORY_CARE_PROVIDER_SITE_OTHER): Payer: Medicare Other | Admitting: Family Medicine

## 2015-12-24 VITALS — BP 152/70 | HR 81 | Temp 97.8°F | Ht 70.0 in | Wt 186.3 lb

## 2015-12-24 DIAGNOSIS — I10 Essential (primary) hypertension: Secondary | ICD-10-CM

## 2015-12-24 DIAGNOSIS — Z7901 Long term (current) use of anticoagulants: Secondary | ICD-10-CM | POA: Diagnosis not present

## 2015-12-24 DIAGNOSIS — J438 Other emphysema: Secondary | ICD-10-CM

## 2015-12-24 DIAGNOSIS — R7989 Other specified abnormal findings of blood chemistry: Secondary | ICD-10-CM | POA: Diagnosis not present

## 2015-12-24 DIAGNOSIS — I482 Chronic atrial fibrillation, unspecified: Secondary | ICD-10-CM

## 2015-12-24 DIAGNOSIS — J189 Pneumonia, unspecified organism: Secondary | ICD-10-CM | POA: Diagnosis not present

## 2015-12-24 DIAGNOSIS — R945 Abnormal results of liver function studies: Secondary | ICD-10-CM

## 2015-12-24 LAB — COAGUCHEK XS/INR WAIVED
INR: 1.2 — ABNORMAL HIGH (ref 0.9–1.1)
Prothrombin Time: 14.6 s

## 2015-12-24 NOTE — Patient Instructions (Signed)
Anticoagulation Dose Instructions as of 12/24/2015      [name] [name] Tue Wed Thu Fri Sat   New Dose    10 mg 7.5 mg 5 mg 5 mg   Alt Week 2.5 mg 5 mg 5 mg 5 mg 5 mg 5 mg 5 mg    Description        Goal INR 1.8-2.0 Take extra 1 tablet today - March 22nd, then take 1 and 1/2 tablets tomorrow March 23rd.  Then increase dose to 1 tablet daily except 1/2 tablet on sundays

## 2015-12-24 NOTE — Progress Notes (Signed)
Subjective:  Patient ID: [name] [name], male    DOB: 1941/09/21  Age: 75 y.o. MRN: [id number]  CC: CAP   HPI [name] [name] presents for continues to be weak but cough is gone. No dyspnea. Appetite not back, but he is improving - taking boost 2-4 cans a day.   Patient in for follow-up of atrial fibrillation. Patient denies any recent bouts of chest pain or palpitations. Additionally, patient is taking anticoagulants. Patient denies any recent excessive bleeding episodes including epistaxis, bleeding from the gums, genitalia, rectal bleeding or hematuria. Additionally there has been no excessive bruising.   History [name] [name] has a past medical history of Unspecified essential hypertension; Anemia; Asthma; Hepatomegaly; Esophageal reflux; Other and unspecified coagulation defects; Hiatal hernia; Personal history of colonic polyps (05/29/2010); CAD (coronary artery disease); Hypothyroidism; Gout; Hyperlipidemia; Gastric antral vascular ectasia (2013); Atrial fibrillation ([place]); History of blood transfusion (~ 2012); Arthritis; COPD (chronic obstructive pulmonary disease) ([place]); Gallstones; CKD (chronic kidney disease) (2015); and Cholecystitis (11/2013).   He has past surgical history that includes Coronary angioplasty with stent (~ 2008; 07/08/2014); Coronary artery bypass graft (1998); Hernia repair; Umbilical hernia repair (1970's); left and right heart catheterization with coronary/graft angiogram (N/A, 07/09/2014); Colonoscopy (2013); Esophagogastroduodenoscopy (2013); Givens capsule study (2013); cholecystomy tube (12/28/2014 - 01/06/2015); and Esophagogastroduodenoscopy (N/A, 12/12/2015).   His family history includes Cancer in his father; Early death in his brother; Heart attack in his brother; Heart disease in his father; Kidney disease in his mother; Leukemia in his mother; Stomach cancer in his sister and sister. There is no history of Colon cancer.He reports that he quit smoking about 19 years  ago. His smoking use included Cigarettes. He has a 42 pack-year smoking history. He has never used smokeless tobacco. He reports that he drinks about 0.6 oz of alcohol per week. He reports that he does not use illicit drugs.    ROS Review of Systems  Constitutional: Negative for fever, chills and diaphoresis.  HENT: Negative for rhinorrhea and sore throat.   Respiratory: Negative for cough and shortness of breath.   Cardiovascular: Negative for chest pain.  Gastrointestinal: Negative for abdominal pain.  Musculoskeletal: Negative for myalgias and arthralgias.  Skin: Negative for rash.  Neurological: Negative for weakness and headaches.    Objective:  BP 152/70 mmHg  Pulse 81  Temp(Src) 97.8 F (36.6 C) (Oral)  Ht 5\' 10"  (1.778 m)  Wt 186 lb 4.8 oz (84.505 kg)  BMI 26.73 kg/m2  SpO2 95%  BP Readings from Last 3 Encounters:  12/24/15 152/70  12/19/15 129/66  12/15/15 133/56    Wt Readings from Last 3 Encounters:  12/24/15 186 lb 4.8 oz (84.505 kg)  12/19/15 200 lb (90.719 kg)  12/15/15 200 lb (90.719 kg)     Physical Exam  Constitutional: He appears well-developed and well-nourished.  HENT:  Head: Normocephalic and atraumatic.  Right Ear: Tympanic membrane and external ear normal. No decreased hearing is noted.  Left Ear: Tympanic membrane and external ear normal. No decreased hearing is noted.  Mouth/Throat: No oropharyngeal exudate or posterior oropharyngeal erythema.  Eyes: Pupils are equal, round, and reactive to light.  Neck: Normal range of motion. Neck supple.  Cardiovascular: Normal rate and regular rhythm.   No murmur heard. Pulmonary/Chest: Breath sounds normal. No respiratory distress.  Abdominal: Soft. Bowel sounds are normal. He exhibits no mass. There is no tenderness.  Vitals reviewed.    Lab Results  Component Value Date   WBC 15.0* 12/19/2015  HGB 8.6* 12/13/2015   HCT 32.9* 12/19/2015   PLT 494* 12/19/2015   GLUCOSE 90 12/19/2015   CHOL  134 09/24/2015   TRIG 101 09/24/2015   HDL 44 09/24/2015   LDLCALC 70 09/24/2015   ALT 43 12/15/2015   AST 29 12/15/2015   NA 144 12/19/2015   K 4.7 12/19/2015   CL 102 12/19/2015   CREATININE 1.18 12/19/2015   BUN 16 12/19/2015   CO2 26 12/19/2015   TSH 3.410 04/23/2015   PSA 0.78 11/26/2011   INR 1.5* 12/19/2015    Dg Chest 2 View  12/07/2015  CLINICAL DATA:  Cough. Weakness. Fever. Shortness of breath COPD. Asthma. Coronary artery disease. EXAM: CHEST  2 VIEW COMPARISON:  12/25/2014 FINDINGS: Prior median sternotomy. Midline trachea. Moderate cardiomegaly. Mild right hemidiaphragm elevation. No pleural effusion or pneumothorax. Diffuse peribronchial thickening. Left upper lobe patchy airspace disease. IMPRESSION: Left upper lobe airspace disease, most consistent with pneumonia. Followup PA and lateral chest X-ray is recommended in 3-4 weeks following trial of antibiotic therapy to ensure resolution and exclude underlying malignancy. Underlying cardiomegaly with chronic interstitial thickening, likely related to COPD/ chronic bronchitis. Electronically Signed   By: [name] [name] M.D.   On: 12/07/2015 11:40    Assessment & Plan:   [name] [name] was seen today for cap.  Diagnoses and all orders for this visit:  CAP (community acquired pneumonia)  Other emphysema ([place])  Elevated LFTs  Essential hypertension  Atrial fibrillation, chronic (HCC)  Anticoagulant long-term use     I have discontinued [name] azithromycin. I am also having him maintain his ipratropium-albuterol, budesonide, multivitamins ther. w/minerals, Vitamin D3, acetaminophen, nitroGLYCERIN, ferrous sulfate, aspirin EC, docusate sodium, levothyroxine, amLODipine, HYDROcodone-acetaminophen, warfarin, furosemide, BIDIL, polyethylene glycol powder, PROAIR HFA, lovastatin, guaiFENesin, pantoprazole, Cefixime, sucralfate, and potassium chloride.  No orders of the defined types were placed in this encounter.      Follow-up: No Follow-up on file.  [name] [name], M.D.

## 2015-12-25 ENCOUNTER — Other Ambulatory Visit: Payer: Self-pay | Admitting: *Deleted

## 2015-12-25 ENCOUNTER — Encounter: Payer: Self-pay | Admitting: Interventional Cardiology

## 2015-12-25 ENCOUNTER — Ambulatory Visit (INDEPENDENT_AMBULATORY_CARE_PROVIDER_SITE_OTHER): Payer: Medicare Other | Admitting: Interventional Cardiology

## 2015-12-25 VITALS — BP 110/60 | HR 54 | Ht 70.0 in | Wt 185.2 lb

## 2015-12-25 DIAGNOSIS — I25798 Atherosclerosis of other coronary artery bypass graft(s) with other forms of angina pectoris: Secondary | ICD-10-CM | POA: Diagnosis not present

## 2015-12-25 DIAGNOSIS — I482 Chronic atrial fibrillation, unspecified: Secondary | ICD-10-CM

## 2015-12-25 DIAGNOSIS — D500 Iron deficiency anemia secondary to blood loss (chronic): Secondary | ICD-10-CM | POA: Diagnosis not present

## 2015-12-25 DIAGNOSIS — I25708 Atherosclerosis of coronary artery bypass graft(s), unspecified, with other forms of angina pectoris: Secondary | ICD-10-CM

## 2015-12-25 DIAGNOSIS — I10 Essential (primary) hypertension: Secondary | ICD-10-CM

## 2015-12-25 DIAGNOSIS — I5022 Chronic systolic (congestive) heart failure: Secondary | ICD-10-CM | POA: Diagnosis not present

## 2015-12-25 LAB — CBC WITH DIFFERENTIAL/PLATELET
BASOS ABS: 0.1 10*3/uL (ref 0.0–0.2)
Basos: 1 %
EOS (ABSOLUTE): 0.2 10*3/uL (ref 0.0–0.4)
Eos: 2 %
HEMOGLOBIN: 10.5 g/dL — AB (ref 12.6–17.7)
Hematocrit: 32.8 % — ABNORMAL LOW (ref 37.5–51.0)
IMMATURE GRANULOCYTES: 0 %
Immature Grans (Abs): 0 10*3/uL (ref 0.0–0.1)
LYMPHS ABS: 1.3 10*3/uL (ref 0.7–3.1)
LYMPHS: 11 %
MCH: 30.3 pg (ref 26.6–33.0)
MCHC: 32 g/dL (ref 31.5–35.7)
MCV: 95 fL (ref 79–97)
MONOCYTES: 10 %
Monocytes Absolute: 1.1 10*3/uL — ABNORMAL HIGH (ref 0.1–0.9)
NEUTROS PCT: 76 %
Neutrophils Absolute: 9 10*3/uL — ABNORMAL HIGH (ref 1.4–7.0)
Platelets: 440 10*3/uL — ABNORMAL HIGH (ref 150–379)
RBC: 3.46 x10E6/uL — AB (ref 4.14–5.80)
RDW: 14.1 % (ref 12.3–15.4)
WBC: 11.7 10*3/uL — AB (ref 3.4–10.8)

## 2015-12-25 LAB — CMP14+EGFR
ALBUMIN: 3.8 g/dL (ref 3.5–4.8)
ALK PHOS: 67 IU/L (ref 39–117)
ALT: 14 IU/L (ref 0–44)
AST: 19 IU/L (ref 0–40)
Albumin/Globulin Ratio: 1.5 (ref 1.2–2.2)
BUN/Creatinine Ratio: 15 (ref 10–22)
BUN: 19 mg/dL (ref 8–27)
Bilirubin Total: 0.5 mg/dL (ref 0.0–1.2)
CO2: 25 mmol/L (ref 18–29)
CREATININE: 1.26 mg/dL (ref 0.76–1.27)
Calcium: 9 mg/dL (ref 8.6–10.2)
Chloride: 100 mmol/L (ref 96–106)
GFR calc Af Amer: 64 mL/min/{1.73_m2} (ref >59)
GFR calc non Af Amer: 55 mL/min/{1.73_m2} — ABNORMAL LOW (ref >59)
GLUCOSE: 95 mg/dL (ref 65–99)
Globulin, Total: 2.6 g/dL (ref 1.5–4.5)
Potassium: 4.4 mmol/L (ref 3.5–5.2)
Sodium: 143 mmol/L (ref 134–144)
Total Protein: 6.4 g/dL (ref 6.0–8.5)

## 2015-12-25 NOTE — Patient Instructions (Signed)
Medication Instructions:  Your physician recommends that you continue on your current medications as directed. Please refer to the Current Medication list given to you today.   Labwork: None ordered  Testing/Procedures: None ordered  Follow-Up: Your physician wants you to follow-up in: 6 months You will receive a reminder letter in the mail two months in advance. If you don't receive a letter, please call our office to schedule the follow-up appointment.   Any Other Special Instructions Will Be Listed Below (If Applicable). Your physician discussed the importance of regular exercise and recommended that you start or continue a regular exercise program for good health.       If you need a refill on your cardiac medications before your next appointment, please call your pharmacy.  Marland Kitchen

## 2015-12-25 NOTE — Progress Notes (Signed)
Quick Note:  Your chest x-ray looked normal. Thanks, WS. ______ 

## 2015-12-25 NOTE — Patient Outreach (Signed)
[facility]) Care Management  12/25/2015  [place] KYAL SABHARWAL 1940-11-09 [id number]   EMMI-Heart Failure referral due to  red on dashboard for weight gain.   Telephone call to patient who was advised of reason for call. Patient states patient  that he was in [place] office 12/24/2015 when his spouse took call. Spouse reported weight was 199 when actual weight was 186 on 12/24/2015. Per Epic documentation weight recorded at primary care office was 186 pounds on 12/24/2015.  Patient states spouse gave incorrect weight.  States he is not have any swelling or increased shortness of breath. States he continues to use oxygen as directed.   States he has had increase in appetite and continues to drink boost supplements 1-2 times daily. Patient voices that he continues to take medications with no changes. States he verified that he is to start back taking aspirin 81 mg 3 times weekly.  Also he continues on coumadin dosage as directed by his doctor and gets blood draws at primary care office as directed. States he saw cardiologist today and next appointment scheduled for next year.   Patient voices he has not has any coughing spells and no sputum production. He is aware that he needs to weigh daily and call primary care provider if significant weight gain. He has workable scales. Continues to walk for exercise and states is careful. Has had no falls. Has cane to use if needed.  Patient feels he is getting stronger and he is progressing with improvement in appetite.  States he has next appointment with MD next week and has transportation.   EMMI dashboard has been addressed as noted.   Plan: Will send to care management assistant close out.  Sherrin Daisy, RN BSN [place] Management Coordinator [facility] Care Management  [phone]

## 2015-12-25 NOTE — Progress Notes (Signed)
Cardiology Office Note   Date:  12/25/2015   ID:  Redmon, DOB 25-Jan-1941, MRN [id number]  PCP:  Claretta Fraise, MD  Cardiologist:  Sinclair Grooms, MD   Chief Complaint  Patient presents with  . Congestive Heart Failure      History of Present Illness: [name] [name] is a 75 y.o. male who presents for CAD with prior bypass grafting, coronary stenting following bypass surgery, chronic systolic heart failure, atrial fibrillation, chronic anticoagulation therapy, recurrent lower GI bleeding with anemia, cirrhosis, and chronic kidney disease. Patient also has history of COPD/asthma.  Recent hospitalization for significant anemia. Underwent colonoscopy with ablation of bleeding points (question AVM). During hospitalization he had no significant cardiovascular issues. Did not develop heart failure. Cardiac therapy as remained unchanged despite the hospital stay. Coumadin was discontinued for 5 days until the bleeding was controlled.    Past Medical History  Diagnosis Date  . Unspecified essential hypertension   . Anemia   . Asthma   . Hepatomegaly   . Esophageal reflux   . Other and unspecified coagulation defects   . Hiatal hernia   . Personal history of colonic polyps 05/29/2010    TUBULAR ADENOMA  . CAD (coronary artery disease)   . Hypothyroidism   . Gout   . Hyperlipidemia   . Gastric antral vascular ectasia 2013  . Atrial fibrillation ([place])   . History of blood transfusion ~ 2012    "blood count dropped; had to get 3 units"  . Arthritis     "hips; back" (07/09/2014)  . COPD (chronic obstructive pulmonary disease) (HCC)     on home oxygen, 2 liters at night10/2015  . Gallstones   . 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   . Cholecystitis 11/2013    Past Surgical History  Procedure Laterality Date  . Coronary angioplasty with stent placement  ~ 2008; 07/08/2014    "1 + 3"  . 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 Hernia repair    .  Umbilical hernia repair  1970's  . Left and right heart catheterization with coronary/graft angiogram N/A 07/09/2014    Right and left heart cath, bare metal stent to SVG to RCA. Sinclair Grooms, MD;   . Colonoscopy  2013    Dr. Sharlett Iles: no polyps or evidence of active bleeding  . Esophagogastroduodenoscopy  2013    Dr. 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 Givens capsule study  2013    Dr. Sharlett Iles: AVM at 35 and blood at 30 minutes beyond first duodenal image but not actual lesion seen. If persistent IDA, bleeding, recommend enteroscopy with ablation   . Cholecystomy tube  12/28/2014 - 01/06/2015    tube clogged with debris, removed and IR unable to place new tube.   . Esophagogastroduodenoscopy N/A 12/12/2015    Procedure: ESOPHAGOGASTRODUODENOSCOPY (EGD);  Surgeon: Gatha Mayer, MD;  Location: [facility];  Service: Endoscopy;  Laterality: N/A;     Current Outpatient Prescriptions  Medication Sig Dispense Refill  . acetaminophen (TYLENOL) 500 MG tablet Take 1,000 mg by mouth every 6 (six) hours as needed for mild pain. For pain.    Marland Kitchen amLODipine (NORVASC) 10 MG tablet Take 1 tablet (10 mg total) by mouth daily. 90 tablet 2  . aspirin EC 81 MG tablet Take 1 tablet (81 mg total) by mouth every Monday, Wednesday, and Friday.    Marland Kitchen  BIDIL 20-37.5 MG tablet Take 1 tablet by mouth 3  times daily 270 tablet 0  . budesonide (PULMICORT) 0.25 MG/2ML nebulizer solution Take 0.25 mg by nebulization 2 (two) times daily.     . Cefixime (SUPRAX) 400 MG CAPS capsule Take 1 capsule (400 mg total) by mouth daily. 5 capsule 0  . Cholecalciferol (VITAMIN D3) 5000 UNITS CAPS Take 5,000 Units by mouth daily.     Marland Kitchen docusate sodium (COLACE) 100 MG capsule Take 100 mg by mouth 2 (two) times daily as needed for mild constipation.    . ferrous sulfate 325 (65 FE) MG tablet Take 325 mg by mouth daily with breakfast.     . furosemide (LASIX) 40 MG tablet Take 1  tablet by mouth  daily 90 tablet 0  . guaiFENesin (MUCINEX) 600 MG 12 hr tablet Take 600 mg by mouth 2 (two) times daily as needed for cough or to loosen phlegm.    Marland Kitchen HYDROcodone-acetaminophen (NORCO) 5-325 MG tablet Take 1 tablet by mouth every 6 (six) hours as needed for moderate pain. 30 tablet 0  . ipratropium-albuterol (DUONEB) 0.5-2.5 (3) MG/3ML SOLN Take 3 mLs by nebulization every 6 (six) hours as needed. For shortness of breath.     . levothyroxine (SYNTHROID, LEVOTHROID) 112 MCG tablet TAKE 1 TABLET (112 MCG TOTAL) BY MOUTH DAILY BEFORE BREAKFAST. 90 tablet 3  . lovastatin (MEVACOR) 40 MG tablet Take 40 mg by mouth daily.    . Multiple Vitamins-Minerals (MULTIVITAMINS THER. W/MINERALS) TABS Take 1 tablet by mouth daily.      . nitroGLYCERIN (NITROSTAT) 0.4 MG SL tablet Place 0.4 mg under the tongue every 5 (five) minutes as needed for chest pain.    . pantoprazole (PROTONIX) 40 MG tablet Take 1 tablet (40 mg total) by mouth daily. Switch for any other PPI at similar dose and frequency 30 tablet 3  . polyethylene glycol powder (GLYCOLAX/MIRALAX) powder Take 1 capful (17 grams) dissolved in at least 8 ounces water/juice once daily. 527 g 4  . potassium chloride (K-DUR,KLOR-CON) 10 MEQ tablet Take 1 tablet by mouth  daily 90 tablet 0  . PROAIR HFA 108 (90 Base) MCG/ACT inhaler Use 2 puffs every 6 hours  as needed for shortness of  breath 34 g 3  . sucralfate (CARAFATE) 1 g tablet Take 1 tablet (1 g total) by mouth 4 (four) times daily -  with meals and at bedtime. 120 tablet 0  . warfarin (COUMADIN) 5 MG tablet Take as directed by [facility].     No current facility-administered medications for this visit.    Allergies:   Review of patient's allergies indicates no known allergies.    Social History:  The patient  reports that he quit smoking about 19 years ago. His smoking use included Cigarettes. He has a 42 pack-year smoking history. He has never used smokeless tobacco. He reports  that he drinks about 0.6 oz of alcohol per week. He reports that he does not use illicit drugs.   Family History:  The patient's family history includes Cancer in his father; Early death in his brother; Heart attack in his brother; Heart disease in his father; Kidney disease in his mother; Leukemia in his mother; Stomach cancer in his sister and sister. There is no history of Colon cancer.    ROS:  Please see the history of present illness.   Otherwise, review of systems are positive for Dyspnea on exertion, difficulty with balance, anxiety, depression, easy bruising, decreased appetite,  weight loss, irregular heartbeats..   All other systems are reviewed and negative.    PHYSICAL EXAM: VS:  BP 110/60 mmHg  Pulse 54  Ht 5\' 10"  (1.778 m)  Wt 185 lb 3.2 oz (84.006 kg)  BMI 26.57 kg/m2 , BMI Body mass index is 26.57 kg/(m^2). GEN: Well nourished, well developed, in no acute distress HEENT: normal Neck: no JVD, carotid bruits, or masses Cardiac: IIRR.  There is no murmur, rub, or gallop. There is trace edema. Respiratory:  clear to auscultation bilaterally, normal work of breathing. GI: soft, nontender, nondistended, + BS MS: no deformity or atrophy Skin: warm and dry, no rash Neuro:  Strength and sensation are intact Psych: euthymic mood, full affect   EKG:  EKG is not ordered today.    Recent Labs: 04/23/2015: TSH 3.410 12/08/2015: Magnesium 2.3 12/13/2015: Hemoglobin 8.6* 12/15/2015: BNP 741.2* 12/24/2015: ALT 14; BUN 19; Creatinine, Ser 1.26; Platelets 440*; Potassium 4.4; Sodium 143    Lipid Panel    Component Value Date/Time   CHOL 134 09/24/2015 0824   CHOL 138 07/05/2014 0937   TRIG 101 09/24/2015 0824   HDL 44 09/24/2015 0824   HDL 37.60* 07/05/2014 0937   CHOLHDL 3.0 09/24/2015 0824   CHOLHDL 4 07/05/2014 0937   VLDL 29.4 07/05/2014 0937   LDLCALC 70 09/24/2015 0824   LDLCALC 71 07/05/2014 0937      Wt Readings from Last 3 Encounters:  12/25/15 185 lb 3.2 oz  (84.006 kg)  12/24/15 186 lb 4.8 oz (84.505 kg)  12/19/15 200 lb (90.719 kg)      Other studies Reviewed: Additional studies/ records that were reviewed today include: Recent hospitalization.. The findings include discharge summary was closely reviewed..    ASSESSMENT AND PLAN:  1. Chronic systolic heart failure (HCC) No obvious volume overload on exam today.  2. Iron deficiency anemia due to chronic blood loss Hemoglobin done yesterday was greater than 10.5  3. Chronic atrial fibrillation (HCC) Controlled rate  4. 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 Essential hypertension Well controlled    Current medicines are reviewed at length with the patient today.  The patient has the following concerns regarding medicines: None.  The following changes/actions have been instituted:    Aerobic activity as much as tolerated  Watch for evidence of blood in the stool.  Not a candidate for novel oral anticoagulants given inability to reverse anticoagulation if acute bleeding. Should remain on Coumadin therapy.  Labs/ tests ordered today include:  No orders of the defined types were placed in this encounter.     Disposition:   FU with HS in 1 year  Signed, Sinclair Grooms, MD  12/25/2015 11:16 AM    [address], [place], [place]  [address] Phone: [phone]; Fax: [phone]

## 2015-12-27 DIAGNOSIS — J449 Chronic obstructive pulmonary disease, unspecified: Secondary | ICD-10-CM | POA: Diagnosis not present

## 2016-01-01 ENCOUNTER — Ambulatory Visit (INDEPENDENT_AMBULATORY_CARE_PROVIDER_SITE_OTHER): Payer: Medicare Other | Admitting: Pharmacist

## 2016-01-01 DIAGNOSIS — I482 Chronic atrial fibrillation, unspecified: Secondary | ICD-10-CM

## 2016-01-01 DIAGNOSIS — J449 Chronic obstructive pulmonary disease, unspecified: Secondary | ICD-10-CM | POA: Diagnosis not present

## 2016-01-01 LAB — COAGUCHEK XS/INR WAIVED
INR: 1.9 — AB (ref 0.9–1.1)
PROTHROMBIN TIME: 22.6 s

## 2016-01-01 NOTE — Patient Instructions (Signed)
Anticoagulation Dose Instructions as of 01/01/2016      Dorene Grebe Tue Wed Thu Fri Sat   New Dose 5 mg 5 mg 5 mg 5 mg 2.5 mg 5 mg 5 mg    Description        Goal INR 1.8-2.0 Continue with warfarin 5mg  take 1/2 tablet every Thursday and take 1 tablet all other days.     INR was 1.9 today

## 2016-01-07 ENCOUNTER — Ambulatory Visit (INDEPENDENT_AMBULATORY_CARE_PROVIDER_SITE_OTHER): Payer: Medicare Other | Admitting: Family Medicine

## 2016-01-07 ENCOUNTER — Ambulatory Visit (INDEPENDENT_AMBULATORY_CARE_PROVIDER_SITE_OTHER): Payer: Medicare Other

## 2016-01-07 ENCOUNTER — Encounter: Payer: Self-pay | Admitting: Family Medicine

## 2016-01-07 VITALS — BP 130/51 | HR 70 | Temp 97.0°F | Ht 70.0 in | Wt 184.2 lb

## 2016-01-07 DIAGNOSIS — J449 Chronic obstructive pulmonary disease, unspecified: Secondary | ICD-10-CM

## 2016-01-07 DIAGNOSIS — J45909 Unspecified asthma, uncomplicated: Secondary | ICD-10-CM

## 2016-01-07 DIAGNOSIS — Z7901 Long term (current) use of anticoagulants: Secondary | ICD-10-CM

## 2016-01-07 DIAGNOSIS — I10 Essential (primary) hypertension: Secondary | ICD-10-CM | POA: Diagnosis not present

## 2016-01-07 DIAGNOSIS — I482 Chronic atrial fibrillation, unspecified: Secondary | ICD-10-CM

## 2016-01-07 MED ORDER — SUCRALFATE 1 G PO TABS: 1.0000 g | ORAL_TABLET | Freq: Three times a day (TID) | ORAL | Status: DC

## 2016-01-07 NOTE — Progress Notes (Signed)
Subjective:  Patient ID: [name] [name], male    DOB: October 13, 1940  Age: 75 y.o. MRN: [id number]  CC: CAP   HPI Haygen R Joens presents for Eating better. Still weak but improving. Most recent INR is 1.9. Denies cough. Some DOE, did well mowing, but dyspneic with weed eating 2 days ago.    History Aymen has a past medical history of Unspecified essential hypertension; Anemia; Asthma; Hepatomegaly; Esophageal reflux; Other and unspecified coagulation defects; Hiatal hernia; Personal history of colonic polyps (05/29/2010); CAD (coronary artery disease); Hypothyroidism; Gout; Hyperlipidemia; Gastric antral vascular ectasia (2013); Atrial fibrillation ([place]); History of blood transfusion (~ 2012); Arthritis; COPD (chronic obstructive pulmonary disease) ([place]); Gallstones; CKD (chronic kidney disease) (2015); and Cholecystitis (11/2013).   He has past surgical history that includes Coronary angioplasty with stent (~ 2008; 07/08/2014); Coronary artery bypass graft (1998); Hernia repair; Umbilical hernia repair (1970's); left and right heart catheterization with coronary/graft angiogram (N/A, 07/09/2014); Colonoscopy (2013); Esophagogastroduodenoscopy (2013); Givens capsule study (2013); cholecystomy tube (12/28/2014 - 01/06/2015); and Esophagogastroduodenoscopy (N/A, 12/12/2015).   His family history includes Cancer in his father; Early death in his brother; Heart attack in his brother; Heart disease in his father; Kidney disease in his mother; Leukemia in his mother; Stomach cancer in his sister and sister. There is no history of Colon cancer.He reports that he quit smoking about 19 years ago. His smoking use included Cigarettes. He has a 42 pack-year smoking history. He has never used smokeless tobacco. He reports that he drinks about 0.6 oz of alcohol per week. He reports that he does not use illicit drugs.    ROS Review of Systems  Constitutional: Negative for fever, chills, activity change and appetite  change.  HENT: Negative for congestion, ear discharge, ear pain, hearing loss, nosebleeds, postnasal drip, rhinorrhea, sinus pressure, sneezing and trouble swallowing.   Respiratory: Negative for chest tightness and shortness of breath.   Cardiovascular: Negative for chest pain and palpitations.  Skin: Negative for rash.    Objective:  BP 130/51 mmHg  Pulse 70  Temp(Src) 97 F (36.1 C) (Oral)  Ht 5\' 10"  (1.778 m)  Wt 184 lb 3.2 oz (83.553 kg)  BMI 26.43 kg/m2  SpO2 94%  BP Readings from Last 3 Encounters:  01/07/16 130/51  12/25/15 110/60  12/24/15 152/70    Wt Readings from Last 3 Encounters:  01/07/16 184 lb 3.2 oz (83.553 kg)  12/25/15 185 lb 3.2 oz (84.006 kg)  12/24/15 186 lb 4.8 oz (84.505 kg)     Physical Exam  Constitutional: He appears well-developed and well-nourished.  HENT:  Head: Normocephalic and atraumatic.  Right Ear: Tympanic membrane and external ear normal. No decreased hearing is noted.  Left Ear: Tympanic membrane and external ear normal. No decreased hearing is noted.  Mouth/Throat: No oropharyngeal exudate or posterior oropharyngeal erythema.  Eyes: Pupils are equal, round, and reactive to light.  Neck: Normal range of motion. Neck supple.  Cardiovascular: Normal rate and regular rhythm.   No murmur heard. Pulmonary/Chest: Breath sounds normal. No respiratory distress.  Abdominal: Soft. Bowel sounds are normal. He exhibits no mass. There is no tenderness.  Vitals reviewed.    Lab Results  Component Value Date   WBC 11.7* 12/24/2015   HGB 8.6* 12/13/2015   HCT 32.8* 12/24/2015   PLT 440* 12/24/2015   GLUCOSE 95 12/24/2015   CHOL 134 09/24/2015   TRIG 101 09/24/2015   HDL 44 09/24/2015   LDLCALC 70 09/24/2015   ALT 14 12/24/2015  AST 19 12/24/2015   NA 143 12/24/2015   K 4.4 12/24/2015   CL 100 12/24/2015   CREATININE 1.26 12/24/2015   BUN 19 12/24/2015   CO2 25 12/24/2015   TSH 3.410 04/23/2015   PSA 0.78 11/26/2011   INR 1.9*  01/01/2016    Dg Chest 2 View  12/07/2015  CLINICAL DATA:  Cough. Weakness. Fever. Shortness of breath COPD. Asthma. Coronary artery disease. EXAM: CHEST  2 VIEW COMPARISON:  12/25/2014 FINDINGS: Prior median sternotomy. Midline trachea. Moderate cardiomegaly. Mild right hemidiaphragm elevation. No pleural effusion or pneumothorax. Diffuse peribronchial thickening. Left upper lobe patchy airspace disease. IMPRESSION: Left upper lobe airspace disease, most consistent with pneumonia. Followup PA and lateral chest X-ray is recommended in 3-4 weeks following trial of antibiotic therapy to ensure resolution and exclude underlying malignancy. Underlying cardiomegaly with chronic interstitial thickening, likely related to COPD/ chronic bronchitis. Electronically Signed   By: Abigail Miyamoto M.D.   On: 12/07/2015 11:40    Assessment & Plan:   Ramere was seen today for cap.  Diagnoses and all orders for this visit:  Asthma with COPD ([place])  Atrial fibrillation, chronic ([place])  Anticoagulant long-term use  Essential hypertension  Other orders -     sucralfate (CARAFATE) 1 g tablet; Take 1 tablet (1 g total) by mouth 4 (four) times daily -  with meals and at bedtime.    CXR -   I have discontinued Mr. Kalkowski Cefixime. I am also having him maintain his ipratropium-albuterol, budesonide, multivitamins ther. w/minerals, Vitamin D3, acetaminophen, nitroGLYCERIN, ferrous sulfate, aspirin EC, docusate sodium, levothyroxine, amLODipine, HYDROcodone-acetaminophen, furosemide, BIDIL, polyethylene glycol powder, PROAIR HFA, guaiFENesin, pantoprazole, potassium chloride, warfarin, lovastatin, and sucralfate.  Meds ordered this encounter  Medications  . sucralfate (CARAFATE) 1 g tablet    Sig: Take 1 tablet (1 g total) by mouth 4 (four) times daily -  with meals and at bedtime.    Dispense:  120 tablet    Refill:  5     Follow-up: Return in about 3 months (around 04/07/2016), or if symptoms worsen or fail  to improve.  Claretta Fraise, M.D.

## 2016-01-08 ENCOUNTER — Encounter: Payer: Self-pay | Admitting: Pharmacist

## 2016-01-09 ENCOUNTER — Other Ambulatory Visit: Payer: Self-pay | Admitting: Family Medicine

## 2016-01-12 ENCOUNTER — Other Ambulatory Visit: Payer: Self-pay | Admitting: *Deleted

## 2016-01-12 ENCOUNTER — Other Ambulatory Visit: Payer: Self-pay

## 2016-01-12 MED ORDER — SUCRALFATE 1 G PO TABS: 1.0000 g | ORAL_TABLET | Freq: Three times a day (TID) | ORAL | Status: DC

## 2016-01-12 MED ORDER — PANTOPRAZOLE SODIUM 40 MG PO TBEC: 40.0000 mg | DELAYED_RELEASE_TABLET | Freq: Every day | ORAL | Status: DC

## 2016-01-12 MED ORDER — FUROSEMIDE 40 MG PO TABS: ORAL_TABLET | ORAL | Status: DC

## 2016-01-12 MED ORDER — POTASSIUM CHLORIDE CRYS ER 10 MEQ PO TBCR: EXTENDED_RELEASE_TABLET | ORAL | Status: DC

## 2016-01-12 NOTE — Patient Outreach (Signed)
[facility]) Care Management  01/12/2016  [name] [name] 05-11-1941 [id number]     EMMI-HF RED ON EMMI ALERT Day # 27 Date: 01/11/16 Red Alert Reason: " weighed themselves today? No"   Outreach attempt #1 to patient. Patient reached. States he could only talk briefly as he was headed to take wife to rehab. Discussed automated call red alert. Patient states he does not remember answering no the question and that it was an error. He confirmed that he continues to monitor weight daily via use of taking scale provided by [facility]. He reports that weight stays between 189-190 lbs. Weight this am was 190 lbs. He denies any edema, SOB or other symptoms. He reports that he saw cardiologist recently and got a good report. He has started working out at the [employer] (riding bike and treadmill). He has also been physically active doing yard work. Patient reports he has a goal and wants to lose weight and get down to at least 180 lbs. He reports eating better and adhering to diet restrictions. He denies any other needs or concerns at this time. Patient advised that he will continue to get automated calls and if anything triggers red alert he will get a call from a nurse. He voiced understanding and was appreciative of call.   Plan: RN CM will notify [facility] administrative assistant of case closure.   [name] Montgomery, RN,BSN,CCM [place] Management Telephonic Care Management Coordinator Direct Phone: [phone] Toll Free: [phone] Fax: [phone]

## 2016-01-14 ENCOUNTER — Other Ambulatory Visit: Payer: Self-pay

## 2016-01-14 NOTE — Patient Outreach (Signed)
[place] [facility]) Care Management  01/14/2016  [name] [name] 1941/08/09 [id number]      EMMI- HF RED ON EMMI ALERT Day # 29 Date:01/13/16 Red Alert Reason: "Know why to take meds? No" (answered by caregiver)   Outreach attempt #1 to patient. No answer at present. RN CM left HIPAA compliant voicemail message along with contact info.  Plan: RN CM will make outreach call to patient within 24hrs if no response back from patient.   [name], RN,BSN,CCM [place] Management Telephonic Care Management Coordinator Direct Phone: [phone] Toll Free: [phone] Fax: [phone]

## 2016-01-15 ENCOUNTER — Other Ambulatory Visit: Payer: Self-pay

## 2016-01-15 NOTE — Patient Outreach (Signed)
[place] [facility]) Care Management  01/15/2016  [name] [name] Jul 19, 1941 [id number]   EMMI- HF RED ON EMMI ALERT Day # 29 Date:01/13/16 Red Alert Reason: "Know why to take meds? No" (answered by caregiver)   Outreach attempt to patient. Spoke with patient and spouse. She states that they misunderstood automated question and did not mean to answer that way. She denies any issues with meds. No changes in patient. He continues to improve and do well. No further needs or concerns at this time.   Plan: RN CM will notify [facility] administrative assistant of case status.   [name], RN,BSN,CCM [place] Management Telephonic Care Management Coordinator Direct Phone: [phone] Toll Free: [phone] Fax: [phone]

## 2016-01-20 ENCOUNTER — Telehealth: Payer: Self-pay | Admitting: Family Medicine

## 2016-01-22 ENCOUNTER — Other Ambulatory Visit: Payer: Self-pay

## 2016-01-22 ENCOUNTER — Other Ambulatory Visit: Payer: Self-pay | Admitting: Family Medicine

## 2016-01-22 NOTE — Patient Outreach (Signed)
[place] [facility]) Care Management  01/22/2016  Jes TOMAZ GWYNN 12/04/1940 [id number]       EMMI- HF RED ON EMMI ALERT Day # 37 Date: 01/21/16 Red Alert Reason: "filled new prescriptions? No"   Outreach attempt #1 to patient. Spoke with patient. He states that he is doing well. Denies any new issues. Addressed red alert. Patient reports he had ran out of some of his regular meds but was able to get them refilled without any problems. He denies any further issues with meds and voices being compliant with meds. No further RN CM needs at this time.    Plan: RN CM will notify [facility] administrative assistant of case closure status.   [name], RN,BSN,CCM [place] Management Telephonic Care Management Coordinator Direct Phone: [phone] Toll Free: [phone] Fax: [phone]

## 2016-01-27 DIAGNOSIS — J449 Chronic obstructive pulmonary disease, unspecified: Secondary | ICD-10-CM | POA: Diagnosis not present

## 2016-01-28 ENCOUNTER — Other Ambulatory Visit: Payer: Self-pay

## 2016-01-28 NOTE — Patient Outreach (Signed)
[facility]) Care Management  01/28/2016  Santos R Gawlik 1941/04/29 [id number]     EMMI-HF RED ON EMMI ALERT Day # 43 Date:01/27/16 Red Alert Reason: " Filled new prescriptions? No"   Outreach attempt #1 to patient. No answer at present.    Plan: RN CM will make outreach attempt to patient within one business day.   [name], RN,BSN,CCM [place] Management Telephonic Care Management Coordinator Direct Phone: [phone] Toll Free: [phone] Fax: [phone]

## 2016-01-29 ENCOUNTER — Other Ambulatory Visit: Payer: Self-pay

## 2016-01-29 NOTE — Patient Outreach (Signed)
[place] [facility]) Care Management  01/29/2016  [name] [name] Aug 22, 1941 [id number]   EMMI-HF RED ON EMMI ALERT Day # 43 Date:01/27/16 Red Alert Reason: " Filled new prescriptions? No"    Outreach attempt # 2 to patient. Spoke with patient who reported he was doing well. Discussed and addressed red alert on automated EMMI calls. Patient reported that he did not mean to answer that way and it was a mistake. He denies any issues with obtaining and managing his meds at this time. No other RN CM needs or concerns at this as well.    Plan: RN CM will   [name], RN,BSN,CCM [place] Management Telephonic Care Management Coordinator Direct Phone: [phone] Toll Free: [phone] Fax: [phone]

## 2016-01-30 DIAGNOSIS — J449 Chronic obstructive pulmonary disease, unspecified: Secondary | ICD-10-CM | POA: Diagnosis not present

## 2016-02-04 ENCOUNTER — Ambulatory Visit (INDEPENDENT_AMBULATORY_CARE_PROVIDER_SITE_OTHER): Payer: Medicare Other | Admitting: Internal Medicine

## 2016-02-04 ENCOUNTER — Encounter: Payer: Self-pay | Admitting: Internal Medicine

## 2016-02-04 VITALS — BP 138/68 | HR 66 | Ht 70.0 in | Wt 190.0 lb

## 2016-02-04 DIAGNOSIS — D649 Anemia, unspecified: Secondary | ICD-10-CM | POA: Diagnosis not present

## 2016-02-04 DIAGNOSIS — R1013 Epigastric pain: Secondary | ICD-10-CM | POA: Diagnosis not present

## 2016-02-04 DIAGNOSIS — K31819 Angiodysplasia of stomach and duodenum without bleeding: Secondary | ICD-10-CM | POA: Diagnosis not present

## 2016-02-04 DIAGNOSIS — K5900 Constipation, unspecified: Secondary | ICD-10-CM

## 2016-02-04 NOTE — Progress Notes (Signed)
Subjective:    Patient ID: [name] [name], male    DOB: [birth year], 75 y.o.   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 He was last seen on 12/02/2015 and returns today with his wife. He was seen by Dr. Carlean Purl during a recent hospitalization for pneumonia with acute on chronic respiratory failure. This was treated with antibiotics and he has improved. COPD exacerbation. Anemia in the setting of heme-positive stool and supratherapeutic INR. Acute on chronic systolic heart failure and acute on chronic renal injury. Dr. Katha Cabal performed upper endoscopy on 12/12/2015. This revealed GAVE without bleeding along with associated gastritis. The vascular ectasia in the gastric antrum was ablated with APC. The exam was otherwise normal. His PPI has been changed to pantoprazole 40 mg daily and he's been using Carafate 1 g 4 times a day. With this he reports dramatic improvement in his upper and right-sided abdominal pain. He's not had any further issues with reflux. He has not seen black or tarry stools though stools have remained dark on oral iron. He is using MiraLAX after our last visit and has not needed further magnesium citrate. Bowel movements have been more regular. No fevers or chills. He denies chest pain and shortness of breath today. He's been more active recently and enjoys going to his grandson's baseball games.   Review of Systems as per history of present illness, otherwise negative  Current Medications, Allergies, Past Medical History, Past Surgical History, Family History and Social History were reviewed in [employer] record.      Objective:   Physical Exam BP 138/68 mmHg  Pulse 66  Ht 5\' 10"  (1.778 m)  Wt 190 lb (86.183 kg)  BMI 27.26  kg/m2 Constitutional: Well-developed and well-nourished. No distress. HEENT: Normocephalic and atraumatic. Oropharynx is clear and moist. No oropharyngeal exudate. Conjunctivae are normal.  No scleral icterus. Neck: Neck supple. Trachea midline. Cardiovascular: irreg, irreg Pulmonary/chest: Effort normal and breath sounds normal. No wheezing, rales or rhonchi. Abdominal: Soft, nontender, nondistended. Bowel sounds active throughout. There are no masses palpable. No hepatosplenomegaly. Extremities: no clubbing, cyanosis, or edema Lymphadenopathy: No cervical adenopathy noted. Neurological: Alert and oriented to person place and time. Skin: Skin is warm and dry. No rashes noted. Psychiatric: Normal mood and affect. Behavior is normal.  CBC Latest Ref Rng 12/24/2015 12/19/2015 12/15/2015  WBC 3.4 - 10.8 x10E3/uL 11.7(H) 15.0(H) 19.0(H)  Hemoglobin 13.0 - 17.0 g/dL - - -  Hematocrit 37.5 - 51.0 % 32.8(L) 32.9(L) 32.9(L)  Platelets 150 - 379 x10E3/uL 440(H) 494(H) 585(H)    CBC    Component Value Date/Time   WBC 11.7* 12/24/2015 1622   WBC 17.7* 12/13/2015 0536   WBC 11.4* 04/23/2015 0902   RBC 3.46* 12/24/2015 1622   RBC 2.70* 12/13/2015 0536   RBC 2.64* 12/08/2015 1418   RBC 3.76* 04/23/2015 0902   HGB 8.6* 12/13/2015 0536   HGB 12.0* 04/23/2015 0902   HCT 32.8* 12/24/2015 1622   HCT 26.1* 12/13/2015 0536   HCT 325.1* 04/23/2015 0902   PLT 440* 12/24/2015 1622   PLT 488* 12/13/2015 0536   MCV 95 12/24/2015 1622   MCV 96.7 12/13/2015 0536   MCV 93.3 04/23/2015 0902   MCH 30.3 12/24/2015 1622   MCH 31.9 12/13/2015 0536   MCH 31.8*  04/23/2015 0902   MCHC 32.0 12/24/2015 1622   MCHC 33.0 12/13/2015 0536   MCHC 34.1 04/23/2015 0902   RDW 14.1 12/24/2015 1622   RDW 14.2 12/13/2015 0536   LYMPHSABS 1.3 12/24/2015 1622   LYMPHSABS 1.2 10/21/2015 1527   MONOABS 1.6* 10/21/2015 1527   EOSABS 0.2 12/24/2015 1622   EOSABS 0.2 10/21/2015 1527   EOSABS 0.3 10/30/2014 0817   BASOSABS  0.1 12/24/2015 1622   BASOSABS 0.0 10/21/2015 1527    CMP     Component Value Date/Time   NA 143 12/24/2015 1622   NA 139 12/13/2015 0536   K 4.4 12/24/2015 1622   CL 100 12/24/2015 1622   CO2 25 12/24/2015 1622   GLUCOSE 95 12/24/2015 1622   GLUCOSE 104* 12/13/2015 0536   BUN 19 12/24/2015 1622   BUN 44* 12/13/2015 0536   CREATININE 1.26 12/24/2015 1622   CREATININE 1.36* 10/21/2015 1527   CALCIUM 9.0 12/24/2015 1622   PROT 6.4 12/24/2015 1622   PROT 6.6 01/08/2015 1025   ALBUMIN 3.8 12/24/2015 1622   ALBUMIN 3.3* 01/08/2015 1025   AST 19 12/24/2015 1622   ALT 14 12/24/2015 1622   ALKPHOS 67 12/24/2015 1622   BILITOT 0.5 12/24/2015 1622   BILITOT 0.5 01/08/2015 1025   GFRNONAA 55* 12/24/2015 1622   GFRAA 64 12/24/2015 1622        Assessment & Plan:  75 year old male with a past medical history of GAVE with heme-positive stool and iron deficiency anemia, small bowel angioedema ectasia, history of colon polyps, and probable chronic cholecystitis who is here for hospital follow-up.  1. GAVE with heme + stool and anemia -- Status post APC ablation. Upper abdominal pain is also improved likely secondary to increase in PPI and the addition of Carafate. We'll continue pantoprazole 40 mg daily and Carafate before meals and at bedtime for now. Monitor hemoglobin and iron studies going for 3 primary care.  2. Chronic cholecystitis -- low-fat diet recommended. He was not previously a surgical candidate and follows with Dr. Hulen Skains  3. Constipation -- improved with MiraLAX. Continue MiraLAX 17 g daily  6 month follow-up, sooner if necessary 15 minutes spent with the patient today. Greater than 50% was spent in counseling and coordination of care with the patient

## 2016-02-04 NOTE — Patient Instructions (Addendum)
Continue Protonix twice daily.  Continue carafate three times daily before meals and at bedtime.  Follow up with Dr Hilarie Fredrickson in 3-4 months.  If you are age 77 or older, your body mass index should be between 23-30. Your Body mass index is 27.26 kg/(m^2). If this is out of the aforementioned range listed, please consider follow up with your Primary Care Provider.  If you are age 45 or younger, your body mass index should be between 19-25. Your Body mass index is 27.26 kg/(m^2). If this is out of the aformentioned range listed, please consider follow up with your Primary Care Provider.

## 2016-02-09 ENCOUNTER — Ambulatory Visit (INDEPENDENT_AMBULATORY_CARE_PROVIDER_SITE_OTHER): Payer: Medicare Other | Admitting: Pharmacist

## 2016-02-09 DIAGNOSIS — I482 Chronic atrial fibrillation, unspecified: Secondary | ICD-10-CM

## 2016-02-09 DIAGNOSIS — D649 Anemia, unspecified: Secondary | ICD-10-CM

## 2016-02-09 LAB — COAGUCHEK XS/INR WAIVED
INR: 2.5 — ABNORMAL HIGH (ref 0.9–1.1)
PROTHROMBIN TIME: 29.9 s

## 2016-02-09 NOTE — Patient Instructions (Signed)
Anticoagulation Dose Instructions as of 02/09/2016      [name] [name] Tue Wed Thu Fri Sat   New Dose 5 mg 5 mg 2.5 mg 5 mg 2.5 mg 5 mg 5 mg    Description        Goal INR 1.8-2.0 Decrease dose to 1/2 tablet tuesdays and thursdays.  Take 1 tablet all other days     INR was 2.5 today

## 2016-02-10 LAB — CBC WITH DIFFERENTIAL/PLATELET
BASOS: 1 %
Basophils Absolute: 0.1 10*3/uL (ref 0.0–0.2)
EOS (ABSOLUTE): 0.2 10*3/uL (ref 0.0–0.4)
EOS: 2 %
HEMATOCRIT: 29.7 % — AB (ref 37.5–51.0)
HEMOGLOBIN: 9.1 g/dL — AB (ref 12.6–17.7)
IMMATURE GRANULOCYTES: 0 %
Immature Grans (Abs): 0 10*3/uL (ref 0.0–0.1)
Lymphocytes Absolute: 1 10*3/uL (ref 0.7–3.1)
Lymphs: 14 %
MCH: 29.6 pg (ref 26.6–33.0)
MCHC: 30.6 g/dL — ABNORMAL LOW (ref 31.5–35.7)
MCV: 97 fL (ref 79–97)
MONOCYTES: 9 %
MONOS ABS: 0.7 10*3/uL (ref 0.1–0.9)
NEUTROS PCT: 74 %
Neutrophils Absolute: 5.7 10*3/uL (ref 1.4–7.0)
Platelets: 293 10*3/uL (ref 150–379)
RBC: 3.07 x10E6/uL — AB (ref 4.14–5.80)
RDW: 15.1 % (ref 12.3–15.4)
WBC: 7.6 10*3/uL (ref 3.4–10.8)

## 2016-02-11 ENCOUNTER — Telehealth: Payer: Self-pay | Admitting: Pharmacist

## 2016-02-11 ENCOUNTER — Other Ambulatory Visit: Payer: Self-pay | Admitting: Pharmacist

## 2016-02-11 DIAGNOSIS — D649 Anemia, unspecified: Secondary | ICD-10-CM

## 2016-02-11 NOTE — Telephone Encounter (Signed)
Patient called with results of CBC from 02/09/16.  Results were discussed with Dr Livia Snellen.  Hbg was down to 9.1.  Patient instructed to increase iron supplement to bid and RTO for recheck HBG 02/16/16.  Patient aware

## 2016-02-16 ENCOUNTER — Telehealth: Payer: Self-pay | Admitting: Pharmacist

## 2016-02-16 ENCOUNTER — Other Ambulatory Visit (INDEPENDENT_AMBULATORY_CARE_PROVIDER_SITE_OTHER): Payer: Medicare Other

## 2016-02-16 ENCOUNTER — Ambulatory Visit (INDEPENDENT_AMBULATORY_CARE_PROVIDER_SITE_OTHER): Payer: Medicare Other | Admitting: Pharmacist

## 2016-02-16 DIAGNOSIS — I482 Chronic atrial fibrillation, unspecified: Secondary | ICD-10-CM

## 2016-02-16 DIAGNOSIS — D649 Anemia, unspecified: Secondary | ICD-10-CM | POA: Diagnosis not present

## 2016-02-16 LAB — COAGUCHEK XS/INR WAIVED
INR: 1.7 — ABNORMAL HIGH (ref 0.9–1.1)
Prothrombin Time: 20.7 s

## 2016-02-16 LAB — FINGERSTICK HEMOGLOBIN: Hemoglobin: 9.2 g/dL — ABNORMAL LOW (ref 12.6–17.7)

## 2016-02-16 NOTE — Telephone Encounter (Signed)
Left message for patient to continue iron supplement bid.  INR was 1.7 today (slightly subtherapeutic).  Recommeded take 1 and 1/2 tablets today and then resume regular dose of 1 tablet daily except 1/2 tablet on tuesdays and thursdays.  Recheck in 7 to 10 days.

## 2016-02-26 ENCOUNTER — Ambulatory Visit (INDEPENDENT_AMBULATORY_CARE_PROVIDER_SITE_OTHER): Payer: Medicare Other | Admitting: Pharmacist

## 2016-02-26 DIAGNOSIS — D649 Anemia, unspecified: Secondary | ICD-10-CM

## 2016-02-26 DIAGNOSIS — I482 Chronic atrial fibrillation, unspecified: Secondary | ICD-10-CM

## 2016-02-26 DIAGNOSIS — J449 Chronic obstructive pulmonary disease, unspecified: Secondary | ICD-10-CM | POA: Diagnosis not present

## 2016-02-26 LAB — COAGUCHEK XS/INR WAIVED
INR: 1.9 — AB (ref 0.9–1.1)
PROTHROMBIN TIME: 22.8 s

## 2016-02-26 LAB — FINGERSTICK HEMOGLOBIN: HEMOGLOBIN: 9.2 g/dL — AB (ref 12.6–17.7)

## 2016-02-26 NOTE — Patient Instructions (Signed)
Anticoagulation Dose Instructions as of 02/26/2016      Dorene Grebe Tue Wed Thu Fri Sat   New Dose 5 mg 5 mg 2.5 mg 5 mg 2.5 mg 5 mg 5 mg    Description        Goal INR 1.8-2.0 Continue current warfarin dose 1/2 tablet tuesdays and thursdays.  Take 1 tablet all other days     INR was 1.9 today

## 2016-03-01 ENCOUNTER — Other Ambulatory Visit: Payer: Self-pay | Admitting: Family Medicine

## 2016-03-02 DIAGNOSIS — J45998 Other asthma: Secondary | ICD-10-CM | POA: Diagnosis not present

## 2016-03-02 DIAGNOSIS — J449 Chronic obstructive pulmonary disease, unspecified: Secondary | ICD-10-CM | POA: Diagnosis not present

## 2016-03-15 ENCOUNTER — Ambulatory Visit (INDEPENDENT_AMBULATORY_CARE_PROVIDER_SITE_OTHER): Payer: Medicare Other | Admitting: Pharmacist

## 2016-03-15 DIAGNOSIS — I482 Chronic atrial fibrillation, unspecified: Secondary | ICD-10-CM

## 2016-03-15 DIAGNOSIS — D649 Anemia, unspecified: Secondary | ICD-10-CM | POA: Diagnosis not present

## 2016-03-15 LAB — COAGUCHEK XS/INR WAIVED
INR: 1.9 — ABNORMAL HIGH (ref 0.9–1.1)
PROTHROMBIN TIME: 22.5 s

## 2016-03-15 NOTE — Patient Instructions (Signed)
Anticoagulation Dose Instructions as of 03/15/2016      [name] [name] Tue Wed Thu Fri Sat   New Dose 5 mg 5 mg 2.5 mg 5 mg 2.5 mg 5 mg 5 mg    Description        Goal INR 1.8-2.0 Continue current warfarin dose 1/2 tablet tuesdays and thursdays.  Take 1 tablet all other days     INR was 1.9 today

## 2016-03-16 ENCOUNTER — Other Ambulatory Visit: Payer: Self-pay | Admitting: Pharmacist

## 2016-03-16 ENCOUNTER — Encounter: Payer: Self-pay | Admitting: Pharmacist

## 2016-03-16 LAB — CBC WITH DIFFERENTIAL/PLATELET
BASOS ABS: 0.1 10*3/uL (ref 0.0–0.2)
Basos: 1 %
EOS (ABSOLUTE): 0.2 10*3/uL (ref 0.0–0.4)
EOS: 2 %
HEMATOCRIT: 26.9 % — AB (ref 37.5–51.0)
HEMOGLOBIN: 8.8 g/dL — AB (ref 12.6–17.7)
IMMATURE GRANS (ABS): 0 10*3/uL (ref 0.0–0.1)
IMMATURE GRANULOCYTES: 0 %
LYMPHS ABS: 0.9 10*3/uL (ref 0.7–3.1)
LYMPHS: 10 %
MCH: 31.1 pg (ref 26.6–33.0)
MCHC: 32.7 g/dL (ref 31.5–35.7)
MCV: 95 fL (ref 79–97)
MONOCYTES: 13 %
Monocytes Absolute: 1.1 10*3/uL — ABNORMAL HIGH (ref 0.1–0.9)
NEUTROS PCT: 74 %
Neutrophils Absolute: 6.7 10*3/uL (ref 1.4–7.0)
Platelets: 299 10*3/uL (ref 150–379)
RBC: 2.83 x10E6/uL — AB (ref 4.14–5.80)
RDW: 16.1 % — ABNORMAL HIGH (ref 12.3–15.4)
WBC: 9 10*3/uL (ref 3.4–10.8)

## 2016-03-18 ENCOUNTER — Other Ambulatory Visit: Payer: Self-pay | Admitting: Pharmacist

## 2016-03-18 DIAGNOSIS — K31819 Angiodysplasia of stomach and duodenum without bleeding: Secondary | ICD-10-CM

## 2016-03-18 DIAGNOSIS — D500 Iron deficiency anemia secondary to blood loss (chronic): Secondary | ICD-10-CM

## 2016-03-19 ENCOUNTER — Telehealth: Payer: Self-pay | Admitting: Hematology and Oncology

## 2016-03-19 NOTE — Telephone Encounter (Signed)
Lt mess regarding new pt referral appt.

## 2016-03-22 ENCOUNTER — Emergency Department (HOSPITAL_COMMUNITY): Payer: Medicare Other

## 2016-03-22 ENCOUNTER — Encounter (HOSPITAL_COMMUNITY): Payer: Self-pay | Admitting: Emergency Medicine

## 2016-03-22 ENCOUNTER — Observation Stay (HOSPITAL_COMMUNITY)
Admission: EM | Admit: 2016-03-22 | Discharge: 2016-03-25 | Disposition: A | Discharge status: Home / Self Care | Payer: Medicare Other | Attending: Internal Medicine | Admitting: Internal Medicine

## 2016-03-22 ENCOUNTER — Other Ambulatory Visit: Payer: Self-pay

## 2016-03-22 ENCOUNTER — Telehealth: Payer: Self-pay | Admitting: Family Medicine

## 2016-03-22 DIAGNOSIS — R0600 Dyspnea, unspecified: Secondary | ICD-10-CM

## 2016-03-22 DIAGNOSIS — R0609 Other forms of dyspnea: Secondary | ICD-10-CM | POA: Diagnosis present

## 2016-03-22 DIAGNOSIS — Z86010 Personal history of colonic polyps: Secondary | ICD-10-CM | POA: Diagnosis not present

## 2016-03-22 DIAGNOSIS — I2510 Atherosclerotic heart disease of native coronary artery without angina pectoris: Secondary | ICD-10-CM | POA: Insufficient documentation

## 2016-03-22 DIAGNOSIS — I5023 Acute on chronic systolic (congestive) heart failure: Secondary | ICD-10-CM | POA: Insufficient documentation

## 2016-03-22 DIAGNOSIS — J449 Chronic obstructive pulmonary disease, unspecified: Secondary | ICD-10-CM | POA: Diagnosis not present

## 2016-03-22 DIAGNOSIS — N183 Chronic kidney disease, stage 3 unspecified: Secondary | ICD-10-CM | POA: Diagnosis present

## 2016-03-22 DIAGNOSIS — E039 Hypothyroidism, unspecified: Secondary | ICD-10-CM | POA: Insufficient documentation

## 2016-03-22 DIAGNOSIS — Z7982 Long term (current) use of aspirin: Secondary | ICD-10-CM | POA: Insufficient documentation

## 2016-03-22 DIAGNOSIS — R195 Other fecal abnormalities: Secondary | ICD-10-CM

## 2016-03-22 DIAGNOSIS — Z951 Presence of aortocoronary bypass graft: Secondary | ICD-10-CM | POA: Diagnosis not present

## 2016-03-22 DIAGNOSIS — Z800 Family history of malignant neoplasm of digestive organs: Secondary | ICD-10-CM | POA: Diagnosis not present

## 2016-03-22 DIAGNOSIS — Z8719 Personal history of other diseases of the digestive system: Secondary | ICD-10-CM | POA: Diagnosis not present

## 2016-03-22 DIAGNOSIS — Z9981 Dependence on supplemental oxygen: Secondary | ICD-10-CM | POA: Insufficient documentation

## 2016-03-22 DIAGNOSIS — E785 Hyperlipidemia, unspecified: Secondary | ICD-10-CM | POA: Insufficient documentation

## 2016-03-22 DIAGNOSIS — R001 Bradycardia, unspecified: Secondary | ICD-10-CM | POA: Insufficient documentation

## 2016-03-22 DIAGNOSIS — Z7901 Long term (current) use of anticoagulants: Secondary | ICD-10-CM | POA: Diagnosis not present

## 2016-03-22 DIAGNOSIS — K922 Gastrointestinal hemorrhage, unspecified: Secondary | ICD-10-CM | POA: Insufficient documentation

## 2016-03-22 DIAGNOSIS — M160 Bilateral primary osteoarthritis of hip: Secondary | ICD-10-CM | POA: Insufficient documentation

## 2016-03-22 DIAGNOSIS — R778 Other specified abnormalities of plasma proteins: Secondary | ICD-10-CM | POA: Insufficient documentation

## 2016-03-22 DIAGNOSIS — I5022 Chronic systolic (congestive) heart failure: Secondary | ICD-10-CM

## 2016-03-22 DIAGNOSIS — D509 Iron deficiency anemia, unspecified: Secondary | ICD-10-CM | POA: Diagnosis present

## 2016-03-22 DIAGNOSIS — Z7951 Long term (current) use of inhaled steroids: Secondary | ICD-10-CM | POA: Diagnosis not present

## 2016-03-22 DIAGNOSIS — I482 Chronic atrial fibrillation, unspecified: Secondary | ICD-10-CM | POA: Diagnosis present

## 2016-03-22 DIAGNOSIS — K219 Gastro-esophageal reflux disease without esophagitis: Secondary | ICD-10-CM | POA: Insufficient documentation

## 2016-03-22 DIAGNOSIS — Z79899 Other long term (current) drug therapy: Secondary | ICD-10-CM | POA: Diagnosis not present

## 2016-03-22 DIAGNOSIS — M479 Spondylosis, unspecified: Secondary | ICD-10-CM | POA: Diagnosis not present

## 2016-03-22 DIAGNOSIS — Z8249 Family history of ischemic heart disease and other diseases of the circulatory system: Secondary | ICD-10-CM | POA: Insufficient documentation

## 2016-03-22 DIAGNOSIS — Z87891 Personal history of nicotine dependence: Secondary | ICD-10-CM | POA: Diagnosis not present

## 2016-03-22 DIAGNOSIS — Z955 Presence of coronary angioplasty implant and graft: Secondary | ICD-10-CM | POA: Insufficient documentation

## 2016-03-22 DIAGNOSIS — R0602 Shortness of breath: Secondary | ICD-10-CM | POA: Diagnosis not present

## 2016-03-22 DIAGNOSIS — K31819 Angiodysplasia of stomach and duodenum without bleeding: Secondary | ICD-10-CM | POA: Diagnosis present

## 2016-03-22 DIAGNOSIS — I130 Hypertensive heart and chronic kidney disease with heart failure and stage 1 through stage 4 chronic kidney disease, or unspecified chronic kidney disease: Secondary | ICD-10-CM | POA: Diagnosis not present

## 2016-03-22 DIAGNOSIS — Z79891 Long term (current) use of opiate analgesic: Secondary | ICD-10-CM | POA: Insufficient documentation

## 2016-03-22 DIAGNOSIS — I10 Essential (primary) hypertension: Secondary | ICD-10-CM | POA: Diagnosis present

## 2016-03-22 LAB — COMPREHENSIVE METABOLIC PANEL
ALT: 15 U/L — ABNORMAL LOW (ref 17–63)
AST: 20 U/L (ref 15–41)
Albumin: 4.8 g/dL (ref 3.5–5.0)
Alkaline Phosphatase: 59 U/L (ref 38–126)
Anion gap: 10 (ref 5–15)
BUN: 25 mg/dL — ABNORMAL HIGH (ref 6–20)
CHLORIDE: 105 mmol/L (ref 101–111)
CO2: 26 mmol/L (ref 22–32)
Calcium: 9.2 mg/dL (ref 8.9–10.3)
Creatinine, Ser: 1.41 mg/dL — ABNORMAL HIGH (ref 0.61–1.24)
GFR, EST AFRICAN AMERICAN: 54 mL/min — AB (ref >60)
GFR, EST NON AFRICAN AMERICAN: 47 mL/min — AB (ref >60)
Glucose, Bld: 104 mg/dL — ABNORMAL HIGH (ref 65–99)
POTASSIUM: 4.2 mmol/L (ref 3.5–5.1)
SODIUM: 141 mmol/L (ref 135–145)
Total Bilirubin: 1.3 mg/dL — ABNORMAL HIGH (ref 0.3–1.2)
Total Protein: 7.8 g/dL (ref 6.5–8.1)

## 2016-03-22 LAB — CBC WITH DIFFERENTIAL/PLATELET
BASOS ABS: 0.1 10*3/uL (ref 0.0–0.1)
BASOS PCT: 1 %
EOS PCT: 2 %
Eosinophils Absolute: 0.2 10*3/uL (ref 0.0–0.7)
HEMATOCRIT: 30.2 % — AB (ref 39.0–52.0)
Hemoglobin: 9.8 g/dL — ABNORMAL LOW (ref 13.0–17.0)
Lymphocytes Relative: 8 %
Lymphs Abs: 0.9 10*3/uL (ref 0.7–4.0)
MCH: 30.9 pg (ref 26.0–34.0)
MCHC: 32.5 g/dL (ref 30.0–36.0)
MCV: 95.3 fL (ref 78.0–100.0)
MONO ABS: 1.1 10*3/uL — AB (ref 0.1–1.0)
Monocytes Relative: 10 %
NEUTROS ABS: 8.4 10*3/uL — AB (ref 1.7–7.7)
Neutrophils Relative %: 79 %
PLATELETS: 342 10*3/uL (ref 150–400)
RBC: 3.17 MIL/uL — AB (ref 4.22–5.81)
RDW: 15.7 % — AB (ref 11.5–15.5)
WBC: 10.6 10*3/uL — AB (ref 4.0–10.5)

## 2016-03-22 LAB — IRON AND TIBC
IRON: 156 ug/dL (ref 45–182)
SATURATION RATIOS: 35 % (ref 17.9–39.5)
TIBC: 445 ug/dL (ref 250–450)
UIBC: 289 ug/dL

## 2016-03-22 LAB — PROTIME-INR
INR: 2 — AB (ref 0.00–1.49)
PROTHROMBIN TIME: 21.9 s — AB (ref 11.6–15.2)

## 2016-03-22 LAB — BRAIN NATRIURETIC PEPTIDE: B NATRIURETIC PEPTIDE 5: 520.5 pg/mL — AB (ref 0.0–100.0)

## 2016-03-22 LAB — TROPONIN I
TROPONIN I: 0.06 ng/mL — AB (ref <0.031)
TROPONIN I: 0.06 ng/mL — AB (ref <0.031)

## 2016-03-22 LAB — TYPE AND SCREEN
ABO/RH(D): A POS
ANTIBODY SCREEN: NEGATIVE

## 2016-03-22 LAB — TRANSFERRIN: Transferrin: 318 mg/dL (ref 180–329)

## 2016-03-22 LAB — POC OCCULT BLOOD, ED: FECAL OCCULT BLD: POSITIVE — AB

## 2016-03-22 MED ORDER — ACETAMINOPHEN 650 MG RE SUPP: 650.0000 mg | Freq: Four times a day (QID) | RECTAL | Status: DC | PRN

## 2016-03-22 MED ORDER — SUCRALFATE 1 G PO TABS
1.0000 g | ORAL_TABLET | Freq: Three times a day (TID) | ORAL | Status: DC
  Administered 2016-03-22 – 2016-03-25 (×11): 1 g via ORAL
  Filled 2016-03-22 (×16): qty 1

## 2016-03-22 MED ORDER — ALBUTEROL SULFATE (2.5 MG/3ML) 0.083% IN NEBU
2.5000 mg | INHALATION_SOLUTION | RESPIRATORY_TRACT | Status: DC | PRN
  Administered 2016-03-23: 2.5 mg via RESPIRATORY_TRACT
  Filled 2016-03-22: qty 3

## 2016-03-22 MED ORDER — FUROSEMIDE 10 MG/ML IJ SOLN
40.0000 mg | Freq: Two times a day (BID) | INTRAMUSCULAR | Status: DC
  Administered 2016-03-23 – 2016-03-24 (×3): 40 mg via INTRAVENOUS
  Filled 2016-03-22 (×3): qty 4

## 2016-03-22 MED ORDER — PRAVASTATIN SODIUM 40 MG PO TABS
40.0000 mg | ORAL_TABLET | Freq: Every day | ORAL | Status: DC
  Administered 2016-03-23 – 2016-03-24 (×2): 40 mg via ORAL
  Filled 2016-03-22 (×2): qty 1

## 2016-03-22 MED ORDER — SODIUM CHLORIDE 0.9% FLUSH
3.0000 mL | Freq: Two times a day (BID) | INTRAVENOUS | Status: DC
Start: 2016-03-22 — End: 2016-03-25
  Administered 2016-03-22 – 2016-03-25 (×6): 3 mL via INTRAVENOUS

## 2016-03-22 MED ORDER — ONDANSETRON HCL 4 MG/2ML IJ SOLN
4.0000 mg | Freq: Four times a day (QID) | INTRAMUSCULAR | Status: DC | PRN
  Administered 2016-03-22: 4 mg via INTRAVENOUS
  Filled 2016-03-22: qty 2

## 2016-03-22 MED ORDER — NITROGLYCERIN 0.4 MG SL SUBL: 0.4000 mg | SUBLINGUAL_TABLET | SUBLINGUAL | Status: DC | PRN

## 2016-03-22 MED ORDER — ONDANSETRON HCL 4 MG PO TABS: 4.0000 mg | ORAL_TABLET | Freq: Four times a day (QID) | ORAL | Status: DC | PRN

## 2016-03-22 MED ORDER — FUROSEMIDE 10 MG/ML IJ SOLN
40.0000 mg | Freq: Once | INTRAMUSCULAR | Status: AC
  Administered 2016-03-22: 40 mg via INTRAVENOUS
  Filled 2016-03-22: qty 4

## 2016-03-22 MED ORDER — PANTOPRAZOLE SODIUM 40 MG PO TBEC
40.0000 mg | DELAYED_RELEASE_TABLET | Freq: Every day | ORAL | Status: DC
  Administered 2016-03-22 – 2016-03-25 (×4): 40 mg via ORAL
  Filled 2016-03-22 (×4): qty 1

## 2016-03-22 MED ORDER — IPRATROPIUM-ALBUTEROL 0.5-2.5 (3) MG/3ML IN SOLN
3.0000 mL | Freq: Four times a day (QID) | RESPIRATORY_TRACT | Status: DC
  Administered 2016-03-22: 3 mL via RESPIRATORY_TRACT
  Filled 2016-03-22: qty 3

## 2016-03-22 MED ORDER — THERA M PLUS PO TABS: 1.0000 | ORAL_TABLET | Freq: Every day | ORAL | Status: DC

## 2016-03-22 MED ORDER — ACETAMINOPHEN 325 MG PO TABS: 650.0000 mg | ORAL_TABLET | Freq: Four times a day (QID) | ORAL | Status: DC | PRN

## 2016-03-22 MED ORDER — FAMOTIDINE IN NACL 20-0.9 MG/50ML-% IV SOLN
20.0000 mg | Freq: Two times a day (BID) | INTRAVENOUS | Status: DC
  Filled 2016-03-22: qty 50

## 2016-03-22 MED ORDER — LEVOTHYROXINE SODIUM 112 MCG PO TABS
112.0000 ug | ORAL_TABLET | Freq: Every day | ORAL | Status: DC
  Administered 2016-03-23 – 2016-03-25 (×3): 112 ug via ORAL
  Filled 2016-03-22 (×3): qty 1

## 2016-03-22 MED ORDER — IPRATROPIUM-ALBUTEROL 0.5-2.5 (3) MG/3ML IN SOLN
3.0000 mL | Freq: Three times a day (TID) | RESPIRATORY_TRACT | Status: DC
  Administered 2016-03-23 – 2016-03-25 (×7): 3 mL via RESPIRATORY_TRACT
  Filled 2016-03-22 (×7): qty 3

## 2016-03-22 MED ORDER — BUDESONIDE 0.25 MG/2ML IN SUSP
0.2500 mg | Freq: Two times a day (BID) | RESPIRATORY_TRACT | Status: DC
  Administered 2016-03-22 – 2016-03-25 (×6): 0.25 mg via RESPIRATORY_TRACT
  Filled 2016-03-22 (×6): qty 2

## 2016-03-22 MED ORDER — FERROUS SULFATE 325 (65 FE) MG PO TABS
325.0000 mg | ORAL_TABLET | Freq: Two times a day (BID) | ORAL | Status: DC
  Administered 2016-03-22 – 2016-03-25 (×6): 325 mg via ORAL
  Filled 2016-03-22 (×6): qty 1

## 2016-03-22 MED ORDER — ADULT MULTIVITAMIN W/MINERALS CH
1.0000 | ORAL_TABLET | Freq: Every day | ORAL | Status: DC
  Administered 2016-03-23 – 2016-03-25 (×3): 1 via ORAL
  Filled 2016-03-22 (×3): qty 1

## 2016-03-22 MED ORDER — FUROSEMIDE 40 MG PO TABS: 40.0000 mg | ORAL_TABLET | Freq: Every day | ORAL | Status: DC

## 2016-03-22 MED ORDER — SODIUM CHLORIDE 0.9 % IV SOLN
INTRAVENOUS | Status: DC
  Administered 2016-03-22: 10 mL/h via INTRAVENOUS

## 2016-03-22 MED ORDER — SODIUM CHLORIDE 0.9% FLUSH: 3.0000 mL | INTRAVENOUS | Status: DC | PRN

## 2016-03-22 MED ORDER — ISOSORB DINITRATE-HYDRALAZINE 20-37.5 MG PO TABS
1.0000 | ORAL_TABLET | Freq: Three times a day (TID) | ORAL | Status: DC
  Administered 2016-03-22 – 2016-03-25 (×9): 1 via ORAL
  Filled 2016-03-22 (×9): qty 1

## 2016-03-22 MED ORDER — SODIUM CHLORIDE 0.9 % IV SOLN: 250.0000 mL | INTRAVENOUS | Status: DC | PRN

## 2016-03-22 MED ORDER — HYDROCODONE-ACETAMINOPHEN 5-325 MG PO TABS: 1.0000 | ORAL_TABLET | Freq: Four times a day (QID) | ORAL | Status: DC | PRN

## 2016-03-22 NOTE — Consult Note (Signed)
Admit date: 03/22/2016 Referring Physician  Dr. Tyrell Antonio Primary Physician Claretta Fraise, MD Primary Cardiologist  Dr. Tamala Julian Reason for Consultation  Elevated troponin, CHF  HPI: 75 -year-old with history of coronary artery disease status post bypass surgery with chronic systolic heart failure, atrial fibrillation, history of GI bleeding, cirrhosis, chronic kidney disease here with 3-4 day history of shortness of breath despite increased hemoglobin. Continues with chronic Coumadin. Has COPD as well on 2 L of oxygen at home. Uses it at bedtime.  INR is 2, hemoglobin 9.8, creatinine 1.4 heme positive.  Troponin was 0.06, mildly elevated and BNP was 520 also elevated. Prior BNP was 741 on 12/15/15.  Last ejection fraction 30%. Lasix IV has been administered 1. Repeating echocardiogram.   Denies any chest pain. Just felt "tight" from his stomach to his chest. He has increased work of breathing. No arm pain, no vomiting.    PMH:   Past Medical History  Diagnosis Date  . Unspecified essential hypertension   . Anemia   . Asthma   . Hepatomegaly   . Esophageal reflux   . Other and unspecified coagulation defects   . Hiatal hernia   . Personal history of colonic polyps 05/29/2010    TUBULAR ADENOMA  . CAD (coronary artery disease)   . Hypothyroidism   . Gout   . Hyperlipidemia   . Gastric antral vascular ectasia 2013  . Atrial fibrillation ([place])   . History of blood transfusion ~ 2012    "blood count dropped; had to get 3 units"  . Arthritis     "hips; back" (07/09/2014)  . COPD (chronic obstructive pulmonary disease) ([facility])     on home oxygen, 2 liters at night10/2015  . Gallstones   . 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   . Cholecystitis 11/2013    PSH:   Past Surgical History  Procedure Laterality Date  . Coronary angioplasty with stent placement  ~ 2008; 07/08/2014    "1 + 3"  . 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 Hernia repair    . Umbilical  hernia repair  1970's  . Left and right heart catheterization with coronary/graft angiogram N/A 07/09/2014    Right and left heart cath, bare metal stent to SVG to RCA. Sinclair Grooms, MD;   . Colonoscopy  2013    Dr. Sharlett Iles: no polyps or evidence of active bleeding  . Esophagogastroduodenoscopy  2013    Dr. 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 Givens capsule study  2013    Dr. Sharlett Iles: AVM at 62 and blood at 30 minutes beyond first duodenal image but not actual lesion seen. If persistent IDA, bleeding, recommend enteroscopy with ablation   . Cholecystomy tube  12/28/2014 - 01/06/2015    tube clogged with debris, removed and IR unable to place new tube.   . Esophagogastroduodenoscopy N/A 12/12/2015    Procedure: ESOPHAGOGASTRODUODENOSCOPY (EGD);  Surgeon: Gatha Mayer, MD;  Location: [facility];  Service: Endoscopy;  Laterality: N/A;   Allergies:  Review of patient's allergies indicates no known allergies. Prior to Admit Meds:   Prior to Admission medications   Medication Sig Start Date End Date Taking? Authorizing Provider  acetaminophen (TYLENOL) 500 MG tablet Take 1,000 mg by mouth every 6 (six) hours as needed for mild pain. For pain.   Yes Historical Provider, MD  amLODipine (NORVASC) 10 MG tablet Take 1  tablet (10 mg total) by mouth daily. 10/28/15  Yes Belva Crome, MD  aspirin EC 81 MG tablet Take 1 tablet (81 mg total) by mouth every Monday, Wednesday, and Friday. 10/08/14  Yes Belva Crome, MD  BIDIL 20-37.5 MG tablet Take 1 tablet by mouth 3  times daily 11/24/15  Yes Claretta Fraise, MD  budesonide (PULMICORT) 0.25 MG/2ML nebulizer solution Take 0.25 mg by nebulization 2 (two) times daily.    Yes Historical Provider, MD  Cholecalciferol (VITAMIN D3) 5000 UNITS CAPS Take 5,000 Units by mouth daily.    Yes Historical Provider, MD  docusate sodium (COLACE) 100 MG capsule Take 100 mg by mouth 2 (two) times daily as needed for mild  constipation.   Yes Historical Provider, MD  ferrous sulfate 325 (65 FE) MG tablet Take 325 mg by mouth 2 (two) times daily.   Yes Historical Provider, MD  furosemide (LASIX) 40 MG tablet Take 1 tablet by mouth  daily Patient taking differently: Take 40 mg by mouth daily.  01/12/16  Yes Claretta Fraise, MD  HYDROcodone-acetaminophen (NORCO) 5-325 MG tablet Take 1 tablet by mouth every 6 (six) hours as needed for moderate pain. 11/02/15  Yes Claretta Fraise, MD  ipratropium-albuterol (DUONEB) 0.5-2.5 (3) MG/3ML SOLN Take 3 mLs by nebulization every 6 (six) hours as needed. For shortness of breath.    Yes Historical Provider, MD  levothyroxine (SYNTHROID, LEVOTHROID) 112 MCG tablet Take 1 tablet by mouth  daily before breakfast Patient taking differently: Take 112 mcg by mouth  daily before breakfast 01/12/16  Yes Claretta Fraise, MD  lovastatin (MEVACOR) 40 MG tablet Take 1 tablet by mouth at  bedtime Patient taking differently: Take 40 mg by mouth at  bedtime 01/22/16  Yes Claretta Fraise, MD  Multiple Vitamins-Minerals (MULTIVITAMINS THER. W/MINERALS) TABS Take 1 tablet by mouth daily.     Yes Historical Provider, MD  nitroGLYCERIN (NITROSTAT) 0.4 MG SL tablet Place 0.4 mg under the tongue every 5 (five) minutes as needed for chest pain.   Yes Historical Provider, MD  pantoprazole (PROTONIX) 40 MG tablet TAKE 1 TABLET BY MOUTH  DAILY Patient taking differently: TAKE 40 mg BY MOUTH  DAILY 03/02/16  Yes Claretta Fraise, MD  polyethylene glycol powder (GLYCOLAX/MIRALAX) powder Take 1 capful (17 grams) dissolved in at least 8 ounces water/juice once daily. 12/02/15  Yes Jerene Bears, MD  potassium chloride (K-DUR,KLOR-CON) 10 MEQ tablet Take 1 tablet by mouth  daily Patient taking differently: Take 10 meq by mouth  daily 03/02/16  Yes Claretta Fraise, MD  [facility] HFA 108 [phone] Base) MCG/ACT inhaler Use 2 puffs every 6 hours  as needed for shortness of  breath 12/04/15  Yes Claretta Fraise, MD  sucralfate (CARAFATE) 1 g tablet  Take 1 tablet (1 g total) by mouth 4 (four) times daily -  with meals and at bedtime. 01/12/16  Yes Claretta Fraise, MD  warfarin (COUMADIN) 5 MG tablet Take 1 tablet by mouth  every day or as directed by anticoagulation clinic Patient taking differently: Take 5 mg by mouth daily ecxcept tuesday & thursday take 2.5 mg 01/12/16  Yes Claretta Fraise, MD   Fam HX:    Family History  Problem Relation Age of Onset  . Leukemia Mother   . Kidney disease Mother     kidney removed   . Colon cancer Neg Hx   . Heart attack Brother   . Hypertension      family   . Cancer Father   .  Heart disease Father   . Stomach cancer Sister   . Stomach cancer Sister   . Early death Brother    Social HX:    Social History   Social History  . Marital Status: Married    Spouse Name: [name]  . Number of Children: 2  . Years of Education: N/A   Occupational History  . retired    Social History Main Topics  . Smoking status: Former Smoker -- 1.00 packs/day for 42 years    Types: Cigarettes    Quit date: 12/16/1996  . Smokeless tobacco: Never Used  . Alcohol Use: 0.6 oz/week    1 Cans of beer per week     Comment: 07/09/2014 "averages < 1 beer/wk", 11/26/14: denies ETOH  . Drug Use: No  . Sexual Activity: No   Other Topics Concern  . Not on file   Social History Narrative     ROS:  All 11 ROS were addressed and are negative except what is stated in the HPI   Physical Exam: Blood pressure 144/61, pulse 70, temperature 97.8 F (36.6 C), temperature source Oral, resp. rate 18, SpO2 98 %.   General: Well developed, well nourished, in no acute distress Head: Eyes PERRLA, No xanthomas.   Normal cephalic and atramatic  Lungs:   Poor air movement B, no active wheeze, increased use of accessory muscles.  Heart:   HRRR S1 S2 Pulses are 2+ & equal. No murmur, rubs, gallops.  No carotid bruit. No JVD.  No abdominal bruits.  Abdomen: Bowel sounds are positive, abdomen soft and non-tender without masses. No  hepatosplenomegaly. Msk:  Back normal. Normal strength and tone for age. Extremities:  No clubbing, cyanosis or edema.  DP +1 Neuro: Alert and oriented X 3, non-focal, MAE x 4 GU: Deferred Rectal: Deferred Psych:  Good affect, responds appropriately      Labs: Lab Results  Component Value Date   WBC 10.6* 03/22/2016   HGB 9.8* 03/22/2016   HCT 30.2* 03/22/2016   MCV 95.3 03/22/2016   PLT 342 03/22/2016     Recent Labs Lab 03/22/16 1333  NA 141  K 4.2  CL 105  CO2 26  BUN 25*  CREATININE 1.41*  CALCIUM 9.2  PROT 7.8  BILITOT 1.3*  ALKPHOS 59  ALT 15*  AST 20  GLUCOSE 104*    Recent Labs  03/22/16 1333  TROPONINI 0.06*   Lab Results  Component Value Date   CHOL 134 09/24/2015   HDL 44 09/24/2015   LDLCALC 70 09/24/2015   TRIG 101 09/24/2015   No results found for: DDIMER   Radiology:  Dg Chest 2 View  03/22/2016  CLINICAL DATA:  Shortness of breath.  COPD. EXAM: CHEST  2 VIEW COMPARISON:  12/12/2015 FINDINGS: Mild enlargement of the cardiopericardial silhouette with indistinct pulmonary vasculature. No Kerley B-lines. Prior CABG. Atherosclerotic aortic arch. Small bilateral pleural effusions with passive atelectasis. Coronary stents noted. IMPRESSION: 1. Mild enlargement of the cardiopericardial silhouette with evidence of pulmonary venous hypertension but no overt edema. 2. Small bilateral pleural effusions. 3. Prior CABG and coronary stents. 4. Atherosclerotic ascending aorta and aortic arch. Electronically Signed   By: Van Clines M.D.   On: 03/22/2016 14:20   Personally viewed.  EKG:  03/22/16-atrial fibrillation, right bundle branch block, chronic Personally viewed.   Scheduled Meds: . budesonide  0.25 mg Nebulization BID  . ferrous sulfate  325 mg Oral BID  . furosemide  40 mg Intravenous BID  .  ipratropium-albuterol  3 mL Nebulization Q6H  . isosorbide-hydrALAZINE  1 tablet Oral TID  . [START ON 03/23/2016] levothyroxine  112 mcg Oral QAC  breakfast  . [START ON 03/23/2016] multivitamin with minerals  1 tablet Oral Daily  . pantoprazole  40 mg Oral Daily  . [START ON 03/23/2016] pravastatin  40 mg Oral q1800  . sodium chloride flush  3 mL Intravenous Q12H  . sucralfate  1 g Oral TID WC & HS   Continuous Infusions: . sodium chloride 10 mL/hr (03/22/16 1328)   PRN Meds:.sodium chloride, acetaminophen **OR** acetaminophen, albuterol, HYDROcodone-acetaminophen, nitroGLYCERIN, ondansetron **OR** ondansetron (ZOFRAN) IV, sodium chloride flush   ASSESSMENT/PLAN:    Atrial fibrillation  - Holding Coumadin in case bleeding, procedure  - Would advocate for Coumadin only (no aspirin) once able to restart to reduce bleeding risk.   Acute on Chronic systolic heart failure  - IV Lasix has been administered. It would not be unreasonable to continue with scheduled IV Lasix twice a day. Will write for this.   - Watch Creat.   Elevated troponin  - 0.06, likely secondary to demand ischemia in the setting of acute on chronic systolic heart failure with concomitant anemia.  - Agree with ECHO.  CK D stage III  - 1.26 creatinine, continue to monitor specially with diuresis.  Iron deficiency anemia  - Interestingly, hemoglobin is actually higher than it was a few days ago. Shortness of breath, multifactorial however hemoglobin improved.  COPD  - Per primary team, nebulizers  - No significant pulmonary edema on chest x-ray.  CAD status post CABG.  Will follow with you.  Candee Furbish, MD  03/22/2016  5:10 PM

## 2016-03-22 NOTE — ED Provider Notes (Signed)
CSN: [id number]     Arrival date & time 03/22/16  1148 History   First MD Initiated Contact with Patient 03/22/16 1312     Chief Complaint  Patient presents with  . low HgB      (Consider location/radiation/quality/duration/timing/severity/associated sxs/prior Treatment) HPI Comments: Patient here complaining of weakness and shortness of breath that has been getting worse over several days. Had a hemoglobin done 7 days ago which was 8.8. Patient has a history of atrial fibrillation and does take Coumadin. He also has a history of gastric ulcer disease. Denies any black or bloody stools. Denies any hematemesis. No severe abdominal pain. Denies any syncope or near syncope. Called his doctor and was told to come in for further evaluation and management.  The history is provided by the patient.    Past Medical History  Diagnosis Date  . Unspecified essential hypertension   . Anemia   . Asthma   . Hepatomegaly   . Esophageal reflux   . Other and unspecified coagulation defects   . Hiatal hernia   . Personal history of colonic polyps 05/29/2010    TUBULAR ADENOMA  . CAD (coronary artery disease)   . Hypothyroidism   . Gout   . Hyperlipidemia   . Gastric antral vascular ectasia 2013  . Atrial fibrillation ([place])   . History of blood transfusion ~ 2012    "blood count dropped; had to get 3 units"  . Arthritis     "hips; back" (07/09/2014)  . COPD (chronic obstructive pulmonary disease) (HCC)     on home oxygen, 2 liters at night10/2015  . Gallstones   . 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   . Cholecystitis 11/2013   Past Surgical History  Procedure Laterality Date  . Coronary angioplasty with stent placement  ~ 2008; 07/08/2014    "1 + 3"  . 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 Hernia repair    . Umbilical hernia repair  1970's  . Left and right heart catheterization with coronary/graft angiogram N/A 07/09/2014    Right and left heart cath, bare metal  stent to SVG to RCA. Sinclair Grooms, MD;   . Colonoscopy  2013    Dr. Sharlett Iles: no polyps or evidence of active bleeding  . Esophagogastroduodenoscopy  2013    Dr. 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 Givens capsule study  2013    Dr. Sharlett Iles: AVM at 13 and blood at 30 minutes beyond first duodenal image but not actual lesion seen. If persistent IDA, bleeding, recommend enteroscopy with ablation   . Cholecystomy tube  12/28/2014 - 01/06/2015    tube clogged with debris, removed and IR unable to place new tube.   . Esophagogastroduodenoscopy N/A 12/12/2015    Procedure: ESOPHAGOGASTRODUODENOSCOPY (EGD);  Surgeon: Gatha Mayer, MD;  Location: [facility];  Service: Endoscopy;  Laterality: N/A;   Family History  Problem Relation Age of Onset  . Leukemia Mother   . Kidney disease Mother     kidney removed   . Colon cancer Neg Hx   . Heart attack Brother   . Hypertension      family   . Cancer Father   . Heart disease Father   . Stomach cancer Sister   . Stomach cancer Sister   . Early death Brother    Social History  Substance Use Topics  . Smoking status: Former  Smoker -- 1.00 packs/day for 42 years    Types: Cigarettes    Quit date: 12/16/1996  . Smokeless tobacco: Never Used  . Alcohol Use: 0.6 oz/week    1 Cans of beer per week     Comment: 07/09/2014 "averages < 1 beer/wk", 11/26/14: denies ETOH    Review of Systems  All other systems reviewed and are negative.     Allergies  Review of patient's allergies indicates no known allergies.  Home Medications   Prior to Admission medications   Medication Sig Start Date End Date Taking? Authorizing Provider  acetaminophen (TYLENOL) 500 MG tablet Take 1,000 mg by mouth every 6 (six) hours as needed for mild pain. For pain.   Yes Historical Provider, MD  amLODipine (NORVASC) 10 MG tablet Take 1 tablet (10 mg total) by mouth daily. 10/28/15  Yes Belva Crome, MD  aspirin  EC 81 MG tablet Take 1 tablet (81 mg total) by mouth every Monday, Wednesday, and Friday. 10/08/14  Yes Belva Crome, MD  BIDIL 20-37.5 MG tablet Take 1 tablet by mouth 3  times daily 11/24/15  Yes Claretta Fraise, MD  budesonide (PULMICORT) 0.25 MG/2ML nebulizer solution Take 0.25 mg by nebulization 2 (two) times daily.    Yes Historical Provider, MD  Cholecalciferol (VITAMIN D3) 5000 UNITS CAPS Take 5,000 Units by mouth daily.    Yes Historical Provider, MD  docusate sodium (COLACE) 100 MG capsule Take 100 mg by mouth 2 (two) times daily as needed for mild constipation.   Yes Historical Provider, MD  ferrous sulfate 325 (65 FE) MG tablet Take 325 mg by mouth 2 (two) times daily.   Yes Historical Provider, MD  furosemide (LASIX) 40 MG tablet Take 1 tablet by mouth  daily Patient taking differently: Take 40 mg by mouth daily.  01/12/16  Yes Claretta Fraise, MD  HYDROcodone-acetaminophen (NORCO) 5-325 MG tablet Take 1 tablet by mouth every 6 (six) hours as needed for moderate pain. 11/02/15  Yes Claretta Fraise, MD  ipratropium-albuterol (DUONEB) 0.5-2.5 (3) MG/3ML SOLN Take 3 mLs by nebulization every 6 (six) hours as needed. For shortness of breath.    Yes Historical Provider, MD  levothyroxine (SYNTHROID, LEVOTHROID) 112 MCG tablet Take 1 tablet by mouth  daily before breakfast Patient taking differently: Take 112 mcg by mouth  daily before breakfast 01/12/16  Yes Claretta Fraise, MD  lovastatin (MEVACOR) 40 MG tablet Take 1 tablet by mouth at  bedtime Patient taking differently: Take 40 mg by mouth at  bedtime 01/22/16  Yes Claretta Fraise, MD  Multiple Vitamins-Minerals (MULTIVITAMINS THER. W/MINERALS) TABS Take 1 tablet by mouth daily.     Yes Historical Provider, MD  nitroGLYCERIN (NITROSTAT) 0.4 MG SL tablet Place 0.4 mg under the tongue every 5 (five) minutes as needed for chest pain.   Yes Historical Provider, MD  pantoprazole (PROTONIX) 40 MG tablet TAKE 1 TABLET BY MOUTH  DAILY Patient taking  differently: TAKE 40 mg BY MOUTH  DAILY 03/02/16  Yes Claretta Fraise, MD  polyethylene glycol powder (GLYCOLAX/MIRALAX) powder Take 1 capful (17 grams) dissolved in at least 8 ounces water/juice once daily. 12/02/15  Yes Jerene Bears, MD  potassium chloride (K-DUR,KLOR-CON) 10 MEQ tablet Take 1 tablet by mouth  daily Patient taking differently: Take 10 meq by mouth  daily 03/02/16  Yes Claretta Fraise, MD  [facility] HFA [phone] Base) MCG/ACT inhaler Use 2 puffs every 6 hours  as needed for shortness of  breath 12/04/15  Yes  Claretta Fraise, MD  sucralfate (CARAFATE) 1 g tablet Take 1 tablet (1 g total) by mouth 4 (four) times daily -  with meals and at bedtime. 01/12/16  Yes Claretta Fraise, MD  warfarin (COUMADIN) 5 MG tablet Take 1 tablet by mouth  every day or as directed by anticoagulation clinic Patient taking differently: Take 5 mg by mouth daily ecxcept tuesday & thursday take 2.5 mg 01/12/16  Yes Claretta Fraise, MD   BP 167/62 mmHg  Pulse 67  Temp(Src) 97.7 F (36.5 C) (Oral)  Resp 18  SpO2 96% Physical Exam  Constitutional: He is oriented to person, place, and time. He appears well-developed and well-nourished.  Non-toxic appearance. No distress.  HENT:  Head: Normocephalic and atraumatic.  Eyes: Conjunctivae, EOM and lids are normal. Pupils are equal, round, and reactive to light.  Neck: Normal range of motion. Neck supple. No tracheal deviation present. No thyroid mass present.  Cardiovascular: Normal rate, regular rhythm and normal heart sounds.  Exam reveals no gallop.   No murmur heard. Pulmonary/Chest: Effort normal and breath sounds normal. No stridor. No respiratory distress. He has no decreased breath sounds. He has no wheezes. He has no rhonchi. He has no rales.  Abdominal: Soft. Normal appearance and bowel sounds are normal. He exhibits no distension. There is no tenderness. There is no rebound and no CVA tenderness.  Musculoskeletal: Normal range of motion. He exhibits no edema or  tenderness.  Neurological: He is alert and oriented to person, place, and time. He has normal strength. No cranial nerve deficit or sensory deficit. GCS eye subscore is 4. GCS verbal subscore is 5. GCS motor subscore is 6.  Skin: Skin is warm and dry. No abrasion and no rash noted.  Psychiatric: He has a normal mood and affect. His speech is normal and behavior is normal.  Nursing note and vitals reviewed.   ED Course  Procedures (including critical care time) Labs Review Labs Reviewed  CBC WITH DIFFERENTIAL/PLATELET  COMPREHENSIVE METABOLIC PANEL  PROTIME-INR  POC OCCULT BLOOD, ED  TYPE AND SCREEN    Imaging Review No results found. I have personally reviewed and evaluated these images and lab results as part of my medical decision-making.   EKG Interpretation None      MDM   Final diagnoses:  None    Consults GI for gas intestinal hemorrhage was made. Patient to be admitted to the hospitalist service    Lacretia Leigh, MD 03/22/16 [phone]

## 2016-03-22 NOTE — ED Notes (Signed)
Bed: [id number] Expected date:  Expected time:  Means of arrival:  Comments: Hold for triage 8

## 2016-03-22 NOTE — ED Notes (Signed)
Pt stat es he is short of breath and wears 02 at night 2.5. Placed pt on 2L.

## 2016-03-22 NOTE — Consult Note (Signed)
Consultation  Referring Provider:  Dr. Zenia Resides    Primary Care Physician:  Claretta Fraise, MD Primary Gastroenterologist: Dr. Carlean Purl      Reason for Consultation: Symptomatic anemia and h/o GAVE syndrome             HPI:   [name] [name] is a 75 y.o. male with h/o CK D stage III, A. fib on Coumadin, COPD on 2 L oxygen at bedtime, GAVE, chronic cholecysitis and constipation who presented to the ER on 03/22/16 for a "low hemoglobin" as directed by his PCP and also a complaint of dyspnea.    Today, at the time of interview patient is found laying in his bed with his wife and daughter by his bedside. They do assist with his history. They explain that the patient has been experiencing a "drop in his hemoglobin over the past 2 weeks". His primary care doctor has been monitoring this and a week ago the patient was instructed to double his iron supplement. Apparently an iron infusion was discussed at some point. Since the patient was seen a week ago he continued with "not feeling well" and getting very short of breath even just "sitting still". Patient reports that his appetite has been "great" and he has been experiencing normal stools. The last time he ate was around 8:30 this morning and it was some "melon". Patient does express that he has some right upper quadrant/epigastric pain intermittently but believes this is due to his "bad gallbladder". He is not currently experiencing this. This pain is typically brought on by certain foods, in fact the patient completely avoids red meat as this causes symptoms.  Patient does report using a mixture of aspirin and Coumadin for his history of A. fib. Patient denies any acute change in his bowel habits and denies melena or hematochezia. Patient also denies fever, chills, anorexia, dysphagia, heartburn, reflux, nausea, vomiting,chest palpitations or syncope.  ED Course: Labs showed hgb 9.8 (increase from 8.8 on 03/15/16), Creatinine 1.41 and BUN 25. INR 2 on  Coumadin. FOBT +  Recent GI eval:  02/04/16-OV-Dr. Pyrtle: Hospital followup for below, was on Pantoprazole 40mg  qd and Carafte 1g 4 times a day-was doing well 12/12/15-EGD-Dr. Carlean Purl: Moderate gastric antral vascular ectasia without bleeding associated with gastritis, ablated with APC, exam otherwise normal 01/05/12-Capsule endoscopy: AVM at 29 min, blood in lumen at 30 min-no discrete lesion and otherwise negative; Recs: enteroscopy with ablation 11/29/11-Colonoscopy- Dr. 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 Unspecified essential hypertension   . Anemia   . Asthma   . Hepatomegaly   . Esophageal reflux   . Other and unspecified coagulation defects   . Hiatal hernia   . Personal history of colonic polyps 05/29/2010    TUBULAR ADENOMA  . CAD (coronary artery disease)   . Hypothyroidism   . Gout   . Hyperlipidemia   . Gastric antral vascular ectasia 2013  . Atrial fibrillation ([place])   . History of blood transfusion ~ 2012    "blood count dropped; had to get 3 units"  . Arthritis     "hips; back" (07/09/2014)  . COPD (chronic obstructive pulmonary disease) (HCC)     on home oxygen, 2 liters at night10/2015  . Gallstones   . 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   . Cholecystitis 11/2013    Past Surgical History  Procedure Laterality Date  . Coronary angioplasty with stent placement  ~ 2008; 07/08/2014    "1 + 3"  . 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 Hernia repair    . Umbilical hernia repair  1970's  . Left and right heart catheterization with coronary/graft angiogram N/A 07/09/2014    Right and left heart cath, bare metal stent to SVG to RCA. Sinclair Grooms, MD;   . Colonoscopy  2013    Dr. Sharlett Iles: no polyps or evidence of active bleeding  . Esophagogastroduodenoscopy  2013    Dr. 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 Givens capsule study  2013    Dr. Sharlett Iles: AVM at 88 and blood at 30 minutes beyond first duodenal image but not actual lesion seen. If persistent IDA, bleeding, recommend enteroscopy with ablation   . Cholecystomy tube  12/28/2014 - 01/06/2015    tube clogged with debris, removed and IR unable to place new tube.   . Esophagogastroduodenoscopy N/A 12/12/2015    Procedure: ESOPHAGOGASTRODUODENOSCOPY (EGD);  Surgeon: Gatha Mayer, MD;  Location: [facility];  Service: Endoscopy;  Laterality: N/A;    Family History  Problem Relation Age of Onset  . Leukemia Mother   . Kidney disease Mother     kidney removed   . Colon cancer Neg Hx   . Heart attack Brother   . Hypertension      family   . Cancer Father   . Heart disease Father   . Stomach cancer Sister   . Stomach cancer Sister   . Early death Brother     Social History  Substance Use Topics  . Smoking status: Former Smoker -- 1.00 packs/day for 42 years    Types: Cigarettes    Quit date: 12/16/1996  . Smokeless tobacco: Never Used  . Alcohol Use: 0.6 oz/week    1 Cans of beer per week     Comment: 07/09/2014 "averages < 1 beer/wk", 11/26/14: denies ETOH    Prior to Admission medications   Medication Sig Start Date End Date Taking? Authorizing Provider  acetaminophen (TYLENOL) 500 MG tablet Take 1,000 mg by mouth every 6 (six) hours as needed for mild pain. For pain.   Yes Historical Provider, MD  amLODipine (NORVASC) 10 MG tablet Take 1 tablet (10 mg total) by mouth daily. 10/28/15  Yes Belva Crome, MD  aspirin EC 81 MG tablet Take 1 tablet (81 mg total) by mouth every Monday, Wednesday, and Friday. 10/08/14  Yes Belva Crome, MD  BIDIL 20-37.5 MG tablet Take 1 tablet by mouth 3  times daily 11/24/15  Yes Claretta Fraise, MD  budesonide (PULMICORT) 0.25 MG/2ML nebulizer solution Take 0.25 mg by nebulization 2 (two) times daily.    Yes Historical Provider, MD   Cholecalciferol (VITAMIN D3) 5000 UNITS CAPS Take 5,000 Units by mouth daily.    Yes Historical Provider, MD  docusate sodium (COLACE) 100 MG capsule Take 100 mg by mouth 2 (two) times daily as needed for mild constipation.   Yes Historical Provider, MD  ferrous sulfate 325 (65 FE) MG tablet Take 325 mg by mouth 2 (two) times daily.   Yes Historical Provider, MD  furosemide (LASIX) 40 MG tablet Take 1 tablet by mouth  daily Patient taking differently: Take 40 mg by mouth daily.  01/12/16  Yes Claretta Fraise, MD  HYDROcodone-acetaminophen (NORCO) 5-325 MG tablet Take 1 tablet by mouth every  6 (six) hours as needed for moderate pain. 11/02/15  Yes Claretta Fraise, MD  ipratropium-albuterol (DUONEB) 0.5-2.5 (3) MG/3ML SOLN Take 3 mLs by nebulization every 6 (six) hours as needed. For shortness of breath.    Yes Historical Provider, MD  levothyroxine (SYNTHROID, LEVOTHROID) 112 MCG tablet Take 1 tablet by mouth  daily before breakfast Patient taking differently: Take 112 mcg by mouth  daily before breakfast 01/12/16  Yes Claretta Fraise, MD  lovastatin (MEVACOR) 40 MG tablet Take 1 tablet by mouth at  bedtime Patient taking differently: Take 40 mg by mouth at  bedtime 01/22/16  Yes Claretta Fraise, MD  Multiple Vitamins-Minerals (MULTIVITAMINS THER. W/MINERALS) TABS Take 1 tablet by mouth daily.     Yes Historical Provider, MD  nitroGLYCERIN (NITROSTAT) 0.4 MG SL tablet Place 0.4 mg under the tongue every 5 (five) minutes as needed for chest pain.   Yes Historical Provider, MD  pantoprazole (PROTONIX) 40 MG tablet TAKE 1 TABLET BY MOUTH  DAILY Patient taking differently: TAKE 40 mg BY MOUTH  DAILY 03/02/16  Yes Claretta Fraise, MD  polyethylene glycol powder (GLYCOLAX/MIRALAX) powder Take 1 capful (17 grams) dissolved in at least 8 ounces water/juice once daily. 12/02/15  Yes Jerene Bears, MD  potassium chloride (K-DUR,KLOR-CON) 10 MEQ tablet Take 1 tablet by mouth  daily Patient taking differently: Take 10 meq by  mouth  daily 03/02/16  Yes Claretta Fraise, MD  [facility] HFA 108 [phone] Base) MCG/ACT inhaler Use 2 puffs every 6 hours  as needed for shortness of  breath 12/04/15  Yes Claretta Fraise, MD  sucralfate (CARAFATE) 1 g tablet Take 1 tablet (1 g total) by mouth 4 (four) times daily -  with meals and at bedtime. 01/12/16  Yes Claretta Fraise, MD  warfarin (COUMADIN) 5 MG tablet Take 1 tablet by mouth  every day or as directed by anticoagulation clinic Patient taking differently: Take 5 mg by mouth daily ecxcept tuesday & thursday take 2.5 mg 01/12/16  Yes Claretta Fraise, MD    Current Facility-Administered Medications  Medication Dose Route Frequency Provider Last Rate Last Dose  . 0.9 %  sodium chloride infusion   Intravenous Continuous Lacretia Leigh, MD 10 mL/hr at 03/22/16 1328 10 mL/hr at 03/22/16 1328   Current Outpatient Prescriptions  Medication Sig Dispense Refill  . acetaminophen (TYLENOL) 500 MG tablet Take 1,000 mg by mouth every 6 (six) hours as needed for mild pain. For pain.    Marland Kitchen amLODipine (NORVASC) 10 MG tablet Take 1 tablet (10 mg total) by mouth daily. 90 tablet 2  . aspirin EC 81 MG tablet Take 1 tablet (81 mg total) by mouth every Monday, Wednesday, and Friday.    Marland Kitchen BIDIL 20-37.5 MG tablet Take 1 tablet by mouth 3  times daily 270 tablet 0  . budesonide (PULMICORT) 0.25 MG/2ML nebulizer solution Take 0.25 mg by nebulization 2 (two) times daily.     . Cholecalciferol (VITAMIN D3) 5000 UNITS CAPS Take 5,000 Units by mouth daily.     Marland Kitchen docusate sodium (COLACE) 100 MG capsule Take 100 mg by mouth 2 (two) times daily as needed for mild constipation.    . ferrous sulfate 325 (65 FE) MG tablet Take 325 mg by mouth 2 (two) times daily.    . furosemide (LASIX) 40 MG tablet Take 1 tablet by mouth  daily (Patient taking differently: Take 40 mg by mouth daily. ) 90 tablet 0  . HYDROcodone-acetaminophen (NORCO) 5-325 MG tablet Take 1 tablet by mouth  every 6 (six) hours as needed for moderate pain. 30 tablet  0  . ipratropium-albuterol (DUONEB) 0.5-2.5 (3) MG/3ML SOLN Take 3 mLs by nebulization every 6 (six) hours as needed. For shortness of breath.     . levothyroxine (SYNTHROID, LEVOTHROID) 112 MCG tablet Take 1 tablet by mouth  daily before breakfast (Patient taking differently: Take 112 mcg by mouth  daily before breakfast) 90 tablet 0  . lovastatin (MEVACOR) 40 MG tablet Take 1 tablet by mouth at  bedtime (Patient taking differently: Take 40 mg by mouth at  bedtime) 90 tablet 0  . Multiple Vitamins-Minerals (MULTIVITAMINS THER. W/MINERALS) TABS Take 1 tablet by mouth daily.      . nitroGLYCERIN (NITROSTAT) 0.4 MG SL tablet Place 0.4 mg under the tongue every 5 (five) minutes as needed for chest pain.    . pantoprazole (PROTONIX) 40 MG tablet TAKE 1 TABLET BY MOUTH  DAILY (Patient taking differently: TAKE 40 mg BY MOUTH  DAILY) 90 tablet 1  . polyethylene glycol powder (GLYCOLAX/MIRALAX) powder Take 1 capful (17 grams) dissolved in at least 8 ounces water/juice once daily. 527 g 4  . potassium chloride (K-DUR,KLOR-CON) 10 MEQ tablet Take 1 tablet by mouth  daily (Patient taking differently: Take 10 meq by mouth  daily) 90 tablet 1  . PROAIR HFA 108 (90 Base) MCG/ACT inhaler Use 2 puffs every 6 hours  as needed for shortness of  breath 34 g 3  . sucralfate (CARAFATE) 1 g tablet Take 1 tablet (1 g total) by mouth 4 (four) times daily -  with meals and at bedtime. 360 tablet 5  . warfarin (COUMADIN) 5 MG tablet Take 1 tablet by mouth  every day or as directed by anticoagulation clinic (Patient taking differently: Take 5 mg by mouth daily ecxcept tuesday & thursday take 2.5 mg) 90 tablet 1    Allergies as of 03/22/2016  . (No Known Allergies)     Review of Systems:    Constitutional: Positive for weakness over the past 2 weeks No weight loss, fever, chills or fatigue HEENT: Eyes: No visual loss, blurred vision, double vision or yellow sclerae               Ears, Nose, Throat:  No hearing loss,  sneezing. Congestion, runny nose or sore throat Skin: No rash or itching Cardiovascular: Patient does report bilateral pedal edema for which she takes a "fluid pill" No chest pain, chest pressure or chest discomfort. No palpitations        Respiratory: Positive for shortness of breath and history of asthma No cough or sputum Gastrointestinal: See HPI and otherwise negative Genitourinary: No dysuria or change in urinary frequency Neurological: No headache, dizziness, syncope, numbness or tingling in the extremities Musculoskeletal: No muscle, back pain, joint pain or stiffness. Hematologic: No anemia, bleeding or bruising Lymphatics: No enlarged lymph nodes or history of splenectomy Psychiatric: No history of depression or anxiety Endocrinologic: No reports of sweating, cold or heat intolerance, poyluria or polydypsia Allergies: No history of asthma, hives or eczema    Physical Exam:  Vital signs in last 24 hours: Temp:  [97.7 F (36.5 C)] 97.7 F (36.5 C) (06/19 1156) Pulse Rate:  [60-67] 60 (06/19 1435) Resp:  [16-18] 16 (06/19 1435) BP: (148-167)/(62-64) 148/64 mmHg (06/19 1435) SpO2:  [95 %-99 %] 99 % (06/19 1435)   General:   Pleasant Caucasian male appears to be in NAD, Well developed, Well nourished, alert and cooperative Head:  Normocephalic and atraumatic. Eyes:  PEERL, EOMI. No icterus. Conjunctiva pink. Ears:  Normal auditory acuity. Neck:  Supple Throat: Oral cavity and pharynx without inflammation, swelling or lesion.  Lungs: Nasal cannula present to O2 at 2 L Respirations even and unlabored. Lungs clear to auscultation bilaterally.   Wheezes present left posterior inferior lung base Heart: Normal S1, S2. No MRG. Regular rate and rhythm. No cyanosis or pallor.  Abdomen:  Soft, nondistended, nontender. No rebound or guarding. Normal bowel sounds. No appreciable masses or hepatomegaly. Rectal:  Not performed. (Previous FOB test and ER positive) Msk:  Symmetrical without  gross deformities. Peripheral pulses intact. Bilateral pedal edema 1+ Extremities:  No deformity or joint abnormality. Normal ROM, normal sensation. Neurologic:  Alert and  oriented x4;  grossly normal neurologically. CN II-XII intact.  Skin:   Dry and intact without significant lesions or rashes. Psychiatric: Oriented to person, place and time. Demonstrates good judgement and reason without abnormal affect or behaviors.   LAB RESULTS:  Recent Labs  03/22/16 1333  WBC 10.6*  HGB 9.8*  HCT 30.2*  PLT 342   BMET  Recent Labs  03/22/16 1333  NA 141  K 4.2  CL 105  CO2 26  GLUCOSE 104*  BUN 25*  CREATININE 1.41*  CALCIUM 9.2   LFT  Recent Labs  03/22/16 1333  PROT 7.8  ALBUMIN 4.8  AST 20  ALT 15*  ALKPHOS 59  BILITOT 1.3*   PT/INR  Recent Labs  03/22/16 1333  LABPROT 21.9*  INR 2.00*    STUDIES: Dg Chest 2 View  03/22/2016  CLINICAL DATA:  Shortness of breath.  COPD. EXAM: CHEST  2 VIEW COMPARISON:  12/12/2015 FINDINGS: Mild enlargement of the cardiopericardial silhouette with indistinct pulmonary vasculature. No Kerley B-lines. Prior CABG. Atherosclerotic aortic arch. Small bilateral pleural effusions with passive atelectasis. Coronary stents noted. IMPRESSION: 1. Mild enlargement of the cardiopericardial silhouette with evidence of pulmonary venous hypertension but no overt edema. 2. Small bilateral pleural effusions. 3. Prior CABG and coronary stents. 4. Atherosclerotic ascending aorta and aortic arch. Electronically Signed   By: Van Clines M.D.   On: 03/22/2016 14:20     PREVIOUS ENDOSCOPIES:            See HPI   Impression / Plan:  Impression: 1. Iron deficiency Anemia: hgb currently 9.8, Which is an increase from 8.8 on 03/15/2016 after the patient was told to double his iron supplement-patient does present with shortness of breath and weakness as well as a positive occult blood study in the ER-consider repeat bleed from history of GAVE versus  chronic anemia versus other- though picture somewhat confusing with hgb better than it has been in the past few mos and SOB increased 2. 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 Carlean Purl 3. A.fib: on chronic anticoagulation with Coumadin-current INR 2 4. Chronic Cholecystitis: Recurrent intermittent RUQ pain-not a surgical candidate per eval by surgery-currently not experiencing discomfort 5. SOB: Need to consider other etiologies including cardiac and respiratory-hospitalist evaluating  Plan: 1. Continue supportive measures 2. Continue to monitor hgb q8h with transfusion as necessary, will order iron studies-patient may benefit from iron infusion 3. Ordered BID PPI 40mg  4. Hold Coumadin for now 5. Patient may have clears today, could consider enteroscopy +/- colonoscopy in the future if hgb trends downward, will observe for now, will also give INR time to normalize 6. Discussed above with Dr. Hilarie Fredrickson, please await any further recommendations  Thank you for your kind consultation, we will  continue to follow.  Lavone Nian Tallen Schnorr  03/22/2016, 2:36 PM Pager #: [phone]

## 2016-03-22 NOTE — H&P (Addendum)
History and Physical    [name] [name] [id number] DOB: [birth year] DOA: 03/22/2016  PCP: Claretta Fraise, MD  Patient coming from: Home   Chief Complaint: SOB, weakness.   HPI: [name] [name] is a 75 y.o. male with medical history significant of GAVE diseases, CKD stage III, A fib on coumadin, COPD on 2 L oxygen who presents complaining of dyspnea. He report that he only uses oxygen at bedtime. He is feeling better since he has been on oxygen. He report dyspnea for last 2 to 3 days. Dyspnea at rest and on exertion. He also report weakness. He denies chest pain, no abdominal pain, no weight gain, report weight loss. No melena or blood in the stool./    ED Course: Hb at 9.8, Chest x ray Mild enlargement of the cardiopericardial silhouette with evidence of pulmonary venous hypertension but no overt edema. Cr 1.4, bili 1.3, INR at 2. Occult blood positive,.  GI was consulted by ED physician.   Review of Systems: As per HPI otherwise 10 point review of systems negative.    Past Medical History  Diagnosis Date  . Unspecified essential hypertension   . Anemia   . Asthma   . Hepatomegaly   . Esophageal reflux   . Other and unspecified coagulation defects   . Hiatal hernia   . Personal history of colonic polyps 05/29/2010    TUBULAR ADENOMA  . CAD (coronary artery disease)   . Hypothyroidism   . Gout   . Hyperlipidemia   . Gastric antral vascular ectasia 2013  . Atrial fibrillation ([place])   . History of blood transfusion ~ 2012    "blood count dropped; had to get 3 units"  . Arthritis     "hips; back" (07/09/2014)  . COPD (chronic obstructive pulmonary disease) (HCC)     on home oxygen, 2 liters at night10/2015  . Gallstones   . 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   . Cholecystitis 11/2013    Past Surgical History  Procedure Laterality Date  . Coronary angioplasty with stent placement  ~ 2008; 07/08/2014    "1 + 3"  . Coronary artery bypass graft  1998    CABG X4    . Hernia repair    . Umbilical hernia repair  1970's  . Left and right heart catheterization with coronary/graft angiogram N/A 07/09/2014    Right and left heart cath, bare metal stent to SVG to RCA. Sinclair Grooms, MD;   . Colonoscopy  2013    Dr. Sharlett Iles: no polyps or evidence of active bleeding  . Esophagogastroduodenoscopy  2013    Dr. 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 Givens capsule study  2013    Dr. Sharlett Iles: AVM at 38 and blood at 30 minutes beyond first duodenal image but not actual lesion seen. If persistent IDA, bleeding, recommend enteroscopy with ablation   . Cholecystomy tube  12/28/2014 - 01/06/2015    tube clogged with debris, removed and IR unable to place new tube.   . Esophagogastroduodenoscopy N/A 12/12/2015    Procedure: ESOPHAGOGASTRODUODENOSCOPY (EGD);  Surgeon: Gatha Mayer, MD;  Location: [facility];  Service: Endoscopy;  Laterality: N/A;   Social History:   reports that he quit smoking about 19 years ago. His smoking use included Cigarettes. He has a 42 pack-year smoking history. He has never used smokeless tobacco. He reports that he drinks about 0.6 oz of alcohol per week. He  reports that he does not use illicit drugs.  No Known Allergies  Family History  Problem Relation Age of Onset  . Leukemia Mother   . Kidney disease Mother     kidney removed   . Colon cancer Neg Hx   . Heart attack Brother   . Hypertension      family   . Cancer Father   . Heart disease Father   . Stomach cancer Sister   . Stomach cancer Sister   . Early death Brother      Prior to Admission medications   Medication Sig Start Date End Date Taking? Authorizing Provider  acetaminophen (TYLENOL) 500 MG tablet Take 1,000 mg by mouth every 6 (six) hours as needed for mild pain. For pain.   Yes Historical Provider, MD  amLODipine (NORVASC) 10 MG tablet Take 1 tablet (10 mg total) by mouth daily. 10/28/15  Yes Belva Crome,  MD  aspirin EC 81 MG tablet Take 1 tablet (81 mg total) by mouth every Monday, Wednesday, and Friday. 10/08/14  Yes Belva Crome, MD  BIDIL 20-37.5 MG tablet Take 1 tablet by mouth 3  times daily 11/24/15  Yes Claretta Fraise, MD  budesonide (PULMICORT) 0.25 MG/2ML nebulizer solution Take 0.25 mg by nebulization 2 (two) times daily.    Yes Historical Provider, MD  Cholecalciferol (VITAMIN D3) 5000 UNITS CAPS Take 5,000 Units by mouth daily.    Yes Historical Provider, MD  docusate sodium (COLACE) 100 MG capsule Take 100 mg by mouth 2 (two) times daily as needed for mild constipation.   Yes Historical Provider, MD  ferrous sulfate 325 (65 FE) MG tablet Take 325 mg by mouth 2 (two) times daily.   Yes Historical Provider, MD  furosemide (LASIX) 40 MG tablet Take 1 tablet by mouth  daily Patient taking differently: Take 40 mg by mouth daily.  01/12/16  Yes Claretta Fraise, MD  HYDROcodone-acetaminophen (NORCO) 5-325 MG tablet Take 1 tablet by mouth every 6 (six) hours as needed for moderate pain. 11/02/15  Yes Claretta Fraise, MD  ipratropium-albuterol (DUONEB) 0.5-2.5 (3) MG/3ML SOLN Take 3 mLs by nebulization every 6 (six) hours as needed. For shortness of breath.    Yes Historical Provider, MD  levothyroxine (SYNTHROID, LEVOTHROID) 112 MCG tablet Take 1 tablet by mouth  daily before breakfast Patient taking differently: Take 112 mcg by mouth  daily before breakfast 01/12/16  Yes Claretta Fraise, MD  lovastatin (MEVACOR) 40 MG tablet Take 1 tablet by mouth at  bedtime Patient taking differently: Take 40 mg by mouth at  bedtime 01/22/16  Yes Claretta Fraise, MD  Multiple Vitamins-Minerals (MULTIVITAMINS THER. W/MINERALS) TABS Take 1 tablet by mouth daily.     Yes Historical Provider, MD  nitroGLYCERIN (NITROSTAT) 0.4 MG SL tablet Place 0.4 mg under the tongue every 5 (five) minutes as needed for chest pain.   Yes Historical Provider, MD  pantoprazole (PROTONIX) 40 MG tablet TAKE 1 TABLET BY MOUTH  DAILY Patient  taking differently: TAKE 40 mg BY MOUTH  DAILY 03/02/16  Yes Claretta Fraise, MD  polyethylene glycol powder (GLYCOLAX/MIRALAX) powder Take 1 capful (17 grams) dissolved in at least 8 ounces water/juice once daily. 12/02/15  Yes Jerene Bears, MD  potassium chloride (K-DUR,KLOR-CON) 10 MEQ tablet Take 1 tablet by mouth  daily Patient taking differently: Take 10 meq by mouth  daily 03/02/16  Yes Claretta Fraise, MD  [facility] HFA [id number] [phone] Base) MCG/ACT inhaler Use 2 puffs every 6 hours  as  needed for shortness of  breath 12/04/15  Yes Claretta Fraise, MD  sucralfate (CARAFATE) 1 g tablet Take 1 tablet (1 g total) by mouth 4 (four) times daily -  with meals and at bedtime. 01/12/16  Yes Claretta Fraise, MD  warfarin (COUMADIN) 5 MG tablet Take 1 tablet by mouth  every day or as directed by anticoagulation clinic Patient taking differently: Take 5 mg by mouth daily ecxcept tuesday & thursday take 2.5 mg 01/12/16  Yes Claretta Fraise, MD    Physical Exam: Filed Vitals:   03/22/16 1156 03/22/16 1316 03/22/16 1435  BP: 167/62  148/64  Pulse: 67  60  Temp: 97.7 F (36.5 C)    TempSrc: Oral    Resp: 18  16  SpO2: 95% 96% 99%      Constitutional: NAD, calm, comfortable Filed Vitals:   03/22/16 1156 03/22/16 1316 03/22/16 1435  BP: 167/62  148/64  Pulse: 67  60  Temp: 97.7 F (36.5 C)    TempSrc: Oral    Resp: 18  16  SpO2: 95% 96% 99%   Eyes: PERRL, lids and conjunctivae normal ENMT: Mucous membranes are moist. Posterior pharynx clear of any exudate or lesions.Normal dentition.  Neck: normal, supple, no masses, no thyromegaly Respiratory: clear to auscultation bilaterally, no wheezing, no crackles. Normal respiratory effort. No accessory muscle use.  Cardiovascular: Regular rate and rhythm, no murmurs / rubs / gallops. No extremity edema. 2+ pedal pulses. No carotid bruits.  Abdomen: no tenderness, no masses palpated. No hepatosplenomegaly. Bowel sounds positive.  Musculoskeletal: no clubbing / cyanosis.  No joint deformity upper and lower extremities. Good ROM, no contractures. Normal muscle tone.  Skin: no rashes, lesions, ulcers. No induration Neurologic: CN 2-12 grossly intact. Sensation intact, DTR normal. Strength 5/5 in all 4.  Psychiatric: Normal judgment and insight. Alert and oriented x 3. Normal mood.     Labs on Admission: I have personally reviewed following labs and imaging studies  CBC:  Recent Labs Lab 03/22/16 1333  WBC 10.6*  NEUTROABS 8.4*  HGB 9.8*  HCT 30.2*  MCV 95.3  PLT [id number]   Basic Metabolic Panel:  Recent Labs Lab 03/22/16 1333  NA 141  K 4.2  CL 105  CO2 26  GLUCOSE 104*  BUN 25*  CREATININE 1.41*  CALCIUM 9.2   GFR: CrCl cannot be calculated (Unknown ideal weight.). Liver Function Tests:  Recent Labs Lab 03/22/16 1333  AST 20  ALT 15*  ALKPHOS 59  BILITOT 1.3*  PROT 7.8  ALBUMIN 4.8   No results for input(s): LIPASE, AMYLASE in the last 168 hours. No results for input(s): AMMONIA in the last 168 hours. Coagulation Profile:  Recent Labs Lab 03/22/16 1333  INR 2.00*   Cardiac Enzymes: No results for input(s): CKTOTAL, CKMB, CKMBINDEX, TROPONINI in the last 168 hours. BNP (last 3 results) No results for input(s): PROBNP in the last 8760 hours. HbA1C: No results for input(s): HGBA1C in the last 72 hours. CBG: No results for input(s): GLUCAP in the last 168 hours. Lipid Profile: No results for input(s): CHOL, HDL, LDLCALC, TRIG, CHOLHDL, LDLDIRECT in the last 72 hours. Thyroid Function Tests: No results for input(s): TSH, T4TOTAL, FREET4, T3FREE, THYROIDAB in the last 72 hours. Anemia Panel: No results for input(s): VITAMINB12, FOLATE, FERRITIN, TIBC, IRON, RETICCTPCT in the last 72 hours. Urine analysis:    Component Value Date/Time   COLORURINE AMBER* 01/06/2015 1543   APPEARANCEUR CLEAR 01/06/2015 1543   LABSPEC 1.023 01/06/2015 1543  PHURINE 5.0 01/06/2015 1543   GLUCOSEU NEGATIVE 01/06/2015 1543   HGBUR  NEGATIVE 01/06/2015 1543   BILIRUBINUR SMALL* 01/06/2015 1543   BILIRUBINUR NEG 07/16/2013 1059   KETONESUR NEGATIVE 01/06/2015 1543   PROTEINUR 30* 01/06/2015 1543   PROTEINUR NEG 07/16/2013 1059   UROBILINOGEN 0.2 01/06/2015 1543   UROBILINOGEN negative 07/16/2013 1059   NITRITE NEGATIVE 01/06/2015 1543   NITRITE NEG 07/16/2013 1059   LEUKOCYTESUR SMALL* 01/06/2015 1543   Sepsis Labs: !!!!!!!!!!!!!!!!!!!!!!!!!!!!!!!!!!!!!!!!!!!! @LABRCNTIP (procalcitonin:4,lacticidven:4) )No results found for this or any previous visit (from the past 240 hour(s)).   Radiological Exams on Admission: Dg Chest 2 View  03/22/2016  CLINICAL DATA:  Shortness of breath.  COPD. EXAM: CHEST  2 VIEW COMPARISON:  12/12/2015 FINDINGS: Mild enlargement of the cardiopericardial silhouette with indistinct pulmonary vasculature. No Kerley B-lines. Prior CABG. Atherosclerotic aortic arch. Small bilateral pleural effusions with passive atelectasis. Coronary stents noted. IMPRESSION: 1. Mild enlargement of the cardiopericardial silhouette with evidence of pulmonary venous hypertension but no overt edema. 2. Small bilateral pleural effusions. 3. Prior CABG and coronary stents. 4. Atherosclerotic ascending aorta and aortic arch. Electronically Signed   By: Van Clines M.D.   On: 03/22/2016 14:20    EKG; none ordered.   Assessment/Plan Active Problems:   Iron deficiency anemia   Essential hypertension   Chronic systolic CHF (congestive heart failure) (HCC)   Anticoagulant long-term use   Atrial fibrillation, chronic (HCC)   CKD (chronic kidney disease), stage III   GAVE (gastric antral vascular ectasia)   1-Dyspnea:  This could be multifactorial. Secondary to anemia. COPD, HF Will admit to telemetry, cycle cardiac enzymes, EKG.  Chest x ray with no pulmonary edema but with pulmonary venous HTN.  Will also check ECHO, BNP.  Troponin mild elevated, cardiology consulted.   2-Anemia: iron deficiency. Possible  GI bleed.  Hb one month ago at 9. Hb was at 8 few days ago.  Concern with chronic GI bleed.  Monitor Hb.  GI consulted.    3-COPD; Continue with nebulizer.   4-A fib: not on rate control medications. On coumadin, which will be hold for possible GI evaluation.   5-CKD stage III;  Last cr per records 1.26 Monitor on oral lasix.   6-Chronic Systolic HF, last Ef at 30 %;  Strict I and O.  Will give one time dose of IV lasix.  Follow BNP.  On oral lasix at home  Repeat ECHO.  Cardiology consulted.   DVT prophylaxis: SCD risk for bleeding.  Code Status: full code.  Family Communication: care discussed with wife and daughter  Disposition Plan: home at time of discharge Consults called: [facility]  Admission status: observation, telemetry    Niel Hummer A MD [facility] Pager [phone]  If 7PM-7AM, please contact night-coverage [email or web address] Password [facility]  03/22/2016, 3:04 PM

## 2016-03-22 NOTE — ED Notes (Signed)
Per pt, states PCP sent him here for low HgB

## 2016-03-22 NOTE — Telephone Encounter (Signed)
Spoke with daughter and she stated that they have been unsuccessful in reaching anybody for the referral that was given . States that her dad is getting weaker and telephone nurse that calls and checks on patient recommended that he go the hospital. Agree with nurse and advised to go to ER and to call with any problems

## 2016-03-23 ENCOUNTER — Observation Stay (HOSPITAL_BASED_OUTPATIENT_CLINIC_OR_DEPARTMENT_OTHER): Payer: Medicare Other

## 2016-03-23 DIAGNOSIS — R0600 Dyspnea, unspecified: Secondary | ICD-10-CM

## 2016-03-23 DIAGNOSIS — Z7901 Long term (current) use of anticoagulants: Secondary | ICD-10-CM | POA: Diagnosis not present

## 2016-03-23 DIAGNOSIS — K31819 Angiodysplasia of stomach and duodenum without bleeding: Secondary | ICD-10-CM | POA: Diagnosis not present

## 2016-03-23 DIAGNOSIS — R195 Other fecal abnormalities: Secondary | ICD-10-CM | POA: Diagnosis not present

## 2016-03-23 DIAGNOSIS — I5023 Acute on chronic systolic (congestive) heart failure: Secondary | ICD-10-CM | POA: Diagnosis not present

## 2016-03-23 DIAGNOSIS — I482 Chronic atrial fibrillation: Secondary | ICD-10-CM | POA: Diagnosis not present

## 2016-03-23 DIAGNOSIS — I10 Essential (primary) hypertension: Secondary | ICD-10-CM | POA: Diagnosis not present

## 2016-03-23 DIAGNOSIS — D509 Iron deficiency anemia, unspecified: Secondary | ICD-10-CM | POA: Diagnosis not present

## 2016-03-23 LAB — BASIC METABOLIC PANEL
Anion gap: 8 (ref 5–15)
BUN: 23 mg/dL — AB (ref 6–20)
CO2: 27 mmol/L (ref 22–32)
CREATININE: 1.48 mg/dL — AB (ref 0.61–1.24)
Calcium: 8.6 mg/dL — ABNORMAL LOW (ref 8.9–10.3)
Chloride: 105 mmol/L (ref 101–111)
GFR calc Af Amer: 51 mL/min — ABNORMAL LOW (ref >60)
GFR, EST NON AFRICAN AMERICAN: 44 mL/min — AB (ref >60)
Glucose, Bld: 98 mg/dL (ref 65–99)
Potassium: 4.2 mmol/L (ref 3.5–5.1)
Sodium: 140 mmol/L (ref 135–145)

## 2016-03-23 LAB — ECHOCARDIOGRAM COMPLETE
FS: 17 % — AB (ref 28–44)
Height: 70 in
IV/PV OW: 0.9
LA diam end sys: 54 mm
LA vol index: 80.5 mL/m2
LADIAMINDEX: 2.63 cm/m2
LASIZE: 54 mm
LAVOL: 165 mL
LAVOLA4C: 174 mL
LV PW d: 14.8 mm — AB (ref 0.6–1.1)
LVOT area: 3.46 cm2
LVOTD: 21 mm
Weight: 3065.28 oz

## 2016-03-23 LAB — CBC
HCT: 25.8 % — ABNORMAL LOW (ref 39.0–52.0)
Hemoglobin: 8.3 g/dL — ABNORMAL LOW (ref 13.0–17.0)
MCH: 30.7 pg (ref 26.0–34.0)
MCHC: 32.2 g/dL (ref 30.0–36.0)
MCV: 95.6 fL (ref 78.0–100.0)
PLATELETS: 273 10*3/uL (ref 150–400)
RBC: 2.7 MIL/uL — ABNORMAL LOW (ref 4.22–5.81)
RDW: 15.7 % — AB (ref 11.5–15.5)
WBC: 9.3 10*3/uL (ref 4.0–10.5)

## 2016-03-23 LAB — HEMOGLOBIN: Hemoglobin: 8.4 g/dL — ABNORMAL LOW (ref 13.0–17.0)

## 2016-03-23 LAB — PROTIME-INR
INR: 2.1 — ABNORMAL HIGH (ref 0.00–1.49)
PROTHROMBIN TIME: 23.4 s — AB (ref 11.6–15.2)

## 2016-03-23 LAB — TROPONIN I: Troponin I: 0.06 ng/mL — ABNORMAL HIGH (ref <0.031)

## 2016-03-23 MED ORDER — PERFLUTREN LIPID MICROSPHERE
1.0000 mL | INTRAVENOUS | Status: AC | PRN
  Administered 2016-03-23: 2 mL via INTRAVENOUS
  Filled 2016-03-23: qty 10

## 2016-03-23 MED ORDER — PERFLUTREN LIPID MICROSPHERE
INTRAVENOUS | Status: AC
  Filled 2016-03-23: qty 10

## 2016-03-23 NOTE — Progress Notes (Signed)
  Echocardiogram 2D Echocardiogram with Definity has been performed.  [name] [name] 03/23/2016, 3:13 PM

## 2016-03-23 NOTE — Progress Notes (Signed)
Cardiologist: Dr. Tamala Julian Subjective:  Breathing mildly improved, no chest pain.  Admitted with 34 days of worsening shortness of breath, has home oxygen dependent COPD, chronic anemia, chronic systolic heart failure EF 30%.  Objective:  Vital Signs in the last 24 hours: Temp:  [97.8 F (36.6 C)-98.7 F (37.1 C)] 98.7 F (37.1 C) (06/20 0500) Pulse Rate:  [60-70] 65 (06/20 0500) Resp:  [16-20] 20 (06/20 0500) BP: (143-148)/(47-64) 145/59 mmHg (06/20 0500) SpO2:  [95 %-99 %] 95 % (06/20 0759) Weight:  [190 lb 7.6 oz (86.4 kg)-191 lb 9.3 oz (86.9 kg)] 191 lb 9.3 oz (86.9 kg) (06/20 0500)  Intake/Output from previous day: 06/19 0701 - 06/20 0700 In: 540 [P.O.:540] Out: 1175 [Urine:1175]   Physical Exam: General: Well developed, well nourished, in no acute distress. Head:  Normocephalic and atraumatic. Lungs: Clear to auscultation and percussion. Heart: Normal S1 and S2.  No murmur, rubs or gallops.  Abdomen: soft, non-tender, positive bowel sounds. Extremities: No clubbing or cyanosis. No edema. Neurologic: Alert and oriented x 3.    Lab Results:  Recent Labs  03/22/16 1333 03/23/16 0347 03/23/16 1044  WBC 10.6* 9.3  --   HGB 9.8* 8.3* 8.4*  PLT 342 273  --     Recent Labs  03/22/16 1333 03/23/16 0347  NA 141 140  K 4.2 4.2  CL 105 105  CO2 26 27  GLUCOSE 104* 98  BUN 25* 23*  CREATININE 1.41* 1.48*    Recent Labs  03/22/16 2144 03/23/16 0347  TROPONINI 0.06* 0.06*   Hepatic Function Panel  Recent Labs  03/22/16 1333  PROT 7.8  ALBUMIN 4.8  AST 20  ALT 15*  ALKPHOS 59  BILITOT 1.3*   No results for input(s): CHOL in the last 72 hours. No results for input(s): PROTIME in the last 72 hours.  Imaging: Dg Chest 2 View  03/22/2016  CLINICAL DATA:  Shortness of breath.  COPD. EXAM: CHEST  2 VIEW COMPARISON:  12/12/2015 FINDINGS: Mild enlargement of the cardiopericardial silhouette with indistinct pulmonary vasculature. No Kerley B-lines.  Prior CABG. Atherosclerotic aortic arch. Small bilateral pleural effusions with passive atelectasis. Coronary stents noted. IMPRESSION: 1. Mild enlargement of the cardiopericardial silhouette with evidence of pulmonary venous hypertension but no overt edema. 2. Small bilateral pleural effusions. 3. Prior CABG and coronary stents. 4. Atherosclerotic ascending aorta and aortic arch. Electronically Signed   By: Van Clines M.D.   On: 03/22/2016 14:20   Personally viewed.   Telemetry: No adverse arrhythmias Personally viewed.   EKG:  No ST segment changes, atrial fibrillation, right bundle branch block Personally viewed.  Cardiac Studies:  EF 30% on last echocardiogram. Pending new  Meds: Scheduled Meds: . budesonide  0.25 mg Nebulization BID  . ferrous sulfate  325 mg Oral BID  . furosemide  40 mg Intravenous BID  . ipratropium-albuterol  3 mL Nebulization TID  . isosorbide-hydrALAZINE  1 tablet Oral TID  . levothyroxine  112 mcg Oral QAC breakfast  . multivitamin with minerals  1 tablet Oral Daily  . pantoprazole  40 mg Oral Daily  . pravastatin  40 mg Oral q1800  . sodium chloride flush  3 mL Intravenous Q12H  . sucralfate  1 g Oral TID WC & HS   Continuous Infusions: . sodium chloride 10 mL/hr (03/22/16 1328)   PRN Meds:.sodium chloride, acetaminophen **OR** acetaminophen, albuterol, HYDROcodone-acetaminophen, nitroGLYCERIN, ondansetron **OR** ondansetron (ZOFRAN) IV, sodium chloride flush  Assessment/Plan:    Atrial fibrillation - Holding  Coumadin in case bleeding, procedure, INR is still therapeutic - Would advocate for Coumadin only (no aspirin) once able to restart to reduce bleeding risk.   Acute on Chronic systolic heart failure - IV Lasix has been administered. It would not be unreasonable to continue with scheduled IV Lasix twice a day. Continue.  - Watch Creat. 1.48 from 1.41  Elevated troponin - 0.06 flat, likely secondary to demand ischemia in the  setting of acute on chronic systolic heart failure with concomitant anemia. Not true acute coronary syndrome. No further ischemic workup needed. - Agree with ECHO. Awaiting.  CK D stage III - 1.26 now 1.48 creatinine, continue to monitor specially with diuresis.  Iron deficiency anemia - Interestingly, hemoglobin is actually higher than it was a few days ago. Shortness of breath, multifactorial however hemoglobin improved. Hemoglobin however now is 8.3 from 9.8. All of his conditions are playing a role.  COPD - Per primary team, nebulizers - No significant pulmonary edema on chest x-ray.  CAD status post CABG.  Will follow with you.    Candee Furbish 03/23/2016, 12:47 PM

## 2016-03-23 NOTE — Progress Notes (Signed)
[facility]  PROGRESS NOTE  [name] [name] [id number] DOB: [birth year] DOA: 03/22/2016 PCP: Claretta Fraise, MD    Brief HPI:  75 y.o. male with medical history significant of GAVE diseases, CKD stage III, A fib on coumadin, COPD on 2 L oxygen who presents complaining of dyspnea. He report that he only uses oxygen at bedtime. He is feeling better since he has been on oxygen. He report dyspnea for last 2 to 3 days. Dyspnea at rest and on exertion. He also report weakness. He denies chest pain, no abdominal pain, no weight gain, report weight loss. No melena or blood in the stool. Active Problems:   Iron deficiency anemia   Essential hypertension   Chronic systolic CHF (congestive heart failure) (HCC)   Anticoagulant long-term use   Atrial fibrillation, chronic (HCC)   CKD (chronic kidney disease), stage III   GAVE (gastric antral vascular ectasia)   GI bleed   Dyspnea   Heme positive stool   Acute on chronic systolic heart failure (HCC)   Assessment/Plan: 1. CHF exacerbation, acute on chronic systolic heart failure- echocardiogram shows global hypokinesis, EF 25-30%. Continue Lasix 40 mg every 12 hours. Cardiology following. 2. Anemia-patient has history of chronic anemia due to GAVE status post APC ablation in March. There is no overt bleeding or melena. GI has seen the patient and recommend to continue PPI and replace iron  if deficient. Continue to hold Coumadin. Iron level 156. No replacement needed at this time. 3. Atrial fibrillation- heart rate is controlled, Coumadin on hold. 4. Mild elevation of troponin- troponins 0.06, likely from demand ischemia. No intervention as per cardiology 5. COPD- stable, no significant pulmonary edema on cxr    DVT prophylaxis: SCD Code Status: Full code Family Communication: No family at bedside  Disposition Plan: Pending improvement in CHF  exacerbation   Consultants:  Cardiology  Procedures:  Echocardiogram  Antibiotics:  None   Subjective: Patient seen and examined, has diuresed well with Lasix. Breathing better this morning.  Objective: Filed Vitals:   03/23/16 0500 03/23/16 0759 03/23/16 1300 03/23/16 1410  BP: 145/59  147/45   Pulse: 65  66   Temp: 98.7 F (37.1 C)  97.8 F (36.6 C)   TempSrc: Oral  Oral   Resp: 20  18   Height:      Weight: 86.9 kg (191 lb 9.3 oz)     SpO2: 98% 95%  96%    Intake/Output Summary (Last 24 hours) at 03/23/16 1651 Last data filed at 03/23/16 1300  Gross per 24 hour  Intake    780 ml  Output   1575 ml  Net   -795 ml   Filed Weights   03/22/16 2135 03/23/16 0500  Weight: 86.4 kg (190 lb 7.6 oz) 86.9 kg (191 lb 9.3 oz)    Examination:  General exam: Appears calm and comfortable  Respiratory system: Decreased breath sounds bilaterally Cardiovascular system: S1 & S2 heard, RRR. No JVD, murmurs, rubs, gallops or clicks. No pedal edema. Gastrointestinal system: Abdomen is nondistended, soft and nontender. No organomegaly or masses felt. Normal bowel sounds heard. Central nervous system: Alert and oriented. No focal neurological deficits. Extremities: Symmetric 5 x 5 power. Skin: No rashes, lesions or ulcers Psychiatry: Judgement and insight appear normal. Mood & affect appropriate.    Data Reviewed: I have personally reviewed following labs and imaging studies Basic Metabolic Panel:  Recent Labs Lab 03/22/16 1333 03/23/16 0347  NA 141 140  K 4.2 4.2  CL  105 105  CO2 26 27  GLUCOSE 104* 98  BUN 25* 23*  CREATININE 1.41* 1.48*  CALCIUM 9.2 8.6*   Liver Function Tests:  Recent Labs Lab 03/22/16 1333  AST 20  ALT 15*  ALKPHOS 59  BILITOT 1.3*  PROT 7.8  ALBUMIN 4.8   No results for input(s): LIPASE, AMYLASE in the last 168 hours. No results for input(s): AMMONIA in the last 168 hours. CBC:  Recent Labs Lab 03/22/16 1333 03/23/16 0347  03/23/16 1044  WBC 10.6* 9.3  --   NEUTROABS 8.4*  --   --   HGB 9.8* 8.3* 8.4*  HCT 30.2* 25.8*  --   MCV 95.3 95.6  --   PLT 342 273  --    Cardiac Enzymes:  Recent Labs Lab 03/22/16 1333 03/22/16 2144 03/23/16 0347  TROPONINI 0.06* 0.06* 0.06*   BNP (last 3 results)  Recent Labs  12/07/15 1056 12/15/15 1418 03/22/16 1333  BNP 566.9* 741.2* 520.5*     Studies: Dg Chest 2 View  03/22/2016  CLINICAL DATA:  Shortness of breath.  COPD. EXAM: CHEST  2 VIEW COMPARISON:  12/12/2015 FINDINGS: Mild enlargement of the cardiopericardial silhouette with indistinct pulmonary vasculature. No Kerley B-lines. Prior CABG. Atherosclerotic aortic arch. Small bilateral pleural effusions with passive atelectasis. Coronary stents noted. IMPRESSION: 1. Mild enlargement of the cardiopericardial silhouette with evidence of pulmonary venous hypertension but no overt edema. 2. Small bilateral pleural effusions. 3. Prior CABG and coronary stents. 4. Atherosclerotic ascending aorta and aortic arch. Electronically Signed   By: Van Clines M.D.   On: 03/22/2016 14:20    Scheduled Meds: . budesonide  0.25 mg Nebulization BID  . ferrous sulfate  325 mg Oral BID  . furosemide  40 mg Intravenous BID  . ipratropium-albuterol  3 mL Nebulization TID  . isosorbide-hydrALAZINE  1 tablet Oral TID  . levothyroxine  112 mcg Oral QAC breakfast  . multivitamin with minerals  1 tablet Oral Daily  . pantoprazole  40 mg Oral Daily  . pravastatin  40 mg Oral q1800  . sodium chloride flush  3 mL Intravenous Q12H  . sucralfate  1 g Oral TID WC & HS   Continuous Infusions: . sodium chloride 10 mL/hr (03/22/16 1328)       Time spent: 20 min    [facility] Pager [phone]. If 7PM-7AM, please contact night-coverage at [email or web address], Office  [phone]  password [email or web address] 03/23/2016, 4:51 PM  LOS: 1 day

## 2016-03-23 NOTE — Progress Notes (Signed)
Progress Note   Subjective  [name] [name] is a 75 year old Caucasian male who was admitted to the hospital on 03/22/16 with a complaint of dyspnea and a "low hemoglobin".  This morning the patient is found laying in bed. He tells me that he has done well overnight, other than developing some back pain which she thinks is from the bed. He denies any signs of GI bleeding or abdominal discomfort. The patient is aware that he is undergoing cardiac workup. He denies any new complaints or concerns, but tells me that he is hungry.   Objective   Vital signs in last 24 hours: Temp:  [97.7 F (36.5 C)-98.7 F (37.1 C)] 98.7 F (37.1 C) (06/20 0500) Pulse Rate:  [60-70] 65 (06/20 0500) Resp:  [16-20] 20 (06/20 0500) BP: (143-167)/(47-64) 145/59 mmHg (06/20 0500) SpO2:  [95 %-99 %] 95 % (06/20 0759) Weight:  [190 lb 7.6 oz (86.4 kg)-191 lb 9.3 oz (86.9 kg)] 191 lb 9.3 oz (86.9 kg) (06/20 0500) Last BM Date: 03/22/16 General: Caucasian male in NAD Heart:  Regular rate and rhythm; no murmurs Lungs: Respirations even and unlabored, lungs CTA bilaterally Abdomen:  Soft, nontender and nondistended. Normal bowel sounds. Extremities:  Without edema. Neurologic:  Alert and oriented,  grossly normal neurologically. Psych:  Cooperative. Normal mood and affect.  Intake/Output from previous day: 06/19 0701 - 06/20 0700 In: 540 [P.O.:540] Out: 1175 [Urine:1175] Intake/Output this shift: Total I/O In: -  Out: 200 [Urine:200]  Lab Results:  Recent Labs  03/22/16 1333 03/23/16 0347  WBC 10.6* 9.3  HGB 9.8* 8.3*  HCT 30.2* 25.8*  PLT 342 273   BMET  Recent Labs  03/22/16 1333 03/23/16 0347  NA 141 140  K 4.2 4.2  CL 105 105  CO2 26 27  GLUCOSE 104* 98  BUN 25* 23*  CREATININE 1.41* 1.48*  CALCIUM 9.2 8.6*   LFT  Recent Labs  03/22/16 1333  PROT 7.8  ALBUMIN 4.8  AST 20  ALT 15*  ALKPHOS 59  BILITOT 1.3*   PT/INR  Recent Labs  03/22/16 1333  LABPROT 21.9*  INR 2.00*     Studies/Results: Dg Chest 2 View  03/22/2016  CLINICAL DATA:  Shortness of breath.  COPD. EXAM: CHEST  2 VIEW COMPARISON:  12/12/2015 FINDINGS: Mild enlargement of the cardiopericardial silhouette with indistinct pulmonary vasculature. No Kerley B-lines. Prior CABG. Atherosclerotic aortic arch. Small bilateral pleural effusions with passive atelectasis. Coronary stents noted. IMPRESSION: 1. Mild enlargement of the cardiopericardial silhouette with evidence of pulmonary venous hypertension but no overt edema. 2. Small bilateral pleural effusions. 3. Prior CABG and coronary stents. 4. Atherosclerotic ascending aorta and aortic arch. Electronically Signed   By: Van Clines M.D.   On: 03/22/2016 14:20    Assessment / Plan:   Impression: 1. Iron deficiency Anemia: hgb was 9.8 on 03/22/16, which was an increase from 8.8 on 03/15/2016 after the patient was told to double his iron supplement-patient presented with shortness of breath and weakness as well as a positive occult blood study in the ER-today 6/20 hgb 8.3, will recheck-no signs of acute GI bleed, iron studies normal 2. 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 Carlean Purl 3. A.fib: on chronic anticoagulation with Coumadin, held starting 03/22/16-INR not checked since yesterday and was 2.0 4. Chronic Cholecystitis: Recurrent intermittent RUQ pain-not a surgical candidate per eval by surgery-currently not experiencing discomfort 5. SOB: Need to consider other etiologies including cardiopulmonary-echo ordered and pending  Plan:  1. Continue supportive measures 2. Ordered repeat hgb and INR 3. Continue BID PPI 40mg  and Carafate TID AC 4. Continue to hold Coumadin for now 5. Will advance to regular diet. 6.  Again may eventually need repeat endoscopy vs pill capsule 7. Will discuss above with Dr. Hilarie Fredrickson, please await any further recommendations  Thank you for your kind consultation, we will continue to follow.  Active  Problems:   Iron deficiency anemia   Essential hypertension   Chronic systolic CHF (congestive heart failure) (HCC)   Anticoagulant long-term use   Atrial fibrillation, chronic (HCC)   CKD (chronic kidney disease), stage III   GAVE (gastric antral vascular ectasia)   GI bleed   Dyspnea   Heme positive stool   Acute on chronic systolic heart failure ([place])     LOS: 1 day   Levin Erp  03/23/2016, 10:06 AM  Pager # [phone]

## 2016-03-24 DIAGNOSIS — K31819 Angiodysplasia of stomach and duodenum without bleeding: Secondary | ICD-10-CM | POA: Diagnosis not present

## 2016-03-24 DIAGNOSIS — I5022 Chronic systolic (congestive) heart failure: Secondary | ICD-10-CM | POA: Diagnosis not present

## 2016-03-24 DIAGNOSIS — I5023 Acute on chronic systolic (congestive) heart failure: Secondary | ICD-10-CM | POA: Diagnosis not present

## 2016-03-24 DIAGNOSIS — R0600 Dyspnea, unspecified: Secondary | ICD-10-CM | POA: Diagnosis not present

## 2016-03-24 DIAGNOSIS — N183 Chronic kidney disease, stage 3 (moderate): Secondary | ICD-10-CM

## 2016-03-24 DIAGNOSIS — K922 Gastrointestinal hemorrhage, unspecified: Secondary | ICD-10-CM

## 2016-03-24 DIAGNOSIS — R195 Other fecal abnormalities: Secondary | ICD-10-CM | POA: Diagnosis not present

## 2016-03-24 DIAGNOSIS — Z7901 Long term (current) use of anticoagulants: Secondary | ICD-10-CM | POA: Diagnosis not present

## 2016-03-24 DIAGNOSIS — I482 Chronic atrial fibrillation: Secondary | ICD-10-CM | POA: Diagnosis not present

## 2016-03-24 DIAGNOSIS — D509 Iron deficiency anemia, unspecified: Secondary | ICD-10-CM | POA: Diagnosis not present

## 2016-03-24 DIAGNOSIS — I10 Essential (primary) hypertension: Secondary | ICD-10-CM

## 2016-03-24 LAB — CBC
HCT: 26.4 % — ABNORMAL LOW (ref 39.0–52.0)
Hemoglobin: 8.4 g/dL — ABNORMAL LOW (ref 13.0–17.0)
MCH: 31 pg (ref 26.0–34.0)
MCHC: 31.8 g/dL (ref 30.0–36.0)
MCV: 97.4 fL (ref 78.0–100.0)
PLATELETS: 304 10*3/uL (ref 150–400)
RBC: 2.71 MIL/uL — AB (ref 4.22–5.81)
RDW: 15.7 % — ABNORMAL HIGH (ref 11.5–15.5)
WBC: 10.5 10*3/uL (ref 4.0–10.5)

## 2016-03-24 LAB — BASIC METABOLIC PANEL
Anion gap: 7 (ref 5–15)
BUN: 23 mg/dL — AB (ref 6–20)
CO2: 31 mmol/L (ref 22–32)
CREATININE: 1.49 mg/dL — AB (ref 0.61–1.24)
Calcium: 8.6 mg/dL — ABNORMAL LOW (ref 8.9–10.3)
Chloride: 104 mmol/L (ref 101–111)
GFR, EST AFRICAN AMERICAN: 51 mL/min — AB (ref >60)
GFR, EST NON AFRICAN AMERICAN: 44 mL/min — AB (ref >60)
Glucose, Bld: 91 mg/dL (ref 65–99)
POTASSIUM: 4.1 mmol/L (ref 3.5–5.1)
SODIUM: 142 mmol/L (ref 135–145)

## 2016-03-24 LAB — PROTIME-INR
INR: 1.87 — ABNORMAL HIGH (ref 0.00–1.49)
PROTHROMBIN TIME: 21.5 s — AB (ref 11.6–15.2)

## 2016-03-24 MED ORDER — WARFARIN SODIUM 6 MG PO TABS
6.0000 mg | ORAL_TABLET | Freq: Once | ORAL | Status: AC
  Administered 2016-03-24: 6 mg via ORAL
  Filled 2016-03-24 (×2): qty 1

## 2016-03-24 MED ORDER — FUROSEMIDE 10 MG/ML IJ SOLN
40.0000 mg | Freq: Three times a day (TID) | INTRAMUSCULAR | Status: DC
  Administered 2016-03-24 – 2016-03-25 (×3): 40 mg via INTRAVENOUS
  Filled 2016-03-24 (×3): qty 4

## 2016-03-24 MED ORDER — WARFARIN - PHARMACIST DOSING INPATIENT: Freq: Every day | Status: DC

## 2016-03-24 MED ORDER — DOCUSATE SODIUM 100 MG PO CAPS
100.0000 mg | ORAL_CAPSULE | Freq: Two times a day (BID) | ORAL | Status: DC
  Administered 2016-03-24 – 2016-03-25 (×3): 100 mg via ORAL
  Filled 2016-03-24 (×3): qty 1

## 2016-03-24 NOTE — Progress Notes (Signed)
Progress Note   Subjective  [name] [name] is a 75 y/o Caucasian male who was admitted tot eh hospital on 03/22/16 with a complaint of dyspnea and "low hgb". He has been dx with an acute exacerbation of his CHF on top of his chronic anemia likely resulting from hx of GAVE.  This morning, the patient is found laying in bed and he immediately tells me that he is "ready to go home".  He explains that his breathing is better and he denies any other symptoms this morning. He is aware of plans for VCE outpatient.   Objective   Vital signs in last 24 hours: Temp:  [97.8 F (36.6 C)-99.1 F (37.3 C)] 99.1 F (37.3 C) (06/21 0541) Pulse Rate:  [64-67] 67 (06/21 0541) Resp:  [18] 18 (06/21 0541) BP: (131-160)/(44-45) 160/45 mmHg (06/21 0541) SpO2:  [94 %-97 %] 97 % (06/21 0846) Weight:  [186 lb 11.7 oz (84.7 kg)] 186 lb 11.7 oz (84.7 kg) (06/21 0541) Last BM Date: 03/22/16 General: Caucasian male in NAD Heart: Regular rate and rhythm; no murmurs Lungs: Respirations even and unlabored, lungs CTA bilaterally, with [place] in place Abdomen: Soft, nontender and nondistended. Normal bowel sounds. Extremities: Without edema. Neurologic: Alert and oriented, grossly normal neurologically. Psych: Cooperative. Normal mood and affect.  Intake/Output from previous day: 06/20 0701 - 06/21 0700 In: 720 [P.O.:720] Out: 1025 [Urine:1025]  Lab Results:  Recent Labs  03/22/16 1333 03/23/16 0347 03/23/16 1044 03/24/16 0439  WBC 10.6* 9.3  --  10.5  HGB 9.8* 8.3* 8.4* 8.4*  HCT 30.2* 25.8*  --  26.4*  PLT 342 273  --  304   BMET  Recent Labs  03/22/16 1333 03/23/16 0347 03/24/16 0439  NA 141 140 142  K 4.2 4.2 4.1  CL 105 105 104  CO2 26 27 31   GLUCOSE 104* 98 91  BUN 25* 23* 23*  CREATININE 1.41* 1.48* 1.49*  CALCIUM 9.2 8.6* 8.6*   LFT  Recent Labs  03/22/16 1333  PROT 7.8  ALBUMIN 4.8  AST 20  ALT 15*  ALKPHOS 59  BILITOT 1.3*   PT/INR  Recent Labs  03/22/16 1333  03/23/16 1044  LABPROT 21.9* 23.4*  INR 2.00* 2.10*    Studies/Results: Dg Chest 2 View  03/22/2016  CLINICAL DATA:  Shortness of breath.  COPD. EXAM: CHEST  2 VIEW COMPARISON:  12/12/2015 FINDINGS: Mild enlargement of the cardiopericardial silhouette with indistinct pulmonary vasculature. No Kerley B-lines. Prior CABG. Atherosclerotic aortic arch. Small bilateral pleural effusions with passive atelectasis. Coronary stents noted. IMPRESSION: 1. Mild enlargement of the cardiopericardial silhouette with evidence of pulmonary venous hypertension but no overt edema. 2. Small bilateral pleural effusions. 3. Prior CABG and coronary stents. 4. Atherosclerotic ascending aorta and aortic arch. Electronically Signed   By: Van Clines M.D.   On: 03/22/2016 14:20       Assessment / Plan:    Impression: 1. Iron deficiency Anemia: hgb was 9.8 on 03/22/16, which was an increase from 8.8 on 03/15/2016 after the patient was told to double his iron supplement-patient presented with shortness of breath and weakness as well as a positive occult blood study in the ER-yesterday hgb remained stable around 8.4-plans for VCE outpatient for further eval 2. 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 Carlean Purl 3. A.fib: on chronic anticoagulation with Coumadin, held starting 03/22/16-INR therapeutic-will recommend restarting coumadin today 4. Chronic Cholecystitis: Recurrent intermittent RUQ pain-not a surgical candidate per eval by surgery-currently  not experiencing discomfort 5. SOB: Thought to be related to CHF exacerbation-cardiology following  Plan: 1. Continue supportive measures 2. Continue BID PPI 40mg  and Carafate TID AC 3. Recommend restarting Coumadin today 4. Will arrange for outpatient follow up, including VCE and hgb check 5. Will discuss above with Dr. Hilarie Fredrickson, please await any further recommendations  Thank you for your kind consultation, we will sign off.   Active Problems:    Iron deficiency anemia   Essential hypertension   Chronic systolic CHF (congestive heart failure) (HCC)   Anticoagulant long-term use   Atrial fibrillation, chronic (HCC)   CKD (chronic kidney disease), stage III   GAVE (gastric antral vascular ectasia)   GI bleed   Dyspnea   Heme positive stool   Acute on chronic systolic heart failure ([place])     LOS: 2 days   Levin Erp  03/24/2016, 8:54 AM  Pager # [phone]

## 2016-03-24 NOTE — Progress Notes (Signed)
[facility] PROGRESS NOTE    Progress Note  [name] [name]  [id number] DOB: 1941/01/14 DOA: 03/22/2016 PCP: Claretta Fraise, MD     Brief Narrative:   [name] [name] is an 75 y.o. male past medical history of kidney disease, chronic kidney disease stage III, atrial fibrillation on Coumadin COPD on 2 L of oxygen percent is complaining of dyspnea  Assessment/Plan:   Acute on chronic systolic heart failure: 2-D echo showed global hypokinesia with an EF of 25% he was started on IV Lasix and cardiology was consulted. Recommended to continue IV Lasix no ACE inhibitor due to chronic kidney disease stage III, he was started on BiDil. Not a candidate for beta blocker due to bradycardia.  Elevated troponin: Flat likely due to demand ischemia. Further workup needed  Iron deficiency anemia With history of [name] [name] status post ablation in March no signs of bleeding continue PPI and Carafate Start Coumadin from GI standpoint. Follow-up with GI as an outpatient.  Essential hypertension Continue IV Lasix and BiDil.  Anticoagulant long-term use: Restart Coumadin.  Atrial fibrillation, chronic ([place]): Rate controlled resume Coumadin.  CKD (chronic kidney disease), stage III Creatinine has remained stable at baseline, not a candidate for ACE inhibitor continue BiDil.   DVT prophylaxis: coumadin Family Communication:none Disposition Plan/Barrier to D/C: home in am Code Status:     Code Status Orders        Start     Ordered   03/22/16 1656  Full code   Continuous     03/22/16 1655    Code Status History    Date Active Date Inactive Code Status Order ID Comments User Context   12/07/2015  4:29 PM 12/13/2015  6:30 PM Full Code [id number]  Albertine Patricia, MD Inpatient   01/06/2015  8:56 PM 01/08/2015  4:36 PM Full Code [id number]  Delfina Redwood, MD Inpatient   12/28/2014  2:59 PM 12/31/2014  4:28 PM Full Code [id number]  Corrie Mckusick, DO Inpatient   12/25/2014  7:35 PM  12/28/2014  2:59 PM Full Code [id number]  Jani Gravel, MD Inpatient   11/26/2014  2:13 PM 11/28/2014  7:05 PM Full Code [id number]  Radene Gunning, NP Inpatient   07/09/2014  1:48 PM 07/10/2014  2:56 PM Full Code [id number]  Belva Crome, MD Inpatient        IV Access:    Peripheral IV   Procedures and diagnostic studies:   Dg Chest 2 View  03/22/2016  CLINICAL DATA:  Shortness of breath.  COPD. EXAM: CHEST  2 VIEW COMPARISON:  12/12/2015 FINDINGS: Mild enlargement of the cardiopericardial silhouette with indistinct pulmonary vasculature. No Kerley B-lines. Prior CABG. Atherosclerotic aortic arch. Small bilateral pleural effusions with passive atelectasis. Coronary stents noted. IMPRESSION: 1. Mild enlargement of the cardiopericardial silhouette with evidence of pulmonary venous hypertension but no overt edema. 2. Small bilateral pleural effusions. 3. Prior CABG and coronary stents. 4. Atherosclerotic ascending aorta and aortic arch. Electronically Signed   By: Van Clines M.D.   On: 03/22/2016 14:20     Medical Consultants:    None.  Anti-Infectives:   none  Subjective:    [name] [name] Feel great, Able to a flat to sleep.  Objective:    Filed Vitals:   03/23/16 2057 03/23/16 2225 03/24/16 0541 03/24/16 0846  BP:  131/44 160/45   Pulse:  64 67   Temp:  98.6 F (37 C) 99.1 F (37.3 C)   TempSrc:  Oral Oral   Resp:  18   Height:      Weight:   84.7 kg (186 lb 11.7 oz)   SpO2: 97% 96% 94% 97%    Intake/Output Summary (Last 24 hours) at 03/24/16 1251 Last data filed at 03/24/16 1145  Gross per 24 hour  Intake    720 ml  Output   1675 ml  Net   -955 ml   Filed Weights   03/22/16 2135 03/23/16 0500 03/24/16 0541  Weight: 86.4 kg (190 lb 7.6 oz) 86.9 kg (191 lb 9.3 oz) 84.7 kg (186 lb 11.7 oz)    Exam: General exam: In no acute distress. Respiratory system: Good air movement and With crackles on the right. Cardiovascular system: S1 & S2 heard, RRR. +  JVD Gastrointestinal system: Abdomen is nondistended, soft and nontender.  Central nervous system: Alert and oriented. No focal neurological deficits. Extremities: No pedal edema. Skin: No rashes, lesions or ulcers Psychiatry: Judgement and insight appear normal. Mood & affect appropriate.    Data Reviewed:    Labs: Basic Metabolic Panel:  Recent Labs Lab 03/22/16 1333 03/23/16 0347 03/24/16 0439  NA 141 140 142  K 4.2 4.2 4.1  CL 105 105 104  CO2 26 27 31   GLUCOSE 104* 98 91  BUN 25* 23* 23*  CREATININE 1.41* 1.48* 1.49*  CALCIUM 9.2 8.6* 8.6*   GFR Estimated Creatinine Clearance: 43.5 mL/min (by C-G formula based on Cr of 1.49). Liver Function Tests:  Recent Labs Lab 03/22/16 1333  AST 20  ALT 15*  ALKPHOS 59  BILITOT 1.3*  PROT 7.8  ALBUMIN 4.8   No results for input(s): LIPASE, AMYLASE in the last 168 hours. No results for input(s): AMMONIA in the last 168 hours. Coagulation profile  Recent Labs Lab 03/22/16 1333 03/23/16 1044 03/24/16 0925  INR 2.00* 2.10* 1.87*    CBC:  Recent Labs Lab 03/22/16 1333 03/23/16 0347 03/23/16 1044 03/24/16 0439  WBC 10.6* 9.3  --  10.5  NEUTROABS 8.4*  --   --   --   HGB 9.8* 8.3* 8.4* 8.4*  HCT 30.2* 25.8*  --  26.4*  MCV 95.3 95.6  --  97.4  PLT 342 273  --  304   Cardiac Enzymes:  Recent Labs Lab 03/22/16 1333 03/22/16 2144 03/23/16 0347  TROPONINI 0.06* 0.06* 0.06*   BNP (last 3 results) No results for input(s): PROBNP in the last 8760 hours. CBG: No results for input(s): GLUCAP in the last 168 hours. D-Dimer: No results for input(s): DDIMER in the last 72 hours. Hgb A1c: No results for input(s): HGBA1C in the last 72 hours. Lipid Profile: No results for input(s): CHOL, HDL, LDLCALC, TRIG, CHOLHDL, LDLDIRECT in the last 72 hours. Thyroid function studies: No results for input(s): TSH, T4TOTAL, T3FREE, THYROIDAB in the last 72 hours.  Invalid input(s): FREET3 Anemia work up:  Recent  Labs  03/22/16 1650  TIBC 445  IRON 156   Sepsis Labs:  Recent Labs Lab 03/22/16 1333 03/23/16 0347 03/24/16 0439  WBC 10.6* 9.3 10.5   Microbiology No results found for this or any previous visit (from the past 240 hour(s)).   Medications:   . budesonide  0.25 mg Nebulization BID  . ferrous sulfate  325 mg Oral BID  . furosemide  40 mg Intravenous BID  . ipratropium-albuterol  3 mL Nebulization TID  . isosorbide-hydrALAZINE  1 tablet Oral TID  . levothyroxine  112 mcg Oral QAC breakfast  . multivitamin with minerals  1 tablet Oral  Daily  . pantoprazole  40 mg Oral Daily  . pravastatin  40 mg Oral q1800  . sodium chloride flush  3 mL Intravenous Q12H  . sucralfate  1 g Oral TID WC & HS   Continuous Infusions: . sodium chloride 10 mL/hr (03/22/16 1328)    Time spent: 25 min   LOS: 2 days   Charlynne Cousins  [facility] Pager [phone]  *Please refer to [email or web address], password [email or web address] to get updated schedule on who will round on this patient, as hospitalists switch teams weekly. If 7PM-7AM, please contact night-coverage at [email or web address], password [email or web address] for any overnight needs.  03/24/2016, 12:51 PM

## 2016-03-24 NOTE — Plan of Care (Signed)
Problem: Food- and Nutrition-Related Knowledge Deficit (NB-1.1) Goal: Nutrition education Formal process to instruct or train a patient/client in a skill or to impart knowledge to help patients/clients voluntarily manage or modify food choices and eating behavior to maintain or improve health. Outcome: Completed/Met Date Met:  03/24/16 Nutrition Education Note  RD consulted for nutrition education regarding CHF.  RD provided "Low Sodium Nutrition Therapy" handout from the Academy of [employer]. Reviewed patient's dietary recall. Provided examples on ways to decrease sodium intake in diet. Discouraged intake of processed foods and use of salt shaker. Discussed ways to flavor food when cooking with herbs and spices ([name] [name]). Encouraged fresh fruits and vegetables as well as whole grain sources of carbohydrates to maximize fiber intake.   RD discussed why it is important for patient to adhere to diet recommendations, and emphasized the role of fluids, foods to avoid, and importance of weighing self daily. Teach back method used.  Expect fair-good compliance with support. Pt with many questions during education. RD answered all questions.   Body mass index is 26.79 kg/(m^2). Pt meets criteria for overweight based on current BMI.  Current diet order is Heart Healthy, patient is consuming approximately 75% of meals at this time. Labs and medications reviewed. No further nutrition interventions warranted at this time. If additional nutrition issues arise, please re-consult RD.   [name] Bibles, MS, RD, LDN Pager: [phone] After Hours Pager: [phone]

## 2016-03-24 NOTE — Progress Notes (Addendum)
Patient Profile: 75 -year-old with history of coronary artery disease status post bypass surgery with chronic systolic heart failure, atrial fibrillation, history of GI bleeding, cirrhosis, chronic kidney disease and O2 dependent COPD here with 3-4 day history of shortness of breath despite increased hemoglobin. Cardiology consulted for elevated troponin and CHF.    Subjective: No complaints. Feels well. Receiving breathing treatment. Ready to go home.   Objective: Vital signs in last 24 hours: Temp:  [97.8 F (36.6 C)-99.1 F (37.3 C)] 99.1 F (37.3 C) (06/21 0541) Pulse Rate:  [64-67] 67 (06/21 0541) Resp:  [18] 18 (06/21 0541) BP: (131-160)/(44-45) 160/45 mmHg (06/21 0541) SpO2:  [94 %-97 %] 94 % (06/21 0541) Weight:  [186 lb 11.7 oz (84.7 kg)] 186 lb 11.7 oz (84.7 kg) (06/21 0541) Last BM Date: 03/22/16  Intake/Output from previous day: 06/20 0701 - 06/21 0700 In: 720 [P.O.:720] Out: 1025 [Urine:1025] Intake/Output this shift:    Medications Current Facility-Administered Medications  Medication Dose Route Frequency Provider Last Rate Last Dose  . 0.9 %  sodium chloride infusion   Intravenous Continuous Lacretia Leigh, MD 10 mL/hr at 03/22/16 1328 10 mL/hr at 03/22/16 1328  . 0.9 %  sodium chloride infusion  250 mL Intravenous PRN Belkys A Regalado, MD      . acetaminophen (TYLENOL) tablet 650 mg  650 mg Oral Q6H PRN Belkys A Regalado, MD       Or  . acetaminophen (TYLENOL) suppository 650 mg  650 mg Rectal Q6H PRN Belkys A Regalado, MD      . albuterol (PROVENTIL) (2.5 MG/3ML) 0.083% nebulizer solution 2.5 mg  2.5 mg Nebulization Q2H PRN Belkys A Regalado, MD   2.5 mg at 03/23/16 0424  . budesonide (PULMICORT) nebulizer solution 0.25 mg  0.25 mg Nebulization BID Belkys A Regalado, MD   0.25 mg at 03/23/16 2056  . ferrous sulfate tablet 325 mg  325 mg Oral BID Belkys A Regalado, MD   325 mg at 03/23/16 2228  . furosemide (LASIX) injection 40 mg  40 mg Intravenous BID  Jerline Pain, MD   40 mg at 03/23/16 1753  . HYDROcodone-acetaminophen (NORCO/VICODIN) 5-325 MG per tablet 1 tablet  1 tablet Oral Q6H PRN Belkys A Regalado, MD      . ipratropium-albuterol (DUONEB) 0.5-2.5 (3) MG/3ML nebulizer solution 3 mL  3 mL Nebulization TID Belkys A Regalado, MD   3 mL at 03/23/16 2056  . isosorbide-hydrALAZINE (BIDIL) 20-37.5 MG per tablet 1 tablet  1 tablet Oral TID Elmarie Shiley, MD   1 tablet at 03/23/16 2228  . levothyroxine (SYNTHROID, LEVOTHROID) tablet 112 mcg  112 mcg Oral QAC breakfast Belkys A Regalado, MD   112 mcg at 03/23/16 1004  . multivitamin with minerals tablet 1 tablet  1 tablet Oral Daily Belkys A Regalado, MD   1 tablet at 03/23/16 1003  . nitroGLYCERIN (NITROSTAT) SL tablet 0.4 mg  0.4 mg Sublingual Q5 min PRN Belkys A Regalado, MD      . ondansetron (ZOFRAN) tablet 4 mg  4 mg Oral Q6H PRN Belkys A Regalado, MD       Or  . ondansetron (ZOFRAN) injection 4 mg  4 mg Intravenous Q6H PRN Belkys A Regalado, MD   4 mg at 03/22/16 2224  . pantoprazole (PROTONIX) EC tablet 40 mg  40 mg Oral Daily Lavone Nian [place], [place]   40 mg at 03/23/16 1004  . pravastatin (PRAVACHOL) tablet 40 mg  40 mg Oral q1800  Elmarie Shiley, MD   40 mg at 03/23/16 1753  . sodium chloride flush (NS) 0.9 % injection 3 mL  3 mL Intravenous Q12H Belkys A Regalado, MD   3 mL at 03/23/16 2228  . sodium chloride flush (NS) 0.9 % injection 3 mL  3 mL Intravenous PRN Belkys A Regalado, MD      . sucralfate (CARAFATE) tablet 1 g  1 g Oral TID WC & HS Belkys A Regalado, MD   1 g at 03/23/16 2228    PE: General appearance: alert, cooperative and no distress Neck: no carotid bruit and no JVD Lungs: clear to auscultation bilaterally Heart: regular rate and rhythm, S1, S2 normal, no murmur, click, rub or gallop Extremities: no LEE Pulses: 2+ and symmetric Skin: warm and dry Neurologic: Grossly normal  Lab Results:   Recent Labs  03/22/16 1333 03/23/16 0347 03/23/16 1044  03/24/16 0439  WBC 10.6* 9.3  --  10.5  HGB 9.8* 8.3* 8.4* 8.4*  HCT 30.2* 25.8*  --  26.4*  PLT 342 273  --  304   BMET  Recent Labs  03/22/16 1333 03/23/16 0347 03/24/16 0439  NA 141 140 142  K 4.2 4.2 4.1  CL 105 105 104  CO2 26 27 31   GLUCOSE 104* 98 91  BUN 25* 23* 23*  CREATININE 1.41* 1.48* 1.49*  CALCIUM 9.2 8.6* 8.6*   PT/INR  Recent Labs  03/22/16 1333 03/23/16 1044  LABPROT 21.9* 23.4*  INR 2.00* 2.10*   Studies: 2D Echo  Study Conclusions  - Left ventricle: The cavity size was normal. There was mild to  moderate concentric hypertrophy. Systolic function was severely  reduced. The estimated ejection fraction was in the range of 25%  to 30%. Global hypokinesis of worse in the anterolateral  myocardium. The study is not technically sufficient to allow  evaluation of LV diastolic function. - Aortic valve: Transvalvular velocity was within the normal range.  There was no stenosis. There was no regurgitation. - Mitral valve: Calcified annulus. There was mild regurgitation. - Left atrium: The atrium was severely dilated. - Right ventricle: The cavity size was mildly dilated. Wall  thickness was normal. Systolic function was reduced. - Right atrium: The atrium was severely dilated. - Tricuspid valve: There was trivial regurgitation.  Assessment/Plan  Active Problems:   Iron deficiency anemia   Essential hypertension   Chronic systolic CHF (congestive heart failure) (HCC)   Anticoagulant long-term use   Atrial fibrillation, chronic (HCC)   CKD (chronic kidney disease), stage III   GAVE (gastric antral vascular ectasia)   GI bleed   Dyspnea   Heme positive stool   Acute on chronic systolic heart failure ([place])    1. Acute on Chronic Systolic HF: EF unchanged from prior echo, remaining at 25-30%. Diuresing with IV lasix. - 1L out yesterday. Renal function is holding steady with no significant change in SCr from 1.41>>1.48>>1.49. No ACE/ARB  given stage III CKD. He is on Bidil in place of ACE/ARB for afterload reduction. Currently not on a BB, presumably due to bradycardia. Baseline HR in the low 60s. Continue home lasix dose at time of d/c. Low sodium diet advised.   2. Elevated Troponin:  Flat trend. 0.06 x 3, likely secondary to demand ischemia in the setting of acute on chronic systolic heart failure with concomitant anemia. Not true acute coronary syndrome. EF unchanged from prior echo in March. EF remains 25-30%. No further ischemic workup needed.  3. Atrial Fibrillation:  rate is controlled in the 60s. Seen by GI. Per GI recs, ok to restart Warfarin today. No ASA.   4. CAD: s/p CABG. No anginal symptoms.  Stop ASA given IDA/GIB.   5. COPD: COPD - Per primary team, nebulizers - No significant pulmonary edema on chest x-ray.  6. Iron Deficiency Anemia: no further drop in H/H. Hgb has remained stable at 8.4. She is on PO Fe. Seen by GI. Per GI recs, ok ot restart Warfarin today. No ASA. Per GI, video capsule (VCE) as an outpatient is the best test to eval heme + stools.   7. CKD, stage III: SCr remains stable at 1.49.     LOS: 2 days    Brittainy M. Ladoris Gene 03/24/2016 7:57 AM  Personally seen and examined. Agree with above. OK for home from cardiology standpoint Home lasix dose on DC NO ASPIRIN on DC. Warfarin only.   Candee Furbish, MD

## 2016-03-24 NOTE — Progress Notes (Signed)
ANTICOAGULATION CONSULT NOTE - Initial Consult  Pharmacy Consult for Warfarin Indication: atrial fibrillation  No Known Allergies  Patient Measurements: Height: 5\' 10"  (177.8 cm) Weight: 186 lb 11.7 oz (84.7 kg) IBW/kg (Calculated) : 73  Vital Signs: Temp: 99.1 F (37.3 C) (06/21 0541) Temp Source: Oral (06/21 0541) BP: 160/45 mmHg (06/21 0541) Pulse Rate: 67 (06/21 0541)  Labs:  Recent Labs  03/22/16 1333 03/22/16 2144 03/23/16 0347 03/23/16 1044 03/24/16 0439 03/24/16 0925  HGB 9.8*  --  8.3* 8.4* 8.4*  --   HCT 30.2*  --  25.8*  --  26.4*  --   PLT 342  --  273  --  304  --   LABPROT 21.9*  --   --  23.4*  --  21.5*  INR 2.00*  --   --  2.10*  --  1.87*  CREATININE 1.41*  --  1.48*  --  1.49*  --   TROPONINI 0.06* 0.06* 0.06*  --   --   --     Estimated Creatinine Clearance: 43.5 mL/min (by C-G formula based on Cr of 1.49).   Medical History: Past Medical History  Diagnosis Date  . Unspecified essential hypertension   . Anemia   . Asthma   . Hepatomegaly   . Esophageal reflux   . Other and unspecified coagulation defects   . Hiatal hernia   . Personal history of colonic polyps 05/29/2010    TUBULAR ADENOMA  . CAD (coronary artery disease)   . Hypothyroidism   . Gout   . Hyperlipidemia   . Gastric antral vascular ectasia 2013  . Atrial fibrillation ([place])   . History of blood transfusion ~ 2012    "blood count dropped; had to get 3 units"  . Arthritis     "hips; back" (07/09/2014)  . COPD (chronic obstructive pulmonary disease) (HCC)     on home oxygen, 2 liters at night10/2015  . Gallstones   . 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   . Cholecystitis 11/2013    Medications:  Prescriptions prior to admission  Medication Sig Dispense Refill Last Dose  . acetaminophen (TYLENOL) 500 MG tablet Take 1,000 mg by mouth every 6 (six) hours as needed for mild pain. For pain.   Past Week at Unknown time  . amLODipine (NORVASC) 10 MG tablet Take 1  tablet (10 mg total) by mouth daily. 90 tablet 2 03/22/2016 at Unknown time  . aspirin EC 81 MG tablet Take 1 tablet (81 mg total) by mouth every Monday, Wednesday, and Friday.   03/22/2016 at 0930  . BIDIL 20-37.5 MG tablet Take 1 tablet by mouth 3  times daily 270 tablet 0 03/22/2016 at Unknown time  . budesonide (PULMICORT) 0.25 MG/2ML nebulizer solution Take 0.25 mg by nebulization 2 (two) times daily.    03/22/2016 at Unknown time  . Cholecalciferol (VITAMIN D3) 5000 UNITS CAPS Take 5,000 Units by mouth daily.    03/22/2016 at Unknown time  . docusate sodium (COLACE) 100 MG capsule Take 100 mg by mouth 2 (two) times daily as needed for mild constipation.   03/22/2016 at Unknown time  . ferrous sulfate 325 (65 FE) MG tablet Take 325 mg by mouth 2 (two) times daily.   03/22/2016 at Unknown time  . furosemide (LASIX) 40 MG tablet Take 1 tablet by mouth  daily (Patient taking differently: Take 40 mg by mouth daily. ) 90 tablet 0 03/22/2016 at Unknown time  .  HYDROcodone-acetaminophen (NORCO) 5-325 MG tablet Take 1 tablet by mouth every 6 (six) hours as needed for moderate pain. 30 tablet 0 Past Week at Unknown time  . ipratropium-albuterol (DUONEB) 0.5-2.5 (3) MG/3ML SOLN Take 3 mLs by nebulization every 6 (six) hours as needed. For shortness of breath.    Past Week at Unknown time  . levothyroxine (SYNTHROID, LEVOTHROID) 112 MCG tablet Take 1 tablet by mouth  daily before breakfast (Patient taking differently: Take 112 mcg by mouth  daily before breakfast) 90 tablet 0 03/22/2016 at Unknown time  . lovastatin (MEVACOR) 40 MG tablet Take 1 tablet by mouth at  bedtime (Patient taking differently: Take 40 mg by mouth at  bedtime) 90 tablet 0 03/22/2016 at Unknown time  . Multiple Vitamins-Minerals (MULTIVITAMINS THER. W/MINERALS) TABS Take 1 tablet by mouth daily.     03/22/2016 at Unknown time  . nitroGLYCERIN (NITROSTAT) 0.4 MG SL tablet Place 0.4 mg under the tongue every 5 (five) minutes as needed for chest  pain.   Taking  . pantoprazole (PROTONIX) 40 MG tablet TAKE 1 TABLET BY MOUTH  DAILY (Patient taking differently: TAKE 40 mg BY MOUTH  DAILY) 90 tablet 1 03/22/2016 at Unknown time  . polyethylene glycol powder (GLYCOLAX/MIRALAX) powder Take 1 capful (17 grams) dissolved in at least 8 ounces water/juice once daily. 527 g 4 03/22/2016 at Unknown time  . potassium chloride (K-DUR,KLOR-CON) 10 MEQ tablet Take 1 tablet by mouth  daily (Patient taking differently: Take 10 meq by mouth  daily) 90 tablet 1 03/22/2016 at Unknown time  . PROAIR HFA 108 (90 Base) MCG/ACT inhaler Use 2 puffs every 6 hours  as needed for shortness of  breath 34 g 3 03/22/2016 at Unknown time  . sucralfate (CARAFATE) 1 g tablet Take 1 tablet (1 g total) by mouth 4 (four) times daily -  with meals and at bedtime. 360 tablet 5 03/22/2016 at Unknown time  . warfarin (COUMADIN) 5 MG tablet Take 1 tablet by mouth  every day or as directed by anticoagulation clinic (Patient taking differently: Take 5 mg by mouth daily ecxcept tuesday & thursday take 2.5 mg) 90 tablet 1 03/22/2016 at 0630    Assessment: 40 yoM presented to ED on 6/19 for c/o low Hgb and dyspnea. PMH includes GAVE disease, CKD-III, Afib on chronic warfarin, COPD, iron deficiency anemia, CHF, GI bleed. Admission Hgb was 9.8, and stool is heme positive, but patient denies melena or other bleeding.  GI recommends PPI, outpatient f/u, and resuming warfarin.  Pharmacy is now consulted by [facility] to resume warfarin dosing.  PTA warfarin dose 5 mg by mouth daily except 2.5 mg on Tues/Thurs. Last dose taken on 6/19 at 0630.  Today, 03/24/2016: INR 1.87, subtherapeutic after holding dose on 6/20 CBC: Hgb is low/stable at 8.4, Plt remain WNL Diet: heart healthy Drug-drug interactions: none  Goal of Therapy:  INR 2-3 Monitor platelets by anticoagulation protocol: Yes   Plan:  Warfarin 6 mg PO x 1 at 1800 Daily PT/INR. Monitor for signs and symptoms of bleeding.  Gretta Arab PharmD, BCPS Pager [phone] 03/24/2016 1:08 PM

## 2016-03-25 DIAGNOSIS — I5022 Chronic systolic (congestive) heart failure: Secondary | ICD-10-CM | POA: Diagnosis not present

## 2016-03-25 DIAGNOSIS — I482 Chronic atrial fibrillation: Secondary | ICD-10-CM | POA: Diagnosis not present

## 2016-03-25 DIAGNOSIS — Z7901 Long term (current) use of anticoagulants: Secondary | ICD-10-CM | POA: Diagnosis not present

## 2016-03-25 DIAGNOSIS — K922 Gastrointestinal hemorrhage, unspecified: Secondary | ICD-10-CM | POA: Diagnosis not present

## 2016-03-25 DIAGNOSIS — I5023 Acute on chronic systolic (congestive) heart failure: Secondary | ICD-10-CM | POA: Diagnosis not present

## 2016-03-25 LAB — BASIC METABOLIC PANEL
ANION GAP: 7 (ref 5–15)
BUN: 29 mg/dL — AB (ref 6–20)
CALCIUM: 8.8 mg/dL — AB (ref 8.9–10.3)
CO2: 31 mmol/L (ref 22–32)
CREATININE: 1.57 mg/dL — AB (ref 0.61–1.24)
Chloride: 102 mmol/L (ref 101–111)
GFR calc Af Amer: 48 mL/min — ABNORMAL LOW (ref >60)
GFR, EST NON AFRICAN AMERICAN: 41 mL/min — AB (ref >60)
GLUCOSE: 87 mg/dL (ref 65–99)
Potassium: 3.8 mmol/L (ref 3.5–5.1)
Sodium: 140 mmol/L (ref 135–145)

## 2016-03-25 LAB — PROTIME-INR
INR: 1.64 — AB (ref 0.00–1.49)
PROTHROMBIN TIME: 19.5 s — AB (ref 11.6–15.2)

## 2016-03-25 LAB — CBC
HCT: 27.7 % — ABNORMAL LOW (ref 39.0–52.0)
Hemoglobin: 8.9 g/dL — ABNORMAL LOW (ref 13.0–17.0)
MCH: 30.7 pg (ref 26.0–34.0)
MCHC: 32.1 g/dL (ref 30.0–36.0)
MCV: 95.5 fL (ref 78.0–100.0)
PLATELETS: 320 10*3/uL (ref 150–400)
RBC: 2.9 MIL/uL — ABNORMAL LOW (ref 4.22–5.81)
RDW: 15.3 % (ref 11.5–15.5)
WBC: 10.4 10*3/uL (ref 4.0–10.5)

## 2016-03-25 MED ORDER — POLYVINYL ALCOHOL 1.4 % OP SOLN
1.0000 [drp] | OPHTHALMIC | Status: DC | PRN
  Filled 2016-03-25: qty 15

## 2016-03-25 MED ORDER — ISOSORB DINITRATE-HYDRALAZINE 20-37.5 MG PO TABS: ORAL_TABLET | ORAL | Status: DC

## 2016-03-25 MED ORDER — WARFARIN SODIUM 5 MG PO TABS
7.5000 mg | ORAL_TABLET | Freq: Once | ORAL | Status: AC
  Administered 2016-03-25: 7.5 mg via ORAL
  Filled 2016-03-25: qty 1

## 2016-03-25 NOTE — Discharge Summary (Signed)
Physician Discharge Summary  [name] [name] [id number] DOB: 09/21/1941 DOA: 03/22/2016  PCP: Claretta Fraise, MD  Admit date: 03/22/2016 Discharge date: 03/25/2016  Time spent: 35 minutes  Recommendations for Outpatient Follow-up:  1. Follow-up with cardiology in 1 week.   Discharge Diagnoses:  Active Problems:   Iron deficiency anemia   Essential hypertension   Chronic systolic CHF (congestive heart failure) (HCC)   Anticoagulant long-term use   Atrial fibrillation, chronic (HCC)   CKD (chronic kidney disease), stage III   GAVE (gastric antral vascular ectasia)   GI bleed   Dyspnea   Heme positive stool   Acute on chronic systolic heart failure (HCC)   Bleeding gastrointestinal   Discharge Condition: stable  Diet recommendation: low sodium fluid resticted  Filed Weights   03/23/16 0500 03/24/16 0541 03/25/16 0622  Weight: 86.9 kg (191 lb 9.3 oz) 84.7 kg (186 lb 11.7 oz) 84.2 kg (185 lb 10 oz)    History of present illness:  75 year old male with past medical history of gave chronic kidney disease stage III A. fib on Coumadin presents to the ED complaining of dyspnea on exertion.  Hospital Course:  Acute on chronic systolic heart failure: He was started on IV Lasix a 2-D echo was done that showed global hypokinesia with an EF of 25%. Cardiology was consulted recommended to continue IV Lasix, BiDil and DC his Norvasc. They relate it is not a candidate for beta blocker due to bradycardia. His estimated dry weight is on 84 kg he will continue his current home dose of Lasix.  Elevated troponins: The patient remained asymptomatic with no chest pain his troponins have remained flat this likely due to demand ischemia no further workup needed.  Iron deficiency anemia: There is no signs of overt bleeding the patient did not complain of any melanotic stools or bright red blood per rectum. His hemoglobin remained stable throughout his hospital stay. GI was consulted they  recommended to continue Coumadin from the GI standpoint. He will continue protonic [employer]	[place]. I overdosed to these were normal ER recommend outpatient VCE  Essential hypertension: Continue Lasix and BiDil.  Long-term anticoagulation use: Resume Coumadin.  Chronic atrial fibrillation: INR therapeutic rate controlled changes were made.  Chronic kidney stage III: Creatinine remained at baseline a candidate for ace due to chronic renal disease.   Procedures:  Echo  Consultations:  Cardiology  Gastroenterology  Discharge Exam: Filed Vitals:   03/24/16 2222 03/25/16 0622  BP: 135/40 131/55  Pulse: 80 74  Temp: 98.1 F (36.7 C) 98.1 F (36.7 C)  Resp: 20 16    General: A&O x3 Cardiovascular: RRR Respiratory: good air movement CTA B/L  Discharge Instructions   Discharge Instructions    Diet - low sodium heart healthy    Complete by:  As directed      Increase activity slowly    Complete by:  As directed           Current Discharge Medication List    CONTINUE these medications which have CHANGED   Details  isosorbide-hydrALAZINE (BIDIL) 20-37.5 MG tablet Take 1 tablet by mouth 3  times daily Qty: 270 tablet, Refills: 0      CONTINUE these medications which have NOT CHANGED   Details  acetaminophen (TYLENOL) 500 MG tablet Take 1,000 mg by mouth every 6 (six) hours as needed for mild pain. For pain.    aspirin EC 81 MG tablet Take 1 tablet (81 mg total) by mouth every Monday, Wednesday, and Friday.  budesonide (PULMICORT) 0.25 MG/2ML nebulizer solution Take 0.25 mg by nebulization 2 (two) times daily.     Cholecalciferol (VITAMIN D3) 5000 UNITS CAPS Take 5,000 Units by mouth daily.     docusate sodium (COLACE) 100 MG capsule Take 100 mg by mouth 2 (two) times daily as needed for mild constipation.    ferrous sulfate 325 (65 FE) MG tablet Take 325 mg by mouth 2 (two) times daily.    furosemide (LASIX) 40 MG tablet Take 1 tablet by mouth   daily Qty: 90 tablet, Refills: 0    HYDROcodone-acetaminophen (NORCO) 5-325 MG tablet Take 1 tablet by mouth every 6 (six) hours as needed for moderate pain. Qty: 30 tablet, Refills: 0    ipratropium-albuterol (DUONEB) 0.5-2.5 (3) MG/3ML SOLN Take 3 mLs by nebulization every 6 (six) hours as needed. For shortness of breath.     levothyroxine (SYNTHROID, LEVOTHROID) 112 MCG tablet Take 1 tablet by mouth  daily before breakfast Qty: 90 tablet, Refills: 0    lovastatin (MEVACOR) 40 MG tablet Take 1 tablet by mouth at  bedtime Qty: 90 tablet, Refills: 0    Multiple Vitamins-Minerals (MULTIVITAMINS THER. W/MINERALS) TABS Take 1 tablet by mouth daily.      nitroGLYCERIN (NITROSTAT) 0.4 MG SL tablet Place 0.4 mg under the tongue every 5 (five) minutes as needed for chest pain.    pantoprazole (PROTONIX) 40 MG tablet TAKE 1 TABLET BY MOUTH  DAILY Qty: 90 tablet, Refills: 1    polyethylene glycol powder (GLYCOLAX/MIRALAX) powder Take 1 capful (17 grams) dissolved in at least 8 ounces water/juice once daily. Qty: 527 g, Refills: 4    potassium chloride (K-DUR,KLOR-CON) 10 MEQ tablet Take 1 tablet by mouth  daily Qty: 90 tablet, Refills: 1    PROAIR HFA 108 (90 Base) MCG/ACT inhaler Use 2 puffs every 6 hours  as needed for shortness of  breath Qty: 34 g, Refills: 3    sucralfate (CARAFATE) 1 g tablet Take 1 tablet (1 g total) by mouth 4 (four) times daily -  with meals and at bedtime. Qty: 360 tablet, Refills: 5    warfarin (COUMADIN) 5 MG tablet Take 1 tablet by mouth  every day or as directed by anticoagulation clinic Qty: 90 tablet, Refills: 1      STOP taking these medications     amLODipine (NORVASC) 10 MG tablet        No Known Allergies    The results of significant diagnostics from this hospitalization (including imaging, microbiology, ancillary and laboratory) are listed below for reference.    Significant Diagnostic Studies: Dg Chest 2 View  03/22/2016  CLINICAL  DATA:  Shortness of breath.  COPD. EXAM: CHEST  2 VIEW COMPARISON:  12/12/2015 FINDINGS: Mild enlargement of the cardiopericardial silhouette with indistinct pulmonary vasculature. No Kerley B-lines. Prior CABG. Atherosclerotic aortic arch. Small bilateral pleural effusions with passive atelectasis. Coronary stents noted. IMPRESSION: 1. Mild enlargement of the cardiopericardial silhouette with evidence of pulmonary venous hypertension but no overt edema. 2. Small bilateral pleural effusions. 3. Prior CABG and coronary stents. 4. Atherosclerotic ascending aorta and aortic arch. Electronically Signed   By: Van Clines M.D.   On: 03/22/2016 14:20    Microbiology: No results found for this or any previous visit (from the past 240 hour(s)).   Labs: Basic Metabolic Panel:  Recent Labs Lab 03/22/16 1333 03/23/16 0347 03/24/16 0439 03/25/16 0433  NA 141 140 142 140  K 4.2 4.2 4.1 3.8  CL 105 105 104  102  CO2 26 27 31 31   GLUCOSE 104* 98 91 87  BUN 25* 23* 23* 29*  CREATININE 1.41* 1.48* 1.49* 1.57*  CALCIUM 9.2 8.6* 8.6* 8.8*   Liver Function Tests:  Recent Labs Lab 03/22/16 1333  AST 20  ALT 15*  ALKPHOS 59  BILITOT 1.3*  PROT 7.8  ALBUMIN 4.8   No results for input(s): LIPASE, AMYLASE in the last 168 hours. No results for input(s): AMMONIA in the last 168 hours. CBC:  Recent Labs Lab 03/22/16 1333 03/23/16 0347 03/23/16 1044 03/24/16 0439 03/25/16 0433  WBC 10.6* 9.3  --  10.5 10.4  NEUTROABS 8.4*  --   --   --   --   HGB 9.8* 8.3* 8.4* 8.4* 8.9*  HCT 30.2* 25.8*  --  26.4* 27.7*  MCV 95.3 95.6  --  97.4 95.5  PLT 342 273  --  304 320   Cardiac Enzymes:  Recent Labs Lab 03/22/16 1333 03/22/16 2144 03/23/16 0347  TROPONINI 0.06* 0.06* 0.06*   BNP: BNP (last 3 results)  Recent Labs  12/07/15 1056 12/15/15 1418 03/22/16 1333  BNP 566.9* 741.2* 520.5*    ProBNP (last 3 results) No results for input(s): PROBNP in the last 8760 hours.  CBG: No  results for input(s): GLUCAP in the last 168 hours.   Signed:  Charlynne Cousins MD.  [facility] 03/25/2016, 11:02 AM

## 2016-03-25 NOTE — Progress Notes (Signed)
ANTICOAGULATION CONSULT NOTE - Follow up [name] [name] for Warfarin Indication: atrial fibrillation  No Known Allergies  Patient Measurements: Height: 5\' 10"  (177.8 cm) Weight: 185 lb 10 oz (84.2 kg) IBW/kg (Calculated) : 73  Vital Signs: Temp: 98.1 F (36.7 C) (06/22 0622) Temp Source: Oral (06/22 0622) BP: 131/55 mmHg (06/22 0622) Pulse Rate: 74 (06/22 0622)  Labs:  Recent Labs  03/22/16 1333 03/22/16 2144 03/23/16 0347 03/23/16 1044 03/24/16 0439 03/24/16 0925 03/25/16 0433  HGB 9.8*  --  8.3* 8.4* 8.4*  --  8.9*  HCT 30.2*  --  25.8*  --  26.4*  --  27.7*  PLT 342  --  273  --  304  --  320  LABPROT 21.9*  --   --  23.4*  --  21.5* 19.5*  INR 2.00*  --   --  2.10*  --  1.87* 1.64*  CREATININE 1.41*  --  1.48*  --  1.49*  --  1.57*  TROPONINI 0.06* 0.06* 0.06*  --   --   --   --     Estimated Creatinine Clearance: 41.3 mL/min (by C-G formula based on Cr of 1.57).   Assessment: 29 yoM presented to ED on 6/19 for c/o low Hgb and dyspnea. PMH includes GAVE disease, CKD-III, Afib on chronic warfarin, COPD, iron deficiency anemia, CHF, GI bleed. Admission Hgb was 9.8, and stool is heme positive, but patient denies melena or other bleeding.  GI recommends PPI, outpatient f/u, and resuming warfarin.  Pharmacy is now consulted by [facility] to resume warfarin dosing.  PTA warfarin dose 5 mg by mouth daily except 2.5 mg on Tues/Thurs. Last dose taken on 6/19 at 0630.  Admission INR = 2.  Today, 03/25/2016: INR 1.64, subtherapeutic and decreased after holding dose on 6/20 CBC: Hgb is low/stable at 8.9, Plt remain WNL Diet: heart healthy Drug-drug interactions: none  Goal of Therapy:  INR 2-3 Monitor platelets by anticoagulation protocol: Yes   Plan:   Warfarin 7.5 mg PO x 1 at 1200  Daily PT/INR.   Monitor for signs and symptoms of bleeding.  For discharge, recommend resuming home regimen 5 mg by mouth daily except 2.5 mg on Tues/Thurs.  Recheck INR in  1 week.  Gretta Arab PharmD, BCPS Pager [phone] 03/25/2016 8:43 AM

## 2016-03-28 DIAGNOSIS — J449 Chronic obstructive pulmonary disease, unspecified: Secondary | ICD-10-CM | POA: Diagnosis not present

## 2016-03-29 DIAGNOSIS — J449 Chronic obstructive pulmonary disease, unspecified: Secondary | ICD-10-CM | POA: Diagnosis not present

## 2016-03-31 ENCOUNTER — Telehealth: Payer: Self-pay

## 2016-03-31 ENCOUNTER — Other Ambulatory Visit: Payer: Self-pay

## 2016-03-31 ENCOUNTER — Other Ambulatory Visit (INDEPENDENT_AMBULATORY_CARE_PROVIDER_SITE_OTHER): Payer: Medicare Other

## 2016-03-31 DIAGNOSIS — R195 Other fecal abnormalities: Secondary | ICD-10-CM

## 2016-03-31 DIAGNOSIS — D509 Iron deficiency anemia, unspecified: Secondary | ICD-10-CM

## 2016-03-31 LAB — CBC WITH DIFFERENTIAL/PLATELET
BASOS ABS: 0.1 10*3/uL (ref 0.0–0.1)
BASOS PCT: 0.8 % (ref 0.0–3.0)
EOS ABS: 0.1 10*3/uL (ref 0.0–0.7)
EOS PCT: 1.3 % (ref 0.0–5.0)
HEMATOCRIT: 28.4 % — AB (ref 39.0–52.0)
HEMOGLOBIN: 9.2 g/dL — AB (ref 13.0–17.0)
LYMPHS ABS: 1.3 10*3/uL (ref 0.7–4.0)
Lymphocytes Relative: 14.9 % (ref 12.0–46.0)
MCHC: 32.2 g/dL (ref 30.0–36.0)
MCV: 93.3 fl (ref 78.0–100.0)
MONOS PCT: 11 % (ref 3.0–12.0)
Monocytes Absolute: 1 10*3/uL (ref 0.1–1.0)
NEUTROS ABS: 6.4 10*3/uL (ref 1.4–7.7)
Neutrophils Relative %: 72 % (ref 43.0–77.0)
Platelets: 278 10*3/uL (ref 150.0–400.0)
RBC: 3.05 Mil/uL — ABNORMAL LOW (ref 4.22–5.81)
RDW: 15.9 % — AB (ref 11.5–15.5)
WBC: 8.9 10*3/uL (ref 4.0–10.5)

## 2016-03-31 NOTE — Telephone Encounter (Signed)
Pt scheduled for capsule endo 04/14/16. Prep reviewed with pt and left up front for him to pickup.

## 2016-03-31 NOTE — Telephone Encounter (Signed)
Pt aware and will come for CBC, order in epic. Pt scheduled for capsule 04/14/16.

## 2016-03-31 NOTE — Telephone Encounter (Signed)
-----   Message from Jerene Bears, MD sent at 03/23/2016  5:38 PM EDT ----- Regarding: VCE and CBC Pt need VCE.  Can be done on warfarin. 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

## 2016-04-01 ENCOUNTER — Other Ambulatory Visit: Payer: Self-pay

## 2016-04-01 DIAGNOSIS — D509 Iron deficiency anemia, unspecified: Secondary | ICD-10-CM

## 2016-04-08 ENCOUNTER — Encounter: Payer: Self-pay | Admitting: Family Medicine

## 2016-04-08 ENCOUNTER — Ambulatory Visit (INDEPENDENT_AMBULATORY_CARE_PROVIDER_SITE_OTHER): Payer: Medicare Other | Admitting: Family Medicine

## 2016-04-08 VITALS — BP 146/59 | HR 72 | Temp 97.1°F | Ht 70.0 in | Wt 185.6 lb

## 2016-04-08 DIAGNOSIS — K922 Gastrointestinal hemorrhage, unspecified: Secondary | ICD-10-CM

## 2016-04-08 DIAGNOSIS — I482 Chronic atrial fibrillation, unspecified: Secondary | ICD-10-CM

## 2016-04-08 DIAGNOSIS — I5022 Chronic systolic (congestive) heart failure: Secondary | ICD-10-CM

## 2016-04-08 DIAGNOSIS — D509 Iron deficiency anemia, unspecified: Secondary | ICD-10-CM | POA: Diagnosis not present

## 2016-04-08 LAB — COAGUCHEK XS/INR WAIVED
INR: 3.2 — ABNORMAL HIGH (ref 0.9–1.1)
PROTHROMBIN TIME: 38.6 s

## 2016-04-08 NOTE — Progress Notes (Signed)
   HPI  Patient presents today for follow-up.  Patient was hospitalized on 03/22/2016 for dyspnea on exertion, he was found to have volume overload and anemia.  He was diuresed and had improvement in symptoms.  He still has some mild dyspnea on exertion which is improved since hospitalization.  A. fib Good Coumadin compliance, has not seen any blood in his stool, has history of gastritis and AVM. Has EGD scheduled for about a week from now.  Anemia On 2 iron pills daily. No sources of bleeding  PMH: Smoking status noted ROS: Per HPI  Objective: BP 146/59 mmHg  Pulse 72  Temp(Src) 97.1 F (36.2 C) (Oral)  Ht '5\' 10"'$  (1.778 m)  Wt 185 lb 9.6 oz (84.188 kg)  BMI 26.63 kg/m2 Gen: NAD, alert, cooperative with exam HEENT: NCAT CV: Irregularly irregular, normal rate Resp: CTABL, no wheezes, non-labored Ext: No edema, warm Neuro: Alert and oriented, No gross deficits  Skin Solar purpura/senile purpura on bilateral arms  Assessment and plan:  # A. fib Rate controlled, INR is supratherapeutic, see below Decrease Coumadin dose to 30 mg weekly, one half pill on Tuesday and Thursday, otherwise 1 full pill F/u INR recheck in  2 weeks   # Systolic CHF Appears euvolemic today Continue current dose of Lasix Labs  # Iron deficiency anemia, upper GI bleed Has EGD scheduled, rechecking labs Continue iron replacement   Orders Placed This Encounter  Procedures  . CBC with Differential  . CMP14+EGFR     Laroy Apple, MD [facility] 04/08/2016, 2:11 PM

## 2016-04-08 NOTE — Patient Instructions (Signed)
Great to see you!  Cut your coumadin back to 1/2 pill on Tuesday and Thursday and 1 whole pill on the other days   Come back to see Tammy  in 2 weeks, come back to see Dr. Livia Snellen in 3 months

## 2016-04-09 ENCOUNTER — Ambulatory Visit: Payer: Medicare Other | Admitting: Family Medicine

## 2016-04-09 LAB — CBC WITH DIFFERENTIAL/PLATELET
BASOS ABS: 0.1 10*3/uL (ref 0.0–0.2)
BASOS: 1 %
EOS (ABSOLUTE): 0.2 10*3/uL (ref 0.0–0.4)
Eos: 2 %
Hematocrit: 30.1 % — ABNORMAL LOW (ref 37.5–51.0)
Hemoglobin: 9.4 g/dL — ABNORMAL LOW (ref 12.6–17.7)
IMMATURE GRANS (ABS): 0 10*3/uL (ref 0.0–0.1)
Immature Granulocytes: 0 %
LYMPHS ABS: 1 10*3/uL (ref 0.7–3.1)
Lymphs: 12 %
MCH: 29.8 pg (ref 26.6–33.0)
MCHC: 31.2 g/dL — AB (ref 31.5–35.7)
MCV: 96 fL (ref 79–97)
MONOS ABS: 1.1 10*3/uL — AB (ref 0.1–0.9)
Monocytes: 13 %
NEUTROS ABS: 6.2 10*3/uL (ref 1.4–7.0)
Neutrophils: 72 %
PLATELETS: 305 10*3/uL (ref 150–379)
RBC: 3.15 x10E6/uL — ABNORMAL LOW (ref 4.14–5.80)
RDW: 14.9 % (ref 12.3–15.4)
WBC: 8.5 10*3/uL (ref 3.4–10.8)

## 2016-04-09 LAB — CMP14+EGFR
A/G RATIO: 1.7 (ref 1.2–2.2)
ALK PHOS: 62 IU/L (ref 39–117)
ALT: 9 IU/L (ref 0–44)
AST: 16 IU/L (ref 0–40)
Albumin: 4.3 g/dL (ref 3.5–4.8)
BILIRUBIN TOTAL: 0.3 mg/dL (ref 0.0–1.2)
BUN / CREAT RATIO: 14 (ref 10–24)
BUN: 20 mg/dL (ref 8–27)
CHLORIDE: 102 mmol/L (ref 96–106)
CO2: 25 mmol/L (ref 18–29)
Calcium: 9.4 mg/dL (ref 8.6–10.2)
Creatinine, Ser: 1.46 mg/dL — ABNORMAL HIGH (ref 0.76–1.27)
GFR calc non Af Amer: 46 mL/min/{1.73_m2} — ABNORMAL LOW (ref >59)
GFR, EST AFRICAN AMERICAN: 53 mL/min/{1.73_m2} — AB (ref >59)
GLUCOSE: 88 mg/dL (ref 65–99)
Globulin, Total: 2.5 g/dL (ref 1.5–4.5)
POTASSIUM: 4.5 mmol/L (ref 3.5–5.2)
Sodium: 144 mmol/L (ref 134–144)
TOTAL PROTEIN: 6.8 g/dL (ref 6.0–8.5)

## 2016-04-14 ENCOUNTER — Ambulatory Visit (INDEPENDENT_AMBULATORY_CARE_PROVIDER_SITE_OTHER): Payer: Medicare Other | Admitting: Internal Medicine

## 2016-04-14 ENCOUNTER — Telehealth: Payer: Self-pay | Admitting: Internal Medicine

## 2016-04-14 DIAGNOSIS — R195 Other fecal abnormalities: Secondary | ICD-10-CM

## 2016-04-14 DIAGNOSIS — D509 Iron deficiency anemia, unspecified: Secondary | ICD-10-CM

## 2016-04-14 DIAGNOSIS — D649 Anemia, unspecified: Secondary | ICD-10-CM

## 2016-04-14 NOTE — Progress Notes (Signed)
Pt tolerated capsule well, verbalized all written and verbal instruction  Lot [phone] Exp 06/25/2017 [facility]

## 2016-04-14 NOTE — Telephone Encounter (Signed)
Pt aware he can have a bowel movement as needed

## 2016-04-20 ENCOUNTER — Encounter: Payer: Self-pay | Admitting: Internal Medicine

## 2016-04-22 ENCOUNTER — Ambulatory Visit (INDEPENDENT_AMBULATORY_CARE_PROVIDER_SITE_OTHER): Payer: Medicare Other | Admitting: Family

## 2016-04-22 ENCOUNTER — Ambulatory Visit (INDEPENDENT_AMBULATORY_CARE_PROVIDER_SITE_OTHER): Payer: Medicare Other

## 2016-04-22 ENCOUNTER — Other Ambulatory Visit: Payer: Self-pay | Admitting: Pharmacist

## 2016-04-22 ENCOUNTER — Encounter: Payer: Self-pay | Admitting: Pharmacist

## 2016-04-22 VITALS — BP 152/67 | HR 95 | Temp 97.4°F | Ht 70.0 in | Wt 187.0 lb

## 2016-04-22 DIAGNOSIS — B349 Viral infection, unspecified: Secondary | ICD-10-CM

## 2016-04-22 DIAGNOSIS — R509 Fever, unspecified: Secondary | ICD-10-CM

## 2016-04-22 DIAGNOSIS — I482 Chronic atrial fibrillation, unspecified: Secondary | ICD-10-CM

## 2016-04-22 DIAGNOSIS — D509 Iron deficiency anemia, unspecified: Secondary | ICD-10-CM | POA: Diagnosis not present

## 2016-04-22 LAB — COAGUCHEK XS/INR WAIVED
INR: 3.4 — AB (ref 0.9–1.1)
Prothrombin Time: 40.7 s

## 2016-04-22 NOTE — Progress Notes (Signed)
Subjective:    Patient ID: [name] [name], male    DOB: 1941-09-05, 75 y.o.   MRN: [id number]  Pt presents to the office today for fever that started three days ago. PT states the fever has resolved. Pt states he has taken Tylenol. Fever  This is a new problem. The current episode started in the past 7 days. The problem occurs intermittently. The problem has been waxing and waning. The maximum temperature noted was 102 to 102.9 F. Associated symptoms include ear pain, muscle aches, sleepiness and wheezing. Pertinent negatives include no congestion, coughing, diarrhea, headaches, nausea, sore throat, urinary pain or vomiting. He has tried acetaminophen for the symptoms. The treatment provided mild relief.      Review of Systems  Constitutional: Positive for fever.  HENT: Positive for ear pain. Negative for congestion and sore throat.   Respiratory: Positive for wheezing. Negative for cough.   Cardiovascular: Negative.   Gastrointestinal: Negative.  Negative for nausea, vomiting and diarrhea.  Endocrine: Negative.   Genitourinary: Negative.  Negative for dysuria.  Musculoskeletal: Negative.   Neurological: Negative.  Negative for headaches.  Hematological: Negative.   Psychiatric/Behavioral: Negative.   All other systems reviewed and are negative.      Objective:   Physical Exam  Constitutional: He is oriented to person, place, and time. He appears well-developed and well-nourished. No distress.  HENT:  Head: Normocephalic.  Right Ear: External ear normal.  Left Ear: External ear normal.  Mouth/Throat: Oropharynx is clear and moist.  Nasal passage erythemas with mild swelling    Eyes: Pupils are equal, round, and reactive to light. Right eye exhibits no discharge. Left eye exhibits no discharge.  Neck: Normal range of motion. Neck supple. No thyromegaly present.  Cardiovascular: Normal rate, regular rhythm, normal heart sounds and intact distal pulses.   No murmur  heard. Pulmonary/Chest: Effort normal. No respiratory distress. He has no wheezes.  Diminished breath sounds   Abdominal: Soft. Bowel sounds are normal. He exhibits no distension. There is no tenderness.  Musculoskeletal: Normal range of motion. He exhibits no edema or tenderness.  Neurological: He is alert and oriented to person, place, and time.  Skin: Skin is warm and dry. No rash noted. No erythema.  Psychiatric: He has a normal mood and affect. His behavior is normal. Judgment and thought content normal.  Vitals reviewed.  Chest xray- No pneumonia  Preliminary reading by Evelina Dun, FNP [facility]    BP 152/67 mmHg  Pulse 95  Temp(Src) 97.4 F (36.3 C) (Oral)  Ht 5\' 10"  (1.778 m)  Wt 187 lb (84.823 kg)  BMI 26.83 kg/m2  SpO2 96%     Assessment & Plan:  1. Atrial fibrillation, chronic (HCC) - CoaguChek XS/INR Waived Stable Anticoagulation Dose Instructions as of 04/22/2016      Sun Mon Tue Wed Thu Fri Sat   New Dose 2.5 mg 5 mg 2.5 mg 5 mg 2.5 mg 5 mg 2.5 mg    Description        Goal INR 1.8-2.0 No warfarin tomorrow - Friday, June 21st.  Then start new warfarin dose of 5mg  tablet - take 1 tablet mondays, wednesdays and fridays.  Take 1/2 tablet all other days.       2. IDA (iron deficiency anemia) - IBC Panel - CBC with Differential/Platelet - Ferritin  3. Other specified fever - Urinalysis, Complete - DG Chest 2 View  4. Fever, unspecified fever cause  5. Viral illness  Force fluids Tylenol prn  for fever RTO prn   Evelina Dun, FNP

## 2016-04-22 NOTE — Patient Instructions (Addendum)
Anticoagulation Dose Instructions as of 04/22/2016      [name] [name] Tue Wed Thu Fri Sat   New Dose 2.5 mg 5 mg 2.5 mg 5 mg 2.5 mg 5 mg 2.5 mg    Description        Goal INR 1.8-2.0 No warfarin tomorrow - Friday, June 21st.  Then start new warfarin dose of 5mg  tablet - take 1 tablet mondays, wednesdays and fridays.  Take 1/2 tablet all other days.     INR was 3.4 today  Viral Infections A virus is a type of germ. Viruses can cause:  Minor sore throats.  Aches and pains.  Headaches.  Runny nose.  Rashes.  Watery eyes.  Tiredness.  Coughs.  Loss of appetite.  Feeling sick to your stomach (nausea).  Throwing up (vomiting).  Watery poop (diarrhea). HOME CARE   Only take medicines as told by your doctor.  Drink enough water and fluids to keep your pee (urine) clear or pale yellow. Sports drinks are a good choice.  Get plenty of rest and eat healthy. Soups and broths with crackers or rice are fine. GET HELP RIGHT AWAY IF:   You have a very bad headache.  You have shortness of breath.  You have chest pain or neck pain.  You have an unusual rash.  You cannot stop throwing up.  You have watery poop that does not stop.  You cannot keep fluids down.  You or your child has a temperature by mouth above 102 F (38.9 C), not controlled by medicine.  Your baby is older than 3 months with a rectal temperature of 102 F (38.9 C) or higher.  Your baby is 102 months old or younger with a rectal temperature of 100.4 F (38 C) or higher. MAKE SURE YOU:   Understand these instructions.  Will watch this condition.  Will get help right away if you are not doing well or get worse.   This information is not intended to replace advice given to you by your health care provider. Make sure you discuss any questions you have with your health care provider.   Document Released: 09/02/2008 Document Revised: 12/13/2011 Document Reviewed: 02/26/2015 Elsevier Interactive Patient  Education [employer].

## 2016-04-23 ENCOUNTER — Telehealth: Payer: Self-pay

## 2016-04-23 ENCOUNTER — Telehealth: Payer: Self-pay | Admitting: Family

## 2016-04-23 DIAGNOSIS — J189 Pneumonia, unspecified organism: Secondary | ICD-10-CM

## 2016-04-23 DIAGNOSIS — J449 Chronic obstructive pulmonary disease, unspecified: Secondary | ICD-10-CM | POA: Diagnosis not present

## 2016-04-23 LAB — CBC WITH DIFFERENTIAL/PLATELET
BASOS: 1 %
Basophils Absolute: 0.1 10*3/uL (ref 0.0–0.2)
EOS (ABSOLUTE): 0.7 10*3/uL — ABNORMAL HIGH (ref 0.0–0.4)
EOS: 6 %
HEMATOCRIT: 28.1 % — AB (ref 37.5–51.0)
HEMOGLOBIN: 9 g/dL — AB (ref 12.6–17.7)
IMMATURE GRANS (ABS): 0 10*3/uL (ref 0.0–0.1)
Immature Granulocytes: 0 %
LYMPHS ABS: 0.6 10*3/uL — AB (ref 0.7–3.1)
LYMPHS: 5 %
MCH: 29.7 pg (ref 26.6–33.0)
MCHC: 32 g/dL (ref 31.5–35.7)
MCV: 93 fL (ref 79–97)
MONOCYTES: 12 %
Monocytes Absolute: 1.3 10*3/uL — ABNORMAL HIGH (ref 0.1–0.9)
NEUTROS ABS: 8.3 10*3/uL — AB (ref 1.4–7.0)
NEUTROS PCT: 76 %
Platelets: 345 10*3/uL (ref 150–379)
RBC: 3.03 x10E6/uL — ABNORMAL LOW (ref 4.14–5.80)
RDW: 14.8 % (ref 12.3–15.4)
WBC: 10.9 10*3/uL — ABNORMAL HIGH (ref 3.4–10.8)

## 2016-04-23 LAB — FERRITIN: FERRITIN: 108 ng/mL (ref 30–400)

## 2016-04-23 MED ORDER — PANTOPRAZOLE SODIUM 40 MG PO TBEC: 40.0000 mg | DELAYED_RELEASE_TABLET | Freq: Two times a day (BID) | ORAL | Status: DC

## 2016-04-23 MED ORDER — PREDNISONE 10 MG (21) PO TBPK: 10.0000 mg | ORAL_TABLET | Freq: Every day | ORAL | Status: DC

## 2016-04-23 MED ORDER — AZITHROMYCIN 250 MG PO TABS: ORAL_TABLET | ORAL | Status: DC

## 2016-04-23 NOTE — Telephone Encounter (Signed)
Patient notified of results of capsule endoscopy and recommendations According to procedure report patient needs to be on PPI BID.  Currently taking daily.  I will send in new RX for pantoprazole BID He requests that I send this to [facility].

## 2016-04-23 NOTE — Telephone Encounter (Signed)
Zpak and prednisone Prescription sent to pharmacy. Pt notified of results

## 2016-04-26 ENCOUNTER — Encounter: Payer: Self-pay | Admitting: Endocrinology

## 2016-04-27 DIAGNOSIS — J449 Chronic obstructive pulmonary disease, unspecified: Secondary | ICD-10-CM | POA: Diagnosis not present

## 2016-04-28 LAB — IRON AND TIBC
Iron Saturation: 5 % — CL (ref 15–55)
Iron: 14 ug/dL — ABNORMAL LOW (ref 38–169)
Total Iron Binding Capacity: 274 ug/dL (ref 250–450)
UIBC: 260 ug/dL (ref 111–343)

## 2016-04-28 LAB — SPECIMEN STATUS REPORT

## 2016-04-30 ENCOUNTER — Other Ambulatory Visit: Payer: Self-pay | Admitting: Family Medicine

## 2016-04-30 ENCOUNTER — Other Ambulatory Visit: Payer: Self-pay | Admitting: Interventional Cardiology

## 2016-04-30 NOTE — Telephone Encounter (Signed)
Medication  amLODipine (NORVASC) 10 MG tablet [[id number]]  amLODipine (NORVASC) 10 MG tablet [id number] DISCONTINUED  Order Details  Dose: 10 mg Route: Oral Frequency: Daily  Dispense Quantity:  90 tablet Refills:  2 Fills remaining:  --        Sig: Take 1 tablet (10 mg total) by mouth daily.       Discontinue Date:  03/25/2016 1337 Discontinue User:  Charlynne Cousins, MD Discontinue Reason:  Stop Taking at Discharge  Written Date:  10/28/15 Expiration Date:  10/27/16    Start Date:  10/28/15 End Date:  03/25/16         Ordering Provider:  Belva Crome, MD DEA #:  [id number] NPI:  [id number]   Authorizing Provider:  Belva Crome, MD DEA #:  [id number] NPI:  [id number]   Ordering User:  Juventino Slovak, CMA          Diagnosis Association: Chronic systolic heart failure (HCC) (I50.22); Chronic atrial fibrillation (HCC) (I48.2)    Original Order:  amLODipine (NORVASC) 10 MG tablet [id number]    Pharmacy:  [facility] DEA #:  --    Pharmacy Comments:  --       Fill quantity remaining:  -- Fill quantity used:  --

## 2016-05-04 LAB — MICROSCOPIC EXAMINATION
Bacteria, UA: NONE SEEN
RBC, UA: NONE SEEN /hpf (ref 0–?)

## 2016-05-04 LAB — URINALYSIS, COMPLETE
BILIRUBIN UA: NEGATIVE
GLUCOSE, UA: NEGATIVE
KETONES UA: NEGATIVE
LEUKOCYTES UA: NEGATIVE
Nitrite, UA: NEGATIVE
RBC UA: NEGATIVE
SPEC GRAV UA: 1.01 (ref 1.005–1.030)
Urobilinogen, Ur: 0.2 mg/dL (ref 0.2–1.0)
pH, UA: 5 (ref 5.0–7.5)

## 2016-05-05 ENCOUNTER — Other Ambulatory Visit (INDEPENDENT_AMBULATORY_CARE_PROVIDER_SITE_OTHER): Payer: Medicare Other

## 2016-05-05 ENCOUNTER — Other Ambulatory Visit: Payer: Self-pay

## 2016-05-05 DIAGNOSIS — D649 Anemia, unspecified: Secondary | ICD-10-CM

## 2016-05-05 LAB — CBC WITH DIFFERENTIAL/PLATELET
Basophils Absolute: 0.1 10*3/uL (ref 0.0–0.1)
Basophils Relative: 0.9 % (ref 0.0–3.0)
EOS ABS: 0.6 10*3/uL (ref 0.0–0.7)
EOS PCT: 4.6 % (ref 0.0–5.0)
HCT: 29.3 % — ABNORMAL LOW (ref 39.0–52.0)
HEMOGLOBIN: 9.4 g/dL — AB (ref 13.0–17.0)
LYMPHS ABS: 0.8 10*3/uL (ref 0.7–4.0)
Lymphocytes Relative: 6.7 % — ABNORMAL LOW (ref 12.0–46.0)
MCHC: 32.2 g/dL (ref 30.0–36.0)
MCV: 90.2 fl (ref 78.0–100.0)
MONO ABS: 1 10*3/uL (ref 0.1–1.0)
Monocytes Relative: 8.5 % (ref 3.0–12.0)
NEUTROS PCT: 79.3 % — AB (ref 43.0–77.0)
Neutro Abs: 9.8 10*3/uL — ABNORMAL HIGH (ref 1.4–7.7)
Platelets: 564 10*3/uL — ABNORMAL HIGH (ref 150.0–400.0)
RBC: 3.24 Mil/uL — AB (ref 4.22–5.81)
RDW: 15.6 % — ABNORMAL HIGH (ref 11.5–15.5)
WBC: 12.4 10*3/uL — AB (ref 4.0–10.5)

## 2016-05-10 ENCOUNTER — Ambulatory Visit (INDEPENDENT_AMBULATORY_CARE_PROVIDER_SITE_OTHER): Payer: Medicare Other | Admitting: Pharmacist

## 2016-05-10 VITALS — BP 134/52 | HR 60

## 2016-05-10 DIAGNOSIS — K31819 Angiodysplasia of stomach and duodenum without bleeding: Secondary | ICD-10-CM

## 2016-05-10 DIAGNOSIS — D509 Iron deficiency anemia, unspecified: Secondary | ICD-10-CM

## 2016-05-10 DIAGNOSIS — I482 Chronic atrial fibrillation, unspecified: Secondary | ICD-10-CM

## 2016-05-10 LAB — COAGUCHEK XS/INR WAIVED
INR: 2.2 — AB (ref 0.9–1.1)
Prothrombin Time: 26.7 s

## 2016-05-11 ENCOUNTER — Other Ambulatory Visit: Payer: Self-pay | Admitting: Family Medicine

## 2016-05-28 DIAGNOSIS — J449 Chronic obstructive pulmonary disease, unspecified: Secondary | ICD-10-CM | POA: Diagnosis not present

## 2016-06-01 ENCOUNTER — Ambulatory Visit: Payer: Self-pay | Admitting: Pharmacist

## 2016-06-02 ENCOUNTER — Other Ambulatory Visit: Payer: Self-pay | Admitting: Interventional Cardiology

## 2016-06-02 NOTE — Telephone Encounter (Signed)
amLODipine (NORVASC) 10 MG tablet [[id number]] DISCONTINUED  Order Details  Dose: 10 mg Route: Oral Frequency: Daily  Dispense Quantity:  90 tablet Refills:  2 Fills remaining:  --        Sig: Take 1 tablet (10 mg total) by mouth daily.       Discontinue Date:  03/25/2016 1337 Discontinue User:  Abraham Feliz Ortiz, MD Discontinue Reason:  Stop Taking at Discharge  Written Date:  10/28/15 Expiration Date:  10/27/16    Start Date:  10/28/15 End Date:  03/25/16         Ordering Provider:  Henry W Smith, MD DEA #:  [id number] NPI:  [id number]   Authorizing Provider:  Henry W Smith, MD DEA #:  [id number] NPI:  [id number]       

## 2016-06-07 IMAGING — CR DG CHEST 2V
2 series · 2 of 2 positions shown · non-contrast
Comparison: 12/25/2014

CLINICAL DATA: Cough. Weakness. Fever. Shortness of breath COPD.
Asthma. Coronary artery disease.

EXAM:
CHEST  2 VIEW

[w chest pa]
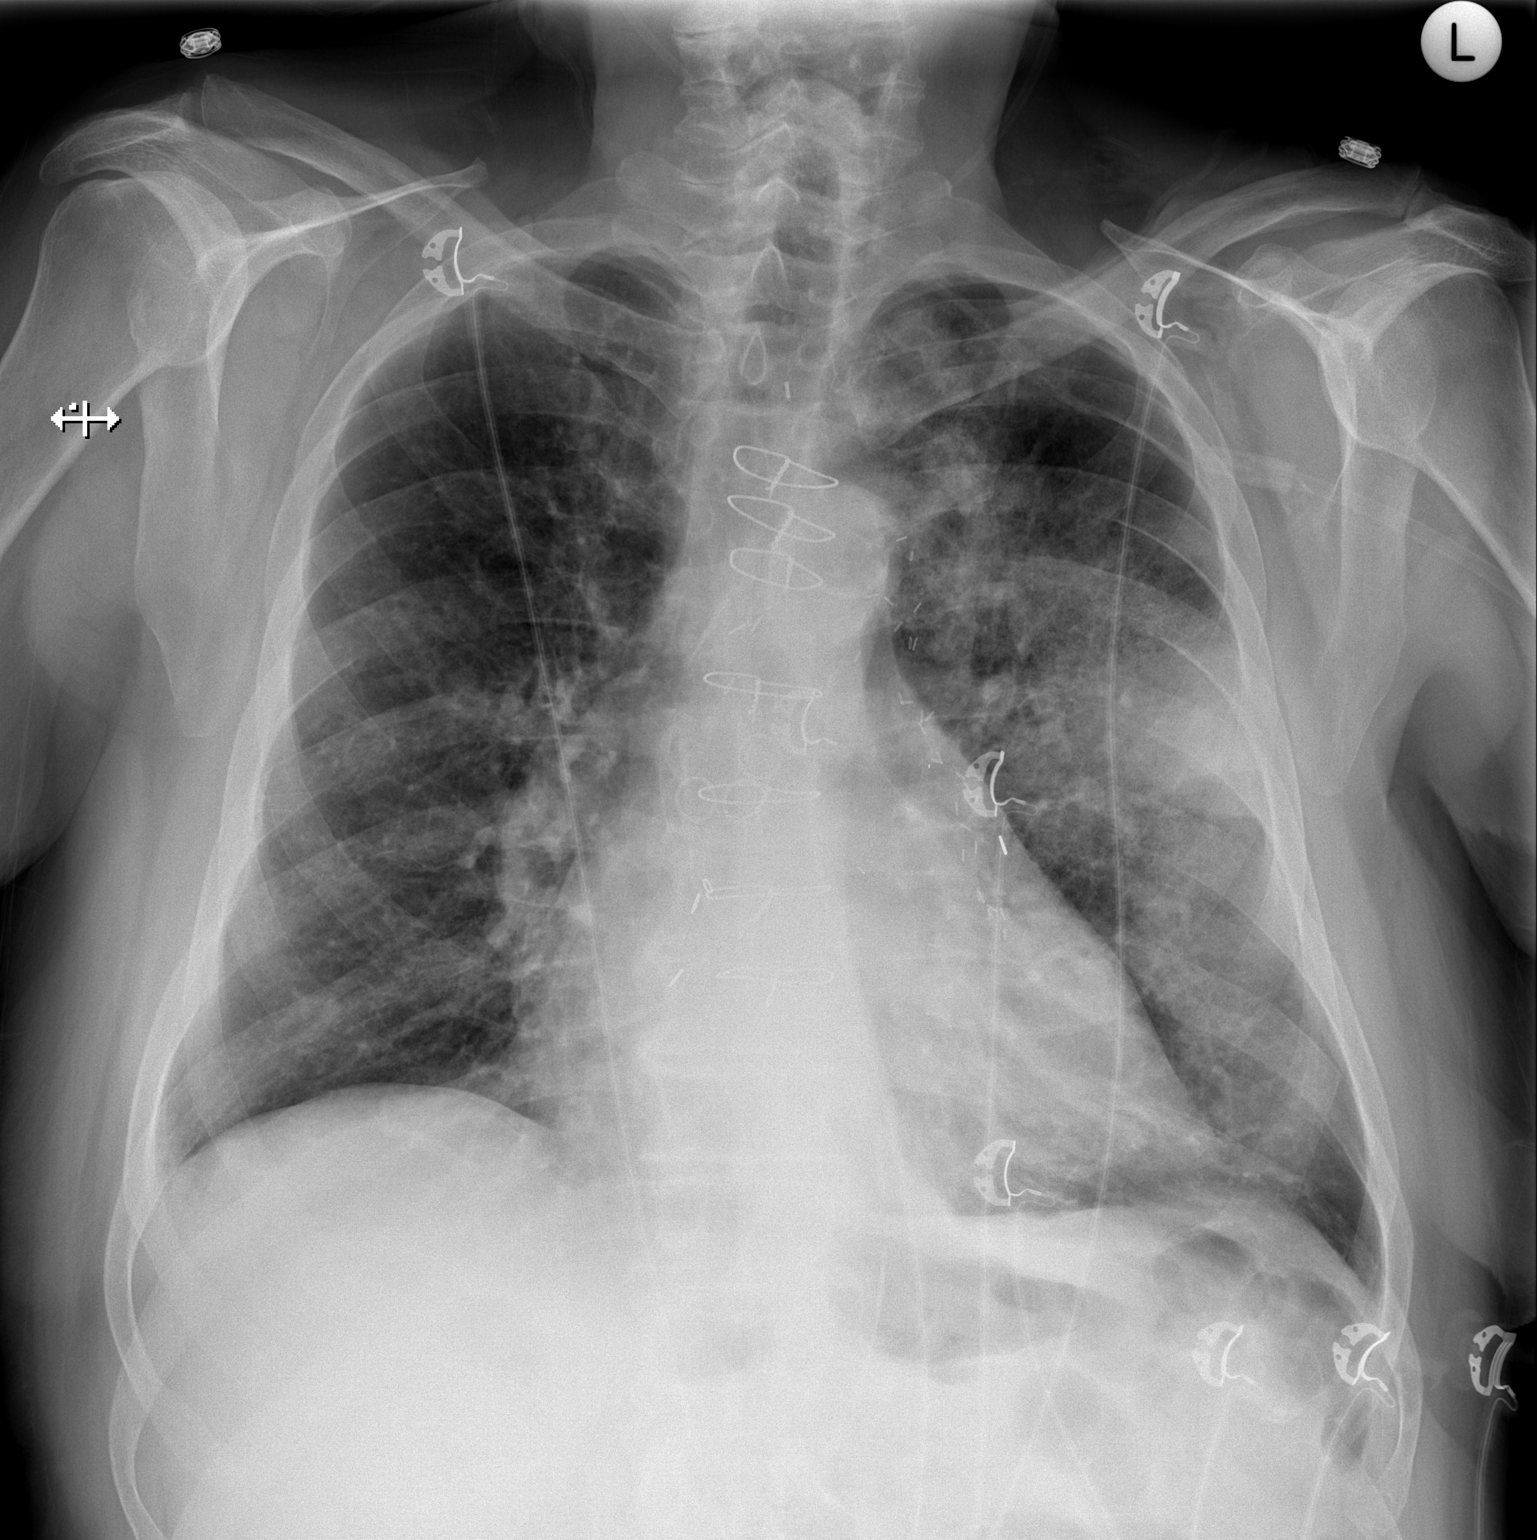

[w chest lat]
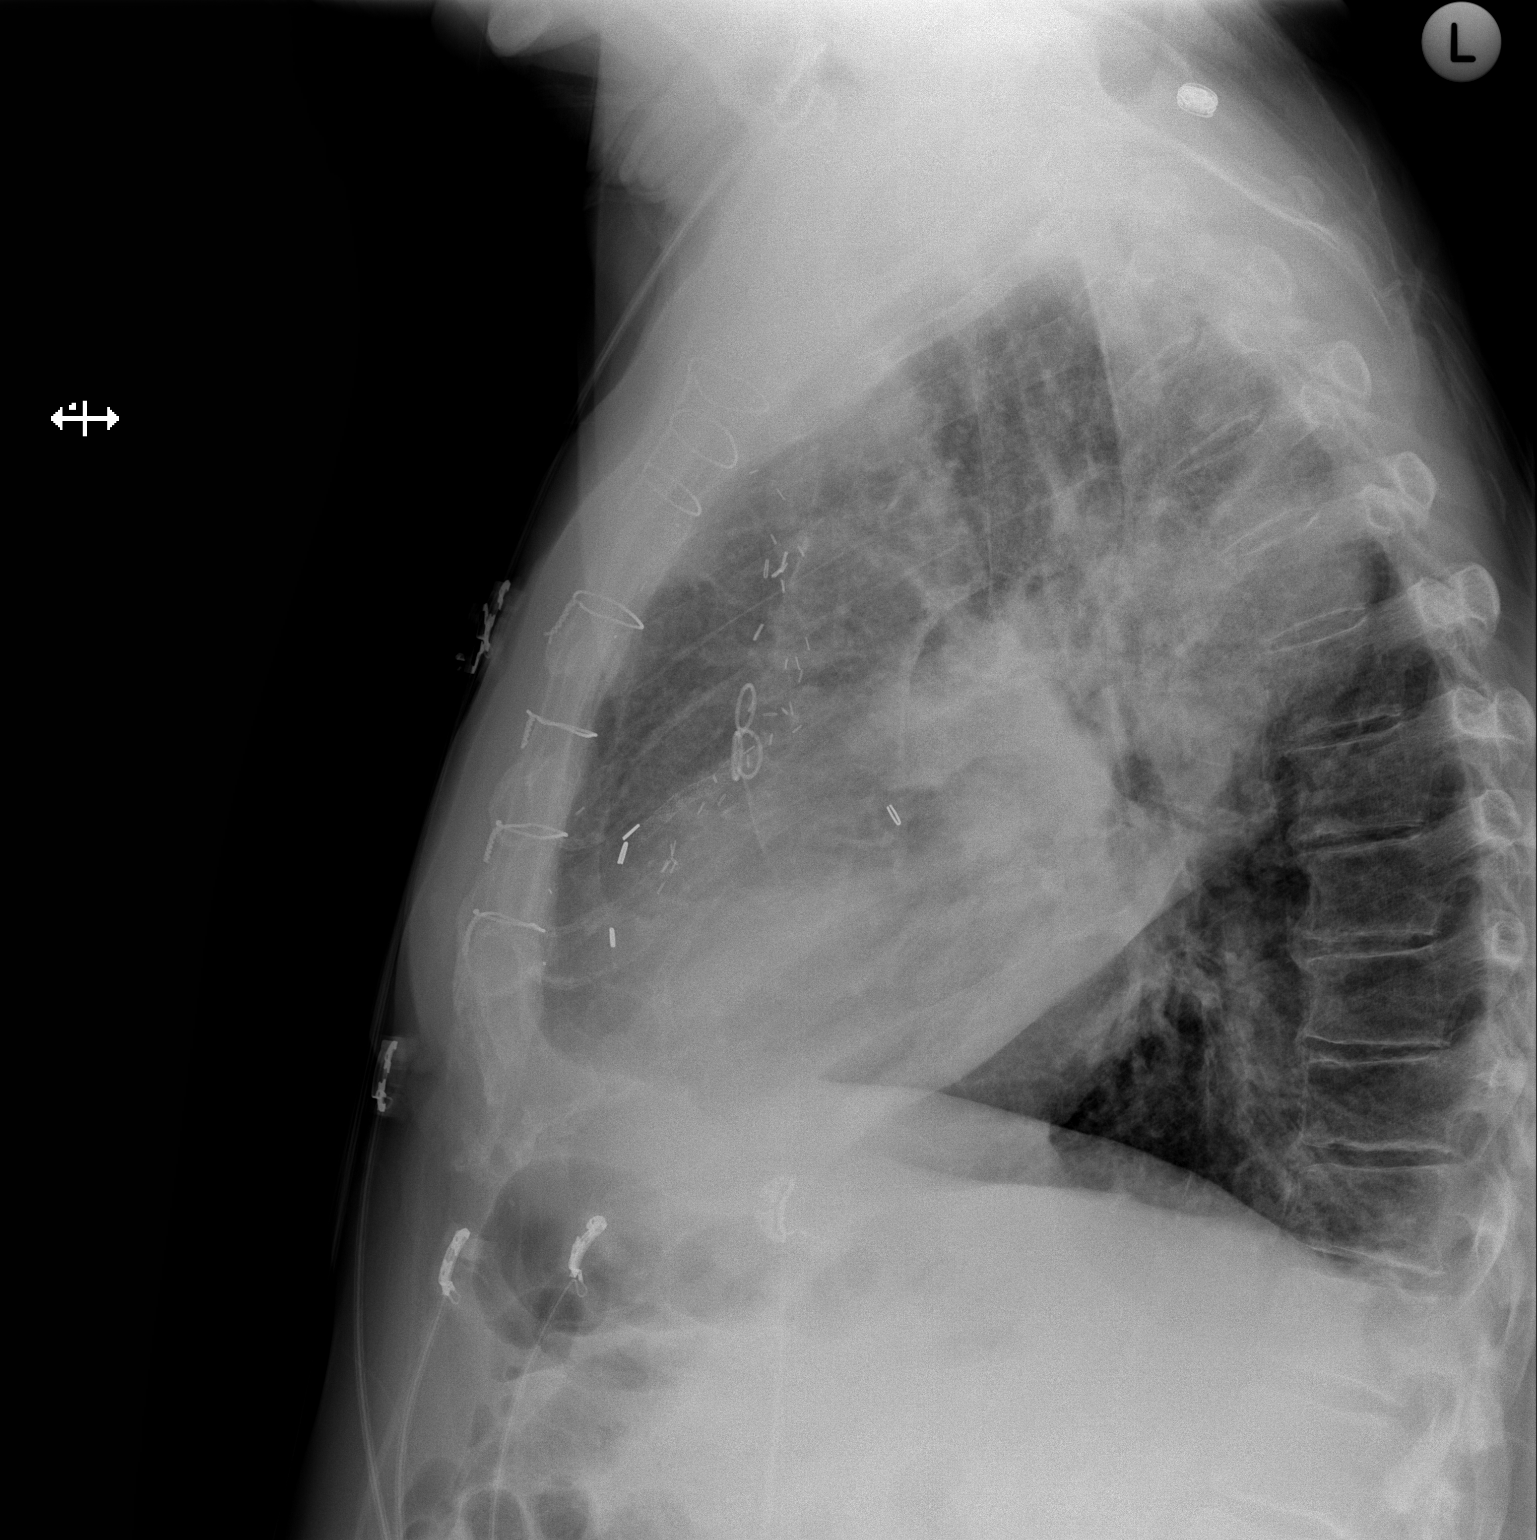

[2 of 2 positions shown; findings below may reference images not displayed]

FINDINGS: Prior median sternotomy. Midline trachea. Moderate cardiomegaly.
Mild right hemidiaphragm elevation. No pleural effusion or
pneumothorax. Diffuse peribronchial thickening. Left upper lobe
patchy airspace disease.
IMPRESSION: Left upper lobe airspace disease, most consistent with pneumonia.
Followup PA and lateral chest X-ray is recommended in 3-4 weeks
following trial of antibiotic therapy to ensure resolution and
exclude underlying malignancy.

Underlying cardiomegaly with chronic interstitial thickening, likely
related to COPD/ chronic bronchitis.

## 2016-06-08 ENCOUNTER — Other Ambulatory Visit: Payer: Self-pay | Admitting: Interventional Cardiology

## 2016-06-10 DIAGNOSIS — J449 Chronic obstructive pulmonary disease, unspecified: Secondary | ICD-10-CM | POA: Diagnosis not present

## 2016-06-14 ENCOUNTER — Encounter: Payer: Self-pay | Admitting: Pharmacist

## 2016-06-14 ENCOUNTER — Ambulatory Visit (INDEPENDENT_AMBULATORY_CARE_PROVIDER_SITE_OTHER): Payer: Medicare Other | Admitting: Pharmacist

## 2016-06-14 VITALS — BP 140/62 | HR 68 | Ht 70.0 in | Wt 186.5 lb

## 2016-06-14 DIAGNOSIS — Z0000 Encounter for general adult medical examination without abnormal findings: Secondary | ICD-10-CM | POA: Diagnosis not present

## 2016-06-14 DIAGNOSIS — I482 Chronic atrial fibrillation, unspecified: Secondary | ICD-10-CM

## 2016-06-14 LAB — COAGUCHEK XS/INR WAIVED
INR: 1.8 — ABNORMAL HIGH (ref 0.9–1.1)
Prothrombin Time: 22.1 s

## 2016-06-14 NOTE — Patient Instructions (Addendum)
  Mr. Nyland , Thank you for taking time to come for your Medicare Wellness Visit. I appreciate your ongoing commitment to your health goals. Please review the following plan we discussed and let me know if I can assist you in the future.   These are the goals we discussed: Recommend checking hemoglobin and B12 at next visit Continue to work out 3-4 days per week and stay active.   Increase non-starchy vegetables - carrots, green bean, squash, zucchini, tomatoes, onions, peppers, spinach and other green leafy vegetables, cabbage, lettuce, cucumbers, asparagus, okra (not fried), eggplant Limit sugar and processed foods (cakes, cookies, ice cream, crackers and chips) Increase fresh fruit but limit serving sizes 1/2 cup or about the size of tennis or baseball Limit red meat to no more than 1-2 times per week (serving size about the size of your palm) Choose whole grains / lean proteins - whole wheat bread, quinoa, whole grain rice (1/2 cup), fish, chicken, [place] Avoid sugar and calorie containing beverages - soda, sweet tea and juice.  Choose water or unsweetened tea instead.  Look for copy of [place] (important to know where these are kept - you can also bring copy to our office to be placed in our file / electronic chart)    This is a list of the screening recommended for you and due dates:  Health Maintenance  Topic Date Due  . Tetanus Vaccine  07/05/1958 - checked cost today $45  . Shingles Vaccine  07/06/1999  - Checked cost today $245  . Pneumonia vaccines (2 of 2 - PPSV23) completed  . Flu Shot  05/04/2016

## 2016-06-14 NOTE — Progress Notes (Addendum)
Patient ID: [name] [name], male   DOB: 01-Apr-1941, 75 y.o.   MRN: [id number]    Subjective:   [name] [name] is a 75 y.o. male who presents for a subsequent Medicare Annual Wellness Visit.  [name] [name] is married.  He lives with his wife in [place], [place]. He is retired.  He enjoys spending time with his grandson and attending his baseball games.   [name] [name] only medication complaint is that he occasionally has numbness and tingling in this feet at night. This has been occurring for the last 1-2 months.     Current Medications (verified) Outpatient Encounter Prescriptions as of 06/14/2016  Medication Sig  . acetaminophen (TYLENOL) 500 MG tablet Take 1,000 mg by mouth every 6 (six) hours as needed for mild pain. For pain.  Marland Kitchen BIDIL 20-37.5 MG tablet Take 1 tablet by mouth 3  times daily  . budesonide (PULMICORT) 0.25 MG/2ML nebulizer solution Take 0.25 mg by nebulization 2 (two) times daily.   . Cholecalciferol (VITAMIN D3) 5000 UNITS CAPS Take 5,000 Units by mouth daily.   Marland Kitchen docusate sodium (COLACE) 100 MG capsule Take 100 mg by mouth 2 (two) times daily as needed for mild constipation.  . ferrous sulfate 325 (65 FE) MG tablet Take 325 mg by mouth 2 (two) times daily.  . furosemide (LASIX) 40 MG tablet Take 1 tablet by mouth  daily  . ipratropium-albuterol (DUONEB) 0.5-2.5 (3) MG/3ML SOLN Take 3 mLs by nebulization every 6 (six) hours as needed. For shortness of breath.   . levothyroxine (SYNTHROID, LEVOTHROID) 112 MCG tablet Take 1 tablet by mouth  daily before breakfast  . lovastatin (MEVACOR) 40 MG tablet Take 1 tablet by mouth at  bedtime (Patient taking differently: Take 40 mg by mouth at  bedtime)  . Multiple Vitamins-Minerals (MULTIVITAMINS THER. W/MINERALS) TABS Take 1 tablet by mouth daily.    . nitroGLYCERIN (NITROSTAT) 0.4 MG SL tablet Place 0.4 mg under the tongue every 5 (five) minutes as needed for chest pain.  . pantoprazole (PROTONIX) 40 MG tablet Take 1 tablet (40 mg total) by  mouth 2 (two) times daily.  . polyethylene glycol powder (GLYCOLAX/MIRALAX) powder Take 1 capful (17 grams) dissolved in at least 8 ounces water/juice once daily.  . potassium chloride (K-DUR,KLOR-CON) 10 MEQ tablet Take 1 tablet by mouth  daily (Patient taking differently: Take 10 meq by mouth  daily)  . PROAIR HFA 108 (90 Base) MCG/ACT inhaler Use 2 puffs every 6 hours  as needed for shortness of  breath  . sucralfate (CARAFATE) 1 g tablet Take 1 tablet (1 g total) by mouth 4 (four) times daily -  with meals and at bedtime.  Marland Kitchen warfarin (COUMADIN) 5 MG tablet Take 1 tablet by mouth  every day or as directed by anticoagulation clinic (Patient taking differently: Take 5 mg by mouth daily ecxcept tuesday & thursday take 2.5 mg)  . [DISCONTINUED] azithromycin (ZITHROMAX Z-PAK) 250 MG tablet As directed  . HYDROcodone-acetaminophen (NORCO) 5-325 MG tablet Take 1 tablet by mouth every 6 (six) hours as needed for moderate pain. (Patient not taking: Reported on 06/14/2016)   No facility-administered encounter medications on file as of 06/14/2016.     Allergies (verified) Review of patient's allergies indicates no known allergies.   History: Past Medical History:  Diagnosis Date  . Anemia   . Arthritis    "hips; back" (07/09/2014)  . Asthma   . Atrial fibrillation ([place])   . CAD (coronary artery disease)   .  Cholecystitis 11/2013  . 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   . COPD (chronic obstructive pulmonary disease) (HCC)    on home oxygen, 2 liters at night10/2015  . Esophageal reflux   . Gallstones   . Gastric antral vascular ectasia 2013  . Gout   . Hepatomegaly   . Hiatal hernia   . History of blood transfusion ~ 2012   "blood count dropped; had to get 3 units"  . Hyperlipidemia   . Hypothyroidism   . Other and unspecified coagulation defects   . Personal history of colonic polyps 05/29/2010   TUBULAR ADENOMA  . Unspecified essential hypertension    Past Surgical History:    Procedure Laterality Date  . cholecystomy tube  12/28/2014 - 01/06/2015   tube clogged with debris, removed and IR unable to place new tube.   . COLONOSCOPY  2013   [name] [name]: no polyps or evidence of active bleeding  . CORONARY ANGIOPLASTY WITH STENT PLACEMENT  ~ 2008; 07/08/2014   "1 + 3"  . CORONARY ARTERY BYPASS GRAFT  1998   CABG X4  . 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 ESOPHAGOGASTRODUODENOSCOPY N/A 12/12/2015   Procedure: ESOPHAGOGASTRODUODENOSCOPY (EGD);  Surgeon: [name] Mayer, MD;  Location: [facility];  Service: Endoscopy;  Laterality: N/A;  . GIVENS CAPSULE STUDY  2013   [name] [name]: AVM at 44 and blood at 30 minutes beyond first duodenal image but not actual lesion seen. If persistent IDA, bleeding, recommend enteroscopy with ablation   . HERNIA REPAIR    . LEFT AND RIGHT HEART CATHETERIZATION WITH CORONARY/GRAFT ANGIOGRAM N/A 07/09/2014   Right and left heart cath, bare metal stent to SVG to RCA. [name] Grooms, MD;   . UMBILICAL HERNIA REPAIR  [phone]   Family History  Problem Relation Age of Onset  . Leukemia Mother   . Kidney disease Mother     kidney removed   . Heart attack Brother   . Cancer Father   . Heart disease Father   . Stomach cancer Sister   . Stomach cancer Sister   . Early death Brother   . Hypertension      family   . Colon cancer Neg Hx    Social History   Occupational History  . retired    Social History Main Topics  . Smoking status: Former Smoker    Packs/day: 1.00    Years: 42.00    Types: Cigarettes    Quit date: 12/16/1996  . Smokeless tobacco: Never Used  . Alcohol use 0.6 oz/week    1 Cans of beer per week     Comment: 1 per week  . Drug use: No  . Sexual activity: No    Do you feel safe at home?  Yes Are there smokers in your home (other than you)? No  Dietary issues and exercise activities discussed: Current Exercise  Habits: Structured exercise class, Type of exercise: strength training/weights;Other - see comments (reclining bike), Time (Minutes): 30, Frequency (Times/Week): 4, Weekly Exercise (Minutes/Week): 120, Intensity: Moderate  Current Dietary habits:  Avoids red meat since his gall bladder was removed.   He finds he cannot tolerate red meat.   Also tries to limit high salt foods due to elevated BP Cardiac Risk Factors include: advanced age (>60men, >72 women);dyslipidemia;family history of premature cardiovascular disease;hypertension;male gender  Objective:    Today's Vitals   06/14/16 2353  06/14/16 0952  BP: (!) 142/62 140/62  Pulse: 68   Weight: 186 lb 8 oz (84.6 kg)   Height: 5\' 10"  (1.778 m)   PainSc: 0-No pain    Body mass index is 26.76 kg/m.   INR was 1.8 today   Activities of Daily Living In your present state of health, do you have any difficulty performing the following activities: 06/14/2016 03/22/2016  Hearing? N N  Vision? N N  Difficulty concentrating or making decisions? N N  Walking or climbing stairs? Y Y  Dressing or bathing? N N  Doing errands, shopping? N Y  Conservation officer, nature and eating ? N -  Using the Toilet? N -  In the past six months, have you accidently leaked urine? N -  Do you have problems with loss of bowel control? N -  Managing your Medications? N -  Managing your Finances? N -  Housekeeping or managing your Housekeeping? N -  Some recent data might be hidden     Depression Screen PHQ 2/9 Scores 06/14/2016 04/08/2016 01/07/2016 05/29/2015  PHQ - 2 Score 1 0 0 0  PHQ- 9 Score - - - -     Fall Risk Fall Risk  06/14/2016 04/08/2016 01/07/2016 05/29/2015 12/12/2014  Falls in the past year? No No No No No    Cognitive Function: MMSE - Mini Mental State Exam 06/14/2016 05/29/2015  Orientation to time 5 5  Orientation to Place 5 5  Registration 3 3  Attention/ Calculation 5 5  Recall 3 3  Language- name 2 objects 2 2  Language- repeat 1 1  Language-  follow 3 step command 3 3  Language- read & follow direction 1 1  Write a sentence 1 1  Copy design 1 1  Total score 30 30    Immunizations and Health Maintenance Immunization History  Administered Date(s) Administered  . Influenza Whole 11/18/2010  . Influenza,inj,Quad PF,36+ Mos 07/10/2014, 07/09/2015  . Pneumococcal Conjugate-13 04/26/2014   Health Maintenance Due  Topic Date Due  . TETANUS/TDAP  07/05/1958  . ZOSTAVAX  07/06/1999  . PNA vac Low Risk Adult (2 of 2 - PPSV23) 04/27/2015  . INFLUENZA VACCINE  05/04/2016    Patient Care Team: Claretta Fraise, MD as PCP - General (Family Medicine) Jerene Bears, MD as Consulting Physician (Gastroenterology) Belva Crome, MD as Consulting Physician (Cardiology)  Indicate any recent Medical Services you may have received from other than [facility] providers in the past year (date may be approximate).    Assessment:    Annual Wellness Visit  Therapeutic Anticoagulation - patients goal is 1.8 to 2.0 per cardiologist - he has history of GI bleeding.  Numbness in feet. Overweight - patient has lost about 12# since last AWV   Screening Tests Health Maintenance  Topic Date Due  . TETANUS/TDAP  07/05/1958  . ZOSTAVAX  07/06/1999  . PNA vac Low Risk Adult (2 of 2 - PPSV23) 04/27/2015  . INFLUENZA VACCINE  05/04/2016        Plan:   During the course of the visit Froylan was educated and counseled about the following appropriate screening and preventive services:   Vaccines to include Pneumoccal, Influenza,  Td, Zostavax - checked on cost of Boostrix ($45) and Zostavax ($249) - patient declined both due to cost.  He is UTD on Pneumonia vaccine and will get influenza vaccine at appt with Dr Livia Snellen in October.  Colorectal cancer screening - UTD  Cardiovascular disease screening - UTD  Diabetes screening - UTD, last FBG was 88  Glaucoma screening / Eye Exam - UTd  Nutrition counseling - continue to limit red meat.  Recommended  increase in fruit and vegetable intake   Advanced Directives - copy requested  Physical Activity - continue to work out at [employer] 3-4 days per week and golf 1 day per week.   Recommend patient follow up with PCP about numbness - consider checking vitamin D, B12 level and CBC and could be related to deficiencies. Appt made with PCP     Patient Instructions (the written plan) were given to the patient.   Cherre Robins, PharmD   06/14/2016    I have reviewed and agree with the above AWV documentation.  Claretta Fraise, M.D.

## 2016-06-25 ENCOUNTER — Other Ambulatory Visit: Payer: Self-pay | Admitting: Interventional Cardiology

## 2016-06-28 DIAGNOSIS — J449 Chronic obstructive pulmonary disease, unspecified: Secondary | ICD-10-CM | POA: Diagnosis not present

## 2016-06-30 ENCOUNTER — Other Ambulatory Visit: Payer: Self-pay | Admitting: Interventional Cardiology

## 2016-06-30 ENCOUNTER — Other Ambulatory Visit: Payer: Self-pay | Admitting: Family Medicine

## 2016-06-30 NOTE — Telephone Encounter (Signed)
amLODipine (NORVASC) 10 MG tablet [[id number]] DISCONTINUED  Order Details  Dose: 10 mg Route: Oral Frequency: Daily  Dispense Quantity:  90 tablet Refills:  2 Fills remaining:  --        Sig: Take 1 tablet (10 mg total) by mouth daily.       Discontinue Date:  03/25/2016 1337 Discontinue User:  Charlynne Cousins, MD Discontinue Reason:  Stop Taking at Discharge  Written Date:  10/28/15 Expiration Date:  10/27/16    Start Date:  10/28/15 End Date:  03/25/16         Ordering Provider:  Belva Crome, MD DEA #:  [id number] NPI:  [id number]   Authorizing Provider:  Belva Crome, MD DEA #:  [id number] NPI:  [id number]

## 2016-07-05 DIAGNOSIS — J449 Chronic obstructive pulmonary disease, unspecified: Secondary | ICD-10-CM | POA: Diagnosis not present

## 2016-07-06 ENCOUNTER — Encounter: Payer: Self-pay | Admitting: *Deleted

## 2016-07-06 ENCOUNTER — Encounter: Payer: Self-pay | Admitting: Interventional Cardiology

## 2016-07-06 ENCOUNTER — Ambulatory Visit (INDEPENDENT_AMBULATORY_CARE_PROVIDER_SITE_OTHER): Payer: Medicare Other | Admitting: Interventional Cardiology

## 2016-07-06 ENCOUNTER — Other Ambulatory Visit: Payer: Self-pay | Admitting: Interventional Cardiology

## 2016-07-06 VITALS — BP 132/52 | HR 56 | Ht 70.0 in | Wt 183.0 lb

## 2016-07-06 DIAGNOSIS — N183 Chronic kidney disease, stage 3 unspecified: Secondary | ICD-10-CM

## 2016-07-06 DIAGNOSIS — E7800 Pure hypercholesterolemia, unspecified: Secondary | ICD-10-CM

## 2016-07-06 DIAGNOSIS — I10 Essential (primary) hypertension: Secondary | ICD-10-CM | POA: Diagnosis not present

## 2016-07-06 DIAGNOSIS — I5022 Chronic systolic (congestive) heart failure: Secondary | ICD-10-CM | POA: Diagnosis not present

## 2016-07-06 DIAGNOSIS — I482 Chronic atrial fibrillation, unspecified: Secondary | ICD-10-CM

## 2016-07-06 DIAGNOSIS — I2510 Atherosclerotic heart disease of native coronary artery without angina pectoris: Secondary | ICD-10-CM | POA: Diagnosis not present

## 2016-07-06 DIAGNOSIS — Z7901 Long term (current) use of anticoagulants: Secondary | ICD-10-CM

## 2016-07-06 DIAGNOSIS — I5020 Unspecified systolic (congestive) heart failure: Secondary | ICD-10-CM

## 2016-07-06 NOTE — Patient Instructions (Signed)
Medication Instructions:  Your physician recommends that you continue on your current medications as directed. Please refer to the Current Medication list given to you today.  See catheterization instruction sheet    Labwork: Your physician recommends that you return for lab work the morning of October 11,2017. The lab opens at 7:30 AM   Testing/Procedures: Your physician has requested that you have a cardiac catheterization. Cardiac catheterization is used to diagnose and/or treat various heart conditions. Doctors may recommend this procedure for a number of different reasons. The most common reason is to evaluate chest pain. Chest pain can be a symptom of coronary artery disease (CAD), and cardiac catheterization can show whether plaque is narrowing or blocking your heart's arteries. This procedure is also used to evaluate the valves, as well as measure the blood flow and oxygen levels in different parts of your heart. For further information please visit [email or web address]. Please follow instruction sheet, as given. Scheduled for October 12,2017    Follow-Up: To be arranged after catheterization.   Any Other Special Instructions Will Be Listed Below (If Applicable).     If you need a refill on your cardiac medications before your next appointment, please call your pharmacy.

## 2016-07-06 NOTE — Progress Notes (Signed)
Cardiology Office Note    Date:  07/06/2016   ID:  [name], [name] 07/25/1941, MRN [id number]  PCP:  Claretta Fraise, MD  Cardiologist: Sinclair Grooms, MD   Chief Complaint  Patient presents with  . Congestive Heart Failure    History of Present Illness:  [name] [name] is a 75 y.o. male combined systolic and diastolic heart failure with recent wall motion studies demonstrating EF 25-30%, COPD, CABG with known history of graft failure with bare-metal stent in the SVG to the RCA, recurrent slow GI bleeding with significant anemia, essential hypertension, and chronic atrial fibrillation.  Two relatively recent hospital stays in March and June demonstrated decreased LV function. On one occasion he was hospitalized with anemia the other occasion, pneumonia. Since the last hospitalization, he has experienced exertional fatigue and dyspnea. He denies chest pain. He does have mild orthopnea. He has not had syncope. He denies palpitations. He denies anginal quality chest pain.  In 2015 a bare-metal stent was placed in the saphenous vein graft to the RCA.  Past Medical History:  Diagnosis Date  . Anemia   . Arthritis    "hips; back" (07/09/2014)  . Asthma   . Atrial fibrillation ([place])   . CAD (coronary artery disease)   . Cholecystitis 11/2013  . 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   . COPD (chronic obstructive pulmonary disease) ([facility])    on home oxygen, 2 liters at night10/2015  . Esophageal reflux   . Gallstones   . Gastric antral vascular ectasia 2013  . Gout   . Hepatomegaly   . Hiatal hernia   . History of blood transfusion ~ 2012   "blood count dropped; had to get 3 units"  . Hyperlipidemia   . Hypothyroidism   . Other and unspecified coagulation defects   . Personal history of colonic polyps 05/29/2010   TUBULAR ADENOMA  . Unspecified essential hypertension     Past Surgical History:  Procedure Laterality Date  . cholecystomy tube  12/28/2014 -  01/06/2015   tube clogged with debris, removed and IR unable to place new tube.   . COLONOSCOPY  2013   Dr. Sharlett Iles: no polyps or evidence of active bleeding  . CORONARY ANGIOPLASTY WITH STENT PLACEMENT  ~ 2008; 07/08/2014   "1 + 3"  . CORONARY ARTERY BYPASS GRAFT  1998   CABG X4  . ESOPHAGOGASTRODUODENOSCOPY  2013   Dr. 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 ESOPHAGOGASTRODUODENOSCOPY N/A 12/12/2015   Procedure: ESOPHAGOGASTRODUODENOSCOPY (EGD);  Surgeon: Gatha Mayer, MD;  Location: [facility];  Service: Endoscopy;  Laterality: N/A;  . GIVENS CAPSULE STUDY  2013   Dr. Sharlett Iles: AVM at 69 and blood at 30 minutes beyond first duodenal image but not actual lesion seen. If persistent IDA, bleeding, recommend enteroscopy with ablation   . HERNIA REPAIR    . LEFT AND RIGHT HEART CATHETERIZATION WITH CORONARY/GRAFT ANGIOGRAM N/A 07/09/2014   Right and left heart cath, bare metal stent to SVG to RCA. Sinclair Grooms, MD;   . UMBILICAL HERNIA REPAIR  [phone]    Current Medications: Outpatient Medications Prior to Visit  Medication Sig Dispense Refill  . acetaminophen (TYLENOL) 500 MG tablet Take 1,000 mg by mouth every 6 (six) hours as needed for mild pain. For pain.    Marland Kitchen BIDIL 20-37.5 MG tablet Take 1 tablet by mouth 3  times daily 270 tablet 0  . budesonide (  PULMICORT) 0.25 MG/2ML nebulizer solution Take 0.25 mg by nebulization 2 (two) times daily.     . Cholecalciferol (VITAMIN D3) 5000 UNITS CAPS Take 5,000 Units by mouth daily.     Marland Kitchen docusate sodium (COLACE) 100 MG capsule Take 100 mg by mouth 2 (two) times daily as needed for mild constipation.    . ferrous sulfate 325 (65 FE) MG tablet Take 325 mg by mouth 2 (two) times daily.    . furosemide (LASIX) 40 MG tablet Take 1 tablet by mouth  daily 90 tablet 1  . HYDROcodone-acetaminophen (NORCO) 5-325 MG tablet Take 1 tablet by mouth every 6 (six) hours as needed for moderate pain. (Patient  not taking: Reported on 06/14/2016) 30 tablet 0  . ipratropium-albuterol (DUONEB) 0.5-2.5 (3) MG/3ML SOLN Take 3 mLs by nebulization every 6 (six) hours as needed. For shortness of breath.     . levothyroxine (SYNTHROID, LEVOTHROID) 112 MCG tablet Take 1 tablet by mouth  daily before breakfast 90 tablet 0  . lovastatin (MEVACOR) 40 MG tablet Take 1 tablet by mouth at  bedtime (Patient taking differently: Take 40 mg by mouth at  bedtime) 90 tablet 0  . Multiple Vitamins-Minerals (MULTIVITAMINS THER. W/MINERALS) TABS Take 1 tablet by mouth daily.      . nitroGLYCERIN (NITROSTAT) 0.4 MG SL tablet Place 0.4 mg under the tongue every 5 (five) minutes as needed for chest pain.    . pantoprazole (PROTONIX) 40 MG tablet Take 1 tablet (40 mg total) by mouth 2 (two) times daily. 180 tablet 3  . polyethylene glycol powder (GLYCOLAX/MIRALAX) powder Take 1 capful (17 grams) dissolved in at least 8 ounces water/juice once daily. 527 g 4  . potassium chloride (K-DUR,KLOR-CON) 10 MEQ tablet Take 1 tablet by mouth  daily (Patient taking differently: Take 10 meq by mouth  daily) 90 tablet 1  . PROAIR HFA 108 (90 Base) MCG/ACT inhaler Use 2 puffs every 6 hours  as needed for shortness of  breath 34 g 3  . sucralfate (CARAFATE) 1 g tablet Take 1 tablet (1 g total) by mouth 4 (four) times daily -  with meals and at bedtime. 360 tablet 5  . warfarin (COUMADIN) 5 MG tablet TAKE 1 TABLET BY MOUTH  EVERY DAY OR AS DIRECTED BY ANTICOAGULATION CLINIC 90 tablet 1   No facility-administered medications prior to visit.      Allergies:   Review of patient's allergies indicates no known allergies.   Social History   Social History  . Marital status: Married    Spouse name: [name]  . Number of children: 2  . Years of education: N/A   Occupational History  . retired    Social History Main Topics  . Smoking status: Former Smoker    Packs/day: 1.00    Years: 42.00    Types: Cigarettes    Quit date: 12/16/1996  .  Smokeless tobacco: Never Used  . Alcohol use 0.6 oz/week    1 Cans of beer per week     Comment: 1 per week  . Drug use: No  . Sexual activity: No   Other Topics Concern  . None   Social History Narrative  . None     Family History:  The patient's family history includes Cancer in his father; Early death in his brother; Heart attack in his brother; Heart disease in his father; Kidney disease in his mother; Leukemia in his mother; Stomach cancer in his sister and sister.   ROS:  Please see the history of present illness.    PNA, , easy bruising, no history of stroke, and no angina.  All other systems reviewed and are negative.   PHYSICAL EXAM:   VS:  BP (!) 132/52   Pulse (!) 56   Ht 5\' 10"  (1.778 m)   Wt 183 lb (83 kg)   BMI 26.26 kg/m    GEN: Well nourished, well developed, in no acute distress  HEENT: normal  Neck: no JVD, carotid bruits, or masses Cardiac: IIRR; no murmurs, rubs, or gallops,no edema  Respiratory:  clear to auscultation bilaterally, normal work of breathing GI: soft, nontender, nondistended, + BS MS: no deformity or atrophy  Skin: warm and dry, no rash Neuro:  Alert and Oriented x 3, Strength and sensation are intact Psych: euthymic mood, full affect  Wt Readings from Last 3 Encounters:  07/06/16 183 lb (83 kg)  06/14/16 186 lb 8 oz (84.6 kg)  04/22/16 187 lb (84.8 kg)      Studies/Labs Reviewed:   EKG:  EKG  Last EKG in June revealed right bundle branch block, atrial fibrillation with relatively slow ventricular response.  Recent Labs: 12/08/2015: Magnesium 2.3 03/22/2016: B Natriuretic Peptide 520.5 04/08/2016: ALT 9; BUN 20; Creatinine, Ser 1.46; Potassium 4.5; Sodium 144 05/05/2016: Hemoglobin 9.4; Platelets 564.0   Lipid Panel    Component Value Date/Time   CHOL 134 09/24/2015 0824   TRIG 101 09/24/2015 0824   HDL 44 09/24/2015 0824   CHOLHDL 3.0 09/24/2015 0824   CHOLHDL 4 07/05/2014 0937   VLDL 29.4 07/05/2014 0937   LDLCALC 70  09/24/2015 0824    Additional studies/ records that were reviewed today include:  Echocardiogram 2017 Study Conclusions  - Left ventricle: The cavity size was normal. There was mild to   moderate concentric hypertrophy. Systolic function was severely   reduced. The estimated ejection fraction was in the range of 25%   to 30%. Global hypokinesis of worse in the anterolateral   myocardium. The study is not technically sufficient to allow   evaluation of LV diastolic function. - Aortic valve: Transvalvular velocity was within the normal range.   There was no stenosis. There was no regurgitation. - Mitral valve: Calcified annulus. There was mild regurgitation. - Left atrium: The atrium was severely dilated. - Right ventricle: The cavity size was mildly dilated. Wall   thickness was normal. Systolic function was reduced. - Right atrium: The atrium was severely dilated. - Tricuspid valve: There was trivial regurgitation.   ASSESSMENT:    1. Atherosclerosis of native coronary artery of native heart without angina pectoris   2. Essential hypertension   3. Chronic systolic CHF (congestive heart failure) ([place])   4. Atrial fibrillation, chronic ([place])   5. CKD (chronic kidney disease), stage III   6. Anticoagulant long-term use   7. Pure hypercholesterolemia      PLAN:  In order of problems listed above:  1. Known graft failure. No recent angina. Known bare-metal stent and SVG to RCA. New LV dysfunction.We need to exclude graft failure as a cause of LV dysfunction. 2. Well controlled. 3. No evidence of volume overload. EF now less than 35%. Rule out bypass graft failure. Needs left and right heart cath with coronary angiography to help better define etiology of decreased LV function. 4. We need to be careful with contrast exposure given kidney dysfunction. Bmet, INR, and hemoglobin will be done the day prior to angiography. 5. Will need to limit contrast administration. We'll  probably  not performed LV gram. 6. Will need to hold Coumadin 3 days prior to the procedure. INR along with other labs will be checked the day prior to heart cath. Plan to use the radial and brachial approach.I have decided against bridging. He has GI bleeding issues.  The patient was counseled to undergo left and right heart catheterization, coronary angiography, and possible percutaneous coronary intervention with stent implantation. The procedural risks and benefits were discussed in detail. The risks discussed included death, stroke, myocardial infarction, life-threatening bleeding, limb ischemia, kidney injury, allergy, and possible emergency cardiac surgery. The risk of these significant complications were estimated to occur less than 1% of the time. After discussion, the patient has agreed to proceed.    Medication Adjustments/Labs and Tests Ordered: Current medicines are reviewed at length with the patient today.  Concerns regarding medicines are outlined above.  Medication changes, Labs and Tests ordered today are listed in the Patient Instructions below. Patient Instructions  Medication Instructions:  Your physician recommends that you continue on your current medications as directed. Please refer to the Current Medication list given to you today.  See catheterization instruction sheet    Labwork: Your physician recommends that you return for lab work the morning of October 11,2017. The lab opens at 7:30 AM   Testing/Procedures: Your physician has requested that you have a cardiac catheterization. Cardiac catheterization is used to diagnose and/or treat various heart conditions. Doctors may recommend this procedure for a number of different reasons. The most common reason is to evaluate chest pain. Chest pain can be a symptom of coronary artery disease (CAD), and cardiac catheterization can show whether plaque is narrowing or blocking your heart's arteries. This procedure is also used to evaluate  the valves, as well as measure the blood flow and oxygen levels in different parts of your heart. For further information please visit [email or web address]. Please follow instruction sheet, as given. Scheduled for October 12,2017    Follow-Up: To be arranged after catheterization.   Any Other Special Instructions Will Be Listed Below (If Applicable).     If you need a refill on your cardiac medications before your next appointment, please call your pharmacy.      Signed, Sinclair Grooms, MD  07/06/2016 1:40 PM    [facility] [address], [place], [place]  [address] Phone: [phone]; Fax: [phone]

## 2016-07-07 ENCOUNTER — Telehealth: Payer: Self-pay | Admitting: Interventional Cardiology

## 2016-07-07 NOTE — Telephone Encounter (Signed)
Resume amlodipine and the prior dose, 10 mg daily

## 2016-07-07 NOTE — Telephone Encounter (Signed)
Spoke with pt and he states that he has been taking Amlodipine 10mg  QD since his d/c from the hospital.  He states that he did not realized that they had wanted him to stop it.  Pt states that his BP has been running fairly well at home.  States systolic is usually normal, occasionally gets up to 440 and diastolic is usually 34-74, occasionally drops into 40's.  Advised pt that I will need to send message to Dr. Tamala Julian to review and advise on whether he would like for pt to continue Amlodipine.  Pt verbalized understanding and was in agreement with this plan.

## 2016-07-07 NOTE — Telephone Encounter (Signed)
Left message to call back  

## 2016-07-07 NOTE — Telephone Encounter (Signed)
°*  STAT* If patient is at the pharmacy, call can be transferred to refill team.   1. Which medications need to be refilled? (please list name of each medication and dose if known) Amlodipine 10mg    2. Which pharmacy/location (including street and city if local pharmacy) is medication to be sent to? [facility]   3. Do they need a 30 day or 90 day supply?[place]

## 2016-07-07 NOTE — Telephone Encounter (Signed)
Patient requesting a refill on amlodipine. Looks like this medication was discontinued at his 03/25/16 hospital discharge. Please advise. Thanks, MI

## 2016-07-08 IMAGING — CR DG CHEST 2V
2 series · 2 of 2 positions shown · non-contrast
Comparison: 12/24/2015 and earlier.

CLINICAL DATA: 74-year-old male with left lung bronchopneumonia in
[REDACTED]. Subsequent encounter.

EXAM:
CHEST  2 VIEW

[view not recorded (1 of 2)]
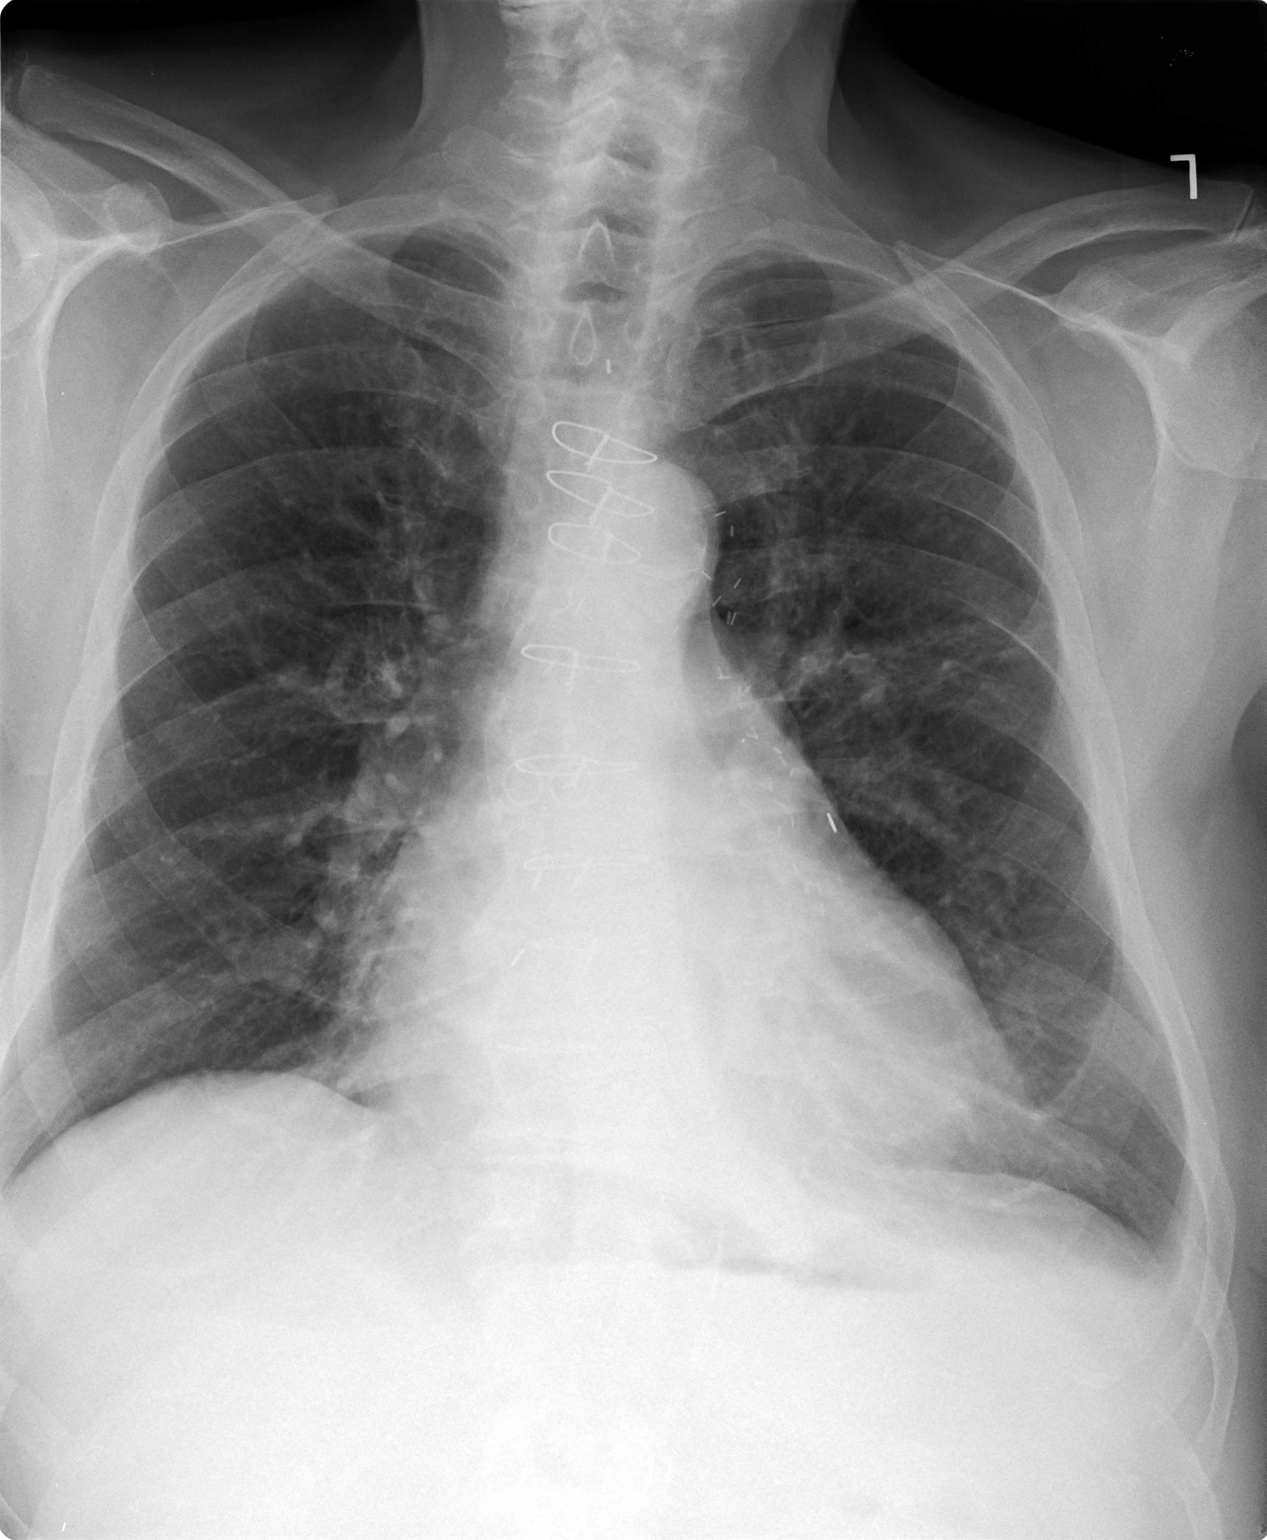

[view not recorded (2 of 2)]
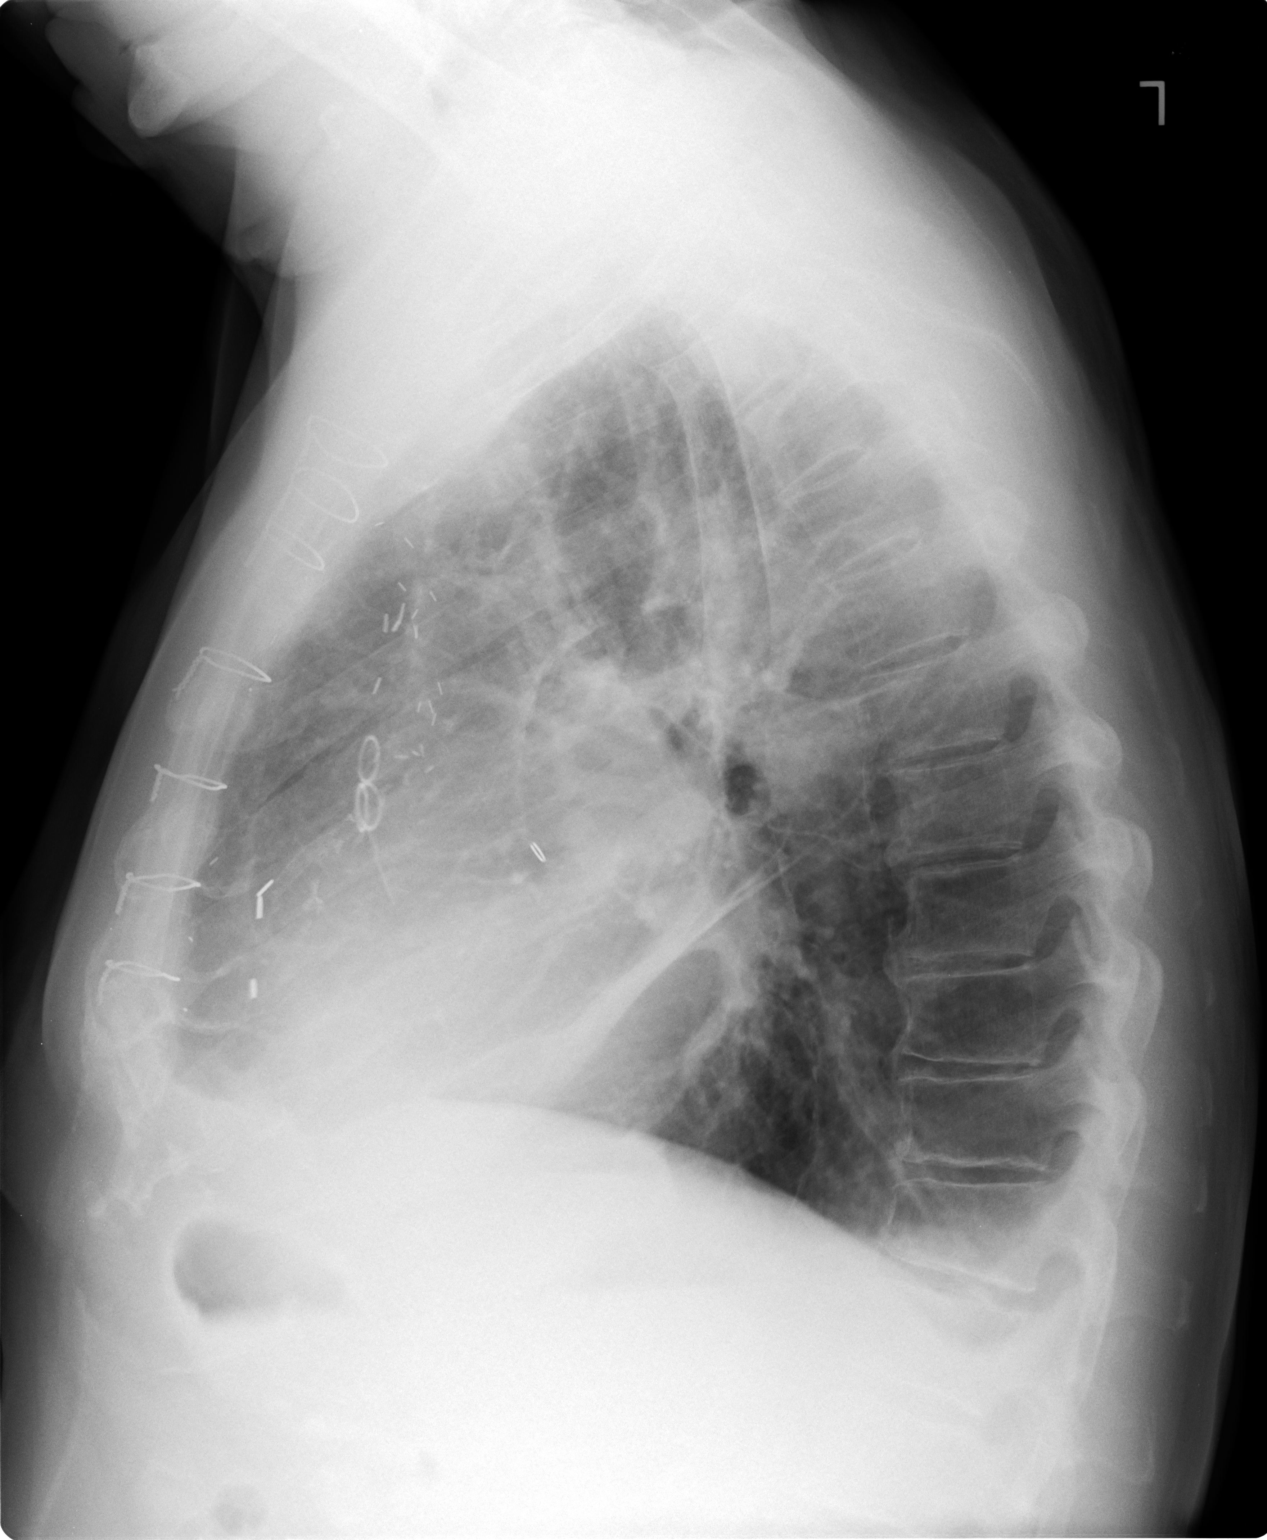

[2 of 2 positions shown; findings below may reference images not displayed]

FINDINGS: Further regressed but not completely resolved curvilinear left mid
lung opacity radiating from the hilum (arrow). Small volume left
pleural effusion also persists, with suggestion of a small volume of
fissure oral fluid. Confluent anterior basal segment left lower lobe
opacity has resolved. No areas of worsening ventilation. Stable
cardiac size and mediastinal contours. Sequelae of CABG. Extensive
calcified aortic atherosclerosis. No acute osseous abnormality
identified.
IMPRESSION: 1. Further regressed but not completely resolved left perihilar
opacity. This might reflect postinflammatory scarring.
2. Persistent small left pleural effusion.
3. No areas of worsening ventilation or new cardiopulmonary
abnormality.

## 2016-07-08 MED ORDER — AMLODIPINE BESYLATE 10 MG PO TABS: 10.0000 mg | ORAL_TABLET | Freq: Every day | ORAL | 3 refills | Status: DC

## 2016-07-08 NOTE — Telephone Encounter (Signed)
Spoke with pt's wife, [name].  DPR on file. Advised her Dr. Tamala Julian said for pt to resume Amlodipine 10mg  QD.   Verified pharmacy and sent in prescription. Wife verbalized understanding and was in agreement with this plan.

## 2016-07-14 ENCOUNTER — Other Ambulatory Visit: Payer: Medicare Other | Admitting: *Deleted

## 2016-07-14 ENCOUNTER — Ambulatory Visit: Payer: Self-pay | Admitting: Family Medicine

## 2016-07-14 DIAGNOSIS — I5022 Chronic systolic (congestive) heart failure: Secondary | ICD-10-CM

## 2016-07-14 DIAGNOSIS — I2510 Atherosclerotic heart disease of native coronary artery without angina pectoris: Secondary | ICD-10-CM

## 2016-07-14 LAB — CBC WITH DIFFERENTIAL/PLATELET
BASOS PCT: 1 %
Basophils Absolute: 99 cells/uL (ref 0–200)
EOS ABS: 693 {cells}/uL — AB (ref 15–500)
Eosinophils Relative: 7 %
HCT: 32.5 % — ABNORMAL LOW (ref 38.5–50.0)
Hemoglobin: 10.2 g/dL — ABNORMAL LOW (ref 13.2–17.1)
LYMPHS PCT: 11 %
Lymphs Abs: 1089 cells/uL (ref 850–3900)
MCH: 28.7 pg (ref 27.0–33.0)
MCHC: 31.4 g/dL — ABNORMAL LOW (ref 32.0–36.0)
MCV: 91.3 fL (ref 80.0–100.0)
MONOS PCT: 12 %
MPV: 9.9 fL (ref 7.5–12.5)
Monocytes Absolute: 1188 cells/uL — ABNORMAL HIGH (ref 200–950)
Neutro Abs: 6831 cells/uL (ref 1500–7800)
Neutrophils Relative %: 69 %
PLATELETS: 336 10*3/uL (ref 140–400)
RBC: 3.56 MIL/uL — ABNORMAL LOW (ref 4.20–5.80)
RDW: 16.6 % — AB (ref 11.0–15.0)
WBC: 9.9 10*3/uL (ref 3.8–10.8)

## 2016-07-14 LAB — BASIC METABOLIC PANEL
BUN: 21 mg/dL (ref 7–25)
CHLORIDE: 105 mmol/L (ref 98–110)
CO2: 26 mmol/L (ref 20–31)
CREATININE: 1.31 mg/dL — AB (ref 0.70–1.18)
Calcium: 9.4 mg/dL (ref 8.6–10.3)
Glucose, Bld: 91 mg/dL (ref 65–99)
Potassium: 4.1 mmol/L (ref 3.5–5.3)
Sodium: 141 mmol/L (ref 135–146)

## 2016-07-14 LAB — PROTIME-INR
INR: 1.5 — ABNORMAL HIGH
Prothrombin Time: 16.1 s — ABNORMAL HIGH (ref 9.0–11.5)

## 2016-07-15 ENCOUNTER — Encounter (HOSPITAL_COMMUNITY)
Admission: RE | Disposition: A | Discharge status: Home / Self Care | Payer: Self-pay | Source: Ambulatory Visit | Attending: Interventional Cardiology

## 2016-07-15 ENCOUNTER — Ambulatory Visit (HOSPITAL_COMMUNITY)
Admission: RE | Admit: 2016-07-15 | Discharge: 2016-07-15 | Disposition: A | Discharge status: Home / Self Care | Payer: Medicare Other | Source: Ambulatory Visit | Attending: Interventional Cardiology | Admitting: Interventional Cardiology

## 2016-07-15 DIAGNOSIS — I2582 Chronic total occlusion of coronary artery: Secondary | ICD-10-CM | POA: Diagnosis not present

## 2016-07-15 DIAGNOSIS — E7800 Pure hypercholesterolemia, unspecified: Secondary | ICD-10-CM | POA: Insufficient documentation

## 2016-07-15 DIAGNOSIS — I5022 Chronic systolic (congestive) heart failure: Secondary | ICD-10-CM | POA: Diagnosis present

## 2016-07-15 DIAGNOSIS — D631 Anemia in chronic kidney disease: Secondary | ICD-10-CM | POA: Insufficient documentation

## 2016-07-15 DIAGNOSIS — I272 Pulmonary hypertension, unspecified: Secondary | ICD-10-CM | POA: Insufficient documentation

## 2016-07-15 DIAGNOSIS — Z955 Presence of coronary angioplasty implant and graft: Secondary | ICD-10-CM | POA: Diagnosis not present

## 2016-07-15 DIAGNOSIS — Z86010 Personal history of colonic polyps: Secondary | ICD-10-CM | POA: Insufficient documentation

## 2016-07-15 DIAGNOSIS — E039 Hypothyroidism, unspecified: Secondary | ICD-10-CM | POA: Insufficient documentation

## 2016-07-15 DIAGNOSIS — I482 Chronic atrial fibrillation: Secondary | ICD-10-CM | POA: Diagnosis not present

## 2016-07-15 DIAGNOSIS — J449 Chronic obstructive pulmonary disease, unspecified: Secondary | ICD-10-CM | POA: Insufficient documentation

## 2016-07-15 DIAGNOSIS — Z7951 Long term (current) use of inhaled steroids: Secondary | ICD-10-CM | POA: Diagnosis not present

## 2016-07-15 DIAGNOSIS — K219 Gastro-esophageal reflux disease without esophagitis: Secondary | ICD-10-CM | POA: Diagnosis not present

## 2016-07-15 DIAGNOSIS — M1990 Unspecified osteoarthritis, unspecified site: Secondary | ICD-10-CM | POA: Diagnosis not present

## 2016-07-15 DIAGNOSIS — I25718 Atherosclerosis of autologous vein coronary artery bypass graft(s) with other forms of angina pectoris: Secondary | ICD-10-CM | POA: Insufficient documentation

## 2016-07-15 DIAGNOSIS — I5020 Unspecified systolic (congestive) heart failure: Secondary | ICD-10-CM

## 2016-07-15 DIAGNOSIS — Z9981 Dependence on supplemental oxygen: Secondary | ICD-10-CM | POA: Diagnosis not present

## 2016-07-15 DIAGNOSIS — I340 Nonrheumatic mitral (valve) insufficiency: Secondary | ICD-10-CM | POA: Insufficient documentation

## 2016-07-15 DIAGNOSIS — M109 Gout, unspecified: Secondary | ICD-10-CM | POA: Insufficient documentation

## 2016-07-15 DIAGNOSIS — I4510 Unspecified right bundle-branch block: Secondary | ICD-10-CM | POA: Diagnosis not present

## 2016-07-15 DIAGNOSIS — K922 Gastrointestinal hemorrhage, unspecified: Secondary | ICD-10-CM | POA: Diagnosis not present

## 2016-07-15 DIAGNOSIS — I130 Hypertensive heart and chronic kidney disease with heart failure and stage 1 through stage 4 chronic kidney disease, or unspecified chronic kidney disease: Secondary | ICD-10-CM | POA: Insufficient documentation

## 2016-07-15 DIAGNOSIS — I2510 Atherosclerotic heart disease of native coronary artery without angina pectoris: Secondary | ICD-10-CM

## 2016-07-15 DIAGNOSIS — N183 Chronic kidney disease, stage 3 (moderate): Secondary | ICD-10-CM | POA: Insufficient documentation

## 2016-07-15 DIAGNOSIS — Z7901 Long term (current) use of anticoagulants: Secondary | ICD-10-CM | POA: Insufficient documentation

## 2016-07-15 DIAGNOSIS — Z8249 Family history of ischemic heart disease and other diseases of the circulatory system: Secondary | ICD-10-CM | POA: Insufficient documentation

## 2016-07-15 DIAGNOSIS — Z87891 Personal history of nicotine dependence: Secondary | ICD-10-CM | POA: Insufficient documentation

## 2016-07-15 DIAGNOSIS — E785 Hyperlipidemia, unspecified: Secondary | ICD-10-CM | POA: Diagnosis present

## 2016-07-15 DIAGNOSIS — I5042 Chronic combined systolic (congestive) and diastolic (congestive) heart failure: Secondary | ICD-10-CM | POA: Insufficient documentation

## 2016-07-15 DIAGNOSIS — K449 Diaphragmatic hernia without obstruction or gangrene: Secondary | ICD-10-CM | POA: Diagnosis not present

## 2016-07-15 DIAGNOSIS — I25708 Atherosclerosis of coronary artery bypass graft(s), unspecified, with other forms of angina pectoris: Secondary | ICD-10-CM | POA: Diagnosis present

## 2016-07-15 DIAGNOSIS — Z800 Family history of malignant neoplasm of digestive organs: Secondary | ICD-10-CM | POA: Diagnosis not present

## 2016-07-15 HISTORY — PX: CARDIAC CATHETERIZATION: SHX172

## 2016-07-15 LAB — POCT I-STAT 3, VENOUS BLOOD GAS (G3P V)
Acid-Base Excess: 2 mmol/L (ref 0.0–2.0)
BICARBONATE: 27.5 mmol/L (ref 20.0–28.0)
O2 Saturation: 70 %
TCO2: 29 mmol/L (ref 0–100)
pCO2, Ven: 47.1 mmHg (ref 44.0–60.0)
pH, Ven: 7.374 (ref 7.250–7.430)
pO2, Ven: 38 mmHg (ref 32.0–45.0)

## 2016-07-15 LAB — POCT I-STAT 3, ART BLOOD GAS (G3+)
Acid-Base Excess: 1 mmol/L (ref 0.0–2.0)
Bicarbonate: 26.3 mmol/L (ref 20.0–28.0)
O2 SAT: 95 %
TCO2: 28 mmol/L (ref 0–100)
pCO2 arterial: 46 mmHg (ref 32.0–48.0)
pH, Arterial: 7.365 (ref 7.350–7.450)
pO2, Arterial: 80 mmHg — ABNORMAL LOW (ref 83.0–108.0)

## 2016-07-15 LAB — CARDIAC CATHETERIZATION: Cath EF Quantitative: 30 %

## 2016-07-15 SURGERY — RIGHT/LEFT HEART CATH AND CORONARY/GRAFT ANGIOGRAPHY

## 2016-07-15 MED ORDER — SODIUM CHLORIDE 0.9% FLUSH: 3.0000 mL | Freq: Two times a day (BID) | INTRAVENOUS | Status: DC

## 2016-07-15 MED ORDER — LIDOCAINE HCL (PF) 1 % IJ SOLN
INTRAMUSCULAR | Status: AC
  Filled 2016-07-15: qty 30

## 2016-07-15 MED ORDER — MIDAZOLAM HCL 2 MG/2ML IJ SOLN
INTRAMUSCULAR | Status: AC
  Filled 2016-07-15: qty 2

## 2016-07-15 MED ORDER — HEPARIN SODIUM (PORCINE) 1000 UNIT/ML IJ SOLN
INTRAMUSCULAR | Status: DC | PRN
  Administered 2016-07-15: 4000 [IU] via INTRAVENOUS

## 2016-07-15 MED ORDER — OXYCODONE-ACETAMINOPHEN 5-325 MG PO TABS: 1.0000 | ORAL_TABLET | ORAL | Status: DC | PRN

## 2016-07-15 MED ORDER — FENTANYL CITRATE (PF) 100 MCG/2ML IJ SOLN
INTRAMUSCULAR | Status: AC
Start: 2016-07-15 — End: 2016-07-15
  Filled 2016-07-15: qty 2

## 2016-07-15 MED ORDER — HEPARIN SODIUM (PORCINE) 1000 UNIT/ML IJ SOLN
INTRAMUSCULAR | Status: AC
  Filled 2016-07-15: qty 1

## 2016-07-15 MED ORDER — SODIUM CHLORIDE 0.9 % IV SOLN: 250.0000 mL | INTRAVENOUS | Status: DC | PRN

## 2016-07-15 MED ORDER — SODIUM CHLORIDE 0.9% FLUSH: 3.0000 mL | INTRAVENOUS | Status: DC | PRN

## 2016-07-15 MED ORDER — FENTANYL CITRATE (PF) 100 MCG/2ML IJ SOLN
INTRAMUSCULAR | Status: DC | PRN
  Administered 2016-07-15: 50 ug via INTRAVENOUS

## 2016-07-15 MED ORDER — ONDANSETRON HCL 4 MG/2ML IJ SOLN: 4.0000 mg | Freq: Four times a day (QID) | INTRAMUSCULAR | Status: DC | PRN

## 2016-07-15 MED ORDER — IOPAMIDOL (ISOVUE-370) INJECTION 76%
INTRAVENOUS | Status: AC
  Filled 2016-07-15: qty 100

## 2016-07-15 MED ORDER — SODIUM CHLORIDE 0.9 % WEIGHT BASED INFUSION: 1.0000 mL/kg/h | INTRAVENOUS | Status: AC

## 2016-07-15 MED ORDER — SODIUM CHLORIDE 0.9 % WEIGHT BASED INFUSION: 1.0000 mL/kg/h | INTRAVENOUS | Status: DC

## 2016-07-15 MED ORDER — ASPIRIN 81 MG PO CHEW
81.0000 mg | CHEWABLE_TABLET | ORAL | Status: AC
  Administered 2016-07-15: 81 mg via ORAL

## 2016-07-15 MED ORDER — SODIUM CHLORIDE 0.9 % WEIGHT BASED INFUSION
3.0000 mL/kg/h | INTRAVENOUS | Status: AC
  Administered 2016-07-15: 3 mL/kg/h via INTRAVENOUS

## 2016-07-15 MED ORDER — ACETAMINOPHEN 325 MG PO TABS: 650.0000 mg | ORAL_TABLET | ORAL | Status: DC | PRN

## 2016-07-15 MED ORDER — ASPIRIN 81 MG PO CHEW
CHEWABLE_TABLET | ORAL | Status: AC
  Administered 2016-07-15: 81 mg via ORAL
  Filled 2016-07-15: qty 1

## 2016-07-15 MED ORDER — HEPARIN (PORCINE) IN NACL 2-0.9 UNIT/ML-% IJ SOLN
INTRAMUSCULAR | Status: AC
  Filled 2016-07-15: qty 1000

## 2016-07-15 MED ORDER — VERAPAMIL HCL 2.5 MG/ML IV SOLN
INTRAVENOUS | Status: DC | PRN
  Administered 2016-07-15: 10 mL via INTRA_ARTERIAL

## 2016-07-15 MED ORDER — HEPARIN (PORCINE) IN NACL 2-0.9 UNIT/ML-% IJ SOLN
INTRAMUSCULAR | Status: DC | PRN
  Administered 2016-07-15: 1000 mL

## 2016-07-15 MED ORDER — MIDAZOLAM HCL 2 MG/2ML IJ SOLN
INTRAMUSCULAR | Status: DC | PRN
  Administered 2016-07-15 (×3): 1 mg via INTRAVENOUS

## 2016-07-15 MED ORDER — VERAPAMIL HCL 2.5 MG/ML IV SOLN
INTRAVENOUS | Status: AC
  Filled 2016-07-15: qty 2

## 2016-07-15 MED ORDER — IOPAMIDOL (ISOVUE-370) INJECTION 76%
INTRAVENOUS | Status: DC | PRN
  Administered 2016-07-15: 130 mL via INTRA_ARTERIAL

## 2016-07-15 MED ORDER — LIDOCAINE HCL (PF) 1 % IJ SOLN
INTRAMUSCULAR | Status: DC | PRN
  Administered 2016-07-15 (×2): 2 mL

## 2016-07-15 SURGICAL SUPPLY — 11 items
CATH BALLN WEDGE 5F 110CM (CATHETERS) ×1 IMPLANT
CATH INFINITI 5FR MULTPACK ANG (CATHETERS) ×1 IMPLANT
DEVICE RAD COMP TR BAND LRG (VASCULAR PRODUCTS) ×2 IMPLANT
GLIDESHEATH SLEND A-KIT 6F 22G (SHEATH) ×1 IMPLANT
KIT HEART LEFT (KITS) ×2 IMPLANT
PACK CARDIAC CATHETERIZATION (CUSTOM PROCEDURE TRAY) ×2 IMPLANT
SHEATH FAST CATH BRACH 5F 5CM (SHEATH) ×1 IMPLANT
TRANSDUCER W/STOPCOCK (MISCELLANEOUS) ×3 IMPLANT
TUBING CIL FLEX 10 FLL-RA (TUBING) ×2 IMPLANT
WIRE HI TORQ VERSACORE-J 145CM (WIRE) ×1 IMPLANT
WIRE SAFE-T 1.5MM-J .035X260CM (WIRE) ×1 IMPLANT

## 2016-07-15 NOTE — CV Procedure (Signed)
   Difficult procedure from the left radial.  Since last procedure, SVG to RCA is totally occluded.  LV systolic dysfunction with regional wall motion abnormality. EF 30%. Moderate mitral regurgitation is noted.  Plan transesophageal echo. Referral to heart failure clinic. Referral for consideration of AICD.

## 2016-07-15 NOTE — H&P (View-Only) (Signed)
Cardiology Office Note    Date:  07/06/2016   ID:  Jahmal, Dunavant 07-13-1941, MRN [id number]  PCP:  Claretta Fraise, MD  Cardiologist: Sinclair Grooms, MD   Chief Complaint  Patient presents with  . Congestive Heart Failure    History of Present Illness:  [name] [name] is a 75 y.o. male combined systolic and diastolic heart failure with recent wall motion studies demonstrating EF 25-30%, COPD, CABG with known history of graft failure with bare-metal stent in the SVG to the RCA, recurrent slow GI bleeding with significant anemia, essential hypertension, and chronic atrial fibrillation.  Two relatively recent hospital stays in March and June demonstrated decreased LV function. On one occasion he was hospitalized with anemia the other occasion, pneumonia. Since the last hospitalization, he has experienced exertional fatigue and dyspnea. He denies chest pain. He does have mild orthopnea. He has not had syncope. He denies palpitations. He denies anginal quality chest pain.  In 2015 a bare-metal stent was placed in the saphenous vein graft to the RCA.  Past Medical History:  Diagnosis Date  . Anemia   . Arthritis    "hips; back" (07/09/2014)  . Asthma   . Atrial fibrillation ([place])   . CAD (coronary artery disease)   . Cholecystitis 11/2013  . 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   . COPD (chronic obstructive pulmonary disease) ([facility])    on home oxygen, 2 liters at night10/2015  . Esophageal reflux   . Gallstones   . Gastric antral vascular ectasia 2013  . Gout   . Hepatomegaly   . Hiatal hernia   . History of blood transfusion ~ 2012   "blood count dropped; had to get 3 units"  . Hyperlipidemia   . Hypothyroidism   . Other and unspecified coagulation defects   . Personal history of colonic polyps 05/29/2010   TUBULAR ADENOMA  . Unspecified essential hypertension     Past Surgical History:  Procedure Laterality Date  . cholecystomy tube  12/28/2014 -  01/06/2015   tube clogged with debris, removed and IR unable to place new tube.   . COLONOSCOPY  2013   Dr. Sharlett Iles: no polyps or evidence of active bleeding  . CORONARY ANGIOPLASTY WITH STENT PLACEMENT  ~ 2008; 07/08/2014   "1 + 3"  . CORONARY ARTERY BYPASS GRAFT  1998   CABG X4  . ESOPHAGOGASTRODUODENOSCOPY  2013   Dr. 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 ESOPHAGOGASTRODUODENOSCOPY N/A 12/12/2015   Procedure: ESOPHAGOGASTRODUODENOSCOPY (EGD);  Surgeon: Gatha Mayer, MD;  Location: [facility];  Service: Endoscopy;  Laterality: N/A;  . GIVENS CAPSULE STUDY  2013   Dr. Sharlett Iles: AVM at 69 and blood at 30 minutes beyond first duodenal image but not actual lesion seen. If persistent IDA, bleeding, recommend enteroscopy with ablation   . HERNIA REPAIR    . LEFT AND RIGHT HEART CATHETERIZATION WITH CORONARY/GRAFT ANGIOGRAM N/A 07/09/2014   Right and left heart cath, bare metal stent to SVG to RCA. Sinclair Grooms, MD;   . UMBILICAL HERNIA REPAIR  [phone]    Current Medications: Outpatient Medications Prior to Visit  Medication Sig Dispense Refill  . acetaminophen (TYLENOL) 500 MG tablet Take 1,000 mg by mouth every 6 (six) hours as needed for mild pain. For pain.    Marland Kitchen BIDIL 20-37.5 MG tablet Take 1 tablet by mouth 3  times daily 270 tablet 0  . budesonide (  PULMICORT) 0.25 MG/2ML nebulizer solution Take 0.25 mg by nebulization 2 (two) times daily.     . Cholecalciferol (VITAMIN D3) 5000 UNITS CAPS Take 5,000 Units by mouth daily.     Marland Kitchen docusate sodium (COLACE) 100 MG capsule Take 100 mg by mouth 2 (two) times daily as needed for mild constipation.    . ferrous sulfate 325 (65 FE) MG tablet Take 325 mg by mouth 2 (two) times daily.    . furosemide (LASIX) 40 MG tablet Take 1 tablet by mouth  daily 90 tablet 1  . HYDROcodone-acetaminophen (NORCO) 5-325 MG tablet Take 1 tablet by mouth every 6 (six) hours as needed for moderate pain. (Patient  not taking: Reported on 06/14/2016) 30 tablet 0  . ipratropium-albuterol (DUONEB) 0.5-2.5 (3) MG/3ML SOLN Take 3 mLs by nebulization every 6 (six) hours as needed. For shortness of breath.     . levothyroxine (SYNTHROID, LEVOTHROID) 112 MCG tablet Take 1 tablet by mouth  daily before breakfast 90 tablet 0  . lovastatin (MEVACOR) 40 MG tablet Take 1 tablet by mouth at  bedtime (Patient taking differently: Take 40 mg by mouth at  bedtime) 90 tablet 0  . Multiple Vitamins-Minerals (MULTIVITAMINS THER. W/MINERALS) TABS Take 1 tablet by mouth daily.      . nitroGLYCERIN (NITROSTAT) 0.4 MG SL tablet Place 0.4 mg under the tongue every 5 (five) minutes as needed for chest pain.    . pantoprazole (PROTONIX) 40 MG tablet Take 1 tablet (40 mg total) by mouth 2 (two) times daily. 180 tablet 3  . polyethylene glycol powder (GLYCOLAX/MIRALAX) powder Take 1 capful (17 grams) dissolved in at least 8 ounces water/juice once daily. 527 g 4  . potassium chloride (K-DUR,KLOR-CON) 10 MEQ tablet Take 1 tablet by mouth  daily (Patient taking differently: Take 10 meq by mouth  daily) 90 tablet 1  . PROAIR HFA 108 (90 Base) MCG/ACT inhaler Use 2 puffs every 6 hours  as needed for shortness of  breath 34 g 3  . sucralfate (CARAFATE) 1 g tablet Take 1 tablet (1 g total) by mouth 4 (four) times daily -  with meals and at bedtime. 360 tablet 5  . warfarin (COUMADIN) 5 MG tablet TAKE 1 TABLET BY MOUTH  EVERY DAY OR AS DIRECTED BY ANTICOAGULATION CLINIC 90 tablet 1   No facility-administered medications prior to visit.      Allergies:   Review of patient's allergies indicates no known allergies.   Social History   Social History  . Marital status: Married    Spouse name: [name]  . Number of children: 2  . Years of education: N/A   Occupational History  . retired    Social History Main Topics  . Smoking status: Former Smoker    Packs/day: 1.00    Years: 42.00    Types: Cigarettes    Quit date: 12/16/1996  .  Smokeless tobacco: Never Used  . Alcohol use 0.6 oz/week    1 Cans of beer per week     Comment: 1 per week  . Drug use: No  . Sexual activity: No   Other Topics Concern  . None   Social History Narrative  . None     Family History:  The patient's family history includes Cancer in his father; Early death in his brother; Heart attack in his brother; Heart disease in his father; Kidney disease in his mother; Leukemia in his mother; Stomach cancer in his sister and sister.   ROS:  Please see the history of present illness.    PNA, , easy bruising, no history of stroke, and no angina.  All other systems reviewed and are negative.   PHYSICAL EXAM:   VS:  BP (!) 132/52   Pulse (!) 56   Ht 5\' 10"  (1.778 m)   Wt 183 lb (83 kg)   BMI 26.26 kg/m    GEN: Well nourished, well developed, in no acute distress  HEENT: normal  Neck: no JVD, carotid bruits, or masses Cardiac: IIRR; no murmurs, rubs, or gallops,no edema  Respiratory:  clear to auscultation bilaterally, normal work of breathing GI: soft, nontender, nondistended, + BS MS: no deformity or atrophy  Skin: warm and dry, no rash Neuro:  Alert and Oriented x 3, Strength and sensation are intact Psych: euthymic mood, full affect  Wt Readings from Last 3 Encounters:  07/06/16 183 lb (83 kg)  06/14/16 186 lb 8 oz (84.6 kg)  04/22/16 187 lb (84.8 kg)      Studies/Labs Reviewed:   EKG:  EKG  Last EKG in June revealed right bundle branch block, atrial fibrillation with relatively slow ventricular response.  Recent Labs: 12/08/2015: Magnesium 2.3 03/22/2016: B Natriuretic Peptide 520.5 04/08/2016: ALT 9; BUN 20; Creatinine, Ser 1.46; Potassium 4.5; Sodium 144 05/05/2016: Hemoglobin 9.4; Platelets 564.0   Lipid Panel    Component Value Date/Time   CHOL 134 09/24/2015 0824   TRIG 101 09/24/2015 0824   HDL 44 09/24/2015 0824   CHOLHDL 3.0 09/24/2015 0824   CHOLHDL 4 07/05/2014 0937   VLDL 29.4 07/05/2014 0937   LDLCALC 70  09/24/2015 0824    Additional studies/ records that were reviewed today include:  Echocardiogram 2017 Study Conclusions  - Left ventricle: The cavity size was normal. There was mild to   moderate concentric hypertrophy. Systolic function was severely   reduced. The estimated ejection fraction was in the range of 25%   to 30%. Global hypokinesis of worse in the anterolateral   myocardium. The study is not technically sufficient to allow   evaluation of LV diastolic function. - Aortic valve: Transvalvular velocity was within the normal range.   There was no stenosis. There was no regurgitation. - Mitral valve: Calcified annulus. There was mild regurgitation. - Left atrium: The atrium was severely dilated. - Right ventricle: The cavity size was mildly dilated. Wall   thickness was normal. Systolic function was reduced. - Right atrium: The atrium was severely dilated. - Tricuspid valve: There was trivial regurgitation.   ASSESSMENT:    1. Atherosclerosis of native coronary artery of native heart without angina pectoris   2. Essential hypertension   3. Chronic systolic CHF (congestive heart failure) ([place])   4. Atrial fibrillation, chronic ([place])   5. CKD (chronic kidney disease), stage III   6. Anticoagulant long-term use   7. Pure hypercholesterolemia      PLAN:  In order of problems listed above:  1. Known graft failure. No recent angina. Known bare-metal stent and SVG to RCA. New LV dysfunction.We need to exclude graft failure as a cause of LV dysfunction. 2. Well controlled. 3. No evidence of volume overload. EF now less than 35%. Rule out bypass graft failure. Needs left and right heart cath with coronary angiography to help better define etiology of decreased LV function. 4. We need to be careful with contrast exposure given kidney dysfunction. Bmet, INR, and hemoglobin will be done the day prior to angiography. 5. Will need to limit contrast administration. We'll  probably  not performed LV gram. 6. Will need to hold Coumadin 3 days prior to the procedure. INR along with other labs will be checked the day prior to heart cath. Plan to use the radial and brachial approach.I have decided against bridging. He has GI bleeding issues.  The patient was counseled to undergo left and right heart catheterization, coronary angiography, and possible percutaneous coronary intervention with stent implantation. The procedural risks and benefits were discussed in detail. The risks discussed included death, stroke, myocardial infarction, life-threatening bleeding, limb ischemia, kidney injury, allergy, and possible emergency cardiac surgery. The risk of these significant complications were estimated to occur less than 1% of the time. After discussion, the patient has agreed to proceed.    Medication Adjustments/Labs and Tests Ordered: Current medicines are reviewed at length with the patient today.  Concerns regarding medicines are outlined above.  Medication changes, Labs and Tests ordered today are listed in the Patient Instructions below. Patient Instructions  Medication Instructions:  Your physician recommends that you continue on your current medications as directed. Please refer to the Current Medication list given to you today.  See catheterization instruction sheet    Labwork: Your physician recommends that you return for lab work the morning of October 11,2017. The lab opens at 7:30 AM   Testing/Procedures: Your physician has requested that you have a cardiac catheterization. Cardiac catheterization is used to diagnose and/or treat various heart conditions. Doctors may recommend this procedure for a number of different reasons. The most common reason is to evaluate chest pain. Chest pain can be a symptom of coronary artery disease (CAD), and cardiac catheterization can show whether plaque is narrowing or blocking your heart's arteries. This procedure is also used to evaluate  the valves, as well as measure the blood flow and oxygen levels in different parts of your heart. For further information please visit [email or web address]. Please follow instruction sheet, as given. Scheduled for October 12,2017    Follow-Up: To be arranged after catheterization.   Any Other Special Instructions Will Be Listed Below (If Applicable).     If you need a refill on your cardiac medications before your next appointment, please call your pharmacy.      Signed, Sinclair Grooms, MD  07/06/2016 1:40 PM    [facility] [address], [place], [place]  [address] Phone: [phone]; Fax: [phone]

## 2016-07-15 NOTE — Discharge Instructions (Signed)
Radial Site Care °Refer to this sheet in the next few weeks. These instructions provide you with information about caring for yourself after your procedure. Your health care provider may also give you more specific instructions. Your treatment has been planned according to current medical practices, but problems sometimes occur. Call your health care provider if you have any problems or questions after your procedure. °WHAT TO EXPECT AFTER THE PROCEDURE °After your procedure, it is typical to have the following: °· Bruising at the radial site that usually fades within 1-2 weeks. °· Blood collecting in the tissue (hematoma) that may be painful to the touch. It should usually decrease in size and tenderness within 1-2 weeks. °HOME CARE INSTRUCTIONS °· Take medicines only as directed by your health care provider. °· You may shower 24-48 hours after the procedure or as directed by your health care provider. Remove the bandage (dressing) and gently wash the site with plain soap and water. Pat the area dry with a clean towel. Do not rub the site, because this may cause bleeding. °· Do not take baths, swim, or use a hot tub until your health care provider approves. °· Check your insertion site every day for redness, swelling, or drainage. °· Do not apply powder or lotion to the site. °· Do not flex or bend the affected arm for 24 hours or as directed by your health care provider. °· Do not push or pull heavy objects with the affected arm for 24 hours or as directed by your health care provider. °· Do not lift over 10 lb (4.5 kg) for 5 days after your procedure or as directed by your health care provider. °· Ask your health care provider when it is okay to: °¨ Return to work or school. °¨ Resume usual physical activities or sports. °¨ Resume sexual activity. °· Do not drive home if you are discharged the same day as the procedure. Have someone else drive you. °· You may drive 24 hours after the procedure unless otherwise  instructed by your health care provider. °· Do not operate machinery or power tools for 24 hours after the procedure. °· If your procedure was done as an outpatient procedure, which means that you went home the same day as your procedure, a responsible adult should be with you for the first 24 hours after you arrive home. °· Keep all follow-up visits as directed by your health care provider. This is important. °SEEK MEDICAL CARE IF: °· You have a fever. °· You have chills. °· You have increased bleeding from the radial site. Hold pressure on the site. °SEEK IMMEDIATE MEDICAL CARE IF: °· You have unusual pain at the radial site. °· You have redness, warmth, or swelling at the radial site. °· You have drainage (other than a small amount of blood on the dressing) from the radial site. °· The radial site is bleeding, and the bleeding does not stop after 30 minutes of holding steady pressure on the site. °· Your arm or hand becomes pale, cool, tingly, or numb. °  °This information is not intended to replace advice given to you by your health care provider. Make sure you discuss any questions you have with your health care provider. °  °Document Released: 10/23/2010 Document Revised: 10/11/2014 Document Reviewed: 04/08/2014 °Elsevier Interactive Patient Education ©2016 [employer] ° °

## 2016-07-15 NOTE — Interval H&P Note (Signed)
Cath Lab Visit (complete for each Cath Lab visit)  Clinical Evaluation Leading to the Procedure:   ACS: No.  Non-ACS:    Anginal Classification: CCS III  Anti-ischemic medical therapy: Minimal Therapy (1 class of medications)  Non-Invasive Test Results: No non-invasive testing performed  Prior CABG: Previous CABG      History and Physical Interval Note:  07/15/2016 7:07 AM  [name] [name]  has presented today for surgery, with the diagnosis of cad  The various methods of treatment have been discussed with the patient and family. After consideration of risks, benefits and other options for treatment, the patient has consented to  Procedure(s): Right/Left Heart Cath and Coronary/Graft Angiography (N/A) as a surgical intervention .  The patient's history has been reviewed, patient examined, no change in status, stable for surgery.  I have reviewed the patient's chart and labs.  Questions were answered to the patient's satisfaction.     Belva Crome III

## 2016-07-16 ENCOUNTER — Encounter (HOSPITAL_COMMUNITY): Payer: Self-pay | Admitting: Interventional Cardiology

## 2016-07-19 ENCOUNTER — Other Ambulatory Visit: Payer: Self-pay | Admitting: Interventional Cardiology

## 2016-07-19 MED ORDER — ISOSORB DINITRATE-HYDRALAZINE 20-37.5 MG PO TABS: 1.0000 | ORAL_TABLET | Freq: Three times a day (TID) | ORAL | 3 refills | Status: DC

## 2016-07-26 ENCOUNTER — Telehealth: Payer: Self-pay | Admitting: Interventional Cardiology

## 2016-07-26 NOTE — Telephone Encounter (Signed)
Spoke with [name] [name] and he states that Dr. Tamala Julian said he was going to speak with some other doctors about his medications and then have me call him.  Advised [name] [name] I will send message to Dr. Tamala Julian and call him back once a decision is made about medications.  [name] [name] verbalized understanding.

## 2016-07-26 NOTE — Telephone Encounter (Signed)
He needs to be referred to EP for consideration of defibrillator.  Please refer to the advanced heart failure clinic for management of heart failure.

## 2016-07-27 ENCOUNTER — Other Ambulatory Visit: Payer: Self-pay | Admitting: Nurse Practitioner

## 2016-07-28 DIAGNOSIS — J449 Chronic obstructive pulmonary disease, unspecified: Secondary | ICD-10-CM | POA: Diagnosis not present

## 2016-07-28 NOTE — Telephone Encounter (Signed)
Spoke with wife, DPR on file, advised to have pt call back if he had any questions.  Advised her of two referrals per Dr. Tamala Julian.  Advised I will send message to both depts and have them call to get pt scheduled.  Wife verbalized understanding and was in agreement with this plan.

## 2016-07-28 NOTE — Telephone Encounter (Signed)
appt sch 11/13

## 2016-07-28 NOTE — Telephone Encounter (Signed)
Pt aware. Has been in out of hospital recently

## 2016-08-04 DIAGNOSIS — J449 Chronic obstructive pulmonary disease, unspecified: Secondary | ICD-10-CM | POA: Diagnosis not present

## 2016-08-06 ENCOUNTER — Ambulatory Visit (INDEPENDENT_AMBULATORY_CARE_PROVIDER_SITE_OTHER): Payer: Medicare Other

## 2016-08-06 DIAGNOSIS — Z23 Encounter for immunization: Secondary | ICD-10-CM | POA: Diagnosis not present

## 2016-08-10 ENCOUNTER — Encounter: Payer: Self-pay | Admitting: Internal Medicine

## 2016-08-10 ENCOUNTER — Ambulatory Visit (INDEPENDENT_AMBULATORY_CARE_PROVIDER_SITE_OTHER): Payer: Medicare Other | Admitting: Internal Medicine

## 2016-08-10 VITALS — BP 132/46 | HR 67 | Ht 70.0 in | Wt 182.8 lb

## 2016-08-10 DIAGNOSIS — I509 Heart failure, unspecified: Secondary | ICD-10-CM

## 2016-08-10 NOTE — Progress Notes (Signed)
HPI [name] is referred by Dr. Tamala Julian for consideration of ICD insertion. He is a pleasant 75 yo man with longstanding persistent atrial fib, CAD, s/p remote stenting and CABG who has had known SVG failure. The patient has not had syncope and has class 2 CHF symptoms. He exercises most days. No edema. He denies anginal symptoms although recent heart cath demonstrated that his grafts were severely diseased.  No Known Allergies   Current Outpatient Prescriptions  Medication Sig Dispense Refill  . acetaminophen (TYLENOL) 500 MG tablet Take 1,000 mg by mouth every 6 (six) hours as needed for mild pain. For pain.    Marland Kitchen amLODipine (NORVASC) 10 MG tablet Take 1 tablet (10 mg total) by mouth daily. 90 tablet 3  . budesonide (PULMICORT) 0.25 MG/2ML nebulizer solution Take 0.25 mg by nebulization 2 (two) times daily as needed (shortness of breath).     . Cholecalciferol (VITAMIN D3) 5000 UNITS CAPS Take 5,000 Units by mouth daily.     Marland Kitchen docusate sodium (COLACE) 100 MG capsule Take 100 mg by mouth 2 (two) times daily as needed for mild constipation.    . ferrous sulfate 325 (65 FE) MG tablet Take 325 mg by mouth 2 (two) times daily.    . furosemide (LASIX) 40 MG tablet Take 1 tablet by mouth  daily 90 tablet 1  . HYDROcodone-acetaminophen (NORCO) 5-325 MG tablet Take 1 tablet by mouth every 6 (six) hours as needed for moderate pain. (Patient not taking: Reported on 06/14/2016) 30 tablet 0  . ipratropium-albuterol (DUONEB) 0.5-2.5 (3) MG/3ML SOLN Take 3 mLs by nebulization every 6 (six) hours as needed. For shortness of breath.     . isosorbide-hydrALAZINE (BIDIL) 20-37.5 MG tablet Take 1 tablet by mouth 3 (three) times daily. 270 tablet 3  . levothyroxine (SYNTHROID, LEVOTHROID) 112 MCG tablet TAKE 1 TABLET BY MOUTH  DAILY BEFORE BREAKFAST 30 tablet 0  . loratadine (CLARITIN) 10 MG tablet Take 10 mg by mouth daily after supper.    . lovastatin (MEVACOR) 40 MG tablet Take 1 tablet by mouth at   bedtime (Patient taking differently: Take 40 mg by mouth at  bedtime) 90 tablet 0  . Multiple Vitamins-Minerals (MULTIVITAMINS THER. W/MINERALS) TABS Take 1 tablet by mouth daily.      . nitroGLYCERIN (NITROSTAT) 0.4 MG SL tablet Place 0.4 mg under the tongue every 5 (five) minutes as needed for chest pain.    . pantoprazole (PROTONIX) 40 MG tablet Take 1 tablet (40 mg total) by mouth 2 (two) times daily. 180 tablet 3  . polyethylene glycol powder (GLYCOLAX/MIRALAX) powder Take 1 capful (17 grams) dissolved in at least 8 ounces water/juice once daily. 527 g 4  . potassium chloride (K-DUR,KLOR-CON) 10 MEQ tablet Take 1 tablet by mouth  daily (Patient taking differently: Take 10 meq by mouth  daily) 90 tablet 1  . PROAIR HFA 108 (90 Base) MCG/ACT inhaler Use 2 puffs every 6 hours  as needed for shortness of  breath 34 g 3  . sucralfate (CARAFATE) 1 g tablet Take 1 tablet (1 g total) by mouth 4 (four) times daily -  with meals and at bedtime. 360 tablet 5  . warfarin (COUMADIN) 5 MG tablet TAKE 1 TABLET BY MOUTH  EVERY DAY OR AS DIRECTED BY ANTICOAGULATION CLINIC (Patient taking differently: TAKING 5 MG EVERY MONDAY, WEDNESDAY AND FRIDAY AND 2.5 MG ALL OTHER DAYS OF THE WEEK) 90 tablet 1   No current facility-administered medications for  this visit.      Past Medical History:  Diagnosis Date  . Anemia   . Arthritis    "hips; back" (07/09/2014)  . Asthma   . Atrial fibrillation ([place])   . CAD (coronary artery disease)   . Cholecystitis 11/2013  . 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   . COPD (chronic obstructive pulmonary disease) ([facility])    on home oxygen, 2 liters at night10/2015  . Esophageal reflux   . Gallstones   . Gastric antral vascular ectasia 2013  . Gout   . Hepatomegaly   . Hiatal hernia   . History of blood transfusion ~ 2012   "blood count dropped; had to get 3 units"  . Hyperlipidemia   . Hypothyroidism   . Other and unspecified coagulation defects   . Personal  history of colonic polyps 05/29/2010   TUBULAR ADENOMA  . Unspecified essential hypertension     ROS:   All systems reviewed and negative except as noted in the HPI.   Past Surgical History:  Procedure Laterality Date  . CARDIAC CATHETERIZATION N/A 07/15/2016   Procedure: Right/Left Heart Cath and Coronary/Graft Angiography;  Surgeon: Belva Crome, MD;  Location: [facility];  Service: Cardiovascular;  Laterality: N/A;  . cholecystomy tube  12/28/2014 - 01/06/2015   tube clogged with debris, removed and IR unable to place new tube.   . COLONOSCOPY  2013   Dr. Sharlett Iles: no polyps or evidence of active bleeding  . CORONARY ANGIOPLASTY WITH STENT PLACEMENT  ~ 2008; 07/08/2014   "1 + 3"  . CORONARY ARTERY BYPASS GRAFT  1998   CABG X4  . ESOPHAGOGASTRODUODENOSCOPY  2013   Dr. 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 ESOPHAGOGASTRODUODENOSCOPY N/A 12/12/2015   Procedure: ESOPHAGOGASTRODUODENOSCOPY (EGD);  Surgeon: Gatha Mayer, MD;  Location: [facility];  Service: Endoscopy;  Laterality: N/A;  . GIVENS CAPSULE STUDY  2013   Dr. Sharlett Iles: AVM at 45 and blood at 30 minutes beyond first duodenal image but not actual lesion seen. If persistent IDA, bleeding, recommend enteroscopy with ablation   . HERNIA REPAIR    . LEFT AND RIGHT HEART CATHETERIZATION WITH CORONARY/GRAFT ANGIOGRAM N/A 07/09/2014   Right and left heart cath, bare metal stent to SVG to RCA. Sinclair Grooms, MD;   . UMBILICAL HERNIA REPAIR  [phone]     Family History  Problem Relation Age of Onset  . Leukemia Mother   . Kidney disease Mother     kidney removed   . Heart attack Brother   . Cancer Father   . Heart disease Father   . Stomach cancer Sister   . Stomach cancer Sister   . Early death Brother   . Hypertension      family   . Colon cancer Neg Hx      Social History   Social History  . Marital status: Married    Spouse name: [name]  . Number  of children: 2  . Years of education: N/A   Occupational History  . retired    Social History Main Topics  . Smoking status: Former Smoker    Packs/day: 1.00    Years: 42.00    Types: Cigarettes    Quit date: 12/16/1996  . Smokeless tobacco: Never Used  . Alcohol use 0.6 oz/week    1 Cans of beer per week     Comment: 1 per week  . Drug  use: No  . Sexual activity: No   Other Topics Concern  . Not on file   Social History Narrative  . No narrative on file     BP (!) 132/46   Pulse 67   Ht 5\' 10"  (1.778 m)   Wt 182 lb 12.8 oz (82.9 kg)   SpO2 93%   BMI 26.23 kg/m   Physical Exam:  Well appearing NAD HEENT: Unremarkable Neck:  No JVD, no thyromegally Lymphatics:  No adenopathy Back:  No CVA tenderness Lungs:  Clear HEART:  Regular rate rhythm, no murmurs, no rubs, no clicks Abd:  soft, positive bowel sounds, no organomegally, no rebound, no guarding Ext:  2 plus pulses, no edema, no cyanosis, no clubbing Skin:  No rashes no nodules Neuro:  CN II through XII intact, motor grossly intact  EKG - atrial fib with RBBB and a controlled Vr  DEVICE  Normal device function.  See PaceArt for details.   Assess/Plan: 1. Atrial fib - his ventricular rate is controlled. Will follow 2. Chronic systolic heart failure - his symptoms are class 2. He has an EF of 30% by cath. It is unclear why he is not on a beta blocker or an ACE inhibitor. He does not know why and I have looked through the chart and am unclear. I note that he is seeing our CHF clinic in a few days. I would anticipate he undergo repeat 2D echo once we know what his LV function is on optimal medical therapy.  3.  CAD - he is s/p CABG with occluded grafts. He will continue his current meds. He does not have much anginal pain. Will follow.  Mikle Bosworth.D.Dictation #1 [id number]  [id number]

## 2016-08-10 NOTE — Patient Instructions (Addendum)
Medication Instructions:  Your physician recommends that you continue on your current medications as directed. Please refer to the Current Medication list given to you today.   Labwork: None Ordered   Testing/Procedures: None Ordered   Follow-Up: Follow-up with Dr. Taylor as needed    Any Other Special Instructions Will Be Listed Below (If Applicable).     If you need a refill on your cardiac medications before your next appointment, please call your pharmacy.   

## 2016-08-16 ENCOUNTER — Encounter (HOSPITAL_COMMUNITY): Payer: Self-pay

## 2016-08-16 ENCOUNTER — Ambulatory Visit (HOSPITAL_COMMUNITY)
Admission: RE | Admit: 2016-08-16 | Discharge: 2016-08-16 | Disposition: A | Discharge status: Home / Self Care | Payer: Medicare Other | Source: Ambulatory Visit | Attending: Cardiology | Admitting: Cardiology

## 2016-08-16 VITALS — BP 136/46 | HR 68 | Wt 184.0 lb

## 2016-08-16 DIAGNOSIS — K922 Gastrointestinal hemorrhage, unspecified: Secondary | ICD-10-CM | POA: Insufficient documentation

## 2016-08-16 DIAGNOSIS — Z800 Family history of malignant neoplasm of digestive organs: Secondary | ICD-10-CM | POA: Insufficient documentation

## 2016-08-16 DIAGNOSIS — K31819 Angiodysplasia of stomach and duodenum without bleeding: Secondary | ICD-10-CM | POA: Diagnosis not present

## 2016-08-16 DIAGNOSIS — N183 Chronic kidney disease, stage 3 unspecified: Secondary | ICD-10-CM

## 2016-08-16 DIAGNOSIS — Z9889 Other specified postprocedural states: Secondary | ICD-10-CM | POA: Diagnosis not present

## 2016-08-16 DIAGNOSIS — Z806 Family history of leukemia: Secondary | ICD-10-CM | POA: Insufficient documentation

## 2016-08-16 DIAGNOSIS — Z8249 Family history of ischemic heart disease and other diseases of the circulatory system: Secondary | ICD-10-CM | POA: Insufficient documentation

## 2016-08-16 DIAGNOSIS — I130 Hypertensive heart and chronic kidney disease with heart failure and stage 1 through stage 4 chronic kidney disease, or unspecified chronic kidney disease: Secondary | ICD-10-CM | POA: Insufficient documentation

## 2016-08-16 DIAGNOSIS — I5022 Chronic systolic (congestive) heart failure: Secondary | ICD-10-CM | POA: Diagnosis not present

## 2016-08-16 DIAGNOSIS — E039 Hypothyroidism, unspecified: Secondary | ICD-10-CM | POA: Diagnosis not present

## 2016-08-16 DIAGNOSIS — Z9981 Dependence on supplemental oxygen: Secondary | ICD-10-CM | POA: Diagnosis not present

## 2016-08-16 DIAGNOSIS — M109 Gout, unspecified: Secondary | ICD-10-CM | POA: Diagnosis not present

## 2016-08-16 DIAGNOSIS — Z87891 Personal history of nicotine dependence: Secondary | ICD-10-CM | POA: Diagnosis not present

## 2016-08-16 DIAGNOSIS — J449 Chronic obstructive pulmonary disease, unspecified: Secondary | ICD-10-CM | POA: Insufficient documentation

## 2016-08-16 DIAGNOSIS — I2510 Atherosclerotic heart disease of native coronary artery without angina pectoris: Secondary | ICD-10-CM | POA: Diagnosis not present

## 2016-08-16 DIAGNOSIS — Z951 Presence of aortocoronary bypass graft: Secondary | ICD-10-CM | POA: Diagnosis not present

## 2016-08-16 DIAGNOSIS — E785 Hyperlipidemia, unspecified: Secondary | ICD-10-CM | POA: Insufficient documentation

## 2016-08-16 DIAGNOSIS — I2582 Chronic total occlusion of coronary artery: Secondary | ICD-10-CM | POA: Insufficient documentation

## 2016-08-16 DIAGNOSIS — I482 Chronic atrial fibrillation, unspecified: Secondary | ICD-10-CM

## 2016-08-16 DIAGNOSIS — Z841 Family history of disorders of kidney and ureter: Secondary | ICD-10-CM | POA: Insufficient documentation

## 2016-08-16 DIAGNOSIS — Z905 Acquired absence of kidney: Secondary | ICD-10-CM | POA: Diagnosis not present

## 2016-08-16 DIAGNOSIS — Z7901 Long term (current) use of anticoagulants: Secondary | ICD-10-CM | POA: Diagnosis not present

## 2016-08-16 MED ORDER — CARVEDILOL 3.125 MG PO TABS: 3.1250 mg | ORAL_TABLET | Freq: Two times a day (BID) | ORAL | 3 refills | Status: DC

## 2016-08-16 MED ORDER — FUROSEMIDE 40 MG PO TABS: ORAL_TABLET | ORAL | 3 refills | Status: DC

## 2016-08-16 MED ORDER — SACUBITRIL-VALSARTAN 24-26 MG PO TABS: 1.0000 | ORAL_TABLET | Freq: Two times a day (BID) | ORAL | 3 refills | Status: DC

## 2016-08-16 NOTE — Patient Instructions (Signed)
Increase Lasix to 40 mg (1 Tab) in the morning, and 20 mg (1/2 Tab) in the evening  Stop taking Amlodipine  Start taking Entresto 24/26 mg (1 Tab) Twice a day  Start taking Coreg 3.125 mg (1 Tab) Twice a day  Follow up in 3 weeks

## 2016-08-17 NOTE — Progress Notes (Signed)
PCP: Dr. Livia Snellen Cardiology: Dr. Tamala Julian HF Cardiology: Dr. Aundra Dubin  75 yo with chronic atrial fibrillation, CAD s/p CABG, and chronic systolic CHF presents for CHF clinic evaluation.  Patient had CABG in 1998.  He has had chronic atrial fibrillation long-term.  Last echo in 6/17 showed EF 25-30%.  Most recent cardiac cath was in 10/17.  This showed occluded native vessels with occluded SVG-dLAD and SVG-RCA.  SVG-OM and LIMA-LAD were patent (LIMA had stable 85% ostial stenosis).  No interventional options, medically managed.  Cardiac output was preserved, filling pressures were mildly elevated.    Patient was sent to Dr Lovena Le for consideration of ICD.  He was on minimal HF meds, so it was recommended that he start HF meds and repeat an echo prior to ICD placement.    He has been fairly stable recently, but has chronic significant exertional dyspnea.  He rides an exercise bike and uses weights at the [facility] (goes 4-5 times/week).  He is short of breath after walking 30-40 yards or walking up stairs.  No orthopnea/PND.  He is able to play golf using a cart.  No chest pain.  No BRBPR or melena.  No lightheadedness or palpitations.    ECG: atrial fibrillation, RBBB, LPFB  Labs (10/17): hgb 10.2, K 4.1, creatinine 1.3  PMH: 1. Gout 2. Hyperlipidemia 3. Lower GI bleed: Colonic AVMs. 4. Atrial fibrillation: Chronic 5. HTN 6. 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 CAD: CABG x 4 in 1998. - LHC (10/17) with total occlusion SVG-dLAD, total occlusion SVG-RCA (new), patent SVG-OM, 85% ostial LIMA-LAD (unchanged), total occlusion of native LAD, LCx, and RCA.  No interventional option.  11. Chronic systolic CHF: Ischemic cardiomyopathy.   - Echo (6/17) with EF 25-30%, mild MR, mildly dilated RV with mildly decreased systolic function.   - RHC (10/17): mean RA 11, PA 49/14, mean PCWP 17, CI 3.84.   SH: Married, quit smoking 1998, retired  from [employer], lives in [place].    Family History  Problem Relation Age of Onset  . Leukemia Mother   . Kidney disease Mother     kidney removed   . Heart attack Brother   . Cancer Father   . Heart disease Father   . Stomach cancer Sister   . Stomach cancer Sister   . Early death Brother   . Hypertension      family   . Colon cancer Neg Hx    ROS: All systems reviewed and negative except as per HPI.   Current Outpatient Prescriptions  Medication Sig Dispense Refill  . acetaminophen (TYLENOL) 500 MG tablet Take 1,000 mg by mouth every 6 (six) hours as needed for mild pain. For pain.    . budesonide (PULMICORT) 0.25 MG/2ML nebulizer solution Take 0.25 mg by nebulization 2 (two) times daily as needed (shortness of breath).     . Cholecalciferol (VITAMIN D3) 5000 UNITS CAPS Take 5,000 Units by mouth daily.     Marland Kitchen docusate sodium (COLACE) 100 MG capsule Take 100 mg by mouth 2 (two) times daily as needed for mild constipation.    . ferrous sulfate 325 (65 FE) MG tablet Take 325 mg by mouth 2 (two) times daily.    . furosemide (LASIX) 40 MG tablet Take 40 mg (1 Tab) in the morning, take 20 mg (1/2 tab) in the evening 45 tablet 3  . HYDROcodone-acetaminophen (NORCO) 5-325 MG tablet Take 1 tablet by  mouth every 6 (six) hours as needed for moderate pain. 30 tablet 0  . ipratropium-albuterol (DUONEB) 0.5-2.5 (3) MG/3ML SOLN Take 3 mLs by nebulization every 6 (six) hours as needed. For shortness of breath.     . isosorbide-hydrALAZINE (BIDIL) 20-37.5 MG tablet Take 1 tablet by mouth 3 (three) times daily. 270 tablet 3  . levothyroxine (SYNTHROID, LEVOTHROID) 112 MCG tablet TAKE 1 TABLET BY MOUTH  DAILY BEFORE BREAKFAST 30 tablet 0  . loratadine (CLARITIN) 10 MG tablet Take 10 mg by mouth daily after supper.    . lovastatin (MEVACOR) 40 MG tablet Take 1 tablet by mouth at  bedtime (Patient taking differently: Take 40 mg by mouth at  bedtime) 90 tablet 0  . Multiple Vitamins-Minerals  (MULTIVITAMINS THER. W/MINERALS) TABS Take 1 tablet by mouth daily.      . pantoprazole (PROTONIX) 40 MG tablet Take 1 tablet (40 mg total) by mouth 2 (two) times daily. 180 tablet 3  . polyethylene glycol powder (GLYCOLAX/MIRALAX) powder Take 1 capful (17 grams) dissolved in at least 8 ounces water/juice once daily. 527 g 4  . potassium chloride (K-DUR,KLOR-CON) 10 MEQ tablet Take 1 tablet by mouth  daily 90 tablet 1  . PROAIR HFA 108 (90 Base) MCG/ACT inhaler Use 2 puffs every 6 hours  as needed for shortness of  breath 34 g 3  . sucralfate (CARAFATE) 1 g tablet Take 1 tablet (1 g total) by mouth 4 (four) times daily -  with meals and at bedtime. 360 tablet 5  . warfarin (COUMADIN) 5 MG tablet TAKE 1 TABLET BY MOUTH  EVERY DAY OR AS DIRECTED BY ANTICOAGULATION CLINIC (Patient taking differently: TAKING 5 MG EVERY MONDAY, WEDNESDAY AND FRIDAY AND 2.5 MG ALL OTHER DAYS OF THE WEEK) 90 tablet 1  . carvedilol (COREG) 3.125 MG tablet Take 1 tablet (3.125 mg total) by mouth 2 (two) times daily. 60 tablet 3  . nitroGLYCERIN (NITROSTAT) 0.4 MG SL tablet Place 0.4 mg under the tongue every 5 (five) minutes as needed for chest pain.    . sacubitril-valsartan (ENTRESTO) 24-26 MG Take 1 tablet by mouth 2 (two) times daily. 60 tablet 3   No current facility-administered medications for this encounter.    BP (!) 136/46   Pulse 68   Wt 184 lb (83.5 kg)   SpO2 97%   BMI 26.40 kg/m  General: NAD Neck: JVP 8-9 cm with HJR, no thyromegaly or thyroid nodule.  Lungs: Clear to auscultation bilaterally with normal respiratory effort. CV: Nondisplaced PMI.  Heart irregular S1/S2, no S3/S4, 2/6 HSM apex/LLSB.  Trace ankle edema.  No carotid bruit.  Normal pedal pulses.  Abdomen: Soft, nontender, no hepatosplenomegaly, no distention.  Skin: Intact without lesions or rashes.  Neurologic: Alert and oriented x 3.  Psych: Normal affect. Extremities: No clubbing or cyanosis.  HEENT: Normal.   Assessment/Plan: 1.  CAD: s/p CABG 1998.  Occluded native vessels, patent SVG-OM and LIMA-LAD (chronic 85% ostial LIMA stenosis).  No chest pain.  No good interventional options.  - No ASA given stable CAD and use of warfarin.  - Continue statin.  2. Chronic systolic CHF: Ischemic cardiomyopathy.  Echo (6/17) with EF 25-30%, mildly dilated RV with decreased RV systolic function.  NYHA class III symptoms.  On exam, he does have some volume overload.   - Stop amlodipine, start Entresto 24/26 bid.  BMET/BNP in 1 week.  - Increase Lasix to 40 qam, 20 qpm.  - Add Coreg 3.125 mg bid.  -  Can continue Bidil for now, but would likely stop this and instead add spironolactone and titrate up on ARNI/beta blocker in the future.  - Repeat echo in 3-4 mos after medication titration.  He would be a reasonable ICD candidate if EF remains low.  He would not qualify for CRT.  3. Atrial fibrillation: Chronic.  Unlikely to be able to cardiovert at this point.  Continue warfarin.  4. CKD stage 3: Follow creatinine closely with medication adjustments.   5. COPD: Stable, uses oxygen at night.   Loralie Champagne 08/17/2016

## 2016-08-23 ENCOUNTER — Telehealth: Payer: Self-pay | Admitting: Family Medicine

## 2016-08-23 ENCOUNTER — Ambulatory Visit (INDEPENDENT_AMBULATORY_CARE_PROVIDER_SITE_OTHER): Payer: Medicare Other | Admitting: Pharmacist

## 2016-08-23 DIAGNOSIS — Z7901 Long term (current) use of anticoagulants: Secondary | ICD-10-CM

## 2016-08-23 DIAGNOSIS — I482 Chronic atrial fibrillation, unspecified: Secondary | ICD-10-CM

## 2016-08-23 DIAGNOSIS — I5022 Chronic systolic (congestive) heart failure: Secondary | ICD-10-CM

## 2016-08-23 LAB — COAGUCHEK XS/INR WAIVED
INR: 1.7 — ABNORMAL HIGH (ref 0.9–1.1)
PROTHROMBIN TIME: 20.4 s

## 2016-08-24 LAB — BASIC METABOLIC PANEL
BUN / CREAT RATIO: 17 (ref 10–24)
BUN: 26 mg/dL (ref 8–27)
CO2: 26 mmol/L (ref 18–29)
CREATININE: 1.51 mg/dL — AB (ref 0.76–1.27)
Calcium: 9 mg/dL (ref 8.6–10.2)
Chloride: 104 mmol/L (ref 96–106)
GFR, EST AFRICAN AMERICAN: 51 mL/min/{1.73_m2} — AB (ref >59)
GFR, EST NON AFRICAN AMERICAN: 44 mL/min/{1.73_m2} — AB (ref >59)
Glucose: 94 mg/dL (ref 65–99)
Potassium: 4.3 mmol/L (ref 3.5–5.2)
SODIUM: 143 mmol/L (ref 134–144)

## 2016-08-24 LAB — BRAIN NATRIURETIC PEPTIDE: BNP: 638 pg/mL — ABNORMAL HIGH (ref 0.0–100.0)

## 2016-08-27 ENCOUNTER — Telehealth: Payer: Self-pay | Admitting: Family Medicine

## 2016-08-27 ENCOUNTER — Other Ambulatory Visit: Payer: Self-pay | Admitting: Family Medicine

## 2016-08-27 NOTE — Telephone Encounter (Signed)
Patient aware of lab results and aware that results a viewable through [place]

## 2016-08-28 DIAGNOSIS — J449 Chronic obstructive pulmonary disease, unspecified: Secondary | ICD-10-CM | POA: Diagnosis not present

## 2016-09-06 ENCOUNTER — Ambulatory Visit (HOSPITAL_COMMUNITY)
Admission: RE | Admit: 2016-09-06 | Discharge: 2016-09-06 | Disposition: A | Discharge status: Home / Self Care | Payer: Medicare Other | Source: Ambulatory Visit | Attending: Cardiology | Admitting: Cardiology

## 2016-09-06 ENCOUNTER — Telehealth (HOSPITAL_COMMUNITY): Payer: Self-pay | Admitting: Pharmacist

## 2016-09-06 VITALS — BP 134/56 | HR 70 | Wt 182.2 lb

## 2016-09-06 DIAGNOSIS — I2582 Chronic total occlusion of coronary artery: Secondary | ICD-10-CM | POA: Diagnosis not present

## 2016-09-06 DIAGNOSIS — Z841 Family history of disorders of kidney and ureter: Secondary | ICD-10-CM | POA: Insufficient documentation

## 2016-09-06 DIAGNOSIS — Z7901 Long term (current) use of anticoagulants: Secondary | ICD-10-CM | POA: Insufficient documentation

## 2016-09-06 DIAGNOSIS — Z87891 Personal history of nicotine dependence: Secondary | ICD-10-CM | POA: Insufficient documentation

## 2016-09-06 DIAGNOSIS — I130 Hypertensive heart and chronic kidney disease with heart failure and stage 1 through stage 4 chronic kidney disease, or unspecified chronic kidney disease: Secondary | ICD-10-CM | POA: Insufficient documentation

## 2016-09-06 DIAGNOSIS — Z800 Family history of malignant neoplasm of digestive organs: Secondary | ICD-10-CM | POA: Diagnosis not present

## 2016-09-06 DIAGNOSIS — Z951 Presence of aortocoronary bypass graft: Secondary | ICD-10-CM | POA: Diagnosis not present

## 2016-09-06 DIAGNOSIS — M109 Gout, unspecified: Secondary | ICD-10-CM | POA: Diagnosis not present

## 2016-09-06 DIAGNOSIS — J449 Chronic obstructive pulmonary disease, unspecified: Secondary | ICD-10-CM | POA: Insufficient documentation

## 2016-09-06 DIAGNOSIS — I482 Chronic atrial fibrillation, unspecified: Secondary | ICD-10-CM

## 2016-09-06 DIAGNOSIS — N183 Chronic kidney disease, stage 3 unspecified: Secondary | ICD-10-CM

## 2016-09-06 DIAGNOSIS — E784 Other hyperlipidemia: Secondary | ICD-10-CM | POA: Diagnosis not present

## 2016-09-06 DIAGNOSIS — Z9889 Other specified postprocedural states: Secondary | ICD-10-CM | POA: Insufficient documentation

## 2016-09-06 DIAGNOSIS — I5022 Chronic systolic (congestive) heart failure: Secondary | ICD-10-CM | POA: Insufficient documentation

## 2016-09-06 DIAGNOSIS — Z9981 Dependence on supplemental oxygen: Secondary | ICD-10-CM | POA: Diagnosis not present

## 2016-09-06 DIAGNOSIS — E785 Hyperlipidemia, unspecified: Secondary | ICD-10-CM | POA: Insufficient documentation

## 2016-09-06 DIAGNOSIS — I255 Ischemic cardiomyopathy: Secondary | ICD-10-CM | POA: Insufficient documentation

## 2016-09-06 DIAGNOSIS — Z8249 Family history of ischemic heart disease and other diseases of the circulatory system: Secondary | ICD-10-CM | POA: Insufficient documentation

## 2016-09-06 DIAGNOSIS — K922 Gastrointestinal hemorrhage, unspecified: Secondary | ICD-10-CM | POA: Insufficient documentation

## 2016-09-06 DIAGNOSIS — I2510 Atherosclerotic heart disease of native coronary artery without angina pectoris: Secondary | ICD-10-CM | POA: Diagnosis not present

## 2016-09-06 DIAGNOSIS — E039 Hypothyroidism, unspecified: Secondary | ICD-10-CM | POA: Insufficient documentation

## 2016-09-06 DIAGNOSIS — E7849 Other hyperlipidemia: Secondary | ICD-10-CM

## 2016-09-06 LAB — CBC
HEMATOCRIT: 37.3 % — AB (ref 39.0–52.0)
Hemoglobin: 12.1 g/dL — ABNORMAL LOW (ref 13.0–17.0)
MCH: 30 pg (ref 26.0–34.0)
MCHC: 32.4 g/dL (ref 30.0–36.0)
MCV: 92.3 fL (ref 78.0–100.0)
Platelets: 237 10*3/uL (ref 150–400)
RBC: 4.04 MIL/uL — ABNORMAL LOW (ref 4.22–5.81)
RDW: 16.2 % — AB (ref 11.5–15.5)
WBC: 10.5 10*3/uL (ref 4.0–10.5)

## 2016-09-06 LAB — BASIC METABOLIC PANEL
ANION GAP: 6 (ref 5–15)
BUN: 28 mg/dL — ABNORMAL HIGH (ref 6–20)
CO2: 27 mmol/L (ref 22–32)
Calcium: 9.4 mg/dL (ref 8.9–10.3)
Chloride: 109 mmol/L (ref 101–111)
Creatinine, Ser: 1.48 mg/dL — ABNORMAL HIGH (ref 0.61–1.24)
GFR, EST AFRICAN AMERICAN: 51 mL/min — AB (ref >60)
GFR, EST NON AFRICAN AMERICAN: 44 mL/min — AB (ref >60)
Glucose, Bld: 98 mg/dL (ref 65–99)
POTASSIUM: 4.4 mmol/L (ref 3.5–5.1)
SODIUM: 142 mmol/L (ref 135–145)

## 2016-09-06 LAB — BRAIN NATRIURETIC PEPTIDE: B Natriuretic Peptide: 669.1 pg/mL — ABNORMAL HIGH (ref 0.0–100.0)

## 2016-09-06 LAB — LIPID PANEL
CHOL/HDL RATIO: 3.6 ratio
CHOLESTEROL: 144 mg/dL (ref 0–200)
HDL: 40 mg/dL — AB (ref >40)
LDL Cholesterol: 87 mg/dL (ref 0–99)
Triglycerides: 84 mg/dL (ref <150)
VLDL: 17 mg/dL (ref 0–40)

## 2016-09-06 MED ORDER — FUROSEMIDE 40 MG PO TABS: 40.0000 mg | ORAL_TABLET | Freq: Every day | ORAL | Status: DC

## 2016-09-06 MED ORDER — SACUBITRIL-VALSARTAN 49-51 MG PO TABS: 1.0000 | ORAL_TABLET | Freq: Two times a day (BID) | ORAL | 3 refills | Status: DC

## 2016-09-06 MED ORDER — SPIRONOLACTONE 25 MG PO TABS: 12.5000 mg | ORAL_TABLET | Freq: Every day | ORAL | 3 refills | Status: DC

## 2016-09-06 NOTE — Patient Instructions (Signed)
Stop Bidil  Stop Potassium  Increase Entresto to 49/51 mg Twice daily   Start Spironolactone 12.5 mg (1/2 tab) daily  Decrease Furosemide to 40 mg daily  Labs today  Labs in 10 days in [place] recommends that you schedule a follow-up appointment in: 1 month

## 2016-09-06 NOTE — Telephone Encounter (Signed)
[name] expressed concern with his [name] [name] copay so have enrolled him in [employer] foundation so that he will have $800 toward his copay costs through 09/05/17. Relayed info to [facility] in [place] who verified $0 copay.  Member ID: [id number] Group ID: [id number] RxBin ID: [id number] PCN: [id number] Eligibility Start Date: 06/08/2016 Eligibility End Date: 09/05/2017   Ruta Hinds. Velva Harman, PharmD, BCPS, CPP Clinical Pharmacist Pager: [phone] Phone: [phone] 09/06/2016 12:16 PM

## 2016-09-06 NOTE — Progress Notes (Signed)
PCP: Dr. Livia Snellen Cardiology: Dr. Tamala Julian HF Cardiology: Dr. Aundra Dubin  75 yo with chronic atrial fibrillation, CAD s/p CABG, and chronic systolic CHF presents for CHF clinic followup.  Patient had CABG in 1998.  He has had chronic atrial fibrillation long-term.  Last echo in 6/17 showed EF 25-30%.  Most recent cardiac cath was in 10/17.  This showed occluded native vessels with occluded SVG-dLAD and SVG-RCA.  SVG-OM and LIMA-LAD were patent (LIMA had stable 85% ostial stenosis).  No interventional options, medically managed.  Cardiac output was preserved, filling pressures were mildly elevated.    Patient was sent to Dr Lovena Le for consideration of ICD.  He was on minimal HF meds, so it was recommended that he start HF meds and repeat an echo prior to ICD placement.    At last appointment, Lasix was increased and he was started on Entresto.  Breathing is better.  Able to do 3 hours of yardwork on Saturday.  No dyspnea walking on flat ground.  No orthopnea/PND.  He is able to play golf using a cart.  No chest pain.  No BRBPR or melena.  No lightheadedness or palpitations.  Weight is down 2 lbs.   ECG: atrial fibrillation, RBBB, LPFB  Labs (10/17): hgb 10.2, K 4.1, creatinine 1.3 Labs (11/17): K 4.3, creatinine 1.5, BNP 638  PMH: 1. Gout 2. Hyperlipidemia 3. Lower GI bleed: Colonic AVMs. 4. Atrial fibrillation: Chronic 5. HTN 6. 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 CAD: CABG x 4 in 1998. - LHC (10/17) with total occlusion SVG-dLAD, total occlusion SVG-RCA (new), patent SVG-OM, 85% ostial LIMA-LAD (unchanged), total occlusion of native LAD, LCx, and RCA.  No interventional option.  11. Chronic systolic CHF: Ischemic cardiomyopathy.   - Echo (6/17) with EF 25-30%, mild MR, mildly dilated RV with mildly decreased systolic function.   - RHC (10/17): mean RA 11, PA 49/14, mean PCWP 17, CI 3.84.   SH: Married, quit smoking 1998,  retired from [employer], lives in [place].    Family History  Problem Relation Age of Onset  . Leukemia Mother   . Kidney disease Mother     kidney removed   . Heart attack Brother   . Cancer Father   . Heart disease Father   . Stomach cancer Sister   . Stomach cancer Sister   . Early death Brother   . Hypertension      family   . Colon cancer Neg Hx    ROS: All systems reviewed and negative except as per HPI.   Current Outpatient Prescriptions  Medication Sig Dispense Refill  . acetaminophen (TYLENOL) 500 MG tablet Take 1,000 mg by mouth every 6 (six) hours as needed for mild pain. For pain.    . budesonide (PULMICORT) 0.25 MG/2ML nebulizer solution Take 0.25 mg by nebulization 2 (two) times daily as needed (shortness of breath).     . carvedilol (COREG) 3.125 MG tablet Take 1 tablet (3.125 mg total) by mouth 2 (two) times daily. 60 tablet 3  . Cholecalciferol (VITAMIN D3) 5000 UNITS CAPS Take 5,000 Units by mouth daily.     Marland Kitchen docusate sodium (COLACE) 100 MG capsule Take 100 mg by mouth 2 (two) times daily as needed for mild constipation.    . ferrous sulfate 325 (65 FE) MG tablet Take 325 mg by mouth 2 (two) times daily.    . furosemide (LASIX) 40 MG tablet Take 1 tablet (  40 mg total) by mouth daily. 30 tablet   . HYDROcodone-acetaminophen (NORCO) 5-325 MG tablet Take 1 tablet by mouth every 6 (six) hours as needed for moderate pain. 30 tablet 0  . ipratropium-albuterol (DUONEB) 0.5-2.5 (3) MG/3ML SOLN Take 3 mLs by nebulization every 6 (six) hours as needed. For shortness of breath.     . levothyroxine (SYNTHROID, LEVOTHROID) 112 MCG tablet TAKE 1 TABLET BY MOUTH  DAILY BEFORE BREAKFAST 30 tablet 0  . loratadine (CLARITIN) 10 MG tablet Take 10 mg by mouth daily after supper.    . lovastatin (MEVACOR) 40 MG tablet Take 1 tablet by mouth at  bedtime (Patient taking differently: Take 40 mg by mouth at  bedtime) 90 tablet 0  . Multiple Vitamins-Minerals (MULTIVITAMINS THER.  W/MINERALS) TABS Take 1 tablet by mouth daily.      . nitroGLYCERIN (NITROSTAT) 0.4 MG SL tablet Place 0.4 mg under the tongue every 5 (five) minutes as needed for chest pain.    . pantoprazole (PROTONIX) 40 MG tablet Take 1 tablet (40 mg total) by mouth 2 (two) times daily. 180 tablet 3  . polyethylene glycol powder (GLYCOLAX/MIRALAX) powder Take 1 capful (17 grams) dissolved in at least 8 ounces water/juice once daily. 527 g 4  . PROAIR HFA 108 (90 Base) MCG/ACT inhaler Use 2 puffs every 6 hours  as needed for shortness of  breath 34 g 3  . sucralfate (CARAFATE) 1 g tablet Take 1 tablet (1 g total) by mouth 4 (four) times daily -  with meals and at bedtime. 360 tablet 5  . warfarin (COUMADIN) 5 MG tablet TAKE 1 TABLET BY MOUTH  EVERY DAY OR AS DIRECTED BY ANTICOAGULATION CLINIC (Patient taking differently: TAKING 5 MG EVERY MONDAY, WEDNESDAY AND FRIDAY AND 2.5 MG ALL OTHER DAYS OF THE WEEK) 90 tablet 1  . sacubitril-valsartan (ENTRESTO) 49-51 MG Take 1 tablet by mouth 2 (two) times daily. 60 tablet 3  . spironolactone (ALDACTONE) 25 MG tablet Take 0.5 tablets (12.5 mg total) by mouth daily. 15 tablet 3   No current facility-administered medications for this encounter.    BP (!) 134/56   Pulse 70   Wt 182 lb 4 oz (82.7 kg)   SpO2 94%   BMI 26.15 kg/m  General: NAD Neck: JVP 7 cm, no thyromegaly or thyroid nodule.  Lungs: Clear to auscultation bilaterally with normal respiratory effort. CV: Nondisplaced PMI.  Heart irregular S1/S2, no S3/S4, 2/6 HSM apex/LLSB.  No edema.  No carotid bruit.  Normal pedal pulses.  Abdomen: Soft, nontender, no hepatosplenomegaly, no distention.  Skin: Intact without lesions or rashes.  Neurologic: Alert and oriented x 3.  Psych: Normal affect. Extremities: No clubbing or cyanosis.  HEENT: Normal.   Assessment/Plan: 1. CAD: s/p CABG 1998.  Occluded native vessels, patent SVG-OM and LIMA-LAD (chronic 85% ostial LIMA stenosis).  No chest pain.  No good  interventional options.  - No ASA given stable CAD and use of warfarin.  - Continue statin, due for lipids today.  2. Chronic systolic CHF: Ischemic cardiomyopathy.  Echo (6/17) with EF 25-30%, mildly dilated RV with decreased RV systolic function.  NYHA class II symptoms, improved.  On exam he looks euvolemic. - Increase Entresto to 49/51 bid.  - Start spironolactone 12.5 daily.  - Stop KCl and Bidil.   - Decrease Lasix to 40 mg daily with increase in Entresto.  - Continue Coreg 3.125 mg bid.  - Repeat echo in 3-4 mos after medication titration.  He  would be a reasonable ICD candidate if EF remains low.  He would not qualify for CRT.  - BMET/BNP today and repeat in 10 days.  3. Atrial fibrillation: Chronic.  Unlikely to be able to cardiovert at this point.  Continue warfarin.  - Check CBC.  4. CKD stage 3: Follow creatinine closely with medication adjustments.   5. COPD: Stable, uses oxygen at night.   Loralie Champagne 09/06/2016

## 2016-09-20 ENCOUNTER — Ambulatory Visit (INDEPENDENT_AMBULATORY_CARE_PROVIDER_SITE_OTHER): Payer: Medicare Other | Admitting: Pharmacist

## 2016-09-20 DIAGNOSIS — I5022 Chronic systolic (congestive) heart failure: Secondary | ICD-10-CM | POA: Diagnosis not present

## 2016-09-20 DIAGNOSIS — I482 Chronic atrial fibrillation, unspecified: Secondary | ICD-10-CM

## 2016-09-20 LAB — COAGUCHEK XS/INR WAIVED
INR: 1.6 — ABNORMAL HIGH (ref 0.9–1.1)
PROTHROMBIN TIME: 19.5 s

## 2016-09-21 DIAGNOSIS — J449 Chronic obstructive pulmonary disease, unspecified: Secondary | ICD-10-CM | POA: Diagnosis not present

## 2016-09-21 LAB — BMP8+EGFR
BUN / CREAT RATIO: 23 (ref 10–24)
BUN: 31 mg/dL — AB (ref 8–27)
CO2: 28 mmol/L (ref 18–29)
CREATININE: 1.33 mg/dL — AB (ref 0.76–1.27)
Calcium: 9.2 mg/dL (ref 8.6–10.2)
Chloride: 105 mmol/L (ref 96–106)
GFR, EST AFRICAN AMERICAN: 59 mL/min/{1.73_m2} — AB (ref >59)
GFR, EST NON AFRICAN AMERICAN: 51 mL/min/{1.73_m2} — AB (ref >59)
Glucose: 107 mg/dL — ABNORMAL HIGH (ref 65–99)
Potassium: 4.1 mmol/L (ref 3.5–5.2)
Sodium: 146 mmol/L — ABNORMAL HIGH (ref 134–144)

## 2016-09-24 ENCOUNTER — Encounter (HOSPITAL_COMMUNITY): Payer: Self-pay | Admitting: *Deleted

## 2016-09-27 DIAGNOSIS — J449 Chronic obstructive pulmonary disease, unspecified: Secondary | ICD-10-CM | POA: Diagnosis not present

## 2016-09-28 ENCOUNTER — Telehealth (HOSPITAL_COMMUNITY): Payer: Self-pay | Admitting: *Deleted

## 2016-09-28 MED ORDER — LOVASTATIN 40 MG PO TABS: 80.0000 mg | ORAL_TABLET | Freq: Every day | ORAL | 3 refills | Status: DC

## 2016-09-28 NOTE — Telephone Encounter (Signed)
-----   Message from Larey Dresser, MD sent at 09/06/2016 10:38 PM EST ----- Would like to see lower LDL.  Increase lovastatin to 80 mg daily with lipid/LFTs in 2 months.

## 2016-09-28 NOTE — Telephone Encounter (Signed)
Notes Recorded by Harvie Junior, CMA on 09/28/2016 at 10:50 AM EST Pt aware of results and medication changes. ------  Notes Recorded by Scarlette Calico, RN on 09/24/2016 at 3:07 PM EST Still unable to reach pt, Left message to call back and letter mailed ------  Notes Recorded by Scarlette Calico, RN on 09/15/2016 at 4:28 PM EST Attempted to call pt, phone was answered and then disconnected ------  Notes Recorded by Harvie Junior, [place] on 09/13/2016 at 4:17 PM EST Left vm for pt to call for lab results. ------  Notes Recorded by Harvie Junior, CMA on 09/07/2016 at 4:22 PM EST Left vm for pt to call for lab results. ------  Notes Recorded by Larey Dresser, MD on 09/06/2016 at 10:38 PM EST Would like to see lower LDL. Increase lovastatin to 80 mg daily with lipid/LFTs in 2 months.    Ref Range & Units 3wk ago (09/06/16) 12yr ago (09/24/15) 56yr ago (12/23/14)   Cholesterol 0 - 200 mg/dL 144      Triglycerides <150 mg/dL 84  101R  137R    HDL >40 mg/dL 40   44R  46R, CM    Total CHOL/HDL Ratio RATIO 3.6  3.0R, CM  3.0R, CM    VLDL 0 - 40 mg/dL 17      LDL Cholesterol 0 - 99 mg/dL 87  70  64

## 2016-10-07 ENCOUNTER — Ambulatory Visit (HOSPITAL_COMMUNITY)
Admission: RE | Admit: 2016-10-07 | Discharge: 2016-10-07 | Disposition: A | Discharge status: Home / Self Care | Payer: Medicare Other | Source: Ambulatory Visit | Attending: Internal Medicine | Admitting: Internal Medicine

## 2016-10-07 ENCOUNTER — Encounter (HOSPITAL_COMMUNITY): Payer: Self-pay

## 2016-10-07 VITALS — BP 152/64 | HR 63 | Wt 181.6 lb

## 2016-10-07 DIAGNOSIS — Z806 Family history of leukemia: Secondary | ICD-10-CM | POA: Diagnosis not present

## 2016-10-07 DIAGNOSIS — Z951 Presence of aortocoronary bypass graft: Secondary | ICD-10-CM | POA: Diagnosis not present

## 2016-10-07 DIAGNOSIS — I2582 Chronic total occlusion of coronary artery: Secondary | ICD-10-CM | POA: Insufficient documentation

## 2016-10-07 DIAGNOSIS — Z800 Family history of malignant neoplasm of digestive organs: Secondary | ICD-10-CM | POA: Diagnosis not present

## 2016-10-07 DIAGNOSIS — Z809 Family history of malignant neoplasm, unspecified: Secondary | ICD-10-CM | POA: Diagnosis not present

## 2016-10-07 DIAGNOSIS — I255 Ischemic cardiomyopathy: Secondary | ICD-10-CM | POA: Insufficient documentation

## 2016-10-07 DIAGNOSIS — Z9981 Dependence on supplemental oxygen: Secondary | ICD-10-CM | POA: Diagnosis not present

## 2016-10-07 DIAGNOSIS — E785 Hyperlipidemia, unspecified: Secondary | ICD-10-CM | POA: Diagnosis not present

## 2016-10-07 DIAGNOSIS — Z87891 Personal history of nicotine dependence: Secondary | ICD-10-CM | POA: Diagnosis not present

## 2016-10-07 DIAGNOSIS — N183 Chronic kidney disease, stage 3 unspecified: Secondary | ICD-10-CM

## 2016-10-07 DIAGNOSIS — Z7901 Long term (current) use of anticoagulants: Secondary | ICD-10-CM | POA: Insufficient documentation

## 2016-10-07 DIAGNOSIS — I482 Chronic atrial fibrillation, unspecified: Secondary | ICD-10-CM

## 2016-10-07 DIAGNOSIS — M109 Gout, unspecified: Secondary | ICD-10-CM | POA: Insufficient documentation

## 2016-10-07 DIAGNOSIS — I10 Essential (primary) hypertension: Secondary | ICD-10-CM | POA: Diagnosis not present

## 2016-10-07 DIAGNOSIS — E039 Hypothyroidism, unspecified: Secondary | ICD-10-CM | POA: Insufficient documentation

## 2016-10-07 DIAGNOSIS — I130 Hypertensive heart and chronic kidney disease with heart failure and stage 1 through stage 4 chronic kidney disease, or unspecified chronic kidney disease: Secondary | ICD-10-CM | POA: Diagnosis not present

## 2016-10-07 DIAGNOSIS — Z8249 Family history of ischemic heart disease and other diseases of the circulatory system: Secondary | ICD-10-CM | POA: Insufficient documentation

## 2016-10-07 DIAGNOSIS — K922 Gastrointestinal hemorrhage, unspecified: Secondary | ICD-10-CM | POA: Insufficient documentation

## 2016-10-07 DIAGNOSIS — J449 Chronic obstructive pulmonary disease, unspecified: Secondary | ICD-10-CM | POA: Diagnosis not present

## 2016-10-07 DIAGNOSIS — I5022 Chronic systolic (congestive) heart failure: Secondary | ICD-10-CM | POA: Insufficient documentation

## 2016-10-07 DIAGNOSIS — Z841 Family history of disorders of kidney and ureter: Secondary | ICD-10-CM | POA: Diagnosis not present

## 2016-10-07 DIAGNOSIS — Z9889 Other specified postprocedural states: Secondary | ICD-10-CM | POA: Diagnosis not present

## 2016-10-07 DIAGNOSIS — I2510 Atherosclerotic heart disease of native coronary artery without angina pectoris: Secondary | ICD-10-CM | POA: Insufficient documentation

## 2016-10-07 MED ORDER — SACUBITRIL-VALSARTAN 97-103 MG PO TABS: 1.0000 | ORAL_TABLET | Freq: Two times a day (BID) | ORAL | 3 refills | Status: DC

## 2016-10-07 MED ORDER — FUROSEMIDE 20 MG PO TABS: 20.0000 mg | ORAL_TABLET | Freq: Every day | ORAL | 6 refills | Status: DC

## 2016-10-07 NOTE — Patient Instructions (Addendum)
DECREASE Lasix to 20 mg once daily. Can half your current 40 mg tablets at home (Take 1/2 tablet once daily). New Rx has been sent in to your pharmacy for 20 mg tablets (Take 1 tablet once daily).  INCREASE Entresto to 97/103 mg tablet twice daily. Can "double up" on your current 49/51 mg tablets (Take 2 tabs twice daily). New Rx has been sent in to your pharmacy for 97/103 mg tabelts (Take 1 tablet twice daily).  Return in 10-14 days for lab work (approximately 10/18/2016).  Follow up with Dr. Aundra Dubin in 4 weeks.  Do the following things EVERYDAY: 1) Weigh yourself in the morning before breakfast. Write it down and keep it in a log. 2) Take your medicines as prescribed 3) Eat low salt foods-Limit salt (sodium) to 2000 mg per day.  4) Stay as active as you can everyday 5) Limit all fluids for the day to less than 2 liters

## 2016-10-07 NOTE — Progress Notes (Signed)
PCP: Dr. Livia Snellen Cardiology: Dr. Tamala Julian HF Cardiology: Dr. Aundra Dubin INR Management: [facility]   75 yo with chronic atrial fibrillation, CAD s/p CABG, and chronic systolic CHF presents for CHF clinic followup.  Patient had CABG in 1998.  He has had chronic atrial fibrillation long-term.  Last echo in 6/17 showed EF 25-30%.  Most recent cardiac cath was in 10/17.  This showed occluded native vessels with occluded SVG-dLAD and SVG-RCA.  SVG-OM and LIMA-LAD were patent (LIMA had stable 85% ostial stenosis).  No interventional options, medically managed.  Cardiac output was preserved, filling pressures were mildly elevated.    Patient was sent to Dr Lovena Le for consideration of ICD.  He was on minimal HF meds, so it was recommended that he start HF meds and repeat an echo prior to ICD placement.    He returns for HF follow up. Last visit spiro added and bidil stopped. Overall feeling ok. Mild dyspnea with steps. Denies PND/Orthopnea. Weight at home 174 pounds. No BRBPR or melena.  No lightheadedness or palpitations. Works out 4-5 days a week. Taking all medications.   Labs (10/17): hgb 10.2, K 4.1, creatinine 1.3 Labs (11/17): K 4.3, creatinine 1.5, BNP 638  PMH: 1. Gout 2. Hyperlipidemia 3. Lower GI bleed: Colonic AVMs. 4. Atrial fibrillation: Chronic 5. HTN 6. 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 CAD: CABG x 4 in 1998. - LHC (10/17) with total occlusion SVG-dLAD, total occlusion SVG-RCA (new), patent SVG-OM, 85% ostial LIMA-LAD (unchanged), total occlusion of native LAD, LCx, and RCA.  No interventional option.  11. Chronic systolic CHF: Ischemic cardiomyopathy.   - Echo (6/17) with EF 25-30%, mild MR, mildly dilated RV with mildly decreased systolic function.   - RHC (10/17): mean RA 11, PA 49/14, mean PCWP 17, CI 3.84.   SH: Married, quit smoking 1998, retired from [employer], lives in  [place].    Family History  Problem Relation Age of Onset  . Leukemia Mother   . Kidney disease Mother     kidney removed   . Heart attack Brother   . Cancer Father   . Heart disease Father   . Stomach cancer Sister   . Stomach cancer Sister   . Early death Brother   . Hypertension      family   . Colon cancer Neg Hx    ROS: All systems reviewed and negative except as per HPI.   Current Outpatient Prescriptions  Medication Sig Dispense Refill  . acetaminophen (TYLENOL) 500 MG tablet Take 1,000 mg by mouth every 6 (six) hours as needed for mild pain. For pain.    . budesonide (PULMICORT) 0.25 MG/2ML nebulizer solution Take 0.25 mg by nebulization 2 (two) times daily as needed (shortness of breath).     . carvedilol (COREG) 3.125 MG tablet Take 1 tablet (3.125 mg total) by mouth 2 (two) times daily. 60 tablet 3  . Cholecalciferol (VITAMIN D3) 5000 UNITS CAPS Take 5,000 Units by mouth daily.     Marland Kitchen docusate sodium (COLACE) 100 MG capsule Take 100 mg by mouth 2 (two) times daily as needed for mild constipation.    . ferrous sulfate 325 (65 FE) MG tablet Take 325 mg by mouth 2 (two) times daily.    . furosemide (LASIX) 40 MG tablet Take 1 tablet (40 mg total) by mouth daily. 30 tablet   . HYDROcodone-acetaminophen (NORCO) 5-325 MG tablet Take 1 tablet by  mouth every 6 (six) hours as needed for moderate pain. 30 tablet 0  . ipratropium-albuterol (DUONEB) 0.5-2.5 (3) MG/3ML SOLN Take 3 mLs by nebulization every 6 (six) hours as needed. For shortness of breath.     . levothyroxine (SYNTHROID, LEVOTHROID) 112 MCG tablet TAKE 1 TABLET BY MOUTH  DAILY BEFORE BREAKFAST 30 tablet 0  . loratadine (CLARITIN) 10 MG tablet Take 10 mg by mouth daily after supper.    . lovastatin (MEVACOR) 40 MG tablet Take 2 tablets (80 mg total) by mouth at bedtime. 180 tablet 3  . Multiple Vitamins-Minerals (MULTIVITAMINS THER. W/MINERALS) TABS Take 1 tablet by mouth daily.      . nitroGLYCERIN (NITROSTAT) 0.4 MG SL  tablet Place 0.4 mg under the tongue every 5 (five) minutes as needed for chest pain.    . pantoprazole (PROTONIX) 40 MG tablet Take 1 tablet (40 mg total) by mouth 2 (two) times daily. 180 tablet 3  . polyethylene glycol powder (GLYCOLAX/MIRALAX) powder Take 1 capful (17 grams) dissolved in at least 8 ounces water/juice once daily. 527 g 4  . PROAIR HFA 108 (90 Base) MCG/ACT inhaler Use 2 puffs every 6 hours  as needed for shortness of  breath 34 g 3  . sacubitril-valsartan (ENTRESTO) 49-51 MG Take 1 tablet by mouth 2 (two) times daily. 60 tablet 3  . spironolactone (ALDACTONE) 25 MG tablet Take 0.5 tablets (12.5 mg total) by mouth daily. 15 tablet 3  . sucralfate (CARAFATE) 1 g tablet Take 1 tablet (1 g total) by mouth 4 (four) times daily -  with meals and at bedtime. 360 tablet 5  . warfarin (COUMADIN) 5 MG tablet TAKE 1 TABLET BY MOUTH  EVERY DAY OR AS DIRECTED BY ANTICOAGULATION CLINIC (Patient taking differently: TAKING 5 MG EVERY MONDAY, WEDNESDAY AND FRIDAY AND 2.5 MG ALL OTHER DAYS OF THE WEEK) 90 tablet 1   No current facility-administered medications for this encounter.    BP (!) 152/64 (BP Location: Right Arm, Patient Position: Sitting, Cuff Size: Normal)   Pulse 63 Comment: irregular  Wt 181 lb 9.6 oz (82.4 kg)   SpO2 93%   BMI 26.06 kg/m  General: NAD Neck: JVP 6-7 cm, no thyromegaly or thyroid nodule.  Lungs: Clear to auscultation bilaterally with normal respiratory effort. CV: Nondisplaced PMI.  Heart irregular S1/S2, no S3/S4, 2/6 HSM apex/LLSB.  No edema.  No carotid bruit.  Normal pedal pulses.  Abdomen: Soft, nontender, no hepatosplenomegaly, no distention.  Skin: Intact without lesions or rashes.  Neurologic: Alert and oriented x 3.  Psych: Normal affect. Extremities: No clubbing or cyanosis.  HEENT: Normal.   Assessment/Plan: 1. CAD: s/p CABG 1998.  Occluded native vessels, patent SVG-OM and LIMA-LAD (chronic 85% ostial LIMA stenosis).  He denies chest pain. No  good interventional options.  - No ASA given stable CAD and use of warfarin.  - Continue statin.   2. Chronic systolic CHF: Ischemic cardiomyopathy.  Echo (6/17) with EF 25-30%, mildly dilated RV with decreased RV systolic function. NYHA II. Volume status stable. Cut back lasix to 20 mg daily.  - Increase Entresto to 97-103 twice a day.  - Continue spironolactone 12.5 daily.  - Continue Coreg 3.125 mg bid.  - Plan to repeat ECHO after HF meds optimized. Can consider ICD at that time. Repeat echo in 2-3 months.    He would not qualify for CRT.  - BMET in 10 days.  today and repeat in 10 days.  3. Atrial fibrillation: Chronic.  Unlikely to be able to cardiovert at this point.  Continue warfarin. Rate Controlled.  No bleeding problems. INR followed at [facility].  4. CKD stage 3:   Reviewed BMET from 09/20/16. Renal function was stable.  5. COPD: Stable, uses oxygen at night. 6. HTN: elevated. As above increase entresto.    Follow up in 4 weeks with Dr Aundra Dubin.   Amy Clegg NP-C     10/07/2016

## 2016-10-11 ENCOUNTER — Telehealth (HOSPITAL_COMMUNITY): Payer: Self-pay | Admitting: *Deleted

## 2016-10-11 NOTE — Telephone Encounter (Signed)
[name], [name] called in asking to clarify the dosage and frequency for Spironolactone and Carvedilol.   I told her he is to take 12.5 mg (0.5 Tab) of Arlyce Harman once a day and Carvedilol 3.125 mg (1 Tab) Two times daily.  She understands with no further questions at this time.

## 2016-10-14 ENCOUNTER — Other Ambulatory Visit: Payer: Self-pay | Admitting: *Deleted

## 2016-10-14 MED ORDER — OSELTAMIVIR PHOSPHATE 75 MG PO CAPS: 75.0000 mg | ORAL_CAPSULE | Freq: Every day | ORAL | 0 refills | Status: DC

## 2016-10-18 ENCOUNTER — Ambulatory Visit (HOSPITAL_COMMUNITY)
Admission: RE | Admit: 2016-10-18 | Discharge: 2016-10-18 | Disposition: A | Discharge status: Home / Self Care | Payer: Medicare Other | Source: Ambulatory Visit | Attending: Cardiology | Admitting: Cardiology

## 2016-10-18 DIAGNOSIS — I5022 Chronic systolic (congestive) heart failure: Secondary | ICD-10-CM | POA: Diagnosis not present

## 2016-10-18 LAB — BASIC METABOLIC PANEL
ANION GAP: 6 (ref 5–15)
BUN: 18 mg/dL (ref 6–20)
CHLORIDE: 108 mmol/L (ref 101–111)
CO2: 30 mmol/L (ref 22–32)
Calcium: 9.1 mg/dL (ref 8.9–10.3)
Creatinine, Ser: 1 mg/dL (ref 0.61–1.24)
Glucose, Bld: 97 mg/dL (ref 65–99)
POTASSIUM: 4.1 mmol/L (ref 3.5–5.1)
SODIUM: 144 mmol/L (ref 135–145)

## 2016-10-21 ENCOUNTER — Encounter: Payer: Self-pay | Admitting: Pharmacist

## 2016-10-22 ENCOUNTER — Ambulatory Visit (INDEPENDENT_AMBULATORY_CARE_PROVIDER_SITE_OTHER): Payer: Medicare Other | Admitting: Pharmacist

## 2016-10-22 DIAGNOSIS — I482 Chronic atrial fibrillation, unspecified: Secondary | ICD-10-CM

## 2016-10-22 LAB — COAGUCHEK XS/INR WAIVED
INR: 3.5 — ABNORMAL HIGH (ref 0.9–1.1)
PROTHROMBIN TIME: 41.7 s

## 2016-10-22 NOTE — Patient Instructions (Signed)
Anticoagulation Dose Instructions as of 10/22/2016      Dorene Grebe Tue Wed Thu Fri Sat   New Dose 5 mg 2.5 mg 5 mg 2.5 mg 5 mg 2.5 mg 5 mg    Description   Goal INR 1.8-2.0 INR was 3.5 today (too thin)  Do not take dose tomorrow (1/20) and take 1/2 tablet on Sunday (1/21) then continue same dose as before.

## 2016-10-28 DIAGNOSIS — J449 Chronic obstructive pulmonary disease, unspecified: Secondary | ICD-10-CM | POA: Diagnosis not present

## 2016-10-30 ENCOUNTER — Other Ambulatory Visit: Payer: Self-pay | Admitting: Family Medicine

## 2016-11-03 ENCOUNTER — Ambulatory Visit (INDEPENDENT_AMBULATORY_CARE_PROVIDER_SITE_OTHER): Payer: Medicare Other | Admitting: Pharmacist

## 2016-11-03 DIAGNOSIS — I482 Chronic atrial fibrillation, unspecified: Secondary | ICD-10-CM

## 2016-11-03 LAB — COAGUCHEK XS/INR WAIVED
INR: 3.1 — ABNORMAL HIGH (ref 0.9–1.1)
PROTHROMBIN TIME: 36.7 s

## 2016-11-05 ENCOUNTER — Encounter (HOSPITAL_COMMUNITY): Payer: Medicare Other

## 2016-11-05 ENCOUNTER — Other Ambulatory Visit: Payer: Self-pay | Admitting: *Deleted

## 2016-11-05 MED ORDER — LEVOTHYROXINE SODIUM 112 MCG PO TABS: ORAL_TABLET | ORAL | 0 refills | Status: DC

## 2016-11-26 ENCOUNTER — Encounter (HOSPITAL_COMMUNITY): Payer: Medicare Other

## 2016-11-28 DIAGNOSIS — J449 Chronic obstructive pulmonary disease, unspecified: Secondary | ICD-10-CM | POA: Diagnosis not present

## 2016-11-30 DIAGNOSIS — J449 Chronic obstructive pulmonary disease, unspecified: Secondary | ICD-10-CM | POA: Diagnosis not present

## 2016-11-30 DIAGNOSIS — J45998 Other asthma: Secondary | ICD-10-CM | POA: Diagnosis not present

## 2016-12-01 ENCOUNTER — Encounter: Payer: Self-pay | Admitting: Family Medicine

## 2016-12-01 ENCOUNTER — Ambulatory Visit (INDEPENDENT_AMBULATORY_CARE_PROVIDER_SITE_OTHER): Payer: Medicare Other | Admitting: Family Medicine

## 2016-12-01 VITALS — Temp 97.3°F | Ht 70.0 in | Wt 174.0 lb

## 2016-12-01 DIAGNOSIS — Z7901 Long term (current) use of anticoagulants: Secondary | ICD-10-CM

## 2016-12-01 DIAGNOSIS — H6503 Acute serous otitis media, bilateral: Secondary | ICD-10-CM

## 2016-12-01 DIAGNOSIS — J449 Chronic obstructive pulmonary disease, unspecified: Secondary | ICD-10-CM

## 2016-12-01 DIAGNOSIS — R160 Hepatomegaly, not elsewhere classified: Secondary | ICD-10-CM

## 2016-12-01 DIAGNOSIS — D500 Iron deficiency anemia secondary to blood loss (chronic): Secondary | ICD-10-CM | POA: Diagnosis not present

## 2016-12-01 DIAGNOSIS — I10 Essential (primary) hypertension: Secondary | ICD-10-CM | POA: Diagnosis not present

## 2016-12-01 DIAGNOSIS — I2510 Atherosclerotic heart disease of native coronary artery without angina pectoris: Secondary | ICD-10-CM | POA: Diagnosis not present

## 2016-12-01 DIAGNOSIS — I482 Chronic atrial fibrillation, unspecified: Secondary | ICD-10-CM

## 2016-12-01 DIAGNOSIS — I5022 Chronic systolic (congestive) heart failure: Secondary | ICD-10-CM | POA: Diagnosis not present

## 2016-12-01 DIAGNOSIS — K31819 Angiodysplasia of stomach and duodenum without bleeding: Secondary | ICD-10-CM

## 2016-12-01 DIAGNOSIS — I25708 Atherosclerosis of coronary artery bypass graft(s), unspecified, with other forms of angina pectoris: Secondary | ICD-10-CM

## 2016-12-01 LAB — COAGUCHEK XS/INR WAIVED
INR: 2 — ABNORMAL HIGH (ref 0.9–1.1)
PROTHROMBIN TIME: 23.7 s

## 2016-12-01 MED ORDER — BETAMETHASONE SOD PHOS & ACET 6 (3-3) MG/ML IJ SUSP: 3.0000 mg | Freq: Once | INTRAMUSCULAR | Status: DC

## 2016-12-01 MED ORDER — AMOXICILLIN-POT CLAVULANATE 875-125 MG PO TABS: 1.0000 | ORAL_TABLET | Freq: Two times a day (BID) | ORAL | 0 refills | Status: DC

## 2016-12-01 NOTE — Progress Notes (Signed)
Subjective:  Patient ID: Rayon Tora Duck, male    DOB: 04-06-1941  Age: 76 y.o. MRN: [id number]  CC: Hypertension (pt here today for routine follow up on his HTN, cholesterol and INR.)   HPI Collan R Reposa presents for Follow-up blood pressure follow-up of hypertension. Patient has no history of headache chest pain or shortness of breath or recent cough. Patient also denies symptoms of TIA such as numbness weakness lateralizing. Patient checks  blood pressure at home and has not had any elevated readings recently. Patient denies side effects from his medication. States taking it regularly. Patient in for follow-up of elevated cholesterol. Doing well without complaints on current medication. Denies side effects of statin including myalgia and arthralgia and nausea. Also in today for liver function testing. Currently no chest pain, shortness of breath or other cardiovascular related symptoms noted.   Patient in for follow-up of atrial fibrillation. Patient denies any recent bouts of chest pain or palpitations. Additionally, patient is taking anticoagulants. Patient denies any recent excessive bleeding episodes including epistaxis, bleeding from the gums, genitalia, rectal bleeding or hematuria. Additionally there has been no excessive bruising. Patient also being followed regularly by cardiology for congestive heart failure. He says he's been having some wheezing recently. He is having dyspnea on exertion for instance if he walks the course or playing golf and will get short of breath. However he is able to stay active.  Of note is that the patient also is having some cold and sinus symptoms and earaches. Onset a few days ago. No fever chills or sweats.   History Arlie has a past medical history of Anemia; Arthritis; Asthma; Atrial fibrillation ([place]); CAD (coronary artery disease); Cholecystitis (11/2013); CKD (chronic kidney disease) (2015); COPD (chronic obstructive pulmonary disease) ([place]);  Esophageal reflux; Gallstones; Gastric antral vascular ectasia (2013); Gout; Hepatomegaly; Hiatal hernia; History of blood transfusion (~ 2012); Hyperlipidemia; Hypothyroidism; Other and unspecified coagulation defects; Personal history of colonic polyps (05/29/2010); and Unspecified essential hypertension.   He has a past surgical history that includes Coronary angioplasty with stent (~ 2008; 07/08/2014); Coronary artery bypass graft (1998); Hernia repair; Umbilical hernia repair (1970's); left and right heart catheterization with coronary/graft angiogram (N/A, 07/09/2014); Colonoscopy (2013); Esophagogastroduodenoscopy (2013); Givens capsule study (2013); cholecystomy tube (12/28/2014 - 01/06/2015); Esophagogastroduodenoscopy (N/A, 12/12/2015); and Cardiac catheterization (N/A, 07/15/2016).   His family history includes Cancer in his father; Early death in his brother; Heart attack in his brother; Heart disease in his father; Kidney disease in his mother; Leukemia in his mother; Stomach cancer in his sister and sister.He reports that he quit smoking about 19 years ago. His smoking use included Cigarettes. He has a 42.00 pack-year smoking history. He has never used smokeless tobacco. He reports that he drinks about 0.6 oz of alcohol per week . He reports that he does not use drugs.    ROS Review of Systems  Constitutional: Negative for activity change, appetite change, chills, diaphoresis, fever and unexpected weight change.  HENT: Positive for ear pain, postnasal drip and sinus pressure. Negative for congestion, ear discharge, hearing loss, nosebleeds, rhinorrhea, sneezing, sore throat and trouble swallowing.   Eyes: Negative for visual disturbance.  Respiratory: Positive for cough, shortness of breath and wheezing. Negative for chest tightness.   Cardiovascular: Negative for chest pain and palpitations.  Gastrointestinal: Negative for abdominal pain, constipation and diarrhea.  Genitourinary: Negative  for dysuria and flank pain.  Musculoskeletal: Negative for arthralgias and joint swelling.  Skin: Negative for rash.  Neurological: Negative  for dizziness and headaches.  Psychiatric/Behavioral: Negative for dysphoric mood and sleep disturbance.    Objective:  Temp 97.3 F (36.3 C) (Oral)   Ht '5\' 10"'$  (1.778 m)   Wt 174 lb (78.9 kg)   BMI 24.97 kg/m   BP Readings from Last 3 Encounters:  10/07/16 (!) 152/64  09/06/16 (!) 134/56  08/16/16 (!) 136/46    Wt Readings from Last 3 Encounters:  12/01/16 174 lb (78.9 kg)  10/07/16 181 lb 9.6 oz (82.4 kg)  09/06/16 182 lb 4 oz (82.7 kg)     Physical Exam  Constitutional: He is oriented to person, place, and time. He appears well-developed and well-nourished. No distress.  HENT:  Head: Normocephalic and atraumatic.  Right Ear: External ear normal.  Left Ear: External ear normal.  Nose: Nose normal.  Mouth/Throat: Oropharynx is clear and moist.  Eyes: Conjunctivae and EOM are normal. Pupils are equal, round, and reactive to light.  Neck: Normal range of motion. Neck supple. No thyromegaly present.  Cardiovascular: Normal rate, regular rhythm and normal heart sounds.   No murmur heard. Pulmonary/Chest: Effort normal. No respiratory distress. He has wheezes. He has no rales.  Abdominal: Soft. Bowel sounds are normal. He exhibits no distension. There is no tenderness.  Musculoskeletal: Normal range of motion. He exhibits no edema.  Lymphadenopathy:    He has no cervical adenopathy.  Neurological: He is alert and oriented to person, place, and time. He has normal reflexes.  Skin: Skin is warm and dry.  Psychiatric: He has a normal mood and affect. His behavior is normal. Judgment and thought content normal.      Assessment & Plan:   [place] was seen today for hypertension.  Diagnoses and all orders for this visit:  Essential hypertension -     CMP14+EGFR  Atrial fibrillation, chronic (HCC) -     CoaguChek XS/INR  Waived -     [id number]  Chronic systolic CHF (congestive heart failure) (HCC) -     CMP14+EGFR -     Brain natriuretic peptide  Chronic anticoagulation -     CMP14+EGFR  Atherosclerosis of native coronary artery of native heart without angina pectoris -     CMP14+EGFR  Hepatomegaly -     CMP14+EGFR  Iron deficiency anemia due to chronic blood loss -     Anemia Profile B -     CMP14+EGFR -     Vitamin B12  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HCC)  GAVE (gastric antral vascular ectasia) -     Vitamin B12  Asthma with COPD (HCC) -     betamethasone acetate-betamethasone sodium phosphate (CELESTONE) injection 3 mg; Inject 0.5 mLs (3 mg total) into the muscle once.  Bilateral acute serous otitis media, recurrence not specified  Other orders -     amoxicillin-clavulanate (AUGMENTIN) 875-125 MG tablet; Take 1 tablet by mouth 2 (two) times daily. Take all of this medication    Results for orders placed or performed in visit on 12/01/16  CoaguChek XS/INR Waived  Result Value Ref Range   INR 2.0 (H) 0.9 - 1.1   Prothrombin Time 23.7 sec  Anemia Profile B  Result Value Ref Range   Total Iron Binding Capacity 282 250 - 450 ug/dL   UIBC 223 111 - 343 ug/dL   Iron 59 38 - 169 ug/dL   Iron Saturation 21 15 - 55 %   Ferritin 156 30 - 400 ng/mL   Vitamin B-12 463 232 - 1,245  pg/mL   Folate >20.0 >3.0 ng/mL   WBC 6.2 3.4 - 10.8 x10E3/uL   RBC 4.03 (L) 4.14 - 5.80 x10E6/uL   Hemoglobin 13.2 13.0 - 17.7 g/dL   Hematocrit 65.6 59.9 - 51.0 %   MCV 96 79 - 97 fL   MCH 32.8 26.6 - 33.0 pg   MCHC 34.2 31.5 - 35.7 g/dL   RDW 43.7 (H) 19.0 - 70.7 %   Platelets 231 150 - 379 x10E3/uL   Neutrophils 60 Not Estab. %   Lymphs 19 Not Estab. %   Monocytes 17 Not Estab. %   Eos 3 Not Estab. %   Basos 1 Not Estab. %   Neutrophils Absolute 3.7 1.4 - 7.0 x10E3/uL   Lymphocytes Absolute 1.2 0.7 - 3.1 x10E3/uL   Monocytes Absolute 1.0 (H) 0.1 - 0.9 x10E3/uL   EOS (ABSOLUTE) 0.2  0.0 - 0.4 x10E3/uL   Basophils Absolute 0.0 0.0 - 0.2 x10E3/uL   Immature Granulocytes 0 Not Estab. %   Immature Grans (Abs) 0.0 0.0 - 0.1 x10E3/uL   Hematology Comments: Note:    Retic Ct Pct 0.6 0.6 - 2.6 %  CMP14+EGFR  Result Value Ref Range   Glucose 87 65 - 99 mg/dL   BUN 18 8 - 27 mg/dL   Creatinine, Ser 2.17 0.76 - 1.27 mg/dL   GFR calc non Af Amer 60 >59 mL/min/1.73   GFR calc Af Amer 70 >59 mL/min/1.73   BUN/Creatinine Ratio 16 10 - 24   Sodium 142 134 - 144 mmol/L   Potassium 4.1 3.5 - 5.2 mmol/L   Chloride 101 96 - 106 mmol/L   CO2 24 18 - 29 mmol/L   Calcium 9.0 8.6 - 10.2 mg/dL   Total Protein 6.4 6.0 - 8.5 g/dL   Albumin 4.1 3.5 - 4.8 g/dL   Globulin, Total 2.3 1.5 - 4.5 g/dL   Albumin/Globulin Ratio 1.8 1.2 - 2.2   Bilirubin Total 0.5 0.0 - 1.2 mg/dL   Alkaline Phosphatase 60 39 - 117 IU/L   AST 22 0 - 40 IU/L   ALT 16 0 - 44 IU/L  Brain natriuretic peptide  Result Value Ref Range   BNP 588.6 (H) 0.0 - 100.0 pg/mL      I have discontinued Mr. Speyer oseltamivir. I am also having him start on amoxicillin-clavulanate. Additionally, I am having him maintain his ipratropium-albuterol, budesonide, multivitamins ther. w/minerals, Vitamin D3, acetaminophen, nitroGLYCERIN, ferrous sulfate, docusate sodium, HYDROcodone-acetaminophen, polyethylene glycol powder, PROAIR HFA, sucralfate, pantoprazole, loratadine, carvedilol, spironolactone, lovastatin, furosemide, sacubitril-valsartan, warfarin, furosemide, and levothyroxine. We will continue to administer betamethasone acetate-betamethasone sodium phosphate.  Allergies as of 12/01/2016   No Known Allergies     Medication List       Accurate as of 12/01/16 11:59 PM. Always use your most recent med list.          acetaminophen 500 MG tablet Commonly known as:  TYLENOL Take 1,000 mg by mouth every 6 (six) hours as needed for mild pain. For pain.   amoxicillin-clavulanate 875-125 MG tablet Commonly known as:   AUGMENTIN Take 1 tablet by mouth 2 (two) times daily. Take all of this medication   budesonide 0.25 MG/2ML nebulizer solution Commonly known as:  PULMICORT Take 0.25 mg by nebulization 2 (two) times daily as needed (shortness of breath).   carvedilol 3.125 MG tablet Commonly known as:  COREG Take 1 tablet (3.125 mg total) by mouth 2 (two) times daily.   docusate sodium 100 MG capsule Commonly  known as:  COLACE Take 100 mg by mouth 2 (two) times daily as needed for mild constipation.   ferrous sulfate 325 (65 FE) MG tablet Take 325 mg by mouth 2 (two) times daily.   furosemide 20 MG tablet Commonly known as:  LASIX Take 1 tablet (20 mg total) by mouth daily.   furosemide 40 MG tablet Commonly known as:  LASIX Take 20-40 mg by mouth daily as needed.   HYDROcodone-acetaminophen 5-325 MG tablet Commonly known as:  NORCO Take 1 tablet by mouth every 6 (six) hours as needed for moderate pain.   ipratropium-albuterol 0.5-2.5 (3) MG/3ML Soln Commonly known as:  DUONEB Take 3 mLs by nebulization every 6 (six) hours as needed. For shortness of breath.   levothyroxine 112 MCG tablet Commonly known as:  SYNTHROID, LEVOTHROID TAKE 1 TABLET BY MOUTH  DAILY BEFORE BREAKFAST   loratadine 10 MG tablet Commonly known as:  CLARITIN Take 10 mg by mouth daily after supper.   lovastatin 40 MG tablet Commonly known as:  MEVACOR Take 2 tablets (80 mg total) by mouth at bedtime.   multivitamins ther. w/minerals Tabs tablet Take 1 tablet by mouth daily.   nitroGLYCERIN 0.4 MG SL tablet Commonly known as:  NITROSTAT Place 0.4 mg under the tongue every 5 (five) minutes as needed for chest pain.   pantoprazole 40 MG tablet Commonly known as:  PROTONIX Take 1 tablet (40 mg total) by mouth 2 (two) times daily.   polyethylene glycol powder powder Commonly known as:  GLYCOLAX/MIRALAX Take 1 capful (17 grams) dissolved in at least 8 ounces water/juice once daily.   PROAIR HFA 108 (90 Base)  MCG/ACT inhaler Generic drug:  albuterol Use 2 puffs every 6 hours  as needed for shortness of  breath   sacubitril-valsartan 97-103 MG Commonly known as:  ENTRESTO Take 1 tablet by mouth 2 (two) times daily.   spironolactone 25 MG tablet Commonly known as:  ALDACTONE Take 0.5 tablets (12.5 mg total) by mouth daily.   sucralfate 1 g tablet Commonly known as:  CARAFATE Take 1 tablet (1 g total) by mouth 4 (four) times daily -  with meals and at bedtime.   Vitamin D3 5000 units Caps Take 5,000 Units by mouth daily.   warfarin 5 MG tablet Commonly known as:  COUMADIN TAKE 1 TABLET BY MOUTH  EVERY DAY OR AS DIRECTED BY ANTICOAGULATION CLINIC      Monitor for swelling. The [place] although great for the breathing could cause some increased swelling. Therefore the dose was reduced by one half. Follow-up sooner if breathing disorder continues.  Follow-up: Return in about 3 months (around 02/28/2017).  Claretta Fraise, M.D.

## 2016-12-02 LAB — ANEMIA PROFILE B
Basophils Absolute: 0 10*3/uL (ref 0.0–0.2)
Basos: 1 %
EOS (ABSOLUTE): 0.2 10*3/uL (ref 0.0–0.4)
Eos: 3 %
FERRITIN: 156 ng/mL (ref 30–400)
Hematocrit: 38.6 % (ref 37.5–51.0)
Hemoglobin: 13.2 g/dL (ref 13.0–17.7)
Immature Grans (Abs): 0 10*3/uL (ref 0.0–0.1)
Immature Granulocytes: 0 %
Iron Saturation: 21 % (ref 15–55)
Iron: 59 ug/dL (ref 38–169)
LYMPHS: 19 %
Lymphocytes Absolute: 1.2 10*3/uL (ref 0.7–3.1)
MCH: 32.8 pg (ref 26.6–33.0)
MCHC: 34.2 g/dL (ref 31.5–35.7)
MCV: 96 fL (ref 79–97)
MONOCYTES: 17 %
Monocytes Absolute: 1 10*3/uL — ABNORMAL HIGH (ref 0.1–0.9)
NEUTROS ABS: 3.7 10*3/uL (ref 1.4–7.0)
Neutrophils: 60 %
PLATELETS: 231 10*3/uL (ref 150–379)
RBC: 4.03 x10E6/uL — ABNORMAL LOW (ref 4.14–5.80)
RDW: 15.6 % — AB (ref 12.3–15.4)
Retic Ct Pct: 0.6 % (ref 0.6–2.6)
Total Iron Binding Capacity: 282 ug/dL (ref 250–450)
UIBC: 223 ug/dL (ref 111–343)
VITAMIN B 12: 463 pg/mL (ref 232–1245)
WBC: 6.2 10*3/uL (ref 3.4–10.8)

## 2016-12-02 LAB — CMP14+EGFR
A/G RATIO: 1.8 (ref 1.2–2.2)
ALBUMIN: 4.1 g/dL (ref 3.5–4.8)
ALK PHOS: 60 IU/L (ref 39–117)
ALT: 16 IU/L (ref 0–44)
AST: 22 IU/L (ref 0–40)
BILIRUBIN TOTAL: 0.5 mg/dL (ref 0.0–1.2)
BUN / CREAT RATIO: 16 (ref 10–24)
BUN: 18 mg/dL (ref 8–27)
CO2: 24 mmol/L (ref 18–29)
Calcium: 9 mg/dL (ref 8.6–10.2)
Chloride: 101 mmol/L (ref 96–106)
Creatinine, Ser: 1.16 mg/dL (ref 0.76–1.27)
GFR calc non Af Amer: 60 mL/min/{1.73_m2} (ref >59)
GFR, EST AFRICAN AMERICAN: 70 mL/min/{1.73_m2} (ref >59)
GLUCOSE: 87 mg/dL (ref 65–99)
Globulin, Total: 2.3 g/dL (ref 1.5–4.5)
POTASSIUM: 4.1 mmol/L (ref 3.5–5.2)
SODIUM: 142 mmol/L (ref 134–144)
Total Protein: 6.4 g/dL (ref 6.0–8.5)

## 2016-12-02 LAB — BRAIN NATRIURETIC PEPTIDE: BNP: 588.6 pg/mL — AB (ref 0.0–100.0)

## 2016-12-06 ENCOUNTER — Encounter (HOSPITAL_COMMUNITY): Payer: Medicare Other

## 2016-12-16 ENCOUNTER — Ambulatory Visit (HOSPITAL_COMMUNITY)
Admission: RE | Admit: 2016-12-16 | Discharge: 2016-12-16 | Disposition: A | Discharge status: Home / Self Care | Payer: Medicare Other | Source: Ambulatory Visit | Attending: Cardiology | Admitting: Cardiology

## 2016-12-16 VITALS — BP 132/56 | HR 50 | Wt 175.0 lb

## 2016-12-16 DIAGNOSIS — I2510 Atherosclerotic heart disease of native coronary artery without angina pectoris: Secondary | ICD-10-CM | POA: Diagnosis not present

## 2016-12-16 DIAGNOSIS — J449 Chronic obstructive pulmonary disease, unspecified: Secondary | ICD-10-CM | POA: Diagnosis not present

## 2016-12-16 DIAGNOSIS — Z9981 Dependence on supplemental oxygen: Secondary | ICD-10-CM | POA: Insufficient documentation

## 2016-12-16 DIAGNOSIS — Z809 Family history of malignant neoplasm, unspecified: Secondary | ICD-10-CM | POA: Diagnosis not present

## 2016-12-16 DIAGNOSIS — I255 Ischemic cardiomyopathy: Secondary | ICD-10-CM | POA: Diagnosis not present

## 2016-12-16 DIAGNOSIS — E785 Hyperlipidemia, unspecified: Secondary | ICD-10-CM | POA: Insufficient documentation

## 2016-12-16 DIAGNOSIS — Z806 Family history of leukemia: Secondary | ICD-10-CM | POA: Diagnosis not present

## 2016-12-16 DIAGNOSIS — Z9889 Other specified postprocedural states: Secondary | ICD-10-CM | POA: Insufficient documentation

## 2016-12-16 DIAGNOSIS — M109 Gout, unspecified: Secondary | ICD-10-CM | POA: Diagnosis not present

## 2016-12-16 DIAGNOSIS — N183 Chronic kidney disease, stage 3 unspecified: Secondary | ICD-10-CM

## 2016-12-16 DIAGNOSIS — I130 Hypertensive heart and chronic kidney disease with heart failure and stage 1 through stage 4 chronic kidney disease, or unspecified chronic kidney disease: Secondary | ICD-10-CM | POA: Insufficient documentation

## 2016-12-16 DIAGNOSIS — Z800 Family history of malignant neoplasm of digestive organs: Secondary | ICD-10-CM | POA: Insufficient documentation

## 2016-12-16 DIAGNOSIS — E039 Hypothyroidism, unspecified: Secondary | ICD-10-CM | POA: Diagnosis not present

## 2016-12-16 DIAGNOSIS — I5022 Chronic systolic (congestive) heart failure: Secondary | ICD-10-CM | POA: Diagnosis not present

## 2016-12-16 DIAGNOSIS — Z841 Family history of disorders of kidney and ureter: Secondary | ICD-10-CM | POA: Diagnosis not present

## 2016-12-16 DIAGNOSIS — Z87891 Personal history of nicotine dependence: Secondary | ICD-10-CM | POA: Insufficient documentation

## 2016-12-16 DIAGNOSIS — Z951 Presence of aortocoronary bypass graft: Secondary | ICD-10-CM | POA: Insufficient documentation

## 2016-12-16 DIAGNOSIS — I2582 Chronic total occlusion of coronary artery: Secondary | ICD-10-CM | POA: Insufficient documentation

## 2016-12-16 DIAGNOSIS — I482 Chronic atrial fibrillation, unspecified: Secondary | ICD-10-CM

## 2016-12-16 DIAGNOSIS — Z7901 Long term (current) use of anticoagulants: Secondary | ICD-10-CM | POA: Insufficient documentation

## 2016-12-16 DIAGNOSIS — Z8249 Family history of ischemic heart disease and other diseases of the circulatory system: Secondary | ICD-10-CM | POA: Insufficient documentation

## 2016-12-16 LAB — BASIC METABOLIC PANEL
ANION GAP: 6 (ref 5–15)
BUN: 17 mg/dL (ref 6–20)
CALCIUM: 8.9 mg/dL (ref 8.9–10.3)
CO2: 28 mmol/L (ref 22–32)
Chloride: 107 mmol/L (ref 101–111)
Creatinine, Ser: 1.09 mg/dL (ref 0.61–1.24)
GFR calc non Af Amer: >60 mL/min (ref >60)
Glucose, Bld: 96 mg/dL (ref 65–99)
Potassium: 4.3 mmol/L (ref 3.5–5.1)
Sodium: 141 mmol/L (ref 135–145)

## 2016-12-16 LAB — BRAIN NATRIURETIC PEPTIDE: B Natriuretic Peptide: 947 pg/mL — ABNORMAL HIGH (ref 0.0–100.0)

## 2016-12-16 NOTE — Patient Instructions (Signed)
Labs today  Your physician has requested that you have an echocardiogram. Echocardiography is a painless test that uses sound waves to create images of your heart. It provides your doctor with information about the size and shape of your heart and how well your heart's chambers and valves are working. This procedure takes approximately one hour. There are no restrictions for this procedure.  IN 3 MONTHS  Your physician recommends that you schedule a follow-up appointment in: 1 month

## 2016-12-17 NOTE — Progress Notes (Signed)
PCP: Dr. Livia Snellen Cardiology: Dr. Tamala Julian HF Cardiology: Dr. Aundra Dubin  77 yo with chronic atrial fibrillation, CAD s/p CABG, and chronic systolic CHF presents for CHF clinic followup.  Patient had CABG in 1998.  He has had chronic atrial fibrillation long-term.  Last echo in 6/17 showed EF 25-30%.  Most recent cardiac cath was in 10/17.  This showed occluded native vessels with occluded SVG-dLAD and SVG-RCA.  SVG-OM and LIMA-LAD were patent (LIMA had stable 85% ostial stenosis).  No interventional options, medically managed.  Cardiac output was preserved, filling pressures were mildly elevated.    Patient was sent to Dr Lovena Le for consideration of ICD.  He was on minimal HF meds, so it was recommended that he start HF meds and repeat an echo prior to ICD placement.    At last appointment, I titrated up his Entresto.  He had been doing fairly well until the last few weeks.  He initially had an episode of what sounds like could have been the flu.  He then developed what was probably a viral gastroenteritis with profuse diarrhea for about a week.  This finally resolved earlier this week.  Since then, he has felt weak and fatigued.  Weight is down 6 lbs.  No chest pain.  No lightheadednes or palpitations.  Mild dyspnea walking in office today.  No problems walking around the house, has been out very little in the last couple of weeks because of gastroenteritis.    ECG (reviewed personally): atrial fibrillation at 44, RBBB, LAFB.   Labs (10/17): hgb 10.2, K 4.1, creatinine 1.3 Labs (11/17): K 4.3, creatinine 1.5, BNP 638, LDL 87 Labs (2/18): K 4.1, creatinine 1.16, BNP 589  PMH: 1. Gout 2. Hyperlipidemia 3. Lower GI bleed: Colonic AVMs. 4. Atrial fibrillation: Chronic 5. HTN 6. 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 CAD: CABG x 4 in 1998. - LHC (10/17) with total occlusion SVG-dLAD, total occlusion SVG-RCA (new), patent SVG-OM,  85% ostial LIMA-LAD (unchanged), total occlusion of native LAD, LCx, and RCA.  No interventional option.  11. Chronic systolic CHF: Ischemic cardiomyopathy.   - Echo (6/17) with EF 25-30%, mild MR, mildly dilated RV with mildly decreased systolic function.   - RHC (10/17): mean RA 11, PA 49/14, mean PCWP 17, CI 3.84.   SH: Married, quit smoking 1998, retired from [employer], lives in [place].    Family History  Problem Relation Age of Onset  . Leukemia Mother   . Kidney disease Mother     kidney removed   . Heart attack Brother   . Cancer Father   . Heart disease Father   . Stomach cancer Sister   . Stomach cancer Sister   . Early death Brother   . Hypertension      family   . Colon cancer Neg Hx    ROS: All systems reviewed and negative except as per HPI.   Current Outpatient Prescriptions  Medication Sig Dispense Refill  . acetaminophen (TYLENOL) 500 MG tablet Take 1,000 mg by mouth every 6 (six) hours as needed for mild pain. For pain.    Marland Kitchen amoxicillin-clavulanate (AUGMENTIN) 875-125 MG tablet Take 1 tablet by mouth 2 (two) times daily. Take all of this medication 20 tablet 0  . budesonide (PULMICORT) 0.25 MG/2ML nebulizer solution Take 0.25 mg by nebulization 2 (two) times daily as needed (shortness of breath).     . carvedilol (COREG) 3.125 MG tablet Take  1 tablet (3.125 mg total) by mouth 2 (two) times daily. 60 tablet 3  . Cholecalciferol (VITAMIN D3) 5000 UNITS CAPS Take 5,000 Units by mouth daily.     Marland Kitchen docusate sodium (COLACE) 100 MG capsule Take 100 mg by mouth 2 (two) times daily as needed for mild constipation.    . ferrous sulfate 325 (65 FE) MG tablet Take 325 mg by mouth 2 (two) times daily.    . furosemide (LASIX) 40 MG tablet Take 20 mg by mouth daily.    Marland Kitchen HYDROcodone-acetaminophen (NORCO) 5-325 MG tablet Take 1 tablet by mouth every 6 (six) hours as needed for moderate pain. 30 tablet 0  . ipratropium-albuterol (DUONEB) 0.5-2.5 (3) MG/3ML SOLN Take 3  mLs by nebulization every 6 (six) hours as needed. For shortness of breath.     . levothyroxine (SYNTHROID, LEVOTHROID) 112 MCG tablet TAKE 1 TABLET BY MOUTH  DAILY BEFORE BREAKFAST 90 tablet 0  . loratadine (CLARITIN) 10 MG tablet Take 10 mg by mouth daily after supper.    . lovastatin (MEVACOR) 40 MG tablet Take 2 tablets (80 mg total) by mouth at bedtime. 180 tablet 3  . Multiple Vitamins-Minerals (MULTIVITAMINS THER. W/MINERALS) TABS Take 1 tablet by mouth daily.      . nitroGLYCERIN (NITROSTAT) 0.4 MG SL tablet Place 0.4 mg under the tongue every 5 (five) minutes as needed for chest pain.    . pantoprazole (PROTONIX) 40 MG tablet Take 1 tablet (40 mg total) by mouth 2 (two) times daily. 180 tablet 3  . polyethylene glycol powder (GLYCOLAX/MIRALAX) powder Take 1 capful (17 grams) dissolved in at least 8 ounces water/juice once daily. 527 g 4  . PROAIR HFA 108 (90 Base) MCG/ACT inhaler Use 2 puffs every 6 hours  as needed for shortness of  breath 34 g 3  . sacubitril-valsartan (ENTRESTO) 97-103 MG Take 1 tablet by mouth 2 (two) times daily. 60 tablet 3  . spironolactone (ALDACTONE) 25 MG tablet Take 0.5 tablets (12.5 mg total) by mouth daily. 15 tablet 3  . sucralfate (CARAFATE) 1 g tablet Take 1 tablet (1 g total) by mouth 4 (four) times daily -  with meals and at bedtime. 360 tablet 5  . warfarin (COUMADIN) 5 MG tablet TAKE 1 TABLET BY MOUTH  EVERY DAY OR AS DIRECTED BY ANTICOAGULATION CLINIC 90 tablet 0   Current Facility-Administered Medications  Medication Dose Route Frequency Provider Last Rate Last Dose  . betamethasone acetate-betamethasone sodium phosphate (CELESTONE) injection 3 mg  3 mg Intramuscular Once Claretta Fraise, MD       BP (!) 132/56   Pulse 71   Wt 175 lb (79.4 kg)   SpO2 94%   BMI 25.11 kg/m  General: NAD Neck: JVP 7 cm, no thyromegaly or thyroid nodule.  Lungs: Clear to auscultation bilaterally with normal respiratory effort. CV: Nondisplaced PMI.  Heart mildly  brady, irregular S1/S2, no S3/S4, 2/6 HSM LLSB.  No edema.  No carotid bruit.  Normal pedal pulses.  Abdomen: Soft, nontender, no hepatosplenomegaly, no distention.  Skin: Intact without lesions or rashes.  Neurologic: Alert and oriented x 3.  Psych: Normal affect. Extremities: No clubbing or cyanosis.  HEENT: Normal.   Assessment/Plan: 1. CAD: s/p CABG 1998.  Occluded native vessels, patent SVG-OM and LIMA-LAD (chronic 85% ostial LIMA stenosis).  No chest pain.  No good interventional options.  - No ASA given stable CAD and use of warfarin.  - Continue statin, LDL acceptable.  If it rises any more,  will need more potent statin.  2. Chronic systolic CHF: Ischemic cardiomyopathy.  Echo (6/17) with EF 25-30%, mildly dilated RV with decreased RV systolic function.  NYHA class II symptoms.  On exam he looks euvolemic. He feels weak and fatigued, but think this is coming from flu then gastroenteritis.  - I am not going to change any meds today, need to get BMET to make sure he is not dehydrated with elevated creatinine.  - Continue Entresto 97/103 bid.  - Continue spironolactone 12.5 daily, increase at next appt if K and cr stable.  - Continue Lasix 20 mg daily as long as creatinine stable.  - Continue Coreg 3.125 mg bid.  Bradycardic so doubt we will have any room to increase.  - Repeat echo in 3 mos after medication titration.  He would be a reasonable ICD candidate if EF remains low.  He would not qualify for CRT.  3. Atrial fibrillation: Chronic.  Unlikely to be able to cardiovert at this point.  Continue warfarin.  4. CKD stage 3: Follow creatinine closely with medication adjustments.   5. COPD: Stable, uses oxygen at night.   Loralie Champagne 12/17/2016

## 2016-12-22 ENCOUNTER — Other Ambulatory Visit (HOSPITAL_COMMUNITY): Payer: Self-pay | Admitting: Cardiology

## 2016-12-24 DIAGNOSIS — J449 Chronic obstructive pulmonary disease, unspecified: Secondary | ICD-10-CM | POA: Diagnosis not present

## 2016-12-26 DIAGNOSIS — J449 Chronic obstructive pulmonary disease, unspecified: Secondary | ICD-10-CM | POA: Diagnosis not present

## 2017-01-05 ENCOUNTER — Ambulatory Visit (INDEPENDENT_AMBULATORY_CARE_PROVIDER_SITE_OTHER): Payer: Medicare Other | Admitting: Pharmacist

## 2017-01-05 DIAGNOSIS — I482 Chronic atrial fibrillation, unspecified: Secondary | ICD-10-CM

## 2017-01-05 LAB — COAGUCHEK XS/INR WAIVED
INR: 2.1 — ABNORMAL HIGH (ref 0.9–1.1)
Prothrombin Time: 24.9 s

## 2017-01-07 ENCOUNTER — Other Ambulatory Visit (HOSPITAL_COMMUNITY): Payer: Self-pay | Admitting: Cardiology

## 2017-01-08 ENCOUNTER — Other Ambulatory Visit: Payer: Self-pay | Admitting: Family Medicine

## 2017-01-10 NOTE — Telephone Encounter (Signed)
Please forward this to the provider that has seen the patient in the past

## 2017-01-26 DIAGNOSIS — J449 Chronic obstructive pulmonary disease, unspecified: Secondary | ICD-10-CM | POA: Diagnosis not present

## 2017-01-27 ENCOUNTER — Ambulatory Visit (HOSPITAL_COMMUNITY)
Admission: RE | Admit: 2017-01-27 | Discharge: 2017-01-27 | Disposition: A | Discharge status: Home / Self Care | Payer: Medicare Other | Source: Ambulatory Visit | Attending: Cardiology | Admitting: Cardiology

## 2017-01-27 ENCOUNTER — Encounter (HOSPITAL_COMMUNITY): Payer: Self-pay

## 2017-01-27 VITALS — BP 134/64 | HR 58 | Wt 178.8 lb

## 2017-01-27 DIAGNOSIS — I255 Ischemic cardiomyopathy: Secondary | ICD-10-CM | POA: Insufficient documentation

## 2017-01-27 DIAGNOSIS — Z951 Presence of aortocoronary bypass graft: Secondary | ICD-10-CM | POA: Insufficient documentation

## 2017-01-27 DIAGNOSIS — J449 Chronic obstructive pulmonary disease, unspecified: Secondary | ICD-10-CM | POA: Insufficient documentation

## 2017-01-27 DIAGNOSIS — E785 Hyperlipidemia, unspecified: Secondary | ICD-10-CM | POA: Diagnosis not present

## 2017-01-27 DIAGNOSIS — Z8249 Family history of ischemic heart disease and other diseases of the circulatory system: Secondary | ICD-10-CM | POA: Diagnosis not present

## 2017-01-27 DIAGNOSIS — Z9981 Dependence on supplemental oxygen: Secondary | ICD-10-CM | POA: Insufficient documentation

## 2017-01-27 DIAGNOSIS — N183 Chronic kidney disease, stage 3 unspecified: Secondary | ICD-10-CM

## 2017-01-27 DIAGNOSIS — I482 Chronic atrial fibrillation, unspecified: Secondary | ICD-10-CM

## 2017-01-27 DIAGNOSIS — E039 Hypothyroidism, unspecified: Secondary | ICD-10-CM | POA: Diagnosis not present

## 2017-01-27 DIAGNOSIS — Z7901 Long term (current) use of anticoagulants: Secondary | ICD-10-CM | POA: Insufficient documentation

## 2017-01-27 DIAGNOSIS — Z841 Family history of disorders of kidney and ureter: Secondary | ICD-10-CM | POA: Diagnosis not present

## 2017-01-27 DIAGNOSIS — Z800 Family history of malignant neoplasm of digestive organs: Secondary | ICD-10-CM | POA: Diagnosis not present

## 2017-01-27 DIAGNOSIS — M109 Gout, unspecified: Secondary | ICD-10-CM | POA: Insufficient documentation

## 2017-01-27 DIAGNOSIS — I2510 Atherosclerotic heart disease of native coronary artery without angina pectoris: Secondary | ICD-10-CM | POA: Insufficient documentation

## 2017-01-27 DIAGNOSIS — Z87891 Personal history of nicotine dependence: Secondary | ICD-10-CM | POA: Insufficient documentation

## 2017-01-27 DIAGNOSIS — I130 Hypertensive heart and chronic kidney disease with heart failure and stage 1 through stage 4 chronic kidney disease, or unspecified chronic kidney disease: Secondary | ICD-10-CM | POA: Insufficient documentation

## 2017-01-27 DIAGNOSIS — R001 Bradycardia, unspecified: Secondary | ICD-10-CM | POA: Diagnosis not present

## 2017-01-27 DIAGNOSIS — Z79899 Other long term (current) drug therapy: Secondary | ICD-10-CM | POA: Insufficient documentation

## 2017-01-27 DIAGNOSIS — Z806 Family history of leukemia: Secondary | ICD-10-CM | POA: Insufficient documentation

## 2017-01-27 DIAGNOSIS — I5022 Chronic systolic (congestive) heart failure: Secondary | ICD-10-CM

## 2017-01-27 LAB — BASIC METABOLIC PANEL
Anion gap: 7 (ref 5–15)
BUN: 17 mg/dL (ref 6–20)
CALCIUM: 9.2 mg/dL (ref 8.9–10.3)
CO2: 26 mmol/L (ref 22–32)
CREATININE: 1.11 mg/dL (ref 0.61–1.24)
Chloride: 108 mmol/L (ref 101–111)
GLUCOSE: 91 mg/dL (ref 65–99)
Potassium: 4.1 mmol/L (ref 3.5–5.1)
Sodium: 141 mmol/L (ref 135–145)

## 2017-01-27 LAB — CBC
HCT: 36.6 % — ABNORMAL LOW (ref 39.0–52.0)
Hemoglobin: 11.9 g/dL — ABNORMAL LOW (ref 13.0–17.0)
MCH: 32.5 pg (ref 26.0–34.0)
MCHC: 32.5 g/dL (ref 30.0–36.0)
MCV: 100 fL (ref 78.0–100.0)
PLATELETS: 232 10*3/uL (ref 150–400)
RBC: 3.66 MIL/uL — AB (ref 4.22–5.81)
RDW: 13.3 % (ref 11.5–15.5)
WBC: 8.4 10*3/uL (ref 4.0–10.5)

## 2017-01-27 MED ORDER — FUROSEMIDE 40 MG PO TABS: 20.0000 mg | ORAL_TABLET | ORAL | Status: DC | PRN

## 2017-01-27 NOTE — Progress Notes (Signed)
PCP: Dr. Livia Snellen Cardiology: Dr. Tamala Julian HF Cardiology: Dr. Aundra Dubin  76 yo with chronic atrial fibrillation, CAD s/p CABG, and chronic systolic CHF presents for CHF clinic followup.  Patient had CABG in 1998.  He has had chronic atrial fibrillation long-term.  Last echo in 6/17 showed EF 25-30%.  Most recent cardiac cath was in 10/17.  This showed occluded native vessels with occluded SVG-dLAD and SVG-RCA.  SVG-OM and LIMA-LAD were patent (LIMA had stable 85% ostial stenosis).  No interventional options, medically managed.  Cardiac output was preserved, filling pressures were mildly elevated.    Patient was sent to Dr Lovena Le for consideration of ICD.  He was on minimal HF meds, so it was recommended that he start HF meds and repeat an echo prior to ICD placement.    He is generally doing ok.  He does have occasional dizzy/lightheaded spells, usually if he stands up fast.  He is, of note, bradycardic.  He goes to the [facility] 4-5 times a week.  He walks 3 miles on the treadmill and uses the weights with no exertional dyspnea.  Overall, breathing is better on Entresto.  Weight is up 3 lbs compared to last appointment, but was in the midst of acute gastroenteritis also at last appointment.   Labs (10/17): hgb 10.2, K 4.1, creatinine 1.3 Labs (11/17): K 4.3, creatinine 1.5, BNP 638, LDL 87 Labs (2/18): K 4.1, creatinine 1.16, BNP 589 Labs (3/18): BNP 947, K 4.3, creatinine 1.09  PMH: 1. Gout 2. Hyperlipidemia 3. Lower GI bleed: Colonic AVMs. 4. Atrial fibrillation: Chronic 5. HTN 6. 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 CAD: CABG x 4 in 1998. - LHC (10/17) with total occlusion SVG-dLAD, total occlusion SVG-RCA (new), patent SVG-OM, 85% ostial LIMA-LAD (unchanged), total occlusion of native LAD, LCx, and RCA.  No interventional option.  11. Chronic systolic CHF: Ischemic cardiomyopathy.   - Echo (6/17) with EF 25-30%, mild MR,  mildly dilated RV with mildly decreased systolic function.   - RHC (10/17): mean RA 11, PA 49/14, mean PCWP 17, CI 3.84.  12. Sinus bradycardia  SH: Married, quit smoking 1998, retired from [employer], lives in [place].    Family History  Problem Relation Age of Onset  . Leukemia Mother   . Kidney disease Mother     kidney removed   . Heart attack Brother   . Cancer Father   . Heart disease Father   . Stomach cancer Sister   . Stomach cancer Sister   . Early death Brother   . Hypertension      family   . Colon cancer Neg Hx    ROS: All systems reviewed and negative except as per HPI.   Current Outpatient Prescriptions  Medication Sig Dispense Refill  . acetaminophen (TYLENOL) 500 MG tablet Take 1,000 mg by mouth every 6 (six) hours as needed for mild pain. For pain.    . budesonide (PULMICORT) 0.25 MG/2ML nebulizer solution Take 0.25 mg by nebulization 2 (two) times daily as needed (shortness of breath).     . carvedilol (COREG) 3.125 MG tablet TAKE 1 TABLET (3.125 MG TOTAL) BY MOUTH 2 (TWO) TIMES DAILY. 60 tablet 3  . Cholecalciferol (VITAMIN D3) 5000 UNITS CAPS Take 5,000 Units by mouth daily.     Marland Kitchen docusate sodium (COLACE) 100 MG capsule Take 100 mg by mouth 2 (two) times daily as needed for mild constipation.    Marland Kitchen  ferrous sulfate 325 (65 FE) MG tablet Take 325 mg by mouth 2 (two) times daily.    . furosemide (LASIX) 40 MG tablet Take 0.5 tablets (20 mg total) by mouth as needed (if weight up 3 lbs overnight or 4 lb in a week take 20 mg for 3 days). 30 tablet   . ipratropium-albuterol (DUONEB) 0.5-2.5 (3) MG/3ML SOLN Take 3 mLs by nebulization every 6 (six) hours as needed. For shortness of breath.     . levothyroxine (SYNTHROID, LEVOTHROID) 112 MCG tablet TAKE 1 TABLET BY MOUTH  DAILY BEFORE BREAKFAST 90 tablet 0  . loratadine (CLARITIN) 10 MG tablet Take 10 mg by mouth daily after supper.    . lovastatin (MEVACOR) 40 MG tablet Take 2 tablets (80 mg total) by mouth  at bedtime. 180 tablet 3  . Multiple Vitamins-Minerals (MULTIVITAMINS THER. W/MINERALS) TABS Take 1 tablet by mouth daily.      . pantoprazole (PROTONIX) 40 MG tablet Take 1 tablet (40 mg total) by mouth 2 (two) times daily. 180 tablet 3  . polyethylene glycol powder (GLYCOLAX/MIRALAX) powder Take 1 capful (17 grams) dissolved in at least 8 ounces water/juice once daily. 527 g 4  . PROAIR HFA 108 (90 Base) MCG/ACT inhaler Use 2 puffs every 6 hours  as needed for shortness of  breath 34 g 3  . sacubitril-valsartan (ENTRESTO) 97-103 MG Take 1 tablet by mouth 2 (two) times daily. 60 tablet 3  . spironolactone (ALDACTONE) 25 MG tablet TAKE 0.5 TABLETS (12.5 MG TOTAL) BY MOUTH DAILY. 15 tablet 3  . sucralfate (CARAFATE) 1 g tablet Take 1 tablet (1 g total) by mouth 4 (four) times daily -  with meals and at bedtime. 360 tablet 5  . warfarin (COUMADIN) 5 MG tablet TAKE 1 TABLET BY MOUTH  EVERY DAY OR AS DIRECTED BY ANTICOAGULATION CLINIC 90 tablet 0  . nitroGLYCERIN (NITROSTAT) 0.4 MG SL tablet Place 0.4 mg under the tongue every 5 (five) minutes as needed for chest pain.     No current facility-administered medications for this encounter.    BP 134/64   Pulse (!) 58   Wt 178 lb 12 oz (81.1 kg)   SpO2 98%   BMI 25.65 kg/m  General: NAD Neck: JVP not elevated, no thyromegaly or thyroid nodule.  Lungs: CTAB CV: Nondisplaced PMI.  Heart mildly brady, irregular S1/S2, no S3/S4, 1/6 HSM LLSB.  No edema.  No carotid bruit.  Normal pedal pulses.  Abdomen: Soft, nontender, no hepatosplenomegaly, no distention.  Skin: Intact without lesions or rashes.  Neurologic: Alert and oriented x 3.  Psych: Normal affect. Extremities: No clubbing or cyanosis.  HEENT: Normal.   Assessment/Plan: 1. CAD: s/p CABG 1998.  Occluded native vessels, patent SVG-OM and LIMA-LAD (chronic 85% ostial LIMA stenosis).  No chest pain.  No good interventional options.  - No ASA given stable CAD and use of warfarin.  - Continue  statin, LDL acceptable at last check.  If it rises any more, will need more potent statin.  2. Chronic systolic CHF: Ischemic cardiomyopathy.  Echo (6/17) with EF 25-30%, mildly dilated RV with decreased RV systolic function.  NYHA class II symptoms.  On exam he looks euvolemic. Occasional dizzy spells.  BP is ok today, I wonder if it could be related to his bradycardia.  - I will not stop Coreg, but will get 48 hr holter to see how low HR gets.   - Continue Entresto 97/103 bid.  - Continue spironolactone 12.5 daily,  hold off on increase with occasional dizzy spells.  - I think he can stop Lasix => mild dehydration with increase in Entresto could be causing the occasional dizzy spells.  He will take Lasix only prn, take 20 mg daily x 3 days with weight gain of 3 lbs in a day or 4 lbs in a week.  - Repeat echo in 6/18.  He would be a reasonable ICD candidate if EF remains low (I think that he is now on the maximal medical therapy that he will tolerate).  He would not qualify for CRT.  3. Atrial fibrillation: Chronic.  Unlikely to be able to cardiovert at this point.  Continue warfarin. CBC today.  4. CKD stage 3: BMET today.    5. COPD: Stable, uses oxygen at night.   Loralie Champagne 01/27/2017

## 2017-01-27 NOTE — Patient Instructions (Signed)
Change Furosemide (Lasix) to 20 mg (1/2 tab) AS NEEDED.  If weight is up 3 lbs overnight or 4 lbs in a week take 20 mg daily for 3 days   Labs today  Your physician has recommended that you wear a holter monitor. Holter monitors are medical devices that record the heart's electrical activity. Doctors most often use these monitors to diagnose arrhythmias. Arrhythmias are problems with the speed or rhythm of the heartbeat. The monitor is a small, portable device. You can wear one while you do your normal daily activities. This is usually used to diagnose what is causing palpitations/syncope (passing out).  Your physician recommends that you schedule a follow-up appointment in: 2 months

## 2017-02-03 ENCOUNTER — Ambulatory Visit (INDEPENDENT_AMBULATORY_CARE_PROVIDER_SITE_OTHER): Payer: Medicare Other

## 2017-02-03 DIAGNOSIS — R001 Bradycardia, unspecified: Secondary | ICD-10-CM

## 2017-02-09 ENCOUNTER — Ambulatory Visit (INDEPENDENT_AMBULATORY_CARE_PROVIDER_SITE_OTHER): Payer: Medicare Other | Admitting: Pharmacist

## 2017-02-09 DIAGNOSIS — I482 Chronic atrial fibrillation, unspecified: Secondary | ICD-10-CM

## 2017-02-09 LAB — COAGUCHEK XS/INR WAIVED
INR: 1.5 — ABNORMAL HIGH (ref 0.9–1.1)
Prothrombin Time: 17.7 s

## 2017-02-12 ENCOUNTER — Other Ambulatory Visit (HOSPITAL_COMMUNITY): Payer: Self-pay | Admitting: Adult Health

## 2017-02-14 ENCOUNTER — Telehealth (HOSPITAL_COMMUNITY): Payer: Self-pay | Admitting: *Deleted

## 2017-02-14 DIAGNOSIS — R001 Bradycardia, unspecified: Secondary | ICD-10-CM

## 2017-02-14 DIAGNOSIS — R0683 Snoring: Secondary | ICD-10-CM

## 2017-02-14 NOTE — Telephone Encounter (Signed)
-----   Message from Larey Dresser, MD sent at 02/14/2017 12:32 AM EDT ----- With nocturnal severe bradycardia, he would likely benefit from a sleep study (would recommend).  Given low HR, very low at times with pauses > 3 sec, would stop Coreg for now.  Frequent ventricular ectopy, may end up needing ICD.

## 2017-02-14 NOTE — Telephone Encounter (Signed)
Notes recorded by Scarlette Calico, RN on 02/14/2017 at 4:19 PM EDT Pt aware, agreeable and verbalizes understanding. He will stop carvedilol, order placed for sleep study.

## 2017-02-25 DIAGNOSIS — J449 Chronic obstructive pulmonary disease, unspecified: Secondary | ICD-10-CM | POA: Diagnosis not present

## 2017-03-02 ENCOUNTER — Ambulatory Visit (INDEPENDENT_AMBULATORY_CARE_PROVIDER_SITE_OTHER): Payer: Medicare Other | Admitting: Family Medicine

## 2017-03-02 ENCOUNTER — Encounter: Payer: Self-pay | Admitting: Family Medicine

## 2017-03-02 VITALS — BP 153/61 | HR 64 | Temp 97.5°F | Ht 70.0 in | Wt 175.1 lb

## 2017-03-02 DIAGNOSIS — I482 Chronic atrial fibrillation, unspecified: Secondary | ICD-10-CM

## 2017-03-02 DIAGNOSIS — R001 Bradycardia, unspecified: Secondary | ICD-10-CM

## 2017-03-02 DIAGNOSIS — E782 Mixed hyperlipidemia: Secondary | ICD-10-CM

## 2017-03-02 DIAGNOSIS — I5022 Chronic systolic (congestive) heart failure: Secondary | ICD-10-CM | POA: Diagnosis not present

## 2017-03-02 DIAGNOSIS — N183 Chronic kidney disease, stage 3 unspecified: Secondary | ICD-10-CM

## 2017-03-02 DIAGNOSIS — Z7901 Long term (current) use of anticoagulants: Secondary | ICD-10-CM | POA: Diagnosis not present

## 2017-03-02 DIAGNOSIS — I10 Essential (primary) hypertension: Secondary | ICD-10-CM | POA: Diagnosis not present

## 2017-03-02 DIAGNOSIS — J449 Chronic obstructive pulmonary disease, unspecified: Secondary | ICD-10-CM

## 2017-03-02 DIAGNOSIS — R160 Hepatomegaly, not elsewhere classified: Secondary | ICD-10-CM | POA: Diagnosis not present

## 2017-03-02 MED ORDER — HYDRALAZINE HCL 50 MG PO TABS: 50.0000 mg | ORAL_TABLET | Freq: Two times a day (BID) | ORAL | 1 refills | Status: DC

## 2017-03-02 NOTE — Progress Notes (Signed)
Subjective:  Patient ID: [name] [name], male    DOB: [birth year]  Age: 76 y.o. MRN: [id number]  CC: Hypertension (pt here today for routine follow up of his chronic medical conditions, no other concerns voiced.)   HPI [name] [name] presents for  follow-up of hypertension. Patient has no history of headache chest pain or shortness of breath or recent cough. Patient also denies symptoms of TIA such as numbness weakness lateralizing. Patient checks  blood pressure at home and has not had any elevated readings recently. Patient denies side effects from medication. States taking it regularly.  Patient has been experiencing congestive heart failure symptoms for some time. However they seem to have leveled off in that he denies shortness of breath and edema. Although energy is not good it is stable and somewhat better than it's been for a few months now.[name] [name] was recently started and he feels that it is already helping some. Patient says that his heart doctor took him off of all of his other blood pressure pills.  Patient in for follow-up of elevated cholesterol. Doing well without complaints on current medication. Denies side effects of statin including myalgia and arthralgia and nausea. Also in today for liver function testing. Currently no chest pain, shortness of breath or other cardiovascular related symptoms noted.   Patient in for follow-up of atrial fibrillation. Patient denies any recent bouts of chest pain or palpitations. Additionally, patient is taking anticoagulants. Patient denies any recent excessive bleeding episodes including epistaxis, bleeding from the gums, genitalia, rectal bleeding or hematuria. Additionally there has been no excessive bruising.  History [name] [name] has a past medical history of Anemia; Arthritis; Asthma; Atrial fibrillation ([place]); CAD (coronary artery disease); Cholecystitis (11/2013); CKD (chronic kidney disease) (2015); COPD (chronic obstructive pulmonary  disease) ([place]); Esophageal reflux; Gallstones; Gastric antral vascular ectasia (2013); Gout; Hepatomegaly; Hiatal hernia; History of blood transfusion (~ 2012); Hyperlipidemia; Hypothyroidism; Other and unspecified coagulation defects; Personal history of colonic polyps (05/29/2010); and Unspecified essential hypertension.   He has a past surgical history that includes Coronary angioplasty with stent (~ 2008; 07/08/2014); Coronary artery bypass graft (1998); Hernia repair; Umbilical hernia repair (1970's); left and right heart catheterization with coronary/graft angiogram (N/A, 07/09/2014); Colonoscopy (2013); Esophagogastroduodenoscopy (2013); Givens capsule study (2013); cholecystomy tube (12/28/2014 - 01/06/2015); Esophagogastroduodenoscopy (N/A, 12/12/2015); and Cardiac catheterization (N/A, 07/15/2016).   His family history includes Cancer in his father; Early death in his brother; Heart attack in his brother; Heart disease in his father; Kidney disease in his mother; Leukemia in his mother; Stomach cancer in his sister and sister.He reports that he quit smoking about 20 years ago. His smoking use included Cigarettes. He has a 42.00 pack-year smoking history. He has never used smokeless tobacco. He reports that he drinks about 0.6 oz of alcohol per week . He reports that he does not use drugs.  Current Outpatient Prescriptions on File Prior to Visit  Medication Sig Dispense Refill  . acetaminophen (TYLENOL) 500 MG tablet Take 1,000 mg by mouth every 6 (six) hours as needed for mild pain. For pain.    . budesonide (PULMICORT) 0.25 MG/2ML nebulizer solution Take 0.25 mg by nebulization 2 (two) times daily as needed (shortness of breath).     . Cholecalciferol (VITAMIN D3) 5000 UNITS CAPS Take 5,000 Units by mouth daily.     Marland Kitchen docusate sodium (COLACE) 100 MG capsule Take 100 mg by mouth 2 (two) times daily as needed for mild constipation.    Marland Kitchen ENTRESTO 97-103 MG TAKE  1 TABLET BY MOUTH 2 (TWO) TIMES DAILY. 60  tablet 3  . ferrous sulfate 325 (65 FE) MG tablet Take 325 mg by mouth 2 (two) times daily.    . furosemide (LASIX) 40 MG tablet Take 0.5 tablets (20 mg total) by mouth as needed (if weight up 3 lbs overnight or 4 lb in a week take 20 mg for 3 days). 30 tablet   . ipratropium-albuterol (DUONEB) 0.5-2.5 (3) MG/3ML SOLN Take 3 mLs by nebulization every 6 (six) hours as needed. For shortness of breath.     . levothyroxine (SYNTHROID, LEVOTHROID) 112 MCG tablet TAKE 1 TABLET BY MOUTH  DAILY BEFORE BREAKFAST 90 tablet 0  . loratadine (CLARITIN) 10 MG tablet Take 10 mg by mouth daily after supper.    . lovastatin (MEVACOR) 40 MG tablet Take 2 tablets (80 mg total) by mouth at bedtime. 180 tablet 3  . Multiple Vitamins-Minerals (MULTIVITAMINS THER. W/MINERALS) TABS Take 1 tablet by mouth daily.      . nitroGLYCERIN (NITROSTAT) 0.4 MG SL tablet Place 0.4 mg under the tongue every 5 (five) minutes as needed for chest pain.    . pantoprazole (PROTONIX) 40 MG tablet Take 1 tablet (40 mg total) by mouth 2 (two) times daily. 180 tablet 3  . polyethylene glycol powder (GLYCOLAX/MIRALAX) powder Take 1 capful (17 grams) dissolved in at least 8 ounces water/juice once daily. 527 g 4  . PROAIR HFA 108 (90 Base) MCG/ACT inhaler Use 2 puffs every 6 hours  as needed for shortness of  breath 34 g 3  . spironolactone (ALDACTONE) 25 MG tablet TAKE 0.5 TABLETS (12.5 MG TOTAL) BY MOUTH DAILY. 15 tablet 3  . sucralfate (CARAFATE) 1 g tablet Take 1 tablet (1 g total) by mouth 4 (four) times daily -  with meals and at bedtime. 360 tablet 5  . warfarin (COUMADIN) 5 MG tablet TAKE 1 TABLET BY MOUTH  EVERY DAY OR AS DIRECTED BY ANTICOAGULATION CLINIC 90 tablet 0   No current facility-administered medications on file prior to visit.     ROS Review of Systems  Constitutional: Negative for chills, diaphoresis, fever and unexpected weight change.  HENT: Negative for congestion, hearing loss, rhinorrhea and sore throat.   Eyes:  Negative for visual disturbance.  Respiratory: Negative for cough and shortness of breath.   Cardiovascular: Negative for chest pain.  Gastrointestinal: Negative for abdominal pain, constipation and diarrhea.  Genitourinary: Negative for dysuria and flank pain.  Musculoskeletal: Negative for arthralgias and joint swelling.  Skin: Negative for rash.  Neurological: Negative for dizziness and headaches.  Psychiatric/Behavioral: Negative for dysphoric mood and sleep disturbance.    Objective:  BP (!) 153/61   Pulse 64   Temp 97.5 F (36.4 C) (Oral)   Ht '5\' 10"'$  (1.778 m)   Wt 175 lb 2 oz (79.4 kg)   BMI 25.13 kg/m   BP Readings from Last 3 Encounters:  03/02/17 (!) 153/61  01/27/17 134/64  12/16/16 (!) 132/56    Wt Readings from Last 3 Encounters:  03/02/17 175 lb 2 oz (79.4 kg)  01/27/17 178 lb 12 oz (81.1 kg)  12/16/16 175 lb (79.4 kg)     Physical Exam  Constitutional: He is oriented to person, place, and time. He appears well-developed and well-nourished. No distress.  HENT:  Head: Normocephalic and atraumatic.  Right Ear: External ear normal.  Left Ear: External ear normal.  Nose: Nose normal.  Mouth/Throat: Oropharynx is clear and moist.  Eyes: Conjunctivae and EOM  are normal. Pupils are equal, round, and reactive to light.  Neck: Normal range of motion. Neck supple. No thyromegaly present.  Cardiovascular: Normal rate, regular rhythm and normal heart sounds.   No murmur heard. Pulmonary/Chest: Effort normal and breath sounds normal. No respiratory distress. He has no wheezes. He has no rales.  Abdominal: Soft. Bowel sounds are normal. He exhibits no distension. There is no tenderness.  Lymphadenopathy:    He has no cervical adenopathy.  Neurological: He is alert and oriented to person, place, and time. He has normal reflexes.  Skin: Skin is warm and dry.  Psychiatric: He has a normal mood and affect. His behavior is normal. Judgment and thought content normal.      Lab Results  Component Value Date   WBC 8.4 01/27/2017   HGB 11.9 (L) 01/27/2017   HCT 36.6 (L) 01/27/2017   PLT 232 01/27/2017   GLUCOSE 91 01/27/2017   CHOL 144 09/06/2016   TRIG 84 09/06/2016   HDL 40 (L) 09/06/2016   LDLCALC 87 09/06/2016   ALT 16 12/01/2016   AST 22 12/01/2016   NA 141 01/27/2017   K 4.1 01/27/2017   CL 108 01/27/2017   CREATININE 1.11 01/27/2017   BUN 17 01/27/2017   CO2 26 01/27/2017   TSH 3.410 04/23/2015   PSA 0.78 11/26/2011   INR 1.5 (H) 02/09/2017    No results found.  Assessment & Plan:   [name] [name] was seen today for hypertension.  Diagnoses and all orders for this visit:  Essential hypertension -     CBC with Differential/Platelet -     CMP14+EGFR  Chronic systolic CHF (congestive heart failure) (HCC) -     CBC with Differential/Platelet -     CMP14+EGFR -     Brain natriuretic peptide  Chronic anticoagulation -     CBC with Differential/Platelet -     CMP14+EGFR  Asthma with COPD (HCC) -     CBC with Differential/Platelet -     CMP14+EGFR  Mixed hyperlipidemia -     CBC with Differential/Platelet -     CMP14+EGFR  CKD (chronic kidney disease), stage III -     CBC with Differential/Platelet -     CMP14+EGFR  Atrial fibrillation, chronic (HCC) -     CBC with Differential/Platelet -     CMP14+EGFR -     Brain natriuretic peptide  Bradycardia -     CBC with Differential/Platelet -     CMP14+EGFR -     TSH + free T4  Other orders -     hydrALAZINE (APRESOLINE) 50 MG tablet; Take 1 tablet (50 mg total) by mouth 2 (two) times daily.   I am having [name] [name] start on hydrALAZINE. I am also having him maintain his ipratropium-albuterol, budesonide, multivitamins ther. w/minerals, Vitamin D3, acetaminophen, nitroGLYCERIN, ferrous sulfate, docusate sodium, polyethylene glycol powder, PROAIR HFA, sucralfate, pantoprazole, loratadine, lovastatin, warfarin, levothyroxine, spironolactone, furosemide, and ENTRESTO.  Meds  ordered this encounter  Medications  . hydrALAZINE (APRESOLINE) 50 MG tablet    Sig: Take 1 tablet (50 mg total) by mouth 2 (two) times daily.    Dispense:  60 tablet    Refill:  1    Normally would have him come back in about 4 weeks for follow-up on his elevated blood pressure. However, he states that he has a follow-up with his cardiologist who will assist with following this in just a couple of weeks possibly a month  Follow-up: Return in about 3  months (around 06/02/2017).  Claretta Fraise, M.D.

## 2017-03-03 LAB — CMP14+EGFR
ALT: 15 IU/L (ref 0–44)
AST: 21 IU/L (ref 0–40)
Albumin/Globulin Ratio: 1.6 (ref 1.2–2.2)
Albumin: 4.1 g/dL (ref 3.5–4.8)
Alkaline Phosphatase: 54 IU/L (ref 39–117)
BUN/Creatinine Ratio: 16 (ref 10–24)
BUN: 18 mg/dL (ref 8–27)
Bilirubin Total: 0.5 mg/dL (ref 0.0–1.2)
CALCIUM: 9.5 mg/dL (ref 8.6–10.2)
CO2: 25 mmol/L (ref 18–29)
CREATININE: 1.14 mg/dL (ref 0.76–1.27)
Chloride: 102 mmol/L (ref 96–106)
GFR calc Af Amer: 71 mL/min/{1.73_m2} (ref >59)
GFR, EST NON AFRICAN AMERICAN: 62 mL/min/{1.73_m2} (ref >59)
GLOBULIN, TOTAL: 2.6 g/dL (ref 1.5–4.5)
GLUCOSE: 83 mg/dL (ref 65–99)
Potassium: 4.2 mmol/L (ref 3.5–5.2)
SODIUM: 143 mmol/L (ref 134–144)
Total Protein: 6.7 g/dL (ref 6.0–8.5)

## 2017-03-03 LAB — CBC WITH DIFFERENTIAL/PLATELET
BASOS ABS: 0.1 10*3/uL (ref 0.0–0.2)
Basos: 1 %
EOS (ABSOLUTE): 0.2 10*3/uL (ref 0.0–0.4)
EOS: 3 %
HEMATOCRIT: 38.2 % (ref 37.5–51.0)
Hemoglobin: 12.6 g/dL — ABNORMAL LOW (ref 13.0–17.7)
IMMATURE GRANULOCYTES: 0 %
Immature Grans (Abs): 0 10*3/uL (ref 0.0–0.1)
LYMPHS ABS: 1 10*3/uL (ref 0.7–3.1)
Lymphs: 11 %
MCH: 32.6 pg (ref 26.6–33.0)
MCHC: 33 g/dL (ref 31.5–35.7)
MCV: 99 fL — ABNORMAL HIGH (ref 79–97)
MONOS ABS: 0.8 10*3/uL (ref 0.1–0.9)
Monocytes: 8 %
NEUTROS PCT: 77 %
Neutrophils Absolute: 7.4 10*3/uL — ABNORMAL HIGH (ref 1.4–7.0)
PLATELETS: 298 10*3/uL (ref 150–379)
RBC: 3.86 x10E6/uL — AB (ref 4.14–5.80)
RDW: 13.3 % (ref 12.3–15.4)
WBC: 9.5 10*3/uL (ref 3.4–10.8)

## 2017-03-03 LAB — TSH+FREE T4
Free T4: 1.68 ng/dL (ref 0.82–1.77)
TSH: 1.16 u[IU]/mL (ref 0.450–4.500)

## 2017-03-03 LAB — BRAIN NATRIURETIC PEPTIDE: BNP: 639.9 pg/mL — ABNORMAL HIGH (ref 0.0–100.0)

## 2017-03-08 DIAGNOSIS — J449 Chronic obstructive pulmonary disease, unspecified: Secondary | ICD-10-CM | POA: Diagnosis not present

## 2017-03-18 ENCOUNTER — Ambulatory Visit (HOSPITAL_COMMUNITY)
Admission: RE | Admit: 2017-03-18 | Discharge: 2017-03-18 | Disposition: A | Discharge status: Home / Self Care | Payer: Medicare Other | Source: Ambulatory Visit | Attending: Internal Medicine | Admitting: Internal Medicine

## 2017-03-18 ENCOUNTER — Other Ambulatory Visit: Payer: Self-pay | Admitting: Internal Medicine

## 2017-03-18 ENCOUNTER — Ambulatory Visit (HOSPITAL_BASED_OUTPATIENT_CLINIC_OR_DEPARTMENT_OTHER)
Admission: RE | Admit: 2017-03-18 | Discharge: 2017-03-18 | Disposition: A | Discharge status: Home / Self Care | Payer: Medicare Other | Source: Ambulatory Visit | Attending: Gastroenterology | Admitting: Gastroenterology

## 2017-03-18 VITALS — BP 168/52 | HR 93 | Wt 173.4 lb

## 2017-03-18 DIAGNOSIS — I2582 Chronic total occlusion of coronary artery: Secondary | ICD-10-CM | POA: Diagnosis not present

## 2017-03-18 DIAGNOSIS — M109 Gout, unspecified: Secondary | ICD-10-CM | POA: Insufficient documentation

## 2017-03-18 DIAGNOSIS — N183 Chronic kidney disease, stage 3 unspecified: Secondary | ICD-10-CM

## 2017-03-18 DIAGNOSIS — Z87891 Personal history of nicotine dependence: Secondary | ICD-10-CM | POA: Diagnosis not present

## 2017-03-18 DIAGNOSIS — Z7901 Long term (current) use of anticoagulants: Secondary | ICD-10-CM | POA: Insufficient documentation

## 2017-03-18 DIAGNOSIS — J449 Chronic obstructive pulmonary disease, unspecified: Secondary | ICD-10-CM | POA: Diagnosis not present

## 2017-03-18 DIAGNOSIS — Z8249 Family history of ischemic heart disease and other diseases of the circulatory system: Secondary | ICD-10-CM | POA: Diagnosis not present

## 2017-03-18 DIAGNOSIS — Z951 Presence of aortocoronary bypass graft: Secondary | ICD-10-CM | POA: Insufficient documentation

## 2017-03-18 DIAGNOSIS — I255 Ischemic cardiomyopathy: Secondary | ICD-10-CM | POA: Insufficient documentation

## 2017-03-18 DIAGNOSIS — I2510 Atherosclerotic heart disease of native coronary artery without angina pectoris: Secondary | ICD-10-CM | POA: Diagnosis not present

## 2017-03-18 DIAGNOSIS — Z9981 Dependence on supplemental oxygen: Secondary | ICD-10-CM | POA: Insufficient documentation

## 2017-03-18 DIAGNOSIS — I130 Hypertensive heart and chronic kidney disease with heart failure and stage 1 through stage 4 chronic kidney disease, or unspecified chronic kidney disease: Secondary | ICD-10-CM | POA: Insufficient documentation

## 2017-03-18 DIAGNOSIS — I5022 Chronic systolic (congestive) heart failure: Secondary | ICD-10-CM | POA: Diagnosis not present

## 2017-03-18 DIAGNOSIS — Z806 Family history of leukemia: Secondary | ICD-10-CM | POA: Insufficient documentation

## 2017-03-18 DIAGNOSIS — Z800 Family history of malignant neoplasm of digestive organs: Secondary | ICD-10-CM | POA: Diagnosis not present

## 2017-03-18 DIAGNOSIS — K922 Gastrointestinal hemorrhage, unspecified: Secondary | ICD-10-CM | POA: Insufficient documentation

## 2017-03-18 DIAGNOSIS — I25708 Atherosclerosis of coronary artery bypass graft(s), unspecified, with other forms of angina pectoris: Secondary | ICD-10-CM | POA: Diagnosis not present

## 2017-03-18 DIAGNOSIS — R001 Bradycardia, unspecified: Secondary | ICD-10-CM | POA: Insufficient documentation

## 2017-03-18 DIAGNOSIS — Z79899 Other long term (current) drug therapy: Secondary | ICD-10-CM | POA: Insufficient documentation

## 2017-03-18 DIAGNOSIS — E039 Hypothyroidism, unspecified: Secondary | ICD-10-CM | POA: Diagnosis not present

## 2017-03-18 DIAGNOSIS — I482 Chronic atrial fibrillation: Secondary | ICD-10-CM | POA: Insufficient documentation

## 2017-03-18 DIAGNOSIS — E785 Hyperlipidemia, unspecified: Secondary | ICD-10-CM | POA: Insufficient documentation

## 2017-03-18 DIAGNOSIS — R42 Dizziness and giddiness: Secondary | ICD-10-CM | POA: Insufficient documentation

## 2017-03-18 LAB — BASIC METABOLIC PANEL
ANION GAP: 9 (ref 5–15)
BUN: 22 mg/dL — ABNORMAL HIGH (ref 6–20)
CALCIUM: 9.6 mg/dL (ref 8.9–10.3)
CO2: 25 mmol/L (ref 22–32)
CREATININE: 1.16 mg/dL (ref 0.61–1.24)
Chloride: 106 mmol/L (ref 101–111)
GFR calc non Af Amer: 59 mL/min — ABNORMAL LOW (ref >60)
Glucose, Bld: 101 mg/dL — ABNORMAL HIGH (ref 65–99)
Potassium: 4 mmol/L (ref 3.5–5.1)
SODIUM: 140 mmol/L (ref 135–145)

## 2017-03-18 LAB — CBC
HCT: 36.8 % — ABNORMAL LOW (ref 39.0–52.0)
HEMOGLOBIN: 12.2 g/dL — AB (ref 13.0–17.0)
MCH: 32.4 pg (ref 26.0–34.0)
MCHC: 33.2 g/dL (ref 30.0–36.0)
MCV: 97.6 fL (ref 78.0–100.0)
Platelets: 257 10*3/uL (ref 150–400)
RBC: 3.77 MIL/uL — AB (ref 4.22–5.81)
RDW: 13 % (ref 11.5–15.5)
WBC: 8.7 10*3/uL (ref 4.0–10.5)

## 2017-03-18 MED ORDER — HYDRALAZINE HCL 50 MG PO TABS: 50.0000 mg | ORAL_TABLET | Freq: Three times a day (TID) | ORAL | 6 refills | Status: DC

## 2017-03-18 MED ORDER — NITROGLYCERIN 0.4 MG SL SUBL: 0.4000 mg | SUBLINGUAL_TABLET | SUBLINGUAL | 1 refills | Status: AC | PRN

## 2017-03-18 NOTE — Progress Notes (Signed)
Advanced Heart Failure Medication Review by a Pharmacist  Does the patient  feel that his/her medications are working for him/her?  yes  Has the patient been experiencing any side effects to the medications prescribed?  no  Does the patient measure his/her own blood pressure or blood glucose at home?  yes   Does the patient have any problems obtaining medications due to transportation or finances?   no  Understanding of regimen: good Understanding of indications: good Potential of compliance: good Patient understands to avoid NSAIDs. Patient understands to avoid decongestants.  Issues to address at subsequent visits: none   Pharmacist comments: [name] [name] is a pleasant 76 yo male presenting with wife and medication list. No issues with medications at this time. Per patient, has expired bottle of SL NTG and needs refill. Has not used any in several years.   Carlean Jews, Pharm.D. PGY1 Pharmacy Resident 6/15/201811:11 AM Pager [phone]     Time with patient: 7 mins Preparation and documentation time: 3 mins Total time: 10 mins

## 2017-03-18 NOTE — Patient Instructions (Signed)
Routine lab work today. Will notify you of abnormal results, otherwise no news is good news!  INCREASE Hydralazine to 50 mg (1 tab) THREE TIMES DAILY (Once every 8 hours).  Follow up with Dr. Aundra Dubin in 3 months. Take all medication as prescribed the day of your appointment. Bring all medications with you to your appointment.  Do the following things EVERYDAY: 1) Weigh yourself in the morning before breakfast. Write it down and keep it in a log. 2) Take your medicines as prescribed 3) Eat low salt foods-Limit salt (sodium) to 2000 mg per day.  4) Stay as active as you can everyday 5) Limit all fluids for the day to less than 2 liters

## 2017-03-18 NOTE — Progress Notes (Signed)
  Echocardiogram 2D Echocardiogram has been performed.  Darlina Sicilian M 03/18/2017, 10:34 AM

## 2017-03-18 NOTE — Progress Notes (Signed)
PCP: Dr. Livia Snellen Cardiology: Dr. Tamala Julian HF Cardiology: Dr. Aundra Dubin  76 yo with chronic atrial fibrillation, CAD s/p CABG, and chronic systolic CHF presents for CHF clinic followup.  Patient had CABG in 1998.  He has had chronic atrial fibrillation long-term.  Last echo in 6/17 showed EF 25-30%.  Most recent cardiac cath was in 10/17.  This showed occluded native vessels with occluded SVG-dLAD and SVG-RCA.  SVG-OM and LIMA-LAD were patent (LIMA had stable 85% ostial stenosis).  No interventional options, medically managed.  Cardiac output was preserved, filling pressures were mildly elevated.    Patient was sent to Dr Lovena Le for consideration of ICD.  He was on minimal HF meds, so it was recommended that he start HF meds and repeat an echo prior to ICD placement.    He presents today for regular follow up. Echo done this am. Still having dizzy/lightheadedness at times with no clear correlations. Still working out 4-5 days a week at [employer], playing golf once a week. No SOB on flat ground or stairs. Denies SOB. Took am medications.   Labs (10/17): hgb 10.2, K 4.1, creatinine 1.3 Labs (11/17): K 4.3, creatinine 1.5, BNP 638, LDL 87 Labs (2/18): K 4.1, creatinine 1.16, BNP 589 Labs (3/18): BNP 947, K 4.3, creatinine 1.09  PMH: 1. Gout 2. Hyperlipidemia 3. Lower GI bleed: Colonic AVMs. 4. Atrial fibrillation: Chronic 5. HTN 6. 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 CAD: CABG x 4 in 1998. - LHC (10/17) with total occlusion SVG-dLAD, total occlusion SVG-RCA (new), patent SVG-OM, 85% ostial LIMA-LAD (unchanged), total occlusion of native LAD, LCx, and RCA.  No interventional option.  11. Chronic systolic CHF: Ischemic cardiomyopathy.   - Echo (6/17) with EF 25-30%, mild MR, mildly dilated RV with mildly decreased systolic function.   - RHC (10/17): mean RA 11, PA 49/14, mean PCWP 17, CI 3.84.  12. Sinus bradycardia  SH: Married,  quit smoking 1998, retired from [employer], lives in [place].    Family History  Problem Relation Age of Onset  . Leukemia Mother   . Kidney disease Mother        kidney removed   . Heart attack Brother   . Cancer Father   . Heart disease Father   . Stomach cancer Sister   . Stomach cancer Sister   . Early death Brother   . Hypertension Unknown        family   . Colon cancer Neg Hx    Review of systems complete and found to be negative unless listed in HPI.    Current Outpatient Prescriptions  Medication Sig Dispense Refill  . acetaminophen (TYLENOL) 500 MG tablet Take 1,000 mg by mouth every 6 (six) hours as needed for mild pain. For pain.    . budesonide (PULMICORT) 0.25 MG/2ML nebulizer solution Take 0.25 mg by nebulization 2 (two) times daily.     . Cholecalciferol (VITAMIN D3) 5000 UNITS CAPS Take 5,000 Units by mouth daily.     Marland Kitchen docusate sodium (COLACE) 100 MG capsule Take 100 mg by mouth 2 (two) times daily as needed for mild constipation.    Marland Kitchen ENTRESTO 97-103 MG TAKE 1 TABLET BY MOUTH 2 (TWO) TIMES DAILY. 60 tablet 3  . ferrous sulfate 325 (65 FE) MG tablet Take 325 mg by mouth 2 (two) times daily.    . furosemide (LASIX) 40 MG tablet Take 0.5 tablets (20 mg total) by mouth  as needed (if weight up 3 lbs overnight or 4 lb in a week take 20 mg for 3 days). 30 tablet   . hydrALAZINE (APRESOLINE) 50 MG tablet Take 1 tablet (50 mg total) by mouth 2 (two) times daily. 60 tablet 1  . ipratropium-albuterol (DUONEB) 0.5-2.5 (3) MG/3ML SOLN Take 3 mLs by nebulization every 6 (six) hours as needed. For shortness of breath.     . levothyroxine (SYNTHROID, LEVOTHROID) 112 MCG tablet TAKE 1 TABLET BY MOUTH  DAILY BEFORE BREAKFAST 90 tablet 0  . loratadine (CLARITIN) 10 MG tablet Take 10 mg by mouth daily after supper.    . lovastatin (MEVACOR) 40 MG tablet Take 2 tablets (80 mg total) by mouth at bedtime. 180 tablet 3  . Multiple Vitamins-Minerals (MULTIVITAMINS THER.  W/MINERALS) TABS Take 1 tablet by mouth daily.      . pantoprazole (PROTONIX) 40 MG tablet Take 1 tablet (40 mg total) by mouth 2 (two) times daily. 180 tablet 3  . PROAIR HFA 108 (90 Base) MCG/ACT inhaler Use 2 puffs every 6 hours  as needed for shortness of  breath 34 g 3  . spironolactone (ALDACTONE) 25 MG tablet TAKE 0.5 TABLETS (12.5 MG TOTAL) BY MOUTH DAILY. 15 tablet 3  . sucralfate (CARAFATE) 1 g tablet Take 1 tablet (1 g total) by mouth 4 (four) times daily -  with meals and at bedtime. 360 tablet 5  . warfarin (COUMADIN) 5 MG tablet TAKE 1 TABLET BY MOUTH  EVERY DAY OR AS DIRECTED BY ANTICOAGULATION CLINIC 90 tablet 0  . nitroGLYCERIN (NITROSTAT) 0.4 MG SL tablet Place 0.4 mg under the tongue every 5 (five) minutes as needed for chest pain.     No current facility-administered medications for this encounter.    BP (!) 168/52 (BP Location: Right Arm, Patient Position: Sitting, Cuff Size: Normal)   Pulse 93 Comment: irregular  Wt 173 lb 6.4 oz (78.7 kg)   SpO2 94%   BMI 24.88 kg/m    Wt Readings from Last 3 Encounters:  03/18/17 173 lb 6.4 oz (78.7 kg)  03/02/17 175 lb 2 oz (79.4 kg)  01/27/17 178 lb 12 oz (81.1 kg)    General: Well appearing. No resp difficulty. HEENT: normal Neck: supple. JVP 5-6. Carotids 2+ bilat; no bruits. No thyromegaly or nodule noted. Cor: PMI nondisplaced. Irregular and slightly brady. 1/6 HSM LLSB, No M/G/R noted Lungs: CTAB, normal effort. Abdomen: soft, non-tender, distended, no HSM. No bruits or masses. +BS  Extremities: no cyanosis, clubbing, rash, R and LLE no edema.  Neuro: alert & orientedx3, cranial nerves grossly intact. moves all 4 extremities w/o difficulty. Affect pleasant   EKG today with Afib 64 bpm and RBBB and LAFB  Assessment/Plan: 1. CAD: s/p CABG 1998.  Occluded native vessels, patent SVG-OM and LIMA-LAD (chronic 85% ostial LIMA stenosis).  No chest pain.  No good interventional options.  - No ASA given stable CAD and use of  warfarin. No change.   - Continue statin, LDL acceptable at last check.  If it rises any more, will need more potent statin.  2. Chronic systolic CHF: Ischemic cardiomyopathy.  Echo (6/17) with EF 25-30%, mildly dilated RV with decreased RV systolic function.  - NYHA class II - Volume status stable on exam. ? Occasional dizzy spells due to bradycardia. Now off coreg with 3-4 second pauses overnight with Holter monitor.  - Continue Entresto 97/103 bid.  - Continue spironolactone 12.5 daily, hold off on increase with occasional dizzy spells.  -  Increase hydralazine to 50 mg TID - Continue lasix prn.  -Repeat Echo this am. Results pending. Will need ICD consideration if EF low.  3. Atrial fibrillation: Chronic.   - Unlikely to cardiovert with chronicity. Continue coumadin. No bleeding. CBC today.  4. CKD stage 3:  - BMET today.   5. COPD:  - Stable. Wear 02 at night.  6. Nocturnal bradycardia - Off coreg.  - Scheduled for sleep study 04/2017.  Labs today. Follow up 3 months.  Possible EP referral pending Echo deferral.   Shirley Friar, PA-C  03/18/2017   Greater than 50% of the 25 minute visit was spent in counseling/coordination of care regarding disease state education, sliding scale diuretics, ICD consideration, and discussion of medical regimen with Pharm-D.

## 2017-03-23 ENCOUNTER — Telehealth (HOSPITAL_COMMUNITY): Payer: Self-pay

## 2017-03-23 DIAGNOSIS — R9430 Abnormal result of cardiovascular function study, unspecified: Secondary | ICD-10-CM

## 2017-03-23 NOTE — Telephone Encounter (Signed)
ECHOCARDIOGRAM COMPLETE  Order: [id number]  Status:  Final result Visible to patient:  Yes (MyChart) Dx:  Chronic systolic CHF (congestive hear...  Notes recorded by Effie Berkshire, RN on 03/23/2017 at 2:09 PM EDT Patient aware and agreeable, prefers to see Dr. Lovena Le as he has in the past, will place referral for him. ------  Notes recorded by Larey Dresser, MD on 03/20/2017 at 2:12 PM EDT EF 30-35%, would refer to EP for ICD at this point.

## 2017-03-25 ENCOUNTER — Other Ambulatory Visit: Payer: Self-pay | Admitting: Family Medicine

## 2017-03-25 ENCOUNTER — Encounter (HOSPITAL_COMMUNITY): Payer: Medicare Other

## 2017-03-25 MED ORDER — LEVOTHYROXINE SODIUM 112 MCG PO TABS: ORAL_TABLET | ORAL | 2 refills | Status: DC

## 2017-03-25 NOTE — Telephone Encounter (Signed)
done

## 2017-03-25 NOTE — Telephone Encounter (Signed)
What is the name of the medication? Mylan-levothyrox 0.112  Have you contacted your pharmacy to request a refill? NO  Which pharmacy would you like this sent to? [facility] in [place]   Patient notified that their request is being sent to the clinical staff for review and that they should receive a call once it is complete. If they do not receive a call within 24 hours they can check with their pharmacy or our office.

## 2017-03-28 ENCOUNTER — Encounter: Payer: Self-pay | Admitting: Internal Medicine

## 2017-03-28 ENCOUNTER — Ambulatory Visit (INDEPENDENT_AMBULATORY_CARE_PROVIDER_SITE_OTHER): Payer: Medicare Other | Admitting: Internal Medicine

## 2017-03-28 VITALS — BP 148/52 | HR 74 | Ht 70.0 in | Wt 174.2 lb

## 2017-03-28 DIAGNOSIS — I5022 Chronic systolic (congestive) heart failure: Secondary | ICD-10-CM | POA: Diagnosis not present

## 2017-03-28 DIAGNOSIS — Z01812 Encounter for preprocedural laboratory examination: Secondary | ICD-10-CM | POA: Diagnosis not present

## 2017-03-28 DIAGNOSIS — J449 Chronic obstructive pulmonary disease, unspecified: Secondary | ICD-10-CM | POA: Diagnosis not present

## 2017-03-28 NOTE — Patient Instructions (Addendum)
Medication Instructions:  Your physician recommends that you continue on your current medications as directed. Please refer to the Current Medication list given to you today.   Labwork: TODAY: BMET / CBC  Testing/Procedures: Your physician has recommended that you have a defibrillator inserted. An implantable cardioverter defibrillator (ICD) is a small device that is placed in your chest or, in rare cases, your abdomen. This device uses electrical pulses or shocks to help control life-threatening, irregular heartbeats that could lead the heart to suddenly stop beating (sudden cardiac arrest). Leads are attached to the ICD that goes into your heart. This is done in the hospital and usually requires an overnight stay. Please see the instruction sheet given to you today for more information.     Follow-Up: Follow-up for a wound check in the Device Clinic 10-14 days from date of procedure and with Dr. Lovena Le 91 days from date of implant..   Any Other Special Instructions Will Be Listed Below ---  HOLD Coumadin 2 days prior to procedure. Last dose on 04/02/17.  Please wash with the CHG Soap the night before and morning of procedure (follow instruction page "Preparing For Surgery").   Report to the [place] [facility] on 04/05/17 at 5:30 AM  Nothing to eat or drink after midnight the night before procedure  Do not take any medication the morning prior to procedure  Plan 1 night stay    If you need a refill on your cardiac medications before your next appointment, please call your pharmacy.

## 2017-03-29 LAB — CBC WITH DIFFERENTIAL/PLATELET
BASOS: 1 %
Basophils Absolute: 0.1 10*3/uL (ref 0.0–0.2)
EOS (ABSOLUTE): 0.4 10*3/uL (ref 0.0–0.4)
Eos: 5 %
Hematocrit: 37.6 % (ref 37.5–51.0)
Hemoglobin: 12.4 g/dL — ABNORMAL LOW (ref 13.0–17.7)
Immature Grans (Abs): 0 10*3/uL (ref 0.0–0.1)
Immature Granulocytes: 0 %
Lymphocytes Absolute: 0.9 10*3/uL (ref 0.7–3.1)
Lymphs: 10 %
MCH: 32.6 pg (ref 26.6–33.0)
MCHC: 33 g/dL (ref 31.5–35.7)
MCV: 99 fL — AB (ref 79–97)
MONOS ABS: 1.1 10*3/uL — AB (ref 0.1–0.9)
Monocytes: 13 %
NEUTROS ABS: 6.2 10*3/uL (ref 1.4–7.0)
NEUTROS PCT: 71 %
PLATELETS: 279 10*3/uL (ref 150–379)
RBC: 3.8 x10E6/uL — ABNORMAL LOW (ref 4.14–5.80)
RDW: 13.5 % (ref 12.3–15.4)
WBC: 8.6 10*3/uL (ref 3.4–10.8)

## 2017-03-29 LAB — BASIC METABOLIC PANEL
BUN/Creatinine Ratio: 18 (ref 10–24)
BUN: 19 mg/dL (ref 8–27)
CHLORIDE: 102 mmol/L (ref 96–106)
CO2: 24 mmol/L (ref 20–29)
Calcium: 9.6 mg/dL (ref 8.6–10.2)
Creatinine, Ser: 1.05 mg/dL (ref 0.76–1.27)
GFR, EST AFRICAN AMERICAN: 79 mL/min/{1.73_m2} (ref >59)
GFR, EST NON AFRICAN AMERICAN: 68 mL/min/{1.73_m2} (ref >59)
Glucose: 85 mg/dL (ref 65–99)
POTASSIUM: 4.5 mmol/L (ref 3.5–5.2)
SODIUM: 143 mmol/L (ref 134–144)

## 2017-03-29 NOTE — Progress Notes (Signed)
HPI [name] is a pleasant 76yo man with a h/o chronic systolic hear failure class 2, EF 25%, an ICM, s/p CABG, atrial fib, HTN, chronic renal insufficiency, stage 3, and COPD. The patient has never had syncope. He has been managed with maximal medical therapy. He has class 2 symptoms. He appears to be relegated to rate control strategy. No Known Allergies   Current Outpatient Prescriptions  Medication Sig Dispense Refill  . acetaminophen (TYLENOL) 500 MG tablet Take 1,000 mg by mouth every 6 (six) hours as needed for mild pain. For pain.    . budesonide (PULMICORT) 0.25 MG/2ML nebulizer solution Take 0.25 mg by nebulization 2 (two) times daily.     . Cholecalciferol (VITAMIN D3) 5000 UNITS CAPS Take 5,000 Units by mouth daily.     Marland Kitchen docusate sodium (COLACE) 100 MG capsule Take 100 mg by mouth 2 (two) times daily as needed for mild constipation.    Marland Kitchen ENTRESTO 97-103 MG TAKE 1 TABLET BY MOUTH 2 (TWO) TIMES DAILY. 60 tablet 3  . ferrous sulfate 325 (65 FE) MG tablet Take 325 mg by mouth 2 (two) times daily.    . furosemide (LASIX) 40 MG tablet Take 0.5 tablets (20 mg total) by mouth as needed (if weight up 3 lbs overnight or 4 lb in a week take 20 mg for 3 days). 30 tablet   . hydrALAZINE (APRESOLINE) 50 MG tablet Take 1 tablet (50 mg total) by mouth 3 (three) times daily. 90 tablet 6  . ipratropium-albuterol (DUONEB) 0.5-2.5 (3) MG/3ML SOLN Take 3 mLs by nebulization every 6 (six) hours as needed. For shortness of breath.     . levothyroxine (SYNTHROID, LEVOTHROID) 112 MCG tablet TAKE 1 TABLET BY MOUTH  DAILY BEFORE BREAKFAST 90 tablet 2  . loratadine (CLARITIN) 10 MG tablet Take 10 mg by mouth daily after supper.    . lovastatin (MEVACOR) 40 MG tablet Take 2 tablets (80 mg total) by mouth at bedtime. 180 tablet 3  . Multiple Vitamins-Minerals (MULTIVITAMINS THER. W/MINERALS) TABS Take 1 tablet by mouth daily.      . nitroGLYCERIN (NITROSTAT) 0.4 MG SL tablet Place 1 tablet (0.4 mg  total) under the tongue every 5 (five) minutes as needed for chest pain. 6 tablet 1  . pantoprazole (PROTONIX) 40 MG tablet Take 1 tablet (40 mg total) by mouth 2 (two) times daily. 180 tablet 3  . PROAIR HFA 108 (90 Base) MCG/ACT inhaler Use 2 puffs every 6 hours  as needed for shortness of  breath 34 g 3  . sucralfate (CARAFATE) 1 g tablet Take 1 tablet (1 g total) by mouth 4 (four) times daily -  with meals and at bedtime. 360 tablet 5  . warfarin (COUMADIN) 5 MG tablet TAKE 1 TABLET BY MOUTH  EVERY DAY OR AS DIRECTED BY ANTICOAGULATION CLINIC 90 tablet 0  . spironolactone (ALDACTONE) 25 MG tablet TAKE 0.5 TABLETS (12.5 MG TOTAL) BY MOUTH DAILY. 15 tablet 3   No current facility-administered medications for this visit.      Past Medical History:  Diagnosis Date  . Anemia   . Arthritis    "hips; back" (07/09/2014)  . Asthma   . Atrial fibrillation ([place])   . CAD (coronary artery disease)   . Cholecystitis 11/2013  . 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   . COPD (chronic obstructive pulmonary disease) (HCC)    on home oxygen, 2 liters at night10/2015  . Esophageal  reflux   . Gallstones   . Gastric antral vascular ectasia 2013  . Gout   . Hepatomegaly   . Hiatal hernia   . History of blood transfusion ~ 2012   "blood count dropped; had to get 3 units"  . Hyperlipidemia   . Hypothyroidism   . Other and unspecified coagulation defects   . Personal history of colonic polyps 05/29/2010   TUBULAR ADENOMA  . Unspecified essential hypertension     ROS:   All systems reviewed and negative except as noted in the HPI.   Past Surgical History:  Procedure Laterality Date  . CARDIAC CATHETERIZATION N/A 07/15/2016   Procedure: Right/Left Heart Cath and Coronary/Graft Angiography;  Surgeon: Belva Crome, MD;  Location: [facility];  Service: Cardiovascular;  Laterality: N/A;  . cholecystomy tube  12/28/2014 - 01/06/2015   tube clogged with debris, removed and IR unable to  place new tube.   . COLONOSCOPY  2013   Dr. Sharlett Iles: no polyps or evidence of active bleeding  . CORONARY ANGIOPLASTY WITH STENT PLACEMENT  ~ 2008; 07/08/2014   "1 + 3"  . CORONARY ARTERY BYPASS GRAFT  1998   CABG X4  . ESOPHAGOGASTRODUODENOSCOPY  2013   Dr. 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 ESOPHAGOGASTRODUODENOSCOPY N/A 12/12/2015   Procedure: ESOPHAGOGASTRODUODENOSCOPY (EGD);  Surgeon: Gatha Mayer, MD;  Location: [facility];  Service: Endoscopy;  Laterality: N/A;  . GIVENS CAPSULE STUDY  2013   Dr. Sharlett Iles: AVM at 49 and blood at 30 minutes beyond first duodenal image but not actual lesion seen. If persistent IDA, bleeding, recommend enteroscopy with ablation   . HERNIA REPAIR    . LEFT AND RIGHT HEART CATHETERIZATION WITH CORONARY/GRAFT ANGIOGRAM N/A 07/09/2014   Right and left heart cath, bare metal stent to SVG to RCA. Sinclair Grooms, MD;   . UMBILICAL HERNIA REPAIR  [phone]     Family History  Problem Relation Age of Onset  . Leukemia Mother   . Kidney disease Mother        kidney removed   . Heart attack Brother   . Cancer Father   . Heart disease Father   . Stomach cancer Sister   . Stomach cancer Sister   . Early death Brother   . Hypertension Unknown        family   . Colon cancer Neg Hx      Social History   Social History  . Marital status: Married    Spouse name: [name]  . Number of children: 2  . Years of education: N/A   Occupational History  . retired    Social History Main Topics  . Smoking status: Former Smoker    Packs/day: 1.00    Years: 42.00    Types: Cigarettes    Quit date: 12/16/1996  . Smokeless tobacco: Never Used  . Alcohol use 0.6 oz/week    1 Cans of beer per week     Comment: 1 per week  . Drug use: No  . Sexual activity: No   Other Topics Concern  . Not on file   Social History Narrative  . No narrative on file     BP (!) 148/52   Pulse 74   Ht 5\' 10"   (1.778 m)   Wt 174 lb 3.2 oz (79 kg)   BMI 25.00 kg/m   Physical Exam:  Well appearing 76 yo man, NAD HEENT: Unremarkable Neck:  6 cm JVD, no thyromegally Lymphatics:  No adenopathy Back:  No CVA tenderness Lungs:  Clear with no wheezes HEART:  IRegular rate rhythm, no murmurs, no rubs, no clicks Abd:  soft, positive bowel sounds, no organomegally, no rebound, no guarding Ext:  2 plus pulses, no edema, no cyanosis, no clubbing Skin:  No rashes no nodules Neuro:  CN II through XII intact, motor grossly intact  EKG - atrial fib with a controllled vs irregular ventricular response   DEVICE  Normal device function.  See PaceArt for details. none  Assess/Plan: 1. ICM - his EF is 30 by echo.  We discussed insertion of an ICD. He wishes to proceed.  Will schedule as he is able.  2 atrial fib - unlikely that his LV dysfunction is related to a rapid VR but will plan to uptitrate his meds.  3.HTN - his blood pressure is not well controlled. We will follow and encourage d alow sodium diet.  Cristopher Peru, M.D.

## 2017-03-30 ENCOUNTER — Ambulatory Visit (INDEPENDENT_AMBULATORY_CARE_PROVIDER_SITE_OTHER): Payer: Medicare Other | Admitting: Pharmacist

## 2017-03-30 DIAGNOSIS — I482 Chronic atrial fibrillation, unspecified: Secondary | ICD-10-CM

## 2017-03-30 LAB — COAGUCHEK XS/INR WAIVED
INR: 1.5 — AB (ref 0.9–1.1)
Prothrombin Time: 17.9 s

## 2017-04-05 ENCOUNTER — Encounter (HOSPITAL_COMMUNITY)
Admission: RE | Disposition: A | Discharge status: Home / Self Care | Payer: Self-pay | Source: Ambulatory Visit | Attending: Internal Medicine

## 2017-04-05 ENCOUNTER — Ambulatory Visit (HOSPITAL_COMMUNITY)
Admission: RE | Admit: 2017-04-05 | Discharge: 2017-04-06 | Disposition: A | Discharge status: Home / Self Care | Payer: Medicare Other | Source: Ambulatory Visit | Attending: Internal Medicine | Admitting: Internal Medicine

## 2017-04-05 ENCOUNTER — Encounter (HOSPITAL_COMMUNITY): Payer: Self-pay | Admitting: *Deleted

## 2017-04-05 DIAGNOSIS — I509 Heart failure, unspecified: Secondary | ICD-10-CM | POA: Insufficient documentation

## 2017-04-05 DIAGNOSIS — Z006 Encounter for examination for normal comparison and control in clinical research program: Secondary | ICD-10-CM | POA: Insufficient documentation

## 2017-04-05 DIAGNOSIS — Z87891 Personal history of nicotine dependence: Secondary | ICD-10-CM | POA: Diagnosis not present

## 2017-04-05 DIAGNOSIS — I482 Chronic atrial fibrillation: Secondary | ICD-10-CM | POA: Insufficient documentation

## 2017-04-05 DIAGNOSIS — Z9981 Dependence on supplemental oxygen: Secondary | ICD-10-CM | POA: Insufficient documentation

## 2017-04-05 DIAGNOSIS — N183 Chronic kidney disease, stage 3 (moderate): Secondary | ICD-10-CM | POA: Diagnosis not present

## 2017-04-05 DIAGNOSIS — E785 Hyperlipidemia, unspecified: Secondary | ICD-10-CM | POA: Insufficient documentation

## 2017-04-05 DIAGNOSIS — M1990 Unspecified osteoarthritis, unspecified site: Secondary | ICD-10-CM | POA: Diagnosis not present

## 2017-04-05 DIAGNOSIS — Z951 Presence of aortocoronary bypass graft: Secondary | ICD-10-CM | POA: Diagnosis not present

## 2017-04-05 DIAGNOSIS — Z7951 Long term (current) use of inhaled steroids: Secondary | ICD-10-CM | POA: Insufficient documentation

## 2017-04-05 DIAGNOSIS — K219 Gastro-esophageal reflux disease without esophagitis: Secondary | ICD-10-CM | POA: Insufficient documentation

## 2017-04-05 DIAGNOSIS — I255 Ischemic cardiomyopathy: Secondary | ICD-10-CM | POA: Insufficient documentation

## 2017-04-05 DIAGNOSIS — R001 Bradycardia, unspecified: Secondary | ICD-10-CM | POA: Insufficient documentation

## 2017-04-05 DIAGNOSIS — I2510 Atherosclerotic heart disease of native coronary artery without angina pectoris: Secondary | ICD-10-CM | POA: Insufficient documentation

## 2017-04-05 DIAGNOSIS — E039 Hypothyroidism, unspecified: Secondary | ICD-10-CM | POA: Insufficient documentation

## 2017-04-05 DIAGNOSIS — Z7901 Long term (current) use of anticoagulants: Secondary | ICD-10-CM | POA: Insufficient documentation

## 2017-04-05 DIAGNOSIS — Z79899 Other long term (current) drug therapy: Secondary | ICD-10-CM | POA: Insufficient documentation

## 2017-04-05 DIAGNOSIS — I481 Persistent atrial fibrillation: Secondary | ICD-10-CM | POA: Insufficient documentation

## 2017-04-05 DIAGNOSIS — I130 Hypertensive heart and chronic kidney disease with heart failure and stage 1 through stage 4 chronic kidney disease, or unspecified chronic kidney disease: Secondary | ICD-10-CM | POA: Insufficient documentation

## 2017-04-05 DIAGNOSIS — I5022 Chronic systolic (congestive) heart failure: Secondary | ICD-10-CM | POA: Diagnosis not present

## 2017-04-05 DIAGNOSIS — J449 Chronic obstructive pulmonary disease, unspecified: Secondary | ICD-10-CM | POA: Insufficient documentation

## 2017-04-05 DIAGNOSIS — Z95810 Presence of automatic (implantable) cardiac defibrillator: Secondary | ICD-10-CM

## 2017-04-05 HISTORY — DX: Heart failure, unspecified: I50.9

## 2017-04-05 HISTORY — PX: ICD IMPLANT: EP1208

## 2017-04-05 HISTORY — DX: Presence of automatic (implantable) cardiac defibrillator: Z95.810

## 2017-04-05 LAB — PROTIME-INR
INR: 1.43
Prothrombin Time: 17.6 seconds — ABNORMAL HIGH (ref 11.4–15.2)

## 2017-04-05 LAB — SURGICAL PCR SCREEN
MRSA, PCR: POSITIVE — AB
STAPHYLOCOCCUS AUREUS: POSITIVE — AB

## 2017-04-05 SURGERY — ICD IMPLANT
Anesthesia: LOCAL

## 2017-04-05 MED ORDER — FENTANYL CITRATE (PF) 100 MCG/2ML IJ SOLN
INTRAMUSCULAR | Status: DC | PRN
  Administered 2017-04-05: 25 ug via INTRAVENOUS
  Administered 2017-04-05 (×3): 12.5 ug via INTRAVENOUS

## 2017-04-05 MED ORDER — HYPROMELL-GLYCERIN-NAPHAZOLINE 0.8-0.25-0.012 % OP SOLN: 1.0000 [drp] | Freq: Three times a day (TID) | OPHTHALMIC | Status: DC | PRN

## 2017-04-05 MED ORDER — HEPARIN (PORCINE) IN NACL 2-0.9 UNIT/ML-% IJ SOLN
INTRAMUSCULAR | Status: AC
  Filled 2017-04-05: qty 500

## 2017-04-05 MED ORDER — HEPARIN (PORCINE) IN NACL 2-0.9 UNIT/ML-% IJ SOLN
INTRAMUSCULAR | Status: AC | PRN
  Administered 2017-04-05: 500 mL

## 2017-04-05 MED ORDER — MIDAZOLAM HCL 5 MG/5ML IJ SOLN
INTRAMUSCULAR | Status: DC | PRN
  Administered 2017-04-05 (×5): 1 mg via INTRAVENOUS

## 2017-04-05 MED ORDER — VITAMIN D 1000 UNITS PO TABS
5000.0000 [IU] | ORAL_TABLET | Freq: Every day | ORAL | Status: DC
  Administered 2017-04-05 – 2017-04-06 (×2): 5000 [IU] via ORAL
  Filled 2017-04-05 (×2): qty 5

## 2017-04-05 MED ORDER — SPIRONOLACTONE 25 MG PO TABS
12.5000 mg | ORAL_TABLET | Freq: Every day | ORAL | Status: DC
  Administered 2017-04-05 – 2017-04-06 (×2): 12.5 mg via ORAL
  Filled 2017-04-05 (×2): qty 1

## 2017-04-05 MED ORDER — CEFAZOLIN SODIUM-DEXTROSE 2-4 GM/100ML-% IV SOLN
2.0000 g | INTRAVENOUS | Status: AC
Start: 2017-04-05 — End: 2017-04-05
  Administered 2017-04-05: 2 g via INTRAVENOUS

## 2017-04-05 MED ORDER — HYDRALAZINE HCL 50 MG PO TABS
50.0000 mg | ORAL_TABLET | Freq: Three times a day (TID) | ORAL | Status: DC
  Administered 2017-04-05 – 2017-04-06 (×4): 50 mg via ORAL
  Filled 2017-04-05 (×4): qty 1

## 2017-04-05 MED ORDER — FENTANYL CITRATE (PF) 100 MCG/2ML IJ SOLN
INTRAMUSCULAR | Status: AC
  Filled 2017-04-05: qty 2

## 2017-04-05 MED ORDER — CHLORHEXIDINE GLUCONATE 4 % EX LIQD
60.0000 mL | Freq: Once | CUTANEOUS | Status: DC
  Filled 2017-04-05: qty 60

## 2017-04-05 MED ORDER — ACETAMINOPHEN 500 MG PO TABS
1000.0000 mg | ORAL_TABLET | Freq: Two times a day (BID) | ORAL | Status: DC
  Administered 2017-04-05 – 2017-04-06 (×3): 1000 mg via ORAL
  Filled 2017-04-05 (×3): qty 2

## 2017-04-05 MED ORDER — MUPIROCIN 2 % EX OINT
TOPICAL_OINTMENT | CUTANEOUS | Status: AC
  Administered 2017-04-05: 06:00:00
  Filled 2017-04-05: qty 22

## 2017-04-05 MED ORDER — ADULT MULTIVITAMIN W/MINERALS CH
1.0000 | ORAL_TABLET | Freq: Every day | ORAL | Status: DC
  Administered 2017-04-05 – 2017-04-06 (×2): 1 via ORAL
  Filled 2017-04-05 (×2): qty 1

## 2017-04-05 MED ORDER — DOCUSATE SODIUM 100 MG PO CAPS
100.0000 mg | ORAL_CAPSULE | Freq: Every day | ORAL | Status: DC
  Administered 2017-04-05: 100 mg via ORAL
  Filled 2017-04-05 (×2): qty 1

## 2017-04-05 MED ORDER — SODIUM CHLORIDE 0.9 % IR SOLN
Status: AC
  Filled 2017-04-05: qty 2

## 2017-04-05 MED ORDER — MIDAZOLAM HCL 5 MG/5ML IJ SOLN
INTRAMUSCULAR | Status: AC
  Filled 2017-04-05: qty 5

## 2017-04-05 MED ORDER — FERROUS SULFATE 325 (65 FE) MG PO TABS
325.0000 mg | ORAL_TABLET | Freq: Two times a day (BID) | ORAL | Status: DC
  Administered 2017-04-05 – 2017-04-06 (×2): 325 mg via ORAL
  Filled 2017-04-05 (×2): qty 1

## 2017-04-05 MED ORDER — SACUBITRIL-VALSARTAN 97-103 MG PO TABS
1.0000 | ORAL_TABLET | Freq: Two times a day (BID) | ORAL | Status: DC
  Administered 2017-04-05 – 2017-04-06 (×3): 1 via ORAL
  Filled 2017-04-05 (×4): qty 1

## 2017-04-05 MED ORDER — SODIUM CHLORIDE 0.9 % IR SOLN
80.0000 mg | Status: AC
  Administered 2017-04-05: 80 mg
  Filled 2017-04-05: qty 2

## 2017-04-05 MED ORDER — IPRATROPIUM-ALBUTEROL 0.5-2.5 (3) MG/3ML IN SOLN
3.0000 mL | Freq: Three times a day (TID) | RESPIRATORY_TRACT | Status: DC | PRN
  Administered 2017-04-05: 3 mL via RESPIRATORY_TRACT
  Filled 2017-04-05: qty 3

## 2017-04-05 MED ORDER — CEFAZOLIN SODIUM-DEXTROSE 1-4 GM/50ML-% IV SOLN
1.0000 g | Freq: Four times a day (QID) | INTRAVENOUS | Status: AC
  Administered 2017-04-05 – 2017-04-06 (×3): 1 g via INTRAVENOUS
  Filled 2017-04-05 (×3): qty 50

## 2017-04-05 MED ORDER — FUROSEMIDE 20 MG PO TABS: 20.0000 mg | ORAL_TABLET | ORAL | Status: DC | PRN

## 2017-04-05 MED ORDER — BUDESONIDE 0.25 MG/2ML IN SUSP: 0.2500 mg | Freq: Three times a day (TID) | RESPIRATORY_TRACT | Status: DC | PRN

## 2017-04-05 MED ORDER — PANTOPRAZOLE SODIUM 40 MG PO TBEC
40.0000 mg | DELAYED_RELEASE_TABLET | Freq: Two times a day (BID) | ORAL | Status: DC
  Administered 2017-04-05 – 2017-04-06 (×3): 40 mg via ORAL
  Filled 2017-04-05 (×3): qty 1

## 2017-04-05 MED ORDER — LIDOCAINE HCL (PF) 1 % IJ SOLN
INTRAMUSCULAR | Status: AC
  Filled 2017-04-05: qty 60

## 2017-04-05 MED ORDER — LIDOCAINE HCL (PF) 1 % IJ SOLN
INTRAMUSCULAR | Status: DC | PRN
  Administered 2017-04-05: 45 mL

## 2017-04-05 MED ORDER — LORATADINE 10 MG PO TABS: 10.0000 mg | ORAL_TABLET | Freq: Every day | ORAL | Status: DC | PRN

## 2017-04-05 MED ORDER — ACETAMINOPHEN 325 MG PO TABS
325.0000 mg | ORAL_TABLET | ORAL | Status: DC | PRN
  Administered 2017-04-06: 325 mg via ORAL
  Filled 2017-04-05: qty 2

## 2017-04-05 MED ORDER — ALBUTEROL SULFATE (2.5 MG/3ML) 0.083% IN NEBU: 3.0000 mL | INHALATION_SOLUTION | Freq: Four times a day (QID) | RESPIRATORY_TRACT | Status: DC | PRN

## 2017-04-05 MED ORDER — CARVEDILOL 3.125 MG PO TABS
3.1250 mg | ORAL_TABLET | Freq: Two times a day (BID) | ORAL | Status: DC
Start: 2017-04-05 — End: 2017-04-06
  Administered 2017-04-05 – 2017-04-06 (×2): 3.125 mg via ORAL
  Filled 2017-04-05 (×2): qty 1

## 2017-04-05 MED ORDER — ONDANSETRON HCL 4 MG/2ML IJ SOLN: 4.0000 mg | Freq: Four times a day (QID) | INTRAMUSCULAR | Status: DC | PRN

## 2017-04-05 MED ORDER — SODIUM CHLORIDE 0.9 % IV SOLN
INTRAVENOUS | Status: DC
  Administered 2017-04-05: 06:00:00 via INTRAVENOUS

## 2017-04-05 MED ORDER — LEVOTHYROXINE SODIUM 112 MCG PO TABS
112.0000 ug | ORAL_TABLET | Freq: Every day | ORAL | Status: DC
Start: 2017-04-05 — End: 2017-04-06
  Administered 2017-04-06: 112 ug via ORAL
  Filled 2017-04-05 (×2): qty 1

## 2017-04-05 MED ORDER — WARFARIN - PHYSICIAN DOSING INPATIENT
Freq: Every day | Status: DC
  Administered 2017-04-05: 18:00:00

## 2017-04-05 MED ORDER — NITROGLYCERIN 0.4 MG SL SUBL: 0.4000 mg | SUBLINGUAL_TABLET | SUBLINGUAL | Status: DC | PRN

## 2017-04-05 MED ORDER — WARFARIN SODIUM 2.5 MG PO TABS
2.5000 mg | ORAL_TABLET | Freq: Once | ORAL | Status: AC
  Administered 2017-04-05: 2.5 mg via ORAL
  Filled 2017-04-05: qty 1

## 2017-04-05 MED ORDER — CEFAZOLIN SODIUM-DEXTROSE 2-4 GM/100ML-% IV SOLN
INTRAVENOUS | Status: AC
  Filled 2017-04-05: qty 100

## 2017-04-05 SURGICAL SUPPLY — 12 items
CABLE SURGICAL S-101-97-12 (CABLE) ×2 IMPLANT
CATH RIGHTSITE C315HIS02 (CATHETERS) ×2 IMPLANT
ICD VIGILANT DR D233 (Pacemaker) ×2 IMPLANT
LEAD RELIANCE G DF4 0293 (Lead) ×1 IMPLANT
LEAD SELECT SECURE 3830 383069 (Lead) IMPLANT
PAD DEFIB LIFELINK (PAD) ×2 IMPLANT
SELECT SECURE 3830 383069 (Lead) ×2 IMPLANT
SHEATH CLASSIC 7F (SHEATH) ×2 IMPLANT
SHEATH CLASSIC 9F (SHEATH) ×1 IMPLANT
SLITTER 6232ADJ (MISCELLANEOUS) ×2 IMPLANT
TRAY PACEMAKER INSERTION (PACKS) ×1 IMPLANT
WIRE HI TORQ VERSACORE-J 145CM (WIRE) ×1 IMPLANT

## 2017-04-05 NOTE — H&P (View-Only) (Signed)
HPI [name] [name] is a pleasant 76yo man with a h/o chronic systolic hear failure class 2, EF 25%, an ICM, s/p CABG, atrial fib, HTN, chronic renal insufficiency, stage 3, and COPD. The patient has never had syncope. He has been managed with maximal medical therapy. He has class 2 symptoms. He appears to be relegated to rate control strategy. No Known Allergies   Current Outpatient Prescriptions  Medication Sig Dispense Refill  . acetaminophen (TYLENOL) 500 MG tablet Take 1,000 mg by mouth every 6 (six) hours as needed for mild pain. For pain.    . budesonide (PULMICORT) 0.25 MG/2ML nebulizer solution Take 0.25 mg by nebulization 2 (two) times daily.     . Cholecalciferol (VITAMIN D3) 5000 UNITS CAPS Take 5,000 Units by mouth daily.     Marland Kitchen docusate sodium (COLACE) 100 MG capsule Take 100 mg by mouth 2 (two) times daily as needed for mild constipation.    Marland Kitchen ENTRESTO 97-103 MG TAKE 1 TABLET BY MOUTH 2 (TWO) TIMES DAILY. 60 tablet 3  . ferrous sulfate 325 (65 FE) MG tablet Take 325 mg by mouth 2 (two) times daily.    . furosemide (LASIX) 40 MG tablet Take 0.5 tablets (20 mg total) by mouth as needed (if weight up 3 lbs overnight or 4 lb in a week take 20 mg for 3 days). 30 tablet   . hydrALAZINE (APRESOLINE) 50 MG tablet Take 1 tablet (50 mg total) by mouth 3 (three) times daily. 90 tablet 6  . ipratropium-albuterol (DUONEB) 0.5-2.5 (3) MG/3ML SOLN Take 3 mLs by nebulization every 6 (six) hours as needed. For shortness of breath.     . levothyroxine (SYNTHROID, LEVOTHROID) 112 MCG tablet TAKE 1 TABLET BY MOUTH  DAILY BEFORE BREAKFAST 90 tablet 2  . loratadine (CLARITIN) 10 MG tablet Take 10 mg by mouth daily after supper.    . lovastatin (MEVACOR) 40 MG tablet Take 2 tablets (80 mg total) by mouth at bedtime. 180 tablet 3  . Multiple Vitamins-Minerals (MULTIVITAMINS THER. W/MINERALS) TABS Take 1 tablet by mouth daily.      . nitroGLYCERIN (NITROSTAT) 0.4 MG SL tablet Place 1 tablet (0.4 mg  total) under the tongue every 5 (five) minutes as needed for chest pain. 6 tablet 1  . pantoprazole (PROTONIX) 40 MG tablet Take 1 tablet (40 mg total) by mouth 2 (two) times daily. 180 tablet 3  . PROAIR HFA 108 (90 Base) MCG/ACT inhaler Use 2 puffs every 6 hours  as needed for shortness of  breath 34 g 3  . sucralfate (CARAFATE) 1 g tablet Take 1 tablet (1 g total) by mouth 4 (four) times daily -  with meals and at bedtime. 360 tablet 5  . warfarin (COUMADIN) 5 MG tablet TAKE 1 TABLET BY MOUTH  EVERY DAY OR AS DIRECTED BY ANTICOAGULATION CLINIC 90 tablet 0  . spironolactone (ALDACTONE) 25 MG tablet TAKE 0.5 TABLETS (12.5 MG TOTAL) BY MOUTH DAILY. 15 tablet 3   No current facility-administered medications for this visit.      Past Medical History:  Diagnosis Date  . Anemia   . Arthritis    "hips; back" (07/09/2014)  . Asthma   . Atrial fibrillation ([place])   . CAD (coronary artery disease)   . Cholecystitis 11/2013  . 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   . COPD (chronic obstructive pulmonary disease) (HCC)    on home oxygen, 2 liters at night10/2015  . Esophageal  reflux   . Gallstones   . Gastric antral vascular ectasia 2013  . Gout   . Hepatomegaly   . Hiatal hernia   . History of blood transfusion ~ 2012   "blood count dropped; had to get 3 units"  . Hyperlipidemia   . Hypothyroidism   . Other and unspecified coagulation defects   . Personal history of colonic polyps 05/29/2010   TUBULAR ADENOMA  . Unspecified essential hypertension     ROS:   All systems reviewed and negative except as noted in the HPI.   Past Surgical History:  Procedure Laterality Date  . CARDIAC CATHETERIZATION N/A 07/15/2016   Procedure: Right/Left Heart Cath and Coronary/Graft Angiography;  Surgeon: Belva Crome, MD;  Location: [facility];  Service: Cardiovascular;  Laterality: N/A;  . cholecystomy tube  12/28/2014 - 01/06/2015   tube clogged with debris, removed and IR unable to  place new tube.   . COLONOSCOPY  2013   Dr. Sharlett Iles: no polyps or evidence of active bleeding  . CORONARY ANGIOPLASTY WITH STENT PLACEMENT  ~ 2008; 07/08/2014   "1 + 3"  . CORONARY ARTERY BYPASS GRAFT  1998   CABG X4  . ESOPHAGOGASTRODUODENOSCOPY  2013   Dr. 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 ESOPHAGOGASTRODUODENOSCOPY N/A 12/12/2015   Procedure: ESOPHAGOGASTRODUODENOSCOPY (EGD);  Surgeon: Gatha Mayer, MD;  Location: [facility];  Service: Endoscopy;  Laterality: N/A;  . GIVENS CAPSULE STUDY  2013   Dr. Sharlett Iles: AVM at 27 and blood at 30 minutes beyond first duodenal image but not actual lesion seen. If persistent IDA, bleeding, recommend enteroscopy with ablation   . HERNIA REPAIR    . LEFT AND RIGHT HEART CATHETERIZATION WITH CORONARY/GRAFT ANGIOGRAM N/A 07/09/2014   Right and left heart cath, bare metal stent to SVG to RCA. Sinclair Grooms, MD;   . UMBILICAL HERNIA REPAIR  [phone]     Family History  Problem Relation Age of Onset  . Leukemia Mother   . Kidney disease Mother        kidney removed   . Heart attack Brother   . Cancer Father   . Heart disease Father   . Stomach cancer Sister   . Stomach cancer Sister   . Early death Brother   . Hypertension Unknown        family   . Colon cancer Neg Hx      Social History   Social History  . Marital status: Married    Spouse name: [name]  . Number of children: 2  . Years of education: N/A   Occupational History  . retired    Social History Main Topics  . Smoking status: Former Smoker    Packs/day: 1.00    Years: 42.00    Types: Cigarettes    Quit date: 12/16/1996  . Smokeless tobacco: Never Used  . Alcohol use 0.6 oz/week    1 Cans of beer per week     Comment: 1 per week  . Drug use: No  . Sexual activity: No   Other Topics Concern  . Not on file   Social History Narrative  . No narrative on file     BP (!) 148/52   Pulse 74   Ht 5\' 10"   (1.778 m)   Wt 174 lb 3.2 oz (79 kg)   BMI 25.00 kg/m   Physical Exam:  Well appearing 76 yo man, NAD HEENT: Unremarkable Neck:  6 cm JVD, no thyromegally Lymphatics:  No adenopathy Back:  No CVA tenderness Lungs:  Clear with no wheezes HEART:  IRegular rate rhythm, no murmurs, no rubs, no clicks Abd:  soft, positive bowel sounds, no organomegally, no rebound, no guarding Ext:  2 plus pulses, no edema, no cyanosis, no clubbing Skin:  No rashes no nodules Neuro:  CN II through XII intact, motor grossly intact  EKG - atrial fib with a controllled vs irregular ventricular response   DEVICE  Normal device function.  See PaceArt for details. none  Assess/Plan: 1. ICM - his EF is 30 by echo.  We discussed insertion of an ICD. He wishes to proceed.  Will schedule as he is able.  2 atrial fib - unlikely that his LV dysfunction is related to a rapid VR but will plan to uptitrate his meds.  3.HTN - his blood pressure is not well controlled. We will follow and encourage d alow sodium diet.  Cristopher Peru, M.D.

## 2017-04-05 NOTE — Discharge Summary (Signed)
ELECTROPHYSIOLOGY PROCEDURE DISCHARGE SUMMARY   This note prepared by both Chanetta Marshall, NP and Cecilie Kicks, NP  Patient ID: [name] [name],  MRN: [id number], DOB/AGE: 1941/08/29 76 y.o.  Admit date: 04/05/2017 Discharge date: 04/06/2017  Primary Care Physician: Claretta Fraise, MD Primary Cardiologist: Tamala Julian Heart Failure: Aundra Dubin Electrophysiologist: Lovena Le  Primary Discharge Diagnosis:  ICM s/p ICD implant this admission  Secondary Discharge Diagnosis:  1.  CAD s/p CABG 2.  Longstanding persistent atrial fibrillation 3.  HTN 4.  COPD - home O2 at night 5.  Bradycardia 6.  Hyperlipidemia 7.  CKD, stage III  No Known Allergies   Procedures This Admission:  1.  Implantation of a BSX dual chamber ICD on 04/05/17 by Dr Lovena Le.  See op note for full details. Of note, 3830 His Bundle lead is in RA port. There were no immediate post procedure complications. 2.  CXR on 04/06/17 demonstrated no pneumothorax status post device implantation.   Brief HPI: [name] [name] is a 76 y.o. male was referred to electrophysiology in the outpatient setting for consideration of ICD implantation.  Past medical history includes ICM, persistent atrial fibrillation, bradycardia.  The patient has persistent LV dysfunction despite guideline directed therapy.  Risks, benefits, and alternatives to ICD implantation were reviewed with the patient who wished to proceed.   Hospital Course:  The patient was admitted and underwent implantation of a BSX dual chamber ICD with details as outlined above. He was monitored on telemetry overnight which demonstrated Hiss Bundle Pacing at 60.  Left chest was without hematoma or ecchymosis.  The device was interrogated and found to be functioning normally.  CXR was obtained and demonstrated no pneumothorax status post device implantation.  Wound care, arm mobility, and restrictions were reviewed with the patient.  The patient was examined and considered stable for discharge  to home.   The patient's discharge medications include an ARB (Entresto) and beta blocker (Coreg - resumed at discharge with back up brady pacing in place).   This patients CHA2DS2-VASc Score and unadjusted Ischemic Stroke Rate (% per year) is equal to 9.7 % stroke rate/year from a score of 6 Above score calculated as 1 point each if present [CHF, HTN, DM, Vascular=MI/PAD/Aortic Plaque, Age if 65-74, or Male] Above score calculated as 2 points each if present [Age > 75, or Stroke/TIA/TE]  Warfarin will be resumed at discharge. Follow up with PCP for INR check in 1 week.   Physical Exam: Vitals:   04/05/17 0944 04/05/17 1936 04/05/17 2110 04/06/17 0430  BP: (!) 186/62  (!) 168/43 (!) 158/54  Pulse: 60  62 61  Resp: 18  19 17   Temp: 97.7 F (36.5 C)  98.2 F (36.8 C) 98.2 F (36.8 C)  TempSrc: Oral  Oral Oral  SpO2: 94% 97% 98% 98%  Weight:    171 lb 11.2 oz (77.9 kg)  Height:       Per Dr. Rosezetta Schlatter- The patient is well appearing, alert and oriented x 3 today.   HEENT: normocephalic, atraumatic; sclera clear, conjunctiva pink; hearing intact; oropharynx clear; neck supple  Lungs- Clear to ausculation bilaterally, normal work of breathing.  No wheezes, rales, rhonchi Heart- Regular rate and rhythm  GI- soft, non-tender, non-distended, bowel sounds present  Extremities- no clubbing, cyanosis, or edema  MS- no significant deformity or atrophy Skin- warm and dry, no rash or lesion, left chest without hematoma/ecchymosis Psych- euthymic mood, full affect Neuro- strength and sensation are intact  Labs:   Lab Results  Component Value Date   WBC 8.6 03/28/2017   HGB 12.4 (L) 03/28/2017   HCT 37.6 03/28/2017   MCV 99 (H) 03/28/2017   PLT 279 03/28/2017   No results for input(s): NA, K, CL, CO2, BUN, CREATININE, CALCIUM, PROT, BILITOT, ALKPHOS, ALT, AST, GLUCOSE in the last 168 hours.  Invalid input(s): LABALBU   INR 1.45 at discharge.   Discharge Medications:    Allergies as of 04/06/2017   No Known Allergies     Medication List    TAKE these medications   acetaminophen 500 MG tablet Commonly known as:  TYLENOL Take 1,000 mg by mouth 2 (two) times daily. For pain.   budesonide 0.25 MG/2ML nebulizer solution Commonly known as:  PULMICORT Take 0.25 mg by nebulization 3 (three) times daily as needed (shortness of breath).   carvedilol 3.125 MG tablet Commonly known as:  COREG Take 1 tablet (3.125 mg total) by mouth 2 (two) times daily with a meal.   CLEAR EYES FOR DRY EYES PLUS 0.8-0.25-0.012 % Soln Generic drug:  Hypromell-Glycerin-Naphazoline Place 1-2 drops into both eyes 3 (three) times daily as needed (for dry eyes.).   docusate sodium 100 MG capsule Commonly known as:  COLACE Take 100 mg by mouth daily.   ENTRESTO 97-103 MG Generic drug:  sacubitril-valsartan TAKE 1 TABLET BY MOUTH 2 (TWO) TIMES DAILY.   ferrous sulfate 325 (65 FE) MG tablet Take 325 mg by mouth 2 (two) times daily.   furosemide 40 MG tablet Commonly known as:  LASIX Take 0.5 tablets (20 mg total) by mouth daily.   hydrALAZINE 50 MG tablet Commonly known as:  APRESOLINE Take 1 tablet (50 mg total) by mouth 3 (three) times daily. What changed:  when to take this  additional instructions   ipratropium-albuterol 0.5-2.5 (3) MG/3ML Soln Commonly known as:  DUONEB Take 3 mLs by nebulization 3 (three) times daily as needed (for shortness of breath/wheezing.). For shortness of breath.   levothyroxine 112 MCG tablet Commonly known as:  SYNTHROID, LEVOTHROID TAKE 1 TABLET BY MOUTH  DAILY BEFORE BREAKFAST   loratadine 10 MG tablet Commonly known as:  CLARITIN Take 10 mg by mouth daily as needed (for allergies.).   lovastatin 40 MG tablet Commonly known as:  MEVACOR Take 2 tablets (80 mg total) by mouth at bedtime. What changed:  how much to take  when to take this   multivitamins ther. w/minerals Tabs tablet Take 1 tablet by mouth daily.    nitroGLYCERIN 0.4 MG SL tablet Commonly known as:  NITROSTAT Place 1 tablet (0.4 mg total) under the tongue every 5 (five) minutes as needed for chest pain.   pantoprazole 40 MG tablet Commonly known as:  PROTONIX Take 1 tablet (40 mg total) by mouth 2 (two) times daily.   PROAIR HFA 108 (90 Base) MCG/ACT inhaler Generic drug:  albuterol Use 2 puffs every 6 hours  as needed for shortness of  breath   spironolactone 25 MG tablet Commonly known as:  ALDACTONE TAKE 0.5 TABLETS (12.5 MG TOTAL) BY MOUTH DAILY.   sucralfate 1 g tablet Commonly known as:  CARAFATE Take 1 tablet (1 g total) by mouth 4 (four) times daily -  with meals and at bedtime. What changed:  when to take this  reasons to take this   Vitamin D3 5000 units Caps Take 5,000 Units by mouth daily.   warfarin 5 MG tablet Commonly known as:  COUMADIN TAKE 0.5 TABLET (2.5 MG)  BY MOUTH ON MONDAY, [place]. TAKE 1 TABLET (5 MG) BY MOUTH ON SUNDAY, TUESDAY, THURSDAY & SATURDAY. Start taking on:  04/07/2017       Disposition:   Follow-up Information    [facility] Follow up on 04/18/2017.   Specialty:  Cardiology Why:  at 9:30AM for wound check  Contact information: [address], [address] [place]       Evans Lance, MD Follow up on 07/08/2017.   Specialty:  Cardiology Why:  at [place] information: [place] [address] [place] [address] [phone]        Claretta Fraise, MD Follow up in 1 week(s).   Specialty:  Family Medicine Why:  for coumadin check  Contact information: [place] [address] [phone]          Heart Healthy Diet  Go to your family doctor for coumadin check on Monday to be evaluated.   Begin coumadin as before on Thursday 04/07/17.  Duration of Discharge Encounter: Greater than 30 minutes including physician time.  Signed, Chanetta Marshall, NP 04/06/2017 9:58  AM  EP Attending  Patient seen and examined. Agree with above. Interrogation of his ICD demonstrates normal function. He is His bundle pacing almost exclusively. He will continue his low dose coreg along with his other CHF meds and will have his dose of coreg increased under the direction of Dr. Aundra Dubin as an outpatient.   Mikle Bosworth.D.

## 2017-04-05 NOTE — Interval H&P Note (Signed)
History and Physical Interval Note:  04/05/2017 7:20 AM  [name] [name]  has presented today for surgery, with the diagnosis of systolic hf  The various methods of treatment have been discussed with the patient and family. After consideration of risks, benefits and other options for treatment, the patient has consented to  Procedure(s): ICD Implant (N/A) as a surgical intervention .  The patient's history has been reviewed, patient examined, no change in status, stable for surgery.  I have reviewed the patient's chart and labs.  Questions were answered to the patient's satisfaction.     Cristopher Peru

## 2017-04-06 ENCOUNTER — Encounter (HOSPITAL_COMMUNITY): Payer: Self-pay | Admitting: Cardiology

## 2017-04-06 ENCOUNTER — Ambulatory Visit (HOSPITAL_COMMUNITY): Payer: Medicare Other

## 2017-04-06 DIAGNOSIS — I5022 Chronic systolic (congestive) heart failure: Secondary | ICD-10-CM | POA: Diagnosis not present

## 2017-04-06 DIAGNOSIS — I481 Persistent atrial fibrillation: Secondary | ICD-10-CM | POA: Diagnosis not present

## 2017-04-06 DIAGNOSIS — Z951 Presence of aortocoronary bypass graft: Secondary | ICD-10-CM | POA: Diagnosis not present

## 2017-04-06 DIAGNOSIS — I482 Chronic atrial fibrillation: Secondary | ICD-10-CM | POA: Diagnosis not present

## 2017-04-06 DIAGNOSIS — M1990 Unspecified osteoarthritis, unspecified site: Secondary | ICD-10-CM | POA: Diagnosis not present

## 2017-04-06 DIAGNOSIS — Z7951 Long term (current) use of inhaled steroids: Secondary | ICD-10-CM | POA: Diagnosis not present

## 2017-04-06 DIAGNOSIS — Z79899 Other long term (current) drug therapy: Secondary | ICD-10-CM | POA: Diagnosis not present

## 2017-04-06 DIAGNOSIS — J449 Chronic obstructive pulmonary disease, unspecified: Secondary | ICD-10-CM | POA: Diagnosis not present

## 2017-04-06 DIAGNOSIS — I509 Heart failure, unspecified: Secondary | ICD-10-CM | POA: Diagnosis not present

## 2017-04-06 DIAGNOSIS — Z006 Encounter for examination for normal comparison and control in clinical research program: Secondary | ICD-10-CM | POA: Diagnosis not present

## 2017-04-06 DIAGNOSIS — I517 Cardiomegaly: Secondary | ICD-10-CM | POA: Diagnosis not present

## 2017-04-06 DIAGNOSIS — I255 Ischemic cardiomyopathy: Secondary | ICD-10-CM | POA: Diagnosis not present

## 2017-04-06 DIAGNOSIS — E039 Hypothyroidism, unspecified: Secondary | ICD-10-CM | POA: Diagnosis not present

## 2017-04-06 DIAGNOSIS — R001 Bradycardia, unspecified: Secondary | ICD-10-CM | POA: Diagnosis not present

## 2017-04-06 DIAGNOSIS — I130 Hypertensive heart and chronic kidney disease with heart failure and stage 1 through stage 4 chronic kidney disease, or unspecified chronic kidney disease: Secondary | ICD-10-CM | POA: Diagnosis not present

## 2017-04-06 DIAGNOSIS — E785 Hyperlipidemia, unspecified: Secondary | ICD-10-CM | POA: Diagnosis not present

## 2017-04-06 DIAGNOSIS — Z7901 Long term (current) use of anticoagulants: Secondary | ICD-10-CM | POA: Diagnosis not present

## 2017-04-06 DIAGNOSIS — I2510 Atherosclerotic heart disease of native coronary artery without angina pectoris: Secondary | ICD-10-CM | POA: Diagnosis not present

## 2017-04-06 DIAGNOSIS — Z9981 Dependence on supplemental oxygen: Secondary | ICD-10-CM | POA: Diagnosis not present

## 2017-04-06 DIAGNOSIS — N183 Chronic kidney disease, stage 3 (moderate): Secondary | ICD-10-CM | POA: Diagnosis not present

## 2017-04-06 DIAGNOSIS — Z87891 Personal history of nicotine dependence: Secondary | ICD-10-CM | POA: Diagnosis not present

## 2017-04-06 DIAGNOSIS — K219 Gastro-esophageal reflux disease without esophagitis: Secondary | ICD-10-CM | POA: Diagnosis not present

## 2017-04-06 LAB — PROTIME-INR
INR: 1.45
PROTHROMBIN TIME: 17.7 s — AB (ref 11.4–15.2)

## 2017-04-06 MED ORDER — WARFARIN SODIUM 5 MG PO TABS: ORAL_TABLET | ORAL | 0 refills | Status: DC

## 2017-04-06 MED ORDER — FUROSEMIDE 40 MG PO TABS: 20.0000 mg | ORAL_TABLET | Freq: Every day | ORAL | Status: DC

## 2017-04-06 MED ORDER — CARVEDILOL 3.125 MG PO TABS: 3.1250 mg | ORAL_TABLET | Freq: Two times a day (BID) | ORAL | 6 refills | Status: DC

## 2017-04-06 NOTE — Discharge Instructions (Signed)
° ° °  Supplemental Discharge Instructions for  Pacemaker/Defibrillator Patients  Activity No heavy lifting or vigorous activity with your left/right arm for 6 to 8 weeks.  Do not raise your left/right arm above your head for one week.  Gradually raise your affected arm as drawn below.           __       04/09/17                           04/10/17                     04/11/17                 04/12/17  NO DRIVING for    1 week ; you may begin driving on   8/67/67  .  WOUND CARE - Keep the wound area clean and dry.  Do not get this area wet for one week. No showers for one week; you may shower on  04/12/17   . - The tape/steri-strips on your wound will fall off; do not pull them off.  No bandage is needed on the site.  DO  NOT apply any creams, oils, or ointments to the wound area. - If you notice any drainage or discharge from the wound, any swelling or bruising at the site, or you develop a fever > 101? F after you are discharged home, call the office at once.  Special Instructions - You are still able to use cellular telephones; use the ear opposite the side where you have your pacemaker/defibrillator.  Avoid carrying your cellular phone near your device. - When traveling through airports, show security personnel your identification card to avoid being screened in the metal detectors.  Ask the security personnel to use the hand wand. - Avoid arc welding equipment, MRI testing (magnetic resonance imaging), TENS units (transcutaneous nerve stimulators).  Call the office for questions about other devices. - Avoid electrical appliances that are in poor condition or are not properly grounded. - Microwave ovens are safe to be near or to operate.  Additional information for defibrillator patients should your device go off: - If your device goes off ONCE and you feel fine afterward, notify the device clinic nurses. - If your device goes off ONCE and you do not feel well afterward, call 911. - If your  device goes off TWICE, call 911. - If your device goes off THREE times in one day, call 911.  DO NOT DRIVE YOURSELF OR A FAMILY MEMBER WITH A DEFIBRILLATOR TO THE HOSPITAL--CALL 911.  Heart Healthy Diet  Go to your family doctor for coumadin check on Monday to be evaluated.   Begin coumadin on 04/08/27

## 2017-04-07 ENCOUNTER — Encounter: Payer: Self-pay | Admitting: Cardiology

## 2017-04-07 ENCOUNTER — Telehealth: Payer: Self-pay | Admitting: Family Medicine

## 2017-04-07 NOTE — Telephone Encounter (Signed)
appt scheduled for Tuesday Pt notified

## 2017-04-11 ENCOUNTER — Telehealth: Payer: Self-pay | Admitting: Internal Medicine

## 2017-04-11 ENCOUNTER — Other Ambulatory Visit: Payer: Self-pay | Admitting: Internal Medicine

## 2017-04-11 ENCOUNTER — Ambulatory Visit: Payer: Medicare Other | Admitting: Internal Medicine

## 2017-04-11 NOTE — Telephone Encounter (Signed)
Called, spoke with pt. Reviewed hospital d/c instructions with pt. No heavy lifting or vigorous activity with your left/right arm for 6 to 8 weeks. Pt may shower tomorrow (7/10). Do not raise your left/right arm above your head for one week. Gradually raise your affected arm as drawn below.  Informed to please call our office with further questions or concerns.

## 2017-04-11 NOTE — Telephone Encounter (Signed)
[name] [name] is calling because he has some questions about a machine in which he worked out for years to Work out his legs . He wants to know if he can start working out tomorrow , also can he mow his lawn and can he go up stairs . It has been 8 days since his surgery? Please call .Marland Kitchen Thanks

## 2017-04-12 ENCOUNTER — Ambulatory Visit (INDEPENDENT_AMBULATORY_CARE_PROVIDER_SITE_OTHER): Payer: Medicare Other | Admitting: Pharmacist

## 2017-04-12 DIAGNOSIS — I482 Chronic atrial fibrillation, unspecified: Secondary | ICD-10-CM

## 2017-04-12 LAB — COAGUCHEK XS/INR WAIVED
INR: 1.4 — AB (ref 0.9–1.1)
PROTHROMBIN TIME: 17.2 s

## 2017-04-12 MED ORDER — SUCRALFATE 1 G PO TABS: 1.0000 g | ORAL_TABLET | Freq: Three times a day (TID) | ORAL | 0 refills | Status: DC

## 2017-04-12 MED ORDER — PANTOPRAZOLE SODIUM 40 MG PO TBEC: 40.0000 mg | DELAYED_RELEASE_TABLET | Freq: Two times a day (BID) | ORAL | 0 refills | Status: DC

## 2017-04-13 ENCOUNTER — Other Ambulatory Visit (HOSPITAL_COMMUNITY): Payer: Self-pay | Admitting: Cardiology

## 2017-04-13 MED ORDER — CARVEDILOL 3.125 MG PO TABS: 3.1250 mg | ORAL_TABLET | Freq: Two times a day (BID) | ORAL | 3 refills | Status: DC

## 2017-04-14 ENCOUNTER — Other Ambulatory Visit (HOSPITAL_COMMUNITY): Payer: Self-pay | Admitting: Internal Medicine

## 2017-04-14 MED ORDER — CARVEDILOL 3.125 MG PO TABS: 3.1250 mg | ORAL_TABLET | Freq: Two times a day (BID) | ORAL | 3 refills | Status: DC

## 2017-04-17 ENCOUNTER — Encounter (HOSPITAL_BASED_OUTPATIENT_CLINIC_OR_DEPARTMENT_OTHER): Payer: Medicare Other

## 2017-04-18 ENCOUNTER — Encounter: Payer: Self-pay | Admitting: Internal Medicine

## 2017-04-18 ENCOUNTER — Ambulatory Visit (INDEPENDENT_AMBULATORY_CARE_PROVIDER_SITE_OTHER): Payer: Medicare Other | Admitting: *Deleted

## 2017-04-18 DIAGNOSIS — R001 Bradycardia, unspecified: Secondary | ICD-10-CM | POA: Diagnosis not present

## 2017-04-18 DIAGNOSIS — I482 Chronic atrial fibrillation, unspecified: Secondary | ICD-10-CM

## 2017-04-18 DIAGNOSIS — I5022 Chronic systolic (congestive) heart failure: Secondary | ICD-10-CM

## 2017-04-18 LAB — CUP PACEART INCLINIC DEVICE CHECK
HighPow Impedance: 60 Ohm
Implantable Lead Implant Date: 20180703
Implantable Lead Location: 753860
Implantable Lead Model: 3830
Implantable Pulse Generator Implant Date: 20180703
Lead Channel Pacing Threshold Amplitude: 0.7 V
Lead Channel Sensing Intrinsic Amplitude: 22.2 mV
Lead Channel Setting Pacing Amplitude: 3.5 V
Lead Channel Setting Pacing Pulse Width: 0.4 ms
Lead Channel Setting Sensing Sensitivity: 0.5 mV
MDC IDC LEAD IMPLANT DT: 20180703
MDC IDC LEAD LOCATION: 753859
MDC IDC LEAD SERIAL: 433180
MDC IDC MSMT LEADCHNL RA IMPEDANCE VALUE: 469 Ohm
MDC IDC MSMT LEADCHNL RA PACING THRESHOLD AMPLITUDE: 0.4 V
MDC IDC MSMT LEADCHNL RA PACING THRESHOLD PULSEWIDTH: 1 ms
MDC IDC MSMT LEADCHNL RA SENSING INTR AMPL: 4.2 mV
MDC IDC MSMT LEADCHNL RV IMPEDANCE VALUE: 552 Ohm
MDC IDC MSMT LEADCHNL RV PACING THRESHOLD PULSEWIDTH: 0.4 ms
MDC IDC SESS DTM: 20180716040000
MDC IDC SET LEADCHNL RA PACING AMPLITUDE: 3.5 V
MDC IDC STAT BRADY RA PERCENT PACED: 86 %
MDC IDC STAT BRADY RV PERCENT PACED: 1 % — AB
Pulse Gen Serial Number: 533886

## 2017-04-18 NOTE — Progress Notes (Signed)
Wound check appointment. Steri-strips removed. Wound without redness or edema. Incision edges approximated, wound well healed. Normal device function. Thresholds, sensing, and impedances consistent with implant measurements. HIS Bundle lead in RA port- threshold 0.4V @ 73ms. Non-selective HIS capture at [facility] to 4V, HIS Selective capture noted at 3.5V down to loss of capture. HBP output programmed at 3.5V @ 40ms per GT. RV lead programmed at 3.5V @ 0.4 ms for safety until 3 month visit. Histogram distribution appropriate for patient and level of activity. No ventricular arrhythmias noted. Patient educated about wound care, arm mobility, lifting restrictions, shock plan. ROV with GT 07/08/17.

## 2017-04-25 ENCOUNTER — Ambulatory Visit (INDEPENDENT_AMBULATORY_CARE_PROVIDER_SITE_OTHER): Payer: Medicare Other | Admitting: Pharmacist Clinician (PhC)/ Clinical Pharmacy Specialist

## 2017-04-25 DIAGNOSIS — I482 Chronic atrial fibrillation, unspecified: Secondary | ICD-10-CM

## 2017-04-25 LAB — COAGUCHEK XS/INR WAIVED
INR: 2.2 — AB (ref 0.9–1.1)
Prothrombin Time: 25.9 s

## 2017-04-25 NOTE — Patient Instructions (Signed)
Anticoagulation Warfarin Dose Instructions as of 04/25/2017      Dorene Grebe Tue Wed Thu Fri Sat   New Dose 5 mg 2.5 mg 5 mg 2.5 mg 5 mg 2.5 mg 5 mg    Description    INR 2.2 today  Patient will add some high vitamin K foods to diet

## 2017-04-26 DIAGNOSIS — J449 Chronic obstructive pulmonary disease, unspecified: Secondary | ICD-10-CM | POA: Diagnosis not present

## 2017-04-27 DIAGNOSIS — J449 Chronic obstructive pulmonary disease, unspecified: Secondary | ICD-10-CM | POA: Diagnosis not present

## 2017-05-17 ENCOUNTER — Telehealth: Payer: Self-pay | Admitting: Internal Medicine

## 2017-05-17 NOTE — Telephone Encounter (Signed)
New message    1. Has your device fired? No  2. Is you device beeping? no  3. Are you experiencing draining or swelling at device site? Red spot at the spot of insertion  4. Are you calling to see if we received your device transmission? no  5. Have you passed out? no

## 2017-05-17 NOTE — Telephone Encounter (Signed)
Patient states that he believes a stitch may have come up at the incision site. Patient requested an appt with the DC. Patient asked to come to the office on Thursday bc his wife also has an appointment in [place] on that day. Appt made for Thursday @ 1200 per patient request. Patient verbalized understanding.

## 2017-05-19 ENCOUNTER — Ambulatory Visit (INDEPENDENT_AMBULATORY_CARE_PROVIDER_SITE_OTHER): Payer: Self-pay | Admitting: *Deleted

## 2017-05-19 DIAGNOSIS — I482 Chronic atrial fibrillation, unspecified: Secondary | ICD-10-CM

## 2017-05-19 NOTE — Progress Notes (Signed)
Patient presents to the office d/t ?stitch at his device incision site. Incision edges approximated. Wound well healed without redness or edema. Stitch was removed from mid incision. Site dressed with bacitracin ointment and band-aid. Patient was instructed to remove band aid in 3 days and to call if the site is red, hot, swollen, or draining. Patient and wife verbalized understanding.

## 2017-05-28 DIAGNOSIS — J449 Chronic obstructive pulmonary disease, unspecified: Secondary | ICD-10-CM | POA: Diagnosis not present

## 2017-05-31 ENCOUNTER — Other Ambulatory Visit: Payer: Self-pay | Admitting: Family Medicine

## 2017-06-13 ENCOUNTER — Other Ambulatory Visit (HOSPITAL_COMMUNITY): Payer: Self-pay | Admitting: Cardiology

## 2017-06-13 DIAGNOSIS — J449 Chronic obstructive pulmonary disease, unspecified: Secondary | ICD-10-CM | POA: Diagnosis not present

## 2017-06-17 ENCOUNTER — Other Ambulatory Visit: Payer: Self-pay

## 2017-06-17 ENCOUNTER — Ambulatory Visit (INDEPENDENT_AMBULATORY_CARE_PROVIDER_SITE_OTHER): Payer: Medicare Other | Admitting: Pharmacist Clinician (PhC)/ Clinical Pharmacy Specialist

## 2017-06-17 DIAGNOSIS — I480 Paroxysmal atrial fibrillation: Secondary | ICD-10-CM | POA: Diagnosis not present

## 2017-06-17 DIAGNOSIS — R0683 Snoring: Secondary | ICD-10-CM

## 2017-06-17 DIAGNOSIS — R001 Bradycardia, unspecified: Secondary | ICD-10-CM

## 2017-06-17 DIAGNOSIS — I482 Chronic atrial fibrillation, unspecified: Secondary | ICD-10-CM

## 2017-06-17 LAB — COAGUCHEK XS/INR WAIVED
INR: 1.9 — AB (ref 0.9–1.1)
INR: 1.9 — ABNORMAL HIGH (ref 0.9–1.1)
Prothrombin Time: 23.2 s
Prothrombin Time: 23.2 s

## 2017-06-17 LAB — HEMOGLOBIN, FINGERSTICK: HEMOGLOBIN: 12.3 g/dL — AB (ref 12.6–17.7)

## 2017-06-17 NOTE — Patient Instructions (Signed)
Anticoagulation Warfarin Dose Instructions as of 06/17/2017      Dorene Grebe Tue Wed Thu Fri Sat   New Dose 5 mg 2.5 mg 5 mg 2.5 mg 5 mg 2.5 mg 5 mg    Description   Continue taking warfarin the same way  INR 1.9 today

## 2017-06-28 DIAGNOSIS — J449 Chronic obstructive pulmonary disease, unspecified: Secondary | ICD-10-CM | POA: Diagnosis not present

## 2017-07-06 ENCOUNTER — Encounter: Payer: Self-pay | Admitting: Internal Medicine

## 2017-07-06 ENCOUNTER — Ambulatory Visit (INDEPENDENT_AMBULATORY_CARE_PROVIDER_SITE_OTHER): Payer: Medicare Other | Admitting: Internal Medicine

## 2017-07-06 VITALS — BP 154/72 | HR 61 | Ht 70.0 in | Wt 176.2 lb

## 2017-07-06 DIAGNOSIS — I482 Chronic atrial fibrillation, unspecified: Secondary | ICD-10-CM

## 2017-07-06 DIAGNOSIS — R9430 Abnormal result of cardiovascular function study, unspecified: Secondary | ICD-10-CM

## 2017-07-06 DIAGNOSIS — I5022 Chronic systolic (congestive) heart failure: Secondary | ICD-10-CM | POA: Diagnosis not present

## 2017-07-06 LAB — CUP PACEART INCLINIC DEVICE CHECK
HighPow Impedance: 78 Ohm
Implantable Lead Implant Date: 20180703
Implantable Lead Location: 753859
Implantable Lead Location: 753860
Implantable Lead Model: 3830
Implantable Lead Serial Number: 433180
Lead Channel Impedance Value: 481 Ohm
Lead Channel Impedance Value: 643 Ohm
Lead Channel Pacing Threshold Amplitude: 1.2 V
Lead Channel Pacing Threshold Pulse Width: 0.4 ms
Lead Channel Setting Pacing Amplitude: 2.4 V
Lead Channel Setting Pacing Amplitude: 2.5 V
Lead Channel Setting Pacing Pulse Width: 0.4 ms
MDC IDC LEAD IMPLANT DT: 20180703
MDC IDC MSMT LEADCHNL RA PACING THRESHOLD PULSEWIDTH: 1 ms
MDC IDC MSMT LEADCHNL RV PACING THRESHOLD AMPLITUDE: 0.6 V
MDC IDC MSMT LEADCHNL RV SENSING INTR AMPL: 16.3 mV
MDC IDC PG IMPLANT DT: 20180703
MDC IDC SESS DTM: 20181003040000
MDC IDC SET LEADCHNL RV SENSING SENSITIVITY: 0.5 mV
Pulse Gen Serial Number: 533886

## 2017-07-06 NOTE — Progress Notes (Signed)
HPI [name] returns today for ongoing evaluation and management of his chronic systolic heart failure, chronic atrial fibrillation, coronary artery disease, renal insufficiency, status post ICD implantation. In the interim, the patient is been stable. He is able to play golf but notes the day after he plays that he is more tired. After physical activity he takes about 24 hours before his energy improved. He denies shortness of breath or chest pain. No peripheral edema. No ICD shocks. No Known Allergies   Current Outpatient Prescriptions  Medication Sig Dispense Refill  . acetaminophen (TYLENOL) 500 MG tablet Take 1,000 mg by mouth 2 (two) times daily. For pain.     . budesonide (PULMICORT) 0.25 MG/2ML nebulizer solution Take 0.25 mg by nebulization 3 (three) times daily as needed (shortness of breath).     . carvedilol (COREG) 3.125 MG tablet Take 1 tablet (3.125 mg total) by mouth 2 (two) times daily with a meal. 180 tablet 3  . Cholecalciferol (VITAMIN D3) 5000 UNITS CAPS Take 5,000 Units by mouth daily.     Marland Kitchen docusate sodium (COLACE) 100 MG capsule Take 100 mg by mouth daily.     Marland Kitchen ENTRESTO 97-103 MG TAKE 1 TABLET BY MOUTH TWICE A DAY 60 tablet 3  . ferrous sulfate 325 (65 FE) MG tablet Take 325 mg by mouth 2 (two) times daily.    . furosemide (LASIX) 40 MG tablet Take 0.5 tablets (20 mg total) by mouth daily.    . hydrALAZINE (APRESOLINE) 50 MG tablet Take 1 tablet (50 mg total) by mouth 3 (three) times daily. (Patient taking differently: Take 50 mg by mouth See admin instructions. Patient alternates with taking 1 tablet twice daily, alternating with 1 tablet three times daily) 90 tablet 6  . Hypromell-Glycerin-Naphazoline (CLEAR EYES FOR DRY EYES PLUS) 0.8-0.25-0.012 % SOLN Place 1-2 drops into both eyes 3 (three) times daily as needed (for dry eyes.).    Marland Kitchen ipratropium-albuterol (DUONEB) 0.5-2.5 (3) MG/3ML SOLN Take 3 mLs by nebulization 3 (three) times daily as needed (for  shortness of breath/wheezing.). For shortness of breath.     . levothyroxine (SYNTHROID, LEVOTHROID) 112 MCG tablet TAKE 1 TABLET BY MOUTH  DAILY BEFORE BREAKFAST 90 tablet 2  . loratadine (CLARITIN) 10 MG tablet Take 10 mg by mouth daily as needed (for allergies.).     Marland Kitchen lovastatin (MEVACOR) 40 MG tablet Take 2 tablets (80 mg total) by mouth at bedtime. (Patient taking differently: Take 40 mg by mouth 2 (two) times daily. ) 180 tablet 3  . Multiple Vitamins-Minerals (MULTIVITAMINS THER. W/MINERALS) TABS Take 1 tablet by mouth daily.      . nitroGLYCERIN (NITROSTAT) 0.4 MG SL tablet Place 1 tablet (0.4 mg total) under the tongue every 5 (five) minutes as needed for chest pain. 6 tablet 1  . pantoprazole (PROTONIX) 40 MG tablet TAKE 1 TABLET BY MOUTH TWO  TIMES DAILY 180 tablet 0  . PROAIR HFA 108 (90 Base) MCG/ACT inhaler Use 2 puffs every 6 hours  as needed for shortness of  breath 34 g 3  . spironolactone (ALDACTONE) 25 MG tablet TAKE 0.5 TABLETS (12.5 MG TOTAL) BY MOUTH DAILY. 15 tablet 3  . sucralfate (CARAFATE) 1 g tablet TAKE 1 TABLET BY MOUTH 4  TIMES DAILY WITH MEALS AND  AT BEDTIME 360 tablet 0  . warfarin (COUMADIN) 5 MG tablet TAKE 0.5 TABLET (2.5 MG) BY MOUTH ON MONDAY, WEDNESDAY, & FRIDAY. TAKE 1 TABLET (5 MG) BY MOUTH ON  SUNDAY, [place], [place]. 90 tablet 0   No current facility-administered medications for this visit.      Past Medical History:  Diagnosis Date  . 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 Anemia   . Arthritis    "hips; back" (07/09/2014)  . Asthma   . Atrial fibrillation ([place])   . CAD (coronary artery disease)   . CHF (congestive heart failure) ([place])   . Cholecystitis 11/2013  . 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   . COPD (chronic obstructive pulmonary disease) ([facility])    on home oxygen, 2 liters at night10/2015  . Esophageal reflux   . Gallstones   . Gastric antral vascular ectasia 2013  . Gout   .  Hepatomegaly   . Hiatal hernia   . History of blood transfusion ~ 2012   "blood count dropped; had to get 3 units"  . Hyperlipidemia   . Hypothyroidism   . Other and unspecified coagulation defects   . Personal history of colonic polyps 05/29/2010   TUBULAR ADENOMA  . Unspecified essential hypertension     ROS:   All systems reviewed and negative except as noted in the HPI.   Past Surgical History:  Procedure Laterality Date  . CARDIAC CATHETERIZATION N/A 07/15/2016   Procedure: Right/Left Heart Cath and Coronary/Graft Angiography;  Surgeon: Belva Crome, MD;  Location: [facility];  Service: Cardiovascular;  Laterality: N/A;  . cholecystomy tube  12/28/2014 - 01/06/2015   tube clogged with debris, removed and IR unable to place new tube.   . COLONOSCOPY  2013   Dr. Sharlett Iles: no polyps or evidence of active bleeding  . CORONARY ANGIOPLASTY WITH STENT PLACEMENT  ~ 2008; 07/08/2014   "1 + 3"  . CORONARY ARTERY BYPASS GRAFT  1998   CABG X4  . ESOPHAGOGASTRODUODENOSCOPY  2013   Dr. 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 ESOPHAGOGASTRODUODENOSCOPY N/A 12/12/2015   Procedure: ESOPHAGOGASTRODUODENOSCOPY (EGD);  Surgeon: Gatha Mayer, MD;  Location: [facility];  Service: Endoscopy;  Laterality: N/A;  . GIVENS CAPSULE STUDY  2013   Dr. Sharlett Iles: AVM at 3 and blood at 30 minutes beyond first duodenal image but not actual lesion seen. If persistent IDA, bleeding, recommend enteroscopy with ablation   . HERNIA REPAIR    . ICD IMPLANT N/A 04/05/2017   Procedure: ICD Implant;  Surgeon: Evans Lance, MD;  Location: [facility];  Service: Cardiovascular;  Laterality: N/A;  . LEFT AND RIGHT HEART CATHETERIZATION WITH CORONARY/GRAFT ANGIOGRAM N/A 07/09/2014   Right and left heart cath, bare metal stent to SVG to RCA. Sinclair Grooms, MD;   . UMBILICAL HERNIA REPAIR  [phone]     Family History  Problem Relation Age of Onset    . Leukemia Mother   . Kidney disease Mother        kidney removed   . Heart attack Brother   . Cancer Father   . Heart disease Father   . Stomach cancer Sister   . Stomach cancer Sister   . Early death Brother   . Hypertension Unknown        family   . Colon cancer Neg Hx      Social History   Social History  . Marital status: Married    Spouse name: [name]  . Number of children: 2  . Years of education: N/A   Occupational History  . retired  Social History Main Topics  . Smoking status: Former Smoker    Packs/day: 1.00    Years: 42.00    Types: Cigarettes    Quit date: 12/16/1996  . Smokeless tobacco: Never Used  . Alcohol use 0.6 oz/week    1 Cans of beer per week     Comment: 1 per week  . Drug use: No  . Sexual activity: No   Other Topics Concern  . Not on file   Social History Narrative  . No narrative on file     BP (!) 154/72   Pulse 61   Ht 5\' 10"  (1.778 m)   Wt 176 lb 3.2 oz (79.9 kg)   SpO2 95%   BMI 25.28 kg/m   Physical Exam:  Well appearing 76 year old man, NAD HEENT: Unremarkable Neck:  No JVD, no thyromegally Lymphatics:  No adenopathy Back:  No CVA tenderness Lungs:  Clear, with no wheezes, rales, or rhonchi. Well-healed ICD incision HEART:  Regular rate rhythm, no murmurs, no rubs, no clicks Abd:  soft, positive bowel sounds, no organomegally, no rebound, no guarding Ext:  2 plus pulses, no edema, no cyanosis, no clubbing Skin:  No rashes no nodules Neuro:  CN II through XII intact, motor grossly intact  EKG - atrial fibrillation with His bundle pacing present  DEVICE  Normal device function.  See PaceArt for details.   Assess/Plan: 1. Chronic systolic heart failure - he is status post ICD implantation. His heart failure symptoms remain class II. 2. Ischemic cardiomyopathy - despite fairly vigorous physical activity, the patient denies anginal symptoms. He will continue his current medications. 3. ICD - his [place] dual-chamber ICD is working normally. The defibrillator lead is in the right ventricle and the atrial lead is in the his bundle attached to the atrial port and is ventricular pacing. He appears to be tolerating this well. 4. Atrial fibrillation - he will continue his systemic anticoagulation. His ventricular rate is well controlled.  Cristopher Peru, M.D.

## 2017-07-06 NOTE — Patient Instructions (Signed)
Medication Instructions:  Your physician recommends that you continue on your current medications as directed. Please refer to the Current Medication list given to you today.  Labwork: None ordered.  Testing/Procedures: None ordered.  Follow-Up: Your physician wants you to follow-up in: 9 months with Dr. Taylor.   You will receive a reminder letter in the mail two months in advance. If you don't receive a letter, please call our office to schedule the follow-up appointment.  Remote monitoring is used to monitor your ICD from home. This monitoring reduces the number of office visits required to check your device to one time per year. It allows us to keep an eye on the functioning of your device to ensure it is working properly. You are scheduled for a device check from home on 10/05/2016. You may send your transmission at any time that day. If you have a wireless device, the transmission will be sent automatically. After your physician reviews your transmission, you will receive a postcard with your next transmission date.   Any Other Special Instructions Will Be Listed Below (If Applicable).   If you need a refill on your cardiac medications before your next appointment, please call your pharmacy.   

## 2017-07-08 ENCOUNTER — Encounter: Payer: Medicare Other | Admitting: Internal Medicine

## 2017-07-20 ENCOUNTER — Ambulatory Visit (INDEPENDENT_AMBULATORY_CARE_PROVIDER_SITE_OTHER): Payer: Medicare Other

## 2017-07-20 DIAGNOSIS — Z23 Encounter for immunization: Secondary | ICD-10-CM

## 2017-07-27 DIAGNOSIS — J449 Chronic obstructive pulmonary disease, unspecified: Secondary | ICD-10-CM | POA: Diagnosis not present

## 2017-07-28 DIAGNOSIS — J449 Chronic obstructive pulmonary disease, unspecified: Secondary | ICD-10-CM | POA: Diagnosis not present

## 2017-08-08 ENCOUNTER — Other Ambulatory Visit (HOSPITAL_COMMUNITY): Payer: Self-pay | Admitting: Internal Medicine

## 2017-08-12 ENCOUNTER — Ambulatory Visit (INDEPENDENT_AMBULATORY_CARE_PROVIDER_SITE_OTHER): Payer: Medicare Other | Admitting: Pharmacist Clinician (PhC)/ Clinical Pharmacy Specialist

## 2017-08-12 DIAGNOSIS — I482 Chronic atrial fibrillation, unspecified: Secondary | ICD-10-CM

## 2017-08-12 LAB — COAGUCHEK XS/INR WAIVED
INR: 1.6 — ABNORMAL HIGH (ref 0.9–1.1)
Prothrombin Time: 19.2 s

## 2017-08-12 NOTE — Patient Instructions (Signed)
Description   Continue taking warfarin the same way except for a whole tablet today.  Goal is 1.8-2.2  INR 1.6  today

## 2017-08-28 DIAGNOSIS — J449 Chronic obstructive pulmonary disease, unspecified: Secondary | ICD-10-CM | POA: Diagnosis not present

## 2017-08-30 DIAGNOSIS — J449 Chronic obstructive pulmonary disease, unspecified: Secondary | ICD-10-CM | POA: Diagnosis not present

## 2017-09-16 ENCOUNTER — Other Ambulatory Visit (HOSPITAL_COMMUNITY): Payer: Self-pay | Admitting: *Deleted

## 2017-09-16 ENCOUNTER — Other Ambulatory Visit (HOSPITAL_COMMUNITY): Payer: Self-pay | Admitting: Pharmacist

## 2017-09-16 MED ORDER — SACUBITRIL-VALSARTAN 97-103 MG PO TABS: 1.0000 | ORAL_TABLET | Freq: Two times a day (BID) | ORAL | 3 refills | Status: DC

## 2017-09-16 NOTE — Telephone Encounter (Signed)
Spoke with [name] [name] who stated that since [facility] no longer available, they were able to get approval for [employer] patient assistance coverage of [name] but need an Rx sent to [facility] to fill (patient contact # [phone]). Will send electronically today.   Ruta Hinds. Velva Harman, PharmD, BCPS, CPP Clinical Pharmacist Pager: [phone] Phone: [phone] 09/16/2017 2:57 PM

## 2017-09-20 ENCOUNTER — Other Ambulatory Visit: Payer: Self-pay | Admitting: Family Medicine

## 2017-09-20 ENCOUNTER — Other Ambulatory Visit (HOSPITAL_COMMUNITY): Payer: Self-pay | Admitting: Cardiology

## 2017-09-22 ENCOUNTER — Other Ambulatory Visit (HOSPITAL_COMMUNITY): Payer: Self-pay | Admitting: *Deleted

## 2017-09-22 ENCOUNTER — Telehealth (HOSPITAL_COMMUNITY): Payer: Self-pay | Admitting: Pharmacist

## 2017-09-22 NOTE — Telephone Encounter (Signed)
Since Mr. Sweetland is in the "donut hole", will provide with samples of Entresto and sign him up with PANF after the first of the year.   Medication Samples have been provided to the patient.  Drug name: Delene Loll       Strength: 49-51 mg        Qty: 56  LOT: [id number]  Exp.Date: 10/2019  Dosing instructions: Take 2 tablets twice daily  The patient has been instructed regarding the correct time, dose, and frequency of taking this medication, including desired effects and most common side effects.    Ruta Hinds. Velva Harman, PharmD, BCPS, CPP Clinical Pharmacist Pager: [phone] Phone: [phone] 09/22/2017 2:39 PM

## 2017-09-23 ENCOUNTER — Ambulatory Visit (INDEPENDENT_AMBULATORY_CARE_PROVIDER_SITE_OTHER): Payer: Medicare Other | Admitting: Pharmacist Clinician (PhC)/ Clinical Pharmacy Specialist

## 2017-09-23 DIAGNOSIS — I482 Chronic atrial fibrillation, unspecified: Secondary | ICD-10-CM

## 2017-09-23 LAB — HEMOGLOBIN, FINGERSTICK: Hemoglobin: 12.8 g/dL (ref 12.6–17.7)

## 2017-09-23 LAB — COAGUCHEK XS/INR WAIVED
INR: 2.2 — ABNORMAL HIGH (ref 0.9–1.1)
Prothrombin Time: 26.3 s

## 2017-09-23 NOTE — Patient Instructions (Signed)
Description   Continue taking warfarin the same way except for a whole tablet today.  Goal is 1.8-2.2  INR 2.2  today

## 2017-09-27 DIAGNOSIS — J449 Chronic obstructive pulmonary disease, unspecified: Secondary | ICD-10-CM | POA: Diagnosis not present

## 2017-09-28 DIAGNOSIS — J449 Chronic obstructive pulmonary disease, unspecified: Secondary | ICD-10-CM | POA: Diagnosis not present

## 2017-10-05 ENCOUNTER — Telehealth (HOSPITAL_COMMUNITY): Payer: Self-pay | Admitting: Pharmacist

## 2017-10-05 ENCOUNTER — Ambulatory Visit (INDEPENDENT_AMBULATORY_CARE_PROVIDER_SITE_OTHER): Payer: Medicare Other | Admitting: *Deleted

## 2017-10-05 ENCOUNTER — Telehealth: Payer: Self-pay | Admitting: Family Medicine

## 2017-10-05 DIAGNOSIS — I5022 Chronic systolic (congestive) heart failure: Secondary | ICD-10-CM | POA: Diagnosis not present

## 2017-10-05 NOTE — Telephone Encounter (Signed)
PANF renewed for Mr. Byron so that he will have $1000 to use toward his Delene Loll copay costs through 09/05/18.   Member ID: [id number] Group ID: [id number] RxBin ID: [id number] PCN: [id number] Eligibility Start Date: 09/06/2017 Eligibility End Date: 09/05/2018 Assistance Amount: $1,000.00   Doroteo Bradford K. Velva Harman, PharmD, BCPS, CPP Clinical Pharmacist Pager: [phone] Phone: [phone] 10/05/2017 2:29 PM

## 2017-10-05 NOTE — Telephone Encounter (Signed)
ntbs- apt made 1/15

## 2017-10-06 NOTE — Progress Notes (Signed)
Remote ICD transmission.   

## 2017-10-07 ENCOUNTER — Encounter: Payer: Self-pay | Admitting: Cardiology

## 2017-10-18 ENCOUNTER — Ambulatory Visit (INDEPENDENT_AMBULATORY_CARE_PROVIDER_SITE_OTHER): Payer: Medicare Other | Admitting: Family Medicine

## 2017-10-18 ENCOUNTER — Encounter: Payer: Self-pay | Admitting: Family Medicine

## 2017-10-18 VITALS — BP 131/47 | HR 59 | Temp 96.8°F | Ht 70.0 in | Wt 180.0 lb

## 2017-10-18 DIAGNOSIS — N183 Chronic kidney disease, stage 3 unspecified: Secondary | ICD-10-CM

## 2017-10-18 DIAGNOSIS — E782 Mixed hyperlipidemia: Secondary | ICD-10-CM

## 2017-10-18 DIAGNOSIS — N400 Enlarged prostate without lower urinary tract symptoms: Secondary | ICD-10-CM | POA: Diagnosis not present

## 2017-10-18 DIAGNOSIS — I10 Essential (primary) hypertension: Secondary | ICD-10-CM

## 2017-10-18 DIAGNOSIS — I5022 Chronic systolic (congestive) heart failure: Secondary | ICD-10-CM | POA: Diagnosis not present

## 2017-10-18 DIAGNOSIS — E038 Other specified hypothyroidism: Secondary | ICD-10-CM | POA: Diagnosis not present

## 2017-10-18 DIAGNOSIS — I482 Chronic atrial fibrillation, unspecified: Secondary | ICD-10-CM

## 2017-10-18 DIAGNOSIS — Z7901 Long term (current) use of anticoagulants: Secondary | ICD-10-CM | POA: Diagnosis not present

## 2017-10-18 DIAGNOSIS — D500 Iron deficiency anemia secondary to blood loss (chronic): Secondary | ICD-10-CM | POA: Diagnosis not present

## 2017-10-18 DIAGNOSIS — Z8719 Personal history of other diseases of the digestive system: Secondary | ICD-10-CM | POA: Diagnosis not present

## 2017-10-18 MED ORDER — SUCRALFATE 1 G PO TABS: 1.0000 g | ORAL_TABLET | Freq: Two times a day (BID) | ORAL | 1 refills | Status: DC

## 2017-10-18 MED ORDER — PANTOPRAZOLE SODIUM 40 MG PO TBEC: 40.0000 mg | DELAYED_RELEASE_TABLET | Freq: Two times a day (BID) | ORAL | 1 refills | Status: DC

## 2017-10-18 NOTE — Progress Notes (Signed)
Subjective:  Patient ID: [name] [name], male    DOB: 1941/04/22  Age: 77 y.o. MRN: [id number]  CC: Hypertension (pt here today for routine follow up of his chronic medical conditions)   HPI [name] [name] presents for recheck of cardiac conditions as well as thyroid, HTN and anemia. Says he feels better now than in quite some time. Had implanted defib placed last summer. Meds complicated. See below. Reviewed with pt. Following in coumadin clinic here.  Follow-up of hypertension. Patient has no history of headache or recent cough.  CHF symptoms have resolved with current treatment.  Patient also denies symptoms of TIA such as numbness weakness lateralizing. Patient checks  blood pressure at home and has not had any elevated readings recently. Patient denies side effects from his medication. States taking it regularly.  Patient in for follow-up of atrial fibrillation. Patient denies any recent bouts of chest pain or palpitations. Additionally, patient is taking anticoagulants. Patient denies any recent excessive bleeding episodes including epistaxis, bleeding from the gums, genitalia, rectal bleeding or hematuria. Additionally there has been no excessive bruising.  Patient in for follow-up of elevated cholesterol. Doing well without complaints on current medication. Denies side effects of statin including myalgia and arthralgia and nausea. Also in today for liver function testing. Currently no chest pain, shortness of breath or other cardiovascular related symptoms noted. Patient presents for follow-up on  thyroid. The patient has a history of hypothyroidism for many years. It has been stable recently. Pt. denies any change in  voice, loss of hair, heat or cold intolerance. Energy level has been adequate to good. Patient denies constipation and diarrhea. No myxedema. Medication is as noted below. Verified that pt is taking it daily on an empty stomach. Well tolerated. Depression screen [facility] 2/9  10/18/2017 03/02/2017 12/01/2016  Decreased Interest 0 0 0  Down, Depressed, Hopeless 0 0 0  PHQ - 2 Score 0 0 0  Altered sleeping - - -  Tired, decreased energy - - -  Change in appetite - - -  Feeling bad or failure about yourself  - - -  Trouble concentrating - - -  Moving slowly or fidgety/restless - - -  Suicidal thoughts - - -  PHQ-9 Score - - -  Some recent data might be hidden    History [name] [name] has a past medical history of AICD (automatic cardioverter/defibrillator) present (04/05/2017), Anemia, Arthritis, Asthma, Atrial fibrillation ([place]), CAD (coronary artery disease), CHF (congestive heart failure) ([place]), Cholecystitis (11/2013), CKD (chronic kidney disease) (2015), COPD (chronic obstructive pulmonary disease) ([place]), Esophageal reflux, Gallstones, Gastric antral vascular ectasia (2013), Gout, Hepatomegaly, Hiatal hernia, History of blood transfusion (~ 2012), Hyperlipidemia, Hypothyroidism, Other and unspecified coagulation defects, Personal history of colonic polyps (05/29/2010), and Unspecified essential hypertension.   He has a past surgical history that includes Coronary angioplasty with stent (~ 2008; 07/08/2014); Coronary artery bypass graft (1998); Hernia repair; Umbilical hernia repair (1970's); left and right heart catheterization with coronary/graft angiogram (N/A, 07/09/2014); Colonoscopy (2013); Esophagogastroduodenoscopy (2013); Givens capsule study (2013); cholecystomy tube (12/28/2014 - 01/06/2015); Esophagogastroduodenoscopy (N/A, 12/12/2015); Cardiac catheterization (N/A, 07/15/2016); and ICD IMPLANT (N/A, 04/05/2017).   His family history includes Cancer in his father; Early death in his brother; Heart attack in his brother; Heart disease in his father; Hypertension in his unknown relative; Kidney disease in his mother; Leukemia in his mother; Stomach cancer in his sister and sister.He reports that he quit smoking about 20 years ago. His smoking use included cigarettes. He has  a  42.00 pack-year smoking history. he has never used smokeless tobacco. He reports that he drinks about 0.6 oz of alcohol per week. He reports that he does not use drugs.    ROS Review of Systems  Constitutional: Negative for chills, diaphoresis, fever and unexpected weight change.  HENT: Negative for congestion, hearing loss, rhinorrhea and sore throat.   Eyes: Negative for visual disturbance.  Respiratory: Negative for cough and shortness of breath.   Cardiovascular: Negative for chest pain.  Gastrointestinal: Negative for abdominal pain, constipation and diarrhea.  Genitourinary: Negative for dysuria and flank pain.  Musculoskeletal: Negative for arthralgias and joint swelling.  Skin: Negative for rash.  Neurological: Negative for dizziness and headaches.  Psychiatric/Behavioral: Negative for dysphoric mood and sleep disturbance.    Objective:  BP (!) 131/47   Pulse (!) 59   Temp (!) 96.8 F (36 C) (Oral)   Ht '5\' 10"'$  (1.778 m)   Wt 180 lb (81.6 kg)   BMI 25.83 kg/m   BP Readings from Last 3 Encounters:  10/18/17 (!) 131/47  07/06/17 (!) 154/72  04/06/17 (!) 158/54    Wt Readings from Last 3 Encounters:  10/18/17 180 lb (81.6 kg)  07/06/17 176 lb 3.2 oz (79.9 kg)  04/06/17 171 lb 11.2 oz (77.9 kg)     Physical Exam  Constitutional: He is oriented to person, place, and time. He appears well-developed and well-nourished. No distress.  HENT:  Head: Normocephalic and atraumatic.  Right Ear: External ear normal.  Left Ear: External ear normal.  Nose: Nose normal.  Mouth/Throat: Oropharynx is clear and moist.  Eyes: Conjunctivae and EOM are normal. Pupils are equal, round, and reactive to light.  Neck: Normal range of motion. Neck supple. No thyromegaly present.  Cardiovascular: Normal rate, regular rhythm and normal heart sounds.  No murmur heard. Pulmonary/Chest: Effort normal and breath sounds normal. No respiratory distress. He has no wheezes. He has no rales.    Abdominal: Soft. Bowel sounds are normal. He exhibits no distension. There is no tenderness.  Lymphadenopathy:    He has no cervical adenopathy.  Neurological: He is alert and oriented to person, place, and time. He has normal reflexes.  Skin: Skin is warm and dry.  Psychiatric: He has a normal mood and affect. His behavior is normal. Judgment and thought content normal.      Assessment & Plan:   Kaid was seen today for hypertension.  Diagnoses and all orders for this visit:  Chronic systolic CHF (congestive heart failure) (HCC)  CKD (chronic kidney disease), stage III (HCC)  Essential hypertension -     CBC with Differential/Platelet -     CMP14+EGFR  Chronic anticoagulation  Hx of gastroesophageal reflux (GERD)  Atrial fibrillation, chronic (HCC)  Mixed hyperlipidemia -     Lipid panel  Iron deficiency anemia due to chronic blood loss  Other specified hypothyroidism -     TSH -     T4, Free  Benign prostatic hyperplasia, unspecified whether lower urinary tract symptoms present -     PSA, total and free  Other orders -     pantoprazole (PROTONIX) 40 MG tablet; Take 1 tablet (40 mg total) by mouth 2 (two) times daily. -     sucralfate (CARAFATE) 1 g tablet; Take 1 tablet (1 g total) by mouth 2 (two) times daily. With breakfast and supper.       I have discontinued Nazeer R. Lesueur "[name]"'s ENTRESTO. I have also changed his pantoprazole and  sucralfate. Additionally, I am having him maintain his ipratropium-albuterol, budesonide, multivitamins ther. w/minerals, Vitamin D3, acetaminophen, ferrous sulfate, docusate sodium, PROAIR HFA, loratadine, lovastatin, hydrALAZINE, nitroGLYCERIN, Hypromell-Glycerin-Naphazoline, furosemide, warfarin, carvedilol, spironolactone, sacubitril-valsartan, and levothyroxine.  Allergies as of 10/18/2017   No Known Allergies     Medication List        Accurate as of 10/18/17  5:17 PM. Always use your most recent med list.           acetaminophen 500 MG tablet Commonly known as:  TYLENOL Take 1,000 mg by mouth 2 (two) times daily. For pain.   budesonide 0.25 MG/2ML nebulizer solution Commonly known as:  PULMICORT Take 0.25 mg by nebulization 3 (three) times daily as needed (shortness of breath).   carvedilol 3.125 MG tablet Commonly known as:  COREG Take 1 tablet (3.125 mg total) by mouth 2 (two) times daily with a meal.   CLEAR EYES FOR DRY EYES PLUS 0.8-0.25-0.012 % Soln Generic drug:  Hypromell-Glycerin-Naphazoline Place 1-2 drops into both eyes 3 (three) times daily as needed (for dry eyes.).   docusate sodium 100 MG capsule Commonly known as:  COLACE Take 100 mg by mouth daily.   ferrous sulfate 325 (65 FE) MG tablet Take 325 mg by mouth 2 (two) times daily.   furosemide 40 MG tablet Commonly known as:  LASIX Take 0.5 tablets (20 mg total) by mouth daily.   hydrALAZINE 50 MG tablet Commonly known as:  APRESOLINE Take 1 tablet (50 mg total) by mouth 3 (three) times daily.   ipratropium-albuterol 0.5-2.5 (3) MG/3ML Soln Commonly known as:  DUONEB Take 3 mLs by nebulization 3 (three) times daily as needed (for shortness of breath/wheezing.). For shortness of breath.   levothyroxine 112 MCG tablet Commonly known as:  SYNTHROID, LEVOTHROID TAKE 1 TABLET BY MOUTH  DAILY BEFORE BREAKFAST   loratadine 10 MG tablet Commonly known as:  CLARITIN Take 10 mg by mouth daily as needed (for allergies.).   lovastatin 40 MG tablet Commonly known as:  MEVACOR Take 2 tablets (80 mg total) by mouth at bedtime.   multivitamins ther. w/minerals Tabs tablet Take 1 tablet by mouth daily.   nitroGLYCERIN 0.4 MG SL tablet Commonly known as:  NITROSTAT Place 1 tablet (0.4 mg total) under the tongue every 5 (five) minutes as needed for chest pain.   pantoprazole 40 MG tablet Commonly known as:  PROTONIX Take 1 tablet (40 mg total) by mouth 2 (two) times daily.   PROAIR HFA 108 (90 Base) MCG/ACT  inhaler Generic drug:  albuterol Use 2 puffs every 6 hours  as needed for shortness of  breath   sacubitril-valsartan 97-103 MG Commonly known as:  ENTRESTO Take 1 tablet by mouth 2 (two) times daily.   spironolactone 25 MG tablet Commonly known as:  ALDACTONE TAKE 0.5 TABLETS (12.5 MG TOTAL) BY MOUTH DAILY.   sucralfate 1 g tablet Commonly known as:  CARAFATE Take 1 tablet (1 g total) by mouth 2 (two) times daily. With breakfast and supper.   Vitamin D3 5000 units Caps Take 5,000 Units by mouth daily.   warfarin 5 MG tablet Commonly known as:  COUMADIN Take as directed by the anticoagulation clinic. If you are unsure how to take this medication, talk to your nurse or doctor. Original instructions:  TAKE 0.5 TABLET (2.5 MG) BY MOUTH ON MONDAY, WEDNESDAY, & FRIDAY. TAKE 1 TABLET (5 MG) BY MOUTH ON SUNDAY, TUESDAY, THURSDAY & SATURDAY.        Follow-up: Return in  about 3 months (around 01/16/2018).  Claretta Fraise, M.D.

## 2017-10-19 LAB — CBC WITH DIFFERENTIAL/PLATELET
BASOS: 1 %
Basophils Absolute: 0.1 10*3/uL (ref 0.0–0.2)
EOS (ABSOLUTE): 0.4 10*3/uL (ref 0.0–0.4)
Eos: 4 %
HEMATOCRIT: 38.8 % (ref 37.5–51.0)
Hemoglobin: 12.8 g/dL — ABNORMAL LOW (ref 13.0–17.7)
Immature Grans (Abs): 0 10*3/uL (ref 0.0–0.1)
Immature Granulocytes: 0 %
LYMPHS ABS: 1.2 10*3/uL (ref 0.7–3.1)
Lymphs: 11 %
MCH: 32.4 pg (ref 26.6–33.0)
MCHC: 33 g/dL (ref 31.5–35.7)
MCV: 98 fL — AB (ref 79–97)
MONOS ABS: 1 10*3/uL — AB (ref 0.1–0.9)
Monocytes: 10 %
NEUTROS ABS: 7.6 10*3/uL — AB (ref 1.4–7.0)
Neutrophils: 74 %
Platelets: 283 10*3/uL (ref 150–379)
RBC: 3.95 x10E6/uL — ABNORMAL LOW (ref 4.14–5.80)
RDW: 13.7 % (ref 12.3–15.4)
WBC: 10.3 10*3/uL (ref 3.4–10.8)

## 2017-10-19 LAB — CMP14+EGFR
A/G RATIO: 1.7 (ref 1.2–2.2)
ALK PHOS: 52 IU/L (ref 39–117)
ALT: 12 IU/L (ref 0–44)
AST: 22 IU/L (ref 0–40)
Albumin: 4.4 g/dL (ref 3.5–4.8)
BILIRUBIN TOTAL: 0.4 mg/dL (ref 0.0–1.2)
BUN/Creatinine Ratio: 18 (ref 10–24)
BUN: 24 mg/dL (ref 8–27)
CALCIUM: 9.3 mg/dL (ref 8.6–10.2)
CHLORIDE: 101 mmol/L (ref 96–106)
CO2: 22 mmol/L (ref 20–29)
Creatinine, Ser: 1.34 mg/dL — ABNORMAL HIGH (ref 0.76–1.27)
GFR calc Af Amer: 58 mL/min/{1.73_m2} — ABNORMAL LOW (ref >59)
GFR, EST NON AFRICAN AMERICAN: 50 mL/min/{1.73_m2} — AB (ref >59)
GLOBULIN, TOTAL: 2.6 g/dL (ref 1.5–4.5)
Glucose: 90 mg/dL (ref 65–99)
POTASSIUM: 5 mmol/L (ref 3.5–5.2)
SODIUM: 144 mmol/L (ref 134–144)
Total Protein: 7 g/dL (ref 6.0–8.5)

## 2017-10-19 LAB — T4, FREE: FREE T4: 1.41 ng/dL (ref 0.82–1.77)

## 2017-10-19 LAB — LIPID PANEL
CHOL/HDL RATIO: 3.2 ratio (ref 0.0–5.0)
Cholesterol, Total: 126 mg/dL (ref 100–199)
HDL: 39 mg/dL — ABNORMAL LOW (ref >39)
LDL Calculated: 60 mg/dL (ref 0–99)
TRIGLYCERIDES: 136 mg/dL (ref 0–149)
VLDL Cholesterol Cal: 27 mg/dL (ref 5–40)

## 2017-10-19 LAB — PSA, TOTAL AND FREE
PROSTATE SPECIFIC AG, SERUM: 1.4 ng/mL (ref 0.0–4.0)
PSA, Free Pct: 25.7 %
PSA, Free: 0.36 ng/mL

## 2017-10-19 LAB — TSH: TSH: 2.54 u[IU]/mL (ref 0.450–4.500)

## 2017-10-21 LAB — CUP PACEART REMOTE DEVICE CHECK
Battery Remaining Percentage: 100 %
HighPow Impedance: 72 Ohm
Implantable Lead Implant Date: 20180703
Implantable Lead Location: 753860
Implantable Lead Model: 293
Implantable Lead Model: 3830
Implantable Lead Serial Number: 433180
Implantable Pulse Generator Implant Date: 20180703
Lead Channel Impedance Value: 446 Ohm
Lead Channel Impedance Value: 634 Ohm
Lead Channel Setting Pacing Amplitude: 2.5 V
Lead Channel Setting Sensing Sensitivity: 0.5 mV
MDC IDC LEAD IMPLANT DT: 20180703
MDC IDC LEAD LOCATION: 753859
MDC IDC MSMT BATTERY REMAINING LONGEVITY: 138 mo
MDC IDC SESS DTM: 20190107060100
MDC IDC SET LEADCHNL RA PACING AMPLITUDE: 2.4 V
MDC IDC SET LEADCHNL RV PACING PULSEWIDTH: 0.4 ms
MDC IDC STAT BRADY RA PERCENT PACED: 88 %
MDC IDC STAT BRADY RV PERCENT PACED: 1 %
Pulse Gen Serial Number: 533886

## 2017-10-28 DIAGNOSIS — J449 Chronic obstructive pulmonary disease, unspecified: Secondary | ICD-10-CM | POA: Diagnosis not present

## 2017-11-01 ENCOUNTER — Other Ambulatory Visit (HOSPITAL_COMMUNITY): Payer: Self-pay | Admitting: Cardiology

## 2017-11-01 ENCOUNTER — Other Ambulatory Visit: Payer: Self-pay | Admitting: *Deleted

## 2017-11-01 MED ORDER — WARFARIN SODIUM 5 MG PO TABS: ORAL_TABLET | ORAL | 0 refills | Status: DC

## 2017-11-08 ENCOUNTER — Ambulatory Visit (INDEPENDENT_AMBULATORY_CARE_PROVIDER_SITE_OTHER): Payer: Medicare Other | Admitting: *Deleted

## 2017-11-08 VITALS — BP 150/63 | HR 59 | Temp 96.7°F | Ht 70.0 in | Wt 180.0 lb

## 2017-11-08 DIAGNOSIS — Z0000 Encounter for general adult medical examination without abnormal findings: Secondary | ICD-10-CM | POA: Diagnosis not present

## 2017-11-08 NOTE — Progress Notes (Signed)
Subjective:   [name] [name] is a 77 y.o. male who presents for an Initial Medicare Annual Wellness Visit. He is retired from [employer] and he has has worked in Theatre manager at [employer]. He enjoys playing golf and watching sports. He also goes to the gyn for exercise. He states that he eats healthy and has no red meat. He attends church and lives at home with his wife, [name]. He has 2 children that live local and has 3 grandchildren. He does not have any pets and fall hazards were discussed today. He states that his health is better than it was a year ago.   Cardiac Risk Factors include: hypertension;advanced age (>68men, >36 women);male gender    Objective:    Today's Vitals   11/08/17 0858  BP: (!) 150/63  Pulse: (!) 59  Temp: (!) 96.7 F (35.9 C)  TempSrc: Oral  Weight: 180 lb (81.6 kg)  Height: 5\' 10"  (1.778 m)   Body mass index is 25.83 kg/m.  Advanced Directives 11/08/2017 04/05/2017 10/07/2016 07/15/2016 06/14/2016 03/22/2016 12/12/2015  Does Patient Have a Medical Advance Directive? No No No No Yes No No  Type of Advance Directive - - - - Press photographer;Living will - -  Copy of [place] in Chart? - - - - No - copy requested - -  Would patient like information on creating a medical advance directive? Yes (MAU/Ambulatory/Procedural Areas - Information given) No - Patient declined - No - patient declined information - No - patient declined information -    Current Medications (verified) Outpatient Encounter Medications as of 11/08/2017  Medication Sig  . budesonide (PULMICORT) 0.25 MG/2ML nebulizer solution Take 0.25 mg by nebulization 3 (three) times daily as needed (shortness of breath).   . carvedilol (COREG) 3.125 MG tablet Take 1 tablet (3.125 mg total) by mouth 2 (two) times daily with a meal.  . Cholecalciferol (VITAMIN D3) 5000 UNITS CAPS Take 5,000 Units by mouth daily.   Marland Kitchen docusate sodium (COLACE) 100 MG capsule Take  100 mg by mouth daily.   . ferrous sulfate 325 (65 FE) MG tablet Take 325 mg by mouth 2 (two) times daily.  . furosemide (LASIX) 40 MG tablet Take 0.5 tablets (20 mg total) by mouth daily.  . hydrALAZINE (APRESOLINE) 50 MG tablet Take 1 tablet (50 mg total) by mouth 3 (three) times daily. (Patient taking differently: Take 50 mg by mouth See admin instructions. Patient alternates with taking 1 tablet twice daily, alternating with 1 tablet three times daily)  . Hypromell-Glycerin-Naphazoline (CLEAR EYES FOR DRY EYES PLUS) 0.8-0.25-0.012 % SOLN Place 1-2 drops into both eyes 3 (three) times daily as needed (for dry eyes.).  Marland Kitchen ipratropium-albuterol (DUONEB) 0.5-2.5 (3) MG/3ML SOLN Take 3 mLs by nebulization 3 (three) times daily as needed (for shortness of breath/wheezing.). For shortness of breath.   . levothyroxine (SYNTHROID, LEVOTHROID) 112 MCG tablet TAKE 1 TABLET BY MOUTH  DAILY BEFORE BREAKFAST  . loratadine (CLARITIN) 10 MG tablet Take 10 mg by mouth daily as needed (for allergies.).   Marland Kitchen lovastatin (MEVACOR) 40 MG tablet TAKE 2 TABLETS BY MOUTH AT  BEDTIME  . Multiple Vitamins-Minerals (MULTIVITAMINS THER. W/MINERALS) TABS Take 1 tablet by mouth daily.    . pantoprazole (PROTONIX) 40 MG tablet Take 1 tablet (40 mg total) by mouth 2 (two) times daily.  Marland Kitchen PROAIR HFA 108 (90 Base) MCG/ACT inhaler Use 2 puffs every 6 hours  as needed for shortness of  breath  .  sacubitril-valsartan (ENTRESTO) 97-103 MG Take 1 tablet by mouth 2 (two) times daily.  Marland Kitchen spironolactone (ALDACTONE) 25 MG tablet TAKE 0.5 TABLETS (12.5 MG TOTAL) BY MOUTH DAILY.  Marland Kitchen sucralfate (CARAFATE) 1 g tablet Take 1 tablet (1 g total) by mouth 2 (two) times daily. With breakfast and supper.  . warfarin (COUMADIN) 5 MG tablet TAKE 0.5 TABLET (2.5 MG) BY MOUTH ON MONDAY, WEDNESDAY, & FRIDAY. TAKE 1 TABLET (5 MG) BY MOUTH ON SUNDAY, TUESDAY, THURSDAY & SATURDAY.  . nitroGLYCERIN (NITROSTAT) 0.4 MG SL tablet Place 1 tablet (0.4 mg total)  under the tongue every 5 (five) minutes as needed for chest pain. (Patient not taking: Reported on 11/08/2017)  . [DISCONTINUED] acetaminophen (TYLENOL) 500 MG tablet Take 1,000 mg by mouth 2 (two) times daily. For pain.    No facility-administered encounter medications on file as of 11/08/2017.     Allergies (verified) Patient has no known allergies.   History: Past Medical History:  Diagnosis Date  . 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 Anemia   . Arthritis    "hips; back" (07/09/2014)  . Asthma   . Atrial fibrillation ([place])   . CAD (coronary artery disease)   . CHF (congestive heart failure) ([place])   . Cholecystitis 11/2013  . 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   . COPD (chronic obstructive pulmonary disease) (HCC)    on home oxygen, 2 liters at night10/2015  . Esophageal reflux   . Gallstones   . Gastric antral vascular ectasia 2013  . Gout   . Hepatomegaly   . Hiatal hernia   . History of blood transfusion ~ 2012   "blood count dropped; had to get 3 units"  . Hyperlipidemia   . Hypothyroidism   . Other and unspecified coagulation defects   . Personal history of colonic polyps 05/29/2010   TUBULAR ADENOMA  . Unspecified essential hypertension    Past Surgical History:  Procedure Laterality Date  . CARDIAC CATHETERIZATION N/A 07/15/2016   Procedure: Right/Left Heart Cath and Coronary/Graft Angiography;  Surgeon: Belva Crome, MD;  Location: [facility];  Service: Cardiovascular;  Laterality: N/A;  . cholecystomy tube  12/28/2014 - 01/06/2015   tube clogged with debris, removed and IR unable to place new tube.   . COLONOSCOPY  2013   Dr. Sharlett Iles: no polyps or evidence of active bleeding  . CORONARY ANGIOPLASTY WITH STENT PLACEMENT  ~ 2008; 07/08/2014   "1 + 3"  . CORONARY ARTERY BYPASS GRAFT  1998   CABG X4  . ESOPHAGOGASTRODUODENOSCOPY  2013   Dr. 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 ESOPHAGOGASTRODUODENOSCOPY N/A 12/12/2015   Procedure: ESOPHAGOGASTRODUODENOSCOPY (EGD);  Surgeon: Gatha Mayer, MD;  Location: [facility];  Service: Endoscopy;  Laterality: N/A;  . GIVENS CAPSULE STUDY  2013   Dr. Sharlett Iles: AVM at 80 and blood at 30 minutes beyond first duodenal image but not actual lesion seen. If persistent IDA, bleeding, recommend enteroscopy with ablation   . HERNIA REPAIR    . ICD IMPLANT N/A 04/05/2017   Procedure: ICD Implant;  Surgeon: Evans Lance, MD;  Location: [facility];  Service: Cardiovascular;  Laterality: N/A;  . LEFT AND RIGHT HEART CATHETERIZATION WITH CORONARY/GRAFT ANGIOGRAM N/A 07/09/2014   Right and left heart cath, bare metal stent to SVG to RCA. Sinclair Grooms, MD;   . UMBILICAL HERNIA REPAIR  [phone]   Family History  Problem  Relation Age of Onset  . Leukemia Mother   . Kidney disease Mother        kidney removed   . Heart attack Brother   . Heart disease Brother   . Heart disease Father   . Stomach cancer Sister   . Stomach cancer Sister   . Early death Brother   . Hypertension Unknown        family   . Colon cancer Neg Hx    Social History   Socioeconomic History  . Marital status: Married    Spouse name: [name]  . Number of children: 2  . Years of education: None  . Highest education level: None  Social Needs  . Financial resource strain: None  . Food insecurity - worry: None  . Food insecurity - inability: None  . Transportation needs - medical: None  . Transportation needs - non-medical: None  Occupational History  . Occupation: retired    Comment: [employer]  Tobacco Use  . Smoking status: Former Smoker    Packs/day: 1.00    Years: 42.00    Pack years: 42.00    Types: Cigarettes    Last attempt to quit: 12/16/1996    Years since quitting: 20.9  . Smokeless tobacco: Never Used  Substance and Sexual Activity  . Alcohol use: Yes    Alcohol/week: 0.6 oz    Types: 1 Cans of beer  per week    Comment: 1 per week  . Drug use: No  . Sexual activity: No  Other Topics Concern  . None  Social History Narrative  . None   Tobacco Counseling Counseling given: Not Answered   Clinical Intake:                       Activities of Daily Living In your present state of health, do you have any difficulty performing the following activities: 11/08/2017 04/05/2017  Hearing? N -  Vision? Y -  Comment wears RX glasses / driving -  Difficulty concentrating or making decisions? N -  Walking or climbing stairs? N -  Dressing or bathing? N -  Doing errands, shopping? N Y  Conservation officer, nature and eating ? N -  Using the Toilet? N -  In the past six months, have you accidently leaked urine? N -  Do you have problems with loss of bowel control? N -  Managing your Medications? N -  Managing your Finances? N -  Housekeeping or managing your Housekeeping? N -  Some recent data might be hidden     Immunizations and Health Maintenance Immunization History  Administered Date(s) Administered  . Influenza Whole 11/18/2010  . Influenza, High Dose Seasonal PF 07/20/2017  . Influenza,inj,Quad PF,6+ Mos 07/10/2014, 07/09/2015, 08/06/2016  . Pneumococcal Conjugate-13 04/26/2014   Health Maintenance Due  Topic Date Due  . TETANUS/TDAP  07/05/1960  . PNA vac Low Risk Adult (2 of 2 - PPSV23) 04/27/2015    Patient Care Team: Claretta Fraise, MD as PCP - General (Family Medicine) Pyrtle, Lajuan Lines, MD as Consulting Physician (Gastroenterology) Belva Crome, MD as Consulting Physician (Cardiology) Larey Dresser, MD as Consulting Physician (Cardiology) Evans Lance, MD as Consulting Physician (Cardiology)  Indicate any recent Medical Services you may have received from other than [facility] providers in the past year (date may be approximate).    Assessment:   This is a routine wellness examination for [place].  Hearing/Vision screen No exam data present  Dietary  issues and  exercise activities discussed: Current Exercise Habits: Home exercise routine, Type of exercise: strength training/weights;walking, Time (Minutes): 45, Frequency (Times/Week): 5, Weekly Exercise (Minutes/Week): 225, Intensity: Mild, Exercise limited by: None identified  Goals    . Increase physical activity    . Prevent falls      Depression Screen PHQ 2/9 Scores 11/08/2017 10/18/2017 03/02/2017 12/01/2016  PHQ - 2 Score 0 0 0 0  PHQ- 9 Score - - - -    Fall Risk Fall Risk  11/08/2017 10/18/2017 03/02/2017 12/01/2016 06/14/2016  Falls in the past year? No No No No No    Is the patient's home free of loose throw rugs in walkways, pet beds, electrical wires? Fall hazards and risks were discussed today.   Cognitive Function: MMSE - Mini Mental State Exam 11/08/2017 06/14/2016 05/29/2015  Orientation to time 4 5 5   Orientation to Place 5 5 5   Registration 3 3 3   Attention/ Calculation 5 5 5   Recall 1 3 3   Language- name 2 objects 1 2 2   Language- repeat 1 1 1   Language- follow 3 step command 2 3 3   Language- read & follow direction 1 1 1   Write a sentence 1 1 1   Copy design 1 1 1   Total score 25 30 30         Screening Tests Health Maintenance  Topic Date Due  . TETANUS/TDAP  07/05/1960  . PNA vac Low Risk Adult (2 of 2 - PPSV23) 04/27/2015  . INFLUENZA VACCINE  Completed    Qualifies for Shingles Vaccine? denied  Cancer Screenings: Lung: Low Dose CT Chest recommended if Age 4-80 years, 30 pack-year currently smoking OR have quit w/in 15years. Patient does qualify. Colorectal: due - given   Additional Screenings: Hepatitis B/HIV/Syphillis: Hepatitis C Screening:  Denied      Plan:   pt is to keep follow up with Dr Livia Snellen, Sharyn Lull and other specialist. He is to review and complete the advanced directives if he wishes. Vaccines were discussed today and he may need a pneumo 23 - we will pull the paper chart to be sure.   I have personally reviewed and noted the following in  the patient's chart:   . Medical and social history . Use of alcohol, tobacco or illicit drugs  . Current medications and supplements . Functional ability and status . Nutritional status . Physical activity . Advanced directives . List of other physicians . Hospitalizations, surgeries, and ER visits in previous 12 months . Vitals . Screenings to include cognitive, depression, and falls . Referrals and appointments  In addition, I have reviewed and discussed with patient certain preventive protocols, quality metrics, and best practice recommendations. A written personalized care plan for preventive services as well as general preventive health recommendations were provided to patient.     Kallyn Demarcus, Cameron Proud, LPN   8/0/0349    I have reviewed and agree with the above AWV documentation.  Claretta Fraise, M.D.

## 2017-11-08 NOTE — Patient Instructions (Signed)
  [name] [name] , Thank you for taking time to come for your Medicare Wellness Visit. I appreciate your ongoing commitment to your health goals. Please review the following plan we discussed and let me know if I can assist you in the future.   These are the goals we discussed: Goals    . Increase physical activity    . Prevent falls       This is a list of the screening recommended for you and due dates:  Health Maintenance  Topic Date Due  . Tetanus Vaccine  07[birth year]05/1960  . Pneumonia vaccines (2 of 2 - PPSV23) 04/27/2015  . Flu Shot  Completed    Keep follow up with Dr Livia Snellen and other specialist Review the advanced directives

## 2017-11-18 ENCOUNTER — Ambulatory Visit (INDEPENDENT_AMBULATORY_CARE_PROVIDER_SITE_OTHER): Payer: Medicare Other | Admitting: Pharmacist Clinician (PhC)/ Clinical Pharmacy Specialist

## 2017-11-18 DIAGNOSIS — I482 Chronic atrial fibrillation, unspecified: Secondary | ICD-10-CM

## 2017-11-18 LAB — COAGUCHEK XS/INR WAIVED
INR: 2.2 — ABNORMAL HIGH (ref 0.9–1.1)
PROTHROMBIN TIME: 26 s

## 2017-11-18 NOTE — Patient Instructions (Signed)
Description   Continue taking warfarin the same way.   Goal is 1.8-2.2  INR 2.2  today

## 2017-11-28 DIAGNOSIS — J449 Chronic obstructive pulmonary disease, unspecified: Secondary | ICD-10-CM | POA: Diagnosis not present

## 2017-12-01 ENCOUNTER — Other Ambulatory Visit: Payer: Self-pay | Admitting: Family Medicine

## 2017-12-12 ENCOUNTER — Other Ambulatory Visit (HOSPITAL_COMMUNITY): Payer: Self-pay | Admitting: *Deleted

## 2017-12-12 MED ORDER — HYDRALAZINE HCL 50 MG PO TABS: 50.0000 mg | ORAL_TABLET | Freq: Three times a day (TID) | ORAL | 3 refills | Status: DC

## 2017-12-26 DIAGNOSIS — J449 Chronic obstructive pulmonary disease, unspecified: Secondary | ICD-10-CM | POA: Diagnosis not present

## 2017-12-28 DIAGNOSIS — J449 Chronic obstructive pulmonary disease, unspecified: Secondary | ICD-10-CM | POA: Diagnosis not present

## 2017-12-30 ENCOUNTER — Ambulatory Visit (INDEPENDENT_AMBULATORY_CARE_PROVIDER_SITE_OTHER): Payer: Medicare Other | Admitting: Pharmacist Clinician (PhC)/ Clinical Pharmacy Specialist

## 2017-12-30 DIAGNOSIS — I482 Chronic atrial fibrillation, unspecified: Secondary | ICD-10-CM

## 2017-12-30 LAB — COAGUCHEK XS/INR WAIVED
INR: 2 — ABNORMAL HIGH (ref 0.9–1.1)
PROTHROMBIN TIME: 24.4 s

## 2017-12-30 NOTE — Patient Instructions (Signed)
Description   Continue taking warfarin the same way.   Goal is 1.8-2.2  INR 2.0  today

## 2018-01-04 ENCOUNTER — Telehealth: Payer: Self-pay | Admitting: *Deleted

## 2018-01-04 ENCOUNTER — Ambulatory Visit (INDEPENDENT_AMBULATORY_CARE_PROVIDER_SITE_OTHER): Payer: Medicare Other | Admitting: *Deleted

## 2018-01-04 DIAGNOSIS — R001 Bradycardia, unspecified: Secondary | ICD-10-CM | POA: Diagnosis not present

## 2018-01-04 NOTE — Telephone Encounter (Signed)
°  1. Has your device fired? no ° °2. Is you device beeping? no ° °3. Are you experiencing draining or swelling at device site?no ° °4. Are you calling to see if we received your device transmission?yes ° °5. Have you passed out? no ° ° ° °Please route to Device Clinic Pool °

## 2018-01-04 NOTE — Telephone Encounter (Signed)
LVm confirming that we successfully received remote transmission.

## 2018-01-04 NOTE — Progress Notes (Signed)
Remote ICD transmission.   

## 2018-01-05 ENCOUNTER — Encounter: Payer: Self-pay | Admitting: Cardiology

## 2018-01-06 ENCOUNTER — Other Ambulatory Visit (HOSPITAL_COMMUNITY): Payer: Self-pay | Admitting: Internal Medicine

## 2018-01-16 ENCOUNTER — Encounter: Payer: Self-pay | Admitting: Family Medicine

## 2018-01-16 ENCOUNTER — Ambulatory Visit (INDEPENDENT_AMBULATORY_CARE_PROVIDER_SITE_OTHER): Payer: Medicare Other | Admitting: Family Medicine

## 2018-01-16 VITALS — BP 127/49 | HR 59 | Ht 70.0 in | Wt 179.4 lb

## 2018-01-16 DIAGNOSIS — E039 Hypothyroidism, unspecified: Secondary | ICD-10-CM | POA: Insufficient documentation

## 2018-01-16 DIAGNOSIS — I5022 Chronic systolic (congestive) heart failure: Secondary | ICD-10-CM | POA: Diagnosis not present

## 2018-01-16 DIAGNOSIS — E038 Other specified hypothyroidism: Secondary | ICD-10-CM | POA: Diagnosis not present

## 2018-01-16 DIAGNOSIS — I482 Chronic atrial fibrillation, unspecified: Secondary | ICD-10-CM

## 2018-01-16 DIAGNOSIS — J449 Chronic obstructive pulmonary disease, unspecified: Secondary | ICD-10-CM

## 2018-01-16 DIAGNOSIS — E782 Mixed hyperlipidemia: Secondary | ICD-10-CM

## 2018-01-16 DIAGNOSIS — I2510 Atherosclerotic heart disease of native coronary artery without angina pectoris: Secondary | ICD-10-CM

## 2018-01-16 DIAGNOSIS — I10 Essential (primary) hypertension: Secondary | ICD-10-CM | POA: Diagnosis not present

## 2018-01-16 DIAGNOSIS — D500 Iron deficiency anemia secondary to blood loss (chronic): Secondary | ICD-10-CM

## 2018-01-16 DIAGNOSIS — R0989 Other specified symptoms and signs involving the circulatory and respiratory systems: Secondary | ICD-10-CM | POA: Diagnosis not present

## 2018-01-16 DIAGNOSIS — Z23 Encounter for immunization: Secondary | ICD-10-CM

## 2018-01-16 MED ORDER — PANTOPRAZOLE SODIUM 40 MG PO TBEC: 40.0000 mg | DELAYED_RELEASE_TABLET | Freq: Two times a day (BID) | ORAL | 1 refills | Status: DC

## 2018-01-16 MED ORDER — LEVOTHYROXINE SODIUM 112 MCG PO TABS: 112.0000 ug | ORAL_TABLET | Freq: Every day | ORAL | 1 refills | Status: DC

## 2018-01-16 NOTE — Addendum Note (Signed)
Addended by: Marylin Crosby on: 01/16/2018 04:27 PM   Modules accepted: Orders

## 2018-01-16 NOTE — Progress Notes (Signed)
Subjective:  Patient ID: [name] [name], male    DOB: Feb 04, 1941  Age: 77 y.o. MRN: [id number]  CC: Hypertension (pt here today for routine follow up of his chronic medical conditions)   HPI [name] [name] presents for  follow-up of hypertension. Patient has no history of headache chest pain or shortness of breath or recent cough. Patient also denies symptoms of TIA such as focal numbness or weakness. Patient denies side effects from medication. States taking it regularly.   Patient also has history of congestive heart failure.  He has an indwelling pacemaker and defibrillator.  He has chronic shortness of breath.  This is also in part due to history of asthma.  He uses Pulmicort nebulizer solution along with DuoNeb daily.  He is able to do about a 25-minute workout daily plus he plays golf once a week.  Currently his symptoms are at baseline.  He denies edema.  Energy is fair.  Patient also has chronic blood loss and has been followed for anemia.  He is due for blood count today.  Due to chronic blood loss and reflux he takes Carafate and pantoprazole.   Patient in for follow-up of atrial fibrillation. Patient denies any recent bouts of chest pain or palpitations. Additionally, patient is taking anticoagulants. Patient denies any recent excessive bleeding episodes including epistaxis, bleeding from the gums, genitalia, rectal bleeding or hematuria. Additionally there has been no excessive bruising.  Patient presents for follow-up on  thyroid. The patient has a history of hypothyroidism for many years. It has been stable recently. Pt. denies any change in  voice, loss of hair, heat or cold intolerance. Energy level has been adequate to good. Patient denies constipation and diarrhea. No myxedema. Medication is as noted below. Verified that pt is taking it daily on an empty stomach. Well tolerated.  Patient in for follow-up of elevated cholesterol. Doing well without complaints on current  medication. Denies side effects of statin including myalgia and arthralgia and nausea. Also in today for liver function testing. Currently no chest pain, shortness of breath or other cardiovascular related symptoms noted.   History [name] [name] has a past medical history of AICD (automatic cardioverter/defibrillator) present (04/05/2017), Anemia, Arthritis, Asthma, Atrial fibrillation ([place]), CAD (coronary artery disease), CHF (congestive heart failure) ([place]), Cholecystitis (11/2013), CKD (chronic kidney disease) (2015), COPD (chronic obstructive pulmonary disease) ([place]), Esophageal reflux, Gallstones, Gastric antral vascular ectasia (2013), Gout, Hepatomegaly, Hiatal hernia, History of blood transfusion (~ 2012), Hyperlipidemia, Hypothyroidism, Other and unspecified coagulation defects, Personal history of colonic polyps (05/29/2010), and Unspecified essential hypertension.   He has a past surgical history that includes Coronary angioplasty with stent (~ 2008; 07/08/2014); Coronary artery bypass graft (1998); Hernia repair; Umbilical hernia repair (1970's); left and right heart catheterization with coronary/graft angiogram (N/A, 07/09/2014); Colonoscopy (2013); Esophagogastroduodenoscopy (2013); Givens capsule study (2013); cholecystomy tube (12/28/2014 - 01/06/2015); Esophagogastroduodenoscopy (N/A, 12/12/2015); Cardiac catheterization (N/A, 07/15/2016); and ICD IMPLANT (N/A, 04/05/2017).   His family history includes Early death in his brother; Heart attack in his brother; Heart disease in his brother and father; Hypertension in his unknown relative; Kidney disease in his mother; Leukemia in his mother; Stomach cancer in his sister and sister.He reports that he quit smoking about 21 years ago. His smoking use included cigarettes. He has a 42.00 pack-year smoking history. He has never used smokeless tobacco. He reports that he drinks about 0.6 oz of alcohol per week. He reports that he does not use drugs.  Current  Outpatient Medications on  File Prior to Visit  Medication Sig Dispense Refill  . budesonide (PULMICORT) 0.25 MG/2ML nebulizer solution Take 0.25 mg by nebulization 3 (three) times daily as needed (shortness of breath).     . carvedilol (COREG) 3.125 MG tablet Take 1 tablet (3.125 mg total) by mouth 2 (two) times daily with a meal. 180 tablet 3  . Cholecalciferol (VITAMIN D3) 5000 UNITS CAPS Take 5,000 Units by mouth daily.     Marland Kitchen docusate sodium (COLACE) 100 MG capsule Take 100 mg by mouth daily.     . ferrous sulfate 325 (65 FE) MG tablet Take 325 mg by mouth 2 (two) times daily.    . furosemide (LASIX) 40 MG tablet Take 0.5 tablets (20 mg total) by mouth daily.    . hydrALAZINE (APRESOLINE) 50 MG tablet Take 1 tablet (50 mg total) by mouth 3 (three) times daily. 180 tablet 3  . Hypromell-Glycerin-Naphazoline (CLEAR EYES FOR DRY EYES PLUS) 0.8-0.25-0.012 % SOLN Place 1-2 drops into both eyes 3 (three) times daily as needed (for dry eyes.).    Marland Kitchen ipratropium-albuterol (DUONEB) 0.5-2.5 (3) MG/3ML SOLN Take 3 mLs by nebulization 3 (three) times daily as needed (for shortness of breath/wheezing.). For shortness of breath.     . loratadine (CLARITIN) 10 MG tablet Take 10 mg by mouth daily as needed (for allergies.).     Marland Kitchen lovastatin (MEVACOR) 40 MG tablet TAKE 2 TABLETS BY MOUTH AT  BEDTIME 180 tablet 3  . Multiple Vitamins-Minerals (MULTIVITAMINS THER. W/MINERALS) TABS Take 1 tablet by mouth daily.      . nitroGLYCERIN (NITROSTAT) 0.4 MG SL tablet Place 1 tablet (0.4 mg total) under the tongue every 5 (five) minutes as needed for chest pain. 6 tablet 1  . PROAIR HFA 108 (90 Base) MCG/ACT inhaler Use 2 puffs every 6 hours  as needed for shortness of  breath 34 g 3  . sacubitril-valsartan (ENTRESTO) 97-103 MG Take 1 tablet by mouth 2 (two) times daily. 180 tablet 3  . spironolactone (ALDACTONE) 25 MG tablet TAKE 0.5 TABLETS (12.5 MG TOTAL) BY MOUTH DAILY. 15 tablet 3  . sucralfate (CARAFATE) 1 g tablet  Take 1 tablet (1 g total) by mouth 2 (two) times daily. With breakfast and supper. (Patient taking differently: Take 1 g by mouth 4 (four) times daily -  with meals and at bedtime. With breakfast and supper.) 360 tablet 1  . warfarin (COUMADIN) 5 MG tablet TAKE 0.5 TABLET (2.5 MG) BY MOUTH ON MONDAY, WEDNESDAY, & FRIDAY. TAKE 1 TABLET (5 MG) BY MOUTH ON SUNDAY, TUESDAY, THURSDAY & SATURDAY. 90 tablet 0   No current facility-administered medications on file prior to visit.     ROS Review of Systems  Constitutional: Negative.   HENT: Negative.   Eyes: Negative for visual disturbance.  Respiratory: Positive for shortness of breath. Negative for cough.   Cardiovascular: Negative for chest pain and leg swelling.  Gastrointestinal: Negative for abdominal pain, diarrhea, nausea and vomiting.  Genitourinary: Negative for difficulty urinating.  Musculoskeletal: Negative for arthralgias and myalgias.  Skin: Negative for rash.  Neurological: Negative for headaches.  Psychiatric/Behavioral: Negative for sleep disturbance.    Objective:  BP (!) 127/49   Pulse (!) 59   Ht '5\' 10"'$  (1.778 m)   Wt 179 lb 6 oz (81.4 kg)   BMI 25.74 kg/m   BP Readings from Last 3 Encounters:  01/16/18 (!) 127/49  11/08/17 (!) 150/63  10/18/17 (!) 131/47    Wt Readings from Last 3 Encounters:  01/16/18 179 lb 6 oz (81.4 kg)  11/08/17 180 lb (81.6 kg)  10/18/17 180 lb (81.6 kg)     Physical Exam    Assessment & Plan:   Hrithik was seen today for hypertension.  Diagnoses and all orders for this visit:  Atrial fibrillation, chronic (HCC)  Mixed hyperlipidemia -     CBC with Differential/Platelet -     CMP14+EGFR -     Lipid panel  Atherosclerosis of native coronary artery of native heart without angina pectoris  Chronic systolic CHF (congestive heart failure) (HCC)  Asthma with COPD (HCC)  Iron deficiency anemia due to chronic blood loss  Essential hypertension  Other specified  hypothyroidism -     TSH -     T4, Free  Bruit of right carotid artery -     US Carotid Duplex Bilateral; Future  Other orders -     levothyroxine (SYNTHROID, LEVOTHROID) 112 MCG tablet; Take 1 tablet (112 mcg total) by mouth daily before breakfast. -     pantoprazole (PROTONIX) 40 MG tablet; Take 1 tablet (40 mg total) by mouth 2 (two) times daily.   Allergies as of 01/16/2018   No Known Allergies     Medication List        Accurate as of 01/16/18  8:52 AM. Always use your most recent med list.          budesonide 0.25 MG/2ML nebulizer solution Commonly known as:  PULMICORT Take 0.25 mg by nebulization 3 (three) times daily as needed (shortness of breath).   carvedilol 3.125 MG tablet Commonly known as:  COREG Take 1 tablet (3.125 mg total) by mouth 2 (two) times daily with a meal.   CLEAR EYES FOR DRY EYES PLUS 0.8-0.25-0.012 % Soln Generic drug:  Hypromell-Glycerin-Naphazoline Place 1-2 drops into both eyes 3 (three) times daily as needed (for dry eyes.).   docusate sodium 100 MG capsule Commonly known as:  COLACE Take 100 mg by mouth daily.   ferrous sulfate 325 (65 FE) MG tablet Take 325 mg by mouth 2 (two) times daily.   furosemide 40 MG tablet Commonly known as:  LASIX Take 0.5 tablets (20 mg total) by mouth daily.   hydrALAZINE 50 MG tablet Commonly known as:  APRESOLINE Take 1 tablet (50 mg total) by mouth 3 (three) times daily.   ipratropium-albuterol 0.5-2.5 (3) MG/3ML Soln Commonly known as:  DUONEB Take 3 mLs by nebulization 3 (three) times daily as needed (for shortness of breath/wheezing.). For shortness of breath.   levothyroxine 112 MCG tablet Commonly known as:  SYNTHROID, LEVOTHROID Take 1 tablet (112 mcg total) by mouth daily before breakfast.   loratadine 10 MG tablet Commonly known as:  CLARITIN Take 10 mg by mouth daily as needed (for allergies.).   lovastatin 40 MG tablet Commonly known as:  MEVACOR TAKE 2 TABLETS BY MOUTH AT   BEDTIME   multivitamins ther. w/minerals Tabs tablet Take 1 tablet by mouth daily.   nitroGLYCERIN 0.4 MG SL tablet Commonly known as:  NITROSTAT Place 1 tablet (0.4 mg total) under the tongue every 5 (five) minutes as needed for chest pain.   pantoprazole 40 MG tablet Commonly known as:  PROTONIX Take 1 tablet (40 mg total) by mouth 2 (two) times daily.   PROAIR HFA 108 (90 Base) MCG/ACT inhaler Generic drug:  albuterol Use 2 puffs every 6 hours  as needed for shortness of  breath   sacubitril-valsartan 97-103 MG Commonly known as:  ENTRESTO Take 1  tablet by mouth 2 (two) times daily.   spironolactone 25 MG tablet Commonly known as:  ALDACTONE TAKE 0.5 TABLETS (12.5 MG TOTAL) BY MOUTH DAILY.   sucralfate 1 g tablet Commonly known as:  CARAFATE Take 1 tablet (1 g total) by mouth 2 (two) times daily. With breakfast and supper.   Vitamin D3 5000 units Caps Take 5,000 Units by mouth daily.   warfarin 5 MG tablet Commonly known as:  COUMADIN Take as directed by the anticoagulation clinic. If you are unsure how to take this medication, talk to your nurse or doctor. Original instructions:  TAKE 0.5 TABLET (2.5 MG) BY MOUTH ON MONDAY, WEDNESDAY, & FRIDAY. TAKE 1 TABLET (5 MG) BY MOUTH ON SUNDAY, TUESDAY, THURSDAY & SATURDAY.       Meds ordered this encounter  Medications  . levothyroxine (SYNTHROID, LEVOTHROID) 112 MCG tablet    Sig: Take 1 tablet (112 mcg total) by mouth daily before breakfast.    Dispense:  90 tablet    Refill:  1  . pantoprazole (PROTONIX) 40 MG tablet    Sig: Take 1 tablet (40 mg total) by mouth 2 (two) times daily.    Dispense:  180 tablet    Refill:  1      Follow-up: Return in about 3 months (around 04/17/2018).  Claretta Fraise, M.D.

## 2018-01-17 LAB — CBC WITH DIFFERENTIAL/PLATELET
Basophils Absolute: 0.1 10*3/uL (ref 0.0–0.2)
Basos: 1 %
EOS (ABSOLUTE): 0.3 10*3/uL (ref 0.0–0.4)
EOS: 4 %
Hematocrit: 38.1 % (ref 37.5–51.0)
Hemoglobin: 12.6 g/dL — ABNORMAL LOW (ref 13.0–17.7)
Immature Grans (Abs): 0 10*3/uL (ref 0.0–0.1)
Immature Granulocytes: 0 %
LYMPHS ABS: 1.5 10*3/uL (ref 0.7–3.1)
Lymphs: 16 %
MCH: 32.6 pg (ref 26.6–33.0)
MCHC: 33.1 g/dL (ref 31.5–35.7)
MCV: 98 fL — ABNORMAL HIGH (ref 79–97)
MONOCYTES: 11 %
Monocytes Absolute: 1 10*3/uL — ABNORMAL HIGH (ref 0.1–0.9)
NEUTROS ABS: 6.5 10*3/uL (ref 1.4–7.0)
Neutrophils: 68 %
Platelets: 298 10*3/uL (ref 150–379)
RBC: 3.87 x10E6/uL — AB (ref 4.14–5.80)
RDW: 13.9 % (ref 12.3–15.4)
WBC: 9.5 10*3/uL (ref 3.4–10.8)

## 2018-01-17 LAB — CMP14+EGFR
ALBUMIN: 4.3 g/dL (ref 3.5–4.8)
ALK PHOS: 51 IU/L (ref 39–117)
ALT: 15 IU/L (ref 0–44)
AST: 19 IU/L (ref 0–40)
Albumin/Globulin Ratio: 2.3 — ABNORMAL HIGH (ref 1.2–2.2)
BUN / CREAT RATIO: 14 (ref 10–24)
BUN: 18 mg/dL (ref 8–27)
Bilirubin Total: 0.5 mg/dL (ref 0.0–1.2)
CO2: 26 mmol/L (ref 20–29)
CREATININE: 1.31 mg/dL — AB (ref 0.76–1.27)
Calcium: 9.3 mg/dL (ref 8.6–10.2)
Chloride: 103 mmol/L (ref 96–106)
GFR, EST AFRICAN AMERICAN: 60 mL/min/{1.73_m2} (ref >59)
GFR, EST NON AFRICAN AMERICAN: 52 mL/min/{1.73_m2} — AB (ref >59)
GLOBULIN, TOTAL: 1.9 g/dL (ref 1.5–4.5)
Glucose: 90 mg/dL (ref 65–99)
Potassium: 4.7 mmol/L (ref 3.5–5.2)
SODIUM: 144 mmol/L (ref 134–144)
TOTAL PROTEIN: 6.2 g/dL (ref 6.0–8.5)

## 2018-01-17 LAB — T4, FREE: FREE T4: 1.69 ng/dL (ref 0.82–1.77)

## 2018-01-17 LAB — LIPID PANEL
CHOL/HDL RATIO: 3 ratio (ref 0.0–5.0)
CHOLESTEROL TOTAL: 124 mg/dL (ref 100–199)
HDL: 42 mg/dL (ref >39)
LDL CALC: 57 mg/dL (ref 0–99)
Triglycerides: 125 mg/dL (ref 0–149)
VLDL Cholesterol Cal: 25 mg/dL (ref 5–40)

## 2018-01-17 LAB — TSH: TSH: 2.92 u[IU]/mL (ref 0.450–4.500)

## 2018-01-23 ENCOUNTER — Ambulatory Visit (HOSPITAL_COMMUNITY)
Admission: RE | Admit: 2018-01-23 | Discharge: 2018-01-23 | Disposition: A | Discharge status: Home / Self Care | Payer: Medicare Other | Source: Ambulatory Visit | Attending: Family Medicine | Admitting: Family Medicine

## 2018-01-23 DIAGNOSIS — I10 Essential (primary) hypertension: Secondary | ICD-10-CM | POA: Diagnosis not present

## 2018-01-23 DIAGNOSIS — I2510 Atherosclerotic heart disease of native coronary artery without angina pectoris: Secondary | ICD-10-CM | POA: Insufficient documentation

## 2018-01-23 DIAGNOSIS — R0989 Other specified symptoms and signs involving the circulatory and respiratory systems: Secondary | ICD-10-CM

## 2018-01-23 DIAGNOSIS — I6523 Occlusion and stenosis of bilateral carotid arteries: Secondary | ICD-10-CM | POA: Diagnosis not present

## 2018-01-23 DIAGNOSIS — Z87891 Personal history of nicotine dependence: Secondary | ICD-10-CM | POA: Insufficient documentation

## 2018-01-23 DIAGNOSIS — E785 Hyperlipidemia, unspecified: Secondary | ICD-10-CM | POA: Diagnosis not present

## 2018-01-24 ENCOUNTER — Other Ambulatory Visit: Payer: Self-pay | Admitting: Family Medicine

## 2018-01-24 DIAGNOSIS — I6523 Occlusion and stenosis of bilateral carotid arteries: Secondary | ICD-10-CM

## 2018-01-24 LAB — CUP PACEART REMOTE DEVICE CHECK
Battery Remaining Longevity: 132 mo
Brady Statistic RA Percent Paced: 89 %
Brady Statistic RV Percent Paced: 1 %
HIGH POWER IMPEDANCE MEASURED VALUE: 76 Ohm
Implantable Lead Location: 753860
Implantable Lead Model: 293
Implantable Lead Model: 3830
Implantable Lead Serial Number: 433180
Lead Channel Impedance Value: 473 Ohm
Lead Channel Setting Pacing Amplitude: 2.4 V
Lead Channel Setting Pacing Amplitude: 2.5 V
MDC IDC LEAD IMPLANT DT: 20180703
MDC IDC LEAD IMPLANT DT: 20180703
MDC IDC LEAD LOCATION: 753859
MDC IDC MSMT BATTERY REMAINING PERCENTAGE: 100 %
MDC IDC MSMT LEADCHNL RV IMPEDANCE VALUE: 722 Ohm
MDC IDC PG IMPLANT DT: 20180703
MDC IDC SESS DTM: 20190403050100
MDC IDC SET LEADCHNL RV PACING PULSEWIDTH: 0.4 ms
MDC IDC SET LEADCHNL RV SENSING SENSITIVITY: 0.5 mV
Pulse Gen Serial Number: 533886

## 2018-01-26 DIAGNOSIS — J449 Chronic obstructive pulmonary disease, unspecified: Secondary | ICD-10-CM | POA: Diagnosis not present

## 2018-01-30 ENCOUNTER — Other Ambulatory Visit: Payer: Self-pay | Admitting: Family Medicine

## 2018-01-31 ENCOUNTER — Encounter: Payer: Self-pay | Admitting: Family Medicine

## 2018-01-31 ENCOUNTER — Ambulatory Visit (INDEPENDENT_AMBULATORY_CARE_PROVIDER_SITE_OTHER): Payer: Medicare Other | Admitting: Family Medicine

## 2018-01-31 ENCOUNTER — Other Ambulatory Visit: Payer: Self-pay

## 2018-01-31 VITALS — BP 139/53 | HR 59 | Temp 97.8°F | Ht 70.0 in | Wt 178.0 lb

## 2018-01-31 DIAGNOSIS — I6523 Occlusion and stenosis of bilateral carotid arteries: Secondary | ICD-10-CM

## 2018-01-31 DIAGNOSIS — H0100A Unspecified blepharitis right eye, upper and lower eyelids: Secondary | ICD-10-CM | POA: Diagnosis not present

## 2018-01-31 DIAGNOSIS — H02846 Edema of left eye, unspecified eyelid: Secondary | ICD-10-CM | POA: Diagnosis not present

## 2018-01-31 MED ORDER — TOBRAMYCIN-DEXAMETHASONE 0.3-0.1 % OP SUSP: OPHTHALMIC | 0 refills | Status: DC

## 2018-01-31 MED ORDER — BETAMETHASONE SOD PHOS & ACET 6 (3-3) MG/ML IJ SUSP
6.0000 mg | Freq: Once | INTRAMUSCULAR | Status: AC
  Administered 2018-01-31: 6 mg via INTRAMUSCULAR

## 2018-01-31 MED ORDER — AMOXICILLIN-POT CLAVULANATE 875-125 MG PO TABS: 1.0000 | ORAL_TABLET | Freq: Two times a day (BID) | ORAL | 0 refills | Status: DC

## 2018-01-31 NOTE — Addendum Note (Signed)
Addended by: Claretta Fraise on: 01/31/2018 10:04 AM   Modules accepted: Orders

## 2018-01-31 NOTE — Progress Notes (Addendum)
Chief Complaint  Patient presents with  . Eye Problem    pt here today c/o right eye red and swollen    HPI  Patient presents today for patient states that he was mowing 3 days ago and woke up the next day with the right eye being red.  He feels like there is something in it at the right corner of his eye.  There is a lot of swelling around it but his vision remains normal.  There is redness and swelling and itching but no pain.  PMH: Smoking status noted ROS: Per HPI  Objective: BP (!) 139/53   Pulse (!) 59   Temp 97.8 F (36.6 C) (Oral)   Ht 5\' 10"  (1.778 m)   Wt 178 lb (80.7 kg)   BMI 25.54 kg/m  Gen: NAD, alert, cooperative with exam HEENT: NCAT, EOMI, PERRL.  Marked chemosis.  Upper and lower lid.  The surface of the cornea is free of foreign body lid eversion is negative for foreign body the medial epicanthus is swollen and and contains moderate amounts of purulent mucoid matter CV: RRR, good S1/S2, no murmur Resp: CTABL, no wheezes, non-labored Ext: No edema, warm Neuro: Alert and oriented, No gross deficits  Assessment and plan:  1. 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 Edema of left eyelid     Meds ordered this encounter  Medications  . betamethasone acetate-betamethasone sodium phosphate (CELESTONE) injection 6 mg  . amoxicillin-clavulanate (AUGMENTIN) 875-125 MG tablet    Sig: Take 1 tablet by mouth 2 (two) times daily.    Dispense:  20 tablet    Refill:  0  . tobramycin-dexamethasone (TOBRADEX) ophthalmic solution    Sig: Apply 1 drop in affected eye(s) every 2 hours for two days. Then every 4 hours for 5 days.    Dispense:  5 mL    Refill:  0    No orders of the defined types were placed in this encounter.   Follow up as needed.  Claretta Fraise, MD

## 2018-02-07 ENCOUNTER — Telehealth: Payer: Self-pay | Admitting: *Deleted

## 2018-02-07 NOTE — Telephone Encounter (Signed)
[employer] alert reviewed. Attempted to call patient for sx's.  LMTCB w/wife.sss

## 2018-02-08 ENCOUNTER — Encounter: Payer: Medicare Other | Admitting: Surgery

## 2018-02-08 ENCOUNTER — Encounter (HOSPITAL_COMMUNITY): Payer: Medicare Other

## 2018-02-09 NOTE — Telephone Encounter (Signed)
Patient returned my call. Patient denies LEE, abdominal distention, or orthopnea. Patient states that he has noticed occasional ShOB x months. Overall patient states that he feels fine. I counseled patient about limiting sodium in his diet. Patient verbalized understanding.

## 2018-02-09 NOTE — Telephone Encounter (Signed)
Called again regarding alert.   Per wife - patient is currently @ gym. LM for patient to call once he returns. Direct # and name given.sss

## 2018-02-13 DIAGNOSIS — J449 Chronic obstructive pulmonary disease, unspecified: Secondary | ICD-10-CM | POA: Diagnosis not present

## 2018-02-24 ENCOUNTER — Ambulatory Visit (INDEPENDENT_AMBULATORY_CARE_PROVIDER_SITE_OTHER): Payer: Medicare Other | Admitting: Pharmacist Clinician (PhC)/ Clinical Pharmacy Specialist

## 2018-02-24 DIAGNOSIS — I482 Chronic atrial fibrillation, unspecified: Secondary | ICD-10-CM

## 2018-02-24 LAB — COAGUCHEK XS/INR WAIVED
INR: 2 — AB (ref 0.9–1.1)
Prothrombin Time: 23.8 s

## 2018-02-24 NOTE — Patient Instructions (Signed)
Description   Continue taking warfarin the same way.   Goal is 1.8-2.2  INR 2.0  today

## 2018-02-25 DIAGNOSIS — J449 Chronic obstructive pulmonary disease, unspecified: Secondary | ICD-10-CM | POA: Diagnosis not present

## 2018-03-03 ENCOUNTER — Encounter: Payer: Self-pay | Admitting: Vascular Surgery

## 2018-03-03 ENCOUNTER — Ambulatory Visit (HOSPITAL_COMMUNITY)
Admission: RE | Admit: 2018-03-03 | Discharge: 2018-03-03 | Disposition: A | Discharge status: Home / Self Care | Payer: Medicare Other | Source: Ambulatory Visit | Attending: Vascular Surgery | Admitting: Vascular Surgery

## 2018-03-03 ENCOUNTER — Ambulatory Visit (INDEPENDENT_AMBULATORY_CARE_PROVIDER_SITE_OTHER): Payer: Medicare Other | Admitting: Vascular Surgery

## 2018-03-03 VITALS — BP 162/64 | HR 62 | Temp 97.3°F | Resp 16 | Ht 70.0 in | Wt 175.0 lb

## 2018-03-03 DIAGNOSIS — I6523 Occlusion and stenosis of bilateral carotid arteries: Secondary | ICD-10-CM | POA: Insufficient documentation

## 2018-03-03 NOTE — Progress Notes (Signed)
xcv                                     Vascular and Vein Specialist of [facility]  Patient name: [name] [name] MRN: [id number] DOB: 1941/05/08 Sex: male  REASON FOR CONSULT: Discussion bilateral carotid stenosis  HPI: [name] [name] is a 77 y.o. male, who is today for discussion of bilateral carotid stenosis.  He denies any prior neurologic deficits.  Specifically no amaurosis fugax, transient ischemic attack or stroke.  He has multiple medical problems including congestive heart failure pacemaker and AICD.  He has chronic atrial fibrillation.  Also has chronic renal insufficiency.  He underwent duplex at [facility] discussed hospital demonstrating greater than 70% stenoses bilaterally.  Past Medical History:  Diagnosis Date  . 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 Anemia   . Arthritis    "hips; back" (07/09/2014)  . Asthma   . Atrial fibrillation ([place])   . CAD (coronary artery disease)   . CHF (congestive heart failure) ([place])   . Cholecystitis 11/2013  . 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   . COPD (chronic obstructive pulmonary disease) (HCC)    on home oxygen, 2 liters at night10/2015  . Esophageal reflux   . Gallstones   . Gastric antral vascular ectasia 2013  . Gout   . Hepatomegaly   . Hiatal hernia   . History of blood transfusion ~ 2012   "blood count dropped; had to get 3 units"  . Hyperlipidemia   . Hypothyroidism   . Other and unspecified coagulation defects   . Personal history of colonic polyps 05/29/2010   TUBULAR ADENOMA  . Unspecified essential hypertension     Family History  Problem Relation Age of Onset  . Leukemia Mother   . Kidney disease Mother        kidney removed   . Heart attack Brother   . Heart disease Brother   . Heart disease Father   . Stomach cancer Sister   . Stomach cancer Sister   . Kaho Selle death Brother   . Hypertension Unknown        family   . Colon cancer Neg Hx     SOCIAL  HISTORY: Social History   Socioeconomic History  . Marital status: Married    Spouse name: [name]  . Number of children: 2  . Years of education: Not on file  . Highest education level: Not on file  Occupational History  . Occupation: retired    Comment: [employer]  Social Needs  . Financial resource strain: Not on file  . Food insecurity:    Worry: Not on file    Inability: Not on file  . Transportation needs:    Medical: Not on file    Non-medical: Not on file  Tobacco Use  . Smoking status: Former Smoker    Packs/day: 1.00    Years: 42.00    Pack years: 42.00    Types: Cigarettes    Last attempt to quit: 12/16/1996    Years since quitting: 21.2  . Smokeless tobacco: Never Used  Substance and Sexual Activity  . Alcohol use: Yes    Alcohol/week: 0.6 oz    Types: 1 Cans of beer per week    Comment: 1 per week  . Drug use: No  . Sexual activity: Never  Lifestyle  . Physical activity:  Days per week: Not on file    Minutes per session: Not on file  . Stress: Not on file  Relationships  . Social connections:    Talks on phone: Not on file    Gets together: Not on file    Attends religious service: Not on file    Active member of club or organization: Not on file    Attends meetings of clubs or organizations: Not on file    Relationship status: Not on file  . Intimate partner violence:    Fear of current or ex partner: Not on file    Emotionally abused: Not on file    Physically abused: Not on file    Forced sexual activity: Not on file  Other Topics Concern  . Not on file  Social History Narrative  . Not on file    No Known Allergies  Current Outpatient Medications  Medication Sig Dispense Refill  . amoxicillin-clavulanate (AUGMENTIN) 875-125 MG tablet Take 1 tablet by mouth 2 (two) times daily. 20 tablet 0  . budesonide (PULMICORT) 0.25 MG/2ML nebulizer solution Take 0.25 mg by nebulization 3 (three) times daily as needed (shortness of  breath).     . carvedilol (COREG) 3.125 MG tablet Take 1 tablet (3.125 mg total) by mouth 2 (two) times daily with a meal. 180 tablet 3  . Cholecalciferol (VITAMIN D3) 5000 UNITS CAPS Take 5,000 Units by mouth daily.     Marland Kitchen docusate sodium (COLACE) 100 MG capsule Take 100 mg by mouth daily.     . ferrous sulfate 325 (65 FE) MG tablet Take 325 mg by mouth 2 (two) times daily.    . furosemide (LASIX) 40 MG tablet Take 0.5 tablets (20 mg total) by mouth daily.    . hydrALAZINE (APRESOLINE) 50 MG tablet Take 1 tablet (50 mg total) by mouth 3 (three) times daily. 180 tablet 3  . Hypromell-Glycerin-Naphazoline (CLEAR EYES FOR DRY EYES PLUS) 0.8-0.25-0.012 % SOLN Place 1-2 drops into both eyes 3 (three) times daily as needed (for dry eyes.).    Marland Kitchen ipratropium-albuterol (DUONEB) 0.5-2.5 (3) MG/3ML SOLN Take 3 mLs by nebulization 3 (three) times daily as needed (for shortness of breath/wheezing.). For shortness of breath.     . levothyroxine (SYNTHROID, LEVOTHROID) 112 MCG tablet Take 1 tablet (112 mcg total) by mouth daily before breakfast. 90 tablet 1  . loratadine (CLARITIN) 10 MG tablet Take 10 mg by mouth daily as needed (for allergies.).     Marland Kitchen lovastatin (MEVACOR) 40 MG tablet TAKE 2 TABLETS BY MOUTH AT  BEDTIME 180 tablet 3  . Multiple Vitamins-Minerals (MULTIVITAMINS THER. W/MINERALS) TABS Take 1 tablet by mouth daily.      . nitroGLYCERIN (NITROSTAT) 0.4 MG SL tablet Place 1 tablet (0.4 mg total) under the tongue every 5 (five) minutes as needed for chest pain. 6 tablet 1  . pantoprazole (PROTONIX) 40 MG tablet Take 1 tablet (40 mg total) by mouth 2 (two) times daily. 180 tablet 1  . PROAIR HFA 108 (90 Base) MCG/ACT inhaler Use 2 puffs every 6 hours  as needed for shortness of  breath 34 g 3  . sacubitril-valsartan (ENTRESTO) 97-103 MG Take 1 tablet by mouth 2 (two) times daily. 180 tablet 3  . spironolactone (ALDACTONE) 25 MG tablet TAKE 0.5 TABLETS (12.5 MG TOTAL) BY MOUTH DAILY. 15 tablet 3  .  sucralfate (CARAFATE) 1 g tablet Take 1 tablet (1 g total) by mouth 2 (two) times daily. With breakfast and supper. (Patient  taking differently: Take 1 g by mouth 4 (four) times daily -  with meals and at bedtime. With breakfast and supper.) 360 tablet 1  . tobramycin-dexamethasone (TOBRADEX) ophthalmic solution Apply 1 drop in affected eye(s) every 2 hours for two days. Then every 4 hours for 5 days. 5 mL 0  . warfarin (COUMADIN) 5 MG tablet TAKE 1/2 TABLET BY MOUTH  (2.5MG ) ON MONDAY,  WEDNESDAY, AND FRIDAY AND 1 TABLET (5MG ) ALL OTHER DAYS 90 tablet 0   No current facility-administered medications for this visit.     REVIEW OF SYSTEMS:  [X]  denotes positive finding, [ ]  denotes negative finding Cardiac  Comments:  Chest pain or chest pressure:    Shortness of breath upon exertion: x   Short of breath when lying flat:    Irregular heart rhythm:        Vascular    Pain in calf, thigh, or hip brought on by ambulation: x   Pain in feet at night that wakes you up from your sleep:  x   Blood clot in your veins:    Leg swelling:         Pulmonary    Oxygen at home: x   Productive cough:     Wheezing:         Neurologic    Sudden weakness in arms or legs:  x   Sudden numbness in arms or legs:     Sudden onset of difficulty speaking or slurred speech:    Temporary loss of vision in one eye:     Problems with dizziness:         Gastrointestinal    Blood in stool:     Vomited blood:         Genitourinary    Burning when urinating:     Blood in urine:        Psychiatric    Major depression:         Hematologic    Bleeding problems:    Problems with blood clotting too easily:        Skin    Rashes or ulcers:        Constitutional    Fever or chills:      PHYSICAL EXAM: Vitals:   03/03/18 1200 03/03/18 1203  BP: (!) 166/62 (!) 162/64  Pulse: 62   Resp: 16   Temp: (!) 97.3 F (36.3 C)   TempSrc: Oral   SpO2: 97%   Weight: 175 lb (79.4 kg)   Height: 5\' 10"  (1.778  m)     GENERAL: The patient is a well-nourished male, in no acute distress. The vital signs are documented above. CARDIOVASCULAR: I do not hear carotid bruits bilaterally.  He does have 2+ radial pulses bilaterally PULMONARY: There is good air exchange  ABDOMEN: Soft and non-tender  MUSCULOSKELETAL: There are no major deformities or cyanosis. NEUROLOGIC: No focal weakness or paresthesias are detected. SKIN: There are no ulcers or rashes noted. PSYCHIATRIC: The patient has a normal affect.  DATA:  Duplex from [facility].  He also underwent a repeat duplex in our office today.  This suggested similar velocities.  His stenosis by our lab is in the 60 to 79% stenosis level bilaterally.  MEDICAL ISSUES: Long discussion with patient regarding his carotid stenosis.  He is asymptomatic.  I describes symptoms of carotid disease to him.  I explained that he is below the threshold of where we would recommend surgery for asymptomatic disease.  At his level we would recommend 35-month interval duplex lifelong to rule out progression of stenosis.  If he does have symptoms suggestive carotid disease, he will present immediately to the emergency room for further evaluation.  Otherwise we will see him in 6 months with repeat carotid duplex   Rosetta Posner, MD [facility] Vascular and Vein Specialists of [facility] Tel [phone] Pager [phone]

## 2018-03-07 ENCOUNTER — Other Ambulatory Visit (HOSPITAL_COMMUNITY): Payer: Self-pay | Admitting: Cardiology

## 2018-03-14 ENCOUNTER — Telehealth: Payer: Self-pay

## 2018-03-14 DIAGNOSIS — J449 Chronic obstructive pulmonary disease, unspecified: Secondary | ICD-10-CM | POA: Diagnosis not present

## 2018-03-14 NOTE — Telephone Encounter (Signed)
Referred to ICM clinic by Ria Clock, device RN due to [facility] is trending above normal threshold since 01/27/2018 but is improving.  Threshold has dropped from 23 on 03/04/2018 to 17/2019 on 03/13/2018.  Attempted patient call and left message to return call.

## 2018-03-28 DIAGNOSIS — J449 Chronic obstructive pulmonary disease, unspecified: Secondary | ICD-10-CM | POA: Diagnosis not present

## 2018-03-30 NOTE — Telephone Encounter (Signed)
Attempted ICM intro call and left patient a message to return call.

## 2018-04-03 ENCOUNTER — Ambulatory Visit: Payer: Medicare Other | Admitting: Internal Medicine

## 2018-04-03 ENCOUNTER — Other Ambulatory Visit: Payer: Self-pay | Admitting: Family Medicine

## 2018-04-03 ENCOUNTER — Encounter: Payer: Self-pay | Admitting: Internal Medicine

## 2018-04-03 VITALS — BP 135/50 | HR 60 | Ht 70.0 in | Wt 175.0 lb

## 2018-04-03 DIAGNOSIS — I482 Chronic atrial fibrillation, unspecified: Secondary | ICD-10-CM

## 2018-04-03 DIAGNOSIS — Z95810 Presence of automatic (implantable) cardiac defibrillator: Secondary | ICD-10-CM | POA: Diagnosis not present

## 2018-04-03 DIAGNOSIS — I10 Essential (primary) hypertension: Secondary | ICD-10-CM | POA: Diagnosis not present

## 2018-04-03 DIAGNOSIS — I5022 Chronic systolic (congestive) heart failure: Secondary | ICD-10-CM

## 2018-04-03 NOTE — Progress Notes (Signed)
HPI Mr. Bellucci returns today for ongoing evaluation and management of his chronic systolic heart failure, chronic atrial fibrillation, coronary artery disease, renal insufficiency, status post ICD implantation. He has asymptomatic carotid disease. In the interim, the patient is been stable. He is able to play golf but notes the day after he plays that he is more tired. After physical activity he takes about 24 hours before his energy improved. He denies shortness of breath or chest pain. No peripheral edema. No ICD shocks.   No Known Allergies   Current Outpatient Medications  Medication Sig Dispense Refill  . amoxicillin-clavulanate (AUGMENTIN) 875-125 MG tablet Take 1 tablet by mouth 2 (two) times daily. 20 tablet 0  . budesonide (PULMICORT) 0.25 MG/2ML nebulizer solution Take 0.25 mg by nebulization 3 (three) times daily as needed (shortness of breath).     . carvedilol (COREG) 3.125 MG tablet Take 1 tablet (3.125 mg total) by mouth 2 (two) times daily with a meal. 180 tablet 3  . Cholecalciferol (VITAMIN D3) 5000 UNITS CAPS Take 5,000 Units by mouth daily.     Marland Kitchen docusate sodium (COLACE) 100 MG capsule Take 100 mg by mouth daily.     . ferrous sulfate 325 (65 FE) MG tablet Take 325 mg by mouth 2 (two) times daily.    . furosemide (LASIX) 40 MG tablet Take 0.5 tablets (20 mg total) by mouth daily.    . hydrALAZINE (APRESOLINE) 50 MG tablet Take 1 tablet (50 mg total) by mouth 3 (three) times daily. 180 tablet 3  . Hypromell-Glycerin-Naphazoline (CLEAR EYES FOR DRY EYES PLUS) 0.8-0.25-0.012 % SOLN Place 1-2 drops into both eyes 3 (three) times daily as needed (for dry eyes.).    Marland Kitchen ipratropium-albuterol (DUONEB) 0.5-2.5 (3) MG/3ML SOLN Take 3 mLs by nebulization 3 (three) times daily as needed (for shortness of breath/wheezing.). For shortness of breath.     . levothyroxine (SYNTHROID, LEVOTHROID) 112 MCG tablet Take 1 tablet (112 mcg total) by mouth daily before breakfast. 90 tablet 1  .  loratadine (CLARITIN) 10 MG tablet Take 10 mg by mouth daily as needed (for allergies.).     Marland Kitchen lovastatin (MEVACOR) 40 MG tablet TAKE 2 TABLETS BY MOUTH AT  BEDTIME 180 tablet 3  . Multiple Vitamins-Minerals (MULTIVITAMINS THER. W/MINERALS) TABS Take 1 tablet by mouth daily.      . nitroGLYCERIN (NITROSTAT) 0.4 MG SL tablet Place 1 tablet (0.4 mg total) under the tongue every 5 (five) minutes as needed for chest pain. 6 tablet 1  . pantoprazole (PROTONIX) 40 MG tablet Take 1 tablet (40 mg total) by mouth 2 (two) times daily. 180 tablet 1  . PROAIR HFA 108 (90 Base) MCG/ACT inhaler Use 2 puffs every 6 hours  as needed for shortness of  breath 34 g 3  . sacubitril-valsartan (ENTRESTO) 97-103 MG Take 1 tablet by mouth 2 (two) times daily. 180 tablet 3  . sacubitril-valsartan (ENTRESTO) 97-103 MG Take 1 tablet by mouth 2 (two) times daily. Please call for office visit 60 tablet 3  . spironolactone (ALDACTONE) 25 MG tablet TAKE 0.5 TABLETS (12.5 MG TOTAL) BY MOUTH DAILY. 15 tablet 3  . sucralfate (CARAFATE) 1 g tablet Take 1 tablet (1 g total) by mouth 2 (two) times daily. With breakfast and supper. (Patient taking differently: Take 1 g by mouth 4 (four) times daily -  with meals and at bedtime. With breakfast and supper.) 360 tablet 1  . tobramycin-dexamethasone (TOBRADEX) ophthalmic solution Apply 1 drop  in affected eye(s) every 2 hours for two days. Then every 4 hours for 5 days. 5 mL 0  . warfarin (COUMADIN) 5 MG tablet TAKE 1/2 TABLET BY MOUTH  (2.5MG ) ON MONDAY,  WEDNESDAY, AND FRIDAY AND 1 TABLET (5MG ) ALL OTHER DAYS 90 tablet 0   No current facility-administered medications for this visit.      Past Medical History:  Diagnosis Date  . 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 Anemia   . Arthritis    "hips; back" (07/09/2014)  . Asthma   . Atrial fibrillation ([place])   . CAD (coronary artery disease)   . CHF (congestive heart failure) ([place])   . Cholecystitis 11/2013    . 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   . COPD (chronic obstructive pulmonary disease) (H[facility])    on home oxygen, 2 liters at night10/2015  . Esophageal reflux   . Gallstones   . Gastric antral vascular ectasia 2013  . Gout   . Hepatomegaly   . Hiatal hernia   . History of blood transfusion ~ 2012   "blood count dropped; had to get 3 units"  . Hyperlipidemia   . Hypothyroidism   . Other and unspecified coagulation defects   . Personal history of colonic polyps 05/29/2010   TUBULAR ADENOMA  . Unspecified essential hypertension     ROS:   All systems reviewed and negative except as noted in the HPI.   Past Surgical History:  Procedure Laterality Date  . CARDIAC CATHETERIZATION N/A 07/15/2016   Procedure: Right/Left Heart Cath and Coronary/Graft Angiography;  Surgeon: Belva Crome, MD;  Location: [facility];  Service: Cardiovascular;  Laterality: N/A;  . cholecystomy tube  12/28/2014 - 01/06/2015   tube clogged with debris, removed and IR unable to place new tube.   . COLONOSCOPY  2013   Dr. Sharlett Iles: no polyps or evidence of active bleeding  . CORONARY ANGIOPLASTY WITH STENT PLACEMENT  ~ 2008; 07/08/2014   "1 + 3"  . CORONARY ARTERY BYPASS GRAFT  1998   CABG X4  . ESOPHAGOGASTRODUODENOSCOPY  2013   Dr. 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 ESOPHAGOGASTRODUODENOSCOPY N/A 12/12/2015   Procedure: ESOPHAGOGASTRODUODENOSCOPY (EGD);  Surgeon: Gatha Mayer, MD;  Location: [facility];  Service: Endoscopy;  Laterality: N/A;  . GIVENS CAPSULE STUDY  2013   Dr. Sharlett Iles: AVM at 48 and blood at 30 minutes beyond first duodenal image but not actual lesion seen. If persistent IDA, bleeding, recommend enteroscopy with ablation   . HERNIA REPAIR    . ICD IMPLANT N/A 04/05/2017   Procedure: ICD Implant;  Surgeon: Evans Lance, MD;  Location: [facility];  Service: Cardiovascular;  Laterality: N/A;  . LEFT AND  RIGHT HEART CATHETERIZATION WITH CORONARY/GRAFT ANGIOGRAM N/A 07/09/2014   Right and left heart cath, bare metal stent to SVG to RCA. Sinclair Grooms, MD;   . UMBILICAL HERNIA REPAIR  [phone]     Family History  Problem Relation Age of Onset  . Leukemia Mother   . Kidney disease Mother        kidney removed   . Heart attack Brother   . Heart disease Brother   . Heart disease Father   . Stomach cancer Sister   . Stomach cancer Sister   . Early death Brother   . Hypertension Unknown        family   . Colon cancer Neg  Hx      Social History   Socioeconomic History  . Marital status: Married    Spouse name: [name]  . Number of children: 2  . Years of education: Not on file  . Highest education level: Not on file  Occupational History  . Occupation: retired    Comment: [employer]  Social Needs  . Financial resource strain: Not on file  . Food insecurity:    Worry: Not on file    Inability: Not on file  . Transportation needs:    Medical: Not on file    Non-medical: Not on file  Tobacco Use  . Smoking status: Former Smoker    Packs/day: 1.00    Years: 42.00    Pack years: 42.00    Types: Cigarettes    Last attempt to quit: 12/16/1996    Years since quitting: 21.3  . Smokeless tobacco: Never Used  Substance and Sexual Activity  . Alcohol use: Yes    Alcohol/week: 0.6 oz    Types: 1 Cans of beer per week    Comment: 1 per week  . Drug use: No  . Sexual activity: Never  Lifestyle  . Physical activity:    Days per week: Not on file    Minutes per session: Not on file  . Stress: Not on file  Relationships  . Social connections:    Talks on phone: Not on file    Gets together: Not on file    Attends religious service: Not on file    Active member of club or organization: Not on file    Attends meetings of clubs or organizations: Not on file    Relationship status: Not on file  . Intimate partner violence:    Fear of current or ex partner: Not on  file    Emotionally abused: Not on file    Physically abused: Not on file    Forced sexual activity: Not on file  Other Topics Concern  . Not on file  Social History Narrative  . Not on file     BP (!) 135/50   Pulse 60   Ht 5\' 10"  (1.778 m)   Wt 175 lb (79.4 kg)   SpO2 95%   BMI 25.11 kg/m   Physical Exam:  Well appearing NAD HEENT: Unremarkable Neck:  No JVD, no thyromegally Lymphatics:  No adenopathy Back:  No CVA tenderness Lungs:  Clear with no wheezes CV: regular rate rhythm, no murmurs, no rubs, no clicks Abd:  soft, positive bowel sounds, no organomegally, no rebound, no guarding Ext:  2 plus pulses, no edema, no cyanosis, no clubbing Skin:  No rashes no nodules Neuro:  CN II through XII intact, motor grossly intact  EKG - atrial fib with His bundle pacing  DEVICE  Normal device function.  See PaceArt for details.   Assess/Plan: 1. Chronic systolic heart failure - his symptoms are class 2. He will continue his current meds. 2. Atrial fib - his ventricular rate is well controlled. He will continue warfarin 3. ICD - interogation of his ICD demonstrates normal device function. His atrial lead is pacing the His bundle. 4. 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 No change in meds today.   Mikle Bosworth.D.

## 2018-04-03 NOTE — Patient Instructions (Addendum)
Medication Instructions:  Your physician recommends that you continue on your current medications as directed. Please refer to the Current Medication list given to you today.  Labwork: None ordered.  Testing/Procedures: None ordered.  Follow-Up: Your physician wants you to follow-up in: 1 year with Dr. Lovena Le.   You will receive a reminder letter in the mail two months in advance. If you don't receive a letter, please call our office to schedule the follow-up appointment.  Remote monitoring is used to monitor your ICD from home. This monitoring reduces the number of office visits required to check your device to one time per year. It allows [place] to keep an eye on the functioning of your device to ensure it is working properly. You are scheduled for a device check from home on 04/05/2018. You may send your transmission at any time that day. If you have a wireless device, the transmission will be sent automatically. After your physician reviews your transmission, you will receive a postcard with your next transmission date.  Any Other Special Instructions Will Be Listed Below (If Applicable).  If you need a refill on your cardiac medications before your next appointment, please call your pharmacy.

## 2018-04-05 ENCOUNTER — Ambulatory Visit (INDEPENDENT_AMBULATORY_CARE_PROVIDER_SITE_OTHER): Payer: Medicare Other | Admitting: *Deleted

## 2018-04-05 DIAGNOSIS — I5022 Chronic systolic (congestive) heart failure: Secondary | ICD-10-CM | POA: Diagnosis not present

## 2018-04-05 DIAGNOSIS — I482 Chronic atrial fibrillation, unspecified: Secondary | ICD-10-CM

## 2018-04-05 NOTE — Progress Notes (Signed)
Remote ICD transmission.   

## 2018-04-17 ENCOUNTER — Encounter: Payer: Self-pay | Admitting: Family Medicine

## 2018-04-17 ENCOUNTER — Ambulatory Visit (INDEPENDENT_AMBULATORY_CARE_PROVIDER_SITE_OTHER): Payer: Medicare Other | Admitting: Family Medicine

## 2018-04-17 ENCOUNTER — Telehealth: Payer: Self-pay | Admitting: Family Medicine

## 2018-04-17 VITALS — BP 152/56 | HR 68 | Temp 96.8°F | Ht 70.0 in | Wt 177.4 lb

## 2018-04-17 DIAGNOSIS — I5022 Chronic systolic (congestive) heart failure: Secondary | ICD-10-CM | POA: Diagnosis not present

## 2018-04-17 DIAGNOSIS — J449 Chronic obstructive pulmonary disease, unspecified: Secondary | ICD-10-CM

## 2018-04-17 DIAGNOSIS — R0683 Snoring: Secondary | ICD-10-CM

## 2018-04-17 DIAGNOSIS — I482 Chronic atrial fibrillation, unspecified: Secondary | ICD-10-CM

## 2018-04-17 LAB — CBC WITH DIFFERENTIAL/PLATELET
BASOS ABS: 0.1 10*3/uL (ref 0.0–0.2)
Basos: 1 %
EOS (ABSOLUTE): 0.2 10*3/uL (ref 0.0–0.4)
Eos: 2 %
HEMOGLOBIN: 12.5 g/dL — AB (ref 13.0–17.7)
Hematocrit: 38.7 % (ref 37.5–51.0)
IMMATURE GRANS (ABS): 0 10*3/uL (ref 0.0–0.1)
Immature Granulocytes: 0 %
LYMPHS: 13 %
Lymphocytes Absolute: 1.3 10*3/uL (ref 0.7–3.1)
MCH: 32.5 pg (ref 26.6–33.0)
MCHC: 32.3 g/dL (ref 31.5–35.7)
MCV: 101 fL — ABNORMAL HIGH (ref 79–97)
MONOCYTES: 12 %
Monocytes Absolute: 1.2 10*3/uL — ABNORMAL HIGH (ref 0.1–0.9)
Neutrophils Absolute: 7.3 10*3/uL — ABNORMAL HIGH (ref 1.4–7.0)
Neutrophils: 72 %
PLATELETS: 307 10*3/uL (ref 150–450)
RBC: 3.85 x10E6/uL — AB (ref 4.14–5.80)
RDW: 13 % (ref 12.3–15.4)
WBC: 10.1 10*3/uL (ref 3.4–10.8)

## 2018-04-17 LAB — COAGUCHEK XS/INR WAIVED
INR: 2.3 — ABNORMAL HIGH (ref 0.9–1.1)
Prothrombin Time: 27.3 s

## 2018-04-17 NOTE — Telephone Encounter (Signed)
Will fax order once signed Placed on Dr.Stacks desk to sign

## 2018-04-17 NOTE — Patient Instructions (Signed)

## 2018-04-18 LAB — CUP PACEART INCLINIC DEVICE CHECK
Battery Remaining Percentage: 100 %
Brady Statistic RA Percent Paced: 93 %
Brady Statistic RV Percent Paced: 1 %
Date Time Interrogation Session: 20190708050000
HighPow Impedance: 76 Ohm
Implantable Lead Implant Date: 20180703
Implantable Lead Implant Date: 20180703
Implantable Lead Model: 293
Implantable Lead Model: 3830
Implantable Lead Serial Number: 433180
Implantable Pulse Generator Implant Date: 20180703
Lead Channel Impedance Value: 489 Ohm
Lead Channel Impedance Value: 805 Ohm
Lead Channel Pacing Threshold Amplitude: 1.3 V
Lead Channel Pacing Threshold Pulse Width: 0.4 ms
Lead Channel Setting Pacing Amplitude: 2.4 V
Lead Channel Setting Pacing Amplitude: 2.5 V
Lead Channel Setting Pacing Pulse Width: 0.4 ms
MDC IDC LEAD LOCATION: 753859
MDC IDC LEAD LOCATION: 753860
MDC IDC MSMT BATTERY REMAINING LONGEVITY: 120 mo
MDC IDC MSMT LEADCHNL RA PACING THRESHOLD PULSEWIDTH: 1 ms
MDC IDC MSMT LEADCHNL RV PACING THRESHOLD AMPLITUDE: 0.7 V
MDC IDC SET LEADCHNL RV SENSING SENSITIVITY: 0.5 mV
Pulse Gen Serial Number: 533886

## 2018-04-20 ENCOUNTER — Encounter: Payer: Self-pay | Admitting: Family Medicine

## 2018-04-20 NOTE — Progress Notes (Signed)
Subjective:  Patient ID: [name] [name], male    DOB: [birth year]  Age: 77 y.o. MRN: [id number]  CC: Follow-up (pt here today for PROTIME)   HPI [name] [name] presents for Patient in for follow-up of atrial fibrillation. Patient denies any recent bouts of chest pain or palpitations. Additionally, patient is taking anticoagulants. Patient denies any recent excessive bleeding episodes including epistaxis, bleeding from the gums, genitalia, rectal bleeding or hematuria. Additionally there has been no excessive bruising.  Also followed for congestive heart for you.  He has been having moderate swelling.  Continues use diuretics and Entresto.  Under care of cardiology.  Depression screen [facility] 2/9 04/17/2018 01/31/2018 01/16/2018  Decreased Interest 0 2 2  Down, Depressed, Hopeless 0 0 0  PHQ - 2 Score 0 2 2  Altered sleeping - 0 0  Tired, decreased energy - 2 2  Change in appetite - 0 0  Feeling bad or failure about yourself  - 0 0  Trouble concentrating - 0 0  Moving slowly or fidgety/restless - 0 0  Suicidal thoughts - 0 0  PHQ-9 Score - 4 4  Some recent data might be hidden    History [name] [name] has a past medical history of AICD (automatic cardioverter/defibrillator) present (04/05/2017), Anemia, Arthritis, Asthma, Atrial fibrillation ([place]), CAD (coronary artery disease), CHF (congestive heart failure) ([place]), Cholecystitis (11/2013), CKD (chronic kidney disease) (2015), COPD (chronic obstructive pulmonary disease) ([place]), Esophageal reflux, Gallstones, Gastric antral vascular ectasia (2013), Gout, Hepatomegaly, Hiatal hernia, History of blood transfusion (~ 2012), Hyperlipidemia, Hypothyroidism, Other and unspecified coagulation defects, Personal history of colonic polyps (05/29/2010), and Unspecified essential hypertension.   He has a past surgical history that includes Coronary angioplasty with stent (~ 2008; 07/08/2014); Coronary artery bypass graft (1998); Hernia repair; Umbilical hernia  repair (1970's); left and right heart catheterization with coronary/graft angiogram (N/A, 07/09/2014); Colonoscopy (2013); Esophagogastroduodenoscopy (2013); Givens capsule study (2013); cholecystomy tube (12/28/2014 - 01/06/2015); Esophagogastroduodenoscopy (N/A, 12/12/2015); Cardiac catheterization (N/A, 07/15/2016); and ICD IMPLANT (N/A, 04/05/2017).   His family history includes Early death in his brother; Heart attack in his brother; Heart disease in his brother and father; Hypertension in his unknown relative; Kidney disease in his mother; Leukemia in his mother; Stomach cancer in his sister and sister.He reports that he quit smoking about 21 years ago. His smoking use included cigarettes. He has a 42.00 pack-year smoking history. He has never used smokeless tobacco. He reports that he drinks about 0.6 oz of alcohol per week. He reports that he does not use drugs.    ROS Review of Systems  Constitutional: Negative.   HENT: Negative.   Eyes: Negative for visual disturbance.  Respiratory: Negative for cough and shortness of breath.   Cardiovascular: Negative for chest pain and leg swelling.  Gastrointestinal: Negative for abdominal pain, diarrhea, nausea and vomiting.  Genitourinary: Negative for difficulty urinating.  Musculoskeletal: Negative for arthralgias and myalgias.  Skin: Negative for rash.  Neurological: Negative for headaches.  Psychiatric/Behavioral: Negative for sleep disturbance.    Objective:  BP (!) 152/56   Pulse 68   Temp (!) 96.8 F (36 C) (Oral)   Ht 5\' 10"  (1.778 m)   Wt 177 lb 6 oz (80.5 kg)   BMI 25.45 kg/m   BP Readings from Last 3 Encounters:  04/17/18 (!) 152/56  04/03/18 (!) 135/50  03/03/18 (!) 162/64    Wt Readings from Last 3 Encounters:  04/17/18 177 lb 6 oz (80.5 kg)  04/03/18 175 lb (79.4 kg)  03/03/18 175 lb (79.4 kg)     Physical Exam  Constitutional: He is oriented to person, place, and time. He appears well-developed and well-nourished.  No distress.  HENT:  Head: Normocephalic and atraumatic.  Right Ear: External ear normal.  Left Ear: External ear normal.  Nose: Nose normal.  Mouth/Throat: Oropharynx is clear and moist.  Eyes: Pupils are equal, round, and reactive to light. Conjunctivae and EOM are normal.  Neck: Normal range of motion. Neck supple.  Cardiovascular: Normal rate, regular rhythm and normal heart sounds.  No murmur heard. Pulmonary/Chest: Effort normal. No respiratory distress. He has no wheezes. He has rales (Few at bases).  Abdominal: Soft. There is no tenderness.  Musculoskeletal: Normal range of motion. He exhibits edema.  Neurological: He is alert and oriented to person, place, and time. He has normal reflexes.  Skin: Skin is warm and dry.  Psychiatric: He has a normal mood and affect. His behavior is normal. Judgment and thought content normal.      Assessment & Plan:   [name] [name] was seen today for follow-up.  Diagnoses and all orders for this visit:  Atrial fibrillation, chronic (HCC) -     CoaguChek XS/INR Waived -     CBC with Differential/Platelet  Asthma with COPD ([place]) -     DME Other see comment  Chronic systolic CHF (congestive heart failure) ([place])       I am having [name] R. Lempke "[name]" maintain his ipratropium-albuterol, budesonide, multivitamins ther. w/minerals, Vitamin D3, ferrous sulfate, docusate sodium, PROAIR HFA, loratadine, nitroGLYCERIN, Hypromell-Glycerin-Naphazoline, carvedilol, sacubitril-valsartan, sucralfate, lovastatin, hydrALAZINE, spironolactone, levothyroxine, pantoprazole, warfarin, and furosemide.  Allergies as of 04/17/2018   No Known Allergies     Medication List        Accurate as of 04/17/18 11:59 PM. Always use your most recent med list.          budesonide 0.25 MG/2ML nebulizer solution Commonly known as:  PULMICORT Take 0.25 mg by nebulization 3 (three) times daily as needed (shortness of breath).   carvedilol 3.125 MG  tablet Commonly known as:  COREG Take 1 tablet (3.125 mg total) by mouth 2 (two) times daily with a meal.   CLEAR EYES FOR DRY EYES PLUS 0.8-0.25-0.012 % Soln Generic drug:  Hypromell-Glycerin-Naphazoline Place 1-2 drops into both eyes 3 (three) times daily as needed (for dry eyes.).   docusate sodium 100 MG capsule Commonly known as:  COLACE Take 100 mg by mouth daily.   ferrous sulfate 325 (65 FE) MG tablet Take 325 mg by mouth 2 (two) times daily.   furosemide 40 MG tablet Commonly known as:  LASIX TAKE 1 TABLET BY MOUTH  DAILY   hydrALAZINE 50 MG tablet Commonly known as:  APRESOLINE Take 1 tablet (50 mg total) by mouth 3 (three) times daily.   ipratropium-albuterol 0.5-2.5 (3) MG/3ML Soln Commonly known as:  DUONEB Take 3 mLs by nebulization 3 (three) times daily as needed (for shortness of breath/wheezing.). For shortness of breath.   levothyroxine 112 MCG tablet Commonly known as:  SYNTHROID, LEVOTHROID Take 1 tablet (112 mcg total) by mouth daily before breakfast.   loratadine 10 MG tablet Commonly known as:  CLARITIN Take 10 mg by mouth daily as needed (for allergies.).   lovastatin 40 MG tablet Commonly known as:  MEVACOR TAKE 2 TABLETS BY MOUTH AT  BEDTIME   multivitamins ther. w/minerals Tabs tablet Take 1 tablet by mouth daily.   nitroGLYCERIN 0.4 MG SL tablet Commonly known as:  NITROSTAT  Place 1 tablet (0.4 mg total) under the tongue every 5 (five) minutes as needed for chest pain.   pantoprazole 40 MG tablet Commonly known as:  PROTONIX Take 1 tablet (40 mg total) by mouth 2 (two) times daily.   PROAIR HFA 108 (90 Base) MCG/ACT inhaler Generic drug:  albuterol Use 2 puffs every 6 hours  as needed for shortness of  breath   sacubitril-valsartan 97-103 MG Commonly known as:  ENTRESTO Take 1 tablet by mouth 2 (two) times daily.   spironolactone 25 MG tablet Commonly known as:  ALDACTONE TAKE 0.5 TABLETS (12.5 MG TOTAL) BY MOUTH DAILY.    sucralfate 1 g tablet Commonly known as:  CARAFATE Take 1 tablet (1 g total) by mouth 2 (two) times daily. With breakfast and supper.   Vitamin D3 5000 units Caps Take 5,000 Units by mouth daily.   warfarin 5 MG tablet Commonly known as:  COUMADIN Take as directed by the anticoagulation clinic. If you are unsure how to take this medication, talk to your nurse or doctor. Original instructions:  TAKE 1/2 TABLET BY MOUTH  (2.5MG ) ON MONDAY,  WEDNESDAY, AND FRIDAY AND 1 TABLET (5MG ) ALL OTHER DAYS            Durable Medical Equipment  (From admission, onward)        Start     Ordered   04/17/18 0000  DME Other see comment    Comments:  Portable oxygen Compressor   04/17/18 1104       Follow-up: Return in about 1 month (around 05/18/2018).  Claretta Fraise, M.D.

## 2018-04-21 ENCOUNTER — Encounter: Payer: Self-pay | Admitting: Pharmacist Clinician (PhC)/ Clinical Pharmacy Specialist

## 2018-04-27 DIAGNOSIS — J449 Chronic obstructive pulmonary disease, unspecified: Secondary | ICD-10-CM | POA: Diagnosis not present

## 2018-04-27 NOTE — Telephone Encounter (Signed)
Unable to reach for ICM enrollment.

## 2018-04-29 ENCOUNTER — Other Ambulatory Visit (HOSPITAL_COMMUNITY): Payer: Self-pay | Admitting: Internal Medicine

## 2018-05-01 ENCOUNTER — Other Ambulatory Visit (HOSPITAL_COMMUNITY): Payer: Self-pay | Admitting: Internal Medicine

## 2018-05-03 LAB — CUP PACEART REMOTE DEVICE CHECK
Battery Remaining Longevity: 126 mo
Battery Remaining Percentage: 100 %
Brady Statistic RV Percent Paced: 1 %
HIGH POWER IMPEDANCE MEASURED VALUE: 69 Ohm
Implantable Lead Implant Date: 20180703
Implantable Lead Location: 753860
Implantable Lead Serial Number: 433180
Lead Channel Setting Pacing Amplitude: 2.4 V
Lead Channel Setting Pacing Amplitude: 2.5 V
Lead Channel Setting Pacing Pulse Width: 0.4 ms
Lead Channel Setting Sensing Sensitivity: 0.5 mV
MDC IDC LEAD IMPLANT DT: 20180703
MDC IDC LEAD LOCATION: 753859
MDC IDC MSMT LEADCHNL RA IMPEDANCE VALUE: 450 Ohm
MDC IDC MSMT LEADCHNL RV IMPEDANCE VALUE: 798 Ohm
MDC IDC PG IMPLANT DT: 20180703
MDC IDC SESS DTM: 20190703052700
MDC IDC STAT BRADY RA PERCENT PACED: 91 %
Pulse Gen Serial Number: 533886

## 2018-05-05 DIAGNOSIS — J449 Chronic obstructive pulmonary disease, unspecified: Secondary | ICD-10-CM | POA: Diagnosis not present

## 2018-05-09 ENCOUNTER — Telehealth: Payer: Self-pay | Admitting: Family Medicine

## 2018-05-09 NOTE — Telephone Encounter (Signed)
Pt needs appt for eval that he needs night & day O2 Appt made for 05/19/18

## 2018-05-19 ENCOUNTER — Other Ambulatory Visit: Payer: Self-pay | Admitting: Family Medicine

## 2018-05-19 ENCOUNTER — Ambulatory Visit (INDEPENDENT_AMBULATORY_CARE_PROVIDER_SITE_OTHER): Payer: Medicare Other | Admitting: Family Medicine

## 2018-05-19 ENCOUNTER — Encounter: Payer: Self-pay | Admitting: Family Medicine

## 2018-05-19 VITALS — BP 151/46 | HR 59 | Temp 97.3°F | Ht 70.0 in | Wt 181.4 lb

## 2018-05-19 DIAGNOSIS — I5022 Chronic systolic (congestive) heart failure: Secondary | ICD-10-CM

## 2018-05-19 DIAGNOSIS — N183 Chronic kidney disease, stage 3 unspecified: Secondary | ICD-10-CM

## 2018-05-19 DIAGNOSIS — J449 Chronic obstructive pulmonary disease, unspecified: Secondary | ICD-10-CM

## 2018-05-19 MED ORDER — FLUTICASONE FUROATE-VILANTEROL 200-25 MCG/INH IN AEPB: 1.0000 | INHALATION_SPRAY | Freq: Every day | RESPIRATORY_TRACT | 2 refills | Status: DC

## 2018-05-19 NOTE — Progress Notes (Signed)
Subjective:  Patient ID: [name] [name], male    DOB: Oct 04, 1941  Age: 77 y.o. MRN: [id number]  CC: Follow-up (pt here today for oxygen testing)   HPI.  [name] [name] presents for recheck of dyspnea.  [name] [name] is here to qualify for home oxygen. [name] [name] has some edema intermittently. Being treated for CHF with entresto. Also has COPD. Using inhalers as noted below.  Depression screen [facility] 2/9 05/19/2018 04/17/2018 01/31/2018  Decreased Interest 2 0 2  Down, Depressed, Hopeless 1 0 0  PHQ - 2 Score 3 0 2  Altered sleeping 0 - 0  Tired, decreased energy 1 - 2  Change in appetite 0 - 0  Feeling bad or failure about yourself  0 - 0  Trouble concentrating 0 - 0  Moving slowly or fidgety/restless 0 - 0  Suicidal thoughts 0 - 0  PHQ-9 Score 4 - 4  Some recent data might be hidden    History Amiere has a past medical history of AICD (automatic cardioverter/defibrillator) present (04/05/2017), Anemia, Arthritis, Asthma, Atrial fibrillation ([place]), CAD (coronary artery disease), CHF (congestive heart failure) ([place]), Cholecystitis (11/2013), CKD (chronic kidney disease) (2015), COPD (chronic obstructive pulmonary disease) ([place]), Esophageal reflux, Gallstones, Gastric antral vascular ectasia (2013), Gout, Hepatomegaly, Hiatal hernia, History of blood transfusion (~ 2012), Hyperlipidemia, Hypothyroidism, Other and unspecified coagulation defects, Personal history of colonic polyps (05/29/2010), and Unspecified essential hypertension.   [name] [name] has a past surgical history that includes Coronary angioplasty with stent (~ 2008; 07/08/2014); Coronary artery bypass graft (1998); Hernia repair; Umbilical hernia repair (1970's); left and right heart catheterization with coronary/graft angiogram (N/A, 07/09/2014); Colonoscopy (2013); Esophagogastroduodenoscopy (2013); Givens capsule study (2013); cholecystomy tube (12/28/2014 - 01/06/2015); Esophagogastroduodenoscopy (N/A, 12/12/2015); Cardiac catheterization (N/A, 07/15/2016); and  ICD IMPLANT (N/A, 04/05/2017).   His family history includes Early death in his brother; Heart attack in his brother; Heart disease in his brother and father; Hypertension in his unknown relative; Kidney disease in his mother; Leukemia in his mother; Stomach cancer in his sister and sister.[name] [name] reports that [name] [name] quit smoking about 21 years ago. His smoking use included cigarettes. [name] [name] has a 42.00 pack-year smoking history. [name] [name] has never used smokeless tobacco. [name] [name] reports that [name] [name] drinks about 1.0 standard drinks of alcohol per week. [name] [name] reports that [name] [name] does not use drugs.    ROS Review of Systems  Constitutional: Positive for fatigue. Negative for fever.  Respiratory: Positive for shortness of breath.   Cardiovascular: Positive for leg swelling. Negative for chest pain.  Musculoskeletal: Negative for arthralgias.  Skin: Negative for rash.     Objective:  BP (!) 151/46   Pulse (!) 59   Temp (!) 97.3 F (36.3 C) (Oral)   Ht 5\' 10"  (1.778 m)   Wt 181 lb 6 oz (82.3 kg)   SpO2 95%   BMI 26.02 kg/m   BP Readings from Last 3 Encounters:  05/19/18 (!) 151/46  04/17/18 (!) 152/56  04/03/18 (!) 135/50    Wt Readings from Last 3 Encounters:  05/19/18 181 lb 6 oz (82.3 kg)  04/17/18 177 lb 6 oz (80.5 kg)  04/03/18 175 lb (79.4 kg)     Physical Exam  Constitutional: [name] [name] appears well-developed and well-nourished.  HENT:  Head: Normocephalic and atraumatic.  Right Ear: Tympanic membrane and external ear normal. No decreased hearing is noted.  Left Ear: Tympanic membrane and external ear normal. No decreased hearing is noted.  Mouth/Throat: No oropharyngeal exudate or posterior oropharyngeal erythema.  Eyes: Pupils are  equal, round, and reactive to light.  Neck: Normal range of motion. Neck supple.  Cardiovascular: Normal rate and regular rhythm.  No murmur heard. Pulmonary/Chest: Effort normal and breath sounds normal. No respiratory distress.  Abdominal: Soft. Bowel sounds are normal. [name] [name]  exhibits no mass. There is no tenderness.  Musculoskeletal: [name] [name] exhibits edema (1+ at ankles).  Vitals reviewed.    02 resting, RA= 95% 02 walking, room air = 94% 02, walking, 2L 02, [place] = 97%  Assessment & Plan:   [name] [name] was seen today for follow-up.  Diagnoses and all orders for this visit:  Asthma with COPD ([place])  Chronic systolic CHF (congestive heart failure) (HCC)  CKD (chronic kidney disease), stage III ([place])  Other orders -     Discontinue: fluticasone furoate-vilanterol (BREO ELLIPTA) 200-25 MCG/INH AEPB; Inhale 1 puff into the lungs daily.       I am having Jd [employer] "[name]" maintain his ipratropium-albuterol, budesonide, multivitamins ther. w/minerals, Vitamin D3, ferrous sulfate, docusate sodium, PROAIR HFA, loratadine, nitroGLYCERIN, Hypromell-Glycerin-Naphazoline, carvedilol, sacubitril-valsartan, sucralfate, lovastatin, hydrALAZINE, levothyroxine, pantoprazole, warfarin, furosemide, carvedilol, and spironolactone.  Allergies as of 05/19/2018   No Known Allergies     Medication List        Accurate as of 05/19/18 11:59 PM. Always use your most recent med list.          budesonide 0.25 MG/2ML nebulizer solution Commonly known as:  PULMICORT Take 0.25 mg by nebulization 3 (three) times daily as needed (shortness of breath).   carvedilol 3.125 MG tablet Commonly known as:  COREG Take 1 tablet (3.125 mg total) by mouth 2 (two) times daily with a meal.   carvedilol 3.125 MG tablet Commonly known as:  COREG TAKE 1 TABLET (3.125 MG TOTAL) BY MOUTH 2 (TWO) TIMES DAILY WITH A MEAL.   CLEAR EYES FOR DRY EYES PLUS 0.8-0.25-0.012 % Soln Generic drug:  Hypromell-Glycerin-Naphazoline Place 1-2 drops into both eyes 3 (three) times daily as needed (for dry eyes.).   docusate sodium 100 MG capsule Commonly known as:  COLACE Take 100 mg by mouth daily.   ferrous sulfate 325 (65 FE) MG tablet Take 325 mg by mouth 2 (two) times daily.   fluticasone  furoate-vilanterol 200-25 MCG/INH Aepb Commonly known as:  BREO ELLIPTA Inhale 1 puff into the lungs daily.   furosemide 40 MG tablet Commonly known as:  LASIX TAKE 1 TABLET BY MOUTH  DAILY   hydrALAZINE 50 MG tablet Commonly known as:  APRESOLINE Take 1 tablet (50 mg total) by mouth 3 (three) times daily.   ipratropium-albuterol 0.5-2.5 (3) MG/3ML Soln Commonly known as:  DUONEB Take 3 mLs by nebulization 3 (three) times daily as needed (for shortness of breath/wheezing.). For shortness of breath.   levothyroxine 112 MCG tablet Commonly known as:  SYNTHROID, LEVOTHROID Take 1 tablet (112 mcg total) by mouth daily before breakfast.   loratadine 10 MG tablet Commonly known as:  CLARITIN Take 10 mg by mouth daily as needed (for allergies.).   lovastatin 40 MG tablet Commonly known as:  MEVACOR TAKE 2 TABLETS BY MOUTH AT  BEDTIME   multivitamins ther. w/minerals Tabs tablet Take 1 tablet by mouth daily.   nitroGLYCERIN 0.4 MG SL tablet Commonly known as:  NITROSTAT Place 1 tablet (0.4 mg total) under the tongue every 5 (five) minutes as needed for chest pain.   pantoprazole 40 MG tablet Commonly known as:  PROTONIX Take 1 tablet (40 mg total) by mouth 2 (two) times daily.  PROAIR HFA 108 (90 Base) MCG/ACT inhaler Generic drug:  albuterol Use 2 puffs every 6 hours  as needed for shortness of  breath   sacubitril-valsartan 97-103 MG Commonly known as:  ENTRESTO Take 1 tablet by mouth 2 (two) times daily.   spironolactone 25 MG tablet Commonly known as:  ALDACTONE TAKE 0.5 TABLETS (12.5 MG TOTAL) BY MOUTH DAILY.   sucralfate 1 g tablet Commonly known as:  CARAFATE Take 1 tablet (1 g total) by mouth 2 (two) times daily. With breakfast and supper.   Vitamin D3 5000 units Caps Take 5,000 Units by mouth daily.   warfarin 5 MG tablet Commonly known as:  COUMADIN Take as directed by the anticoagulation clinic. If you are unsure how to take this medication, talk to  your nurse or doctor. Original instructions:  TAKE 1/2 TABLET BY MOUTH  (2.5MG ) ON MONDAY,  WEDNESDAY, AND FRIDAY AND 1 TABLET (5MG ) ALL OTHER DAYS      Pt. Doesn't qualify for 02, but states [name] [name] is dyspneic at night. Will order overnight pulse ox.  Follow-up: Return in about 6 weeks (around 06/30/2018).  Claretta Fraise, M.D.

## 2018-05-19 NOTE — Telephone Encounter (Signed)
Prescription sent to mail order, patient aware

## 2018-05-20 ENCOUNTER — Encounter: Payer: Self-pay | Admitting: Family Medicine

## 2018-05-22 ENCOUNTER — Other Ambulatory Visit: Payer: Self-pay | Admitting: Family Medicine

## 2018-05-26 ENCOUNTER — Other Ambulatory Visit: Payer: Self-pay | Admitting: Family Medicine

## 2018-05-26 MED ORDER — WARFARIN SODIUM 5 MG PO TABS: ORAL_TABLET | ORAL | 0 refills | Status: DC

## 2018-05-28 ENCOUNTER — Other Ambulatory Visit (HOSPITAL_COMMUNITY): Payer: Self-pay | Admitting: Internal Medicine

## 2018-05-28 DIAGNOSIS — J449 Chronic obstructive pulmonary disease, unspecified: Secondary | ICD-10-CM | POA: Diagnosis not present

## 2018-06-13 ENCOUNTER — Other Ambulatory Visit (HOSPITAL_COMMUNITY): Payer: Self-pay | Admitting: Internal Medicine

## 2018-06-26 DIAGNOSIS — J449 Chronic obstructive pulmonary disease, unspecified: Secondary | ICD-10-CM | POA: Diagnosis not present

## 2018-06-27 ENCOUNTER — Other Ambulatory Visit (HOSPITAL_COMMUNITY): Payer: Self-pay | Admitting: Cardiology

## 2018-06-28 DIAGNOSIS — J449 Chronic obstructive pulmonary disease, unspecified: Secondary | ICD-10-CM | POA: Diagnosis not present

## 2018-06-30 ENCOUNTER — Other Ambulatory Visit: Payer: Self-pay | Admitting: Family Medicine

## 2018-07-05 ENCOUNTER — Ambulatory Visit (INDEPENDENT_AMBULATORY_CARE_PROVIDER_SITE_OTHER): Payer: Medicare Other | Admitting: *Deleted

## 2018-07-05 DIAGNOSIS — I5022 Chronic systolic (congestive) heart failure: Secondary | ICD-10-CM

## 2018-07-05 DIAGNOSIS — I255 Ischemic cardiomyopathy: Secondary | ICD-10-CM

## 2018-07-05 NOTE — Progress Notes (Signed)
Remote ICD transmission.   

## 2018-07-06 ENCOUNTER — Telehealth: Payer: Self-pay | Admitting: Internal Medicine

## 2018-07-06 NOTE — Telephone Encounter (Signed)
Error. No notes needed

## 2018-07-11 ENCOUNTER — Other Ambulatory Visit (HOSPITAL_COMMUNITY): Payer: Self-pay | Admitting: Internal Medicine

## 2018-07-12 ENCOUNTER — Other Ambulatory Visit (HOSPITAL_COMMUNITY): Payer: Self-pay | Admitting: Cardiology

## 2018-07-18 ENCOUNTER — Ambulatory Visit (INDEPENDENT_AMBULATORY_CARE_PROVIDER_SITE_OTHER): Payer: Medicare Other

## 2018-07-18 ENCOUNTER — Ambulatory Visit (INDEPENDENT_AMBULATORY_CARE_PROVIDER_SITE_OTHER): Payer: Medicare Other | Admitting: Family Medicine

## 2018-07-18 ENCOUNTER — Encounter: Payer: Self-pay | Admitting: Family Medicine

## 2018-07-18 VITALS — BP 137/57 | HR 60 | Temp 96.9°F | Ht 70.0 in | Wt 177.8 lb

## 2018-07-18 DIAGNOSIS — N183 Chronic kidney disease, stage 3 unspecified: Secondary | ICD-10-CM

## 2018-07-18 DIAGNOSIS — M542 Cervicalgia: Secondary | ICD-10-CM

## 2018-07-18 DIAGNOSIS — E782 Mixed hyperlipidemia: Secondary | ICD-10-CM

## 2018-07-18 DIAGNOSIS — M47814 Spondylosis without myelopathy or radiculopathy, thoracic region: Secondary | ICD-10-CM | POA: Diagnosis not present

## 2018-07-18 DIAGNOSIS — Z23 Encounter for immunization: Secondary | ICD-10-CM | POA: Diagnosis not present

## 2018-07-18 DIAGNOSIS — D500 Iron deficiency anemia secondary to blood loss (chronic): Secondary | ICD-10-CM | POA: Diagnosis not present

## 2018-07-18 DIAGNOSIS — I482 Chronic atrial fibrillation, unspecified: Secondary | ICD-10-CM

## 2018-07-18 DIAGNOSIS — M5134 Other intervertebral disc degeneration, thoracic region: Secondary | ICD-10-CM | POA: Diagnosis not present

## 2018-07-18 DIAGNOSIS — E038 Other specified hypothyroidism: Secondary | ICD-10-CM

## 2018-07-18 DIAGNOSIS — I5022 Chronic systolic (congestive) heart failure: Secondary | ICD-10-CM | POA: Diagnosis not present

## 2018-07-18 DIAGNOSIS — I10 Essential (primary) hypertension: Secondary | ICD-10-CM

## 2018-07-18 DIAGNOSIS — Z7901 Long term (current) use of anticoagulants: Secondary | ICD-10-CM

## 2018-07-18 LAB — COAGUCHEK XS/INR WAIVED
INR: 2.8 — AB (ref 0.9–1.1)
Prothrombin Time: 34 s

## 2018-07-18 LAB — PROTIME-INR: INR: 2.8 — AB (ref 0.9–1.1)

## 2018-07-18 MED ORDER — ALBUTEROL SULFATE HFA 108 (90 BASE) MCG/ACT IN AERS: INHALATION_SPRAY | RESPIRATORY_TRACT | 3 refills | Status: DC

## 2018-07-18 MED ORDER — WARFARIN SODIUM 5 MG PO TABS: ORAL_TABLET | ORAL | 0 refills | Status: DC

## 2018-07-18 MED ORDER — LEVOTHYROXINE SODIUM 112 MCG PO TABS: 112.0000 ug | ORAL_TABLET | Freq: Every day | ORAL | 1 refills | Status: DC

## 2018-07-18 MED ORDER — PANTOPRAZOLE SODIUM 40 MG PO TBEC: 40.0000 mg | DELAYED_RELEASE_TABLET | Freq: Two times a day (BID) | ORAL | 1 refills | Status: DC

## 2018-07-18 MED ORDER — FUROSEMIDE 40 MG PO TABS: 40.0000 mg | ORAL_TABLET | Freq: Every day | ORAL | 1 refills | Status: DC

## 2018-07-18 MED ORDER — SUCRALFATE 1 G PO TABS: 1.0000 g | ORAL_TABLET | Freq: Three times a day (TID) | ORAL | 1 refills | Status: DC

## 2018-07-18 NOTE — Progress Notes (Signed)
Subjective:  Patient ID: [name] [name], male    DOB: Mar 04, 1941  Age: 77 y.o. MRN: [id number]  CC: Anticoagulation   HPI [name] presents for Patient in for follow-up of atrial fibrillation. Patient denies any recent bouts of chest pain or palpitations. Additionally, patient is taking anticoagulants. Patient denies any recent excessive bleeding episodes including epistaxis, bleeding from the gums, genitalia, rectal bleeding or hematuria. Additionally there has been no excessive bruising.   Patient presents for follow-up on  thyroid. The patient has a history of hypothyroidism for many years. It has been stable recently. Pt. denies any change in  voice, loss of hair, heat or cold intolerance. Energy level has been adequate to good. Patient denies constipation and diarrhea. No myxedema. Medication is as noted below. Verified that pt is taking it daily on an empty stomach. Well tolerated.   Follow-up of hypertension. Patient has no history of headache chest pain or shortness of breath or recent cough. Patient also denies symptoms of TIA such as numbness weakness lateralizing. Patient checks  blood pressure at home and has not had any elevated readings recently. Patient denies side effects from his medication. States taking it regularly. Patient in for follow-up of elevated cholesterol. Doing well without complaints on current medication. Denies side effects of statin including myalgia and arthralgia and nausea. Also in today for liver function testing. Currently no chest pain, shortness of breath or other cardiovascular related symptoms noted.    Depression screen [facility] 2/9 07/18/2018 05/19/2018 04/17/2018  Decreased Interest 0 2 0  Down, Depressed, Hopeless 0 1 0  PHQ - 2 Score 0 3 0  Altered sleeping 0 0 -  Tired, decreased energy 0 1 -  Change in appetite 0 0 -  Feeling bad or failure about yourself  0 0 -  Trouble concentrating 0 0 -  Moving slowly or fidgety/restless 0 0 -    Suicidal thoughts 0 0 -  PHQ-9 Score 0 4 -  Some recent data might be hidden    History [place] has a past medical history of AICD (automatic cardioverter/defibrillator) present (04/05/2017), Anemia, Arthritis, Asthma, Atrial fibrillation ([place]), CAD (coronary artery disease), CHF (congestive heart failure) ([place]), Cholecystitis (11/2013), CKD (chronic kidney disease) (2015), COPD (chronic obstructive pulmonary disease) ([place]), Esophageal reflux, Gallstones, Gastric antral vascular ectasia (2013), Gout, Hepatomegaly, Hiatal hernia, History of blood transfusion (~ 2012), Hyperlipidemia, Hypothyroidism, Other and unspecified coagulation defects, Personal history of colonic polyps (05/29/2010), and Unspecified essential hypertension.   He has a past surgical history that includes Coronary angioplasty with stent (~ 2008; 07/08/2014); Coronary artery bypass graft (1998); Hernia repair; Umbilical hernia repair (1970's); left and right heart catheterization with coronary/graft angiogram (N/A, 07/09/2014); Colonoscopy (2013); Esophagogastroduodenoscopy (2013); Givens capsule study (2013); cholecystomy tube (12/28/2014 - 01/06/2015); Esophagogastroduodenoscopy (N/A, 12/12/2015); Cardiac catheterization (N/A, 07/15/2016); and ICD IMPLANT (N/A, 04/05/2017).   His family history includes Early death in his brother; Heart attack in his brother; Heart disease in his brother and father; Hypertension in his unknown relative; Kidney disease in his mother; Leukemia in his mother; Stomach cancer in his sister and sister.He reports that he quit smoking about 21 years ago. His smoking use included cigarettes. He has a 42.00 pack-year smoking history. He has never used smokeless tobacco. He reports that he drinks about 1.0 standard drinks of alcohol per week. He reports that he does not use drugs.    ROS Review of Systems  Constitutional: Negative.   HENT: Negative.   Eyes: Negative for  visual disturbance.  Respiratory: Negative  for cough and shortness of breath.   Cardiovascular: Negative for chest pain and leg swelling.  Gastrointestinal: Negative for abdominal pain, diarrhea, nausea and vomiting.  Genitourinary: Negative for difficulty urinating.  Musculoskeletal: Negative for arthralgias and myalgias.  Skin: Negative for rash.  Neurological: Negative for headaches.  Psychiatric/Behavioral: Negative for sleep disturbance.    Objective:  BP (!) 137/57   Pulse 60   Temp (!) 96.9 F (36.1 C) (Oral)   Ht '5\' 10"'$  (1.778 m)   Wt 177 lb 12.8 oz (80.6 kg)   BMI 25.51 kg/m    BP Readings from Last 3 Encounters:  07/18/18 (!) 137/57  05/19/18 (!) 151/46  04/17/18 (!) 152/56    Wt Readings from Last 3 Encounters:  07/18/18 177 lb 12.8 oz (80.6 kg)  05/19/18 181 lb 6 oz (82.3 kg)  04/17/18 177 lb 6 oz (80.5 kg)     Physical Exam  Constitutional: He is oriented to person, place, and time. He appears well-developed and well-nourished. No distress.  HENT:  Head: Normocephalic and atraumatic.  Right Ear: External ear normal.  Left Ear: External ear normal.  Nose: Nose normal.  Mouth/Throat: Oropharynx is clear and moist.  Eyes: Pupils are equal, round, and reactive to light. Conjunctivae and EOM are normal.  Neck: Normal range of motion. Neck supple.  Cardiovascular: Normal rate, regular rhythm and normal heart sounds.  No murmur heard. Pulmonary/Chest: Effort normal and breath sounds normal. No respiratory distress. He has no wheezes. He has no rales.  Abdominal: Soft. There is no tenderness.  Musculoskeletal: Normal range of motion.  Neurological: He is alert and oriented to person, place, and time. He has normal reflexes.  Skin: Skin is warm and dry.  Psychiatric: He has a normal mood and affect. His behavior is normal. Judgment and thought content normal.      Assessment & Plan:   Lizzie was seen today for anticoagulation.  Diagnoses and all orders for this visit:  Chronic systolic CHF  (congestive heart failure) (HCC) -     CBC with Differential/Platelet -     CMP14+EGFR -     Lipid panel -     Thyroid Panel With TSH -     Testosterone,Free and Total  CKD (chronic kidney disease), stage III (HCC) -     CBC with Differential/Platelet -     CMP14+EGFR -     Lipid panel -     Thyroid Panel With TSH -     Testosterone,Free and Total  Atrial fibrillation, chronic -     CBC with Differential/Platelet -     CMP14+EGFR -     Lipid panel -     Thyroid Panel With TSH -     Testosterone,Free and Total -     CoaguChek XS/INR Waived  Mixed hyperlipidemia -     CBC with Differential/Platelet -     CMP14+EGFR -     Lipid panel -     Thyroid Panel With TSH -     Testosterone,Free and Total  Iron deficiency anemia due to chronic blood loss -     CBC with Differential/Platelet -     CMP14+EGFR -     Lipid panel -     Thyroid Panel With TSH -     Testosterone,Free and Total  Essential hypertension -     CBC with Differential/Platelet -     CMP14+EGFR -     Lipid panel -  Thyroid Panel With TSH -     Testosterone,Free and Total  Other specified hypothyroidism -     CBC with Differential/Platelet -     CMP14+EGFR -     Lipid panel -     Thyroid Panel With TSH -     Testosterone,Free and Total  Chronic anticoagulation -     CBC with Differential/Platelet -     CMP14+EGFR -     Lipid panel -     Thyroid Panel With TSH -     Testosterone,Free and Total  Neck pain -     DG Cervical Spine Complete; Future  Encounter for immunization -     Flu vaccine HIGH DOSE PF  Other orders -     warfarin (COUMADIN) 5 MG tablet; TAKE 1 TABLET BY MOUTH  ('5MG'$ ) ON MONDAY,  WEDNESDAY, AND FRIDAY AND 1/2 TABLET (2.'5MG'$ ) ALL OTHER DAYS -     sucralfate (CARAFATE) 1 g tablet; Take 1 tablet (1 g total) by mouth 4 (four) times daily -  with meals and at bedtime. With breakfast and supper. -     albuterol (PROAIR HFA) 108 (90 Base) MCG/ACT inhaler; Use 2 puffs every 6 hours  as  needed for shortness of  breath -     pantoprazole (PROTONIX) 40 MG tablet; Take 1 tablet (40 mg total) by mouth 2 (two) times daily. -     levothyroxine (SYNTHROID, LEVOTHROID) 112 MCG tablet; Take 1 tablet (112 mcg total) by mouth daily before breakfast. -     furosemide (LASIX) 40 MG tablet; Take 1 tablet (40 mg total) by mouth daily.       I have changed Nino R. Prothero "[name]"'s PROAIR HFA to albuterol. I have also changed his warfarin, sucralfate, pantoprazole, and furosemide. I am also having him maintain his ipratropium-albuterol, budesonide, multivitamins ther. w/minerals, Vitamin D3, ferrous sulfate, docusate sodium, loratadine, nitroGLYCERIN, Hypromell-Glycerin-Naphazoline, carvedilol, sacubitril-valsartan, lovastatin, hydrALAZINE, fluticasone furoate-vilanterol, spironolactone, carvedilol, ENTRESTO, and levothyroxine.  Allergies as of 07/18/2018   No Known Allergies     Medication List        Accurate as of 07/18/18 11:59 PM. Always use your most recent med list.          albuterol 108 (90 Base) MCG/ACT inhaler Commonly known as:  PROVENTIL HFA;VENTOLIN HFA Use 2 puffs every 6 hours  as needed for shortness of  breath   budesonide 0.25 MG/2ML nebulizer solution Commonly known as:  PULMICORT Take 0.25 mg by nebulization 3 (three) times daily as needed (shortness of breath).   carvedilol 3.125 MG tablet Commonly known as:  COREG Take 1 tablet (3.125 mg total) by mouth 2 (two) times daily with a meal.   carvedilol 3.125 MG tablet Commonly known as:  COREG TAKE 1 TABLET (3.125 MG TOTAL) BY MOUTH 2 (TWO) TIMES DAILY WITH A MEAL.   CLEAR EYES FOR DRY EYES PLUS 0.8-0.25-0.012 % Soln Generic drug:  Hypromell-Glycerin-Naphazoline Place 1-2 drops into both eyes 3 (three) times daily as needed (for dry eyes.).   docusate sodium 100 MG capsule Commonly known as:  COLACE Take 100 mg by mouth daily.   ferrous sulfate 325 (65 FE) MG tablet Take 325 mg by mouth 2 (two)  times daily.   fluticasone furoate-vilanterol 200-25 MCG/INH Aepb Commonly known as:  BREO ELLIPTA Inhale 1 puff into the lungs daily.   furosemide 40 MG tablet Commonly known as:  LASIX Take 1 tablet (40 mg total) by mouth daily.   hydrALAZINE 50 MG  tablet Commonly known as:  APRESOLINE Take 1 tablet (50 mg total) by mouth 3 (three) times daily.   ipratropium-albuterol 0.5-2.5 (3) MG/3ML Soln Commonly known as:  DUONEB Take 3 mLs by nebulization 3 (three) times daily as needed (for shortness of breath/wheezing.). For shortness of breath.   levothyroxine 112 MCG tablet Commonly known as:  SYNTHROID, LEVOTHROID Take 1 tablet (112 mcg total) by mouth daily before breakfast.   loratadine 10 MG tablet Commonly known as:  CLARITIN Take 10 mg by mouth daily as needed (for allergies.).   lovastatin 40 MG tablet Commonly known as:  MEVACOR TAKE 2 TABLETS BY MOUTH AT  BEDTIME   multivitamins ther. w/minerals Tabs tablet Take 1 tablet by mouth daily.   nitroGLYCERIN 0.4 MG SL tablet Commonly known as:  NITROSTAT Place 1 tablet (0.4 mg total) under the tongue every 5 (five) minutes as needed for chest pain.   pantoprazole 40 MG tablet Commonly known as:  PROTONIX Take 1 tablet (40 mg total) by mouth 2 (two) times daily.   sacubitril-valsartan 97-103 MG Commonly known as:  ENTRESTO Take 1 tablet by mouth 2 (two) times daily.   ENTRESTO 97-103 MG Generic drug:  sacubitril-valsartan TAKE 1 TABLET BY MOUTH 2 TIMES DAILY. PLEASE CALL FOR OFFICE VISIT   spironolactone 25 MG tablet Commonly known as:  ALDACTONE TAKE 1/2 TABLET BY MOUTH EVERY DAY   sucralfate 1 g tablet Commonly known as:  CARAFATE Take 1 tablet (1 g total) by mouth 4 (four) times daily -  with meals and at bedtime. With breakfast and supper.   Vitamin D3 5000 units Caps Take 5,000 Units by mouth daily.   warfarin 5 MG tablet Commonly known as:  COUMADIN Take as directed by the anticoagulation clinic. If  you are unsure how to take this medication, talk to your nurse or doctor. Original instructions:  TAKE 1 TABLET BY MOUTH  ('5MG'$ ) ON MONDAY,  WEDNESDAY, AND FRIDAY AND 1/2 TABLET (2.'5MG'$ ) ALL OTHER DAYS        Follow-up: Return in about 3 months (around 10/18/2018).  Claretta Fraise, M.D.

## 2018-07-19 ENCOUNTER — Ambulatory Visit: Payer: Medicare Other | Admitting: Family Medicine

## 2018-07-19 LAB — THYROID PANEL WITH TSH
Free Thyroxine Index: 2.9 (ref 1.2–4.9)
T3 Uptake Ratio: 30 % (ref 24–39)
T4, Total: 9.8 ug/dL (ref 4.5–12.0)
TSH: 1.74 u[IU]/mL (ref 0.450–4.500)

## 2018-07-19 LAB — CBC WITH DIFFERENTIAL/PLATELET
BASOS: 1 %
Basophils Absolute: 0.1 10*3/uL (ref 0.0–0.2)
EOS (ABSOLUTE): 0.3 10*3/uL (ref 0.0–0.4)
EOS: 3 %
HEMATOCRIT: 34.7 % — AB (ref 37.5–51.0)
Hemoglobin: 11.3 g/dL — ABNORMAL LOW (ref 13.0–17.7)
IMMATURE GRANS (ABS): 0 10*3/uL (ref 0.0–0.1)
IMMATURE GRANULOCYTES: 0 %
LYMPHS: 13 %
Lymphocytes Absolute: 1.3 10*3/uL (ref 0.7–3.1)
MCH: 32.4 pg (ref 26.6–33.0)
MCHC: 32.6 g/dL (ref 31.5–35.7)
MCV: 99 fL — AB (ref 79–97)
MONOCYTES: 10 %
Monocytes Absolute: 1 10*3/uL — ABNORMAL HIGH (ref 0.1–0.9)
NEUTROS PCT: 73 %
Neutrophils Absolute: 7.1 10*3/uL — ABNORMAL HIGH (ref 1.4–7.0)
PLATELETS: 279 10*3/uL (ref 150–450)
RBC: 3.49 x10E6/uL — ABNORMAL LOW (ref 4.14–5.80)
RDW: 12 % — ABNORMAL LOW (ref 12.3–15.4)
WBC: 9.8 10*3/uL (ref 3.4–10.8)

## 2018-07-19 LAB — CMP14+EGFR
A/G RATIO: 2.1 (ref 1.2–2.2)
ALT: 13 IU/L (ref 0–44)
AST: 17 IU/L (ref 0–40)
Albumin: 4 g/dL (ref 3.5–4.8)
Alkaline Phosphatase: 48 IU/L (ref 39–117)
BUN/Creatinine Ratio: 20 (ref 10–24)
BUN: 30 mg/dL — AB (ref 8–27)
Bilirubin Total: 0.2 mg/dL (ref 0.0–1.2)
CALCIUM: 9.1 mg/dL (ref 8.6–10.2)
CO2: 23 mmol/L (ref 20–29)
Chloride: 101 mmol/L (ref 96–106)
Creatinine, Ser: 1.5 mg/dL — ABNORMAL HIGH (ref 0.76–1.27)
GFR calc Af Amer: 50 mL/min/{1.73_m2} — ABNORMAL LOW (ref >59)
GFR, EST NON AFRICAN AMERICAN: 44 mL/min/{1.73_m2} — AB (ref >59)
Globulin, Total: 1.9 g/dL (ref 1.5–4.5)
Glucose: 94 mg/dL (ref 65–99)
POTASSIUM: 4.3 mmol/L (ref 3.5–5.2)
Sodium: 141 mmol/L (ref 134–144)
Total Protein: 5.9 g/dL — ABNORMAL LOW (ref 6.0–8.5)

## 2018-07-19 LAB — LIPID PANEL
CHOL/HDL RATIO: 3 ratio (ref 0.0–5.0)
Cholesterol, Total: 109 mg/dL (ref 100–199)
HDL: 36 mg/dL — AB (ref >39)
LDL Calculated: 49 mg/dL (ref 0–99)
Triglycerides: 121 mg/dL (ref 0–149)
VLDL CHOLESTEROL CAL: 24 mg/dL (ref 5–40)

## 2018-07-19 LAB — TESTOSTERONE,FREE AND TOTAL
Testosterone, Free: 3.2 pg/mL — ABNORMAL LOW (ref 6.6–18.1)
Testosterone: 325 ng/dL (ref 264–916)

## 2018-07-20 ENCOUNTER — Other Ambulatory Visit: Payer: Self-pay | Admitting: *Deleted

## 2018-07-20 ENCOUNTER — Other Ambulatory Visit: Payer: Medicare Other

## 2018-07-20 DIAGNOSIS — Z1211 Encounter for screening for malignant neoplasm of colon: Secondary | ICD-10-CM

## 2018-07-21 ENCOUNTER — Other Ambulatory Visit (HOSPITAL_COMMUNITY): Payer: Self-pay | Admitting: Cardiology

## 2018-07-25 ENCOUNTER — Encounter: Payer: Self-pay | Admitting: Family Medicine

## 2018-07-26 ENCOUNTER — Other Ambulatory Visit: Payer: Self-pay | Admitting: Family Medicine

## 2018-07-26 ENCOUNTER — Ambulatory Visit (INDEPENDENT_AMBULATORY_CARE_PROVIDER_SITE_OTHER): Payer: Medicare Other | Admitting: Pharmacist Clinician (PhC)/ Clinical Pharmacy Specialist

## 2018-07-26 ENCOUNTER — Encounter: Payer: Medicare Other | Admitting: Family Medicine

## 2018-07-26 DIAGNOSIS — D500 Iron deficiency anemia secondary to blood loss (chronic): Secondary | ICD-10-CM

## 2018-07-26 DIAGNOSIS — K921 Melena: Secondary | ICD-10-CM

## 2018-07-26 DIAGNOSIS — I482 Chronic atrial fibrillation, unspecified: Secondary | ICD-10-CM

## 2018-07-26 LAB — COAGUCHEK XS/INR WAIVED
INR: 2.3 — ABNORMAL HIGH (ref 0.9–1.1)
Prothrombin Time: 27.7 s

## 2018-07-26 LAB — FECAL OCCULT BLOOD, IMMUNOCHEMICAL: Fecal Occult Bld: POSITIVE — AB

## 2018-07-26 NOTE — Patient Instructions (Signed)
Description   Continue taking warfarin the same way.   Goal is 1.8-2.2  INR 2.3  Today ( a little thin today)  Eat 1-2 servings a week of a high vitamin K vegetable

## 2018-07-27 ENCOUNTER — Telehealth: Payer: Self-pay | Admitting: Family Medicine

## 2018-07-28 DIAGNOSIS — J449 Chronic obstructive pulmonary disease, unspecified: Secondary | ICD-10-CM | POA: Diagnosis not present

## 2018-07-28 LAB — CUP PACEART REMOTE DEVICE CHECK
Battery Remaining Longevity: 125 mo
Brady Statistic RA Percent Paced: 89 %
Date Time Interrogation Session: 20191025115157
HighPow Impedance: 76 Ohm
Implantable Lead Implant Date: 20180703
Implantable Lead Implant Date: 20180703
Implantable Lead Location: 753859
Implantable Lead Model: 293
Implantable Lead Serial Number: 433180
Lead Channel Impedance Value: 810 Ohm
Lead Channel Setting Pacing Pulse Width: 0.4 ms
MDC IDC LEAD LOCATION: 753860
MDC IDC MSMT LEADCHNL RA IMPEDANCE VALUE: 447 Ohm
MDC IDC PG IMPLANT DT: 20180703
MDC IDC PG SERIAL: 533886
MDC IDC SET LEADCHNL RA PACING AMPLITUDE: 2.4 V
MDC IDC SET LEADCHNL RV PACING AMPLITUDE: 2.5 V
MDC IDC SET LEADCHNL RV SENSING SENSITIVITY: 0.5 mV
MDC IDC STAT BRADY RV PERCENT PACED: 1 %

## 2018-07-28 NOTE — Telephone Encounter (Signed)
Tried to call [facility]; no answer; Will try again

## 2018-07-28 NOTE — Telephone Encounter (Signed)
LMOM with new appointment date/time 08/08/18 at 1:30

## 2018-07-30 ENCOUNTER — Other Ambulatory Visit (HOSPITAL_COMMUNITY): Payer: Self-pay | Admitting: Internal Medicine

## 2018-07-30 ENCOUNTER — Other Ambulatory Visit (HOSPITAL_COMMUNITY): Payer: Self-pay | Admitting: Student

## 2018-08-02 DIAGNOSIS — J449 Chronic obstructive pulmonary disease, unspecified: Secondary | ICD-10-CM | POA: Diagnosis not present

## 2018-08-07 ENCOUNTER — Other Ambulatory Visit: Payer: Self-pay

## 2018-08-07 DIAGNOSIS — I6523 Occlusion and stenosis of bilateral carotid arteries: Secondary | ICD-10-CM

## 2018-08-08 ENCOUNTER — Encounter: Payer: Self-pay | Admitting: Gastroenterology

## 2018-08-08 ENCOUNTER — Ambulatory Visit: Payer: Medicare Other | Admitting: Gastroenterology

## 2018-08-08 VITALS — BP 130/50 | HR 60 | Ht 67.75 in | Wt 178.1 lb

## 2018-08-08 DIAGNOSIS — R195 Other fecal abnormalities: Secondary | ICD-10-CM

## 2018-08-08 DIAGNOSIS — D649 Anemia, unspecified: Secondary | ICD-10-CM | POA: Diagnosis not present

## 2018-08-08 NOTE — Patient Instructions (Addendum)
If you are age 79 or older, your body mass index should be between 23-30. Your Body mass index is 27.28 kg/m. If this is out of the aforementioned range listed, please consider follow up with your Primary Care Provider.  If you are age 40 or younger, your body mass index should be between 19-25. Your Body mass index is 27.28 kg/m. If this is out of the aformentioned range listed, please consider follow up with your Primary Care Provider.   See Cardiologist, Dr. Lovena Le or PA for weakness, dizziness.  Appointment on August 17, 2018 at 9:40 am with Chanetta Marshall, PA.  Increase Iron to twice daily.  Follow up with me on August 22, 2018 at 1:30 pm.  Thank you for choosing me and [facility].  Alonza Bogus, PA-C

## 2018-08-08 NOTE — Progress Notes (Addendum)
08/08/2018 [name] [name] [id number] Feb 10, 1941   HISTORY OF PRESENT ILLNESS:  This is a 77 year old male who is a patient of Dr. Vena Rua  Has a history of GAVE and proximal small bowel AVMs when evaluated in the past for iron deficiency anemia (2017 was last EGD and capsule).  GAVE was ablated.  He is here again with recurrent anemia, heme positive stools.  Hemoglobin is 11.3 g, only down about a gram from his baseline.  Denies overt bleeding.  Stools are dark, but takes iron supplements once daily.  Is on coumadin and has extensive cardiac history with AICD in place and EF of 30-35%.  Last colonoscopy was 6 years ago for evaluation of the same issues and was normal at that time.  He is complaining of extreme weakness and fatigue.  Also complained of weakness and dizziness with taking deep breaths today.   Past Medical History:  Diagnosis Date  . 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 Anemia   . Arthritis    "hips; back" (07/09/2014)  . Asthma   . Atrial fibrillation ([place])   . CAD (coronary artery disease)   . CHF (congestive heart failure) ([place])   . Cholecystitis 11/2013  . 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   . COPD (chronic obstructive pulmonary disease) ([facility])    on home oxygen, 2 liters at night10/2015  . Esophageal reflux   . Gallstones   . Gastric antral vascular ectasia 2013  . Gout   . Hepatomegaly   . Hiatal hernia   . History of blood transfusion ~ 2012   "blood count dropped; had to get 3 units"  . Hyperlipidemia   . Hypothyroidism   . Other and unspecified coagulation defects   . Personal history of colonic polyps 05/29/2010   TUBULAR ADENOMA  . Unspecified essential hypertension    Past Surgical History:  Procedure Laterality Date  . CARDIAC CATHETERIZATION N/A 07/15/2016   Procedure: Right/Left Heart Cath and Coronary/Graft Angiography;  Surgeon: Belva Crome, MD;  Location: [facility];  Service:  Cardiovascular;  Laterality: N/A;  . cholecystomy tube  12/28/2014 - 01/06/2015   tube clogged with debris, removed and IR unable to place new tube.   . COLONOSCOPY  2013   Dr. Sharlett Iles: no polyps or evidence of active bleeding  . CORONARY ANGIOPLASTY WITH STENT PLACEMENT  ~ 2008; 07/08/2014   "1 + 3"  . CORONARY ARTERY BYPASS GRAFT  1998   CABG X4  . ESOPHAGOGASTRODUODENOSCOPY  2013   Dr. 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 ESOPHAGOGASTRODUODENOSCOPY N/A 12/12/2015   Procedure: ESOPHAGOGASTRODUODENOSCOPY (EGD);  Surgeon: Gatha Mayer, MD;  Location: [facility];  Service: Endoscopy;  Laterality: N/A;  . GIVENS CAPSULE STUDY  2013   Dr. Sharlett Iles: AVM at 42 and blood at 30 minutes beyond first duodenal image but not actual lesion seen. If persistent IDA, bleeding, recommend enteroscopy with ablation   . HERNIA REPAIR    . ICD IMPLANT N/A 04/05/2017   Procedure: ICD Implant;  Surgeon: Evans Lance, MD;  Location: [facility];  Service: Cardiovascular;  Laterality: N/A;  . LEFT AND RIGHT HEART CATHETERIZATION WITH CORONARY/GRAFT ANGIOGRAM N/A 07/09/2014   Right and left heart cath, bare metal stent to SVG to RCA. Sinclair Grooms, MD;   . UMBILICAL HERNIA REPAIR  [phone]    reports that he quit smoking about 21 years ago. His  smoking use included cigarettes. He has a 42.00 pack-year smoking history. He has never used smokeless tobacco. He reports that he drinks about 1.0 standard drinks of alcohol per week. He reports that he does not use drugs. family history includes Early death in his brother; Heart attack in his brother; Heart disease in his brother and father; Hypertension in his unknown relative; Kidney disease in his mother; Leukemia in his mother; Stomach cancer in his sister and sister. No Known Allergies    Outpatient Encounter Medications as of 08/08/2018  Medication Sig  . albuterol (PROAIR HFA) 108 (90 Base) MCG/ACT  inhaler Use 2 puffs every 6 hours  as needed for shortness of  breath  . budesonide (PULMICORT) 0.25 MG/2ML nebulizer solution Take 0.25 mg by nebulization 3 (three) times daily as needed (shortness of breath).   . carvedilol (COREG) 3.125 MG tablet TAKE 1 TABLET (3.125 MG TOTAL) BY MOUTH 2 (TWO) TIMES DAILY WITH A MEAL.  Marland Kitchen Cholecalciferol (VITAMIN D3) 5000 UNITS CAPS Take 5,000 Units by mouth daily.   Marland Kitchen docusate sodium (COLACE) 100 MG capsule Take 100 mg by mouth daily.   Marland Kitchen ENTRESTO 97-103 MG TAKE 1 TABLET BY MOUTH 2 TIMES DAILY. PLEASE CALL FOR OFFICE VISIT  . ferrous sulfate 325 (65 FE) MG tablet Take 325 mg by mouth 2 (two) times daily.  . fluticasone furoate-vilanterol (BREO ELLIPTA) 200-25 MCG/INH AEPB Inhale 1 puff into the lungs daily.  . furosemide (LASIX) 40 MG tablet Take 1 tablet (40 mg total) by mouth daily.  . hydrALAZINE (APRESOLINE) 50 MG tablet TAKE 1 TABLET BY MOUTH THREE TIMES A DAY  . Hypromell-Glycerin-Naphazoline (CLEAR EYES FOR DRY EYES PLUS) 0.8-0.25-0.012 % SOLN Place 1-2 drops into both eyes 3 (three) times daily as needed (for dry eyes.).  Marland Kitchen ipratropium-albuterol (DUONEB) 0.5-2.5 (3) MG/3ML SOLN Take 3 mLs by nebulization 3 (three) times daily as needed (for shortness of breath/wheezing.). For shortness of breath.   . levothyroxine (SYNTHROID, LEVOTHROID) 112 MCG tablet Take 1 tablet (112 mcg total) by mouth daily before breakfast.  . loratadine (CLARITIN) 10 MG tablet Take 10 mg by mouth daily as needed (for allergies.).   Marland Kitchen lovastatin (MEVACOR) 40 MG tablet TAKE 2 TABLETS BY MOUTH AT  BEDTIME  . Multiple Vitamins-Minerals (MULTIVITAMINS THER. W/MINERALS) TABS Take 1 tablet by mouth daily.    . nitroGLYCERIN (NITROSTAT) 0.4 MG SL tablet Place 1 tablet (0.4 mg total) under the tongue every 5 (five) minutes as needed for chest pain.  . pantoprazole (PROTONIX) 40 MG tablet Take 1 tablet (40 mg total) by mouth 2 (two) times daily.  Marland Kitchen spironolactone (ALDACTONE) 25 MG tablet  TAKE 1/2 TABLET BY MOUTH EVERY DAY  . sucralfate (CARAFATE) 1 g tablet Take 1 tablet (1 g total) by mouth 4 (four) times daily -  with meals and at bedtime. With breakfast and supper.  . warfarin (COUMADIN) 5 MG tablet TAKE 1 TABLET BY MOUTH  (5MG ) ON MONDAY,  WEDNESDAY, AND FRIDAY AND 1/2 TABLET (2.5MG ) ALL OTHER DAYS  . [DISCONTINUED] carvedilol (COREG) 3.125 MG tablet Take 1 tablet (3.125 mg total) by mouth 2 (two) times daily with a meal.  . [DISCONTINUED] sacubitril-valsartan (ENTRESTO) 97-103 MG Take 1 tablet by mouth 2 (two) times daily.   No facility-administered encounter medications on file as of 08/08/2018.      REVIEW OF SYSTEMS  : All other systems reviewed and negative except where noted in the History of Present Illness.   PHYSICAL EXAM: BP (!) 130/50 (  BP Location: Left Arm, Patient Position: Sitting, Cuff Size: Normal)   Pulse 60   Ht 5' 7.75" (1.721 m) Comment: height measured without shoes  Wt 178 lb 2 oz (80.8 kg)   BMI 27.28 kg/m  General: Well developed white male in no acute distress Head: Normocephalic and atraumatic Eyes:  Sclerae anicteric, conjunctiva pink. Ears: Normal auditory acuity Lungs: Clear throughout to auscultation; no increased WOB. Heart:  RRR.  No M/R/G.  Abdomen: Soft, non-distended.  BS present.  Non-tender. Musculoskeletal: Symmetrical with no gross deformities  Skin: No lesions on visible extremities Extremities: No edema  Neurological: Alert oriented x 4, grossly non-focal Psychological:  Alert and cooperative. Normal mood and affect  ASSESSMENT AND PLAN: *77 year old male with history of GAVE and proximal small bowel AVMs when evaluated in the past for iron deficiency anemia.  GAVE was ablated.  He is here again with recurrent anemia, heme positive stools.  Hemoglobin is 11.3 g, only down about a gram from his baseline.  Denies overt bleeding.  Suspect that this is likely from his GAVE or small bowel AVMs in the setting of chronic  anticoagulation with Coumadin.  Last colonoscopy was 6 years ago for evaluation of the same issues and was normal at that time.  He is complaining of extreme weakness and fatigue.  Also complained of weakness and dizziness with taking deep breaths today.  I am not convinced that this weakness and fatigue that he is describing is solely from the 1 g drop in his hemoglobin.  Nonetheless, he will likely require GI evaluation again in the near future with EGD, but I would like him to be seen and cleared by cardiology first to be sure that there is nothing else cardiac going on before we sedate him and request holding Coumadin.  We have him made him an appointment to see cardiology and he will follow-up here after that visit.  In the interim I told him to try to increase his ferrous sulfate to BID if he can tolerate it.     CC:  Claretta Fraise, MD  Addendum: Reviewed and agree with assessment and management plan. Pyrtle, Lajuan Lines, MD

## 2018-08-10 ENCOUNTER — Encounter (HOSPITAL_COMMUNITY): Payer: Medicare Other

## 2018-08-10 ENCOUNTER — Ambulatory Visit: Payer: Medicare Other | Admitting: Family

## 2018-08-11 ENCOUNTER — Ambulatory Visit (HOSPITAL_COMMUNITY)
Admission: RE | Admit: 2018-08-11 | Discharge: 2018-08-11 | Disposition: A | Discharge status: Home / Self Care | Payer: Medicare Other | Source: Ambulatory Visit | Attending: Family Medicine | Admitting: Family Medicine

## 2018-08-11 ENCOUNTER — Encounter: Payer: Self-pay | Admitting: Family

## 2018-08-11 ENCOUNTER — Ambulatory Visit: Payer: Medicare Other | Admitting: Family

## 2018-08-11 ENCOUNTER — Other Ambulatory Visit: Payer: Self-pay

## 2018-08-11 VITALS — BP 149/65 | HR 59 | Temp 97.1°F | Resp 16 | Ht 69.5 in | Wt 178.0 lb

## 2018-08-11 DIAGNOSIS — I6523 Occlusion and stenosis of bilateral carotid arteries: Secondary | ICD-10-CM | POA: Diagnosis not present

## 2018-08-11 NOTE — Patient Instructions (Signed)

## 2018-08-11 NOTE — Progress Notes (Signed)
Chief Complaint: Follow up Extracranial Carotid Artery Stenosis   History of Present Illness  [name] [name] is a 77 y.o. male whom Dr. Donnetta Hutching has been monitoring for bilateral carotid stenosis.    He denies any known history of stroke or TIA. Specifically he denies a history of amaurosis fugax or monocular blindness, unilateral facial drooping, hemiplegia, or receptive or expressive aphasia.  He has multiple medical problems including congestive heart failure pacemaker and AICD.  He has chronic atrial fibrillation.  Also has chronic renal insufficiency.  He underwent duplex at [facility] discussed hospital demonstrating greater than 70% stenoses bilaterally.  Patient has not had previous carotid artery intervention.  Dr. Donnetta Hutching last evaluated pt on 03-03-18. At that time bilateral ICA stenosis was in the 60 to 79% evel bilaterally. Dr. Donnetta Hutching had a long discussion with patient regarding his carotid stenosis at that visit.  He was asymptomatic.  Dr. Donnetta Hutching explained that he is below the threshold of where we would recommend surgery for asymptomatic disease.  At his level we would recommend 58-month interval duplex lifelong to rule out progression of stenosis.  If he does have symptoms suggestive carotid disease, he will present immediately to the emergency room for further evaluation.  Otherwise pt was to return in 6 months with repeat carotid duplex.  Pt states that he has injured his back, fell years ago and injured his lumbar spine.  He has occasional left foot numbness.   [facility]    Diabetic: no Tobacco use: former smoker, quit in 1998, smoked x 42 years  Pt meds include: Statin : yes ASA: no Other anticoagulants/antiplatelets: warfarin, has atrial fib   Past Medical History:  Diagnosis Date  . 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 Anemia   . Arthritis    "hips; back" (07/09/2014)  . Asthma   . Atrial fibrillation ([place])    . CAD (coronary artery disease)   . CHF (congestive heart failure) ([place])   . Cholecystitis 11/2013  . 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   . COPD (chronic obstructive pulmonary disease) ([facility])    on home oxygen, 2 liters at night10/2015  . Esophageal reflux   . Gallstones   . Gastric antral vascular ectasia 2013  . Gout   . Hepatomegaly   . Hiatal hernia   . History of blood transfusion ~ 2012   "blood count dropped; had to get 3 units"  . Hyperlipidemia   . Hypothyroidism   . Other and unspecified coagulation defects   . Personal history of colonic polyps 05/29/2010   TUBULAR ADENOMA  . Unspecified essential hypertension     Social History Social History   Tobacco Use  . Smoking status: Former Smoker    Packs/day: 1.00    Years: 42.00    Pack years: 42.00    Types: Cigarettes    Last attempt to quit: 12/16/1996    Years since quitting: 21.6  . Smokeless tobacco: Never Used  Substance Use Topics  . Alcohol use: Yes    Alcohol/week: 1.0 standard drinks    Types: 1 Cans of beer per week    Comment: 1 per week  . Drug use: No    Family History Family History  Problem Relation Age of Onset  . Leukemia Mother   . Kidney disease Mother        kidney removed   . Heart attack Brother   . Heart disease Brother   .  Heart disease Father   . Stomach cancer Sister   . Stomach cancer Sister   . Early death Brother   . Hypertension Unknown        family   . Colon cancer Neg Hx     Surgical History Past Surgical History:  Procedure Laterality Date  . CARDIAC CATHETERIZATION N/A 07/15/2016   Procedure: Right/Left Heart Cath and Coronary/Graft Angiography;  Surgeon: Belva Crome, MD;  Location: [facility];  Service: Cardiovascular;  Laterality: N/A;  . cholecystomy tube  12/28/2014 - 01/06/2015   tube clogged with debris, removed and IR unable to place new tube.   . COLONOSCOPY  2013   Dr. Sharlett Iles: no polyps or evidence of active bleeding  .  CORONARY ANGIOPLASTY WITH STENT PLACEMENT  ~ 2008; 07/08/2014   "1 + 3"  . CORONARY ARTERY BYPASS GRAFT  1998   CABG X4  . ESOPHAGOGASTRODUODENOSCOPY  2013   Dr. 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 ESOPHAGOGASTRODUODENOSCOPY N/A 12/12/2015   Procedure: ESOPHAGOGASTRODUODENOSCOPY (EGD);  Surgeon: Gatha Mayer, MD;  Location: [facility];  Service: Endoscopy;  Laterality: N/A;  . GIVENS CAPSULE STUDY  2013   Dr. Sharlett Iles: AVM at 1 and blood at 30 minutes beyond first duodenal image but not actual lesion seen. If persistent IDA, bleeding, recommend enteroscopy with ablation   . HERNIA REPAIR    . ICD IMPLANT N/A 04/05/2017   Procedure: ICD Implant;  Surgeon: Evans Lance, MD;  Location: [facility];  Service: Cardiovascular;  Laterality: N/A;  . LEFT AND RIGHT HEART CATHETERIZATION WITH CORONARY/GRAFT ANGIOGRAM N/A 07/09/2014   Right and left heart cath, bare metal stent to SVG to RCA. Sinclair Grooms, MD;   . UMBILICAL HERNIA REPAIR  [phone]    No Known Allergies  Current Outpatient Medications  Medication Sig Dispense Refill  . albuterol (PROAIR HFA) 108 (90 Base) MCG/ACT inhaler Use 2 puffs every 6 hours  as needed for shortness of  breath 34 g 3  . budesonide (PULMICORT) 0.25 MG/2ML nebulizer solution Take 0.25 mg by nebulization 3 (three) times daily as needed (shortness of breath).     . carvedilol (COREG) 3.125 MG tablet TAKE 1 TABLET (3.125 MG TOTAL) BY MOUTH 2 (TWO) TIMES DAILY WITH A MEAL. 30 tablet 0  . Cholecalciferol (VITAMIN D3) 5000 UNITS CAPS Take 5,000 Units by mouth daily.     Marland Kitchen docusate sodium (COLACE) 100 MG capsule Take 100 mg by mouth daily.     Marland Kitchen ENTRESTO 97-103 MG TAKE 1 TABLET BY MOUTH 2 TIMES DAILY. PLEASE CALL FOR OFFICE VISIT 60 tablet 2  . ferrous sulfate 325 (65 FE) MG tablet Take 325 mg by mouth 2 (two) times daily.    . fluticasone furoate-vilanterol (BREO ELLIPTA) 200-25 MCG/INH AEPB Inhale 1  puff into the lungs daily. 28 each 2  . furosemide (LASIX) 40 MG tablet Take 1 tablet (40 mg total) by mouth daily. 90 tablet 1  . hydrALAZINE (APRESOLINE) 50 MG tablet TAKE 1 TABLET BY MOUTH THREE TIMES A DAY 270 tablet 2  . Hypromell-Glycerin-Naphazoline (CLEAR EYES FOR DRY EYES PLUS) 0.8-0.25-0.012 % SOLN Place 1-2 drops into both eyes 3 (three) times daily as needed (for dry eyes.).    Marland Kitchen ipratropium-albuterol (DUONEB) 0.5-2.5 (3) MG/3ML SOLN Take 3 mLs by nebulization 3 (three) times daily as needed (for shortness of breath/wheezing.). For shortness of breath.     . levothyroxine (SYNTHROID, LEVOTHROID) 112  MCG tablet Take 1 tablet (112 mcg total) by mouth daily before breakfast. 90 tablet 1  . loratadine (CLARITIN) 10 MG tablet Take 10 mg by mouth daily as needed (for allergies.).     Marland Kitchen lovastatin (MEVACOR) 40 MG tablet TAKE 2 TABLETS BY MOUTH AT  BEDTIME 180 tablet 3  . Multiple Vitamins-Minerals (MULTIVITAMINS THER. W/MINERALS) TABS Take 1 tablet by mouth daily.      . pantoprazole (PROTONIX) 40 MG tablet Take 1 tablet (40 mg total) by mouth 2 (two) times daily. 180 tablet 1  . spironolactone (ALDACTONE) 25 MG tablet TAKE 1/2 TABLET BY MOUTH EVERY DAY 15 tablet 0  . sucralfate (CARAFATE) 1 g tablet Take 1 tablet (1 g total) by mouth 4 (four) times daily -  with meals and at bedtime. With breakfast and supper. 360 tablet 1  . warfarin (COUMADIN) 5 MG tablet TAKE 1 TABLET BY MOUTH  (5MG ) ON MONDAY,  WEDNESDAY, AND FRIDAY AND 1/2 TABLET (2.5MG ) ALL OTHER DAYS 90 tablet 0  . nitroGLYCERIN (NITROSTAT) 0.4 MG SL tablet Place 1 tablet (0.4 mg total) under the tongue every 5 (five) minutes as needed for chest pain. (Patient not taking: Reported on 08/11/2018) 6 tablet 1   No current facility-administered medications for this visit.     Review of Systems : See HPI for pertinent positives and negatives.  Physical Examination  Vitals:   08/11/18 1140 08/11/18 1144  BP: (!) 151/62 (!) 149/65   Pulse: (!) 59 (!) 59  Resp: 16   Temp: (!) 97.1 F (36.2 C)   TempSrc: Oral   Weight: 178 lb (80.7 kg)   Height: 5' 9.5" (1.765 m)    Body mass index is 25.91 kg/m.  General: WDWN male in NAD GAIT: normal Eyes: PERRLA HENT: No gross abnormalities.  Pulmonary:  Respirations are non-labored, good air movement in all fields, CTAB, no rales, no rhonchi, or wheezing. Cardiac: regular rhythm, no detected murmur. AICD palpated left upper chest.   VASCULAR EXAM Carotid Bruits Right Left   Negative Negative     Abdominal aortic pulse is not palpable. Radial pulses are 2+ palpable and equal.                                                                                                                       Gastrointestinal: soft, nontender, BS WNL, no r/g, no palpable masses. Musculoskeletal: no muscle atrophy/wasting. M/S 5/5 throughout, extremities without ischemic changes. Skin: No rashes, no ulcers, no cellulitis.   Neurologic:  A&O X 3; appropriate affect, sensation is normal; speech is normal, CN 2-12 intact, pain and light touch intact in extremities, motor exam as listed above. Psychiatric: Normal thought content, mood appropriate to clinical situation.    Assessment: [name] [name] is a 77 y.o. male who has no history of stroke or TIA.   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  Fortunately he does not have DM and exercises 4-5 days/week.  He takes warfarin for a hx of atrial fib, takes a daily statin.     DATA Carotid Duplex (08-11-18): 60-79% bilateral ICA stenosis. Bilateral vertebral artery flow is antegrade.  Bilateral subclavian artery waveforms are normal.  No change compared to the exam on 03-03-18.    Plan: Follow-up in 6 months with Carotid Duplex scan.   I discussed in depth with the patient the nature of atherosclerosis, and emphasized the importance of maximal  medical management including strict control of blood pressure, blood glucose, and lipid levels, obtaining regular exercise, and continued cessation of smoking.  The patient is aware that without maximal medical management the underlying atherosclerotic disease process will progress, limiting the benefit of any interventions. The patient was given information about stroke prevention and what symptoms should prompt the patient to seek immediate medical care. Thank you for allowing [place] to participate in this patient's care.  Clemon Chambers, RN, MSN, FNP-C [facility] Office: [phone]  Clinic Physician: Donzetta Matters  08/11/18 11:53 AM

## 2018-08-16 NOTE — Progress Notes (Signed)
Electrophysiology Office Note Date: 08/16/2018  ID:  [name] [name] 1940-12-08, MRN [id number]  PCP: Claretta Fraise, MD Primary Cardiologist: Tamala Julian Heart Failure: Aundra Dubin Electrophysiologist: Lovena Le  CC: Routine ICD follow-up  [name] [name] is a 77 y.o. male seen today for Dr Lovena Le.  He presents today for routine electrophysiology followup.  Since last being seen in our clinic, the patient reports doing relatively well.  He has good days and bad days but more bad days than good.  He has had increased shortness of breath with exertion and fatigue. There was concern for recurrent GI bleed but Hgb has been relatively stable. GI asked him to be seen back here to evaluate other causes for symptoms.  He denies chest pain, palpitations, PND, orthopnea, nausea, vomiting, syncope, edema, weight gain, or early satiety.  He has not had ICD shocks.    Past Medical History:  Diagnosis Date  . 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 Anemia   . Arthritis    "hips; back" (07/09/2014)  . Asthma   . Atrial fibrillation ([place])   . CAD (coronary artery disease)   . CHF (congestive heart failure) ([place])   . Cholecystitis 11/2013  . 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   . COPD (chronic obstructive pulmonary disease) ([facility])    on home oxygen, 2 liters at night10/2015  . Esophageal reflux   . Gallstones   . Gastric antral vascular ectasia 2013  . Gout   . Hepatomegaly   . Hiatal hernia   . History of blood transfusion ~ 2012   "blood count dropped; had to get 3 units"  . Hyperlipidemia   . Hypothyroidism   . Other and unspecified coagulation defects   . Personal history of colonic polyps 05/29/2010   TUBULAR ADENOMA  . Unspecified essential hypertension    Past Surgical History:  Procedure Laterality Date  . CARDIAC CATHETERIZATION N/A 07/15/2016   Procedure: Right/Left Heart Cath and Coronary/Graft Angiography;  Surgeon: Belva Crome, MD;  Location:  [facility];  Service: Cardiovascular;  Laterality: N/A;  . cholecystomy tube  12/28/2014 - 01/06/2015   tube clogged with debris, removed and IR unable to place new tube.   . COLONOSCOPY  2013   Dr. Sharlett Iles: no polyps or evidence of active bleeding  . CORONARY ANGIOPLASTY WITH STENT PLACEMENT  ~ 2008; 07/08/2014   "1 + 3"  . CORONARY ARTERY BYPASS GRAFT  1998   CABG X4  . ESOPHAGOGASTRODUODENOSCOPY  2013   Dr. 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 ESOPHAGOGASTRODUODENOSCOPY N/A 12/12/2015   Procedure: ESOPHAGOGASTRODUODENOSCOPY (EGD);  Surgeon: Gatha Mayer, MD;  Location: [facility];  Service: Endoscopy;  Laterality: N/A;  . GIVENS CAPSULE STUDY  2013   Dr. Sharlett Iles: AVM at 42 and blood at 30 minutes beyond first duodenal image but not actual lesion seen. If persistent IDA, bleeding, recommend enteroscopy with ablation   . HERNIA REPAIR    . ICD IMPLANT N/A 04/05/2017   Procedure: ICD Implant;  Surgeon: Evans Lance, MD;  Location: [facility];  Service: Cardiovascular;  Laterality: N/A;  . LEFT AND RIGHT HEART CATHETERIZATION WITH CORONARY/GRAFT ANGIOGRAM N/A 07/09/2014   Right and left heart cath, bare metal stent to SVG to RCA. Sinclair Grooms, MD;   . UMBILICAL HERNIA REPAIR  [phone]    Current Outpatient Medications  Medication Sig Dispense Refill  . albuterol (PROAIR HFA) 108 (  90 Base) MCG/ACT inhaler Use 2 puffs every 6 hours  as needed for shortness of  breath 34 g 3  . budesonide (PULMICORT) 0.25 MG/2ML nebulizer solution Take 0.25 mg by nebulization 3 (three) times daily as needed (shortness of breath).     . carvedilol (COREG) 3.125 MG tablet TAKE 1 TABLET (3.125 MG TOTAL) BY MOUTH 2 (TWO) TIMES DAILY WITH A MEAL. 30 tablet 0  . Cholecalciferol (VITAMIN D3) 5000 UNITS CAPS Take 5,000 Units by mouth daily.     Marland Kitchen docusate sodium (COLACE) 100 MG capsule Take 100 mg by mouth daily.     Marland Kitchen ENTRESTO 97-103 MG TAKE  1 TABLET BY MOUTH 2 TIMES DAILY. PLEASE CALL FOR OFFICE VISIT 60 tablet 2  . ferrous sulfate 325 (65 FE) MG tablet Take 325 mg by mouth 2 (two) times daily.    . fluticasone furoate-vilanterol (BREO ELLIPTA) 200-25 MCG/INH AEPB Inhale 1 puff into the lungs daily. 28 each 2  . furosemide (LASIX) 40 MG tablet Take 1 tablet (40 mg total) by mouth daily. 90 tablet 1  . hydrALAZINE (APRESOLINE) 50 MG tablet TAKE 1 TABLET BY MOUTH THREE TIMES A DAY 270 tablet 2  . Hypromell-Glycerin-Naphazoline (CLEAR EYES FOR DRY EYES PLUS) 0.8-0.25-0.012 % SOLN Place 1-2 drops into both eyes 3 (three) times daily as needed (for dry eyes.).    Marland Kitchen ipratropium-albuterol (DUONEB) 0.5-2.5 (3) MG/3ML SOLN Take 3 mLs by nebulization 3 (three) times daily as needed (for shortness of breath/wheezing.). For shortness of breath.     . levothyroxine (SYNTHROID, LEVOTHROID) 112 MCG tablet Take 1 tablet (112 mcg total) by mouth daily before breakfast. 90 tablet 1  . loratadine (CLARITIN) 10 MG tablet Take 10 mg by mouth daily as needed (for allergies.).     Marland Kitchen lovastatin (MEVACOR) 40 MG tablet TAKE 2 TABLETS BY MOUTH AT  BEDTIME 180 tablet 3  . Multiple Vitamins-Minerals (MULTIVITAMINS THER. W/MINERALS) TABS Take 1 tablet by mouth daily.      . nitroGLYCERIN (NITROSTAT) 0.4 MG SL tablet Place 1 tablet (0.4 mg total) under the tongue every 5 (five) minutes as needed for chest pain. (Patient not taking: Reported on 08/11/2018) 6 tablet 1  . pantoprazole (PROTONIX) 40 MG tablet Take 1 tablet (40 mg total) by mouth 2 (two) times daily. 180 tablet 1  . spironolactone (ALDACTONE) 25 MG tablet TAKE 1/2 TABLET BY MOUTH EVERY DAY 15 tablet 0  . sucralfate (CARAFATE) 1 g tablet Take 1 tablet (1 g total) by mouth 4 (four) times daily -  with meals and at bedtime. With breakfast and supper. 360 tablet 1  . warfarin (COUMADIN) 5 MG tablet TAKE 1 TABLET BY MOUTH  (5MG ) ON MONDAY,  WEDNESDAY, AND FRIDAY AND 1/2 TABLET (2.5MG ) ALL OTHER DAYS 90 tablet 0     No current facility-administered medications for this visit.     Allergies:   Patient has no known allergies.   Social History: Social History   Socioeconomic History  . Marital status: Married    Spouse name: [name]  . Number of children: 2  . Years of education: Not on file  . Highest education level: Not on file  Occupational History  . Occupation: retired    Comment: [employer]  Social Needs  . Financial resource strain: Not on file  . Food insecurity:    Worry: Not on file    Inability: Not on file  . Transportation needs:    Medical: Not on file  Non-medical: Not on file  Tobacco Use  . Smoking status: Former Smoker    Packs/day: 1.00    Years: 42.00    Pack years: 42.00    Types: Cigarettes    Last attempt to quit: 12/16/1996    Years since quitting: 21.6  . Smokeless tobacco: Never Used  Substance and Sexual Activity  . Alcohol use: Yes    Alcohol/week: 1.0 standard drinks    Types: 1 Cans of beer per week    Comment: 1 per week  . Drug use: No  . Sexual activity: Never  Lifestyle  . Physical activity:    Days per week: Not on file    Minutes per session: Not on file  . Stress: Not on file  Relationships  . Social connections:    Talks on phone: Not on file    Gets together: Not on file    Attends religious service: Not on file    Active member of club or organization: Not on file    Attends meetings of clubs or organizations: Not on file    Relationship status: Not on file  . Intimate partner violence:    Fear of current or ex partner: Not on file    Emotionally abused: Not on file    Physically abused: Not on file    Forced sexual activity: Not on file  Other Topics Concern  . Not on file  Social History Narrative  . Not on file    Family History: Family History  Problem Relation Age of Onset  . Leukemia Mother   . Kidney disease Mother        kidney removed   . Heart attack Brother   . Heart disease Brother   . Heart  disease Father   . Stomach cancer Sister   . Stomach cancer Sister   . Early death Brother   . Hypertension Unknown        family   . Colon cancer Neg Hx     Review of Systems: All other systems reviewed and are otherwise negative except as noted above.   Physical Exam: VS:  There were no vitals taken for this visit. , BMI There is no height or weight on file to calculate BMI.  GEN- The patient is well appearing, alert and oriented x 3 today.   HEENT: normocephalic, atraumatic; sclera clear, conjunctiva pink; hearing intact; oropharynx clear; neck supple  Lungs- Clear to ausculation bilaterally, normal work of breathing.  No wheezes, rales, rhonchi Heart- Regular rate and rhythm (paced) GI- soft, non-tender, non-distended, bowel sounds present  Extremities- no clubbing, cyanosis, or edema  MS- no significant deformity or atrophy Skin- warm and dry, no rash or lesion; ICD pocket well healed Psych- euthymic mood, full affect Neuro- strength and sensation are intact  ICD interrogation- reviewed in detail today,  See PACEART report   Recent Labs: 07/18/2018: ALT 13; BUN 30; Creatinine, Ser 1.50; Hemoglobin 11.3; Platelets 279; Potassium 4.3; Sodium 141; TSH 1.740   Wt Readings from Last 3 Encounters:  08/11/18 178 lb (80.7 kg)  08/08/18 178 lb 2 oz (80.8 kg)  07/18/18 177 lb 12.8 oz (80.6 kg)     Other studies Reviewed: Additional studies/ records that were reviewed today include: GI notes, Dr Tanna Furry notes   Assessment and Plan:  1.  Chronic systolic dysfunction euvolemic today Stable on an appropriate medical regimen His Bundle lead (in RA port) with increased threshold today and output was programmed at threshold. Output  increased. Impedance stable.  See [facility]ct[facility] report Will update echo post His Bundle pacing to re-evaluate EF  2.  Permanent atrial fibrillation V rates controlled Continue Warfarin   3.  Fatigue/increased shortness of breath Will check  labs Update echo as above If echo unrevealing, consider CPX testing   Current medicines are reviewed at length with the patient today.   The patient does not have concerns regarding his medicines.  The following changes were made today:  none  Labs/ tests ordered today include: none No orders of the defined types were placed in this encounter.    Disposition:   Follow up with me in 4-6 weeks    Signed, Chanetta Marshall, NP 08/16/2018 3:47 PM  [place] [address] [phone] (office) [phone] (fax)

## 2018-08-17 ENCOUNTER — Ambulatory Visit: Payer: Medicare Other | Admitting: Nurse Practitioner

## 2018-08-17 ENCOUNTER — Encounter: Payer: Self-pay | Admitting: Nurse Practitioner

## 2018-08-17 VITALS — BP 120/58 | HR 60 | Ht 69.5 in | Wt 180.6 lb

## 2018-08-17 DIAGNOSIS — I4821 Permanent atrial fibrillation: Secondary | ICD-10-CM

## 2018-08-17 DIAGNOSIS — I5022 Chronic systolic (congestive) heart failure: Secondary | ICD-10-CM

## 2018-08-17 LAB — CUP PACEART INCLINIC DEVICE CHECK
Date Time Interrogation Session: 20191114095143
Implantable Lead Implant Date: 20180703
Implantable Lead Implant Date: 20180703
Implantable Lead Location: 753859
Implantable Lead Model: 293
Implantable Lead Model: 3830
Implantable Lead Serial Number: 433180
MDC IDC LEAD LOCATION: 753860
MDC IDC PG IMPLANT DT: 20180703
MDC IDC PG SERIAL: 533886

## 2018-08-17 LAB — BASIC METABOLIC PANEL
BUN / CREAT RATIO: 15 (ref 10–24)
BUN: 24 mg/dL (ref 8–27)
CHLORIDE: 103 mmol/L (ref 96–106)
CO2: 25 mmol/L (ref 20–29)
Calcium: 9.3 mg/dL (ref 8.6–10.2)
Creatinine, Ser: 1.58 mg/dL — ABNORMAL HIGH (ref 0.76–1.27)
GFR calc Af Amer: 47 mL/min/{1.73_m2} — ABNORMAL LOW (ref >59)
GFR calc non Af Amer: 41 mL/min/{1.73_m2} — ABNORMAL LOW (ref >59)
GLUCOSE: 84 mg/dL (ref 65–99)
Potassium: 4.7 mmol/L (ref 3.5–5.2)
Sodium: 142 mmol/L (ref 134–144)

## 2018-08-17 LAB — CBC
Hematocrit: 34.8 % — ABNORMAL LOW (ref 37.5–51.0)
Hemoglobin: 11.9 g/dL — ABNORMAL LOW (ref 13.0–17.7)
MCH: 32.4 pg (ref 26.6–33.0)
MCHC: 34.2 g/dL (ref 31.5–35.7)
MCV: 95 fL (ref 79–97)
PLATELETS: 265 10*3/uL (ref 150–450)
RBC: 3.67 x10E6/uL — ABNORMAL LOW (ref 4.14–5.80)
RDW: 11.9 % — ABNORMAL LOW (ref 12.3–15.4)
WBC: 11.3 10*3/uL — ABNORMAL HIGH (ref 3.4–10.8)

## 2018-08-17 LAB — TSH: TSH: 0.468 u[IU]/mL (ref 0.450–4.500)

## 2018-08-17 NOTE — Patient Instructions (Addendum)
Medication Instructions:  Your physician recommends that you continue on your current medications as directed. Please refer to the Current Medication list given to you today.   If you need a refill on your cardiac medications before your next appointment, please call your pharmacy.   Lab work:   [place] [facility]   If you have labs (blood work) drawn today and your tests are completely normal, you will receive your results only by: Marland Kitchen MyChart Message (if you have MyChart) OR . A paper copy in the mail If you have any lab test that is abnormal or we need to change your treatment, we will call you to review the results.  Testing/Procedures:  Your physician has requested that you have an echocardiogram. Echocardiography is a painless test that uses sound waves to create images of your heart. It provides your doctor with information about the size and shape of your heart and how well your heart's chambers and valves are working. This procedure takes approximately one hour. There are no restrictions for this procedure.    Follow-Up:  BASED UPON RESULTS   Any Other Special Instructions Will Be Listed Below (If Applicable)

## 2018-08-21 ENCOUNTER — Telehealth: Payer: Self-pay

## 2018-08-21 ENCOUNTER — Other Ambulatory Visit (HOSPITAL_COMMUNITY): Payer: Self-pay | Admitting: Internal Medicine

## 2018-08-21 NOTE — Telephone Encounter (Signed)
Notes recorded by Frederik Schmidt, RN on 08/21/2018 at 9:54 AM EST The patient has been notified of the result and verbalized understanding. All questions (if any) were answered. Frederik Schmidt, RN 08/21/2018 9:54 AM

## 2018-08-21 NOTE — Telephone Encounter (Signed)
-----   Message from Patsey Berthold, NP sent at 08/18/2018  5:31 PM EST ----- All values are stable for patient.   Medication changes / Follow up labs / Other changes or recommendations:   No changes for now - will await echo  Chanetta Marshall, NP 08/18/2018 5:30 PM

## 2018-08-22 ENCOUNTER — Other Ambulatory Visit (HOSPITAL_COMMUNITY): Payer: Self-pay | Admitting: Internal Medicine

## 2018-08-22 ENCOUNTER — Other Ambulatory Visit: Payer: Self-pay

## 2018-08-22 ENCOUNTER — Ambulatory Visit (HOSPITAL_COMMUNITY): Payer: Medicare Other | Attending: Cardiology

## 2018-08-22 ENCOUNTER — Ambulatory Visit: Payer: Medicare Other | Admitting: Gastroenterology

## 2018-08-22 DIAGNOSIS — I5022 Chronic systolic (congestive) heart failure: Secondary | ICD-10-CM | POA: Diagnosis not present

## 2018-08-23 ENCOUNTER — Other Ambulatory Visit (HOSPITAL_COMMUNITY): Payer: Self-pay | Admitting: Internal Medicine

## 2018-08-24 ENCOUNTER — Other Ambulatory Visit (HOSPITAL_COMMUNITY): Payer: Self-pay

## 2018-08-28 ENCOUNTER — Telehealth: Payer: Self-pay

## 2018-08-28 DIAGNOSIS — J449 Chronic obstructive pulmonary disease, unspecified: Secondary | ICD-10-CM | POA: Diagnosis not present

## 2018-08-28 NOTE — Telephone Encounter (Signed)
-----   Message from Patsey Berthold, NP sent at 08/28/2018  7:43 AM EST ----- Please notify patient of echo results. EF has normalized post pacing - great news!  Please check to see how he has been doing since I saw him in the office.  Thanks! Museum/gallery conservator

## 2018-08-28 NOTE — Telephone Encounter (Signed)
Notes recorded by Frederik Schmidt, RN on 08/28/2018 at 9:31 AM EST The patient's wife has been notified of the result and verbalized understanding. He has been doing well since last OV. All questions (if any) were answered. Frederik Schmidt, RN 08/28/2018 9:30 AM

## 2018-09-04 ENCOUNTER — Other Ambulatory Visit (HOSPITAL_COMMUNITY): Payer: Self-pay | Admitting: Cardiology

## 2018-09-05 ENCOUNTER — Other Ambulatory Visit (HOSPITAL_COMMUNITY): Payer: Self-pay | Admitting: Pharmacist

## 2018-09-05 ENCOUNTER — Other Ambulatory Visit (HOSPITAL_COMMUNITY): Payer: Self-pay

## 2018-09-05 MED ORDER — SPIRONOLACTONE 25 MG PO TABS: 12.5000 mg | ORAL_TABLET | Freq: Every day | ORAL | 1 refills | Status: DC

## 2018-09-05 MED ORDER — CARVEDILOL 3.125 MG PO TABS: 3.1250 mg | ORAL_TABLET | Freq: Two times a day (BID) | ORAL | 1 refills | Status: DC

## 2018-09-05 MED ORDER — SACUBITRIL-VALSARTAN 97-103 MG PO TABS: 1.0000 | ORAL_TABLET | Freq: Two times a day (BID) | ORAL | 5 refills | Status: DC

## 2018-09-06 ENCOUNTER — Ambulatory Visit: Payer: Medicare Other | Admitting: Pharmacist Clinician (PhC)/ Clinical Pharmacy Specialist

## 2018-09-06 DIAGNOSIS — I482 Chronic atrial fibrillation, unspecified: Secondary | ICD-10-CM

## 2018-09-06 LAB — COAGUCHEK XS/INR WAIVED
INR: 2.5 — AB (ref 0.9–1.1)
PROTHROMBIN TIME: 29.7 s

## 2018-09-06 NOTE — Patient Instructions (Signed)
Description   Change to taking 1 tablet on Mondays and Wednesdays and 1/2 tablet all other days of the week.  Next week make your 1 tablet on Mondays and Fridays and 1/2 tablets all other days of the week.    Goal is 1.8-2.2  INR 2.5  Today ( a little thin today)  Eat a large serving of turnip greens today

## 2018-09-08 ENCOUNTER — Ambulatory Visit: Payer: Medicare Other | Admitting: Internal Medicine

## 2018-09-19 DIAGNOSIS — J449 Chronic obstructive pulmonary disease, unspecified: Secondary | ICD-10-CM | POA: Diagnosis not present

## 2018-09-25 ENCOUNTER — Ambulatory Visit (INDEPENDENT_AMBULATORY_CARE_PROVIDER_SITE_OTHER): Payer: Medicare Other | Admitting: Pharmacist Clinician (PhC)/ Clinical Pharmacy Specialist

## 2018-09-25 DIAGNOSIS — I482 Chronic atrial fibrillation, unspecified: Secondary | ICD-10-CM | POA: Diagnosis not present

## 2018-09-25 LAB — COAGUCHEK XS/INR WAIVED
INR: 2.2 — AB (ref 0.9–1.1)
PROTHROMBIN TIME: 26 s

## 2018-09-25 NOTE — Patient Instructions (Signed)
Description   Continue taking 1 tablet on Mondays and Fridays and 1/2 tablet all other days of the week.    Goal is 1.8-2.2  INR 2.2 today  Perfect reading

## 2018-09-27 DIAGNOSIS — J449 Chronic obstructive pulmonary disease, unspecified: Secondary | ICD-10-CM | POA: Diagnosis not present

## 2018-10-05 ENCOUNTER — Telehealth: Payer: Self-pay | Admitting: Internal Medicine

## 2018-10-05 ENCOUNTER — Ambulatory Visit (INDEPENDENT_AMBULATORY_CARE_PROVIDER_SITE_OTHER): Payer: Medicare Other

## 2018-10-05 DIAGNOSIS — I255 Ischemic cardiomyopathy: Secondary | ICD-10-CM | POA: Diagnosis not present

## 2018-10-05 LAB — CUP PACEART REMOTE DEVICE CHECK
Date Time Interrogation Session: 20200102183853
Implantable Lead Implant Date: 20180703
Implantable Lead Implant Date: 20180703
Implantable Lead Location: 753859
Implantable Lead Model: 293
Implantable Lead Serial Number: 433180
MDC IDC LEAD LOCATION: 753860
MDC IDC PG IMPLANT DT: 20180703
MDC IDC PG SERIAL: 533886

## 2018-10-05 NOTE — Telephone Encounter (Signed)
New Message:   Patient calling to report his Device check.

## 2018-10-05 NOTE — Progress Notes (Signed)
Remote ICD transmission.   

## 2018-10-05 NOTE — Telephone Encounter (Signed)
Spoke w/ pt wife and informed her that his remote transmission was received. She verbalized understanding.

## 2018-10-10 ENCOUNTER — Other Ambulatory Visit (HOSPITAL_COMMUNITY): Payer: Self-pay | Admitting: Cardiology

## 2018-10-18 ENCOUNTER — Telehealth (HOSPITAL_COMMUNITY): Payer: Self-pay | Admitting: Licensed Clinical Social Worker

## 2018-10-18 ENCOUNTER — Ambulatory Visit (INDEPENDENT_AMBULATORY_CARE_PROVIDER_SITE_OTHER): Payer: Medicare Other | Admitting: Family Medicine

## 2018-10-18 ENCOUNTER — Encounter: Payer: Self-pay | Admitting: Family Medicine

## 2018-10-18 ENCOUNTER — Ambulatory Visit (INDEPENDENT_AMBULATORY_CARE_PROVIDER_SITE_OTHER): Payer: Medicare Other

## 2018-10-18 VITALS — BP 106/55 | HR 59 | Temp 97.3°F | Ht 69.5 in | Wt 180.1 lb

## 2018-10-18 DIAGNOSIS — E038 Other specified hypothyroidism: Secondary | ICD-10-CM

## 2018-10-18 DIAGNOSIS — J449 Chronic obstructive pulmonary disease, unspecified: Secondary | ICD-10-CM

## 2018-10-18 DIAGNOSIS — I482 Chronic atrial fibrillation, unspecified: Secondary | ICD-10-CM | POA: Diagnosis not present

## 2018-10-18 LAB — COAGUCHEK XS/INR WAIVED
INR: 1.8 — ABNORMAL HIGH (ref 0.9–1.1)
PROTHROMBIN TIME: 21.3 s

## 2018-10-18 MED ORDER — WARFARIN SODIUM 5 MG PO TABS: ORAL_TABLET | ORAL | 0 refills | Status: DC

## 2018-10-18 MED ORDER — PANTOPRAZOLE SODIUM 40 MG PO TBEC: 40.0000 mg | DELAYED_RELEASE_TABLET | Freq: Two times a day (BID) | ORAL | 1 refills | Status: DC

## 2018-10-18 MED ORDER — ALBUTEROL SULFATE HFA 108 (90 BASE) MCG/ACT IN AERS: INHALATION_SPRAY | RESPIRATORY_TRACT | 3 refills | Status: DC

## 2018-10-18 MED ORDER — FUROSEMIDE 40 MG PO TABS: 40.0000 mg | ORAL_TABLET | Freq: Every day | ORAL | 1 refills | Status: DC

## 2018-10-18 MED ORDER — LEVOTHYROXINE SODIUM 112 MCG PO TABS: 112.0000 ug | ORAL_TABLET | Freq: Every day | ORAL | 1 refills | Status: DC

## 2018-10-18 MED ORDER — FLUTICASONE FUROATE-VILANTEROL 200-25 MCG/INH IN AEPB: 1.0000 | INHALATION_SPRAY | Freq: Every day | RESPIRATORY_TRACT | 2 refills | Status: DC

## 2018-10-18 MED ORDER — SUCRALFATE 1 G PO TABS: 1.0000 g | ORAL_TABLET | Freq: Three times a day (TID) | ORAL | 1 refills | Status: DC

## 2018-10-18 NOTE — Telephone Encounter (Signed)
CSW called pt to discuss alternative options for assistance with paying for Entresto. Pt wife reports that they were able to get signed up for [place] on their own and that he should have sufficient assistance through the end of the year.  No further needs at this time- CSW will continue to follow through clinic  Jorge Ny, Chancellor Worker [facility] [phone]

## 2018-10-19 LAB — CMP14+EGFR
A/G RATIO: 1.8 (ref 1.2–2.2)
ALT: 11 IU/L (ref 0–44)
AST: 20 IU/L (ref 0–40)
Albumin: 4.2 g/dL (ref 3.5–4.8)
Alkaline Phosphatase: 52 IU/L (ref 39–117)
BILIRUBIN TOTAL: 0.4 mg/dL (ref 0.0–1.2)
BUN/Creatinine Ratio: 15 (ref 10–24)
BUN: 27 mg/dL (ref 8–27)
CHLORIDE: 100 mmol/L (ref 96–106)
CO2: 23 mmol/L (ref 20–29)
Calcium: 9.7 mg/dL (ref 8.6–10.2)
Creatinine, Ser: 1.78 mg/dL — ABNORMAL HIGH (ref 0.76–1.27)
GFR calc Af Amer: 41 mL/min/{1.73_m2} — ABNORMAL LOW (ref >59)
GFR calc non Af Amer: 35 mL/min/{1.73_m2} — ABNORMAL LOW (ref >59)
Globulin, Total: 2.3 g/dL (ref 1.5–4.5)
Glucose: 90 mg/dL (ref 65–99)
POTASSIUM: 4.5 mmol/L (ref 3.5–5.2)
Sodium: 141 mmol/L (ref 134–144)
Total Protein: 6.5 g/dL (ref 6.0–8.5)

## 2018-10-19 LAB — CBC WITH DIFFERENTIAL/PLATELET
BASOS ABS: 0.1 10*3/uL (ref 0.0–0.2)
BASOS: 1 %
EOS (ABSOLUTE): 0.4 10*3/uL (ref 0.0–0.4)
Eos: 4 %
Hematocrit: 35.4 % — ABNORMAL LOW (ref 37.5–51.0)
Hemoglobin: 11.7 g/dL — ABNORMAL LOW (ref 13.0–17.7)
IMMATURE GRANULOCYTES: 0 %
Immature Grans (Abs): 0 10*3/uL (ref 0.0–0.1)
Lymphocytes Absolute: 1.2 10*3/uL (ref 0.7–3.1)
Lymphs: 13 %
MCH: 32.1 pg (ref 26.6–33.0)
MCHC: 33.1 g/dL (ref 31.5–35.7)
MCV: 97 fL (ref 79–97)
MONOS ABS: 1.1 10*3/uL — AB (ref 0.1–0.9)
Monocytes: 11 %
NEUTROS PCT: 71 %
Neutrophils Absolute: 6.9 10*3/uL (ref 1.4–7.0)
PLATELETS: 286 10*3/uL (ref 150–450)
RBC: 3.64 x10E6/uL — ABNORMAL LOW (ref 4.14–5.80)
RDW: 11.9 % (ref 11.6–15.4)
WBC: 9.7 10*3/uL (ref 3.4–10.8)

## 2018-10-19 LAB — TSH: TSH: 1.74 u[IU]/mL (ref 0.450–4.500)

## 2018-10-19 LAB — T4, FREE: FREE T4: 1.68 ng/dL (ref 0.82–1.77)

## 2018-10-26 ENCOUNTER — Other Ambulatory Visit (HOSPITAL_COMMUNITY): Payer: Self-pay | Admitting: Cardiology

## 2018-10-27 ENCOUNTER — Other Ambulatory Visit (HOSPITAL_COMMUNITY): Payer: Self-pay | Admitting: Cardiology

## 2018-10-28 DIAGNOSIS — J449 Chronic obstructive pulmonary disease, unspecified: Secondary | ICD-10-CM | POA: Diagnosis not present

## 2018-10-29 ENCOUNTER — Encounter: Payer: Self-pay | Admitting: Family Medicine

## 2018-10-29 NOTE — Progress Notes (Signed)
Subjective:  Patient ID: [name] [name], male    DOB: 1940/11/01  Age: 78 y.o. MRN: [id number]  CC: Medical Management of Chronic Issues   HPI [name] [name] presents for Patient in for follow-up of atrial fibrillation. Patient denies any recent bouts of chest pain or palpitations. Additionally, patient is taking anticoagulants. Patient denies any recent excessive bleeding episodes including epistaxis, bleeding from the gums, genitalia, rectal bleeding or hematuria. Additionally there has been no excessive bruising with the exception of some on the forearms. Patient also treated for congestive heart failure primarily through cardiology.  [name] [name] does have some peripheral edema intermittently but tells me that today his energy and breathing are at baseline.  Patient presents for follow-up on  thyroid. The patient has a history of hypothyroidism for many years. It has been stable recently. Pt. denies any change in  voice, loss of hair, heat or cold intolerance. Energy level has been adequate to good. Patient denies constipation and diarrhea. No myxedema. Medication is as noted below. Verified that pt is taking it daily on an empty stomach. Well tolerated.  Depression screen [facility] 2/9 10/18/2018 07/18/2018 05/19/2018  Decreased Interest 0 0 2  Down, Depressed, Hopeless 0 0 1  PHQ - 2 Score 0 0 3  Altered sleeping - 0 0  Tired, decreased energy - 0 1  Change in appetite - 0 0  Feeling bad or failure about yourself  - 0 0  Trouble concentrating - 0 0  Moving slowly or fidgety/restless - 0 0  Suicidal thoughts - 0 0  PHQ-9 Score - 0 4  Some recent data might be hidden    History [name] [name] has a past medical history of AICD (automatic cardioverter/defibrillator) present (04/05/2017), Anemia, Arthritis, Asthma, Atrial fibrillation (HCC), CAD (coronary artery disease), CHF (congestive heart failure) ([place]), Cholecystitis (11/2013), CKD (chronic kidney disease) (2015), COPD (chronic obstructive pulmonary  disease) ([place]), Esophageal reflux, Gallstones, Gastric antral vascular ectasia (2013), Gout, Hepatomegaly, Hiatal hernia, History of blood transfusion (~ 2012), Hyperlipidemia, Hypothyroidism, Other and unspecified coagulation defects, Personal history of colonic polyps (05/29/2010), and Unspecified essential hypertension.   [name] [name] has a past surgical history that includes Coronary angioplasty with stent (~ 2008; 07/08/2014); Coronary artery bypass graft (1998); Hernia repair; Umbilical hernia repair (1970's); left and right heart catheterization with coronary/graft angiogram (N/A, 07/09/2014); Colonoscopy (2013); Esophagogastroduodenoscopy (2013); Givens capsule study (2013); cholecystomy tube (12/28/2014 - 01/06/2015); Esophagogastroduodenoscopy (N/A, 12/12/2015); Cardiac catheterization (N/A, 07/15/2016); and ICD IMPLANT (N/A, 04/05/2017).   His family history includes Early death in his brother; Heart attack in his brother; Heart disease in his brother and father; Hypertension in his unknown relative; Kidney disease in his mother; Leukemia in his mother; Stomach cancer in his sister and sister.[name] [name] reports that [name] [name] quit smoking about 21 years ago. His smoking use included cigarettes. [name] [name] has a 42.00 pack-year smoking history. [name] [name] has never used smokeless tobacco. [name] [name] reports current alcohol use of about 1.0 standard drinks of alcohol per week. [name] [name] reports that [name] [name] does not use drugs.    ROS Review of Systems  Constitutional: Negative.   HENT: Negative.   Eyes: Negative for visual disturbance.  Respiratory: Negative for cough and shortness of breath.   Cardiovascular: Positive for leg swelling. Negative for chest pain.  Gastrointestinal: Negative for abdominal pain, diarrhea, nausea and vomiting.  Genitourinary: Negative for difficulty urinating.  Musculoskeletal: Negative for arthralgias and myalgias.  Skin: Negative for rash.  Neurological: Negative for headaches.  Psychiatric/Behavioral: Negative for sleep  disturbance.  Objective:  BP (!) 106/55   Pulse (!) 59   Temp (!) 97.3 F (36.3 C) (Oral)   Ht 5' 9.5" (1.765 m)   Wt 180 lb 2 oz (81.7 kg)   BMI 26.22 kg/m   BP Readings from Last 3 Encounters:  10/18/18 (!) 106/55  08/17/18 (!) 120/58  08/11/18 (!) 149/65    Wt Readings from Last 3 Encounters:  10/18/18 180 lb 2 oz (81.7 kg)  08/17/18 180 lb 9.6 oz (81.9 kg)  08/11/18 178 lb (80.7 kg)     Physical Exam Constitutional:      General: [name] [name] is not in acute distress.    Appearance: [name] [name] is well-developed.  HENT:     Head: Normocephalic and atraumatic.     Right Ear: External ear normal.     Left Ear: External ear normal.     Nose: Nose normal.  Eyes:     Conjunctiva/sclera: Conjunctivae normal.     Pupils: Pupils are equal, round, and reactive to light.  Neck:     Musculoskeletal: Normal range of motion and neck supple.  Cardiovascular:     Rate and Rhythm: Normal rate and regular rhythm.     Heart sounds: Normal heart sounds. No murmur.  Pulmonary:     Effort: Pulmonary effort is normal. No respiratory distress.     Breath sounds: Normal breath sounds. No wheezing or rales.  Abdominal:     Palpations: Abdomen is soft.     Tenderness: There is no abdominal tenderness.  Musculoskeletal: Normal range of motion.  Skin:    General: Skin is warm and dry.  Neurological:     Mental Status: [name] [name] is alert and oriented to person, place, and time.     Deep Tendon Reflexes: Reflexes are normal and symmetric.  Psychiatric:        Behavior: Behavior normal.        Thought Content: Thought content normal.        Judgment: Judgment normal.       Assessment & Plan:   [name] [name] was seen today for medical management of chronic issues.  Diagnoses and all orders for this visit:  Chronic obstructive pulmonary disease, unspecified COPD type ([place]) -     DG Chest 2 View; Future  Atrial fibrillation, chronic -     CoaguChek XS/INR Waived -     CBC with Differential/Platelet -      CMP14+EGFR  Other specified hypothyroidism -     TSH -     T4, Free  Other orders -     albuterol (PROAIR HFA) 108 (90 Base) MCG/ACT inhaler; Use 2 puffs every 6 hours  as needed for shortness of  breath -     fluticasone furoate-vilanterol (BREO ELLIPTA) 200-25 MCG/INH AEPB; Inhale 1 puff into the lungs daily. -     furosemide (LASIX) 40 MG tablet; Take 1 tablet (40 mg total) by mouth daily. -     levothyroxine (SYNTHROID, LEVOTHROID) 112 MCG tablet; Take 1 tablet (112 mcg total) by mouth daily before breakfast. -     pantoprazole (PROTONIX) 40 MG tablet; Take 1 tablet (40 mg total) by mouth 2 (two) times daily. -     sucralfate (CARAFATE) 1 g tablet; Take 1 tablet (1 g total) by mouth 4 (four) times daily -  with meals and at bedtime. With breakfast and supper. -     warfarin (COUMADIN) 5 MG tablet; TAKE 1 TABLET BY MOUTH  ('5MG'$ ) ON MONDAY,  WEDNESDAY, AND  FRIDAY AND 1/2 TABLET (2.'5MG'$ ) ALL OTHER DAYS       I am having [name] R. Mangas "[name]" maintain his ipratropium-albuterol, budesonide, multivitamins ther. w/minerals, Vitamin D3, ferrous sulfate, docusate sodium, loratadine, nitroGLYCERIN, Hypromell-Glycerin-Naphazoline, lovastatin, hydrALAZINE, spironolactone, sacubitril-valsartan, albuterol, fluticasone furoate-vilanterol, furosemide, levothyroxine, pantoprazole, sucralfate, and warfarin.  Allergies as of 10/18/2018   No Known Allergies     Medication List       Accurate as of October 18, 2018 11:59 PM. Always use your most recent med list.        albuterol 108 (90 Base) MCG/ACT inhaler Commonly known as:  PROAIR HFA Use 2 puffs every 6 hours  as needed for shortness of  breath   budesonide 0.25 MG/2ML nebulizer solution Commonly known as:  PULMICORT Take 0.25 mg by nebulization 3 (three) times daily as needed (shortness of breath).   carvedilol 3.125 MG tablet Commonly known as:  COREG TAKE 1 TABLET (3.125 MG TOTAL) BY MOUTH 2 (TWO) TIMES DAILY WITH A MEAL.     CLEAR EYES FOR DRY EYES PLUS 0.8-0.25-0.012 % Soln Generic drug:  Hypromell-Glycerin-Naphazoline Place 1-2 drops into both eyes 3 (three) times daily as needed (for dry eyes.).   docusate sodium 100 MG capsule Commonly known as:  COLACE Take 100 mg by mouth daily.   ferrous sulfate 325 (65 FE) MG tablet Take 325 mg by mouth 2 (two) times daily.   fluticasone furoate-vilanterol 200-25 MCG/INH Aepb Commonly known as:  BREO ELLIPTA Inhale 1 puff into the lungs daily.   furosemide 40 MG tablet Commonly known as:  LASIX Take 1 tablet (40 mg total) by mouth daily.   hydrALAZINE 50 MG tablet Commonly known as:  APRESOLINE TAKE 1 TABLET BY MOUTH THREE TIMES A DAY   ipratropium-albuterol 0.5-2.5 (3) MG/3ML Soln Commonly known as:  DUONEB Take 3 mLs by nebulization 3 (three) times daily as needed (for shortness of breath/wheezing.). For shortness of breath.   levothyroxine 112 MCG tablet Commonly known as:  SYNTHROID, LEVOTHROID Take 1 tablet (112 mcg total) by mouth daily before breakfast.   loratadine 10 MG tablet Commonly known as:  CLARITIN Take 10 mg by mouth daily as needed (for allergies.).   lovastatin 40 MG tablet Commonly known as:  MEVACOR TAKE 2 TABLETS BY MOUTH AT  BEDTIME   multivitamins ther. w/minerals Tabs tablet Take 1 tablet by mouth daily.   nitroGLYCERIN 0.4 MG SL tablet Commonly known as:  NITROSTAT Place 1 tablet (0.4 mg total) under the tongue every 5 (five) minutes as needed for chest pain.   pantoprazole 40 MG tablet Commonly known as:  PROTONIX Take 1 tablet (40 mg total) by mouth 2 (two) times daily.   sacubitril-valsartan 97-103 MG Commonly known as:  ENTRESTO Take 1 tablet by mouth 2 (two) times daily.   spironolactone 25 MG tablet Commonly known as:  ALDACTONE Take 0.5 tablets (12.5 mg total) by mouth daily.   sucralfate 1 g tablet Commonly known as:  CARAFATE Take 1 tablet (1 g total) by mouth 4 (four) times daily -  with meals and  at bedtime. With breakfast and supper.   Vitamin D3 125 MCG (5000 UT) Caps Take 5,000 Units by mouth daily.   warfarin 5 MG tablet Commonly known as:  COUMADIN Take as directed by the anticoagulation clinic. If you are unsure how to take this medication, talk to your nurse or doctor. Original instructions:  TAKE 1 TABLET BY MOUTH  ('5MG'$ ) ON MONDAY,  WEDNESDAY, AND FRIDAY AND 1/2  TABLET (2.'5MG'$ ) ALL OTHER DAYS        Follow-up: Return in about 6 months (around 04/18/2019) for Hypothyroidism.  Claretta Fraise, M.D.

## 2018-11-03 ENCOUNTER — Telehealth (HOSPITAL_COMMUNITY): Payer: Self-pay

## 2018-11-03 ENCOUNTER — Encounter (HOSPITAL_COMMUNITY): Payer: Self-pay | Admitting: Cardiology

## 2018-11-03 ENCOUNTER — Encounter: Payer: Self-pay | Admitting: *Deleted

## 2018-11-03 ENCOUNTER — Ambulatory Visit (HOSPITAL_COMMUNITY)
Admission: RE | Admit: 2018-11-03 | Discharge: 2018-11-03 | Disposition: A | Discharge status: Home / Self Care | Payer: Medicare Other | Source: Ambulatory Visit | Attending: Cardiology | Admitting: Cardiology

## 2018-11-03 VITALS — BP 126/60 | HR 65 | Wt 177.4 lb

## 2018-11-03 DIAGNOSIS — I2510 Atherosclerotic heart disease of native coronary artery without angina pectoris: Secondary | ICD-10-CM | POA: Insufficient documentation

## 2018-11-03 DIAGNOSIS — Z9981 Dependence on supplemental oxygen: Secondary | ICD-10-CM | POA: Diagnosis not present

## 2018-11-03 DIAGNOSIS — R001 Bradycardia, unspecified: Secondary | ICD-10-CM | POA: Insufficient documentation

## 2018-11-03 DIAGNOSIS — J449 Chronic obstructive pulmonary disease, unspecified: Secondary | ICD-10-CM | POA: Diagnosis not present

## 2018-11-03 DIAGNOSIS — I4821 Permanent atrial fibrillation: Secondary | ICD-10-CM

## 2018-11-03 DIAGNOSIS — R0600 Dyspnea, unspecified: Secondary | ICD-10-CM | POA: Diagnosis not present

## 2018-11-03 DIAGNOSIS — E039 Hypothyroidism, unspecified: Secondary | ICD-10-CM | POA: Insufficient documentation

## 2018-11-03 DIAGNOSIS — I255 Ischemic cardiomyopathy: Secondary | ICD-10-CM

## 2018-11-03 DIAGNOSIS — Z951 Presence of aortocoronary bypass graft: Secondary | ICD-10-CM | POA: Diagnosis not present

## 2018-11-03 DIAGNOSIS — N183 Chronic kidney disease, stage 3 (moderate): Secondary | ICD-10-CM | POA: Diagnosis not present

## 2018-11-03 DIAGNOSIS — Z8249 Family history of ischemic heart disease and other diseases of the circulatory system: Secondary | ICD-10-CM | POA: Insufficient documentation

## 2018-11-03 DIAGNOSIS — R0602 Shortness of breath: Secondary | ICD-10-CM | POA: Diagnosis not present

## 2018-11-03 DIAGNOSIS — I482 Chronic atrial fibrillation, unspecified: Secondary | ICD-10-CM | POA: Diagnosis not present

## 2018-11-03 DIAGNOSIS — Z87891 Personal history of nicotine dependence: Secondary | ICD-10-CM | POA: Insufficient documentation

## 2018-11-03 DIAGNOSIS — I5022 Chronic systolic (congestive) heart failure: Secondary | ICD-10-CM | POA: Insufficient documentation

## 2018-11-03 DIAGNOSIS — I130 Hypertensive heart and chronic kidney disease with heart failure and stage 1 through stage 4 chronic kidney disease, or unspecified chronic kidney disease: Secondary | ICD-10-CM | POA: Insufficient documentation

## 2018-11-03 DIAGNOSIS — E785 Hyperlipidemia, unspecified: Secondary | ICD-10-CM | POA: Diagnosis not present

## 2018-11-03 DIAGNOSIS — M109 Gout, unspecified: Secondary | ICD-10-CM | POA: Diagnosis not present

## 2018-11-03 DIAGNOSIS — Z79899 Other long term (current) drug therapy: Secondary | ICD-10-CM | POA: Insufficient documentation

## 2018-11-03 DIAGNOSIS — Z7901 Long term (current) use of anticoagulants: Secondary | ICD-10-CM | POA: Insufficient documentation

## 2018-11-03 LAB — BASIC METABOLIC PANEL
Anion gap: 11 (ref 5–15)
BUN: 31 mg/dL — ABNORMAL HIGH (ref 8–23)
CO2: 23 mmol/L (ref 22–32)
CREATININE: 1.82 mg/dL — AB (ref 0.61–1.24)
Calcium: 9.4 mg/dL (ref 8.9–10.3)
Chloride: 109 mmol/L (ref 98–111)
GFR calc Af Amer: 40 mL/min — ABNORMAL LOW (ref >60)
GFR, EST NON AFRICAN AMERICAN: 35 mL/min — AB (ref >60)
GLUCOSE: 100 mg/dL — AB (ref 70–99)
POTASSIUM: 4.3 mmol/L (ref 3.5–5.1)
SODIUM: 143 mmol/L (ref 135–145)

## 2018-11-03 MED ORDER — CARVEDILOL 3.125 MG PO TABS: 3.1250 mg | ORAL_TABLET | Freq: Two times a day (BID) | ORAL | 6 refills | Status: DC

## 2018-11-03 MED ORDER — FUROSEMIDE 40 MG PO TABS: 20.0000 mg | ORAL_TABLET | Freq: Every day | ORAL | 1 refills | Status: DC

## 2018-11-03 NOTE — Addendum Note (Signed)
Addended by: Jovita Kussmaul on: 11/03/2018 04:48 PM   Modules accepted: Orders

## 2018-11-03 NOTE — Telephone Encounter (Signed)
-----   Message from Larey Dresser, MD sent at 11/03/2018  3:34 PM EST ----- Decrease Lasix to 20 mg daily.

## 2018-11-03 NOTE — Patient Instructions (Signed)
Labs today We will only contact you if something comes back abnormal or we need to make some changes. Otherwise no news is good news!  Your physician request that you have a Pulmonary Function Test. You have been scheduled today.  Your physician recommends that you schedule a follow-up appointment in: 4 months with Dr. Aundra Dubin

## 2018-11-03 NOTE — Telephone Encounter (Signed)
Spoke with pt and discussed lab results, pt verbalized understanding and agreed to decrease lasix to 20mg  daily.

## 2018-11-05 NOTE — Progress Notes (Signed)
PCP: Dr. Livia Snellen Cardiology: Dr. Tamala Julian HF Cardiology: Dr. Aundra Dubin  78 y.o. with chronic atrial fibrillation, CAD s/p CABG, and chronic systolic CHF presents for CHF clinic followup.  Patient had CABG in 1998.  He has had chronic atrial fibrillation long-term.  Last echo in 6/17 showed EF 25-30%.  Most recent cardiac cath was in 10/17.  This showed occluded native vessels with occluded SVG-dLAD and SVG-RCA.  SVG-OM and LIMA-LAD were patent (LIMA had stable 85% ostial stenosis).  No interventional options, medically managed.  Cardiac output was preserved, filling pressures were mildly elevated.    Echo in 6/18 showed EF 30-35%.  Patient had [place] ICD placed in 7/18 with His bundle lead. Most recent echo in 11/19 showed EF up to 55-60%.     He presents today for followup of CHF and CAD.  He continues to have dyspnea after walking 30 yards or so.  This is chronic.  He can ride an exercise bike for 20-30 minutes then gets short of breath.  No chest pain.  He can play golf but has to ride a cart.   Labs (10/17): hgb 10.2, K 4.1, creatinine 1.3 Labs (11/17): K 4.3, creatinine 1.5, BNP 638, LDL 87 Labs (2/18): K 4.1, creatinine 1.16, BNP 589 Labs (3/18): BNP 947, K 4.3, creatinine 1.09 Labs (10/19): LDL 49 Labs (1/20): K 4.5, creatinine 1.78, hgb 11.7  [employer] device interrogation: Heartlogic score = 2, His paced 93% of the time.   PMH: 1. Gout 2. Hyperlipidemia 3. Lower GI bleed: [place] AVMs. 4. Atrial fibrillation: Chronic 5. HTN 6. 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 CAD: CABG x 4 in 1998. - LHC (10/17) with total occlusion SVG-dLAD, total occlusion SVG-RCA (new), patent SVG-OM, 85% ostial LIMA-LAD (unchanged), total occlusion of native LAD, LCx, and RCA.  No interventional option.  11. Chronic systolic CHF: Ischemic cardiomyopathy.   - Echo (6/17) with EF 25-30%, mild MR, mildly dilated RV with  mildly decreased systolic function.   - RHC (10/17): mean RA 11, PA 49/14, mean PCWP 17, CI 3.84.  - Echo (6/18): EF 30-35%.  - [place] ICD with His bundle lead 7/18.  - Echo (11/19): EF 55-60%, mild asymmetric septal hypertrophy.  12. Sinus bradycardia 13. Carotid disease: 11/19 carotid dopplers with 60-79% BICA stenosis.   SH: Married, quit smoking 1998, retired from [employer], lives in [place].    Family History  Problem Relation Age of Onset  . Leukemia Mother   . Kidney disease Mother        kidney removed   . Heart attack Brother   . Heart disease Brother   . Heart disease Father   . Stomach cancer Sister   . Stomach cancer Sister   . Early death Brother   . Hypertension Unknown        family   . Colon cancer Neg Hx    Review of systems complete and found to be negative unless listed in HPI.    Current Outpatient Medications  Medication Sig Dispense Refill  . albuterol (PROAIR HFA) 108 (90 Base) MCG/ACT inhaler Use 2 puffs every 6 hours  as needed for shortness of  breath 34 g 3  . budesonide (PULMICORT) 0.25 MG/2ML nebulizer solution Take 0.25 mg by nebulization 3 (three) times daily as needed (shortness of breath).     . carvedilol (COREG) 3.125 MG tablet Take 1 tablet (3.125 mg total) by mouth  2 (two) times daily with a meal. 60 tablet 6  . Cholecalciferol (VITAMIN D3) 5000 UNITS CAPS Take 5,000 Units by mouth daily.     Marland Kitchen docusate sodium (COLACE) 100 MG capsule Take 100 mg by mouth daily.     . ferrous sulfate 325 (65 FE) MG tablet Take 325 mg by mouth 2 (two) times daily.    . hydrALAZINE (APRESOLINE) 50 MG tablet TAKE 1 TABLET BY MOUTH THREE TIMES A DAY 270 tablet 2  . Hypromell-Glycerin-Naphazoline (CLEAR EYES FOR DRY EYES PLUS) 0.8-0.25-0.012 % SOLN Place 1-2 drops into both eyes 3 (three) times daily as needed (for dry eyes.).    Marland Kitchen ipratropium-albuterol (DUONEB) 0.5-2.5 (3) MG/3ML SOLN Take 3 mLs by nebulization 3 (three) times daily as needed  (for shortness of breath/wheezing.). For shortness of breath.     . levothyroxine (SYNTHROID, LEVOTHROID) 112 MCG tablet Take 1 tablet (112 mcg total) by mouth daily before breakfast. 90 tablet 1  . loratadine (CLARITIN) 10 MG tablet Take 10 mg by mouth daily as needed (for allergies.).     Marland Kitchen lovastatin (MEVACOR) 40 MG tablet TAKE 2 TABLETS BY MOUTH AT  BEDTIME 180 tablet 3  . Multiple Vitamins-Minerals (MULTIVITAMINS THER. W/MINERALS) TABS Take 1 tablet by mouth daily.      . pantoprazole (PROTONIX) 40 MG tablet Take 1 tablet (40 mg total) by mouth 2 (two) times daily. 180 tablet 1  . sacubitril-valsartan (ENTRESTO) 97-103 MG Take 1 tablet by mouth 2 (two) times daily. 60 tablet 5  . spironolactone (ALDACTONE) 25 MG tablet Take 0.5 tablets (12.5 mg total) by mouth daily. 15 tablet 1  . sucralfate (CARAFATE) 1 g tablet Take 1 tablet (1 g total) by mouth 4 (four) times daily -  with meals and at bedtime. With breakfast and supper. 360 tablet 1  . warfarin (COUMADIN) 5 MG tablet TAKE 1 TABLET BY MOUTH  (5MG ) ON MONDAY,  WEDNESDAY, AND FRIDAY AND 1/2 TABLET (2.5MG ) ALL OTHER DAYS 90 tablet 0  . fluticasone furoate-vilanterol (BREO ELLIPTA) 200-25 MCG/INH AEPB Inhale 1 puff into the lungs daily. (Patient not taking: Reported on 11/03/2018) 28 each 2  . furosemide (LASIX) 40 MG tablet Take 0.5 tablets (20 mg total) by mouth daily. 90 tablet 1  . nitroGLYCERIN (NITROSTAT) 0.4 MG SL tablet Place 1 tablet (0.4 mg total) under the tongue every 5 (five) minutes as needed for chest pain. (Patient not taking: Reported on 11/03/2018) 6 tablet 1   No current facility-administered medications for this encounter.    BP 126/60   Pulse 65   Wt 80.5 kg (177 lb 6.4 oz)   SpO2 93%   BMI 25.82 kg/m    Wt Readings from Last 3 Encounters:  11/03/18 80.5 kg (177 lb 6.4 oz)  10/18/18 81.7 kg (180 lb 2 oz)  08/17/18 81.9 kg (180 lb 9.6 oz)    General: NAD Neck: No JVD, no thyromegaly or thyroid nodule.  Lungs:  Distant BS CV: Nondisplaced PMI.  Heart irregular S1/S2, no S3/S4, no murmur.  No peripheral edema.  No carotid bruit.  Normal pedal pulses.  Abdomen: Soft, nontender, no hepatosplenomegaly, no distention.  Skin: Intact without lesions or rashes.  Neurologic: Alert and oriented x 3.  Psych: Normal affect. Extremities: No clubbing or cyanosis.  HEENT: Normal.   Assessment/Plan: 1. CAD: s/p CABG 1998.  Occluded native vessels, patent SVG-OM and LIMA-LAD (chronic 85% ostial LIMA stenosis).  No chest pain.  No good interventional options.  - No ASA  given stable CAD and use of warfarin.    - Continue statin, LDL acceptable at last check.  2. Chronic systolic CHF: Ischemic cardiomyopathy.  Echo (6/17) with EF 25-30%, mildly dilated RV with decreased RV systolic function. [place] with His bundle lead placed, 93% His pacing today.  Echo in 11/19 with EF up to 55-60%. NYHA class III symptoms, chronic.  Not volume overloaded by exam or Heartlogic.  Dyspnea well out of proportion to cardiac exam/findings.  ?COPD as major cause of dyspnea, he quit smoking in 1998.  - Continue Coreg 3.125 mg bid.  - Continue Entresto 97/103 bid.  - Continue spironolactone 12.5 daily, hold off on increase with EF back to normal range.  - Continue Lasix 40 mg daily.   - I will arrange for PFTs to see if he has significant COPD.  3. Atrial fibrillation: Chronic.  Unlikely to cardiovert with chronicity.  - Continue coumadin. 4. CKD stage 3:  - BMET today.   5. COPD: As above, this may be the main cause of his dyspnea. Will get PFTs.    Loralie Champagne 11/05/2018

## 2018-11-17 ENCOUNTER — Ambulatory Visit (HOSPITAL_COMMUNITY)
Admission: RE | Admit: 2018-11-17 | Discharge: 2018-11-17 | Disposition: A | Discharge status: Home / Self Care | Payer: Medicare Other | Source: Ambulatory Visit | Attending: Cardiology | Admitting: Cardiology

## 2018-11-17 DIAGNOSIS — I5022 Chronic systolic (congestive) heart failure: Secondary | ICD-10-CM | POA: Diagnosis not present

## 2018-11-17 LAB — PULMONARY FUNCTION TEST
DL/VA % pred: 64 %
DL/VA: 2.53 ml/min/mmHg/L
DLCO UNC % PRED: 43 %
DLCO UNC: 10.44 ml/min/mmHg
FEF 25-75 Post: 0.7 L/sec
FEF 25-75 Pre: 0.48 L/sec
FEF2575-%Change-Post: 45 %
FEF2575-%PRED-PRE: 24 %
FEF2575-%Pred-Post: 35 %
FEV1-%Change-Post: 15 %
FEV1-%PRED-POST: 51 %
FEV1-%Pred-Pre: 44 %
FEV1-Post: 1.47 L
FEV1-Pre: 1.27 L
FEV1FVC-%CHANGE-POST: 3 %
FEV1FVC-%Pred-Pre: 70 %
FEV6-%Change-Post: 10 %
FEV6-%PRED-PRE: 61 %
FEV6-%Pred-Post: 67 %
FEV6-POST: 2.52 L
FEV6-Pre: 2.27 L
FEV6FVC-%Change-Post: -1 %
FEV6FVC-%Pred-Post: 96 %
FEV6FVC-%Pred-Pre: 97 %
FVC-%Change-Post: 11 %
FVC-%Pred-Post: 70 %
FVC-%Pred-Pre: 62 %
FVC-Post: 2.79 L
FVC-Pre: 2.5 L
POST FEV6/FVC RATIO: 90 %
PRE FEV1/FVC RATIO: 51 %
Post FEV1/FVC ratio: 53 %
Pre FEV6/FVC Ratio: 91 %
RV % pred: 190 %
RV: 4.97 L
TLC % pred: 106 %
TLC: 7.4 L

## 2018-11-17 MED ORDER — ALBUTEROL SULFATE (2.5 MG/3ML) 0.083% IN NEBU
2.5000 mg | INHALATION_SOLUTION | Freq: Once | RESPIRATORY_TRACT | Status: AC
  Administered 2018-11-17: 2.5 mg via RESPIRATORY_TRACT

## 2018-11-24 ENCOUNTER — Other Ambulatory Visit (HOSPITAL_COMMUNITY): Payer: Self-pay | Admitting: Cardiology

## 2018-11-28 DIAGNOSIS — J449 Chronic obstructive pulmonary disease, unspecified: Secondary | ICD-10-CM | POA: Diagnosis not present

## 2018-11-29 DIAGNOSIS — J449 Chronic obstructive pulmonary disease, unspecified: Secondary | ICD-10-CM | POA: Diagnosis not present

## 2018-12-04 ENCOUNTER — Ambulatory Visit: Payer: Medicare Other | Admitting: Family Medicine

## 2018-12-17 ENCOUNTER — Other Ambulatory Visit (HOSPITAL_COMMUNITY): Payer: Self-pay | Admitting: Cardiology

## 2018-12-27 ENCOUNTER — Other Ambulatory Visit: Payer: Self-pay | Admitting: Family Medicine

## 2019-01-04 ENCOUNTER — Ambulatory Visit (INDEPENDENT_AMBULATORY_CARE_PROVIDER_SITE_OTHER): Payer: Medicare Other | Admitting: *Deleted

## 2019-01-04 ENCOUNTER — Other Ambulatory Visit: Payer: Self-pay

## 2019-01-04 DIAGNOSIS — I255 Ischemic cardiomyopathy: Secondary | ICD-10-CM | POA: Diagnosis not present

## 2019-01-05 ENCOUNTER — Telehealth: Payer: Self-pay

## 2019-01-05 LAB — CUP PACEART REMOTE DEVICE CHECK
Battery Remaining Longevity: 90 mo
Battery Remaining Percentage: 84 %
Brady Statistic RA Percent Paced: 93 %
Brady Statistic RV Percent Paced: 1 %
Date Time Interrogation Session: 20200403134400
HighPow Impedance: 77 Ohm
Implantable Lead Implant Date: 20180703
Implantable Lead Implant Date: 20180703
Implantable Lead Location: 753859
Implantable Lead Location: 753860
Implantable Lead Model: 293
Implantable Lead Model: 3830
Implantable Lead Serial Number: 433180
Implantable Pulse Generator Implant Date: 20180703
Lead Channel Impedance Value: 439 Ohm
Lead Channel Impedance Value: 846 Ohm
Lead Channel Setting Pacing Amplitude: 2.5 V
Lead Channel Setting Pacing Amplitude: 3.5 V
Lead Channel Setting Pacing Pulse Width: 0.4 ms
Lead Channel Setting Sensing Sensitivity: 0.5 mV
Pulse Gen Serial Number: 533886

## 2019-01-05 NOTE — Telephone Encounter (Signed)
Spoke with patient to remind of missed remote transmission 

## 2019-01-08 ENCOUNTER — Other Ambulatory Visit: Payer: Self-pay | Admitting: Family Medicine

## 2019-01-10 DIAGNOSIS — J449 Chronic obstructive pulmonary disease, unspecified: Secondary | ICD-10-CM | POA: Diagnosis not present

## 2019-01-11 ENCOUNTER — Encounter: Payer: Self-pay | Admitting: Cardiology

## 2019-01-11 NOTE — Progress Notes (Signed)
Remote ICD transmission.   

## 2019-01-22 ENCOUNTER — Ambulatory Visit: Payer: Medicare Other | Admitting: Family Medicine

## 2019-01-27 DIAGNOSIS — J449 Chronic obstructive pulmonary disease, unspecified: Secondary | ICD-10-CM | POA: Diagnosis not present

## 2019-01-31 ENCOUNTER — Telehealth: Payer: Self-pay | Admitting: Family Medicine

## 2019-01-31 NOTE — Telephone Encounter (Signed)
Apt scheduled.  

## 2019-02-01 ENCOUNTER — Encounter: Payer: Self-pay | Admitting: Family Medicine

## 2019-02-01 ENCOUNTER — Other Ambulatory Visit: Payer: Self-pay

## 2019-02-01 ENCOUNTER — Ambulatory Visit (INDEPENDENT_AMBULATORY_CARE_PROVIDER_SITE_OTHER): Payer: Medicare Other | Admitting: Family Medicine

## 2019-02-01 VITALS — BP 131/57 | HR 60 | Temp 97.1°F | Ht 69.0 in | Wt 167.0 lb

## 2019-02-01 DIAGNOSIS — I482 Chronic atrial fibrillation, unspecified: Secondary | ICD-10-CM

## 2019-02-01 DIAGNOSIS — D500 Iron deficiency anemia secondary to blood loss (chronic): Secondary | ICD-10-CM

## 2019-02-01 DIAGNOSIS — E782 Mixed hyperlipidemia: Secondary | ICD-10-CM | POA: Diagnosis not present

## 2019-02-01 DIAGNOSIS — I10 Essential (primary) hypertension: Secondary | ICD-10-CM | POA: Diagnosis not present

## 2019-02-01 DIAGNOSIS — Z125 Encounter for screening for malignant neoplasm of prostate: Secondary | ICD-10-CM

## 2019-02-01 LAB — COAGUCHEK XS/INR WAIVED
INR: 1.8 — ABNORMAL HIGH (ref 0.9–1.1)
Prothrombin Time: 21.8 s

## 2019-02-02 ENCOUNTER — Other Ambulatory Visit: Payer: Self-pay | Admitting: Family Medicine

## 2019-02-02 LAB — CMP14+EGFR
ALT: 12 IU/L (ref 0–44)
AST: 19 IU/L (ref 0–40)
Albumin/Globulin Ratio: 2.2 (ref 1.2–2.2)
Albumin: 4.3 g/dL (ref 3.7–4.7)
Alkaline Phosphatase: 51 IU/L (ref 39–117)
BUN/Creatinine Ratio: 16 (ref 10–24)
BUN: 31 mg/dL — ABNORMAL HIGH (ref 8–27)
Bilirubin Total: 0.6 mg/dL (ref 0.0–1.2)
CO2: 23 mmol/L (ref 20–29)
Calcium: 9.4 mg/dL (ref 8.6–10.2)
Chloride: 103 mmol/L (ref 96–106)
Creatinine, Ser: 1.88 mg/dL — ABNORMAL HIGH (ref 0.76–1.27)
GFR calc Af Amer: 38 mL/min/{1.73_m2} — ABNORMAL LOW (ref >59)
GFR calc non Af Amer: 33 mL/min/{1.73_m2} — ABNORMAL LOW (ref >59)
Globulin, Total: 2 g/dL (ref 1.5–4.5)
Glucose: 85 mg/dL (ref 65–99)
Potassium: 5.1 mmol/L (ref 3.5–5.2)
Sodium: 142 mmol/L (ref 134–144)
Total Protein: 6.3 g/dL (ref 6.0–8.5)

## 2019-02-02 LAB — CBC WITH DIFFERENTIAL/PLATELET
Basophils Absolute: 0.1 10*3/uL (ref 0.0–0.2)
Basos: 1 %
EOS (ABSOLUTE): 0.3 10*3/uL (ref 0.0–0.4)
Eos: 3 %
Hematocrit: 34.5 % — ABNORMAL LOW (ref 37.5–51.0)
Hemoglobin: 11.5 g/dL — ABNORMAL LOW (ref 13.0–17.7)
Immature Grans (Abs): 0 10*3/uL (ref 0.0–0.1)
Immature Granulocytes: 0 %
Lymphocytes Absolute: 1.2 10*3/uL (ref 0.7–3.1)
Lymphs: 12 %
MCH: 33.1 pg — ABNORMAL HIGH (ref 26.6–33.0)
MCHC: 33.3 g/dL (ref 31.5–35.7)
MCV: 99 fL — ABNORMAL HIGH (ref 79–97)
Monocytes Absolute: 0.9 10*3/uL (ref 0.1–0.9)
Monocytes: 9 %
Neutrophils Absolute: 7.3 10*3/uL — ABNORMAL HIGH (ref 1.4–7.0)
Neutrophils: 75 %
Platelets: 267 10*3/uL (ref 150–450)
RBC: 3.47 x10E6/uL — ABNORMAL LOW (ref 4.14–5.80)
RDW: 12.1 % (ref 11.6–15.4)
WBC: 9.7 10*3/uL (ref 3.4–10.8)

## 2019-02-02 LAB — LIPID PANEL
Chol/HDL Ratio: 3.2 ratio (ref 0.0–5.0)
Cholesterol, Total: 126 mg/dL (ref 100–199)
HDL: 40 mg/dL (ref >39)
LDL Calculated: 65 mg/dL (ref 0–99)
Triglycerides: 107 mg/dL (ref 0–149)
VLDL Cholesterol Cal: 21 mg/dL (ref 5–40)

## 2019-02-02 LAB — PSA, TOTAL AND FREE
PSA, Free Pct: 46.7 %
PSA, Free: 0.28 ng/mL
Prostate Specific Ag, Serum: 0.6 ng/mL (ref 0.0–4.0)

## 2019-02-02 LAB — TSH: TSH: 0.83 u[IU]/mL (ref 0.450–4.500)

## 2019-02-04 ENCOUNTER — Encounter: Payer: Self-pay | Admitting: Family Medicine

## 2019-02-04 NOTE — Progress Notes (Signed)
Subjective:  Patient ID: [name] [name], male    DOB: [birth year]  Age: 78 y.o. MRN: [id number]  CC: Recheck protime and labs   HPI [name] [name] presents for Patient in for follow-up of atrial fibrillation. Patient denies any recent bouts of chest pain or palpitations. Additionally, patient is taking anticoagulants. Patient denies any recent excessive bleeding episodes including epistaxis, bleeding from the gums, genitalia, rectal bleeding or hematuria. Additionally there has been no excessive bruising. Patient in for follow-up of elevated cholesterol. Doing well without complaints on current medication. Denies side effects of statin including myalgia and arthralgia and nausea. Also in today for liver function testing. Currently no chest pain, shortness of breath or other cardiovascular related symptoms noted.  Follow-up of hypertension. Patient has no history of headache chest pain or shortness of breath or recent cough. Patient also denies symptoms of TIA such as numbness weakness lateralizing. Patient checks  blood pressure at home and has not had any elevated readings recently. Patient denies side effects from his medication. States taking it regularly.   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   He has a past surgical history that includes Coronary angioplasty with stent (~ 2008; 07/08/2014); Coronary artery bypass graft (1998); Hernia repair; Umbilical hernia repair (1970's); left and right heart catheterization with coronary/graft angiogram (N/A, 07/09/2014); Colonoscopy (2013); Esophagogastroduodenoscopy (2013); Givens capsule study (2013); cholecystomy tube (12/28/2014 - 01/06/2015); Esophagogastroduodenoscopy (N/A, 12/12/2015); Cardiac catheterization (N/A, 07/15/2016); and ICD IMPLANT (N/A, 04/05/2017).   His family history includes Early death in his brother; Heart attack in his brother; Heart disease in his brother and father; Hypertension in his unknown relative; Kidney disease in his mother; Leukemia in his mother; Stomach cancer in his sister and sister.He reports that he quit smoking about 22 years ago. His smoking use included cigarettes. He has a 42.00 pack-year smoking history. He has never used smokeless tobacco. He reports current alcohol use of about 1.0 standard drinks of alcohol per week. He reports that he does not use drugs.    ROS Review of Systems  Constitutional: Negative.   HENT: Negative.   Eyes: Negative for visual disturbance.  Respiratory: Negative for cough and shortness of breath.   Cardiovascular: Negative for chest pain and leg swelling.  Gastrointestinal: Negative for abdominal pain, diarrhea, nausea and vomiting.  Genitourinary: Negative for difficulty urinating.  Musculoskeletal: Negative for arthralgias and myalgias.  Skin: Negative for rash.  Neurological: Negative for headaches.  Psychiatric/Behavioral: Negative for sleep  disturbance.    Objective:  BP (!) 131/57  Pulse 60   Temp (!) 97.1 F (36.2 C) (Oral)   Ht _0  (1.753 m)   Wt 167 lb (75.8 kg)   BMI 24.66 kg/m   BP Readings from Last 3 Encounters:  02/01/19 (!) 131/57  11/03/18 126/60  10/18/18 (!) 106/55    Wt Readings from Last 3 Encounters:  02/01/19 167 lb (75.8 kg)  11/03/18 177 lb 6.4 oz (80.5 kg)  10/18/18 180 lb 2 oz (81.7 kg)     Physical Exam Constitutional:      General: He is not in acute distress.    Appearance: He is well-developed.  HENT:     Head: Normocephalic and atraumatic.     Right Ear: External ear normal.     Left Ear: External ear normal.     Nose: Nose normal.  Eyes:     Conjunctiva/sclera: Conjunctivae normal.     Pupils: Pupils are equal, round, and reactive to light.  Neck:     Musculoskeletal: Normal range of motion and neck supple.  Cardiovascular:     Rate and Rhythm: Normal rate and regular rhythm.     Heart sounds: Normal heart sounds. No murmur.  Pulmonary:     Effort: Pulmonary effort is normal. No respiratory distress.     Breath sounds: Normal breath sounds. No wheezing or rales.  Abdominal:     Palpations: Abdomen is soft.     Tenderness: There is no abdominal tenderness.  Musculoskeletal: Normal range of motion.  Skin:    General: Skin is warm and dry.  Neurological:     Mental Status: He is alert and oriented to person, place, and time.     Deep Tendon Reflexes: Reflexes are normal and symmetric.  Psychiatric:        Behavior: Behavior normal.        Thought Content: Thought content normal.        Judgment: Judgment normal.       Assessment & Plan:   [name] [name] was seen today for recheck protime and labs.  Diagnoses and all orders for this visit:  Atrial fibrillation, chronic -     CoaguChek XS/INR Waived  Mixed hyperlipidemia -     Lipid panel  Iron deficiency anemia due to chronic blood loss -     CBC with Differential/Platelet -     TSH  Essential  hypertension -     CMP14+EGFR  Prostate cancer screening -     PSA, total and free       I am having [name] [name] "[name] [name]" maintain his multivitamins ther. w/minerals, Vitamin D3, ferrous sulfate, docusate sodium, loratadine, nitroGLYCERIN, Hypromell-Glycerin-Naphazoline, lovastatin, hydrALAZINE, sacubitril-valsartan, albuterol, fluticasone furoate-vilanterol, pantoprazole, sucralfate, carvedilol, furosemide, spironolactone, warfarin, budesonide, and ipratropium-albuterol.  Allergies as of 02/01/2019   No Known Allergies     Medication List       Accurate as of February 01, 2019 11:59 PM. Always use your most recent med list.        albuterol 108 (90 Base) MCG/ACT inhaler Commonly known as:  ProAir HFA Use 2 puffs every 6 hours  as needed for shortness of  breath   budesonide 0.25 MG/2ML nebulizer solution Commonly known as:  PULMICORT USE 1 VIAL  IN  NEBULIZER TWICE  DAILY - rinse mouth after treatment   carvedilol 3.125 MG tablet Commonly known as:  COREG Take 1 tablet (3.125 mg total) by mouth 2 (two) times daily with a meal.   Clear Eyes for Dry Eyes Plus 0.8-0.25-0.012 %  Soln Generic drug:  Hypromell-Glycerin-Naphazoline Place 1-2 drops into both eyes 3 (three) times daily as needed (for dry eyes.).   docusate sodium 100 MG capsule Commonly known as:  COLACE Take 100 mg by mouth daily.   ferrous sulfate 325 (65 FE) MG tablet Take 325 mg by mouth 2 (two) times daily.   fluticasone furoate-vilanterol 200-25 MCG/INH Aepb Commonly known as:  BREO ELLIPTA Inhale 1 puff into the lungs daily.   furosemide 40 MG tablet Commonly known as:  LASIX Take 0.5 tablets (20 mg total) by mouth daily.   hydrALAZINE 50 MG tablet Commonly known as:  APRESOLINE TAKE 1 TABLET BY MOUTH THREE TIMES A DAY   ipratropium-albuterol 0.5-2.5 (3) MG/3ML Soln Commonly known as:  DUONEB USE 1 VIAL IN NEBULIZER 4 TIMES DAILY   levothyroxine 112 MCG tablet Commonly known as:   SYNTHROID Take 1 tablet (112 mcg total) by mouth daily before breakfast.   loratadine 10 MG tablet Commonly known as:  CLARITIN Take 10 mg by mouth daily as needed (for allergies.).   lovastatin 40 MG tablet Commonly known as:  MEVACOR TAKE 2 TABLETS BY MOUTH AT  BEDTIME   multivitamins ther. w/minerals Tabs tablet Take 1 tablet by mouth daily.   nitroGLYCERIN 0.4 MG SL tablet Commonly known as:  NITROSTAT Place 1 tablet (0.4 mg total) under the tongue every 5 (five) minutes as needed for chest pain.   pantoprazole 40 MG tablet Commonly known as:  PROTONIX Take 1 tablet (40 mg total) by mouth 2 (two) times daily.   sacubitril-valsartan 97-103 MG Commonly known as:  Entresto Take 1 tablet by mouth 2 (two) times daily.   spironolactone 25 MG tablet Commonly known as:  ALDACTONE TAKE 1/2 TABLET BY MOUTH EVERY DAY   sucralfate 1 g tablet Commonly known as:  CARAFATE Take 1 tablet (1 g total) by mouth 4 (four) times daily -  with meals and at bedtime. With breakfast and supper.   Vitamin D3 125 MCG (5000 UT) Caps Take 5,000 Units by mouth daily.   warfarin 5 MG tablet Commonly known as:  COUMADIN Take as directed by the anticoagulation clinic. If you are unsure how to take this medication, talk to your nurse or doctor. Original instructions:  TAKE 1 TABLET BY MOUTH ON  MONDAY WEDNESDAY AND FRIDAY AND 1/2 TABLET ALL OTHER  DAYS        Follow-up: Return in about 3 months (around 05/03/2019).  Claretta Fraise, M.D.

## 2019-02-07 DIAGNOSIS — J449 Chronic obstructive pulmonary disease, unspecified: Secondary | ICD-10-CM | POA: Diagnosis not present

## 2019-02-09 ENCOUNTER — Encounter (HOSPITAL_COMMUNITY): Payer: Medicare Other

## 2019-02-09 ENCOUNTER — Ambulatory Visit: Payer: Medicare Other | Admitting: Family

## 2019-02-15 ENCOUNTER — Other Ambulatory Visit: Payer: Self-pay | Admitting: Family Medicine

## 2019-02-15 ENCOUNTER — Other Ambulatory Visit (HOSPITAL_COMMUNITY): Payer: Self-pay | Admitting: Cardiology

## 2019-02-22 ENCOUNTER — Other Ambulatory Visit: Payer: Self-pay

## 2019-02-23 ENCOUNTER — Ambulatory Visit (INDEPENDENT_AMBULATORY_CARE_PROVIDER_SITE_OTHER): Payer: Medicare Other | Admitting: Family Medicine

## 2019-02-23 ENCOUNTER — Encounter: Payer: Self-pay | Admitting: Family Medicine

## 2019-02-23 DIAGNOSIS — I482 Chronic atrial fibrillation, unspecified: Secondary | ICD-10-CM | POA: Diagnosis not present

## 2019-02-23 LAB — COAGUCHEK XS/INR WAIVED
INR: 1.7 — ABNORMAL HIGH (ref 0.9–1.1)
Prothrombin Time: 20 s

## 2019-02-23 NOTE — Progress Notes (Signed)
No chief complaint on file.   HPI  Patient presents today for recheck INR. Denies excess bleeding. Doing well. No dyspnea. No chest pain.  PMH: Smoking status noted ROS: Per HPI  Objective: There were no vitals taken for this visit. Gen: NAD, alert, requested to not be examined HEENT: NCAT, Neuro: Alert and oriented, No gross deficits  Assessment and plan:  1. Atrial fibrillation, chronic     Continue Coumadin as is    Follow up 1 month Claretta Fraise, MD

## 2019-02-23 NOTE — Addendum Note (Signed)
Addended by: Liliane Bade on: 02/23/2019 08:55 AM   Modules accepted: Orders

## 2019-02-26 DIAGNOSIS — J449 Chronic obstructive pulmonary disease, unspecified: Secondary | ICD-10-CM | POA: Diagnosis not present

## 2019-03-05 DIAGNOSIS — J449 Chronic obstructive pulmonary disease, unspecified: Secondary | ICD-10-CM | POA: Diagnosis not present

## 2019-03-05 DIAGNOSIS — J45998 Other asthma: Secondary | ICD-10-CM | POA: Diagnosis not present

## 2019-03-06 ENCOUNTER — Encounter (HOSPITAL_COMMUNITY): Payer: Medicare Other | Admitting: Cardiology

## 2019-03-12 ENCOUNTER — Other Ambulatory Visit (HOSPITAL_COMMUNITY): Payer: Self-pay | Admitting: Cardiology

## 2019-03-29 DIAGNOSIS — J449 Chronic obstructive pulmonary disease, unspecified: Secondary | ICD-10-CM | POA: Diagnosis not present

## 2019-04-05 ENCOUNTER — Telehealth: Payer: Self-pay

## 2019-04-05 ENCOUNTER — Ambulatory Visit (INDEPENDENT_AMBULATORY_CARE_PROVIDER_SITE_OTHER): Payer: Medicare Other | Admitting: *Deleted

## 2019-04-05 ENCOUNTER — Telehealth: Payer: Self-pay | Admitting: *Deleted

## 2019-04-05 DIAGNOSIS — I255 Ischemic cardiomyopathy: Secondary | ICD-10-CM | POA: Diagnosis not present

## 2019-04-05 NOTE — Telephone Encounter (Signed)
Patient called in about a transmission that was not received automatically today. Assisted pt with sending a manual transmission, had to hang up due to land line connection. Advised I will call back if transmission is not received.

## 2019-04-05 NOTE — Telephone Encounter (Signed)
Spoke with patient as transmission was not received. Unable to successfully troubleshoot monitor. Gave [employer] phone number, pt agrees to call for further assistance.

## 2019-04-05 NOTE — Telephone Encounter (Signed)
Left message for patient to remind of missed remote transmission.  

## 2019-04-09 LAB — CUP PACEART REMOTE DEVICE CHECK
Battery Remaining Longevity: 84 mo
Battery Remaining Percentage: 78 %
Brady Statistic RA Percent Paced: 94 %
Brady Statistic RV Percent Paced: 1 %
Date Time Interrogation Session: 20200702174200
HighPow Impedance: 61 Ohm
Implantable Lead Implant Date: 20180703
Implantable Lead Implant Date: 20180703
Implantable Lead Location: 753859
Implantable Lead Location: 753860
Implantable Lead Model: 293
Implantable Lead Model: 3830
Implantable Lead Serial Number: 433180
Implantable Pulse Generator Implant Date: 20180703
Lead Channel Impedance Value: 406 Ohm
Lead Channel Impedance Value: 817 Ohm
Lead Channel Setting Pacing Amplitude: 2.5 V
Lead Channel Setting Pacing Amplitude: 3.5 V
Lead Channel Setting Pacing Pulse Width: 0.4 ms
Lead Channel Setting Sensing Sensitivity: 0.5 mV
Pulse Gen Serial Number: 533886

## 2019-04-09 NOTE — Telephone Encounter (Signed)
Transmission received 04-09-2019 

## 2019-04-09 NOTE — Telephone Encounter (Signed)
7/2 - Normal remote reviewed and processed.  Will let 7/6 be processed similarly and follow as needed.   Legrand Como [address]" [place], PA-C 04/09/2019 12:04 PM

## 2019-04-11 ENCOUNTER — Ambulatory Visit (INDEPENDENT_AMBULATORY_CARE_PROVIDER_SITE_OTHER): Payer: Medicare Other | Admitting: *Deleted

## 2019-04-11 DIAGNOSIS — Z0000 Encounter for general adult medical examination without abnormal findings: Secondary | ICD-10-CM | POA: Diagnosis not present

## 2019-04-11 NOTE — Patient Instructions (Signed)
Preventive Care 75 Years and Older, Male Preventive care refers to lifestyle choices and visits with your health care provider that can promote health and wellness. This includes:  A yearly physical exam. This is also called an annual well check.  Regular dental and eye exams.  Immunizations.  Screening for certain conditions.  Healthy lifestyle choices, such as diet and exercise. What can I expect for my preventive care visit? Physical exam Your health care provider will check:  Height and weight. These may be used to calculate body mass index (BMI), which is a measurement that tells if you are at a healthy weight.  Heart rate and blood pressure.  Your skin for abnormal spots. Counseling Your health care provider may ask you questions about:  Alcohol, tobacco, and drug use.  Emotional well-being.  Home and relationship well-being.  Sexual activity.  Eating habits.  History of falls.  Memory and ability to understand (cognition).  Work and work Statistician. What immunizations do I need?  Influenza (flu) vaccine  This is recommended every year. Tetanus, diphtheria, and pertussis (Tdap) vaccine  You may need a Td booster every 10 years. Varicella (chickenpox) vaccine  You may need this vaccine if you have not already been vaccinated. Zoster (shingles) vaccine  You may need this after age 50. Pneumococcal conjugate (PCV13) vaccine  One dose is recommended after age 24. Pneumococcal polysaccharide (PPSV23) vaccine  One dose is recommended after age 33. Measles, mumps, and rubella (MMR) vaccine  You may need at least one dose of MMR if you were born in 1957 or later. You may also need a second dose. Meningococcal conjugate (MenACWY) vaccine  You may need this if you have certain conditions. Hepatitis A vaccine  You may need this if you have certain conditions or if you travel or work in places where you may be exposed to hepatitis A. Hepatitis B vaccine   You may need this if you have certain conditions or if you travel or work in places where you may be exposed to hepatitis B. Haemophilus influenzae type b (Hib) vaccine  You may need this if you have certain conditions. You may receive vaccines as individual doses or as more than one vaccine together in one shot (combination vaccines). Talk with your health care provider about the risks and benefits of combination vaccines. What tests do I need? Blood tests  Lipid and cholesterol levels. These may be checked every 5 years, or more frequently depending on your overall health.  Hepatitis C test.  Hepatitis B test. Screening  Lung cancer screening. You may have this screening every year starting at age 74 if you have a 30-pack-year history of smoking and currently smoke or have quit within the past 15 years.  Colorectal cancer screening. All adults should have this screening starting at age 57 and continuing until age 54. Your health care provider may recommend screening at age 47 if you are at increased risk. You will have tests every 1-10 years, depending on your results and the type of screening test.  Prostate cancer screening. Recommendations will vary depending on your family history and other risks.  Diabetes screening. This is done by checking your blood sugar (glucose) after you have not eaten for a while (fasting). You may have this done every 1-3 years.  Abdominal aortic aneurysm (AAA) screening. You may need this if you are a current or former smoker.  Sexually transmitted disease (STD) testing. Follow these instructions at home: Eating and drinking  Eat  a diet that includes fresh fruits and vegetables, whole grains, lean protein, and low-fat dairy products. Limit your intake of foods with high amounts of sugar, saturated fats, and salt.  Take vitamin and mineral supplements as recommended by your health care provider.  Do not drink alcohol if your health care provider  tells you not to drink.  If you drink alcohol: ? Limit how much you have to 0-2 drinks a day. ? Be aware of how much alcohol is in your drink. In the [place] one drink equals one 12 oz bottle of beer (355 mL), one 5 oz glass of wine (148 mL), or one 1 oz glass of hard liquor (44 mL). Lifestyle  Take daily care of your teeth and gums.  Stay active. Exercise for at least 30 minutes on 5 or more days each week.  Do not use any products that contain nicotine or tobacco, such as cigarettes, e-cigarettes, and chewing tobacco. If you need help quitting, ask your health care provider.  If you are sexually active, practice safe sex. Use a condom or other form of protection to prevent STIs (sexually transmitted infections).  Talk with your health care provider about taking a low-dose aspirin or statin. What's next?  Visit your health care provider once a year for a well check visit.  Ask your health care provider how often you should have your eyes and teeth checked.  Stay up to date on all vaccines. This information is not intended to replace advice given to you by your health care provider. Make sure you discuss any questions you have with your health care provider. Document Released: 10/17/2015 Document Revised: 09/14/2018 Document Reviewed: 09/14/2018 Elsevier Patient Education  2020 [employer]  

## 2019-04-11 NOTE — Progress Notes (Signed)
MEDICARE ANNUAL WELLNESS VISIT  04/11/2019  Telephone Visit Disclaimer This Medicare AWV was conducted by telephone due to national recommendations for restrictions regarding the COVID-19 Pandemic (e.g. social distancing).  I verified, using two identifiers, that I am speaking with [name] or their authorized healthcare agent. I discussed the limitations, risks, security, and privacy concerns of performing an evaluation and management service by telephone and the potential availability of an in-person appointment in the future. The patient expressed understanding and agreed to proceed.   Subjective:  [name] [name] is a 78 y.o. male patient of Stacks, [name] Gash, MD who had a Medicare Annual Wellness Visit today via telephone. [name] is Retired and lives with their spouse. he has 2 children. he reports that he is socially active and does interact with friends/family regularly. he is moderately physically active and enjoys playing golf and going to watch his grandson play baseball. His grandson just signed with [employer] to play baseball.  Patient Care Team: Claretta Fraise, MD as PCP - General (Family Medicine) Larey Dresser, MD as PCP - Advanced Heart Failure (Cardiology) Pyrtle, Lajuan Lines, MD as Consulting Physician (Gastroenterology) Belva Crome, MD as Consulting Physician (Cardiology) Evans Lance, MD as Consulting Physician (Cardiology)  Advanced Directives 04/11/2019 11/08/2017 04/05/2017 10/07/2016 07/15/2016 06/14/2016 03/22/2016  Does Patient Have a Medical Advance Directive? No No No No No Yes No  Type of Advance Directive - - - - - Press photographer;Living will -  Copy of [place] in Chart? - - - - - No - copy requested -  Would patient like information on creating a medical advance directive? No - Patient declined Yes (MAU/Ambulatory/Procedural Areas - Information given) No - Patient declined - No - patient declined information - No -  patient declined information    Hospital Utilization Over the Past 12 Months: # of hospitalizations or ER visits: 0 # of surgeries: 0  Review of Systems    Patient reports that his overall health is unchanged compared to last year.  Patient Reported Readings (BP, Pulse, CBG, Weight, etc) none  Review of Systems: No complaints  All other systems negative.  Pain Assessment Pain : No/denies pain     Current Medications & Allergies (verified) Allergies as of 04/11/2019   No Known Allergies     Medication List       Accurate as of April 11, 2019  9:17 AM. If you have any questions, ask your nurse or doctor.        albuterol 108 (90 Base) MCG/ACT inhaler Commonly known as: ProAir HFA Use 2 puffs every 6 hours  as needed for shortness of  breath   budesonide 0.25 MG/2ML nebulizer solution Commonly known as: PULMICORT USE 1 VIAL  IN  NEBULIZER TWICE  DAILY - rinse mouth after treatment   carvedilol 3.125 MG tablet Commonly known as: COREG Take 1 tablet (3.125 mg total) by mouth 2 (two) times daily with a meal.   Clear Eyes for Dry Eyes Plus 0.8-0.25-0.012 % Soln Generic drug: Hypromell-Glycerin-Naphazoline Place 1-2 drops into both eyes 3 (three) times daily as needed (for dry eyes.).   docusate sodium 100 MG capsule Commonly known as: COLACE Take 100 mg by mouth daily.   Entresto 97-103 MG Generic drug: sacubitril-valsartan TAKE 1 TABLET BY MOUTH TWICE A DAY   ferrous sulfate 325 (65 FE) MG tablet Take 325 mg by mouth 2 (two) times daily.   fluticasone furoate-vilanterol 200-25 MCG/INH  Aepb Commonly known as: BREO ELLIPTA Inhale 1 puff into the lungs daily.   furosemide 40 MG tablet Commonly known as: LASIX Take 0.5 tablets (20 mg total) by mouth daily.   hydrALAZINE 50 MG tablet Commonly known as: APRESOLINE TAKE 1 TABLET BY MOUTH THREE TIMES A DAY   ipratropium-albuterol 0.5-2.5 (3) MG/3ML Soln Commonly known as: DUONEB USE 1 VIAL IN NEBULIZER 4  TIMES DAILY   levothyroxine 112 MCG tablet Commonly known as: SYNTHROID TAKE 1 TABLET BY MOUTH  DAILY BEFORE BREAKFAST   loratadine 10 MG tablet Commonly known as: CLARITIN Take 10 mg by mouth daily as needed (for allergies.).   lovastatin 40 MG tablet Commonly known as: MEVACOR TAKE 2 TABLETS BY MOUTH AT  BEDTIME   multivitamins ther. w/minerals Tabs tablet Take 1 tablet by mouth daily.   nitroGLYCERIN 0.4 MG SL tablet Commonly known as: NITROSTAT Place 1 tablet (0.4 mg total) under the tongue every 5 (five) minutes as needed for chest pain.   pantoprazole 40 MG tablet Commonly known as: PROTONIX Take 1 tablet (40 mg total) by mouth 2 (two) times daily.   spironolactone 25 MG tablet Commonly known as: ALDACTONE TAKE 1/2 TABLET BY MOUTH EVERY DAY   sucralfate 1 g tablet Commonly known as: CARAFATE Take 1 tablet (1 g total) by mouth 4 (four) times daily -  with meals and at bedtime. With breakfast and supper.   Vitamin D3 125 MCG (5000 UT) Caps Take 5,000 Units by mouth daily.   warfarin 5 MG tablet Commonly known as: COUMADIN Take as directed by the anticoagulation clinic. If you are unsure how to take this medication, talk to your nurse or doctor. Original instructions: TAKE 1 TABLET BY MOUTH ON  MONDAY WEDNESDAY AND FRIDAY AND 1/2 TABLET ALL OTHER  DAYS       History (reviewed): Past Medical History:  Diagnosis Date  . 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 Anemia   . Arthritis    "hips; back" (07/09/2014)  . Asthma   . Atrial fibrillation ([place])   . CAD (coronary artery disease)   . CHF (congestive heart failure) ([place])   . Cholecystitis 11/2013  . 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   . COPD (chronic obstructive pulmonary disease) ([facility])    on home oxygen, 2 liters at night10/2015  . Esophageal reflux   . Gallstones   . Gastric antral vascular ectasia 2013  . Gout   . Hepatomegaly   . Hiatal hernia   . History of  blood transfusion ~ 2012   "blood count dropped; had to get 3 units"  . Hyperlipidemia   . Hypothyroidism   . Other and unspecified coagulation defects   . Personal history of colonic polyps 05/29/2010   TUBULAR ADENOMA  . Unspecified essential hypertension    Past Surgical History:  Procedure Laterality Date  . CARDIAC CATHETERIZATION N/A 07/15/2016   Procedure: Right/Left Heart Cath and Coronary/Graft Angiography;  Surgeon: Belva Crome, MD;  Location: [facility];  Service: Cardiovascular;  Laterality: N/A;  . cholecystomy tube  12/28/2014 - 01/06/2015   tube clogged with debris, removed and IR unable to place new tube.   . COLONOSCOPY  2013   Dr. Sharlett Iles: no polyps or evidence of active bleeding  . CORONARY ANGIOPLASTY WITH STENT PLACEMENT  ~ 2008; 07/08/2014   "1 + 3"  . CORONARY ARTERY BYPASS GRAFT  1998   CABG X4  . ESOPHAGOGASTRODUODENOSCOPY  2013  Dr. 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 ESOPHAGOGASTRODUODENOSCOPY N/A 12/12/2015   Procedure: ESOPHAGOGASTRODUODENOSCOPY (EGD);  Surgeon: Gatha Mayer, MD;  Location: [facility];  Service: Endoscopy;  Laterality: N/A;  . GIVENS CAPSULE STUDY  2013   Dr. Sharlett Iles: AVM at 67 and blood at 30 minutes beyond first duodenal image but not actual lesion seen. If persistent IDA, bleeding, recommend enteroscopy with ablation   . HERNIA REPAIR    . ICD IMPLANT N/A 04/05/2017   Procedure: ICD Implant;  Surgeon: Evans Lance, MD;  Location: [facility];  Service: Cardiovascular;  Laterality: N/A;  . LEFT AND RIGHT HEART CATHETERIZATION WITH CORONARY/GRAFT ANGIOGRAM N/A 07/09/2014   Right and left heart cath, bare metal stent to SVG to RCA. Sinclair Grooms, MD;   . UMBILICAL HERNIA REPAIR  [phone]   Family History  Problem Relation Age of Onset  . Leukemia Mother   . Kidney disease Mother        kidney removed   . Heart attack Brother   . Heart disease Brother   .  Heart disease Father   . Stomach cancer Sister   . Stomach cancer Sister   . Early death Brother   . Hypertension Other        family   . Colon cancer Neg Hx    Social History   Socioeconomic History  . Marital status: Married    Spouse name: [name]  . Number of children: 2  . Years of education: 64  . Highest education level: High school graduate  Occupational History  . Occupation: retired    Comment: [employer]  Social Needs  . Financial resource strain: Not hard at all  . Food insecurity    Worry: Never true    Inability: Never true  . Transportation needs    Medical: No    Non-medical: No  Tobacco Use  . Smoking status: Former Smoker    Packs/day: 1.00    Years: 42.00    Pack years: 42.00    Types: Cigarettes    Quit date: 12/16/1996    Years since quitting: 22.3  . Smokeless tobacco: Never Used  Substance and Sexual Activity  . Alcohol use: Yes    Alcohol/week: 1.0 standard drinks    Types: 1 Cans of beer per week    Comment: 1 per month  . Drug use: No  . Sexual activity: Not Currently    Birth control/protection: None  Lifestyle  . Physical activity    Days per week: 5 days    Minutes per session: 30 min  . Stress: Not at all  Relationships  . Social connections    Talks on phone: More than three times a week    Gets together: More than three times a week    Attends religious service: More than 4 times per year    Active member of club or organization: Yes    Attends meetings of clubs or organizations: More than 4 times per year    Relationship status: Married  Other Topics Concern  . Not on file  Social History Narrative  . Not on file    Activities of Daily Living In your present state of health, do you have any difficulty performing the following activities: 04/11/2019  Hearing? N  Vision? N  Difficulty concentrating or making decisions? N  Walking or climbing stairs? Y  Comment he gets SOB if there are a lot of  stairs so he  takes his time  Dressing or bathing? N  Doing errands, shopping? N  Preparing Food and eating ? N  Using the Toilet? N  In the past six months, have you accidently leaked urine? N  Do you have problems with loss of bowel control? N  Managing your Medications? N  Managing your Finances? N  Housekeeping or managing your Housekeeping? N  Some recent data might be hidden    Patient Literacy How often do you need to have someone help you when you read instructions, pamphlets, or other written materials from your doctor or pharmacy?: 1 - Never What is the last grade level you completed in school?: 12th grade  Exercise Current Exercise Habits: Home exercise routine, Type of exercise: walking(playing golf), Time (Minutes): 30, Frequency (Times/Week): 5, Weekly Exercise (Minutes/Week): 150, Intensity: Mild, Exercise limited by: respiratory conditions(s)  Diet Patient reports consuming 2 meals a day and 2 snack(s) a day Patient reports that his primary diet is: Regular Patient reports that she does have regular access to food.   Depression Screen PHQ 2/9 Scores 04/11/2019 02/01/2019 10/18/2018 07/18/2018 05/19/2018 04/17/2018 01/31/2018  PHQ - 2 Score 0 0 0 0 3 0 2  PHQ- 9 Score - - - 0 4 - 4     Fall Risk Fall Risk  04/11/2019 02/01/2019 10/18/2018 07/18/2018 05/19/2018  Falls in the past year? 0 0 0 No No     Objective:  Oluwatobiloba R Juarbe seemed alert and oriented and he participated appropriately during our telephone visit.  Blood Pressure Weight BMI  BP Readings from Last 3 Encounters:  02/01/19 (!) 131/57  11/03/18 126/60  10/18/18 (!) 106/55   Wt Readings from Last 3 Encounters:  02/01/19 167 lb (75.8 kg)  11/03/18 177 lb 6.4 oz (80.5 kg)  10/18/18 180 lb 2 oz (81.7 kg)   BMI Readings from Last 1 Encounters:  02/01/19 24.66 kg/m    *Unable to obtain current vital signs, weight, and BMI due to telephone visit type  Hearing/Vision  . Jeramey did not seem to have difficulty with  hearing/understanding during the telephone conversation . Reports that he has not had a formal eye exam by an eye care professional within the past year . Reports that he has not had a formal hearing evaluation within the past year *Unable to fully assess hearing and vision during telephone visit type  Cognitive Function: 6CIT Screen 04/11/2019  What Year? 0 points  What month? 0 points  What time? 0 points  Count back from 20 0 points  Months in reverse 0 points  Repeat phrase 0 points  Total Score 0   (Normal:0-7, Significant for Dysfunction: >8)  Normal Cognitive Function Screening: Yes   Immunization & Health Maintenance Record Immunization History  Administered Date(s) Administered  . Influenza Whole 11/18/2010  . Influenza, High Dose Seasonal PF 07/20/2017, 07/18/2018  . Influenza,inj,Quad PF,6+ Mos 07/10/2014, 07/09/2015, 08/06/2016  . Pneumococcal Conjugate-13 04/26/2014  . Pneumococcal Polysaccharide-23 01/16/2018    Health Maintenance  Topic Date Due  . TETANUS/TDAP  07/19/2019 (Originally 07/05/1960)  . INFLUENZA VACCINE  05/05/2019  . PNA vac Low Risk Adult  Completed       Assessment  This is a routine wellness examination for Huxley R Sibilia.  Health Maintenance: Due or Overdue There are no preventive care reminders to display for this patient.  Lorrin R Castellana does not need a referral for [employer] Assistance: Care Management:   no Social Work:  no Prescription Assistance:  no Nutrition/Diabetes Education:  no   Plan:  Personalized Goals Goals Addressed            This Visit's Progress   . DIET - INCREASE WATER INTAKE       Try to drink 6-8 glasses of water.      Personalized Health Maintenance & Screening Recommendations  Td vaccine  Shingles vaccine  Lung Cancer Screening Recommended: no (Low Dose CT Chest recommended if Age 47-80 years, 30 pack-year currently smoking OR have quit w/in past 15 years) Hepatitis C Screening  recommended: no HIV Screening recommended: no  Advanced Directives: Written information was not prepared per patient's request.  Referrals & Orders No orders of the defined types were placed in this encounter.   Follow-up Plan . Follow-up with Claretta Fraise, MD as planned . Consider TDAP and Shingles vaccines at your next visit with your PCP   I have personally reviewed and noted the following in the patient's chart:   . Medical and social history . Use of alcohol, tobacco or illicit drugs  . Current medications and supplements . Functional ability and status . Nutritional status . Physical activity . Advanced directives . List of other physicians . Hospitalizations, surgeries, and ER visits in previous 12 months . Vitals . Screenings to include cognitive, depression, and falls . Referrals and appointments  In addition, I have reviewed and discussed with Rudell R Rigel certain preventive protocols, quality metrics, and best practice recommendations. A written personalized care plan for preventive services as well as general preventive health recommendations is available and can be mailed to the patient at his request.      Marylin Crosby, LPN  03/04/6836

## 2019-04-12 ENCOUNTER — Encounter: Payer: Self-pay | Admitting: Cardiology

## 2019-04-12 NOTE — Progress Notes (Signed)
Remote ICD transmission.   

## 2019-04-18 ENCOUNTER — Encounter: Payer: Self-pay | Admitting: Family Medicine

## 2019-04-18 ENCOUNTER — Ambulatory Visit: Payer: Medicare Other | Admitting: Family Medicine

## 2019-04-18 ENCOUNTER — Ambulatory Visit (INDEPENDENT_AMBULATORY_CARE_PROVIDER_SITE_OTHER): Payer: Medicare Other | Admitting: Family Medicine

## 2019-04-18 DIAGNOSIS — J449 Chronic obstructive pulmonary disease, unspecified: Secondary | ICD-10-CM

## 2019-04-18 DIAGNOSIS — Z7901 Long term (current) use of anticoagulants: Secondary | ICD-10-CM

## 2019-04-18 DIAGNOSIS — I482 Chronic atrial fibrillation, unspecified: Secondary | ICD-10-CM | POA: Diagnosis not present

## 2019-04-18 DIAGNOSIS — I10 Essential (primary) hypertension: Secondary | ICD-10-CM | POA: Diagnosis not present

## 2019-04-18 DIAGNOSIS — D500 Iron deficiency anemia secondary to blood loss (chronic): Secondary | ICD-10-CM | POA: Diagnosis not present

## 2019-04-18 DIAGNOSIS — R6889 Other general symptoms and signs: Secondary | ICD-10-CM | POA: Diagnosis not present

## 2019-04-18 DIAGNOSIS — Z7689 Persons encountering health services in other specified circumstances: Secondary | ICD-10-CM | POA: Diagnosis not present

## 2019-04-18 DIAGNOSIS — E038 Other specified hypothyroidism: Secondary | ICD-10-CM | POA: Diagnosis not present

## 2019-04-18 LAB — COAGUCHEK XS/INR WAIVED
INR: 1.8 — ABNORMAL HIGH (ref 0.9–1.1)
Prothrombin Time: 21.8 s

## 2019-04-18 NOTE — Progress Notes (Signed)
Subjective:    Patient ID: [name] [name], male    DOB: 08-23-1941, 78 y.o.   MRN: [id number]   HPI: CARMINO OCAIN is a 78 y.o. male presenting for   Left foot going numb from time to time. Lasted for 2 holes of his golf game yesterday. Feels a little weak in the leg when it happens, but able to continue his golf game. He denies any dyspnea except if he is outside in the heat of the day for an extended time. There is no pedal edema.   presents for  follow-up of hypertension. Patient has no history of headache chest pain or shortness of breath or recent cough. Patient also denies symptoms of TIA such as focal numbness or weakness. Patient denies side effects from medication. States taking it regularly.   in for follow-up of elevated cholesterol. Doing well without complaints on current medication. Denies side effects of statin including myalgia and arthralgia and nausea. Currently no chest pain, shortness of breath or other cardiovascular related symptoms noted.  Patient in for follow-up of atrial fibrillation. Patient denies any recent bouts of chest pain or palpitations. Additionally, patient is taking anticoagulants. Patient denies any recent excessive bleeding episodes including epistaxis, bleeding from the gums, genitalia, rectal bleeding or hematuria. Additionally there has been no excessive bruising.   Depression screen [facility] 2/9 04/11/2019 02/01/2019 10/18/2018 07/18/2018 05/19/2018  Decreased Interest 0 0 0 0 2  Down, Depressed, Hopeless 0 0 0 0 1  PHQ - 2 Score 0 0 0 0 3  Altered sleeping - - - 0 0  Tired, decreased energy - - - 0 1  Change in appetite - - - 0 0  Feeling bad or failure about yourself  - - - 0 0  Trouble concentrating - - - 0 0  Moving slowly or fidgety/restless - - - 0 0  Suicidal thoughts - - - 0 0  PHQ-9 Score - - - 0 4  Some recent data might be hidden     Relevant past medical, surgical, family and social history reviewed and updated as indicated.   Interim medical history since our last visit reviewed. Allergies and medications reviewed and updated.  ROS:  Review of Systems  Constitutional: Negative.   HENT: Negative.   Eyes: Negative for visual disturbance.  Respiratory: Negative for cough and shortness of breath.   Cardiovascular: Negative for chest pain and leg swelling.  Gastrointestinal: Negative for abdominal pain, diarrhea, nausea and vomiting.  Genitourinary: Negative for difficulty urinating.  Musculoskeletal: Negative for arthralgias and myalgias.  Skin: Negative for rash.  Neurological: Positive for weakness and numbness. Negative for headaches.  Psychiatric/Behavioral: Negative for sleep disturbance.     Social History   Tobacco Use  Smoking Status Former Smoker  . Packs/day: 1.00  . Years: 42.00  . Pack years: 42.00  . Types: Cigarettes  . Quit date: 12/16/1996  . Years since quitting: 22.3  Smokeless Tobacco Never Used       Objective:     Wt Readings from Last 3 Encounters:  02/01/19 167 lb (75.8 kg)  11/03/18 177 lb 6.4 oz (80.5 kg)  10/18/18 180 lb 2 oz (81.7 kg)     Exam deferred. Pt. [place] due to COVID 19. Phone visit performed.   Assessment & Plan:   1. Essential hypertension   2. Asthma with COPD ([place])   3. Chronic anticoagulation   4. Atrial fibrillation, chronic   5. Other specified hypothyroidism  No orders of the defined types were placed in this encounter.   Orders Placed This Encounter  Procedures  . CoaguChek XS/INR Waived  . CBC with Differential/Platelet  . CMP14+EGFR    Order Specific Question:   Has the patient fasted?    Answer:   Yes      Diagnoses and all orders for this visit:  Essential hypertension -     CMP14+EGFR  Asthma with COPD ([place]) -     CBC with Differential/Platelet -     CMP14+EGFR  Chronic anticoagulation  Atrial fibrillation, chronic -     CoaguChek XS/INR Waived -     CBC with Differential/Platelet -     CMP14+EGFR  Other  specified hypothyroidism    Virtual Visit via telephone Note  I discussed the limitations, risks, security and privacy concerns of performing an evaluation and management service by telephone and the availability of in person appointments. The patient was identified with two identifiers. Pt.expressed understanding and agreed to proceed. Pt. Is at home. Dr. Livia Snellen is in his office.  Follow Up Instructions:   I discussed the assessment and treatment plan with the patient. The patient was provided an opportunity to ask questions and all were answered. The patient agreed with the plan and demonstrated an understanding of the instructions.   The patient was advised to call back or seek an in-person evaluation if the symptoms worsen or if the condition fails to improve as anticipated.   Total minutes including chart review and phone contact time: 25   Follow up plan: Return in about 6 weeks (around 05/30/2019) for CHF, COPD, INR.  Claretta Fraise, MD [place]

## 2019-04-19 LAB — CMP14+EGFR
ALT: 8 IU/L (ref 0–44)
AST: 15 IU/L (ref 0–40)
Albumin/Globulin Ratio: 1.9 (ref 1.2–2.2)
Albumin: 3.9 g/dL (ref 3.7–4.7)
Alkaline Phosphatase: 49 IU/L (ref 39–117)
BUN/Creatinine Ratio: 18 (ref 10–24)
BUN: 34 mg/dL — ABNORMAL HIGH (ref 8–27)
Bilirubin Total: 0.5 mg/dL (ref 0.0–1.2)
CO2: 22 mmol/L (ref 20–29)
Calcium: 9.1 mg/dL (ref 8.6–10.2)
Chloride: 104 mmol/L (ref 96–106)
Creatinine, Ser: 1.87 mg/dL — ABNORMAL HIGH (ref 0.76–1.27)
GFR calc Af Amer: 39 mL/min/{1.73_m2} — ABNORMAL LOW (ref >59)
GFR calc non Af Amer: 33 mL/min/{1.73_m2} — ABNORMAL LOW (ref >59)
Globulin, Total: 2.1 g/dL (ref 1.5–4.5)
Glucose: 92 mg/dL (ref 65–99)
Potassium: 4.5 mmol/L (ref 3.5–5.2)
Sodium: 142 mmol/L (ref 134–144)
Total Protein: 6 g/dL (ref 6.0–8.5)

## 2019-04-19 LAB — CBC WITH DIFFERENTIAL/PLATELET
Basophils Absolute: 0.1 10*3/uL (ref 0.0–0.2)
Basos: 1 %
EOS (ABSOLUTE): 0.3 10*3/uL (ref 0.0–0.4)
Eos: 3 %
Hematocrit: 32.1 % — ABNORMAL LOW (ref 37.5–51.0)
Hemoglobin: 10.6 g/dL — ABNORMAL LOW (ref 13.0–17.7)
Immature Grans (Abs): 0 10*3/uL (ref 0.0–0.1)
Immature Granulocytes: 0 %
Lymphocytes Absolute: 1.5 10*3/uL (ref 0.7–3.1)
Lymphs: 16 %
MCH: 33.1 pg — ABNORMAL HIGH (ref 26.6–33.0)
MCHC: 33 g/dL (ref 31.5–35.7)
MCV: 100 fL — ABNORMAL HIGH (ref 79–97)
Monocytes Absolute: 1 10*3/uL — ABNORMAL HIGH (ref 0.1–0.9)
Monocytes: 11 %
Neutrophils Absolute: 6.5 10*3/uL (ref 1.4–7.0)
Neutrophils: 69 %
Platelets: 252 10*3/uL (ref 150–450)
RBC: 3.2 x10E6/uL — ABNORMAL LOW (ref 4.14–5.80)
RDW: 11.8 % (ref 11.6–15.4)
WBC: 9.4 10*3/uL (ref 3.4–10.8)

## 2019-04-21 ENCOUNTER — Other Ambulatory Visit: Payer: Self-pay | Admitting: Family Medicine

## 2019-04-21 LAB — FOLATE: Folate: 19.4 ng/mL (ref >3.0)

## 2019-04-21 LAB — VITAMIN B12: Vitamin B-12: 576 pg/mL (ref 232–1245)

## 2019-04-21 LAB — IRON AND TIBC
Iron Saturation: 26 % (ref 15–55)
Iron: 69 ug/dL (ref 38–169)
Total Iron Binding Capacity: 268 ug/dL (ref 250–450)
UIBC: 199 ug/dL (ref 111–343)

## 2019-04-21 LAB — SPECIMEN STATUS REPORT

## 2019-04-21 LAB — FERRITIN: Ferritin: 347 ng/mL (ref 30–400)

## 2019-04-23 ENCOUNTER — Ambulatory Visit: Payer: Medicare Other | Admitting: Family Medicine

## 2019-04-23 ENCOUNTER — Telehealth: Payer: Self-pay | Admitting: Family Medicine

## 2019-04-23 NOTE — Telephone Encounter (Signed)
NA, no VM [id number]-jhb

## 2019-04-24 ENCOUNTER — Telehealth: Payer: Self-pay | Admitting: Family Medicine

## 2019-04-24 NOTE — Telephone Encounter (Signed)
Gave results, will call when added labs come back

## 2019-04-25 NOTE — Telephone Encounter (Signed)
Patient aware and verbalizes understanding. 

## 2019-04-25 NOTE — Telephone Encounter (Signed)
lmtcb

## 2019-04-25 NOTE — Telephone Encounter (Signed)
DR Dettinger, please address labs (should be in St[facility] in basket )- pt is concerned about decreased HGB.  Please address. Thank you.  ( needs call before 245 pm today)

## 2019-04-25 NOTE — Telephone Encounter (Signed)
Patient's kidney function looks stable and unchanged but his hemoglobin is down just slightly at 10.6 from 11.  This is only a slight drop and is likely due to chronic kidney disease because the kidneys are involved in blood production.  This slight drop is not of any major concern but is just something he should probably keep a close eye on and check every few months.  This is also something he should probably discuss with his nephrologist because if it continues to go down then they may need to intervene.  Based on the numbers it does not look like iron deficiency is the problem but more chronic kidney disease is the problem.  Watch for any signs of blood in his stool and let us know if he has any [name], MD [facility] 04/25/2019, 12:49 PM

## 2019-04-28 DIAGNOSIS — J449 Chronic obstructive pulmonary disease, unspecified: Secondary | ICD-10-CM | POA: Diagnosis not present

## 2019-05-03 ENCOUNTER — Other Ambulatory Visit (HOSPITAL_COMMUNITY): Payer: Self-pay | Admitting: Cardiology

## 2019-05-03 ENCOUNTER — Telehealth: Payer: Self-pay

## 2019-05-03 DIAGNOSIS — J45998 Other asthma: Secondary | ICD-10-CM | POA: Diagnosis not present

## 2019-05-03 DIAGNOSIS — J449 Chronic obstructive pulmonary disease, unspecified: Secondary | ICD-10-CM | POA: Diagnosis not present

## 2019-05-03 NOTE — Telephone Encounter (Signed)

## 2019-05-04 ENCOUNTER — Ambulatory Visit (INDEPENDENT_AMBULATORY_CARE_PROVIDER_SITE_OTHER): Payer: Medicare Other | Admitting: Internal Medicine

## 2019-05-04 ENCOUNTER — Other Ambulatory Visit: Payer: Self-pay

## 2019-05-04 ENCOUNTER — Encounter: Payer: Self-pay | Admitting: Internal Medicine

## 2019-05-04 VITALS — BP 120/58 | HR 60 | Ht 69.0 in | Wt 163.6 lb

## 2019-05-04 DIAGNOSIS — I482 Chronic atrial fibrillation, unspecified: Secondary | ICD-10-CM | POA: Diagnosis not present

## 2019-05-04 DIAGNOSIS — Z95810 Presence of automatic (implantable) cardiac defibrillator: Secondary | ICD-10-CM | POA: Diagnosis not present

## 2019-05-04 DIAGNOSIS — I472 Ventricular tachycardia: Secondary | ICD-10-CM | POA: Diagnosis not present

## 2019-05-04 DIAGNOSIS — I5022 Chronic systolic (congestive) heart failure: Secondary | ICD-10-CM | POA: Diagnosis not present

## 2019-05-04 DIAGNOSIS — I10 Essential (primary) hypertension: Secondary | ICD-10-CM | POA: Diagnosis not present

## 2019-05-04 DIAGNOSIS — I4729 Other ventricular tachycardia: Secondary | ICD-10-CM

## 2019-05-04 LAB — CUP PACEART INCLINIC DEVICE CHECK
Date Time Interrogation Session: 20200731040000
HighPow Impedance: 68 Ohm
Implantable Lead Implant Date: 20180703
Implantable Lead Implant Date: 20180703
Implantable Lead Location: 753859
Implantable Lead Location: 753860
Implantable Lead Model: 293
Implantable Lead Model: 3830
Implantable Lead Serial Number: 433180
Implantable Pulse Generator Implant Date: 20180703
Lead Channel Impedance Value: 435 Ohm
Lead Channel Impedance Value: 843 Ohm
Lead Channel Pacing Threshold Amplitude: 0.7 V
Lead Channel Pacing Threshold Amplitude: 1.6 V
Lead Channel Pacing Threshold Pulse Width: 0.4 ms
Lead Channel Pacing Threshold Pulse Width: 1 ms
Lead Channel Sensing Intrinsic Amplitude: 2.3 mV
Lead Channel Sensing Intrinsic Amplitude: 7.3 mV
Lead Channel Setting Pacing Amplitude: 2.5 V
Lead Channel Setting Pacing Amplitude: 3.5 V
Lead Channel Setting Pacing Pulse Width: 0.4 ms
Lead Channel Setting Sensing Sensitivity: 0.5 mV
Pulse Gen Serial Number: 533886

## 2019-05-04 NOTE — Progress Notes (Signed)
HPI [name] today for ongoing evaluation and management of his chronic systolic heart failure, chronic atrial fibrillation, coronary artery disease, renal insufficiency, status post ICD implantation. He has asymptomatic carotid disease. In the interim, the patient is been stable. He is able to play golf but notes the day after he plays that he is more tired. He has not been in the hospital with CHF or ICD shocks. He has chronic atrial fib. No Known Allergies   Current Outpatient Medications  Medication Sig Dispense Refill  . albuterol (PROAIR HFA) 108 (90 Base) MCG/ACT inhaler Use 2 puffs every 6 hours  as needed for shortness of  breath 34 g 3  . budesonide (PULMICORT) 0.25 MG/2ML nebulizer solution USE 1 VIAL  IN  NEBULIZER TWICE  DAILY - rinse mouth after treatment 120 mL 1  . carvedilol (COREG) 3.125 MG tablet TAKE 1 TABLET (3.125 MG TOTAL) BY MOUTH 2 (TWO) TIMES DAILY WITH A MEAL. 180 tablet 2  . Cholecalciferol (VITAMIN D3) 5000 UNITS CAPS Take 5,000 Units by mouth daily.     Marland Kitchen docusate sodium (COLACE) 100 MG capsule Take 100 mg by mouth daily.     Marland Kitchen ENTRESTO 97-103 MG TAKE 1 TABLET BY MOUTH TWICE A DAY 180 tablet 1  . ferrous sulfate 325 (65 FE) MG tablet Take 325 mg by mouth 2 (two) times daily.    . fluticasone furoate-vilanterol (BREO ELLIPTA) 200-25 MCG/INH AEPB Inhale 1 puff into the lungs daily. 28 each 2  . furosemide (LASIX) 40 MG tablet Take 0.5 tablets (20 mg total) by mouth daily. 90 tablet 1  . hydrALAZINE (APRESOLINE) 50 MG tablet TAKE 1 TABLET BY MOUTH THREE TIMES A DAY 270 tablet 2  . Hypromell-Glycerin-Naphazoline (CLEAR EYES FOR DRY EYES PLUS) 0.8-0.25-0.012 % SOLN Place 1-2 drops into both eyes 3 (three) times daily as needed (for dry eyes.).    Marland Kitchen ipratropium-albuterol (DUONEB) 0.5-2.5 (3) MG/3ML SOLN USE 1 VIAL IN NEBULIZER 4 TIMES DAILY 360 mL 1  . levothyroxine (SYNTHROID) 112 MCG tablet TAKE 1 TABLET BY MOUTH  DAILY BEFORE BREAKFAST 90 tablet 3  .  loratadine (CLARITIN) 10 MG tablet Take 10 mg by mouth daily as needed (for allergies.).     Marland Kitchen lovastatin (MEVACOR) 40 MG tablet TAKE 2 TABLETS BY MOUTH AT  BEDTIME 180 tablet 1  . Multiple Vitamins-Minerals (MULTIVITAMINS THER. W/MINERALS) TABS Take 1 tablet by mouth daily.      . nitroGLYCERIN (NITROSTAT) 0.4 MG SL tablet Place 1 tablet (0.4 mg total) under the tongue every 5 (five) minutes as needed for chest pain. 6 tablet 1  . pantoprazole (PROTONIX) 40 MG tablet TAKE 1 TABLET BY MOUTH TWO  TIMES DAILY 180 tablet 1  . spironolactone (ALDACTONE) 25 MG tablet TAKE 1/2 TABLET BY MOUTH EVERY DAY 45 tablet 3  . sucralfate (CARAFATE) 1 g tablet TAKE 1 TABLET BY MOUTH 4  TIMES DAILY - WITH MEALS  AND AT BEDTIME. WITH  BREAKFAST AND SUPPER. 360 tablet 1  . warfarin (COUMADIN) 5 MG tablet TAKE 1 TABLET BY MOUTH  EVERY MONDAY, WEDNESDAY,  AND FRIDAY, AND 1/2 TABLET  THE REMAINING DAYS OF THE  WEEK 30 tablet 6   No current facility-administered medications for this visit.      Past Medical History:  Diagnosis Date  . 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 Anemia   . Arthritis    "hips; back" (07/09/2014)  . Asthma   . Atrial fibrillation ([place])   .  CAD (coronary artery disease)   . CHF (congestive heart failure) ([place])   . Cholecystitis 11/2013  . 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   . COPD (chronic obstructive pulmonary disease) ([facility])    on home oxygen, 2 liters at night10/2015  . Esophageal reflux   . Gallstones   . Gastric antral vascular ectasia 2013  . Gout   . Hepatomegaly   . Hiatal hernia   . History of blood transfusion ~ 2012   "blood count dropped; had to get 3 units"  . Hyperlipidemia   . Hypothyroidism   . Other and unspecified coagulation defects   . Personal history of colonic polyps 05/29/2010   TUBULAR ADENOMA  . Unspecified essential hypertension     ROS:   All systems reviewed and negative except as noted in the HPI.    Past Surgical History:  Procedure Laterality Date  . CARDIAC CATHETERIZATION N/A 07/15/2016   Procedure: Right/Left Heart Cath and Coronary/Graft Angiography;  Surgeon: Belva Crome, MD;  Location: [facility];  Service: Cardiovascular;  Laterality: N/A;  . cholecystomy tube  12/28/2014 - 01/06/2015   tube clogged with debris, removed and IR unable to place new tube.   . COLONOSCOPY  2013   Dr. Sharlett Iles: no polyps or evidence of active bleeding  . CORONARY ANGIOPLASTY WITH STENT PLACEMENT  ~ 2008; 07/08/2014   "1 + 3"  . CORONARY ARTERY BYPASS GRAFT  1998   CABG X4  . ESOPHAGOGASTRODUODENOSCOPY  2013   Dr. 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 ESOPHAGOGASTRODUODENOSCOPY N/A 12/12/2015   Procedure: ESOPHAGOGASTRODUODENOSCOPY (EGD);  Surgeon: Gatha Mayer, MD;  Location: [facility];  Service: Endoscopy;  Laterality: N/A;  . GIVENS CAPSULE STUDY  2013   Dr. Sharlett Iles: AVM at 66 and blood at 30 minutes beyond first duodenal image but not actual lesion seen. If persistent IDA, bleeding, recommend enteroscopy with ablation   . HERNIA REPAIR    . ICD IMPLANT N/A 04/05/2017   Procedure: ICD Implant;  Surgeon: Evans Lance, MD;  Location: [place] [facility];  Service: Cardiovascular;  Laterality: N/A;  . LEFT AND RIGHT HEART CATHETERIZATION WITH CORONARY/GRAFT ANGIOGRAM N/A 07/09/2014   Right and left heart cath, bare metal stent to SVG to RCA. Sinclair Grooms, MD;   . UMBILICAL HERNIA REPAIR  [phone]     Family History  Problem Relation Age of Onset  . Leukemia Mother   . Kidney disease Mother        kidney removed   . Heart attack Brother   . Heart disease Brother   . Heart disease Father   . Stomach cancer Sister   . Stomach cancer Sister   . Early death Brother   . Hypertension Other        family   . Colon cancer Neg Hx      Social History   Socioeconomic History  . Marital status: Married    Spouse name: [name] [name]   . Number of children: 2  . Years of education: 52  . Highest education level: High school graduate  Occupational History  . Occupation: retired    Comment: [employer]  Social Needs  . Financial resource strain: Not hard at all  . Food insecurity    Worry: Never true    Inability: Never true  . Transportation needs    Medical: No    Non-medical: No  Tobacco Use  .  Smoking status: Former Smoker    Packs/day: 1.00    Years: 42.00    Pack years: 42.00    Types: Cigarettes    Quit date: 12/16/1996    Years since quitting: 22.3  . Smokeless tobacco: Never Used  Substance and Sexual Activity  . Alcohol use: Yes    Alcohol/week: 1.0 standard drinks    Types: 1 Cans of beer per week    Comment: 1 per month  . Drug use: No  . Sexual activity: Not Currently    Birth control/protection: None  Lifestyle  . Physical activity    Days per week: 5 days    Minutes per session: 30 min  . Stress: Not at all  Relationships  . Social connections    Talks on phone: More than three times a week    Gets together: More than three times a week    Attends religious service: More than 4 times per year    Active member of club or organization: Yes    Attends meetings of clubs or organizations: More than 4 times per year    Relationship status: Married  . Intimate partner violence    Fear of current or ex partner: No    Emotionally abused: No    Physically abused: No    Forced sexual activity: No  Other Topics Concern  . Not on file  Social History Narrative  . Not on file     BP (!) 120/58   Pulse 60   Ht 5\' 9"  (1.753 m)   Wt 163 lb 9.6 oz (74.2 kg)   SpO2 96%   BMI 24.16 kg/m   Physical Exam:  Well appearing 78 yo man, NAD HEENT: Unremarkable Neck:  No JVD, no thyromegally Lymphatics:  No adenopathy Back:  No CVA tenderness Lungs:  Clear with no wheezes HEART:  Regular rate rhythm, no murmurs, no rubs, no clicks Abd:  soft, positive bowel sounds, no  organomegally, no rebound, no guarding Ext:  2 plus pulses, no edema, no cyanosis, no clubbing Skin:  No rashes no nodules Neuro:  CN II through XII intact, motor grossly intact  EKG - atrial fib with biv pacing  DEVICE  Normal device function.  See PaceArt for details.   Assess/Plan: 1. Chronic systolic heart failure - his symptoms are class 2. He will continue his current meds. 2. Atrial fib - his VR is well controlled. He will continue warfarin for prevention of stroke. 3. 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 4. ICD - his device is working normally. His atrial lead is pacing the LV at the His bundle.   [name] [name].D.

## 2019-05-04 NOTE — Patient Instructions (Signed)
Medication Instructions:  Your physician recommends that you continue on your current medications as directed. Please refer to the Current Medication list given to you today.  Labwork: None ordered.  Testing/Procedures: None ordered.  Follow-Up: Your physician wants you to follow-up in: one year with Dr. Lovena Le.   You will receive a reminder letter in the mail two months in advance. If you don't receive a letter, please call our office to schedule the follow-up appointment.  Remote monitoring is used to monitor your ICD from home. This monitoring reduces the number of office visits required to check your device to one time per year. It allows [place] to keep an eye on the functioning of your device to ensure it is working properly. You are scheduled for a device check from home on 07/05/2019. You may send your transmission at any time that day. If you have a wireless device, the transmission will be sent automatically. After your physician reviews your transmission, you will receive a postcard with your next transmission date.  Any Other Special Instructions Will Be Listed Below (If Applicable).  If you need a refill on your cardiac medications before your next appointment, please call your pharmacy.

## 2019-05-08 ENCOUNTER — Other Ambulatory Visit: Payer: Self-pay

## 2019-05-08 DIAGNOSIS — I6523 Occlusion and stenosis of bilateral carotid arteries: Secondary | ICD-10-CM

## 2019-05-09 ENCOUNTER — Telehealth (HOSPITAL_COMMUNITY): Payer: Self-pay | Admitting: Rehabilitation

## 2019-05-09 NOTE — Telephone Encounter (Signed)

## 2019-05-10 ENCOUNTER — Ambulatory Visit: Payer: Medicare Other | Admitting: Family

## 2019-05-10 ENCOUNTER — Ambulatory Visit (HOSPITAL_COMMUNITY)
Admission: RE | Admit: 2019-05-10 | Discharge: 2019-05-10 | Disposition: A | Discharge status: Home / Self Care | Payer: Medicare Other | Source: Ambulatory Visit | Attending: Family | Admitting: Family

## 2019-05-10 ENCOUNTER — Other Ambulatory Visit: Payer: Self-pay

## 2019-05-10 ENCOUNTER — Encounter: Payer: Self-pay | Admitting: Family

## 2019-05-10 VITALS — BP 150/57 | HR 95 | Temp 97.4°F | Resp 12 | Ht 70.0 in | Wt 165.9 lb

## 2019-05-10 DIAGNOSIS — I6523 Occlusion and stenosis of bilateral carotid arteries: Secondary | ICD-10-CM

## 2019-05-10 NOTE — Progress Notes (Signed)
Chief Complaint: Follow up Extracranial Carotid Artery Stenosis   History of Present Illness  [name] [name] is a 78 y.o. male whom Dr. Donnetta Hutching has been monitoring for bilateral carotid stenosis.   He denies any known history of stroke or TIA. Specifically he deniesa history of amaurosis fugax or monocular blindness, unilateral facial drooping, hemiplegia, orreceptive or expressive aphasia.  He has multiple medical problems including congestive heart failure pacemaker and AICD. He has chronic atrial fibrillation. Also has chronic renal insufficiency. He underwent duplex at [facility] discussed hospital demonstrating greater than 70% stenoses bilaterally.  Patient has not had previous carotid artery intervention.  Dr. Donnetta Hutching last evaluated pt on 03-03-18. At that time bilateral ICA stenosis was in the 60 to 79% evel bilaterally. Dr. Donnetta Hutching had a long discussion with patient regarding his carotid stenosis at that visit. He was asymptomatic. Dr. Donnetta Hutching explained that he is below the threshold of where we would recommend surgery for asymptomatic disease. At his level we would recommend 8-month interval duplex lifelong to rule out progression of stenosis. If he does have symptoms suggestive carotid disease, he will present immediately to the emergency room for further evaluation. Otherwise pt was to return in 6 months with repeat carotid duplex.  Pt states that he has injured his back, fell years ago and injured his lumbar spine.  He has occasional left foot numbness.   [facility], he now works in his yard and golfs once/week. His activity is limited by dyspnea. He denies any chest pain.   Diabetic: no Tobacco use: former smoker, quit in 1998, smoked x 42 years  Pt meds include: Statin : yes ASA: no Other anticoagulants/antiplatelets: warfarin, has atrial fib   Past Medical History:  Diagnosis Date  . AICD (automatic  cardioverter/defibrillator) present 04/05/2017  . Anemia   . Arthritis    "hips; back" (07/09/2014)  . Asthma   . Atrial fibrillation ([place])   . CAD (coronary artery disease)   . CHF (congestive heart failure) ([place])   . Cholecystitis 11/2013  . 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   . COPD (chronic obstructive pulmonary disease) ([facility])    on home oxygen, 2 liters at night10/2015  . Esophageal reflux   . Gallstones   . Gastric antral vascular ectasia 2013  . Gout   . Hepatomegaly   . Hiatal hernia   . History of blood transfusion ~ 2012   "blood count dropped; had to get 3 units"  . Hyperlipidemia   . Hypothyroidism   . Other and unspecified coagulation defects   . Personal history of colonic polyps 05/29/2010   TUBULAR ADENOMA  . Unspecified essential hypertension     Social History Social History   Tobacco Use  . Smoking status: Former Smoker    Packs/day: 1.00    Years: 42.00    Pack years: 42.00    Types: Cigarettes    Quit date: 12/16/1996    Years since quitting: 22.4  . Smokeless tobacco: Never Used  Substance Use Topics  . Alcohol use: Yes    Alcohol/week: 1.0 standard drinks    Types: 1 Cans of beer per week    Comment: 1 per month  . Drug use: No    Family History Family History  Problem Relation Age of Onset  . Leukemia Mother   . Kidney disease Mother        kidney removed   . Heart attack  Brother   . Heart disease Brother   . Heart disease Father   . Stomach cancer Sister   . Stomach cancer Sister   . Early death Brother   . Hypertension Other        family   . Colon cancer Neg Hx     Surgical History Past Surgical History:  Procedure Laterality Date  . CARDIAC CATHETERIZATION N/A 07/15/2016   Procedure: Right/Left Heart Cath and Coronary/Graft Angiography;  Surgeon: Belva Crome, MD;  Location: [place] [facility];  Service: Cardiovascular;  Laterality: N/A;  . cholecystomy tube  12/28/2014 - 01/06/2015   tube clogged with debris,  removed and IR unable to place new tube.   . COLONOSCOPY  2013   Dr. Sharlett Iles: no polyps or evidence of active bleeding  . CORONARY ANGIOPLASTY WITH STENT PLACEMENT  ~ 2008; 07/08/2014   "1 + 3"  . CORONARY ARTERY BYPASS GRAFT  1998   CABG X4  . ESOPHAGOGASTRODUODENOSCOPY  2013   Dr. 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 ESOPHAGOGASTRODUODENOSCOPY N/A 12/12/2015   Procedure: ESOPHAGOGASTRODUODENOSCOPY (EGD);  Surgeon: Gatha Mayer, MD;  Location: [facility];  Service: Endoscopy;  Laterality: N/A;  . GIVENS CAPSULE STUDY  2013   Dr. Sharlett Iles: AVM at 56 and blood at 30 minutes beyond first duodenal image but not actual lesion seen. If persistent IDA, bleeding, recommend enteroscopy with ablation   . HERNIA REPAIR    . ICD IMPLANT N/A 04/05/2017   Procedure: ICD Implant;  Surgeon: Evans Lance, MD;  Location: [place] [facility];  Service: Cardiovascular;  Laterality: N/A;  . LEFT AND RIGHT HEART CATHETERIZATION WITH CORONARY/GRAFT ANGIOGRAM N/A 07/09/2014   Right and left heart cath, bare metal stent to SVG to RCA. Sinclair Grooms, MD;   . UMBILICAL HERNIA REPAIR  [phone]    No Known Allergies  Current Outpatient Medications  Medication Sig Dispense Refill  . albuterol (PROAIR HFA) 108 (90 Base) MCG/ACT inhaler Use 2 puffs every 6 hours  as needed for shortness of  breath 34 g 3  . budesonide (PULMICORT) 0.25 MG/2ML nebulizer solution USE 1 VIAL  IN  NEBULIZER TWICE  DAILY - rinse mouth after treatment 120 mL 1  . carvedilol (COREG) 3.125 MG tablet TAKE 1 TABLET (3.125 MG TOTAL) BY MOUTH 2 (TWO) TIMES DAILY WITH A MEAL. 180 tablet 2  . Cholecalciferol (VITAMIN D3) 5000 UNITS CAPS Take 5,000 Units by mouth daily.     Marland Kitchen docusate sodium (COLACE) 100 MG capsule Take 100 mg by mouth daily.     Marland Kitchen ENTRESTO 97-103 MG TAKE 1 TABLET BY MOUTH TWICE A DAY 180 tablet 1  . ferrous sulfate 325 (65 FE) MG tablet Take 325 mg by mouth 2 (two) times  daily.    . fluticasone furoate-vilanterol (BREO ELLIPTA) 200-25 MCG/INH AEPB Inhale 1 puff into the lungs daily. 28 each 2  . furosemide (LASIX) 40 MG tablet Take 0.5 tablets (20 mg total) by mouth daily. 90 tablet 1  . hydrALAZINE (APRESOLINE) 50 MG tablet TAKE 1 TABLET BY MOUTH THREE TIMES A DAY 270 tablet 2  . Hypromell-Glycerin-Naphazoline (CLEAR EYES FOR DRY EYES PLUS) 0.8-0.25-0.012 % SOLN Place 1-2 drops into both eyes 3 (three) times daily as needed (for dry eyes.).    Marland Kitchen ipratropium-albuterol (DUONEB) 0.5-2.5 (3) MG/3ML SOLN USE 1 VIAL IN NEBULIZER 4 TIMES DAILY 360 mL 1  . levothyroxine (SYNTHROID) 112 MCG tablet TAKE 1 TABLET BY  MOUTH  DAILY BEFORE BREAKFAST 90 tablet 3  . loratadine (CLARITIN) 10 MG tablet Take 10 mg by mouth daily as needed (for allergies.).     Marland Kitchen lovastatin (MEVACOR) 40 MG tablet TAKE 2 TABLETS BY MOUTH AT  BEDTIME 180 tablet 1  . Multiple Vitamins-Minerals (MULTIVITAMINS THER. W/MINERALS) TABS Take 1 tablet by mouth daily.      . nitroGLYCERIN (NITROSTAT) 0.4 MG SL tablet Place 1 tablet (0.4 mg total) under the tongue every 5 (five) minutes as needed for chest pain. 6 tablet 1  . pantoprazole (PROTONIX) 40 MG tablet TAKE 1 TABLET BY MOUTH TWO  TIMES DAILY 180 tablet 1  . spironolactone (ALDACTONE) 25 MG tablet TAKE 1/2 TABLET BY MOUTH EVERY DAY 45 tablet 3  . sucralfate (CARAFATE) 1 g tablet TAKE 1 TABLET BY MOUTH 4  TIMES DAILY - WITH MEALS  AND AT BEDTIME. WITH  BREAKFAST AND SUPPER. 360 tablet 1  . warfarin (COUMADIN) 5 MG tablet TAKE 1 TABLET BY MOUTH  EVERY MONDAY, WEDNESDAY,  AND FRIDAY, AND 1/2 TABLET  THE REMAINING DAYS OF THE  WEEK 30 tablet 6   No current facility-administered medications for this visit.     Review of Systems : See HPI for pertinent positives and negatives.  Physical Examination  Vitals:   05/10/19 1053  BP: (!) 150/57  Pulse: 95  Resp: 12  Temp: (!) 97.4 F (36.3 C)  TempSrc: Temporal  SpO2: 98%  Weight: 165 lb 14.4 oz (75.3  kg)  Height: 5\' 10"  (1.778 m)   Body mass index is 23.8 kg/m.  General: WDWN male in NAD GAIT: normal Eyes: PERRLA HENT: No gross abnormalities.  Pulmonary:  Respirations are non-labored, limited movement in all fields, noo rales, rhonchi, or wheezing. Cardiac: regular rhythm, no detected murmur. Pacemaker/AICD palpated left upper chest  VASCULAR EXAM Carotid Bruits Right Left   Negative Negative     Abdominal aortic pulse is not palpable. Radial pulses are  palpable                                                                                                                        LE Pulses Right Left       POPLITEAL   palpable   not palpable       POSTERIOR TIBIAL  not palpable   faintly palpable        DORSALIS PEDIS      ANTERIOR TIBIAL faintly palpable  not palpable     Gastrointestinal: soft, nontender, BS WNL, no r/g, no palpable masses. Musculoskeletal: no muscle atrophy/wasting. M/S 5/5 throughout, extremities without ischemic changes Skin: No rashes, no ulcers, no cellulitis.   Neurologic:  A&O X 3; appropriate affect, sensation is normal; speech is normal, CN 2-12 intact, pain and light touch intact in extremities, motor exam as listed above. Psychiatric: Normal thought content, mood appropriate to clinical situation.    DATA  Carotid Duplex (05-10-19): Right Carotid: Velocities in the right ICA are consistent  with a 60-79%                stenosis. Non-hemodynamically significant plaque <50% noted in                the CCA. Left Carotid: Velocities in the left ICA are consistent with a 60-79% stenosis.               Non-hemodynamically significant plaque noted in the CCA. Vertebrals:  Bilateral vertebral arteries demonstrate antegrade flow. Subclavians: Normal flow hemodynamics were seen in bilateral subclavian arteries. No change compared to the exams on 03-03-18 and 08-11-18.   Assessment: Braxon R Durflinger is a 78 y.o. male who has no history of  stroke or TIA.   Carotid duplex results reamin stable with 60-79% bilateral ICA stenosis.   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  Fortunately he does not have DM and exercises 4-5 days/week.  He takes warfarin for a hx of atrial fib, takes a daily statin.     Plan: Follow-up in 6 months with Carotid Duplex scan.   I discussed in depth with the patient the nature of atherosclerosis, and emphasized the importance of maximal medical management including strict control of blood pressure, blood glucose, and lipid levels, obtaining regular exercise, and continued cessation of smoking.  The patient is aware that without maximal medical management the underlying atherosclerotic disease process will progress, limiting the benefit of any interventions. The patient was given information about stroke prevention and what symptoms should prompt the patient to seek immediate medical care. Thank you for allowing [place] to participate in this patient's care.  Clemon Chambers, RN, MSN, FNP-C [facility] Office: [phone]  Clinic Physician: Laqueta Due  05/10/19 11:03 AM

## 2019-05-10 NOTE — Patient Instructions (Signed)

## 2019-05-14 ENCOUNTER — Other Ambulatory Visit (HOSPITAL_COMMUNITY): Payer: Self-pay

## 2019-05-14 MED ORDER — HYDRALAZINE HCL 50 MG PO TABS: 50.0000 mg | ORAL_TABLET | Freq: Three times a day (TID) | ORAL | 2 refills | Status: DC

## 2019-05-28 ENCOUNTER — Telehealth: Payer: Self-pay | Admitting: Family Medicine

## 2019-05-28 NOTE — Telephone Encounter (Signed)
Please write that he needs to do so for heart health. Thanks, WS

## 2019-05-28 NOTE — Telephone Encounter (Signed)
Letter ready for pick up.  Patient aware

## 2019-05-29 DIAGNOSIS — J449 Chronic obstructive pulmonary disease, unspecified: Secondary | ICD-10-CM | POA: Diagnosis not present

## 2019-05-29 DIAGNOSIS — J45998 Other asthma: Secondary | ICD-10-CM | POA: Diagnosis not present

## 2019-06-04 ENCOUNTER — Encounter: Payer: Self-pay | Admitting: Family Medicine

## 2019-06-04 ENCOUNTER — Other Ambulatory Visit: Payer: Self-pay

## 2019-06-04 ENCOUNTER — Ambulatory Visit: Payer: Medicare Other | Admitting: Family Medicine

## 2019-06-04 ENCOUNTER — Telehealth: Payer: Self-pay | Admitting: Family Medicine

## 2019-06-04 DIAGNOSIS — R0602 Shortness of breath: Secondary | ICD-10-CM

## 2019-06-04 NOTE — Telephone Encounter (Signed)
Ok to come in

## 2019-06-04 NOTE — Progress Notes (Signed)
    Subjective:    Patient ID: [name] [name], male    DOB: 06-16-1941, 78 y.o.   MRN: [id number]   HPI: [name] [name] is a 78 y.o. male presenting for feet numb and stiff. Real short of breath. Needs oxygen all the time he feels. Would like eval for 24 hour O2. Therefore will need in office appt. Will convert to that and see him at 2:55 PM tomorrow. WS   Depression screen [facility] 2/9 04/11/2019 02/01/2019 10/18/2018 07/18/2018 05/19/2018  Decreased Interest 0 0 0 0 2  Down, Depressed, Hopeless 0 0 0 0 1  PHQ - 2 Score 0 0 0 0 3  Altered sleeping - - - 0 0  Tired, decreased energy - - - 0 1  Change in appetite - - - 0 0  Feeling bad or failure about yourself  - - - 0 0  Trouble concentrating - - - 0 0  Moving slowly or fidgety/restless - - - 0 0  Suicidal thoughts - - - 0 0  PHQ-9 Score - - - 0 4  Some recent data might be hidden     Relevant past medical, surgical, family and social history reviewed and updated as indicated.  Interim medical history since our last visit reviewed. Allergies and medications reviewed and updated.  ROS:  Review of Systems   Social History   Tobacco Use  Smoking Status Former Smoker  . Packs/day: 1.00  . Years: 42.00  . Pack years: 42.00  . Types: Cigarettes  . Quit date: 12/16/1996  . Years since quitting: 22.4  Smokeless Tobacco Never Used       Objective:     Wt Readings from Last 3 Encounters:  05/10/19 165 lb 14.4 oz (75.3 kg)  05/04/19 163 lb 9.6 oz (74.2 kg)  02/01/19 167 lb (75.8 kg)     Exam deferred. Pt. Harboring due to COVID 19. Phone visit performed.   Assessment & Plan:   1. Shortness of breath     No orders of the defined types were placed in this encounter.   No orders of the defined types were placed in this encounter.     Diagnoses and all orders for this visit:  Shortness of breath    Virtual Visit via telephone Note  I discussed the limitations, risks, security and privacy concerns of performing  an evaluation and management service by telephone and the availability of in person appointments. The patient was identified with two identifiers. Pt.expressed understanding and agreed to proceed. Pt. Is at home. Dr. Livia Snellen is in his office.  Follow Up Instructions:   Pt. Needs in office eval. Visit aborted. Rescheduled for office  Total minutes including chart review and phone contact time: 5   Follow up plan: No follow-ups on file.  Claretta Fraise, MD [place]

## 2019-06-05 ENCOUNTER — Ambulatory Visit (INDEPENDENT_AMBULATORY_CARE_PROVIDER_SITE_OTHER): Payer: Medicare Other | Admitting: Family Medicine

## 2019-06-05 ENCOUNTER — Encounter: Payer: Self-pay | Admitting: Family Medicine

## 2019-06-05 VITALS — BP 152/55 | HR 60 | Temp 98.4°F | Ht 70.0 in | Wt 169.0 lb

## 2019-06-05 DIAGNOSIS — Z7901 Long term (current) use of anticoagulants: Secondary | ICD-10-CM | POA: Diagnosis not present

## 2019-06-05 DIAGNOSIS — I482 Chronic atrial fibrillation, unspecified: Secondary | ICD-10-CM | POA: Diagnosis not present

## 2019-06-05 LAB — COAGUCHEK XS/INR WAIVED
INR: 1.9 — ABNORMAL HIGH (ref 0.9–1.1)
Prothrombin Time: 22.7 s

## 2019-06-08 ENCOUNTER — Encounter: Payer: Self-pay | Admitting: Family Medicine

## 2019-06-08 NOTE — Progress Notes (Signed)
Subjective:  Patient ID: [name] [name], male    DOB: [birth year]  Age: 78 y.o. MRN: [id number]  CC: Medical Management of Chronic Issues   HPI [name] [name] presents for continued  Swelling. It has been associated with Dyspnea. Better this morning, but gets worse through the day.   Patient in for follow-up of atrial fibrillation. Patient denies any recent bouts of chest pain or palpitations. Additionally, patient is taking anticoagulants. Patient denies any recent excessive bleeding episodes including epistaxis, bleeding from the gums, genitalia, rectal bleeding or hematuria. Additionally there has been no excessive bruising.  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   He has a past surgical history that includes Coronary angioplasty with stent (~ 2008; 07/08/2014); Coronary artery bypass graft (1998); Hernia repair; Umbilical hernia repair (1970's); left and right heart  catheterization with coronary/graft angiogram (N/A, 07/09/2014); Colonoscopy (2013); Esophagogastroduodenoscopy (2013); Givens capsule study (2013); cholecystomy tube (12/28/2014 - 01/06/2015); Esophagogastroduodenoscopy (N/A, 12/12/2015); Cardiac catheterization (N/A, 07/15/2016); and ICD IMPLANT (N/A, 04/05/2017).   His family history includes Early death in his brother; Heart attack in his brother; Heart disease in his brother and father; Hypertension in an other family member; Kidney disease in his mother; Leukemia in his mother; Stomach cancer in his sister and sister.He reports that he quit smoking about 22 years ago. His smoking use included cigarettes. He has a 42.00 pack-year smoking history. He has never used smokeless tobacco. He reports current alcohol use of about 1.0 standard drinks of alcohol per week. He reports that he does not use drugs.    ROS Review of Systems  Constitutional: Negative for fever.  Respiratory: Positive for shortness of breath.   Cardiovascular: Positive for leg swelling. Negative for chest pain.  Musculoskeletal: Negative for arthralgias.  Skin: Negative for rash.    Objective:  BP (!) 152/55   Pulse 60   Temp 98.4 F (36.9 C) (Oral)   Ht 5\' 10"  (1.778 m)   Wt 169 lb (76.7 kg)   BMI 24.25 kg/m   BP Readings from Last 3 Encounters:  06/05/19 (!) 152/55  05/10/19 (!) 150/57  05/04/19 (!) 120/58    Wt Readings from Last 3 Encounters:  06/05/19 169 lb (76.7 kg)  05/10/19 165 lb 14.4 oz (75.3 kg)  05/04/19 163 lb 9.6 oz (74.2 kg)     Physical Exam Vitals signs reviewed.  Constitutional:      Appearance: He is well-developed.  HENT:     Head: Normocephalic and atraumatic.     Right Ear: Tympanic membrane  and external ear normal. No decreased hearing noted.     Left Ear: Tympanic membrane and external ear normal. No decreased hearing noted.     Mouth/Throat:     Pharynx: No oropharyngeal exudate or posterior oropharyngeal erythema.  Eyes:      Pupils: Pupils are equal, round, and reactive to light.  Neck:     Musculoskeletal: Normal range of motion and neck supple.  Cardiovascular:     Rate and Rhythm: Normal rate and regular rhythm.     Heart sounds: No murmur.  Pulmonary:     Effort: No respiratory distress.     Breath sounds: Normal breath sounds.  Abdominal:     General: Bowel sounds are normal.     Palpations: Abdomen is soft. There is no mass.     Tenderness: There is no abdominal tenderness.  Musculoskeletal:     Right lower leg: Edema (2+) present.     Left lower leg: Edema (2+) present.       Assessment & Plan:   Aubry was seen today for medical management of chronic issues.  Diagnoses and all orders for this visit:  Chronic anticoagulation -     CoaguChek XS/INR Waived  Atrial fibrillation, chronic       I am having Breland R. Simons "[name]" maintain his multivitamins ther. w/minerals, Vitamin D3, ferrous sulfate, docusate sodium, loratadine, nitroGLYCERIN, Hypromell-Glycerin-Naphazoline, albuterol, fluticasone furoate-vilanterol, furosemide, spironolactone, budesonide, ipratropium-albuterol, levothyroxine, lovastatin, Entresto, warfarin, pantoprazole, sucralfate, carvedilol, and hydrALAZINE.  Allergies as of 06/05/2019   No Known Allergies     Medication List       Accurate as of June 05, 2019 11:59 PM. If you have any questions, ask your nurse or doctor.        albuterol 108 (90 Base) MCG/ACT inhaler Commonly known as: ProAir HFA Use 2 puffs every 6 hours  as needed for shortness of  breath   budesonide 0.25 MG/2ML nebulizer solution Commonly known as: PULMICORT USE 1 VIAL  IN  NEBULIZER TWICE  DAILY - rinse mouth after treatment   carvedilol 3.125 MG tablet Commonly known as: COREG TAKE 1 TABLET (3.125 MG TOTAL) BY MOUTH 2 (TWO) TIMES DAILY WITH A MEAL.   Clear Eyes for Dry Eyes Plus 0.8-0.25-0.012 % Soln Generic drug: Hypromell-Glycerin-Naphazoline Place 1-2 drops into both  eyes 3 (three) times daily as needed (for dry eyes.).   docusate sodium 100 MG capsule Commonly known as: COLACE Take 100 mg by mouth daily.   Entresto 97-103 MG Generic drug: sacubitril-valsartan TAKE 1 TABLET BY MOUTH TWICE A DAY   ferrous sulfate 325 (65 FE) MG tablet Take 325 mg by mouth 2 (two) times daily.   fluticasone furoate-vilanterol 200-25 MCG/INH Aepb Commonly known as: BREO ELLIPTA Inhale 1 puff into the lungs daily.   furosemide 40 MG tablet Commonly known as: LASIX Take 0.5 tablets (20 mg total) by mouth daily.   hydrALAZINE 50 MG tablet Commonly known as: APRESOLINE Take 1 tablet (50 mg total) by mouth 3 (three) times daily.   ipratropium-albuterol 0.5-2.5 (3) MG/3ML Soln Commonly known as: DUONEB USE 1 VIAL IN NEBULIZER 4 TIMES DAILY   levothyroxine 112 MCG tablet Commonly known as: SYNTHROID TAKE 1 TABLET BY MOUTH  DAILY BEFORE BREAKFAST   loratadine 10 MG tablet Commonly known as: CLARITIN Take 10 mg by mouth daily as needed (for allergies.).   lovastatin 40 MG tablet Commonly known as: MEVACOR TAKE 2 TABLETS BY MOUTH AT  BEDTIME   multivitamins ther. w/minerals Tabs  tablet Take 1 tablet by mouth daily.   nitroGLYCERIN 0.4 MG SL tablet Commonly known as: NITROSTAT Place 1 tablet (0.4 mg total) under the tongue every 5 (five) minutes as needed for chest pain.   pantoprazole 40 MG tablet Commonly known as: PROTONIX TAKE 1 TABLET BY MOUTH TWO  TIMES DAILY   spironolactone 25 MG tablet Commonly known as: ALDACTONE TAKE 1/2 TABLET BY MOUTH EVERY DAY   sucralfate 1 g tablet Commonly known as: CARAFATE TAKE 1 TABLET BY MOUTH 4  TIMES DAILY - WITH MEALS  AND AT BEDTIME. WITH  BREAKFAST AND SUPPER.   Vitamin D3 125 MCG (5000 UT) Caps Take 5,000 Units by mouth daily.   warfarin 5 MG tablet Commonly known as: COUMADIN Take as directed by the anticoagulation clinic. If you are unsure how to take this medication, talk to your nurse or doctor.  Original instructions: TAKE 1 TABLET BY MOUTH  EVERY MONDAY, WEDNESDAY,  AND FRIDAY, AND 1/2 TABLET  THE REMAINING DAYS OF THE  WEEK        Follow-up: Return in about 1 month (around 07/05/2019).  Claretta Fraise, M.D.

## 2019-06-12 ENCOUNTER — Ambulatory Visit: Payer: Medicare Other | Admitting: Family Medicine

## 2019-06-28 DIAGNOSIS — J45998 Other asthma: Secondary | ICD-10-CM | POA: Diagnosis not present

## 2019-06-28 DIAGNOSIS — J449 Chronic obstructive pulmonary disease, unspecified: Secondary | ICD-10-CM | POA: Diagnosis not present

## 2019-06-29 DIAGNOSIS — J449 Chronic obstructive pulmonary disease, unspecified: Secondary | ICD-10-CM | POA: Diagnosis not present

## 2019-07-05 ENCOUNTER — Ambulatory Visit (INDEPENDENT_AMBULATORY_CARE_PROVIDER_SITE_OTHER): Payer: Medicare Other | Admitting: *Deleted

## 2019-07-05 DIAGNOSIS — I255 Ischemic cardiomyopathy: Secondary | ICD-10-CM | POA: Diagnosis not present

## 2019-07-05 LAB — CUP PACEART REMOTE DEVICE CHECK
Battery Remaining Longevity: 108 mo
Battery Remaining Percentage: 96 %
Brady Statistic RA Percent Paced: 95 %
Brady Statistic RV Percent Paced: 0 %
Date Time Interrogation Session: 20201001050200
HighPow Impedance: 67 Ohm
Implantable Lead Implant Date: 20180703
Implantable Lead Implant Date: 20180703
Implantable Lead Location: 753859
Implantable Lead Location: 753860
Implantable Lead Model: 293
Implantable Lead Model: 3830
Implantable Lead Serial Number: 433180
Implantable Pulse Generator Implant Date: 20180703
Lead Channel Impedance Value: 392 Ohm
Lead Channel Impedance Value: 768 Ohm
Lead Channel Setting Pacing Amplitude: 2.5 V
Lead Channel Setting Pacing Amplitude: 2.5 V
Lead Channel Setting Pacing Pulse Width: 0.4 ms
Lead Channel Setting Sensing Sensitivity: 0.5 mV
Pulse Gen Serial Number: 533886

## 2019-07-11 ENCOUNTER — Encounter: Payer: Self-pay | Admitting: Cardiology

## 2019-07-11 NOTE — Progress Notes (Signed)
Remote ICD transmission.   

## 2019-07-13 ENCOUNTER — Other Ambulatory Visit (HOSPITAL_COMMUNITY): Payer: Self-pay

## 2019-07-13 MED ORDER — HYDRALAZINE HCL 50 MG PO TABS: 50.0000 mg | ORAL_TABLET | Freq: Three times a day (TID) | ORAL | 2 refills | Status: DC

## 2019-07-13 MED ORDER — CARVEDILOL 3.125 MG PO TABS: 3.1250 mg | ORAL_TABLET | Freq: Two times a day (BID) | ORAL | 2 refills | Status: DC

## 2019-07-16 ENCOUNTER — Telehealth: Payer: Self-pay | Admitting: *Deleted

## 2019-07-16 ENCOUNTER — Other Ambulatory Visit: Payer: Self-pay | Admitting: Family Medicine

## 2019-07-16 MED ORDER — SUCRALFATE 1 G PO TABS: ORAL_TABLET | ORAL | 1 refills | Status: DC

## 2019-07-16 NOTE — Telephone Encounter (Signed)
I sent in the requested prescription 

## 2019-07-16 NOTE — Telephone Encounter (Signed)
VM from [facility] Please clarify sig on Sucralfate 1 g tablet and resend

## 2019-07-17 ENCOUNTER — Other Ambulatory Visit: Payer: Self-pay

## 2019-07-18 ENCOUNTER — Ambulatory Visit: Payer: Medicare Other | Admitting: Family Medicine

## 2019-07-18 ENCOUNTER — Ambulatory Visit (INDEPENDENT_AMBULATORY_CARE_PROVIDER_SITE_OTHER): Payer: Medicare Other | Admitting: Family Medicine

## 2019-07-18 ENCOUNTER — Encounter: Payer: Self-pay | Admitting: Family Medicine

## 2019-07-18 VITALS — BP 158/56 | HR 60 | Temp 97.5°F | Resp 16 | Ht 70.0 in | Wt 178.0 lb

## 2019-07-18 DIAGNOSIS — Z23 Encounter for immunization: Secondary | ICD-10-CM | POA: Diagnosis not present

## 2019-07-18 DIAGNOSIS — I482 Chronic atrial fibrillation, unspecified: Secondary | ICD-10-CM | POA: Diagnosis not present

## 2019-07-18 DIAGNOSIS — Z7901 Long term (current) use of anticoagulants: Secondary | ICD-10-CM | POA: Diagnosis not present

## 2019-07-18 LAB — COAGUCHEK XS/INR WAIVED
INR: 1.7 — ABNORMAL HIGH (ref 0.9–1.1)
Prothrombin Time: 20.1 s

## 2019-07-18 MED ORDER — CARVEDILOL 6.25 MG PO TABS: 6.2500 mg | ORAL_TABLET | Freq: Two times a day (BID) | ORAL | 1 refills | Status: DC

## 2019-07-18 NOTE — Progress Notes (Signed)
Subjective:  Patient ID: [name] [name], male    DOB: Jun 26, 1941  Age: 78 y.o. MRN: [id number]  CC: Recheck protime   HPI [name] [name] presents for Patient in for follow-up of atrial fibrillation. Patient denies any recent bouts of chest pain or palpitations. Additionally, patient is taking anticoagulants. Patient denies any recent excessive bleeding episodes including epistaxis, bleeding from the gums, genitalia, rectal bleeding or hematuria. Additionally there has been no excessive bruising.    follow-up of hypertension. Patient has no history of headache chest pain or shortness of breath or recent cough. Patient also denies symptoms of TIA such as numbness weakness lateralizing. Patient checks  blood pressure at home and has not had any elevated readings recently. Patient denies side effects from his medication. States taking it regularly. 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   He has a past surgical history that includes Coronary angioplasty with stent (~ 2008; 07/08/2014); Coronary artery bypass graft (1998); Hernia repair; Umbilical hernia repair (1970's); left and right heart catheterization with coronary/graft angiogram (N/A, 07/09/2014); Colonoscopy (2013); Esophagogastroduodenoscopy (2013); Givens capsule study (2013); cholecystomy tube (12/28/2014 - 01/06/2015); Esophagogastroduodenoscopy (N/A, 12/12/2015); Cardiac catheterization (N/A, 07/15/2016); and ICD IMPLANT (N/A, 04/05/2017).   His family history includes Early death in his brother; Heart attack in his brother; Heart disease in his brother and father; Hypertension in an other family member; Kidney disease in his mother; Leukemia in his mother; Stomach cancer in his sister and sister.He reports that he quit smoking about 22 years ago. His smoking use included cigarettes. He has a 42.00 pack-year smoking history. He has never used smokeless tobacco. He reports current alcohol use of about 1.0 standard drinks of alcohol per week. He reports that he does not use drugs.    ROS Review of Systems  Constitutional: Negative.   HENT: Negative.   Eyes: Negative for visual disturbance.  Respiratory: Negative for cough and shortness of breath.   Cardiovascular: Negative for chest pain and leg swelling.  Gastrointestinal: Negative for abdominal pain, diarrhea, nausea and vomiting.  Genitourinary: Negative for difficulty urinating.  Musculoskeletal: Negative for arthralgias and myalgias.  Skin: Negative for rash.  Neurological: Negative for headaches.  Psychiatric/Behavioral: Negative for sleep disturbance.    Objective:  BP (!) 158/56   Pulse 60   Temp (!) 97.5 F (36.4 C) (Temporal)   Resp 16   Ht 5\' 10"  (1.778 m)   Wt 178 lb (80.7 kg)   SpO2 95%   BMI 25.54 kg/m   BP Readings from  Last 3 Encounters:  07/18/19 (!) 158/56  06/05/19 (!) 152/55  05/10/19 (!) 150/57    Wt Readings from Last 3  Encounters:  07/18/19 178 lb (80.7 kg)  06/05/19 169 lb (76.7 kg)  05/10/19 165 lb 14.4 oz (75.3 kg)     Physical Exam Constitutional:      General: He is not in acute distress.    Appearance: He is well-developed.  HENT:     Head: Normocephalic and atraumatic.     Right Ear: External ear normal.     Left Ear: External ear normal.     Nose: Nose normal.  Eyes:     Conjunctiva/sclera: Conjunctivae normal.     Pupils: Pupils are equal, round, and reactive to light.  Neck:     Musculoskeletal: Normal range of motion and neck supple.  Cardiovascular:     Rate and Rhythm: Normal rate and regular rhythm.     Heart sounds: Normal heart sounds. No murmur.  Pulmonary:     Effort: Pulmonary effort is normal. No respiratory distress.     Breath sounds: Normal breath sounds. No wheezing or rales.  Abdominal:     Palpations: Abdomen is soft.     Tenderness: There is no abdominal tenderness.  Musculoskeletal: Normal range of motion.  Skin:    General: Skin is warm and dry.  Neurological:     Mental Status: He is alert and oriented to person, place, and time.     Deep Tendon Reflexes: Reflexes are normal and symmetric.  Psychiatric:        Behavior: Behavior normal.        Thought Content: Thought content normal.        Judgment: Judgment normal.       Assessment & Plan:   [name] [name] was seen today for recheck protime.  Diagnoses and all orders for this visit:  Chronic anticoagulation -     CoaguChek XS/INR Waived  Need for immunization against influenza -     Flu Vaccine QUAD High Dose(Fluad)  Other orders -     carvedilol (COREG) 6.25 MG tablet; Take 1 tablet (6.25 mg total) by mouth 2 (two) times daily with a meal.       I have changed [name] [name] "[name] [name]"'s carvedilol. I am also having him maintain his multivitamins ther. w/minerals, Vitamin D3, ferrous sulfate, docusate sodium, loratadine, nitroGLYCERIN, Hypromell-Glycerin-Naphazoline, albuterol, fluticasone  furoate-vilanterol, furosemide, spironolactone, budesonide, ipratropium-albuterol, levothyroxine, lovastatin, Entresto, warfarin, pantoprazole, hydrALAZINE, and sucralfate.  Allergies as of 07/18/2019   No Known Allergies     Medication List       Accurate as of July 18, 2019  4:32 PM. If you have any questions, ask your nurse or doctor.        albuterol 108 (90 Base) MCG/ACT inhaler Commonly known as: ProAir HFA Use 2 puffs every 6 hours  as needed for shortness of  breath   budesonide 0.25 MG/2ML nebulizer solution Commonly known as: PULMICORT USE 1 VIAL  IN  NEBULIZER TWICE  DAILY - rinse mouth after treatment   carvedilol 6.25 MG tablet Commonly known as: COREG Take 1 tablet (6.25 mg total) by mouth 2 (two) times daily with a meal. What changed:   medication strength  how much to take Changed by: Claretta Fraise, MD   Clear Eyes for Dry Eyes Plus 0.8-0.25-0.012 % Soln Generic drug: Hypromell-Glycerin-Naphazoline Place 1-2 drops into both eyes 3 (three) times daily as needed (for dry eyes.).   docusate sodium 100 MG  capsule Commonly known as: COLACE Take 100 mg by mouth daily.   Entresto 97-103 MG Generic drug: sacubitril-valsartan TAKE 1 TABLET BY MOUTH TWICE A DAY   ferrous sulfate 325 (65 FE) MG tablet Take 325 mg by mouth 2 (two) times daily.   fluticasone furoate-vilanterol 200-25 MCG/INH Aepb Commonly known as: BREO ELLIPTA Inhale 1 puff into the lungs daily.   furosemide 40 MG tablet Commonly known as: LASIX Take 0.5 tablets (20 mg total) by mouth daily.   hydrALAZINE 50 MG tablet Commonly known as: APRESOLINE Take 1 tablet (50 mg total) by mouth 3 (three) times daily.   ipratropium-albuterol 0.5-2.5 (3) MG/3ML Soln Commonly known as: DUONEB USE 1 VIAL IN NEBULIZER 4 TIMES DAILY   levothyroxine 112 MCG tablet Commonly known as: SYNTHROID TAKE 1 TABLET BY MOUTH  DAILY BEFORE BREAKFAST   loratadine 10 MG tablet Commonly known as: CLARITIN  Take 10 mg by mouth daily as needed (for allergies.).   lovastatin 40 MG tablet Commonly known as: MEVACOR TAKE 2 TABLETS BY MOUTH AT  BEDTIME   multivitamins ther. w/minerals Tabs tablet Take 1 tablet by mouth daily.   nitroGLYCERIN 0.4 MG SL tablet Commonly known as: NITROSTAT Place 1 tablet (0.4 mg total) under the tongue every 5 (five) minutes as needed for chest pain.   pantoprazole 40 MG tablet Commonly known as: PROTONIX TAKE 1 TABLET BY MOUTH TWO  TIMES DAILY   spironolactone 25 MG tablet Commonly known as: ALDACTONE TAKE 1/2 TABLET BY MOUTH EVERY DAY   sucralfate 1 g tablet Commonly known as: CARAFATE TAKE 1 TABLET BY MOUTH 4  TIMES DAILY - one hour before MEALS  AND AT BEDTIME   Vitamin D3 125 MCG (5000 UT) Caps Take 5,000 Units by mouth daily.   warfarin 5 MG tablet Commonly known as: COUMADIN Take as directed by the anticoagulation clinic. If you are unsure how to take this medication, talk to your nurse or doctor. Original instructions: TAKE 1 TABLET BY MOUTH  EVERY MONDAY, WEDNESDAY,  AND FRIDAY, AND 1/2 TABLET  THE REMAINING DAYS OF THE  WEEK        Follow-up: Return in about 6 weeks (around 08/29/2019).  Claretta Fraise, M.D.

## 2019-07-18 NOTE — Patient Instructions (Signed)
I increased your carvedilol as follows:  Take your current supply of 3.125 mg tablets 2 in the morning and 2 in the evening until they have been used up.  Once you have completed this, switch to the new supply of the 6.25 mg tablets.  Take them 1 tablet twice daily until further notice.

## 2019-07-29 DIAGNOSIS — J449 Chronic obstructive pulmonary disease, unspecified: Secondary | ICD-10-CM | POA: Diagnosis not present

## 2019-08-01 DIAGNOSIS — J449 Chronic obstructive pulmonary disease, unspecified: Secondary | ICD-10-CM | POA: Diagnosis not present

## 2019-08-01 DIAGNOSIS — J45998 Other asthma: Secondary | ICD-10-CM | POA: Diagnosis not present

## 2019-08-28 ENCOUNTER — Encounter: Payer: Self-pay | Admitting: Family Medicine

## 2019-08-28 ENCOUNTER — Other Ambulatory Visit: Payer: Self-pay

## 2019-08-28 ENCOUNTER — Ambulatory Visit (INDEPENDENT_AMBULATORY_CARE_PROVIDER_SITE_OTHER): Payer: Medicare Other | Admitting: Family Medicine

## 2019-08-28 VITALS — BP 138/52 | HR 60 | Temp 97.2°F | Ht 70.0 in | Wt 176.0 lb

## 2019-08-28 DIAGNOSIS — Z7901 Long term (current) use of anticoagulants: Secondary | ICD-10-CM | POA: Diagnosis not present

## 2019-08-28 DIAGNOSIS — I482 Chronic atrial fibrillation, unspecified: Secondary | ICD-10-CM | POA: Diagnosis not present

## 2019-08-28 LAB — COAGUCHEK XS/INR WAIVED
INR: 2.1 — ABNORMAL HIGH (ref 0.9–1.1)
Prothrombin Time: 25.1 s

## 2019-08-28 LAB — POCT INR: INR: 2.1 (ref 2.0–3.0)

## 2019-08-28 NOTE — Progress Notes (Signed)
Subjective:  Patient ID: [name] [name], male    DOB: 1940-12-04  Age: 78 y.o. MRN: [id number]  CC: Anticoagulation   HPI [name] [name] presents for Atrial fibrillation follow up. Pt. is treated with rate control and anticoagulation. Pt.  denies palpitations, rapid rate, chest pain, dyspnea and edema. There has been no bleeding from nose or gums. Pt. has not noticed blood with urine or stool.  Although there is routine bruising easily, it is not excessive. PT. Exercising regularly. Walked 4 miles this AM. Marketing executive regularly.  Depression screen [facility] 2/9 08/28/2019 07/18/2019 06/05/2019  Decreased Interest 0 0 0  Down, Depressed, Hopeless 0 0 0  PHQ - 2 Score 0 0 0  Altered sleeping 0 - -  Tired, decreased energy 0 - -  Change in appetite 0 - -  Feeling bad or failure about yourself  0 - -  Trouble concentrating 0 - -  Moving slowly or fidgety/restless 0 - -  Suicidal thoughts 0 - -  PHQ-9 Score 0 - -  Some recent data might be hidden    History [name] [name] has a past medical history of AICD (automatic cardioverter/defibrillator) present (04/05/2017), Anemia, Arthritis, Asthma, Atrial fibrillation ([name] [name]), CAD (coronary artery disease), CHF (congestive heart failure) ([name] [name]), Cholecystitis (11/2013), CKD (chronic kidney disease) (2015), COPD (chronic obstructive pulmonary disease) ([name] [name]), Esophageal reflux, Gallstones, Gastric antral vascular ectasia (2013), Gout, Hepatomegaly, Hiatal hernia, History of blood transfusion (~ 2012), Hyperlipidemia, Hypothyroidism, Other and unspecified coagulation defects, Personal history of colonic polyps (05/29/2010), and Unspecified essential hypertension.   He has a past surgical history that includes Coronary angioplasty with stent (~ 2008; 07/08/2014); Coronary artery bypass graft (1998); Hernia repair; Umbilical hernia repair (1970's); left and right heart catheterization with coronary/graft angiogram (N/A, 07/09/2014); Colonoscopy (2013);  Esophagogastroduodenoscopy (2013); Givens capsule study (2013); cholecystomy tube (12/28/2014 - 01/06/2015); Esophagogastroduodenoscopy (N/A, 12/12/2015); Cardiac catheterization (N/A, 07/15/2016); and ICD IMPLANT (N/A, 04/05/2017).   His family history includes Early death in his brother; Heart attack in his brother; Heart disease in his brother and father; Hypertension in an other family member; Kidney disease in his mother; Leukemia in his mother; Stomach cancer in his sister and sister.He reports that he quit smoking about 22 years ago. His smoking use included cigarettes. He has a 42.00 pack-year smoking history. He has never used smokeless tobacco. He reports current alcohol use of about 1.0 standard drinks of alcohol per week. He reports that he does not use drugs.    ROS Review of Systems  Constitutional: Negative for fever.  Respiratory: Positive for shortness of breath (at baseline).   Cardiovascular: Negative for chest pain.  Musculoskeletal: Negative for arthralgias.  Skin: Negative for rash.    Objective:  BP (!) 138/52   Pulse 60   Temp (!) 97.2 F (36.2 C) (Temporal)   Ht 5\' 10"  (1.778 m)   Wt 176 lb (79.8 kg)   SpO2 96%   BMI 25.25 kg/m   BP Readings from Last 3 Encounters:  08/28/19 (!) 138/52  07/18/19 (!) 158/56  06/05/19 (!) 152/55    Wt Readings from Last 3 Encounters:  08/28/19 176 lb (79.8 kg)  07/18/19 178 lb (80.7 kg)  06/05/19 169 lb (76.7 kg)     Physical Exam Vitals signs reviewed.  Constitutional:      Appearance: He is well-developed.  HENT:     Head: Normocephalic and atraumatic.     Right Ear: External ear normal.     Left Ear: External ear  normal.     Mouth/Throat:     Pharynx: No oropharyngeal exudate or posterior oropharyngeal erythema.  Eyes:     Pupils: Pupils are equal, round, and reactive to light.  Neck:     Musculoskeletal: Normal range of motion and neck supple.  Cardiovascular:     Rate and Rhythm: Normal rate and regular  rhythm.     Heart sounds: No murmur.  Pulmonary:     Effort: No respiratory distress.     Breath sounds: Normal breath sounds.  Neurological:     Mental Status: He is alert and oriented to person, place, and time.       Assessment & Plan:   [name] [name] was seen today for anticoagulation.  Diagnoses and all orders for this visit:  Chronic anticoagulation -     inr FINGERSTICK  Atrial fibrillation, chronic ([name] [name])  Other orders -     POCT INR       I am having [name] R. Trim "[name]" maintain his multivitamins ther. w/minerals, Vitamin D3, ferrous sulfate, docusate sodium, loratadine, nitroGLYCERIN, Hypromell-Glycerin-Naphazoline, albuterol, fluticasone furoate-vilanterol, furosemide, spironolactone, budesonide, ipratropium-albuterol, levothyroxine, lovastatin, Entresto, warfarin, pantoprazole, hydrALAZINE, sucralfate, and carvedilol.  Allergies as of 08/28/2019   No Known Allergies     Medication List       Accurate as of August 28, 2019 10:46 AM. If you have any questions, ask your nurse or doctor.        albuterol 108 (90 Base) MCG/ACT inhaler Commonly known as: ProAir HFA Use 2 puffs every 6 hours  as needed for shortness of  breath   budesonide 0.25 MG/2ML nebulizer solution Commonly known as: PULMICORT USE 1 VIAL  IN  NEBULIZER TWICE  DAILY - rinse mouth after treatment   carvedilol 6.25 MG tablet Commonly known as: COREG Take 1 tablet (6.25 mg total) by mouth 2 (two) times daily with a meal.   Clear Eyes for Dry Eyes Plus 0.8-0.25-0.012 % Soln Generic drug: Hypromell-Glycerin-Naphazoline Place 1-2 drops into both eyes 3 (three) times daily as needed (for dry eyes.).   docusate sodium 100 MG capsule Commonly known as: COLACE Take 100 mg by mouth daily.   Entresto 97-103 MG Generic drug: sacubitril-valsartan TAKE 1 TABLET BY MOUTH TWICE A DAY   ferrous sulfate 325 (65 FE) MG tablet Take 325 mg by mouth 2 (two) times daily.   fluticasone  furoate-vilanterol 200-25 MCG/INH Aepb Commonly known as: BREO ELLIPTA Inhale 1 puff into the lungs daily.   furosemide 40 MG tablet Commonly known as: LASIX Take 0.5 tablets (20 mg total) by mouth daily.   hydrALAZINE 50 MG tablet Commonly known as: APRESOLINE Take 1 tablet (50 mg total) by mouth 3 (three) times daily.   ipratropium-albuterol 0.5-2.5 (3) MG/3ML Soln Commonly known as: DUONEB USE 1 VIAL IN NEBULIZER 4 TIMES DAILY   levothyroxine 112 MCG tablet Commonly known as: SYNTHROID TAKE 1 TABLET BY MOUTH  DAILY BEFORE BREAKFAST   loratadine 10 MG tablet Commonly known as: CLARITIN Take 10 mg by mouth daily as needed (for allergies.).   lovastatin 40 MG tablet Commonly known as: MEVACOR TAKE 2 TABLETS BY MOUTH AT  BEDTIME   multivitamins ther. w/minerals Tabs tablet Take 1 tablet by mouth daily.   nitroGLYCERIN 0.4 MG SL tablet Commonly known as: NITROSTAT Place 1 tablet (0.4 mg total) under the tongue every 5 (five) minutes as needed for chest pain.   pantoprazole 40 MG tablet Commonly known as: PROTONIX TAKE 1 TABLET BY MOUTH TWO  TIMES  DAILY   spironolactone 25 MG tablet Commonly known as: ALDACTONE TAKE 1/2 TABLET BY MOUTH EVERY DAY   sucralfate 1 g tablet Commonly known as: CARAFATE TAKE 1 TABLET BY MOUTH 4  TIMES DAILY - one hour before MEALS  AND AT BEDTIME   Vitamin D3 125 MCG (5000 UT) Caps Take 5,000 Units by mouth daily.   warfarin 5 MG tablet Commonly known as: COUMADIN Take as directed by the anticoagulation clinic. If you are unsure how to take this medication, talk to your nurse or doctor. Original instructions: TAKE 1 TABLET BY MOUTH  EVERY MONDAY, WEDNESDAY,  AND FRIDAY, AND 1/2 TABLET  THE REMAINING DAYS OF THE  WEEK      Description   Continue taking 1 tablet on Mondays and Fridays and 1/2 tablet all other days of the week.    Goal is 1.8-2.2  INR 2.1 today  Perfect reading            Follow-up: No follow-ups on file.   Claretta Fraise, M.D.

## 2019-08-29 DIAGNOSIS — J45998 Other asthma: Secondary | ICD-10-CM | POA: Diagnosis not present

## 2019-08-29 DIAGNOSIS — J449 Chronic obstructive pulmonary disease, unspecified: Secondary | ICD-10-CM | POA: Diagnosis not present

## 2019-08-31 ENCOUNTER — Other Ambulatory Visit: Payer: Self-pay | Admitting: Family Medicine

## 2019-08-31 ENCOUNTER — Other Ambulatory Visit (HOSPITAL_COMMUNITY): Payer: Self-pay | Admitting: Cardiology

## 2019-09-04 ENCOUNTER — Other Ambulatory Visit (HOSPITAL_COMMUNITY): Payer: Self-pay | Admitting: Cardiology

## 2019-09-24 DIAGNOSIS — J449 Chronic obstructive pulmonary disease, unspecified: Secondary | ICD-10-CM | POA: Diagnosis not present

## 2019-09-24 DIAGNOSIS — J45998 Other asthma: Secondary | ICD-10-CM | POA: Diagnosis not present

## 2019-09-28 DIAGNOSIS — J449 Chronic obstructive pulmonary disease, unspecified: Secondary | ICD-10-CM | POA: Diagnosis not present

## 2019-10-04 ENCOUNTER — Ambulatory Visit (INDEPENDENT_AMBULATORY_CARE_PROVIDER_SITE_OTHER): Payer: Medicare Other | Admitting: *Deleted

## 2019-10-04 DIAGNOSIS — Z95810 Presence of automatic (implantable) cardiac defibrillator: Secondary | ICD-10-CM | POA: Diagnosis not present

## 2019-10-04 LAB — CUP PACEART REMOTE DEVICE CHECK
Battery Remaining Longevity: 102 mo
Battery Remaining Percentage: 93 %
Brady Statistic RA Percent Paced: 96 %
Brady Statistic RV Percent Paced: 0 %
Date Time Interrogation Session: 20201231011400
HighPow Impedance: 67 Ohm
Implantable Lead Implant Date: 20180703
Implantable Lead Implant Date: 20180703
Implantable Lead Location: 753859
Implantable Lead Location: 753860
Implantable Lead Model: 293
Implantable Lead Model: 3830
Implantable Lead Serial Number: 433180
Implantable Pulse Generator Implant Date: 20180703
Lead Channel Impedance Value: 393 Ohm
Lead Channel Impedance Value: 817 Ohm
Lead Channel Setting Pacing Amplitude: 2.5 V
Lead Channel Setting Pacing Amplitude: 2.5 V
Lead Channel Setting Pacing Pulse Width: 0.4 ms
Lead Channel Setting Sensing Sensitivity: 0.5 mV
Pulse Gen Serial Number: 533886

## 2019-10-08 ENCOUNTER — Other Ambulatory Visit: Payer: Self-pay | Admitting: Family Medicine

## 2019-10-10 ENCOUNTER — Other Ambulatory Visit: Payer: Self-pay

## 2019-10-11 ENCOUNTER — Ambulatory Visit (INDEPENDENT_AMBULATORY_CARE_PROVIDER_SITE_OTHER): Payer: Medicare Other | Admitting: Family Medicine

## 2019-10-11 ENCOUNTER — Other Ambulatory Visit: Payer: Self-pay

## 2019-10-11 ENCOUNTER — Encounter: Payer: Self-pay | Admitting: Family Medicine

## 2019-10-11 VITALS — BP 110/45 | HR 60 | Temp 97.0°F | Resp 20 | Ht 70.0 in | Wt 175.0 lb

## 2019-10-11 DIAGNOSIS — E038 Other specified hypothyroidism: Secondary | ICD-10-CM

## 2019-10-11 DIAGNOSIS — I482 Chronic atrial fibrillation, unspecified: Secondary | ICD-10-CM | POA: Diagnosis not present

## 2019-10-11 DIAGNOSIS — E782 Mixed hyperlipidemia: Secondary | ICD-10-CM | POA: Diagnosis not present

## 2019-10-11 DIAGNOSIS — I10 Essential (primary) hypertension: Secondary | ICD-10-CM | POA: Diagnosis not present

## 2019-10-11 LAB — COAGUCHEK XS/INR WAIVED
INR: 1.6 — ABNORMAL HIGH (ref 0.9–1.1)
Prothrombin Time: 18.9 s

## 2019-10-11 NOTE — Progress Notes (Signed)
Subjective:  Patient ID: [name] [name], male    DOB: 1940/11/09  Age: 79 y.o. MRN: [id number]  CC: Medical Management of Chronic Issues (6 mo), Hyperlipidemia, Hypertension, Hypothyroidism, and Anticoagulation (6 week)   HPI [name] [name] presents for  follow-up on  thyroid. The patient has a history of hypothyroidism for many years. It has been stable recently. Pt. denies any change in  voice, loss of hair, heat or cold intolerance. Energy level has been adequate to good. Patient denies constipation and diarrhea. No myxedema. Medication is as noted below. Verified that pt is taking it daily on an empty stomach. Well tolerated.  presents for  follow-up of hypertension. Patient has no history of headache chest pain or shortness of breath or recent cough. Patient also denies symptoms of TIA such as focal numbness or weakness. Patient denies side effects from medication. States taking it regularly.  in for follow-up of elevated cholesterol. Doing well without complaints on current medication. Denies side effects of statin including myalgia and arthralgia and nausea. Currently no chest pain, shortness of breath or other cardiovascular related symptoms noted. Atrial fibrillation follow up. Pt. is treated with rate control and anticoagulation. Pt.  denies palpitations, rapid rate, chest pain, dyspnea and edema. There has been no bleeding from nose or gums. Pt. has not noticed blood with urine or stool.  Although there is routine bruising easily, it is not excessive. His congestive heart failure has not been active recently. He has some dyspnea as a baaseline, but not recent exacerbation.   Depression screen [facility] 2/9 10/11/2019 08/28/2019 07/18/2019  Decreased Interest 0 0 0  Down, Depressed, Hopeless 0 0 0  PHQ - 2 Score 0 0 0  Altered sleeping - 0 -  Tired, decreased energy - 0 -  Change in appetite - 0 -  Feeling bad or failure about yourself  - 0 -  Trouble concentrating - 0 -  Moving slowly or  fidgety/restless - 0 -  Suicidal thoughts - 0 -  PHQ-9 Score - 0 -  Some recent data might be hidden    History [name] [name] has a past medical history of AICD (automatic cardioverter/defibrillator) present (04/05/2017), Anemia, Arthritis, Asthma, Atrial fibrillation (HCC), CAD (coronary artery disease), CHF (congestive heart failure) ([place]), Cholecystitis (11/2013), CKD (chronic kidney disease) (2015), COPD (chronic obstructive pulmonary disease) ([place]), Esophageal reflux, Gallstones, Gastric antral vascular ectasia (2013), Gout, Hepatomegaly, Hiatal hernia, History of blood transfusion (~ 2012), Hyperlipidemia, Hypothyroidism, Other and unspecified coagulation defects, Personal history of colonic polyps (05/29/2010), and Unspecified essential hypertension.   He has a past surgical history that includes Coronary angioplasty with stent (~ 2008; 07/08/2014); Coronary artery bypass graft (1998); Hernia repair; Umbilical hernia repair (1970's); left and right heart catheterization with coronary/graft angiogram (N/A, 07/09/2014); Colonoscopy (2013); Esophagogastroduodenoscopy (2013); Givens capsule study (2013); cholecystomy tube (12/28/2014 - 01/06/2015); Esophagogastroduodenoscopy (N/A, 12/12/2015); Cardiac catheterization (N/A, 07/15/2016); and ICD IMPLANT (N/A, 04/05/2017).   His family history includes Early death in his brother; Heart attack in his brother; Heart disease in his brother and father; Hypertension in an other family member; Kidney disease in his mother; Leukemia in his mother; Stomach cancer in his sister and sister.He reports that he quit smoking about 22 years ago. His smoking use included cigarettes. He has a 42.00 pack-year smoking history. He has never used smokeless tobacco. He reports current alcohol use of about 1.0 standard drinks of alcohol per week. He reports that he does not use drugs.    ROS Review  of Systems  Constitutional: Negative.   HENT: Negative.   Eyes: Negative for visual  disturbance.  Respiratory: Positive for shortness of breath (baseline). Negative for cough.   Cardiovascular: Negative for chest pain and leg swelling.  Gastrointestinal: Negative for abdominal pain, diarrhea, nausea and vomiting.  Genitourinary: Negative for difficulty urinating.  Musculoskeletal: Negative for arthralgias and myalgias.  Skin: Negative for rash.  Neurological: Negative for headaches.  Psychiatric/Behavioral: Negative for sleep disturbance.    Objective:  BP (!) 110/45   Pulse 60   Temp (!) 97 F (36.1 C)   Resp 20   Ht _0  (1.778 m)   Wt 175 lb (79.4 kg)   SpO2 97%   BMI 25.11 kg/m   BP Readings from Last 3 Encounters:  10/11/19 (!) 110/45  08/28/19 (!) 138/52  07/18/19 (!) 158/56    Wt Readings from Last 3 Encounters:  10/11/19 175 lb (79.4 kg)  08/28/19 176 lb (79.8 kg)  07/18/19 178 lb (80.7 kg)     Physical Exam Constitutional:      General: He is not in acute distress.    Appearance: He is well-developed.  HENT:     Head: Normocephalic and atraumatic.     Right Ear: External ear normal.     Left Ear: External ear normal.     Nose: Nose normal.  Eyes:     Conjunctiva/sclera: Conjunctivae normal.     Pupils: Pupils are equal, round, and reactive to light.  Cardiovascular:     Rate and Rhythm: Normal rate and regular rhythm.     Heart sounds: Murmur present. Systolic murmur present with a grade of 2/6.  Pulmonary:     Effort: Pulmonary effort is normal. No respiratory distress.     Breath sounds: Normal breath sounds. No wheezing or rales.  Abdominal:     Palpations: Abdomen is soft.     Tenderness: There is no abdominal tenderness.  Musculoskeletal:        General: Normal range of motion.     Cervical back: Normal range of motion and neck supple.  Skin:    General: Skin is warm and dry.  Neurological:     Mental Status: He is alert and oriented to person, place, and time.     Deep Tendon Reflexes: Reflexes are normal and  symmetric.  Psychiatric:        Behavior: Behavior normal.        Thought Content: Thought content normal.        Judgment: Judgment normal.    Results for orders placed or performed in visit on 10/11/19  CBC with Differential/Platelet  Result Value Ref Range   WBC 9.6 3.4 - 10.8 x10E3/uL   RBC 3.47 (L) 4.14 - 5.80 x10E6/uL   Hemoglobin 11.1 (L) 13.0 - 17.7 g/dL   Hematocrit 34.0 (L) 37.5 - 51.0 %   MCV 98 (H) 79 - 97 fL   MCH 32.0 26.6 - 33.0 pg   MCHC 32.6 31.5 - 35.7 g/dL   RDW 12.3 11.6 - 15.4 %   Platelets 256 150 - 450 x10E3/uL   Neutrophils 74 Not Estab. %   Lymphs 14 Not Estab. %   Monocytes 8 Not Estab. %   Eos 3 Not Estab. %   Basos 1 Not Estab. %   Neutrophils Absolute 7.1 (H) 1.4 - 7.0 x10E3/uL   Lymphocytes Absolute 1.3 0.7 - 3.1 x10E3/uL   Monocytes Absolute 0.8 0.1 - 0.9 x10E3/uL   EOS (ABSOLUTE) 0.3  0.0 - 0.4 x10E3/uL   Basophils Absolute 0.1 0.0 - 0.2 x10E3/uL   Immature Granulocytes 0 Not Estab. %   Immature Grans (Abs) 0.0 0.0 - 0.1 x10E3/uL  CMP14+EGFR  Result Value Ref Range   Glucose 131 (H) 65 - 99 mg/dL   BUN 28 (H) 8 - 27 mg/dL   Creatinine, Ser 1.99 (H) 0.76 - 1.27 mg/dL   GFR calc non Af Amer 31 (L) >59 mL/min/1.73   GFR calc Af Amer 36 (L) >59 mL/min/1.73   BUN/Creatinine Ratio 14 10 - 24   Sodium 142 134 - 144 mmol/L   Potassium 4.2 3.5 - 5.2 mmol/L   Chloride 103 96 - 106 mmol/L   CO2 25 20 - 29 mmol/L   Calcium 9.0 8.6 - 10.2 mg/dL   Total Protein 6.2 6.0 - 8.5 g/dL   Albumin 4.2 3.7 - 4.7 g/dL   Globulin, Total 2.0 1.5 - 4.5 g/dL   Albumin/Globulin Ratio 2.1 1.2 - 2.2   Bilirubin Total 0.5 0.0 - 1.2 mg/dL   Alkaline Phosphatase 56 39 - 117 IU/L   AST 21 0 - 40 IU/L   ALT 12 0 - 44 IU/L  Lipid panel  Result Value Ref Range   Cholesterol, Total 103 100 - 199 mg/dL   Triglycerides 90 0 - 149 mg/dL   HDL 42 >39 mg/dL   VLDL Cholesterol Cal 18 5 - 40 mg/dL   LDL Chol Calc (NIH) 43 0 - 99 mg/dL   Chol/HDL Ratio 2.5 0.0 - 5.0 ratio    inr FINGERSTICK  Result Value Ref Range   INR 1.6 (H) 0.9 - 1.1   Prothrombin Time 18.9 sec  Thyroid Panel With TSH  Result Value Ref Range   TSH 2.740 0.450 - 4.500 uIU/mL   T4, Total 8.4 4.5 - 12.0 ug/dL   T3 Uptake Ratio 30 24 - 39 %   Free Thyroxine Index 2.5 1.2 - 4.9  T4, Free  Result Value Ref Range   Free T4 1.50 0.82 - 1.77 ng/dL      Assessment & Plan:   [name] [name] was seen today for medical management of chronic issues, hyperlipidemia, hypertension, hypothyroidism and anticoagulation.  Diagnoses and all orders for this visit:  Atrial fibrillation, chronic (HCC) -     CBC with Differential/Platelet -     inr FINGERSTICK  Essential hypertension -     CBC with Differential/Platelet -     CMP14+EGFR  Other specified hypothyroidism -     CBC with Differential/Platelet -     Thyroid Panel With TSH -     T4, Free  Mixed hyperlipidemia -     CBC with Differential/Platelet -     Lipid panel  Pt. Appears to be stable in spite of multiple illnesses that tend to exacerbate each other. He has been Inp precarious shape in the past as a result, but curently managing very well. Although somewhat low his blood pressure is in range. His CHF has been doing well since adding entresto. His  atrial fibrillation is controlled by rate today. There is minimal irregularity of   Beat on auscultation.      I am having [name] R. Goens "[name]" maintain his multivitamins ther. w/minerals, Vitamin D3, ferrous sulfate, docusate sodium, loratadine, nitroGLYCERIN, Hypromell-Glycerin-Naphazoline, fluticasone furoate-vilanterol, furosemide, spironolactone, budesonide, ipratropium-albuterol, levothyroxine, warfarin, pantoprazole, hydrALAZINE, sucralfate, carvedilol, lovastatin, Entresto, and albuterol.  Allergies as of 10/11/2019   No Known Allergies     Medication List  Accurate as of October 11, 2019  9:14 AM. If you have any questions, ask your nurse or doctor.        albuterol  108 (90 Base) MCG/ACT inhaler Commonly known as: VENTOLIN HFA USE 2 INHALATIONS BY MOUTH  EVERY 6 HOURS AS NEEDED FOR SHORTNESS OF BREATH   budesonide 0.25 MG/2ML nebulizer solution Commonly known as: PULMICORT USE 1 VIAL  IN  NEBULIZER TWICE  DAILY - rinse mouth after treatment   carvedilol 6.25 MG tablet Commonly known as: COREG Take 1 tablet (6.25 mg total) by mouth 2 (two) times daily with a meal.   Clear Eyes for Dry Eyes Plus 0.8-0.25-0.012 % Soln Generic drug: Hypromell-Glycerin-Naphazoline Place 1-2 drops into both eyes 3 (three) times daily as needed (for dry eyes.).   docusate sodium 100 MG capsule Commonly known as: COLACE Take 100 mg by mouth daily.   Entresto 97-103 MG Generic drug: sacubitril-valsartan TAKE 1 TABLET BY MOUTH TWICE A DAY   ferrous sulfate 325 (65 FE) MG tablet Take 325 mg by mouth 2 (two) times daily.   fluticasone furoate-vilanterol 200-25 MCG/INH Aepb Commonly known as: BREO ELLIPTA Inhale 1 puff into the lungs daily.   furosemide 40 MG tablet Commonly known as: LASIX Take 0.5 tablets (20 mg total) by mouth daily.   hydrALAZINE 50 MG tablet Commonly known as: APRESOLINE Take 1 tablet (50 mg total) by mouth 3 (three) times daily.   ipratropium-albuterol 0.5-2.5 (3) MG/3ML Soln Commonly known as: DUONEB USE 1 VIAL IN NEBULIZER 4 TIMES DAILY   levothyroxine 112 MCG tablet Commonly known as: SYNTHROID TAKE 1 TABLET BY MOUTH  DAILY BEFORE BREAKFAST   loratadine 10 MG tablet Commonly known as: CLARITIN Take 10 mg by mouth daily as needed (for allergies.).   lovastatin 40 MG tablet Commonly known as: MEVACOR TAKE 2 TABLETS BY MOUTH AT  BEDTIME   multivitamins ther. w/minerals Tabs tablet Take 1 tablet by mouth daily.   nitroGLYCERIN 0.4 MG SL tablet Commonly known as: NITROSTAT Place 1 tablet (0.4 mg total) under the tongue every 5 (five) minutes as needed for chest pain.   pantoprazole 40 MG tablet Commonly known as:  PROTONIX TAKE 1 TABLET BY MOUTH TWO  TIMES DAILY   spironolactone 25 MG tablet Commonly known as: ALDACTONE TAKE 1/2 TABLET BY MOUTH EVERY DAY   sucralfate 1 g tablet Commonly known as: CARAFATE TAKE 1 TABLET BY MOUTH 4  TIMES DAILY - one hour before MEALS  AND AT BEDTIME   Vitamin D3 125 MCG (5000 UT) Caps Take 5,000 Units by mouth daily.   warfarin 5 MG tablet Commonly known as: COUMADIN Take as directed by the anticoagulation clinic. If you are unsure how to take this medication, talk to your nurse or doctor. Original instructions: TAKE 1 TABLET BY MOUTH  EVERY MONDAY, WEDNESDAY,  AND FRIDAY, AND 1/2 TABLET  THE REMAINING DAYS OF THE  WEEK        Follow-up: No follow-ups on file.  Claretta Fraise, M.D.

## 2019-10-12 LAB — THYROID PANEL WITH TSH
Free Thyroxine Index: 2.5 (ref 1.2–4.9)
T3 Uptake Ratio: 30 % (ref 24–39)
T4, Total: 8.4 ug/dL (ref 4.5–12.0)
TSH: 2.74 u[IU]/mL (ref 0.450–4.500)

## 2019-10-12 LAB — T4, FREE: Free T4: 1.5 ng/dL (ref 0.82–1.77)

## 2019-10-12 LAB — CMP14+EGFR
ALT: 12 IU/L (ref 0–44)
AST: 21 IU/L (ref 0–40)
Albumin/Globulin Ratio: 2.1 (ref 1.2–2.2)
Albumin: 4.2 g/dL (ref 3.7–4.7)
Alkaline Phosphatase: 56 IU/L (ref 39–117)
BUN/Creatinine Ratio: 14 (ref 10–24)
BUN: 28 mg/dL — ABNORMAL HIGH (ref 8–27)
Bilirubin Total: 0.5 mg/dL (ref 0.0–1.2)
CO2: 25 mmol/L (ref 20–29)
Calcium: 9 mg/dL (ref 8.6–10.2)
Chloride: 103 mmol/L (ref 96–106)
Creatinine, Ser: 1.99 mg/dL — ABNORMAL HIGH (ref 0.76–1.27)
GFR calc Af Amer: 36 mL/min/{1.73_m2} — ABNORMAL LOW (ref >59)
GFR calc non Af Amer: 31 mL/min/{1.73_m2} — ABNORMAL LOW (ref >59)
Globulin, Total: 2 g/dL (ref 1.5–4.5)
Glucose: 131 mg/dL — ABNORMAL HIGH (ref 65–99)
Potassium: 4.2 mmol/L (ref 3.5–5.2)
Sodium: 142 mmol/L (ref 134–144)
Total Protein: 6.2 g/dL (ref 6.0–8.5)

## 2019-10-12 LAB — CBC WITH DIFFERENTIAL/PLATELET
Basophils Absolute: 0.1 10*3/uL (ref 0.0–0.2)
Basos: 1 %
EOS (ABSOLUTE): 0.3 10*3/uL (ref 0.0–0.4)
Eos: 3 %
Hematocrit: 34 % — ABNORMAL LOW (ref 37.5–51.0)
Hemoglobin: 11.1 g/dL — ABNORMAL LOW (ref 13.0–17.7)
Immature Grans (Abs): 0 10*3/uL (ref 0.0–0.1)
Immature Granulocytes: 0 %
Lymphocytes Absolute: 1.3 10*3/uL (ref 0.7–3.1)
Lymphs: 14 %
MCH: 32 pg (ref 26.6–33.0)
MCHC: 32.6 g/dL (ref 31.5–35.7)
MCV: 98 fL — ABNORMAL HIGH (ref 79–97)
Monocytes Absolute: 0.8 10*3/uL (ref 0.1–0.9)
Monocytes: 8 %
Neutrophils Absolute: 7.1 10*3/uL — ABNORMAL HIGH (ref 1.4–7.0)
Neutrophils: 74 %
Platelets: 256 10*3/uL (ref 150–450)
RBC: 3.47 x10E6/uL — ABNORMAL LOW (ref 4.14–5.80)
RDW: 12.3 % (ref 11.6–15.4)
WBC: 9.6 10*3/uL (ref 3.4–10.8)

## 2019-10-12 LAB — LIPID PANEL
Chol/HDL Ratio: 2.5 ratio (ref 0.0–5.0)
Cholesterol, Total: 103 mg/dL (ref 100–199)
HDL: 42 mg/dL (ref >39)
LDL Chol Calc (NIH): 43 mg/dL (ref 0–99)
Triglycerides: 90 mg/dL (ref 0–149)
VLDL Cholesterol Cal: 18 mg/dL (ref 5–40)

## 2019-10-14 ENCOUNTER — Encounter: Payer: Self-pay | Admitting: Family Medicine

## 2019-10-14 MED ORDER — FUROSEMIDE 40 MG PO TABS: 20.0000 mg | ORAL_TABLET | Freq: Every day | ORAL | 1 refills | Status: DC

## 2019-10-14 MED ORDER — IPRATROPIUM-ALBUTEROL 0.5-2.5 (3) MG/3ML IN SOLN: RESPIRATORY_TRACT | 1 refills | Status: DC

## 2019-10-24 ENCOUNTER — Ambulatory Visit: Payer: Medicare Other | Attending: Internal Medicine

## 2019-10-24 DIAGNOSIS — Z23 Encounter for immunization: Secondary | ICD-10-CM | POA: Insufficient documentation

## 2019-10-24 NOTE — Progress Notes (Signed)
   Covid-19 Vaccination Clinic  Name:  CASHIS RILL    MRN: [id number] DOB: 08/06/1941  10/24/2019  Mr. Kohles was observed post Covid-19 immunization for 15 minutes without incidence. He was provided with Vaccine Information Sheet and instruction to access the V-Safe system.   Mr. Litle was instructed to call 911 with any severe reactions post vaccine: Marland Kitchen Difficulty breathing  . Swelling of your face and throat  . A fast heartbeat  . A bad rash all over your body  . Dizziness and weakness    Immunizations Administered    Name Date Dose VIS Date Route   Pfizer COVID-19 Vaccine 10/24/2019  3:41 PM 0.3 mL 09/14/2019 Intramuscular   Manufacturer: [place]   Lot: [id number]   [place]: [id number]

## 2019-10-29 DIAGNOSIS — J449 Chronic obstructive pulmonary disease, unspecified: Secondary | ICD-10-CM | POA: Diagnosis not present

## 2019-11-11 ENCOUNTER — Ambulatory Visit: Payer: Medicare Other | Attending: Internal Medicine

## 2019-11-11 DIAGNOSIS — Z23 Encounter for immunization: Secondary | ICD-10-CM

## 2019-11-11 NOTE — Progress Notes (Signed)
   Covid-19 Vaccination Clinic  Name:  CECIL VANDYKE    MRN: [id number] DOB: 04-Aug-1941  11/11/2019  [name] [name] was observed post Covid-19 immunization for 15 minutes without incidence. He was provided with Vaccine Information Sheet and instruction to access the V-Safe system.   [name] [name] was instructed to call 911 with any severe reactions post vaccine: Marland Kitchen Difficulty breathing  . Swelling of your face and throat  . A fast heartbeat  . A bad rash all over your body  . Dizziness and weakness    Immunizations Administered    Name Date Dose VIS Date Route   Pfizer COVID-19 Vaccine 11/11/2019  8:14 AM 0.3 mL 09/14/2019 Intramuscular   Manufacturer: [place]   Lot: [id number]   [place]: [id number]

## 2019-11-13 ENCOUNTER — Other Ambulatory Visit: Payer: Self-pay | Admitting: Family Medicine

## 2019-11-14 ENCOUNTER — Other Ambulatory Visit: Payer: Self-pay | Admitting: *Deleted

## 2019-11-14 MED ORDER — CARVEDILOL 6.25 MG PO TABS: 6.2500 mg | ORAL_TABLET | Freq: Two times a day (BID) | ORAL | 1 refills | Status: DC

## 2019-11-16 ENCOUNTER — Other Ambulatory Visit: Payer: Self-pay

## 2019-11-16 MED ORDER — FUROSEMIDE 40 MG PO TABS: 20.0000 mg | ORAL_TABLET | Freq: Every day | ORAL | 1 refills | Status: DC

## 2019-11-22 ENCOUNTER — Ambulatory Visit: Payer: Medicare Other | Admitting: Family Medicine

## 2019-11-27 ENCOUNTER — Telehealth: Payer: Self-pay | Admitting: Family Medicine

## 2019-11-27 NOTE — Chronic Care Management (AMB) (Signed)
  Chronic Care Management   Note  11/27/2019 Name: [name] [name] MRN: [id number] DOB: 03/16/1941  [name] is a 79 y.o. year old male who is a primary care patient of Stacks, [name], MD. I reached out to [employer] by phone today in response to a referral sent by [name]'s health plan.     [name] was given information about Chronic Care Management services today including:  1. CCM service includes personalized support from designated clinical staff supervised by his physician, including individualized plan of care and coordination with other care providers 2. 24/7 contact phone numbers for assistance for urgent and routine care needs. 3. Service will only be billed when office clinical staff spend 20 minutes or more in a month to coordinate care. 4. Only one practitioner may furnish and bill the service in a calendar month. 5. The patient may stop CCM services at any time (effective at the end of the month) by phone call to the office staff. 6. The patient will be responsible for cost sharing (co-pay) of up to 20% of the service fee (after annual deductible is met).  Patient agreed to services and verbal consent obtained.   Follow up plan: Telephone appointment with care management team member scheduled for:01/08/2020  Noreene Larsson, [place], [place], [place] [address] Direct Dial: [phone] [email or web address]: [email or web address]

## 2019-11-28 ENCOUNTER — Ambulatory Visit (INDEPENDENT_AMBULATORY_CARE_PROVIDER_SITE_OTHER): Payer: Medicare Other | Admitting: Family Medicine

## 2019-11-28 ENCOUNTER — Encounter: Payer: Self-pay | Admitting: Family Medicine

## 2019-11-28 ENCOUNTER — Other Ambulatory Visit: Payer: Self-pay

## 2019-11-28 DIAGNOSIS — I482 Chronic atrial fibrillation, unspecified: Secondary | ICD-10-CM

## 2019-11-28 LAB — COAGUCHEK XS/INR WAIVED
INR: 1.6 — ABNORMAL HIGH (ref 0.9–1.1)
Prothrombin Time: 19.8 s

## 2019-11-28 LAB — POCT INR: INR: 1.6 — AB (ref 2.0–3.0)

## 2019-11-28 MED ORDER — SUCRALFATE 1 G PO TABS: ORAL_TABLET | ORAL | 1 refills | Status: DC

## 2019-11-28 MED ORDER — WARFARIN SODIUM 5 MG PO TABS: ORAL_TABLET | ORAL | 6 refills | Status: DC

## 2019-11-28 NOTE — Progress Notes (Signed)
Subjective:  Patient ID: [name] [name], male    DOB: [birth year]  Age: 79 y.o. MRN: [id number]  CC: Coagulation Disorder   HPI [name] presents for Patient in for follow-up of atrial fibrillation. Patient denies any recent bouts of chest pain or palpitations. Additionally, patient is taking anticoagulants. Patient denies any recent excessive bleeding episodes including epistaxis, bleeding from the gums, genitalia, rectal bleeding or hematuria. Additionally there has been no excessive bruising.  Of note is that due to past history of GI bleed his INR is generally kept between 1.8 and 2.2.  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   He has a past surgical history that includes Coronary angioplasty with stent (~ 2008; 07/08/2014); Coronary artery bypass graft (1998); Hernia repair; Umbilical hernia repair (1970's); left and right heart catheterization with coronary/graft  angiogram (N/A, 07/09/2014); Colonoscopy (2013); Esophagogastroduodenoscopy (2013); Givens capsule study (2013); cholecystomy tube (12/28/2014 - 01/06/2015); Esophagogastroduodenoscopy (N/A, 12/12/2015); Cardiac catheterization (N/A, 07/15/2016); and ICD IMPLANT (N/A, 04/05/2017).   His family history includes Early death in his brother; Heart attack in his brother; Heart disease in his brother and father; Hypertension in an other family member; Kidney disease in his mother; Leukemia in his mother; Stomach cancer in his sister and sister.He reports that he quit smoking about 22 years ago. His smoking use included cigarettes. He has a 42.00 pack-year smoking history. He has never used smokeless tobacco. He reports current alcohol use of about 1.0 standard drinks of alcohol per week. He reports that he does not use drugs.    ROS Review of Systems  Constitutional: Negative for fever.  Respiratory: Negative for shortness of breath.   Cardiovascular: Negative for chest pain.  Musculoskeletal: Negative for arthralgias.  Skin: Negative for rash.  Hematological: Does not bruise/bleed easily.    Objective:  BP 137/62   Pulse 60   Temp 98.9 F (37.2 C) (Temporal)   Ht 5\' 10"  (1.778 m)   Wt 177 lb 3.2 oz (80.4 kg)   BMI 25.43 kg/m   BP Readings from Last 3 Encounters:  11/28/19 137/62  10/11/19 (!) 110/45  08/28/19 (!) 138/52    Wt Readings from Last 3 Encounters:  11/28/19 177 lb 3.2 oz (80.4 kg)  10/11/19 175 lb (79.4 kg)  08/28/19 176 lb (79.8 kg)     Physical Exam Vitals reviewed.  Constitutional:      Appearance: He is well-developed.  HENT:     Head: Normocephalic and atraumatic.     Right Ear: External ear normal.  Left Ear: External ear normal.     Mouth/Throat:     Pharynx: No oropharyngeal exudate or posterior oropharyngeal erythema.  Eyes:     Pupils: Pupils are equal, round, and reactive to light.  Cardiovascular:     Rate and Rhythm: Normal rate and regular rhythm.       Heart sounds: No murmur.  Pulmonary:     Effort: No respiratory distress.     Breath sounds: Normal breath sounds.  Musculoskeletal:     Cervical back: Normal range of motion and neck supple.  Neurological:     Mental Status: He is alert and oriented to person, place, and time.       Assessment & Plan:   Yoshimi was seen today for coagulation disorder.  Diagnoses and all orders for this visit:  Atrial fibrillation, chronic ([place]) -     CoaguChek XS/INR Waived  Other orders -     warfarin (COUMADIN) 5 MG tablet; TAKE 1 TABLET BY MOUTH  EVERY MONDAY, WEDNESDAY,  AND FRIDAY, AND 1/2 TABLET  THE REMAINING DAYS OF THE  WEEK -     sucralfate (CARAFATE) 1 g tablet; TAKE 1 TABLET BY MOUTH 4  TIMES DAILY - one hour before MEALS  AND AT BEDTIME -     POCT INR   Patient was given written instructions to follow with regard to Coumadin.  The above recommendations are suggested but based on his INR of 1.6 today I asked him to change his dose as follows He should take 5 mg, 1 tablet on Sunday Tuesday Thursday and Saturday.  He should take 1/2 tablet on Monday Wednesday Friday.  Follow-up 1 month and sooner as needed excessive bruising bleeding.     I am having Mansa R. Hemmer "[name]" maintain his multivitamins ther. w/minerals, Vitamin D3, ferrous sulfate, docusate sodium, loratadine, nitroGLYCERIN, Hypromell-Glycerin-Naphazoline, fluticasone furoate-vilanterol, spironolactone, budesonide, levothyroxine, hydrALAZINE, lovastatin, Entresto, albuterol, ipratropium-albuterol, pantoprazole, carvedilol, furosemide, warfarin, and sucralfate.  Allergies as of 11/28/2019   No Known Allergies     Medication List       Accurate as of November 28, 2019 11:23 PM. If you have any questions, ask your nurse or doctor.        albuterol 108 (90 Base) MCG/ACT inhaler Commonly known as: VENTOLIN HFA USE 2 INHALATIONS BY MOUTH  EVERY 6 HOURS AS NEEDED FOR SHORTNESS OF BREATH   budesonide 0.25  MG/2ML nebulizer solution Commonly known as: PULMICORT USE 1 VIAL  IN  NEBULIZER TWICE  DAILY - rinse mouth after treatment   carvedilol 6.25 MG tablet Commonly known as: COREG Take 1 tablet (6.25 mg total) by mouth 2 (two) times daily with a meal.   Clear Eyes for Dry Eyes Plus 0.8-0.25-0.012 % Soln Generic drug: Hypromell-Glycerin-Naphazoline Place 1-2 drops into both eyes 3 (three) times daily as needed (for dry eyes.).   docusate sodium 100 MG capsule Commonly known as: COLACE Take 100 mg by mouth daily.   Entresto 97-103 MG Generic drug: sacubitril-valsartan TAKE 1 TABLET BY MOUTH TWICE A DAY   ferrous sulfate 325 (65 FE) MG tablet Take 325 mg by mouth 2 (two) times daily.   fluticasone furoate-vilanterol 200-25 MCG/INH Aepb Commonly known as: BREO ELLIPTA Inhale 1 puff into the lungs daily.   furosemide 40 MG tablet Commonly known as: LASIX Take 0.5 tablets (20 mg total) by mouth daily.   hydrALAZINE 50 MG tablet Commonly known as: APRESOLINE Take 1 tablet (50 mg total) by mouth 3 (three) times daily.  ipratropium-albuterol 0.5-2.5 (3) MG/3ML Soln Commonly known as: DUONEB USE 1 VIAL IN NEBULIZER 4 TIMES DAILY   levothyroxine 112 MCG tablet Commonly known as: SYNTHROID TAKE 1 TABLET BY MOUTH  DAILY BEFORE BREAKFAST   loratadine 10 MG tablet Commonly known as: CLARITIN Take 10 mg by mouth daily as needed (for allergies.).   lovastatin 40 MG tablet Commonly known as: MEVACOR TAKE 2 TABLETS BY MOUTH AT  BEDTIME   multivitamins ther. w/minerals Tabs tablet Take 1 tablet by mouth daily.   nitroGLYCERIN 0.4 MG SL tablet Commonly known as: NITROSTAT Place 1 tablet (0.4 mg total) under the tongue every 5 (five) minutes as needed for chest pain.   pantoprazole 40 MG tablet Commonly known as: PROTONIX TAKE 1 TABLET BY MOUTH  TWICE DAILY   spironolactone 25 MG tablet Commonly known as: ALDACTONE TAKE 1/2 TABLET BY MOUTH EVERY DAY   sucralfate 1 g  tablet Commonly known as: CARAFATE TAKE 1 TABLET BY MOUTH 4  TIMES DAILY - one hour before MEALS  AND AT BEDTIME   Vitamin D3 125 MCG (5000 UT) Caps Take 5,000 Units by mouth daily.   warfarin 5 MG tablet Commonly known as: COUMADIN Take as directed by the anticoagulation clinic. If you are unsure how to take this medication, talk to your nurse or doctor. Original instructions: TAKE 1 TABLET BY MOUTH  EVERY MONDAY, WEDNESDAY,  AND FRIDAY, AND 1/2 TABLET  THE REMAINING DAYS OF THE  WEEK        Follow-up: Return in about 1 month (around 12/26/2019).  Claretta Fraise, M.D.

## 2019-11-29 DIAGNOSIS — J449 Chronic obstructive pulmonary disease, unspecified: Secondary | ICD-10-CM | POA: Diagnosis not present

## 2019-11-29 DIAGNOSIS — J45998 Other asthma: Secondary | ICD-10-CM | POA: Diagnosis not present

## 2019-12-03 ENCOUNTER — Other Ambulatory Visit (HOSPITAL_COMMUNITY): Payer: Self-pay | Admitting: Cardiology

## 2019-12-20 ENCOUNTER — Other Ambulatory Visit: Payer: Self-pay | Admitting: Family Medicine

## 2019-12-27 DIAGNOSIS — J449 Chronic obstructive pulmonary disease, unspecified: Secondary | ICD-10-CM | POA: Diagnosis not present

## 2020-01-03 LAB — CUP PACEART REMOTE DEVICE CHECK
Battery Remaining Longevity: 102 mo
Battery Remaining Percentage: 93 %
Brady Statistic RA Percent Paced: 96 %
Brady Statistic RV Percent Paced: 0 %
Date Time Interrogation Session: 20210401010100
HighPow Impedance: 63 Ohm
Implantable Lead Implant Date: 20180703
Implantable Lead Implant Date: 20180703
Implantable Lead Location: 753859
Implantable Lead Location: 753860
Implantable Lead Model: 293
Implantable Lead Model: 3830
Implantable Lead Serial Number: 433180
Implantable Pulse Generator Implant Date: 20180703
Lead Channel Impedance Value: 392 Ohm
Lead Channel Impedance Value: 761 Ohm
Lead Channel Setting Pacing Amplitude: 2.5 V
Lead Channel Setting Pacing Amplitude: 2.5 V
Lead Channel Setting Pacing Pulse Width: 0.4 ms
Lead Channel Setting Sensing Sensitivity: 0.5 mV
Pulse Gen Serial Number: 533886

## 2020-01-07 DIAGNOSIS — J45998 Other asthma: Secondary | ICD-10-CM | POA: Diagnosis not present

## 2020-01-07 DIAGNOSIS — J449 Chronic obstructive pulmonary disease, unspecified: Secondary | ICD-10-CM | POA: Diagnosis not present

## 2020-01-08 ENCOUNTER — Ambulatory Visit: Payer: Medicare Other | Admitting: *Deleted

## 2020-01-08 NOTE — Chronic Care Management (AMB) (Signed)
  Chronic Care Management   Outreach Note  01/08/2020 Name: [name] [name] MRN: [id number] DOB: [birth year]  Referred by: Claretta Fraise, MD Reason for referral : Chronic Care Management (Initial Visit)   An unsuccessful Initial Telephone Visit was attempted today. The patient was referred to the case management team for assistance with care management and care coordination.   Follow Up Plan: A HIPPA compliant phone message was left for the patient providing contact information and requesting a return call.  I will send a message to the Care Guides so that they can reach out to [name] to reschedule his initial visit  [name], BSN, RN-BC [place] [facility] Direct Dial: [phone]

## 2020-01-09 ENCOUNTER — Telehealth: Payer: Self-pay | Admitting: Family Medicine

## 2020-01-09 NOTE — Chronic Care Management (AMB) (Signed)
  Care Management   Note  01/09/2020 Name: [name] [name] MRN: [id number] DOB: [birth year]  [name] is a 79 y.o. year old male who is a primary care patient of Stacks, [name], MD and is actively engaged with the care management team. I reached out to [employer] [facility] by phone today to assist with re-scheduling an initial visit with the RN Case Manager  Follow up plan: Unsuccessful telephone outreach attempt made. A HIPPA compliant phone message was left for the patient providing contact information and requesting a return call.  The care management team will reach out to the patient again over the next 7 days.  If patient returns call to provider office, please advise to call [facility] at [phone].  Noreene Larsson, [place], [place],  [address] Direct Dial: [phone] [email or web address] Website: [email or web address]

## 2020-01-10 ENCOUNTER — Other Ambulatory Visit: Payer: Self-pay

## 2020-01-10 ENCOUNTER — Ambulatory Visit (INDEPENDENT_AMBULATORY_CARE_PROVIDER_SITE_OTHER): Payer: Medicare Other | Admitting: Family Medicine

## 2020-01-10 ENCOUNTER — Encounter: Payer: Self-pay | Admitting: Family Medicine

## 2020-01-10 VITALS — BP 131/65 | HR 59 | Temp 98.0°F | Ht 70.0 in | Wt 178.4 lb

## 2020-01-10 DIAGNOSIS — I482 Chronic atrial fibrillation, unspecified: Secondary | ICD-10-CM | POA: Diagnosis not present

## 2020-01-10 DIAGNOSIS — E038 Other specified hypothyroidism: Secondary | ICD-10-CM

## 2020-01-10 DIAGNOSIS — I10 Essential (primary) hypertension: Secondary | ICD-10-CM

## 2020-01-10 DIAGNOSIS — I6523 Occlusion and stenosis of bilateral carotid arteries: Secondary | ICD-10-CM

## 2020-01-10 DIAGNOSIS — D500 Iron deficiency anemia secondary to blood loss (chronic): Secondary | ICD-10-CM | POA: Diagnosis not present

## 2020-01-10 DIAGNOSIS — I5022 Chronic systolic (congestive) heart failure: Secondary | ICD-10-CM

## 2020-01-10 LAB — COAGUCHEK XS/INR WAIVED
INR: 2.8 — ABNORMAL HIGH (ref 0.9–1.1)
Prothrombin Time: 33.7 s

## 2020-01-10 MED ORDER — LEVOTHYROXINE SODIUM 112 MCG PO TABS: ORAL_TABLET | ORAL | 3 refills | Status: DC

## 2020-01-10 NOTE — Progress Notes (Signed)
Subjective:  Patient ID: [name] [name], male    DOB: Nov 16, 1940  Age: 79 y.o. MRN: [id number]  CC: Follow-up (1 month) and Coagulation Disorder   HPI [name] [name] presents for Patient in for follow-up of atrial fibrillation. Patient denies any recent bouts of chest pain or palpitations. Additionally, patient is taking anticoagulants. Patient denies any recent excessive bleeding episodes including epistaxis, bleeding from the gums, genitalia, rectal bleeding or hematuria. Additionally there has been no excessive bruising.  Patient is treated for congestive heart failure but denies shortness of breath with exertion that at this time.  He does occasionally use fluid pills when he notices some swelling.  That has been intermittent but infrequent.  The fluid pill takes care of it right away.  Patient is concerned about his hemoglobin dropping since it dropped in the past.  He would like to have that checked today.  Additionally he is in for recheck of his INR today and that came back at 2.8.  He was reassured.  Patient presents for follow-up on  thyroid. The patient has a history of hypothyroidism for many years. It has been stable recently. Pt. denies any change in  voice, loss of hair, heat or cold intolerance. Energy level has been adequate to good. Patient denies constipation and diarrhea. No myxedema. Medication is as noted below. Verified that pt is taking it daily on an empty stomach. Well tolerated.  Depression screen [facility] 2/9 01/10/2020 11/28/2019 10/11/2019  Decreased Interest 0 0 0  Down, Depressed, Hopeless 0 0 0  PHQ - 2 Score 0 0 0  Altered sleeping - - -  Tired, decreased energy - - -  Change in appetite - - -  Feeling bad or failure about yourself  - - -  Trouble concentrating - - -  Moving slowly or fidgety/restless - - -  Suicidal thoughts - - -  PHQ-9 Score - - -  Some recent data might be hidden    History [name] [name] has a past medical history of AICD (automatic  cardioverter/defibrillator) present (04/05/2017), Anemia, Arthritis, Asthma, Atrial fibrillation ([place]), CAD (coronary artery disease), CHF (congestive heart failure) ([place]), Cholecystitis (11/2013), CKD (chronic kidney disease) (2015), COPD (chronic obstructive pulmonary disease) ([place]), Esophageal reflux, Gallstones, Gastric antral vascular ectasia (2013), Gout, Hepatomegaly, Hiatal hernia, History of blood transfusion (~ 2012), Hyperlipidemia, Hypothyroidism, Other and unspecified coagulation defects, Personal history of colonic polyps (05/29/2010), and Unspecified essential hypertension.   He has a past surgical history that includes Coronary angioplasty with stent (~ 2008; 07/08/2014); Coronary artery bypass graft (1998); Hernia repair; Umbilical hernia repair (1970's); left and right heart catheterization with coronary/graft angiogram (N/A, 07/09/2014); Colonoscopy (2013); Esophagogastroduodenoscopy (2013); Givens capsule study (2013); cholecystomy tube (12/28/2014 - 01/06/2015); Esophagogastroduodenoscopy (N/A, 12/12/2015); Cardiac catheterization (N/A, 07/15/2016); and ICD IMPLANT (N/A, 04/05/2017).   His family history includes Early death in his brother; Heart attack in his brother; Heart disease in his brother and father; Hypertension in an other family member; Kidney disease in his mother; Leukemia in his mother; Stomach cancer in his sister and sister.He reports that he quit smoking about 23 years ago. His smoking use included cigarettes. He has a 42.00 pack-year smoking history. He has never used smokeless tobacco. He reports current alcohol use of about 1.0 standard drinks of alcohol per week. He reports that he does not use drugs.    ROS Review of Systems  Constitutional: Negative.   HENT: Negative.   Eyes: Negative for visual disturbance.  Respiratory: Negative for cough  and shortness of breath.   Cardiovascular: Negative for chest pain and leg swelling.  Gastrointestinal: Negative for abdominal  pain, diarrhea, nausea and vomiting.  Genitourinary: Negative for difficulty urinating.  Musculoskeletal: Negative for arthralgias and myalgias.  Skin: Negative for rash.  Neurological: Negative for headaches.  Psychiatric/Behavioral: Negative for sleep disturbance.    Objective:  BP 131/65   Pulse (!) 59   Temp 98 F (36.7 C) (Temporal)   Ht '5\' 10"'$  (1.778 m)   Wt 178 lb 6.4 oz (80.9 kg)   BMI 25.60 kg/m   BP Readings from Last 3 Encounters:  01/10/20 131/65  11/28/19 137/62  10/11/19 (!) 110/45    Wt Readings from Last 3 Encounters:  01/10/20 178 lb 6.4 oz (80.9 kg)  11/28/19 177 lb 3.2 oz (80.4 kg)  10/11/19 175 lb (79.4 kg)     Physical Exam Vitals reviewed.  Constitutional:      Appearance: He is well-developed.  HENT:     Head: Normocephalic and atraumatic.     Right Ear: Tympanic membrane and external ear normal. No decreased hearing noted.     Left Ear: Tympanic membrane and external ear normal. No decreased hearing noted.     Mouth/Throat:     Pharynx: No oropharyngeal exudate or posterior oropharyngeal erythema.  Eyes:     Pupils: Pupils are equal, round, and reactive to light.  Cardiovascular:     Rate and Rhythm: Normal rate and regular rhythm.     Heart sounds: No murmur.  Pulmonary:     Effort: No respiratory distress.     Breath sounds: Normal breath sounds.  Abdominal:     General: Bowel sounds are normal.     Palpations: Abdomen is soft. There is no mass.     Tenderness: There is no abdominal tenderness.  Musculoskeletal:     Cervical back: Normal range of motion and neck supple.       Assessment & Plan:   [place] was seen today for follow-up and coagulation disorder.  Diagnoses and all orders for this visit:  Atrial fibrillation, chronic (HCC) -     CoaguChek XS/INR Waived -     CBC with Differential/Platelet -     CMP14+EGFR  Iron deficiency anemia due to chronic blood loss -     CBC with Differential/Platelet -      MBT59+RCBU  Chronic systolic CHF (congestive heart failure) (HCC) -     CMP14+EGFR  Other specified hypothyroidism -     CMP14+EGFR  Essential hypertension -     CMP14+EGFR  Other orders -     levothyroxine (SYNTHROID) 112 MCG tablet; TAKE 1 TABLET BY MOUTH  DAILY BEFORE BREAKFAST   Patient's medications noted above were updated today.  Refills are current.    I am having [name] [name] "[name]" maintain his multivitamins ther. w/minerals, Vitamin D3, ferrous sulfate, docusate sodium, loratadine, nitroGLYCERIN, Hypromell-Glycerin-Naphazoline, fluticasone furoate-vilanterol, hydrALAZINE, lovastatin, Entresto, albuterol, pantoprazole, carvedilol, furosemide, warfarin, sucralfate, spironolactone, budesonide, ipratropium-albuterol, and levothyroxine.  Allergies as of 01/10/2020   No Known Allergies     Medication List       Accurate as of January 10, 2020 12:16 PM. If you have any questions, ask your nurse or doctor.        albuterol 108 (90 Base) MCG/ACT inhaler Commonly known as: VENTOLIN HFA USE 2 INHALATIONS BY MOUTH  EVERY 6 HOURS AS NEEDED FOR SHORTNESS OF BREATH   budesonide 0.25 MG/2ML nebulizer solution Commonly known as: PULMICORT USE 1 VIAL  IN  NEBULIZER TWICE  DAILY - rinse mouth after treatment   carvedilol 6.25 MG tablet Commonly known as: COREG Take 1 tablet (6.25 mg total) by mouth 2 (two) times daily with a meal.   Clear Eyes for Dry Eyes Plus 0.8-0.25-0.012 % Soln Generic drug: Hypromell-Glycerin-Naphazoline Place 1-2 drops into both eyes 3 (three) times daily as needed (for dry eyes.).   docusate sodium 100 MG capsule Commonly known as: COLACE Take 100 mg by mouth daily.   Entresto 97-103 MG Generic drug: sacubitril-valsartan TAKE 1 TABLET BY MOUTH TWICE A DAY   ferrous sulfate 325 (65 FE) MG tablet Take 325 mg by mouth 2 (two) times daily.   fluticasone furoate-vilanterol 200-25 MCG/INH Aepb Commonly known as: BREO ELLIPTA Inhale 1 puff into  the lungs daily.   furosemide 40 MG tablet Commonly known as: LASIX Take 0.5 tablets (20 mg total) by mouth daily.   hydrALAZINE 50 MG tablet Commonly known as: APRESOLINE Take 1 tablet (50 mg total) by mouth 3 (three) times daily.   ipratropium-albuterol 0.5-2.5 (3) MG/3ML Soln Commonly known as: DUONEB USE 1 VIAL IN NEBULIZER 4 TIMES DAILY   levothyroxine 112 MCG tablet Commonly known as: SYNTHROID TAKE 1 TABLET BY MOUTH  DAILY BEFORE BREAKFAST   loratadine 10 MG tablet Commonly known as: CLARITIN Take 10 mg by mouth daily as needed (for allergies.).   lovastatin 40 MG tablet Commonly known as: MEVACOR TAKE 2 TABLETS BY MOUTH AT  BEDTIME   multivitamins ther. w/minerals Tabs tablet Take 1 tablet by mouth daily.   nitroGLYCERIN 0.4 MG SL tablet Commonly known as: NITROSTAT Place 1 tablet (0.4 mg total) under the tongue every 5 (five) minutes as needed for chest pain.   pantoprazole 40 MG tablet Commonly known as: PROTONIX TAKE 1 TABLET BY MOUTH  TWICE DAILY   spironolactone 25 MG tablet Commonly known as: ALDACTONE TAKE 1/2 TABLET BY MOUTH EVERY DAY   sucralfate 1 g tablet Commonly known as: CARAFATE TAKE 1 TABLET BY MOUTH 4  TIMES DAILY - one hour before MEALS  AND AT BEDTIME   Vitamin D3 125 MCG (5000 UT) Caps Take 5,000 Units by mouth daily.   warfarin 5 MG tablet Commonly known as: COUMADIN Take as directed by the anticoagulation clinic. If you are unsure how to take this medication, talk to your nurse or doctor. Original instructions: TAKE 1 TABLET BY MOUTH  EVERY MONDAY, WEDNESDAY,  AND FRIDAY, AND 1/2 TABLET  THE REMAINING DAYS OF THE  WEEK        Follow-up: No follow-ups on file.  Claretta Fraise, M.D.

## 2020-01-11 LAB — CMP14+EGFR
ALT: 11 IU/L (ref 0–44)
AST: 20 IU/L (ref 0–40)
Albumin/Globulin Ratio: 2 (ref 1.2–2.2)
Albumin: 4.1 g/dL (ref 3.7–4.7)
Alkaline Phosphatase: 58 IU/L (ref 39–117)
BUN/Creatinine Ratio: 15 (ref 10–24)
BUN: 27 mg/dL (ref 8–27)
Bilirubin Total: 0.4 mg/dL (ref 0.0–1.2)
CO2: 25 mmol/L (ref 20–29)
Calcium: 9 mg/dL (ref 8.6–10.2)
Chloride: 106 mmol/L (ref 96–106)
Creatinine, Ser: 1.81 mg/dL — ABNORMAL HIGH (ref 0.76–1.27)
GFR calc Af Amer: 40 mL/min/{1.73_m2} — ABNORMAL LOW (ref >59)
GFR calc non Af Amer: 35 mL/min/{1.73_m2} — ABNORMAL LOW (ref >59)
Globulin, Total: 2.1 g/dL (ref 1.5–4.5)
Glucose: 80 mg/dL (ref 65–99)
Potassium: 4.8 mmol/L (ref 3.5–5.2)
Sodium: 143 mmol/L (ref 134–144)
Total Protein: 6.2 g/dL (ref 6.0–8.5)

## 2020-01-11 LAB — CBC WITH DIFFERENTIAL/PLATELET
Basophils Absolute: 0.1 10*3/uL (ref 0.0–0.2)
Basos: 1 %
EOS (ABSOLUTE): 0.3 10*3/uL (ref 0.0–0.4)
Eos: 3 %
Hematocrit: 32.3 % — ABNORMAL LOW (ref 37.5–51.0)
Hemoglobin: 10.4 g/dL — ABNORMAL LOW (ref 13.0–17.7)
Immature Grans (Abs): 0 10*3/uL (ref 0.0–0.1)
Immature Granulocytes: 0 %
Lymphocytes Absolute: 1.1 10*3/uL (ref 0.7–3.1)
Lymphs: 12 %
MCH: 32.1 pg (ref 26.6–33.0)
MCHC: 32.2 g/dL (ref 31.5–35.7)
MCV: 100 fL — ABNORMAL HIGH (ref 79–97)
Monocytes Absolute: 1 10*3/uL — ABNORMAL HIGH (ref 0.1–0.9)
Monocytes: 11 %
Neutrophils Absolute: 6.2 10*3/uL (ref 1.4–7.0)
Neutrophils: 73 %
Platelets: 295 10*3/uL (ref 150–450)
RBC: 3.24 x10E6/uL — ABNORMAL LOW (ref 4.14–5.80)
RDW: 11.8 % (ref 11.6–15.4)
WBC: 8.6 10*3/uL (ref 3.4–10.8)

## 2020-01-14 NOTE — Chronic Care Management (AMB) (Signed)
  Care Management   Note  01/14/2020 Name: AGNES PROBERT MRN: [id number] DOB: 1941-04-20  Keaton R Leamer is a 79 y.o. year old male who is a primary care patient of Stacks, [name], MD and is actively engaged with the care management team. I reached out to [employer] by phone today to assist with re-scheduling an initial visit with the RN Case Manager  Follow up plan: Telephone appointment with care management team member scheduled for:02/19/2020  Noreene Larsson, [place], [place], [place] [address] Direct Dial: [phone] [email or web address] Website: [email or web address]

## 2020-01-18 ENCOUNTER — Telehealth: Payer: Self-pay | Admitting: Internal Medicine

## 2020-01-18 NOTE — Telephone Encounter (Signed)
Remote was in fact received. Pt can disregard the letter.   No answer at home or cell.   LMOM to call back device clinic  [employer], [place]  01/18/2020 9:28 AM

## 2020-01-18 NOTE — Telephone Encounter (Signed)
I spoke with patient. He is calling about the letter he received from the device clinic to send a device transmission. He is not sure how to send this.  Will ask device clinic to call him. He asks to be called after 11 AM today

## 2020-01-18 NOTE — Telephone Encounter (Signed)
New messages:     Patient calling stating that he is to speak with nurse. Please call patient back.

## 2020-01-18 NOTE — Telephone Encounter (Signed)
I let the pt know we did receive his transmission and everything looks normal. I told him his next transmission is 04-03-2020.

## 2020-02-05 DIAGNOSIS — J45998 Other asthma: Secondary | ICD-10-CM | POA: Diagnosis not present

## 2020-02-05 DIAGNOSIS — J449 Chronic obstructive pulmonary disease, unspecified: Secondary | ICD-10-CM | POA: Diagnosis not present

## 2020-02-06 ENCOUNTER — Other Ambulatory Visit: Payer: Self-pay | Admitting: Family Medicine

## 2020-02-06 DIAGNOSIS — J449 Chronic obstructive pulmonary disease, unspecified: Secondary | ICD-10-CM | POA: Diagnosis not present

## 2020-02-11 ENCOUNTER — Other Ambulatory Visit: Payer: Self-pay | Admitting: Family Medicine

## 2020-02-13 ENCOUNTER — Ambulatory Visit: Payer: Medicare Other | Admitting: Family Medicine

## 2020-02-19 ENCOUNTER — Ambulatory Visit (INDEPENDENT_AMBULATORY_CARE_PROVIDER_SITE_OTHER): Payer: Medicare Other | Admitting: *Deleted

## 2020-02-19 DIAGNOSIS — I10 Essential (primary) hypertension: Secondary | ICD-10-CM | POA: Diagnosis not present

## 2020-02-19 DIAGNOSIS — I5022 Chronic systolic (congestive) heart failure: Secondary | ICD-10-CM

## 2020-02-19 NOTE — Chronic Care Management (AMB) (Signed)
Chronic Care Management   Initial Visit Note  02/19/2020 Name: [name] [name] MRN: [id number] DOB: 12-10-1940  Referred by: Claretta Fraise, MD Reason for referral : Chronic Care Management (RN Initial Visit)   [name] [name] is a 79 y.o. year old male who is a primary care patient of Stacks, [name], MD. The CCM team was consulted for assistance with chronic disease management and care coordination needs related to HTN, Afib, Arthritis, CHF, CAD, Asthma with COPD, hypothyroidism, CKD, HLD.  Review of patient status, including review of consultants reports, relevant laboratory and other test results, and collaboration with appropriate care team members and the patient's provider was performed as part of comprehensive patient evaluation and provision of chronic care management services.    Subjective: I spoke with [name] by telephone today regarding management of his chronic medical conditions. He feels that his conditions are well managed at this time but is interested in being followed by the CCM team and agrees to a follow-up call in a couple of months.   SDOH (Social Determinants of Health) assessments performed: Yes See Care Plan activities for detailed interventions related to SDOH     Objective: Outpatient Encounter Medications as of 02/19/2020  Medication Sig  . albuterol (VENTOLIN HFA) 108 (90 Base) MCG/ACT inhaler USE 2 INHALATIONS BY MOUTH  EVERY 6 HOURS AS NEEDED FOR SHORTNESS OF BREATH  . budesonide (PULMICORT) 0.25 MG/2ML nebulizer solution USE 1 VIAL  IN  NEBULIZER TWICE  DAILY - rinse mouth after treatment  . carvedilol (COREG) 6.25 MG tablet Take 1 tablet (6.25 mg total) by mouth 2 (two) times daily with a meal.  . Cholecalciferol (VITAMIN D3) 5000 UNITS CAPS Take 5,000 Units by mouth daily.   Marland Kitchen docusate sodium (COLACE) 100 MG capsule Take 100 mg by mouth daily.   Marland Kitchen ENTRESTO 97-103 MG TAKE 1 TABLET BY MOUTH TWICE A DAY  . ferrous sulfate 325 (65 FE) MG tablet Take 325  mg by mouth 2 (two) times daily.  . fluticasone furoate-vilanterol (BREO ELLIPTA) 200-25 MCG/INH AEPB Inhale 1 puff into the lungs daily.  . furosemide (LASIX) 40 MG tablet Take 0.5 tablets (20 mg total) by mouth daily.  . hydrALAZINE (APRESOLINE) 50 MG tablet Take 1 tablet (50 mg total) by mouth 3 (three) times daily.  . Hypromell-Glycerin-Naphazoline (CLEAR EYES FOR DRY EYES PLUS) 0.8-0.25-0.012 % SOLN Place 1-2 drops into both eyes 3 (three) times daily as needed (for dry eyes.).  Marland Kitchen ipratropium-albuterol (DUONEB) 0.5-2.5 (3) MG/3ML SOLN USE 1 VIAL IN NEBULIZER 4 TIMES DAILY  . levothyroxine (SYNTHROID) 112 MCG tablet TAKE 1 TABLET BY MOUTH  DAILY BEFORE BREAKFAST  . loratadine (CLARITIN) 10 MG tablet Take 10 mg by mouth daily as needed (for allergies.).   Marland Kitchen lovastatin (MEVACOR) 40 MG tablet TAKE 2 TABLETS BY MOUTH AT  BEDTIME  . Multiple Vitamins-Minerals (MULTIVITAMINS THER. W/MINERALS) TABS Take 1 tablet by mouth daily.    . nitroGLYCERIN (NITROSTAT) 0.4 MG SL tablet Place 1 tablet (0.4 mg total) under the tongue every 5 (five) minutes as needed for chest pain.  . pantoprazole (PROTONIX) 40 MG tablet TAKE 1 TABLET BY MOUTH  TWICE DAILY  . spironolactone (ALDACTONE) 25 MG tablet TAKE 1/2 TABLET BY MOUTH EVERY DAY  . sucralfate (CARAFATE) 1 g tablet TAKE 1 TABLET BY MOUTH 4  TIMES DAILY - one hour before MEALS  AND AT BEDTIME  . warfarin (COUMADIN) 5 MG tablet TAKE 1 TABLET BY MOUTH  EVERY MONDAY, WEDNESDAY,  AND  FRIDAY, AND 1/2 TABLET  THE REMAINING DAYS OF THE  WEEK   No facility-administered encounter medications on file as of 02/19/2020.     RN follow-up   . Chronic Disease Management Needs       CARE PLAN ENTRY (see longtitudinal plan of care for additional care plan information)  Current Barriers:  . Chronic Disease Management support, education, and care coordination needs related to HTN, Afib, Arthritis, CHF, CAD, Asthma with COPD, hypothyroidism, CKD, HLD  Clinical Goal(s)  related to HTN, Afib, Arthritis, CHF, CAD, Asthma with COPD, hypothyroidism, CKD, HLD:  Over the next 90 days, patient will:  . Work with the care management team to address educational, disease management, and care coordination needs  . Call provider office for new or worsened signs and symptoms  . Call care management team with questions or concerns . Verbalize basic understanding of patient centered plan of care established today  Interventions related to HTN, Afib, Arthritis, CHF, CAD, Asthma with COPD, hypothyroidism, CKD, HLD:  . Evaluation of current treatment plans and patient's adherence to plan as established by provider . Assessed patient understanding of disease states . Assessed patient's education and care coordination needs . Provided disease specific education to patient  . Collaborated with appropriate clinical care team members regarding patient needs . Provided with CCM contact information and encouraged to reach out as needed  Patient Self Care Activities related to HTN, Afib, Arthritis, CHF, CAD, Asthma with COPD, hypothyroidism, CKD, HLD:  . Patient is able to independently perform ADLs and IADLs    Initial goal documentation         Plan:   The care management team will reach out to the patient again over the next 60 days.   [name], BSN, RN-BC Embedded Chronic Care Manager [facility] [place] [facility] Direct Dial: [phone]

## 2020-02-19 NOTE — Patient Instructions (Signed)
Visit Information  Goals Addressed            This Visit's Progress     Patient Stated   . Chronic Disease Management Needs (pt-stated)       CARE PLAN ENTRY (see longtitudinal plan of care for additional care plan information)  Current Barriers:  . Chronic Disease Management support, education, and care coordination needs related to HTN, Afib, Arthritis, CHF, CAD, Asthma with COPD, hypothyroidism, CKD, HLD  Clinical Goal(s) related to HTN, Afib, Arthritis, CHF, CAD, Asthma with COPD, hypothyroidism, CKD, HLD:  Over the next 90 days, patient will:  . Work with the care management team to address educational, disease management, and care coordination needs  . Call provider office for new or worsened signs and symptoms  . Call care management team with questions or concerns . Verbalize basic understanding of patient centered plan of care established today  Interventions related to HTN, Afib, Arthritis, CHF, CAD, Asthma with COPD, hypothyroidism, CKD, HLD:  . Evaluation of current treatment plans and patient's adherence to plan as established by provider . Assessed patient understanding of disease states . Assessed patient's education and care coordination needs . Provided disease specific education to patient  . Collaborated with appropriate clinical care team members regarding patient needs . Provided with CCM contact information and encouraged to reach out as needed  Patient Self Care Activities related to HTN, Afib, Arthritis, CHF, CAD, Asthma with COPD, hypothyroidism, CKD, HLD:  . Patient is able to independently perform ADLs and IADLs    Initial goal documentation        Mr. Pitter was given information about Chronic Care Management services today including:  1. CCM service includes personalized support from designated clinical staff supervised by his physician, including individualized plan of care and coordination with other care providers 2. 24/7 contact phone numbers  for assistance for urgent and routine care needs. 3. Service will only be billed when office clinical staff spend 20 minutes or more in a month to coordinate care. 4. Only one practitioner may furnish and bill the service in a calendar month. 5. The patient may stop CCM services at any time (effective at the end of the month) by phone call to the office staff. 6. The patient will be responsible for cost sharing (co-pay) of up to 20% of the service fee (after annual deductible is met).  Patient agreed to services and verbal consent obtained.   The patient verbalized understanding of instructions provided today and declined a print copy of patient instruction materials.   The care management team will reach out to the patient again over the next 60 days.   [name], BSN, RN-BC Embedded Chronic Care Manager [facility] [place] [facility] Direct Dial: [phone]

## 2020-02-22 ENCOUNTER — Other Ambulatory Visit: Payer: Self-pay

## 2020-02-22 ENCOUNTER — Ambulatory Visit (INDEPENDENT_AMBULATORY_CARE_PROVIDER_SITE_OTHER): Payer: Medicare Other | Admitting: Family Medicine

## 2020-02-22 ENCOUNTER — Encounter: Payer: Self-pay | Admitting: Family Medicine

## 2020-02-22 VITALS — BP 127/51 | HR 60 | Temp 97.6°F | Resp 20 | Ht 70.0 in | Wt 175.0 lb

## 2020-02-22 DIAGNOSIS — Z7901 Long term (current) use of anticoagulants: Secondary | ICD-10-CM | POA: Diagnosis not present

## 2020-02-22 DIAGNOSIS — I5022 Chronic systolic (congestive) heart failure: Secondary | ICD-10-CM | POA: Diagnosis not present

## 2020-02-22 DIAGNOSIS — I482 Chronic atrial fibrillation, unspecified: Secondary | ICD-10-CM

## 2020-02-22 LAB — COAGUCHEK XS/INR WAIVED
INR: 2.1 — ABNORMAL HIGH (ref 0.9–1.1)
Prothrombin Time: 25 s

## 2020-02-22 NOTE — Progress Notes (Signed)
Subjective:  Patient ID: [name] [name], male    DOB: Sep 16, 1941  Age: 79 y.o. MRN: [id number]  CC: Recheck protime   HPI [name] [name] presents for Patient in for follow-up of atrial fibrillation. Patient denies any recent bouts of chest pain or palpitations. Additionally, patient is taking anticoagulants. Patient denies any recent excessive bleeding episodes including epistaxis, bleeding from the gums, genitalia, rectal bleeding or hematuria. Additionally there has been no excessive bruising.  Depression screen [facility] 2/9 02/22/2020 02/19/2020 01/10/2020  Decreased Interest 0 0 0  Down, Depressed, Hopeless 0 0 0  PHQ - 2 Score 0 0 0  Altered sleeping - - -  Tired, decreased energy - - -  Change in appetite - - -  Feeling bad or failure about yourself  - - -  Trouble concentrating - - -  Moving slowly or fidgety/restless - - -  Suicidal thoughts - - -  PHQ-9 Score - - -  Some recent data might be hidden    History [name] [name] has a past medical history of AICD (automatic cardioverter/defibrillator) present (04/05/2017), Anemia, Arthritis, Asthma, Atrial fibrillation ([place]), CAD (coronary artery disease), CHF (congestive heart failure) ([place]), Cholecystitis (11/2013), CKD (chronic kidney disease) (2015), COPD (chronic obstructive pulmonary disease) ([place]), Esophageal reflux, Gallstones, Gastric antral vascular ectasia (2013), Gout, Hepatomegaly, Hiatal hernia, History of blood transfusion (~ 2012), Hyperlipidemia, Hypothyroidism, Other and unspecified coagulation defects, Personal history of colonic polyps (05/29/2010), and Unspecified essential hypertension.   He has a past surgical history that includes Coronary angioplasty with stent (~ 2008; 07/08/2014); Coronary artery bypass graft (1998); Hernia repair; Umbilical hernia repair (1970's); left and right heart catheterization with coronary/graft angiogram (N/A, 07/09/2014); Colonoscopy (2013); Esophagogastroduodenoscopy (2013); Givens capsule study  (2013); cholecystomy tube (12/28/2014 - 01/06/2015); Esophagogastroduodenoscopy (N/A, 12/12/2015); Cardiac catheterization (N/A, 07/15/2016); and ICD IMPLANT (N/A, 04/05/2017).   His family history includes Early death in his brother; Heart attack in his brother; Heart disease in his brother and father; Hypertension in an other family member; Kidney disease in his mother; Leukemia in his mother; Stomach cancer in his sister and sister.He reports that he quit smoking about 23 years ago. His smoking use included cigarettes. He has a 42.00 pack-year smoking history. He has never used smokeless tobacco. He reports current alcohol use of about 1.0 standard drinks of alcohol per week. He reports that he does not use drugs.    ROS Review of Systems  Constitutional: Negative for fever.  Respiratory: Negative for shortness of breath.   Cardiovascular: Negative for chest pain.  Musculoskeletal: Negative for arthralgias.  Skin: Negative for rash.    Objective:  BP (!) 127/51   Pulse 60   Temp 97.6 F (36.4 C) (Temporal)   Resp 20   Ht 5\' 10"  (1.778 m)   Wt 175 lb (79.4 kg)   SpO2 92%   BMI 25.11 kg/m   BP Readings from Last 3 Encounters:  02/22/20 (!) 127/51  01/10/20 131/65  11/28/19 137/62    Wt Readings from Last 3 Encounters:  02/22/20 175 lb (79.4 kg)  01/10/20 178 lb 6.4 oz (80.9 kg)  11/28/19 177 lb 3.2 oz (80.4 kg)     Physical Exam Vitals reviewed.  Constitutional:      Appearance: He is well-developed.  HENT:     Head: Normocephalic and atraumatic.     Right Ear: External ear normal.     Left Ear: External ear normal.     Mouth/Throat:     Pharynx: No oropharyngeal  exudate or posterior oropharyngeal erythema.  Eyes:     Pupils: Pupils are equal, round, and reactive to light.  Cardiovascular:     Rate and Rhythm: Normal rate and regular rhythm.     Heart sounds: No murmur.  Pulmonary:     Effort: No respiratory distress.     Breath sounds: Normal breath sounds.    Musculoskeletal:     Cervical back: Normal range of motion and neck supple.  Neurological:     Mental Status: He is alert and oriented to person, place, and time.       Assessment & Plan:   [name] [name] was seen today for recheck protime.  Diagnoses and all orders for this visit:  Chronic systolic CHF (congestive heart failure) (HCC)  Atrial fibrillation, chronic (HCC) -     CoaguChek XS/INR Waived  Chronic anticoagulation       I am having [name] R. Schrimpf "[name]" maintain his multivitamins ther. w/minerals, Vitamin D3, ferrous sulfate, docusate sodium, loratadine, nitroGLYCERIN, Hypromell-Glycerin-Naphazoline, fluticasone furoate-vilanterol, hydrALAZINE, lovastatin, Entresto, albuterol, pantoprazole, carvedilol, furosemide, warfarin, sucralfate, spironolactone, budesonide, levothyroxine, and ipratropium-albuterol.  Allergies as of 02/22/2020   No Known Allergies     Medication List       Accurate as of Feb 22, 2020 11:59 PM. If you have any questions, ask your nurse or doctor.        albuterol 108 (90 Base) MCG/ACT inhaler Commonly known as: VENTOLIN HFA USE 2 INHALATIONS BY MOUTH  EVERY 6 HOURS AS NEEDED FOR SHORTNESS OF BREATH   budesonide 0.25 MG/2ML nebulizer solution Commonly known as: PULMICORT USE 1 VIAL  IN  NEBULIZER TWICE  DAILY - rinse mouth after treatment   carvedilol 6.25 MG tablet Commonly known as: COREG Take 1 tablet (6.25 mg total) by mouth 2 (two) times daily with a meal.   Clear Eyes for Dry Eyes Plus 0.8-0.25-0.012 % Soln Generic drug: Hypromell-Glycerin-Naphazoline Place 1-2 drops into both eyes 3 (three) times daily as needed (for dry eyes.).   docusate sodium 100 MG capsule Commonly known as: COLACE Take 100 mg by mouth daily.   Entresto 97-103 MG Generic drug: sacubitril-valsartan TAKE 1 TABLET BY MOUTH TWICE A DAY   ferrous sulfate 325 (65 FE) MG tablet Take 325 mg by mouth 2 (two) times daily.   fluticasone furoate-vilanterol  200-25 MCG/INH Aepb Commonly known as: BREO ELLIPTA Inhale 1 puff into the lungs daily.   furosemide 40 MG tablet Commonly known as: LASIX Take 0.5 tablets (20 mg total) by mouth daily.   hydrALAZINE 50 MG tablet Commonly known as: APRESOLINE Take 1 tablet (50 mg total) by mouth 3 (three) times daily.   ipratropium-albuterol 0.5-2.5 (3) MG/3ML Soln Commonly known as: DUONEB USE 1 VIAL IN NEBULIZER 4 TIMES DAILY   levothyroxine 112 MCG tablet Commonly known as: SYNTHROID TAKE 1 TABLET BY MOUTH  DAILY BEFORE BREAKFAST   loratadine 10 MG tablet Commonly known as: CLARITIN Take 10 mg by mouth daily as needed (for allergies.).   lovastatin 40 MG tablet Commonly known as: MEVACOR TAKE 2 TABLETS BY MOUTH AT  BEDTIME   multivitamins ther. w/minerals Tabs tablet Take 1 tablet by mouth daily.   nitroGLYCERIN 0.4 MG SL tablet Commonly known as: NITROSTAT Place 1 tablet (0.4 mg total) under the tongue every 5 (five) minutes as needed for chest pain.   pantoprazole 40 MG tablet Commonly known as: PROTONIX TAKE 1 TABLET BY MOUTH  TWICE DAILY   spironolactone 25 MG tablet Commonly known as: ALDACTONE  TAKE 1/2 TABLET BY MOUTH EVERY DAY   sucralfate 1 g tablet Commonly known as: CARAFATE TAKE 1 TABLET BY MOUTH 4  TIMES DAILY - one hour before MEALS  AND AT BEDTIME   Vitamin D3 125 MCG (5000 UT) Caps Take 5,000 Units by mouth daily.   warfarin 5 MG tablet Commonly known as: COUMADIN Take as directed by the anticoagulation clinic. If you are unsure how to take this medication, talk to your nurse or doctor. Original instructions: TAKE 1 TABLET BY MOUTH  EVERY MONDAY, WEDNESDAY,  AND FRIDAY, AND 1/2 TABLET  THE REMAINING DAYS OF THE  WEEK        Follow-up: No follow-ups on file.  Claretta Fraise, M.D.

## 2020-02-24 ENCOUNTER — Encounter: Payer: Self-pay | Admitting: Family Medicine

## 2020-02-26 ENCOUNTER — Other Ambulatory Visit (HOSPITAL_COMMUNITY): Payer: Self-pay | Admitting: Cardiology

## 2020-02-27 ENCOUNTER — Telehealth: Payer: Self-pay | Admitting: Family Medicine

## 2020-02-27 ENCOUNTER — Other Ambulatory Visit: Payer: Self-pay | Admitting: Family Medicine

## 2020-02-27 DIAGNOSIS — I6523 Occlusion and stenosis of bilateral carotid arteries: Secondary | ICD-10-CM

## 2020-02-27 NOTE — Telephone Encounter (Signed)
I ordered the test that was requested. Thanks, WS

## 2020-02-27 NOTE — Telephone Encounter (Signed)
°  REFERRAL REQUEST Telephone Note 02/27/2020  What type of referral do you need? Check arteries in his neck  Have you been seen at our office for this problem? Yes last week was told by Dr. Livia Snellen he would place referral (Advise that they may need an appointment with their PCP before a referral can be done)  Is there a particular doctor or location that you prefer? Was told last week somewhere in [place]  Patient notified that referrals can take up to a week or longer to process. If they haven't heard anything within a week they should call back and speak with the referral department.

## 2020-02-27 NOTE — Telephone Encounter (Signed)
Referral department aware and patient aware that referral placed.

## 2020-03-05 ENCOUNTER — Other Ambulatory Visit: Payer: Self-pay

## 2020-03-05 MED ORDER — IPRATROPIUM-ALBUTEROL 0.5-2.5 (3) MG/3ML IN SOLN: RESPIRATORY_TRACT | 2 refills | Status: DC

## 2020-03-06 ENCOUNTER — Other Ambulatory Visit: Payer: Self-pay | Admitting: Family Medicine

## 2020-03-07 ENCOUNTER — Telehealth: Payer: Self-pay | Admitting: Family Medicine

## 2020-03-12 ENCOUNTER — Other Ambulatory Visit: Payer: Self-pay

## 2020-03-12 ENCOUNTER — Ambulatory Visit (HOSPITAL_COMMUNITY)
Admission: RE | Admit: 2020-03-12 | Discharge: 2020-03-12 | Disposition: A | Discharge status: Home / Self Care | Payer: Medicare Other | Source: Ambulatory Visit | Attending: Family Medicine | Admitting: Family Medicine

## 2020-03-12 DIAGNOSIS — I6523 Occlusion and stenosis of bilateral carotid arteries: Secondary | ICD-10-CM | POA: Diagnosis not present

## 2020-03-12 DIAGNOSIS — J449 Chronic obstructive pulmonary disease, unspecified: Secondary | ICD-10-CM | POA: Diagnosis not present

## 2020-03-13 ENCOUNTER — Other Ambulatory Visit: Payer: Self-pay | Admitting: *Deleted

## 2020-03-13 DIAGNOSIS — I482 Chronic atrial fibrillation, unspecified: Secondary | ICD-10-CM

## 2020-03-13 MED ORDER — WARFARIN SODIUM 5 MG PO TABS: ORAL_TABLET | ORAL | 0 refills | Status: DC

## 2020-03-20 ENCOUNTER — Other Ambulatory Visit: Payer: Self-pay | Admitting: Family Medicine

## 2020-03-20 DIAGNOSIS — I6523 Occlusion and stenosis of bilateral carotid arteries: Secondary | ICD-10-CM

## 2020-03-28 DIAGNOSIS — J449 Chronic obstructive pulmonary disease, unspecified: Secondary | ICD-10-CM | POA: Diagnosis not present

## 2020-04-03 ENCOUNTER — Other Ambulatory Visit: Payer: Self-pay

## 2020-04-03 ENCOUNTER — Ambulatory Visit (INDEPENDENT_AMBULATORY_CARE_PROVIDER_SITE_OTHER): Payer: Medicare Other | Admitting: Family Medicine

## 2020-04-03 ENCOUNTER — Encounter: Payer: Self-pay | Admitting: Family Medicine

## 2020-04-03 ENCOUNTER — Ambulatory Visit (INDEPENDENT_AMBULATORY_CARE_PROVIDER_SITE_OTHER): Payer: Medicare Other | Admitting: *Deleted

## 2020-04-03 VITALS — BP 132/55 | HR 60 | Temp 97.8°F | Resp 20 | Ht 70.0 in | Wt 180.0 lb

## 2020-04-03 DIAGNOSIS — I482 Chronic atrial fibrillation, unspecified: Secondary | ICD-10-CM

## 2020-04-03 DIAGNOSIS — I255 Ischemic cardiomyopathy: Secondary | ICD-10-CM | POA: Diagnosis not present

## 2020-04-03 DIAGNOSIS — I25708 Atherosclerosis of coronary artery bypass graft(s), unspecified, with other forms of angina pectoris: Secondary | ICD-10-CM | POA: Diagnosis not present

## 2020-04-03 DIAGNOSIS — J449 Chronic obstructive pulmonary disease, unspecified: Secondary | ICD-10-CM

## 2020-04-03 DIAGNOSIS — J4489 Other specified chronic obstructive pulmonary disease: Secondary | ICD-10-CM

## 2020-04-03 LAB — CUP PACEART REMOTE DEVICE CHECK
Battery Remaining Longevity: 102 mo
Battery Remaining Percentage: 91 %
Brady Statistic RA Percent Paced: 96 %
Brady Statistic RV Percent Paced: 0 %
Date Time Interrogation Session: 20210701010100
HighPow Impedance: 64 Ohm
Implantable Lead Implant Date: 20180703
Implantable Lead Implant Date: 20180703
Implantable Lead Location: 753859
Implantable Lead Location: 753860
Implantable Lead Model: 293
Implantable Lead Model: 3830
Implantable Lead Serial Number: 433180
Implantable Pulse Generator Implant Date: 20180703
Lead Channel Impedance Value: 385 Ohm
Lead Channel Impedance Value: 735 Ohm
Lead Channel Setting Pacing Amplitude: 2.5 V
Lead Channel Setting Pacing Amplitude: 2.5 V
Lead Channel Setting Pacing Pulse Width: 0.4 ms
Lead Channel Setting Sensing Sensitivity: 0.5 mV
Pulse Gen Serial Number: 533886

## 2020-04-03 LAB — COAGUCHEK XS/INR WAIVED
INR: 1.9 — ABNORMAL HIGH (ref 0.9–1.1)
Prothrombin Time: 22.9 s

## 2020-04-03 NOTE — Progress Notes (Signed)
Chief Complaint  Patient presents with  . Anticoagulation    HPI  Patient presents today for Patient in for follow-up of atrial fibrillation. Patient denies any recent bouts of chest pain or palpitations. Additionally, patient is taking anticoagulants. Patient denies any recent excessive bleeding episodes including epistaxis, bleeding from the gums, genitalia, rectal bleeding or hematuria. Additionally there has been no excessive bruising.  PMH: Smoking status noted ROS: Per HPI  Objective: BP (!) 132/55   Pulse 60   Temp 97.8 F (36.6 C) (Temporal)   Resp 20   Ht 5\' 10"  (1.778 m)   Wt 180 lb (81.6 kg)   SpO2 95%   BMI 25.83 kg/m  Gen: NAD, alert, cooperative with exam HEENT: NCAT, EOMI, PERRL CV: RRR, good S1/S2, no murmur Resp: CTABL, no wheezes, non-labored Abd: SNTND, BS present, no guarding or organomegaly Ext: No edema, warm Neuro: Alert and oriented, No gross deficits  Assessment and plan:  1. Atrial fibrillation, chronic ([place])   2. Asthma with COPD ([place]) Chronic  3. 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   Orders Placed This Encounter  Procedures  . CoaguChek XS/INR Waived    Follow up as needed.  Claretta Fraise, MD

## 2020-04-04 ENCOUNTER — Encounter: Payer: Self-pay | Admitting: Family Medicine

## 2020-04-04 NOTE — Progress Notes (Signed)
Remote ICD transmission.   

## 2020-04-07 ENCOUNTER — Other Ambulatory Visit: Payer: Self-pay | Admitting: Family Medicine

## 2020-04-08 DIAGNOSIS — J45998 Other asthma: Secondary | ICD-10-CM | POA: Diagnosis not present

## 2020-04-08 DIAGNOSIS — J449 Chronic obstructive pulmonary disease, unspecified: Secondary | ICD-10-CM | POA: Diagnosis not present

## 2020-04-17 ENCOUNTER — Other Ambulatory Visit: Payer: Self-pay

## 2020-04-17 DIAGNOSIS — I6523 Occlusion and stenosis of bilateral carotid arteries: Secondary | ICD-10-CM

## 2020-04-23 ENCOUNTER — Ambulatory Visit: Payer: Medicare Other | Admitting: *Deleted

## 2020-04-23 DIAGNOSIS — E782 Mixed hyperlipidemia: Secondary | ICD-10-CM

## 2020-04-23 DIAGNOSIS — I10 Essential (primary) hypertension: Secondary | ICD-10-CM

## 2020-04-23 DIAGNOSIS — I5022 Chronic systolic (congestive) heart failure: Secondary | ICD-10-CM

## 2020-04-23 NOTE — Chronic Care Management (AMB) (Signed)
Chronic Care Management   Follow Up Note   04/23/2020 Name: [name] [name] MRN: [id number] DOB: May 05, 1941  Referred by: Claretta Fraise, MD Reason for referral : Chronic Care Management (RN Follow up)   [name] [name] is a 79 y.o. year old male who is a primary care patient of Stacks, [name], MD. The CCM team was consulted for assistance with chronic disease management and care coordination needs.    Review of patient status, including review of consultants reports, relevant laboratory and other test results, and collaboration with appropriate care team members and the patient's provider was performed as part of comprehensive patient evaluation and provision of chronic care management services.    SDOH (Social Determinants of Health) assessments performed: No See Care Plan activities for detailed interventions related to [facility])     Outpatient Encounter Medications as of 04/23/2020  Medication Sig  . albuterol (VENTOLIN HFA) 108 (90 Base) MCG/ACT inhaler USE 2 INHALATIONS BY MOUTH  EVERY 6 HOURS AS NEEDED FOR SHORTNESS OF BREATH  . budesonide (PULMICORT) 0.25 MG/2ML nebulizer solution USE 1 VIAL  IN  NEBULIZER TWICE  DAILY - rinse mouth after treatment  . carvedilol (COREG) 6.25 MG tablet Take 1 tablet (6.25 mg total) by mouth 2 (two) times daily with a meal.  . Cholecalciferol (VITAMIN D3) 5000 UNITS CAPS Take 5,000 Units by mouth daily.   Marland Kitchen docusate sodium (COLACE) 100 MG capsule Take 100 mg by mouth daily.   . ferrous sulfate 325 (65 FE) MG tablet Take 325 mg by mouth 2 (two) times daily.  . fluticasone furoate-vilanterol (BREO ELLIPTA) 200-25 MCG/INH AEPB Inhale 1 puff into the lungs daily.  . furosemide (LASIX) 40 MG tablet Take 0.5 tablets (20 mg total) by mouth daily.  . hydrALAZINE (APRESOLINE) 50 MG tablet Take 1 tablet (50 mg total) by mouth 3 (three) times daily.  . Hypromell-Glycerin-Naphazoline (CLEAR EYES FOR DRY EYES PLUS) 0.8-0.25-0.012 % SOLN Place 1-2 drops into both  eyes 3 (three) times daily as needed (for dry eyes.).  Marland Kitchen ipratropium-albuterol (DUONEB) 0.5-2.5 (3) MG/3ML SOLN USE 1 VIAL IN NEBULIZER 4 TIMES DAILY  . levothyroxine (SYNTHROID) 112 MCG tablet TAKE 1 TABLET BY MOUTH  DAILY BEFORE BREAKFAST  . loratadine (CLARITIN) 10 MG tablet Take 10 mg by mouth daily as needed (for allergies.).   Marland Kitchen lovastatin (MEVACOR) 40 MG tablet TAKE 2 TABLETS BY MOUTH AT  BEDTIME  . Multiple Vitamins-Minerals (MULTIVITAMINS THER. W/MINERALS) TABS Take 1 tablet by mouth daily.    . nitroGLYCERIN (NITROSTAT) 0.4 MG SL tablet Place 1 tablet (0.4 mg total) under the tongue every 5 (five) minutes as needed for chest pain.  . pantoprazole (PROTONIX) 40 MG tablet TAKE 1 TABLET BY MOUTH  TWICE DAILY  . sacubitril-valsartan (ENTRESTO) 97-103 MG Take 1 tablet by mouth 2 (two) times daily. Needs appt for further refills  . spironolactone (ALDACTONE) 25 MG tablet TAKE 1/2 TABLET BY MOUTH EVERY DAY  . sucralfate (CARAFATE) 1 g tablet TAKE 1 TABLET BY MOUTH 4  TIMES DAILY : 1 HOUR BEFORE MEALS AND AT BEDTIME  . warfarin (COUMADIN) 5 MG tablet TAKE 1 TABLET BY MOUTH  EVERY MONDAY, WEDNESDAY,  AND FRIDAY, AND 1/2 TABLET  THE REMAINING DAYS OF THE  WEEK   No facility-administered encounter medications on file as of 04/23/2020.     RN Care Plan             This Visit's Progress     Patient Stated   .  Chronic Disease Management Needs (pt-stated)   On track     CARE PLAN ENTRY (see longtitudinal plan of care for additional care plan information)  Current Barriers:  . Chronic Disease Management support, education, and care coordination needs related to HTN, Afib, Arthritis, CHF, CAD, Asthma with COPD, hypothyroidism, CKD, HLD  Clinical Goal(s) related to HTN, Afib, Arthritis, CHF, CAD, Asthma with COPD, hypothyroidism, CKD, HLD:  Over the next 90 days, patient will:  . Work with the care management team to address educational, disease management, and care coordination needs   . Call provider office for new or worsened signs and symptoms  . Call care management team with questions or concerns . Verbalize basic understanding of patient centered plan of care established today  Interventions related to HTN, Afib, Arthritis, CHF, CAD, Asthma with COPD, hypothyroidism, CKD, HLD:  . Evaluation of current treatment plans and patient's adherence to plan as established by provider . Chart reviewed . Talked with patient by telephone o Reports that his medical conditions are well managed and that he does not have any CCM needs at this time . Collaborated with appropriate clinical care team members regarding patient needs . Provided with CCM contact information and encouraged to reach out as needed  Patient Self Care Activities related to HTN, Afib, Arthritis, CHF, CAD, Asthma with COPD, hypothyroidism, CKD, HLD:  . Patient is able to independently perform ADLs and IADLs    Please see past updates related to this goal by clicking on the "Past Updates" button in the selected goal          Plan:   The care management team will reach out to the patient again over the next 90 days.    [name], BSN, RN-BC Embedded Chronic Care Manager [facility] [place] [facility] Direct Dial: [phone]

## 2020-04-23 NOTE — Patient Instructions (Signed)
Visit Information  Goals Addressed              This Visit's Progress     Patient Stated     Chronic Disease Management Needs (pt-stated)   On track     CARE PLAN ENTRY (see longtitudinal plan of care for additional care plan information)  Current Barriers:   Chronic Disease Management support, education, and care coordination needs related to HTN, Afib, Arthritis, CHF, CAD, Asthma with COPD, hypothyroidism, CKD, HLD  Clinical Goal(s) related to HTN, Afib, Arthritis, CHF, CAD, Asthma with COPD, hypothyroidism, CKD, HLD:  Over the next 90 days, patient will:   Work with the care management team to address educational, disease management, and care coordination needs   Call provider office for new or worsened signs and symptoms   Call care management team with questions or concerns  Verbalize basic understanding of patient centered plan of care established today  Interventions related to HTN, Afib, Arthritis, CHF, CAD, Asthma with COPD, hypothyroidism, CKD, HLD:   Evaluation of current treatment plans and patient's adherence to plan as established by provider  Chart reviewed  Talked with patient by telephone o Reports that his medical conditions are well managed and that he does not have any CCM needs at this time  Collaborated with appropriate clinical care team members regarding patient needs  Provided with CCM contact information and encouraged to reach out as needed  Patient Self Care Activities related to HTN, Afib, Arthritis, CHF, CAD, Asthma with COPD, hypothyroidism, CKD, HLD:   Patient is able to independently perform ADLs and IADLs    Please see past updates related to this goal by clicking on the "Past Updates" button in the selected goal         Patient verbalizes understanding of instructions provided today.   Follow-up Plan The care management team will reach out to the patient again over the next 90 days.   [name], BSN, RN-BC Embedded  Chronic Care Manager [facility] [place] [facility] Direct Dial: [phone]

## 2020-04-24 ENCOUNTER — Ambulatory Visit (INDEPENDENT_AMBULATORY_CARE_PROVIDER_SITE_OTHER): Payer: Medicare Other | Admitting: Physician Assistant

## 2020-04-24 ENCOUNTER — Ambulatory Visit (HOSPITAL_COMMUNITY)
Admission: RE | Admit: 2020-04-24 | Discharge: 2020-04-24 | Disposition: A | Discharge status: Home / Self Care | Payer: Medicare Other | Source: Ambulatory Visit | Attending: Vascular Surgery | Admitting: Vascular Surgery

## 2020-04-24 ENCOUNTER — Other Ambulatory Visit: Payer: Self-pay

## 2020-04-24 ENCOUNTER — Other Ambulatory Visit: Payer: Self-pay | Admitting: *Deleted

## 2020-04-24 VITALS — BP 168/59 | HR 57 | Temp 97.6°F | Resp 20 | Ht 70.0 in | Wt 176.1 lb

## 2020-04-24 DIAGNOSIS — I6523 Occlusion and stenosis of bilateral carotid arteries: Secondary | ICD-10-CM

## 2020-04-24 NOTE — Progress Notes (Signed)
Office Note     CC:  follow up Requesting Provider:  Claretta Fraise, MD  HPI: [name] [name] is a 79 y.o. (08-Oct-1940) male who presents for routine surveillance of bilateral carotid stenosis.He was initially evaluated by Dr. Donnetta Hutching Mar 03, 2018.  Wife accompanies him to his visit today.  He denies any known history of stroke or TIA.  He reports 2 episodes of fleeting vision disturbances of his left eye.  These were approximately 3 to 4 months ago.  He denies unilateral facial drooping, hemiplegia, orreceptive or expressive aphasia.  He has multiple medical problems including hypertension, hyperlipidemia, congestive heart failure, CABG, coronary stent/angioplasty, pacemaker and AICD. He has chronic atrial fibrillation. Also has chronic renal insufficiency.  GFR in April was 35. He is not diabetic.  He is compliant with lovastatin and is maintained on Coumadin for his A. Fib. 42 pyd cigarettes; quit 1998 He golfs regularly and denies lower extremity pain or rest pain Past Medical History:  Diagnosis Date  . 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 Anemia   . Arthritis    "hips; back" (07/09/2014)  . Asthma   . Atrial fibrillation ([place])   . CAD (coronary artery disease)   . CHF (congestive heart failure) ([place])   . Cholecystitis 11/2013  . 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   . COPD (chronic obstructive pulmonary disease) (HCC)    on home oxygen, 2 liters at night10/2015  . Esophageal reflux   . Gallstones   . Gastric antral vascular ectasia 2013  . Gout   . Hepatomegaly   . Hiatal hernia   . History of blood transfusion ~ 2012   "blood count dropped; had to get 3 units"  . Hyperlipidemia   . Hypothyroidism   . Other and unspecified coagulation defects   . Personal history of colonic polyps 05/29/2010   TUBULAR ADENOMA  . Unspecified essential hypertension     Past Surgical History:  Procedure Laterality Date  . CARDIAC  CATHETERIZATION N/A 07/15/2016   Procedure: Right/Left Heart Cath and Coronary/Graft Angiography;  Surgeon: Belva Crome, MD;  Location: [facility];  Service: Cardiovascular;  Laterality: N/A;  . cholecystomy tube  12/28/2014 - 01/06/2015   tube clogged with debris, removed and IR unable to place new tube.   . COLONOSCOPY  2013   Dr. Sharlett Iles: no polyps or evidence of active bleeding  . CORONARY ANGIOPLASTY WITH STENT PLACEMENT  ~ 2008; 07/08/2014   "1 + 3"  . CORONARY ARTERY BYPASS GRAFT  1998   CABG X4  . ESOPHAGOGASTRODUODENOSCOPY  2013   Dr. 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 ESOPHAGOGASTRODUODENOSCOPY N/A 12/12/2015   Procedure: ESOPHAGOGASTRODUODENOSCOPY (EGD);  Surgeon: Gatha Mayer, MD;  Location: [facility];  Service: Endoscopy;  Laterality: N/A;  . GIVENS CAPSULE STUDY  2013   Dr. Sharlett Iles: AVM at 24 and blood at 30 minutes beyond first duodenal image but not actual lesion seen. If persistent IDA, bleeding, recommend enteroscopy with ablation   . HERNIA REPAIR    . ICD IMPLANT N/A 04/05/2017   Procedure: ICD Implant;  Surgeon: Evans Lance, MD;  Location: [facility];  Service: Cardiovascular;  Laterality: N/A;  . LEFT AND RIGHT HEART CATHETERIZATION WITH CORONARY/GRAFT ANGIOGRAM N/A 07/09/2014   Right and left heart cath, bare metal stent to SVG to RCA. Sinclair Grooms, MD;   . UMBILICAL HERNIA REPAIR  [phone]  Social History   Socioeconomic History  . Marital status: Married    Spouse name: [name]  . Number of children: 2  . Years of education: 67  . Highest education level: High school graduate  Occupational History  . Occupation: retired    Comment: [employer]  Tobacco Use  . Smoking status: Former Smoker    Packs/day: 1.00    Years: 42.00    Pack years: 42.00    Types: Cigarettes    Quit date: 12/16/1996    Years since quitting: 23.3  . Smokeless tobacco: Never Used  Vaping  Use  . Vaping Use: Never used  Substance and Sexual Activity  . Alcohol use: Yes    Alcohol/week: 1.0 standard drink    Types: 1 Cans of beer per week    Comment: 1 per month  . Drug use: No  . Sexual activity: Not Currently    Birth control/protection: None  Other Topics Concern  . Not on file  Social History Narrative  . Not on file   Social Determinants of Health   Financial Resource Strain: Low Risk   . Difficulty of Paying Living Expenses: Not hard at all  Food Insecurity: No Food Insecurity  . Worried About Charity fundraiser in the Last Year: Never true  . Ran Out of Food in the Last Year: Never true  Transportation Needs: No Transportation Needs  . Lack of Transportation (Medical): No  . Lack of Transportation (Non-Medical): No  Physical Activity: Sufficiently Active  . Days of Exercise per Week: 7 days  . Minutes of Exercise per Session: 60 min  Stress: No Stress Concern Present  . Feeling of Stress : Not at all  Social Connections: Socially Integrated  . Frequency of Communication with Friends and Family: More than three times a week  . Frequency of Social Gatherings with Friends and Family: More than three times a week  . Attends Religious Services: More than 4 times per year  . Active Member of Clubs or Organizations: Yes  . Attends Archivist Meetings: More than 4 times per year  . Marital Status: Married  Human resources officer Violence: Not At Risk  . Fear of Current or Ex-Partner: No  . Emotionally Abused: No  . Physically Abused: No  . Sexually Abused: No   Family History  Problem Relation Age of Onset  . Leukemia Mother   . Kidney disease Mother        kidney removed   . Heart attack Brother   . Heart disease Brother   . Heart disease Father   . Stomach cancer Sister   . Stomach cancer Sister   . Early death Brother   . Hypertension Other        family   . Colon cancer Neg Hx     Current Outpatient Medications  Medication Sig Dispense  Refill  . albuterol (VENTOLIN HFA) 108 (90 Base) MCG/ACT inhaler USE 2 INHALATIONS BY MOUTH  EVERY 6 HOURS AS NEEDED FOR SHORTNESS OF BREATH 34 g 3  . budesonide (PULMICORT) 0.25 MG/2ML nebulizer solution USE 1 VIAL  IN  NEBULIZER TWICE  DAILY - rinse mouth after treatment 60 mL 0  . carvedilol (COREG) 6.25 MG tablet Take 1 tablet (6.25 mg total) by mouth 2 (two) times daily with a meal. 180 tablet 1  . Cholecalciferol (VITAMIN D3) 5000 UNITS CAPS Take 5,000 Units by mouth daily.     Marland Kitchen docusate sodium (COLACE) 100 MG capsule Take  100 mg by mouth daily.     . ferrous sulfate 325 (65 FE) MG tablet Take 325 mg by mouth 2 (two) times daily.    . fluticasone furoate-vilanterol (BREO ELLIPTA) 200-25 MCG/INH AEPB Inhale 1 puff into the lungs daily. 28 each 2  . furosemide (LASIX) 40 MG tablet Take 0.5 tablets (20 mg total) by mouth daily. 90 tablet 1  . hydrALAZINE (APRESOLINE) 50 MG tablet Take 1 tablet (50 mg total) by mouth 3 (three) times daily. 270 tablet 2  . Hypromell-Glycerin-Naphazoline (CLEAR EYES FOR DRY EYES PLUS) 0.8-0.25-0.012 % SOLN Place 1-2 drops into both eyes 3 (three) times daily as needed (for dry eyes.).    Marland Kitchen ipratropium-albuterol (DUONEB) 0.5-2.5 (3) MG/3ML SOLN USE 1 VIAL IN NEBULIZER 4 TIMES DAILY 360 mL 3  . levothyroxine (SYNTHROID) 112 MCG tablet TAKE 1 TABLET BY MOUTH  DAILY BEFORE BREAKFAST 90 tablet 2  . loratadine (CLARITIN) 10 MG tablet Take 10 mg by mouth daily as needed (for allergies.).     Marland Kitchen lovastatin (MEVACOR) 40 MG tablet TAKE 2 TABLETS BY MOUTH AT  BEDTIME 180 tablet 3  . Multiple Vitamins-Minerals (MULTIVITAMINS THER. W/MINERALS) TABS Take 1 tablet by mouth daily.      . nitroGLYCERIN (NITROSTAT) 0.4 MG SL tablet Place 1 tablet (0.4 mg total) under the tongue every 5 (five) minutes as needed for chest pain. 6 tablet 1  . pantoprazole (PROTONIX) 40 MG tablet TAKE 1 TABLET BY MOUTH  TWICE DAILY 180 tablet 0  . sacubitril-valsartan (ENTRESTO) 97-103 MG Take 1 tablet  by mouth 2 (two) times daily. Needs appt for further refills 180 tablet 0  . spironolactone (ALDACTONE) 25 MG tablet TAKE 1/2 TABLET BY MOUTH EVERY DAY 45 tablet 3  . sucralfate (CARAFATE) 1 g tablet TAKE 1 TABLET BY MOUTH 4  TIMES DAILY : 1 HOUR BEFORE MEALS AND AT BEDTIME 360 tablet 0  . warfarin (COUMADIN) 5 MG tablet TAKE 1 TABLET BY MOUTH  EVERY MONDAY, WEDNESDAY,  AND FRIDAY, AND 1/2 TABLET  THE REMAINING DAYS OF THE  WEEK 90 tablet 0   No current facility-administered medications for this visit.    No Known Allergies   REVIEW OF SYSTEMS:   [X]  denotes positive finding, [ ]  denotes negative finding Cardiac  Comments:  Chest pain or chest pressure:    Shortness of breath upon exertion:    Short of breath when lying flat:    Irregular heart rhythm:        Vascular    Pain in calf, thigh, or hip brought on by ambulation:    Pain in feet at night that wakes you up from your sleep:     Blood clot in your veins:    Leg swelling:         Pulmonary    Oxygen at home:    Productive cough:     Wheezing:         Neurologic    Sudden weakness in arms or legs:     Sudden numbness in arms or legs:     Sudden onset of difficulty speaking or slurred speech:    Temporary loss of vision in one eye:     Problems with dizziness:         Gastrointestinal    Blood in stool:     Vomited blood:         Genitourinary    Burning when urinating:     Blood in urine:  Psychiatric    Major depression:         Hematologic    Bleeding problems:    Problems with blood clotting too easily:        Skin    Rashes or ulcers:        Constitutional    Fever or chills:      PHYSICAL EXAMINATION:  Vitals:   04/24/20 1330 04/24/20 1335  BP: (!) 183/69 (!) 168/59  Pulse: 57   Resp: 20   Temp: 97.6 F (36.4 C)   SpO2: 97%    General:  WDWN in NAD; vital signs documented above Gait: Normal, unaided HENT: WNL, normocephalic Pulmonary: normal non-labored breathing , without  Rales, rhonchi,  wheezing Cardiac: regular HR, without  Murmurs with bilateral carotid bruits Abdomen: soft, NT, no masses Skin: Minor bruising Vascular Exam/Pulses: 2+ dorsalis pedis, femoral, brachial and radial pulses bilaterally Extremities: without ischemic changes, without Gangrene , without cellulitis; without open wounds;  Musculoskeletal: no muscle wasting or atrophy  Neurologic: A&O X 3;  No focal weakness or paresthesias are detected Psychiatric:  The pt has Normal affect.   Non-Invasive Vascular Imaging:   March 12, 2020 at [facility]: 1. Bilateral carotid bifurcation and ICA plaque resulting in 70-99% diameter stenosis on the left, at least 50-69% stenosis on the right. Consider vascular surgical or neurointerventional radiology consultation. 2.  Antegrade bilateral vertebral arterial flow.  April 24, 2020 Right Carotid: Velocities in the right ICA are consistent with a 60-79%  stenosis. Non-hemodynamically significant plaque <50% noted  in the CCA.   Left Carotid: Velocities in the left ICA are consistent with a 60-79%  stenosis. Non-hemodynamically significant plaque <50% noted in the  CCA.   Vertebrals: Bilateral vertebral arteries demonstrate antegrade flow.  Subclavians: Normal flow hemodynamics were seen in bilateral subclavian  arteries.   ASSESSMENT/PLAN:: 79 y.o. male here for follow up for bilateral carotid artery stenosis.  Concern for possible amaurosis fugax episodes of the left eye.  Stable right ICA stenosis estimated at 60 to 79%.  I reviewed the case with Dr. Oneida Alar today.  We recommend aspirin 81 mg daily and CTA of the neck.  The patient agrees to over-the-counter aspirin 81 mg daily.  If his CTA does not reveal critical stenosis, can discontinue aspirin.  Follow-up after CTA with Dr. Donnetta Hutching.  Barbie Banner, PA-C [facility] [phone]  Clinic MD:   Oneida Alar

## 2020-04-25 ENCOUNTER — Other Ambulatory Visit: Payer: Self-pay

## 2020-04-25 DIAGNOSIS — I6523 Occlusion and stenosis of bilateral carotid arteries: Secondary | ICD-10-CM

## 2020-05-01 ENCOUNTER — Other Ambulatory Visit: Payer: Self-pay

## 2020-05-01 ENCOUNTER — Ambulatory Visit (HOSPITAL_COMMUNITY)
Admission: RE | Admit: 2020-05-01 | Discharge: 2020-05-01 | Disposition: A | Discharge status: Home / Self Care | Payer: Medicare Other | Source: Ambulatory Visit | Attending: Vascular Surgery | Admitting: Vascular Surgery

## 2020-05-01 DIAGNOSIS — I7090 Unspecified atherosclerosis: Secondary | ICD-10-CM | POA: Diagnosis not present

## 2020-05-01 DIAGNOSIS — I6503 Occlusion and stenosis of bilateral vertebral arteries: Secondary | ICD-10-CM | POA: Diagnosis not present

## 2020-05-01 DIAGNOSIS — I6523 Occlusion and stenosis of bilateral carotid arteries: Secondary | ICD-10-CM | POA: Insufficient documentation

## 2020-05-01 DIAGNOSIS — I771 Stricture of artery: Secondary | ICD-10-CM | POA: Diagnosis not present

## 2020-05-01 LAB — POCT I-STAT CREATININE: Creatinine, Ser: 1.8 mg/dL — ABNORMAL HIGH (ref 0.61–1.24)

## 2020-05-01 MED ORDER — SODIUM CHLORIDE (PF) 0.9 % IJ SOLN
INTRAMUSCULAR | Status: AC
  Filled 2020-05-01: qty 50

## 2020-05-01 MED ORDER — IOHEXOL 350 MG/ML SOLN
100.0000 mL | Freq: Once | INTRAVENOUS | Status: AC | PRN
  Administered 2020-05-01: 75 mL via INTRAVENOUS

## 2020-05-02 DIAGNOSIS — J449 Chronic obstructive pulmonary disease, unspecified: Secondary | ICD-10-CM | POA: Diagnosis not present

## 2020-05-02 DIAGNOSIS — J45998 Other asthma: Secondary | ICD-10-CM | POA: Diagnosis not present

## 2020-05-06 ENCOUNTER — Other Ambulatory Visit: Payer: Self-pay

## 2020-05-06 ENCOUNTER — Encounter: Payer: Self-pay | Admitting: Vascular Surgery

## 2020-05-06 ENCOUNTER — Ambulatory Visit (INDEPENDENT_AMBULATORY_CARE_PROVIDER_SITE_OTHER): Payer: Medicare Other | Admitting: Vascular Surgery

## 2020-05-06 VITALS — BP 174/85 | HR 60 | Temp 98.2°F | Resp 18 | Ht 69.0 in | Wt 174.2 lb

## 2020-05-06 DIAGNOSIS — I6523 Occlusion and stenosis of bilateral carotid arteries: Secondary | ICD-10-CM

## 2020-05-06 NOTE — Progress Notes (Signed)
[facility]  Patient name: [name] [name] MRN: [id number] DOB: Aug 30, 1941 Sex: male  REASON FOR VISIT: Continued discussion of bilateral carotid disease  HPI: [name] [name] is a 79 y.o. male here today for continued discussion of bilateral carotid disease.  He has been followed by some time in our office for asymptomatic disease.  He recently was seen with repeat duplex suggesting 50 to 79% stenoses bilaterally.  He had described visual changes that were potentially related to retinal embolus and therefore underwent CT scan for further evaluation.  On my discussion with him and his wife today he denies any visual loss.  He simply reports some episodes of redness and irritation in his right eye.  He specifically denies any prior focal weakness or aphasia.  Past Medical History:  Diagnosis Date  . 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 Anemia   . Arthritis    "hips; back" (07/09/2014)  . Asthma   . Atrial fibrillation ([place])   . CAD (coronary artery disease)   . CHF (congestive heart failure) ([place])   . Cholecystitis 11/2013  . 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   . COPD (chronic obstructive pulmonary disease) (HCC)    on home oxygen, 2 liters at night10/2015  . Esophageal reflux   . Gallstones   . Gastric antral vascular ectasia 2013  . Gout   . Hepatomegaly   . Hiatal hernia   . History of blood transfusion ~ 2012   "blood count dropped; had to get 3 units"  . Hyperlipidemia   . Hypothyroidism   . Other and unspecified coagulation defects   . Personal history of colonic polyps 05/29/2010   TUBULAR ADENOMA  . Unspecified essential hypertension     Family History  Problem Relation Age of Onset  . Leukemia Mother   . Kidney disease Mother        kidney removed   . Heart attack Brother   . Heart disease Brother   . Heart disease Father   . Stomach cancer Sister   .  Stomach cancer Sister   . Damontre Millea death Brother   . Hypertension Other        family   . Colon cancer Neg Hx     SOCIAL HISTORY: Social History   Tobacco Use  . Smoking status: Former Smoker    Packs/day: 1.00    Years: 42.00    Pack years: 42.00    Types: Cigarettes    Quit date: 12/16/1996    Years since quitting: 23.4  . Smokeless tobacco: Never Used  Substance Use Topics  . Alcohol use: Yes    Alcohol/week: 1.0 standard drink    Types: 1 Cans of beer per week    Comment: 1 per month    No Known Allergies  Current Outpatient Medications  Medication Sig Dispense Refill  . albuterol (VENTOLIN HFA) 108 (90 Base) MCG/ACT inhaler USE 2 INHALATIONS BY MOUTH  EVERY 6 HOURS AS NEEDED FOR SHORTNESS OF BREATH 34 g 3  . budesonide (PULMICORT) 0.25 MG/2ML nebulizer solution USE 1 VIAL  IN  NEBULIZER TWICE  DAILY - rinse mouth after treatment 60 mL 0  . carvedilol (COREG) 6.25 MG tablet Take 1 tablet (6.25 mg total) by mouth 2 (two) times daily with a meal. 180 tablet 1  . Cholecalciferol (VITAMIN D3) 5000 UNITS CAPS Take 5,000 Units by mouth daily.     Marland Kitchen docusate sodium (COLACE) 100  MG capsule Take 100 mg by mouth daily.     . ferrous sulfate 325 (65 FE) MG tablet Take 325 mg by mouth 2 (two) times daily.    . fluticasone furoate-vilanterol (BREO ELLIPTA) 200-25 MCG/INH AEPB Inhale 1 puff into the lungs daily. 28 each 2  . furosemide (LASIX) 40 MG tablet Take 0.5 tablets (20 mg total) by mouth daily. 90 tablet 1  . hydrALAZINE (APRESOLINE) 50 MG tablet Take 1 tablet (50 mg total) by mouth 3 (three) times daily. 270 tablet 2  . Hypromell-Glycerin-Naphazoline (CLEAR EYES FOR DRY EYES PLUS) 0.8-0.25-0.012 % SOLN Place 1-2 drops into both eyes 3 (three) times daily as needed (for dry eyes.).    Marland Kitchen ipratropium-albuterol (DUONEB) 0.5-2.5 (3) MG/3ML SOLN USE 1 VIAL IN NEBULIZER 4 TIMES DAILY 360 mL 3  . levothyroxine (SYNTHROID) 112 MCG tablet TAKE 1 TABLET BY MOUTH  DAILY BEFORE BREAKFAST 90  tablet 2  . loratadine (CLARITIN) 10 MG tablet Take 10 mg by mouth daily as needed (for allergies.).     Marland Kitchen lovastatin (MEVACOR) 40 MG tablet TAKE 2 TABLETS BY MOUTH AT  BEDTIME 180 tablet 3  . Multiple Vitamins-Minerals (MULTIVITAMINS THER. W/MINERALS) TABS Take 1 tablet by mouth daily.      . nitroGLYCERIN (NITROSTAT) 0.4 MG SL tablet Place 1 tablet (0.4 mg total) under the tongue every 5 (five) minutes as needed for chest pain. 6 tablet 1  . pantoprazole (PROTONIX) 40 MG tablet TAKE 1 TABLET BY MOUTH  TWICE DAILY 180 tablet 0  . sacubitril-valsartan (ENTRESTO) 97-103 MG Take 1 tablet by mouth 2 (two) times daily. Needs appt for further refills 180 tablet 0  . spironolactone (ALDACTONE) 25 MG tablet TAKE 1/2 TABLET BY MOUTH EVERY DAY 45 tablet 3  . sucralfate (CARAFATE) 1 g tablet TAKE 1 TABLET BY MOUTH 4  TIMES DAILY : 1 HOUR BEFORE MEALS AND AT BEDTIME 360 tablet 0  . warfarin (COUMADIN) 5 MG tablet TAKE 1 TABLET BY MOUTH  EVERY MONDAY, WEDNESDAY,  AND FRIDAY, AND 1/2 TABLET  THE REMAINING DAYS OF THE  WEEK 90 tablet 0   No current facility-administered medications for this visit.    REVIEW OF SYSTEMS:  [X]  denotes positive finding, [ ]  denotes negative finding Cardiac  Comments:  Chest pain or chest pressure:    Shortness of breath upon exertion:    Short of breath when lying flat:    Irregular heart rhythm:        Vascular    Pain in calf, thigh, or hip brought on by ambulation:    Pain in feet at night that wakes you up from your sleep:     Blood clot in your veins:    Leg swelling:           PHYSICAL EXAM: Vitals:   05/06/20 1055 05/06/20 1058  BP: (!) 180/91 (!) 174/85  Pulse: 60   Resp: 18   Temp: 98.2 F (36.8 C)   TempSrc: Temporal   SpO2: 96%   Weight: 174 lb 3.2 oz (79 kg)   Height: 5\' 9"  (1.753 m)     GENERAL: The patient is a well-nourished male, in no acute distress. The vital signs are documented above. CARDIOVASCULAR: Carotid arteries without bruits  bilaterally PULMONARY: There is good air exchange  MUSCULOSKELETAL: There are no major deformities or cyanosis. NEUROLOGIC: No focal weakness or paresthesias are detected. SKIN: There are no ulcers or rashes noted. PSYCHIATRIC: The patient has a normal affect.  DATA:  I reviewed his CT angiogram from 05/01/2020 also reviewed his duplex imaging from June and July of this year.  CT scan shows extreme calcification at his right bifurcation with interpretation by the radiologist of at least 75% stenosis.  He does have extreme tortuosity of his internal carotid above the bifurcation and has significant calcified plaque with moderate narrowing at the skull base.  On the left he has a 60% stenosis also calcified and severe tortuosity distal in the internal carotid but no stenosis or calcification distally.  MEDICAL ISSUES: Had a long discussion with the patient and his wife.  I would recommend continued observation only.  He certainly is at the threshold for repair for asymptomatic disease on the right.  I am concerned regarding potential technical issues with the nonsurgically accessible stenosis distal to the carotid bifurcation and severe tortuosity.  His duplex velocities suggest somewhat less than 75% stenosis and calcification can make over estimation of CT impression of carotid disease.  I again reviewed symptoms and he knows to notify us immediately should this occur.  Otherwise we will see him again in 6 months with repeat carotid duplex  There is some question regarding aspirin therapy.  Apparently he was on 81 mg in the past but this was discontinued when he was initiated on Coumadin therapy.  He is to see Dr. Lovena Le with cardiology on Friday of this week.  I would recommend aspirin therapy unless it was felt to be contraindicated.  We will see him again in 6 months    Rosetta Posner, MD [facility] Tel [phone] Pager [phone]

## 2020-05-08 ENCOUNTER — Other Ambulatory Visit: Payer: Self-pay | Admitting: *Deleted

## 2020-05-08 DIAGNOSIS — I6523 Occlusion and stenosis of bilateral carotid arteries: Secondary | ICD-10-CM

## 2020-05-09 ENCOUNTER — Other Ambulatory Visit (HOSPITAL_COMMUNITY): Payer: Self-pay | Admitting: Cardiology

## 2020-05-09 ENCOUNTER — Other Ambulatory Visit: Payer: Self-pay

## 2020-05-09 ENCOUNTER — Ambulatory Visit: Payer: Medicare Other | Admitting: Internal Medicine

## 2020-05-09 ENCOUNTER — Encounter: Payer: Self-pay | Admitting: Internal Medicine

## 2020-05-09 VITALS — BP 162/68 | HR 64 | Ht 70.0 in | Wt 175.6 lb

## 2020-05-09 DIAGNOSIS — I482 Chronic atrial fibrillation, unspecified: Secondary | ICD-10-CM

## 2020-05-09 DIAGNOSIS — Z95810 Presence of automatic (implantable) cardiac defibrillator: Secondary | ICD-10-CM

## 2020-05-09 DIAGNOSIS — I10 Essential (primary) hypertension: Secondary | ICD-10-CM

## 2020-05-09 DIAGNOSIS — I472 Ventricular tachycardia: Secondary | ICD-10-CM | POA: Diagnosis not present

## 2020-05-09 DIAGNOSIS — I5022 Chronic systolic (congestive) heart failure: Secondary | ICD-10-CM

## 2020-05-09 DIAGNOSIS — I4729 Other ventricular tachycardia: Secondary | ICD-10-CM

## 2020-05-09 MED ORDER — CARVEDILOL 6.25 MG PO TABS
9.3750 mg | ORAL_TABLET | Freq: Two times a day (BID) | ORAL | 3 refills | Status: DC
Start: 2020-05-09 — End: 2021-02-16

## 2020-05-09 NOTE — Progress Notes (Signed)
HPI [name] [name] returns today for followup. He is a pleasant 79 yo man who looks younger than his stated age with a h/o CAD, chronic renal insufficiency, chronic atrial fibrillation, and asymptomatic carotid disease. He has recently seen Dr. Donnetta Hutching for his asymptomatic carotid stenosis.  No Known Allergies   Current Outpatient Medications  Medication Sig Dispense Refill  . albuterol (VENTOLIN HFA) 108 (90 Base) MCG/ACT inhaler USE 2 INHALATIONS BY MOUTH  EVERY 6 HOURS AS NEEDED FOR SHORTNESS OF BREATH 34 g 3  . budesonide (PULMICORT) 0.25 MG/2ML nebulizer solution USE 1 VIAL  IN  NEBULIZER TWICE  DAILY - rinse mouth after treatment 60 mL 0  . Cholecalciferol (VITAMIN D3) 5000 UNITS CAPS Take 5,000 Units by mouth daily.     Marland Kitchen docusate sodium (COLACE) 100 MG capsule Take 100 mg by mouth daily.     . ferrous sulfate 325 (65 FE) MG tablet Take 325 mg by mouth 2 (two) times daily.    . fluticasone furoate-vilanterol (BREO ELLIPTA) 200-25 MCG/INH AEPB Inhale 1 puff into the lungs daily. 28 each 2  . furosemide (LASIX) 40 MG tablet Take 0.5 tablets (20 mg total) by mouth daily. 90 tablet 1  . hydrALAZINE (APRESOLINE) 50 MG tablet Take 1 tablet (50 mg total) by mouth 3 (three) times daily. 270 tablet 2  . Hypromell-Glycerin-Naphazoline (CLEAR EYES FOR DRY EYES PLUS) 0.8-0.25-0.012 % SOLN Place 1-2 drops into both eyes 3 (three) times daily as needed (for dry eyes.).    Marland Kitchen ipratropium-albuterol (DUONEB) 0.5-2.5 (3) MG/3ML SOLN USE 1 VIAL IN NEBULIZER 4 TIMES DAILY 360 mL 3  . levothyroxine (SYNTHROID) 112 MCG tablet TAKE 1 TABLET BY MOUTH  DAILY BEFORE BREAKFAST 90 tablet 2  . loratadine (CLARITIN) 10 MG tablet Take 10 mg by mouth daily as needed (for allergies.).     Marland Kitchen lovastatin (MEVACOR) 40 MG tablet TAKE 2 TABLETS BY MOUTH AT  BEDTIME 180 tablet 3  . Multiple Vitamins-Minerals (MULTIVITAMINS THER. W/MINERALS) TABS Take 1 tablet by mouth daily.      . nitroGLYCERIN (NITROSTAT) 0.4 MG SL tablet  Place 1 tablet (0.4 mg total) under the tongue every 5 (five) minutes as needed for chest pain. 6 tablet 1  . pantoprazole (PROTONIX) 40 MG tablet TAKE 1 TABLET BY MOUTH  TWICE DAILY 180 tablet 0  . sacubitril-valsartan (ENTRESTO) 97-103 MG Take 1 tablet by mouth 2 (two) times daily. Needs appt for further refills 180 tablet 0  . spironolactone (ALDACTONE) 25 MG tablet TAKE 1/2 TABLET BY MOUTH EVERY DAY 45 tablet 3  . sucralfate (CARAFATE) 1 g tablet TAKE 1 TABLET BY MOUTH 4  TIMES DAILY : 1 HOUR BEFORE MEALS AND AT BEDTIME 360 tablet 0  . warfarin (COUMADIN) 5 MG tablet TAKE 1 TABLET BY MOUTH  EVERY MONDAY, WEDNESDAY,  AND FRIDAY, AND 1/2 TABLET  THE REMAINING DAYS OF THE  WEEK 90 tablet 0  . carvedilol (COREG) 6.25 MG tablet Take 1.5 tablets (9.375 mg total) by mouth 2 (two) times daily. 270 tablet 3   No current facility-administered medications for this visit.     Past Medical History:  Diagnosis Date  . 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 Anemia   . Arthritis    "hips; back" (07/09/2014)  . Asthma   . Atrial fibrillation ([place])   . CAD (coronary artery disease)   . CHF (congestive heart failure) ([place])   . Cholecystitis 11/2013  . CKD (chronic kidney disease) 2015  Stage  3.   . COPD (chronic obstructive pulmonary disease) ([facility])    on home oxygen, 2 liters at night10/2015  . Esophageal reflux   . Gallstones   . Gastric antral vascular ectasia 2013  . Gout   . Hepatomegaly   . Hiatal hernia   . History of blood transfusion ~ 2012   "blood count dropped; had to get 3 units"  . Hyperlipidemia   . Hypothyroidism   . Other and unspecified coagulation defects   . Personal history of colonic polyps 05/29/2010   TUBULAR ADENOMA  . Unspecified essential hypertension     ROS:   All systems reviewed and negative except as noted in the HPI.   Past Surgical History:  Procedure Laterality Date  . CARDIAC CATHETERIZATION N/A 07/15/2016   Procedure:  Right/Left Heart Cath and Coronary/Graft Angiography;  Surgeon: Belva Crome, MD;  Location: [facility];  Service: Cardiovascular;  Laterality: N/A;  . cholecystomy tube  12/28/2014 - 01/06/2015   tube clogged with debris, removed and IR unable to place new tube.   . COLONOSCOPY  2013   Dr. Sharlett Iles: no polyps or evidence of active bleeding  . CORONARY ANGIOPLASTY WITH STENT PLACEMENT  ~ 2008; 07/08/2014   "1 + 3"  . CORONARY ARTERY BYPASS GRAFT  1998   CABG X4  . ESOPHAGOGASTRODUODENOSCOPY  2013   Dr. 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 ESOPHAGOGASTRODUODENOSCOPY N/A 12/12/2015   Procedure: ESOPHAGOGASTRODUODENOSCOPY (EGD);  Surgeon: Gatha Mayer, MD;  Location: [facility];  Service: Endoscopy;  Laterality: N/A;  . GIVENS CAPSULE STUDY  2013   Dr. Sharlett Iles: AVM at 77 and blood at 30 minutes beyond first duodenal image but not actual lesion seen. If persistent IDA, bleeding, recommend enteroscopy with ablation   . HERNIA REPAIR    . ICD IMPLANT N/A 04/05/2017   Procedure: ICD Implant;  Surgeon: Evans Lance, MD;  Location: [facility];  Service: Cardiovascular;  Laterality: N/A;  . LEFT AND RIGHT HEART CATHETERIZATION WITH CORONARY/GRAFT ANGIOGRAM N/A 07/09/2014   Right and left heart cath, bare metal stent to SVG to RCA. Sinclair Grooms, MD;   . UMBILICAL HERNIA REPAIR  [phone]     Family History  Problem Relation Age of Onset  . Leukemia Mother   . Kidney disease Mother        kidney removed   . Heart attack Brother   . Heart disease Brother   . Heart disease Father   . Stomach cancer Sister   . Stomach cancer Sister   . Early death Brother   . Hypertension Other        family   . Colon cancer Neg Hx      Social History   Socioeconomic History  . Marital status: Married    Spouse name: [name]  . Number of children: 2  . Years of education: 38  . Highest education level: High school graduate    Occupational History  . Occupation: retired    Comment: [employer]  Tobacco Use  . Smoking status: Former Smoker    Packs/day: 1.00    Years: 42.00    Pack years: 42.00    Types: Cigarettes    Quit date: 12/16/1996    Years since quitting: 23.4  . Smokeless tobacco: Never Used  Vaping Use  . Vaping Use: Never used  Substance and Sexual Activity  . Alcohol use: Yes  Alcohol/week: 1.0 standard drink    Types: 1 Cans of beer per week    Comment: 1 per month  . Drug use: No  . Sexual activity: Not Currently    Birth control/protection: None  Other Topics Concern  . Not on file  Social History Narrative  . Not on file   Social Determinants of Health   Financial Resource Strain: Low Risk   . Difficulty of Paying Living Expenses: Not hard at all  Food Insecurity: No Food Insecurity  . Worried About Charity fundraiser in the Last Year: Never true  . Ran Out of Food in the Last Year: Never true  Transportation Needs: No Transportation Needs  . Lack of Transportation (Medical): No  . Lack of Transportation (Non-Medical): No  Physical Activity: Sufficiently Active  . Days of Exercise per Week: 7 days  . Minutes of Exercise per Session: 60 min  Stress: No Stress Concern Present  . Feeling of Stress : Not at all  Social Connections: Socially Integrated  . Frequency of Communication with Friends and Family: More than three times a week  . Frequency of Social Gatherings with Friends and Family: More than three times a week  . Attends Religious Services: More than 4 times per year  . Active Member of Clubs or Organizations: Yes  . Attends Archivist Meetings: More than 4 times per year  . Marital Status: Married  Human resources officer Violence: Not At Risk  . Fear of Current or Ex-Partner: No  . Emotionally Abused: No  . Physically Abused: No  . Sexually Abused: No     BP (!) 162/68   Pulse 64   Ht 5\' 10"  (1.778 m)   Wt 175 lb 9.6 oz (79.7 kg)   SpO2  95%   BMI 25.20 kg/m   Physical Exam:  Well appearing NAD HEENT: Unremarkable Neck:  No JVD, no thyromegally Lymphatics:  No adenopathy Back:  No CVA tenderness Lungs:  Clear with no wheezes HEART:  Regular rate rhythm, no murmurs, no rubs, no clicks Abd:  soft, positive bowel sounds, no organomegally, no rebound, no guarding Ext:  2 plus pulses, no edema, no cyanosis, no clubbing Skin:  No rashes no nodules Neuro:  CN II through XII intact, motor grossly intact  EKG - atrial fib with PVC's and ventricular pacing  DEVICE  Normal device function.  See PaceArt for details.   Assess/Plan: 1. Chronic systolic heart failure - her symptoms are class 2. He will continue his current meds. 2. Atrial fib - his rate is well controlled.  3. 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 4. ICD - his [place] [employer] ICD has 8 years of battery longevity.  Daeron Carreno,MD 1. Chronic atri

## 2020-05-09 NOTE — Patient Instructions (Addendum)
Medication Instructions:  Your physician has recommended you make the following change in your medication:   1.  INcrease your carvedilol 6.25 mg--Take 1.5 tablets (9.375 mg) by mouth twice a day  Labwork: None ordered.  Testing/Procedures: None ordered.  Follow-Up: Your physician wants you to follow-up in: one year with Dr. Lovena Le.   You will receive a reminder letter in the mail two months in advance. If you don't receive a letter, please call our office to schedule the follow-up appointment.  Remote monitoring is used to monitor your ICD from home. This monitoring reduces the number of office visits required to check your device to one time per year. It allows [place] to keep an eye on the functioning of your device to ensure it is working properly. You are scheduled for a device check from home on 07/03/2020. You may send your transmission at any time that day. If you have a wireless device, the transmission will be sent automatically. After your physician reviews your transmission, you will receive a postcard with your next transmission date.  Any Other Special Instructions Will Be Listed Below (If Applicable).  If you need a refill on your cardiac medications before your next appointment, please call your pharmacy.

## 2020-05-19 ENCOUNTER — Encounter: Payer: Self-pay | Admitting: Family Medicine

## 2020-05-19 ENCOUNTER — Ambulatory Visit (INDEPENDENT_AMBULATORY_CARE_PROVIDER_SITE_OTHER): Payer: Medicare Other | Admitting: Family Medicine

## 2020-05-19 ENCOUNTER — Other Ambulatory Visit: Payer: Self-pay

## 2020-05-19 VITALS — BP 132/61 | HR 88 | Temp 97.2°F | Resp 20 | Ht 70.0 in | Wt 175.0 lb

## 2020-05-19 DIAGNOSIS — I5022 Chronic systolic (congestive) heart failure: Secondary | ICD-10-CM

## 2020-05-19 DIAGNOSIS — Z95810 Presence of automatic (implantable) cardiac defibrillator: Secondary | ICD-10-CM

## 2020-05-19 DIAGNOSIS — J449 Chronic obstructive pulmonary disease, unspecified: Secondary | ICD-10-CM

## 2020-05-19 DIAGNOSIS — J4489 Other specified chronic obstructive pulmonary disease: Secondary | ICD-10-CM

## 2020-05-19 DIAGNOSIS — I482 Chronic atrial fibrillation, unspecified: Secondary | ICD-10-CM | POA: Diagnosis not present

## 2020-05-19 LAB — COAGUCHEK XS/INR WAIVED
INR: 1.8 — ABNORMAL HIGH (ref 0.9–1.1)
Prothrombin Time: 21.3 s

## 2020-05-19 NOTE — Progress Notes (Signed)
Subjective:  Patient ID: [name] [name], male    DOB: 05/27/1941  Age: 79 y.o. MRN: [id number]  CC: Anticoagulation   HPI Saurav R Curran presents for Atrial fibrillation follow up. Pt. is treated with rate control and anticoagulation. Pt.  denies palpitations, rapid rate, chest pain,and edema. There has been no bleeding from nose or gums. Pt. has not noticed blood with urine or stool.  Although there is routine bruising easily, it is not excessive.  He is still going to the [facility] regularly and working out.  He is spends 20 to 40 minutes on the treadmill or bicycle and he is also golfing regularly.  Patient is a COPD patient chronically short of breath but he says it has not changed recently.  He is at his baseline for his breathing.  He is wondering about a new inhaler that he saw on TV.  He does not have the name of that.  If he can get the name to let me know.  I will consider it if it would be useful for him.   Depression screen [facility] 2/9 05/19/2020 04/03/2020 02/22/2020  Decreased Interest 0 0 0  Down, Depressed, Hopeless 0 0 0  PHQ - 2 Score 0 0 0  Altered sleeping - - -  Tired, decreased energy - - -  Change in appetite - - -  Feeling bad or failure about yourself  - - -  Trouble concentrating - - -  Moving slowly or fidgety/restless - - -  Suicidal thoughts - - -  PHQ-9 Score - - -  Some recent data might be hidden    History Jareth has a past medical history of AICD (automatic cardioverter/defibrillator) present (04/05/2017), Anemia, Arthritis, Asthma, Atrial fibrillation ([place]), CAD (coronary artery disease), CHF (congestive heart failure) ([place]), Cholecystitis (11/2013), CKD (chronic kidney disease) (2015), COPD (chronic obstructive pulmonary disease) ([place]), Esophageal reflux, Gallstones, Gastric antral vascular ectasia (2013), Gout, Hepatomegaly, Hiatal hernia, History of blood transfusion (~ 2012), Hyperlipidemia, Hypothyroidism, Other and unspecified coagulation defects, Personal  history of colonic polyps (05/29/2010), and Unspecified essential hypertension.   He has a past surgical history that includes Coronary angioplasty with stent (~ 2008; 07/08/2014); Coronary artery bypass graft (1998); Hernia repair; Umbilical hernia repair (1970's); left and right heart catheterization with coronary/graft angiogram (N/A, 07/09/2014); Colonoscopy (2013); Esophagogastroduodenoscopy (2013); Givens capsule study (2013); cholecystomy tube (12/28/2014 - 01/06/2015); Esophagogastroduodenoscopy (N/A, 12/12/2015); Cardiac catheterization (N/A, 07/15/2016); and ICD IMPLANT (N/A, 04/05/2017).   His family history includes Early death in his brother; Heart attack in his brother; Heart disease in his brother and father; Hypertension in an other family member; Kidney disease in his mother; Leukemia in his mother; Stomach cancer in his sister and sister.He reports that he quit smoking about 23 years ago. His smoking use included cigarettes. He has a 42.00 pack-year smoking history. He has never used smokeless tobacco. He reports current alcohol use of about 1.0 standard drink of alcohol per week. He reports that he does not use drugs.    ROS Review of Systems  Constitutional: Negative for fever.  Respiratory: Positive for shortness of breath.   Cardiovascular: Negative for chest pain and leg swelling.  Musculoskeletal: Negative for arthralgias.  Skin: Negative for rash.    Objective:  BP 132/61   Pulse 88   Temp (!) 97.2 F (36.2 C) (Temporal)   Resp 20   Ht 5\' 10"  (1.778 m)   Wt 175 lb (79.4 kg)   SpO2 97%   BMI 25.11  kg/m   BP Readings from Last 3 Encounters:  05/19/20 132/61  05/09/20 (!) 162/68  05/06/20 (!) 174/85    Wt Readings from Last 3 Encounters:  05/19/20 175 lb (79.4 kg)  05/09/20 175 lb 9.6 oz (79.7 kg)  05/06/20 174 lb 3.2 oz (79 kg)     Physical Exam Vitals reviewed.  Constitutional:      Appearance: He is well-developed.  HENT:     Head: Normocephalic and  atraumatic.     Right Ear: External ear normal.     Left Ear: External ear normal.     Mouth/Throat:     Pharynx: No oropharyngeal exudate or posterior oropharyngeal erythema.  Eyes:     Pupils: Pupils are equal, round, and reactive to light.  Cardiovascular:     Rate and Rhythm: Normal rate and regular rhythm.     Heart sounds: No murmur heard.   Pulmonary:     Effort: No respiratory distress.     Breath sounds: Normal breath sounds.  Abdominal:     Palpations: Abdomen is soft.     Tenderness: There is no abdominal tenderness.  Musculoskeletal:     Cervical back: Normal range of motion and neck supple.  Neurological:     Mental Status: He is alert and oriented to person, place, and time.       Assessment & Plan:   Robyn was seen today for anticoagulation.  Diagnoses and all orders for this visit:  Atrial fibrillation, chronic ([place]) -     CoaguChek XS/INR Waived  ICD (implantable cardioverter-defibrillator) in place  Chronic systolic CHF (congestive heart failure) ([place])  Asthma with COPD ([place])       I am having Kirklin R. Valvano "[name]" maintain his multivitamins ther. w/minerals, Vitamin D3, ferrous sulfate, docusate sodium, loratadine, nitroGLYCERIN, Hypromell-Glycerin-Naphazoline, fluticasone furoate-vilanterol, hydrALAZINE, lovastatin, albuterol, furosemide, spironolactone, budesonide, levothyroxine, pantoprazole, sucralfate, warfarin, ipratropium-albuterol, carvedilol, and Entresto.  Allergies as of 05/19/2020   No Known Allergies     Medication List       Accurate as of May 19, 2020 11:26 AM. If you have any questions, ask your nurse or doctor.        albuterol 108 (90 Base) MCG/ACT inhaler Commonly known as: VENTOLIN HFA USE 2 INHALATIONS BY MOUTH  EVERY 6 HOURS AS NEEDED FOR SHORTNESS OF BREATH   budesonide 0.25 MG/2ML nebulizer solution Commonly known as: PULMICORT USE 1 VIAL  IN  NEBULIZER TWICE  DAILY - rinse mouth after treatment     carvedilol 6.25 MG tablet Commonly known as: COREG Take 1.5 tablets (9.375 mg total) by mouth 2 (two) times daily.   Clear Eyes for Dry Eyes Plus 0.8-0.25-0.012 % Soln Generic drug: Hypromell-Glycerin-Naphazoline Place 1-2 drops into both eyes 3 (three) times daily as needed (for dry eyes.).   docusate sodium 100 MG capsule Commonly known as: COLACE Take 100 mg by mouth daily.   Entresto 97-103 MG Generic drug: sacubitril-valsartan Take 1 tablet by mouth 2 (two) times daily. Last refill without appointment. Please call [phone]   ferrous sulfate 325 (65 FE) MG tablet Take 325 mg by mouth 2 (two) times daily.   fluticasone furoate-vilanterol 200-25 MCG/INH Aepb Commonly known as: BREO ELLIPTA Inhale 1 puff into the lungs daily.   furosemide 40 MG tablet Commonly known as: LASIX Take 0.5 tablets (20 mg total) by mouth daily.   hydrALAZINE 50 MG tablet Commonly known as: APRESOLINE Take 1 tablet (50 mg total) by mouth 3 (three) times daily.  ipratropium-albuterol 0.5-2.5 (3) MG/3ML Soln Commonly known as: DUONEB USE 1 VIAL IN NEBULIZER 4 TIMES DAILY   levothyroxine 112 MCG tablet Commonly known as: SYNTHROID TAKE 1 TABLET BY MOUTH  DAILY BEFORE BREAKFAST   loratadine 10 MG tablet Commonly known as: CLARITIN Take 10 mg by mouth daily as needed (for allergies.).   lovastatin 40 MG tablet Commonly known as: MEVACOR TAKE 2 TABLETS BY MOUTH AT  BEDTIME   multivitamins ther. w/minerals Tabs tablet Take 1 tablet by mouth daily.   nitroGLYCERIN 0.4 MG SL tablet Commonly known as: NITROSTAT Place 1 tablet (0.4 mg total) under the tongue every 5 (five) minutes as needed for chest pain.   pantoprazole 40 MG tablet Commonly known as: PROTONIX TAKE 1 TABLET BY MOUTH  TWICE DAILY   spironolactone 25 MG tablet Commonly known as: ALDACTONE TAKE 1/2 TABLET BY MOUTH EVERY DAY   sucralfate 1 g tablet Commonly known as: CARAFATE TAKE 1 TABLET BY MOUTH 4  TIMES DAILY :  1 HOUR BEFORE MEALS AND AT BEDTIME   Vitamin D3 125 MCG (5000 UT) Caps Take 5,000 Units by mouth daily.   warfarin 5 MG tablet Commonly known as: COUMADIN Take as directed by the anticoagulation clinic. If you are unsure how to take this medication, talk to your nurse or doctor. Original instructions: TAKE 1 TABLET BY MOUTH  EVERY MONDAY, WEDNESDAY,  AND FRIDAY, AND 1/2 TABLET  THE REMAINING DAYS OF THE  WEEK        Follow-up: Return in about 6 weeks (around 06/30/2020).  Claretta Fraise, M.D.

## 2020-06-04 DIAGNOSIS — J449 Chronic obstructive pulmonary disease, unspecified: Secondary | ICD-10-CM | POA: Diagnosis not present

## 2020-06-04 DIAGNOSIS — J45998 Other asthma: Secondary | ICD-10-CM | POA: Diagnosis not present

## 2020-06-11 ENCOUNTER — Other Ambulatory Visit (HOSPITAL_COMMUNITY): Payer: Self-pay | Admitting: Cardiology

## 2020-06-16 ENCOUNTER — Other Ambulatory Visit: Payer: Self-pay | Admitting: Family Medicine

## 2020-06-16 DIAGNOSIS — I482 Chronic atrial fibrillation, unspecified: Secondary | ICD-10-CM

## 2020-06-30 DIAGNOSIS — J45998 Other asthma: Secondary | ICD-10-CM | POA: Diagnosis not present

## 2020-06-30 DIAGNOSIS — J449 Chronic obstructive pulmonary disease, unspecified: Secondary | ICD-10-CM | POA: Diagnosis not present

## 2020-07-03 ENCOUNTER — Ambulatory Visit (INDEPENDENT_AMBULATORY_CARE_PROVIDER_SITE_OTHER): Payer: Medicare Other

## 2020-07-03 DIAGNOSIS — I5022 Chronic systolic (congestive) heart failure: Secondary | ICD-10-CM

## 2020-07-04 ENCOUNTER — Telehealth: Payer: Self-pay

## 2020-07-04 NOTE — Telephone Encounter (Signed)
Manual remote transmission received.   Spoke to patients wife [name] (DPR), states patient had on calendar he has a remote transmission 07/05/20. States the patients has had no complaints.  Advised that the machine will send automatically he does not need to press a button. Advised if we see something we will call.   verbalized understanding.

## 2020-07-05 LAB — CUP PACEART REMOTE DEVICE CHECK
Battery Remaining Longevity: 96 mo
Battery Remaining Percentage: 87 %
Brady Statistic RA Percent Paced: 98 %
Brady Statistic RV Percent Paced: 0 %
Date Time Interrogation Session: 20210930010100
HighPow Impedance: 66 Ohm
Implantable Lead Implant Date: 20180703
Implantable Lead Implant Date: 20180703
Implantable Lead Location: 753859
Implantable Lead Location: 753860
Implantable Lead Model: 293
Implantable Lead Model: 3830
Implantable Lead Serial Number: 433180
Implantable Pulse Generator Implant Date: 20180703
Lead Channel Impedance Value: 393 Ohm
Lead Channel Impedance Value: 804 Ohm
Lead Channel Setting Pacing Amplitude: 2.5 V
Lead Channel Setting Pacing Amplitude: 2.5 V
Lead Channel Setting Pacing Pulse Width: 0.4 ms
Lead Channel Setting Sensing Sensitivity: 0.5 mV
Pulse Gen Serial Number: 533886

## 2020-07-07 ENCOUNTER — Other Ambulatory Visit (HOSPITAL_COMMUNITY): Payer: Self-pay | Admitting: Cardiology

## 2020-07-07 NOTE — Progress Notes (Signed)
Remote ICD transmission.   

## 2020-07-10 ENCOUNTER — Other Ambulatory Visit: Payer: Self-pay | Admitting: Family Medicine

## 2020-07-14 ENCOUNTER — Ambulatory Visit (INDEPENDENT_AMBULATORY_CARE_PROVIDER_SITE_OTHER): Payer: Medicare Other | Admitting: Family Medicine

## 2020-07-14 ENCOUNTER — Encounter: Payer: Self-pay | Admitting: Family Medicine

## 2020-07-14 ENCOUNTER — Ambulatory Visit (INDEPENDENT_AMBULATORY_CARE_PROVIDER_SITE_OTHER): Payer: Medicare Other

## 2020-07-14 ENCOUNTER — Other Ambulatory Visit: Payer: Self-pay

## 2020-07-14 VITALS — BP 136/58 | HR 60 | Temp 97.8°F | Ht 70.0 in | Wt 179.2 lb

## 2020-07-14 DIAGNOSIS — I10 Essential (primary) hypertension: Secondary | ICD-10-CM

## 2020-07-14 DIAGNOSIS — I5022 Chronic systolic (congestive) heart failure: Secondary | ICD-10-CM | POA: Diagnosis not present

## 2020-07-14 DIAGNOSIS — R0602 Shortness of breath: Secondary | ICD-10-CM | POA: Diagnosis not present

## 2020-07-14 DIAGNOSIS — J449 Chronic obstructive pulmonary disease, unspecified: Secondary | ICD-10-CM | POA: Diagnosis not present

## 2020-07-14 DIAGNOSIS — I482 Chronic atrial fibrillation, unspecified: Secondary | ICD-10-CM

## 2020-07-14 DIAGNOSIS — J4489 Other specified chronic obstructive pulmonary disease: Secondary | ICD-10-CM

## 2020-07-14 LAB — COAGUCHEK XS/INR WAIVED
INR: 1.8 — ABNORMAL HIGH (ref 0.9–1.1)
Prothrombin Time: 21.5 s

## 2020-07-14 NOTE — Patient Instructions (Signed)
INR 1.8 today. Continue your current regimen of Coumadin (warfarin) as is

## 2020-07-14 NOTE — Progress Notes (Signed)
Subjective:  Patient ID: [name] [name], male    DOB: Jan 19, 1941  Age: 79 y.o. MRN: [id number]  CC: Chronic Anticouagulation (8 week)   HPI [name] [name] presents for follow-up Patient in for follow-up of atrial fibrillation. Patient denies any recent bouts of chest pain or palpitations. Additionally, patient is taking anticoagulants. Patient denies any recent excessive bleeding episodes including epistaxis, bleeding from the gums, genitalia, rectal bleeding or hematuria. Additionally there has been no excessive bruising.  He also has an implanted cardio defibrillator but denies having activated anytime recently.  Patient presents for follow-up on  thyroid. The patient has a history of hypothyroidism for many years. It has been stable recently. Pt. denies any change in  voice, loss of hair, heat or cold intolerance. Energy level has been adequate to good. Patient denies constipation and diarrhea. No myxedema. Medication is as noted below. Verified that pt is taking it daily on an empty stomach. Well tolerated.    Depression screen [facility] 2/9 07/14/2020 05/19/2020 04/03/2020  Decreased Interest 0 0 0  Down, Depressed, Hopeless 0 0 0  PHQ - 2 Score 0 0 0  Altered sleeping - - -  Tired, decreased energy - - -  Change in appetite - - -  Feeling bad or failure about yourself  - - -  Trouble concentrating - - -  Moving slowly or fidgety/restless - - -  Suicidal thoughts - - -  PHQ-9 Score - - -  Some recent data might be hidden    History [name] [name] has a past medical history of AICD (automatic cardioverter/defibrillator) present (04/05/2017), Anemia, Arthritis, Asthma, Atrial fibrillation ([place]), CAD (coronary artery disease), CHF (congestive heart failure) ([place]), Cholecystitis (11/2013), CKD (chronic kidney disease) (2015), COPD (chronic obstructive pulmonary disease) ([place]), Esophageal reflux, Gallstones, Gastric antral vascular ectasia (2013), Gout, Hepatomegaly, Hiatal hernia, History of blood  transfusion (~ 2012), Hyperlipidemia, Hypothyroidism, Other and unspecified coagulation defects, Personal history of colonic polyps (05/29/2010), and Unspecified essential hypertension.   He has a past surgical history that includes Coronary angioplasty with stent (~ 2008; 07/08/2014); Coronary artery bypass graft (1998); Hernia repair; Umbilical hernia repair (1970's); left and right heart catheterization with coronary/graft angiogram (N/A, 07/09/2014); Colonoscopy (2013); Esophagogastroduodenoscopy (2013); Givens capsule study (2013); cholecystomy tube (12/28/2014 - 01/06/2015); Esophagogastroduodenoscopy (N/A, 12/12/2015); Cardiac catheterization (N/A, 07/15/2016); and ICD IMPLANT (N/A, 04/05/2017).   His family history includes Early death in his brother; Heart attack in his brother; Heart disease in his brother and father; Hypertension in an other family member; Kidney disease in his mother; Leukemia in his mother; Stomach cancer in his sister and sister.He reports that he quit smoking about 23 years ago. His smoking use included cigarettes. He has a 42.00 pack-year smoking history. He has never used smokeless tobacco. He reports current alcohol use of about 1.0 standard drink of alcohol per week. He reports that he does not use drugs.    ROS Review of Systems  Constitutional: Negative for fever.  Musculoskeletal: Negative for arthralgias.  Skin: Negative for rash.    Objective:  BP (!) 136/58   Pulse 60   Temp 97.8 F (36.6 C) (Temporal)   Ht 5\' 10"  (1.778 m)   Wt 179 lb 3.2 oz (81.3 kg)   BMI 25.71 kg/m   BP Readings from Last 3 Encounters:  07/14/20 (!) 136/58  05/19/20 132/61  05/09/20 (!) 162/68    Wt Readings from Last 3 Encounters:  07/14/20 179 lb 3.2 oz (81.3 kg)  05/19/20 175 lb (  79.4 kg)  05/09/20 175 lb 9.6 oz (79.7 kg)     Physical Exam Vitals reviewed.  Constitutional:      Appearance: He is well-developed.  HENT:     Head: Normocephalic and atraumatic.     Right  Ear: Tympanic membrane and external ear normal. No decreased hearing noted.     Left Ear: Tympanic membrane and external ear normal. No decreased hearing noted.     Mouth/Throat:     Pharynx: No oropharyngeal exudate or posterior oropharyngeal erythema.  Eyes:     Pupils: Pupils are equal, round, and reactive to light.  Cardiovascular:     Rate and Rhythm: Normal rate and regular rhythm.     Heart sounds: Murmur heard.   Pulmonary:     Effort: No respiratory distress.     Breath sounds: Normal breath sounds.  Abdominal:     General: Bowel sounds are normal.     Palpations: Abdomen is soft. There is no mass.     Tenderness: There is no abdominal tenderness.  Musculoskeletal:     Cervical back: Normal range of motion and neck supple.    CXR - NAD Preliminary reading done by [name] [name]    Assessment & Plan:   [name] [name] was seen today for chronic anticouagulation.  Diagnoses and all orders for this visit:  Atrial fibrillation, chronic (HCC) -     CoaguChek XS/INR Waived  Chronic systolic CHF (congestive heart failure) ([place]) -     DG Chest 2 View; Future  Essential hypertension  Asthma with COPD ([place]) -     DG Chest 2 View; Future       I am having [name] R. Jared "[name]" maintain his multivitamins ther. w/minerals, Vitamin D3, ferrous sulfate, docusate sodium, loratadine, nitroGLYCERIN, Hypromell-Glycerin-Naphazoline, fluticasone furoate-vilanterol, hydrALAZINE, lovastatin, albuterol, furosemide, spironolactone, levothyroxine, sucralfate, carvedilol, warfarin, Entresto, budesonide, ipratropium-albuterol, and pantoprazole.  Allergies as of 07/14/2020   No Known Allergies     Medication List       Accurate as of July 14, 2020 11:59 PM. If you have any questions, ask your nurse or doctor.        albuterol 108 (90 Base) MCG/ACT inhaler Commonly known as: VENTOLIN HFA USE 2 INHALATIONS BY MOUTH  EVERY 6 HOURS AS NEEDED FOR SHORTNESS OF BREATH     budesonide 0.25 MG/2ML nebulizer solution Commonly known as: PULMICORT USE 1 VIAL  IN  NEBULIZER TWICE  DAILY - rinse mouth after treatment   carvedilol 6.25 MG tablet Commonly known as: COREG Take 1.5 tablets (9.375 mg total) by mouth 2 (two) times daily.   Clear Eyes for Dry Eyes Plus 0.8-0.25-0.012 % Soln Generic drug: Hypromell-Glycerin-Naphazoline Place 1-2 drops into both eyes 3 (three) times daily as needed (for dry eyes.).   docusate sodium 100 MG capsule Commonly known as: COLACE Take 100 mg by mouth daily.   Entresto 97-103 MG Generic drug: sacubitril-valsartan TAKE 1 TABLET BY MOUTH 2 (TWO) TIMES DAILY. MUST BE SEEN IN OFFICE FOR FURTHER REFILLS   ferrous sulfate 325 (65 FE) MG tablet Take 325 mg by mouth 2 (two) times daily.   fluticasone furoate-vilanterol 200-25 MCG/INH Aepb Commonly known as: BREO ELLIPTA Inhale 1 puff into the lungs daily.   furosemide 40 MG tablet Commonly known as: LASIX Take 0.5 tablets (20 mg total) by mouth daily.   hydrALAZINE 50 MG tablet Commonly known as: APRESOLINE Take 1 tablet (50 mg total) by mouth 3 (three) times daily.   ipratropium-albuterol 0.5-2.5 (3) MG/3ML Soln  Commonly known as: DUONEB USE 1 VIAL IN NEBULIZER 4 TIMES DAILY   levothyroxine 112 MCG tablet Commonly known as: SYNTHROID TAKE 1 TABLET BY MOUTH  DAILY BEFORE BREAKFAST   loratadine 10 MG tablet Commonly known as: CLARITIN Take 10 mg by mouth daily as needed (for allergies.).   lovastatin 40 MG tablet Commonly known as: MEVACOR TAKE 2 TABLETS BY MOUTH AT  BEDTIME   multivitamins ther. w/minerals Tabs tablet Take 1 tablet by mouth daily.   nitroGLYCERIN 0.4 MG SL tablet Commonly known as: NITROSTAT Place 1 tablet (0.4 mg total) under the tongue every 5 (five) minutes as needed for chest pain.   pantoprazole 40 MG tablet Commonly known as: PROTONIX TAKE 1 TABLET BY MOUTH  TWICE DAILY   spironolactone 25 MG tablet Commonly known as:  ALDACTONE TAKE 1/2 TABLET BY MOUTH EVERY DAY   sucralfate 1 g tablet Commonly known as: CARAFATE TAKE 1 TABLET BY MOUTH 4  TIMES DAILY : 1 HOUR BEFORE MEALS AND AT BEDTIME   Vitamin D3 125 MCG (5000 UT) Caps Take 5,000 Units by mouth daily.   warfarin 5 MG tablet Commonly known as: COUMADIN Take as directed by the anticoagulation clinic. If you are unsure how to take this medication, talk to your nurse or doctor. Original instructions: TAKE 1 TABLET BY MOUTH  EVERY MONDAY WEDNESDAY AND  FRIDAY AND ONE-HALF TABLET  BY MOUTH THE REMAINING DAYS OF THE WEEK        Follow-up: Return in about 1 month (around 08/14/2020) for CHF, COPD.  Claretta Fraise, M.D.

## 2020-07-15 NOTE — Progress Notes (Signed)
Your chest x-ray looked normal. Thanks, WS.

## 2020-07-17 ENCOUNTER — Other Ambulatory Visit (HOSPITAL_COMMUNITY): Payer: Self-pay | Admitting: Cardiology

## 2020-07-20 ENCOUNTER — Encounter: Payer: Self-pay | Admitting: Family Medicine

## 2020-07-23 ENCOUNTER — Other Ambulatory Visit: Payer: Self-pay | Admitting: Family Medicine

## 2020-07-23 DIAGNOSIS — I482 Chronic atrial fibrillation, unspecified: Secondary | ICD-10-CM

## 2020-07-24 ENCOUNTER — Telehealth: Payer: Medicare Other

## 2020-07-25 ENCOUNTER — Telehealth: Payer: Self-pay | Admitting: *Deleted

## 2020-07-25 ENCOUNTER — Telehealth: Payer: Medicare Other | Admitting: *Deleted

## 2020-07-25 NOTE — Telephone Encounter (Signed)
  Chronic Care Management   Outreach Note  07/25/2020 Name: [name] [name] MRN: [id number] DOB: 09-07-1941  Referred by: Claretta Fraise, MD Reason for referral : Chronic Care Management (RN follow up)   An unsuccessful telephone follow-up was attempted today. The patient was referred to the case management team for assistance with care management and care coordination.   Follow Up Plan: The care management team will reach out to the patient again over the next 45 days.    [name], BSN, RN-BC Embedded Chronic Care Manager [facility] / [place] Management Direct Dial: [phone]

## 2020-07-28 DIAGNOSIS — J449 Chronic obstructive pulmonary disease, unspecified: Secondary | ICD-10-CM | POA: Diagnosis not present

## 2020-07-29 DIAGNOSIS — J45998 Other asthma: Secondary | ICD-10-CM | POA: Diagnosis not present

## 2020-07-29 DIAGNOSIS — J449 Chronic obstructive pulmonary disease, unspecified: Secondary | ICD-10-CM | POA: Diagnosis not present

## 2020-08-09 ENCOUNTER — Other Ambulatory Visit (HOSPITAL_COMMUNITY): Payer: Self-pay | Admitting: Cardiology

## 2020-08-11 ENCOUNTER — Telehealth (HOSPITAL_COMMUNITY): Payer: Self-pay | Admitting: Cardiology

## 2020-08-11 MED ORDER — ENTRESTO 97-103 MG PO TABS: 1.0000 | ORAL_TABLET | Freq: Two times a day (BID) | ORAL | 0 refills | Status: DC

## 2020-08-11 NOTE — Telephone Encounter (Signed)
Pt request Entresto 97-103 MG refill, please send script to [facility] [place]. Pt scheduled appt first available in Jan. Thanks

## 2020-08-27 ENCOUNTER — Other Ambulatory Visit (HOSPITAL_COMMUNITY): Payer: Self-pay | Admitting: Cardiology

## 2020-08-28 DIAGNOSIS — J449 Chronic obstructive pulmonary disease, unspecified: Secondary | ICD-10-CM | POA: Diagnosis not present

## 2020-09-02 ENCOUNTER — Ambulatory Visit: Payer: Medicare Other | Admitting: *Deleted

## 2020-09-02 DIAGNOSIS — I5022 Chronic systolic (congestive) heart failure: Secondary | ICD-10-CM

## 2020-09-02 DIAGNOSIS — I10 Essential (primary) hypertension: Secondary | ICD-10-CM

## 2020-09-02 DIAGNOSIS — J449 Chronic obstructive pulmonary disease, unspecified: Secondary | ICD-10-CM

## 2020-09-02 NOTE — Patient Instructions (Signed)
  Plan:  . The patient has been provided with contact information for the care management team and has been advised to call if they would like to re-enroll in the CCM program to receive care management and care coordination services.  . CCM enrollment status changed to "previously enrolled" as per patient request on 09/02/2020  to discontinue enrollment. Case closed to case management services in primary care home.    [name], BSN, RN-BC Embedded Chronic Care Manager [facility] / [place] [facility] Direct Dial: [phone]

## 2020-09-02 NOTE — Chronic Care Management (AMB) (Signed)
  Chronic Care Management   Initial Visit Note  09/02/2020 Name: KARL ERWAY MRN: [id number] DOB: 1941-05-29  Referred by: Claretta Fraise, MD Reason for referral : Chronic Care Management (RN follow up)   Andon DILLON LIVERMORE is a 79 y.o. year old male who is a primary care patient of Stacks, [name], MD. The CCM team was consulted for assistance with chronic disease management and care coordination needs related to HTN, Afib, Arthritis, CHF, CAD, Asthma with COPD, hypothyroidism, CKD, HLD.  Review of patient status, including review of consultants reports, relevant laboratory and other test results was performed prior to outreach.   I spoke with Theresia Lo by telephone today regarding management of their chronic medical conditions. They do not have any CCM or resource needs and feel that their medical conditions are well managed. They appreciated the outreach, but do not feel that CCM services are needed at this time. They will reach out in the future if a need arises.   SDOH (Social Determinants of Health) assessments performed: No See Care Plan activities for detailed interventions related to SDOH     Plan:  . The patient has been provided with contact information for the care management team and has been advised to call if they would like to re-enroll in the CCM program to receive care management and care coordination services.  . CCM enrollment status changed to "previously enrolled" as per patient request on 09/02/2020  to discontinue enrollment. Case closed to case management services in primary care home.  . Patient was provided with general disease management education and encouraged to follow-up with their PCP and specialists as recommended.   [name], BSN, RN-BC Embedded Chronic Care Manager [facility] / [place] Management Direct Dial: [phone]

## 2020-09-09 ENCOUNTER — Other Ambulatory Visit: Payer: Self-pay

## 2020-09-09 ENCOUNTER — Encounter: Payer: Self-pay | Admitting: Family Medicine

## 2020-09-09 ENCOUNTER — Ambulatory Visit (INDEPENDENT_AMBULATORY_CARE_PROVIDER_SITE_OTHER): Payer: Medicare Other | Admitting: Family Medicine

## 2020-09-09 VITALS — BP 110/68 | HR 69 | Temp 97.3°F | Resp 20 | Ht 70.0 in | Wt 176.2 lb

## 2020-09-09 DIAGNOSIS — Z1159 Encounter for screening for other viral diseases: Secondary | ICD-10-CM | POA: Diagnosis not present

## 2020-09-09 DIAGNOSIS — Z23 Encounter for immunization: Secondary | ICD-10-CM

## 2020-09-09 DIAGNOSIS — I25708 Atherosclerosis of coronary artery bypass graft(s), unspecified, with other forms of angina pectoris: Secondary | ICD-10-CM | POA: Diagnosis not present

## 2020-09-09 DIAGNOSIS — E038 Other specified hypothyroidism: Secondary | ICD-10-CM

## 2020-09-09 DIAGNOSIS — I482 Chronic atrial fibrillation, unspecified: Secondary | ICD-10-CM

## 2020-09-09 DIAGNOSIS — H6591 Unspecified nonsuppurative otitis media, right ear: Secondary | ICD-10-CM

## 2020-09-09 LAB — POCT INR: INR: 3.1 — AB (ref 2.0–3.0)

## 2020-09-09 LAB — COAGUCHEK XS/INR WAIVED
INR: 3.1 — ABNORMAL HIGH (ref 0.9–1.1)
Prothrombin Time: 37.2 s

## 2020-09-09 MED ORDER — PANTOPRAZOLE SODIUM 40 MG PO TBEC
40.0000 mg | DELAYED_RELEASE_TABLET | Freq: Two times a day (BID) | ORAL | 3 refills | Status: DC
Start: 2020-09-09 — End: 2020-12-12

## 2020-09-09 MED ORDER — ALBUTEROL SULFATE HFA 108 (90 BASE) MCG/ACT IN AERS
INHALATION_SPRAY | RESPIRATORY_TRACT | 11 refills | Status: DC
Start: 2020-09-09 — End: 2020-12-22

## 2020-09-09 MED ORDER — FUROSEMIDE 40 MG PO TABS
20.0000 mg | ORAL_TABLET | Freq: Every day | ORAL | 1 refills | Status: DC
Start: 2020-09-09 — End: 2020-10-23

## 2020-09-09 MED ORDER — LEVOTHYROXINE SODIUM 112 MCG PO TABS: ORAL_TABLET | ORAL | 2 refills | Status: DC

## 2020-09-09 MED ORDER — AMOXICILLIN-POT CLAVULANATE 875-125 MG PO TABS: 1.0000 | ORAL_TABLET | Freq: Two times a day (BID) | ORAL | 0 refills | Status: DC

## 2020-09-09 MED ORDER — BUDESONIDE 0.25 MG/2ML IN SUSP
RESPIRATORY_TRACT | 5 refills | Status: DC
Start: 2020-09-09 — End: 2020-10-31

## 2020-09-09 MED ORDER — WARFARIN SODIUM 5 MG PO TABS: ORAL_TABLET | ORAL | 0 refills | Status: DC

## 2020-09-09 MED ORDER — LOVASTATIN 40 MG PO TABS: 80.0000 mg | ORAL_TABLET | Freq: Every day | ORAL | 3 refills | Status: DC

## 2020-09-09 NOTE — Patient Instructions (Signed)
Continue current coumadin dose

## 2020-09-09 NOTE — Progress Notes (Signed)
Subjective:  Patient ID: [name] [name], male    DOB: July 18, 1941  Age: 79 y.o. MRN: [id number]  CC: Coagulation Disorder   HPI Cormick R Loeffelholz presents for Atrial fibrillation follow up. Pt. is treated with rate control and anticoagulation. Pt.  denies palpitations, rapid rate, chest pain, dyspnea and edema. There has been no bleeding from nose or gums. Pt. has not noticed blood with urine or stool.  Although there is routine bruising easily, it is not excessive.  follow-up on  thyroid. The patient has a history of hypothyroidism for many years. It has been stable recently. Pt. denies any change in  voice, loss of hair, heat or cold intolerance. Energy level has been adequate to good. Patient denies constipation and diarrhea. No myxedema. Medication is as noted below. Verified that pt is taking it daily on an empty stomach. Well tolerated.   Depression screen [facility] 2/9 09/09/2020 07/14/2020 05/19/2020  Decreased Interest 0 0 0  Down, Depressed, Hopeless 0 0 0  PHQ - 2 Score 0 0 0  Altered sleeping - - -  Tired, decreased energy - - -  Change in appetite - - -  Feeling bad or failure about yourself  - - -  Trouble concentrating - - -  Moving slowly or fidgety/restless - - -  Suicidal thoughts - - -  PHQ-9 Score - - -  Some recent data might be hidden    History Candido has a past medical history of AICD (automatic cardioverter/defibrillator) present (04/05/2017), Anemia, Arthritis, Asthma, Atrial fibrillation ([place]), CAD (coronary artery disease), CHF (congestive heart failure) ([place]), Cholecystitis (11/2013), CKD (chronic kidney disease) (2015), COPD (chronic obstructive pulmonary disease) ([place]), Esophageal reflux, Gallstones, Gastric antral vascular ectasia (2013), Gout, Hepatomegaly, Hiatal hernia, History of blood transfusion (~ 2012), Hyperlipidemia, Hypothyroidism, Other and unspecified coagulation defects, Personal history of colonic polyps (05/29/2010), and Unspecified essential  hypertension.   He has a past surgical history that includes Coronary angioplasty with stent (~ 2008; 07/08/2014); Coronary artery bypass graft (1998); Hernia repair; Umbilical hernia repair (1970's); left and right heart catheterization with coronary/graft angiogram (N/A, 07/09/2014); Colonoscopy (2013); Esophagogastroduodenoscopy (2013); Givens capsule study (2013); cholecystomy tube (12/28/2014 - 01/06/2015); Esophagogastroduodenoscopy (N/A, 12/12/2015); Cardiac catheterization (N/A, 07/15/2016); and ICD IMPLANT (N/A, 04/05/2017).   His family history includes Early death in his brother; Heart attack in his brother; Heart disease in his brother and father; Hypertension in an other family member; Kidney disease in his mother; Leukemia in his mother; Stomach cancer in his sister and sister.He reports that he quit smoking about 23 years ago. His smoking use included cigarettes. He has a 42.00 pack-year smoking history. He has never used smokeless tobacco. He reports current alcohol use of about 1.0 standard drink of alcohol per week. He reports that he does not use drugs.    ROS Review of Systems  Constitutional: Negative for fever.  HENT: Positive for ear pain (Right X 1 mo. No affect on hearing. No relief with OTC drops).   Respiratory: Negative for shortness of breath.   Cardiovascular: Negative for chest pain.  Musculoskeletal: Negative for arthralgias.  Skin: Negative for rash.    Objective:  BP 110/68   Pulse 69   Temp (!) 97.3 F (36.3 C) (Temporal)   Resp 20   Ht $R'5\' 10"'jK$  (1.778 m)   Wt 176 lb 4 oz (79.9 kg)   SpO2 96%   BMI 25.29 kg/m   BP Readings from Last 3 Encounters:  09/09/20 110/68  07/14/20 Marland Kitchen)  136/58  05/19/20 132/61    Wt Readings from Last 3 Encounters:  09/09/20 176 lb 4 oz (79.9 kg)  07/14/20 179 lb 3.2 oz (81.3 kg)  05/19/20 175 lb (79.4 kg)     Physical Exam Vitals reviewed.  Constitutional:      Appearance: He is well-developed.  HENT:     Head:  Normocephalic and atraumatic.     Right Ear: Tympanic membrane and external ear normal. No decreased hearing noted.     Left Ear: Tympanic membrane and external ear normal. No decreased hearing noted.     Nose: Mucosal edema present.     Right Sinus: No frontal sinus tenderness.     Left Sinus: No frontal sinus tenderness.     Mouth/Throat:     Pharynx: No oropharyngeal exudate or posterior oropharyngeal erythema.  Eyes:     Pupils: Pupils are equal, round, and reactive to light.  Neck:     Meningeal: Brudzinski's sign absent.  Cardiovascular:     Rate and Rhythm: Normal rate and regular rhythm.     Heart sounds: No murmur heard.   Pulmonary:     Effort: No respiratory distress.     Breath sounds: Normal breath sounds. No wheezing or rales.  Abdominal:     General: Bowel sounds are normal.     Palpations: Abdomen is soft. There is no mass.     Tenderness: There is no abdominal tenderness.  Musculoskeletal:     Cervical back: Normal range of motion and neck supple.  Lymphadenopathy:     Head:     Right side of head: No preauricular adenopathy.     Left side of head: No preauricular adenopathy.     Cervical:     Right cervical: No superficial cervical adenopathy.    Left cervical: No superficial cervical adenopathy.  Skin:    General: Skin is warm and dry.  Neurological:     Mental Status: He is alert and oriented to person, place, and time.       Assessment & Plan:   Athel was seen today for coagulation disorder.  Diagnoses and all orders for this visit:  Atrial fibrillation, chronic (HCC) -     CoaguChek XS/INR Waived -     CBC with Differential/Platelet -     CMP14+EGFR -     Lipid panel -     TSH + free T4 -     warfarin (COUMADIN) 5 MG tablet; TAKE 1 TABLET BY MOUTH  EVERY MONDAY, WEDNESDAY,  AND FRIDAY AND 1/2 TABLET  ON THE REMAINING DAYS OF  THE WEEK -     POCT INR  Need for immunization against influenza -     Flu Vaccine QUAD High  Dose(Fluad)  Right otitis media with effusion -     amoxicillin-clavulanate (AUGMENTIN) 875-125 MG tablet; Take 1 tablet by mouth 2 (two) times daily. 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  Other orders -     albuterol (VENTOLIN HFA) 108 (90 Base) MCG/ACT inhaler; USE 2 INHALATIONS BY MOUTH  EVERY  6 HOURS AS NEEDED FOR SHORTNESS OF BREATH -     budesonide (PULMICORT) 0.25 MG/2ML nebulizer solution; USE 1 VIAL  IN  NEBULIZER TWICE  DAILY - rinse mouth after treatment -     furosemide (LASIX) 40 MG tablet; Take 0.5 tablets (20 mg total) by mouth daily. -     pantoprazole (PROTONIX) 40 MG tablet; Take 1 tablet (40 mg total) by mouth 2 (two) times daily.       I have changed Daniell R. Zabriskie "[name]"'s lovastatin and pantoprazole. I am also having him start on amoxicillin-clavulanate. Additionally, I am having him maintain his multivitamins ther. w/minerals, Vitamin D3, ferrous sulfate, docusate sodium, loratadine, nitroGLYCERIN, Hypromell-Glycerin-Naphazoline, fluticasone furoate-vilanterol, spironolactone, carvedilol, ipratropium-albuterol, hydrALAZINE, sucralfate, Entresto, albuterol, budesonide, furosemide, levothyroxine, and warfarin.  Allergies as of 09/09/2020   No Known Allergies     Medication List       Accurate as of September 09, 2020 10:16 PM. If you have any questions, ask your nurse or doctor.        albuterol 108 (90 Base) MCG/ACT inhaler Commonly known as: VENTOLIN HFA USE 2 INHALATIONS BY MOUTH  EVERY 6 HOURS AS NEEDED FOR SHORTNESS OF BREATH   amoxicillin-clavulanate 875-125 MG tablet Commonly known as: AUGMENTIN Take 1 tablet by  mouth 2 (two) times daily. Take all of this medication Started by: Claretta Fraise, MD   budesonide 0.25 MG/2ML nebulizer solution Commonly known as: PULMICORT USE 1 VIAL  IN  NEBULIZER TWICE  DAILY - rinse mouth after treatment   carvedilol 6.25 MG tablet Commonly known as: COREG Take 1.5 tablets (9.375 mg total) by mouth 2 (two) times daily.   Clear Eyes for Dry Eyes Plus 0.8-0.25-0.012 % Soln Generic drug: Hypromell-Glycerin-Naphazoline Place 1-2 drops into both eyes 3 (three) times daily as needed (for dry eyes.).   docusate sodium 100 MG capsule Commonly known as: COLACE Take 100 mg by mouth daily.   Entresto 97-103 MG Generic drug: sacubitril-valsartan TAKE 1 TABLET BY MOUTH 2 (TWO) TIMES DAILY. MUST BE SEEN IN OFFICE FOR FURTHER REFILLS   ferrous sulfate 325 (65 FE) MG tablet Take 325 mg by mouth 2 (two) times daily.   fluticasone furoate-vilanterol 200-25 MCG/INH Aepb Commonly known as: BREO ELLIPTA Inhale 1 puff into the lungs daily.   furosemide 40 MG tablet Commonly known as: LASIX Take 0.5 tablets (20 mg total) by mouth daily.   hydrALAZINE 50 MG tablet Commonly known as: APRESOLINE TAKE 1 TABLET BY MOUTH 3  TIMES DAILY   ipratropium-albuterol 0.5-2.5 (3) MG/3ML Soln Commonly known as: DUONEB USE 1 VIAL IN NEBULIZER 4 TIMES DAILY   levothyroxine 112 MCG tablet Commonly known as: SYNTHROID TAKE 1 TABLET BY MOUTH  DAILY BEFORE BREAKFAST   loratadine 10 MG tablet Commonly known as: CLARITIN Take 10 mg by mouth daily as needed (for allergies.).   lovastatin 40 MG tablet Commonly known as: MEVACOR Take 2 tablets (80 mg total) by mouth at bedtime.   multivitamins ther. w/minerals Tabs tablet Take 1 tablet by mouth daily.   nitroGLYCERIN 0.4 MG SL tablet Commonly known as: NITROSTAT Place 1 tablet (0.4 mg total) under the tongue every 5 (five) minutes as needed for chest pain.   pantoprazole 40 MG tablet Commonly known as: PROTONIX Take 1 tablet (40  mg total) by mouth 2 (two) times daily.   spironolactone 25 MG tablet Commonly known as: ALDACTONE TAKE 1/2 TABLET BY MOUTH EVERY DAY   sucralfate 1 g tablet Commonly known as: CARAFATE TAKE  1 TABLET BY MOUTH 4  TIMES DAILY 1 HOUR BEFORE  MEALS AND AT BEDTIME   Vitamin D3 125 MCG (5000 UT) Caps Take 5,000 Units by mouth daily.   warfarin 5 MG tablet Commonly known as: COUMADIN Take as directed by the anticoagulation clinic. If you are unsure how to take this medication, talk to your nurse or doctor. Original instructions: TAKE 1 TABLET BY MOUTH  EVERY MONDAY, WEDNESDAY,  AND FRIDAY AND 1/2 TABLET  ON THE REMAINING DAYS OF  THE WEEK        Follow-up: Return in about 1 month (around 10/10/2020).  Claretta Fraise, M.D.

## 2020-09-10 LAB — CBC WITH DIFFERENTIAL/PLATELET
Basophils Absolute: 0.1 10*3/uL (ref 0.0–0.2)
Basos: 1 %
EOS (ABSOLUTE): 0.3 10*3/uL (ref 0.0–0.4)
Eos: 3 %
Hematocrit: 31.6 % — ABNORMAL LOW (ref 37.5–51.0)
Hemoglobin: 10.5 g/dL — ABNORMAL LOW (ref 13.0–17.7)
Immature Grans (Abs): 0 10*3/uL (ref 0.0–0.1)
Immature Granulocytes: 0 %
Lymphocytes Absolute: 1.1 10*3/uL (ref 0.7–3.1)
Lymphs: 12 %
MCH: 32.2 pg (ref 26.6–33.0)
MCHC: 33.2 g/dL (ref 31.5–35.7)
MCV: 97 fL (ref 79–97)
Monocytes Absolute: 1.1 10*3/uL — ABNORMAL HIGH (ref 0.1–0.9)
Monocytes: 11 %
Neutrophils Absolute: 6.7 10*3/uL (ref 1.4–7.0)
Neutrophils: 73 %
Platelets: 231 10*3/uL (ref 150–450)
RBC: 3.26 x10E6/uL — ABNORMAL LOW (ref 4.14–5.80)
RDW: 12 % (ref 11.6–15.4)
WBC: 9.4 10*3/uL (ref 3.4–10.8)

## 2020-09-10 LAB — CMP14+EGFR
ALT: 10 IU/L (ref 0–44)
AST: 16 IU/L (ref 0–40)
Albumin/Globulin Ratio: 1.8 (ref 1.2–2.2)
Albumin: 4 g/dL (ref 3.6–4.6)
Alkaline Phosphatase: 55 IU/L (ref 44–121)
BUN/Creatinine Ratio: 20 (ref 10–24)
BUN: 40 mg/dL — ABNORMAL HIGH (ref 8–27)
Bilirubin Total: 0.3 mg/dL (ref 0.0–1.2)
CO2: 22 mmol/L (ref 20–29)
Calcium: 9 mg/dL (ref 8.6–10.2)
Chloride: 105 mmol/L (ref 96–106)
Creatinine, Ser: 2.05 mg/dL — ABNORMAL HIGH (ref 0.76–1.27)
GFR calc Af Amer: 34 mL/min/{1.73_m2} — ABNORMAL LOW (ref >59)
GFR calc non Af Amer: 30 mL/min/{1.73_m2} — ABNORMAL LOW (ref >59)
Globulin, Total: 2.2 g/dL (ref 1.5–4.5)
Glucose: 93 mg/dL (ref 65–99)
Potassium: 4.7 mmol/L (ref 3.5–5.2)
Sodium: 141 mmol/L (ref 134–144)
Total Protein: 6.2 g/dL (ref 6.0–8.5)

## 2020-09-10 LAB — LIPID PANEL
Chol/HDL Ratio: 2.3 ratio (ref 0.0–5.0)
Cholesterol, Total: 96 mg/dL — ABNORMAL LOW (ref 100–199)
HDL: 41 mg/dL (ref >39)
LDL Chol Calc (NIH): 41 mg/dL (ref 0–99)
Triglycerides: 59 mg/dL (ref 0–149)
VLDL Cholesterol Cal: 14 mg/dL (ref 5–40)

## 2020-09-10 LAB — TSH+FREE T4
Free T4: 1.57 ng/dL (ref 0.82–1.77)
TSH: 2.19 u[IU]/mL (ref 0.450–4.500)

## 2020-09-12 NOTE — Progress Notes (Signed)
Hello Mandel,  Your lab result is normal and/or stable.Some minor variations that are not significant are commonly marked abnormal, but do not represent any medical problem for you.  Best regards, Claretta Fraise, M.D.

## 2020-09-17 DIAGNOSIS — J449 Chronic obstructive pulmonary disease, unspecified: Secondary | ICD-10-CM | POA: Diagnosis not present

## 2020-09-17 DIAGNOSIS — J45998 Other asthma: Secondary | ICD-10-CM | POA: Diagnosis not present

## 2020-09-27 DIAGNOSIS — J449 Chronic obstructive pulmonary disease, unspecified: Secondary | ICD-10-CM | POA: Diagnosis not present

## 2020-10-02 ENCOUNTER — Ambulatory Visit (INDEPENDENT_AMBULATORY_CARE_PROVIDER_SITE_OTHER): Payer: Medicare Other

## 2020-10-02 DIAGNOSIS — I255 Ischemic cardiomyopathy: Secondary | ICD-10-CM

## 2020-10-02 DIAGNOSIS — I5022 Chronic systolic (congestive) heart failure: Secondary | ICD-10-CM

## 2020-10-02 LAB — CUP PACEART REMOTE DEVICE CHECK
Battery Remaining Longevity: 90 mo
Battery Remaining Percentage: 83 %
Brady Statistic RA Percent Paced: 98 %
Brady Statistic RV Percent Paced: 0 %
Date Time Interrogation Session: 20211230010100
HighPow Impedance: 59 Ohm
Implantable Lead Implant Date: 20180703
Implantable Lead Implant Date: 20180703
Implantable Lead Location: 753859
Implantable Lead Location: 753860
Implantable Lead Model: 293
Implantable Lead Model: 3830
Implantable Lead Serial Number: 433180
Implantable Pulse Generator Implant Date: 20180703
Lead Channel Impedance Value: 375 Ohm
Lead Channel Impedance Value: 768 Ohm
Lead Channel Setting Pacing Amplitude: 2.5 V
Lead Channel Setting Pacing Amplitude: 2.5 V
Lead Channel Setting Pacing Pulse Width: 0.4 ms
Lead Channel Setting Sensing Sensitivity: 0.5 mV
Pulse Gen Serial Number: 533886

## 2020-10-13 ENCOUNTER — Encounter: Payer: Self-pay | Admitting: Family Medicine

## 2020-10-13 ENCOUNTER — Ambulatory Visit (INDEPENDENT_AMBULATORY_CARE_PROVIDER_SITE_OTHER): Payer: Medicare Other | Admitting: Family Medicine

## 2020-10-13 ENCOUNTER — Other Ambulatory Visit: Payer: Self-pay

## 2020-10-13 VITALS — BP 125/62 | HR 59 | Temp 97.5°F | Ht 70.0 in | Wt 176.2 lb

## 2020-10-13 DIAGNOSIS — Z7901 Long term (current) use of anticoagulants: Secondary | ICD-10-CM

## 2020-10-13 DIAGNOSIS — I10 Essential (primary) hypertension: Secondary | ICD-10-CM | POA: Diagnosis not present

## 2020-10-13 DIAGNOSIS — I5022 Chronic systolic (congestive) heart failure: Secondary | ICD-10-CM | POA: Diagnosis not present

## 2020-10-13 DIAGNOSIS — I482 Chronic atrial fibrillation, unspecified: Secondary | ICD-10-CM

## 2020-10-13 DIAGNOSIS — I25708 Atherosclerosis of coronary artery bypass graft(s), unspecified, with other forms of angina pectoris: Secondary | ICD-10-CM | POA: Diagnosis not present

## 2020-10-13 DIAGNOSIS — Z95810 Presence of automatic (implantable) cardiac defibrillator: Secondary | ICD-10-CM

## 2020-10-13 DIAGNOSIS — J449 Chronic obstructive pulmonary disease, unspecified: Secondary | ICD-10-CM | POA: Diagnosis not present

## 2020-10-13 DIAGNOSIS — J4489 Other specified chronic obstructive pulmonary disease: Secondary | ICD-10-CM

## 2020-10-13 LAB — COAGUCHEK XS/INR WAIVED
INR: 3.3 — ABNORMAL HIGH (ref 0.9–1.1)
Prothrombin Time: 39.3 s

## 2020-10-13 MED ORDER — WARFARIN SODIUM 5 MG PO TABS: ORAL_TABLET | ORAL | 0 refills | Status: DC

## 2020-10-13 MED ORDER — FLUTICASONE FUROATE-VILANTEROL 200-25 MCG/INH IN AEPB
1.0000 | INHALATION_SPRAY | Freq: Every day | RESPIRATORY_TRACT | 2 refills | Status: DC
Start: 2020-10-13 — End: 2021-06-05

## 2020-10-13 MED ORDER — SUCRALFATE 1 G PO TABS: ORAL_TABLET | ORAL | 1 refills | Status: DC

## 2020-10-13 NOTE — Progress Notes (Signed)
Subjective:  Patient ID: [name] [name], male    DOB: 1941-06-25  Age: 80 y.o. MRN: [id number]  CC: Anticoagulation   HPI [name] [name] presents for Atrial fibrillation follow up. Pt. is treated with rate control and anticoagulation. Pt.  denies palpitations, rapid rate, chest pain, dyspnea and edema. There has been no bleeding from nose or gums. Pt. has not noticed blood with urine or stool.  Although there is routine bruising easily, it is not excessive.   Depression screen [facility] 2/9 10/13/2020 09/09/2020 07/14/2020  Decreased Interest 0 0 0  Down, Depressed, Hopeless 0 0 0  PHQ - 2 Score 0 0 0  Altered sleeping - - -  Tired, decreased energy - - -  Change in appetite - - -  Feeling bad or failure about yourself  - - -  Trouble concentrating - - -  Moving slowly or fidgety/restless - - -  Suicidal thoughts - - -  PHQ-9 Score - - -  Some recent data might be hidden    History [name] [name] has a past medical history of AICD (automatic cardioverter/defibrillator) present (04/05/2017), Anemia, Arthritis, Asthma, Atrial fibrillation ([place]), CAD (coronary artery disease), CHF (congestive heart failure) ([place]), Cholecystitis (11/2013), CKD (chronic kidney disease) (2015), COPD (chronic obstructive pulmonary disease) ([place]), Esophageal reflux, Gallstones, Gastric antral vascular ectasia (2013), Gout, Hepatomegaly, Hiatal hernia, History of blood transfusion (~ 2012), Hyperlipidemia, Hypothyroidism, Other and unspecified coagulation defects, Personal history of colonic polyps (05/29/2010), and Unspecified essential hypertension.   He has a past surgical history that includes Coronary angioplasty with stent (~ 2008; 07/08/2014); Coronary artery bypass graft (1998); Hernia repair; Umbilical hernia repair (1970's); left and right heart catheterization with coronary/graft angiogram (N/A, 07/09/2014); Colonoscopy (2013); Esophagogastroduodenoscopy (2013); [place] capsule study (2013); cholecystomy tube  (12/28/2014 - 01/06/2015); Esophagogastroduodenoscopy (N/A, 12/12/2015); Cardiac catheterization (N/A, 07/15/2016); and ICD IMPLANT (N/A, 04/05/2017).   His family history includes Early death in his brother; Heart attack in his brother; Heart disease in his brother and father; Hypertension in an other family member; Kidney disease in his mother; Leukemia in his mother; Stomach cancer in his sister and sister.He reports that he quit smoking about 23 years ago. His smoking use included cigarettes. He has a 42.00 pack-year smoking history. He has never used smokeless tobacco. He reports current alcohol use of about 1.0 standard drink of alcohol per week. He reports that he does not use drugs.    ROS Review of Systems  Constitutional: Negative for fever.  Respiratory: Negative for shortness of breath.   Cardiovascular: Negative for chest pain.  Musculoskeletal: Negative for arthralgias.  Skin: Negative for rash.  Neurological: Positive for numbness (Of feet).    Objective:  BP 125/62    Pulse (!) 59    Temp (!) 97.5 F (36.4 C) (Temporal)    Ht 5\' 10"  (1.778 m)    Wt 176 lb 3.2 oz (79.9 kg)    BMI 25.28 kg/m   BP Readings from Last 3 Encounters:  10/13/20 125/62  09/09/20 110/68  07/14/20 (!) 136/58    Wt Readings from Last 3 Encounters:  10/13/20 176 lb 3.2 oz (79.9 kg)  09/09/20 176 lb 4 oz (79.9 kg)  07/14/20 179 lb 3.2 oz (81.3 kg)     Physical Exam Vitals reviewed.  Constitutional:      Appearance: He is well-developed and well-nourished.  HENT:     Head: Normocephalic and atraumatic.     Right Ear: External ear normal.     Left  Ear: External ear normal.     Mouth/Throat:     Pharynx: No oropharyngeal exudate or posterior oropharyngeal erythema.  Eyes:     Pupils: Pupils are equal, round, and reactive to light.  Cardiovascular:     Rate and Rhythm: Normal rate and regular rhythm.     Pulses: Normal pulses.     Heart sounds: No murmur heard.   Pulmonary:     Effort: No  respiratory distress.     Breath sounds: Normal breath sounds.  Musculoskeletal:        General: No swelling, tenderness or deformity.     Cervical back: Normal range of motion and neck supple.     Right lower leg: No edema.     Left lower leg: No edema.  Neurological:     Mental Status: He is alert and oriented to person, place, and time.     Sensory: Sensory deficit (grossly intact for light touch at toes) present.       Assessment & Plan:   [name] [name] was seen today for anticoagulation.  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       I have discontinued [name] [name] "[name] [name]"'s amoxicillin-clavulanate. I am also having him maintain his multivitamins ther. w/minerals, Vitamin D3, ferrous sulfate, docusate sodium, loratadine, nitroGLYCERIN, Hypromell-Glycerin-Naphazoline, spironolactone, carvedilol, ipratropium-albuterol, hydrALAZINE, Entresto, albuterol, budesonide, furosemide, levothyroxine, lovastatin, pantoprazole, warfarin, sucralfate, and fluticasone furoate-vilanterol.  Allergies as of 10/13/2020   No Known Allergies     Medication List       Accurate as of October 13, 2020 10:38 AM. If you have any questions, ask your nurse or doctor.        STOP taking these medications    amoxicillin-clavulanate 875-125 MG tablet Commonly known as: AUGMENTIN Stopped by: Claretta Fraise, MD     TAKE these medications   albuterol 108 (90 Base) MCG/ACT inhaler Commonly known as: VENTOLIN HFA USE 2 INHALATIONS BY MOUTH  EVERY 6 HOURS AS NEEDED FOR SHORTNESS OF BREATH   budesonide 0.25 MG/2ML nebulizer solution Commonly known as: PULMICORT USE 1 VIAL  IN  NEBULIZER TWICE  DAILY - rinse mouth after treatment   carvedilol 6.25 MG tablet Commonly known as: COREG Take 1.5 tablets (9.375 mg total) by mouth 2 (two) times daily.   docusate sodium 100 MG capsule Commonly known as: COLACE Take 100 mg by mouth daily.   Entresto 97-103 MG Generic drug: sacubitril-valsartan TAKE 1 TABLET BY MOUTH 2 (TWO) TIMES DAILY. MUST BE SEEN IN OFFICE FOR FURTHER REFILLS   ferrous sulfate 325 (65 FE) MG tablet Take 325 mg by mouth 2 (two) times daily.   fluticasone furoate-vilanterol 200-25 MCG/INH Aepb Commonly known as: BREO ELLIPTA Inhale 1 puff into the lungs daily.  furosemide 40 MG tablet Commonly known as: LASIX Take 0.5 tablets (20 mg total) by mouth daily.   hydrALAZINE 50 MG tablet Commonly known as: APRESOLINE TAKE 1 TABLET BY MOUTH 3  TIMES DAILY   Hypromell-Glycerin-Naphazoline 0.8-0.25-0.012 % Soln Place 1-2 drops into both eyes 3 (three) times daily as needed (for dry eyes.).   ipratropium-albuterol 0.5-2.5 (3) MG/3ML Soln Commonly known as: DUONEB USE 1 VIAL IN NEBULIZER 4 TIMES DAILY   levothyroxine 112 MCG tablet Commonly known as: SYNTHROID TAKE 1 TABLET BY MOUTH  DAILY BEFORE BREAKFAST   loratadine 10 MG tablet Commonly known as: CLARITIN Take 10 mg by mouth daily as needed (for allergies.).   lovastatin 40 MG tablet Commonly known as: MEVACOR Take 2 tablets (80 mg total) by mouth at bedtime.   multivitamins ther. w/minerals Tabs tablet Take 1 tablet by mouth daily.   nitroGLYCERIN 0.4 MG SL tablet Commonly known as: NITROSTAT Place 1 tablet  (0.4 mg total) under the tongue every 5 (five) minutes as needed for chest pain.   pantoprazole 40 MG tablet Commonly known as: PROTONIX Take 1 tablet (40 mg total) by mouth 2 (two) times daily.   spironolactone 25 MG tablet Commonly known as: ALDACTONE TAKE 1/2 TABLET BY MOUTH EVERY DAY   sucralfate 1 g tablet Commonly known as: CARAFATE TAKE 1 TABLET BY MOUTH 4  TIMES DAILY 1 HOUR BEFORE  MEALS AND AT BEDTIME   Vitamin D3 125 MCG (5000 UT) Caps Take 5,000 Units by mouth daily.   warfarin 5 MG tablet Commonly known as: COUMADIN Take as directed by the anticoagulation clinic. If you are unsure how to take this medication, talk to your nurse or doctor. Original instructions: TAKE 1 TABLET BY MOUTH  EVERY MONDAY, WEDNESDAY,  AND FRIDAY AND 1/2 TABLET  ON THE REMAINING DAYS OF  THE WEEK      As result of his INR being above range, I asked him to increase greens in his diet for the next month.  That should be adequate to bring it back into range.  With regard to the numbness and tingling in his feet the exam looks good I do not think he has any serious damage or danger at this point.  We discussed work-up for this.  He does not want to pursue testing at this time.  Should it become more severe, will do NCV/ABI at that time.  He is following with cardiology for his multiple cardiac diagnoses.    Follow-up: Return in about 1 month (around 11/13/2020).  Claretta Fraise, M.D.

## 2020-10-16 NOTE — Progress Notes (Signed)
Remote ICD transmission.   

## 2020-10-17 ENCOUNTER — Other Ambulatory Visit: Payer: Self-pay | Admitting: Family Medicine

## 2020-10-17 DIAGNOSIS — E038 Other specified hypothyroidism: Secondary | ICD-10-CM

## 2020-10-23 ENCOUNTER — Ambulatory Visit (HOSPITAL_COMMUNITY)
Admission: RE | Admit: 2020-10-23 | Discharge: 2020-10-23 | Disposition: A | Discharge status: Home / Self Care | Payer: Medicare Other | Source: Ambulatory Visit | Attending: Cardiology | Admitting: Cardiology

## 2020-10-23 ENCOUNTER — Other Ambulatory Visit: Payer: Self-pay

## 2020-10-23 ENCOUNTER — Telehealth (HOSPITAL_COMMUNITY): Payer: Self-pay | Admitting: Pharmacist

## 2020-10-23 ENCOUNTER — Encounter (HOSPITAL_COMMUNITY): Payer: Self-pay | Admitting: Cardiology

## 2020-10-23 VITALS — BP 140/60 | HR 60 | Wt 175.0 lb

## 2020-10-23 DIAGNOSIS — E785 Hyperlipidemia, unspecified: Secondary | ICD-10-CM | POA: Insufficient documentation

## 2020-10-23 DIAGNOSIS — Z7901 Long term (current) use of anticoagulants: Secondary | ICD-10-CM | POA: Diagnosis not present

## 2020-10-23 DIAGNOSIS — I5022 Chronic systolic (congestive) heart failure: Secondary | ICD-10-CM | POA: Diagnosis not present

## 2020-10-23 DIAGNOSIS — G629 Polyneuropathy, unspecified: Secondary | ICD-10-CM | POA: Insufficient documentation

## 2020-10-23 DIAGNOSIS — Z951 Presence of aortocoronary bypass graft: Secondary | ICD-10-CM | POA: Insufficient documentation

## 2020-10-23 DIAGNOSIS — J449 Chronic obstructive pulmonary disease, unspecified: Secondary | ICD-10-CM | POA: Insufficient documentation

## 2020-10-23 DIAGNOSIS — Z7984 Long term (current) use of oral hypoglycemic drugs: Secondary | ICD-10-CM | POA: Insufficient documentation

## 2020-10-23 DIAGNOSIS — I482 Chronic atrial fibrillation, unspecified: Secondary | ICD-10-CM | POA: Diagnosis not present

## 2020-10-23 DIAGNOSIS — I2510 Atherosclerotic heart disease of native coronary artery without angina pectoris: Secondary | ICD-10-CM | POA: Insufficient documentation

## 2020-10-23 DIAGNOSIS — E039 Hypothyroidism, unspecified: Secondary | ICD-10-CM | POA: Diagnosis not present

## 2020-10-23 DIAGNOSIS — N183 Chronic kidney disease, stage 3 unspecified: Secondary | ICD-10-CM | POA: Diagnosis not present

## 2020-10-23 DIAGNOSIS — Z79899 Other long term (current) drug therapy: Secondary | ICD-10-CM | POA: Insufficient documentation

## 2020-10-23 DIAGNOSIS — I255 Ischemic cardiomyopathy: Secondary | ICD-10-CM | POA: Insufficient documentation

## 2020-10-23 DIAGNOSIS — I130 Hypertensive heart and chronic kidney disease with heart failure and stage 1 through stage 4 chronic kidney disease, or unspecified chronic kidney disease: Secondary | ICD-10-CM | POA: Diagnosis not present

## 2020-10-23 DIAGNOSIS — M109 Gout, unspecified: Secondary | ICD-10-CM | POA: Diagnosis not present

## 2020-10-23 DIAGNOSIS — Z7951 Long term (current) use of inhaled steroids: Secondary | ICD-10-CM | POA: Diagnosis not present

## 2020-10-23 DIAGNOSIS — Z87891 Personal history of nicotine dependence: Secondary | ICD-10-CM | POA: Diagnosis not present

## 2020-10-23 DIAGNOSIS — I739 Peripheral vascular disease, unspecified: Secondary | ICD-10-CM | POA: Diagnosis not present

## 2020-10-23 LAB — BASIC METABOLIC PANEL
Anion gap: 10 (ref 5–15)
BUN: 34 mg/dL — ABNORMAL HIGH (ref 8–23)
CO2: 28 mmol/L (ref 22–32)
Calcium: 9.4 mg/dL (ref 8.9–10.3)
Chloride: 102 mmol/L (ref 98–111)
Creatinine, Ser: 2.15 mg/dL — ABNORMAL HIGH (ref 0.61–1.24)
GFR, Estimated: 30 mL/min — ABNORMAL LOW (ref >60)
Glucose, Bld: 86 mg/dL (ref 70–99)
Potassium: 4.9 mmol/L (ref 3.5–5.1)
Sodium: 140 mmol/L (ref 135–145)

## 2020-10-23 LAB — VITAMIN B12: Vitamin B-12: 464 pg/mL (ref 180–914)

## 2020-10-23 MED ORDER — ENTRESTO 97-103 MG PO TABS: 1.0000 | ORAL_TABLET | Freq: Two times a day (BID) | ORAL | 3 refills | Status: DC

## 2020-10-23 MED ORDER — EMPAGLIFLOZIN 10 MG PO TABS: 10.0000 mg | ORAL_TABLET | Freq: Every day | ORAL | 11 refills | Status: DC

## 2020-10-23 NOTE — Telephone Encounter (Signed)
Sent in Manufacturer's Assistance application to [employer] for Entresto.    Application pending, will continue to follow.  Hailee Hollick, PharmD, BCPS, BCCP, CPP Heart Failure Clinic Pharmacist [phone]  

## 2020-10-23 NOTE — Progress Notes (Signed)
PCP: Dr. Livia Snellen Cardiology: Dr. Tamala Julian HF Cardiology: Dr. Aundra Dubin  80 y.o. with chronic atrial fibrillation, CAD s/p CABG, and chronic systolic CHF presents for CHF clinic followup.  Patient had CABG in 1998.  He has had chronic atrial fibrillation long-term.  Last echo in 6/17 showed EF 25-30%.  Most recent cardiac cath was in 10/17.  This showed occluded native vessels with occluded SVG-dLAD and SVG-RCA.  SVG-OM and LIMA-LAD were patent (LIMA had stable 85% ostial stenosis).  No interventional options, medically managed.  Cardiac output was preserved, filling pressures were mildly elevated.    Echo in 6/18 showed EF 30-35%.  Patient had [place] ICD placed in 7/18 with His bundle lead. Most recent echo in 11/19 showed EF up to 55-60%.   PFTs in 2/20 showed severe obstruction.   He presents today for followup of CHF and CAD.  He is short of breath walking < 1 block, this has been chronic/slowly progressive. Still golfing.  No orthopnea/PND.  No chest pain.  He has numbness in his feed and poor balance.  No claudication-type pain.     Labs (10/17): hgb 10.2, K 4.1, creatinine 1.3 Labs (11/17): K 4.3, creatinine 1.5, BNP 638, LDL 87 Labs (2/18): K 4.1, creatinine 1.16, BNP 589 Labs (3/18): BNP 947, K 4.3, creatinine 1.09 Labs (10/19): LDL 49 Labs (1/20): K 4.5, creatinine 1.78, hgb 11.7 Labs (12/21) : LDL 41, HDL 41, K 4.7, creatinine 2.05  PMH: 1. Gout 2. Hyperlipidemia 3. Lower GI bleed: Colonic AVMs. 4. Atrial fibrillation: Chronic 5. HTN 6. 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  - PFTs (2/20): FVC 62%, FEV1 44%, TLC 106% => severe obstructive airways disease.  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 CAD: CABG x 4 in 1998. - LHC (10/17) with total occlusion SVG-dLAD, total occlusion SVG-RCA (new), patent SVG-OM, 85% ostial LIMA-LAD (unchanged), total occlusion of native LAD, LCx, and RCA.  No interventional option.  11. Chronic systolic CHF:  Ischemic cardiomyopathy.   - Echo (6/17) with EF 25-30%, mild MR, mildly dilated RV with mildly decreased systolic function.   - RHC (10/17): mean RA 11, PA 49/14, mean PCWP 17, CI 3.84.  - Echo (6/18): EF 30-35%.  - [place] ICD with His bundle lead 7/18.  - Echo (11/19): EF 55-60%, mild asymmetric septal hypertrophy.  12. Sinus bradycardia 13. Carotid disease: 11/19 carotid dopplers with 60-79% BICA stenosis.  - Carotid dopplers (7/21): 60-79% BICA stenosis.  14. Peripheral neuropathy  SH: Married, quit smoking 1998, retired from [employer], lives in [place].    Family History  Problem Relation Age of Onset  . Leukemia Mother   . Kidney disease Mother        kidney removed   . Heart attack Brother   . Heart disease Brother   . Heart disease Father   . Stomach cancer Sister   . Stomach cancer Sister   . Early death Brother   . Hypertension Other        family   . Colon cancer Neg Hx    Review of systems complete and found to be negative unless listed in HPI.    Current Outpatient Medications  Medication Sig Dispense Refill  . albuterol (VENTOLIN HFA) 108 (90 Base) MCG/ACT inhaler USE 2 INHALATIONS BY MOUTH  EVERY 6 HOURS AS NEEDED FOR SHORTNESS OF BREATH 34 g 11  . budesonide (PULMICORT) 0.25 MG/2ML nebulizer solution USE 1 VIAL  IN  NEBULIZER TWICE  DAILY -  rinse mouth after treatment 60 mL 5  . carvedilol (COREG) 6.25 MG tablet Take 1.5 tablets (9.375 mg total) by mouth 2 (two) times daily. 270 tablet 3  . Cholecalciferol (VITAMIN D3) 5000 UNITS CAPS Take 5,000 Units by mouth daily.    Marland Kitchen docusate sodium (COLACE) 100 MG capsule Take 100 mg by mouth daily.    . empagliflozin (JARDIANCE) 10 MG TABS tablet Take 1 tablet (10 mg total) by mouth daily before breakfast. 30 tablet 11  . ferrous sulfate 325 (65 FE) MG tablet Take 325 mg by mouth 2 (two) times daily.    . fluticasone furoate-vilanterol (BREO ELLIPTA) 200-25 MCG/INH AEPB Inhale 1 puff into the lungs  daily. 28 each 2  . hydrALAZINE (APRESOLINE) 50 MG tablet TAKE 1 TABLET BY MOUTH 3  TIMES DAILY 270 tablet 3  . Hypromell-Glycerin-Naphazoline 0.8-0.25-0.012 % SOLN Place 1-2 drops into both eyes 3 (three) times daily as needed (for dry eyes.).    Marland Kitchen ipratropium-albuterol (DUONEB) 0.5-2.5 (3) MG/3ML SOLN USE 1 VIAL IN NEBULIZER 4 TIMES DAILY 120 mL 5  . levothyroxine (SYNTHROID) 112 MCG tablet TAKE 1 TABLET BY MOUTH  DAILY BEFORE BREAKFAST 90 tablet 3  . loratadine (CLARITIN) 10 MG tablet Take 10 mg by mouth daily as needed (for allergies.).     Marland Kitchen lovastatin (MEVACOR) 40 MG tablet Take 2 tablets (80 mg total) by mouth at bedtime. 180 tablet 3  . Multiple Vitamins-Minerals (MULTIVITAMINS THER. W/MINERALS) TABS Take 1 tablet by mouth daily.    . nitroGLYCERIN (NITROSTAT) 0.4 MG SL tablet Place 1 tablet (0.4 mg total) under the tongue every 5 (five) minutes as needed for chest pain. 6 tablet 1  . pantoprazole (PROTONIX) 40 MG tablet Take 1 tablet (40 mg total) by mouth 2 (two) times daily. 180 tablet 3  . spironolactone (ALDACTONE) 25 MG tablet TAKE 1/2 TABLET BY MOUTH EVERY DAY 45 tablet 3  . sucralfate (CARAFATE) 1 g tablet TAKE 1 TABLET BY MOUTH 4  TIMES DAILY 1 HOUR BEFORE  MEALS AND AT BEDTIME 360 tablet 1  . warfarin (COUMADIN) 5 MG tablet TAKE 1 TABLET BY MOUTH  EVERY MONDAY, WEDNESDAY,  AND FRIDAY AND 1/2 TABLET  ON THE REMAINING DAYS OF  THE WEEK 65 tablet 0  . sacubitril-valsartan (ENTRESTO) 97-103 MG Take 1 tablet by mouth 2 (two) times daily. 180 tablet 3   No current facility-administered medications for this encounter.   BP 140/60   Pulse 60   Wt 79.4 kg (175 lb)   SpO2 96%   BMI 25.11 kg/m    Wt Readings from Last 3 Encounters:  10/23/20 79.4 kg (175 lb)  10/13/20 79.9 kg (176 lb 3.2 oz)  09/09/20 79.9 kg (176 lb 4 oz)    General: NAD Neck: No JVD, no thyromegaly or thyroid nodule.  Lungs: Distant breath sounds.  CV: Nondisplaced PMI.  Heart regular S1/S2, no S3/S4, no  murmur.  No peripheral edema.  No carotid bruit.  Unable to palpate pedal pulses.  Abdomen: Soft, nontender, no hepatosplenomegaly, no distention.  Skin: Intact without lesions or rashes.  Neurologic: Alert and oriented x 3.  Psych: Normal affect. Extremities: No clubbing or cyanosis.  HEENT: Normal.   Assessment/Plan: 1. CAD: s/p CABG 1998.  Occluded native vessels, patent SVG-OM and LIMA-LAD (chronic 85% ostial LIMA stenosis).  No chest pain.  No good interventional options.  - No ASA given stable CAD and use of warfarin.    - Continue statin, LDL acceptable at last  check.  2. Chronic systolic CHF: Ischemic cardiomyopathy.  Echo (6/17) with EF 25-30%, mildly dilated RV with decreased RV systolic function. [place] with His bundle lead placed, 93% His pacing today.  Echo in 11/19 with EF up to 55-60%. NYHA class III symptoms, chronic.  Not volume overloaded by exam.  Dyspnea out of proportion to cardiac exam/findings.  I suspect that COPD is the major cause of his dyspnea.   - Continue Coreg 9.375 mg bid.  - Continue Entresto 97/103 bid.  - Continue spironolactone 12.5 daily, hold off on increase with EF back to normal range.  - Stop Lasix, start Jardiance 10 mg daily.  BMET today and 10 days.  - I will arrange for repeat echo to make sure that EF remains near-normal.   3. Atrial fibrillation: Chronic.  Unlikely to cardiovert with chronicity.  - Continue coumadin. 4. CKD stage 3:  - BMET today.   - I will replace Lasix with Jardiance (as above) given benefits for renal endpoints.  5. COPD: Severe obstruction on 2/20 PFTs.  As above, I think that his is likely the main cause of his dyspnea.  6. PAD: Difficult to palpate pedal pulses, numbness in feet but no classic claudication.  - Screen for PAD with ABIs.  7. Carotid stenosis: Repeat carotid dopplers in 7/22.   Followup in 6 months.   Loralie Champagne 10/23/2020

## 2020-10-23 NOTE — Patient Instructions (Addendum)
Labs done today. We will contact you only if your labs are abnormal.  STOP taking Lasix  START Jardiance 10mg  (1 tablet) by mouth daily.   No other medication changes were made. Please continue all current medications as prescribed.  Your physician recommends that you schedule a follow-up appointment in: 10 days for a lab only appointment and in 6 months for an appointment with Dr. Aundra Dubin. Please contact our office in June to schedule a July appointment.  Your physician has requested that you have an ankle brachial index (ABI). During this test an ultrasound and blood pressure cuff are used to evaluate the arteries that supply the arms and legs with blood. Allow thirty minutes for this exam. There are no restrictions or special instructions.  If you have any questions or concerns before your next appointment please send [place] a message through [place] or call our office at [phone].    TO LEAVE A MESSAGE FOR THE NURSE SELECT OPTION 2, PLEASE LEAVE A MESSAGE INCLUDING: . YOUR NAME . DATE OF BIRTH . CALL BACK NUMBER . REASON FOR CALL**this is important as we prioritize the call backs  YOU WILL RECEIVE A CALL BACK THE SAME DAY AS LONG AS YOU CALL BEFORE 4:00 PM   Do the following things EVERYDAY: 1) Weigh yourself in the morning before breakfast. Write it down and keep it in a log. 2) Take your medicines as prescribed 3) Eat low salt foods--Limit salt (sodium) to 2000 mg per day.  4) Stay as active as you can everyday 5) Limit all fluids for the day to less than 2 liters   At the [facility], you and your health needs are our priority. As part of our continuing mission to provide you with exceptional heart care, we have created designated Provider Care Teams. These Care Teams include your primary Cardiologist (physician) and Advanced Practice Providers (APPs- Physician Assistants and Nurse Practitioners) who all work together to provide you with the care you need, when  you need it.   You may see any of the following providers on your designated Care Team at your next follow up: Marland Kitchen Dr Glori Bickers . Dr Loralie Champagne . Darrick Grinder, NP . Lyda Jester, PA . Audry Riles, PharmD   Please be sure to bring in all your medications bottles to every appointment.

## 2020-10-27 ENCOUNTER — Other Ambulatory Visit (HOSPITAL_COMMUNITY): Payer: Self-pay | Admitting: Cardiology

## 2020-10-27 DIAGNOSIS — R0989 Other specified symptoms and signs involving the circulatory and respiratory systems: Secondary | ICD-10-CM

## 2020-10-27 DIAGNOSIS — I739 Peripheral vascular disease, unspecified: Secondary | ICD-10-CM

## 2020-10-28 DIAGNOSIS — J449 Chronic obstructive pulmonary disease, unspecified: Secondary | ICD-10-CM | POA: Diagnosis not present

## 2020-10-30 ENCOUNTER — Other Ambulatory Visit: Payer: Self-pay | Admitting: Family Medicine

## 2020-11-03 ENCOUNTER — Ambulatory Visit (HOSPITAL_COMMUNITY)
Admission: RE | Admit: 2020-11-03 | Discharge: 2020-11-03 | Disposition: A | Discharge status: Home / Self Care | Payer: Medicare Other | Source: Ambulatory Visit | Attending: Cardiology | Admitting: Cardiology

## 2020-11-03 ENCOUNTER — Ambulatory Visit (HOSPITAL_BASED_OUTPATIENT_CLINIC_OR_DEPARTMENT_OTHER)
Admission: RE | Admit: 2020-11-03 | Discharge: 2020-11-03 | Disposition: A | Discharge status: Home / Self Care | Payer: Medicare Other | Source: Ambulatory Visit | Attending: Cardiology | Admitting: Cardiology

## 2020-11-03 ENCOUNTER — Other Ambulatory Visit: Payer: Self-pay

## 2020-11-03 DIAGNOSIS — I5022 Chronic systolic (congestive) heart failure: Secondary | ICD-10-CM | POA: Insufficient documentation

## 2020-11-03 DIAGNOSIS — R0602 Shortness of breath: Secondary | ICD-10-CM | POA: Insufficient documentation

## 2020-11-03 DIAGNOSIS — Z87891 Personal history of nicotine dependence: Secondary | ICD-10-CM | POA: Insufficient documentation

## 2020-11-03 DIAGNOSIS — I081 Rheumatic disorders of both mitral and tricuspid valves: Secondary | ICD-10-CM | POA: Insufficient documentation

## 2020-11-03 DIAGNOSIS — J449 Chronic obstructive pulmonary disease, unspecified: Secondary | ICD-10-CM | POA: Insufficient documentation

## 2020-11-03 DIAGNOSIS — R0600 Dyspnea, unspecified: Secondary | ICD-10-CM | POA: Insufficient documentation

## 2020-11-03 DIAGNOSIS — Z951 Presence of aortocoronary bypass graft: Secondary | ICD-10-CM | POA: Insufficient documentation

## 2020-11-03 DIAGNOSIS — I110 Hypertensive heart disease with heart failure: Secondary | ICD-10-CM | POA: Diagnosis not present

## 2020-11-03 DIAGNOSIS — I2510 Atherosclerotic heart disease of native coronary artery without angina pectoris: Secondary | ICD-10-CM | POA: Insufficient documentation

## 2020-11-03 DIAGNOSIS — J45998 Other asthma: Secondary | ICD-10-CM | POA: Diagnosis not present

## 2020-11-03 LAB — BASIC METABOLIC PANEL
Anion gap: 7 (ref 5–15)
BUN: 25 mg/dL — ABNORMAL HIGH (ref 8–23)
CO2: 27 mmol/L (ref 22–32)
Calcium: 9.1 mg/dL (ref 8.9–10.3)
Chloride: 109 mmol/L (ref 98–111)
Creatinine, Ser: 2.13 mg/dL — ABNORMAL HIGH (ref 0.61–1.24)
GFR, Estimated: 31 mL/min — ABNORMAL LOW (ref >60)
Glucose, Bld: 96 mg/dL (ref 70–99)
Potassium: 4.7 mmol/L (ref 3.5–5.1)
Sodium: 143 mmol/L (ref 135–145)

## 2020-11-03 NOTE — Progress Notes (Signed)
  Echocardiogram 2D Echocardiogram has been performed.  [name] [name] 11/03/2020, 12:18 PM

## 2020-11-04 ENCOUNTER — Ambulatory Visit (INDEPENDENT_AMBULATORY_CARE_PROVIDER_SITE_OTHER): Payer: Medicare Other | Admitting: *Deleted

## 2020-11-04 DIAGNOSIS — Z0000 Encounter for general adult medical examination without abnormal findings: Secondary | ICD-10-CM | POA: Diagnosis not present

## 2020-11-04 LAB — ECHOCARDIOGRAM COMPLETE
Area-P 1/2: 4.39 cm2
Calc EF: 55 %
S' Lateral: 3.7 cm
Single Plane A2C EF: 54.4 %
Single Plane A4C EF: 54.8 %

## 2020-11-04 NOTE — Patient Instructions (Addendum)
  [place] Maintenance Summary and Written Plan of Care  Mr. Hammitt ,  Thank you for allowing me to perform your Medicare Annual Wellness Visit and for your ongoing commitment to your health.   Health Maintenance & Immunization History Health Maintenance  Topic Date Due  . TETANUS/TDAP  09/09/2021 (Originally 07/05/1960)  . INFLUENZA VACCINE  Completed  . COVID-19 Vaccine  Completed  . PNA vac Low Risk Adult  Completed   Immunization History  Administered Date(s) Administered  . Fluad Quad(high Dose 65+) 07/18/2019, 09/09/2020  . Influenza Whole 11/18/2010  . Influenza, High Dose Seasonal PF 07/20/2017, 07/18/2018  . Influenza,inj,Quad PF,6+ Mos 07/10/2014, 07/09/2015, 08/06/2016  . Moderna Sars-Covid-2 Vaccination 08/19/2020  . PFIZER(Purple Top)SARS-COV-2 Vaccination 10/24/2019, 11/11/2019  . Pneumococcal Conjugate-13 04/26/2014  . Pneumococcal Polysaccharide-23 01/16/2018    These are the patient goals that we discussed: Goals Addressed            This Visit's Progress   . AWV       11/04/2020 AWV Goal: Fall Prevention  . Over the next year, patient will decrease their risk for falls by: o Using assistive devices, such as a cane or walker, as needed o Identifying fall risks within their home and correcting them by: - Removing throw rugs - Adding handrails to stairs or ramps - Removing clutter and keeping a clear pathway throughout the home - Increasing light, especially at night - Adding shower handles/bars - Raising toilet seat o Identifying potential personal risk factors for falls: - Medication side effects - Incontinence/urgency - Vestibular dysfunction - Hearing loss - Musculoskeletal disorders - Neurological disorders - Orthostatic hypotension          This is a list of Health Maintenance Items that are overdue or due now: There are no preventive care reminders to display for this patient.   Orders/Referrals Placed  Today: No orders of the defined types were placed in this encounter.  (Contact our referral department at [phone] if you have not spoken with someone about your referral appointment within the next 5 days)    Follow-up Plan . Follow-up with Claretta Fraise, MD as planned . Discuss tetanus vaccine at next appointment

## 2020-11-04 NOTE — Progress Notes (Signed)
MEDICARE ANNUAL WELLNESS VISIT  11/04/2020  Telephone Visit Disclaimer This Medicare AWV was conducted by telephone due to national recommendations for restrictions regarding the COVID-19 Pandemic (e.g. social distancing).  I verified, using two identifiers, that I am speaking with [name] [name] or their authorized healthcare agent. I discussed the limitations, risks, security, and privacy concerns of performing an evaluation and management service by telephone and the potential availability of an in-person appointment in the future. The patient expressed understanding and agreed to proceed.  Location of Patient: Home Location of Provider (nurse):  [facility]  Subjective:    [name] [name] is a 80 y.o. male patient of Stacks, [name], MD who had a Medicare Annual Wellness Visit today via telephone. [name] [name] is Retired and lives with their spouse. He has 2 children.He reports that he is socially active and does interact with friends/family regularly. He is minimally physically active and enjoys playing golf, watching his grandson play baseball, and watching [place] play.  Patient Care Team: Claretta Fraise, MD as PCP - General (Family Medicine) Larey Dresser, MD as PCP - Advanced Heart Failure (Cardiology) Pyrtle, Lajuan Lines, MD as Consulting Physician (Gastroenterology) Belva Crome, MD as Consulting Physician (Cardiology) Evans Lance, MD as Consulting Physician (Cardiology)  Advanced Directives 11/04/2020 04/11/2019 11/08/2017 04/05/2017 10/07/2016 07/15/2016 06/14/2016  Does Patient Have a Medical Advance Directive? No No No No No No Yes  Type of Advance Directive - - - - - - Press photographer;Living will  Copy of [place] in Chart? - - - - - - No - copy requested  Would patient like information on creating a medical advance directive? No - Patient declined No - Patient declined Yes (MAU/Ambulatory/Procedural Areas - Information given)  No - Patient declined - No - patient declined information -    Hospital Utilization Over the Past 12 Months: # of hospitalizations or ER visits: 0 # of surgeries: 0  Review of Systems    Patient reports that his overall health is unchanged compared to last year.  History obtained from chart review and the patient  Patient Reported Readings (BP, Pulse, CBG, Weight, etc) none  Pain Assessment       Current Medications & Allergies (verified) Allergies as of 11/04/2020   No Known Allergies     Medication List       Accurate as of November 04, 2020  9:07 AM. If you have any questions, ask your nurse or doctor.        albuterol 108 (90 Base) MCG/ACT inhaler Commonly known as: VENTOLIN HFA USE 2 INHALATIONS BY MOUTH  EVERY 6 HOURS AS NEEDED FOR SHORTNESS OF BREATH   budesonide 0.25 MG/2ML nebulizer solution Commonly known as: PULMICORT USE 1 VIAL  IN  NEBULIZER TWICE  DAILY - rinse mouth after treatment   carvedilol 6.25 MG tablet Commonly known as: COREG Take 1.5 tablets (9.375 mg total) by mouth 2 (two) times daily.   docusate sodium 100 MG capsule Commonly known as: COLACE Take 100 mg by mouth daily.   empagliflozin 10 MG Tabs tablet Commonly known as: Jardiance Take 1 tablet (10 mg total) by mouth daily before breakfast.   Entresto 97-103 MG Generic drug: sacubitril-valsartan Take 1 tablet by mouth 2 (two) times daily.   ferrous sulfate 325 (65 FE) MG tablet Take 325 mg by mouth 2 (two) times daily.   fluticasone furoate-vilanterol 200-25 MCG/INH Aepb Commonly known as: BREO ELLIPTA Inhale 1  puff into the lungs daily.   hydrALAZINE 50 MG tablet Commonly known as: APRESOLINE TAKE 1 TABLET BY MOUTH 3  TIMES DAILY   Hypromell-Glycerin-Naphazoline 0.8-0.25-0.012 % Soln Place 1-2 drops into both eyes 3 (three) times daily as needed (for dry eyes.).   ipratropium-albuterol 0.5-2.5 (3) MG/3ML Soln Commonly known as: DUONEB USE 1 VIAL IN NEBULIZER 4 TIMES  DAILY   levothyroxine 112 MCG tablet Commonly known as: SYNTHROID TAKE 1 TABLET BY MOUTH  DAILY BEFORE BREAKFAST   loratadine 10 MG tablet Commonly known as: CLARITIN Take 10 mg by mouth daily as needed (for allergies.).   lovastatin 40 MG tablet Commonly known as: MEVACOR Take 2 tablets (80 mg total) by mouth at bedtime.   multivitamins ther. w/minerals Tabs tablet Take 1 tablet by mouth daily.   nitroGLYCERIN 0.4 MG SL tablet Commonly known as: NITROSTAT Place 1 tablet (0.4 mg total) under the tongue every 5 (five) minutes as needed for chest pain.   pantoprazole 40 MG tablet Commonly known as: PROTONIX Take 1 tablet (40 mg total) by mouth 2 (two) times daily.   spironolactone 25 MG tablet Commonly known as: ALDACTONE TAKE 1/2 TABLET BY MOUTH EVERY DAY   sucralfate 1 g tablet Commonly known as: CARAFATE TAKE 1 TABLET BY MOUTH 4  TIMES DAILY 1 HOUR BEFORE  MEALS AND AT BEDTIME   Vitamin D3 125 MCG (5000 UT) Caps Take 5,000 Units by mouth daily.   warfarin 5 MG tablet Commonly known as: COUMADIN Take as directed by the anticoagulation clinic. If you are unsure how to take this medication, talk to your nurse or doctor. Original instructions: TAKE 1 TABLET BY MOUTH  EVERY MONDAY, WEDNESDAY,  AND FRIDAY AND 1/2 TABLET  ON THE REMAINING DAYS OF  THE WEEK       History (reviewed): Past Medical History:  Diagnosis Date  . 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 Anemia   . Arthritis    "hips; back" (07/09/2014)  . Asthma   . Atrial fibrillation ([place])   . CAD (coronary artery disease)   . CHF (congestive heart failure) ([place])   . Cholecystitis 11/2013  . 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   . COPD (chronic obstructive pulmonary disease) ([facility])    on home oxygen, 2 liters at night10/2015  . Esophageal reflux   . Gallstones   . Gastric antral vascular ectasia 2013  . Gout   . Hepatomegaly   . Hiatal hernia   . History of blood  transfusion ~ 2012   "blood count dropped; had to get 3 units"  . Hyperlipidemia   . Hypothyroidism   . Other and unspecified coagulation defects   . Personal history of colonic polyps 05/29/2010   TUBULAR ADENOMA  . Unspecified essential hypertension    Past Surgical History:  Procedure Laterality Date  . CARDIAC CATHETERIZATION N/A 07/15/2016   Procedure: Right/Left Heart Cath and Coronary/Graft Angiography;  Surgeon: Belva Crome, MD;  Location: [facility];  Service: Cardiovascular;  Laterality: N/A;  . cholecystomy tube  12/28/2014 - 01/06/2015   tube clogged with debris, removed and IR unable to place new tube.   . COLONOSCOPY  2013   Dr. Sharlett Iles: no polyps or evidence of active bleeding  . CORONARY ANGIOPLASTY WITH STENT PLACEMENT  ~ 2008; 07/08/2014   "1 + 3"  . CORONARY ARTERY BYPASS GRAFT  1998   CABG X4  . ESOPHAGOGASTRODUODENOSCOPY  2013   Dr. 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 ESOPHAGOGASTRODUODENOSCOPY N/A 12/12/2015   Procedure: ESOPHAGOGASTRODUODENOSCOPY (EGD);  Surgeon: Gatha Mayer, MD;  Location: [facility];  Service: Endoscopy;  Laterality: N/A;  . GIVENS CAPSULE STUDY  2013   Dr. Sharlett Iles: AVM at 38 and blood at 30 minutes beyond first duodenal image but not actual lesion seen. If persistent IDA, bleeding, recommend enteroscopy with ablation   . HERNIA REPAIR    . ICD IMPLANT N/A 04/05/2017   Procedure: ICD Implant;  Surgeon: Evans Lance, MD;  Location: [facility];  Service: Cardiovascular;  Laterality: N/A;  . LEFT AND RIGHT HEART CATHETERIZATION WITH CORONARY/GRAFT ANGIOGRAM N/A 07/09/2014   Right and left heart cath, bare metal stent to SVG to RCA. Sinclair Grooms, MD;   . UMBILICAL HERNIA REPAIR  [phone]   Family History  Problem Relation Age of Onset  . Leukemia Mother   . Kidney disease Mother        kidney removed   . Heart attack Brother   . Heart disease Brother   . Heart  disease Father   . Stomach cancer Sister   . Stomach cancer Sister   . Early death Brother   . Hypertension Other        family   . Colon cancer Neg Hx    Social History   Socioeconomic History  . Marital status: Married    Spouse name: [name]  . Number of children: 2  . Years of education: 65  . Highest education level: High school graduate  Occupational History  . Occupation: retired    Comment: [employer]  Tobacco Use  . Smoking status: Former Smoker    Packs/day: 1.00    Years: 42.00    Pack years: 42.00    Types: Cigarettes    Quit date: 12/16/1996    Years since quitting: 23.9  . Smokeless tobacco: Never Used  Vaping Use  . Vaping Use: Never used  Substance and Sexual Activity  . Alcohol use: Yes    Alcohol/week: 1.0 standard drink    Types: 1 Cans of beer per week    Comment: 1 per month  . Drug use: No  . Sexual activity: Not Currently    Birth control/protection: None  Other Topics Concern  . Not on file  Social History Narrative  . Not on file   Social Determinants of Health   Financial Resource Strain: Low Risk   . Difficulty of Paying Living Expenses: Not hard at all  Food Insecurity: No Food Insecurity  . Worried About Charity fundraiser in the Last Year: Never true  . Ran Out of Food in the Last Year: Never true  Transportation Needs: No Transportation Needs  . Lack of Transportation (Medical): No  . Lack of Transportation (Non-Medical): No  Physical Activity: Sufficiently Active  . Days of Exercise per Week: 7 days  . Minutes of Exercise per Session: 60 min  Stress: No Stress Concern Present  . Feeling of Stress : Not at all  Social Connections: Socially Integrated  . Frequency of Communication with Friends and Family: More than three times a week  . Frequency of Social Gatherings with Friends and Family: More than three times a week  . Attends Religious Services: More than 4 times per year  . Active Member of Clubs or  Organizations: Yes  . Attends Archivist Meetings: More than 4 times per year  . Marital Status:  Married    Activities of Daily Living In your present state of health, do you have any difficulty performing the following activities: 11/04/2020 02/19/2020  Hearing? N N  Vision? N N  Difficulty concentrating or making decisions? N N  Walking or climbing stairs? N N  Dressing or bathing? N N  Doing errands, shopping? N N  Preparing Food and eating ? N N  Using the Toilet? N N  In the past six months, have you accidently leaked urine? N N  Do you have problems with loss of bowel control? N N  Managing your Medications? N N  Managing your Finances? N N  Housekeeping or managing your Housekeeping? N N  Some recent data might be hidden    Patient Education/ Literacy    Exercise Current Exercise Habits: Home exercise routine, Time (Minutes): 60, Frequency (Times/Week): 2, Weekly Exercise (Minutes/Week): 120, Intensity: Mild  Diet Patient reports consuming 2 meals a day and 1 snack(s) a day Patient reports that his primary diet is: Regular Patient reports that she does have regular access to food.   Depression Screen PHQ 2/9 Scores 11/04/2020 10/13/2020 09/09/2020 07/14/2020 05/19/2020 04/03/2020 02/22/2020  PHQ - 2 Score 0 0 0 0 0 0 0  PHQ- 9 Score - - - - - - -     Fall Risk Fall Risk  11/04/2020 10/13/2020 09/09/2020 07/14/2020 05/19/2020  Falls in the past year? 0 0 0 0 0  Number falls in past yr: - 0 - 0 -  Injury with Fall? - 0 - 0 -  Risk for fall due to : - No Fall Risks - No Fall Risks -  Follow up - Falls evaluation completed Falls evaluation completed Falls evaluation completed Falls evaluation completed     Objective:  [name] [name] seemed alert and oriented and he participated appropriately during our telephone visit.  Blood Pressure Weight BMI  BP Readings from Last 3 Encounters:  10/23/20 140/60  10/13/20 125/62  09/09/20 110/68   Wt Readings from Last 3  Encounters:  10/23/20 175 lb (79.4 kg)  10/13/20 176 lb 3.2 oz (79.9 kg)  09/09/20 176 lb 4 oz (79.9 kg)   BMI Readings from Last 1 Encounters:  10/23/20 25.11 kg/m    *Unable to obtain current vital signs, weight, and BMI due to telephone visit type  Hearing/Vision  . [name] [name] did not seem to have difficulty with hearing/understanding during the telephone conversation . Reports that he has not had a formal eye exam by an eye care professional within the past year . Reports that he has not had a formal hearing evaluation within the past year *Unable to fully assess hearing and vision during telephone visit type  Cognitive Function: 6CIT Screen 11/04/2020 04/11/2019  What Year? 0 points 0 points  What month? 0 points 0 points  What time? 0 points 0 points  Count back from 20 0 points 0 points  Months in reverse 0 points 0 points  Repeat phrase 2 points 0 points  Total Score 2 0   (Normal:0-7, Significant for Dysfunction: >8)  Normal Cognitive Function Screening: Yes   Immunization & Health Maintenance Record Immunization History  Administered Date(s) Administered  . Fluad Quad(high Dose 65+) 07/18/2019, 09/09/2020  . Influenza Whole 11/18/2010  . Influenza, High Dose Seasonal PF 07/20/2017, 07/18/2018  . Influenza,inj,Quad PF,6+ Mos 07/10/2014, 07/09/2015, 08/06/2016  . Moderna Sars-Covid-2 Vaccination 08/19/2020  . PFIZER(Purple Top)SARS-COV-2 Vaccination 10/24/2019, 11/11/2019  . Pneumococcal Conjugate-13 04/26/2014  . Pneumococcal Polysaccharide-23  01/16/2018    Health Maintenance  Topic Date Due  . TETANUS/TDAP  09/09/2021 (Originally 07/05/1960)  . INFLUENZA VACCINE  Completed  . COVID-19 Vaccine  Completed  . PNA vac Low Risk Adult  Completed       Assessment  This is a routine wellness examination for [name] [name].  Health Maintenance: Due or Overdue There are no preventive care reminders to display for this patient.  [name] [name] does not need a  referral for [employer] Assistance: Care Management:   no Social Work:    no Prescription Assistance:  no Nutrition/Diabetes Education:  no   Plan:  Personalized Goals Goals Addressed            This Visit's Progress   . AWV       11/04/2020 AWV Goal: Fall Prevention  . Over the next year, patient will decrease their risk for falls by: o Using assistive devices, such as a cane or walker, as needed o Identifying fall risks within their home and correcting them by: - Removing throw rugs - Adding handrails to stairs or ramps - Removing clutter and keeping a clear pathway throughout the home - Increasing light, especially at night - Adding shower handles/bars - Raising toilet seat o Identifying potential personal risk factors for falls: - Medication side effects - Incontinence/urgency - Vestibular dysfunction - Hearing loss - Musculoskeletal disorders - Neurological disorders - Orthostatic hypotension        Personalized Health Maintenance & Screening Recommendations  Td vaccine  Lung Cancer Screening Recommended: no (Low Dose CT Chest recommended if Age 25-80 years, 30 pack-year currently smoking OR have quit w/in past 15 years) Hepatitis C Screening recommended: no HIV Screening recommended: no  Advanced Directives: Written information was not prepared per patient's request.  Referrals & Orders No orders of the defined types were placed in this encounter.   Follow-up Plan . Follow-up with Claretta Fraise, MD as planned . Discuss tetanus vaccine at next appointment . AVS printed and mailed to patient    I have personally reviewed and noted the following in the patient's chart:   . Medical and social history . Use of alcohol, tobacco or illicit drugs  . Current medications and supplements . Functional ability and status . Nutritional status . Physical activity . Advanced directives . List of other physicians . Hospitalizations, surgeries, and ER visits in  previous 12 months . Vitals . Screenings to include cognitive, depression, and falls . Referrals and appointments  In addition, I have reviewed and discussed with [name] [name] certain preventive protocols, quality metrics, and best practice recommendations. A written personalized care plan for preventive services as well as general preventive health recommendations is available and can be mailed to the patient at his request.      Lynnea Ferrier, LPN  [phone]/[phone]

## 2020-11-10 ENCOUNTER — Ambulatory Visit (HOSPITAL_COMMUNITY): Payer: Medicare Other

## 2020-11-14 ENCOUNTER — Other Ambulatory Visit: Payer: Self-pay

## 2020-11-14 ENCOUNTER — Telehealth: Payer: Self-pay

## 2020-11-14 ENCOUNTER — Ambulatory Visit (HOSPITAL_COMMUNITY)
Admission: RE | Admit: 2020-11-14 | Discharge: 2020-11-14 | Disposition: A | Discharge status: Home / Self Care | Payer: Medicare Other | Source: Ambulatory Visit | Attending: Cardiovascular Disease | Admitting: Cardiovascular Disease

## 2020-11-14 DIAGNOSIS — R0989 Other specified symptoms and signs involving the circulatory and respiratory systems: Secondary | ICD-10-CM | POA: Insufficient documentation

## 2020-11-14 DIAGNOSIS — I739 Peripheral vascular disease, unspecified: Secondary | ICD-10-CM | POA: Diagnosis not present

## 2020-11-14 NOTE — Telephone Encounter (Signed)
[name] [name] called from [employer] stating that she was speaking with [name] [name] and [name] [name] to go over the medication adherence and was told by [name] that pt was taking 2 (40mg ) tablets of Losartan daily, but that Dr Livia Snellen changed it, and now pt only takes 1 (40 mg) tablets of Losartan daily.  [name] [name] wants to confirm the change, and if there was a change, wants to know if Dr Livia Snellen can send in new Rx with new directions, that way it doesn't look like pt is behind on taking his medication.  Can reach [place] at [phone], if needed.

## 2020-11-14 NOTE — Telephone Encounter (Signed)
Called and spoke with [name] at [facility] regarding prescription for Lovastatin.  Patient is to take Lovastatin 40mg  tablets- 2 tablets(80mg ) at bedtime.  [name] states that patient has only been taking 1 tablet at bedtime.  Also called and spoke with patient and patients wife.  Patient states that he is taking 2 tablets at bedtime.  Advised patient that he is taking lovastatin correctly and to continue taking as prescribed.

## 2020-11-18 ENCOUNTER — Telehealth (HOSPITAL_COMMUNITY): Payer: Self-pay

## 2020-11-18 DIAGNOSIS — I739 Peripheral vascular disease, unspecified: Secondary | ICD-10-CM

## 2020-11-18 NOTE — Telephone Encounter (Signed)
-----   Message from Larey Dresser, MD sent at 11/14/2020  4:50 PM EST ----- Severe bilateral PAD.  Needs appointment with vascular => Dr. Gwenlyn Found or Dr. Fletcher Anon.

## 2020-11-18 NOTE — Telephone Encounter (Signed)
Patient advised and verbalized understanding. Referral placed   Orders Placed This Encounter  Procedures  . Ambulatory referral to Cardiology    Referral Priority:   Routine    Referral Type:   Consultation    Referral Reason:   Specialty Services Required    Referred to Provider:   Wellington Hampshire, MD    Requested Specialty:   Cardiology    Number of Visits Requested:   1

## 2020-11-19 NOTE — Telephone Encounter (Signed)
Advanced Heart Failure Patient Advocate Encounter   Patient was approved to receive Entresto from [employer]   Patient ID: [id number]  Effective dates: 11/18/2020 through 10/03/2021     Audry Riles, PharmD, BCPS, BCCP, CPP Heart Failure Clinic Pharmacist [phone]

## 2020-11-24 ENCOUNTER — Ambulatory Visit (INDEPENDENT_AMBULATORY_CARE_PROVIDER_SITE_OTHER): Payer: Medicare Other | Admitting: Family Medicine

## 2020-11-24 ENCOUNTER — Encounter: Payer: Self-pay | Admitting: Family Medicine

## 2020-11-24 ENCOUNTER — Other Ambulatory Visit: Payer: Self-pay

## 2020-11-24 VITALS — BP 132/82 | HR 60 | Temp 96.6°F | Resp 20 | Ht 70.0 in | Wt 179.0 lb

## 2020-11-24 DIAGNOSIS — Z7901 Long term (current) use of anticoagulants: Secondary | ICD-10-CM | POA: Diagnosis not present

## 2020-11-24 DIAGNOSIS — I25708 Atherosclerosis of coronary artery bypass graft(s), unspecified, with other forms of angina pectoris: Secondary | ICD-10-CM

## 2020-11-24 DIAGNOSIS — I5022 Chronic systolic (congestive) heart failure: Secondary | ICD-10-CM

## 2020-11-24 DIAGNOSIS — I482 Chronic atrial fibrillation, unspecified: Secondary | ICD-10-CM

## 2020-11-24 LAB — COAGUCHEK XS/INR WAIVED
INR: 3.2 — ABNORMAL HIGH (ref 0.9–1.1)
Prothrombin Time: 37.9 s

## 2020-11-24 LAB — POCT INR: INR: 3.2 — AB (ref 2.0–3.0)

## 2020-11-24 MED ORDER — EMPAGLIFLOZIN 10 MG PO TABS: 10.0000 mg | ORAL_TABLET | Freq: Every day | ORAL | 11 refills | Status: DC

## 2020-11-24 NOTE — Progress Notes (Signed)
Subjective:  Patient ID: [name] [name], male    DOB: 05-Feb-1941  Age: 80 y.o. MRN: [id number]  CC: Coagulation Disorder   HPI [name] [name] presents for Atrial fibrillation follow up. Pt. is treated with rate control and anticoagulation. Pt.  denies palpitations, rapid rate, chest pain. There has been no bleeding from nose or gums. Pt. has not noticed blood with urine or stool.  Although there is routine bruising easily, it is not excessive. Patient continues to have issues with heart failure and edema.  There is a little bit of swelling in currently at baseline currently at baseline the left leg today.  He was recently placed on Jardiance by his cardiologist due to its new CHF indication for nondiabetics.    Depression screen [facility] 2/9 11/24/2020 11/04/2020 10/13/2020  Decreased Interest 0 0 0  Down, Depressed, Hopeless 0 0 0  PHQ - 2 Score 0 0 0  Altered sleeping - - -  Tired, decreased energy - - -  Change in appetite - - -  Feeling bad or failure about yourself  - - -  Trouble concentrating - - -  Moving slowly or fidgety/restless - - -  Suicidal thoughts - - -  PHQ-9 Score - - -  Some recent data might be hidden    History [name] [name] has a past medical history of AICD (automatic cardioverter/defibrillator) present (04/05/2017), Anemia, Arthritis, Asthma, Atrial fibrillation ([place]), CAD (coronary artery disease), CHF (congestive heart failure) ([place]), Cholecystitis (11/2013), CKD (chronic kidney disease) (2015), COPD (chronic obstructive pulmonary disease) ([place]), Esophageal reflux, Gallstones, Gastric antral vascular ectasia (2013), Gout, Heme positive stool, Hepatomegaly, Hiatal hernia, History of blood transfusion (~ 2012), Hyperlipidemia, Hypothyroidism, Other and unspecified coagulation defects, Personal history of colonic polyps (05/29/2010), and Unspecified essential hypertension.   He has a past surgical history that includes Coronary angioplasty with stent (~ 2008; 07/08/2014);  Coronary artery bypass graft (1998); Hernia repair; Umbilical hernia repair (1970's); left and right heart catheterization with coronary/graft angiogram (N/A, 07/09/2014); Colonoscopy (2013); Esophagogastroduodenoscopy (2013); Givens capsule study (2013); cholecystomy tube (12/28/2014 - 01/06/2015); Esophagogastroduodenoscopy (N/A, 12/12/2015); Cardiac catheterization (N/A, 07/15/2016); and ICD IMPLANT (N/A, 04/05/2017).   His family history includes Early death in his brother; Heart attack in his brother; Heart disease in his brother and father; Hypertension in an other family member; Kidney disease in his mother; Leukemia in his mother; Stomach cancer in his sister and sister.He reports that he quit smoking about 23 years ago. His smoking use included cigarettes. He has a 42.00 pack-year smoking history. He has never used smokeless tobacco. He reports current alcohol use of about 1.0 standard drink of alcohol per week. He reports that he does not use drugs.    ROS Review of Systems  Constitutional: Negative for fever.  Respiratory: Negative for shortness of breath (currently at baseline).   Cardiovascular: Positive for leg swelling. Negative for chest pain.  Musculoskeletal: Negative for arthralgias.  Skin: Negative for rash.    Objective:  BP 132/82   Pulse 60   Temp (!) 96.6 F (35.9 C) (Temporal)   Resp 20   Ht 5\' 10"  (1.778 m)   Wt 179 lb (81.2 kg)   SpO2 95%   BMI 25.68 kg/m   BP Readings from Last 3 Encounters:  11/24/20 132/82  10/23/20 140/60  10/13/20 125/62    Wt Readings from Last 3 Encounters:  11/24/20 179 lb (81.2 kg)  10/23/20 175 lb (79.4 kg)  10/13/20 176 lb 3.2 oz (79.9 kg)  Physical Exam Vitals reviewed.  Constitutional:      Appearance: He is well-developed and well-nourished.  HENT:     Head: Normocephalic and atraumatic.     Right Ear: Tympanic membrane and external ear normal. No decreased hearing noted.     Left Ear: Tympanic membrane and external  ear normal. No decreased hearing noted.     Mouth/Throat:     Pharynx: No oropharyngeal exudate or posterior oropharyngeal erythema.  Eyes:     Pupils: Pupils are equal, round, and reactive to light.  Cardiovascular:     Rate and Rhythm: Normal rate and regular rhythm.     Heart sounds: No murmur heard.   Pulmonary:     Effort: No respiratory distress.     Breath sounds: Normal breath sounds.  Abdominal:     General: Bowel sounds are normal.     Palpations: Abdomen is soft. There is no mass.     Tenderness: There is no abdominal tenderness.  Musculoskeletal:     Cervical back: Normal range of motion and neck supple.     Left lower leg: Edema (2+) present.  Neurological:     Mental Status: He is alert and oriented to person, place, and time.       Assessment & Plan:   [name] [name] was seen today for coagulation disorder.  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  Other orders -     POCT INR   He should continue his Coumadin as is.  Although the 3.2 is slightly above range.  I asked him to perhaps increase the vitamin K slightly and will check him again in a month.  Sooner if he has any signs or symptoms of bleeding and excessive bruising.    I am having [name] R. Lauture "[name]" maintain his multivitamins ther. w/minerals, Vitamin D3, ferrous sulfate, docusate sodium, loratadine, nitroGLYCERIN, Hypromell-Glycerin-Naphazoline, spironolactone, carvedilol, hydrALAZINE, albuterol, lovastatin, pantoprazole, warfarin, sucralfate, fluticasone furoate-vilanterol, levothyroxine, Entresto, budesonide, ipratropium-albuterol, and empagliflozin.  Allergies as of 11/24/2020   No  Known Allergies     Medication List       Accurate as of November 24, 2020  6:10 PM. If you have any questions, ask your nurse or doctor.        albuterol 108 (90 Base) MCG/ACT inhaler Commonly known as: VENTOLIN HFA USE 2 INHALATIONS BY MOUTH  EVERY 6 HOURS AS NEEDED FOR SHORTNESS OF BREATH   budesonide 0.25 MG/2ML nebulizer solution Commonly known as: PULMICORT USE 1 VIAL  IN  NEBULIZER TWICE  DAILY - rinse mouth after treatment   carvedilol 6.25 MG tablet Commonly known as: COREG Take 1.5 tablets (9.375 mg total) by mouth 2 (two) times daily.   docusate sodium 100 MG capsule Commonly known as: COLACE Take 100 mg by mouth daily.   empagliflozin 10 MG Tabs tablet Commonly known as: Jardiance Take 1 tablet (10 mg total) by mouth daily before breakfast.   Entresto 97-103 MG Generic drug: sacubitril-valsartan Take 1 tablet by mouth 2 (two) times daily.   ferrous sulfate 325 (65 FE) MG tablet Take 325 mg by mouth 2 (two) times daily.   fluticasone furoate-vilanterol 200-25 MCG/INH Aepb Commonly known as: BREO  ELLIPTA Inhale 1 puff into the lungs daily.   hydrALAZINE 50 MG tablet Commonly known as: APRESOLINE TAKE 1 TABLET BY MOUTH 3  TIMES DAILY   Hypromell-Glycerin-Naphazoline 0.8-0.25-0.012 % Soln Place 1-2 drops into both eyes 3 (three) times daily as needed (for dry eyes.).   ipratropium-albuterol 0.5-2.5 (3) MG/3ML Soln Commonly known as: DUONEB USE 1 VIAL IN NEBULIZER 4 TIMES DAILY   levothyroxine 112 MCG tablet Commonly known as: SYNTHROID TAKE 1 TABLET BY MOUTH  DAILY BEFORE BREAKFAST   loratadine 10 MG tablet Commonly known as: CLARITIN Take 10 mg by mouth daily as needed (for allergies.).   lovastatin 40 MG tablet Commonly known as: MEVACOR Take 2 tablets (80 mg total) by mouth at bedtime.   multivitamins ther. w/minerals Tabs tablet Take 1 tablet by mouth daily.   nitroGLYCERIN 0.4 MG SL tablet Commonly known as: NITROSTAT Place 1 tablet  (0.4 mg total) under the tongue every 5 (five) minutes as needed for chest pain.   pantoprazole 40 MG tablet Commonly known as: PROTONIX Take 1 tablet (40 mg total) by mouth 2 (two) times daily.   spironolactone 25 MG tablet Commonly known as: ALDACTONE TAKE 1/2 TABLET BY MOUTH EVERY DAY   sucralfate 1 g tablet Commonly known as: CARAFATE TAKE 1 TABLET BY MOUTH 4  TIMES DAILY 1 HOUR BEFORE  MEALS AND AT BEDTIME   Vitamin D3 125 MCG (5000 UT) Caps Take 5,000 Units by mouth daily.   warfarin 5 MG tablet Commonly known as: COUMADIN Take as directed by the anticoagulation clinic. If you are unsure how to take this medication, talk to your nurse or doctor. Original instructions: TAKE 1 TABLET BY MOUTH  EVERY MONDAY, WEDNESDAY,  AND FRIDAY AND 1/2 TABLET  ON THE REMAINING DAYS OF  THE WEEK        Follow-up: No follow-ups on file.  Claretta Fraise, M.D.

## 2020-11-25 ENCOUNTER — Ambulatory Visit: Payer: Medicare Other | Admitting: Cardiovascular Disease

## 2020-11-25 ENCOUNTER — Encounter: Payer: Self-pay | Admitting: Cardiovascular Disease

## 2020-11-25 VITALS — BP 121/47 | HR 61 | Ht 70.0 in | Wt 177.0 lb

## 2020-11-25 DIAGNOSIS — I10 Essential (primary) hypertension: Secondary | ICD-10-CM

## 2020-11-25 DIAGNOSIS — I739 Peripheral vascular disease, unspecified: Secondary | ICD-10-CM | POA: Diagnosis not present

## 2020-11-25 DIAGNOSIS — I2510 Atherosclerotic heart disease of native coronary artery without angina pectoris: Secondary | ICD-10-CM

## 2020-11-25 DIAGNOSIS — I5022 Chronic systolic (congestive) heart failure: Secondary | ICD-10-CM | POA: Diagnosis not present

## 2020-11-25 DIAGNOSIS — E785 Hyperlipidemia, unspecified: Secondary | ICD-10-CM

## 2020-11-25 DIAGNOSIS — I6523 Occlusion and stenosis of bilateral carotid arteries: Secondary | ICD-10-CM | POA: Diagnosis not present

## 2020-11-25 DIAGNOSIS — I482 Chronic atrial fibrillation, unspecified: Secondary | ICD-10-CM

## 2020-11-25 NOTE — Progress Notes (Signed)
Cardiology Office Note   Date:  11/25/2020   ID:  Hadyn, Blanck 04-Sep-1941, MRN [id number]  PCP:  Claretta Fraise, MD  Cardiologist:  Dr. Tamala Julian and Dr. Aundra Dubin  No chief complaint on file.     History of Present Illness: [name] [name] is a 80 y.o. male who was referred by Dr. Aundra Dubin for evaluation management of peripheral arterial disease. He has known history of coronary artery disease status post CABG, chronic systolic heart failure, chronic atrial fibrillation, carotid disease, stage III chronic kidney disease, COPD, essential hypertension and hyperlipidemia.  There is history of previous tobacco use.  He quit in 1998. He recently complained of numbness in feet and was found to have weak distal pulses.  He underwent noninvasive vascular studies which showed noncompressible vessels bilaterally with abnormal TBI. Duplex showed: right: Borderline stenosis in common iliac artery, moderate common femoral artery stenosis and mild to moderate SFA popliteal disease with one-vessel runoff below the knee via the anterior tibial artery. On the left, there was severe stenosis in the distal common iliac artery with peak velocity of 871, moderate common femoral artery disease with no significant infrainguinal disease as well and occluded peroneal artery.  He reports numbness from the knees down with weakness in both legs with walking.  The symptoms are equal in both sides.  He is also limited by significant dyspnea but no chest pain.  No lower extremity ulceration.  Past Medical History:  Diagnosis Date  . 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 Anemia   . Arthritis    "hips; back" (07/09/2014)  . Asthma   . Atrial fibrillation ([place])   . CAD (coronary artery disease)   . CHF (congestive heart failure) ([place])   . Cholecystitis 11/2013  . 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   . COPD (chronic obstructive pulmonary disease) (HCC)    on home oxygen, 2  liters at night10/2015  . Esophageal reflux   . Gallstones   . Gastric antral vascular ectasia 2013  . Gout   . Heme positive stool   . Hepatomegaly   . Hiatal hernia   . History of blood transfusion ~ 2012   "blood count dropped; had to get 3 units"  . Hyperlipidemia   . Hypothyroidism   . Other and unspecified coagulation defects   . Personal history of colonic polyps 05/29/2010   TUBULAR ADENOMA  . Unspecified essential hypertension     Past Surgical History:  Procedure Laterality Date  . CARDIAC CATHETERIZATION N/A 07/15/2016   Procedure: Right/Left Heart Cath and Coronary/Graft Angiography;  Surgeon: Belva Crome, MD;  Location: [facility];  Service: Cardiovascular;  Laterality: N/A;  . cholecystomy tube  12/28/2014 - 01/06/2015   tube clogged with debris, removed and IR unable to place new tube.   . COLONOSCOPY  2013   Dr. Sharlett Iles: no polyps or evidence of active bleeding  . CORONARY ANGIOPLASTY WITH STENT PLACEMENT  ~ 2008; 07/08/2014   "1 + 3"  . CORONARY ARTERY BYPASS GRAFT  1998   CABG X4  . ESOPHAGOGASTRODUODENOSCOPY  2013   Dr. 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 ESOPHAGOGASTRODUODENOSCOPY N/A 12/12/2015   Procedure: ESOPHAGOGASTRODUODENOSCOPY (EGD);  Surgeon: Gatha Mayer, MD;  Location: [facility];  Service: Endoscopy;  Laterality: N/A;  . GIVENS CAPSULE STUDY  2013   Dr. Sharlett Iles: AVM at 57 and blood at 30 minutes beyond first duodenal image  but not actual lesion seen. If persistent IDA, bleeding, recommend enteroscopy with ablation   . HERNIA REPAIR    . ICD IMPLANT N/A 04/05/2017   Procedure: ICD Implant;  Surgeon: Evans Lance, MD;  Location: [facility];  Service: Cardiovascular;  Laterality: N/A;  . LEFT AND RIGHT HEART CATHETERIZATION WITH CORONARY/GRAFT ANGIOGRAM N/A 07/09/2014   Right and left heart cath, bare metal stent to SVG to RCA. Sinclair Grooms, MD;   . UMBILICAL HERNIA  REPAIR  [phone]     Current Outpatient Medications  Medication Sig Dispense Refill  . albuterol (VENTOLIN HFA) 108 (90 Base) MCG/ACT inhaler USE 2 INHALATIONS BY MOUTH  EVERY 6 HOURS AS NEEDED FOR SHORTNESS OF BREATH 34 g 11  . budesonide (PULMICORT) 0.25 MG/2ML nebulizer solution USE 1 VIAL  IN  NEBULIZER TWICE  DAILY - rinse mouth after treatment 360 mL 1  . carvedilol (COREG) 6.25 MG tablet Take 1.5 tablets (9.375 mg total) by mouth 2 (two) times daily. 270 tablet 3  . Cholecalciferol (VITAMIN D3) 5000 UNITS CAPS Take 5,000 Units by mouth daily.    Marland Kitchen docusate sodium (COLACE) 100 MG capsule Take 100 mg by mouth daily.    . empagliflozin (JARDIANCE) 10 MG TABS tablet Take 1 tablet (10 mg total) by mouth daily before breakfast. 30 tablet 11  . ferrous sulfate 325 (65 FE) MG tablet Take 325 mg by mouth 2 (two) times daily.    . fluticasone furoate-vilanterol (BREO ELLIPTA) 200-25 MCG/INH AEPB Inhale 1 puff into the lungs daily. 28 each 2  . hydrALAZINE (APRESOLINE) 50 MG tablet TAKE 1 TABLET BY MOUTH 3  TIMES DAILY 270 tablet 3  . Hypromell-Glycerin-Naphazoline 0.8-0.25-0.012 % SOLN Place 1-2 drops into both eyes 3 (three) times daily as needed (for dry eyes.).    Marland Kitchen ipratropium-albuterol (DUONEB) 0.5-2.5 (3) MG/3ML SOLN USE 1 VIAL IN NEBULIZER 4 TIMES DAILY 1080 mL 1  . levothyroxine (SYNTHROID) 112 MCG tablet TAKE 1 TABLET BY MOUTH  DAILY BEFORE BREAKFAST 90 tablet 3  . loratadine (CLARITIN) 10 MG tablet Take 10 mg by mouth daily as needed (for allergies.).     Marland Kitchen lovastatin (MEVACOR) 40 MG tablet Take 2 tablets (80 mg total) by mouth at bedtime. 180 tablet 3  . Multiple Vitamins-Minerals (MULTIVITAMINS THER. W/MINERALS) TABS Take 1 tablet by mouth daily.    . nitroGLYCERIN (NITROSTAT) 0.4 MG SL tablet Place 1 tablet (0.4 mg total) under the tongue every 5 (five) minutes as needed for chest pain. 6 tablet 1  . pantoprazole (PROTONIX) 40 MG tablet Take 1 tablet (40 mg total) by mouth 2 (two) times  daily. 180 tablet 3  . sacubitril-valsartan (ENTRESTO) 97-103 MG Take 1 tablet by mouth 2 (two) times daily. 180 tablet 3  . spironolactone (ALDACTONE) 25 MG tablet TAKE 1/2 TABLET BY MOUTH EVERY DAY 45 tablet 3  . sucralfate (CARAFATE) 1 g tablet TAKE 1 TABLET BY MOUTH 4  TIMES DAILY 1 HOUR BEFORE  MEALS AND AT BEDTIME 360 tablet 1  . warfarin (COUMADIN) 5 MG tablet TAKE 1 TABLET BY MOUTH  EVERY MONDAY, WEDNESDAY,  AND FRIDAY AND 1/2 TABLET  ON THE REMAINING DAYS OF  THE WEEK 65 tablet 0   No current facility-administered medications for this visit.    Allergies:   Patient has no known allergies.    Social History:  The patient  reports that he quit smoking about 23 years ago. His smoking use included cigarettes. He has a  42.00 pack-year smoking history. He has never used smokeless tobacco. He reports current alcohol use of about 1.0 standard drink of alcohol per week. He reports that he does not use drugs.   Family History:  The patient's family history includes Early death in his brother; Heart attack in his brother; Heart disease in his brother and father; Hypertension in an other family member; Kidney disease in his mother; Leukemia in his mother; Stomach cancer in his sister and sister.    ROS:  Please see the history of present illness.   Otherwise, review of systems are positive for none.   All other systems are reviewed and negative.    PHYSICAL EXAM: VS:  BP (!) 121/47 (BP Location: Right Arm, Patient Position: Sitting)   Pulse 61   Ht 5\' 10"  (1.778 m)   Wt 177 lb (80.3 kg)   SpO2 93%   BMI 25.40 kg/m  , BMI Body mass index is 25.4 kg/m. GEN: Well nourished, well developed, in no acute distress  HEENT: normal  Neck: no JVD, carotid bruits, or masses Cardiac: Irregularly irregular; no murmurs, rubs, or gallops, mild edema  Respiratory:  clear to auscultation bilaterally, normal work of breathing GI: soft, nontender, nondistended, + BS MS: no deformity or atrophy  Skin:  warm and dry, no rash Neuro:  Strength and sensation are intact Psych: euthymic mood, full affect Vascular: Femoral pulses +1 on the right and barely palpable on the left.  Distal pulses are not palpable.  EKG:  EKG is not ordered today.    Recent Labs: 09/09/2020: ALT 10; Hemoglobin 10.5; Platelets 231; TSH 2.190 11/03/2020: BUN 25; Creatinine, Ser 2.13; Potassium 4.7; Sodium 143    Lipid Panel    Component Value Date/Time   CHOL 96 (L) 09/09/2020 0930   TRIG 59 09/09/2020 0930   HDL 41 09/09/2020 0930   CHOLHDL 2.3 09/09/2020 0930   CHOLHDL 3.6 09/06/2016 1124   VLDL 17 09/06/2016 1124   LDLCALC 41 09/09/2020 0930      Wt Readings from Last 3 Encounters:  11/25/20 177 lb (80.3 kg)  11/24/20 179 lb (81.2 kg)  10/23/20 175 lb (79.4 kg)       No flowsheet data found.    ASSESSMENT AND PLAN:  1.  Peripheral arterial disease: The patient has severe although atypical bilateral claudication manifested by weakness in both legs and numbness from the knees down.  His exam and vascular studies are consistent with severe iliac disease bilaterally.  I explained to him that this is approachable by endovascular means.  I discussed the procedure in details but shared with him the risk of contrast-induced nephropathy given that he does have chronic kidney disease with most recent GFR of 30.  The patient and his wife are very concerned about the possibility of contrast-induced nephropathy.  The other issue is that he is on warfarin and the procedure would require interruption of anticoagulation.  His chads vas score is very high. After discussion, he wants to attempt to increase his physical activities and walking and reevaluate his symptoms in 3 months.  I explained to him that if we proceed, we will plan on using CO2 in order to minimize contrast and he will require pre and post procedure hydration.  2.  Coronary artery disease: Currently with no anginal symptoms.  3.  Chronic systolic  heart failure: He had significant exertional dyspnea but appears to be euvolemic.  4.  Chronic atrial fibrillation on anticoagulation with warfarin.  Ventricular rate  is controlled  5.  Essential hypertension: Blood pressure is controlled  6.  Carotid disease: Most recent Doppler in July showed stable 60 to 79% stenosis bilaterally. The plan is to repeat Doppler in July of this year.  7.  Hyperlipidemia: Currently on lovastatin. Most recent lipid profile showed an LDL of 41.  Disposition:   FU with me in 3 months  Signed,  Kathlyn Sacramento, MD  11/25/2020 9:27 AM    [place]

## 2020-11-25 NOTE — Patient Instructions (Signed)
Medication Instructions:  No changes *If you need a refill on your cardiac medications before your next appointment, please call your pharmacy*   Lab Work: No changes If you have labs (blood work) drawn today and your tests are completely normal, you will receive your results only by: Marland Kitchen MyChart Message (if you have MyChart) OR . A paper copy in the mail If you have any lab test that is abnormal or we need to change your treatment, we will call you to review the results.   Testing/Procedures: None ordered   Follow-Up: At [facility], you and your health needs are our priority.  As part of our continuing mission to provide you with exceptional heart care, we have created designated Provider Care Teams.  These Care Teams include your primary Cardiologist (physician) and Advanced Practice Providers (APPs -  Physician Assistants and Nurse Practitioners) who all work together to provide you with the care you need, when you need it.  We recommend signing up for the patient portal called "MyChart".  Sign up information is provided on this After Visit Summary.  MyChart is used to connect with patients for Virtual Visits (Telemedicine).  Patients are able to view lab/test results, encounter notes, upcoming appointments, etc.  Non-urgent messages can be sent to your provider as well.   To learn more about what you can do with MyChart, go to [email or web address].    Your next appointment:   3 month(s)  The format for your next appointment:   In Person  Provider:   Kathlyn Sacramento, MD

## 2020-11-28 DIAGNOSIS — J449 Chronic obstructive pulmonary disease, unspecified: Secondary | ICD-10-CM | POA: Diagnosis not present

## 2020-12-12 ENCOUNTER — Other Ambulatory Visit (HOSPITAL_COMMUNITY): Payer: Self-pay

## 2020-12-12 ENCOUNTER — Other Ambulatory Visit: Payer: Self-pay | Admitting: *Deleted

## 2020-12-12 MED ORDER — SPIRONOLACTONE 25 MG PO TABS: 12.5000 mg | ORAL_TABLET | Freq: Every day | ORAL | 3 refills | Status: DC

## 2020-12-12 MED ORDER — PANTOPRAZOLE SODIUM 40 MG PO TBEC: 40.0000 mg | DELAYED_RELEASE_TABLET | Freq: Two times a day (BID) | ORAL | 2 refills | Status: DC

## 2020-12-16 ENCOUNTER — Other Ambulatory Visit (HOSPITAL_COMMUNITY): Payer: Self-pay

## 2020-12-16 MED ORDER — SPIRONOLACTONE 25 MG PO TABS: 12.5000 mg | ORAL_TABLET | Freq: Every day | ORAL | 3 refills | Status: DC

## 2020-12-17 DIAGNOSIS — J449 Chronic obstructive pulmonary disease, unspecified: Secondary | ICD-10-CM | POA: Diagnosis not present

## 2020-12-21 ENCOUNTER — Other Ambulatory Visit: Payer: Self-pay | Admitting: Family Medicine

## 2020-12-26 DIAGNOSIS — J449 Chronic obstructive pulmonary disease, unspecified: Secondary | ICD-10-CM | POA: Diagnosis not present

## 2021-01-01 ENCOUNTER — Ambulatory Visit (INDEPENDENT_AMBULATORY_CARE_PROVIDER_SITE_OTHER): Payer: Medicare Other | Admitting: Nurse Practitioner

## 2021-01-01 ENCOUNTER — Other Ambulatory Visit: Payer: Self-pay

## 2021-01-01 ENCOUNTER — Ambulatory Visit (INDEPENDENT_AMBULATORY_CARE_PROVIDER_SITE_OTHER): Payer: Medicare Other

## 2021-01-01 ENCOUNTER — Encounter: Payer: Self-pay | Admitting: Nurse Practitioner

## 2021-01-01 VITALS — BP 137/42 | HR 60 | Temp 97.3°F | Ht 70.0 in | Wt 179.8 lb

## 2021-01-01 DIAGNOSIS — I255 Ischemic cardiomyopathy: Secondary | ICD-10-CM | POA: Diagnosis not present

## 2021-01-01 DIAGNOSIS — S81801A Unspecified open wound, right lower leg, initial encounter: Secondary | ICD-10-CM

## 2021-01-01 LAB — CUP PACEART REMOTE DEVICE CHECK
Battery Remaining Longevity: 90 mo
Battery Remaining Percentage: 81 %
Brady Statistic RA Percent Paced: 98 %
Brady Statistic RV Percent Paced: 0 %
Date Time Interrogation Session: 20220331010000
HighPow Impedance: 66 Ohm
Implantable Lead Implant Date: 20180703
Implantable Lead Implant Date: 20180703
Implantable Lead Location: 753859
Implantable Lead Location: 753860
Implantable Lead Model: 293
Implantable Lead Model: 3830
Implantable Lead Serial Number: 433180
Implantable Pulse Generator Implant Date: 20180703
Lead Channel Impedance Value: 377 Ohm
Lead Channel Impedance Value: 761 Ohm
Lead Channel Setting Pacing Amplitude: 2.5 V
Lead Channel Setting Pacing Amplitude: 2.5 V
Lead Channel Setting Pacing Pulse Width: 0.4 ms
Lead Channel Setting Sensing Sensitivity: 0.5 mV
Pulse Gen Serial Number: 533886

## 2021-01-01 MED ORDER — CEPHALEXIN 500 MG PO CAPS: 500.0000 mg | ORAL_CAPSULE | Freq: Three times a day (TID) | ORAL | 0 refills | Status: DC

## 2021-01-01 NOTE — Patient Instructions (Signed)
Wound Care, Adult Taking care of your wound properly can help to prevent pain, infection, and scarring. It can also help your wound heal more quickly. Follow instructions from your health care provider about how to care for your wound. Supplies needed:  Soap and water.  Wound cleanser.  Gauze.  If needed, a clean bandage (dressing) or other type of wound dressing material to cover or place in the wound. Follow your health care provider's instructions about what dressing supplies to use.  Cream or ointment to apply to the wound, if told by your health care provider. How to care for your wound Cleaning the wound Ask your health care provider how to clean the wound. This may include:  Using mild soap and water or a wound cleanser.  Using a clean gauze to pat the wound dry after cleaning it. Do not rub or scrub the wound. Dressing care  Wash your hands with soap and water for at least 20 seconds before and after you change the dressing. If soap and water are not available, use hand sanitizer.  Change your dressing as told by your health care provider. This may include: ? Cleaning or rinsing out (irrigating) the wound. ? Placing a dressing over the wound or in the wound (packing). ? Covering the wound with an outer dressing.  Leave any stitches (sutures), skin glue, or adhesive strips in place. These skin closures may need to stay in place for 2 weeks or longer. If adhesive strip edges start to loosen and curl up, you may trim the loose edges. Do not remove adhesive strips completely unless your health care provider tells you to do that.  Ask your health care provider when you can leave the wound uncovered. Checking for infection Check your wound area every day for signs of infection. Check for:  More redness, swelling, or pain.  Fluid or blood.  Warmth.  Pus or a bad smell.   Follow these instructions at home Medicines  If you were prescribed an antibiotic medicine, cream, or  ointment, take or apply it as told by your health care provider. Do not stop using the antibiotic even if your condition improves.  If you were prescribed pain medicine, take it 30 minutes before you do any wound care or as told by your health care provider.  Take over-the-counter and prescription medicines only as told by your health care provider. Eating and drinking  Eat a diet that includes protein, vitamin A, vitamin C, and other nutrient-rich foods to help the wound heal. ? Foods rich in protein include meat, fish, eggs, dairy, beans, and nuts. ? Foods rich in vitamin A include carrots and dark green, leafy vegetables. ? Foods rich in vitamin C include citrus fruits, tomatoes, broccoli, and peppers.  Drink enough fluid to keep your urine pale yellow. General instructions  Do not take baths, swim, use a hot tub, or do anything that would put the wound underwater until your health care provider approves. Ask your health care provider if you may take showers. You may only be allowed to take sponge baths.  Do not scratch or pick at the wound. Keep it covered as told by your health care provider.  Return to your normal activities as told by your health care provider. Ask your health care provider what activities are safe for you.  Protect your wound from the sun when you are outside for the first 6 months, or for as long as told by your health care provider. Cover   up the scar area or apply sunscreen that has an SPF of at least 30.  Do not use any products that contain nicotine or tobacco, such as cigarettes, e-cigarettes, and chewing tobacco. These may delay wound healing. If you need help quitting, ask your health care provider.  Keep all follow-up visits as told by your health care provider. This is important. Contact a health care provider if:  You received a tetanus shot and you have swelling, severe pain, redness, or bleeding at the injection site.  Your pain is not controlled  with medicine.  You have any of these signs of infection: ? More redness, swelling, or pain around the wound. ? Fluid or blood coming from the wound. ? Warmth coming from the wound. ? Pus or a bad smell coming from the wound. ? A fever or chills.  You are nauseous or you vomit.  You are dizzy. Get help right away if:  You have a red streak of skin near the area around your wound.  Your wound has been closed with staples, sutures, skin glue, or adhesive strips and it begins to open up and separate.  Your wound is bleeding, and the bleeding does not stop with gentle pressure.  You have a rash.  You faint.  You have trouble breathing. These symptoms may represent a serious problem that is an emergency. Do not wait to see if the symptoms will go away. Get medical help right away. Call your local emergency services (911 in the [place] Do not drive yourself to the hospital. Summary  Always wash your hands with soap and water for at least 20 seconds before and after changing your dressing.  Change your dressing as told by your health care provider.  To help with healing, eat foods that are rich in protein, vitamin A, vitamin C, and other nutrients.  Check your wound every day for signs of infection. Contact your health care provider if you suspect that your wound is infected. This information is not intended to replace advice given to you by your health care provider. Make sure you discuss any questions you have with your health care provider. Document Revised: 07/06/2019 Document Reviewed: 07/06/2019 Elsevier Patient Education  2021 [employer] Inc.  

## 2021-01-01 NOTE — Progress Notes (Signed)
   Subjective:    Patient ID: [name] [name], male    DOB: 03-30-1941, 80 y.o.   MRN: [id number]   Chief Complaint: cut on leg (Right shin)   HPI Pt presents today with c/o a cut on his right shin from an accident on his treadmill on Sat. The cut is sore and red. He denies any fever or chills. The cut did not bleed "a lot." His wife is concerned because the site is sore and red. He has a history of PVD.    Review of Systems  Respiratory: Negative for cough, shortness of breath and wheezing.   Cardiovascular: Negative for chest pain and palpitations.  Gastrointestinal: Negative for constipation and diarrhea.  Genitourinary: Negative for difficulty urinating and dysuria.  Musculoskeletal: Negative for back pain and gait problem.  Neurological: Negative for dizziness, syncope and headaches.       Objective:   Physical Exam Cardiovascular:     Rate and Rhythm: Normal rate and regular rhythm.     Pulses:          Posterior tibial pulses are 1+ on the right side and 1+ on the left side.     Heart sounds: Normal heart sounds.  Musculoskeletal:     Cervical back: Normal range of motion and neck supple.     Right lower leg: Swelling present. Edema present.     Comments: RL extremity anterior abrasion; (+) edema and erythema.   Skin:    General: Skin is warm and dry.     Capillary Refill: Capillary refill takes less than 2 seconds.  Neurological:     Mental Status: He is alert and oriented to person, place, and time.  Psychiatric:        Behavior: Behavior normal.     Vitals:   01/01/21 1436 01/01/21 1439  BP: (!) 148/52 (!) 137/42  Pulse: 60 60  Temp: (!) 97.3 F (36.3 C)   SpO2: 94%       Assessment & Plan:    [place] R Geeting comes in today with chief complaint of cut on leg (Right shin)   Diagnosis and orders addressed:  1. Leg wound, right, initial encounter Clean wound with plain soap and water. Pat dry. Use triple antibiotic ointment and keep covered with  Tegaderm. Begin cephalexin.   - cephALEXin (KEFLEX) 500 MG capsule; Take 1 capsule (500 mg total) by mouth 3 (three) times daily.  Dispense: 21 capsule; Refill: 0   Labs pending Health Maintenance reviewed Diet and exercise encouraged  Follow up plan: PRN  Dollene Primrose, RN, BSN, FNP-Student  Mary-Margaret Hassell Done, FNP

## 2021-01-06 ENCOUNTER — Ambulatory Visit (INDEPENDENT_AMBULATORY_CARE_PROVIDER_SITE_OTHER): Payer: Medicare Other | Admitting: Family Medicine

## 2021-01-06 ENCOUNTER — Encounter: Payer: Self-pay | Admitting: Family Medicine

## 2021-01-06 ENCOUNTER — Other Ambulatory Visit: Payer: Self-pay

## 2021-01-06 VITALS — BP 130/51 | HR 73 | Temp 97.2°F | Ht 70.0 in | Wt 178.0 lb

## 2021-01-06 DIAGNOSIS — I2510 Atherosclerotic heart disease of native coronary artery without angina pectoris: Secondary | ICD-10-CM | POA: Diagnosis not present

## 2021-01-06 DIAGNOSIS — Z95810 Presence of automatic (implantable) cardiac defibrillator: Secondary | ICD-10-CM | POA: Diagnosis not present

## 2021-01-06 DIAGNOSIS — I482 Chronic atrial fibrillation, unspecified: Secondary | ICD-10-CM

## 2021-01-06 DIAGNOSIS — Z7901 Long term (current) use of anticoagulants: Secondary | ICD-10-CM

## 2021-01-06 DIAGNOSIS — E038 Other specified hypothyroidism: Secondary | ICD-10-CM

## 2021-01-06 DIAGNOSIS — N1832 Chronic kidney disease, stage 3b: Secondary | ICD-10-CM

## 2021-01-06 DIAGNOSIS — Z1211 Encounter for screening for malignant neoplasm of colon: Secondary | ICD-10-CM | POA: Diagnosis not present

## 2021-01-06 LAB — COAGUCHEK XS/INR WAIVED
INR: 3.1 — ABNORMAL HIGH (ref 0.9–1.1)
Prothrombin Time: 37.7 s

## 2021-01-06 LAB — POCT INR: INR: 3.1 — AB (ref 2.0–3.0)

## 2021-01-06 NOTE — Progress Notes (Signed)
Subjective:  Patient ID: [name] [name], male    DOB: September 16, 1941  Age: 80 y.o. MRN: [id number]  CC: Atrial Fibrillation   HPI [name] [name] presents for Atrial fibrillation follow up. Pt. is treated with rate control and anticoagulation. Pt.  denies palpitations, rapid rate, chest pain, dyspnea and edema. There has been no bleeding from nose or gums. Pt. has not noticed blood with urine or stool.  Although there is routine bruising easily, it is not excessive.   Due to CHF and ASCVD pt. Has implanted ICD. No symptoms of CHF including chest pain,  edema and dyspnea. No recent discharge of the ICD.   follow-up on  thyroid. The patient has a history of hypothyroidism for many years. It has been stable recently. Pt. denies any change in  voice, loss of hair, heat or cold intolerance. Energy level has been adequate to good. Patient denies constipation and diarrhea. No myxedema. Medication is as noted below. Verified that pt is taking it daily on an empty stomach. Well tolerated.     Depression screen [facility] 2/9 01/06/2021 01/06/2021 01/01/2021  Decreased Interest 0 0 0  Down, Depressed, Hopeless 0 0 0  PHQ - 2 Score 0 0 0  Altered sleeping - - -  Tired, decreased energy - - -  Change in appetite - - -  Feeling bad or failure about yourself  - - -  Trouble concentrating - - -  Moving slowly or fidgety/restless - - -  Suicidal thoughts - - -  PHQ-9 Score - - -  Some recent data might be hidden    History [name] [name] has a past medical history of AICD (automatic cardioverter/defibrillator) present (04/05/2017), Anemia, Arthritis, Asthma, Atrial fibrillation ([place]), CAD (coronary artery disease), CHF (congestive heart failure) ([place]), Cholecystitis (11/2013), CKD (chronic kidney disease) (2015), COPD (chronic obstructive pulmonary disease) ([place]), Esophageal reflux, Gallstones, Gastric antral vascular ectasia (2013), Gout, Heme positive stool, Hepatomegaly, Hiatal hernia, History of blood transfusion (~  2012), Hyperlipidemia, Hypothyroidism, Other and unspecified coagulation defects, Personal history of colonic polyps (05/29/2010), and Unspecified essential hypertension.   He has a past surgical history that includes Coronary angioplasty with stent (~ 2008; 07/08/2014); Coronary artery bypass graft (1998); Hernia repair; Umbilical hernia repair (1970's); left and right heart catheterization with coronary/graft angiogram (N/A, 07/09/2014); Colonoscopy (2013); Esophagogastroduodenoscopy (2013); Givens capsule study (2013); cholecystomy tube (12/28/2014 - 01/06/2015); Esophagogastroduodenoscopy (N/A, 12/12/2015); Cardiac catheterization (N/A, 07/15/2016); and ICD IMPLANT (N/A, 04/05/2017).   His family history includes Early death in his brother; Heart attack in his brother; Heart disease in his brother and father; Hypertension in an other family member; Kidney disease in his mother; Leukemia in his mother; Stomach cancer in his sister and sister.He reports that he quit smoking about 24 years ago. His smoking use included cigarettes. He has a 42.00 pack-year smoking history. He has never used smokeless tobacco. He reports current alcohol use of about 1.0 standard drink of alcohol per week. He reports that he does not use drugs.    ROS Review of Systems  Constitutional: Negative for fever.  Respiratory: Negative for shortness of breath.   Cardiovascular: Negative for chest pain.  Musculoskeletal: Negative for arthralgias.  Skin: Negative for rash.    Objective:  BP (!) 130/51   Pulse 73   Temp (!) 97.2 F (36.2 C)   Ht $R'5\' 10"'Xj$  (1.778 m)   Wt 178 lb (80.7 kg)   SpO2 94%   BMI 25.54 kg/m   BP Readings from Last  3 Encounters:  01/06/21 (!) 130/51  01/01/21 (!) 137/42  11/25/20 (!) 121/47    Wt Readings from Last 3 Encounters:  01/06/21 178 lb (80.7 kg)  01/01/21 179 lb 12.8 oz (81.6 kg)  11/25/20 177 lb (80.3 kg)     Physical Exam Vitals reviewed.  Constitutional:      Appearance: He is  well-developed.  HENT:     Head: Normocephalic and atraumatic.     Right Ear: External ear normal.     Left Ear: External ear normal.     Mouth/Throat:     Pharynx: No oropharyngeal exudate or posterior oropharyngeal erythema.  Eyes:     Pupils: Pupils are equal, round, and reactive to light.  Cardiovascular:     Rate and Rhythm: Normal rate and regular rhythm.     Heart sounds: No murmur heard.   Pulmonary:     Effort: No respiratory distress.     Breath sounds: Normal breath sounds.  Musculoskeletal:     Cervical back: Normal range of motion and neck supple.  Neurological:     Mental Status: He is alert and oriented to person, place, and time.       Assessment & Plan:   [name] [name] was seen today for atrial fibrillation.  Diagnoses and all orders for this visit:  Atrial fibrillation, chronic (HCC) -     CoaguChek XS/INR Waived -     CMP14+EGFR  Stage 3b chronic kidney disease (HCC) -     CMP14+EGFR  ICD (implantable cardioverter-defibrillator) in place -     CMP14+EGFR  Atherosclerosis of native coronary artery of native heart without angina pectoris -     CMP14+EGFR  Other specified hypothyroidism -     CMP14+EGFR  Chronic anticoagulation -     POCT INR -     CMP14+EGFR  Screen for colon cancer -     Cologuard       I am having [name] [name] "[name] [name]" maintain his multivitamins ther. w/minerals, Vitamin D3, ferrous sulfate, docusate sodium, loratadine, nitroGLYCERIN, Hypromell-Glycerin-Naphazoline, carvedilol, hydrALAZINE, lovastatin, warfarin, sucralfate, fluticasone furoate-vilanterol, levothyroxine, Entresto, budesonide, ipratropium-albuterol, empagliflozin, pantoprazole, spironolactone, albuterol, and cephALEXin.  Allergies as of 01/06/2021   No Known Allergies     Medication List       Accurate as of January 06, 2021  6:45 PM. If you have any questions, ask your nurse or doctor.        albuterol 108 (90 Base) MCG/ACT inhaler Commonly known as:  VENTOLIN HFA USE 2 INHALATIONS BY MOUTH  EVERY 6 HOURS AS NEEDED FOR SHORTNESS OF BREATH   budesonide 0.25 MG/2ML nebulizer solution Commonly known as: PULMICORT USE 1 VIAL  IN  NEBULIZER TWICE  DAILY - rinse mouth after treatment   carvedilol 6.25 MG tablet Commonly known as: COREG Take 1.5 tablets (9.375 mg total) by mouth 2 (two) times daily.   cephALEXin 500 MG capsule Commonly known as: Keflex Take 1 capsule (500 mg total) by mouth 3 (three) times daily.   docusate sodium 100 MG capsule Commonly known as: COLACE Take 100 mg by mouth daily.   empagliflozin 10 MG Tabs tablet Commonly known as: Jardiance Take 1 tablet (10 mg total) by mouth daily before breakfast.   Entresto 97-103 MG Generic drug: sacubitril-valsartan Take 1 tablet by mouth 2 (two) times daily.   ferrous sulfate 325 (65 FE) MG tablet Take 325 mg by mouth 2 (two) times daily.   fluticasone furoate-vilanterol 200-25 MCG/INH Aepb Commonly known as: BREO ELLIPTA Inhale  1 puff into the lungs daily.   hydrALAZINE 50 MG tablet Commonly known as: APRESOLINE TAKE 1 TABLET BY MOUTH 3  TIMES DAILY   Hypromell-Glycerin-Naphazoline 0.8-0.25-0.012 % Soln Place 1-2 drops into both eyes 3 (three) times daily as needed (for dry eyes.).   ipratropium-albuterol 0.5-2.5 (3) MG/3ML Soln Commonly known as: DUONEB USE 1 VIAL IN NEBULIZER 4 TIMES DAILY   levothyroxine 112 MCG tablet Commonly known as: SYNTHROID TAKE 1 TABLET BY MOUTH  DAILY BEFORE BREAKFAST   loratadine 10 MG tablet Commonly known as: CLARITIN Take 10 mg by mouth daily as needed (for allergies.).   lovastatin 40 MG tablet Commonly known as: MEVACOR Take 2 tablets (80 mg total) by mouth at bedtime.   multivitamins ther. w/minerals Tabs tablet Take 1 tablet by mouth daily.   nitroGLYCERIN 0.4 MG SL tablet Commonly known as: NITROSTAT Place 1 tablet (0.4 mg total) under the tongue every 5 (five) minutes as needed for chest pain.   pantoprazole  40 MG tablet Commonly known as: PROTONIX Take 1 tablet (40 mg total) by mouth 2 (two) times daily.   spironolactone 25 MG tablet Commonly known as: ALDACTONE Take 0.5 tablets (12.5 mg total) by mouth daily.   sucralfate 1 g tablet Commonly known as: CARAFATE TAKE 1 TABLET BY MOUTH 4  TIMES DAILY 1 HOUR BEFORE  MEALS AND AT BEDTIME   Vitamin D3 125 MCG (5000 UT) Caps Take 5,000 Units by mouth daily.   warfarin 5 MG tablet Commonly known as: COUMADIN Take as directed by the anticoagulation clinic. If you are unsure how to take this medication, talk to your nurse or doctor. Original instructions: TAKE 1 TABLET BY MOUTH  EVERY MONDAY, WEDNESDAY,  AND FRIDAY AND 1/2 TABLET  ON THE REMAINING DAYS OF  THE WEEK      Description   Take 1/2 tablet on Mondays, wednesdays and Fridays and 1 tablet all other days of the week.      INR 3.1 today              Follow-up: Return in about 1 month (around 02/05/2021) for CHF, anticoag check.  Claretta Fraise, M.D.

## 2021-01-07 LAB — CMP14+EGFR
ALT: 10 IU/L (ref 0–44)
AST: 16 IU/L (ref 0–40)
Albumin/Globulin Ratio: 2 (ref 1.2–2.2)
Albumin: 4.1 g/dL (ref 3.6–4.6)
Alkaline Phosphatase: 57 IU/L (ref 44–121)
BUN/Creatinine Ratio: 13 (ref 10–24)
BUN: 27 mg/dL (ref 8–27)
Bilirubin Total: 0.3 mg/dL (ref 0.0–1.2)
CO2: 23 mmol/L (ref 20–29)
Calcium: 9 mg/dL (ref 8.6–10.2)
Chloride: 106 mmol/L (ref 96–106)
Creatinine, Ser: 2.07 mg/dL — ABNORMAL HIGH (ref 0.76–1.27)
Globulin, Total: 2.1 g/dL (ref 1.5–4.5)
Glucose: 111 mg/dL — ABNORMAL HIGH (ref 65–99)
Potassium: 5 mmol/L (ref 3.5–5.2)
Sodium: 145 mmol/L — ABNORMAL HIGH (ref 134–144)
Total Protein: 6.2 g/dL (ref 6.0–8.5)
eGFR: 32 mL/min/{1.73_m2} — ABNORMAL LOW (ref >59)

## 2021-01-13 NOTE — Progress Notes (Signed)
Remote ICD transmission.   

## 2021-01-19 DIAGNOSIS — Z1211 Encounter for screening for malignant neoplasm of colon: Secondary | ICD-10-CM | POA: Diagnosis not present

## 2021-01-23 DIAGNOSIS — J449 Chronic obstructive pulmonary disease, unspecified: Secondary | ICD-10-CM | POA: Diagnosis not present

## 2021-01-26 DIAGNOSIS — J449 Chronic obstructive pulmonary disease, unspecified: Secondary | ICD-10-CM | POA: Diagnosis not present

## 2021-01-30 LAB — COLOGUARD: Cologuard: NEGATIVE

## 2021-02-09 ENCOUNTER — Other Ambulatory Visit: Payer: Self-pay

## 2021-02-09 ENCOUNTER — Ambulatory Visit (INDEPENDENT_AMBULATORY_CARE_PROVIDER_SITE_OTHER): Payer: Medicare Other | Admitting: Family Medicine

## 2021-02-09 ENCOUNTER — Encounter: Payer: Self-pay | Admitting: Family Medicine

## 2021-02-09 VITALS — BP 131/59 | HR 60 | Temp 96.9°F | Ht 70.0 in | Wt 172.6 lb

## 2021-02-09 DIAGNOSIS — Z95810 Presence of automatic (implantable) cardiac defibrillator: Secondary | ICD-10-CM

## 2021-02-09 DIAGNOSIS — I482 Chronic atrial fibrillation, unspecified: Secondary | ICD-10-CM

## 2021-02-09 LAB — COAGUCHEK XS/INR WAIVED
INR: 3.6 — ABNORMAL HIGH (ref 0.9–1.1)
Prothrombin Time: 43.7 s

## 2021-02-09 LAB — POCT INR: INR: 3.6 — AB (ref 2.0–3.0)

## 2021-02-09 NOTE — Progress Notes (Signed)
Subjective:  Patient ID: [name] [name], male    DOB: [birth year]  Age: 80 y.o. MRN: [id number]  CC: Atrial Fibrillation   HPI [name] [name] presents for Atrial fibrillation follow up. He has an implanted defibrillatorPt. is treated with rate control and anticoagulation. Pt.  denies palpitations, rapid rate, chest pain, dyspnea and edema. There has been no bleeding from nose or gums. Pt. has not noticed blood with urine or stool.  Although there is routine bruising easily, it is not excessive.   Depression screen [facility] 2/9 02/09/2021 01/06/2021 01/06/2021  Decreased Interest 0 0 0  Down, Depressed, Hopeless 0 0 0  PHQ - 2 Score 0 0 0  Altered sleeping - - -  Tired, decreased energy - - -  Change in appetite - - -  Feeling bad or failure about yourself  - - -  Trouble concentrating - - -  Moving slowly or fidgety/restless - - -  Suicidal thoughts - - -  PHQ-9 Score - - -  Some recent data might be hidden    History [name] [name] has a past medical history of AICD (automatic cardioverter/defibrillator) present (04/05/2017), Anemia, Arthritis, Asthma, Atrial fibrillation ([place]), CAD (coronary artery disease), CHF (congestive heart failure) ([place]), Cholecystitis (11/2013), CKD (chronic kidney disease) (2015), COPD (chronic obstructive pulmonary disease) ([place]), Esophageal reflux, Gallstones, Gastric antral vascular ectasia (2013), Gout, Heme positive stool, Hepatomegaly, Hiatal hernia, History of blood transfusion (~ 2012), Hyperlipidemia, Hypothyroidism, Other and unspecified coagulation defects, Personal history of colonic polyps (05/29/2010), and Unspecified essential hypertension.   He has a past surgical history that includes Coronary angioplasty with stent (~ 2008; 07/08/2014); Coronary artery bypass graft (1998); Hernia repair; Umbilical hernia repair (1970's); left and right heart catheterization with coronary/graft angiogram (N/A, 07/09/2014); Colonoscopy (2013); Esophagogastroduodenoscopy (2013);  Givens capsule study (2013); cholecystomy tube (12/28/2014 - 01/06/2015); Esophagogastroduodenoscopy (N/A, 12/12/2015); Cardiac catheterization (N/A, 07/15/2016); and ICD IMPLANT (N/A, 04/05/2017).   His family history includes Early death in his brother; Heart attack in his brother; Heart disease in his brother and father; Hypertension in an other family member; Kidney disease in his mother; Leukemia in his mother; Stomach cancer in his sister and sister.He reports that he quit smoking about 24 years ago. His smoking use included cigarettes. He has a 42.00 pack-year smoking history. He has never used smokeless tobacco. He reports current alcohol use of about 1.0 standard drink of alcohol per week. He reports that he does not use drugs.    ROS Review of Systems  Objective:  BP (!) 131/59   Pulse 60   Temp (!) 96.9 F (36.1 C)   Ht 5\' 10"  (1.778 m)   Wt 172 lb 9.6 oz (78.3 kg)   SpO2 94%   BMI 24.77 kg/m   BP Readings from Last 3 Encounters:  02/09/21 (!) 131/59  01/06/21 (!) 130/51  01/01/21 (!) 137/42    Wt Readings from Last 3 Encounters:  02/09/21 172 lb 9.6 oz (78.3 kg)  01/06/21 178 lb (80.7 kg)  01/01/21 179 lb 12.8 oz (81.6 kg)     Physical Exam    Assessment & Plan:   Drue was seen today for atrial fibrillation.  Diagnoses and all orders for this visit:  Atrial fibrillation, chronic ([place]) -     CoaguChek XS/INR Waived  ICD (implantable cardioverter-defibrillator) in place  Other orders -     POCT INR       I am having [name] R. Poe "[name]" maintain his multivitamins ther. w/minerals, Vitamin  D3, ferrous sulfate, docusate sodium, loratadine, nitroGLYCERIN, Hypromell-Glycerin-Naphazoline, carvedilol, hydrALAZINE, lovastatin, warfarin, sucralfate, fluticasone furoate-vilanterol, levothyroxine, Entresto, budesonide, ipratropium-albuterol, empagliflozin, pantoprazole, spironolactone, albuterol, and cephALEXin.  Allergies as of 02/09/2021   No Known  Allergies     Medication List       Accurate as of Feb 09, 2021 10:06 AM. If you have any questions, ask your nurse or doctor.        albuterol 108 (90 Base) MCG/ACT inhaler Commonly known as: VENTOLIN HFA USE 2 INHALATIONS BY MOUTH  EVERY 6 HOURS AS NEEDED FOR SHORTNESS OF BREATH   budesonide 0.25 MG/2ML nebulizer solution Commonly known as: PULMICORT USE 1 VIAL  IN  NEBULIZER TWICE  DAILY - rinse mouth after treatment   carvedilol 6.25 MG tablet Commonly known as: COREG Take 1.5 tablets (9.375 mg total) by mouth 2 (two) times daily.   cephALEXin 500 MG capsule Commonly known as: Keflex Take 1 capsule (500 mg total) by mouth 3 (three) times daily.   docusate sodium 100 MG capsule Commonly known as: COLACE Take 100 mg by mouth daily.   empagliflozin 10 MG Tabs tablet Commonly known as: Jardiance Take 1 tablet (10 mg total) by mouth daily before breakfast.   Entresto 97-103 MG Generic drug: sacubitril-valsartan Take 1 tablet by mouth 2 (two) times daily.   ferrous sulfate 325 (65 FE) MG tablet Take 325 mg by mouth 2 (two) times daily.   fluticasone furoate-vilanterol 200-25 MCG/INH Aepb Commonly known as: BREO ELLIPTA Inhale 1 puff into the lungs daily.   hydrALAZINE 50 MG tablet Commonly known as: APRESOLINE TAKE 1 TABLET BY MOUTH 3  TIMES DAILY   Hypromell-Glycerin-Naphazoline 0.8-0.25-0.012 % Soln Place 1-2 drops into both eyes 3 (three) times daily as needed (for dry eyes.).   ipratropium-albuterol 0.5-2.5 (3) MG/3ML Soln Commonly known as: DUONEB USE 1 VIAL IN NEBULIZER 4 TIMES DAILY   levothyroxine 112 MCG tablet Commonly known as: SYNTHROID TAKE 1 TABLET BY MOUTH  DAILY BEFORE BREAKFAST   loratadine 10 MG tablet Commonly known as: CLARITIN Take 10 mg by mouth daily as needed (for allergies.).   lovastatin 40 MG tablet Commonly known as: MEVACOR Take 2 tablets (80 mg total) by mouth at bedtime.   multivitamins ther. w/minerals Tabs tablet Take  1 tablet by mouth daily.   nitroGLYCERIN 0.4 MG SL tablet Commonly known as: NITROSTAT Place 1 tablet (0.4 mg total) under the tongue every 5 (five) minutes as needed for chest pain.   pantoprazole 40 MG tablet Commonly known as: PROTONIX Take 1 tablet (40 mg total) by mouth 2 (two) times daily.   spironolactone 25 MG tablet Commonly known as: ALDACTONE Take 0.5 tablets (12.5 mg total) by mouth daily.   sucralfate 1 g tablet Commonly known as: CARAFATE TAKE 1 TABLET BY MOUTH 4  TIMES DAILY 1 HOUR BEFORE  MEALS AND AT BEDTIME   Vitamin D3 125 MCG (5000 UT) Caps Take 5,000 Units by mouth daily.   warfarin 5 MG tablet Commonly known as: COUMADIN Take as directed by the anticoagulation clinic. If you are unsure how to take this medication, talk to your nurse or doctor. Original instructions: TAKE 1 TABLET BY MOUTH  EVERY MONDAY, WEDNESDAY,  AND FRIDAY AND 1/2 TABLET  ON THE REMAINING DAYS OF  THE WEEK        Follow-up: No follow-ups on file.  Claretta Fraise, M.D.

## 2021-02-16 ENCOUNTER — Other Ambulatory Visit: Payer: Self-pay | Admitting: *Deleted

## 2021-02-16 MED ORDER — CARVEDILOL 6.25 MG PO TABS: 9.3750 mg | ORAL_TABLET | Freq: Two times a day (BID) | ORAL | 0 refills | Status: DC

## 2021-02-24 ENCOUNTER — Ambulatory Visit: Payer: Medicare Other | Admitting: Cardiovascular Disease

## 2021-02-24 ENCOUNTER — Other Ambulatory Visit: Payer: Self-pay

## 2021-02-24 ENCOUNTER — Encounter: Payer: Self-pay | Admitting: Cardiovascular Disease

## 2021-02-24 VITALS — BP 148/58 | HR 60 | Ht 70.0 in | Wt 173.0 lb

## 2021-02-24 DIAGNOSIS — I739 Peripheral vascular disease, unspecified: Secondary | ICD-10-CM | POA: Diagnosis not present

## 2021-02-24 DIAGNOSIS — I5022 Chronic systolic (congestive) heart failure: Secondary | ICD-10-CM

## 2021-02-24 DIAGNOSIS — I2510 Atherosclerotic heart disease of native coronary artery without angina pectoris: Secondary | ICD-10-CM

## 2021-02-24 DIAGNOSIS — I482 Chronic atrial fibrillation, unspecified: Secondary | ICD-10-CM

## 2021-02-24 NOTE — Progress Notes (Signed)
Cardiology Office Note   Date:  02/24/2021   ID:  Radames, Mejorado 1941/01/21, MRN [id number]  PCP:  Claretta Fraise, MD  Cardiologist:  Dr. Tamala Julian and Dr. Aundra Dubin  No chief complaint on file.     History of Present Illness: [name] [name] is a 80 y.o. male who is here today for follow-up visit regarding peripheral arterial disease.   He has known history of coronary artery disease status post CABG, chronic systolic heart failure, chronic atrial fibrillation, carotid disease, stage III chronic kidney disease, COPD, essential hypertension and hyperlipidemia.  There is history of previous tobacco use.  He quit in 1998. He recently complained of numbness in feet and was found to have weak distal pulses.  He underwent noninvasive vascular studies in February which showed noncompressible vessels bilaterally with abnormal TBI. Duplex showed: right: Borderline stenosis in common iliac artery, moderate common femoral artery stenosis and mild to moderate SFA popliteal disease with one-vessel runoff below the knee via the anterior tibial artery. On the left, there was severe stenosis in the distal common iliac artery with peak velocity of 871, moderate common femoral artery disease with no significant infrainguinal disease as well and occluded peroneal artery.  He reports numbness from the knees down with weakness in both legs with walking.  The symptoms are equal in both sides.  We discussed the option of proceeding with angiography during initial evaluation but he was hesitant given underlying chronic kidney disease and the need to bridge anticoagulation.  He started going to the [employer] 6 days a week and has been doing biking and arm exercises for about 30 minutes.  He reports improvement in leg symptoms since then.  Past Medical History:  Diagnosis Date  . 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 Anemia   . Arthritis    "hips; back" (07/09/2014)  . Asthma   . Atrial  fibrillation ([place])   . CAD (coronary artery disease)   . CHF (congestive heart failure) ([place])   . Cholecystitis 11/2013  . 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   . COPD (chronic obstructive pulmonary disease) ([facility])    on home oxygen, 2 liters at night10/2015  . Esophageal reflux   . Gallstones   . Gastric antral vascular ectasia 2013  . Gout   . Heme positive stool   . Hepatomegaly   . Hiatal hernia   . History of blood transfusion ~ 2012   "blood count dropped; had to get 3 units"  . Hyperlipidemia   . Hypothyroidism   . Other and unspecified coagulation defects   . Personal history of colonic polyps 05/29/2010   TUBULAR ADENOMA  . Unspecified essential hypertension     Past Surgical History:  Procedure Laterality Date  . CARDIAC CATHETERIZATION N/A 07/15/2016   Procedure: Right/Left Heart Cath and Coronary/Graft Angiography;  Surgeon: Belva Crome, MD;  Location: [facility];  Service: Cardiovascular;  Laterality: N/A;  . cholecystomy tube  12/28/2014 - 01/06/2015   tube clogged with debris, removed and IR unable to place new tube.   . COLONOSCOPY  2013   Dr. Sharlett Iles: no polyps or evidence of active bleeding  . CORONARY ANGIOPLASTY WITH STENT PLACEMENT  ~ 2008; 07/08/2014   "1 + 3"  . CORONARY ARTERY BYPASS GRAFT  1998   CABG X4  . ESOPHAGOGASTRODUODENOSCOPY  2013   Dr. 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 ESOPHAGOGASTRODUODENOSCOPY N/A 12/12/2015  Procedure: ESOPHAGOGASTRODUODENOSCOPY (EGD);  Surgeon: Gatha Mayer, MD;  Location: [facility];  Service: Endoscopy;  Laterality: N/A;  . GIVENS CAPSULE STUDY  2013   Dr. Sharlett Iles: AVM at 57 and blood at 30 minutes beyond first duodenal image but not actual lesion seen. If persistent IDA, bleeding, recommend enteroscopy with ablation   . HERNIA REPAIR    . ICD IMPLANT N/A 04/05/2017   Procedure: ICD Implant;  Surgeon: Evans Lance, MD;  Location: [facility];  Service: Cardiovascular;  Laterality: N/A;  . LEFT AND RIGHT HEART CATHETERIZATION WITH CORONARY/GRAFT ANGIOGRAM N/A 07/09/2014   Right and left heart cath, bare metal stent to SVG to RCA. Sinclair Grooms, MD;   . UMBILICAL HERNIA REPAIR  [phone]     Current Outpatient Medications  Medication Sig Dispense Refill  . albuterol (VENTOLIN HFA) 108 (90 Base) MCG/ACT inhaler USE 2 INHALATIONS BY MOUTH  EVERY 6 HOURS AS NEEDED FOR SHORTNESS OF BREATH 34 g 0  . budesonide (PULMICORT) 0.25 MG/2ML nebulizer solution USE 1 VIAL  IN  NEBULIZER TWICE  DAILY - rinse mouth after treatment 360 mL 1  . carvedilol (COREG) 6.25 MG tablet Take 1.5 tablets (9.375 mg total) by mouth 2 (two) times daily. 270 tablet 0  . cephALEXin (KEFLEX) 500 MG capsule Take 1 capsule (500 mg total) by mouth 3 (three) times daily. 21 capsule 0  . Cholecalciferol (VITAMIN D3) 5000 UNITS CAPS Take 5,000 Units by mouth daily.    Marland Kitchen docusate sodium (COLACE) 100 MG capsule Take 100 mg by mouth daily.    . empagliflozin (JARDIANCE) 10 MG TABS tablet Take 1 tablet (10 mg total) by mouth daily before breakfast. 30 tablet 11  . ferrous sulfate 325 (65 FE) MG tablet Take 325 mg by mouth 2 (two) times daily.    . fluticasone furoate-vilanterol (BREO ELLIPTA) 200-25 MCG/INH AEPB Inhale 1 puff into the lungs daily. 28 each 2  . hydrALAZINE (APRESOLINE) 50 MG tablet TAKE 1 TABLET BY MOUTH 3  TIMES DAILY 270 tablet 3  . Hypromell-Glycerin-Naphazoline 0.8-0.25-0.012 % SOLN Place 1-2 drops into both eyes 3 (three) times daily as needed (for dry eyes.).    Marland Kitchen ipratropium-albuterol (DUONEB) 0.5-2.5 (3) MG/3ML SOLN USE 1 VIAL IN NEBULIZER 4 TIMES DAILY 1080 mL 1  . levothyroxine (SYNTHROID) 112 MCG tablet TAKE 1 TABLET BY MOUTH  DAILY BEFORE BREAKFAST 90 tablet 3  . loratadine (CLARITIN) 10 MG tablet Take 10 mg by mouth daily as needed (for allergies.).     Marland Kitchen lovastatin (MEVACOR) 40 MG tablet Take 2 tablets (80 mg total) by mouth at  bedtime. 180 tablet 3  . Multiple Vitamins-Minerals (MULTIVITAMINS THER. W/MINERALS) TABS Take 1 tablet by mouth daily.    . nitroGLYCERIN (NITROSTAT) 0.4 MG SL tablet Place 1 tablet (0.4 mg total) under the tongue every 5 (five) minutes as needed for chest pain. 6 tablet 1  . pantoprazole (PROTONIX) 40 MG tablet Take 1 tablet (40 mg total) by mouth 2 (two) times daily. 180 tablet 2  . sacubitril-valsartan (ENTRESTO) 97-103 MG Take 1 tablet by mouth 2 (two) times daily. 180 tablet 3  . spironolactone (ALDACTONE) 25 MG tablet Take 0.5 tablets (12.5 mg total) by mouth daily. 30 tablet 3  . sucralfate (CARAFATE) 1 g tablet TAKE 1 TABLET BY MOUTH 4  TIMES DAILY 1 HOUR BEFORE  MEALS AND AT BEDTIME 360 tablet 1  . warfarin (COUMADIN) 5 MG tablet TAKE 1 TABLET BY MOUTH  EVERY  MONDAY, WEDNESDAY,  AND FRIDAY AND 1/2 TABLET  ON THE REMAINING DAYS OF  THE WEEK 65 tablet 0   No current facility-administered medications for this visit.    Allergies:   Patient has no known allergies.    Social History:  The patient  reports that he quit smoking about 24 years ago. His smoking use included cigarettes. He has a 42.00 pack-year smoking history. He has never used smokeless tobacco. He reports current alcohol use of about 1.0 standard drink of alcohol per week. He reports that he does not use drugs.   Family History:  The patient's family history includes Early death in his brother; Heart attack in his brother; Heart disease in his brother and father; Hypertension in an other family member; Kidney disease in his mother; Leukemia in his mother; Stomach cancer in his sister and sister.    ROS:  Please see the history of present illness.   Otherwise, review of systems are positive for none.   All other systems are reviewed and negative.    PHYSICAL EXAM: VS:  BP (!) 148/58 (BP Location: Right Arm, Patient Position: Sitting, Cuff Size: Normal)   Pulse 60   Ht 5\' 10"  (1.778 m)   Wt 173 lb (78.5 kg)   BMI 24.82  kg/m  , BMI Body mass index is 24.82 kg/m. GEN: Well nourished, well developed, in no acute distress  HEENT: normal  Neck: no JVD, carotid bruits, or masses Cardiac: Irregularly irregular; no murmurs, rubs, or gallops, mild edema  Respiratory:  clear to auscultation bilaterally, normal work of breathing GI: soft, nontender, nondistended, + BS MS: no deformity or atrophy  Skin: warm and dry, no rash Neuro:  Strength and sensation are intact Psych: euthymic mood, full affect Vascular: Femoral pulses +1 on the right and barely palpable on the left.  Distal pulses are not palpable.  EKG:  EKG is ordered today. EKG showed A. fib with right bundle branch block.   Recent Labs: 09/09/2020: Hemoglobin 10.5; Platelets 231; TSH 2.190 01/06/2021: ALT 10; BUN 27; Creatinine, Ser 2.07; Potassium 5.0; Sodium 145    Lipid Panel    Component Value Date/Time   CHOL 96 (L) 09/09/2020 0930   TRIG 59 09/09/2020 0930   HDL 41 09/09/2020 0930   CHOLHDL 2.3 09/09/2020 0930   CHOLHDL 3.6 09/06/2016 1124   VLDL 17 09/06/2016 1124   LDLCALC 41 09/09/2020 0930      Wt Readings from Last 3 Encounters:  02/24/21 173 lb (78.5 kg)  02/09/21 172 lb 9.6 oz (78.3 kg)  01/06/21 178 lb (80.7 kg)       No flowsheet data found.    ASSESSMENT AND PLAN:  1.  Peripheral arterial disease:  His exam and vascular studies are consistent with severe iliac disease bilaterally.  He reports improvement in symptoms with an exercise program.  Currently with no evidence of critical limb ischemia.  His iliac disease is approachable percutaneously but given improvement in symptoms, will continue medical therapy for now.  2.  Coronary artery disease: Currently with no anginal symptoms.  3.  Chronic systolic heart failure: He had significant exertional dyspnea but appears to be euvolemic.  4.  Chronic atrial fibrillation on anticoagulation with warfarin.  Ventricular rate is controlled  5.  Essential hypertension:  Blood pressure is controlled  6.  Carotid disease: Most recent Doppler in July showed stable 60 to 79% stenosis bilaterally. The plan is to repeat Doppler in July of this year.  7.  Hyperlipidemia: Currently on lovastatin. Most recent lipid profile showed an LDL of 41.   Disposition:   FU with me in 6 months  Signed,  Kathlyn Sacramento, MD  02/24/2021 1:58 PM    [facility]

## 2021-02-24 NOTE — Patient Instructions (Signed)
Medication Instructions:  Your physician recommends that you continue on your current medications as directed. Please refer to the Current Medication list given to you today.  *If you need a refill on your cardiac medications before your next appointment, please call your pharmacy*  Follow-Up: At [facility], you and your health needs are our priority.  As part of our continuing mission to provide you with exceptional heart care, we have created designated Provider Care Teams.  These Care Teams include your primary Cardiologist (physician) and Advanced Practice Providers (APPs -  Physician Assistants and Nurse Practitioners) who all work together to provide you with the care you need, when you need it.  We recommend signing up for the patient portal called "MyChart".  Sign up information is provided on this After Visit Summary.  MyChart is used to connect with patients for Virtual Visits (Telemedicine).  Patients are able to view lab/test results, encounter notes, upcoming appointments, etc.  Non-urgent messages can be sent to your provider as well.   To learn more about what you can do with MyChart, go to [email or web address].    Your next appointment:   6 month(s)  The format for your next appointment:   In Person  Provider:   Kathlyn Sacramento, MD

## 2021-02-25 DIAGNOSIS — J449 Chronic obstructive pulmonary disease, unspecified: Secondary | ICD-10-CM | POA: Diagnosis not present

## 2021-03-02 ENCOUNTER — Other Ambulatory Visit: Payer: Self-pay | Admitting: Family Medicine

## 2021-03-06 DIAGNOSIS — J449 Chronic obstructive pulmonary disease, unspecified: Secondary | ICD-10-CM | POA: Diagnosis not present

## 2021-03-24 ENCOUNTER — Encounter: Payer: Self-pay | Admitting: Family Medicine

## 2021-03-24 ENCOUNTER — Ambulatory Visit (INDEPENDENT_AMBULATORY_CARE_PROVIDER_SITE_OTHER): Payer: Medicare Other | Admitting: Family Medicine

## 2021-03-24 ENCOUNTER — Other Ambulatory Visit: Payer: Self-pay

## 2021-03-24 VITALS — BP 139/48 | HR 60 | Temp 96.9°F | Ht 70.0 in | Wt 172.0 lb

## 2021-03-24 DIAGNOSIS — I482 Chronic atrial fibrillation, unspecified: Secondary | ICD-10-CM

## 2021-03-24 LAB — COAGUCHEK XS/INR WAIVED
INR: 2.6 — ABNORMAL HIGH (ref 0.9–1.1)
Prothrombin Time: 31 s

## 2021-03-24 NOTE — Progress Notes (Signed)
Subjective:  Patient ID: [name] [name], male    DOB: [birth year]  Age: 80 y.o. MRN: [id number]  CC: Atrial Fibrillation   HPI [name] [name] presents for Patient in for follow-up of atrial fibrillation. Patient denies any recent bouts of chest pain or palpitations. Additionally, patient is taking anticoagulants. Patient denies any recent excessive bleeding episodes including epistaxis, bleeding from the gums, genitalia, rectal bleeding or hematuria. Additionally there has been no excessive bruising.  Patient was sent by cardiology to a vascular doctor who tells him that has diminished circulation down around the ankles.  His feet do get tired and ache later in the day if he walks around very much.  At this time they do not want to do any type of stenting.  He has a 40-month follow-up to monitor for progression.  Depression screen [facility] 2/9 03/24/2021 02/09/2021 01/06/2021  Decreased Interest 0 0 0  Down, Depressed, Hopeless 0 0 0  PHQ - 2 Score 0 0 0  Altered sleeping - - -  Tired, decreased energy - - -  Change in appetite - - -  Feeling bad or failure about yourself  - - -  Trouble concentrating - - -  Moving slowly or fidgety/restless - - -  Suicidal thoughts - - -  PHQ-9 Score - - -  Some recent data might be hidden    History [name] [name] has a past medical history of AICD (automatic cardioverter/defibrillator) present (04/05/2017), Anemia, Arthritis, Asthma, Atrial fibrillation ([place]), CAD (coronary artery disease), CHF (congestive heart failure) ([place]), Cholecystitis (11/2013), CKD (chronic kidney disease) (2015), COPD (chronic obstructive pulmonary disease) ([place]), Esophageal reflux, Gallstones, Gastric antral vascular ectasia (2013), Gout, Heme positive stool, Hepatomegaly, Hiatal hernia, History of blood transfusion (~ 2012), Hyperlipidemia, Hypothyroidism, Other and unspecified coagulation defects, Personal history of colonic polyps (05/29/2010), and Unspecified essential hypertension.    He has a past surgical history that includes Coronary angioplasty with stent (~ 2008; 07/08/2014); Coronary artery bypass graft (1998); Hernia repair; Umbilical hernia repair (1970's); left and right heart catheterization with coronary/graft angiogram (N/A, 07/09/2014); Colonoscopy (2013); Esophagogastroduodenoscopy (2013); Givens capsule study (2013); cholecystomy tube (12/28/2014 - 01/06/2015); Esophagogastroduodenoscopy (N/A, 12/12/2015); Cardiac catheterization (N/A, 07/15/2016); and ICD IMPLANT (N/A, 04/05/2017).   His family history includes Early death in his brother; Heart attack in his brother; Heart disease in his brother and father; Hypertension in an other family member; Kidney disease in his mother; Leukemia in his mother; Stomach cancer in his sister and sister.He reports that he quit smoking about 24 years ago. His smoking use included cigarettes. He has a 42.00 pack-year smoking history. He has never used smokeless tobacco. He reports current alcohol use of about 1.0 standard drink of alcohol per week. He reports that he does not use drugs.    ROS Review of Systems  Constitutional:  Negative for fever.  Respiratory:  Negative for shortness of breath.   Cardiovascular:  Negative for chest pain, palpitations and leg swelling.  Musculoskeletal:  Positive for arthralgias.   Objective:  BP (!) 139/48   Pulse 60   Temp (!) 96.9 F (36.1 C)   Ht 5\' 10"  (1.778 m)   Wt 172 lb (78 kg)   SpO2 96%   BMI 24.68 kg/m   BP Readings from Last 3 Encounters:  03/24/21 (!) 139/48  02/24/21 (!) 148/58  02/09/21 (!) 131/59    Wt Readings from Last 3 Encounters:  03/24/21 172 lb (78 kg)  02/24/21 173 lb (78.5 kg)  02/09/21 172  lb 9.6 oz (78.3 kg)     Physical Exam Vitals reviewed.  Constitutional:      Appearance: He is well-developed.  HENT:     Head: Normocephalic and atraumatic.     Right Ear: External ear normal.     Left Ear: External ear normal.     Mouth/Throat:     Pharynx:  No oropharyngeal exudate or posterior oropharyngeal erythema.  Eyes:     Pupils: Pupils are equal, round, and reactive to light.  Cardiovascular:     Rate and Rhythm: Normal rate and regular rhythm.     Heart sounds: No murmur heard. Pulmonary:     Effort: No respiratory distress.     Breath sounds: Normal breath sounds.  Musculoskeletal:     Cervical back: Normal range of motion and neck supple.  Neurological:     Mental Status: He is alert and oriented to person, place, and time.      Assessment & Plan:   [name] [name] was seen today for atrial fibrillation.  Diagnoses and all orders for this visit:  Atrial fibrillation, chronic ([place]) -     CoaguChek XS/INR Waived      I have discontinued Arty [employer]. Mcmurphy "[name]"'s cephALEXin. I am also having him maintain his multivitamins ther. w/minerals, Vitamin D3, ferrous sulfate, docusate sodium, loratadine, nitroGLYCERIN, Hypromell-Glycerin-Naphazoline, hydrALAZINE, lovastatin, warfarin, sucralfate, fluticasone furoate-vilanterol, levothyroxine, Entresto, empagliflozin, pantoprazole, spironolactone, albuterol, carvedilol, budesonide, and ipratropium-albuterol.  Allergies as of 03/24/2021   No Known Allergies      Medication List        Accurate as of March 24, 2021  9:57 AM. If you have any questions, ask your nurse or doctor.          STOP taking these medications    cephALEXin 500 MG capsule Commonly known as: Keflex Stopped by: Claretta Fraise, MD       TAKE these medications    albuterol 108 (90 Base) MCG/ACT inhaler Commonly known as: VENTOLIN HFA USE 2 INHALATIONS BY MOUTH  EVERY 6 HOURS AS NEEDED FOR SHORTNESS OF BREATH   budesonide 0.25 MG/2ML nebulizer solution Commonly known as: PULMICORT USE 1 VIAL  IN  NEBULIZER TWICE  DAILY - rinse mouth after treatment   carvedilol 6.25 MG tablet Commonly known as: COREG Take 1.5 tablets (9.375 mg total) by mouth 2 (two) times daily.   docusate sodium 100 MG  capsule Commonly known as: COLACE Take 100 mg by mouth daily.   empagliflozin 10 MG Tabs tablet Commonly known as: Jardiance Take 1 tablet (10 mg total) by mouth daily before breakfast.   Entresto 97-103 MG Generic drug: sacubitril-valsartan Take 1 tablet by mouth 2 (two) times daily.   ferrous sulfate 325 (65 FE) MG tablet Take 325 mg by mouth 2 (two) times daily.   fluticasone furoate-vilanterol 200-25 MCG/INH Aepb Commonly known as: BREO ELLIPTA Inhale 1 puff into the lungs daily.   hydrALAZINE 50 MG tablet Commonly known as: APRESOLINE TAKE 1 TABLET BY MOUTH 3  TIMES DAILY   Hypromell-Glycerin-Naphazoline 0.8-0.25-0.012 % Soln Place 1-2 drops into both eyes 3 (three) times daily as needed (for dry eyes.).   ipratropium-albuterol 0.5-2.5 (3) MG/3ML Soln Commonly known as: DUONEB USE 1 VIAL IN NEBULIZER 4 TIMES DAILY   levothyroxine 112 MCG tablet Commonly known as: SYNTHROID TAKE 1 TABLET BY MOUTH  DAILY BEFORE BREAKFAST   loratadine 10 MG tablet Commonly known as: CLARITIN Take 10 mg by mouth daily as needed (for allergies.).   lovastatin 40 MG  tablet Commonly known as: MEVACOR Take 2 tablets (80 mg total) by mouth at bedtime.   multivitamins ther. w/minerals Tabs tablet Take 1 tablet by mouth daily.   nitroGLYCERIN 0.4 MG SL tablet Commonly known as: NITROSTAT Place 1 tablet (0.4 mg total) under the tongue every 5 (five) minutes as needed for chest pain.   pantoprazole 40 MG tablet Commonly known as: PROTONIX Take 1 tablet (40 mg total) by mouth 2 (two) times daily.   spironolactone 25 MG tablet Commonly known as: ALDACTONE Take 0.5 tablets (12.5 mg total) by mouth daily.   sucralfate 1 g tablet Commonly known as: CARAFATE TAKE 1 TABLET BY MOUTH 4  TIMES DAILY 1 HOUR BEFORE  MEALS AND AT BEDTIME   Vitamin D3 125 MCG (5000 UT) Caps Take 5,000 Units by mouth daily.   warfarin 5 MG tablet Commonly known as: COUMADIN Take as directed by the  anticoagulation clinic. If you are unsure how to take this medication, talk to your nurse or doctor. Original instructions: TAKE 1 TABLET BY MOUTH  EVERY MONDAY, WEDNESDAY,  AND FRIDAY AND 1/2 TABLET  ON THE REMAINING DAYS OF  THE WEEK         Follow-up: Return in about 6 weeks (around 05/05/2021).  Claretta Fraise, M.D.

## 2021-03-28 DIAGNOSIS — J449 Chronic obstructive pulmonary disease, unspecified: Secondary | ICD-10-CM | POA: Diagnosis not present

## 2021-03-30 DIAGNOSIS — J449 Chronic obstructive pulmonary disease, unspecified: Secondary | ICD-10-CM | POA: Diagnosis not present

## 2021-03-31 ENCOUNTER — Other Ambulatory Visit: Payer: Self-pay | Admitting: Family Medicine

## 2021-03-31 DIAGNOSIS — Z1159 Encounter for screening for other viral diseases: Secondary | ICD-10-CM | POA: Diagnosis not present

## 2021-03-31 NOTE — Telephone Encounter (Signed)
  Prescription Request  03/31/2021  What is the name of the medication or equipment? Albuterol  Have you contacted your pharmacy to request a refill? (if applicable) yes  Which pharmacy would you like this sent to? [facility]   Pts wife called stating that pt is completely out of his Albuterol and needs refills called in ASAP.

## 2021-04-02 ENCOUNTER — Ambulatory Visit: Payer: Medicare Other

## 2021-04-02 DIAGNOSIS — I255 Ischemic cardiomyopathy: Secondary | ICD-10-CM

## 2021-04-06 LAB — CUP PACEART REMOTE DEVICE CHECK
Battery Remaining Longevity: 90 mo
Battery Remaining Percentage: 80 %
Brady Statistic RA Percent Paced: 98 %
Brady Statistic RV Percent Paced: 0 %
Date Time Interrogation Session: 20220630010100
HighPow Impedance: 62 Ohm
Implantable Lead Implant Date: 20180703
Implantable Lead Implant Date: 20180703
Implantable Lead Location: 753859
Implantable Lead Location: 753860
Implantable Lead Model: 293
Implantable Lead Model: 3830
Implantable Lead Serial Number: 433180
Implantable Pulse Generator Implant Date: 20180703
Lead Channel Impedance Value: 402 Ohm
Lead Channel Impedance Value: 751 Ohm
Lead Channel Setting Pacing Amplitude: 2.5 V
Lead Channel Setting Pacing Amplitude: 2.5 V
Lead Channel Setting Pacing Pulse Width: 0.4 ms
Lead Channel Setting Sensing Sensitivity: 0.5 mV
Pulse Gen Serial Number: 533886

## 2021-04-17 ENCOUNTER — Other Ambulatory Visit: Payer: Self-pay | Admitting: Family Medicine

## 2021-04-17 DIAGNOSIS — I482 Chronic atrial fibrillation, unspecified: Secondary | ICD-10-CM

## 2021-04-21 NOTE — Progress Notes (Signed)
Remote ICD transmission.   

## 2021-04-24 ENCOUNTER — Ambulatory Visit (HOSPITAL_COMMUNITY)
Admission: RE | Admit: 2021-04-24 | Discharge: 2021-04-24 | Disposition: A | Discharge status: Home / Self Care | Payer: Medicare Other | Source: Ambulatory Visit | Attending: Vascular Surgery | Admitting: Vascular Surgery

## 2021-04-24 ENCOUNTER — Other Ambulatory Visit: Payer: Self-pay

## 2021-04-24 DIAGNOSIS — I6523 Occlusion and stenosis of bilateral carotid arteries: Secondary | ICD-10-CM | POA: Insufficient documentation

## 2021-04-27 DIAGNOSIS — J449 Chronic obstructive pulmonary disease, unspecified: Secondary | ICD-10-CM | POA: Diagnosis not present

## 2021-04-27 NOTE — Progress Notes (Signed)
[facility]  Patient name: [name] [name] MRN: [id number] DOB: [birth year] Sex: male  REASON FOR VISIT: Continued discussion of bilateral carotid disease  HPI: [name] [name] is a 80 y.o. male here today for continued discussion of bilateral carotid disease.  He has been followed by some time in our office for asymptomatic disease.  He recently was seen with repeat duplex suggesting 50 to 79% stenoses bilaterally.  He had described visual changes that were potentially related to retinal embolus and therefore underwent CT scan for further evaluation.  On my discussion with him and his wife today he denies any visual loss.  He simply reports some episodes of redness and irritation in his right eye.  He specifically denies any prior focal weakness or aphasia.  04/28/21: patient previously seen by Dr. Donnetta Hutching.  CTA was performed in July 2021 for consideration of carotid revascularization.  Unfortunately the patient's disease extended very high in the neck and Dr. Donnetta Hutching was concerned that a conventional carotid endarterectomy would be very high risk for cranial nerve injury.  The patient returns for surveillance.  He has had no neurologic symptoms.  Carotid duplex done today shows progression of disease bilaterally.  We discussed options including TCAR for greater than 15 minutes.  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    COPD (chronic obstructive pulmonary disease) (HCC)    on home oxygen, 2 liters at night10/2015   Esophageal reflux    Gallstones    Gastric antral vascular ectasia 2013   Gout    Heme positive stool    Hepatomegaly     Hiatal hernia    History of blood transfusion ~ 2012   "blood count dropped; had to get 3 units"   Hyperlipidemia    Hypothyroidism    Other and unspecified coagulation defects    Personal history of colonic polyps 05/29/2010   TUBULAR ADENOMA   Unspecified essential hypertension     Family History  Problem Relation Age of Onset   Leukemia Mother    Kidney disease Mother        kidney removed    Heart attack Brother    Heart disease Brother    Heart disease Father    Stomach cancer Sister    Stomach cancer Sister    Early death Brother    Hypertension Other        family    Colon cancer Neg Hx     SOCIAL HISTORY: Social History   Tobacco Use   Smoking status: Former    Packs/day: 1.00    Years: 42.00    Pack years: 42.00    Types: Cigarettes    Quit date: 12/16/1996    Years since quitting: 24.3   Smokeless tobacco: Never  Substance Use Topics   Alcohol use: Yes    Alcohol/week: 1.0 standard drink    Types: 1 Cans of beer per week    Comment: 1 per month    No Known Allergies  Current Outpatient Medications  Medication Sig Dispense Refill   albuterol (VENTOLIN HFA) 108 (90 Base) MCG/ACT inhaler USE 2 INHALATIONS BY  MOUTH  EVERY 6 HOURS AS NEEDED FOR SHORTNESS OF BREATH 34 g 0   budesonide (PULMICORT) 0.25 MG/2ML nebulizer solution USE 1 VIAL  IN  NEBULIZER TWICE  DAILY - rinse mouth after treatment 6 mL 5   carvedilol (COREG) 6.25 MG tablet Take 1.5 tablets (9.375 mg total) by mouth 2 (two) times daily. 270 tablet 0   Cholecalciferol (VITAMIN D3) 5000 UNITS CAPS Take 5,000 Units by mouth daily.     docusate sodium (COLACE) 100 MG capsule Take 100 mg by mouth daily.     empagliflozin (JARDIANCE) 10 MG TABS tablet Take 1 tablet (10 mg total) by mouth daily before breakfast. 30 tablet 11   ferrous sulfate 325 (65 FE) MG tablet Take 325 mg by mouth 2 (two) times daily.     fluticasone furoate-vilanterol (BREO ELLIPTA) 200-25 MCG/INH AEPB Inhale 1 puff into the  lungs daily. 28 each 2   hydrALAZINE (APRESOLINE) 50 MG tablet TAKE 1 TABLET BY MOUTH 3  TIMES DAILY 270 tablet 3   Hypromell-Glycerin-Naphazoline 0.8-0.25-0.012 % SOLN Place 1-2 drops into both eyes 3 (three) times daily as needed (for dry eyes.).     ipratropium-albuterol (DUONEB) 0.5-2.5 (3) MG/3ML SOLN USE 1 VIAL IN NEBULIZER 4 TIMES DAILY 9 mL 5   levothyroxine (SYNTHROID) 112 MCG tablet TAKE 1 TABLET BY MOUTH  DAILY BEFORE BREAKFAST 90 tablet 3   loratadine (CLARITIN) 10 MG tablet Take 10 mg by mouth daily as needed (for allergies.).      lovastatin (MEVACOR) 40 MG tablet Take 2 tablets (80 mg total) by mouth at bedtime. 180 tablet 3   Multiple Vitamins-Minerals (MULTIVITAMINS THER. W/MINERALS) TABS Take 1 tablet by mouth daily.     nitroGLYCERIN (NITROSTAT) 0.4 MG SL tablet Place 1 tablet (0.4 mg total) under the tongue every 5 (five) minutes as needed for chest pain. 6 tablet 1   pantoprazole (PROTONIX) 40 MG tablet Take 1 tablet (40 mg total) by mouth 2 (two) times daily. 180 tablet 2   sacubitril-valsartan (ENTRESTO) 97-103 MG Take 1 tablet by mouth 2 (two) times daily. 180 tablet 3   spironolactone (ALDACTONE) 25 MG tablet Take 0.5 tablets (12.5 mg total) by mouth daily. 30 tablet 3   sucralfate (CARAFATE) 1 g tablet TAKE 1 TABLET BY MOUTH 4  TIMES DAILY 1 HOUR BEFORE  MEALS AND AT BEDTIME 360 tablet 1   warfarin (COUMADIN) 5 MG tablet TAKE 1 TABLET BY MOUTH  EVERY MONDAY, WEDNESDAY,  AND FRIDAY AND ONE-HALF  TABLET BY MOUTH ON THE  REMAINING DAYS OF THE WEEK 65 tablet 0   No current facility-administered medications for this visit.    REVIEW OF SYSTEMS:  [X]  denotes positive finding, [ ]  denotes negative finding Cardiac  Comments:  Chest pain or chest pressure:    Shortness of breath upon exertion:    Short of breath when lying flat:    Irregular heart rhythm:        Vascular    Pain in calf, thigh, or hip brought on by ambulation:    Pain in feet at night that wakes you up  from your sleep:     Blood clot in your veins:    Leg swelling:           PHYSICAL EXAM: Vitals:   04/28/21 0953  BP: (!) 144/60  Pulse: 60  Resp: 20  Temp: 98.2 F (36.8 C)  SpO2: 98%  Weight: 169 lb (76.7 kg)  Height: 5\' 10"  (1.778 m)  GENERAL: The patient is a well-nourished male, in no acute distress. The vital signs are documented above. CARDIOVASCULAR: Carotid arteries without bruits bilaterally PULMONARY: There is good air exchange  MUSCULOSKELETAL: There are no major deformities or cyanosis. NEUROLOGIC: No focal weakness or paresthesias are detected. SKIN: There are no ulcers or rashes noted. PSYCHIATRIC: The patient has a normal affect.  DATA:  I reviewed his CT angiogram from 05/01/2020 also reviewed his duplex imaging from June and July of 2021.  CT scan shows extreme calcification at his right bifurcation with interpretation by the radiologist of at least 75% stenosis.  He does have extreme tortuosity of his internal carotid above the bifurcation and has significant calcified plaque with moderate narrowing at the skull base.  On the left he has a 60% stenosis also calcified and severe tortuosity distal in the internal carotid but no stenosis or calcification distally.  Carotid duplex 04/24/2021 shows progression of disease with critical stenosis in bilateral carotid arteries (greater than 80%).  ASSESSMENT / PLAN:  [name] [name] is a 80 y.o. 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   The patient's carotid artery stenosis merits consideration of revascularization to reduce the risk of future stroke because of asymptomatic carotid stenosis >80% in a physiologically fit patient. I quoted the patient risk reduction from intervention of ~6% for asymptomatic stenosis based on data from the ACST/ACAS trials.  The patient is a good candidate for TCAR.  His lesions are quite high and complex making open exposure high risk for cranial nerve  injury.  His CT angiogram as his duplex suggests critical stenoses bilaterally.  I will plan to intervene on the most severe lesion first.  We will ask his cardiologist to read stratify him for TCAR.  The patient should continue best medical therapy for carotid artery stenosis including: Complete cessation from all tobacco products. Blood glucose control with goal A1c < 7%. Blood pressure control with goal blood pressure < 140/90 mmHg. Lipid reduction therapy with goal LDL-C <100 mg/dL (<70 if symptomatic from carotid artery stenosis).  Aspirin 81mg  PO QD.  Atorvastatin 40-80mg  PO QD (or other "high intensity" statin therapy).  If cardiac risk is not prohibitive and his lesion is amenable, we we will plan to proceed with TCAR in the near future.  He will need to start dual antiplatelet therapy with aspirin and Plavix and continue this for 1 month post procedure at a minimum.   Yevonne Aline. Stanford Breed, MD [facility] Phone Number: [phone] 04/28/2021 12:28 PM

## 2021-04-28 ENCOUNTER — Telehealth: Payer: Self-pay

## 2021-04-28 ENCOUNTER — Encounter: Payer: Self-pay | Admitting: Vascular Surgery

## 2021-04-28 ENCOUNTER — Other Ambulatory Visit: Payer: Self-pay

## 2021-04-28 ENCOUNTER — Ambulatory Visit: Payer: Medicare Other | Admitting: Vascular Surgery

## 2021-04-28 VITALS — BP 144/60 | HR 60 | Temp 98.2°F | Resp 20 | Ht 70.0 in | Wt 169.0 lb

## 2021-04-28 DIAGNOSIS — I6523 Occlusion and stenosis of bilateral carotid arteries: Secondary | ICD-10-CM

## 2021-04-28 NOTE — Telephone Encounter (Signed)
   [facility] Pre-operative Risk Assessment    Patient Name: [name] [name]  DOB: 12-12-1940 MRN: [id number]  HEARTCARE STAFF:  - IMPORTANT!!!!!! Under Visit Info/Reason for Call, type in Other and utilize the format Clearance MM/DD/YY or Clearance TBD. Do not use dashes or single digits. - Please review there is not already an duplicate clearance open for this procedure. - If request is for dental extraction, please clarify the # of teeth to be extracted. - If the patient is currently at the dentist's office, call Pre-Op Callback Staff (MA/nurse) to input urgent request.  - If the patient is not currently in the dentist office, please route to the Pre-Op pool.  Request for surgical clearance:  What type of surgery is being performed? Transcarotid Artery Revascularization   When is this surgery scheduled? TBD  What type of clearance is required (medical clearance vs. Pharmacy clearance to hold med vs. Both)? Clearance   Are there any medications that need to be held prior to surgery and how long? N/A  Practice name and name of physician performing surgery? [facility] - Dr.Thomas Hawken   What is the office phone number? [phone]   7.   What is the office fax number? [phone]  8.   Anesthesia type (None, local, MAC, general) ? Unknown    Ena Dawley 04/28/2021, 1:19 PM  _________________________________________________________________   (provider comments below)

## 2021-04-29 ENCOUNTER — Other Ambulatory Visit: Payer: Self-pay

## 2021-04-29 DIAGNOSIS — I6523 Occlusion and stenosis of bilateral carotid arteries: Secondary | ICD-10-CM

## 2021-04-29 NOTE — Telephone Encounter (Signed)
Patient with diagnosis of afib on warfarin for anticoagulation.    Procedure: Transcarotid Artery Revascularization  Date of procedure: TBD  CHA2DS2-VASc Score = 5  This indicates a 7.2% annual risk of stroke. The patient's score is based upon: CHF History: Yes HTN History: Yes Diabetes History: No Stroke History: No Vascular Disease History: Yes Age Score: 2 Gender Score: 0   CrCl 39mL/min Platelet count 231K  Per office protocol, patient can hold warfarin for 5 days prior to procedure. Please forward to PCP as an FYI as they manage pt's warfarin.

## 2021-04-30 DIAGNOSIS — J449 Chronic obstructive pulmonary disease, unspecified: Secondary | ICD-10-CM | POA: Diagnosis not present

## 2021-04-30 NOTE — Telephone Encounter (Signed)
   Primary Cardiologist: None  Chart reviewed as part of pre-operative protocol coverage. Given past medical history and time since last visit, based on ACC/AHA guidelines, [facility] would be at acceptable risk for the planned procedure without further cardiovascular testing.   Patient's PCP manages his warfarin.  PCP office will need to provide recommendations on holding warfarin prior to trancarotid artery revascularization.  I will route this recommendation to the requesting party via Epic fax function and remove from pre-op pool.  Please call with questions.  Jossie Ng. Rommel Hogston NP-C    04/30/2021, 8:24 AM [place] [address] Office [phone] Fax [phone]

## 2021-05-06 ENCOUNTER — Ambulatory Visit (HOSPITAL_COMMUNITY): Payer: Medicare Other

## 2021-05-11 ENCOUNTER — Ambulatory Visit: Payer: Medicare Other | Admitting: Family Medicine

## 2021-05-12 ENCOUNTER — Other Ambulatory Visit: Payer: Self-pay

## 2021-05-12 ENCOUNTER — Encounter (HOSPITAL_COMMUNITY): Payer: Self-pay

## 2021-05-12 ENCOUNTER — Ambulatory Visit: Payer: Medicare Other | Admitting: Vascular Surgery

## 2021-05-12 ENCOUNTER — Ambulatory Visit (HOSPITAL_COMMUNITY)
Admission: RE | Admit: 2021-05-12 | Discharge: 2021-05-12 | Disposition: A | Discharge status: Home / Self Care | Payer: Medicare Other | Source: Ambulatory Visit | Attending: Vascular Surgery | Admitting: Vascular Surgery

## 2021-05-12 DIAGNOSIS — I6523 Occlusion and stenosis of bilateral carotid arteries: Secondary | ICD-10-CM | POA: Insufficient documentation

## 2021-05-12 LAB — POCT I-STAT CREATININE: Creatinine, Ser: 2.3 mg/dL — ABNORMAL HIGH (ref 0.61–1.24)

## 2021-05-12 MED ORDER — IOHEXOL 350 MG/ML SOLN: 75.0000 mL | Freq: Once | INTRAVENOUS | Status: DC | PRN

## 2021-05-13 ENCOUNTER — Encounter: Payer: Self-pay | Admitting: Nurse Practitioner

## 2021-05-13 ENCOUNTER — Encounter (HOSPITAL_COMMUNITY): Payer: Self-pay

## 2021-05-13 ENCOUNTER — Ambulatory Visit (INDEPENDENT_AMBULATORY_CARE_PROVIDER_SITE_OTHER): Payer: Medicare Other | Admitting: Nurse Practitioner

## 2021-05-13 ENCOUNTER — Emergency Department (HOSPITAL_COMMUNITY): Payer: Medicare Other

## 2021-05-13 ENCOUNTER — Emergency Department (HOSPITAL_COMMUNITY)
Admission: EM | Admit: 2021-05-13 | Discharge: 2021-05-13 | Disposition: A | Discharge status: Home / Self Care | Payer: Medicare Other | Attending: Emergency Medicine | Admitting: Emergency Medicine

## 2021-05-13 ENCOUNTER — Telehealth: Payer: Self-pay | Admitting: Family Medicine

## 2021-05-13 DIAGNOSIS — Z7951 Long term (current) use of inhaled steroids: Secondary | ICD-10-CM | POA: Insufficient documentation

## 2021-05-13 DIAGNOSIS — J449 Chronic obstructive pulmonary disease, unspecified: Secondary | ICD-10-CM | POA: Insufficient documentation

## 2021-05-13 DIAGNOSIS — Z951 Presence of aortocoronary bypass graft: Secondary | ICD-10-CM | POA: Diagnosis not present

## 2021-05-13 DIAGNOSIS — I482 Chronic atrial fibrillation, unspecified: Secondary | ICD-10-CM | POA: Insufficient documentation

## 2021-05-13 DIAGNOSIS — Z87891 Personal history of nicotine dependence: Secondary | ICD-10-CM | POA: Diagnosis not present

## 2021-05-13 DIAGNOSIS — I2510 Atherosclerotic heart disease of native coronary artery without angina pectoris: Secondary | ICD-10-CM | POA: Insufficient documentation

## 2021-05-13 DIAGNOSIS — N3001 Acute cystitis with hematuria: Secondary | ICD-10-CM | POA: Insufficient documentation

## 2021-05-13 DIAGNOSIS — E039 Hypothyroidism, unspecified: Secondary | ICD-10-CM | POA: Insufficient documentation

## 2021-05-13 DIAGNOSIS — N183 Chronic kidney disease, stage 3 unspecified: Secondary | ICD-10-CM | POA: Diagnosis not present

## 2021-05-13 DIAGNOSIS — R319 Hematuria, unspecified: Secondary | ICD-10-CM

## 2021-05-13 DIAGNOSIS — I5022 Chronic systolic (congestive) heart failure: Secondary | ICD-10-CM | POA: Diagnosis not present

## 2021-05-13 DIAGNOSIS — M549 Dorsalgia, unspecified: Secondary | ICD-10-CM | POA: Diagnosis not present

## 2021-05-13 DIAGNOSIS — K5730 Diverticulosis of large intestine without perforation or abscess without bleeding: Secondary | ICD-10-CM | POA: Diagnosis not present

## 2021-05-13 DIAGNOSIS — N261 Atrophy of kidney (terminal): Secondary | ICD-10-CM | POA: Diagnosis not present

## 2021-05-13 DIAGNOSIS — I130 Hypertensive heart and chronic kidney disease with heart failure and stage 1 through stage 4 chronic kidney disease, or unspecified chronic kidney disease: Secondary | ICD-10-CM | POA: Diagnosis not present

## 2021-05-13 DIAGNOSIS — Z7901 Long term (current) use of anticoagulants: Secondary | ICD-10-CM | POA: Diagnosis not present

## 2021-05-13 DIAGNOSIS — B9689 Other specified bacterial agents as the cause of diseases classified elsewhere: Secondary | ICD-10-CM | POA: Diagnosis not present

## 2021-05-13 DIAGNOSIS — N1330 Unspecified hydronephrosis: Secondary | ICD-10-CM | POA: Diagnosis not present

## 2021-05-13 DIAGNOSIS — N390 Urinary tract infection, site not specified: Secondary | ICD-10-CM

## 2021-05-13 DIAGNOSIS — Z79899 Other long term (current) drug therapy: Secondary | ICD-10-CM | POA: Insufficient documentation

## 2021-05-13 LAB — CBC WITH DIFFERENTIAL/PLATELET
Abs Immature Granulocytes: 0.03 10*3/uL (ref 0.00–0.07)
Basophils Absolute: 0.1 10*3/uL (ref 0.0–0.1)
Basophils Relative: 1 %
Eosinophils Absolute: 0.2 10*3/uL (ref 0.0–0.5)
Eosinophils Relative: 2 %
HCT: 28.3 % — ABNORMAL LOW (ref 39.0–52.0)
Hemoglobin: 9.1 g/dL — ABNORMAL LOW (ref 13.0–17.0)
Immature Granulocytes: 0 %
Lymphocytes Relative: 10 %
Lymphs Abs: 0.9 10*3/uL (ref 0.7–4.0)
MCH: 32.3 pg (ref 26.0–34.0)
MCHC: 32.2 g/dL (ref 30.0–36.0)
MCV: 100.4 fL — ABNORMAL HIGH (ref 80.0–100.0)
Monocytes Absolute: 1.1 10*3/uL — ABNORMAL HIGH (ref 0.1–1.0)
Monocytes Relative: 12 %
Neutro Abs: 7 10*3/uL (ref 1.7–7.7)
Neutrophils Relative %: 75 %
Platelets: 258 10*3/uL (ref 150–400)
RBC: 2.82 MIL/uL — ABNORMAL LOW (ref 4.22–5.81)
RDW: 13.4 % (ref 11.5–15.5)
WBC: 9.3 10*3/uL (ref 4.0–10.5)
nRBC: 0 % (ref 0.0–0.2)

## 2021-05-13 LAB — URINALYSIS, ROUTINE W REFLEX MICROSCOPIC
RBC / HPF: >50 RBC/hpf — ABNORMAL HIGH (ref 0–5)
WBC, UA: >50 WBC/hpf — ABNORMAL HIGH (ref 0–5)

## 2021-05-13 LAB — PROTIME-INR
INR: 3.2 — ABNORMAL HIGH (ref 0.8–1.2)
Prothrombin Time: 33 seconds — ABNORMAL HIGH (ref 11.4–15.2)

## 2021-05-13 LAB — COMPREHENSIVE METABOLIC PANEL
ALT: 10 U/L (ref 0–44)
AST: 16 U/L (ref 15–41)
Albumin: 3.7 g/dL (ref 3.5–5.0)
Alkaline Phosphatase: 44 U/L (ref 38–126)
Anion gap: 9 (ref 5–15)
BUN: 33 mg/dL — ABNORMAL HIGH (ref 8–23)
CO2: 26 mmol/L (ref 22–32)
Calcium: 8.9 mg/dL (ref 8.9–10.3)
Chloride: 104 mmol/L (ref 98–111)
Creatinine, Ser: 2.52 mg/dL — ABNORMAL HIGH (ref 0.61–1.24)
GFR, Estimated: 25 mL/min — ABNORMAL LOW (ref >60)
Glucose, Bld: 117 mg/dL — ABNORMAL HIGH (ref 70–99)
Potassium: 4.7 mmol/L (ref 3.5–5.1)
Sodium: 139 mmol/L (ref 135–145)
Total Bilirubin: 0.6 mg/dL (ref 0.3–1.2)
Total Protein: 6.4 g/dL — ABNORMAL LOW (ref 6.5–8.1)

## 2021-05-13 LAB — LIPASE, BLOOD: Lipase: 25 U/L (ref 11–51)

## 2021-05-13 MED ORDER — SODIUM CHLORIDE 0.9 % IV BOLUS: 500.0000 mL | Freq: Once | INTRAVENOUS | Status: DC

## 2021-05-13 MED ORDER — HYDROCODONE-ACETAMINOPHEN 5-325 MG PO TABS: 2.0000 | ORAL_TABLET | ORAL | 0 refills | Status: DC | PRN

## 2021-05-13 MED ORDER — FENTANYL CITRATE (PF) 100 MCG/2ML IJ SOLN
50.0000 ug | Freq: Once | INTRAMUSCULAR | Status: AC
  Administered 2021-05-13: 50 ug via INTRAVENOUS
  Filled 2021-05-13: qty 2

## 2021-05-13 MED ORDER — CEPHALEXIN 500 MG PO CAPS: 500.0000 mg | ORAL_CAPSULE | Freq: Four times a day (QID) | ORAL | 0 refills | Status: DC

## 2021-05-13 MED ORDER — HYDROCODONE-ACETAMINOPHEN 5-325 MG PO TABS
1.0000 | ORAL_TABLET | Freq: Once | ORAL | Status: AC
  Administered 2021-05-13: 1 via ORAL
  Filled 2021-05-13: qty 1

## 2021-05-13 MED ORDER — FENTANYL CITRATE (PF) 100 MCG/2ML IJ SOLN: 50.0000 ug | Freq: Once | INTRAMUSCULAR | Status: DC

## 2021-05-13 MED ORDER — SODIUM CHLORIDE 0.9 % IV SOLN
1.0000 g | Freq: Once | INTRAVENOUS | Status: AC
  Administered 2021-05-13: 1 g via INTRAVENOUS
  Filled 2021-05-13: qty 10

## 2021-05-13 NOTE — ED Notes (Signed)
Pt ambulatory to and from restroom without any assistance. Pt complains of not being able to have a "full" bowel movement.

## 2021-05-13 NOTE — Assessment & Plan Note (Signed)
Patient notified to go to the next emergency department for treatment-gross hematuria.  Patient verbalized understanding.  Follow-up with worsening unresolved symptoms.

## 2021-05-13 NOTE — Discharge Instructions (Addendum)
Call the urologist, Dr. [name] Poet, in the morning, for an appointment as soon as possible.  Start the prescriptions, tomorrow morning.  They were sent to your pharmacy.  Do not drive tonight because you have had narcotic pain medications.

## 2021-05-13 NOTE — ED Notes (Signed)
Pt taken to CT.

## 2021-05-13 NOTE — ED Provider Notes (Signed)
Emergency Medicine Provider Triage Evaluation Note  [name] [name] , a 80 y.o. male  was evaluated in triage.  Pt complains of 1 day of hematuria.  Due to significant amount of blood when he urinates, he was seen earlier today at an office visit was advised to come to the ED for further evaluation.  He is not having any fevers, chills, nausea, vomiting, dysuria, abdominal pain.  Review of Systems  Positive: Hematuria Negative:  fevers, chills, nausea, vomiting, dysuria, abdominal pain.  Physical Exam  BP (!) 129/50 (BP Location: Left Arm)   Pulse (!) 55   Resp 16   Ht 5\' 10"  (1.778 m)   Wt 76 kg   SpO2 96%   BMI 24.04 kg/m  Gen:   Awake, no distress   Resp:  Normal effort  MSK:   Moves extremities without difficulty  Other:  No CVA tenderness  Medical Decision Making  Medically screening exam initiated at 1:09 PM.  Appropriate orders placed.  [name] [name] was informed that the remainder of the evaluation will be completed by another provider, this initial triage assessment does not replace that evaluation, and the importance of remaining in the ED until their evaluation is complete.     [name] Raring, PA-C 05/13/21 1310    [name] Dusky, MD 05/13/21 [phone]

## 2021-05-13 NOTE — Progress Notes (Signed)
   Virtual Visit  Note Due to COVID-19 pandemic this visit was conducted virtually. This visit type was conducted due to national recommendations for restrictions regarding the COVID-19 Pandemic (e.g. social distancing, sheltering in place) in an effort to limit this patient's exposure and mitigate transmission in our community. All issues noted in this document were discussed and addressed.  A physical exam was not performed with this format.  I connected with [name] [name] on 05/13/21 at 9:50 AM by telephone and verified that I am speaking with the correct person using two identifiers. [name] [name] is currently located at home during visit. The provider, Ivy Lynn, NP is located in their office at time of visit.  I discussed the limitations, risks, security and privacy concerns of performing an evaluation and management service by telephone and the availability of in person appointments. I also discussed with the patient that there may be a patient responsible charge related to this service. The patient expressed understanding and agreed to proceed.   History and Present Illness:  HPI Worsening gross hematuria in the last 24 hours.  No other signs or symptoms of infection, or dysuria.  Patient is scheduled to see neurologist in the CT scan on Tuesday.  Based on patient's symptoms he is unable to return to work then.   ROS   Observations/Objective: Televisit patient not in distress.  Assessment and Plan: Patient having gross hematuria.  Patient also on warfarin.  Reporting flank pain no other signs and symptoms of dysuria.  Advised patient to bring urinalysis to clinic, urine coagulated in few minutes.  Unable to obtain urinalysis and culture.  Patient has no symptoms urologist in 5 years.  At this time patient seems to emergency department as a symptoms option to treat gross hematuria.  Follow Up Instructions: Follow-up with worsening unresolved symptoms.  Patient sent to the  emergency department.    I discussed the assessment and treatment plan with the patient. The patient was provided an opportunity to ask questions and all were answered. The patient agreed with the plan and demonstrated an understanding of the instructions.   The patient was advised to call back or seek an in-person evaluation if the symptoms worsen or if the condition fails to improve as anticipated.  The above assessment and management plan was discussed with the patient. The patient verbalized understanding of and has agreed to the management plan. Patient is aware to call the clinic if symptoms persist or worsen. Patient is aware when to return to the clinic for a follow-up visit. Patient educated on when it is appropriate to go to the emergency department.   Time call ended: 10 AM  I provided 10 minutes of  non face-to-face time during this encounter.    Ivy Lynn, NP

## 2021-05-13 NOTE — ED Provider Notes (Signed)
[facility] DEPT Provider Note   CSN: [id number] Arrival date & time: 05/13/21  1238     History Chief Complaint  Patient presents with   Hematuria    Langley R Madero is a 80 y.o. male.  HPI He presents for evaluation of hematuria which started yesterday and has occurred several times.  He has had left flank pain the last few days.  Yesterday he was scheduled for CT angiogram of the neck but was unable to have that because his creatinine was elevated.  He has not had fever, vomiting, dizziness or weakness.  He is here with his wife who helps to give history.  He states he has a sensation of having to have a bowel movement.  He takes iron, his stools are chronically dark in color.  There are no other known active modifying factors.     Past Medical History:  Diagnosis Date   AICD (automatic cardioverter/defibrillator) present 04/05/2017   Anemia    Arthritis    "hips; back" (07/09/2014)   Asthma    Atrial fibrillation (HCC)    CAD (coronary artery disease)    CHF (congestive heart failure) ([place])    Cholecystitis 11/2013   CKD (chronic kidney disease) 2015   Stage  3.    COPD (chronic obstructive pulmonary disease) (HCC)    on home oxygen, 2 liters at night10/2015   Esophageal reflux    Gallstones    Gastric antral vascular ectasia 2013   Gout    Heme positive stool    Hepatomegaly    Hiatal hernia    History of blood transfusion ~ 2012   "blood count dropped; had to get 3 units"   Hyperlipidemia    Hypothyroidism    Other and unspecified coagulation defects    Personal history of colonic polyps 05/29/2010   TUBULAR ADENOMA   Unspecified essential hypertension     Patient Active Problem List   Diagnosis Date Noted   Hematuria of unknown cause 05/13/2021   ICD (implantable cardioverter-defibrillator) in place 05/04/2019   NSVT (nonsustained ventricular tachycardia) ([place]) 05/04/2019   Hypothyroidism 01/16/2018   Bradycardia 03/18/2017    GI bleed 03/22/2016   Dyspnea 03/22/2016   GAVE (gastric antral vascular ectasia)    CKD (chronic kidney disease), stage III ([place]) 01/06/2015   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[place]) 07/09/2014   Coronary atherosclerosis of native coronary artery 06/04/2014   Atrial fibrillation, chronic ([place]) 01/16/2013   Gout 12/16/2012   Hyperlipemia 12/16/2012   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   Anemia 74/09/8785   Chronic systolic CHF (congestive heart failure) ([place]) 11/26/2011   Chronic anticoagulation 11/26/2011   Hx of gastroesophageal reflux (GERD) 11/26/2011   Obesity 11/26/2011   Asthma with COPD ([place]) 11/26/2011   Iron deficiency anemia 05/13/2010   HEPATOMEGALY 05/13/2010   Essential hypertension 02/03/2008    Past Surgical History:  Procedure Laterality Date   CARDIAC CATHETERIZATION N/A 07/15/2016   Procedure: Right/Left Heart Cath and Coronary/Graft Angiography;  Surgeon: Belva Crome, MD;  Location: [facility];  Service: Cardiovascular;  Laterality: N/A;   cholecystomy tube  12/28/2014 - 01/06/2015   tube clogged with debris, removed and IR unable to place new tube.    COLONOSCOPY  2013   Dr. Sharlett Iles: no polyps or evidence of active bleeding   CORONARY ANGIOPLASTY WITH STENT PLACEMENT  ~ 2008; 07/08/2014   "1 + 3"   [place]   CABG X4  ESOPHAGOGASTRODUODENOSCOPY  2013   Dr. 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 Sharlett Iles: AVM at 17 and blood at 30 minutes beyond first duodenal image but not actual lesion seen. If persistent IDA, bleeding, recommend enteroscopy with ablation    HERNIA REPAIR     ICD IMPLANT N/A 04/05/2017   Procedure: ICD Implant;  Surgeon:  Evans Lance, MD;  Location: [facility];  Service: Cardiovascular;  Laterality: N/A;   LEFT AND RIGHT HEART CATHETERIZATION WITH CORONARY/GRAFT ANGIOGRAM N/A 07/09/2014   Right and left heart cath, bare metal stent to SVG to RCA. Sinclair Grooms, MD;    UMBILICAL HERNIA REPAIR  [phone]       Family History  Problem Relation Age of Onset   Leukemia Mother    Kidney disease Mother        kidney removed    Heart attack Brother    Heart disease Brother    Heart disease Father    Stomach cancer Sister    Stomach cancer Sister    Early death Brother    Hypertension Other        family    Colon cancer Neg Hx     Social History   Tobacco Use   Smoking status: Former    Packs/day: 1.00    Years: 42.00    Pack years: 42.00    Types: Cigarettes    Quit date: 12/16/1996    Years since quitting: 24.4   Smokeless tobacco: Never  Vaping Use   Vaping Use: Never used  Substance Use Topics   Alcohol use: Yes    Alcohol/week: 1.0 standard drink    Types: 1 Cans of beer per week    Comment: 1 per month   Drug use: No    Home Medications Prior to Admission medications   Medication Sig Start Date End Date Taking? Authorizing Provider  acetaminophen (TYLENOL) 500 MG tablet Take 500 mg by mouth every 6 (six) hours as needed for headache or mild pain.   Yes [provider]  albuterol (VENTOLIN HFA) 108 (90 Base) MCG/ACT inhaler USE 2 INHALATIONS BY MOUTH  EVERY 6 HOURS AS NEEDED FOR SHORTNESS OF BREATH Patient taking differently: Inhale 2 puffs into the lungs every 6 (six) hours as needed for shortness of breath. 04/17/21  Yes Stacks, [name], MD  budesonide (PULMICORT) 0.25 MG/2ML nebulizer solution USE 1 VIAL  IN  NEBULIZER TWICE  DAILY - rinse mouth after treatment Patient taking differently: Take 0.25 mg by nebulization See admin instructions. USE 1 VIAL  IN  NEBULIZER TWICE DAILY - rinse mouth after treatment 03/03/21  Yes Stacks, [name], MD  carvedilol (COREG) 6.25  MG tablet Take 1.5 tablets (9.375 mg total) by mouth 2 (two) times daily. 02/16/21  Yes Stacks, [name], MD  cephALEXin (KEFLEX) 500 MG capsule Take 1 capsule (500 mg total) by mouth 4 (four) times daily. 05/13/21  Yes Daleen Bo, MD  Cholecalciferol (VITAMIN D3) 5000 UNITS CAPS Take 5,000 Units by mouth daily.   Yes [provider]  docusate sodium (COLACE) 100 MG capsule Take 100 mg by mouth daily as needed for mild constipation.   Yes [provider]  empagliflozin (JARDIANCE) 10 MG TABS tablet Take 1 tablet (10 mg total) by  mouth daily before breakfast. 11/24/20  Yes Stacks, [name], MD  ferrous sulfate 325 (65 FE) MG tablet Take 325 mg by mouth daily with breakfast.   Yes [provider]  hydrALAZINE (APRESOLINE) 50 MG tablet TAKE 1 TABLET BY MOUTH 3  TIMES DAILY Patient taking differently: Take 50 mg by mouth 3 (three) times daily. 07/21/20  Yes Larey Dresser, MD  HYDROcodone-acetaminophen (NORCO) 5-325 MG tablet Take 2 tablets by mouth every 4 (four) hours as needed. 05/13/21  Yes Daleen Bo, MD  Hypromell-Glycerin-Naphazoline 0.8-0.25-0.012 % SOLN Place 1-2 drops into both eyes 3 (three) times daily as needed (for dry eyes.).   Yes [provider]  ipratropium-albuterol (DUONEB) 0.5-2.5 (3) MG/3ML SOLN USE 1 VIAL IN NEBULIZER 4 TIMES DAILY Patient taking differently: Take 3 mLs by nebulization 4 (four) times daily. 03/03/21  Yes Stacks, [name], MD  levothyroxine (SYNTHROID) 112 MCG tablet TAKE 1 TABLET BY MOUTH  DAILY BEFORE BREAKFAST Patient taking differently: Take 112 mcg by mouth daily before breakfast. 10/17/20  Yes Stacks, [name], MD  loratadine (CLARITIN) 10 MG tablet Take 10 mg by mouth daily as needed (for allergies.).    Yes [provider]  lovastatin (MEVACOR) 40 MG tablet Take 2 tablets (80 mg total) by mouth at bedtime. 09/09/20  Yes Claretta Fraise, MD  Multiple Vitamins-Minerals (MULTIVITAMINS THER. W/MINERALS) TABS Take 1 tablet  by mouth daily.   Yes [provider]  nitroGLYCERIN (NITROSTAT) 0.4 MG SL tablet Place 1 tablet (0.4 mg total) under the tongue every 5 (five) minutes as needed for chest pain. 03/18/17  Yes Shirley Friar, PA-C  OXYGEN Inhale 2 L/min into the lungs See admin instructions. 2 L/min at bedtime and as needed daily as needed for shortness of breath   Yes [provider]  pantoprazole (PROTONIX) 40 MG tablet Take 1 tablet (40 mg total) by mouth 2 (two) times daily. Patient taking differently: Take 40 mg by mouth in the morning and at bedtime. 12/12/20  Yes Stacks, [name], MD  sacubitril-valsartan (ENTRESTO) 97-103 MG Take 1 tablet by mouth 2 (two) times daily. 10/23/20  Yes Larey Dresser, MD  spironolactone (ALDACTONE) 25 MG tablet Take 0.5 tablets (12.5 mg total) by mouth daily. 12/16/20  Yes Larey Dresser, MD  sucralfate (CARAFATE) 1 g tablet TAKE 1 TABLET BY MOUTH 4  TIMES DAILY 1 HOUR BEFORE  MEALS AND AT BEDTIME Patient taking differently: Take 1 g by mouth See admin instructions. Take 1 gram by mouth four times a day- ONE HOUR before meals and at bedtime 10/13/20  Yes Stacks, [name], MD  warfarin (COUMADIN) 5 MG tablet TAKE 1 TABLET BY MOUTH  EVERY MONDAY, WEDNESDAY,  AND FRIDAY AND ONE-HALF  TABLET BY MOUTH ON THE  REMAINING DAYS OF THE WEEK Patient taking differently: Take 2.5-5 mg by mouth See admin instructions. Take 2.5 mg by mouth in the morning on Sun/Tues/Thurs/Sat and 5 mg on Mon/Wed/Fri 04/17/21  Yes Stacks, [name], MD  fluticasone furoate-vilanterol (BREO ELLIPTA) 200-25 MCG/INH AEPB Inhale 1 puff into the lungs daily. Patient not taking: Reported on 05/13/2021 10/13/20   Claretta Fraise, MD    Allergies    Patient has no known allergies.  Review of Systems   Review of Systems  All other systems reviewed and are negative.  Physical Exam Updated Vital Signs BP (!) 133/98   Pulse (!) 59   Temp 98.5 F (36.9 C)   Resp 16   Ht 5\' 10"  (1.778 m)   Wt  76 kg    SpO2 97%   BMI 24.04 kg/m   Physical Exam Vitals and nursing note reviewed.  Constitutional:      Appearance: He is well-developed. He is not ill-appearing.  HENT:     Head: Normocephalic and atraumatic.     Right Ear: External ear normal.     Left Ear: External ear normal.  Eyes:     Conjunctiva/sclera: Conjunctivae normal.     Pupils: Pupils are equal, round, and reactive to light.  Neck:     Trachea: Phonation normal.  Cardiovascular:     Rate and Rhythm: Normal rate and regular rhythm.     Heart sounds: Normal heart sounds.  Pulmonary:     Effort: Pulmonary effort is normal.     Breath sounds: Normal breath sounds.  Abdominal:     Palpations: Abdomen is soft.     Tenderness: There is no abdominal tenderness.  Genitourinary:    Comments: Normal anus.  Small amount of dark stool in the rectum, no rectal mass, no visible bleeding. Musculoskeletal:        General: Normal range of motion.     Cervical back: Normal range of motion and neck supple.  Skin:    General: Skin is warm and dry.  Neurological:     Mental Status: He is alert and oriented to person, place, and time.     Cranial Nerves: No cranial nerve deficit.     Sensory: No sensory deficit.     Motor: No abnormal muscle tone.     Coordination: Coordination normal.  Psychiatric:        Behavior: Behavior normal.        Thought Content: Thought content normal.        Judgment: Judgment normal.    ED Results / Procedures / Treatments   Labs (all labs ordered are listed, but only abnormal results are displayed) Labs Reviewed  CBC WITH DIFFERENTIAL/PLATELET - Abnormal; Notable for the following components:      Result Value   RBC 2.82 (*)    Hemoglobin 9.1 (*)    HCT 28.3 (*)    MCV 100.4 (*)    Monocytes Absolute 1.1 (*)    All other components within normal limits  COMPREHENSIVE METABOLIC PANEL - Abnormal; Notable for the following components:   Glucose, Bld 117 (*)    BUN 33 (*)    Creatinine, Ser  2.52 (*)    Total Protein 6.4 (*)    GFR, Estimated 25 (*)    All other components within normal limits  URINALYSIS, ROUTINE W REFLEX MICROSCOPIC - Abnormal; Notable for the following components:   Color, Urine RED (*)    APPearance TURBID (*)    Glucose, UA   (*)    Value: TEST NOT REPORTED DUE TO COLOR INTERFERENCE OF URINE PIGMENT   Hgb urine dipstick   (*)    Value: TEST NOT REPORTED DUE TO COLOR INTERFERENCE OF URINE PIGMENT   Bilirubin Urine   (*)    Value: TEST NOT REPORTED DUE TO COLOR INTERFERENCE OF URINE PIGMENT   Ketones, ur   (*)    Value: TEST NOT REPORTED DUE TO COLOR INTERFERENCE OF URINE PIGMENT   Protein, ur   (*)    Value: TEST NOT REPORTED DUE TO COLOR INTERFERENCE OF URINE PIGMENT   Nitrite   (*)    Value: TEST NOT REPORTED DUE TO COLOR INTERFERENCE OF URINE PIGMENT   Leukocytes,Ua   (*)  Value: TEST NOT REPORTED DUE TO COLOR INTERFERENCE OF URINE PIGMENT   RBC / HPF >50 (*)    WBC, UA >50 (*)    Bacteria, UA RARE (*)    All other components within normal limits  PROTIME-INR - Abnormal; Notable for the following components:   Prothrombin Time 33.0 (*)    INR 3.2 (*)    All other components within normal limits  URINE CULTURE  LIPASE, BLOOD    EKG None  Radiology CT Renal Stone Study  Result Date: 05/13/2021 CLINICAL DATA:  Hematuria, back pain EXAM: CT ABDOMEN AND PELVIS WITHOUT CONTRAST TECHNIQUE: Multidetector CT imaging of the abdomen and pelvis was performed following the standard protocol without IV contrast. COMPARISON:  01/06/2015 FINDINGS: Lower chest: Small nodule along the left major fissure measures 4 mm. Right lower lobe nodule on the 1st image posteriorly measures 6 mm. Scarring in the lung bases. No effusions. Coronary artery and aortic calcifications. Mild cardiomegaly. Hepatobiliary: No focal hepatic abnormality. Gallbladder unremarkable. Pancreas: No focal abnormality or ductal dilatation. Spleen: No focal abnormality.  Normal size.  Adrenals/Urinary Tract: There is mild right hydronephrosis. High-density material seen within the renal collecting system and proximal ureter, likely blood/hematuria. No visible obstructing stones. The right ureter gradually tapers to normal caliber in the proximal ureter. Bilateral renal vascular calcifications. Adrenal glands and urinary bladder unremarkable. Left kidney is atrophic. Stomach/Bowel: Sigmoid diverticulosis. No active diverticulitis. Stomach and small bowel decompressed, unremarkable. Vascular/Lymphatic: Diffuse aortoiliac atherosclerosis. No evidence of aneurysm or adenopathy. Reproductive: Mildly prominent prostate with central calcifications. Other: No free fluid or free air. Musculoskeletal: No acute bony abnormality. IMPRESSION: Mild right hydronephrosis with gradual tapering of the proximal right ureter. No visible cause of the hydronephrosis. High-density material seen within the right renal collecting system and proximal ureter compatible with hematuria/blood. Atrophic left kidney. Sigmoid diverticulosis.  No active diverticulitis. Bilateral lower lobe pulmonary nodules. Non-contrast chest CT at 3-6 months is recommended. If the nodules are stable at time of repeat CT, then future CT at 18-24 months (from today's scan) is considered optional for low-risk patients, but is recommended for high-risk patients. This recommendation follows the consensus statement: Guidelines for Management of Incidental Pulmonary Nodules Detected on CT Images: From the Fleischner Society 2017; Radiology 2017; 284:228-243. Electronically Signed   By: Rolm Baptise M.D.   On: 05/13/2021 18:30    Procedures .Critical Care  Date/Time: 05/14/2021 11:34 AM Performed by: Daleen Bo, MD Authorized by: Daleen Bo, MD   Critical care provider statement:    Critical care time (minutes):  35   Critical care start time:  05/13/2021 4:02 PM   Critical care end time:  05/13/2021 8:15 AM   Critical care time was  exclusive of:  Separately billable procedures and treating other patients   Critical care was necessary to treat or prevent imminent or life-threatening deterioration of the following conditions:  Renal failure   Critical care was time spent personally by me on the following activities:  Blood draw for specimens, development of treatment plan with patient or surrogate, discussions with consultants, evaluation of patient's response to treatment, examination of patient, obtaining history from patient or surrogate, ordering and performing treatments and interventions, ordering and review of laboratory studies, pulse oximetry, re-evaluation of patient's condition, review of old charts and ordering and review of radiographic studies   Medications Ordered in ED Medications  cefTRIAXone (ROCEPHIN) 1 g in sodium chloride 0.9 % 100 mL IVPB (0 g Intravenous Stopped 05/13/21 1852)  fentaNYL (SUBLIMAZE)  injection 50 mcg (50 mcg Intravenous Given 05/13/21 1820)  HYDROcodone-acetaminophen (NORCO/VICODIN) 5-325 MG per tablet 1 tablet (1 tablet Oral Given 05/13/21 2003)    ED Course  I have reviewed the triage vital signs and the nursing notes.  Pertinent labs & imaging results that were available during my care of the patient were reviewed by me and considered in my medical decision making (see chart for details).  Clinical Course as of 05/14/21 1135  Wed May 13, 2021  1649 Glucose(!): 117 [EW]  1755 CT Renal Laren Everts [EW]    Clinical Course User Index [EW] Daleen Bo, MD   MDM Rules/Calculators/A&P                            Patient Vitals for the past 24 hrs:  BP Temp Temp src Pulse Resp SpO2 Height Weight  05/13/21 2000 (!) 133/98 98.5 F (36.9 C) -- (!) 59 16 97 % -- --  05/13/21 1930 (!) 188/72 -- -- (!) 51 16 96 % -- --  05/13/21 1900 (!) 183/70 -- -- 60 18 96 % -- --  05/13/21 1830 (!) 177/68 -- -- 60 17 96 % -- --  05/13/21 1820 (!) 186/74 -- -- 60 18 98 % -- --  05/13/21 1708 (!)  192/62 -- -- (!) 59 18 98 % -- --  05/13/21 1630 (!) 185/65 -- -- 60 17 98 % -- --  05/13/21 1600 (!) 169/63 -- -- 60 18 98 % -- --  05/13/21 1520 -- -- -- 60 17 99 % -- --  05/13/21 1515 (!) 174/60 98.6 F (37 C) -- (!) 58 18 99 % -- --  05/13/21 1305 -- -- -- -- -- -- 5\' 10"  (1.778 m) 76 kg  05/13/21 1300 (!) 129/50 -- Oral (!) 55 16 96 % -- --    At the time of discharge- reevaluation with update and discussion. After initial assessment and treatment, an updated evaluation reveals he is comfortable and has no further complaints. Daleen Bo   Medical Decision Making:  This patient is presenting for evaluation of left flank pain and hematuria, which does require a range of treatment options, and is a complaint that involves a moderate risk of morbidity and mortality. The differential diagnoses include UTI, obstructing kidney stone, urogenital bleeding. I decided to review old records, and in summary elderly male with history of heart failure, coronary disease, chronic kidney disease, hypertension and anticoagulation for atrial fibrillation presenting with hematuria.  I did not require additional historical information from anyone.  Clinical Laboratory Tests Ordered, included CBC, Metabolic panel, Urinalysis, and lipase, INR . Review indicates normal except glucose high, BUN high, creatinine high, total protein low, INR was therapeutic anticoagulation, urinalysis with greater than 50 red and white cells, bacteria, otherwise uninterpretable because of turbidity. Radiologic Tests Ordered, included CT renal study.  I independently Visualized: Radiographic images, which show no obstructing kidney stone.  Mild right hydronephrosis with debris in the ureter consistent with blood products    Critical Interventions-clinical evaluation, testing, radiography, medication treatment, repeat medication for pain, observation and reassessment.  Discussion with urology  After These Interventions, the  Patient was reevaluated and was found stable for discharge.  Patient with apparent UTI, and mild right hydronephrosis.  No evidence for obstructing stone, or significant pain indicating obstruction from blood.  Patient is nontoxic and pain improved with medication.  Stable for outpatient management.  Plan outpatient urology follow-up.  CRITICAL CARE-yes Performed by: Daleen Bo  Nursing Notes Reviewed/ Care Coordinated Applicable Imaging Reviewed Interpretation of Laboratory Data incorporated into ED treatment  The patient appears reasonably screened and/or stabilized for discharge and I doubt any other medical condition or other [facility] requiring further screening, evaluation, or treatment in the ED at this time prior to discharge.  Plan: Home Medications-continue usual medications; Home Treatments-gradual advance diet and activity; return here if the recommended treatment, does not improve the symptoms; Recommended follow up-urology follow-up 1 week and as needed     Final Clinical Impression(s) / ED Diagnoses Final diagnoses:  Urinary tract infection with hematuria, site unspecified    Rx / DC Orders ED Discharge Orders          Ordered    HYDROcodone-acetaminophen (NORCO) 5-325 MG tablet  Every 4 hours PRN        05/13/21 1953    cephALEXin (KEFLEX) 500 MG capsule  4 times daily        05/13/21 1953             Daleen Bo, MD 05/14/21 1136

## 2021-05-13 NOTE — ED Triage Notes (Signed)
Pt reports hematuria x 1 day with lower back pain and dribbling with urination.

## 2021-05-15 ENCOUNTER — Encounter: Payer: Self-pay | Admitting: Nurse Practitioner

## 2021-05-15 ENCOUNTER — Ambulatory Visit (INDEPENDENT_AMBULATORY_CARE_PROVIDER_SITE_OTHER): Payer: Medicare Other | Admitting: Nurse Practitioner

## 2021-05-15 ENCOUNTER — Other Ambulatory Visit: Payer: Self-pay

## 2021-05-15 VITALS — BP 168/73 | HR 60 | Temp 96.6°F | Resp 20 | Ht 70.0 in | Wt 170.0 lb

## 2021-05-15 DIAGNOSIS — Z7901 Long term (current) use of anticoagulants: Secondary | ICD-10-CM

## 2021-05-15 DIAGNOSIS — R310 Gross hematuria: Secondary | ICD-10-CM

## 2021-05-15 LAB — URINALYSIS, COMPLETE
Specific Gravity, UA: <1.005 — ABNORMAL LOW (ref 1.005–1.030)
pH, UA: >9 — ABNORMAL HIGH (ref 5.0–7.5)

## 2021-05-15 LAB — COAGUCHEK XS/INR WAIVED
INR: 6.9 (ref 0.9–1.1)
Prothrombin Time: 82.9 s

## 2021-05-15 LAB — HEMOGLOBIN, FINGERSTICK: Hemoglobin: 9.7 g/dL — ABNORMAL LOW (ref 12.6–17.7)

## 2021-05-15 MED ORDER — PHYTONADIONE 5 MG PO TABS
2.5000 mg | ORAL_TABLET | Freq: Once | ORAL | Status: AC
  Administered 2021-05-15: 2.5 mg via ORAL

## 2021-05-15 NOTE — Progress Notes (Signed)
   Subjective:    Patient ID: [name] [name], male    DOB: [birth year], 80 y.o.   MRN: [id number]   Chief Complaint: abnormal labs  HPI Patient was at hospital to have CT scan with contrast. They did blood work and his kidney function was not good so they cancelled scan. Then that night he noticed blood in his urine. He went to ED. Had so much blood in his urine that they could not tell if infection or not. They gave him antibiotic and rferred him to his kidney doctor. They checked him for kidney stones but could not see any. They are concerned about all the blood in his urine. They wanted hgb checked since he is losing so much blood in his kidneys. Patient is on coumadin.    Review of Systems  Constitutional:  Positive for fatigue.  Respiratory: Negative.    Cardiovascular: Negative.   Genitourinary:  Positive for hematuria.  Neurological: Negative.   Psychiatric/Behavioral: Negative.    All other systems reviewed and are negative.     Objective:   Physical Exam Vitals and nursing note reviewed.  Constitutional:      Appearance: Normal appearance.  Cardiovascular:     Rate and Rhythm: Normal rate. Rhythm irregular.     Heart sounds: Normal heart sounds.  Pulmonary:     Effort: Pulmonary effort is normal.     Breath sounds: Normal breath sounds.  Skin:    General: Skin is warm.  Neurological:     General: No focal deficit present.     Mental Status: He is alert and oriented to person, place, and time.  Psychiatric:        Mood and Affect: Mood normal.        Behavior: Behavior normal.     BP (!) 168/73   Pulse 60   Temp (!) 96.6 F (35.9 C) (Temporal)   Resp 20   Ht 5\' 10"  (1.778 m)   Wt 170 lb (77.1 kg)   SpO2 95%   BMI 24.39 kg/m  INR 6.9 Hgb 9.7     Assessment & Plan:  [name] [name] in today with chief complaint of No chief complaint on file.   1. Gross hematuria Possibly coming from coumadin - Urinalysis, Complete - Urine Culture - Hemoglobin,  fingerstick  2. Chronic anticoagulation Hold coumadin until seen on Monday Daily iron supplement - CoaguChek XS/INR Waived - phytonadione (VITAMIN K) tablet 2.5 mg  Spoke with Dr. Aundra Dubin via epic and he is ok with letting him switch to eliquis. We will decide on Monday what we want to do and let him know.  The above assessment and management plan was discussed with the patient. The patient verbalized understanding of and has agreed to the management plan. Patient is aware to call the clinic if symptoms persist or worsen. Patient is aware when to return to the clinic for a follow-up visit. Patient educated on when it is appropriate to go to the emergency department.   Mary-Margaret Hassell Done, FNP

## 2021-05-16 LAB — URINE CULTURE: Culture: 60000 — AB

## 2021-05-17 LAB — URINE CULTURE: Organism ID, Bacteria: NO GROWTH

## 2021-05-18 ENCOUNTER — Encounter: Payer: Self-pay | Admitting: Nurse Practitioner

## 2021-05-18 ENCOUNTER — Other Ambulatory Visit: Payer: Self-pay

## 2021-05-18 ENCOUNTER — Ambulatory Visit (INDEPENDENT_AMBULATORY_CARE_PROVIDER_SITE_OTHER): Payer: Medicare Other | Admitting: Nurse Practitioner

## 2021-05-18 ENCOUNTER — Telehealth: Payer: Self-pay

## 2021-05-18 VITALS — BP 148/51 | HR 58 | Temp 96.9°F | Resp 20 | Ht 70.0 in | Wt 174.0 lb

## 2021-05-18 DIAGNOSIS — I482 Chronic atrial fibrillation, unspecified: Secondary | ICD-10-CM

## 2021-05-18 DIAGNOSIS — R310 Gross hematuria: Secondary | ICD-10-CM

## 2021-05-18 DIAGNOSIS — Z7901 Long term (current) use of anticoagulants: Secondary | ICD-10-CM | POA: Diagnosis not present

## 2021-05-18 LAB — URINALYSIS, COMPLETE
Bilirubin, UA: NEGATIVE
Glucose, UA: NEGATIVE
Nitrite, UA: NEGATIVE
Specific Gravity, UA: 1.01 (ref 1.005–1.030)
Urobilinogen, Ur: 0.2 mg/dL (ref 0.2–1.0)
pH, UA: 5.5 (ref 5.0–7.5)

## 2021-05-18 LAB — COAGUCHEK XS/INR WAIVED
INR: 4.7 — ABNORMAL HIGH (ref 0.9–1.1)
Prothrombin Time: 30.8 s

## 2021-05-18 LAB — MICROSCOPIC EXAMINATION
Epithelial Cells (non renal): NONE SEEN /hpf (ref 0–10)
RBC, Urine: >30 /hpf — AB (ref 0–2)
Renal Epithel, UA: NONE SEEN /hpf

## 2021-05-18 LAB — HEMOGLOBIN, FINGERSTICK: Hemoglobin: 9.9 g/dL — ABNORMAL LOW (ref 12.6–17.7)

## 2021-05-18 NOTE — Telephone Encounter (Signed)
Post ED Visit - Positive Culture Follow-up  Culture report reviewed by antimicrobial stewardship pharmacist: [facility] []  Elenor Quinones, Pharm.D. []  Heide Guile, Pharm.D., BCPS AQ-ID []  Parks Neptune, Pharm.D., BCPS []  Alycia Rossetti, Pharm.D., BCPS []  [place], Pharm.D., BCPS, AAHIVP []  Legrand Como, Pharm.D., BCPS, AAHIVP []  Salome Arnt, PharmD, BCPS []  Johnnette Gourd, PharmD, BCPS []  Hughes Better, PharmD, BCPS []  Leeroy Cha, PharmD []  Laqueta Linden, PharmD, BCPS []  Albertina Parr, PharmD  [place] Team [x]  Leodis Sias, PharmD []  Lindell Spar, PharmD []  Royetta Asal, PharmD []  Graylin Shiver, Rph []  Rema Fendt) Glennon Mac, PharmD []  Arlyn Dunning, PharmD []  Netta Cedars, PharmD []  Dia Sitter, PharmD []  Leone Haven, PharmD []  Gretta Arab, PharmD []  Theodis Shove, PharmD []  Peggyann Juba, PharmD []  Reuel Boom, PharmD   Positive urine culture Treated with Cephalexin, organism sensitive to the same and no further patient follow-up is required at this time.  Glennon Hamilton 05/18/2021, 9:17 AM

## 2021-05-18 NOTE — Addendum Note (Signed)
Addended by: Rolena Infante on: 05/18/2021 04:07 PM   Modules accepted: Orders

## 2021-05-18 NOTE — Patient Instructions (Signed)

## 2021-05-18 NOTE — Progress Notes (Signed)
No show

## 2021-05-18 NOTE — Progress Notes (Signed)
   Subjective:    Patient ID: [name] [name], male    DOB: June 19, 1941, 80 y.o.   MRN: [id number]   Chief Complaint: Anticoagulation (Recheck urine/)   HPI Patient comes in t today for INR recheck. He was seen on Friday and had gross hematuria. His INR was found to be 9.7. we stopped his coumadin and gave him 2.5mg  of vitamin K. His urine is still a little pink today. His hgb Friday was 9.7.     Review of Systems  Genitourinary:  Positive for hematuria. Negative for dysuria, flank pain and frequency.  All other systems reviewed and are negative.     Objective:   Physical Exam Vitals and nursing note reviewed.  Constitutional:      Appearance: Normal appearance.  Cardiovascular:     Rate and Rhythm: Regular rhythm.     Heart sounds: Normal heart sounds.  Pulmonary:     Breath sounds: Normal breath sounds.  Skin:    General: Skin is warm.  Neurological:     General: No focal deficit present.     Mental Status: He is alert.  Psychiatric:        Mood and Affect: Mood normal.        Behavior: Behavior normal.    BP (!) 148/51   Pulse (!) 58   Temp (!) 96.9 F (36.1 C) (Temporal)   Resp 20   Ht 5\' 10"  (1.778 m)   Wt 174 lb (78.9 kg)   SpO2 92%   BMI 24.97 kg/m  INR 4.7 HGB 9.9     Assessment & Plan:  [name] [name] in today with chief complaint of Anticoagulation (Recheck urine/)   1. Chronic anticoagulation See inr documentation - CoaguChek XS/INR Waived  2. Gross hematuria Continue to force fluids - Urinalysis, Complete  3. Atrial fibrillation, chronic (HCC) Hold coumadin until seen on Thursday Wife will find out how much eliquis will cost them and we will discuson Thursday.    The above assessment and management plan was discussed with the patient. The patient verbalized understanding of and has agreed to the management plan. Patient is aware to call the clinic if symptoms persist or worsen. Patient is aware when to return to the clinic for a  follow-up visit. Patient educated on when it is appropriate to go to the emergency department.   Mary-Margaret Hassell Done, FNP

## 2021-05-19 ENCOUNTER — Ambulatory Visit (INDEPENDENT_AMBULATORY_CARE_PROVIDER_SITE_OTHER): Payer: Medicare Other | Admitting: Vascular Surgery

## 2021-05-19 DIAGNOSIS — I6523 Occlusion and stenosis of bilateral carotid arteries: Secondary | ICD-10-CM

## 2021-05-20 LAB — URINE CULTURE: Organism ID, Bacteria: NO GROWTH

## 2021-05-21 ENCOUNTER — Ambulatory Visit (INDEPENDENT_AMBULATORY_CARE_PROVIDER_SITE_OTHER): Payer: Medicare Other | Admitting: Nurse Practitioner

## 2021-05-21 ENCOUNTER — Other Ambulatory Visit: Payer: Self-pay

## 2021-05-21 ENCOUNTER — Encounter: Payer: Self-pay | Admitting: Nurse Practitioner

## 2021-05-21 VITALS — BP 177/60 | HR 60 | Temp 96.9°F | Resp 20 | Ht 70.0 in | Wt 170.0 lb

## 2021-05-21 DIAGNOSIS — D619 Aplastic anemia, unspecified: Secondary | ICD-10-CM | POA: Diagnosis not present

## 2021-05-21 DIAGNOSIS — Z7901 Long term (current) use of anticoagulants: Secondary | ICD-10-CM | POA: Diagnosis not present

## 2021-05-21 DIAGNOSIS — R319 Hematuria, unspecified: Secondary | ICD-10-CM

## 2021-05-21 LAB — HEMOGLOBIN, FINGERSTICK: Hemoglobin: 8.4 g/dL — ABNORMAL LOW (ref 12.6–17.7)

## 2021-05-21 LAB — COAGUCHEK XS/INR WAIVED
INR: 2.3 — ABNORMAL HIGH (ref 0.9–1.1)
Prothrombin Time: 27.1 s

## 2021-05-21 NOTE — Progress Notes (Signed)
   Subjective:    Patient ID: [name] [name], male    DOB: Feb 22, 1941, 80 y.o.   MRN: [id number]   Chief Complaint: INR recheck   HPI  Patient was seen several day sago with hematuria and was found to have elevated INR. He was given vitamin K and coumadin dose adjusted. He followed up on Monday Aug 15,2022 and his hematuria had improved and his INR ws 4.7. HGB was holding steady at 9.8. we made further coumadin adjustmemts and he is here to day to have levels rechecked.  Urine culture did not grow any bacteria.   Review of Systems  Constitutional:  Negative for diaphoresis.  Eyes:  Negative for pain.  Respiratory:  Negative for shortness of breath.   Cardiovascular:  Negative for chest pain, palpitations and leg swelling.  Gastrointestinal:  Negative for abdominal pain.  Endocrine: Negative for polydipsia.  Skin:  Negative for rash.  Neurological:  Negative for dizziness, weakness and headaches.  Hematological:  Does not bruise/bleed easily.  All other systems reviewed and are negative.     Objective:   Physical Exam Vitals reviewed.  Constitutional:      Appearance: Normal appearance.  Cardiovascular:     Rate and Rhythm: Normal rate and regular rhythm.     Heart sounds: Normal heart sounds.  Pulmonary:     Breath sounds: Normal breath sounds.  Skin:    General: Skin is warm.  Neurological:     General: No focal deficit present.     Mental Status: He is alert and oriented to person, place, and time.  Psychiatric:        Mood and Affect: Mood normal.        Behavior: Behavior normal.   BP (!) 177/60   Pulse 60   Temp (!) 96.9 F (36.1 C) (Temporal)   Resp 20   Ht 5\' 10"  (1.778 m)   Wt 170 lb (77.1 kg)   SpO2 91%   BMI 24.39 kg/m   Hgb 8.4 INR 2.3       Assessment & Plan:   [name] [name] in today with chief complaint of No chief complaint on file.    1. Chronic anticoagulation INR almost in range See anticoag documentation Needs rechecked in 1  week - CoaguChek XS/INR Waived  2. Hematuria of unknown cause No longer sees blood in urine - Hemoglobin, fingerstick  3. 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 Livia Snellen    The above assessment and management plan was discussed with the patient. The patient verbalized understanding of and has agreed to the management plan. Patient is aware to call the clinic if symptoms persist or worsen. Patient is aware when to return to the clinic for a follow-up visit. Patient educated on when it is appropriate to go to the emergency department.   Mary-Margaret Hassell Done, FNP

## 2021-05-22 ENCOUNTER — Inpatient Hospital Stay (HOSPITAL_COMMUNITY)
Admission: EM | Admit: 2021-05-22 | Discharge: 2021-06-05 | DRG: 674 | Disposition: A | Discharge status: Rehabilitation Facility | Payer: Medicare Other | Attending: Internal Medicine | Admitting: Internal Medicine

## 2021-05-22 DIAGNOSIS — Z7901 Long term (current) use of anticoagulants: Secondary | ICD-10-CM

## 2021-05-22 DIAGNOSIS — D62 Acute posthemorrhagic anemia: Secondary | ICD-10-CM | POA: Diagnosis present

## 2021-05-22 DIAGNOSIS — Z7951 Long term (current) use of inhaled steroids: Secondary | ICD-10-CM

## 2021-05-22 DIAGNOSIS — E875 Hyperkalemia: Secondary | ICD-10-CM | POA: Diagnosis not present

## 2021-05-22 DIAGNOSIS — I4901 Ventricular fibrillation: Secondary | ICD-10-CM | POA: Diagnosis not present

## 2021-05-22 DIAGNOSIS — Z841 Family history of disorders of kidney and ureter: Secondary | ICD-10-CM

## 2021-05-22 DIAGNOSIS — Z79890 Hormone replacement therapy (postmenopausal): Secondary | ICD-10-CM

## 2021-05-22 DIAGNOSIS — D649 Anemia, unspecified: Secondary | ICD-10-CM | POA: Diagnosis not present

## 2021-05-22 DIAGNOSIS — Z9981 Dependence on supplemental oxygen: Secondary | ICD-10-CM

## 2021-05-22 DIAGNOSIS — Z4901 Encounter for fitting and adjustment of extracorporeal dialysis catheter: Secondary | ICD-10-CM | POA: Diagnosis not present

## 2021-05-22 DIAGNOSIS — N189 Chronic kidney disease, unspecified: Secondary | ICD-10-CM | POA: Diagnosis not present

## 2021-05-22 DIAGNOSIS — M160 Bilateral primary osteoarthritis of hip: Secondary | ICD-10-CM | POA: Diagnosis not present

## 2021-05-22 DIAGNOSIS — N281 Cyst of kidney, acquired: Secondary | ICD-10-CM | POA: Diagnosis not present

## 2021-05-22 DIAGNOSIS — N186 End stage renal disease: Secondary | ICD-10-CM | POA: Diagnosis not present

## 2021-05-22 DIAGNOSIS — I132 Hypertensive heart and chronic kidney disease with heart failure and with stage 5 chronic kidney disease, or end stage renal disease: Secondary | ICD-10-CM | POA: Diagnosis not present

## 2021-05-22 DIAGNOSIS — I10 Essential (primary) hypertension: Secondary | ICD-10-CM | POA: Diagnosis not present

## 2021-05-22 DIAGNOSIS — I5042 Chronic combined systolic (congestive) and diastolic (congestive) heart failure: Secondary | ICD-10-CM | POA: Diagnosis present

## 2021-05-22 DIAGNOSIS — E782 Mixed hyperlipidemia: Secondary | ICD-10-CM | POA: Diagnosis not present

## 2021-05-22 DIAGNOSIS — Z95810 Presence of automatic (implantable) cardiac defibrillator: Secondary | ICD-10-CM | POA: Diagnosis not present

## 2021-05-22 DIAGNOSIS — I5022 Chronic systolic (congestive) heart failure: Secondary | ICD-10-CM | POA: Diagnosis not present

## 2021-05-22 DIAGNOSIS — N179 Acute kidney failure, unspecified: Secondary | ICD-10-CM | POA: Diagnosis not present

## 2021-05-22 DIAGNOSIS — T82838A Hemorrhage of vascular prosthetic devices, implants and grafts, initial encounter: Secondary | ICD-10-CM | POA: Diagnosis not present

## 2021-05-22 DIAGNOSIS — E785 Hyperlipidemia, unspecified: Secondary | ICD-10-CM | POA: Diagnosis not present

## 2021-05-22 DIAGNOSIS — I701 Atherosclerosis of renal artery: Secondary | ICD-10-CM | POA: Diagnosis present

## 2021-05-22 DIAGNOSIS — E869 Volume depletion, unspecified: Secondary | ICD-10-CM | POA: Diagnosis not present

## 2021-05-22 DIAGNOSIS — R1900 Intra-abdominal and pelvic swelling, mass and lump, unspecified site: Secondary | ICD-10-CM | POA: Diagnosis present

## 2021-05-22 DIAGNOSIS — J449 Chronic obstructive pulmonary disease, unspecified: Secondary | ICD-10-CM | POA: Diagnosis present

## 2021-05-22 DIAGNOSIS — R310 Gross hematuria: Secondary | ICD-10-CM | POA: Diagnosis not present

## 2021-05-22 DIAGNOSIS — E871 Hypo-osmolality and hyponatremia: Secondary | ICD-10-CM

## 2021-05-22 DIAGNOSIS — Y841 Kidney dialysis as the cause of abnormal reaction of the patient, or of later complication, without mention of misadventure at the time of the procedure: Secondary | ICD-10-CM | POA: Diagnosis not present

## 2021-05-22 DIAGNOSIS — N184 Chronic kidney disease, stage 4 (severe): Secondary | ICD-10-CM | POA: Diagnosis not present

## 2021-05-22 DIAGNOSIS — E860 Dehydration: Secondary | ICD-10-CM | POA: Diagnosis not present

## 2021-05-22 DIAGNOSIS — Z951 Presence of aortocoronary bypass graft: Secondary | ICD-10-CM

## 2021-05-22 DIAGNOSIS — E160 Drug-induced hypoglycemia without coma: Secondary | ICD-10-CM | POA: Diagnosis not present

## 2021-05-22 DIAGNOSIS — E1151 Type 2 diabetes mellitus with diabetic peripheral angiopathy without gangrene: Secondary | ICD-10-CM | POA: Diagnosis present

## 2021-05-22 DIAGNOSIS — Z800 Family history of malignant neoplasm of digestive organs: Secondary | ICD-10-CM | POA: Diagnosis not present

## 2021-05-22 DIAGNOSIS — Z6825 Body mass index (BMI) 25.0-25.9, adult: Secondary | ICD-10-CM

## 2021-05-22 DIAGNOSIS — N136 Pyonephrosis: Secondary | ICD-10-CM | POA: Diagnosis present

## 2021-05-22 DIAGNOSIS — E440 Moderate protein-calorie malnutrition: Secondary | ICD-10-CM | POA: Diagnosis not present

## 2021-05-22 DIAGNOSIS — N170 Acute kidney failure with tubular necrosis: Principal | ICD-10-CM | POA: Diagnosis present

## 2021-05-22 DIAGNOSIS — I509 Heart failure, unspecified: Secondary | ICD-10-CM | POA: Diagnosis not present

## 2021-05-22 DIAGNOSIS — I4891 Unspecified atrial fibrillation: Secondary | ICD-10-CM | POA: Diagnosis not present

## 2021-05-22 DIAGNOSIS — M7989 Other specified soft tissue disorders: Secondary | ICD-10-CM | POA: Diagnosis not present

## 2021-05-22 DIAGNOSIS — Z955 Presence of coronary angioplasty implant and graft: Secondary | ICD-10-CM

## 2021-05-22 DIAGNOSIS — I2510 Atherosclerotic heart disease of native coronary artery without angina pectoris: Secondary | ICD-10-CM | POA: Diagnosis present

## 2021-05-22 DIAGNOSIS — I959 Hypotension, unspecified: Secondary | ICD-10-CM | POA: Diagnosis present

## 2021-05-22 DIAGNOSIS — E039 Hypothyroidism, unspecified: Secondary | ICD-10-CM | POA: Diagnosis not present

## 2021-05-22 DIAGNOSIS — R5381 Other malaise: Secondary | ICD-10-CM | POA: Diagnosis not present

## 2021-05-22 DIAGNOSIS — N130 Hydronephrosis with ureteropelvic junction obstruction: Secondary | ICD-10-CM | POA: Diagnosis not present

## 2021-05-22 DIAGNOSIS — J9611 Chronic respiratory failure with hypoxia: Secondary | ICD-10-CM | POA: Diagnosis not present

## 2021-05-22 DIAGNOSIS — Z20822 Contact with and (suspected) exposure to covid-19: Secondary | ICD-10-CM | POA: Diagnosis present

## 2021-05-22 DIAGNOSIS — E1122 Type 2 diabetes mellitus with diabetic chronic kidney disease: Secondary | ICD-10-CM | POA: Diagnosis not present

## 2021-05-22 DIAGNOSIS — M109 Gout, unspecified: Secondary | ICD-10-CM | POA: Diagnosis not present

## 2021-05-22 DIAGNOSIS — N19 Unspecified kidney failure: Secondary | ICD-10-CM | POA: Diagnosis not present

## 2021-05-22 DIAGNOSIS — I5021 Acute systolic (congestive) heart failure: Secondary | ICD-10-CM | POA: Diagnosis not present

## 2021-05-22 DIAGNOSIS — T383X5A Adverse effect of insulin and oral hypoglycemic [antidiabetic] drugs, initial encounter: Secondary | ICD-10-CM | POA: Diagnosis not present

## 2021-05-22 DIAGNOSIS — J4489 Other specified chronic obstructive pulmonary disease: Secondary | ICD-10-CM | POA: Diagnosis present

## 2021-05-22 DIAGNOSIS — Z806 Family history of leukemia: Secondary | ICD-10-CM | POA: Diagnosis not present

## 2021-05-22 DIAGNOSIS — E11649 Type 2 diabetes mellitus with hypoglycemia without coma: Secondary | ICD-10-CM | POA: Diagnosis not present

## 2021-05-22 DIAGNOSIS — E669 Obesity, unspecified: Secondary | ICD-10-CM | POA: Diagnosis present

## 2021-05-22 DIAGNOSIS — R791 Abnormal coagulation profile: Secondary | ICD-10-CM | POA: Diagnosis present

## 2021-05-22 DIAGNOSIS — D631 Anemia in chronic kidney disease: Secondary | ICD-10-CM | POA: Diagnosis not present

## 2021-05-22 DIAGNOSIS — Z992 Dependence on renal dialysis: Secondary | ICD-10-CM | POA: Diagnosis not present

## 2021-05-22 DIAGNOSIS — E8809 Other disorders of plasma-protein metabolism, not elsewhere classified: Secondary | ICD-10-CM

## 2021-05-22 DIAGNOSIS — Z79899 Other long term (current) drug therapy: Secondary | ICD-10-CM

## 2021-05-22 DIAGNOSIS — Z87891 Personal history of nicotine dependence: Secondary | ICD-10-CM

## 2021-05-22 DIAGNOSIS — M1990 Unspecified osteoarthritis, unspecified site: Secondary | ICD-10-CM | POA: Diagnosis present

## 2021-05-22 DIAGNOSIS — I5043 Acute on chronic combined systolic (congestive) and diastolic (congestive) heart failure: Secondary | ICD-10-CM | POA: Diagnosis not present

## 2021-05-22 DIAGNOSIS — R319 Hematuria, unspecified: Secondary | ICD-10-CM | POA: Diagnosis present

## 2021-05-22 DIAGNOSIS — K219 Gastro-esophageal reflux disease without esophagitis: Secondary | ICD-10-CM | POA: Diagnosis present

## 2021-05-22 DIAGNOSIS — I130 Hypertensive heart and chronic kidney disease with heart failure and stage 1 through stage 4 chronic kidney disease, or unspecified chronic kidney disease: Secondary | ICD-10-CM | POA: Diagnosis not present

## 2021-05-22 DIAGNOSIS — N320 Bladder-neck obstruction: Secondary | ICD-10-CM

## 2021-05-22 DIAGNOSIS — I4821 Permanent atrial fibrillation: Secondary | ICD-10-CM | POA: Diagnosis not present

## 2021-05-22 DIAGNOSIS — Z8249 Family history of ischemic heart disease and other diseases of the circulatory system: Secondary | ICD-10-CM | POA: Diagnosis not present

## 2021-05-23 ENCOUNTER — Other Ambulatory Visit: Payer: Self-pay

## 2021-05-23 ENCOUNTER — Encounter (HOSPITAL_COMMUNITY): Payer: Self-pay | Admitting: Emergency Medicine

## 2021-05-23 ENCOUNTER — Inpatient Hospital Stay (HOSPITAL_COMMUNITY): Payer: Medicare Other

## 2021-05-23 ENCOUNTER — Other Ambulatory Visit (HOSPITAL_COMMUNITY): Payer: Self-pay

## 2021-05-23 DIAGNOSIS — I4821 Permanent atrial fibrillation: Secondary | ICD-10-CM | POA: Diagnosis not present

## 2021-05-23 DIAGNOSIS — N170 Acute kidney failure with tubular necrosis: Secondary | ICD-10-CM | POA: Diagnosis present

## 2021-05-23 DIAGNOSIS — E871 Hypo-osmolality and hyponatremia: Secondary | ICD-10-CM | POA: Diagnosis not present

## 2021-05-23 DIAGNOSIS — I130 Hypertensive heart and chronic kidney disease with heart failure and stage 1 through stage 4 chronic kidney disease, or unspecified chronic kidney disease: Secondary | ICD-10-CM | POA: Diagnosis not present

## 2021-05-23 DIAGNOSIS — R5381 Other malaise: Secondary | ICD-10-CM | POA: Diagnosis not present

## 2021-05-23 DIAGNOSIS — E1122 Type 2 diabetes mellitus with diabetic chronic kidney disease: Secondary | ICD-10-CM | POA: Diagnosis not present

## 2021-05-23 DIAGNOSIS — I132 Hypertensive heart and chronic kidney disease with heart failure and with stage 5 chronic kidney disease, or end stage renal disease: Secondary | ICD-10-CM | POA: Diagnosis present

## 2021-05-23 DIAGNOSIS — R319 Hematuria, unspecified: Secondary | ICD-10-CM | POA: Diagnosis not present

## 2021-05-23 DIAGNOSIS — N186 End stage renal disease: Secondary | ICD-10-CM | POA: Diagnosis not present

## 2021-05-23 DIAGNOSIS — I10 Essential (primary) hypertension: Secondary | ICD-10-CM

## 2021-05-23 DIAGNOSIS — E440 Moderate protein-calorie malnutrition: Secondary | ICD-10-CM | POA: Diagnosis not present

## 2021-05-23 DIAGNOSIS — E8809 Other disorders of plasma-protein metabolism, not elsewhere classified: Secondary | ICD-10-CM

## 2021-05-23 DIAGNOSIS — N136 Pyonephrosis: Secondary | ICD-10-CM | POA: Diagnosis present

## 2021-05-23 DIAGNOSIS — E039 Hypothyroidism, unspecified: Secondary | ICD-10-CM

## 2021-05-23 DIAGNOSIS — I4891 Unspecified atrial fibrillation: Secondary | ICD-10-CM | POA: Diagnosis present

## 2021-05-23 DIAGNOSIS — E11649 Type 2 diabetes mellitus with hypoglycemia without coma: Secondary | ICD-10-CM | POA: Diagnosis not present

## 2021-05-23 DIAGNOSIS — E875 Hyperkalemia: Secondary | ICD-10-CM | POA: Diagnosis present

## 2021-05-23 DIAGNOSIS — J9611 Chronic respiratory failure with hypoxia: Secondary | ICD-10-CM | POA: Diagnosis not present

## 2021-05-23 DIAGNOSIS — Z992 Dependence on renal dialysis: Secondary | ICD-10-CM | POA: Diagnosis not present

## 2021-05-23 DIAGNOSIS — T82838A Hemorrhage of vascular prosthetic devices, implants and grafts, initial encounter: Secondary | ICD-10-CM | POA: Diagnosis not present

## 2021-05-23 DIAGNOSIS — I701 Atherosclerosis of renal artery: Secondary | ICD-10-CM | POA: Diagnosis present

## 2021-05-23 DIAGNOSIS — Z20822 Contact with and (suspected) exposure to covid-19: Secondary | ICD-10-CM | POA: Diagnosis present

## 2021-05-23 DIAGNOSIS — Z95810 Presence of automatic (implantable) cardiac defibrillator: Secondary | ICD-10-CM

## 2021-05-23 DIAGNOSIS — J449 Chronic obstructive pulmonary disease, unspecified: Secondary | ICD-10-CM | POA: Diagnosis present

## 2021-05-23 DIAGNOSIS — E782 Mixed hyperlipidemia: Secondary | ICD-10-CM

## 2021-05-23 DIAGNOSIS — I509 Heart failure, unspecified: Secondary | ICD-10-CM | POA: Diagnosis not present

## 2021-05-23 DIAGNOSIS — D62 Acute posthemorrhagic anemia: Secondary | ICD-10-CM | POA: Diagnosis not present

## 2021-05-23 DIAGNOSIS — T383X5A Adverse effect of insulin and oral hypoglycemic [antidiabetic] drugs, initial encounter: Secondary | ICD-10-CM

## 2021-05-23 DIAGNOSIS — I5043 Acute on chronic combined systolic (congestive) and diastolic (congestive) heart failure: Secondary | ICD-10-CM | POA: Diagnosis not present

## 2021-05-23 DIAGNOSIS — N189 Chronic kidney disease, unspecified: Secondary | ICD-10-CM | POA: Diagnosis not present

## 2021-05-23 DIAGNOSIS — E1151 Type 2 diabetes mellitus with diabetic peripheral angiopathy without gangrene: Secondary | ICD-10-CM | POA: Diagnosis not present

## 2021-05-23 DIAGNOSIS — I959 Hypotension, unspecified: Secondary | ICD-10-CM | POA: Diagnosis not present

## 2021-05-23 DIAGNOSIS — D631 Anemia in chronic kidney disease: Secondary | ICD-10-CM | POA: Diagnosis not present

## 2021-05-23 DIAGNOSIS — N179 Acute kidney failure, unspecified: Secondary | ICD-10-CM | POA: Diagnosis not present

## 2021-05-23 DIAGNOSIS — I4901 Ventricular fibrillation: Secondary | ICD-10-CM | POA: Diagnosis not present

## 2021-05-23 DIAGNOSIS — I5021 Acute systolic (congestive) heart failure: Secondary | ICD-10-CM | POA: Diagnosis not present

## 2021-05-23 DIAGNOSIS — Z4901 Encounter for fitting and adjustment of extracorporeal dialysis catheter: Secondary | ICD-10-CM | POA: Diagnosis not present

## 2021-05-23 DIAGNOSIS — D649 Anemia, unspecified: Secondary | ICD-10-CM | POA: Diagnosis not present

## 2021-05-23 DIAGNOSIS — E860 Dehydration: Secondary | ICD-10-CM | POA: Diagnosis present

## 2021-05-23 DIAGNOSIS — I5042 Chronic combined systolic (congestive) and diastolic (congestive) heart failure: Secondary | ICD-10-CM | POA: Diagnosis present

## 2021-05-23 DIAGNOSIS — I5022 Chronic systolic (congestive) heart failure: Secondary | ICD-10-CM | POA: Diagnosis not present

## 2021-05-23 DIAGNOSIS — N184 Chronic kidney disease, stage 4 (severe): Secondary | ICD-10-CM | POA: Diagnosis not present

## 2021-05-23 DIAGNOSIS — E669 Obesity, unspecified: Secondary | ICD-10-CM | POA: Diagnosis present

## 2021-05-23 DIAGNOSIS — Y841 Kidney dialysis as the cause of abnormal reaction of the patient, or of later complication, without mention of misadventure at the time of the procedure: Secondary | ICD-10-CM | POA: Diagnosis not present

## 2021-05-23 DIAGNOSIS — E160 Drug-induced hypoglycemia without coma: Secondary | ICD-10-CM

## 2021-05-23 DIAGNOSIS — M7989 Other specified soft tissue disorders: Secondary | ICD-10-CM | POA: Diagnosis not present

## 2021-05-23 DIAGNOSIS — N281 Cyst of kidney, acquired: Secondary | ICD-10-CM | POA: Diagnosis not present

## 2021-05-23 LAB — HEMOGLOBIN A1C
Hgb A1c MFr Bld: 5.4 % (ref 4.8–5.6)
Mean Plasma Glucose: 108.28 mg/dL

## 2021-05-23 LAB — URINALYSIS, ROUTINE W REFLEX MICROSCOPIC
Bilirubin Urine: NEGATIVE
Glucose, UA: 50 mg/dL — AB
Ketones, ur: NEGATIVE mg/dL
Nitrite: NEGATIVE
Protein, ur: 100 mg/dL — AB
RBC / HPF: >50 RBC/hpf — ABNORMAL HIGH (ref 0–5)
Specific Gravity, Urine: 1.011 (ref 1.005–1.030)
pH: 6 (ref 5.0–8.0)

## 2021-05-23 LAB — BASIC METABOLIC PANEL
Anion gap: 12 (ref 5–15)
Anion gap: 11 (ref 5–15)
BUN: 92 mg/dL — ABNORMAL HIGH (ref 8–23)
BUN: 90 mg/dL — ABNORMAL HIGH (ref 8–23)
CO2: 18 mmol/L — ABNORMAL LOW (ref 22–32)
CO2: 19 mmol/L — ABNORMAL LOW (ref 22–32)
Calcium: 8.7 mg/dL — ABNORMAL LOW (ref 8.9–10.3)
Calcium: 8.7 mg/dL — ABNORMAL LOW (ref 8.9–10.3)
Chloride: 104 mmol/L (ref 98–111)
Chloride: 105 mmol/L (ref 98–111)
Creatinine, Ser: 10.01 mg/dL — ABNORMAL HIGH (ref 0.61–1.24)
Creatinine, Ser: 9.64 mg/dL — ABNORMAL HIGH (ref 0.61–1.24)
GFR, Estimated: 5 mL/min — ABNORMAL LOW (ref >60)
GFR, Estimated: 5 mL/min — ABNORMAL LOW (ref >60)
Glucose, Bld: 100 mg/dL — ABNORMAL HIGH (ref 70–99)
Glucose, Bld: 88 mg/dL (ref 70–99)
Potassium: 6.7 mmol/L (ref 3.5–5.1)
Potassium: 6 mmol/L — ABNORMAL HIGH (ref 3.5–5.1)
Sodium: 134 mmol/L — ABNORMAL LOW (ref 135–145)
Sodium: 135 mmol/L (ref 135–145)

## 2021-05-23 LAB — MRSA NEXT GEN BY PCR, NASAL: MRSA by PCR Next Gen: NOT DETECTED

## 2021-05-23 LAB — CBC WITH DIFFERENTIAL/PLATELET
Abs Immature Granulocytes: 0.04 10*3/uL (ref 0.00–0.07)
Basophils Absolute: 0.1 10*3/uL (ref 0.0–0.1)
Basophils Relative: 1 %
Eosinophils Absolute: 0.4 10*3/uL (ref 0.0–0.5)
Eosinophils Relative: 4 %
HCT: 25.1 % — ABNORMAL LOW (ref 39.0–52.0)
Hemoglobin: 8.2 g/dL — ABNORMAL LOW (ref 13.0–17.0)
Immature Granulocytes: 0 %
Lymphocytes Relative: 9 %
Lymphs Abs: 1 10*3/uL (ref 0.7–4.0)
MCH: 31.3 pg (ref 26.0–34.0)
MCHC: 32.7 g/dL (ref 30.0–36.0)
MCV: 95.8 fL (ref 80.0–100.0)
Monocytes Absolute: 1.1 10*3/uL — ABNORMAL HIGH (ref 0.1–1.0)
Monocytes Relative: 11 %
Neutro Abs: 7.7 10*3/uL (ref 1.7–7.7)
Neutrophils Relative %: 75 %
Platelets: 337 10*3/uL (ref 150–400)
RBC: 2.62 MIL/uL — ABNORMAL LOW (ref 4.22–5.81)
RDW: 13 % (ref 11.5–15.5)
WBC: 10.3 10*3/uL (ref 4.0–10.5)
nRBC: 0 % (ref 0.0–0.2)

## 2021-05-23 LAB — COMPREHENSIVE METABOLIC PANEL
ALT: 9 U/L (ref 0–44)
ALT: 7 U/L (ref 0–44)
AST: 14 U/L — ABNORMAL LOW (ref 15–41)
AST: 13 U/L — ABNORMAL LOW (ref 15–41)
Albumin: 3 g/dL — ABNORMAL LOW (ref 3.5–5.0)
Albumin: 2.8 g/dL — ABNORMAL LOW (ref 3.5–5.0)
Alkaline Phosphatase: 38 U/L (ref 38–126)
Alkaline Phosphatase: 33 U/L — ABNORMAL LOW (ref 38–126)
Anion gap: 12 (ref 5–15)
Anion gap: 10 (ref 5–15)
BUN: 91 mg/dL — ABNORMAL HIGH (ref 8–23)
BUN: 94 mg/dL — ABNORMAL HIGH (ref 8–23)
CO2: 19 mmol/L — ABNORMAL LOW (ref 22–32)
CO2: 22 mmol/L (ref 22–32)
Calcium: 8.9 mg/dL (ref 8.9–10.3)
Calcium: 9.4 mg/dL (ref 8.9–10.3)
Chloride: 99 mmol/L (ref 98–111)
Chloride: 104 mmol/L (ref 98–111)
Creatinine, Ser: 10.49 mg/dL — ABNORMAL HIGH (ref 0.61–1.24)
Creatinine, Ser: 10.52 mg/dL — ABNORMAL HIGH (ref 0.61–1.24)
GFR, Estimated: 5 mL/min — ABNORMAL LOW (ref >60)
GFR, Estimated: 4 mL/min — ABNORMAL LOW (ref >60)
Glucose, Bld: 96 mg/dL (ref 70–99)
Glucose, Bld: 59 mg/dL — ABNORMAL LOW (ref 70–99)
Potassium: 6.9 mmol/L (ref 3.5–5.1)
Potassium: 5.9 mmol/L — ABNORMAL HIGH (ref 3.5–5.1)
Sodium: 130 mmol/L — ABNORMAL LOW (ref 135–145)
Sodium: 136 mmol/L (ref 135–145)
Total Bilirubin: 0.6 mg/dL (ref 0.3–1.2)
Total Bilirubin: 0.6 mg/dL (ref 0.3–1.2)
Total Protein: 6 g/dL — ABNORMAL LOW (ref 6.5–8.1)
Total Protein: 5.3 g/dL — ABNORMAL LOW (ref 6.5–8.1)

## 2021-05-23 LAB — CBC
HCT: 22.7 % — ABNORMAL LOW (ref 39.0–52.0)
Hemoglobin: 7.6 g/dL — ABNORMAL LOW (ref 13.0–17.0)
MCH: 31.8 pg (ref 26.0–34.0)
MCHC: 33.5 g/dL (ref 30.0–36.0)
MCV: 95 fL (ref 80.0–100.0)
Platelets: 279 10*3/uL (ref 150–400)
RBC: 2.39 MIL/uL — ABNORMAL LOW (ref 4.22–5.81)
RDW: 12.8 % (ref 11.5–15.5)
WBC: 9.8 10*3/uL (ref 4.0–10.5)
nRBC: 0 % (ref 0.0–0.2)

## 2021-05-23 LAB — PROTEIN / CREATININE RATIO, URINE
Creatinine, Urine: 68.98 mg/dL
Protein Creatinine Ratio: 1.94 mg/mg{Cre} — ABNORMAL HIGH (ref 0.00–0.15)
Total Protein, Urine: 134 mg/dL

## 2021-05-23 LAB — CBG MONITORING, ED
Glucose-Capillary: 90 mg/dL (ref 70–99)
Glucose-Capillary: 47 mg/dL — ABNORMAL LOW (ref 70–99)
Glucose-Capillary: 85 mg/dL (ref 70–99)
Glucose-Capillary: 93 mg/dL (ref 70–99)
Glucose-Capillary: 99 mg/dL (ref 70–99)

## 2021-05-23 LAB — PROTIME-INR
INR: 2.7 — ABNORMAL HIGH (ref 0.8–1.2)
Prothrombin Time: 28.2 seconds — ABNORMAL HIGH (ref 11.4–15.2)

## 2021-05-23 LAB — GLUCOSE, CAPILLARY
Glucose-Capillary: 131 mg/dL — ABNORMAL HIGH (ref 70–99)
Glucose-Capillary: 122 mg/dL — ABNORMAL HIGH (ref 70–99)
Glucose-Capillary: 88 mg/dL (ref 70–99)

## 2021-05-23 LAB — PHOSPHORUS: Phosphorus: 4.8 mg/dL — ABNORMAL HIGH (ref 2.5–4.6)

## 2021-05-23 LAB — SARS CORONAVIRUS 2 (TAT 6-24 HRS): SARS Coronavirus 2: NEGATIVE

## 2021-05-23 LAB — APTT: aPTT: 61 seconds — ABNORMAL HIGH (ref 24–36)

## 2021-05-23 LAB — MAGNESIUM: Magnesium: 2.3 mg/dL (ref 1.7–2.4)

## 2021-05-23 MED ORDER — SODIUM CHLORIDE 0.9 % IV BOLUS
1000.0000 mL | Freq: Once | INTRAVENOUS | Status: AC
  Administered 2021-05-23: 1000 mL via INTRAVENOUS

## 2021-05-23 MED ORDER — SODIUM ZIRCONIUM CYCLOSILICATE 10 G PO PACK
10.0000 g | PACK | Freq: Once | ORAL | Status: AC
  Administered 2021-05-23: 10 g via ORAL
  Filled 2021-05-23: qty 1

## 2021-05-23 MED ORDER — DM-GUAIFENESIN ER 30-600 MG PO TB12
1.0000 | ORAL_TABLET | Freq: Two times a day (BID) | ORAL | Status: DC | PRN
  Filled 2021-05-23: qty 1

## 2021-05-23 MED ORDER — WARFARIN - PHYSICIAN DOSING INPATIENT: Freq: Every day | Status: DC

## 2021-05-23 MED ORDER — BUDESONIDE 0.25 MG/2ML IN SUSP: 0.2500 mg | RESPIRATORY_TRACT | Status: DC

## 2021-05-23 MED ORDER — FUROSEMIDE 10 MG/ML IJ SOLN
40.0000 mg | Freq: Once | INTRAMUSCULAR | Status: AC
  Administered 2021-05-23: 40 mg via INTRAVENOUS
  Filled 2021-05-23: qty 4

## 2021-05-23 MED ORDER — CALCIUM GLUCONATE-NACL 1-0.675 GM/50ML-% IV SOLN
1.0000 g | Freq: Once | INTRAVENOUS | Status: AC
  Administered 2021-05-23: 1000 mg via INTRAVENOUS
  Filled 2021-05-23: qty 50

## 2021-05-23 MED ORDER — CHLORHEXIDINE GLUCONATE CLOTH 2 % EX PADS
6.0000 | MEDICATED_PAD | Freq: Every day | CUTANEOUS | Status: DC
  Administered 2021-05-23 – 2021-06-04 (×12): 6 via TOPICAL

## 2021-05-23 MED ORDER — SODIUM BICARBONATE 8.4 % IV SOLN
50.0000 meq | Freq: Once | INTRAVENOUS | Status: AC
  Administered 2021-05-23: 50 meq via INTRAVENOUS
  Filled 2021-05-23: qty 50

## 2021-05-23 MED ORDER — SODIUM CHLORIDE 0.9 % IV SOLN: Freq: Once | INTRAVENOUS | Status: AC

## 2021-05-23 MED ORDER — LEVOTHYROXINE SODIUM 112 MCG PO TABS
112.0000 ug | ORAL_TABLET | Freq: Every day | ORAL | Status: DC
  Administered 2021-05-24 – 2021-06-05 (×12): 112 ug via ORAL
  Filled 2021-05-23 (×12): qty 1

## 2021-05-23 MED ORDER — ALBUTEROL SULFATE (2.5 MG/3ML) 0.083% IN NEBU
2.5000 mg | INHALATION_SOLUTION | Freq: Four times a day (QID) | RESPIRATORY_TRACT | Status: DC | PRN
  Administered 2021-05-24 – 2021-06-05 (×12): 2.5 mg via RESPIRATORY_TRACT
  Filled 2021-05-23 (×13): qty 3

## 2021-05-23 MED ORDER — SODIUM BICARBONATE 8.4 % IV SOLN
25.0000 meq | Freq: Once | INTRAVENOUS | Status: AC
  Administered 2021-05-23: 25 meq via INTRAVENOUS
  Filled 2021-05-23: qty 50

## 2021-05-23 MED ORDER — HYDRALAZINE HCL 50 MG PO TABS
50.0000 mg | ORAL_TABLET | Freq: Three times a day (TID) | ORAL | Status: DC
  Administered 2021-05-23 – 2021-05-28 (×15): 50 mg via ORAL
  Filled 2021-05-23 (×16): qty 1

## 2021-05-23 MED ORDER — ALBUTEROL SULFATE HFA 108 (90 BASE) MCG/ACT IN AERS: 2.0000 | INHALATION_SPRAY | Freq: Four times a day (QID) | RESPIRATORY_TRACT | Status: DC | PRN

## 2021-05-23 MED ORDER — INSULIN ASPART 100 UNIT/ML IV SOLN
10.0000 [IU] | Freq: Once | INTRAVENOUS | Status: AC
  Administered 2021-05-23: 10 [IU] via INTRAVENOUS

## 2021-05-23 MED ORDER — SODIUM ZIRCONIUM CYCLOSILICATE 10 G PO PACK: 10.0000 g | PACK | Freq: Every day | ORAL | Status: DC

## 2021-05-23 MED ORDER — FERROUS SULFATE 325 (65 FE) MG PO TABS
325.0000 mg | ORAL_TABLET | Freq: Every day | ORAL | Status: DC
  Administered 2021-05-24 – 2021-06-05 (×10): 325 mg via ORAL
  Filled 2021-05-23 (×12): qty 1

## 2021-05-23 MED ORDER — SENNOSIDES-DOCUSATE SODIUM 8.6-50 MG PO TABS
1.0000 | ORAL_TABLET | Freq: Every evening | ORAL | Status: DC | PRN
  Administered 2021-06-05: 1 via ORAL
  Filled 2021-05-23: qty 1

## 2021-05-23 MED ORDER — ALBUTEROL SULFATE (2.5 MG/3ML) 0.083% IN NEBU
10.0000 mg | INHALATION_SOLUTION | Freq: Once | RESPIRATORY_TRACT | Status: AC
  Administered 2021-05-23: 10 mg via RESPIRATORY_TRACT
  Filled 2021-05-23: qty 12

## 2021-05-23 MED ORDER — SODIUM CHLORIDE 0.9 % IV SOLN: INTRAVENOUS | Status: AC

## 2021-05-23 MED ORDER — HYDRALAZINE HCL 20 MG/ML IJ SOLN
10.0000 mg | Freq: Once | INTRAMUSCULAR | Status: AC
  Administered 2021-05-23: 10 mg via INTRAVENOUS
  Filled 2021-05-23: qty 1

## 2021-05-23 MED ORDER — DEXTROSE 50 % IV SOLN
1.0000 | Freq: Once | INTRAVENOUS | Status: AC
  Administered 2021-05-23: 50 mL via INTRAVENOUS
  Filled 2021-05-23: qty 50

## 2021-05-23 MED ORDER — LEVOTHYROXINE SODIUM 112 MCG PO TABS: 112.0000 ug | ORAL_TABLET | Freq: Every day | ORAL | Status: DC

## 2021-05-23 MED ORDER — ALBUTEROL SULFATE (2.5 MG/3ML) 0.083% IN NEBU
5.0000 mg | INHALATION_SOLUTION | Freq: Once | RESPIRATORY_TRACT | Status: AC
  Administered 2021-05-23: 5 mg via RESPIRATORY_TRACT
  Filled 2021-05-23: qty 6

## 2021-05-23 MED ORDER — SODIUM ZIRCONIUM CYCLOSILICATE 10 G PO PACK
10.0000 g | PACK | Freq: Three times a day (TID) | ORAL | Status: DC
  Administered 2021-05-23 – 2021-05-26 (×12): 10 g via ORAL
  Filled 2021-05-23 (×13): qty 1

## 2021-05-23 MED ORDER — IPRATROPIUM-ALBUTEROL 0.5-2.5 (3) MG/3ML IN SOLN
3.0000 mL | Freq: Four times a day (QID) | RESPIRATORY_TRACT | Status: DC
  Administered 2021-05-23 – 2021-05-24 (×3): 3 mL via RESPIRATORY_TRACT
  Filled 2021-05-23 (×3): qty 3

## 2021-05-23 MED ORDER — WARFARIN SODIUM 2.5 MG PO TABS
2.5000 mg | ORAL_TABLET | Freq: Every day | ORAL | Status: DC
  Administered 2021-05-23 – 2021-05-24 (×2): 2.5 mg via ORAL
  Filled 2021-05-23 (×3): qty 1

## 2021-05-23 MED ORDER — TRAZODONE HCL 50 MG PO TABS
50.0000 mg | ORAL_TABLET | Freq: Every evening | ORAL | Status: DC | PRN
  Administered 2021-05-25 – 2021-06-02 (×4): 50 mg via ORAL
  Filled 2021-05-23 (×4): qty 1

## 2021-05-23 MED ORDER — INSULIN ASPART 100 UNIT/ML IJ SOLN: 0.0000 [IU] | Freq: Three times a day (TID) | INTRAMUSCULAR | Status: DC

## 2021-05-23 MED ORDER — CARVEDILOL 6.25 MG PO TABS
9.3750 mg | ORAL_TABLET | Freq: Two times a day (BID) | ORAL | Status: DC
  Administered 2021-05-23 – 2021-05-28 (×10): 9.375 mg via ORAL
  Filled 2021-05-23: qty 3
  Filled 2021-05-23 (×11): qty 1

## 2021-05-23 MED ORDER — ACETAMINOPHEN 325 MG PO TABS
650.0000 mg | ORAL_TABLET | Freq: Four times a day (QID) | ORAL | Status: DC | PRN
  Administered 2021-05-23 – 2021-06-05 (×13): 650 mg via ORAL
  Filled 2021-05-23 (×13): qty 2

## 2021-05-23 MED ORDER — PRAVASTATIN SODIUM 40 MG PO TABS
40.0000 mg | ORAL_TABLET | Freq: Every day | ORAL | Status: DC
  Administered 2021-05-23 – 2021-06-04 (×12): 40 mg via ORAL
  Filled 2021-05-23 (×12): qty 1

## 2021-05-23 MED ORDER — GLUCERNA SHAKE PO LIQD
237.0000 mL | Freq: Three times a day (TID) | ORAL | Status: DC
  Administered 2021-05-23 – 2021-05-24 (×5): 237 mL via ORAL
  Filled 2021-05-23 (×2): qty 237

## 2021-05-23 MED ORDER — PANTOPRAZOLE SODIUM 40 MG PO TBEC
40.0000 mg | DELAYED_RELEASE_TABLET | Freq: Two times a day (BID) | ORAL | Status: DC
  Administered 2021-05-23 – 2021-06-05 (×23): 40 mg via ORAL
  Filled 2021-05-23 (×26): qty 1

## 2021-05-23 MED ORDER — HEPARIN SODIUM (PORCINE) 5000 UNIT/ML IJ SOLN
5000.0000 [IU] | Freq: Three times a day (TID) | INTRAMUSCULAR | Status: DC
  Administered 2021-05-23: 5000 [IU] via SUBCUTANEOUS
  Filled 2021-05-23: qty 1

## 2021-05-23 MED ORDER — INSULIN ASPART 100 UNIT/ML IJ SOLN: 0.0000 [IU] | Freq: Every day | INTRAMUSCULAR | Status: DC

## 2021-05-23 MED ORDER — BUDESONIDE 0.25 MG/2ML IN SUSP
0.2500 mg | Freq: Two times a day (BID) | RESPIRATORY_TRACT | Status: DC
  Administered 2021-05-23 – 2021-06-05 (×24): 0.25 mg via RESPIRATORY_TRACT
  Filled 2021-05-23 (×25): qty 2

## 2021-05-23 NOTE — Progress Notes (Signed)
Repeat labs from 3pm reviewed. Repeat EKG looks ok.  Lasix, IVF, Cal Gluc, Bicarb, Albuterol and D50/Insulin ordered. Spoke with Dr Carolin Sicks, will repeat labs at 9pm again.

## 2021-05-23 NOTE — ED Provider Notes (Signed)
[facility] DEPART[facility] Provider Note   CSN: [id number] Arrival date & time: 05/22/21  2352     History No chief complaint on file.   [name] [name] is a 80 y.o. male.  Patient is an 80 year old male with past medical history of coronary artery disease with CABG, AICD, atrial fibrillation, CHF, chronic renal insufficiency, COPD.  Patient sent by his primary doctor for evaluation of abnormal laboratory studies.  He currently has a renal issue that we will require surgery.  He was undergoing preop testing when he was found to have abnormal renal function.  According to the wife, the doctor called her and told him to get him to the ER this evening.  Patient reports increasing fatigue and weakness with difficulty walking over the past several days.  The history is provided by the patient.      Past Medical History:  Diagnosis Date   AICD (automatic cardioverter/defibrillator) present 04/05/2017   Anemia    Arthritis    "hips; back" (07/09/2014)   Asthma    Atrial fibrillation (HCC)    CAD (coronary artery disease)    CHF (congestive heart failure) ([place])    Cholecystitis 11/2013   CKD (chronic kidney disease) 2015   Stage  3.    COPD (chronic obstructive pulmonary disease) (HCC)    on home oxygen, 2 liters at night10/2015   Esophageal reflux    Gallstones    Gastric antral vascular ectasia 2013   Gout    Heme positive stool    Hepatomegaly    Hiatal hernia    History of blood transfusion ~ 2012   "blood count dropped; had to get 3 units"   Hyperlipidemia    Hypothyroidism    Other and unspecified coagulation defects    Personal history of colonic polyps 05/29/2010   TUBULAR ADENOMA   Unspecified essential hypertension     Patient Active Problem List   Diagnosis Date Noted   Hematuria of unknown cause 05/13/2021   ICD (implantable cardioverter-defibrillator) in place 05/04/2019   NSVT (nonsustained ventricular tachycardia) ([place]) 05/04/2019    Hypothyroidism 01/16/2018   Bradycardia 03/18/2017   GI bleed 03/22/2016   Dyspnea 03/22/2016   GAVE (gastric antral vascular ectasia)    CKD (chronic kidney disease), stage III ([place]) 01/06/2015   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[place]) 07/09/2014   Coronary atherosclerosis of native coronary artery 06/04/2014   Atrial fibrillation, chronic ([place]) 01/16/2013   Gout 12/16/2012   Hyperlipemia 12/16/2012   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   Anemia 66/03/3015   Chronic systolic CHF (congestive heart failure) ([place]) 11/26/2011   Chronic anticoagulation 11/26/2011   Hx of gastroesophageal reflux (GERD) 11/26/2011   Obesity 11/26/2011   Asthma with COPD ([place]) 11/26/2011   Iron deficiency anemia 05/13/2010   HEPATOMEGALY 05/13/2010   Essential hypertension 02/03/2008    Past Surgical History:  Procedure Laterality Date   CARDIAC CATHETERIZATION N/A 07/15/2016   Procedure: Right/Left Heart Cath and Coronary/Graft Angiography;  Surgeon: Belva Crome, MD;  Location: [facility];  Service: Cardiovascular;  Laterality: N/A;   cholecystomy tube  12/28/2014 - 01/06/2015   tube clogged with debris, removed and IR unable to place new tube.    COLONOSCOPY  2013   Dr. Shar[facility] Iles: no polyps or evidence of active bleeding   CORONARY ANGIOPLASTY WITH STENT PLACEMENT  ~ 2008; 07/08/2014   "1 + 3"   [place]   CABG X4  ESOPHAGOGASTRODUODENOSCOPY  2013   Dr. 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 Sharlett Iles: AVM at 19 and blood at 30 minutes beyond first duodenal image but not actual lesion seen. If persistent IDA, bleeding, recommend enteroscopy with ablation    HERNIA REPAIR     ICD IMPLANT N/A  04/05/2017   Procedure: ICD Implant;  Surgeon: Evans Lance, MD;  Location: [facility];  Service: Cardiovascular;  Laterality: N/A;   LEFT AND RIGHT HEART CATHETERIZATION WITH CORONARY/GRAFT ANGIOGRAM N/A 07/09/2014   Right and left heart cath, bare metal stent to SVG to RCA. Sinclair Grooms, MD;    UMBILICAL HERNIA REPAIR  [phone]       Family History  Problem Relation Age of Onset   Leukemia Mother    Kidney disease Mother        kidney removed    Heart attack Brother    Heart disease Brother    Heart disease Father    Stomach cancer Sister    Stomach cancer Sister    Early death Brother    Hypertension Other        family    Colon cancer Neg Hx     Social History   Tobacco Use   Smoking status: Former    Packs/day: 1.00    Years: 42.00    Pack years: 42.00    Types: Cigarettes    Quit date: 12/16/1996    Years since quitting: 24.4   Smokeless tobacco: Never  Vaping Use   Vaping Use: Never used  Substance Use Topics   Alcohol use: Yes    Alcohol/week: 1.0 standard drink    Types: 1 Cans of beer per week    Comment: 1 per month   Drug use: No    Home Medications Prior to Admission medications   Medication Sig Start Date End Date Taking? Authorizing Provider  acetaminophen (TYLENOL) 500 MG tablet Take 500 mg by mouth every 6 (six) hours as needed for headache or mild pain.   Yes [provider]  albuterol (VENTOLIN HFA) 108 (90 Base) MCG/ACT inhaler USE 2 INHALATIONS BY MOUTH  EVERY 6 HOURS AS NEEDED FOR SHORTNESS OF BREATH Patient taking differently: Inhale 2 puffs into the lungs every 6 (six) hours as needed for shortness of breath. 04/17/21  Yes Stacks, [name], MD  budesonide (PULMICORT) 0.25 MG/2ML nebulizer solution USE 1 VIAL  IN  NEBULIZER TWICE  DAILY - rinse mouth after treatment Patient taking differently: Take 0.25 mg by nebulization See admin instructions. USE 1 VIAL  IN  NEBULIZER TWICE DAILY - rinse mouth after treatment 03/03/21  Yes  Stacks, [name], MD  carvedilol (COREG) 6.25 MG tablet Take 1.5 tablets (9.375 mg total) by mouth 2 (two) times daily. 02/16/21  Yes Stacks, [name], MD  Cholecalciferol (VITAMIN D3) 5000 UNITS CAPS Take 5,000 Units by mouth daily.   Yes [provider]  docusate sodium (COLACE) 100 MG capsule Take 100 mg by mouth daily as needed for mild constipation.   Yes [provider]  empagliflozin (JARDIANCE) 10 MG TABS tablet Take 1 tablet (10 mg total) by mouth daily before breakfast. 11/24/20  Yes Claretta Fraise, MD  ferrous sulfate 325 (65 FE) MG tablet Take 325 mg by mouth daily  with breakfast.   Yes [provider]  hydrALAZINE (APRESOLINE) 50 MG tablet TAKE 1 TABLET BY MOUTH 3  TIMES DAILY Patient taking differently: Take 50 mg by mouth 3 (three) times daily. 07/21/20  Yes Larey Dresser, MD  HYDROcodone-acetaminophen (NORCO) 5-325 MG tablet Take 2 tablets by mouth every 4 (four) hours as needed. Patient taking differently: Take 1-2 tablets by mouth every 4 (four) hours as needed for moderate pain. 05/13/21  Yes Daleen Bo, MD  Hypromell-Glycerin-Naphazoline 0.8-0.25-0.012 % SOLN Place 1-2 drops into both eyes 3 (three) times daily as needed (for dry eyes.).   Yes [provider]  ipratropium-albuterol (DUONEB) 0.5-2.5 (3) MG/3ML SOLN USE 1 VIAL IN NEBULIZER 4 TIMES DAILY Patient taking differently: Take 3 mLs by nebulization 4 (four) times daily. 03/03/21  Yes Stacks, [name], MD  levothyroxine (SYNTHROID) 112 MCG tablet TAKE 1 TABLET BY MOUTH  DAILY BEFORE BREAKFAST Patient taking differently: Take 112 mcg by mouth daily before breakfast. 10/17/20  Yes Stacks, [name], MD  lovastatin (MEVACOR) 40 MG tablet Take 2 tablets (80 mg total) by mouth at bedtime. 09/09/20  Yes Claretta Fraise, MD  Multiple Vitamins-Minerals (MULTIVITAMINS THER. W/MINERALS) TABS Take 1 tablet by mouth daily.   Yes [provider]  nitroGLYCERIN (NITROSTAT) 0.4 MG SL tablet Place 1  tablet (0.4 mg total) under the tongue every 5 (five) minutes as needed for chest pain. 03/18/17  Yes Shirley Friar, PA-C  OXYGEN Inhale 2 L/min into the lungs See admin instructions. 2 L/min at bedtime and as needed daily as needed for shortness of breath   Yes [provider]  pantoprazole (PROTONIX) 40 MG tablet Take 1 tablet (40 mg total) by mouth 2 (two) times daily. Patient taking differently: Take 40 mg by mouth in the morning and at bedtime. 12/12/20  Yes Stacks, [name], MD  sacubitril-valsartan (ENTRESTO) 97-103 MG Take 1 tablet by mouth 2 (two) times daily. 10/23/20  Yes Larey Dresser, MD  spironolactone (ALDACTONE) 25 MG tablet Take 0.5 tablets (12.5 mg total) by mouth daily. 12/16/20  Yes Larey Dresser, MD  sucralfate (CARAFATE) 1 g tablet TAKE 1 TABLET BY MOUTH 4  TIMES DAILY 1 HOUR BEFORE  MEALS AND AT BEDTIME Patient taking differently: Take 1 g by mouth See admin instructions. Take 1 gram by mouth four times a day- ONE HOUR before meals and at bedtime 10/13/20  Yes Stacks, [name], MD  warfarin (COUMADIN) 5 MG tablet TAKE 1 TABLET BY MOUTH  EVERY MONDAY, WEDNESDAY,  AND FRIDAY AND ONE-HALF  TABLET BY MOUTH ON THE  REMAINING DAYS OF THE WEEK Patient taking differently: Take 2.5 mg by mouth daily. 04/17/21  Yes Stacks, [name], MD  cephALEXin (KEFLEX) 500 MG capsule Take 1 capsule (500 mg total) by mouth 4 (four) times daily. Patient not taking: No sig reported 05/13/21   Daleen Bo, MD  fluticasone furoate-vilanterol (BREO ELLIPTA) 200-25 MCG/INH AEPB Inhale 1 puff into the lungs daily. Patient not taking: Reported on 05/23/2021 10/13/20   Claretta Fraise, MD  loratadine (CLARITIN) 10 MG tablet Take 10 mg by mouth daily as needed (for allergies.).     [provider]    Allergies    Patient has no known allergies.  Review of Systems   Review of Systems  All other systems reviewed and are negative.  Physical Exam Updated Vital Signs BP (!) 180/56    Pulse 60   Temp 98.1 F (36.7 C) (Oral)   Resp 11   Ht  5\' 9"  (1.753 m)   Wt 85 kg   SpO2 97%   BMI 27.67 kg/m   Physical Exam Vitals and nursing note reviewed.  Constitutional:      General: He is not in acute distress.    Appearance: He is well-developed. He is not diaphoretic.  HENT:     Head: Normocephalic and atraumatic.  Cardiovascular:     Rate and Rhythm: Normal rate and regular rhythm.     Heart sounds: No murmur heard.   No friction rub.  Pulmonary:     Effort: Pulmonary effort is normal. No respiratory distress.     Breath sounds: Normal breath sounds. No wheezing or rales.  Abdominal:     General: Bowel sounds are normal. There is no distension.     Palpations: Abdomen is soft.     Tenderness: There is no abdominal tenderness.  Musculoskeletal:        General: Normal range of motion.     Cervical back: Normal range of motion and neck supple.  Skin:    General: Skin is warm and dry.  Neurological:     Mental Status: He is alert and oriented to person, place, and time.     Coordination: Coordination normal.    ED Results / Procedures / Treatments   Labs (all labs ordered are listed, but only abnormal results are displayed) Labs Reviewed  CBC WITH DIFFERENTIAL/PLATELET - Abnormal; Notable for the following components:      Result Value   RBC 2.62 (*)    Hemoglobin 8.2 (*)    HCT 25.1 (*)    Monocytes Absolute 1.1 (*)    All other components within normal limits  COMPREHENSIVE METABOLIC PANEL - Abnormal; Notable for the following components:   Sodium 130 (*)    Potassium 6.9 (*)    CO2 19 (*)    BUN 91 (*)    Creatinine, Ser 10.49 (*)    Total Protein 6.0 (*)    Albumin 3.0 (*)    AST 14 (*)    GFR, Estimated 5 (*)    All other components within normal limits  URINALYSIS, ROUTINE W REFLEX MICROSCOPIC - Abnormal; Notable for the following components:   APPearance HAZY (*)    Glucose, UA 50 (*)    Hgb urine dipstick LARGE (*)    Protein, ur 100 (*)     Leukocytes,Ua TRACE (*)    RBC / HPF >50 (*)    Bacteria, UA RARE (*)    All other components within normal limits    EKG EKG Interpretation  Date/Time:  Saturday May 23 2021 03:30:55 EDT Ventricular Rate:  60 PR Interval:    QRS Duration: 156 QT Interval:  456 QTC Calculation: 456 R Axis:   270 Text Interpretation: VENTRICULAR PACED RHYTHM Confirmed by Veryl Speak [phone]) on 05/23/2021 4:46:58 AM  Radiology No results found.  Procedures Procedures   Medications Ordered in ED Medications  sodium zirconium cyclosilicate (LOKELMA) packet 10 g (has no administration in time range)  calcium gluconate 1 g/ 50 mL sodium chloride IVPB (has no administration in time range)  insulin aspart (novoLOG) injection 10 Units (has no administration in time range)  dextrose 50 % solution 50 mL (has no administration in time range)    ED Course  I have reviewed the triage vital signs and the nursing notes.  Pertinent labs & imaging results that were available during my care of the patient were reviewed by me and considered in my  medical decision making (see chart for details).    MDM Rules/Calculators/A&P  Patient with extensive past medical history as per HPI found to have worsening renal function and hyperkalemia during his preop work-up for urologic surgery.  Patient's laboratory studies repeated here and found to have a creatinine of 10.5 and potassium of 6.9.  EKG shows a paced rhythm and vital signs are otherwise stable.  This is a significant elevation of his renal function above baseline checked just 10 days ago.  This was discussed with Dr. Hollie Salk from nephrology.  She has recommended 20 mg of Lokelma, calcium, bicarb, and glucose/insulin.  These have all been ordered and administered.  I have spoken with Dr. Josephine Cables from the hospitalist service who will admit.  Nephrology to consult in the morning.  CRITICAL CARE Performed by: Veryl Speak Total critical care time: 40  minutes Critical care time was exclusive of separately billable procedures and treating other patients. Critical care was necessary to treat or prevent imminent or life-threatening deterioration. Critical care was time spent personally by me on the following activities: development of treatment plan with patient and/or surrogate as well as nursing, discussions with consultants, evaluation of patient's response to treatment, examination of patient, obtaining history from patient or surrogate, ordering and performing treatments and interventions, ordering and review of laboratory studies, ordering and review of radiographic studies, pulse oximetry and re-evaluation of patient's condition.   Final Clinical Impression(s) / ED Diagnoses Final diagnoses:  None    Rx / DC Orders ED Discharge Orders     None        Veryl Speak, MD 05/23/21 [phone]

## 2021-05-23 NOTE — ED Notes (Signed)
Provider at bedside

## 2021-05-23 NOTE — ED Notes (Signed)
Advised by kidney provider to come to the hosp due to abnormal labs

## 2021-05-23 NOTE — ED Notes (Signed)
Breakfast Ordered 

## 2021-05-23 NOTE — Consult Note (Addendum)
[facility] Nephrology Consultation Note  Requesting MD: Dr. Reesa Chew, [place] Reason for consult:   HPI:  [name] [name] is a 80 y.o. male with medical history of hypertension, COPD, CAD status post CABG, A. fib on anticoagulation, CHF, CKD 4 who was presented to the ER for abnormal lab results seen as a consultation for the evaluation of AKI on CKD and hyperkalemia. Per patient and his wife at the bedside, they went to ER on 8/10 for frank hematuria when he was treated for urinary tract infection.  The Coumadin was held.  The patient was subsequently seen by urologist on 8/19 at [facility] urology by Dr. Claudia Desanctis.  Per patient, he was told to have no left kidney function and he supposed to have surgery on the right kidney but the patient and his wife not clear about it.  Last night he received a call from urology office to go to the ER for kidney failure.  Per patient, he was not feeling well for last week or so mainly with nausea and decreased oral intake.  He denies vomiting, chest pain, shortness of breath, headache or dizziness.  Denies diarrhea but has been dealing with constipation for long time. For CHF he is on Entresto, Aldactone and added Jardiance by his cardiologist about a month ago.  He denies any urinary complaint.  No use of NSAIDs. In the ER, the blood pressure level elevated.  He was on room air.  The labs showed serum potassium level of 6.9, BUN 91, creatinine level 10.49, hemoglobin 8.2.  He received medical treatment in the ER with bicarbonate, Lokelma, IV fluid with improvement of serum potassium level to 5.9. Per ER nursing staff, the bladder scan showed less than 200 cc of urine.  He has about 425 cc of urine output since this morning.  UA with blood and proteinuria.  The kidney ultrasound with left renal atrophy and cyst on right kidney.   Creatinine, Ser  Date/Time Value Ref Range Status  05/23/2021 05:21 AM 10.52 (H) 0.61 - 1.24 mg/dL Final  05/23/2021 12:30 AM 10.49  (H) 0.61 - 1.24 mg/dL Final  05/13/2021 01:25 PM 2.52 (H) 0.61 - 1.24 mg/dL Final  05/12/2021 02:28 PM 2.30 (H) 0.61 - 1.24 mg/dL Final   PMHx:   Past Medical History:  Diagnosis Date   AICD (automatic cardioverter/defibrillator) present 04/05/2017   Anemia    Arthritis    "hips; back" (07/09/2014)   Asthma    Atrial fibrillation (HCC)    CAD (coronary artery disease)    CHF (congestive heart failure) ([place])    Cholecystitis 11/2013   CKD (chronic kidney disease) 2015   Stage  3.    COPD (chronic obstructive pulmonary disease) (HCC)    on home oxygen, 2 liters at night10/2015   Esophageal reflux    Gallstones    Gastric antral vascular ectasia 2013   Gout    Heme positive stool    Hepatomegaly    Hiatal hernia    History of blood transfusion ~ 2012   "blood count dropped; had to get 3 units"   Hyperlipidemia    Hypothyroidism    Other and unspecified coagulation defects    Personal history of colonic polyps 05/29/2010   TUBULAR ADENOMA   Unspecified essential hypertension     Past Surgical History:  Procedure Laterality Date   CARDIAC CATHETERIZATION N/A 07/15/2016   Procedure: Right/Left Heart Cath and Coronary/Graft Angiography;  Surgeon: Belva Crome, MD;  Location: [facility];  Service: Cardiovascular;  Laterality: N/A;   cholecystomy tube  12/28/2014 - 01/06/2015   tube clogged with debris, removed and IR unable to place new tube.    COLONOSCOPY  2013   Dr. Sharlett Iles: no polyps or evidence of active bleeding   CORONARY ANGIOPLASTY WITH STENT PLACEMENT  ~ 2008; 07/08/2014   "1 + 3"   CORONARY ARTERY BYPASS GRAFT  1998   CABG X4   ESOPHAGOGASTRODUODENOSCOPY  2013   Dr. 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 Sharlett Iles: AVM at 82 and blood at 30 minutes beyond first duodenal image but not actual lesion seen. If persistent IDA, bleeding, recommend enteroscopy with ablation    HERNIA REPAIR     ICD IMPLANT N/A 04/05/2017   Procedure: ICD Implant;  Surgeon: Evans Lance, MD;  Location: [facility];  Service: Cardiovascular;  Laterality: N/A;   LEFT AND RIGHT HEART CATHETERIZATION WITH CORONARY/GRAFT ANGIOGRAM N/A 07/09/2014   Right and left heart cath, bare metal stent to SVG to RCA. Sinclair Grooms, MD;    UMBILICAL HERNIA REPAIR  [phone]    Family Hx:  Family History  Problem Relation Age of Onset   Leukemia Mother    Kidney disease Mother        kidney removed    Heart attack Brother    Heart disease Brother    Heart disease Father    Stomach cancer Sister    Stomach cancer Sister    Early death Brother    Hypertension Other        family    Colon cancer Neg Hx     Social History:  reports that he quit smoking about 24 years ago. His smoking use included cigarettes. He has a 42.00 pack-year smoking history. He has never used smokeless tobacco. He reports current alcohol use of about 1.0 standard drink per week. He reports that he does not use drugs.  Allergies: No Known Allergies  Medications: Prior to Admission medications   Medication Sig Start Date End Date Taking? Authorizing Provider  acetaminophen (TYLENOL) 500 MG tablet Take 500 mg by mouth every 6 (six) hours as needed for headache or mild pain.   Yes [provider]  albuterol (VENTOLIN HFA) 108 (90 Base) MCG/ACT inhaler USE 2 INHALATIONS BY MOUTH  EVERY 6 HOURS AS NEEDED FOR SHORTNESS OF BREATH Patient taking differently: Inhale 2 puffs into the lungs every 6 (six) hours as needed for shortness of breath. 04/17/21  Yes Stacks, [name], MD  budesonide (PULMICORT) 0.25 MG/2ML nebulizer solution USE 1 VIAL  IN  NEBULIZER TWICE  DAILY - rinse mouth after treatment Patient taking differently: Take 0.25  mg by nebulization See admin instructions. USE 1 VIAL  IN  NEBULIZER TWICE DAILY - rinse mouth after treatment 03/03/21  Yes Stacks, [name], MD  carvedilol (COREG) 6.25 MG tablet Take 1.5 tablets (9.375 mg total) by mouth 2 (two) times daily. 02/16/21  Yes Stacks, [name], MD  Cholecalciferol (VITAMIN D3) 5000 UNITS CAPS Take 5,000 Units by mouth daily.   Yes [provider]  docusate sodium (COLACE) 100 MG capsule Take 100 mg by mouth daily as needed for mild constipation.   Yes [provider]  empagliflozin (JARDIANCE) 10 MG TABS tablet  Take 1 tablet (10 mg total) by mouth daily before breakfast. 11/24/20  Yes Stacks, [name], MD  ferrous sulfate 325 (65 FE) MG tablet Take 325 mg by mouth daily with breakfast.   Yes [provider]  hydrALAZINE (APRESOLINE) 50 MG tablet TAKE 1 TABLET BY MOUTH 3  TIMES DAILY Patient taking differently: Take 50 mg by mouth 3 (three) times daily. 07/21/20  Yes Larey Dresser, MD  HYDROcodone-acetaminophen (NORCO) 5-325 MG tablet Take 2 tablets by mouth every 4 (four) hours as needed. Patient taking differently: Take 1-2 tablets by mouth every 4 (four) hours as needed for moderate pain. 05/13/21  Yes Daleen Bo, MD  Hypromell-Glycerin-Naphazoline 0.8-0.25-0.012 % SOLN Place 1-2 drops into both eyes 3 (three) times daily as needed (for dry eyes.).   Yes [provider]  ipratropium-albuterol (DUONEB) 0.5-2.5 (3) MG/3ML SOLN USE 1 VIAL IN NEBULIZER 4 TIMES DAILY Patient taking differently: Take 3 mLs by nebulization 4 (four) times daily. 03/03/21  Yes Stacks, [name], MD  levothyroxine (SYNTHROID) 112 MCG tablet TAKE 1 TABLET BY MOUTH  DAILY BEFORE BREAKFAST Patient taking differently: Take 112 mcg by mouth daily before breakfast. 10/17/20  Yes Stacks, [name], MD  lovastatin (MEVACOR) 40 MG tablet Take 2 tablets (80 mg total) by mouth at bedtime. 09/09/20  Yes Claretta Fraise, MD  Multiple Vitamins-Minerals (MULTIVITAMINS THER. W/MINERALS)  TABS Take 1 tablet by mouth daily.   Yes [provider]  nitroGLYCERIN (NITROSTAT) 0.4 MG SL tablet Place 1 tablet (0.4 mg total) under the tongue every 5 (five) minutes as needed for chest pain. 03/18/17  Yes Shirley Friar, PA-C  OXYGEN Inhale 2 L/min into the lungs See admin instructions. 2 L/min at bedtime and as needed daily as needed for shortness of breath   Yes [provider]  pantoprazole (PROTONIX) 40 MG tablet Take 1 tablet (40 mg total) by mouth 2 (two) times daily. Patient taking differently: Take 40 mg by mouth in the morning and at bedtime. 12/12/20  Yes Stacks, [name], MD  sacubitril-valsartan (ENTRESTO) 97-103 MG Take 1 tablet by mouth 2 (two) times daily. 10/23/20  Yes Larey Dresser, MD  spironolactone (ALDACTONE) 25 MG tablet Take 0.5 tablets (12.5 mg total) by mouth daily. 12/16/20  Yes Larey Dresser, MD  sucralfate (CARAFATE) 1 g tablet TAKE 1 TABLET BY MOUTH 4  TIMES DAILY 1 HOUR BEFORE  MEALS AND AT BEDTIME Patient taking differently: Take 1 g by mouth See admin instructions. Take 1 gram by mouth four times a day- ONE HOUR before meals and at bedtime 10/13/20  Yes Stacks, [name], MD  warfarin (COUMADIN) 5 MG tablet TAKE 1 TABLET BY MOUTH  EVERY MONDAY, WEDNESDAY,  AND FRIDAY AND ONE-HALF  TABLET BY MOUTH ON THE  REMAINING DAYS OF THE WEEK Patient taking differently: Take 2.5 mg by mouth daily. 04/17/21  Yes Stacks, [name], MD  cephALEXin (KEFLEX) 500 MG capsule Take 1 capsule (500 mg total) by mouth 4 (four) times daily. Patient not taking: No sig reported 05/13/21   Daleen Bo, MD  fluticasone furoate-vilanterol (BREO ELLIPTA) 200-25 MCG/INH AEPB Inhale 1 puff into the lungs daily. Patient not taking: Reported on 05/23/2021 10/13/20   Claretta Fraise, MD  loratadine (CLARITIN) 10 MG tablet Take 10 mg by mouth daily as needed (for allergies.).     [provider]    I have reviewed the patient's current medications.  Labs:  Results for  orders placed or performed during the hospital encounter of 05/22/21 (from  the past 48 hour(s))  Urinalysis, Routine w reflex microscopic Urine, Clean Catch     Status: Abnormal   Collection Time: 05/23/21 12:20 AM  Result Value Ref Range   Color, Urine YELLOW YELLOW   APPearance HAZY (A) CLEAR   Specific Gravity, Urine 1.011 1.005 - 1.030   pH 6.0 5.0 - 8.0   Glucose, UA 50 (A) NEGATIVE mg/dL   Hgb urine dipstick LARGE (A) NEGATIVE   Bilirubin Urine NEGATIVE NEGATIVE   Ketones, ur NEGATIVE NEGATIVE mg/dL   Protein, ur 100 (A) NEGATIVE mg/dL   Nitrite NEGATIVE NEGATIVE   Leukocytes,Ua TRACE (A) NEGATIVE   RBC / HPF >50 (H) 0 - 5 RBC/hpf   WBC, UA 21-50 0 - 5 WBC/hpf   Bacteria, UA RARE (A) NONE SEEN   Mucus PRESENT     Comment: Performed at [facility], [address], [place], [place] [address]  CBC with Differential     Status: Abnormal   Collection Time: 05/23/21 12:30 AM  Result Value Ref Range   WBC 10.3 4.0 - 10.5 K/uL   RBC 2.62 (L) 4.22 - 5.81 MIL/uL   Hemoglobin 8.2 (L) 13.0 - 17.0 g/dL   HCT 25.1 (L) 39.0 - 52.0 %   MCV 95.8 80.0 - 100.0 fL   MCH 31.3 26.0 - 34.0 pg   MCHC 32.7 30.0 - 36.0 g/dL   RDW 13.0 11.5 - 15.5 %   Platelets 337 150 - 400 K/uL   nRBC 0.0 0.0 - 0.2 %   Neutrophils Relative % 75 %   Neutro Abs 7.7 1.7 - 7.7 K/uL   Lymphocytes Relative 9 %   Lymphs Abs 1.0 0.7 - 4.0 K/uL   Monocytes Relative 11 %   Monocytes Absolute 1.1 (H) 0.1 - 1.0 K/uL   Eosinophils Relative 4 %   Eosinophils Absolute 0.4 0.0 - 0.5 K/uL   Basophils Relative 1 %   Basophils Absolute 0.1 0.0 - 0.1 K/uL   Immature Granulocytes 0 %   Abs Immature Granulocytes 0.04 0.00 - 0.07 K/uL    Comment: Performed at [facility] [address], [place], [place] [address]  Comprehensive metabolic panel     Status: Abnormal   Collection Time: 05/23/21 12:30 AM  Result Value Ref Range   Sodium 130 (L) 135 - 145 mmol/L   Potassium 6.9 (HH) 3.5 - 5.1 mmol/L    Comment: NO  VISIBLE HEMOLYSIS CRITICAL RESULT CALLED TO, READ BACK BY AND VERIFIED WITH: Rosana Fret, RN [phone] J. COLE, MLS    Chloride 99 98 - 111 mmol/L   CO2 19 (L) 22 - 32 mmol/L   Glucose, Bld 96 70 - 99 mg/dL    Comment: Glucose reference range applies only to samples taken after fasting for at least 8 hours.   BUN 91 (H) 8 - 23 mg/dL   Creatinine, Ser 10.49 (H) 0.61 - 1.24 mg/dL   Calcium 8.9 8.9 - 10.3 mg/dL   Total Protein 6.0 (L) 6.5 - 8.1 g/dL   Albumin 3.0 (L) 3.5 - 5.0 g/dL   AST 14 (L) 15 - 41 U/L   ALT 9 0 - 44 U/L   Alkaline Phosphatase 38 38 - 126 U/L   Total Bilirubin 0.6 0.3 - 1.2 mg/dL   GFR, Estimated 5 (L) >60 mL/min    Comment: (NOTE) Calculated using the CKD-EPI Creatinine Equation (2021)    Anion gap 12 5 - 15    Comment: Performed at  [facility], [place] [address], [place], [place] [address]  CBG monitoring, ED     Status: None   Collection Time: 05/23/21  3:32 AM  Result Value Ref Range   Glucose-Capillary 90 70 - 99 mg/dL    Comment: Glucose reference range applies only to samples taken after fasting for at least 8 hours.  CBC     Status: Abnormal   Collection Time: 05/23/21  5:21 AM  Result Value Ref Range   WBC 9.8 4.0 - 10.5 K/uL   RBC 2.39 (L) 4.22 - 5.81 MIL/uL   Hemoglobin 7.6 (L) 13.0 - 17.0 g/dL   HCT 22.7 (L) 39.0 - 52.0 %   MCV 95.0 80.0 - 100.0 fL   MCH 31.8 26.0 - 34.0 pg   MCHC 33.5 30.0 - 36.0 g/dL   RDW 12.8 11.5 - 15.5 %   Platelets 279 150 - 400 K/uL   nRBC 0.0 0.0 - 0.2 %    Comment: Performed at [facility], [place] [address] [place], [place] [address]  Comprehensive metabolic panel     Status: Abnormal   Collection Time: 05/23/21  5:21 AM  Result Value Ref Range   Sodium 136 135 - 145 mmol/L   Potassium 5.9 (H) 3.5 - 5.1 mmol/L   Chloride 104 98 - 111 mmol/L   CO2 22 22 - 32 mmol/L   Glucose, Bld 59 (L) 70 - 99 mg/dL    Comment: Glucose reference range applies only to samples taken after fasting for at least 8 hours.    BUN 94 (H) 8 - 23 mg/dL   Creatinine, Ser 10.52 (H) 0.61 - 1.24 mg/dL   Calcium 9.4 8.9 - 10.3 mg/dL   Total Protein 5.3 (L) 6.5 - 8.1 g/dL   Albumin 2.8 (L) 3.5 - 5.0 g/dL   AST 13 (L) 15 - 41 U/L   ALT 7 0 - 44 U/L   Alkaline Phosphatase 33 (L) 38 - 126 U/L   Total Bilirubin 0.6 0.3 - 1.2 mg/dL   GFR, Estimated 4 (L) >60 mL/min    Comment: (NOTE) Calculated using the CKD-EPI Creatinine Equation (2021)    Anion gap 10 5 - 15    Comment: Performed at [facility], [place] [address], [place], [place] [address]  Protime-INR     Status: Abnormal   Collection Time: 05/23/21  5:21 AM  Result Value Ref Range   Prothrombin Time 28.2 (H) 11.4 - 15.2 seconds   INR 2.7 (H) 0.8 - 1.2    Comment: (NOTE) INR goal varies based on device and disease states. Performed at [facility], [place] [address] [place], [place] [address]   APTT     Status: Abnormal   Collection Time: 05/23/21  5:21 AM  Result Value Ref Range   aPTT 61 (H) 24 - 36 seconds    Comment:        IF BASELINE aPTT IS ELEVATED, SUGGEST PATIENT RISK ASSESSMENT BE USED TO DETERMINE APPROPRIATE ANTICOAGULANT THERAPY. Performed at [facility], [place] [address], [place], [place] [address]   Magnesium     Status: None   Collection Time: 05/23/21  5:21 AM  Result Value Ref Range   Magnesium 2.3 1.7 - 2.4 mg/dL    Comment: Performed at [facility] [address] [place], [place] [address]  Phosphorus     Status: Abnormal   Collection Time: 05/23/21  5:21 AM  Result Value Ref  Range   Phosphorus 4.8 (H) 2.5 - 4.6 mg/dL    Comment: Performed at [facility] [address], [place], [place] [address]  CBG monitoring, ED     Status: Abnormal   Collection Time: 05/23/21  5:35 AM  Result Value Ref Range   Glucose-Capillary 47 (L) 70 - 99 mg/dL    Comment: Glucose reference range applies only to samples taken after fasting for at least 8 hours.   Comment 1 Notify RN    Comment 2 Document in Chart    SARS CORONAVIRUS 2 (TAT 6-24 HRS) Nasopharyngeal Nasopharyngeal Swab     Status: None   Collection Time: 05/23/21  5:46 AM   Specimen: Nasopharyngeal Swab  Result Value Ref Range   SARS Coronavirus 2 NEGATIVE NEGATIVE    Comment: (NOTE) SARS-CoV-2 target nucleic acids are NOT DETECTED.  The SARS-CoV-2 RNA is generally detectable in upper and lower respiratory specimens during the acute phase of infection. Negative results do not preclude SARS-CoV-2 infection, do not rule out co-infections with other pathogens, and should not be used as the sole basis for treatment or other patient management decisions. Negative results must be combined with clinical observations, patient history, and epidemiological information. The expected result is Negative.  Fact Sheet for Patients: SugarRoll.be  Fact Sheet for Healthcare Providers: https://www.woods-mathews.com/  This test is not yet approved or cleared by the [place] FDA and  has been authorized for detection and/or diagnosis of SARS-CoV-2 by FDA under an Emergency Use Authorization (EUA). This EUA will remain  in effect (meaning this test can be used) for the duration of the COVID-19 declaration under Se ction 564(b)(1) of the Act, 21 U.S.C. section 360bbb-3(b)(1), unless the authorization is terminated or revoked sooner.  Performed at [facility], [place] [address] [place], [place] [address]   CBG monitoring, ED     Status: None   Collection Time: 05/23/21  6:05 AM  Result Value Ref Range   Glucose-Capillary 85 70 - 99 mg/dL    Comment: Glucose reference range applies only to samples taken after fasting for at least 8 hours.   Comment 1 Notify RN    Comment 2 Document in Chart   Hemoglobin A1c     Status: None   Collection Time: 05/23/21  7:59 AM  Result Value Ref Range   Hgb A1c MFr Bld 5.4 4.8 - 5.6 %    Comment: (NOTE) Pre diabetes:          5.7%-6.4%  Diabetes:               >6.4%  Glycemic control for   <7.0% adults with diabetes    Mean Plasma Glucose 108.28 mg/dL    Comment: Performed at [facility] [address] [place], [place] [address]  CBG monitoring, ED     Status: None   Collection Time: 05/23/21  8:14 AM  Result Value Ref Range   Glucose-Capillary 93 70 - 99 mg/dL    Comment: Glucose reference range applies only to samples taken after fasting for at least 8 hours.  CBG monitoring, ED     Status: None   Collection Time: 05/23/21 12:13 PM  Result Value Ref Range   Glucose-Capillary 99 70 - 99 mg/dL    Comment: Glucose reference range applies only to samples taken after fasting for at least 8 hours.     ROS:  Pertinent items noted in HPI and remainder of comprehensive ROS otherwise negative.  Physical Exam: Vitals:   05/23/21 1200 05/23/21 1204  BP: (!) 170/55 (!) 170/55  Pulse: (!) 59   Resp: 14   Temp:    SpO2: 96%      General exam: Appears calm and comfortable  Respiratory system: Clear to auscultation. Respiratory effort normal. No wheezing or crackle Cardiovascular system: S1 & S2 heard, RRR. Trace pedal edema.  No rub Gastrointestinal system: Abdomen is nondistended, soft and nontender. Normal bowel sounds heard. Central nervous system: Alert and oriented. No focal neurological deficits.  No asterixis Extremities: Trace edema, no cyanosis or clubbing Skin: No rashes, lesions or ulcers Psychiatry: Judgement and insight appear normal. Mood & affect appropriate.   Assessment/Plan:  #Acute kidney injury on CKD stage IV likely due to ischemic ATN due to dehydration/decreased oral intake concomitant with the use of Entresto, Aldactone and Jardiance in a solitary functioning kidney. Bladder scan and kidney ultrasound without obstruction, has left renal atrophy.  UA with blood for which urology is following and also noted proteinuria.  I will order spot urine PCR to quantify the proteinuria. We will place Foley catheter for  a strict ins and out, start IV fluid.   Hold Jardiance, Entresto and Aldactone. Repeat lab in the afternoon to monitor electrolytes.  He is nonoliguric and hopefully the kidney function will start to improve gradually.  I have discussed with the patient and his wife that if there is no improvement in kidney function then he may need dialysis.  #Hyperkalemia: Due to use of Entresto, Aldactone and low GFR. Bleeding/hematuria might have also contributed.  He received medical treatment in ER.  Continue IV fluid, Lokelma.  Repeat lab in the afternoon.  #Hypertension: Holding cardiac medication because of AKI.  Agree with hydralazine.  Monitor blood pressure.  #Hematuria: Seen by urologist yesterday and sent to ER.  Reportedly he was told to need surgery on the right kidney.  Recommend to consult urologist.  #Anemia due to blood loss and chronic illness/CKD: Check iron studies.  #History of CHF: Looks euvolemic to dry on exam.  Holding cardiac medication because of AKI.  Discussed with the primary team and patient's wife. Thank you for the consult, we will follow with you.  Conall Vangorder Tanna Furry 05/23/2021, 12:37 PM  [facility].

## 2021-05-23 NOTE — Progress Notes (Signed)
80 y.o. male with medical history significant for CAD s/p CABG, atrial fibrillation, CHF, AICD, COPD, CKD 3B- IV who presents to the emergency department after being asked to go to the ED by his PCP due to abnormal lab work of AKI and hyperkalemia.  Nephrology and urology team were consulted.  Patient seen and examined at bedside this morning, does not have any complaints but he is a very poor historian.  Spoke with his daughter tells me patient developed hematuria about 10 days ago went to [facility], ER.  Basic work-up was negative therefore was referred to outpatient urology.  Patient went to his outpatient urology appointment yesterday where he was noted to be have abnormal renal function therefore sent to the hospital for further evaluation.  Vital signs are stable at this time. On his physical exam he is not in any acute distress, clear to auscultation bilaterally, trace pitting edema bilateral lower extremity, clear to auscultation bilaterally, normal sinus rhythm.  CT abdomen pelvis shows possible right-sided hydro nephrosis Renal ultrasound-left renal atrophy, no hydronephrosis present   Acute kidney injury on CKD stage IIIb -dehydration, nephrotoxic medication, malignancy, obstruction.  We will need to place Foley catheter for accurate input and output, IV fluids.  Monitor electrolytes closely.  Nephrology and urology has been consulted.  Continue to hold nephrotoxic drugs.  Hematuria-currently appears to have subsided but we will closely monitor this.  Per urology, patient will likely require cystoscopy to rule out any malignancy and even possible stent placement if there is compression noted  Rest of medical conditions are stable for now but continue to monitor.   ED RN notified me that they will not be able to place Foley catheter unless if patient is trauma or critical care.  Bladder scan does not show any acute retention at this time.  I think Foley catheter will be beneficial for  accurate input and output given significant abnormal renal function.  Case discussed with Nephro and urology  Gerlean Ren MD [facility]

## 2021-05-23 NOTE — ED Notes (Signed)
Pt CBG 47. Given 8 oz apple juice.

## 2021-05-23 NOTE — ED Notes (Signed)
Pt resting comfortably at this time. Denies complaints.

## 2021-05-23 NOTE — ED Notes (Signed)
[name] Cables, MD made aware of pts BP. MD at bedside at this time.

## 2021-05-23 NOTE — Progress Notes (Signed)
Notified by CCMD at 1600 that at approximately 1430, patient had a wide 10-beat run of vtach, but then resumed his paced rhythm. Dr. Reesa Chew made aware, patient stable at this time. Will continue to monitor.

## 2021-05-23 NOTE — ED Notes (Signed)
Verbal report given to Camryn C. RN at this time and pt will be moved

## 2021-05-23 NOTE — ED Triage Notes (Signed)
Patient received a call from his MD office advised him to go to ER due to abnormal blood tests results , patient added feeling tired/fatigue , denies pain /no SOB .

## 2021-05-23 NOTE — H&P (Signed)
History and Physical  [name] [name] ZTI:[id number] DOB: 12/22/1940 DOA: 05/22/2021  Referring physician: Danne Baxter PCP: Claretta Fraise, MD  Patient coming from: Home  Chief Complaint: Abnormal labs  HPI: [name] [name] is a 80 y.o. male with medical history significant for CAD s/p CABG, atrial fibrillation, CHF, AICD, COPD, CKD 3B- IV who presents to the emergency department after being asked to go to the ED by his PCP due to abnormal lab work.  He was recently seen in the ED by Dr. Vira Agar (urologist) due to UTI with hematuria (05/13/2021).  Apparently, patient appears to be undergoing some preop testing due to worsening kidney function, he was called to go to the ED by his PCP for further evaluation.  Patient endorsed several days of weakness, and fatigue, but denies chest pain, fever, shortness of breath, nausea, vomiting or abdominal pain.  ED Course:  In the emergency department, was elevated at 181/59, but other vital signs were within normal range.  Work-up in the ED showed normocytic anemia, hyperkalemia, BUN to creatinine 91/10.49 (baseline creatinine at 2.1-2.5), phosphorus 4.8, magnesium 2.3, albumin 2.8. CT abdomen and pelvis done on 05/13/2021 showed mild right hydronephrosis with gradual tapering of the proximal right ureter.  Calcium gluconate was given, insulin and dextrose, albuterol and Lokelma were given due to hyperkalemia.  Hydralazine was given due to hypotension.  Nephrology was consulted and will see patient in the morning per ED physician.  Hospitalist was asked to admit patient for further evaluation and management.  Review of Systems: Constitutional: Positive for fatigue.  Negative for chills and fever.  HENT: Negative for ear pain and sore throat.   Eyes: Negative for pain and visual disturbance.  Respiratory: Negative for cough, chest tightness and shortness of breath.   Cardiovascular: Negative for chest pain and palpitations.  Gastrointestinal: Negative for  abdominal pain and vomiting.  Endocrine: Negative for polyphagia and polyuria.  Genitourinary: Negative for decreased urine volume, dysuria, enuresis Musculoskeletal: Negative for arthralgias and back pain.  Skin: Negative for color change and rash.  Allergic/Immunologic: Negative for immunocompromised state.  Neurological: Positive for weakness.  Negative for tremors, syncope, speech difficulty Hematological: Does not bruise/bleed easily.  All other systems reviewed and are negative  Past Medical History:  Diagnosis Date   AICD (automatic cardioverter/defibrillator) present 04/05/2017   Anemia    Arthritis    "hips; back" (07/09/2014)   Asthma    Atrial fibrillation (HCC)    CAD (coronary artery disease)    CHF (congestive heart failure) ([place])    Cholecystitis 11/2013   CKD (chronic kidney disease) 2015   Stage  3.    COPD (chronic obstructive pulmonary disease) (HCC)    on home oxygen, 2 liters at night10/2015   Esophageal reflux    Gallstones    Gastric antral vascular ectasia 2013   Gout    Heme positive stool    Hepatomegaly    Hiatal hernia    History of blood transfusion ~ 2012   "blood count dropped; had to get 3 units"   Hyperlipidemia    Hypothyroidism    Other and unspecified coagulation defects    Personal history of colonic polyps 05/29/2010   TUBULAR ADENOMA   Unspecified essential hypertension    Past Surgical History:  Procedure Laterality Date   CARDIAC CATHETERIZATION N/A 07/15/2016   Procedure: Right/Left Heart Cath and Coronary/Graft Angiography;  Surgeon: Belva Crome, MD;  Location: [facility];  Service: Cardiovascular;  Laterality: N/A;  cholecystomy tube  12/28/2014 - 01/06/2015   tube clogged with debris, removed and IR unable to place new tube.    COLONOSCOPY  2013   Dr. Sharlett Iles: no polyps or evidence of active bleeding   CORONARY ANGIOPLASTY WITH STENT PLACEMENT  ~ 2008; 07/08/2014   "1 + 3"   CORONARY ARTERY BYPASS GRAFT  1998    CABG X4   ESOPHAGOGASTRODUODENOSCOPY  2013   Dr. 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 Sharlett Iles: AVM at 47 and blood at 30 minutes beyond first duodenal image but not actual lesion seen. If persistent IDA, bleeding, recommend enteroscopy with ablation    HERNIA REPAIR     ICD IMPLANT N/A 04/05/2017   Procedure: ICD Implant;  Surgeon: Evans Lance, MD;  Location: [facility];  Service: Cardiovascular;  Laterality: N/A;   LEFT AND RIGHT HEART CATHETERIZATION WITH CORONARY/GRAFT ANGIOGRAM N/A 07/09/2014   Right and left heart cath, bare metal stent to SVG to RCA. Sinclair Grooms, MD;    UMBILICAL HERNIA REPAIR  [phone]    Social History:  reports that he quit smoking about 24 years ago. His smoking use included cigarettes. He has a 42.00 pack-year smoking history. He has never used smokeless tobacco. He reports current alcohol use of about 1.0 standard drink per week. He reports that he does not use drugs.   No Known Allergies  Family History  Problem Relation Age of Onset   Leukemia Mother    Kidney disease Mother        kidney removed    Heart attack Brother    Heart disease Brother    Heart disease Father    Stomach cancer Sister    Stomach cancer Sister    Early death Brother    Hypertension Other        family    Colon cancer Neg Hx      Prior to Admission medications   Medication Sig Start Date End Date Taking? Authorizing Provider  acetaminophen (TYLENOL) 500 MG tablet Take 500 mg by mouth every 6 (six) hours as needed for headache or mild pain.   Yes [provider]  albuterol (VENTOLIN HFA) 108 (90 Base) MCG/ACT inhaler USE 2 INHALATIONS BY MOUTH  EVERY 6 HOURS AS NEEDED FOR SHORTNESS OF  BREATH Patient taking differently: Inhale 2 puffs into the lungs every 6 (six) hours as needed for shortness of breath. 04/17/21  Yes Stacks, [name], MD  budesonide (PULMICORT) 0.25 MG/2ML nebulizer solution USE 1 VIAL  IN  NEBULIZER TWICE  DAILY - rinse mouth after treatment Patient taking differently: Take 0.25 mg by nebulization See admin instructions. USE 1 VIAL  IN  NEBULIZER TWICE DAILY - rinse mouth after treatment 03/03/21  Yes Stacks, [name], MD  carvedilol (COREG) 6.25 MG tablet Take 1.5 tablets (9.375 mg total) by mouth 2 (two) times daily. 02/16/21  Yes Stacks, [name], MD  Cholecalciferol (VITAMIN D3) 5000 UNITS CAPS Take 5,000 Units by mouth daily.   Yes [provider]  docusate sodium (COLACE) 100 MG capsule Take 100 mg by mouth daily as needed for mild constipation.   Yes [provider]  empagliflozin (JARDIANCE) 10 MG TABS tablet Take 1 tablet (10 mg total) by mouth daily before breakfast. 11/24/20  Yes Claretta Fraise, MD  ferrous sulfate 325 (65 FE) MG tablet Take 325 mg by mouth daily with breakfast.   Yes [provider]  hydrALAZINE (APRESOLINE) 50 MG tablet TAKE 1 TABLET BY MOUTH 3  TIMES DAILY Patient taking differently: Take 50 mg by mouth 3 (three) times daily. 07/21/20  Yes Larey Dresser, MD  HYDROcodone-acetaminophen (NORCO) 5-325 MG tablet Take 2 tablets by mouth every 4 (four) hours as needed. Patient taking differently: Take 1-2 tablets by mouth every 4 (four) hours as needed for moderate pain. 05/13/21  Yes Daleen Bo, MD  Hypromell-Glycerin-Naphazoline 0.8-0.25-0.012 % SOLN Place 1-2 drops into both eyes 3 (three) times daily as needed (for dry eyes.).   Yes [provider]  ipratropium-albuterol (DUONEB) 0.5-2.5 (3) MG/3ML SOLN USE 1 VIAL IN NEBULIZER 4 TIMES DAILY Patient taking differently: Take 3 mLs by nebulization 4 (four) times daily. 03/03/21  Yes Stacks, [name], MD  levothyroxine (SYNTHROID) 112 MCG tablet TAKE 1 TABLET BY  MOUTH  DAILY BEFORE BREAKFAST Patient taking differently: Take 112 mcg by mouth daily before breakfast. 10/17/20  Yes Stacks, [name], MD  lovastatin (MEVACOR) 40 MG tablet Take 2 tablets (80 mg total) by mouth at bedtime. 09/09/20  Yes Claretta Fraise, MD  Multiple Vitamins-Minerals (MULTIVITAMINS THER. W/MINERALS) TABS Take 1 tablet by mouth daily.   Yes [provider]  nitroGLYCERIN (NITROSTAT) 0.4 MG SL tablet Place 1 tablet (0.4 mg total) under the tongue every 5 (five) minutes as needed for chest pain. 03/18/17  Yes Shirley Friar, PA-C  OXYGEN Inhale 2 L/min into the lungs See admin instructions. 2 L/min at bedtime and as needed daily as needed for shortness of breath   Yes [provider]  pantoprazole (PROTONIX) 40 MG tablet Take 1 tablet (40 mg total) by mouth 2 (two) times daily. Patient taking differently: Take 40 mg by mouth in the morning and at bedtime. 12/12/20  Yes Stacks, [name], MD  sacubitril-valsartan (ENTRESTO) 97-103 MG Take 1 tablet by mouth 2 (two) times daily. 10/23/20  Yes Larey Dresser, MD  spironolactone (ALDACTONE) 25 MG tablet Take 0.5 tablets (12.5 mg total) by mouth daily. 12/16/20  Yes Larey Dresser, MD  sucralfate (CARAFATE) 1 g tablet TAKE 1 TABLET BY MOUTH 4  TIMES DAILY 1 HOUR BEFORE  MEALS AND AT BEDTIME Patient taking differently: Take 1 g by mouth See admin instructions. Take 1 gram by mouth four times a day- ONE HOUR before meals and at bedtime 10/13/20  Yes Stacks, [name], MD  warfarin (COUMADIN) 5 MG tablet TAKE 1 TABLET BY MOUTH  EVERY MONDAY, WEDNESDAY,  AND FRIDAY AND ONE-HALF  TABLET BY MOUTH ON THE  REMAINING DAYS OF THE WEEK Patient taking differently: Take 2.5 mg by mouth daily. 04/17/21  Yes Stacks, [name], MD  cephALEXin (KEFLEX) 500 MG capsule Take 1 capsule (500 mg total) by mouth 4 (four) times daily. Patient not taking: No sig reported 05/13/21   Daleen Bo, MD  fluticasone furoate-vilanterol (BREO ELLIPTA) 200-25  MCG/INH AEPB Inhale 1 puff into the lungs daily. Patient not taking: Reported on 05/23/2021 10/13/20   Claretta Fraise, MD  loratadine (CLARITIN) 10 MG tablet Take 10 mg by mouth daily as needed (for allergies.).     [provider]    Physical Exam: BP (!) 179/53   Pulse (!) 59   Temp 98.1 F (36.7 C) (Oral)   Resp 11   Ht 5\' 9"  (1.753 m)   Wt 85 kg  SpO2 95%   BMI 27.67 kg/m   General: 80 y.o. year-old male well developed well nourished in no acute distress.  Alert and oriented x3. HEENT: NCAT, EOMI Neck: Supple, trachea medial Cardiovascular: AICD insertion site noted in the left anterior chest. Regular rate and rhythm with no rubs or gallops.  No thyromegaly or JVD noted.  2/4 pulses in all 4 extremities. Respiratory: Clear to auscultation with no wheezes or rales. Good inspiratory effort. Abdomen: Soft, nontender nondistended with normal bowel sounds x4 quadrants. Muskuloskeletal: Bilateral ankle edema.  No cyanosis or clubbing noted bilaterally Neuro: CN II-XII intact, strength 5/5 x 4, sensation, reflexes intact Skin: No ulcerative lesions noted or rashes Psychiatry: Mood is appropriate for condition and setting          Labs on Admission:  Basic Metabolic Panel: Recent Labs  Lab 05/23/21 0030 05/23/21 0521  NA 130* 136  K 6.9* 5.9*  CL 99 104  CO2 19* 22  GLUCOSE 96 59*  BUN 91* 94*  CREATININE 10.49* 10.52*  CALCIUM 8.9 9.4  MG  --  2.3  PHOS  --  4.8*   Liver Function Tests: Recent Labs  Lab 05/23/21 0030 05/23/21 0521  AST 14* 13*  ALT 9 7  ALKPHOS 38 33*  BILITOT 0.6 0.6  PROT 6.0* 5.3*  ALBUMIN 3.0* 2.8*   No results for input(s): LIPASE, AMYLASE in the last 168 hours. No results for input(s): AMMONIA in the last 168 hours. CBC: Recent Labs  Lab 05/23/21 0030 05/23/21 0521  WBC 10.3 9.8  NEUTROABS 7.7  --   HGB 8.2* 7.6*  HCT 25.1* 22.7*  MCV 95.8 95.0  PLT 337 279   Cardiac Enzymes: No results for input(s): CKTOTAL, CKMB,  CKMBINDEX, TROPONINI in the last 168 hours.  BNP (last 3 results) No results for input(s): BNP in the last 8760 hours.  ProBNP (last 3 results) No results for input(s): PROBNP in the last 8760 hours.  CBG: Recent Labs  Lab 05/23/21 0332 05/23/21 0535 05/23/21 0605  GLUCAP 90 47* 85    Radiological Exams on Admission: No results found.  EKG: I independently viewed the EKG done and my findings are as followed: EKG shows ventricular paced rhythm  Assessment/Plan Present on Admission:  Hyperkalemia  Coronary atherosclerosis of native coronary artery  Essential hypertension  Hyperlipemia  Chronic systolic CHF (congestive heart failure) (HCC)  Hypothyroidism  ICD (implantable cardioverter-defibrillator) in place  Asthma with COPD ([place])  Principal Problem:   Acute kidney injury superimposed on CKD ([place]) Active Problems:   Essential hypertension   Normocytic anemia   Chronic systolic CHF (congestive heart failure) (HCC)   Asthma with COPD ([place])   Hyperlipemia   Coronary atherosclerosis of native coronary artery   Hypothyroidism   ICD (implantable cardioverter-defibrillator) in place   Hyperkalemia   Hyperphosphatemia   Hyponatremia   Hypoalbuminemia   Hypoglycemia due to insulin  Acute kidney injury on CKD 5 BUN to creatinine 91/10.49 (baseline creatinine at 2.1-2.5) Creatinine was 2.5, 10 days ago Renally adjust medications, avoid nephrotoxic agents/dehydration/hypotension Nephrology was consulted and will see patient in the morning  Hyperkalemia K+ was 6.9 IV calcium gluconate was given IV insulin and dextrose were given Albuterol and Lokelma were given Continue to monitor potassium level  Hyperphosphatemia Phosphorus at 4.8 This is possibly secondary to patient's kidney status Nephrology was consulted and will see patient in the morning  Hyponatremia Na 130, continue IV hydration Continue to monitor sodium levels with morning labs  CAD s/p  AICD Stable, last device check was on 6/30, leads and battery was reported to be stable Continue statin and Coreg Patient was not on aspirin per med rec Patient follows with vascular surgery due to carotid artery stenosis  Hypoalbuminemia possibly secondary to moderate protein calorie malnutrition Albumin 2.8, protein supplement to be provided  Hypoglycemia Blood glucose level was 59 after patient was treated with insulin and dextrose due to hyperkalemia Continue CBG and correct hypoglycemia per protocol  Normocytic anemia This is possibly secondary to patient's history of CKD Hemoglobin at 7.6, this was 8.4 about 2 days ago Patient had recent UTI with hematuria, but no obvious hematuria or any source of bleeding at this time Continue to monitor hemoglobin with morning labs  CHF Continue Coreg Entresto and spironolactone temporarily held due to per kalemia  Hypothyroidism Continue Synthroid  Asthma with COPD Continue DuoNeb, Pulmicort, albuterol  GERD Continue Protonix  Essential hypertension Continue Coreg, hydralazine  Hyperlipidemia Continue  T2DM Continue ISS and hypoglycemia protocol Empagliflozin will be temporarily held at this time   DVT prophylaxis: Heparin  Code Status: Full code  Family Communication: None at bedside  Disposition Plan:  Patient is from:                        home Anticipated DC to:                   SNF or family members home Anticipated DC date:               2-3 days Anticipated DC barriers:         patient requires inpatient management due to acute kidney injury on CKD and pending nephrology consult  Consults called: Nephrology  Admission status: Inpatient    Bernadette Hoit MD [facility]  05/23/2021, 7:56 AM

## 2021-05-23 NOTE — ED Notes (Signed)
Patient transported to [facility] 

## 2021-05-23 NOTE — Consult Note (Signed)
Reason for Consult: Acute on Chronic Renal Failure / Renal Artery Stenosis / Atrophic Left Kidney, Right Hydronephrosis + Hematuria,   Referring Physician: Gerlean Ren MD  [name] [name] is an 80 y.o. male.   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 CT 05/2021 with nearly atrophic left kidney, bilateral renal artery stenosis and some likelyk mild right hydro / blood products in right renal pelvis (?underlying malignancy). Renal US at presentation w/o sig hydro, no retention, preserved ureteral jets.   2 - Rule Out Right Renal Pelvis Mass + Hematuria - former smoker. New hematuria 2022, he is on coumadin. Non-on CT with R renal pelvis likely blood products, no stones.   PMH sig for obesity, COPD/Home O2, CAD/CABG/AICD/AFIB/COumadin, PAD.   Today "[name] [name]" is seen in consultation for above. I spoke to his wife at 11p last night and encouraged emergent ER eval which confirmd acute renal failure with hyperkalemia.   Past Medical History:  Diagnosis Date   AICD (automatic cardioverter/defibrillator) present 04/05/2017   Anemia    Arthritis    "hips; back" (07/09/2014)   Asthma    Atrial fibrillation (HCC)    CAD (coronary artery disease)    CHF (congestive heart failure) ([place])    Cholecystitis 11/2013   CKD (chronic kidney disease) 2015   Stage  3.    COPD (chronic obstructive pulmonary disease) (HCC)    on home oxygen, 2 liters at night10/2015   Esophageal reflux    Gallstones    Gastric antral vascular ectasia 2013   Gout    Heme positive stool    Hepatomegaly    Hiatal hernia    History of blood transfusion ~ 2012   "blood count dropped; had to get 3 units"   Hyperlipidemia    Hypothyroidism    Other and unspecified coagulation defects    Personal history of colonic polyps 05/29/2010   TUBULAR ADENOMA   Unspecified  essential hypertension     Past Surgical History:  Procedure Laterality Date   CARDIAC CATHETERIZATION N/A 07/15/2016   Procedure: Right/Left Heart Cath and Coronary/Graft Angiography;  Surgeon: Belva Crome, MD;  Location: [facility];  Service: Cardiovascular;  Laterality: N/A;   cholecystomy tube  12/28/2014 - 01/06/2015   tube clogged with debris, removed and IR unable to place new tube.    COLONOSCOPY  2013   Dr. Sharlett Iles: no polyps or evidence of active bleeding   CORONARY ANGIOPLASTY WITH STENT PLACEMENT  ~ 2008; 07/08/2014   "1 + 3"   CORONARY ARTERY BYPASS GRAFT  1998   CABG X4   ESOPHAGOGASTRODUODENOSCOPY  2013   Dr. 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 Sharlett Iles: AVM at 57 and blood at 30 minutes beyond first duodenal image but not actual lesion seen. If persistent IDA, bleeding, recommend enteroscopy with ablation    HERNIA REPAIR     ICD IMPLANT N/A 04/05/2017   Procedure: ICD Implant;  Surgeon: Evans Lance, MD;  Location: [facility];  Service: Cardiovascular;  Laterality: N/A;   LEFT AND  RIGHT HEART CATHETERIZATION WITH CORONARY/GRAFT ANGIOGRAM N/A 07/09/2014   Right and left heart cath, bare metal stent to SVG to RCA. Sinclair Grooms, MD;    UMBILICAL HERNIA REPAIR  [phone]    Family History  Problem Relation Age of Onset   Leukemia Mother    Kidney disease Mother        kidney removed    Heart attack Brother    Heart disease Brother    Heart disease Father    Stomach cancer Sister    Stomach cancer Sister    Early death Brother    Hypertension Other        family    Colon cancer Neg Hx     Social History:  reports that he quit smoking about 24 years ago. His smoking use included  cigarettes. He has a 42.00 pack-year smoking history. He has never used smokeless tobacco. He reports current alcohol use of about 1.0 standard drink per week. He reports that he does not use drugs.  Allergies: No Known Allergies  Medications: I have reviewed the patient's current medications.  Results for orders placed or performed during the hospital encounter of 05/22/21 (from the past 48 hour(s))  Urinalysis, Routine w reflex microscopic Urine, Clean Catch     Status: Abnormal   Collection Time: 05/23/21 12:20 AM  Result Value Ref Range   Color, Urine YELLOW YELLOW   APPearance HAZY (A) CLEAR   Specific Gravity, Urine 1.011 1.005 - 1.030   pH 6.0 5.0 - 8.0   Glucose, UA 50 (A) NEGATIVE mg/dL   Hgb urine dipstick LARGE (A) NEGATIVE   Bilirubin Urine NEGATIVE NEGATIVE   Ketones, ur NEGATIVE NEGATIVE mg/dL   Protein, ur 100 (A) NEGATIVE mg/dL   Nitrite NEGATIVE NEGATIVE   Leukocytes,Ua TRACE (A) NEGATIVE   RBC / HPF >50 (H) 0 - 5 RBC/hpf   WBC, UA 21-50 0 - 5 WBC/hpf   Bacteria, UA RARE (A) NONE SEEN   Mucus PRESENT     Comment: Performed at [facility], [address] [place], [place] [address]  CBC with Differential     Status: Abnormal   Collection Time: 05/23/21 12:30 AM  Result Value Ref Range   WBC 10.3 4.0 - 10.5 K/uL   RBC 2.62 (L) 4.22 - 5.81 MIL/uL   Hemoglobin 8.2 (L) 13.0 - 17.0 g/dL   HCT 25.1 (L) 39.0 - 52.0 %   MCV 95.8 80.0 - 100.0 fL   MCH 31.3 26.0 - 34.0 pg   MCHC 32.7 30.0 - 36.0 g/dL   RDW 13.0 11.5 - 15.5 %   Platelets 337 150 - 400 K/uL   nRBC 0.0 0.0 - 0.2 %   Neutrophils Relative % 75 %   Neutro Abs 7.7 1.7 - 7.7 K/uL   Lymphocytes Relative 9 %   Lymphs Abs 1.0 0.7 - 4.0 K/uL   Monocytes Relative 11 %   Monocytes Absolute 1.1 (H) 0.1 - 1.0 K/uL   Eosinophils Relative 4 %   Eosinophils Absolute 0.4 0.0 - 0.5 K/uL   Basophils Relative 1 %   Basophils Absolute 0.1 0.0 - 0.1 K/uL   Immature Granulocytes 0 %   Abs Immature Granulocytes  0.04 0.00 - 0.07 K/uL    Comment: Performed at [facility] [address], [place], [place] [address]  Comprehensive metabolic panel     Status: Abnormal   Collection Time: 05/23/21 12:30 AM  Result Value Ref Range  Sodium 130 (L) 135 - 145 mmol/L   Potassium 6.9 (HH) 3.5 - 5.1 mmol/L    Comment: NO VISIBLE HEMOLYSIS CRITICAL RESULT CALLED TO, READ BACK BY AND VERIFIED WITH: Rosana Fret, RN [phone] J. COLE, MLS    Chloride 99 98 - 111 mmol/L   CO2 19 (L) 22 - 32 mmol/L   Glucose, Bld 96 70 - 99 mg/dL    Comment: Glucose reference range applies only to samples taken after fasting for at least 8 hours.   BUN 91 (H) 8 - 23 mg/dL   Creatinine, Ser 10.49 (H) 0.61 - 1.24 mg/dL   Calcium 8.9 8.9 - 10.3 mg/dL   Total Protein 6.0 (L) 6.5 - 8.1 g/dL   Albumin 3.0 (L) 3.5 - 5.0 g/dL   AST 14 (L) 15 - 41 U/L   ALT 9 0 - 44 U/L   Alkaline Phosphatase 38 38 - 126 U/L   Total Bilirubin 0.6 0.3 - 1.2 mg/dL   GFR, Estimated 5 (L) >60 mL/min    Comment: (NOTE) Calculated using the CKD-EPI Creatinine Equation (2021)    Anion gap 12 5 - 15    Comment: Performed at [facility], [place] [address] [place], [place] [address]  CBG monitoring, ED     Status: None   Collection Time: 05/23/21  3:32 AM  Result Value Ref Range   Glucose-Capillary 90 70 - 99 mg/dL    Comment: Glucose reference range applies only to samples taken after fasting for at least 8 hours.  CBC     Status: Abnormal   Collection Time: 05/23/21  5:21 AM  Result Value Ref Range   WBC 9.8 4.0 - 10.5 K/uL   RBC 2.39 (L) 4.22 - 5.81 MIL/uL   Hemoglobin 7.6 (L) 13.0 - 17.0 g/dL   HCT 22.7 (L) 39.0 - 52.0 %   MCV 95.0 80.0 - 100.0 fL   MCH 31.8 26.0 - 34.0 pg   MCHC 33.5 30.0 - 36.0 g/dL   RDW 12.8 11.5 - 15.5 %   Platelets 279 150 - 400 K/uL   nRBC 0.0 0.0 - 0.2 %    Comment: Performed at [facility], [place] [address], [place], [place] [address]  Comprehensive metabolic panel     Status: Abnormal   Collection  Time: 05/23/21  5:21 AM  Result Value Ref Range   Sodium 136 135 - 145 mmol/L   Potassium 5.9 (H) 3.5 - 5.1 mmol/L   Chloride 104 98 - 111 mmol/L   CO2 22 22 - 32 mmol/L   Glucose, Bld 59 (L) 70 - 99 mg/dL    Comment: Glucose reference range applies only to samples taken after fasting for at least 8 hours.   BUN 94 (H) 8 - 23 mg/dL   Creatinine, Ser 10.52 (H) 0.61 - 1.24 mg/dL   Calcium 9.4 8.9 - 10.3 mg/dL   Total Protein 5.3 (L) 6.5 - 8.1 g/dL   Albumin 2.8 (L) 3.5 - 5.0 g/dL   AST 13 (L) 15 - 41 U/L   ALT 7 0 - 44 U/L   Alkaline Phosphatase 33 (L) 38 - 126 U/L   Total Bilirubin 0.6 0.3 - 1.2 mg/dL   GFR, Estimated 4 (L) >60 mL/min    Comment: (NOTE) Calculated using the CKD-EPI Creatinine Equation (2021)    Anion gap 10 5 - 15    Comment: Performed at [facility], [place] [address], [place], [place] [address]  Protime-INR     Status: Abnormal   Collection Time: 05/23/21  5:21 AM  Result Value Ref Range   Prothrombin Time 28.2 (H) 11.4 - 15.2 seconds   INR 2.7 (H) 0.8 - 1.2    Comment: (NOTE) INR goal varies based on device and disease states. Performed at [facility], [place] [address], [place], [place] [address]   APTT     Status: Abnormal   Collection Time: 05/23/21  5:21 AM  Result Value Ref Range   aPTT 61 (H) 24 - 36 seconds    Comment:        IF BASELINE aPTT IS ELEVATED, SUGGEST PATIENT RISK ASSESSMENT BE USED TO DETERMINE APPROPRIATE ANTICOAGULANT THERAPY. Performed at [facility], [place] [address] [place], [place] [address]   Magnesium     Status: None   Collection Time: 05/23/21  5:21 AM  Result Value Ref Range   Magnesium 2.3 1.7 - 2.4 mg/dL    Comment: Performed at [facility] [address] [place], [place] [address]  Phosphorus     Status: Abnormal   Collection Time: 05/23/21  5:21 AM  Result Value Ref Range   Phosphorus 4.8 (H) 2.5 - 4.6 mg/dL    Comment: Performed at [facility] [address] [place], [place]  [address]  CBG monitoring, ED     Status: Abnormal   Collection Time: 05/23/21  5:35 AM  Result Value Ref Range   Glucose-Capillary 47 (L) 70 - 99 mg/dL    Comment: Glucose reference range applies only to samples taken after fasting for at least 8 hours.   Comment 1 Notify RN    Comment 2 Document in Chart   SARS CORONAVIRUS 2 (TAT 6-24 HRS) Nasopharyngeal Nasopharyngeal Swab     Status: None   Collection Time: 05/23/21  5:46 AM   Specimen: Nasopharyngeal Swab  Result Value Ref Range   SARS Coronavirus 2 NEGATIVE NEGATIVE    Comment: (NOTE) SARS-CoV-2 target nucleic acids are NOT DETECTED.  The SARS-CoV-2 RNA is generally detectable in upper and lower respiratory specimens during the acute phase of infection. Negative results do not preclude SARS-CoV-2 infection, do not rule out co-infections with other pathogens, and should not be used as the sole basis for treatment or other patient management decisions. Negative results must be combined with clinical observations, patient history, and epidemiological information. The expected result is Negative.  Fact Sheet for Patients: SugarRoll.be  Fact Sheet for Healthcare Providers: https://www.woods-mathews.com/  This test is not yet approved or cleared by the [place] FDA and  has been authorized for detection and/or diagnosis of SARS-CoV-2 by FDA under an Emergency Use Authorization (EUA). This EUA will remain  in effect (meaning this test can be used) for the duration of the COVID-19 declaration under Se ction 564(b)(1) of the Act, 21 U.S.C. section 360bbb-3(b)(1), unless the authorization is terminated or revoked sooner.  Performed at [facility], [place] [address] [place],  [address]   CBG monitoring, ED     Status: None   Collection Time: 05/23/21  6:05 AM  Result Value Ref Range   Glucose-Capillary 85 70 - 99 mg/dL    Comment: Glucose reference range applies only to  samples taken after fasting for at least 8 hours.   Comment 1 Notify RN    Comment 2 Document in Chart   Hemoglobin A1c     Status: None   Collection Time: 05/23/21  7:59  AM  Result Value Ref Range   Hgb A1c MFr Bld 5.4 4.8 - 5.6 %    Comment: (NOTE) Pre diabetes:          5.7%-6.4%  Diabetes:              >6.4%  Glycemic control for   <7.0% adults with diabetes    Mean Plasma Glucose 108.28 mg/dL    Comment: Performed at [facility] [address] [place], [place] [address]  CBG monitoring, ED     Status: None   Collection Time: 05/23/21  8:14 AM  Result Value Ref Range   Glucose-Capillary 93 70 - 99 mg/dL    Comment: Glucose reference range applies only to samples taken after fasting for at least 8 hours.  CBG monitoring, ED     Status: None   Collection Time: 05/23/21 12:13 PM  Result Value Ref Range   Glucose-Capillary 99 70 - 99 mg/dL    Comment: Glucose reference range applies only to samples taken after fasting for at least 8 hours.    US RENAL  Result Date: 05/23/2021 CLINICAL DATA:  "Bladder neck obstruction". EXAM: RENAL / URINARY TRACT ULTRASOUND COMPLETE COMPARISON:  05/13/2021 CT FINDINGS: Right Kidney: Renal measurements: 11.0 by 6.6 x 5.6 cm = volume: 211 mL. No hydronephrosis. A lower pole 1.6 cm markedly hypoechoic to anechoic lesion. Left Kidney: Renal measurements: 8.0 x 3.1 x 2.9 cm = volume: 38.1 mL. Moderately atrophic. No hydronephrosis. Bladder: Appears normal for degree of bladder distention. Other: None. IMPRESSION: 1. Left renal atrophy. 2. Lower pole right renal cyst or minimally complex cyst. Electronically Signed   By: Abigail Miyamoto M.D.   On: 05/23/2021 08:32    Review of Systems  Constitutional:  Positive for fatigue.  Neurological:  Positive for weakness.  All other systems reviewed and are negative. Blood pressure (!) 162/50, pulse (!) 59, temperature 98.1 F (36.7 C), temperature source Oral, resp. rate 12, height 5\' 9"  (1.753 m),  weight 85 kg, SpO2 97 %. Physical Exam Vitals reviewed.  Constitutional:      Comments: Ill appearing in hospital bed. Wife and daughter at bedside who are very helpful with history.   HENT:     Head: Normocephalic.     Mouth/Throat:     Mouth: Mucous membranes are moist.  Eyes:     Pupils: Pupils are equal, round, and reactive to light.  Pulmonary:     Effort: Pulmonary effort is normal.  Genitourinary:    Comments: No CVAT at present.  Musculoskeletal:     Cervical back: Normal range of motion.     Comments: LE sarcopenia, no sig edema.   Skin:    General: Skin is warm.  Neurological:     General: No focal deficit present.    Assessment/Plan:  1 - Acute on Chronic Renal Failure / Bilateral Renal Artery Stenosis / Atrophic Left Kidney - multifactorial. Agree with nephrology medical management of hyperkalemia, gentle re-hydration. Slight right renal obstruction from clot / blood products may be contributing some, but felt not to be dominant etiology.   2 - Rule Out Right Renal Pelvis Mass + Hematuria - pt would ultimately benefit form cysto, bilatateral retrogrades, right diagnostic ureteroscopy / possible biopsy to r/o right renal pelvis neoplasm in elective setting.   Will follow.   Alexis Frock 05/23/2021, 1:50 PM

## 2021-05-23 NOTE — ED Notes (Signed)
Placed call to pt's wife for pt on pocket phone, no phone in room.

## 2021-05-24 ENCOUNTER — Encounter (HOSPITAL_COMMUNITY): Payer: Self-pay | Admitting: Internal Medicine

## 2021-05-24 DIAGNOSIS — N179 Acute kidney failure, unspecified: Secondary | ICD-10-CM | POA: Diagnosis not present

## 2021-05-24 DIAGNOSIS — N189 Chronic kidney disease, unspecified: Secondary | ICD-10-CM | POA: Diagnosis not present

## 2021-05-24 LAB — BASIC METABOLIC PANEL
Anion gap: 9 (ref 5–15)
Anion gap: 10 (ref 5–15)
BUN: 89 mg/dL — ABNORMAL HIGH (ref 8–23)
BUN: 91 mg/dL — ABNORMAL HIGH (ref 8–23)
CO2: 21 mmol/L — ABNORMAL LOW (ref 22–32)
CO2: 19 mmol/L — ABNORMAL LOW (ref 22–32)
Calcium: 8.4 mg/dL — ABNORMAL LOW (ref 8.9–10.3)
Calcium: 8.2 mg/dL — ABNORMAL LOW (ref 8.9–10.3)
Chloride: 106 mmol/L (ref 98–111)
Chloride: 108 mmol/L (ref 98–111)
Creatinine, Ser: 9.52 mg/dL — ABNORMAL HIGH (ref 0.61–1.24)
Creatinine, Ser: 9.21 mg/dL — ABNORMAL HIGH (ref 0.61–1.24)
GFR, Estimated: 5 mL/min — ABNORMAL LOW (ref >60)
GFR, Estimated: 5 mL/min — ABNORMAL LOW (ref >60)
Glucose, Bld: 89 mg/dL (ref 70–99)
Glucose, Bld: 130 mg/dL — ABNORMAL HIGH (ref 70–99)
Potassium: 6.2 mmol/L — ABNORMAL HIGH (ref 3.5–5.1)
Potassium: 5.8 mmol/L — ABNORMAL HIGH (ref 3.5–5.1)
Sodium: 136 mmol/L (ref 135–145)
Sodium: 137 mmol/L (ref 135–145)

## 2021-05-24 LAB — IRON AND TIBC
Iron: 44 ug/dL — ABNORMAL LOW (ref 45–182)
Saturation Ratios: 23 % (ref 17.9–39.5)
TIBC: 189 ug/dL — ABNORMAL LOW (ref 250–450)
UIBC: 145 ug/dL

## 2021-05-24 LAB — RENAL FUNCTION PANEL
Albumin: 2.5 g/dL — ABNORMAL LOW (ref 3.5–5.0)
Anion gap: 9 (ref 5–15)
BUN: 90 mg/dL — ABNORMAL HIGH (ref 8–23)
CO2: 20 mmol/L — ABNORMAL LOW (ref 22–32)
Calcium: 8.4 mg/dL — ABNORMAL LOW (ref 8.9–10.3)
Chloride: 106 mmol/L (ref 98–111)
Creatinine, Ser: 9.6 mg/dL — ABNORMAL HIGH (ref 0.61–1.24)
GFR, Estimated: 5 mL/min — ABNORMAL LOW (ref >60)
Glucose, Bld: 81 mg/dL (ref 70–99)
Phosphorus: 5.3 mg/dL — ABNORMAL HIGH (ref 2.5–4.6)
Potassium: 6.2 mmol/L — ABNORMAL HIGH (ref 3.5–5.1)
Sodium: 135 mmol/L (ref 135–145)

## 2021-05-24 LAB — PROTIME-INR
INR: 2.6 — ABNORMAL HIGH (ref 0.8–1.2)
Prothrombin Time: 28 seconds — ABNORMAL HIGH (ref 11.4–15.2)

## 2021-05-24 LAB — CBC
HCT: 20.5 % — ABNORMAL LOW (ref 39.0–52.0)
Hemoglobin: 7 g/dL — ABNORMAL LOW (ref 13.0–17.0)
MCH: 32.1 pg (ref 26.0–34.0)
MCHC: 34.1 g/dL (ref 30.0–36.0)
MCV: 94 fL (ref 80.0–100.0)
Platelets: 264 10*3/uL (ref 150–400)
RBC: 2.18 MIL/uL — ABNORMAL LOW (ref 4.22–5.81)
RDW: 13 % (ref 11.5–15.5)
WBC: 9.1 10*3/uL (ref 4.0–10.5)
nRBC: 0 % (ref 0.0–0.2)

## 2021-05-24 LAB — GLUCOSE, CAPILLARY
Glucose-Capillary: 80 mg/dL (ref 70–99)
Glucose-Capillary: 101 mg/dL — ABNORMAL HIGH (ref 70–99)
Glucose-Capillary: 101 mg/dL — ABNORMAL HIGH (ref 70–99)
Glucose-Capillary: 99 mg/dL (ref 70–99)

## 2021-05-24 LAB — MAGNESIUM: Magnesium: 2.1 mg/dL (ref 1.7–2.4)

## 2021-05-24 LAB — PHOSPHORUS: Phosphorus: 5.3 mg/dL — ABNORMAL HIGH (ref 2.5–4.6)

## 2021-05-24 LAB — FERRITIN: Ferritin: 129 ng/mL (ref 24–336)

## 2021-05-24 MED ORDER — FUROSEMIDE 10 MG/ML IJ SOLN
40.0000 mg | Freq: Once | INTRAMUSCULAR | Status: AC
  Administered 2021-05-24: 40 mg via INTRAVENOUS
  Filled 2021-05-24: qty 4

## 2021-05-24 MED ORDER — SODIUM CHLORIDE 0.9 % IV SOLN
250.0000 mg | Freq: Every day | INTRAVENOUS | Status: AC
  Administered 2021-05-24 – 2021-05-25 (×2): 250 mg via INTRAVENOUS
  Filled 2021-05-24 (×2): qty 20

## 2021-05-24 MED ORDER — IPRATROPIUM-ALBUTEROL 0.5-2.5 (3) MG/3ML IN SOLN
3.0000 mL | Freq: Three times a day (TID) | RESPIRATORY_TRACT | Status: DC
  Administered 2021-05-24 – 2021-05-27 (×8): 3 mL via RESPIRATORY_TRACT
  Filled 2021-05-24 (×8): qty 3

## 2021-05-24 NOTE — Progress Notes (Addendum)
PROGRESS NOTE    [name] [name]  BMW:[id number] DOB: [birth year] DOA: 05/22/2021 PCP: Claretta Fraise, MD   Brief Narrative:  This 80 years old male with PMH significant for CAD s/p CABG, atrial fibrillation, CHF, AICD, COPD, CKD stage IIIb, presented in the ED after he was sent from PCP to go to the ED for abnormal labs.  He was found to have hyperkalemia and AKI.  CT abdomen and pelvis showed mild right hydronephrosis, Nephrology and urology was consulted.  Patient was given calcium gluconate, insulin with dextrose, albuterol and Lokelma for hyperkalemia.  May require cystoscopy,  bilateral retrograde ureteroscopy or possible biopsy to rule out neoplasm.  Assessment & Plan:   Principal Problem:   Acute kidney injury superimposed on CKD ([place]) Active Problems:   Essential hypertension   Normocytic anemia   Chronic systolic CHF (congestive heart failure) (HCC)   Asthma with COPD ([place])   Hyperlipemia   Coronary atherosclerosis of native coronary artery   Hypothyroidism   ICD (implantable cardioverter-defibrillator) in place   Hyperkalemia   Hyperphosphatemia   Hyponatremia   Hypoalbuminemia   Hypoglycemia due to insulin   AKI on CKD stage IV: Baseline creatinine 2.1-2.5, presented with serum creatinine 10.49 Could be multifactorial, likely ATN due to dehydration, use of medications, Entresto, Aldactone and Jardiance. Bladder scan without obstruction.  UA shows proteinuria and hematuria. Hematuria resolved.  Avoid nephrotoxic medications, hypotension and dehydration. Continue IV hydration.  Monitor serum creatinine.  Serum creatinine slightly improving 10.4>9.64>9.59 Nephrology consulted.  Hold Jardiance, Entresto and Aldactone. If there is no improvement there is likelihood he might need dialysis.  Hyperkalemia: Potassium was 6.9,  Patient has received calcium gluconate, insulin, dextrose, albuterol, Lokelma. No EKG changes.  Potassium remains elevated 6.2 Continue Lokelma  daily until potassium improves. Consider dose of Lasix that will help potassium excretion   Hyperphosphatemia: This is probably secondary to CKD stage IV. Phosphorus is improving.  Hyponatremia: Improved  CAD s/p AICD: Stable, last device check was 6/30. Continue statin and Coreg,  Patient follows with vascular surgery for carotid artery stenosis  Hypoalbuminemia: Due to moderate protein calorie malnutrition Continue protein supplements  Normocytic anemia : Patient has anemia of chronic disease Hemoglobin 7.6 > 7.0 Patient had recent UTI with hematuria.  Hematuria has resolved Transfuse hemoglobin if hemoglobin drops below 7   CHF: Continue Coreg, Hydralazine Entresto and spironolactone keep on hold due to hyperkalemia.  Hypothyroidism:  Continue Synthroid  Asthma with COPD: Continue DuoNeb, Pulmicort and albuterol  GERD :  Continue Protonix  Essential hypertension :   Continue Coreg and hydralazine.  Atrial fibrillation: Heart rate controlled, continue Coumadin  Type 2 diabetes: Continue regular insulin sliding scale and hypoglycemia protocol   DVT prophylaxis: coumadin Code Status: Full code Family Communication: (No family at bedside) Disposition Plan:   Status is: Inpatient  Remains inpatient appropriate because:Inpatient level of care appropriate due to severity of illness  Dispo: The patient is from: Home              Anticipated d/c is to: Home              Patient currently is not medically stable to d/c.   Difficult to place patient No   Consultants:  Nephrology and urology  Procedures:   Antimicrobials:   Anti-infectives (From admission, onward)    None       Subjective: Patient was seen and examined at bedside.  Overnight events noted. He reports feeling better,  urine output has  improved.  Hematuria has resolved.   He reports generalized leg weakness which is improving.  Objective: Vitals:   05/23/21 1700 05/23/21 2132  05/24/21 0603 05/24/21 0917  BP: (!) 150/53 (!) 149/50 (!) 159/60 (!) 168/54  Pulse: 62 60 60 (!) 59  Resp: 17 20 18 19   Temp: 98.2 F (36.8 C) 98.2 F (36.8 C) (!) 97.5 F (36.4 C) 98.6 F (37 C)  TempSrc: Oral Oral Oral Oral  SpO2: 96% 97% 95% 98%  Weight:      Height:        Intake/Output Summary (Last 24 hours) at 05/24/2021 1327 Last data filed at 05/24/2021 1144 Gross per 24 hour  Intake 1980.91 ml  Output 1200 ml  Net 780.91 ml   Filed Weights   05/23/21 0003 05/23/21 1345  Weight: 85 kg 75.5 kg    Examination:  General exam: Appears calm and comfortable,  not in any acute distress. Respiratory system: Clear to auscultation bilaterally, respiratory effort normal. Cardiovascular system: S1-S2 heard, regular rate and rhythm, no murmur, AICD noted in left anterior chest Gastrointestinal system: Abdomen is soft, nontender nondistended, normal bowel sounds. Central nervous system: Alert and oriented x3, no focal neurological deficits. Extremities: 2+ pitting edema noted, no cyanosis, no clubbing Skin: No rashes, lesions or ulcers Psychiatry: Judgement and insight appear normal. Mood & affect appropriate.   Data Reviewed: I have personally reviewed following labs and imaging studies  CBC: Recent Labs  Lab 05/23/21 0030 05/23/21 0521 05/24/21 0121  WBC 10.3 9.8 9.1  NEUTROABS 7.7  --   --   HGB 8.2* 7.6* 7.0*  HCT 25.1* 22.7* 20.5*  MCV 95.8 95.0 94.0  PLT 337 279 093   Basic Metabolic Panel: Recent Labs  Lab 05/23/21 0521 05/23/21 1518 05/23/21 2156 05/24/21 0121 05/24/21 1014  NA 136 134* 135 136 135  K 5.9* 6.7* 6.0* 6.2* 6.2*  CL 104 104 105 106 106  CO2 22 18* 19* 21* 20*  GLUCOSE 59* 100* 88 89 81  BUN 94* 92* 90* 89* 90*  CREATININE 10.52* 10.01* 9.64* 9.52* 9.60*  CALCIUM 9.4 8.7* 8.7* 8.4* 8.4*  MG 2.3  --   --  2.1  --   PHOS 4.8*  --   --  5.3* 5.3*   GFR: Estimated Creatinine Clearance: 6 mL/min (A) (by C-G formula based on SCr of 9.6  mg/dL (H)). Liver Function Tests: Recent Labs  Lab 05/23/21 0030 05/23/21 0521 05/24/21 1014  AST 14* 13*  --   ALT 9 7  --   ALKPHOS 38 33*  --   BILITOT 0.6 0.6  --   PROT 6.0* 5.3*  --   ALBUMIN 3.0* 2.8* 2.5*   No results for input(s): LIPASE, AMYLASE in the last 168 hours. No results for input(s): AMMONIA in the last 168 hours. Coagulation Profile: Recent Labs  Lab 05/18/21 1448 05/21/21 1444 05/23/21 0521 05/24/21 0859  INR 4.7* 2.3* 2.7* 2.6*   Cardiac Enzymes: No results for input(s): CKTOTAL, CKMB, CKMBINDEX, TROPONINI in the last 168 hours. BNP (last 3 results) No results for input(s): PROBNP in the last 8760 hours. HbA1C: Recent Labs    05/23/21 0759  HGBA1C 5.4   CBG: Recent Labs  Lab 05/23/21 1632 05/23/21 1835 05/23/21 2142 05/24/21 0611 05/24/21 1223  GLUCAP 131* 122* 88 80 101*   Lipid Profile: No results for input(s): CHOL, HDL, LDLCALC, TRIG, CHOLHDL, LDLDIRECT in the last 72 hours. Thyroid Function Tests: No results for input(s): TSH, T4TOTAL,  FREET4, T3FREE, THYROIDAB in the last 72 hours. Anemia Panel: Recent Labs    05/24/21 0121  FERRITIN 129  TIBC 189*  IRON 44*   Sepsis Labs: No results for input(s): PROCALCITON, LATICACIDVEN in the last 168 hours.  Recent Results (from the past 240 hour(s))  Urine Culture     Status: None   Collection Time: 05/15/21  2:39 PM   Specimen: Urine   UR  Result Value Ref Range Status   Urine Culture, Routine Final report  Final   Organism ID, Bacteria No growth  Final  Microscopic Examination     Status: Abnormal   Collection Time: 05/18/21  3:01 PM   Urine  Result Value Ref Range Status   WBC, UA 6-10 (A) 0 - 5 /hpf Final   RBC >30 (A) 0 - 2 /hpf Final   Epithelial Cells (non renal) None seen 0 - 10 /hpf Final   Renal Epithel, UA None seen None seen /hpf Final   Bacteria, UA Few (A) None seen/Few Final  Urine Culture     Status: None   Collection Time: 05/18/21  3:26 PM   Specimen:  Urine   UR  Result Value Ref Range Status   Urine Culture, Routine Final report  Final   Organism ID, Bacteria No growth  Final  SARS CORONAVIRUS 2 (TAT 6-24 HRS) Nasopharyngeal Nasopharyngeal Swab     Status: None   Collection Time: 05/23/21  5:46 AM   Specimen: Nasopharyngeal Swab  Result Value Ref Range Status   SARS Coronavirus 2 NEGATIVE NEGATIVE Final    Comment: (NOTE) SARS-CoV-2 target nucleic acids are NOT DETECTED.  The SARS-CoV-2 RNA is generally detectable in upper and lower respiratory specimens during the acute phase of infection. Negative results do not preclude SARS-CoV-2 infection, do not rule out co-infections with other pathogens, and should not be used as the sole basis for treatment or other patient management decisions. Negative results must be combined with clinical observations, patient history, and epidemiological information. The expected result is Negative.  Fact Sheet for Patients: SugarRoll.be  Fact Sheet for Healthcare Providers: https://www.woods-mathews.com/  This test is not yet approved or cleared by the [place] FDA and  has been authorized for detection and/or diagnosis of SARS-CoV-2 by FDA under an Emergency Use Authorization (EUA). This EUA will remain  in effect (meaning this test can be used) for the duration of the COVID-19 declaration under Se ction 564(b)(1) of the Act, 21 U.S.C. section 360bbb-3(b)(1), unless the authorization is terminated or revoked sooner.  Performed at [facility], [place] [address], [place], [place] [address]   MRSA Next Gen by PCR, Nasal     Status: None   Collection Time: 05/23/21  4:02 PM   Specimen: Urine, Catheterized; Nasal Swab  Result Value Ref Range Status   MRSA by PCR Next Gen NOT DETECTED NOT DETECTED Final    Comment: (NOTE) The GeneXpert MRSA Assay (FDA approved for NASAL specimens only), is one component of a comprehensive MRSA colonization  surveillance program. It is not intended to diagnose MRSA infection nor to guide or monitor treatment for MRSA infections. Test performance is not FDA approved in patients less than 33 years old. Performed at [facility], [place] [address] [place], [place] [address]     Radiology Studies: US RENAL  Result Date: 05/23/2021 CLINICAL DATA:  "Bladder neck obstruction". EXAM: RENAL / URINARY TRACT ULTRASOUND COMPLETE COMPARISON:  05/13/2021 CT FINDINGS: Right Kidney: Renal measurements: 11.0 by  6.6 x 5.6 cm = volume: 211 mL. No hydronephrosis. A lower pole 1.6 cm markedly hypoechoic to anechoic lesion. Left Kidney: Renal measurements: 8.0 x 3.1 x 2.9 cm = volume: 38.1 mL. Moderately atrophic. No hydronephrosis. Bladder: Appears normal for degree of bladder distention. Other: None. IMPRESSION: 1. Left renal atrophy. 2. Lower pole right renal cyst or minimally complex cyst. Electronically Signed   By: Abigail Miyamoto M.D.   On: 05/23/2021 08:32    Scheduled Meds:  budesonide  0.25 mg Nebulization BID   carvedilol  9.375 mg Oral BID   Chlorhexidine Gluconate Cloth  6 each Topical Daily   feeding supplement (GLUCERNA SHAKE)  237 mL Oral TID BM   ferrous sulfate  325 mg Oral Q breakfast   furosemide  40 mg Intravenous Once   hydrALAZINE  50 mg Oral TID   insulin aspart  0-5 Units Subcutaneous QHS   insulin aspart  0-6 Units Subcutaneous TID WC   ipratropium-albuterol  3 mL Nebulization TID   levothyroxine  112 mcg Oral Q0600   pantoprazole  40 mg Oral BID   pravastatin  40 mg Oral q1800   sodium zirconium cyclosilicate  10 g Oral TID   warfarin  2.5 mg Oral q1600   Warfarin - Physician Dosing Inpatient   Does not apply q1600   Continuous Infusions:  sodium chloride 125 mL/hr at 05/24/21 0609   ferric gluconate (FERRLECIT) IVPB       LOS: 1 day    Time spent: 35 mins.    Shawna Clamp, MD [facility]   If 7PM-7AM, please contact night-coverage

## 2021-05-24 NOTE — Progress Notes (Signed)
[name] [name] PROGRESS NOTE  Assessment/ Plan: Pt is a 80 y.o. yo male  with medical history of hypertension, COPD, CAD status post CABG, A. fib on anticoagulation, CHF, CKD 4 who was presented to the ER for abnormal lab results (sent by his urologist) seen as a consultation for the evaluation of AKI on CKD and hyperkalemia.  #Acute kidney injury on CKD stage IV likely due to ischemic ATN due to dehydration/decreased oral intake concomitant with the use of Entresto, Aldactone and Jardiance in atrophic left kidney/bilateral renal artery stenosis. Bladder scan and kidney ultrasound without obstruction, has left renal atrophy.  UA with blood and around 1.9 g of proteinuria.   Treated with IV fluid and insertion of Foley catheter.  Creatinine level trending down.  Potassium remains 6.2 today.  Ordering another dose of IV Lasix to excrete potassium concomitant with current IV fluid.  Continue to hold Jardiance, Entresto and Aldactone.  We will repeat lab in the evening. He is nonoliguric and hopefully the kidney function will improve gradually.  I have discussed with the patient and his wife that if there is no improvement in kidney function then he may need dialysis.   #Hyperkalemia: Due to use of Entresto, Aldactone and low GFR. Bleeding/hematuria might have also contributed.  Treating with Lokelma, IV fluid, bicarbonate and Lasix.  Order another dose of IV Lasix today.  Continue IV fluid.  Monitor lab.   #Hypertension: Holding cardiac medication because of AKI.  Agree with hydralazine.  Monitor blood pressure.   #Hematuria: Seen by urologist and concern about possible right renal pelvis mass.  Recommending urology procedure for further evaluation.  Urology team is following.     #Anemia due to blood loss/hematuria and chronic illness/CKD: Iron stores low therefore I will order IV iron.  No ESA because of possible pelvic mass.    #History of CHF: Looks euvolemic to dry on exam.   Holding cardiac medication because of AKI.  Subjective: Seen and examined at bedside.  Reports feeling much better.  Denies nausea vomiting chest pain shortness of breath.  His wife and daughter present.  Urine output recorded around 1 L. Objective Vital signs in last 24 hours: Vitals:   05/23/21 1700 05/23/21 2132 05/24/21 0603 05/24/21 0917  BP: (!) 150/53 (!) 149/50 (!) 159/60 (!) 168/54  Pulse: 62 60 60 (!) 59  Resp: 17 20 18 19   Temp: 98.2 F (36.8 C) 98.2 F (36.8 C) (!) 97.5 F (36.4 C) 98.6 F (37 C)  TempSrc: Oral Oral Oral Oral  SpO2: 96% 97% 95% 98%  Weight:      Height:       Weight change: -9.476 kg  Intake/Output Summary (Last 24 hours) at 05/24/2021 1256 Last data filed at 05/24/2021 1144 Gross per 24 hour  Intake 1980.91 ml  Output 1200 ml  Net 780.91 ml       Labs: Basic Metabolic Panel: Recent Labs  Lab 05/23/21 0521 05/23/21 1518 05/23/21 2156 05/24/21 0121 05/24/21 1014  NA 136   < > 135 136 135  K 5.9*   < > 6.0* 6.2* 6.2*  CL 104   < > 105 106 106  CO2 22   < > 19* 21* 20*  GLUCOSE 59*   < > 88 89 81  BUN 94*   < > 90* 89* 90*  CREATININE 10.52*   < > 9.64* 9.52* 9.60*  CALCIUM 9.4   < > 8.7* 8.4* 8.4*  PHOS 4.8*  --   --  5.3* 5.3*   < > = values in this interval not displayed.   Liver Function Tests: Recent Labs  Lab 05/23/21 0030 05/23/21 0521 05/24/21 1014  AST 14* 13*  --   ALT 9 7  --   ALKPHOS 38 33*  --   BILITOT 0.6 0.6  --   PROT 6.0* 5.3*  --   ALBUMIN 3.0* 2.8* 2.5*   No results for input(s): LIPASE, AMYLASE in the last 168 hours. No results for input(s): AMMONIA in the last 168 hours. CBC: Recent Labs  Lab 05/23/21 0030 05/23/21 0521 05/24/21 0121  WBC 10.3 9.8 9.1  NEUTROABS 7.7  --   --   HGB 8.2* 7.6* 7.0*  HCT 25.1* 22.7* 20.5*  MCV 95.8 95.0 94.0  PLT 337 279 264   Cardiac Enzymes: No results for input(s): CKTOTAL, CKMB, CKMBINDEX, TROPONINI in the last 168 hours. CBG: Recent Labs  Lab  05/23/21 1632 05/23/21 1835 05/23/21 2142 05/24/21 0611 05/24/21 1223  GLUCAP 131* 122* 88 80 101*    Iron Studies:  Recent Labs    05/24/21 0121  IRON 44*  TIBC 189*  FERRITIN 129   Studies/Results: US RENAL  Result Date: 05/23/2021 CLINICAL DATA:  "Bladder neck obstruction". EXAM: RENAL / URINARY TRACT ULTRASOUND COMPLETE COMPARISON:  05/13/2021 CT FINDINGS: Right Kidney: Renal measurements: 11.0 by 6.6 x 5.6 cm = volume: 211 mL. No hydronephrosis. A lower pole 1.6 cm markedly hypoechoic to anechoic lesion. Left Kidney: Renal measurements: 8.0 x 3.1 x 2.9 cm = volume: 38.1 mL. Moderately atrophic. No hydronephrosis. Bladder: Appears normal for degree of bladder distention. Other: None. IMPRESSION: 1. Left renal atrophy. 2. Lower pole right renal cyst or minimally complex cyst. Electronically Signed   By: Abigail Miyamoto M.D.   On: 05/23/2021 08:32    Medications: Infusions:  sodium chloride 125 mL/hr at 05/24/21 0609    Scheduled Medications:  budesonide  0.25 mg Nebulization BID   carvedilol  9.375 mg Oral BID   Chlorhexidine Gluconate Cloth  6 each Topical Daily   feeding supplement (GLUCERNA SHAKE)  237 mL Oral TID BM   ferrous sulfate  325 mg Oral Q breakfast   furosemide  40 mg Intravenous Once   hydrALAZINE  50 mg Oral TID   insulin aspart  0-5 Units Subcutaneous QHS   insulin aspart  0-6 Units Subcutaneous TID WC   ipratropium-albuterol  3 mL Nebulization TID   levothyroxine  112 mcg Oral Q0600   pantoprazole  40 mg Oral BID   pravastatin  40 mg Oral q1800   sodium zirconium cyclosilicate  10 g Oral TID   warfarin  2.5 mg Oral q1600   Warfarin - Physician Dosing Inpatient   Does not apply q1600    have reviewed scheduled and prn medications.  Physical Exam: General:NAD, comfortable Heart:RRR, s1s2 nl Lungs:clear b/l, no crackle Abdomen:soft, Non-tender, non-distended Extremities:No edema Neurology: Alert awake and following commands.  [name] [name]  [name] [name] 05/24/2021,12:56 PM  LOS: 1 day

## 2021-05-24 NOTE — Plan of Care (Signed)

## 2021-05-24 NOTE — Plan of Care (Signed)
  Problem: Education: Goal: Knowledge of General Education information will improve Description Including pain rating scale, medication(s)/side effects and non-pharmacologic comfort measures Outcome: Progressing   

## 2021-05-25 ENCOUNTER — Ambulatory Visit: Payer: Medicare Other | Admitting: Family Medicine

## 2021-05-25 DIAGNOSIS — N179 Acute kidney failure, unspecified: Secondary | ICD-10-CM | POA: Diagnosis not present

## 2021-05-25 DIAGNOSIS — N189 Chronic kidney disease, unspecified: Secondary | ICD-10-CM | POA: Diagnosis not present

## 2021-05-25 LAB — BASIC METABOLIC PANEL
Anion gap: 10 (ref 5–15)
BUN: 86 mg/dL — ABNORMAL HIGH (ref 8–23)
CO2: 19 mmol/L — ABNORMAL LOW (ref 22–32)
Calcium: 8.1 mg/dL — ABNORMAL LOW (ref 8.9–10.3)
Chloride: 108 mmol/L (ref 98–111)
Creatinine, Ser: 9.12 mg/dL — ABNORMAL HIGH (ref 0.61–1.24)
GFR, Estimated: 5 mL/min — ABNORMAL LOW (ref >60)
Glucose, Bld: 94 mg/dL (ref 70–99)
Potassium: 5.1 mmol/L (ref 3.5–5.1)
Sodium: 137 mmol/L (ref 135–145)

## 2021-05-25 LAB — HEMOGLOBIN AND HEMATOCRIT, BLOOD
HCT: 19.9 % — ABNORMAL LOW (ref 39.0–52.0)
Hemoglobin: 6.6 g/dL — CL (ref 13.0–17.0)

## 2021-05-25 LAB — CBC
HCT: 21.5 % — ABNORMAL LOW (ref 39.0–52.0)
Hemoglobin: 7 g/dL — ABNORMAL LOW (ref 13.0–17.0)
MCH: 31.3 pg (ref 26.0–34.0)
MCHC: 32.6 g/dL (ref 30.0–36.0)
MCV: 96 fL (ref 80.0–100.0)
Platelets: 265 10*3/uL (ref 150–400)
RBC: 2.24 MIL/uL — ABNORMAL LOW (ref 4.22–5.81)
RDW: 13.1 % (ref 11.5–15.5)
WBC: 10.5 10*3/uL (ref 4.0–10.5)
nRBC: 0 % (ref 0.0–0.2)

## 2021-05-25 LAB — GLUCOSE, CAPILLARY
Glucose-Capillary: 83 mg/dL (ref 70–99)
Glucose-Capillary: 90 mg/dL (ref 70–99)
Glucose-Capillary: 101 mg/dL — ABNORMAL HIGH (ref 70–99)
Glucose-Capillary: 94 mg/dL (ref 70–99)
Glucose-Capillary: 103 mg/dL — ABNORMAL HIGH (ref 70–99)

## 2021-05-25 LAB — MAGNESIUM: Magnesium: 2.1 mg/dL (ref 1.7–2.4)

## 2021-05-25 LAB — PROTIME-INR
INR: 3 — ABNORMAL HIGH (ref 0.8–1.2)
Prothrombin Time: 30.7 seconds — ABNORMAL HIGH (ref 11.4–15.2)

## 2021-05-25 LAB — PREPARE RBC (CROSSMATCH)

## 2021-05-25 MED ORDER — ADULT MULTIVITAMIN W/MINERALS CH
1.0000 | ORAL_TABLET | Freq: Every day | ORAL | Status: DC
  Administered 2021-05-25 – 2021-06-05 (×8): 1 via ORAL
  Filled 2021-05-25 (×10): qty 1

## 2021-05-25 MED ORDER — SODIUM CHLORIDE 0.9% IV SOLUTION: Freq: Once | INTRAVENOUS | Status: DC

## 2021-05-25 MED ORDER — WARFARIN SODIUM 2.5 MG PO TABS
2.5000 mg | ORAL_TABLET | Freq: Every day | ORAL | Status: DC
  Administered 2021-05-25 – 2021-05-26 (×2): 2.5 mg via ORAL
  Filled 2021-05-25 (×2): qty 1

## 2021-05-25 MED ORDER — ENSURE ENLIVE PO LIQD
237.0000 mL | Freq: Two times a day (BID) | ORAL | Status: DC
  Administered 2021-05-28 (×2): 237 mL via ORAL

## 2021-05-25 MED ORDER — SODIUM BICARBONATE 650 MG PO TABS
650.0000 mg | ORAL_TABLET | Freq: Three times a day (TID) | ORAL | Status: AC
  Administered 2021-05-25 – 2021-05-26 (×6): 650 mg via ORAL
  Filled 2021-05-25 (×7): qty 1

## 2021-05-25 NOTE — Progress Notes (Signed)
Initial Nutrition Assessment  DOCUMENTATION CODES:  Not applicable  INTERVENTION:  Discontinue Glucerna shakes TID.  Add Ensure Plus po BID, each supplement provides 350 kcal and 13 grams of protein.   Add Magic cup TID with meals, each supplement provides 290 kcal and 9 grams of protein.  Add MVI with minerals daily.  NUTRITION DIAGNOSIS:  Increased nutrient needs related to acute illness (AKI) as evidenced by estimated needs.  GOAL:  Patient will meet greater than or equal to 90% of their needs  MONITOR:  PO intake, Supplement acceptance, Labs, Weight trends, I & O's  REASON FOR ASSESSMENT:  Malnutrition Screening Tool    ASSESSMENT:  80 yo male with a PMH of CAD s/p CABG, atrial fibrillation, CHF, AICD, COPD, CKD stage IIIb, presented in the ED after he was sent from PCP to go to the ED for abnormal labs.  He was found to have hyperkalemia and AKI.  CT abdomen and pelvis showed mild right hydronephrosis.  Pt reports eating well at home. He reports not having any appetite changes, but he reports significant nausea today during RD visit. RD went to get nurse to distribute ani-emetic medication. Pt eating 50-100% of last 4 meals per Epic.  Pt reports no significant weight changes. Pt's weight is around 77-78 kg normally it appears, per Epic. Pt has significant edema in legs, which may be adding to weight and masking loss.  Recommend discontinuing Glucerna shakes given pt's good blood sugar control. Recommend adding Ensure Plus BID, Magic Cup TID, and MVI with minerals daily.  Medications: reviewed; Glucerna shakes TID, ferrous sulfate, SSI, Synthroid, Protonix BID, Na Bicarb TID, Warfarin, NaCl @ 125 ml/hr  Labs: reviewed; CBG 83-101 HbA1c: 5.4% (05/23/2021)  NUTRITION - FOCUSED PHYSICAL EXAM: Flowsheet Row Most Recent Value  Orbital Region Mild depletion  Upper Arm Region No depletion  Thoracic and Lumbar Region No depletion  Buccal Region Moderate depletion  Temple  Region No depletion  Clavicle Bone Region No depletion  Clavicle and Acromion Bone Region No depletion  Scapular Bone Region No depletion  Dorsal Hand No depletion  Patellar Region No depletion  Anterior Thigh Region No depletion  Posterior Calf Region No depletion  Edema (RD Assessment) Severe  [BLE]  Hair Reviewed  Eyes Reviewed  Mouth Reviewed  Skin Reviewed  Nails Reviewed   Diet Order:   Diet Order             Diet Heart Room service appropriate? Yes; Fluid consistency: Thin  Diet effective now                  EDUCATION NEEDS:  Education needs have been addressed  Skin:  Skin Assessment: Reviewed RN Assessment  Last BM:  05/24/21 - Type 6, green, large  Height:  Ht Readings from Last 1 Encounters:  05/23/21 5\' 9"  (1.753 m)   Weight:  Wt Readings from Last 1 Encounters:  05/23/21 75.5 kg   BMI:  Body mass index is 24.59 kg/m.  Estimated Nutritional Needs:  Kcal:  1950-2150 Protein:  90-105 grams Fluid:  UOP + 1000 ml  Derrel Nip, RD, LDN (she/her/hers) Registered Dietitian I After-Hours/Weekend Pager # in [place]

## 2021-05-25 NOTE — Progress Notes (Signed)
[facility] NEPHROLOGY PROGRESS NOTE  Assessment/ Plan: Pt is a 80 y.o. yo male  with medical history of hypertension, COPD, CAD status post CABG, A. fib on anticoagulation, CHF, CKD 4 who was presented to the ER for abnormal lab results (sent by his urologist) seen as a consultation for the evaluation of AKI on CKD and hyperkalemia.  #Acute kidney injury on CKD stage IV likely due to ischemic ATN due to dehydration/decreased oral intake concomitant with the use of Entresto, Aldactone and Jardiance in atrophic left kidney/bilateral renal artery stenosis. Bladder scan and kidney ultrasound without obstruction, has left renal atrophy.  UA with blood and around 1.9 g of proteinuria.   Treated with IV fluid and insertion of Foley catheter.  Creatinine level trending down.  Potassium improved to 5.1, on Lokelma Continue to hold Jardiance, Entresto and Aldactone.  We will repeat lab in the evening. NO indication for HD at this time On NS @ 125/h, reduce to 61mL/h with hx/o CHF; plan for hard stop to IVFs in 24h   #Hyperkalemia: Due to use of Entresto, Aldactone and low GFR. Bleeding/hematuria might have also contributed.  Treating with Lokelma, IV fluid, bicarbonate and Lasix.  Improved.  Continue IV fluid as above.  Monitor lab.   #Hypertension: Holding cardiac medication because of AKI.  Carvedilol and hydralazine.     #Hematuria: Seen by urologist and concern about possible right renal pelvis mass.  Recommending outpt eval. Per Urology.     #Anemia due to blood loss/hematuria and chronic illness/CKD: Transfuse as needed. For now, no ESA because of possible pelvic mass.    #History of CHF: not overloaded, remain vigilant.  Holding cardiac medication because of AKI.  Subjective:  No interval events K 5.1 on TID Lokelma, better SCR/BUN slowly improving UOP picking up, 2.3L recorded On RA, feels weak  Objective Vital signs in last 24 hours: Vitals:   05/25/21 0550 05/25/21  0909 05/25/21 0913 05/25/21 0914  BP: (!) 160/53 (!) 171/50    Pulse: 60 (!) 59    Resp: 18 16    Temp: 98 F (36.7 C) 98.4 F (36.9 C)    TempSrc: Oral Oral    SpO2: 100% 97% 98% 98%  Weight:      Height:       Weight change:   Intake/Output Summary (Last 24 hours) at 05/25/2021 1518 Last data filed at 05/25/2021 0558 Gross per 24 hour  Intake 120 ml  Output 1125 ml  Net -1005 ml        Labs: Basic Metabolic Panel: Recent Labs  Lab 05/23/21 0521 05/23/21 1518 05/24/21 0121 05/24/21 1014 05/24/21 1732 05/25/21 0729  NA 136   < > 136 135 137 137  K 5.9*   < > 6.2* 6.2* 5.8* 5.1  CL 104   < > 106 106 108 108  CO2 22   < > 21* 20* 19* 19*  GLUCOSE 59*   < > 89 81 130* 94  BUN 94*   < > 89* 90* 91* 86*  CREATININE 10.52*   < > 9.52* 9.60* 9.21* 9.12*  CALCIUM 9.4   < > 8.4* 8.4* 8.2* 8.1*  PHOS 4.8*  --  5.3* 5.3*  --   --    < > = values in this interval not displayed.    Liver Function Tests: Recent Labs  Lab 05/23/21 0030 05/23/21 0521 05/24/21 1014  AST 14* 13*  --   ALT 9 7  --  ALKPHOS 38 33*  --   BILITOT 0.6 0.6  --   PROT 6.0* 5.3*  --   ALBUMIN 3.0* 2.8* 2.5*    No results for input(s): LIPASE, AMYLASE in the last 168 hours. No results for input(s): AMMONIA in the last 168 hours. CBC: Recent Labs  Lab 05/23/21 0030 05/23/21 0521 05/24/21 0121 05/25/21 0729 05/25/21 1418  WBC 10.3 9.8 9.1 10.5  --   NEUTROABS 7.7  --   --   --   --   HGB 8.2* 7.6* 7.0* 7.0* 6.6*  HCT 25.1* 22.7* 20.5* 21.5* 19.9*  MCV 95.8 95.0 94.0 96.0  --   PLT 337 279 264 265  --     Cardiac Enzymes: No results for input(s): CKTOTAL, CKMB, CKMBINDEX, TROPONINI in the last 168 hours. CBG: Recent Labs  Lab 05/24/21 1638 05/24/21 2210 05/25/21 0556 05/25/21 0645 05/25/21 1143  GLUCAP 101* 99 83 90 101*     Iron Studies:  Recent Labs    05/24/21 0121  IRON 44*  TIBC 189*  FERRITIN 129    Studies/Results: No results  found.  Medications: Infusions:  sodium chloride 125 mL/hr at 05/25/21 7001    Scheduled Medications:  sodium chloride   Intravenous Once   budesonide  0.25 mg Nebulization BID   carvedilol  9.375 mg Oral BID   Chlorhexidine Gluconate Cloth  6 each Topical Daily   feeding supplement  237 mL Oral BID BM   ferrous sulfate  325 mg Oral Q breakfast   hydrALAZINE  50 mg Oral TID   insulin aspart  0-5 Units Subcutaneous QHS   insulin aspart  0-6 Units Subcutaneous TID WC   ipratropium-albuterol  3 mL Nebulization TID   levothyroxine  112 mcg Oral Q0600   multivitamin with minerals  1 tablet Oral Daily   pantoprazole  40 mg Oral BID   pravastatin  40 mg Oral q1800   sodium bicarbonate  650 mg Oral TID   sodium zirconium cyclosilicate  10 g Oral TID   warfarin  2.5 mg Oral q1600   Warfarin - Physician Dosing Inpatient   Does not apply q1600    have reviewed scheduled and prn medications.  Physical Exam: General:NAD, comfortable Heart:RRR, s1s2 nl Lungs:clear b/l, no crackle Abdomen:soft, Non-tender, non-distended Extremities:No edema Neurology: Alert awake and following commands.  Debany Vantol B Ritvik Mczeal 05/25/2021,3:18 PM  LOS: 2 days

## 2021-05-25 NOTE — Progress Notes (Addendum)
Chart review of renal function.  Cr trending down very slowly. No hydro on renal US from admission.  Will plan on evaluation of right renal pelvis pending improvement of AKI and discharge from hospital.

## 2021-05-25 NOTE — Progress Notes (Signed)
PROGRESS NOTE    [name] [name]  UYQ:[id number] DOB: October 26, 1940 DOA: 05/22/2021 PCP: Claretta Fraise, MD   Brief Narrative:  This 80 years old male with PMH significant for CAD s/p CABG, atrial fibrillation, CHF, AICD, COPD, CKD stage IIIb, presented in the ED after he was sent from PCP to go to the ED for abnormal labs.  He was found to have hyperkalemia and AKI.  CT abdomen and pelvis showed mild right hydronephrosis, Nephrology and urology was consulted.  Patient was given calcium gluconate, insulin with dextrose, albuterol and Lokelma for hyperkalemia.  May require cystoscopy,  bilateral retrograde ureteroscopy or possible biopsy to rule out neoplasm.  Assessment & Plan:   Principal Problem:   Acute kidney injury superimposed on CKD ([place]) Active Problems:   Essential hypertension   Normocytic anemia   Chronic systolic CHF (congestive heart failure) (HCC)   Asthma with COPD ([place])   Hyperlipemia   Coronary atherosclerosis of native coronary artery   Hypothyroidism   ICD (implantable cardioverter-defibrillator) in place   Hyperkalemia   Hyperphosphatemia   Hyponatremia   Hypoalbuminemia   Hypoglycemia due to insulin   AKI on CKD stage IV: Baseline creatinine 2.1-2.5, presented with serum creatinine 10.49 Could be multifactorial, likely ATN due to dehydration, use of medications, Entresto, Aldactone and Jardiance. Bladder scan without obstruction.  UA shows proteinuria and hematuria. Hematuria resolved.  Avoid nephrotoxic medications, hypotension and dehydration. Continue IV hydration.  Monitor serum creatinine.   Serum creatinine slightly improving 10.4>9.64>9.59 >9.21>9.12 Nephrology consulted.  Hold Jardiance, Entresto and Aldactone. If there is no improvement there is high likelihood he might need dialysis.  Hyperkalemia: Potassium was 6.9,  Patient has received calcium gluconate, insulin, dextrose, albuterol, Lokelma. No EKG changes.  Potassium remains elevated  6.2 Continue Lokelma daily until potassium improves. Consider dose of Lasix that will help potassium excretion. Potassium is trending down 6.9> 6.2> 5.1   Hyperphosphatemia: This is probably secondary to CKD stage IV. Phosphorus is improving.  Hyponatremia: Improved  CAD s/p AICD: Stable, last device check was 6/30./22 Continue statin and Coreg,  Patient follows with vascular surgery for carotid artery stenosis  Hypoalbuminemia: Due to moderate protein calorie malnutrition Continue protein supplements  Normocytic anemia : Patient has anemia of chronic disease Hemoglobin 7.6 > 7.0 >7.0 Patient had recent UTI with hematuria.  Hematuria has resolved Transfuse hemoglobin if hemoglobin drops below 7.   CHF: Continue Coreg, Hydralazine Entresto and spironolactone keep on hold due to hyperkalemia.  Hypothyroidism:  Continue Synthroid  Asthma with COPD: Continue DuoNeb, Pulmicort and albuterol  GERD :  Continue Protonix  Essential hypertension :   Continue Coreg and hydralazine.  Atrial fibrillation: Heart rate controlled, continue Coumadin  Type 2 diabetes: Continue regular insulin sliding scale and hypoglycemia protocol   DVT prophylaxis: coumadin Code Status: Full code Family Communication: (No family at bedside) Disposition Plan:   Status is: Inpatient  Remains inpatient appropriate because:Inpatient level of care appropriate due to severity of illness  Dispo: The patient is from: Home              Anticipated d/c is to: Home              Patient currently is not medically stable to d/c.   Difficult to place patient No   Consultants:  Nephrology and urology  Procedures:   Antimicrobials:   Anti-infectives (From admission, onward)    None       Subjective: Patient was seen and examined at bedside.  Overnight events noted. He reports feeling better, he is alert and awake,  oriented  x 3 , appears improved. Hematuria has resolved.  He reports  generalized weakness is improving.  Objective: Vitals:   05/25/21 0550 05/25/21 0909 05/25/21 0913 05/25/21 0914  BP: (!) 160/53 (!) 171/50    Pulse: 60 (!) 59    Resp: 18 16    Temp: 98 F (36.7 C) 98.4 F (36.9 C)    TempSrc: Oral Oral    SpO2: 100% 97% 98% 98%  Weight:      Height:        Intake/Output Summary (Last 24 hours) at 05/25/2021 1106 Last data filed at 05/25/2021 0558 Gross per 24 hour  Intake 860.02 ml  Output 1325 ml  Net -464.98 ml   Filed Weights   05/23/21 0003 05/23/21 1345  Weight: 85 kg 75.5 kg    Examination:  General exam: Appears calm and comfortable,  not in any acute distress. Respiratory system: Clear to auscultation bilaterally, respiratory rate normal. Cardiovascular system: S1-S2 heard, regular rate and rhythm, no murmur, AICD noted in left anterior chest Gastrointestinal system: Abdomen is soft, nontender nondistended, normal bowel sounds. Central nervous system: Alert and oriented x3, no focal neurological deficits. Extremities: 2+ pitting edema, no clubbing. skin: No rashes, lesions or ulcers Psychiatry: Judgement and insight appear normal. Mood & affect appropriate.   Data Reviewed: I have personally reviewed following labs and imaging studies  CBC: Recent Labs  Lab 05/23/21 0030 05/23/21 0521 05/24/21 0121 05/25/21 0729  WBC 10.3 9.8 9.1 10.5  NEUTROABS 7.7  --   --   --   HGB 8.2* 7.6* 7.0* 7.0*  HCT 25.1* 22.7* 20.5* 21.5*  MCV 95.8 95.0 94.0 96.0  PLT 337 279 264 324   Basic Metabolic Panel: Recent Labs  Lab 05/23/21 0521 05/23/21 1518 05/23/21 2156 05/24/21 0121 05/24/21 1014 05/24/21 1732 05/25/21 0729  NA 136   < > 135 136 135 137 137  K 5.9*   < > 6.0* 6.2* 6.2* 5.8* 5.1  CL 104   < > 105 106 106 108 108  CO2 22   < > 19* 21* 20* 19* 19*  GLUCOSE 59*   < > 88 89 81 130* 94  BUN 94*   < > 90* 89* 90* 91* 86*  CREATININE 10.52*   < > 9.64* 9.52* 9.60* 9.21* 9.12*  CALCIUM 9.4   < > 8.7* 8.4* 8.4* 8.2* 8.1*   MG 2.3  --   --  2.1  --   --  2.1  PHOS 4.8*  --   --  5.3* 5.3*  --   --    < > = values in this interval not displayed.   GFR: Estimated Creatinine Clearance: 6.4 mL/min (A) (by C-G formula based on SCr of 9.12 mg/dL (H)). Liver Function Tests: Recent Labs  Lab 05/23/21 0030 05/23/21 0521 05/24/21 1014  AST 14* 13*  --   ALT 9 7  --   ALKPHOS 38 33*  --   BILITOT 0.6 0.6  --   PROT 6.0* 5.3*  --   ALBUMIN 3.0* 2.8* 2.5*   No results for input(s): LIPASE, AMYLASE in the last 168 hours. No results for input(s): AMMONIA in the last 168 hours. Coagulation Profile: Recent Labs  Lab 05/18/21 1448 05/21/21 1444 05/23/21 0521 05/24/21 0859 05/25/21 0729  INR 4.7* 2.3* 2.7* 2.6* 3.0*   Cardiac Enzymes: No results for input(s): CKTOTAL, CKMB, CKMBINDEX, TROPONINI in  the last 168 hours. BNP (last 3 results) No results for input(s): PROBNP in the last 8760 hours. HbA1C: Recent Labs    05/23/21 0759  HGBA1C 5.4   CBG: Recent Labs  Lab 05/24/21 1223 05/24/21 1638 05/24/21 2210 05/25/21 0556 05/25/21 0645  GLUCAP 101* 101* 99 83 90   Lipid Profile: No results for input(s): CHOL, HDL, LDLCALC, TRIG, CHOLHDL, LDLDIRECT in the last 72 hours. Thyroid Function Tests: No results for input(s): TSH, T4TOTAL, FREET4, T3FREE, THYROIDAB in the last 72 hours. Anemia Panel: Recent Labs    05/24/21 0121  FERRITIN 129  TIBC 189*  IRON 44*   Sepsis Labs: No results for input(s): PROCALCITON, LATICACIDVEN in the last 168 hours.  Recent Results (from the past 240 hour(s))  Urine Culture     Status: None   Collection Time: 05/15/21  2:39 PM   Specimen: Urine   UR  Result Value Ref Range Status   Urine Culture, Routine Final report  Final   Organism ID, Bacteria No growth  Final  Microscopic Examination     Status: Abnormal   Collection Time: 05/18/21  3:01 PM   Urine  Result Value Ref Range Status   WBC, UA 6-10 (A) 0 - 5 /hpf Final   RBC >30 (A) 0 - 2 /hpf Final    Epithelial Cells (non renal) None seen 0 - 10 /hpf Final   Renal Epithel, UA None seen None seen /hpf Final   Bacteria, UA Few (A) None seen/Few Final  Urine Culture     Status: None   Collection Time: 05/18/21  3:26 PM   Specimen: Urine   UR  Result Value Ref Range Status   Urine Culture, Routine Final report  Final   Organism ID, Bacteria No growth  Final  SARS CORONAVIRUS 2 (TAT 6-24 HRS) Nasopharyngeal Nasopharyngeal Swab     Status: None   Collection Time: 05/23/21  5:46 AM   Specimen: Nasopharyngeal Swab  Result Value Ref Range Status   SARS Coronavirus 2 NEGATIVE NEGATIVE Final    Comment: (NOTE) SARS-CoV-2 target nucleic acids are NOT DETECTED.  The SARS-CoV-2 RNA is generally detectable in upper and lower respiratory specimens during the acute phase of infection. Negative results do not preclude SARS-CoV-2 infection, do not rule out co-infections with other pathogens, and should not be used as the sole basis for treatment or other patient management decisions. Negative results must be combined with clinical observations, patient history, and epidemiological information. The expected result is Negative.  Fact Sheet for Patients: SugarRoll.be  Fact Sheet for Healthcare Providers: https://www.woods-mathews.com/  This test is not yet approved or cleared by the [place] FDA and  has been authorized for detection and/or diagnosis of SARS-CoV-2 by FDA under an Emergency Use Authorization (EUA). This EUA will remain  in effect (meaning this test can be used) for the duration of the COVID-19 declaration under Se ction 564(b)(1) of the Act, 21 U.S.C. section 360bbb-3(b)(1), unless the authorization is terminated or revoked sooner.  Performed at [facility], [place] [address] [place], [place] [address]   MRSA Next Gen by PCR, Nasal     Status: None   Collection Time: 05/23/21  4:02 PM   Specimen: Urine, Catheterized; Nasal  Swab  Result Value Ref Range Status   MRSA by PCR Next Gen NOT DETECTED NOT DETECTED Final    Comment: (NOTE) The GeneXpert MRSA Assay (FDA approved for NASAL specimens only), is one component of a comprehensive MRSA colonization  surveillance program. It is not intended to diagnose MRSA infection nor to guide or monitor treatment for MRSA infections. Test performance is not FDA approved in patients less than 64 years old. Performed at [facility], [place] [address], [place], [place] [address]     Radiology Studies: No results found.  Scheduled Meds:  budesonide  0.25 mg Nebulization BID   carvedilol  9.375 mg Oral BID   Chlorhexidine Gluconate Cloth  6 each Topical Daily   feeding supplement (GLUCERNA SHAKE)  237 mL Oral TID BM   ferrous sulfate  325 mg Oral Q breakfast   hydrALAZINE  50 mg Oral TID   insulin aspart  0-5 Units Subcutaneous QHS   insulin aspart  0-6 Units Subcutaneous TID WC   ipratropium-albuterol  3 mL Nebulization TID   levothyroxine  112 mcg Oral Q0600   pantoprazole  40 mg Oral BID   pravastatin  40 mg Oral q1800   sodium bicarbonate  650 mg Oral TID   sodium zirconium cyclosilicate  10 g Oral TID   warfarin  2.5 mg Oral q1600   Warfarin - Physician Dosing Inpatient   Does not apply q1600   Continuous Infusions:  sodium chloride 125 mL/hr at 05/25/21 0632     LOS: 2 days    Time spent: 25 mins.    Shawna Clamp, MD [facility]   If 7PM-7AM, please contact night-coverage

## 2021-05-26 DIAGNOSIS — N179 Acute kidney failure, unspecified: Secondary | ICD-10-CM | POA: Diagnosis not present

## 2021-05-26 DIAGNOSIS — N189 Chronic kidney disease, unspecified: Secondary | ICD-10-CM | POA: Diagnosis not present

## 2021-05-26 LAB — BPAM RBC
Blood Product Expiration Date: 202209132359
ISSUE DATE / TIME: 202208221819
Unit Type and Rh: 6200

## 2021-05-26 LAB — CBC
HCT: 25.4 % — ABNORMAL LOW (ref 39.0–52.0)
Hemoglobin: 8.5 g/dL — ABNORMAL LOW (ref 13.0–17.0)
MCH: 31.8 pg (ref 26.0–34.0)
MCHC: 33.5 g/dL (ref 30.0–36.0)
MCV: 95.1 fL (ref 80.0–100.0)
Platelets: 271 10*3/uL (ref 150–400)
RBC: 2.67 MIL/uL — ABNORMAL LOW (ref 4.22–5.81)
RDW: 13.8 % (ref 11.5–15.5)
WBC: 11.4 10*3/uL — ABNORMAL HIGH (ref 4.0–10.5)
nRBC: 0 % (ref 0.0–0.2)

## 2021-05-26 LAB — BASIC METABOLIC PANEL
Anion gap: 10 (ref 5–15)
BUN: 87 mg/dL — ABNORMAL HIGH (ref 8–23)
CO2: 18 mmol/L — ABNORMAL LOW (ref 22–32)
Calcium: 8.3 mg/dL — ABNORMAL LOW (ref 8.9–10.3)
Chloride: 109 mmol/L (ref 98–111)
Creatinine, Ser: 9.09 mg/dL — ABNORMAL HIGH (ref 0.61–1.24)
GFR, Estimated: 5 mL/min — ABNORMAL LOW (ref >60)
Glucose, Bld: 96 mg/dL (ref 70–99)
Potassium: 5 mmol/L (ref 3.5–5.1)
Sodium: 137 mmol/L (ref 135–145)

## 2021-05-26 LAB — TYPE AND SCREEN
ABO/RH(D): A POS
Antibody Screen: NEGATIVE
Unit division: 0

## 2021-05-26 LAB — GLUCOSE, CAPILLARY
Glucose-Capillary: 102 mg/dL — ABNORMAL HIGH (ref 70–99)
Glucose-Capillary: 83 mg/dL (ref 70–99)
Glucose-Capillary: 102 mg/dL — ABNORMAL HIGH (ref 70–99)
Glucose-Capillary: 89 mg/dL (ref 70–99)

## 2021-05-26 LAB — PROTIME-INR
INR: 3.1 — ABNORMAL HIGH (ref 0.8–1.2)
Prothrombin Time: 32.2 seconds — ABNORMAL HIGH (ref 11.4–15.2)

## 2021-05-26 LAB — MAGNESIUM: Magnesium: 1.9 mg/dL (ref 1.7–2.4)

## 2021-05-26 MED ORDER — AMLODIPINE BESYLATE 5 MG PO TABS
5.0000 mg | ORAL_TABLET | Freq: Every day | ORAL | Status: DC
  Administered 2021-05-26 – 2021-05-28 (×2): 5 mg via ORAL
  Filled 2021-05-26 (×3): qty 1

## 2021-05-26 NOTE — Plan of Care (Signed)
  Problem: Education: Goal: Knowledge of General Education information will improve Description: Including pain rating scale, medication(s)/side effects and non-pharmacologic comfort measures Outcome: Progressing   Problem: Safety: Goal: Ability to remain free from injury will improve Outcome: Progressing   Problem: Skin Integrity: Goal: Risk for impaired skin integrity will decrease Outcome: Progressing   

## 2021-05-26 NOTE — Care Management Important Message (Signed)
Important Message  Patient Details  Name: [name] [name] MRN: [id number] Date of Birth: 1940-12-03   Medicare Important Message Given:  Yes     Leighton Luster Montine Circle 05/26/2021, 3:56 PM

## 2021-05-26 NOTE — Progress Notes (Signed)
[facility] NEPHROLOGY PROGRESS NOTE  Assessment/ Plan: Pt is a 80 y.o. yo male  with medical history of hypertension, COPD, CAD status post CABG, A. fib on anticoagulation, CHF, CKD 4 who was presented to the ER for abnormal lab results (sent by his urologist) seen as a consultation for the evaluation of AKI on CKD and hyperkalemia.  #Acute kidney injury on CKD stage IV likely due to ischemic ATN due to dehydration/decreased oral intake concomitant with the use of Entresto, Aldactone and Jardiance in atrophic left kidney/bilateral renal artery stenosis. Bladder scan and kidney ultrasound without obstruction, has left renal atrophy.  UA with blood and around 1.9 g of proteinuria.   Treated with IV fluid and insertion of Foley catheter.   He had some initial improvement but still has a very low GFR Now he has some uremic symptoms, especially with anorexia Offered initiation of dialysis to try to address these things, especially since urine output appears to be down He is amenable, we will ask IR to assist with placing TDC and initiating dialysis thereafter I do not think further IV fluids be helpful here Continue to hold Jardiance, Entresto and Aldactone.     #Hyperkalemia: Despite use of 3 times daily Lokelma his potassium remains towards the high side of normal.  Continue for now.  As above.  #Hypertension: Trend.  Continue carvedilol and hydralazine.     #Hematuria: Seen by urologist and concern about possible right renal pelvis mass.  Recommending outpt eval. Per Urology.     #Anemia due to blood loss/hematuria and chronic illness/CKD: Transfuse as needed. For now, no ESA because of possible pelvic mass.    #History of CHF: not overloaded, remain vigilant.  Holding cardiac medication because of AKI.  Subjective:  No interval events Poor oral intake, anorexia Creatinine really did not improve very much is 9.1 today, only 0.4 L urine output recorded K 5.0 on TID  Lokelma  Objective Vital signs in last 24 hours: Vitals:   05/26/21 0604 05/26/21 0812 05/26/21 1014 05/26/21 1353  BP: (!) 166/53  (!) 178/57   Pulse: 60  (!) 59   Resp: 20  18   Temp: 98 F (36.7 C)  98.1 F (36.7 C)   TempSrc: Oral  Oral   SpO2: 95% 92% 96% 97%  Weight:      Height:       Weight change:   Intake/Output Summary (Last 24 hours) at 05/26/2021 1519 Last data filed at 05/26/2021 1114 Gross per 24 hour  Intake 624 ml  Output 800 ml  Net -176 ml        Labs: Basic Metabolic Panel: Recent Labs  Lab 05/23/21 0521 05/23/21 1518 05/24/21 0121 05/24/21 1014 05/24/21 1732 05/25/21 0729 05/26/21 0058  NA 136   < > 136 135 137 137 137  K 5.9*   < > 6.2* 6.2* 5.8* 5.1 5.0  CL 104   < > 106 106 108 108 109  CO2 22   < > 21* 20* 19* 19* 18*  GLUCOSE 59*   < > 89 81 130* 94 96  BUN 94*   < > 89* 90* 91* 86* 87*  CREATININE 10.52*   < > 9.52* 9.60* 9.21* 9.12* 9.09*  CALCIUM 9.4   < > 8.4* 8.4* 8.2* 8.1* 8.3*  PHOS 4.8*  --  5.3* 5.3*  --   --   --    < > = values in this interval not displayed.  Liver Function Tests: Recent Labs  Lab 05/23/21 0030 05/23/21 0521 05/24/21 1014  AST 14* 13*  --   ALT 9 7  --   ALKPHOS 38 33*  --   BILITOT 0.6 0.6  --   PROT 6.0* 5.3*  --   ALBUMIN 3.0* 2.8* 2.5*    No results for input(s): LIPASE, AMYLASE in the last 168 hours. No results for input(s): AMMONIA in the last 168 hours. CBC: Recent Labs  Lab 05/23/21 0030 05/23/21 0521 05/24/21 0121 05/25/21 0729 05/25/21 1418 05/26/21 0058  WBC 10.3 9.8 9.1 10.5  --  11.4*  NEUTROABS 7.7  --   --   --   --   --   HGB 8.2* 7.6* 7.0* 7.0* 6.6* 8.5*  HCT 25.1* 22.7* 20.5* 21.5* 19.9* 25.4*  MCV 95.8 95.0 94.0 96.0  --  95.1  PLT 337 279 264 265  --  271    Cardiac Enzymes: No results for input(s): CKTOTAL, CKMB, CKMBINDEX, TROPONINI in the last 168 hours. CBG: Recent Labs  Lab 05/25/21 1143 05/25/21 1619 05/25/21 2241 05/26/21 0112 05/26/21 1222   GLUCAP 101* 94 103* 102* 83     Iron Studies:  Recent Labs    05/24/21 0121  IRON 44*  TIBC 189*  FERRITIN 129    Studies/Results: No results found.  Medications: Infusions:  sodium chloride 75 mL/hr at 05/25/21 1544    Scheduled Medications:  sodium chloride   Intravenous Once   amLODipine  5 mg Oral Daily   budesonide  0.25 mg Nebulization BID   carvedilol  9.375 mg Oral BID   Chlorhexidine Gluconate Cloth  6 each Topical Daily   feeding supplement  237 mL Oral BID BM   ferrous sulfate  325 mg Oral Q breakfast   hydrALAZINE  50 mg Oral TID   insulin aspart  0-5 Units Subcutaneous QHS   insulin aspart  0-6 Units Subcutaneous TID WC   ipratropium-albuterol  3 mL Nebulization TID   levothyroxine  112 mcg Oral Q0600   multivitamin with minerals  1 tablet Oral Daily   pantoprazole  40 mg Oral BID   pravastatin  40 mg Oral q1800   sodium bicarbonate  650 mg Oral TID   sodium zirconium cyclosilicate  10 g Oral TID   warfarin  2.5 mg Oral q1600   Warfarin - Physician Dosing Inpatient   Does not apply q1600    have reviewed scheduled and prn medications.  Physical Exam: General:NAD, comfortable Heart:RRR, s1s2 nl Lungs:clear b/l, no crackle Abdomen:soft, Non-tender, non-distended Extremities:No edema Neurology: Alert awake and following commands.,  No clear asterixis  Rexene Agent 05/26/2021,3:19 PM  LOS: 3 days

## 2021-05-26 NOTE — Progress Notes (Signed)
PROGRESS NOTE    BRANNDON TUITE  TDD:[id number] DOB: [birth year] DOA: 05/22/2021 PCP: Claretta Fraise, MD   Brief Narrative:  This 80 years old male with PMH significant for CAD s/p CABG, atrial fibrillation, CHF, AICD, COPD, CKD stage IIIb, presented in the ED after he was sent from PCP to go to the ED for abnormal labs.  He was found to have hyperkalemia and AKI.  CT abdomen and pelvis showed mild right hydronephrosis, Nephrology and urology was consulted.  Patient was given calcium gluconate, insulin with dextrose, albuterol and Lokelma for hyperkalemia.  May require cystoscopy,  bilateral retrograde ureteroscopy or possible biopsy to rule out neoplasm.  Assessment & Plan:   Principal Problem:   Acute kidney injury superimposed on CKD ([place]) Active Problems:   Essential hypertension   Normocytic anemia   Chronic systolic CHF (congestive heart failure) (HCC)   Asthma with COPD ([place])   Hyperlipemia   Coronary atherosclerosis of native coronary artery   Hypothyroidism   ICD (implantable cardioverter-defibrillator) in place   Hyperkalemia   Hyperphosphatemia   Hyponatremia   Hypoalbuminemia   Hypoglycemia due to insulin   AKI on CKD stage IV: Baseline creatinine 2.1-2.5, presented with serum creatinine 10.49 Could be multifactorial, likely ATN due to dehydration, use of medications, Entresto, Aldactone and Jardiance. Bladder scan without obstruction.  UA shows proteinuria and hematuria. Hematuria resolved.  Avoid nephrotoxic medications, hypotension and dehydration. Continue IV hydration.  Monitor serum creatinine.   Serum creatinine slightly improving 10.4 > 9.64 > 9.59 > 9.21 > 9.12 >9.09 Nephrology consulted.  Hold Jardiance, Entresto and Aldactone. If there is no improvement there is high likelihood he might need dialysis.  Hyperkalemia: > Improved. Potassium was 6.9,  Patient has received calcium gluconate, insulin, dextrose, albuterol, Lokelma. No EKG changes.  Potassium  remained elevated 6.2  Continue Lokelma daily until potassium improves. Consider dose of Lasix that will help potassium excretion. Potassium is trending down 6.9> 6.2> 5.1 >5.0   Hyperphosphatemia: > Improved This is probably secondary to CKD stage IV.  Hyponatremia: Improved  CAD s/p AICD: Stable, last device check was 04/02/21 Continue statin and Coreg,  Patient follows with vascular surgery for carotid artery stenosis.  Hypoalbuminemia: Due to moderate protein calorie malnutrition Continue protein supplements.  Normocytic anemia : Patient has anemia of chronic disease Hemoglobin 7.6 > 7.0 >7.0 > 6.6 > s/p 1PRBC > 8.5 Patient had recent UTI with hematuria.  Hematuria has resolved Transfuse if Hb drops below 7.   CHF: Continue Coreg, Hydralazine Entresto and spironolactone keep on hold due to hyperkalemia.  Hypothyroidism:  Continue Synthroid  Asthma with COPD: Continue DuoNeb, Pulmicort and albuterol  GERD :  Continue Protonix  Essential hypertension :   Continue Coreg and hydralazine. Added amlodipine 5 mg daily  Atrial fibrillation: Heart rate controlled, continue Coumadin  Type 2 diabetes: Continue regular insulin sliding scale and hypoglycemia protocol.  Hematuria: Resolved. Urology recommended outpatient work-up  DVT prophylaxis: coumadin Code Status: Full code Family Communication: (No family at bedside) Disposition Plan:   Status is: Inpatient  Remains inpatient appropriate because:Inpatient level of care appropriate due to severity of illness  Dispo: The patient is from: Home              Anticipated d/c is to: Home              Patient currently is not medically stable to d/c.   Difficult to place patient No   Consultants:  Nephrology and urology  Procedures:  Antimicrobials:   Anti-infectives (From admission, onward)    None       Subjective: Patient was seen and examined at bedside.  Overnight events noted. He reports feeling  better.  Hematuria has resolved.   He complains of left arm swelling, denies any pain.  S/p 1 PRBC> Hb 8.5  Objective: Vitals:   05/25/21 2332 05/26/21 0604 05/26/21 0812 05/26/21 1014  BP: (!) 159/59 (!) 166/53  (!) 178/57  Pulse: 64 60  (!) 59  Resp: 20 20  18   Temp: 98.5 F (36.9 C) 98 F (36.7 C)  98.1 F (36.7 C)  TempSrc: Oral Oral  Oral  SpO2: 96% 95% 92% 96%  Weight:      Height:        Intake/Output Summary (Last 24 hours) at 05/26/2021 1136 Last data filed at 05/26/2021 1114 Gross per 24 hour  Intake 624 ml  Output 800 ml  Net -176 ml   Filed Weights   05/23/21 0003 05/23/21 1345  Weight: 85 kg 75.5 kg    Examination:  General exam: Appears comfortable, not in any acute distress. Respiratory system: Clear to auscultation bilaterally, respiratory rate normal. Cardiovascular system: S1-S2 heard, regular rate and rhythm, no murmur, AICD noted in left anterior chest Gastrointestinal system: Abdomen is soft, nontender nondistended, normal bowel sounds. Central nervous system: Alert and oriented x3, no focal neurological deficits. Extremities: 2+ pitting edema, no clubbing.  Left arm swelling with some erythema around the needle. skin: No rashes, lesions or ulcers Psychiatry: Judgement and insight appear normal. Mood & affect appropriate.   Data Reviewed: I have personally reviewed following labs and imaging studies  CBC: Recent Labs  Lab 05/23/21 0030 05/23/21 0521 05/24/21 0121 05/25/21 0729 05/25/21 1418 05/26/21 0058  WBC 10.3 9.8 9.1 10.5  --  11.4*  NEUTROABS 7.7  --   --   --   --   --   HGB 8.2* 7.6* 7.0* 7.0* 6.6* 8.5*  HCT 25.1* 22.7* 20.5* 21.5* 19.9* 25.4*  MCV 95.8 95.0 94.0 96.0  --  95.1  PLT 337 279 264 265  --  119   Basic Metabolic Panel: Recent Labs  Lab 05/23/21 0521 05/23/21 1518 05/24/21 0121 05/24/21 1014 05/24/21 1732 05/25/21 0729 05/26/21 0058  NA 136   < > 136 135 137 137 137  K 5.9*   < > 6.2* 6.2* 5.8* 5.1 5.0  CL  104   < > 106 106 108 108 109  CO2 22   < > 21* 20* 19* 19* 18*  GLUCOSE 59*   < > 89 81 130* 94 96  BUN 94*   < > 89* 90* 91* 86* 87*  CREATININE 10.52*   < > 9.52* 9.60* 9.21* 9.12* 9.09*  CALCIUM 9.4   < > 8.4* 8.4* 8.2* 8.1* 8.3*  MG 2.3  --  2.1  --   --  2.1 1.9  PHOS 4.8*  --  5.3* 5.3*  --   --   --    < > = values in this interval not displayed.   GFR: Estimated Creatinine Clearance: 6.4 mL/min (A) (by C-G formula based on SCr of 9.09 mg/dL (H)). Liver Function Tests: Recent Labs  Lab 05/23/21 0030 05/23/21 0521 05/24/21 1014  AST 14* 13*  --   ALT 9 7  --   ALKPHOS 38 33*  --   BILITOT 0.6 0.6  --   PROT 6.0* 5.3*  --   ALBUMIN 3.0* 2.8* 2.5*  No results for input(s): LIPASE, AMYLASE in the last 168 hours. No results for input(s): AMMONIA in the last 168 hours. Coagulation Profile: Recent Labs  Lab 05/21/21 1444 05/23/21 0521 05/24/21 0859 05/25/21 0729 05/26/21 0058  INR 2.3* 2.7* 2.6* 3.0* 3.1*   Cardiac Enzymes: No results for input(s): CKTOTAL, CKMB, CKMBINDEX, TROPONINI in the last 168 hours. BNP (last 3 results) No results for input(s): PROBNP in the last 8760 hours. HbA1C: No results for input(s): HGBA1C in the last 72 hours.  CBG: Recent Labs  Lab 05/25/21 0645 05/25/21 1143 05/25/21 1619 05/25/21 2241 05/26/21 0112  GLUCAP 90 101* 94 103* 102*   Lipid Profile: No results for input(s): CHOL, HDL, LDLCALC, TRIG, CHOLHDL, LDLDIRECT in the last 72 hours. Thyroid Function Tests: No results for input(s): TSH, T4TOTAL, FREET4, T3FREE, THYROIDAB in the last 72 hours. Anemia Panel: Recent Labs    05/24/21 0121  FERRITIN 129  TIBC 189*  IRON 44*   Sepsis Labs: No results for input(s): PROCALCITON, LATICACIDVEN in the last 168 hours.  Recent Results (from the past 240 hour(s))  Microscopic Examination     Status: Abnormal   Collection Time: 05/18/21  3:01 PM   Urine  Result Value Ref Range Status   WBC, UA 6-10 (A) 0 - 5 /hpf Final    RBC >30 (A) 0 - 2 /hpf Final   Epithelial Cells (non renal) None seen 0 - 10 /hpf Final   Renal Epithel, UA None seen None seen /hpf Final   Bacteria, UA Few (A) None seen/Few Final  Urine Culture     Status: None   Collection Time: 05/18/21  3:26 PM   Specimen: Urine   UR  Result Value Ref Range Status   Urine Culture, Routine Final report  Final   Organism ID, Bacteria No growth  Final  SARS CORONAVIRUS 2 (TAT 6-24 HRS) Nasopharyngeal Nasopharyngeal Swab     Status: None   Collection Time: 05/23/21  5:46 AM   Specimen: Nasopharyngeal Swab  Result Value Ref Range Status   SARS Coronavirus 2 NEGATIVE NEGATIVE Final    Comment: (NOTE) SARS-CoV-2 target nucleic acids are NOT DETECTED.  The SARS-CoV-2 RNA is generally detectable in upper and lower respiratory specimens during the acute phase of infection. Negative results do not preclude SARS-CoV-2 infection, do not rule out co-infections with other pathogens, and should not be used as the sole basis for treatment or other patient management decisions. Negative results must be combined with clinical observations, patient history, and epidemiological information. The expected result is Negative.  Fact Sheet for Patients: SugarRoll.be  Fact Sheet for Healthcare Providers: https://www.woods-mathews.com/  This test is not yet approved or cleared by the [place] FDA and  has been authorized for detection and/or diagnosis of SARS-CoV-2 by FDA under an Emergency Use Authorization (EUA). This EUA will remain  in effect (meaning this test can be used) for the duration of the COVID-19 declaration under Se ction 564(b)(1) of the Act, 21 U.S.C. section 360bbb-3(b)(1), unless the authorization is terminated or revoked sooner.  Performed at [facility], [place] [address], [place], [place] [address]   MRSA Next Gen by PCR, Nasal     Status: None   Collection Time: 05/23/21  4:02 PM    Specimen: Urine, Catheterized; Nasal Swab  Result Value Ref Range Status   MRSA by PCR Next Gen NOT DETECTED NOT DETECTED Final    Comment: (NOTE) The GeneXpert MRSA Assay (FDA approved for NASAL specimens only),  is one component of a comprehensive MRSA colonization surveillance program. It is not intended to diagnose MRSA infection nor to guide or monitor treatment for MRSA infections. Test performance is not FDA approved in patients less than 62 years old. Performed at [facility], [place] [address], [place], [place] [address]     Radiology Studies: No results found.  Scheduled Meds:  sodium chloride   Intravenous Once   amLODipine  5 mg Oral Daily   budesonide  0.25 mg Nebulization BID   carvedilol  9.375 mg Oral BID   Chlorhexidine Gluconate Cloth  6 each Topical Daily   feeding supplement  237 mL Oral BID BM   ferrous sulfate  325 mg Oral Q breakfast   hydrALAZINE  50 mg Oral TID   insulin aspart  0-5 Units Subcutaneous QHS   insulin aspart  0-6 Units Subcutaneous TID WC   ipratropium-albuterol  3 mL Nebulization TID   levothyroxine  112 mcg Oral Q0600   multivitamin with minerals  1 tablet Oral Daily   pantoprazole  40 mg Oral BID   pravastatin  40 mg Oral q1800   sodium bicarbonate  650 mg Oral TID   sodium zirconium cyclosilicate  10 g Oral TID   warfarin  2.5 mg Oral q1600   Warfarin - Physician Dosing Inpatient   Does not apply q1600   Continuous Infusions:  sodium chloride 75 mL/hr at 05/25/21 1544     LOS: 3 days    Time spent: 25 mins.    Shawna Clamp, MD [facility]   If 7PM-7AM, please contact night-coverage

## 2021-05-27 ENCOUNTER — Inpatient Hospital Stay (HOSPITAL_COMMUNITY): Payer: Medicare Other

## 2021-05-27 ENCOUNTER — Ambulatory Visit: Payer: Medicare Other | Admitting: Family Medicine

## 2021-05-27 ENCOUNTER — Ambulatory Visit (HOSPITAL_COMMUNITY): Payer: Medicare Other

## 2021-05-27 DIAGNOSIS — N189 Chronic kidney disease, unspecified: Secondary | ICD-10-CM | POA: Diagnosis not present

## 2021-05-27 DIAGNOSIS — M7989 Other specified soft tissue disorders: Secondary | ICD-10-CM

## 2021-05-27 DIAGNOSIS — N179 Acute kidney failure, unspecified: Secondary | ICD-10-CM | POA: Diagnosis not present

## 2021-05-27 HISTORY — PX: IR FLUORO GUIDE CV LINE RIGHT: IMG2283

## 2021-05-27 HISTORY — PX: IR US GUIDE VASC ACCESS RIGHT: IMG2390

## 2021-05-27 LAB — BASIC METABOLIC PANEL
Anion gap: 12 (ref 5–15)
BUN: 93 mg/dL — ABNORMAL HIGH (ref 8–23)
CO2: 19 mmol/L — ABNORMAL LOW (ref 22–32)
Calcium: 8.2 mg/dL — ABNORMAL LOW (ref 8.9–10.3)
Chloride: 107 mmol/L (ref 98–111)
Creatinine, Ser: 9.48 mg/dL — ABNORMAL HIGH (ref 0.61–1.24)
GFR, Estimated: 5 mL/min — ABNORMAL LOW (ref >60)
Glucose, Bld: 82 mg/dL (ref 70–99)
Potassium: 4.5 mmol/L (ref 3.5–5.1)
Sodium: 138 mmol/L (ref 135–145)

## 2021-05-27 LAB — CBC
HCT: 23.4 % — ABNORMAL LOW (ref 39.0–52.0)
Hemoglobin: 7.6 g/dL — ABNORMAL LOW (ref 13.0–17.0)
MCH: 31.1 pg (ref 26.0–34.0)
MCHC: 32.5 g/dL (ref 30.0–36.0)
MCV: 95.9 fL (ref 80.0–100.0)
Platelets: 237 10*3/uL (ref 150–400)
RBC: 2.44 MIL/uL — ABNORMAL LOW (ref 4.22–5.81)
RDW: 13.7 % (ref 11.5–15.5)
WBC: 11.2 10*3/uL — ABNORMAL HIGH (ref 4.0–10.5)
nRBC: 0 % (ref 0.0–0.2)

## 2021-05-27 LAB — GLUCOSE, CAPILLARY
Glucose-Capillary: 75 mg/dL (ref 70–99)
Glucose-Capillary: 84 mg/dL (ref 70–99)
Glucose-Capillary: 86 mg/dL (ref 70–99)
Glucose-Capillary: 99 mg/dL (ref 70–99)

## 2021-05-27 LAB — PROTIME-INR
INR: 3.7 — ABNORMAL HIGH (ref 0.8–1.2)
Prothrombin Time: 36.5 seconds — ABNORMAL HIGH (ref 11.4–15.2)

## 2021-05-27 LAB — HEPATITIS B CORE ANTIBODY, TOTAL: Hep B Core Total Ab: NONREACTIVE

## 2021-05-27 LAB — MAGNESIUM: Magnesium: 2 mg/dL (ref 1.7–2.4)

## 2021-05-27 LAB — HEPATITIS B SURFACE ANTIGEN: Hepatitis B Surface Ag: NONREACTIVE

## 2021-05-27 MED ORDER — ALTEPLASE 2 MG IJ SOLR: 2.0000 mg | Freq: Once | INTRAMUSCULAR | Status: DC | PRN

## 2021-05-27 MED ORDER — "THROMBI-PAD 3""X3"" EX PADS"
1.0000 | MEDICATED_PAD | Freq: Once | CUTANEOUS | Status: AC
  Administered 2021-05-28: 1 via TOPICAL
  Filled 2021-05-27: qty 1

## 2021-05-27 MED ORDER — SODIUM ZIRCONIUM CYCLOSILICATE 10 G PO PACK
10.0000 g | PACK | Freq: Every day | ORAL | Status: DC
  Administered 2021-05-28: 10 g via ORAL
  Filled 2021-05-27: qty 1

## 2021-05-27 MED ORDER — KIDNEY FAILURE BOOK: Freq: Once | Status: AC

## 2021-05-27 MED ORDER — HEPARIN SODIUM (PORCINE) 1000 UNIT/ML IJ SOLN
INTRAMUSCULAR | Status: AC
  Administered 2021-05-27: 3 mL
  Filled 2021-05-27: qty 1

## 2021-05-27 MED ORDER — SODIUM CHLORIDE 0.9% FLUSH
10.0000 mL | INTRAVENOUS | Status: DC | PRN
  Administered 2021-05-28: 10 mL

## 2021-05-27 MED ORDER — IPRATROPIUM-ALBUTEROL 0.5-2.5 (3) MG/3ML IN SOLN: 3.0000 mL | Freq: Two times a day (BID) | RESPIRATORY_TRACT | Status: DC

## 2021-05-27 MED ORDER — IPRATROPIUM-ALBUTEROL 0.5-2.5 (3) MG/3ML IN SOLN
3.0000 mL | Freq: Two times a day (BID) | RESPIRATORY_TRACT | Status: DC
  Administered 2021-05-28 – 2021-06-05 (×14): 3 mL via RESPIRATORY_TRACT
  Filled 2021-05-27 (×17): qty 3

## 2021-05-27 MED ORDER — SODIUM CHLORIDE 0.9 % IV SOLN: 100.0000 mL | INTRAVENOUS | Status: DC | PRN

## 2021-05-27 MED ORDER — LIDOCAINE HCL (PF) 1 % IJ SOLN
INTRAMUSCULAR | Status: AC | PRN
Start: 2021-05-27 — End: 2021-05-27
  Administered 2021-05-27: 10 mL

## 2021-05-27 MED ORDER — LIDOCAINE HCL 1 % IJ SOLN
INTRAMUSCULAR | Status: AC
  Filled 2021-05-27: qty 20

## 2021-05-27 NOTE — Plan of Care (Signed)

## 2021-05-27 NOTE — Progress Notes (Signed)
IR contacted regarding oozing from RIJ temporary dialysis catheter site. INR is 3.7. IR recommends to keep patient sitting upright, hold IJ pressure, and/or place a thrombi-pad over the site. Patient scheduled for HD this afternoon. Patient may need coumadin held if the oozing doesn't stop but will defer to primary team.  Soyla Dryer, AGACNP-BC [phone] 05/27/2021, 4:34 PM

## 2021-05-27 NOTE — Progress Notes (Signed)
   Patient Status: [facility] - In-pt  Assessment and Plan: Patient in need of venous access; temporary dialysis catheter to be placed today in IR.    ______________________________________________________________________   History of Present Illness: [name] [name] is an 80 y.o. male with a medical history significant for HTN, COPD, CAD s/p CABG, atrial fibrillation on anticoagulation, CHF, and CKD. He presented to the ED at the request of his urologist due to abnormal labs. Nephrology was consulted for evaluation of AKI on CKD and hyperkalemia. The patient was treated with IV fluids and had several home medications withheld Delene Loll, Jardiance and Aldactone) but has not had significant renal recovery. Interventional Radiology has been asked to place a temporary dialysis catheter for hemodialysis.   Allergies and medications reviewed.   Review of Systems: A 12 point ROS discussed and pertinent positives are indicated in the HPI above.  All other systems are negative.  Review of Systems  Constitutional:  Positive for appetite change and fatigue.  Respiratory:  Negative for cough and shortness of breath.   Gastrointestinal:  Negative for abdominal pain, diarrhea, nausea and vomiting.  Neurological:  Negative for headaches.   Vital Signs: BP (!) 159/59 (BP Location: Right Arm)   Pulse 60   Temp 97.9 F (36.6 C)   Resp 16   Ht 5\' 9"  (1.753 m)   Wt 166 lb 8 oz (75.5 kg)   SpO2 95%   BMI 24.59 kg/m   Physical Exam Constitutional:      General: He is not in acute distress.    Appearance: He is ill-appearing.  Pulmonary:     Effort: Pulmonary effort is normal.  Neurological:     Mental Status: He is alert and oriented to person, place, and time.     Imaging reviewed.   Labs:  COAGS: Recent Labs    05/23/21 0521 05/24/21 0859 05/25/21 0729 05/26/21 0058 05/27/21 0343  INR 2.7* 2.6* 3.0* 3.1* 3.7*  APTT 61*  --   --   --   --     BMP: Recent Labs    09/09/20 0930  10/23/20 1200 05/24/21 1732 05/25/21 0729 05/26/21 0058 05/27/21 0343  NA 141   < > 137 137 137 138  K 4.7   < > 5.8* 5.1 5.0 4.5  CL 105   < > 108 108 109 107  CO2 22   < > 19* 19* 18* 19*  GLUCOSE 93   < > 130* 94 96 82  BUN 40*   < > 91* 86* 87* 93*  CALCIUM 9.0   < > 8.2* 8.1* 8.3* 8.2*  CREATININE 2.05*   < > 9.21* 9.12* 9.09* 9.48*  GFRNONAA 30*   < > 5* 5* 5* 5*  GFRAA 34*  --   --   --   --   --    < > = values in this interval not displayed.       Electronically Signed: Theresa Duty, NP 05/27/2021, 8:52 AM   I spent a total of 15 minutes in face to face in clinical consultation, greater than 50% of which was counseling/coordinating care for venous access.

## 2021-05-27 NOTE — Progress Notes (Signed)
PROGRESS NOTE  [name] [name]  DOB: 1941/04/09  PCP: Claretta Fraise, MD DDU:[id number]  DOA: 05/22/2021  LOS: 4 days  Hospital Day: 6  Chief complaint: Abnormal labs  Brief narrative: [name] [name] is a 80 y.o. male with PMH significant for CAD s/p CABG, atrial fibrillation, CHF, AICD, COPD, CKD stage IIIb. Patient presented to the ED on 8/19 from his PCPs office for abnormal labs He had elevated potassium and creatinine. CT abdomen and pelvis showed mild right hydronephrosis. Admitted to hospitalist service.  Nephrology and urology was consulted.    Subjective: Patient was seen and examined this morning.  Not in distress  Assessment/Plan: New ESRD -Presented as AKI on CKD stage IV -Baseline creatinine 2.1-2.5, presented with serum creatinine 10.49 -Could be multifactorial, likely ATN due to dehydration, use of medications, Entresto, Aldactone and Jardiance. -Bladder scan without obstruction.   -UA shows proteinuria and hematuria.  Hematuria resolved. -Nephrology consult appreciated. -8/24, patient underwent triple-lumen catheter placement for initiation of dialysis.  Hyperkalemia/hyperphosphatemia -Potassium on admission was elevated to 6.9 without EKG changes.  Received calcium chloride, insulin, dextrose, albuterol, Lokelma. -Continue Lokelma daily Recent Labs  Lab 05/23/21 0521 05/23/21 1518 05/24/21 0121 05/24/21 1014 05/24/21 1732 05/25/21 0729 05/26/21 0058 05/27/21 0343  K 5.9*   < > 6.2* 6.2* 5.8* 5.1 5.0 4.5  MG 2.3  --  2.1  --   --  2.1 1.9 2.0  PHOS 4.8*  --  5.3* 5.3*  --   --   --   --    < > = values in this interval not displayed.   Moderate protein calorie malnutrition Hypoalbuminemia -Continue protein supplements.  CHF Essential hypertension -Continue Coreg, Hydralazine and amlodipine -Entresto and spironolactone on hold due to hyperkalemia.  CAD s/p AICD: -Continue Coreg and statin. last device check was 04/02/21  A.  Fib Supratherapeutic INR -Heart rate controlled on Coreg.  On Coumadin -Pharmacy to dose Coumadin. Recent Labs  Lab 05/23/21 0521 05/24/21 0859 05/25/21 0729 05/26/21 0058 05/27/21 0343  INR 2.7* 2.6* 3.0* 3.1* 3.7*   Carotid artery stenosis -Follows with vascular surgery.    Acute on chronic anemia -Hemoglobin as low as 6.6.  1 unit of PRBC transfused.  No active bleeding. Hematuria has resolved. Recent Labs    10/23/20 1200 05/13/21 1325 05/24/21 0121 05/25/21 0729 05/25/21 1418 05/26/21 0058 05/27/21 0343  HGB  --    < > 7.0* 7.0* 6.6* 8.5* 7.6*  MCV  --    < > 94.0 96.0  --  95.1 95.9  VITAMINB12 464  --   --   --   --   --   --   FERRITIN  --   --  129  --   --   --   --   TIBC  --   --  189*  --   --   --   --   IRON  --   --  44*  --   --   --   --    < > = values in this interval not displayed.    Hypothyroidism -Continue Synthroid   Asthma with COPD -Continue DuoNeb, Pulmicort and albuterol   GERD -Continue Protonix    Hematuria: Resolved. -Urology recommended outpatient work-up  Mobility: Encourage ambulation Code Status:   Code Status: Full Code  Nutritional status: Body mass index is 24.59 kg/m. Nutrition Problem: Increased nutrient needs Etiology: acute illness (AKI) Signs/Symptoms: estimated needs Diet:  Diet Order  Diet Heart Room service appropriate? Yes; Fluid consistency: Thin  Diet effective now                  DVT prophylaxis:  SCDs Start: 05/23/21 2725   Antimicrobials: None Fluid: None Consultants: Nephrology Family Communication: Wife at bedside  Status is: Inpatient  Remains inpatient appropriate because: Needs dialysis initiation  Dispo: The patient is from: Home              Anticipated d/c is to: Likely home              Patient currently is not medically stable to d/c.   Difficult to place patient No     Infusions:    Scheduled Meds:  sodium chloride   Intravenous Once   amLODipine  5  mg Oral Daily   budesonide  0.25 mg Nebulization BID   carvedilol  9.375 mg Oral BID   Chlorhexidine Gluconate Cloth  6 each Topical Daily   feeding supplement  237 mL Oral BID BM   ferrous sulfate  325 mg Oral Q breakfast   hydrALAZINE  50 mg Oral TID   insulin aspart  0-5 Units Subcutaneous QHS   insulin aspart  0-6 Units Subcutaneous TID WC   ipratropium-albuterol  3 mL Nebulization BID   levothyroxine  112 mcg Oral Q0600   lidocaine       multivitamin with minerals  1 tablet Oral Daily   pantoprazole  40 mg Oral BID   pravastatin  40 mg Oral q1800   [START ON 05/28/2021] sodium zirconium cyclosilicate  10 g Oral Daily    Antimicrobials: Anti-infectives (From admission, onward)    None       PRN meds: acetaminophen, albuterol, dextromethorphan-guaiFENesin, lidocaine (PF), senna-docusate, traZODone   Objective: Vitals:   05/27/21 0614 05/27/21 0738  BP: (!) 159/59   Pulse: 60 60  Resp: 18 16  Temp: 97.9 F (36.6 C)   SpO2: 96% 95%    Intake/Output Summary (Last 24 hours) at 05/27/2021 1458 Last data filed at 05/27/2021 1300 Gross per 24 hour  Intake 490 ml  Output 150 ml  Net 340 ml   Filed Weights   05/23/21 0003 05/23/21 1345  Weight: 85 kg 75.5 kg   Weight change:  Body mass index is 24.59 kg/m.   Physical Exam: General exam: Pleasant, elderly.  Not in distress Skin: No rashes, lesions or ulcers. HEENT: Atraumatic, normocephalic, no obvious bleeding Lungs: Clear to auscultation bilaterally CVS: Regular rate and rhythm, no murmur GI/Abd soft, nontender, nondistended, bowel sound present CNS: Alert, awake,  Psychiatry: Mood appropriate Extremities: No pedal edema, no calf tenderness  Data Review: I have personally reviewed the laboratory data and studies available.  Recent Labs  Lab 05/23/21 0030 05/23/21 0521 05/24/21 0121 05/25/21 0729 05/25/21 1418 05/26/21 0058 05/27/21 0343  WBC 10.3 9.8 9.1 10.5  --  11.4* 11.2*  NEUTROABS 7.7  --    --   --   --   --   --   HGB 8.2* 7.6* 7.0* 7.0* 6.6* 8.5* 7.6*  HCT 25.1* 22.7* 20.5* 21.5* 19.9* 25.4* 23.4*  MCV 95.8 95.0 94.0 96.0  --  95.1 95.9  PLT 337 279 264 265  --  271 237   Recent Labs  Lab 05/23/21 0521 05/23/21 1518 05/24/21 0121 05/24/21 1014 05/24/21 1732 05/25/21 0729 05/26/21 0058 05/27/21 0343  NA 136   < > 136 135 137 137 137 138  K 5.9*   < >  6.2* 6.2* 5.8* 5.1 5.0 4.5  CL 104   < > 106 106 108 108 109 107  CO2 22   < > 21* 20* 19* 19* 18* 19*  GLUCOSE 59*   < > 89 81 130* 94 96 82  BUN 94*   < > 89* 90* 91* 86* 87* 93*  CREATININE 10.52*   < > 9.52* 9.60* 9.21* 9.12* 9.09* 9.48*  CALCIUM 9.4   < > 8.4* 8.4* 8.2* 8.1* 8.3* 8.2*  MG 2.3  --  2.1  --   --  2.1 1.9 2.0  PHOS 4.8*  --  5.3* 5.3*  --   --   --   --    < > = values in this interval not displayed.    F/u labs ordered Unresulted Labs (From admission, onward)     Start     Ordered   05/25/21 0500  Protime-INR  Daily,   R      05/24/21 0816   05/24/21 2878  Basic metabolic panel  Daily,   R     Question:  Specimen collection method  Answer:  Lab=Lab collect   05/23/21 0911   05/24/21 0500  CBC  Daily,   R     Question:  Specimen collection method  Answer:  Lab=Lab collect   05/23/21 0911   05/24/21 0500  Magnesium  Daily,   R     Question:  Specimen collection method  Answer:  Lab=Lab collect   05/23/21 0911            Signed, Terrilee Croak, MD [facility] 05/27/2021

## 2021-05-27 NOTE — Progress Notes (Signed)
Rounded on patient today in correlation to need for HD. Patient found lying in the bed with his wife at the bedside. Permission granted to have educational conversation regarding HD. Prior to start of conversation patient with notable oozing from his R IJ. Dressing appears to be saturated with bright red frank blood. Dressing reinforced with gauze and paper tape. Spoke with patient's RN to place new order for IV team consult while awaiting HD.   Ordered Kidney Failure book and patient educated at the bedside regarding care of his temporary catheter. Patient also educated on the effects of fluid overload, hyperkalemia and hyperphosphatemia. He reports that this is all new for him and that he never had kidney problems in the past. Patient with no further questions at this time. Will follow patient closely. Handouts and contact information provided to patient for any further assistance.   Dorthey Sawyer, RN  Dialysis Nurse Coordinator Phone: [phone]

## 2021-05-27 NOTE — Progress Notes (Signed)
Upper extremity venous has been completed.   Preliminary results in CV Proc.   Abram Sander 05/27/2021 11:57 AM

## 2021-05-27 NOTE — Plan of Care (Signed)
  Problem: Education: Goal: Knowledge of General Education information will improve Description: Including pain rating scale, medication(s)/side effects and non-pharmacologic comfort measures Outcome: Progressing   Problem: Skin Integrity: Goal: Risk for impaired skin integrity will decrease Outcome: Progressing   

## 2021-05-27 NOTE — Progress Notes (Signed)
ANTICOAGULATION CONSULT NOTE - Initial Consult  Pharmacy Consult for Warfarin Indication: atrial fibrillation  No Known Allergies  Patient Measurements: Height: 5\' 9"  (175.3 cm) Weight: 75.5 kg (166 lb 8 oz) IBW/kg (Calculated) : 70.7  Vital Signs: Temp: 97.9 F (36.6 C) (08/24 0614) BP: 159/59 (08/24 0614) Pulse Rate: 60 (08/24 0738)  Labs: Recent Labs    05/25/21 0729 05/25/21 1418 05/26/21 0058 05/27/21 0343  HGB 7.0* 6.6* 8.5* 7.6*  HCT 21.5* 19.9* 25.4* 23.4*  PLT 265  --  271 237  LABPROT 30.7*  --  32.2* 36.5*  INR 3.0*  --  3.1* 3.7*  CREATININE 9.12*  --  9.09* 9.48*    Estimated Creatinine Clearance: 6.1 mL/min (A) (by C-G formula based on SCr of 9.48 mg/dL (H)).   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    COPD (chronic obstructive pulmonary disease) (HCC)    on home oxygen, 2 liters at night10/2015   Esophageal reflux    Gallstones    Gastric antral vascular ectasia 2013   Gout    Heme positive stool    Hepatomegaly    Hiatal hernia    History of blood transfusion ~ 2012   "blood count dropped; had to get 3 units"   Hyperlipidemia    Hypothyroidism    Other and unspecified coagulation defects    Personal history of colonic polyps 05/29/2010   TUBULAR ADENOMA   Unspecified essential hypertension     Medications:  Medications Prior to Admission  Medication Sig Dispense Refill Last Dose   acetaminophen (TYLENOL) 500 MG tablet Take 500 mg by mouth every 6 (six) hours as needed for headache or mild pain.   05/22/2021   albuterol (VENTOLIN HFA) 108 (90 Base) MCG/ACT inhaler USE 2 INHALATIONS BY MOUTH  EVERY 6 HOURS AS NEEDED FOR  SHORTNESS OF BREATH (Patient taking differently: Inhale 2 puffs into the lungs every 6 (six) hours as needed for shortness of breath.) 34 g 0 05/23/2021   budesonide (PULMICORT) 0.25 MG/2ML nebulizer solution USE 1 VIAL  IN  NEBULIZER TWICE  DAILY - rinse mouth after treatment (Patient taking differently: Take 0.25 mg by nebulization See admin instructions. USE 1 VIAL  IN  NEBULIZER TWICE DAILY - rinse mouth after treatment) 6 mL 5 05/23/2021   carvedilol (COREG) 6.25 MG tablet Take 1.5 tablets (9.375 mg total) by mouth 2 (two) times daily. 270 tablet 0 05/22/2021 at 2000   Cholecalciferol (VITAMIN D3) 5000 UNITS CAPS Take 5,000 Units by mouth daily.   05/22/2021   docusate sodium (COLACE) 100 MG capsule Take 100 mg by mouth daily as needed for mild constipation.   Past Month   empagliflozin (JARDIANCE) 10 MG TABS tablet Take 1 tablet (10 mg total) by mouth daily before breakfast. 30 tablet 11 05/22/2021   ferrous sulfate 325 (65 FE) MG tablet Take 325 mg by mouth daily with breakfast.   05/22/2021   hydrALAZINE (APRESOLINE) 50 MG tablet TAKE 1 TABLET BY MOUTH 3  TIMES DAILY (Patient taking differently: Take 50 mg by mouth 3 (three) times daily.) 270 tablet 3 05/22/2021   HYDROcodone-acetaminophen (NORCO) 5-325 MG  tablet Take 2 tablets by mouth every 4 (four) hours as needed. (Patient taking differently: Take 1-2 tablets by mouth every 4 (four) hours as needed for moderate pain.) 10 tablet 0 05/22/2021   Hypromell-Glycerin-Naphazoline 0.8-0.25-0.012 % SOLN Place 1-2 drops into both eyes 3 (three) times daily as needed (for dry eyes.).   Past Month   ipratropium-albuterol (DUONEB) 0.5-2.5 (3) MG/3ML SOLN USE 1 VIAL IN NEBULIZER 4 TIMES DAILY (Patient taking differently: Take 3 mLs by nebulization 4 (four) times daily.) 9 mL 5 05/23/2021   levothyroxine (SYNTHROID) 112 MCG tablet TAKE 1 TABLET BY MOUTH  DAILY BEFORE BREAKFAST (Patient taking differently: Take 112 mcg by mouth daily before breakfast.) 90 tablet 3  05/22/2021   lovastatin (MEVACOR) 40 MG tablet Take 2 tablets (80 mg total) by mouth at bedtime. 180 tablet 3 05/22/2021   Multiple Vitamins-Minerals (MULTIVITAMINS THER. W/MINERALS) TABS Take 1 tablet by mouth daily.   05/22/2021   nitroGLYCERIN (NITROSTAT) 0.4 MG SL tablet Place 1 tablet (0.4 mg total) under the tongue every 5 (five) minutes as needed for chest pain. 6 tablet 1 unk   OXYGEN Inhale 2 L/min into the lungs See admin instructions. 2 L/min at bedtime and as needed daily as needed for shortness of breath      pantoprazole (PROTONIX) 40 MG tablet Take 1 tablet (40 mg total) by mouth 2 (two) times daily. (Patient taking differently: Take 40 mg by mouth in the morning and at bedtime.) 180 tablet 2 05/22/2021   sacubitril-valsartan (ENTRESTO) 97-103 MG Take 1 tablet by mouth 2 (two) times daily. 180 tablet 3 05/22/2021   spironolactone (ALDACTONE) 25 MG tablet Take 0.5 tablets (12.5 mg total) by mouth daily. 30 tablet 3 05/22/2021   sucralfate (CARAFATE) 1 g tablet TAKE 1 TABLET BY MOUTH 4  TIMES DAILY 1 HOUR BEFORE  MEALS AND AT BEDTIME (Patient taking differently: Take 1 g by mouth See admin instructions. Take 1 gram by mouth four times a day- ONE HOUR before meals and at bedtime) 360 tablet 1 05/22/2021   warfarin (COUMADIN) 5 MG tablet TAKE 1 TABLET BY MOUTH  EVERY MONDAY, WEDNESDAY,  AND FRIDAY AND ONE-HALF  TABLET BY MOUTH ON THE  REMAINING DAYS OF THE WEEK (Patient taking differently: Take 2.5 mg by mouth daily.) 65 tablet 0 05/22/2021 at 0900   cephALEXin (KEFLEX) 500 MG capsule Take 1 capsule (500 mg total) by mouth 4 (four) times daily. (Patient not taking: No sig reported) 28 capsule 0 Completed Course   fluticasone furoate-vilanterol (BREO ELLIPTA) 200-25 MCG/INH AEPB Inhale 1 puff into the lungs daily. (Patient not taking: Reported on 05/23/2021) 28 each 2 Not Taking   loratadine (CLARITIN) 10 MG tablet Take 10 mg by mouth daily as needed (for allergies.).    unk   Scheduled:   sodium  chloride   Intravenous Once   amLODipine  5 mg Oral Daily   budesonide  0.25 mg Nebulization BID   carvedilol  9.375 mg Oral BID   Chlorhexidine Gluconate Cloth  6 each Topical Daily   feeding supplement  237 mL Oral BID BM   ferrous sulfate  325 mg Oral Q breakfast   hydrALAZINE  50 mg Oral TID   insulin aspart  0-5 Units Subcutaneous QHS   insulin aspart  0-6 Units Subcutaneous TID WC   ipratropium-albuterol  3 mL Nebulization TID   levothyroxine  112 mcg Oral Q0600   lidocaine       multivitamin with minerals  1 tablet  Oral Daily   pantoprazole  40 mg Oral BID   pravastatin  40 mg Oral q1800   sodium zirconium cyclosilicate  10 g Oral TID    Assessment: 80yo male on warfarin PTA for Afib. Admit INR 2.7 was therapeutic on PTA regimen of warfarin 2.5 mg every day. Patient has been continuing 2.5 mg daily inpatient per physician protocol.  INR 3.7 is now above therapeutic goal and is increased from 3.1 on 8/23. Hemoglobin is down from 8.5 to 7.6, but platelets are wnl. Patient has poor PO intake.  Goal of Therapy:  INR 2-3 Monitor platelets by anticoagulation protocol: Yes   Plan:  Hold warfarin today, now per pharmacy protocol. F/u daily INR, CBC. Monitor for s/sx of bleeding.  Collene Gobble, Student Pharmacist 05/27/2021,9:26 AM

## 2021-05-27 NOTE — Procedures (Signed)
Interventional Radiology Procedure Note  Procedure: Temporary hemodialysis catheter placement  Findings: Please refer to procedural dictation for full description.12 Fr, 24 cm Trialysis catheter placed, catheter in right atrium.  Complications: None immediate  Estimated Blood Loss: < 5 mL  Recommendations: Catheter ready for immediate use.   Ruthann Cancer, MD Pager: [phone]

## 2021-05-27 NOTE — Progress Notes (Signed)
[facility] NEPHROLOGY PROGRESS NOTE  Assessment/ Plan: Pt is a 80 y.o. yo male  with medical history of hypertension, COPD, CAD status post CABG, A. fib on anticoagulation, CHF, CKD 4 who was presented to the ER for abnormal lab results (sent by his urologist) seen as a consultation for the evaluation of AKI on CKD and hyperkalemia.  #Acute kidney injury on CKD stage IV likely due to ischemic ATN due to dehydration/decreased oral intake concomitant with the use of Entresto, Aldactone and Jardiance in atrophic left kidney/bilateral renal artery stenosis. Bladder scan and kidney ultrasound without obstruction, has left renal atrophy.  UA with blood and around 1.9 g of proteinuria.     Persistent very low GFR with uremic symptoms, to start dialysis Received temporary HD cath.  8/24, will need tunneled catheter when INR is permissive Unclear if will recover GFR in time, continue to quantify UOP, trend labs Continue to hold Jardiance, Entresto and Aldactone.   Plan for dialysis today, abbreviated treatment, 2K bath, no UF  #Hyperkalemia: Once on stable dialysis schedule will be able to discontinue Lokelma, continue for the time being but reduce to once daily dosing  #Hypertension: Trend.  Continue carvedilol and hydralazine.     #Hematuria: Seen by urologist and concern about possible right renal pelvis mass.  Recommending outpt eval. Per Urology.     #Anemia due to blood loss/hematuria and chronic illness/CKD: Transfuse as needed. For now, no ESA because of possible pelvic mass.    #History of CHF: not overloaded.  Holding cardiac meds because of AKI.  Subjective:  Received temporary HD catheter earlier this morning with IR, INR was too high for tunneling Creatinine worsened to 9.48, K4.5; only 0.5 L urine output reported Continues to have poor appetite Reviewed plan to initiate dialysis today, unclear if he will be short or long-term  Objective Vital signs in last 24  hours: Vitals:   05/26/21 1958 05/26/21 2127 05/27/21 0614 05/27/21 0738  BP:  (!) 176/52 (!) 159/59   Pulse: 64 (!) 59 60 60  Resp: $Remo'18 18 18 16  'vgJvW$ Temp:  97.9 F (36.6 C) 97.9 F (36.6 C)   TempSrc:      SpO2: 95% 95% 96% 95%  Weight:      Height:       Weight change:   Intake/Output Summary (Last 24 hours) at 05/27/2021 1239 Last data filed at 05/27/2021 0800 Gross per 24 hour  Intake 250 ml  Output 150 ml  Net 100 ml        Labs: Basic Metabolic Panel: Recent Labs  Lab 05/23/21 0521 05/23/21 1518 05/24/21 0121 05/24/21 1014 05/24/21 1732 05/25/21 0729 05/26/21 0058 05/27/21 0343  NA 136   < > 136 135   < > 137 137 138  K 5.9*   < > 6.2* 6.2*   < > 5.1 5.0 4.5  CL 104   < > 106 106   < > 108 109 107  CO2 22   < > 21* 20*   < > 19* 18* 19*  GLUCOSE 59*   < > 89 81   < > 94 96 82  BUN 94*   < > 89* 90*   < > 86* 87* 93*  CREATININE 10.52*   < > 9.52* 9.60*   < > 9.12* 9.09* 9.48*  CALCIUM 9.4   < > 8.4* 8.4*   < > 8.1* 8.3* 8.2*  PHOS 4.8*  --  5.3* 5.3*  --   --   --   --    < > =  values in this interval not displayed.    Liver Function Tests: Recent Labs  Lab 05/23/21 0030 05/23/21 0521 05/24/21 1014  AST 14* 13*  --   ALT 9 7  --   ALKPHOS 38 33*  --   BILITOT 0.6 0.6  --   PROT 6.0* 5.3*  --   ALBUMIN 3.0* 2.8* 2.5*    No results for input(s): LIPASE, AMYLASE in the last 168 hours. No results for input(s): AMMONIA in the last 168 hours. CBC: Recent Labs  Lab 05/23/21 0030 05/23/21 0521 05/24/21 0121 05/25/21 0729 05/25/21 1418 05/26/21 0058 05/27/21 0343  WBC 10.3 9.8 9.1 10.5  --  11.4* 11.2*  NEUTROABS 7.7  --   --   --   --   --   --   HGB 8.2* 7.6* 7.0* 7.0* 6.6* 8.5* 7.6*  HCT 25.1* 22.7* 20.5* 21.5* 19.9* 25.4* 23.4*  MCV 95.8 95.0 94.0 96.0  --  95.1 95.9  PLT 337 279 264 265  --  271 237    Cardiac Enzymes: No results for input(s): CKTOTAL, CKMB, CKMBINDEX, TROPONINI in the last 168 hours. CBG: Recent Labs  Lab  05/26/21 1222 05/26/21 1638 05/26/21 2126 05/27/21 0618 05/27/21 1103  GLUCAP 83 102* 89 75 84     Iron Studies:  No results for input(s): IRON, TIBC, TRANSFERRIN, FERRITIN in the last 72 hours.  Studies/Results: IR Fluoro Guide CV Line Right  Result Date: 05/27/2021 INDICATION: 80 year old male with acute kidney injury on chronic kidney disease requiring central venous access for hemodialysis. EXAM: NON-TUNNELED CENTRAL VENOUS HEMODIALYSIS CATHETER PLACEMENT WITH ULTRASOUND AND FLUOROSCOPIC GUIDANCE COMPARISON:  None. MEDICATIONS: None FLUOROSCOPY TIME:  0 minutes, 6 seconds (2 mGy) COMPLICATIONS: None immediate. PROCEDURE: Informed written consent was obtained from the patient after a discussion of the risks, benefits, and alternatives to treatment. Questions regarding the procedure were encouraged and answered. The right neck and chest were prepped with chlorhexidine in a sterile fashion, and a sterile drape was applied covering the operative field. Maximum barrier sterile technique with sterile gowns and gloves were used for the procedure. A timeout was performed prior to the initiation of the procedure. After the overlying soft tissues were anesthetized, a small venotomy incision was created and a micropuncture kit was utilized to access the internal jugular vein. Real-time ultrasound guidance was utilized for vascular access including the acquisition of a permanent ultrasound image documenting patency of the accessed vessel. The microwire was utilized to measure appropriate catheter length. A stiff glidewire was advanced to the level of the IVC. Under fluoroscopic guidance, the venotomy was serially dilated, ultimately allowing placement of a 12 French 24 cm temporary Trialysis catheter with tip ultimately terminating within the superior aspect of the right atrium. Final catheter positioning was confirmed and documented with a spot radiographic image. The catheter aspirates and flushes  normally. The catheter was flushed with appropriate volume heparin dwells. The catheter exit site was secured with a 0-Prolene retention suture. A dressing was placed. The patient tolerated the procedure well without immediate post procedural complication. IMPRESSION: Successful placement of a right internal jugular approach 24 cm temporary dialysis catheter with tip terminating with in the superior aspect of the right atrium. The catheter is ready for immediate use. PLAN: This catheter may be converted to a tunneled dialysis catheter at a later date as indicated. Ruthann Cancer, MD [facility] Radiology Electronically Signed   By: Ruthann Cancer M.D.   On: 05/27/2021 11:04  IR US Guide Vasc Access Right  Result Date: 05/27/2021 INDICATION: 80 year old male with acute kidney injury on chronic kidney disease requiring central venous access for hemodialysis. EXAM: NON-TUNNELED CENTRAL VENOUS HEMODIALYSIS CATHETER PLACEMENT WITH ULTRASOUND AND FLUOROSCOPIC GUIDANCE COMPARISON:  None. MEDICATIONS: None FLUOROSCOPY TIME:  0 minutes, 6 seconds (2 mGy) COMPLICATIONS: None immediate. PROCEDURE: Informed written consent was obtained from the patient after a discussion of the risks, benefits, and alternatives to treatment. Questions regarding the procedure were encouraged and answered. The right neck and chest were prepped with chlorhexidine in a sterile fashion, and a sterile drape was applied covering the operative field. Maximum barrier sterile technique with sterile gowns and gloves were used for the procedure. A timeout was performed prior to the initiation of the procedure. After the overlying soft tissues were anesthetized, a small venotomy incision was created and a micropuncture kit was utilized to access the internal jugular vein. Real-time ultrasound guidance was utilized for vascular access including the acquisition of a permanent ultrasound image documenting  patency of the accessed vessel. The microwire was utilized to measure appropriate catheter length. A stiff glidewire was advanced to the level of the IVC. Under fluoroscopic guidance, the venotomy was serially dilated, ultimately allowing placement of a 12 French 24 cm temporary Trialysis catheter with tip ultimately terminating within the superior aspect of the right atrium. Final catheter positioning was confirmed and documented with a spot radiographic image. The catheter aspirates and flushes normally. The catheter was flushed with appropriate volume heparin dwells. The catheter exit site was secured with a 0-Prolene retention suture. A dressing was placed. The patient tolerated the procedure well without immediate post procedural complication. IMPRESSION: Successful placement of a right internal jugular approach 24 cm temporary dialysis catheter with tip terminating with in the superior aspect of the right atrium. The catheter is ready for immediate use. PLAN: This catheter may be converted to a tunneled dialysis catheter at a later date as indicated. Ruthann Cancer, MD [facility] Radiology Electronically Signed   By: Ruthann Cancer M.D.   On: 05/27/2021 11:04   VAS [place] UPPER EXTREMITY VENOUS DUPLEX  Result Date: 05/27/2021 UPPER VENOUS STUDY  Patient Name:  [name] [name]  Date of Exam:   05/27/2021 Medical Rec #: [id number]        Accession #:    [id number] Date of Birth: Jan 11, 1941        Patient Gender: M Patient Age:   38 years Exam Location:  [facility] Procedure:      VAS [place] UPPER EXTREMITY VENOUS DUPLEX Referring Phys: Shawna Clamp --------------------------------------------------------------------------------  Indications: Swelling Comparison Study: no prior Performing Technologist: Archie Patten RVS  Examination Guidelines: A complete evaluation includes B-mode imaging, spectral Doppler, color Doppler, and power Doppler as needed of all  accessible portions of each vessel. Bilateral testing is considered an integral part of a complete examination. Limited examinations for reoccurring indications may be performed as noted.  Right Findings: +----------+------------+---------+-----------+----------+-------+ RIGHT     CompressiblePhasicitySpontaneousPropertiesSummary +----------+------------+---------+-----------+----------+-------+ Subclavian               Yes       Yes                      +----------+------------+---------+-----------+----------+-------+  Left Findings: +----------+------------+---------+-----------+----------+-------+ LEFT      CompressiblePhasicitySpontaneousPropertiesSummary +----------+------------+---------+-----------+----------+-------+ IJV           Full       Yes  Yes                      +----------+------------+---------+-----------+----------+-------+ Subclavian    Full       Yes       Yes                      +----------+------------+---------+-----------+----------+-------+ Axillary      Full       Yes       Yes                      +----------+------------+---------+-----------+----------+-------+ Brachial      Full       Yes       Yes                      +----------+------------+---------+-----------+----------+-------+ Radial        Full                                          +----------+------------+---------+-----------+----------+-------+ Ulnar         Full                                          +----------+------------+---------+-----------+----------+-------+ Cephalic      Full                                          +----------+------------+---------+-----------+----------+-------+ Basilic       Full                                          +----------+------------+---------+-----------+----------+-------+  Summary:  Right: No evidence of thrombosis in the subclavian.  Left: No evidence of deep vein thrombosis in the upper  extremity. No evidence of superficial vein thrombosis in the upper extremity.  *See table(s) above for measurements and observations.    Preliminary     Medications: Infusions:    Scheduled Medications:  sodium chloride   Intravenous Once   amLODipine  5 mg Oral Daily   budesonide  0.25 mg Nebulization BID   carvedilol  9.375 mg Oral BID   Chlorhexidine Gluconate Cloth  6 each Topical Daily   feeding supplement  237 mL Oral BID BM   ferrous sulfate  325 mg Oral Q breakfast   hydrALAZINE  50 mg Oral TID   insulin aspart  0-5 Units Subcutaneous QHS   insulin aspart  0-6 Units Subcutaneous TID WC   ipratropium-albuterol  3 mL Nebulization TID   levothyroxine  112 mcg Oral Q0600   lidocaine       multivitamin with minerals  1 tablet Oral Daily   pantoprazole  40 mg Oral BID   pravastatin  40 mg Oral q1800   sodium zirconium cyclosilicate  10 g Oral TID    have reviewed scheduled and prn medications.  Physical Exam: General:NAD, comfortable, lying flat in bed Heart:RRR, s1s2 nl Lungs:clear b/l, no crackle Abdomen:soft, Non-tender, non-distended Extremities:No edema Neurology: Alert awake and following commands.,  No clear asterixis  Rexene Agent  05/27/2021,12:39 PM  LOS: 4 days

## 2021-05-28 DIAGNOSIS — N189 Chronic kidney disease, unspecified: Secondary | ICD-10-CM | POA: Diagnosis not present

## 2021-05-28 DIAGNOSIS — N179 Acute kidney failure, unspecified: Secondary | ICD-10-CM | POA: Diagnosis not present

## 2021-05-28 LAB — CBC
HCT: 21.8 % — ABNORMAL LOW (ref 39.0–52.0)
Hemoglobin: 7.1 g/dL — ABNORMAL LOW (ref 13.0–17.0)
MCH: 31.4 pg (ref 26.0–34.0)
MCHC: 32.6 g/dL (ref 30.0–36.0)
MCV: 96.5 fL (ref 80.0–100.0)
Platelets: 240 10*3/uL (ref 150–400)
RBC: 2.26 MIL/uL — ABNORMAL LOW (ref 4.22–5.81)
RDW: 13.6 % (ref 11.5–15.5)
WBC: 12.5 10*3/uL — ABNORMAL HIGH (ref 4.0–10.5)
nRBC: 0 % (ref 0.0–0.2)

## 2021-05-28 LAB — MAGNESIUM: Magnesium: 2.1 mg/dL (ref 1.7–2.4)

## 2021-05-28 LAB — BASIC METABOLIC PANEL
Anion gap: 14 (ref 5–15)
BUN: 99 mg/dL — ABNORMAL HIGH (ref 8–23)
CO2: 19 mmol/L — ABNORMAL LOW (ref 22–32)
Calcium: 8.3 mg/dL — ABNORMAL LOW (ref 8.9–10.3)
Chloride: 105 mmol/L (ref 98–111)
Creatinine, Ser: 9.36 mg/dL — ABNORMAL HIGH (ref 0.61–1.24)
GFR, Estimated: 5 mL/min — ABNORMAL LOW (ref >60)
Glucose, Bld: 90 mg/dL (ref 70–99)
Potassium: 4.5 mmol/L (ref 3.5–5.1)
Sodium: 138 mmol/L (ref 135–145)

## 2021-05-28 LAB — GLUCOSE, CAPILLARY
Glucose-Capillary: 88 mg/dL (ref 70–99)
Glucose-Capillary: 93 mg/dL (ref 70–99)
Glucose-Capillary: 132 mg/dL — ABNORMAL HIGH (ref 70–99)
Glucose-Capillary: 105 mg/dL — ABNORMAL HIGH (ref 70–99)

## 2021-05-28 LAB — HEPATITIS B SURFACE ANTIBODY, QUANTITATIVE: Hep B S AB Quant (Post): <3.1 m[IU]/mL — ABNORMAL LOW (ref >9.9)

## 2021-05-28 LAB — PROTIME-INR
INR: 3.6 — ABNORMAL HIGH (ref 0.8–1.2)
Prothrombin Time: 35.9 seconds — ABNORMAL HIGH (ref 11.4–15.2)

## 2021-05-28 MED ORDER — VITAMIN K1 10 MG/ML IJ SOLN
1.0000 mg | Freq: Once | INTRAVENOUS | Status: AC
  Administered 2021-05-28: 1 mg via INTRAVENOUS
  Filled 2021-05-28 (×2): qty 0.1

## 2021-05-28 MED ORDER — SODIUM CHLORIDE 0.9 % IV SOLN
20.0000 ug | Freq: Once | INTRAVENOUS | Status: AC
  Administered 2021-05-28: 20 ug via INTRAVENOUS
  Filled 2021-05-28: qty 5

## 2021-05-28 NOTE — Progress Notes (Signed)
IR consulted for continued bleeding around IJ site. INR 3.6. I assessed the patient at the bedside and found a large gauze dressing saturated with frank blood. Dressing removed, site cleaned and quick clot strips applied. I attempted to hold pressure at the site but this was quite painful for the patient. I held light pressure and was successful in stopping the bleeding but as soon as the patient turned his head the site started bleeding again. I repeated this process three times without the desired results.   IR made the recommendation for DDAVP and this was ordered by Dr. Pietro Cassis. The patient was left with a clean/dry dressing and an explanation of the plan of care. Bedside RN made aware.  Please call IR if we can be of further assistance.  Soyla Dryer, [place] [phone] 05/28/2021, 8:52 AM

## 2021-05-28 NOTE — Progress Notes (Signed)
PROGRESS NOTE  [name] [name]  DOB: 1941/08/03  PCP: Claretta Fraise, MD UDJ:[id number]  DOA: 05/22/2021  LOS: 5 days  Hospital Day: 7  Chief complaint: Abnormal labs  Brief narrative: [name] [name] is a 80 y.o. male with PMH significant for CAD s/p CABG, atrial fibrillation, CHF, AICD, COPD, CKD stage IIIb. Patient presented to the ED on 8/19 from his PCPs office for abnormal labs He had elevated potassium and creatinine. CT abdomen and pelvis showed mild right hydronephrosis. Admitted to hospitalist service.  Nephrology and urology was consulted.    Subjective: Patient was seen and examined this morning.  Lying on bed.  Not in distress.  Underwent first dialysis this morning.  At this time, not bleeding from dialysis access site on right upper chest.  Assessment/Plan: New ESRD -Presented as AKI on CKD stage IV -Baseline creatinine 2.1-2.5, presented with serum creatinine 10.49 -Could be multifactorial, likely ATN due to dehydration, use of medications, Entresto, Aldactone and Jardiance. -Bladder scan without obstruction.   -UA shows proteinuria and hematuria.  Hematuria resolved. -Nephrology consult appreciated. -8/24, patient underwent triple-lumen catheter placement for initiation of dialysis.  Underwent first dialysis this morning.  Bleeding from the triple-lumen catheter site -Significant bleeding last 24 hours from the triple-lumen catheter site probably because of supratherapeutic INR. -At the request of radiology, given 1 dose of desmopressin this morning.  I will also give vitamin D 1 dose IV today because of coexisting hematuria -Compression bandage on.  No bleeding at this time of my evaluation.  Hyperkalemia/hyperphosphatemia -Potassium on admission was elevated to 6.9 without EKG changes.  Received calcium chloride, insulin, dextrose, albuterol, Lokelma. -Continue Lokelma daily Recent Labs  Lab 05/23/21 0521 05/23/21 1518 05/24/21 0121 05/24/21 1014  05/24/21 1732 05/25/21 0729 05/26/21 0058 05/27/21 0343 05/28/21 0420  K 5.9*   < > 6.2* 6.2* 5.8* 5.1 5.0 4.5 4.5  MG 2.3  --  2.1  --   --  2.1 1.9 2.0 2.1  PHOS 4.8*  --  5.3* 5.3*  --   --   --   --   --    < > = values in this interval not displayed.   Moderate protein calorie malnutrition Hypoalbuminemia -Continue protein supplements.  CHF Essential hypertension -Continue Coreg, Hydralazine and amlodipine -Entresto and spironolactone on hold due to hyperkalemia.  CAD s/p AICD: -Continue Coreg and statin. last device check was 04/02/21  A. Fib Supratherapeutic INR -Heart rate controlled on Coreg.  On Coumadin -INR remains supratherapeutic.  Pharmacy to dose Coumadin.  Vitamin K IV 1 dose today Recent Labs  Lab 05/24/21 0859 05/25/21 0729 05/26/21 0058 05/27/21 0343 05/28/21 0420  INR 2.6* 3.0* 3.1* 3.7* 3.6*   Carotid artery stenosis -Follows with vascular surgery.    Acute on chronic anemia -Hemoglobin as low as 6.6.  1 unit of PRBC transfused so far during this hospital stay.  Hematuria resolved but patient has been intermittently bleeding in last 24 hours from the catheter insertion site.  Monitor hemoglobin.  May need transfusion if less than 7. Recent Labs    10/23/20 1200 05/13/21 1325 05/24/21 0121 05/25/21 0729 05/25/21 1418 05/26/21 0058 05/27/21 0343 05/28/21 0420  HGB  --    < > 7.0* 7.0* 6.6* 8.5* 7.6* 7.1*  MCV  --    < > 94.0 96.0  --  95.1 95.9 96.5  VITAMINB12 464  --   --   --   --   --   --   --  FERRITIN  --   --  129  --   --   --   --   --   TIBC  --   --  189*  --   --   --   --   --   IRON  --   --  44*  --   --   --   --   --    < > = values in this interval not displayed.    Hypothyroidism -Continue Synthroid   Asthma with COPD -Continue DuoNeb, Pulmicort and albuterol   GERD -Continue Protonix    Hematuria -Patient had hematuria on the first few days of admission and it had resolved.  Urology recommended outpatient  work-up.  However he is having hematuria again today, probably related to supratherapeutic INR.  Hope that resolves after vitamin K is given.   Mobility: Encourage ambulation.  Needs PT eval after INR improves. Code Status:   Code Status: Full Code  Nutritional status: Body mass index is 26.44 kg/m. Nutrition Problem: Increased nutrient needs Etiology: acute illness (AKI) Signs/Symptoms: estimated needs Diet:  Diet Order             Diet Heart Room service appropriate? Yes; Fluid consistency: Thin  Diet effective now                  DVT prophylaxis:  SCDs Start: 05/23/21 4268   Antimicrobials: None Fluid: None Consultants: Nephrology Family Communication: Wife at bedside  Status is: Inpatient  Remains inpatient appropriate because: Newly initiated on dialysis.  Dispo: The patient is from: Home              Anticipated d/c is to: Likely home              Patient currently is not medically stable to d/c.   Difficult to place patient No     Infusions:   phytonadione (VITAMIN K) IV      Scheduled Meds:  sodium chloride   Intravenous Once   amLODipine  5 mg Oral Daily   budesonide  0.25 mg Nebulization BID   carvedilol  9.375 mg Oral BID   Chlorhexidine Gluconate Cloth  6 each Topical Daily   feeding supplement  237 mL Oral BID BM   ferrous sulfate  325 mg Oral Q breakfast   hydrALAZINE  50 mg Oral TID   insulin aspart  0-5 Units Subcutaneous QHS   insulin aspart  0-6 Units Subcutaneous TID WC   ipratropium-albuterol  3 mL Nebulization BID   levothyroxine  112 mcg Oral Q0600   multivitamin with minerals  1 tablet Oral Daily   pantoprazole  40 mg Oral BID   pravastatin  40 mg Oral q1800   sodium zirconium cyclosilicate  10 g Oral Daily    Antimicrobials: Anti-infectives (From admission, onward)    None       PRN meds: acetaminophen, albuterol, dextromethorphan-guaiFENesin, lidocaine (PF), senna-docusate, sodium chloride flush, traZODone    Objective: Vitals:   05/28/21 1137 05/28/21 1414  BP: (!) 165/61 (!) 164/52  Pulse: 68 (!) 59  Resp: 14 18  Temp:  98.6 F (37 C)  SpO2: 99% 100%    Intake/Output Summary (Last 24 hours) at 05/28/2021 1436 Last data filed at 05/28/2021 1134 Gross per 24 hour  Intake 200 ml  Output 650 ml  Net -450 ml   Filed Weights   05/27/21 2036 05/28/21 0932 05/28/21 1137  Weight: 75.5 kg 81.2 kg 81.2  kg   Weight change:  Body mass index is 26.44 kg/m.   Physical Exam: General exam: Pleasant, elderly Caucasian male.  Not in distress Skin: No rashes, lesions or ulcers. HEENT: Atraumatic, normocephalic, no obvious bleeding Lungs: Clear to auscultation bilaterally.  Right anterior chest wall with compression bandage on over central access catheter CVS: Regular rate and rhythm, no murmur GI/Abd soft, nontender, nondistended, bowel sound present CNS: Alert, awake, slow to respond, oriented to place and person Psychiatry: Mood appropriate Extremities: No pedal edema, no calf tenderness  Data Review: I have personally reviewed the laboratory data and studies available.  Recent Labs  Lab 05/23/21 0030 05/23/21 0521 05/24/21 0121 05/25/21 0729 05/25/21 1418 05/26/21 0058 05/27/21 0343 05/28/21 0420  WBC 10.3   < > 9.1 10.5  --  11.4* 11.2* 12.5*  NEUTROABS 7.7  --   --   --   --   --   --   --   HGB 8.2*   < > 7.0* 7.0* 6.6* 8.5* 7.6* 7.1*  HCT 25.1*   < > 20.5* 21.5* 19.9* 25.4* 23.4* 21.8*  MCV 95.8   < > 94.0 96.0  --  95.1 95.9 96.5  PLT 337   < > 264 265  --  271 237 240   < > = values in this interval not displayed.   Recent Labs  Lab 05/23/21 0521 05/23/21 1518 05/24/21 0121 05/24/21 1014 05/24/21 1732 05/25/21 0729 05/26/21 0058 05/27/21 0343 05/28/21 0420  NA 136   < > 136 135 137 137 137 138 138  K 5.9*   < > 6.2* 6.2* 5.8* 5.1 5.0 4.5 4.5  CL 104   < > 106 106 108 108 109 107 105  CO2 22   < > 21* 20* 19* 19* 18* 19* 19*  GLUCOSE 59*   < > 89 81 130* 94  96 82 90  BUN 94*   < > 89* 90* 91* 86* 87* 93* 99*  CREATININE 10.52*   < > 9.52* 9.60* 9.21* 9.12* 9.09* 9.48* 9.36*  CALCIUM 9.4   < > 8.4* 8.4* 8.2* 8.1* 8.3* 8.2* 8.3*  MG 2.3  --  2.1  --   --  2.1 1.9 2.0 2.1  PHOS 4.8*  --  5.3* 5.3*  --   --   --   --   --    < > = values in this interval not displayed.    F/u labs ordered Unresulted Labs (From admission, onward)     Start     Ordered   05/25/21 0500  Protime-INR  Daily,   R      05/24/21 7672            Signed, Terrilee Croak, MD [facility] 05/28/2021

## 2021-05-28 NOTE — Progress Notes (Addendum)
ANTICOAGULATION CONSULT NOTE - Initial Consult  Pharmacy Consult for Warfarin Indication: atrial fibrillation  No Known Allergies  Patient Measurements: Height: 5\' 9"  (175.3 cm) Weight: 75.5 kg (166 lb 8.1 oz) IBW/kg (Calculated) : 70.7  Vital Signs: Temp: 98.2 F (36.8 C) (08/25 0551) Temp Source: Oral (08/25 0551) BP: 158/51 (08/25 0551) Pulse Rate: 59 (08/25 0551)  Labs: Recent Labs    05/26/21 0058 05/27/21 0343 05/28/21 0420  HGB 8.5* 7.6* 7.1*  HCT 25.4* 23.4* 21.8*  PLT 271 237 240  LABPROT 32.2* 36.5* 35.9*  INR 3.1* 3.7* 3.6*  CREATININE 9.09* 9.48* 9.36*     Estimated Creatinine Clearance: 6.2 mL/min (A) (by C-G formula based on SCr of 9.36 mg/dL (H)).   Medical History: Past Medical History:  Diagnosis Date   AICD (automatic cardioverter/defibrillator) present 04/05/2017   Anemia    Arthritis    "hips; back" (07/09/2014)   Asthma    Atrial fibrillation (HCC)    CAD (coronary artery disease)    CHF (congestive heart failure) ([place])    Cholecystitis 11/2013   CKD (chronic kidney disease) 2015   Stage  3.    COPD (chronic obstructive pulmonary disease) (HCC)    on home oxygen, 2 liters at night10/2015   Esophageal reflux    Gallstones    Gastric antral vascular ectasia 2013   Gout    Heme positive stool    Hepatomegaly    Hiatal hernia    History of blood transfusion ~ 2012   "blood count dropped; had to get 3 units"   Hyperlipidemia    Hypothyroidism    Other and unspecified coagulation defects    Personal history of colonic polyps 05/29/2010   TUBULAR ADENOMA   Unspecified essential hypertension     Medications:  Medications Prior to Admission  Medication Sig Dispense Refill Last Dose   acetaminophen (TYLENOL) 500 MG tablet Take 500 mg by mouth every 6 (six) hours as needed for headache or mild pain.   05/22/2021   albuterol (VENTOLIN HFA) 108 (90 Base) MCG/ACT inhaler USE 2 INHALATIONS BY MOUTH  EVERY 6 HOURS AS NEEDED FOR SHORTNESS OF  BREATH (Patient taking differently: Inhale 2 puffs into the lungs every 6 (six) hours as needed for shortness of breath.) 34 g 0 05/23/2021   budesonide (PULMICORT) 0.25 MG/2ML nebulizer solution USE 1 VIAL  IN  NEBULIZER TWICE  DAILY - rinse mouth after treatment (Patient taking differently: Take 0.25 mg by nebulization See admin instructions. USE 1 VIAL  IN  NEBULIZER TWICE DAILY - rinse mouth after treatment) 6 mL 5 05/23/2021   carvedilol (COREG) 6.25 MG tablet Take 1.5 tablets (9.375 mg total) by mouth 2 (two) times daily. 270 tablet 0 05/22/2021 at 2000   Cholecalciferol (VITAMIN D3) 5000 UNITS CAPS Take 5,000 Units by mouth daily.   05/22/2021   docusate sodium (COLACE) 100 MG capsule Take 100 mg by mouth daily as needed for mild constipation.   Past Month   empagliflozin (JARDIANCE) 10 MG TABS tablet Take 1 tablet (10 mg total) by mouth daily before breakfast. 30 tablet 11 05/22/2021   ferrous sulfate 325 (65 FE) MG tablet Take 325 mg by mouth daily with breakfast.   05/22/2021   hydrALAZINE (APRESOLINE) 50 MG tablet TAKE 1 TABLET BY MOUTH 3  TIMES DAILY (Patient taking differently: Take 50 mg by mouth 3 (three) times daily.) 270 tablet 3 05/22/2021   HYDROcodone-acetaminophen (NORCO) 5-325 MG tablet Take 2 tablets by mouth every 4 (four) hours  as needed. (Patient taking differently: Take 1-2 tablets by mouth every 4 (four) hours as needed for moderate pain.) 10 tablet 0 05/22/2021   Hypromell-Glycerin-Naphazoline 0.8-0.25-0.012 % SOLN Place 1-2 drops into both eyes 3 (three) times daily as needed (for dry eyes.).   Past Month   ipratropium-albuterol (DUONEB) 0.5-2.5 (3) MG/3ML SOLN USE 1 VIAL IN NEBULIZER 4 TIMES DAILY (Patient taking differently: Take 3 mLs by nebulization 4 (four) times daily.) 9 mL 5 05/23/2021   levothyroxine (SYNTHROID) 112 MCG tablet TAKE 1 TABLET BY MOUTH  DAILY BEFORE BREAKFAST (Patient taking differently: Take 112 mcg by mouth daily before breakfast.) 90 tablet 3 05/22/2021    lovastatin (MEVACOR) 40 MG tablet Take 2 tablets (80 mg total) by mouth at bedtime. 180 tablet 3 05/22/2021   Multiple Vitamins-Minerals (MULTIVITAMINS THER. W/MINERALS) TABS Take 1 tablet by mouth daily.   05/22/2021   nitroGLYCERIN (NITROSTAT) 0.4 MG SL tablet Place 1 tablet (0.4 mg total) under the tongue every 5 (five) minutes as needed for chest pain. 6 tablet 1 unk   OXYGEN Inhale 2 L/min into the lungs See admin instructions. 2 L/min at bedtime and as needed daily as needed for shortness of breath      pantoprazole (PROTONIX) 40 MG tablet Take 1 tablet (40 mg total) by mouth 2 (two) times daily. (Patient taking differently: Take 40 mg by mouth in the morning and at bedtime.) 180 tablet 2 05/22/2021   sacubitril-valsartan (ENTRESTO) 97-103 MG Take 1 tablet by mouth 2 (two) times daily. 180 tablet 3 05/22/2021   spironolactone (ALDACTONE) 25 MG tablet Take 0.5 tablets (12.5 mg total) by mouth daily. 30 tablet 3 05/22/2021   sucralfate (CARAFATE) 1 g tablet TAKE 1 TABLET BY MOUTH 4  TIMES DAILY 1 HOUR BEFORE  MEALS AND AT BEDTIME (Patient taking differently: Take 1 g by mouth See admin instructions. Take 1 gram by mouth four times a day- ONE HOUR before meals and at bedtime) 360 tablet 1 05/22/2021   warfarin (COUMADIN) 5 MG tablet TAKE 1 TABLET BY MOUTH  EVERY MONDAY, WEDNESDAY,  AND FRIDAY AND ONE-HALF  TABLET BY MOUTH ON THE  REMAINING DAYS OF THE WEEK (Patient taking differently: Take 2.5 mg by mouth daily.) 65 tablet 0 05/22/2021 at 0900   cephALEXin (KEFLEX) 500 MG capsule Take 1 capsule (500 mg total) by mouth 4 (four) times daily. (Patient not taking: No sig reported) 28 capsule 0 Completed Course   fluticasone furoate-vilanterol (BREO ELLIPTA) 200-25 MCG/INH AEPB Inhale 1 puff into the lungs daily. (Patient not taking: Reported on 05/23/2021) 28 each 2 Not Taking   loratadine (CLARITIN) 10 MG tablet Take 10 mg by mouth daily as needed (for allergies.).    unk   Scheduled:    Assessment: 80yo  male on warfarin PTA for Afib. Admit INR 2.7 was therapeutic on PTA regimen of warfarin 2.5 mg every day.   INR 3.6 is above therapeutic goal and is only slightly decreased from 3.7 on 8/24. Hemoglobin is down from 7.6 to 7.1, but platelets are wnl. Patient has poor PO intake. Per notes, patient experienced oozing at the site of RIJ temporary dialysis catheter inserted on 8/24.  Goal of Therapy:  INR 2-3 Monitor platelets by anticoagulation protocol: Yes   Plan:  Hold warfarin again today. F/u daily INR, CBC. Monitor for s/sx of bleeding.  Collene Gobble, Student Pharmacist    I discussed / reviewed the pharmacy note by Dr. Pablo Ledger and I agree with the resident's findings  and plans as documented.  [name] also had a good suggestion to consider increasing norvasc dose or hydral to 75 mg tid if no improvement in BP.  Kryslyn Helbig S. Alford Highland, PharmD, BCPS Clinical Staff Pharmacist [email or web address]

## 2021-05-28 NOTE — Plan of Care (Signed)
  Problem: Education: Goal: Knowledge of General Education information will improve Description Including pain rating scale, medication(s)/side effects and non-pharmacologic comfort measures Outcome: Progressing   Problem: Health Behavior/Discharge Planning: Goal: Ability to manage health-related needs will improve Outcome: Progressing   

## 2021-05-28 NOTE — Progress Notes (Addendum)
Pt HD catheter on RIJ continues to bleed during the shift.[name] [name] was replaced at 2145 and pressure applied to site. Dressings became saturated with blood after about two hours. Dr. Myna Hidalgo was notified and ordered Thrombi pads.      IV team was consulted,they were able to replace dressing and apply thrombi pad. Will continue to monitor.  6962. Pt dressing remains intact. No sign of bleeding noted. Will continue to monitor.

## 2021-05-29 DIAGNOSIS — N179 Acute kidney failure, unspecified: Secondary | ICD-10-CM | POA: Diagnosis not present

## 2021-05-29 DIAGNOSIS — N189 Chronic kidney disease, unspecified: Secondary | ICD-10-CM | POA: Diagnosis not present

## 2021-05-29 LAB — GLUCOSE, CAPILLARY
Glucose-Capillary: 99 mg/dL (ref 70–99)
Glucose-Capillary: 95 mg/dL (ref 70–99)
Glucose-Capillary: 107 mg/dL — ABNORMAL HIGH (ref 70–99)
Glucose-Capillary: 126 mg/dL — ABNORMAL HIGH (ref 70–99)

## 2021-05-29 LAB — CBC WITH DIFFERENTIAL/PLATELET
Abs Immature Granulocytes: 0.09 10*3/uL — ABNORMAL HIGH (ref 0.00–0.07)
Basophils Absolute: 0.1 10*3/uL (ref 0.0–0.1)
Basophils Relative: 0 %
Eosinophils Absolute: 0.2 10*3/uL (ref 0.0–0.5)
Eosinophils Relative: 2 %
HCT: 20 % — ABNORMAL LOW (ref 39.0–52.0)
Hemoglobin: 6.5 g/dL — CL (ref 13.0–17.0)
Immature Granulocytes: 1 %
Lymphocytes Relative: 5 %
Lymphs Abs: 0.6 10*3/uL — ABNORMAL LOW (ref 0.7–4.0)
MCH: 32.2 pg (ref 26.0–34.0)
MCHC: 32.5 g/dL (ref 30.0–36.0)
MCV: 99 fL (ref 80.0–100.0)
Monocytes Absolute: 1.3 10*3/uL — ABNORMAL HIGH (ref 0.1–1.0)
Monocytes Relative: 10 %
Neutro Abs: 10.6 10*3/uL — ABNORMAL HIGH (ref 1.7–7.7)
Neutrophils Relative %: 82 %
Platelets: 193 10*3/uL (ref 150–400)
RBC: 2.02 MIL/uL — ABNORMAL LOW (ref 4.22–5.81)
RDW: 13.6 % (ref 11.5–15.5)
WBC: 12.9 10*3/uL — ABNORMAL HIGH (ref 4.0–10.5)
nRBC: 0 % (ref 0.0–0.2)

## 2021-05-29 LAB — BASIC METABOLIC PANEL
Anion gap: 13 (ref 5–15)
BUN: 72 mg/dL — ABNORMAL HIGH (ref 8–23)
CO2: 23 mmol/L (ref 22–32)
Calcium: 8 mg/dL — ABNORMAL LOW (ref 8.9–10.3)
Chloride: 102 mmol/L (ref 98–111)
Creatinine, Ser: 7.89 mg/dL — ABNORMAL HIGH (ref 0.61–1.24)
GFR, Estimated: 6 mL/min — ABNORMAL LOW (ref >60)
Glucose, Bld: 96 mg/dL (ref 70–99)
Potassium: 4 mmol/L (ref 3.5–5.1)
Sodium: 138 mmol/L (ref 135–145)

## 2021-05-29 LAB — PREPARE RBC (CROSSMATCH)

## 2021-05-29 LAB — PROTIME-INR
INR: 2.4 — ABNORMAL HIGH (ref 0.8–1.2)
Prothrombin Time: 25.7 seconds — ABNORMAL HIGH (ref 11.4–15.2)

## 2021-05-29 MED ORDER — CARVEDILOL 3.125 MG PO TABS
3.1250 mg | ORAL_TABLET | Freq: Two times a day (BID) | ORAL | Status: DC
  Administered 2021-05-29 – 2021-06-05 (×11): 3.125 mg via ORAL
  Filled 2021-05-29 (×12): qty 1

## 2021-05-29 MED ORDER — SODIUM CHLORIDE 0.9% IV SOLUTION: Freq: Once | INTRAVENOUS | Status: AC

## 2021-05-29 MED ORDER — NEPRO/CARBSTEADY PO LIQD
237.0000 mL | Freq: Three times a day (TID) | ORAL | Status: DC
  Administered 2021-05-29 – 2021-06-04 (×12): 237 mL via ORAL

## 2021-05-29 NOTE — Progress Notes (Signed)
[facility] NEPHROLOGY PROGRESS NOTE  Assessment/ Plan: Pt is a 80 y.o. yo male  with medical history of hypertension, COPD, CAD status post CABG, A. fib on anticoagulation, CHF, CKD 4 who was presented to the ER for abnormal lab results (sent by his urologist) seen as a consultation for the evaluation of AKI on CKD and hyperkalemia.  #Dialysis dependent AKI with CKD 4 :  Likely 2/2 ischemic ATN due to dehydration/decreased oral intake concomitant with the use of Entresto, Aldactone and Jardiance in atrophic left kidney/bilateral renal artery stenosis. Bladder scan and kidney ultrasound without obstruction, has left renal atrophy.  UA with blood and around 1.9 g of proteinuria.     Started HD 8/25 Received temporary HD cath with IR 8/24, will need tunneled catheter when INR is permissive No evidence of GFR recovery, plan for dialysis again today: 2K, 3h, up to 1L UF, no heparin Continue to hold Jardiance, Entresto and Aldactone.    #Hyperkalemia: Resolved, CTM  #Hypertension: Trend.  Continue carvedilol and hydralazine.     #Hematuria: Seen by urologist and concern about possible right renal pelvis mass.  Recommending outpt eval. Per Urology.     #Anemia due to blood loss/hematuria and chronic illness/CKD: Transfuse as needed. For now, no ESA because of possible pelvic mass.    #History of CHF: not overloaded.  Holding cardiac meds because of AKI.  Subjective:  First HD yesterday, no UF Hb down, likely from bleeding from Temp HD site with elevated INR No significant increase in urine output Potassium 4.0, Lokelma has been stopped  Objective Vital signs in last 24 hours: Vitals:   05/29/21 1029 05/29/21 1030 05/29/21 1046 05/29/21 1105  BP: (!) 152/49   (!) 142/93  Pulse: 60   64  Resp: 18   18  Temp: (!) 97.4 F (36.3 C) (!) 97.4 F (36.3 C) (!) 97.4 F (36.3 C) 97.9 F (36.6 C)  TempSrc: Oral Oral Oral Oral  SpO2: 100%   99%  Weight:      Height:        Weight change: 5.673 kg  Intake/Output Summary (Last 24 hours) at 05/29/2021 1159 Last data filed at 05/29/2021 1029 Gross per 24 hour  Intake 425 ml  Output 200 ml  Net 225 ml        Labs: Basic Metabolic Panel: Recent Labs  Lab 05/23/21 0521 05/23/21 1518 05/24/21 0121 05/24/21 1014 05/24/21 1732 05/27/21 0343 05/28/21 0420 05/29/21 0420  NA 136   < > 136 135   < > 138 138 138  K 5.9*   < > 6.2* 6.2*   < > 4.5 4.5 4.0  CL 104   < > 106 106   < > 107 105 102  CO2 22   < > 21* 20*   < > 19* 19* 23  GLUCOSE 59*   < > 89 81   < > 82 90 96  BUN 94*   < > 89* 90*   < > 93* 99* 72*  CREATININE 10.52*   < > 9.52* 9.60*   < > 9.48* 9.36* 7.89*  CALCIUM 9.4   < > 8.4* 8.4*   < > 8.2* 8.3* 8.0*  PHOS 4.8*  --  5.3* 5.3*  --   --   --   --    < > = values in this interval not displayed.    Liver Function Tests: Recent Labs  Lab 05/23/21 0030 05/23/21 0521 05/24/21 1014  AST  14* 13*  --   ALT 9 7  --   ALKPHOS 38 33*  --   BILITOT 0.6 0.6  --   PROT 6.0* 5.3*  --   ALBUMIN 3.0* 2.8* 2.5*    No results for input(s): LIPASE, AMYLASE in the last 168 hours. No results for input(s): AMMONIA in the last 168 hours. CBC: Recent Labs  Lab 05/23/21 0030 05/23/21 0521 05/25/21 0729 05/25/21 1418 05/26/21 0058 05/27/21 0343 05/28/21 0420 05/29/21 0420  WBC 10.3   < > 10.5  --  11.4* 11.2* 12.5* 12.9*  NEUTROABS 7.7  --   --   --   --   --   --  10.6*  HGB 8.2*   < > 7.0*   < > 8.5* 7.6* 7.1* 6.5*  HCT 25.1*   < > 21.5*   < > 25.4* 23.4* 21.8* 20.0*  MCV 95.8   < > 96.0  --  95.1 95.9 96.5 99.0  PLT 337   < > 265  --  271 237 240 193   < > = values in this interval not displayed.    Cardiac Enzymes: No results for input(s): CKTOTAL, CKMB, CKMBINDEX, TROPONINI in the last 168 hours. CBG: Recent Labs  Lab 05/28/21 0601 05/28/21 1228 05/28/21 1610 05/28/21 2125 05/29/21 0634  GLUCAP 88 93 132* 105* 99     Iron Studies:  No results for input(s): IRON,  TIBC, TRANSFERRIN, FERRITIN in the last 72 hours.  Studies/Results: No results found.  Medications: Infusions:    Scheduled Medications:  sodium chloride   Intravenous Once   budesonide  0.25 mg Nebulization BID   carvedilol  3.125 mg Oral BID WC   Chlorhexidine Gluconate Cloth  6 each Topical Daily   feeding supplement  237 mL Oral BID BM   ferrous sulfate  325 mg Oral Q breakfast   insulin aspart  0-5 Units Subcutaneous QHS   insulin aspart  0-6 Units Subcutaneous TID WC   ipratropium-albuterol  3 mL Nebulization BID   levothyroxine  112 mcg Oral Q0600   multivitamin with minerals  1 tablet Oral Daily   pantoprazole  40 mg Oral BID   pravastatin  40 mg Oral q1800    have reviewed scheduled and prn medications.  Physical Exam: General:NAD, comfortable, lying flat in bed Heart:RRR, s1s2 nl Lungs:clear b/l, no crackle Abdomen:soft, Non-tender, non-distended Extremities:No edema Neurology: Alert awake and following commands.,  No clear asterixis NECK: R IJ Temp HD cath bandaged and clean, no blood  Azariah Bonura B Renji Berwick 05/29/2021,11:59 AM  LOS: 6 days

## 2021-05-29 NOTE — TOC Progression Note (Signed)
Transition of Care [facility]) - Progression Note    Patient Details  Name: [name] [name] MRN: [id number] Date of Birth: [birth year]  [facility] CM/SW Contact  Tom-Johnson, Renea Ee, RN Phone Number: 05/29/2021, 2:59 PM  Clinical Narrative:    Anderson Malta with [facility] sent a secure message to CM stating that they do not have a HD bed at this time and she also states that she checked with [facility] facility and they also do not have a HD bed at this time. Raquel Sarna with [facility] called and CM left her another secure message. Awaiting call. TOC will continue to follow.   Expected Discharge Plan:  (Awaiting PT eval.) Barriers to Discharge: Continued Medical Work up  Expected Discharge Plan and Services Expected Discharge Plan:  (Awaiting PT eval.)   Discharge Planning Services: CM Consult   Living arrangements for the past 2 months: Single Family Home                                       Social Determinants of Health (SDOH) Interventions    Readmission Risk Interventions No flowsheet data found.

## 2021-05-29 NOTE — Plan of Care (Signed)
  Problem: Clinical Measurements: Goal: Diagnostic test results will improve Outcome: Progressing Goal: Respiratory complications will improve Outcome: Progressing Goal: Cardiovascular complication will be avoided Outcome: Progressing   Problem: Coping: Goal: Level of anxiety will decrease Outcome: Progressing   Problem: Education: Goal: Knowledge of disease and its progression will improve Outcome: Progressing   Problem: Fluid Volume: Goal: Fluid volume balance will be maintained or improved Outcome: Progressing

## 2021-05-29 NOTE — Progress Notes (Signed)
Patient hemoglobin 6.5 this morning.Text paged Dr.Opyd awaiting response.

## 2021-05-29 NOTE — Progress Notes (Signed)
Nutrition Follow-up  DOCUMENTATION CODES:  Not applicable  INTERVENTION:  Discontinue Ensure Enlive/Plus BID.  Add Nepro Shake po TID, each supplement provides 425 kcal and 19 grams protein.  Continue Magic Cup TID.  Continue MVI with minerals daily.  Encourage PO intake.  NUTRITION DIAGNOSIS:  Increased nutrient needs related to acute illness (AKI) as evidenced by estimated needs. - ongoing  GOAL:  Patient will meet greater than or equal to 90% of their needs - not meeting  MONITOR:  PO intake, Supplement acceptance, Labs, Weight trends, I & O's  REASON FOR ASSESSMENT:  Malnutrition Screening Tool    ASSESSMENT:  80 yo male with a PMH of CAD s/p CABG, atrial fibrillation, CHF, AICD, COPD, CKD stage IIIb, presented in the ED after he was sent from PCP to go to the ED for abnormal labs.  He was found to have hyperkalemia and AKI.  CT abdomen and pelvis showed mild right hydronephrosis. 8/24 - temporary HD cath placed with IR 8/25 - started HD  PO intake is poor, 0-100% intake, mostly 0-25%.  Admit wt: 85 kg Current wt: 83.6 kg  Pt still with considerate edema, moderate generalized, moderate BUE, and mild BLE.  UOP: 200 ml/24 hrs  RD to recommend discontinuing Ensure and trying Nepro shakes TID to promote intake. Continue Magic Cup TID and MVI with minerals.   May need to liberalize diet to regular if PO intake does not improve.  Medications: reviewed; EE/EP BID, ferrous sulfate, SSI, Synthroid, MVI with minerals, Protonix   Labs: reviewed; CBG 93-132  Diet Order:   Diet Order             Diet Heart Room service appropriate? Yes; Fluid consistency: Thin  Diet effective now                  EDUCATION NEEDS:  Education needs have been addressed  Skin:  Skin Assessment: Reviewed RN Assessment  Last BM:  05/29/21 - Type 6, black, small  Height:  Ht Readings from Last 1 Encounters:  05/23/21 5\' 9"  (1.753 m)   Weight:  Wt Readings from Last 1  Encounters:  05/29/21 83.6 kg   BMI:  Body mass index is 27.22 kg/m.  Estimated Nutritional Needs:  Kcal:  1950-2150 Protein:  90-105 grams Fluid:  UOP + 1000 ml  Derrel Nip, RD, LDN (she/her/hers) Registered Dietitian I After-Hours/Weekend Pager # in [place]

## 2021-05-29 NOTE — Progress Notes (Signed)
PT Cancellation Note  Patient Details Name: JOURDAIN GUAY MRN: [id number] DOB: 15-Nov-1940   Cancelled Treatment:    Reason Eval/Treat Not Completed: Medical issues which prohibited therapy Pt with hgb at 6.5 and has not received transfusion. Will follow up as pt medically appropriate and as schedule allows.   Reuel Derby, PT, DPT  Acute Rehabilitation Services  Pager: [phone] Office: [phone]  Rudean Hitt 05/29/2021, 10:12 AM

## 2021-05-29 NOTE — Progress Notes (Signed)
ANTICOAGULATION CONSULT NOTE - Follow Up Consult  Pharmacy Consult for Warfarin Indication: atrial fibrillation  No Known Allergies  Patient Measurements: Height: 5\' 9"  (175.3 cm) Weight: 83.6 kg (184 lb 4.9 oz) IBW/kg (Calculated) : 70.7  Vital Signs: Temp: 98.2 F (36.8 C) (08/26 0506) Temp Source: Oral (08/26 0506) BP: 120/51 (08/26 0506) Pulse Rate: 58 (08/26 0506)  Labs: Recent Labs    05/27/21 0343 05/28/21 0420 05/29/21 0420  HGB 7.6* 7.1* 6.5*  HCT 23.4* 21.8* 20.0*  PLT 237 240 193  LABPROT 36.5* 35.9* 25.7*  INR 3.7* 3.6* 2.4*  CREATININE 9.48* 9.36* 7.89*     Estimated Creatinine Clearance: 7.3 mL/min (A) (by C-G formula based on SCr of 7.89 mg/dL (H)).   Medical History: Past Medical History:  Diagnosis Date   AICD (automatic cardioverter/defibrillator) present 04/05/2017   Anemia    Arthritis    "hips; back" (07/09/2014)   Asthma    Atrial fibrillation (HCC)    CAD (coronary artery disease)    CHF (congestive heart failure) ([place])    Cholecystitis 11/2013   CKD (chronic kidney disease) 2015   Stage  3.    COPD (chronic obstructive pulmonary disease) (HCC)    on home oxygen, 2 liters at night10/2015   Esophageal reflux    Gallstones    Gastric antral vascular ectasia 2013   Gout    Heme positive stool    Hepatomegaly    Hiatal hernia    History of blood transfusion ~ 2012   "blood count dropped; had to get 3 units"   Hyperlipidemia    Hypothyroidism    Other and unspecified coagulation defects    Personal history of colonic polyps 05/29/2010   TUBULAR ADENOMA   Unspecified essential hypertension     Medications:  Medications Prior to Admission  Medication Sig Dispense Refill Last Dose   acetaminophen (TYLENOL) 500 MG tablet Take 500 mg by mouth every 6 (six) hours as needed for headache or mild pain.   05/22/2021   albuterol (VENTOLIN HFA) 108 (90 Base) MCG/ACT inhaler USE 2 INHALATIONS BY MOUTH  EVERY 6 HOURS AS NEEDED FOR SHORTNESS OF  BREATH (Patient taking differently: Inhale 2 puffs into the lungs every 6 (six) hours as needed for shortness of breath.) 34 g 0 05/23/2021   budesonide (PULMICORT) 0.25 MG/2ML nebulizer solution USE 1 VIAL  IN  NEBULIZER TWICE  DAILY - rinse mouth after treatment (Patient taking differently: Take 0.25 mg by nebulization See admin instructions. USE 1 VIAL  IN  NEBULIZER TWICE DAILY - rinse mouth after treatment) 6 mL 5 05/23/2021   carvedilol (COREG) 6.25 MG tablet Take 1.5 tablets (9.375 mg total) by mouth 2 (two) times daily. 270 tablet 0 05/22/2021 at 2000   Cholecalciferol (VITAMIN D3) 5000 UNITS CAPS Take 5,000 Units by mouth daily.   05/22/2021   docusate sodium (COLACE) 100 MG capsule Take 100 mg by mouth daily as needed for mild constipation.   Past Month   empagliflozin (JARDIANCE) 10 MG TABS tablet Take 1 tablet (10 mg total) by mouth daily before breakfast. 30 tablet 11 05/22/2021   ferrous sulfate 325 (65 FE) MG tablet Take 325 mg by mouth daily with breakfast.   05/22/2021   hydrALAZINE (APRESOLINE) 50 MG tablet TAKE 1 TABLET BY MOUTH 3  TIMES DAILY (Patient taking differently: Take 50 mg by mouth 3 (three) times daily.) 270 tablet 3 05/22/2021   HYDROcodone-acetaminophen (NORCO) 5-325 MG tablet Take 2 tablets by mouth every 4 (four)  hours as needed. (Patient taking differently: Take 1-2 tablets by mouth every 4 (four) hours as needed for moderate pain.) 10 tablet 0 05/22/2021   Hypromell-Glycerin-Naphazoline 0.8-0.25-0.012 % SOLN Place 1-2 drops into both eyes 3 (three) times daily as needed (for dry eyes.).   Past Month   ipratropium-albuterol (DUONEB) 0.5-2.5 (3) MG/3ML SOLN USE 1 VIAL IN NEBULIZER 4 TIMES DAILY (Patient taking differently: Take 3 mLs by nebulization 4 (four) times daily.) 9 mL 5 05/23/2021   levothyroxine (SYNTHROID) 112 MCG tablet TAKE 1 TABLET BY MOUTH  DAILY BEFORE BREAKFAST (Patient taking differently: Take 112 mcg by mouth daily before breakfast.) 90 tablet 3 05/22/2021    lovastatin (MEVACOR) 40 MG tablet Take 2 tablets (80 mg total) by mouth at bedtime. 180 tablet 3 05/22/2021   Multiple Vitamins-Minerals (MULTIVITAMINS THER. W/MINERALS) TABS Take 1 tablet by mouth daily.   05/22/2021   nitroGLYCERIN (NITROSTAT) 0.4 MG SL tablet Place 1 tablet (0.4 mg total) under the tongue every 5 (five) minutes as needed for chest pain. 6 tablet 1 unk   OXYGEN Inhale 2 L/min into the lungs See admin instructions. 2 L/min at bedtime and as needed daily as needed for shortness of breath      pantoprazole (PROTONIX) 40 MG tablet Take 1 tablet (40 mg total) by mouth 2 (two) times daily. (Patient taking differently: Take 40 mg by mouth in the morning and at bedtime.) 180 tablet 2 05/22/2021   sacubitril-valsartan (ENTRESTO) 97-103 MG Take 1 tablet by mouth 2 (two) times daily. 180 tablet 3 05/22/2021   spironolactone (ALDACTONE) 25 MG tablet Take 0.5 tablets (12.5 mg total) by mouth daily. 30 tablet 3 05/22/2021   sucralfate (CARAFATE) 1 g tablet TAKE 1 TABLET BY MOUTH 4  TIMES DAILY 1 HOUR BEFORE  MEALS AND AT BEDTIME (Patient taking differently: Take 1 g by mouth See admin instructions. Take 1 gram by mouth four times a day- ONE HOUR before meals and at bedtime) 360 tablet 1 05/22/2021   warfarin (COUMADIN) 5 MG tablet TAKE 1 TABLET BY MOUTH  EVERY MONDAY, WEDNESDAY,  AND FRIDAY AND ONE-HALF  TABLET BY MOUTH ON THE  REMAINING DAYS OF THE WEEK (Patient taking differently: Take 2.5 mg by mouth daily.) 65 tablet 0 05/22/2021 at 0900   cephALEXin (KEFLEX) 500 MG capsule Take 1 capsule (500 mg total) by mouth 4 (four) times daily. (Patient not taking: No sig reported) 28 capsule 0 Completed Course   fluticasone furoate-vilanterol (BREO ELLIPTA) 200-25 MCG/INH AEPB Inhale 1 puff into the lungs daily. (Patient not taking: Reported on 05/23/2021) 28 each 2 Not Taking   loratadine (CLARITIN) 10 MG tablet Take 10 mg by mouth daily as needed (for allergies.).    unk   Scheduled:    Assessment: 80yo  male on warfarin PTA for Afib. Admit INR 2.7 was therapeutic on PTA regimen of warfarin 2.5 mg every day.  INR 2.4 is at therapeutic goal and is decreased from 3.6 on 8/25. Patient received vitamin K 1 mg IV x1 on 8/25. Per notes, patient experienced bleeding from the site of RIJ temporary dialysis catheter inserted on 8/24 and received desmopressin 20 mcg IV x1 per IR on 8/25. Per MD on 8/26, patient is experiencing hematuria. Hemoglobin is down to 6.5 from 7.1, and patient will receive 1 unit of PRBCs on 8/26. Platelets are wnl.  Goal of Therapy:  INR 2-3 Monitor platelets by anticoagulation protocol: Yes   Plan:  Hold warfarin again today. F/u daily INR, CBC.  Monitor for s/sx of bleeding.  Collene Gobble, Student Pharmacist

## 2021-05-29 NOTE — TOC Initial Note (Signed)
Transition of Care [facility]) - Initial/Assessment Note    Patient Details  Name: [name] [name] MRN: [id number] Date of Birth: Jan 09, 1941  Transition of [facility]) CM/SW Contact:    Tom-Johnson, Renea Ee, RN Phone Number: 05/29/2021, 2:27 PM  Clinical Narrative:                 CM spoke with patient and spouse at bedside about TOC needs. Patient lives at home with wife. Drives himself to and from appointments. Has a cane,walker and bedside commode at home. Medical work up continues. PT unable to eval as patient's hgb is 6.5 this morning and PRBC ordered. TOC will continue to follow for needs.   Expected Discharge Plan:  (Awaiting PT eval.) Barriers to Discharge: Continued Medical Work up   Patient Goals and CMS Choice Patient states their goals for this hospitalization and ongoing recovery are:: To go home      Expected Discharge Plan and Services Expected Discharge Plan:  (Awaiting PT eval.)   Discharge Planning Services: CM Consult   Living arrangements for the past 2 months: Single Family Home                                      Prior Living Arrangements/Services Living arrangements for the past 2 months: Single Family Home Lives with:: Spouse Patient language and need for interpreter reviewed:: Yes Do you feel safe going back to the place where you live?: Yes      Need for Family Participation in Patient Care: Yes (Comment) Care giver support system in place?: Yes (comment)   Criminal Activity/Legal Involvement Pertinent to Current Situation/Hospitalization: No - Comment as needed  Activities of Daily Living Home Assistive Devices/Equipment: Walker (specify type) (4 wheel walker) ADL Screening (condition at time of admission) Patient's cognitive ability adequate to safely complete daily activities?: Yes Is the patient deaf or have difficulty hearing?: No Does the patient have difficulty seeing, even when wearing glasses/contacts?: No Does the patient  have difficulty concentrating, remembering, or making decisions?: No Patient able to express need for assistance with ADLs?: Yes Does the patient have difficulty dressing or bathing?: No Independently performs ADLs?: No Communication: Independent Dressing (OT): Independent Grooming: Independent Feeding: Independent Bathing: Needs assistance Is this a change from baseline?: Change from baseline, expected to last <3 days Toileting: Needs assistance Is this a change from baseline?: Change from baseline, expected to last <3 days In/Out Bed: Needs assistance Is this a change from baseline?: Change from baseline, expected to last <3 days Walks in Home: Independent with device (comment) (walker) Does the patient have difficulty walking or climbing stairs?: Yes Weakness of Legs: Both Weakness of Arms/Hands: Both  Permission Sought/Granted Permission sought to share information with : Case Manager Permission granted to share information with : Yes, Verbal Permission Granted              Emotional Assessment Appearance:: Appears stated age Attitude/Demeanor/Rapport: Engaged Affect (typically observed): Accepting, Appropriate Orientation: : Oriented to Self, Oriented to Place, Oriented to  Time, Oriented to Situation Alcohol / Substance Use: Not Applicable Psych Involvement: No (comment)  Admission diagnosis:  Hyperkalemia [E87.5] Obstruction (acquired) of bladder neck or vesicourethral orifice [N32.0] Acute renal failure, unspecified acute renal failure type [facility]) [N17.9] Patient Active Problem List   Diagnosis Date Noted   Hyperkalemia 05/23/2021   Hyperphosphatemia 05/23/2021   Hyponatremia 05/23/2021   Hypoalbuminemia 05/23/2021   Hypoglycemia  due to insulin 05/23/2021   Hematuria of unknown cause 05/13/2021   ICD (implantable cardioverter-defibrillator) in place 05/04/2019   NSVT (nonsustained ventricular tachycardia) ([place]) 05/04/2019   Hypothyroidism 01/16/2018    Bradycardia 03/18/2017   GI bleed 03/22/2016   Dyspnea 03/22/2016   GAVE (gastric antral vascular ectasia)    CKD (chronic kidney disease), stage III ([place]) 01/06/2015   Acute kidney injury superimposed on CKD ([place]) 11/26/2014   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[place]) 07/09/2014   Coronary atherosclerosis of native coronary artery 06/04/2014   Atrial fibrillation, chronic ([place]) 01/16/2013   Gout 12/16/2012   Hyperlipemia 12/16/2012   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

## 2021-05-29 NOTE — Progress Notes (Signed)
PROGRESS NOTE  [name] [name]  DOB: Mar 04, 1941  PCP: Claretta Fraise, MD IHK:[id number]  DOA: 05/22/2021  LOS: 6 days  Hospital Day: 8  Chief complaint: Abnormal labs  Brief narrative: [name] [name] is a 80 y.o. male with PMH significant for CAD s/p CABG, atrial fibrillation, CHF, AICD, COPD, CKD stage IIIb. Patient presented to the ED on 8/19 from his PCPs office for abnormal labs He had elevated potassium and creatinine. CT abdomen and pelvis showed mild right hydronephrosis. Admitted to hospitalist service.  Nephrology and urology was consulted.    Subjective: Patient was seen and examined this morning.  Lying on bed.  Feels uncomfortable.  Nonspecific complaint.  Denies any chest pain.  No bleeding from dialysis access site any longer.  Has old blood in [place]  Blood pressure in 90s per RN.   Labs this morning with hemoglobin low at 6.5.  1 unit was ordered which he has not gotten yet.  INR down to 2.4 today.    Assessment/Plan: New ESRD -Presented as AKI on CKD stage IV -Baseline creatinine 2.1-2.5, presented with serum creatinine 10.49 -Could be multifactorial, likely ATN due to dehydration, use of medications, Entresto, Aldactone and Jardiance. -Bladder scan did not show any obstruction. UA showed proteinuria and hematuria.   -Nephrology consult appreciated. -8/24, patient underwent triple-lumen catheter placement for initiation of dialysis.  Underwent first dialysis yesterday 8/24.  Bleeding from the triple-lumen catheter site Supratherapeutic INR -8/25, patient had significant bleeding last 24 hours from the triple-lumen catheter site probably because of supratherapeutic INR.  He was given a dose of desmopressin and 1 dose of vitamin K IV 1 mg.  INR now improving, 2.4 this morning.  We will keep Coumadin on hold.  If subtherapeutic tomorrow, will reinitiate Coumadin in the morning. Recent Labs  Lab 05/25/21 0729 05/26/21 0058 05/27/21 0343 05/28/21 0420  05/29/21 0420  INR 3.0* 3.1* 3.7* 3.6* 2.4*   Hematuria -Patient had hematuria on the first few days of admission and it had resolved.  Urology recommended outpatient work-up.  However on 8/25, he started have hematuria again.  Probably related to supratherapeutic INR.  Has old blood Urobag today.  Continue to monitor for recurrent bleeding.  Acute on chronic anemia -Hemoglobin low again this morning at 6.5 due to bleeding from dialysis access site and hematuria last 24 to 48 hours.  1 unit of PRBC ordered for this morning.  This will be his second unit during this hospitalization.   -Continue to monitor hemoglobin. Recent Labs    10/23/20 1200 05/13/21 1325 05/24/21 0121 05/25/21 0729 05/25/21 1418 05/26/21 0058 05/27/21 0343 05/28/21 0420 05/29/21 0420  HGB  --    < > 7.0*   < > 6.6* 8.5* 7.6* 7.1* 6.5*  MCV  --    < > 94.0   < >  --  95.1 95.9 96.5 99.0  VITAMINB12 464  --   --   --   --   --   --   --   --   FERRITIN  --   --  129  --   --   --   --   --   --   TIBC  --   --  189*  --   --   --   --   --   --   IRON  --   --  44*  --   --   --   --   --   --    < > =  values in this interval not displayed.   HypOtension History of CHF/ HTN -Currently on Coreg, Hydralazine and amlodipine.  Blood pressure in 90s this morning.  Probably headed for 2nd HD today.  For now, I will hold amlodipine and hydralazine.  I would continue Coreg but at a lower dose.  Entresto and spironolactone are also on hold.  A. Fib -Heart rate controlled on Coreg.  Plan for Coumadin as above.  CAD s/p AICD: -Continue Coreg and statin. last device check was 04/02/21  Carotid artery stenosis -Follows with vascular surgery.   Hyperkalemia/hyperphosphatemia -Potassium on admission was elevated to 6.9 without EKG changes.  Received calcium chloride, insulin, dextrose, albuterol, Lokelma.  Potassium down to normal now.  Can hold Lokelma.  Moderate protein calorie  malnutrition Hypoalbuminemia -Continue protein supplements.  Hypothyroidism -Continue Synthroid   Asthma with COPD -Continue DuoNeb, Pulmicort and albuterol   GERD -Continue Protonix  COPD on 3 L oxygen at home -Stable    Mobility: Encourage ambulation.  Needs PT eval Code Status:   Code Status: Full Code  Nutritional status: Body mass index is 27.22 kg/m. Nutrition Problem: Increased nutrient needs Etiology: acute illness (AKI) Signs/Symptoms: estimated needs Diet:  Diet Order             Diet Heart Room service appropriate? Yes; Fluid consistency: Thin  Diet effective now                  DVT prophylaxis:  SCDs Start: 05/23/21 2831   Antimicrobials: None Fluid: None Consultants: Nephrology Family Communication: Wife at bedside  Status is: Inpatient  Remains inpatient appropriate because: Newly initiated on dialysis.  Dispo: The patient is from: Home              Anticipated d/c is to: Likely home in next several days.  PT eval ordered.              Patient currently is not medically stable to d/c.   Difficult to place patient No     Infusions:     Scheduled Meds:  sodium chloride   Intravenous Once   sodium chloride   Intravenous Once   budesonide  0.25 mg Nebulization BID   carvedilol  3.125 mg Oral BID WC   Chlorhexidine Gluconate Cloth  6 each Topical Daily   feeding supplement  237 mL Oral BID BM   ferrous sulfate  325 mg Oral Q breakfast   hydrALAZINE  50 mg Oral TID   insulin aspart  0-5 Units Subcutaneous QHS   insulin aspart  0-6 Units Subcutaneous TID WC   ipratropium-albuterol  3 mL Nebulization BID   levothyroxine  112 mcg Oral Q0600   multivitamin with minerals  1 tablet Oral Daily   pantoprazole  40 mg Oral BID   pravastatin  40 mg Oral q1800    Antimicrobials: Anti-infectives (From admission, onward)    None       PRN meds: acetaminophen, albuterol, dextromethorphan-guaiFENesin, lidocaine (PF), senna-docusate,  sodium chloride flush, traZODone   Objective: Vitals:   05/29/21 0506 05/29/21 0807  BP: (!) 120/51   Pulse: (!) 58   Resp: 20   Temp: 98.2 F (36.8 C)   SpO2: 100% 99%    Intake/Output Summary (Last 24 hours) at 05/29/2021 0932 Last data filed at 05/29/2021 0600 Gross per 24 hour  Intake 350 ml  Output 200 ml  Net 150 ml    Filed Weights   05/28/21 0932 05/28/21 1137 05/29/21 0506  Weight:  81.2 kg 81.2 kg 83.6 kg   Weight change: 5.673 kg Body mass index is 27.22 kg/m.   Physical Exam: General exam: Pleasant, elderly Caucasian male.  Feels uncomfortable.  Nonspecific symptoms. Skin: No rashes, lesions or ulcers. HEENT: Atraumatic, normocephalic, no obvious bleeding Lungs: Clear to auscultation bilaterally. CVS: Regular rate and rhythm, no murmur GI/Abd soft, nontender, nondistended, bowel sound present CNS: Alert, awake, slow to respond, oriented to place and person Psychiatry: Depressed look Extremities: No pedal edema, no calf tenderness  Data Review: I have personally reviewed the laboratory data and studies available.  Recent Labs  Lab 05/23/21 0030 05/23/21 0521 05/25/21 0729 05/25/21 1418 05/26/21 0058 05/27/21 0343 05/28/21 0420 05/29/21 0420  WBC 10.3   < > 10.5  --  11.4* 11.2* 12.5* 12.9*  NEUTROABS 7.7  --   --   --   --   --   --  10.6*  HGB 8.2*   < > 7.0* 6.6* 8.5* 7.6* 7.1* 6.5*  HCT 25.1*   < > 21.5* 19.9* 25.4* 23.4* 21.8* 20.0*  MCV 95.8   < > 96.0  --  95.1 95.9 96.5 99.0  PLT 337   < > 265  --  271 237 240 193   < > = values in this interval not displayed.    Recent Labs  Lab 05/23/21 0521 05/23/21 1518 05/24/21 0121 05/24/21 1014 05/24/21 1732 05/25/21 0729 05/26/21 0058 05/27/21 0343 05/28/21 0420 05/29/21 0420  NA 136   < > 136 135   < > 137 137 138 138 138  K 5.9*   < > 6.2* 6.2*   < > 5.1 5.0 4.5 4.5 4.0  CL 104   < > 106 106   < > 108 109 107 105 102  CO2 22   < > 21* 20*   < > 19* 18* 19* 19* 23  GLUCOSE 59*   < >  89 81   < > 94 96 82 90 96  BUN 94*   < > 89* 90*   < > 86* 87* 93* 99* 72*  CREATININE 10.52*   < > 9.52* 9.60*   < > 9.12* 9.09* 9.48* 9.36* 7.89*  CALCIUM 9.4   < > 8.4* 8.4*   < > 8.1* 8.3* 8.2* 8.3* 8.0*  MG 2.3  --  2.1  --   --  2.1 1.9 2.0 2.1  --   PHOS 4.8*  --  5.3* 5.3*  --   --   --   --   --   --    < > = values in this interval not displayed.     F/u labs ordered Unresulted Labs (From admission, onward)     Start     Ordered   05/29/21 0500  CBC with Differential/Platelet  Daily,   R     Question:  Specimen collection method  Answer:  Lab=Lab collect   05/28/21 1437   05/29/21 8250  Basic metabolic panel  Daily,   R     Question:  Specimen collection method  Answer:  Lab=Lab collect   05/28/21 1437   05/25/21 0500  Protime-INR  Daily,   R      05/24/21 0816            Signed, Terrilee Croak, MD [facility] 05/29/2021

## 2021-05-29 NOTE — Progress Notes (Signed)
New orders received for type and screen and to transfuse 1 unit PRBC's .

## 2021-05-30 DIAGNOSIS — N179 Acute kidney failure, unspecified: Secondary | ICD-10-CM | POA: Diagnosis not present

## 2021-05-30 DIAGNOSIS — N170 Acute kidney failure with tubular necrosis: Secondary | ICD-10-CM | POA: Diagnosis not present

## 2021-05-30 DIAGNOSIS — N186 End stage renal disease: Secondary | ICD-10-CM | POA: Diagnosis not present

## 2021-05-30 DIAGNOSIS — I132 Hypertensive heart and chronic kidney disease with heart failure and with stage 5 chronic kidney disease, or end stage renal disease: Secondary | ICD-10-CM | POA: Diagnosis not present

## 2021-05-30 DIAGNOSIS — Z20822 Contact with and (suspected) exposure to covid-19: Secondary | ICD-10-CM | POA: Diagnosis not present

## 2021-05-30 DIAGNOSIS — N189 Chronic kidney disease, unspecified: Secondary | ICD-10-CM | POA: Diagnosis not present

## 2021-05-30 LAB — BASIC METABOLIC PANEL
Anion gap: 10 (ref 5–15)
BUN: 82 mg/dL — ABNORMAL HIGH (ref 8–23)
CO2: 25 mmol/L (ref 22–32)
Calcium: 8 mg/dL — ABNORMAL LOW (ref 8.9–10.3)
Chloride: 100 mmol/L (ref 98–111)
Creatinine, Ser: 8.61 mg/dL — ABNORMAL HIGH (ref 0.61–1.24)
GFR, Estimated: 6 mL/min — ABNORMAL LOW (ref >60)
Glucose, Bld: 109 mg/dL — ABNORMAL HIGH (ref 70–99)
Potassium: 4 mmol/L (ref 3.5–5.1)
Sodium: 135 mmol/L (ref 135–145)

## 2021-05-30 LAB — CBC WITH DIFFERENTIAL/PLATELET
Abs Immature Granulocytes: 0.1 10*3/uL — ABNORMAL HIGH (ref 0.00–0.07)
Basophils Absolute: 0 10*3/uL (ref 0.0–0.1)
Basophils Relative: 0 %
Eosinophils Absolute: 0.3 10*3/uL (ref 0.0–0.5)
Eosinophils Relative: 2 %
HCT: 22.7 % — ABNORMAL LOW (ref 39.0–52.0)
Hemoglobin: 7.2 g/dL — ABNORMAL LOW (ref 13.0–17.0)
Immature Granulocytes: 1 %
Lymphocytes Relative: 5 %
Lymphs Abs: 0.7 10*3/uL (ref 0.7–4.0)
MCH: 31.2 pg (ref 26.0–34.0)
MCHC: 31.7 g/dL (ref 30.0–36.0)
MCV: 98.3 fL (ref 80.0–100.0)
Monocytes Absolute: 1.5 10*3/uL — ABNORMAL HIGH (ref 0.1–1.0)
Monocytes Relative: 11 %
Neutro Abs: 11.1 10*3/uL — ABNORMAL HIGH (ref 1.7–7.7)
Neutrophils Relative %: 81 %
Platelets: 201 10*3/uL (ref 150–400)
RBC: 2.31 MIL/uL — ABNORMAL LOW (ref 4.22–5.81)
RDW: 14.6 % (ref 11.5–15.5)
WBC: 13.7 10*3/uL — ABNORMAL HIGH (ref 4.0–10.5)
nRBC: 0 % (ref 0.0–0.2)

## 2021-05-30 LAB — GLUCOSE, CAPILLARY
Glucose-Capillary: 88 mg/dL (ref 70–99)
Glucose-Capillary: 91 mg/dL (ref 70–99)
Glucose-Capillary: 87 mg/dL (ref 70–99)
Glucose-Capillary: 97 mg/dL (ref 70–99)
Glucose-Capillary: 97 mg/dL (ref 70–99)

## 2021-05-30 LAB — PROTIME-INR
INR: 1.9 — ABNORMAL HIGH (ref 0.8–1.2)
Prothrombin Time: 21.8 seconds — ABNORMAL HIGH (ref 11.4–15.2)

## 2021-05-30 MED ORDER — HEPARIN SODIUM (PORCINE) 1000 UNIT/ML IJ SOLN
INTRAMUSCULAR | Status: AC
  Administered 2021-05-30: 3000 [IU]
  Filled 2021-05-30: qty 3

## 2021-05-30 MED ORDER — AMLODIPINE BESYLATE 5 MG PO TABS
5.0000 mg | ORAL_TABLET | Freq: Every day | ORAL | Status: DC
  Administered 2021-05-30 – 2021-06-02 (×4): 5 mg via ORAL
  Filled 2021-05-30 (×7): qty 1

## 2021-05-30 MED ORDER — WARFARIN SODIUM 2.5 MG PO TABS
2.5000 mg | ORAL_TABLET | Freq: Once | ORAL | Status: AC
  Administered 2021-05-30: 2.5 mg via ORAL
  Filled 2021-05-30: qty 1

## 2021-05-30 MED ORDER — IPRATROPIUM-ALBUTEROL 0.5-2.5 (3) MG/3ML IN SOLN
RESPIRATORY_TRACT | Status: AC
  Filled 2021-05-30: qty 3

## 2021-05-30 MED ORDER — WARFARIN - PHARMACIST DOSING INPATIENT: Freq: Every day | Status: DC

## 2021-05-30 NOTE — Progress Notes (Signed)
   05/30/21 1120  Vitals  Temp 98.2 F (36.8 C)  Temp Source Oral  BP (!) 170/63  BP Location Right Arm  BP Method Automatic  Patient Position (if appropriate) Lying  Pulse Rate 61  Pulse Rate Source Monitor  Resp 17  Oxygen Therapy  SpO2 99 %  O2 Device Nasal Cannula  O2 Flow Rate (L/min) 2.5 L/min  Post-Hemodialysis Assessment  Rinseback Volume (mL) 250 mL  KECN 224 V  Dialyzer Clearance Lightly streaked  Duration of HD Treatment -hour(s) 3 hour(s)  Hemodialysis Intake (mL) 500 mL  UF Total -Machine (mL) 2500 mL  Net UF (mL) 2000 mL  Tolerated HD Treatment Yes  Post-Hemodialysis Comments tx completed pt stable  Hemodialysis Catheter Right Internal jugular Double lumen Temporary (Non-Tunneled)  Placement Date/Time: 05/27/21 0915   Time Out: Correct patient;Correct site;Correct procedure  Maximum sterile barrier precautions: Hand hygiene;Cap;Mask;Sterile gown;Sterile gloves;Large sterile sheet  Site Prep: Chlorhexidine (preferred)  Local Anes...  Site Condition No complications  Blue Lumen Status Flushed;Heparin locked  Red Lumen Status Flushed;Heparin locked  Catheter fill solution Heparin 1000 units/ml  Catheter fill volume (Arterial) 1.5 cc  Catheter fill volume (Venous) 1.5  Dressing Type Transparent  Dressing Status Clean;Dry;Intact  Antimicrobial disc in place? Yes  Interventions Other (Comment) (assessed)  Drainage Description None  Dressing Change Due 06/04/21  Post treatment catheter status Capped and Clamped  Pt tolerated hd tx fairly well. Pt noted with c/o SHOB and dyspnea pre and throughout tx. Neb tx given per RT, O2 noted at 3 liters continuous, pt with O2 sats>92%. Attending and Dr. Joelyn Oms on unit to assess, made aware of the above. EKG completed and Dr. Pietro Cassis notified. UF goal increased to 2 liters per Dr. Joelyn Oms. Goal met. Pt stable at present time.

## 2021-05-30 NOTE — Progress Notes (Signed)
[facility] NEPHROLOGY PROGRESS NOTE  Assessment/ Plan: Pt is a 80 y.o. yo male  with medical history of hypertension, COPD, CAD status post CABG, A. fib on anticoagulation, CHF, CKD 4 who was presented to the ER for abnormal lab results (sent by his urologist) seen as a consultation for the evaluation of AKI on CKD and hyperkalemia.  #Dialysis dependent AKI with CKD4 :  Likely 2/2 ischemic ATN due to dehydration/decreased oral intake concomitant with the use of Entresto, Aldactone and Jardiance in atrophic left kidney/bilateral renal artery stenosis. Bladder scan and kidney ultrasound without obstruction, has left renal atrophy.  UA with blood and around 1.9 g of proteinuria.     Started HD 8/25, Tx #2 8/27 Received temporary HD cath with IR 8/24, will need tunneled catheter when INR is permissive; IR following Next Tx 8/29 or 8/30, based on status Continue to hold Jardiance, Entresto and Aldactone.    #Hyperkalemia: Resolved, CTM  #Hypertension: Trend.  Continue carvedilol and hydralazine.     #Hematuria: Seen by urologist and concern about possible right renal pelvis mass.  Recommending outpt eval. Per Urology.     #Anemia due to blood loss/hematuria and chronic illness/CKD: Transfuse as needed. For now, no ESA because of possible pelvic mass.    #History of CHF: not overloaded.  Holding cardiac meds because of AKI.  Subjective:  Seen on HD, Tx #2;  c/o dyspnea, just rec neb, 2L [place] nl SpO2 No inc in UOP, change in SCr/BUN consistent with dialysis dependence INR 1.9 this AM  Objective Vital signs in last 24 hours: Vitals:   05/30/21 0900 05/30/21 0917 05/30/21 0930 05/30/21 1000  BP: (!) 167/62  (!) 169/59 (!) 188/69  Pulse: 62 61 60 (!) 59  Resp: 14 18 14 16   Temp:      TempSrc:      SpO2:  98%    Weight:      Height:       Weight change: 2.4 kg  Intake/Output Summary (Last 24 hours) at 05/30/2021 1025 Last data filed at 05/30/2021 0600 Gross per 24 hour   Intake 1101 ml  Output 300 ml  Net 801 ml        Labs: Basic Metabolic Panel: Recent Labs  Lab 05/24/21 0121 05/24/21 1014 05/24/21 1732 05/28/21 0420 05/29/21 0420 05/30/21 0257  NA 136 135   < > 138 138 135  K 6.2* 6.2*   < > 4.5 4.0 4.0  CL 106 106   < > 105 102 100  CO2 21* 20*   < > 19* 23 25  GLUCOSE 89 81   < > 90 96 109*  BUN 89* 90*   < > 99* 72* 82*  CREATININE 9.52* 9.60*   < > 9.36* 7.89* 8.61*  CALCIUM 8.4* 8.4*   < > 8.3* 8.0* 8.0*  PHOS 5.3* 5.3*  --   --   --   --    < > = values in this interval not displayed.    Liver Function Tests: Recent Labs  Lab 05/24/21 1014  ALBUMIN 2.5*    No results for input(s): LIPASE, AMYLASE in the last 168 hours. No results for input(s): AMMONIA in the last 168 hours. CBC: Recent Labs  Lab 05/26/21 0058 05/27/21 0343 05/28/21 0420 05/29/21 0420 05/30/21 0257  WBC 11.4* 11.2* 12.5* 12.9* 13.7*  NEUTROABS  --   --   --  10.6* 11.1*  HGB 8.5* 7.6* 7.1* 6.5* 7.2*  HCT 25.4*  23.4* 21.8* 20.0* 22.7*  MCV 95.1 95.9 96.5 99.0 98.3  PLT 271 237 240 193 201    Cardiac Enzymes: No results for input(s): CKTOTAL, CKMB, CKMBINDEX, TROPONINI in the last 168 hours. CBG: Recent Labs  Lab 05/29/21 1207 05/29/21 1701 05/29/21 2116 05/30/21 0642 05/30/21 0710  GLUCAP 95 107* 126* 91 88     Iron Studies:  No results for input(s): IRON, TIBC, TRANSFERRIN, FERRITIN in the last 72 hours.  Studies/Results: No results found.  Medications: Infusions:    Scheduled Medications:  sodium chloride   Intravenous Once   amLODipine  5 mg Oral Daily   budesonide  0.25 mg Nebulization BID   carvedilol  3.125 mg Oral BID WC   Chlorhexidine Gluconate Cloth  6 each Topical Daily   feeding supplement (NEPRO CARB STEADY)  237 mL Oral TID BM   ferrous sulfate  325 mg Oral Q breakfast   insulin aspart  0-5 Units Subcutaneous QHS   insulin aspart  0-6 Units Subcutaneous TID WC   ipratropium-albuterol  3 mL Nebulization  BID   ipratropium-albuterol       levothyroxine  112 mcg Oral Q0600   multivitamin with minerals  1 tablet Oral Daily   pantoprazole  40 mg Oral BID   pravastatin  40 mg Oral q1800    have reviewed scheduled and prn medications.  Physical Exam: General:NAD, comfortable, lying flat in bed Heart:RRR, s1s2 nl Lungs:clear b/l, no crackle Abdomen:soft, Non-tender, non-distended Extremities:No edema Neurology: Alert awake and following commands.,  No clear asterixis NECK: R IJ Temp HD cath bandaged and clean, no blood  Lorrie Strauch B Ricca Melgarejo 05/30/2021,10:25 AM  LOS: 7 days

## 2021-05-30 NOTE — Progress Notes (Addendum)
PT Cancellation Note  Patient Details Name: [name] [name] MRN: [id number] DOB: 1941/01/06   Cancelled Treatment:    Reason Eval/Treat Not Completed: Patient at procedure or test/unavailable (HD). Attempted to see pt for second time this afternoon and pt refusing due to fatigue.   Wyona Almas, PT, DPT Acute Rehabilitation Services Pager [phone] Office [phone]    Deno Etienne 05/30/2021, 8:44 AM

## 2021-05-30 NOTE — Progress Notes (Signed)
ANTICOAGULATION CONSULT NOTE - Follow Up Consult  Pharmacy Consult for Warfarin Indication: atrial fibrillation  No Known Allergies  Patient Measurements: Height: 5\' 9"  (175.3 cm) Weight: 84.2 kg (185 lb 10 oz) IBW/kg (Calculated) : 70.7  Vital Signs: Temp: 98.7 F (37.1 C) (08/27 0805) Temp Source: Oral (08/27 0805) BP: 169/59 (08/27 0930) Pulse Rate: 60 (08/27 0930)  Labs: Recent Labs    05/28/21 0420 05/29/21 0420 05/30/21 0257  HGB 7.1* 6.5* 7.2*  HCT 21.8* 20.0* 22.7*  PLT 240 193 201  LABPROT 35.9* 25.7* 21.8*  INR 3.6* 2.4* 1.9*  CREATININE 9.36* 7.89* 8.61*     Estimated Creatinine Clearance: 6.7 mL/min (A) (by C-G formula based on SCr of 8.61 mg/dL (H)).   Medical History: Past Medical History:  Diagnosis Date   AICD (automatic cardioverter/defibrillator) present 04/05/2017   Anemia    Arthritis    "hips; back" (07/09/2014)   Asthma    Atrial fibrillation (HCC)    CAD (coronary artery disease)    CHF (congestive heart failure) ([place])    Cholecystitis 11/2013   CKD (chronic kidney disease) 2015   Stage  3.    COPD (chronic obstructive pulmonary disease) (HCC)    on home oxygen, 2 liters at night10/2015   Esophageal reflux    Gallstones    Gastric antral vascular ectasia 2013   Gout    Heme positive stool    Hepatomegaly    Hiatal hernia    History of blood transfusion ~ 2012   "blood count dropped; had to get 3 units"   Hyperlipidemia    Hypothyroidism    Other and unspecified coagulation defects    Personal history of colonic polyps 05/29/2010   TUBULAR ADENOMA   Unspecified essential hypertension     Medications:  Medications Prior to Admission  Medication Sig Dispense Refill Last Dose   acetaminophen (TYLENOL) 500 MG tablet Take 500 mg by mouth every 6 (six) hours as needed for headache or mild pain.   05/22/2021   albuterol (VENTOLIN HFA) 108 (90 Base) MCG/ACT inhaler USE 2 INHALATIONS BY MOUTH  EVERY 6 HOURS AS NEEDED FOR SHORTNESS OF  BREATH (Patient taking differently: Inhale 2 puffs into the lungs every 6 (six) hours as needed for shortness of breath.) 34 g 0 05/23/2021   budesonide (PULMICORT) 0.25 MG/2ML nebulizer solution USE 1 VIAL  IN  NEBULIZER TWICE  DAILY - rinse mouth after treatment (Patient taking differently: Take 0.25 mg by nebulization See admin instructions. USE 1 VIAL  IN  NEBULIZER TWICE DAILY - rinse mouth after treatment) 6 mL 5 05/23/2021   carvedilol (COREG) 6.25 MG tablet Take 1.5 tablets (9.375 mg total) by mouth 2 (two) times daily. 270 tablet 0 05/22/2021 at 2000   Cholecalciferol (VITAMIN D3) 5000 UNITS CAPS Take 5,000 Units by mouth daily.   05/22/2021   docusate sodium (COLACE) 100 MG capsule Take 100 mg by mouth daily as needed for mild constipation.   Past Month   empagliflozin (JARDIANCE) 10 MG TABS tablet Take 1 tablet (10 mg total) by mouth daily before breakfast. 30 tablet 11 05/22/2021   ferrous sulfate 325 (65 FE) MG tablet Take 325 mg by mouth daily with breakfast.   05/22/2021   hydrALAZINE (APRESOLINE) 50 MG tablet TAKE 1 TABLET BY MOUTH 3  TIMES DAILY (Patient taking differently: Take 50 mg by mouth 3 (three) times daily.) 270 tablet 3 05/22/2021   HYDROcodone-acetaminophen (NORCO) 5-325 MG tablet Take 2 tablets by mouth every 4 (four)  hours as needed. (Patient taking differently: Take 1-2 tablets by mouth every 4 (four) hours as needed for moderate pain.) 10 tablet 0 05/22/2021   Hypromell-Glycerin-Naphazoline 0.8-0.25-0.012 % SOLN Place 1-2 drops into both eyes 3 (three) times daily as needed (for dry eyes.).   Past Month   ipratropium-albuterol (DUONEB) 0.5-2.5 (3) MG/3ML SOLN USE 1 VIAL IN NEBULIZER 4 TIMES DAILY (Patient taking differently: Take 3 mLs by nebulization 4 (four) times daily.) 9 mL 5 05/23/2021   levothyroxine (SYNTHROID) 112 MCG tablet TAKE 1 TABLET BY MOUTH  DAILY BEFORE BREAKFAST (Patient taking differently: Take 112 mcg by mouth daily before breakfast.) 90 tablet 3 05/22/2021    lovastatin (MEVACOR) 40 MG tablet Take 2 tablets (80 mg total) by mouth at bedtime. 180 tablet 3 05/22/2021   Multiple Vitamins-Minerals (MULTIVITAMINS THER. W/MINERALS) TABS Take 1 tablet by mouth daily.   05/22/2021   nitroGLYCERIN (NITROSTAT) 0.4 MG SL tablet Place 1 tablet (0.4 mg total) under the tongue every 5 (five) minutes as needed for chest pain. 6 tablet 1 unk   OXYGEN Inhale 2 L/min into the lungs See admin instructions. 2 L/min at bedtime and as needed daily as needed for shortness of breath      pantoprazole (PROTONIX) 40 MG tablet Take 1 tablet (40 mg total) by mouth 2 (two) times daily. (Patient taking differently: Take 40 mg by mouth in the morning and at bedtime.) 180 tablet 2 05/22/2021   sacubitril-valsartan (ENTRESTO) 97-103 MG Take 1 tablet by mouth 2 (two) times daily. 180 tablet 3 05/22/2021   spironolactone (ALDACTONE) 25 MG tablet Take 0.5 tablets (12.5 mg total) by mouth daily. 30 tablet 3 05/22/2021   sucralfate (CARAFATE) 1 g tablet TAKE 1 TABLET BY MOUTH 4  TIMES DAILY 1 HOUR BEFORE  MEALS AND AT BEDTIME (Patient taking differently: Take 1 g by mouth See admin instructions. Take 1 gram by mouth four times a day- ONE HOUR before meals and at bedtime) 360 tablet 1 05/22/2021   warfarin (COUMADIN) 5 MG tablet TAKE 1 TABLET BY MOUTH  EVERY MONDAY, WEDNESDAY,  AND FRIDAY AND ONE-HALF  TABLET BY MOUTH ON THE  REMAINING DAYS OF THE WEEK (Patient taking differently: Take 2.5 mg by mouth daily.) 65 tablet 0 05/22/2021 at 0900   cephALEXin (KEFLEX) 500 MG capsule Take 1 capsule (500 mg total) by mouth 4 (four) times daily. (Patient not taking: No sig reported) 28 capsule 0 Completed Course   fluticasone furoate-vilanterol (BREO ELLIPTA) 200-25 MCG/INH AEPB Inhale 1 puff into the lungs daily. (Patient not taking: Reported on 05/23/2021) 28 each 2 Not Taking   loratadine (CLARITIN) 10 MG tablet Take 10 mg by mouth daily as needed (for allergies.).    unk   Scheduled:    Assessment: 80yo  male on warfarin PTA for Afib. Admit INR 2.7 was therapeutic on PTA regimen of warfarin 5mg  Tue and Fri, 2.5 mg all other days (recently changed 8/18).  Per chart review, patient experienced bleeding from the site of RIJ temporary dialysis catheter inserted on 8/24 and hematuria on 8/26. Patient received vitamin K 1 mg IV x1 on 8/25. Hemoglobin decreased 7.1 > 6.5, warfarin was then held 8/26 and patient received 1 unit of pRBCs.   Discussed with team, no further bleeding reported, h/h recovered. INR now < 2.0. Will resume warfarin today post-HD. At the request of the provider, will restart warfarin earlier at ~1200 instead of 1600 pending return from HD.  Goal of Therapy:  INR 2-3 Monitor  platelets by anticoagulation protocol: Yes   Plan:  - Resume warfarin 2.5mg  daily - F/u daily INR, CBC - Monitor for s/sx of bleeding  Thank you for allowing pharmacy to be a part of this patient's care.  Ardyth Harps, PharmD Clinical Pharmacist

## 2021-05-30 NOTE — Progress Notes (Signed)
Pt requesting to have neb tx rescheduled to around 2300, wants to get some sleep first. RT will check back later.

## 2021-05-30 NOTE — Progress Notes (Signed)
PROGRESS NOTE  [name] [name]  DOB: 01-Sep-1941  PCP: Claretta Fraise, MD DPO:[id number]  DOA: 05/22/2021  LOS: 7 days  Hospital Day: 9  Chief complaint: abnormal labs  Brief narrative: [name] [name] is a 80 y.o. male with PMH significant for CAD s/p CABG, atrial fibrillation, CHF, AICD, COPD, CKD stage IIIb. Patient presented to the ED on 8/19 from his PCPs office for abnormal labs He had elevated potassium and creatinine. CT abdomen and pelvis showed mild right hydronephrosis. Admitted to hospitalist service.   Nephrology and urology were consulted.   See below for details  Subjective: Patient was seen and examined this morning in dialysis unit. Continues to complain of shortness of breath, not tachypneic, maintaining saturation on 2 L oxygen which is his home requirement.  Gives history of asthma.  Getting nebulizers in dialysis unit. Your evaluated pink-colored minimal amount of urine.  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 -Creatinine did improve with conservative measures -Nephrology consult appreciated. -8/24, patient underwent triple-lumen catheter placement and is started on dialysis. -On his third session of dialysis this morning.  Dyspnea History of COPD/asthma -Clear to auscultation.  Respiratory rate normal, oxygen saturation maintained on 3 L of oxygen which is his home requirement. -Continue DuoNeb, Pulmicort and albuterol -Obtain EKG.  Bleeding from the triple-lumen catheter site Supratherapeutic INR -On 8/25, patient had significant bleeding last 24 hours from the triple-lumen catheter site probably because of supratherapeutic INR.  He was given a dose of desmopressin and 1 dose of vitamin K IV 1 mg.  INR now improving, 1.9 this morning.  Can resume Coumadin  today.  Monitor for further bleeding. Recent Labs  Lab 05/26/21 0058 05/27/21 0343 05/28/21 0420 05/29/21 0420 05/30/21 0257  INR 3.1* 3.7* 3.6* 2.4* 1.9*   Hematuria -Patient had hematuria on the first few days of admission and it resolved.  Bladder scan did not show any obstruction. UA showed proteinuria and hematuria.  Urology recommended outpatient work-up.   -However on 8/25, he started have hematuria again.  Probably related to supratherapeutic INR.  Urobag with pink-colored minimal amount of urine today.  Acute on chronic anemia -On 8/26, hemoglobin was low at 6.5 and 1 unit of PRBC was given.  This is his second unit of PRBC during this hospitalization.  Hemoglobin up to 7.2 today.  Continue to monitor Recent Labs    10/23/20 1200 05/13/21 1325 05/24/21 0121 05/25/21 0729 05/26/21 0058 05/27/21 0343 05/28/21 0420 05/29/21 0420 05/30/21 0257  HGB  --    < > 7.0*   < > 8.5* 7.6* 7.1* 6.5* 7.2*  MCV  --    < > 94.0   < > 95.1 95.9 96.5 99.0 98.3  VITAMINB12 464  --   --   --   --   --   --   --   --   FERRITIN  --   --  129  --   --   --   --   --   --   TIBC  --   --  189*  --   --   --   --   --   --   IRON  --   --  44*  --   --   --   --   --   --    < > =  values in this interval not displayed.   CHF Essential hypertension -Home med includes Coreg, Hydralazine and amlodipine.   -Blood pressure elevated to 160s this morning.  Currently on Coreg at a reduced dose.  Resume amlodipine.  -Continue to hold hydralazine, Entresto and spironolactone.  A. Fib -Heart rate controlled on Coreg.  Plan for Coumadin as above.  CAD s/p AICD: -Continue Coreg and statin. last device check was 04/02/21  Carotid artery stenosis -Follows with vascular surgery.   Hyperkalemia/hyperphosphatemia -Corrected after dialysis was initiated.  Moderate protein calorie malnutrition Hypoalbuminemia -Continue protein supplements.  Hypothyroidism -Continue Synthroid   GERD -Continue  Protonix    Mobility: Encourage ambulation.  PT eval pending Code Status:   Code Status: Full Code  Nutritional status: Body mass index is 27.41 kg/m. Nutrition Problem: Increased nutrient needs Etiology: acute illness (AKI) Signs/Symptoms: estimated needs Diet:  Diet Order             Diet Heart Room service appropriate? Yes; Fluid consistency: Thin  Diet effective now                  DVT prophylaxis:  SCDs Start: 05/23/21 6010   Antimicrobials: None Fluid: None Consultants: Nephrology Family Communication: Wife at bedside  Status is: Inpatient  Remains inpatient appropriate because: Newly initiated on dialysis.  Dispo: The patient is from: Home              Anticipated d/c is to: Likely home in next several days.  PT eval pending.              Patient currently is not medically stable to d/c.   Difficult to place patient No     Infusions:     Scheduled Meds:  sodium chloride   Intravenous Once   budesonide  0.25 mg Nebulization BID   carvedilol  3.125 mg Oral BID WC   Chlorhexidine Gluconate Cloth  6 each Topical Daily   feeding supplement (NEPRO CARB STEADY)  237 mL Oral TID BM   ferrous sulfate  325 mg Oral Q breakfast   insulin aspart  0-5 Units Subcutaneous QHS   insulin aspart  0-6 Units Subcutaneous TID WC   ipratropium-albuterol  3 mL Nebulization BID   ipratropium-albuterol       levothyroxine  112 mcg Oral Q0600   multivitamin with minerals  1 tablet Oral Daily   pantoprazole  40 mg Oral BID   pravastatin  40 mg Oral q1800    Antimicrobials: Anti-infectives (From admission, onward)    None       PRN meds: acetaminophen, albuterol, dextromethorphan-guaiFENesin, lidocaine (PF), senna-docusate, sodium chloride flush, traZODone   Objective: Vitals:   05/30/21 0900 05/30/21 0917  BP: (!) 167/62   Pulse: 62 61  Resp: 14 18  Temp:    SpO2:  98%    Intake/Output Summary (Last 24 hours) at 05/30/2021 0940 Last data filed at  05/30/2021 0600 Gross per 24 hour  Intake 1101 ml  Output 300 ml  Net 801 ml    Filed Weights   05/29/21 0506 05/29/21 2013 05/30/21 0805  Weight: 83.6 kg 83.6 kg 84.2 kg   Weight change: 2.4 kg Body mass index is 27.41 kg/m.   Physical Exam: General exam: Pleasant, elderly Caucasian male.  Comes in mild shortness of breath.  On 3 L oxygen. Skin: No rashes, lesions or ulcers. HEENT: Atraumatic, normocephalic, no obvious bleeding Lungs: Clear to auscultation bilaterally. CVS: Regular rate and rhythm, no murmur GI/Abd soft,  nontender, nondistended, bowel sound present CNS: Alert, awake, slow to respond, oriented to place and person Psychiatry: Depressed look Extremities: No pedal edema, no calf tenderness  Data Review: I have personally reviewed the laboratory data and studies available.  Recent Labs  Lab 05/26/21 0058 05/27/21 0343 05/28/21 0420 05/29/21 0420 05/30/21 0257  WBC 11.4* 11.2* 12.5* 12.9* 13.7*  NEUTROABS  --   --   --  10.6* 11.1*  HGB 8.5* 7.6* 7.1* 6.5* 7.2*  HCT 25.4* 23.4* 21.8* 20.0* 22.7*  MCV 95.1 95.9 96.5 99.0 98.3  PLT 271 237 240 193 201    Recent Labs  Lab 05/24/21 0121 05/24/21 1014 05/24/21 1732 05/25/21 0729 05/26/21 0058 05/27/21 0343 05/28/21 0420 05/29/21 0420 05/30/21 0257  NA 136 135   < > 137 137 138 138 138 135  K 6.2* 6.2*   < > 5.1 5.0 4.5 4.5 4.0 4.0  CL 106 106   < > 108 109 107 105 102 100  CO2 21* 20*   < > 19* 18* 19* 19* 23 25  GLUCOSE 89 81   < > 94 96 82 90 96 109*  BUN 89* 90*   < > 86* 87* 93* 99* 72* 82*  CREATININE 9.52* 9.60*   < > 9.12* 9.09* 9.48* 9.36* 7.89* 8.61*  CALCIUM 8.4* 8.4*   < > 8.1* 8.3* 8.2* 8.3* 8.0* 8.0*  MG 2.1  --   --  2.1 1.9 2.0 2.1  --   --   PHOS 5.3* 5.3*  --   --   --   --   --   --   --    < > = values in this interval not displayed.     F/u labs ordered Unresulted Labs (From admission, onward)     Start     Ordered   05/29/21 0500  CBC with Differential/Platelet  Daily,    R     Question:  Specimen collection method  Answer:  Lab=Lab collect   05/28/21 1437   05/29/21 1610  Basic metabolic panel  Daily,   R     Question:  Specimen collection method  Answer:  Lab=Lab collect   05/28/21 1437   05/25/21 0500  Protime-INR  Daily,   R      05/24/21 0816            Signed, Terrilee Croak, MD [facility] 05/30/2021

## 2021-05-31 DIAGNOSIS — N189 Chronic kidney disease, unspecified: Secondary | ICD-10-CM | POA: Diagnosis not present

## 2021-05-31 DIAGNOSIS — N179 Acute kidney failure, unspecified: Secondary | ICD-10-CM | POA: Diagnosis not present

## 2021-05-31 LAB — BASIC METABOLIC PANEL
Anion gap: 8 (ref 5–15)
BUN: 45 mg/dL — ABNORMAL HIGH (ref 8–23)
CO2: 28 mmol/L (ref 22–32)
Calcium: 7.9 mg/dL — ABNORMAL LOW (ref 8.9–10.3)
Chloride: 99 mmol/L (ref 98–111)
Creatinine, Ser: 5.78 mg/dL — ABNORMAL HIGH (ref 0.61–1.24)
GFR, Estimated: 9 mL/min — ABNORMAL LOW (ref >60)
Glucose, Bld: 107 mg/dL — ABNORMAL HIGH (ref 70–99)
Potassium: 3.5 mmol/L (ref 3.5–5.1)
Sodium: 135 mmol/L (ref 135–145)

## 2021-05-31 LAB — CBC WITH DIFFERENTIAL/PLATELET
Abs Immature Granulocytes: 0.07 10*3/uL (ref 0.00–0.07)
Basophils Absolute: 0.1 10*3/uL (ref 0.0–0.1)
Basophils Relative: 1 %
Eosinophils Absolute: 0.3 10*3/uL (ref 0.0–0.5)
Eosinophils Relative: 3 %
HCT: 21.9 % — ABNORMAL LOW (ref 39.0–52.0)
Hemoglobin: 6.9 g/dL — CL (ref 13.0–17.0)
Immature Granulocytes: 1 %
Lymphocytes Relative: 6 %
Lymphs Abs: 0.6 10*3/uL — ABNORMAL LOW (ref 0.7–4.0)
MCH: 30.9 pg (ref 26.0–34.0)
MCHC: 31.5 g/dL (ref 30.0–36.0)
MCV: 98.2 fL (ref 80.0–100.0)
Monocytes Absolute: 1.2 10*3/uL — ABNORMAL HIGH (ref 0.1–1.0)
Monocytes Relative: 12 %
Neutro Abs: 7.7 10*3/uL (ref 1.7–7.7)
Neutrophils Relative %: 77 %
Platelets: 217 10*3/uL (ref 150–400)
RBC: 2.23 MIL/uL — ABNORMAL LOW (ref 4.22–5.81)
RDW: 14.4 % (ref 11.5–15.5)
WBC: 10 10*3/uL (ref 4.0–10.5)
nRBC: 0 % (ref 0.0–0.2)

## 2021-05-31 LAB — GLUCOSE, CAPILLARY
Glucose-Capillary: 98 mg/dL (ref 70–99)
Glucose-Capillary: 121 mg/dL — ABNORMAL HIGH (ref 70–99)
Glucose-Capillary: 101 mg/dL — ABNORMAL HIGH (ref 70–99)
Glucose-Capillary: 98 mg/dL (ref 70–99)

## 2021-05-31 LAB — PROTIME-INR
INR: 1.8 — ABNORMAL HIGH (ref 0.8–1.2)
Prothrombin Time: 21.1 seconds — ABNORMAL HIGH (ref 11.4–15.2)

## 2021-05-31 LAB — PREPARE RBC (CROSSMATCH)

## 2021-05-31 MED ORDER — SODIUM CHLORIDE 0.9% IV SOLUTION: Freq: Once | INTRAVENOUS | Status: DC

## 2021-05-31 MED ORDER — WARFARIN SODIUM 2.5 MG PO TABS
2.5000 mg | ORAL_TABLET | Freq: Once | ORAL | Status: AC
  Administered 2021-05-31: 2.5 mg via ORAL
  Filled 2021-05-31: qty 1

## 2021-05-31 NOTE — Progress Notes (Signed)
PROGRESS NOTE  [name] [name]  DOB: [birth year]  PCP: Claretta Fraise, MD UYQ:[id number]  DOA: 05/22/2021  LOS: 8 days  Hospital Day: 10  Chief complaint: abnormal labs  Brief narrative: [name] is a 80 y.o. male with PMH significant for CAD s/p CABG, atrial fibrillation, CHF, AICD, COPD, CKD stage IIIb. Patient presented to the ED on 8/19 from his PCPs office for abnormal labs He had elevated potassium and creatinine. CT abdomen and pelvis showed mild right hydronephrosis. Admitted to hospitalist service.   Nephrology and urology were consulted.   See below for details  Subjective: Patient was seen and examined this morning. Lying on bed.  Not in distress.  He does not any longer have the feeling of chest tightness or shortness of breath like yesterday.  Remains at baseline oxygen requirement. Urobag has minimal amount of blood-tinged urine.  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 -Creatinine improved with conservative measures -8/24, patient underwent triple-lumen catheter placement and has been started on dialysis. -Nephrology following.  History of COPD/asthma -Respiratory status stable at this time. -Continue DuoNeb, Pulmicort and albuterol  Bleeding from the triple-lumen catheter site Supratherapeutic INR -On 8/25, patient had significant bleeding last 24 hours from the triple-lumen catheter site probably because of supratherapeutic INR.  He was given a dose of desmopressin and 1 dose of vitamin K IV 1 mg.  INR improved and became subtherapeutic, 1.8 this morning.  Coumadin has been resumed.   Recent Labs  Lab 05/27/21 0343 05/28/21 0420 05/29/21 0420 05/30/21 0257 05/31/21 0342  INR 3.7* 3.6* 2.4* 1.9* 1.8*   Hematuria -Previously, CT renal study from 8/10 had  shown mild hydronephrosis on the right with gradual tapering of the proximal right ureter without a visible cause of hydronephrosis.   -In this admission, patient had hematuria on the first few days of admission and it resolved.  Bladder scan did not show any obstruction. UA showed proteinuria and hematuria.  Urology recommended outpatient work-up.   -However on 8/25, he started to hematuria again.  Probably related to supratherapeutic INR.  Urobag with pink-colored minimal amount of urine today.  If continues, will get urology reevaluation on Monday.  Acute on chronic anemia -Hemoglobin low at 6.9 today.  1 unit of PRBC was ordered.  Total 3 units of PRBCs transfused this hospitalization.  Currently is not bleeding from temporary dialysis catheter site but has mild hematuria. Recent Labs    10/23/20 1200 05/13/21 1325 05/24/21 0121 05/25/21 0729 05/27/21 0343 05/28/21 0420 05/29/21 0420 05/30/21 0257 05/31/21 0342  HGB  --    < > 7.0*   < > 7.6* 7.1* 6.5* 7.2* 6.9*  MCV  --    < > 94.0   < > 95.9 96.5 99.0 98.3 98.2  VITAMINB12 464  --   --   --   --   --   --   --   --   FERRITIN  --   --  129  --   --   --   --   --   --   TIBC  --   --  189*  --   --   --   --   --   --   IRON  --   --  44*  --   --   --   --   --   --    < > =  values in this interval not displayed.   CHF Essential hypertension -Home med includes Coreg, Hydralazine and amlodipine.   -Currently blood pressure is controlled on Coreg and amlodipine.   -Continue to hold hydralazine, Entresto and spironolactone.  A. Fib -Heart rate controlled on Coreg.  Plan for Coumadin as above.  CAD s/p AICD: -Continue Coreg and statin. last device check was 04/02/21  Carotid artery stenosis -Follows with vascular surgery.   Hyperkalemia/hyperphosphatemia -Corrected after dialysis was initiated.  Moderate protein calorie malnutrition Hypoalbuminemia -Continue protein supplements.  Hypothyroidism -Continue Synthroid    GERD -Continue Protonix    Mobility: Encourage ambulation.  PT eval pending Code Status:   Code Status: Full Code  Nutritional status: Body mass index is 27.41 kg/m. Nutrition Problem: Increased nutrient needs Etiology: acute illness (AKI) Signs/Symptoms: estimated needs Diet:  Diet Order             Diet Heart Room service appropriate? Yes; Fluid consistency: Thin  Diet effective now                  DVT prophylaxis:  SCDs Start: 05/23/21 0160   Antimicrobials: None Fluid: None Consultants: Nephrology Family Communication: Wife at bedside  Status is: Inpatient  Remains inpatient appropriate because: Newly initiated on dialysis.  Pending nephrology clearance  Dispo: The patient is from: Home              Anticipated d/c is to: Likely home in next several days.  PT eval pending.              Patient currently is not medically stable to d/c.   Difficult to place patient No     Infusions:     Scheduled Meds:  sodium chloride   Intravenous Once   sodium chloride   Intravenous Once   amLODipine  5 mg Oral Daily   budesonide  0.25 mg Nebulization BID   carvedilol  3.125 mg Oral BID WC   Chlorhexidine Gluconate Cloth  6 each Topical Daily   feeding supplement (NEPRO CARB STEADY)  237 mL Oral TID BM   ferrous sulfate  325 mg Oral Q breakfast   insulin aspart  0-5 Units Subcutaneous QHS   insulin aspart  0-6 Units Subcutaneous TID WC   ipratropium-albuterol  3 mL Nebulization BID   levothyroxine  112 mcg Oral Q0600   multivitamin with minerals  1 tablet Oral Daily   pantoprazole  40 mg Oral BID   pravastatin  40 mg Oral q1800   Warfarin - Pharmacist Dosing Inpatient   Does not apply q1600    Antimicrobials: Anti-infectives (From admission, onward)    None       PRN meds: acetaminophen, albuterol, dextromethorphan-guaiFENesin, lidocaine (PF), senna-docusate, sodium chloride flush, traZODone   Objective: Vitals:   05/31/21 0754 05/31/21 0845  BP:   (!) 146/50  Pulse:  (!) 59  Resp:  16  Temp:  98.4 F (36.9 C)  SpO2: 98% 99%    Intake/Output Summary (Last 24 hours) at 05/31/2021 1056 Last data filed at 05/31/2021 0900 Gross per 24 hour  Intake 640 ml  Output 2125 ml  Net -1485 ml    Filed Weights   05/29/21 2013 05/30/21 0805 05/30/21 2112  Weight: 83.6 kg 84.2 kg 84.2 kg   Weight change: 0.6 kg Body mass index is 27.41 kg/m.   Physical Exam: General exam: Pleasant, elderly Caucasian male.  Not in distress.  On 2 L oxygen. Skin: No rashes, lesions or ulcers. HEENT:  Atraumatic, normocephalic, no obvious bleeding Lungs: Diminished bilateral lower lobe air entry, otherwise clear to auscultation bilaterally. CVS: Regular rate and rhythm, no murmur GI/Abd soft, nontender, nondistended, bowel sound present CNS: Alert, awake, slow to respond, oriented to place and person Psychiatry: Depressed look Extremities: No pedal edema, no calf tenderness  Data Review: I have personally reviewed the laboratory data and studies available.  Recent Labs  Lab 05/27/21 0343 05/28/21 0420 05/29/21 0420 05/30/21 0257 05/31/21 0342  WBC 11.2* 12.5* 12.9* 13.7* 10.0  NEUTROABS  --   --  10.6* 11.1* 7.7  HGB 7.6* 7.1* 6.5* 7.2* 6.9*  HCT 23.4* 21.8* 20.0* 22.7* 21.9*  MCV 95.9 96.5 99.0 98.3 98.2  PLT 237 240 193 201 217    Recent Labs  Lab 05/25/21 0729 05/26/21 0058 05/27/21 0343 05/28/21 0420 05/29/21 0420 05/30/21 0257 05/31/21 0342  NA 137 137 138 138 138 135 135  K 5.1 5.0 4.5 4.5 4.0 4.0 3.5  CL 108 109 107 105 102 100 99  CO2 19* 18* 19* 19* 23 25 28   GLUCOSE 94 96 82 90 96 109* 107*  BUN 86* 87* 93* 99* 72* 82* 45*  CREATININE 9.12* 9.09* 9.48* 9.36* 7.89* 8.61* 5.78*  CALCIUM 8.1* 8.3* 8.2* 8.3* 8.0* 8.0* 7.9*  MG 2.1 1.9 2.0 2.1  --   --   --      F/u labs ordered Unresulted Labs (From admission, onward)     Start     Ordered   05/25/21 0500  Protime-INR  Daily,   R      05/24/21 0816             Signed, Terrilee Croak, MD [facility] 05/31/2021

## 2021-05-31 NOTE — Progress Notes (Signed)
Inpatient Rehab Admissions Coordinator Note:   Per PT patient was screened for CIR candidacy by Cassiel Fernandez Danford Bad, CCC-SLP. At this time, pt appears to be a potential candidate for CIR. I will place an order for rehab consult for full assessment, per our protocol.  Please contact me any with questions.[name], [place], [place] Admissions Coordinator [phone] 05/31/21 4:47 PM

## 2021-05-31 NOTE — Evaluation (Signed)
Physical Therapy Evaluation Patient Details Name: [name] [name] MRN: [id number] DOB: [birth year] Today's Date: 05/31/2021   History of Present Illness  Pt is a 80 y.o. yo male who presented to the ER for abnormal lab results (sent by his urologist) and for the evaluation of AKI on CKD and hyperkalemia; have started HD; with medical history of hypertension, COPD, CAD status post CABG, A. fib on anticoagulation, CHF, CKD 4  Clinical Impression  Pt admitted with above diagnosis. Comes from home where he lives with his wife in a single story home with 3 steps to enter; Independent prior to admission, enjoys golf; Presents to PT with generalized weakness, decr functional mobility, decr activity tolerance; Currently requires assist to stand and pivot OOB to chair; He will have assist at home at dc, shows a significant functional decline, has medical complexity being new to HD; Recommend CIR for post-acute rehab to maximize independence and safety with mobility;  Pt currently with functional limitations due to the deficits listed below (see PT Problem List). Pt will benefit from skilled PT to increase their independence and safety with mobility to allow discharge to the venue listed below.       Follow Up Recommendations CIR    Equipment Recommendations  Rolling walker with 5" wheels;3in1 (PT);Other (comment) (will also depend on progress)    Recommendations for Other Services OT consult (ordered per protocol)     Precautions / Restrictions Precautions Precautions: Fall      Mobility  Bed Mobility Overal bed mobility: Needs Assistance Bed Mobility: Supine to Sit     Supine to sit: Min guard     General bed mobility comments: No physical assist needed; cues to scoot to EOB, get feet to floor, and to self-monitor for activity tolerance    Transfers Overall transfer level: Needs assistance Equipment used: 2 person hand held assist Transfers: Sit to/from [facility] Sit  to Stand: Mod assist;+2 safety/equipment Stand pivot transfers: Mod assist;+2 safety/equipment       General transfer comment: Mod assist to power up, bilateral support at shoulder girdle; initially with trunk flexed posture, light mod assist to get to full trunk extension in standing; pivot to chair with 2 person assist and small steps; stood from chair again for assist with hygeine  Ambulation/Gait                Stairs            Wheelchair Mobility    Modified Rankin (Stroke Patients Only)       Balance Overall balance assessment: Needs assistance Sitting-balance support: Feet supported;Bilateral upper extremity supported Sitting balance-Leahy Scale: Fair     Standing balance support: Bilateral upper extremity supported Standing balance-Leahy Scale: Poor                               Pertinent Vitals/Pain Pain Assessment: Faces Faces Pain Scale: Hurts a little bit Pain Location: Grimace with movement Pain Descriptors / Indicators: Grimacing Pain Intervention(s): Repositioned    Home Living Family/patient expects to be discharged to:: Private residence Living Arrangements: Spouse/significant other Available Help at Discharge: Family;Available 24 hours/day Type of Home: House Home Access: Stairs to enter Entrance Stairs-Rails: Left Entrance Stairs-Number of Steps: 3 Home Layout: One level Home Equipment: Other (comment) Additional Comments: Supplemental O2 at night    Prior Function Level of Independence: Independent         Comments: Enjoys golf, visiting  grandkids     Hand Dominance        Extremity/Trunk Assessment   Upper Extremity Assessment Upper Extremity Assessment: Generalized weakness    Lower Extremity Assessment Lower Extremity Assessment: Generalized weakness    Cervical / Trunk Assessment Cervical / Trunk Assessment: Normal  Communication   Communication: No difficulties  Cognition Arousal/Alertness:  Awake/alert Behavior During Therapy: WFL for tasks assessed/performed Overall Cognitive Status: Within Functional Limits for tasks assessed                                        General Comments General comments (skin integrity, edema, etc.): O2 sats decr to 92% on 2 L supplemental O2; Lengthy discussion re: what to expect, and what functional mobility goals to work towards to be able to go home and participate in Outpt HD    Exercises     Assessment/Plan    PT Assessment Patient needs continued PT services  PT Problem List Decreased strength;Decreased activity tolerance;Decreased balance;Decreased mobility;Decreased knowledge of use of DME;Decreased safety awareness       PT Treatment Interventions DME instruction;Gait training;Stair training;Functional mobility training;Therapeutic activities;Therapeutic exercise;Balance training;Patient/family education    PT Goals (Current goals can be found in the Care Plan section)  Acute Rehab PT Goals Patient Stated Goal: get stronger; be able to walk PT Goal Formulation: With patient/family Time For Goal Achievement: 06/14/21 Potential to Achieve Goals: Good    Frequency Min 3X/week   Barriers to discharge Other (comment) Must be able to tolerate sitting up for 4-6 hours, and be able to perform simple mobility/amb post HD to be able to dc home with Outpt HD    Co-evaluation               AM-PAC PT "6 Clicks" Mobility  Outcome Measure Help needed turning from your back to your side while in a flat bed without using bedrails?: None Help needed moving from lying on your back to sitting on the side of a flat bed without using bedrails?: A Little Help needed moving to and from a bed to a chair (including a wheelchair)?: A Lot Help needed standing up from a chair using your arms (e.g., wheelchair or bedside chair)?: A Lot Help needed to walk in hospital room?: Total Help needed climbing 3-5 steps with a railing? :  Total 6 Click Score: 13    End of Session Equipment Utilized During Treatment: Gait belt;Oxygen Activity Tolerance: Patient tolerated treatment well Patient left: in chair;with call bell/phone within reach;with chair alarm set Nurse Communication: Mobility status PT Visit Diagnosis: Unsteadiness on feet (R26.81);Muscle weakness (generalized) (M62.81)    Time: 1236-1310 PT Time Calculation (min) (ACUTE ONLY): 34 min   Charges:   PT Evaluation $PT Eval Moderate Complexity: 1 Mod PT Treatments $Therapeutic Activity: 8-22 mins        [name], PT  Acute Rehabilitation Services Pager [phone] Office [phone]   Colletta [place] 05/31/2021, 1:48 PM

## 2021-05-31 NOTE — Progress Notes (Signed)
ANTICOAGULATION CONSULT NOTE - Follow Up Consult  Pharmacy Consult for Warfarin Indication: atrial fibrillation  No Known Allergies  Patient Measurements: Height: 5\' 9"  (175.3 cm) Weight: 84.2 kg (185 lb 10 oz) IBW/kg (Calculated) : 70.7  Vital Signs: Temp: 98.4 F (36.9 C) (08/28 0845) Temp Source: Oral (08/28 0845) BP: 146/50 (08/28 0845) Pulse Rate: 59 (08/28 0845)  Labs: Recent Labs    05/29/21 0420 05/30/21 0257 05/31/21 0342  HGB 6.5* 7.2* 6.9*  HCT 20.0* 22.7* 21.9*  PLT 193 201 217  LABPROT 25.7* 21.8* 21.1*  INR 2.4* 1.9* 1.8*  CREATININE 7.89* 8.61* 5.78*     Estimated Creatinine Clearance: 10 mL/min (A) (by C-G formula based on SCr of 5.78 mg/dL (H)).   Medical History: Past Medical History:  Diagnosis Date   AICD (automatic cardioverter/defibrillator) present 04/05/2017   Anemia    Arthritis    "hips; back" (07/09/2014)   Asthma    Atrial fibrillation (HCC)    CAD (coronary artery disease)    CHF (congestive heart failure) ([place])    Cholecystitis 11/2013   CKD (chronic kidney disease) 2015   Stage  3.    COPD (chronic obstructive pulmonary disease) (HCC)    on home oxygen, 2 liters at night10/2015   Esophageal reflux    Gallstones    Gastric antral vascular ectasia 2013   Gout    Heme positive stool    Hepatomegaly    Hiatal hernia    History of blood transfusion ~ 2012   "blood count dropped; had to get 3 units"   Hyperlipidemia    Hypothyroidism    Other and unspecified coagulation defects    Personal history of colonic polyps 05/29/2010   TUBULAR ADENOMA   Unspecified essential hypertension     Medications:  Medications Prior to Admission  Medication Sig Dispense Refill Last Dose   acetaminophen (TYLENOL) 500 MG tablet Take 500 mg by mouth every 6 (six) hours as needed for headache or mild pain.   05/22/2021   albuterol (VENTOLIN HFA) 108 (90 Base) MCG/ACT inhaler USE 2 INHALATIONS BY MOUTH  EVERY 6 HOURS AS NEEDED FOR SHORTNESS OF  BREATH (Patient taking differently: Inhale 2 puffs into the lungs every 6 (six) hours as needed for shortness of breath.) 34 g 0 05/23/2021   budesonide (PULMICORT) 0.25 MG/2ML nebulizer solution USE 1 VIAL  IN  NEBULIZER TWICE  DAILY - rinse mouth after treatment (Patient taking differently: Take 0.25 mg by nebulization See admin instructions. USE 1 VIAL  IN  NEBULIZER TWICE DAILY - rinse mouth after treatment) 6 mL 5 05/23/2021   carvedilol (COREG) 6.25 MG tablet Take 1.5 tablets (9.375 mg total) by mouth 2 (two) times daily. 270 tablet 0 05/22/2021 at 2000   Cholecalciferol (VITAMIN D3) 5000 UNITS CAPS Take 5,000 Units by mouth daily.   05/22/2021   docusate sodium (COLACE) 100 MG capsule Take 100 mg by mouth daily as needed for mild constipation.   Past Month   empagliflozin (JARDIANCE) 10 MG TABS tablet Take 1 tablet (10 mg total) by mouth daily before breakfast. 30 tablet 11 05/22/2021   ferrous sulfate 325 (65 FE) MG tablet Take 325 mg by mouth daily with breakfast.   05/22/2021   hydrALAZINE (APRESOLINE) 50 MG tablet TAKE 1 TABLET BY MOUTH 3  TIMES DAILY (Patient taking differently: Take 50 mg by mouth 3 (three) times daily.) 270 tablet 3 05/22/2021   HYDROcodone-acetaminophen (NORCO) 5-325 MG tablet Take 2 tablets by mouth every 4 (four)  hours as needed. (Patient taking differently: Take 1-2 tablets by mouth every 4 (four) hours as needed for moderate pain.) 10 tablet 0 05/22/2021   Hypromell-Glycerin-Naphazoline 0.8-0.25-0.012 % SOLN Place 1-2 drops into both eyes 3 (three) times daily as needed (for dry eyes.).   Past Month   ipratropium-albuterol (DUONEB) 0.5-2.5 (3) MG/3ML SOLN USE 1 VIAL IN NEBULIZER 4 TIMES DAILY (Patient taking differently: Take 3 mLs by nebulization 4 (four) times daily.) 9 mL 5 05/23/2021   levothyroxine (SYNTHROID) 112 MCG tablet TAKE 1 TABLET BY MOUTH  DAILY BEFORE BREAKFAST (Patient taking differently: Take 112 mcg by mouth daily before breakfast.) 90 tablet 3 05/22/2021    lovastatin (MEVACOR) 40 MG tablet Take 2 tablets (80 mg total) by mouth at bedtime. 180 tablet 3 05/22/2021   Multiple Vitamins-Minerals (MULTIVITAMINS THER. W/MINERALS) TABS Take 1 tablet by mouth daily.   05/22/2021   nitroGLYCERIN (NITROSTAT) 0.4 MG SL tablet Place 1 tablet (0.4 mg total) under the tongue every 5 (five) minutes as needed for chest pain. 6 tablet 1 unk   OXYGEN Inhale 2 L/min into the lungs See admin instructions. 2 L/min at bedtime and as needed daily as needed for shortness of breath      pantoprazole (PROTONIX) 40 MG tablet Take 1 tablet (40 mg total) by mouth 2 (two) times daily. (Patient taking differently: Take 40 mg by mouth in the morning and at bedtime.) 180 tablet 2 05/22/2021   sacubitril-valsartan (ENTRESTO) 97-103 MG Take 1 tablet by mouth 2 (two) times daily. 180 tablet 3 05/22/2021   spironolactone (ALDACTONE) 25 MG tablet Take 0.5 tablets (12.5 mg total) by mouth daily. 30 tablet 3 05/22/2021   sucralfate (CARAFATE) 1 g tablet TAKE 1 TABLET BY MOUTH 4  TIMES DAILY 1 HOUR BEFORE  MEALS AND AT BEDTIME (Patient taking differently: Take 1 g by mouth See admin instructions. Take 1 gram by mouth four times a day- ONE HOUR before meals and at bedtime) 360 tablet 1 05/22/2021   warfarin (COUMADIN) 5 MG tablet TAKE 1 TABLET BY MOUTH  EVERY MONDAY, WEDNESDAY,  AND FRIDAY AND ONE-HALF  TABLET BY MOUTH ON THE  REMAINING DAYS OF THE WEEK (Patient taking differently: Take 2.5 mg by mouth daily.) 65 tablet 0 05/22/2021 at 0900   cephALEXin (KEFLEX) 500 MG capsule Take 1 capsule (500 mg total) by mouth 4 (four) times daily. (Patient not taking: No sig reported) 28 capsule 0 Completed Course   fluticasone furoate-vilanterol (BREO ELLIPTA) 200-25 MCG/INH AEPB Inhale 1 puff into the lungs daily. (Patient not taking: Reported on 05/23/2021) 28 each 2 Not Taking   loratadine (CLARITIN) 10 MG tablet Take 10 mg by mouth daily as needed (for allergies.).    unk   Scheduled:    Assessment: 80yo  male on warfarin PTA for Afib. Admit INR 2.7 was therapeutic on PTA regimen of warfarin 5mg  Tue and Fri, 2.5 mg all other days (recently changed 8/18).  Per chart review, patient experienced bleeding from the site of temporary dialysis catheter inserted on 8/24 and hematuria on 8/26. Patient received vitamin K 1 mg IV x1 on 8/25. Hemoglobin decreased 7.1 > 6.5, warfarin was then held 8/26 and patient received 1 unit of pRBCs.   Resumed warfarin 8/28 post-HD. Overnight, h/h dropped 6.9/21.9 s/p x1 pRBC. Per chart review, no s/s bleeding from dialysis catheter, but mild hematuria noted. INR still subtherapeutic @ 1.8. Will continue at lower warfarin 2.5mg  dose and monitor closely.   Goal of Therapy:  INR  2-3 Monitor platelets by anticoagulation protocol: Yes   Plan:  - Continue warfarin 2.5mg  once daily - F/u daily INR, CBC - Monitor for s/sx of bleeding  Thank you for allowing pharmacy to be a part of this patient's care.  Ardyth Harps, PharmD Clinical Pharmacist

## 2021-05-31 NOTE — Progress Notes (Signed)
[facility] NEPHROLOGY PROGRESS NOTE  Assessment/ Plan: Pt is a 80 y.o. yo male  with medical history of hypertension, COPD, CAD status post CABG, A. fib on anticoagulation, CHF, CKD 4 who presented to the ER for abnormal lab results (sent by his urologist) seen as a consultation for the evaluation of AKI on CKD and hyperkalemia.  #Dialysis dependent AKI with CKD4 :  Likely 2/2 ischemic ATN due to dehydration/decreased oral intake concomitant with the use of Entresto, Aldactone and Jardiance in atrophic left kidney/bilateral renal artery stenosis. Bladder scan and kidney ultrasound without obstruction, has left renal atrophy.  UA with blood and around 1.9 g of proteinuria.     Started HD 8/25, Tx #2 8/27 Received temporary HD cath with IR 8/24, will need tunneled catheter when INR is permissive; IR following Next Tx 8/30 Continue to hold Jardiance, Entresto and Aldactone.   Need to CLIP as outpt AKI tomorrow  #Hyperkalemia: Resolved, CTM  #Hypertension: Trend.  Continue carvedilol and hydralazine.     #Hematuria: Seen by urologist and concern about possible right renal pelvis mass.  Recommending outpt eval. Per Urology.     #Anemia due to blood loss/hematuria and chronic illness/CKD: Transfuse as needed. For now, no ESA because of possible pelvic mass.    #History of CHF: not overloaded.  Holding cardiac meds because of AKI.  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    # AFib: warfarin on hold until has TDC, IR following  Subjective:  Yesterday rec Tx #2;   No sig inc in UOP.   Plan discussed in detail with wife and patient at bedside this AM No inc in UOP, change in SCr/BUN consistent with dialysis dependence INR 1.9 this AM  Objective Vital signs in last 24 hours: Vitals:   05/31/21 0538 05/31/21 0600 05/31/21 0754 05/31/21 0845  BP: (!) 146/49 (!) 150/51  (!) 146/50  Pulse: 61 60  (!) 59  Resp: 19 18  16   Temp: 98.4 F (36.9 C) 98.3 F (36.8 C)  98.4 F (36.9  C)  TempSrc: Oral Oral  Oral  SpO2: 99% 98% 98% 99%  Weight:      Height:       Weight change: 0.6 kg  Intake/Output Summary (Last 24 hours) at 05/31/2021 1141 Last data filed at 05/31/2021 0900 Gross per 24 hour  Intake 640 ml  Output 125 ml  Net 515 ml        Labs: Basic Metabolic Panel: Recent Labs  Lab 05/29/21 0420 05/30/21 0257 05/31/21 0342  NA 138 135 135  K 4.0 4.0 3.5  CL 102 100 99  CO2 23 25 28   GLUCOSE 96 109* 107*  BUN 72* 82* 45*  CREATININE 7.89* 8.61* 5.78*  CALCIUM 8.0* 8.0* 7.9*    Liver Function Tests: No results for input(s): AST, ALT, ALKPHOS, BILITOT, PROT, ALBUMIN in the last 168 hours.  No results for input(s): LIPASE, AMYLASE in the last 168 hours. No results for input(s): AMMONIA in the last 168 hours. CBC: Recent Labs  Lab 05/27/21 0343 05/28/21 0420 05/29/21 0420 05/30/21 0257 05/31/21 0342  WBC 11.2* 12.5* 12.9* 13.7* 10.0  NEUTROABS  --   --  10.6* 11.1* 7.7  HGB 7.6* 7.1* 6.5* 7.2* 6.9*  HCT 23.4* 21.8* 20.0* 22.7* 21.9*  MCV 95.9 96.5 99.0 98.3 98.2  PLT 237 240 193 201 217    Cardiac Enzymes: No results for input(s): CKTOTAL, CKMB, CKMBINDEX, TROPONINI in the last 168 hours. CBG:  Recent Labs  Lab 05/30/21 0710 05/30/21 1254 05/30/21 1634 05/30/21 2113 05/31/21 0648  GLUCAP 88 87 97 97 98     Iron Studies:  No results for input(s): IRON, TIBC, TRANSFERRIN, FERRITIN in the last 72 hours.  Studies/Results: No results found.  Medications: Infusions:    Scheduled Medications:  sodium chloride   Intravenous Once   sodium chloride   Intravenous Once   amLODipine  5 mg Oral Daily   budesonide  0.25 mg Nebulization BID   carvedilol  3.125 mg Oral BID WC   Chlorhexidine Gluconate Cloth  6 each Topical Daily   feeding supplement (NEPRO CARB STEADY)  237 mL Oral TID BM   ferrous sulfate  325 mg Oral Q breakfast   insulin aspart  0-5 Units Subcutaneous QHS   insulin aspart  0-6 Units Subcutaneous TID WC    ipratropium-albuterol  3 mL Nebulization BID   levothyroxine  112 mcg Oral Q0600   multivitamin with minerals  1 tablet Oral Daily   pantoprazole  40 mg Oral BID   pravastatin  40 mg Oral q1800   warfarin  2.5 mg Oral ONCE-1600   Warfarin - Pharmacist Dosing Inpatient   Does not apply q1600    have reviewed scheduled and prn medications.  Physical Exam: General:NAD, comfortable, lying flat in bed Heart:RRR, s1s2 nl Lungs:clear b/l, no crackle Abdomen:soft, Non-tender, non-distended Extremities:No edema Neurology: Alert awake and following commands., NECK: R IJ Temp HD cath bandaged and clean, no blood  Ekam Bonebrake B Nickolis Diel 05/31/2021,11:41 AM  LOS: 8 days

## 2021-05-31 NOTE — Progress Notes (Signed)
Patient had 15 beats V-tach. MD made aware. No new orders at this time.

## 2021-06-01 DIAGNOSIS — N189 Chronic kidney disease, unspecified: Secondary | ICD-10-CM | POA: Diagnosis not present

## 2021-06-01 DIAGNOSIS — N179 Acute kidney failure, unspecified: Secondary | ICD-10-CM | POA: Diagnosis not present

## 2021-06-01 LAB — BASIC METABOLIC PANEL
Anion gap: 9 (ref 5–15)
BUN: 58 mg/dL — ABNORMAL HIGH (ref 8–23)
CO2: 26 mmol/L (ref 22–32)
Calcium: 7.8 mg/dL — ABNORMAL LOW (ref 8.9–10.3)
Chloride: 96 mmol/L — ABNORMAL LOW (ref 98–111)
Creatinine, Ser: 6.77 mg/dL — ABNORMAL HIGH (ref 0.61–1.24)
GFR, Estimated: 8 mL/min — ABNORMAL LOW (ref >60)
Glucose, Bld: 119 mg/dL — ABNORMAL HIGH (ref 70–99)
Potassium: 3.4 mmol/L — ABNORMAL LOW (ref 3.5–5.1)
Sodium: 131 mmol/L — ABNORMAL LOW (ref 135–145)

## 2021-06-01 LAB — TYPE AND SCREEN
ABO/RH(D): A POS
Antibody Screen: NEGATIVE
Unit division: 0
Unit division: 0

## 2021-06-01 LAB — BPAM RBC
Blood Product Expiration Date: 202209142359
Blood Product Expiration Date: 202209212359
ISSUE DATE / TIME: 202208261031
ISSUE DATE / TIME: 202208280522
Unit Type and Rh: 6200
Unit Type and Rh: 6200

## 2021-06-01 LAB — CBC WITH DIFFERENTIAL/PLATELET
Abs Immature Granulocytes: 0.1 10*3/uL — ABNORMAL HIGH (ref 0.00–0.07)
Basophils Absolute: 0.1 10*3/uL (ref 0.0–0.1)
Basophils Relative: 0 %
Eosinophils Absolute: 0.4 10*3/uL (ref 0.0–0.5)
Eosinophils Relative: 3 %
HCT: 26.4 % — ABNORMAL LOW (ref 39.0–52.0)
Hemoglobin: 8.7 g/dL — ABNORMAL LOW (ref 13.0–17.0)
Immature Granulocytes: 1 %
Lymphocytes Relative: 5 %
Lymphs Abs: 0.5 10*3/uL — ABNORMAL LOW (ref 0.7–4.0)
MCH: 31.4 pg (ref 26.0–34.0)
MCHC: 33 g/dL (ref 30.0–36.0)
MCV: 95.3 fL (ref 80.0–100.0)
Monocytes Absolute: 1.2 10*3/uL — ABNORMAL HIGH (ref 0.1–1.0)
Monocytes Relative: 10 %
Neutro Abs: 9.5 10*3/uL — ABNORMAL HIGH (ref 1.7–7.7)
Neutrophils Relative %: 81 %
Platelets: 247 10*3/uL (ref 150–400)
RBC: 2.77 MIL/uL — ABNORMAL LOW (ref 4.22–5.81)
RDW: 14.7 % (ref 11.5–15.5)
WBC: 11.7 10*3/uL — ABNORMAL HIGH (ref 4.0–10.5)
nRBC: 0 % (ref 0.0–0.2)

## 2021-06-01 LAB — GLUCOSE, CAPILLARY
Glucose-Capillary: 94 mg/dL (ref 70–99)
Glucose-Capillary: 93 mg/dL (ref 70–99)
Glucose-Capillary: 121 mg/dL — ABNORMAL HIGH (ref 70–99)
Glucose-Capillary: 92 mg/dL (ref 70–99)

## 2021-06-01 LAB — PROTIME-INR
INR: 1.7 — ABNORMAL HIGH (ref 0.8–1.2)
Prothrombin Time: 19.9 seconds — ABNORMAL HIGH (ref 11.4–15.2)

## 2021-06-01 MED ORDER — CEFAZOLIN SODIUM-DEXTROSE 2-4 GM/100ML-% IV SOLN
2.0000 g | INTRAVENOUS | Status: AC
  Administered 2021-06-02: 2 g via INTRAVENOUS
  Filled 2021-06-01: qty 100

## 2021-06-01 NOTE — Consult Note (Signed)
Chief Complaint: Patient was seen in consultation today for non tunneled dialysis catheter conversion to tunneled dialysis catheter at the request of Dr Servando Salina   Supervising Physician: Markus Daft  Patient Status: [facility] - In-pt  History of Present Illness: [name] [name] is a 80 y.o. male   AKI/CKD stage IV, oliguric - presumably due to ischemic ATN due to volume depletion and concomitant use of ARB, aldactone, and jardiance.  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  Started on HD 05/28/21 with temp HD catheter.   IR placed temp cath 05/27/21 No evidence of renal recovery at this time and will try to arrange for outpatient HD as AKI patient.  Will consult IR for [facility] placement.  Plan for HD tomorrow.  Scheduled for non tunneled conversion to tunneled catheter in IR 06/02/21 LD Coumadin 05/31/21 INR 1.7  8/29  Past Medical History:  Diagnosis Date   AICD (automatic cardioverter/defibrillator) present 04/05/2017   Anemia    Arthritis    "hips; back" (07/09/2014)   Asthma    Atrial fibrillation (HCC)    CAD (coronary artery disease)    CHF (congestive heart failure) ([place])    Cholecystitis 11/2013   CKD (chronic kidney disease) 2015   Stage  3.    COPD (chronic obstructive pulmonary disease) (HCC)    on home oxygen, 2 liters at night10/2015   Esophageal reflux    Gallstones    Gastric antral vascular ectasia 2013   Gout    Heme positive stool    Hepatomegaly    Hiatal hernia    History of blood transfusion ~ 2012   "blood count dropped; had to get 3 units"   Hyperlipidemia    Hypothyroidism    Other and unspecified coagulation defects    Personal history of colonic polyps 05/29/2010   TUBULAR ADENOMA   Unspecified essential hypertension     Past Surgical History:  Procedure Laterality Date   CARDIAC CATHETERIZATION N/A 07/15/2016   Procedure: Right/Left Heart Cath and Coronary/Graft Angiography;  Surgeon: Belva Crome, MD;  Location: [facility];  Service: Cardiovascular;  Laterality: N/A;   cholecystomy tube  12/28/2014 - 01/06/2015   tube clogged with debris, removed and IR unable to place new tube.    COLONOSCOPY  2013   Dr. Sharlett Iles: no polyps or evidence of active bleeding   CORONARY ANGIOPLASTY WITH STENT PLACEMENT  ~ 2008; 07/08/2014   "1 + 3"   CORONARY ARTERY BYPASS GRAFT  1998   CABG X4   ESOPHAGOGASTRODUODENOSCOPY  2013   Dr. 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 Sharlett Iles: AVM at 32 and blood at 30 minutes beyond first duodenal image but not actual lesion seen. If persistent IDA, bleeding, recommend enteroscopy with ablation    HERNIA REPAIR     ICD IMPLANT N/A 04/05/2017   Procedure: ICD Implant;  Surgeon: Evans Lance, MD;  Location: [facility];  Service: Cardiovascular;  Laterality: N/A;   IR FLUORO GUIDE CV LINE RIGHT  05/27/2021   IR US GUIDE VASC ACCESS RIGHT  05/27/2021   LEFT AND RIGHT HEART CATHETERIZATION WITH CORONARY/GRAFT ANGIOGRAM N/A 07/09/2014   Right and left heart cath, bare metal stent to SVG  to RCA. Sinclair Grooms, MD;    UMBILICAL HERNIA REPAIR  [phone]    Allergies: Patient has no known allergies.  Medications: Prior to Admission medications   Medication Sig Start Date End Date Taking? Authorizing Provider  acetaminophen (TYLENOL) 500 MG tablet Take 500 mg by mouth every 6 (six) hours as needed for headache or mild pain.   Yes [provider]  albuterol (VENTOLIN HFA) 108 (90 Base) MCG/ACT inhaler USE 2 INHALATIONS BY MOUTH  EVERY 6 HOURS AS NEEDED FOR SHORTNESS OF BREATH Patient taking differently: Inhale 2 puffs into the lungs every 6 (six) hours as needed for shortness of  breath. 04/17/21  Yes Stacks, [name], MD  budesonide (PULMICORT) 0.25 MG/2ML nebulizer solution USE 1 VIAL  IN  NEBULIZER TWICE  DAILY - rinse mouth after treatment Patient taking differently: Take 0.25 mg by nebulization See admin instructions. USE 1 VIAL  IN  NEBULIZER TWICE DAILY - rinse mouth after treatment 03/03/21  Yes Stacks, [name], MD  carvedilol (COREG) 6.25 MG tablet Take 1.5 tablets (9.375 mg total) by mouth 2 (two) times daily. 02/16/21  Yes Stacks, [name], MD  Cholecalciferol (VITAMIN D3) 5000 UNITS CAPS Take 5,000 Units by mouth daily.   Yes [provider]  docusate sodium (COLACE) 100 MG capsule Take 100 mg by mouth daily as needed for mild constipation.   Yes [provider]  empagliflozin (JARDIANCE) 10 MG TABS tablet Take 1 tablet (10 mg total) by mouth daily before breakfast. 11/24/20  Yes Claretta Fraise, MD  ferrous sulfate 325 (65 FE) MG tablet Take 325 mg by mouth daily with breakfast.   Yes [provider]  hydrALAZINE (APRESOLINE) 50 MG tablet TAKE 1 TABLET BY MOUTH 3  TIMES DAILY Patient taking differently: Take 50 mg by mouth 3 (three) times daily. 07/21/20  Yes Larey Dresser, MD  HYDROcodone-acetaminophen (NORCO) 5-325 MG tablet Take 2 tablets by mouth every 4 (four) hours as needed. Patient taking differently: Take 1-2 tablets by mouth every 4 (four) hours as needed for moderate pain. 05/13/21  Yes Daleen Bo, MD  Hypromell-Glycerin-Naphazoline 0.8-0.25-0.012 % SOLN Place 1-2 drops into both eyes 3 (three) times daily as needed (for dry eyes.).   Yes [provider]  ipratropium-albuterol (DUONEB) 0.5-2.5 (3) MG/3ML SOLN USE 1 VIAL IN NEBULIZER 4 TIMES DAILY Patient taking differently: Take 3 mLs by nebulization 4 (four) times daily. 03/03/21  Yes Stacks, [name], MD  levothyroxine (SYNTHROID) 112 MCG tablet TAKE 1 TABLET BY MOUTH  DAILY BEFORE BREAKFAST Patient taking differently: Take 112 mcg by mouth daily before breakfast. 10/17/20   Yes Stacks, [name], MD  lovastatin (MEVACOR) 40 MG tablet Take 2 tablets (80 mg total) by mouth at bedtime. 09/09/20  Yes Claretta Fraise, MD  Multiple Vitamins-Minerals (MULTIVITAMINS THER. W/MINERALS) TABS Take 1 tablet by mouth daily.   Yes [provider]  nitroGLYCERIN (NITROSTAT) 0.4 MG SL tablet Place 1 tablet (0.4 mg total) under the tongue every 5 (five) minutes as needed for chest pain. 03/18/17  Yes Shirley Friar, PA-C  OXYGEN Inhale 2 L/min into the lungs See admin instructions. 2 L/min at bedtime and as needed daily as needed for shortness of breath   Yes [provider]  pantoprazole (PROTONIX) 40 MG tablet Take 1 tablet (40 mg total) by mouth 2 (two) times daily. Patient taking differently: Take 40 mg by mouth in the morning and at bedtime. 12/12/20  Yes Claretta Fraise, MD  sacubitril-valsartan (ENTRESTO) 97-103 MG Take 1  tablet by mouth 2 (two) times daily. 10/23/20  Yes Laurey Morale, MD  spironolactone (ALDACTONE) 25 MG tablet Take 0.5 tablets (12.5 mg total) by mouth daily. 12/16/20  Yes Laurey Morale, MD  sucralfate (CARAFATE) 1 g tablet TAKE 1 TABLET BY MOUTH 4  TIMES DAILY 1 HOUR BEFORE  MEALS AND AT BEDTIME Patient taking differently: Take 1 g by mouth See admin instructions. Take 1 gram by mouth four times a day- ONE HOUR before meals and at bedtime 10/13/20  Yes Stacks, Broadus John, MD  warfarin (COUMADIN) 5 MG tablet TAKE 1 TABLET BY MOUTH  EVERY MONDAY, WEDNESDAY,  AND FRIDAY AND ONE-HALF  TABLET BY MOUTH ON THE  REMAINING DAYS OF THE WEEK Patient taking differently: Take 2.5 mg by mouth daily. 04/17/21  Yes Stacks, Broadus John, MD  cephALEXin (KEFLEX) 500 MG capsule Take 1 capsule (500 mg total) by mouth 4 (four) times daily. Patient not taking: No sig reported 05/13/21   Mancel Bale, MD  fluticasone furoate-vilanterol (BREO ELLIPTA) 200-25 MCG/INH AEPB Inhale 1 puff into the lungs daily. Patient not taking: Reported on 05/23/2021 10/13/20   Mechele Claude, MD  loratadine (CLARITIN) 10 MG tablet Take 10 mg by mouth daily as needed (for allergies.).     [provider]     Family History  Problem Relation Age of Onset   Leukemia Mother    Kidney disease Mother        kidney removed    Heart attack Brother    Heart disease Brother    Heart disease Father    Stomach cancer Sister    Stomach cancer Sister    Early death Brother    Hypertension Other        family    Colon cancer Neg Hx     Social History   Socioeconomic History   Marital status: Married    Spouse name: Talbert Forest   Number of children: 2   Years of education: 12   Highest education level: High school graduate  Occupational History   Occupation: retired    Comment: [employer]  Tobacco Use   Smoking status: Former    Packs/day: 1.00    Years: 42.00    Pack years: 42.00    Types: Cigarettes    Quit date: 12/16/1996    Years since quitting: 24.4   Smokeless tobacco: Never  Vaping Use   Vaping Use: Never used  Substance and Sexual Activity   Alcohol use: Yes    Alcohol/week: 1.0 standard drink    Types: 1 Cans of beer per week    Comment: 1 per month   Drug use: No   Sexual activity: Not Currently    Birth control/protection: None  Other Topics Concern   Not on file  Social History Narrative   Not on file   Social Determinants of Health   Financial Resource Strain: Not on file  Food Insecurity: Not on file  Transportation Needs: Not on file  Physical Activity: Not on file  Stress: Not on file  Social Connections: Not on file    Review of Systems: A 12 point ROS discussed and pertinent positives are indicated in the HPI above.  All other systems are negative.    Vital Signs: BP (!) 143/44   Pulse 60   Temp 97.7 F (36.5 C) (Oral)   Resp 16   Ht 5\' 9"  (1.753 m)   Wt 185 lb 10 oz (84.2 kg)   SpO2 99%  BMI 27.41 kg/m   Physical Exam Vitals reviewed.  HENT:     Mouth/Throat:     Mouth: Mucous membranes are  moist.  Cardiovascular:     Rate and Rhythm: Normal rate.     Heart sounds: Normal heart sounds.  Pulmonary:     Breath sounds: Normal breath sounds.  Abdominal:     Palpations: Abdomen is soft.  Musculoskeletal:        General: Normal range of motion.  Skin:    General: Skin is warm.  Neurological:     Mental Status: He is alert and oriented to person, place, and time.  Psychiatric:        Behavior: Behavior normal.    Imaging: US RENAL  Result Date: 05/23/2021 CLINICAL DATA:  "Bladder neck obstruction". EXAM: RENAL / URINARY TRACT ULTRASOUND COMPLETE COMPARISON:  05/13/2021 CT FINDINGS: Right Kidney: Renal measurements: 11.0 by 6.6 x 5.6 cm = volume: 211 mL. No hydronephrosis. A lower pole 1.6 cm markedly hypoechoic to anechoic lesion. Left Kidney: Renal measurements: 8.0 x 3.1 x 2.9 cm = volume: 38.1 mL. Moderately atrophic. No hydronephrosis. Bladder: Appears normal for degree of bladder distention. Other: None. IMPRESSION: 1. Left renal atrophy. 2. Lower pole right renal cyst or minimally complex cyst. Electronically Signed   By: Abigail Miyamoto M.D.   On: 05/23/2021 08:32   IR Fluoro Guide CV Line Right  Result Date: 05/27/2021 INDICATION: 80 year old male with acute kidney injury on chronic kidney disease requiring central venous access for hemodialysis. EXAM: NON-TUNNELED CENTRAL VENOUS HEMODIALYSIS CATHETER PLACEMENT WITH ULTRASOUND AND FLUOROSCOPIC GUIDANCE COMPARISON:  None. MEDICATIONS: None FLUOROSCOPY TIME:  0 minutes, 6 seconds (2 mGy) COMPLICATIONS: None immediate. PROCEDURE: Informed written consent was obtained from the patient after a discussion of the risks, benefits, and alternatives to treatment. Questions regarding the procedure were encouraged and answered. The right neck and chest were prepped with chlorhexidine in a sterile fashion, and a sterile drape was applied covering the operative field. Maximum barrier sterile technique with sterile gowns and gloves were used  for the procedure. A timeout was performed prior to the initiation of the procedure. After the overlying soft tissues were anesthetized, a small venotomy incision was created and a micropuncture kit was utilized to access the internal jugular vein. Real-time ultrasound guidance was utilized for vascular access including the acquisition of a permanent ultrasound image documenting patency of the accessed vessel. The microwire was utilized to measure appropriate catheter length. A stiff glidewire was advanced to the level of the IVC. Under fluoroscopic guidance, the venotomy was serially dilated, ultimately allowing placement of a 12 French 24 cm temporary Trialysis catheter with tip ultimately terminating within the superior aspect of the right atrium. Final catheter positioning was confirmed and documented with a spot radiographic image. The catheter aspirates and flushes normally. The catheter was flushed with appropriate volume heparin dwells. The catheter exit site was secured with a 0-Prolene retention suture. A dressing was placed. The patient tolerated the procedure well without immediate post procedural complication. IMPRESSION: Successful placement of a right internal jugular approach 24 cm temporary dialysis catheter with tip terminating with in the superior aspect of the right atrium. The catheter is ready for immediate use. PLAN: This catheter may be converted to a tunneled dialysis catheter at a later date as indicated. Ruthann Cancer, MD [facility] Radiology Electronically Signed   By: Ruthann Cancer M.D.   On: 05/27/2021 11:04   IR US Guide Vasc Access Right  Result Date: 05/27/2021 INDICATION: 80 year old male with acute kidney injury on chronic kidney disease requiring central venous access for hemodialysis. EXAM: NON-TUNNELED CENTRAL VENOUS HEMODIALYSIS CATHETER PLACEMENT WITH ULTRASOUND AND FLUOROSCOPIC GUIDANCE COMPARISON:  None. MEDICATIONS: None  FLUOROSCOPY TIME:  0 minutes, 6 seconds (2 mGy) COMPLICATIONS: None immediate. PROCEDURE: Informed written consent was obtained from the patient after a discussion of the risks, benefits, and alternatives to treatment. Questions regarding the procedure were encouraged and answered. The right neck and chest were prepped with chlorhexidine in a sterile fashion, and a sterile drape was applied covering the operative field. Maximum barrier sterile technique with sterile gowns and gloves were used for the procedure. A timeout was performed prior to the initiation of the procedure. After the overlying soft tissues were anesthetized, a small venotomy incision was created and a micropuncture kit was utilized to access the internal jugular vein. Real-time ultrasound guidance was utilized for vascular access including the acquisition of a permanent ultrasound image documenting patency of the accessed vessel. The microwire was utilized to measure appropriate catheter length. A stiff glidewire was advanced to the level of the IVC. Under fluoroscopic guidance, the venotomy was serially dilated, ultimately allowing placement of a 12 French 24 cm temporary Trialysis catheter with tip ultimately terminating within the superior aspect of the right atrium. Final catheter positioning was confirmed and documented with a spot radiographic image. The catheter aspirates and flushes normally. The catheter was flushed with appropriate volume heparin dwells. The catheter exit site was secured with a 0-Prolene retention suture. A dressing was placed. The patient tolerated the procedure well without immediate post procedural complication. IMPRESSION: Successful placement of a right internal jugular approach 24 cm temporary dialysis catheter with tip terminating with in the superior aspect of the right atrium. The catheter is ready for immediate use. PLAN: This catheter may be converted to a tunneled dialysis catheter at a later date as  indicated. Ruthann Cancer, MD [facility] Radiology Electronically Signed   By: Ruthann Cancer M.D.   On: 05/27/2021 11:04   CT Renal Stone Study  Result Date: 05/13/2021 CLINICAL DATA:  Hematuria, back pain EXAM: CT ABDOMEN AND PELVIS WITHOUT CONTRAST TECHNIQUE: Multidetector CT imaging of the abdomen and pelvis was performed following the standard protocol without IV contrast. COMPARISON:  01/06/2015 FINDINGS: Lower chest: Small nodule along the left major fissure measures 4 mm. Right lower lobe nodule on the 1st image posteriorly measures 6 mm. Scarring in the lung bases. No effusions. Coronary artery and aortic calcifications. Mild cardiomegaly. Hepatobiliary: No focal hepatic abnormality. Gallbladder unremarkable. Pancreas: No focal abnormality or ductal dilatation. Spleen: No focal abnormality.  Normal size. Adrenals/Urinary Tract: There is mild right hydronephrosis. High-density material seen within the renal collecting system and proximal ureter, likely blood/hematuria. No visible obstructing stones. The right ureter gradually tapers to normal caliber in the proximal ureter. Bilateral renal vascular calcifications. Adrenal glands and urinary bladder unremarkable. Left kidney is atrophic. Stomach/Bowel: Sigmoid diverticulosis. No active diverticulitis. Stomach and small bowel decompressed, unremarkable. Vascular/Lymphatic: Diffuse aortoiliac atherosclerosis. No evidence of aneurysm or adenopathy. Reproductive: Mildly prominent prostate with central calcifications. Other: No free fluid or free air. Musculoskeletal: No acute bony abnormality. IMPRESSION: Mild right hydronephrosis with gradual tapering of the proximal right ureter. No visible cause of the hydronephrosis. High-density material seen within the right renal collecting system and proximal ureter compatible with hematuria/blood. Atrophic left kidney. Sigmoid diverticulosis.  No active diverticulitis.  Bilateral lower lobe pulmonary nodules. Non-contrast chest CT at  3-6 months is recommended. If the nodules are stable at time of repeat CT, then future CT at 18-24 months (from today's scan) is considered optional for low-risk patients, but is recommended for high-risk patients. This recommendation follows the consensus statement: Guidelines for Management of Incidental Pulmonary Nodules Detected on CT Images: From the Fleischner Society 2017; Radiology 2017; 284:228-243. Electronically Signed   By: Rolm Baptise M.D.   On: 05/13/2021 18:30   VAS [place] UPPER EXTREMITY VENOUS DUPLEX  Result Date: 05/27/2021 UPPER VENOUS STUDY  Patient Name:  [name] [name]  Date of Exam:   05/27/2021 Medical Rec #: [id number]        Accession #:    [id number] Date of Birth: 03/28/1941        Patient Gender: M Patient Age:   18 years Exam Location:  [facility] Procedure:      VAS [place] UPPER EXTREMITY VENOUS DUPLEX Referring Phys: Shawna Clamp --------------------------------------------------------------------------------  Indications: Swelling Comparison Study: no prior Performing Technologist: Archie Patten RVS  Examination Guidelines: A complete evaluation includes B-mode imaging, spectral Doppler, color Doppler, and power Doppler as needed of all accessible portions of each vessel. Bilateral testing is considered an integral part of a complete examination. Limited examinations for reoccurring indications may be performed as noted.  Right Findings: +----------+------------+---------+-----------+----------+-------+ RIGHT     CompressiblePhasicitySpontaneousPropertiesSummary +----------+------------+---------+-----------+----------+-------+ Subclavian               Yes       Yes                      +----------+------------+---------+-----------+----------+-------+  Left Findings: +----------+------------+---------+-----------+----------+-------+ LEFT      CompressiblePhasicitySpontaneousPropertiesSummary  +----------+------------+---------+-----------+----------+-------+ IJV           Full       Yes       Yes                      +----------+------------+---------+-----------+----------+-------+ Subclavian    Full       Yes       Yes                      +----------+------------+---------+-----------+----------+-------+ Axillary      Full       Yes       Yes                      +----------+------------+---------+-----------+----------+-------+ Brachial      Full       Yes       Yes                      +----------+------------+---------+-----------+----------+-------+ Radial        Full                                          +----------+------------+---------+-----------+----------+-------+ Ulnar         Full                                          +----------+------------+---------+-----------+----------+-------+ Cephalic      Full                                          +----------+------------+---------+-----------+----------+-------+  Basilic       Full                                          +----------+------------+---------+-----------+----------+-------+  Summary:  Right: No evidence of thrombosis in the subclavian.  Left: No evidence of deep vein thrombosis in the upper extremity. No evidence of superficial vein thrombosis in the upper extremity.  *See table(s) above for measurements and observations.  Diagnosing physician: Harold Barban MD Electronically signed by Harold Barban MD on 05/27/2021 at 8:06:09 PM.    Final     Labs:  CBC: Recent Labs    05/28/21 0420 05/29/21 0420 05/30/21 0257 05/31/21 0342  WBC 12.5* 12.9* 13.7* 10.0  HGB 7.1* 6.5* 7.2* 6.9*  HCT 21.8* 20.0* 22.7* 21.9*  PLT 240 193 201 217    COAGS: Recent Labs    05/23/21 0521 05/24/21 0859 05/29/21 0420 05/30/21 0257 05/31/21 0342 06/01/21 0357  INR 2.7*   < > 2.4* 1.9* 1.8* 1.7*  APTT 61*  --   --   --   --   --    < > = values in this interval not  displayed.    BMP: Recent Labs    09/09/20 0930 10/23/20 1200 05/28/21 0420 05/29/21 0420 05/30/21 0257 05/31/21 0342  NA 141   < > 138 138 135 135  K 4.7   < > 4.5 4.0 4.0 3.5  CL 105   < > 105 102 100 99  CO2 22   < > 19* $Rem'23 25 28  'CjaT$ GLUCOSE 93   < > 90 96 109* 107*  BUN 40*   < > 99* 72* 82* 45*  CALCIUM 9.0   < > 8.3* 8.0* 8.0* 7.9*  CREATININE 2.05*   < > 9.36* 7.89* 8.61* 5.78*  GFRNONAA 30*   < > 5* 6* 6* 9*  GFRAA 34*  --   --   --   --   --    < > = values in this interval not displayed.    LIVER FUNCTION TESTS: Recent Labs    01/06/21 1031 05/13/21 1325 05/23/21 0030 05/23/21 0521 05/24/21 1014  BILITOT 0.3 0.6 0.6 0.6  --   AST 16 16 14* 13*  --   ALT $Re'10 10 9 7  'Plr$ --   ALKPHOS 57 44 38 33*  --   PROT 6.2 6.4* 6.0* 5.3*  --   ALBUMIN 4.1 3.7 3.0* 2.8* 2.5*    TUMOR MARKERS: No results for input(s): AFPTM, CEA, CA199, CHROMGRNA in the last 8760 hours.  Assessment and Plan:  Acute on Chronic kidney disease Initiation of dialysis per Nephrology Non tunneled catheter placed in IR 05/26/32 Scheduled now for conversion of non tunneled to tunneled dialysis catheter Risks and benefits discussed with the patient including, but not limited to bleeding, infection, vascular injury, pneumothorax which may require chest tube placement, air embolism or even death  All of the patient's questions were answered, patient is agreeable to proceed. Consent signed and in chart.   Thank you for this interesting consult.  I greatly enjoyed meeting [name] [name] and look forward to participating in their care.  A copy of this report was sent to the requesting provider on this date.  Electronically Signed: Lavonia Drafts, PA-C 06/01/2021, 1:51 PM   I spent a total of 20 Minutes    in face  to face in clinical consultation, greater than 50% of which was counseling/coordinating care for tunneled dialysis catheter

## 2021-06-01 NOTE — Evaluation (Signed)
Occupational Therapy Evaluation Patient Details Name: [name] [name] MRN: [id number] DOB: January 13, 1941 Today's Date: 06/01/2021    History of Present Illness 80 y.o. yo male who presented to the ED for abnormal lab results (sent by his urologist) and for the evaluation of AKI on CKD and hyperkalemia. CT abdomen and pelvis showed mild right hydronephrosis. Pt started HD. PMH including HTN, COPD, CAD s/p CABG, A. fib on anticoagulation, CHF, CKD 4.   Clinical Impression   PTA, pt was living living with his wife and was independent; enjoys golf and spending time with his granddaughters. Pt currently requiring Min A for UB ADLS, Mod-Max A for LB ADLS, and Min A for functional transfers. Pt presenting with poor activity tolerance as seen by significant fatigue impacting his functional performance. Due to pt's motivation and PLOF, recommend dc to CIR for intensive OT to optimize safety and independence with ADLs and functional mobility. Pt will require further acute OT to facilitate safe dc.     Follow Up Recommendations  CIR    Equipment Recommendations  3 in 1 bedside commode (Defer to next venue)    Recommendations for Other Services PT consult     Precautions / Restrictions Precautions Precautions: Fall      Mobility Bed Mobility Overal bed mobility: Needs Assistance Bed Mobility: Supine to Sit;Sit to Supine     Supine to sit: Min guard Sit to supine: Min assist   General bed mobility comments: Min Guard A for safety and increased time and effort    Transfers Overall transfer level: Needs assistance Equipment used: 1 person hand held assist Transfers: Sit to/from Stand Sit to Stand: Min assist         General transfer comment: Min A for power up and gaining balance. Fatigues very quickly    Balance Overall balance assessment: Needs assistance Sitting-balance support: No upper extremity supported;Feet supported Sitting balance-Leahy Scale: Fair     Standing balance  support: During functional activity;No upper extremity supported Standing balance-Leahy Scale: Poor Standing balance comment: Reliant on physical A for balance                           ADL either performed or assessed with clinical judgement   ADL Overall ADL's : Needs assistance/impaired Eating/Feeding: Set up;Sitting   Grooming: Set up;Bed level   Upper Body Bathing: Minimal assistance;Sitting   Lower Body Bathing: Moderate assistance;Sit to/from stand   Upper Body Dressing : Minimal assistance;Sitting   Lower Body Dressing: Maximal assistance;Sit to/from stand   Toilet Transfer: Minimal assistance (simulated in room) Toilet Transfer Details (indicate cue type and reason): Min A for power up and maintaining balance         Functional mobility during ADLs: Minimal assistance (side steps towards HOB) General ADL Comments: Pt presenting with decreased actiivty tolerance and balance. Fatigues quickly. Reports he didnt sleep well due to pain. Benefits from increased encouragement     Vision         Perception     Praxis      Pertinent Vitals/Pain Pain Assessment: Faces Faces Pain Scale: Hurts even more Pain Location: Back "my kidneys" Pain Descriptors / Indicators: Grimacing Pain Intervention(s): Monitored during session;Repositioned     Hand Dominance     Extremity/Trunk Assessment Upper Extremity Assessment Upper Extremity Assessment: Generalized weakness   Lower Extremity Assessment Lower Extremity Assessment: Defer to PT evaluation   Cervical / Trunk Assessment Cervical / Trunk Assessment: Other exceptions  Cervical / Trunk Exceptions: back pain   Communication Communication Communication: No difficulties   Cognition Arousal/Alertness: Awake/alert Behavior During Therapy: WFL for tasks assessed/performed Overall Cognitive Status: Within Functional Limits for tasks assessed                                     General  Comments  BP 149/54 (89) in supine, sitting EOB 152/57 (84), supine after sit<>stand 154/54 (83). SpO2 100% on 3L and 94% on RA.    Exercises     Shoulder Instructions      Home Living Family/patient expects to be discharged to:: Private residence Living Arrangements: Spouse/significant other Available Help at Discharge: Family;Available 24 hours/day Type of Home: House Home Access: Stairs to enter [employer] of Steps: 3 Entrance Stairs-Rails: Left Home Layout: One level     Bathroom Shower/Tub: Tub/shower unit;Walk-in shower   Bathroom Toilet: Standard     Home Equipment: Environmental consultant - 2 wheels;Other (comment);Shower seat;Cane - single point;Hand held shower head   Additional Comments: Supplemental O2 at night      Prior Functioning/Environment Level of Independence: Independent        Comments: Enjoys golf, visiting grandkids, drives        OT Problem List: Decreased strength;Decreased range of motion;Decreased activity tolerance;Impaired balance (sitting and/or standing);Decreased knowledge of use of DME or AE;Decreased knowledge of precautions      OT Treatment/Interventions: Self-care/ADL training;Therapeutic exercise;Energy conservation;DME and/or AE instruction;Therapeutic activities;Patient/family education    OT Goals(Current goals can be found in the care plan section) Acute Rehab OT Goals Patient Stated Goal: get stronger; be able to walk OT Goal Formulation: With patient Time For Goal Achievement: 06/15/21 Potential to Achieve Goals: Good  OT Frequency: Min 2X/week   Barriers to D/C:            Co-evaluation              AM-PAC OT "6 Clicks" Daily Activity     Outcome Measure Help from another person eating meals?: None Help from another person taking care of personal grooming?: A Little Help from another person toileting, which includes using toliet, bedpan, or urinal?: A Little Help from another person bathing (including washing,  rinsing, drying)?: A Lot Help from another person to put on and taking off regular upper body clothing?: A Little Help from another person to put on and taking off regular lower body clothing?: A Lot 6 Click Score: 17   End of Session Equipment Utilized During Treatment: Gait belt;Oxygen (3L) Nurse Communication: Mobility status  Activity Tolerance: Patient tolerated treatment well;Patient limited by fatigue Patient left: in bed;with call bell/phone within reach  OT Visit Diagnosis: Unsteadiness on feet (R26.81);Other abnormalities of gait and mobility (R26.89);Muscle weakness (generalized) (M62.81)                Time: 0263-7858 OT Time Calculation (min): 24 min Charges:  OT General Charges $OT Visit: 1 Visit OT Evaluation $OT Eval Moderate Complexity: 1 Mod OT Treatments $Self Care/Home Management : 8-22 mins  Shaindy Reader MSOT, OTR/L Acute Rehab Pager: [phone] Office: [place] 06/01/2021, 8:33 AM

## 2021-06-01 NOTE — Progress Notes (Signed)
Physical Therapy Treatment Patient Details Name: [name] [name] MRN: [id number] DOB: Jun 26, 1941 Today's Date: 06/01/2021    History of Present Illness 80 y.o. yo male who presented to the ED for abnormal lab results (sent by his urologist) and for the evaluation of AKI on CKD and hyperkalemia. CT abdomen and pelvis showed mild right hydronephrosis. Pt started HD. PMH including HTN, COPD, CAD s/p CABG, A. fib on anticoagulation, CHF, CKD 4.    PT Comments    Pt remains limited by fatigue at this time, reporting that his breathing is his greatest deficits. Pt demonstrates improvement in transfer quality and gait, with reduced physical assistance needs. Pt will benefit from continued aggressive mobilization to improve activity tolerance and aide in a return to independence in household and community mobility. PT continues to recommend CIR placement at this time.   Follow Up Recommendations  CIR     Equipment Recommendations  Rolling walker with 5" wheels;3in1 (PT)    Recommendations for Other Services       Precautions / Restrictions Precautions Precautions: Fall Restrictions Weight Bearing Restrictions: No    Mobility  Bed Mobility Overal bed mobility: Needs Assistance Bed Mobility: Supine to Sit     Supine to sit: Supervision Sit to supine: Min assist   General bed mobility comments: Min Guard A for safety and increased time and effort    Transfers Overall transfer level: Needs assistance Equipment used: Rolling walker (2 wheeled) Transfers: Sit to/from Stand Sit to Stand: Min guard         General transfer comment: attempt to perform 5x sit to stand however pt fatigues after 2 stands in 17 seconds  Ambulation/Gait Ambulation/Gait assistance: Min guard Gait Distance (Feet): 30 Feet (30' x 2 trials) Assistive device: Rolling walker (2 wheeled) Gait Pattern/deviations: Step-through pattern Gait velocity: reduced Gait velocity interpretation: <1.8 ft/sec,  indicate of risk for recurrent falls General Gait Details: pt with slowed step-through gait, increased trunk flexion   Stairs             Wheelchair Mobility    Modified Rankin (Stroke Patients Only)       Balance Overall balance assessment: Needs assistance Sitting-balance support: No upper extremity supported;Feet supported Sitting balance-Leahy Scale: Fair     Standing balance support: Bilateral upper extremity supported Standing balance-Leahy Scale: Poor Standing balance comment: reliant on BUE support and minG                            Cognition Arousal/Alertness: Awake/alert Behavior During Therapy: WFL for tasks assessed/performed Overall Cognitive Status: Within Functional Limits for tasks assessed                                        Exercises      General Comments General comments (skin integrity, edema, etc.): pt on 3L [place] upon arrival, desats to 87% with activity when on room air. Pt sats at 97% when on 2L at end of session      Pertinent Vitals/Pain Pain Assessment: Faces Faces Pain Scale: No hurt Pain Location: Back "my kidneys" Pain Descriptors / Indicators: Grimacing Pain Intervention(s): Monitored during session;Repositioned    Home Living Family/patient expects to be discharged to:: Private residence Living Arrangements: Spouse/significant other Available Help at Discharge: Family;Available 24 hours/day Type of Home: House Home Access: Stairs to enter Entrance Stairs-Rails: Left Home  Layout: One level Home Equipment: Walker - 2 wheels;Other (comment);Shower seat;Cane - single point;Hand held shower head Additional Comments: Supplemental O2 at night    Prior Function Level of Independence: Independent      Comments: Enjoys golf, visiting grandkids, drives   PT Goals (current goals can now be found in the care plan section) Acute Rehab PT Goals Patient Stated Goal: get stronger; be able to walk Progress  towards PT goals: Progressing toward goals    Frequency    Min 3X/week      PT Plan Current plan remains appropriate    Co-evaluation              AM-PAC PT "6 Clicks" Mobility   Outcome Measure  Help needed turning from your back to your side while in a flat bed without using bedrails?: None Help needed moving from lying on your back to sitting on the side of a flat bed without using bedrails?: A Little Help needed moving to and from a bed to a chair (including a wheelchair)?: A Little Help needed standing up from a chair using your arms (e.g., wheelchair or bedside chair)?: A Little Help needed to walk in hospital room?: A Little Help needed climbing 3-5 steps with a railing? : A Lot 6 Click Score: 18    End of Session Equipment Utilized During Treatment: Oxygen Activity Tolerance: Patient limited by fatigue Patient left: in chair;with call bell/phone within reach;with chair alarm set Nurse Communication: Mobility status PT Visit Diagnosis: Unsteadiness on feet (R26.81);Muscle weakness (generalized) (M62.81)     Time: 3112-1624 PT Time Calculation (min) (ACUTE ONLY): 27 min  Charges:  $Gait Training: 8-22 mins $Therapeutic Activity: 8-22 mins                     Zenaida Niece, PT, DPT Acute Rehabilitation Pager: [phone]    Zenaida Niece 06/01/2021, 11:43 AM

## 2021-06-01 NOTE — Progress Notes (Signed)
Patient ID: [name] [name], male   DOB: May 12, 1941, 80 y.o.   MRN: [id number] S: Feeling better but still with back pain and gross hematuria. O:BP (!) 143/44   Pulse 60   Temp 97.7 F (36.5 C) (Oral)   Resp 16   Ht 5\' 9"  (1.753 m)   Wt 84.2 kg   SpO2 99%   BMI 27.41 kg/m   Intake/Output Summary (Last 24 hours) at 06/01/2021 1147 Last data filed at 06/01/2021 0517 Gross per 24 hour  Intake 240 ml  Output 225 ml  Net 15 ml   Intake/Output: I/O last 3 completed shifts: In: 60 [P.O.:240; Blood:630; Other:10] Out: 350 [Urine:350]  Intake/Output this shift:  No intake/output data recorded. Weight change:  Gen:NAD CVS: RRR Resp: CTA Abd: +BS, soft, Nt/Nd Ext: no edema  Recent Labs  Lab 05/26/21 0058 05/27/21 0343 05/28/21 0420 05/29/21 0420 05/30/21 0257 05/31/21 0342  NA 137 138 138 138 135 135  K 5.0 4.5 4.5 4.0 4.0 3.5  CL 109 107 105 102 100 99  CO2 18* 19* 19* 23 25 28   GLUCOSE 96 82 90 96 109* 107*  BUN 87* 93* 99* 72* 82* 45*  CREATININE 9.09* 9.48* 9.36* 7.89* 8.61* 5.78*  CALCIUM 8.3* 8.2* 8.3* 8.0* 8.0* 7.9*   Liver Function Tests: No results for input(s): AST, ALT, ALKPHOS, BILITOT, PROT, ALBUMIN in the last 168 hours. No results for input(s): LIPASE, AMYLASE in the last 168 hours. No results for input(s): AMMONIA in the last 168 hours. CBC: Recent Labs  Lab 05/27/21 0343 05/28/21 0420 05/29/21 0420 05/30/21 0257 05/31/21 0342  WBC 11.2* 12.5* 12.9* 13.7* 10.0  NEUTROABS  --   --  10.6* 11.1* 7.7  HGB 7.6* 7.1* 6.5* 7.2* 6.9*  HCT 23.4* 21.8* 20.0* 22.7* 21.9*  MCV 95.9 96.5 99.0 98.3 98.2  PLT 237 240 193 201 217   Cardiac Enzymes: No results for input(s): CKTOTAL, CKMB, CKMBINDEX, TROPONINI in the last 168 hours. CBG: Recent Labs  Lab 05/31/21 0648 05/31/21 1146 05/31/21 1647 05/31/21 2107 06/01/21 0634  GLUCAP 98 121* 101* 98 94    Iron Studies: No results for input(s): IRON, TIBC, TRANSFERRIN, FERRITIN in the last 72  hours. Studies/Results: No results found.  sodium chloride   Intravenous Once   sodium chloride   Intravenous Once   amLODipine  5 mg Oral Daily   budesonide  0.25 mg Nebulization BID   carvedilol  3.125 mg Oral BID WC   Chlorhexidine Gluconate Cloth  6 each Topical Daily   feeding supplement (NEPRO CARB STEADY)  237 mL Oral TID BM   ferrous sulfate  325 mg Oral Q breakfast   insulin aspart  0-5 Units Subcutaneous QHS   insulin aspart  0-6 Units Subcutaneous TID WC   ipratropium-albuterol  3 mL Nebulization BID   levothyroxine  112 mcg Oral Q0600   multivitamin with minerals  1 tablet Oral Daily   pantoprazole  40 mg Oral BID   pravastatin  40 mg Oral q1800   Warfarin - Pharmacist Dosing Inpatient   Does not apply q1600    BMET    Component Value Date/Time   NA 135 05/31/2021 0342   NA 145 (H) 01/06/2021 1031   K 3.5 05/31/2021 0342   CL 99 05/31/2021 0342   CO2 28 05/31/2021 0342   GLUCOSE 107 (H) 05/31/2021 0342   BUN 45 (H) 05/31/2021 0342   BUN 27 01/06/2021 1031   CREATININE 5.78 (H) 05/31/2021 0342  CREATININE 1.31 (H) 07/14/2016 0915   CALCIUM 7.9 (L) 05/31/2021 0342   GFRNONAA 9 (L) 05/31/2021 0342   GFRAA 34 (L) 09/09/2020 0930   CBC    Component Value Date/Time   WBC 10.0 05/31/2021 0342   RBC 2.23 (L) 05/31/2021 0342   HGB 6.9 (LL) 05/31/2021 0342   HGB 10.5 (L) 09/09/2020 0930   HCT 21.9 (L) 05/31/2021 0342   HCT 31.6 (L) 09/09/2020 0930   PLT 217 05/31/2021 0342   PLT 231 09/09/2020 0930   MCV 98.2 05/31/2021 0342   MCV 97 09/09/2020 0930   MCH 30.9 05/31/2021 0342   MCHC 31.5 05/31/2021 0342   RDW 14.4 05/31/2021 0342   RDW 12.0 09/09/2020 0930   LYMPHSABS 0.6 (L) 05/31/2021 0342   LYMPHSABS 1.1 09/09/2020 0930   MONOABS 1.2 (H) 05/31/2021 0342   EOSABS 0.3 05/31/2021 0342   EOSABS 0.3 09/09/2020 0930   BASOSABS 0.1 05/31/2021 0342   BASOSABS 0.1 09/09/2020 0930     Assessment/Plan:  AKI/CKD stage IV, oliguric - presumably due to  ischemic ATN due to volume depletion and concomitant use of ARB, aldactone, and jardiance.  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  Started on HD 05/28/21 with temp HD catheter.  No evidence of renal recovery at this time and will try to arrange for outpatient HD as AKI patient.  Will consult IR for [facility] placement.  Plan for HD tomorrow.  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 ABLA and anemia of CKD stage IV - transfuse prn.  No ESA due to pelvic mass. A fib - coumadin on hold for [facility] placement.  CKD-BMD - stable Disposition - PT/OT evaluating.  Has significant weakness.  For CIR.  Donetta Potts, MD [employer] [phone]

## 2021-06-01 NOTE — Progress Notes (Addendum)
PROGRESS NOTE  [name] [name]  DOB: Oct 30, 1940  PCP: [name] Fraise, MD YQM:[id number]  DOA: 05/22/2021  LOS: 9 days  Hospital Day: 41  Chief complaint: abnormal labs  Brief narrative: [name] [name] is a 80 y.o. male with PMH significant for CAD s/p CABG, atrial fibrillation, CHF, AICD, COPD, CKD stage IIIb. Patient presented to the ED on 8/19 from his PCPs office for abnormal labs He had elevated potassium and creatinine. CT abdomen and pelvis showed mild right hydronephrosis. Admitted to hospitalist service.   Nephrology and urology were consulted.   See below for details  Subjective: Patient was seen and examined this morning. Lying on bed.  Not in distress. No labs today. [name] [name] continues to have minimal amount of blood-tinged urine.  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 -Creatinine improved with conservative measures -8/24, patient underwent triple-lumen catheter placement and has been started on dialysis. -Nephrology following.  History of COPD/asthma -Respiratory status stable at this time. -Continue DuoNeb, Pulmicort and albuterol  Bleeding from the triple-lumen catheter site Supratherapeutic INR -On 8/25, patient had significant bleeding last 24 hours from the triple-lumen catheter site probably because of supratherapeutic INR.  He was given a dose of desmopressin and 1 dose of vitamin K IV 1 mg.  INR improved and became subtherapeutic.  Coumadin was resumed.  INR still remains less than 2.  Noted a plan for tunneled dialysis catheter placement tomorrow.    Recent Labs  Lab 05/28/21 0420 05/29/21 0420 05/30/21 0257 05/31/21 0342 06/01/21 0357  INR 3.6* 2.4* 1.9* 1.8* 1.7*   Hematuria -Previously, CT renal study from 8/10 had shown mild hydronephrosis on the  right with gradual tapering of the proximal right ureter without a visible cause of hydronephrosis.   -In this admission, patient had hematuria on the first few days of admission and it resolved.  Bladder scan did not show any obstruction. UA showed proteinuria and hematuria.  Urology recommended outpatient work-up.   -However on 8/25, he started to hematuria again.  Probably related to supratherapeutic INR.  Hemovac continues to have blood tinged urine.  Discussed with urologist Dr. Tresa Moore today.  Recommended to remove Foley catheter.  Acute on chronic anemia -Hemoglobin was low at 6.9 on last check yesterday 8/28. 1 unit of PRBC was given.  Total 3 units of PRBCs transfused this hospitalization.  Currently is not bleeding from temporary dialysis catheter site but has mild hematuria. -Repeat hemoglobin tomorrow. Recent Labs    10/23/20 1200 05/13/21 1325 05/24/21 0121 05/25/21 0729 05/27/21 0343 05/28/21 0420 05/29/21 0420 05/30/21 0257 05/31/21 0342  HGB  --    < > 7.0*   < > 7.6* 7.1* 6.5* 7.2* 6.9*  MCV  --    < > 94.0   < > 95.9 96.5 99.0 98.3 98.2  VITAMINB12 464  --   --   --   --   --   --   --   --   FERRITIN  --   --  129  --   --   --   --   --   --   TIBC  --   --  189*  --   --   --   --   --   --   IRON  --   --  44*  --   --   --   --   --   --    < > =  values in this interval not displayed.   CHF Essential hypertension -Currently blood pressure is controlled on Coreg and amlodipine.   -Continue to hold hydralazine, Entresto and spironolactone.  A. Fib -Heart rate controlled on Coreg.  Plan for Coumadin as above.  CAD s/p AICD: -Continue Coreg and statin. last device check was 04/02/21  Carotid artery stenosis -Follows with vascular surgery.   Hyperkalemia/hyperphosphatemia -Corrected after dialysis was initiated.  Moderate protein calorie malnutrition Hypoalbuminemia -Continue protein supplements.  Hypothyroidism -Continue Synthroid   GERD -Continue  Protonix    Mobility: Encourage ambulation.  PT eval pending Code Status:   Code Status: Full Code  Nutritional status: Body mass index is 27.41 kg/m. Nutrition Problem: Increased nutrient needs Etiology: acute illness (AKI) Signs/Symptoms: estimated needs Diet:  Diet Order             Diet NPO time specified Except for: Sips with Meds  Diet effective midnight           Diet Heart Room service appropriate? Yes; Fluid consistency: Thin  Diet effective now                  DVT prophylaxis:  SCDs Start: 05/23/21 7353   Antimicrobials: None Fluid: None Consultants: Nephrology Family Communication: Wife at bedside  Status is: Inpatient  Remains inpatient appropriate because: Pending tunneled dialysis catheter placement tomorrow Dispo: The patient is from: Home              Anticipated d/c is to: Getting evaluated for CIR.              Patient currently is not medically stable to d/c.   Difficult to place patient No     Infusions:   [START ON 06/02/2021]  ceFAZolin (ANCEF) IV       Scheduled Meds:  sodium chloride   Intravenous Once   sodium chloride   Intravenous Once   amLODipine  5 mg Oral Daily   budesonide  0.25 mg Nebulization BID   carvedilol  3.125 mg Oral BID WC   Chlorhexidine Gluconate Cloth  6 each Topical Daily   feeding supplement (NEPRO CARB STEADY)  237 mL Oral TID BM   ferrous sulfate  325 mg Oral Q breakfast   insulin aspart  0-5 Units Subcutaneous QHS   insulin aspart  0-6 Units Subcutaneous TID WC   ipratropium-albuterol  3 mL Nebulization BID   levothyroxine  112 mcg Oral Q0600   multivitamin with minerals  1 tablet Oral Daily   pantoprazole  40 mg Oral BID   pravastatin  40 mg Oral q1800   Warfarin - Pharmacist Dosing Inpatient   Does not apply q1600    Antimicrobials: Anti-infectives (From admission, onward)    Start     Dose/Rate Route Frequency Ordered Stop   06/02/21 0000  ceFAZolin (ANCEF) IVPB 2g/100 mL premix        2  g 200 mL/hr over 30 Minutes Intravenous To Radiology 06/01/21 1415 06/03/21 0000       PRN meds: acetaminophen, albuterol, dextromethorphan-guaiFENesin, lidocaine (PF), senna-docusate, sodium chloride flush, traZODone   Objective: Vitals:   06/01/21 0856 06/01/21 1059  BP:  (!) 143/44  Pulse: 62 60  Resp: 18 16  Temp:  97.7 F (36.5 C)  SpO2:  99%    Intake/Output Summary (Last 24 hours) at 06/01/2021 1437 Last data filed at 06/01/2021 0800 Gross per 24 hour  Intake 240 ml  Output 225 ml  Net 15 ml   Danley Danker  Weights   05/29/21 2013 05/30/21 0805 05/30/21 2112  Weight: 83.6 kg 84.2 kg 84.2 kg   Weight change:  Body mass index is 27.41 kg/m.   Physical Exam: General exam: Pleasant, elderly Caucasian male.  Not in distress.  On 3 L oxygen by nasal cannula.  Urobag with blood tinged urine. Skin: No rashes, lesions or ulcers. HEENT: Atraumatic, normocephalic, no obvious bleeding Lungs: Diminished bilateral lower lobe air entry, otherwise clear to auscultation bilaterally. CVS: Regular rate and rhythm, no murmur GI/Abd soft, nontender, nondistended, bowel sound present CNS: Alert, awake, slow to respond, oriented to place and person Psychiatry: Depressed look Extremities: No pedal edema, no calf tenderness  Data Review: I have personally reviewed the laboratory data and studies available.  Recent Labs  Lab 05/27/21 0343 05/28/21 0420 05/29/21 0420 05/30/21 0257 05/31/21 0342  WBC 11.2* 12.5* 12.9* 13.7* 10.0  NEUTROABS  --   --  10.6* 11.1* 7.7  HGB 7.6* 7.1* 6.5* 7.2* 6.9*  HCT 23.4* 21.8* 20.0* 22.7* 21.9*  MCV 95.9 96.5 99.0 98.3 98.2  PLT 237 240 193 201 217   Recent Labs  Lab 05/26/21 0058 05/27/21 0343 05/28/21 0420 05/29/21 0420 05/30/21 0257 05/31/21 0342  NA 137 138 138 138 135 135  K 5.0 4.5 4.5 4.0 4.0 3.5  CL 109 107 105 102 100 99  CO2 18* 19* 19* 23 25 28   GLUCOSE 96 82 90 96 109* 107*  BUN 87* 93* 99* 72* 82* 45*  CREATININE 9.09* 9.48*  9.36* 7.89* 8.61* 5.78*  CALCIUM 8.3* 8.2* 8.3* 8.0* 8.0* 7.9*  MG 1.9 2.0 2.1  --   --   --     F/u labs ordered Unresulted Labs (From admission, onward)     Start     Ordered   06/02/21 5885  Basic metabolic panel  Daily,   R     Question:  Specimen collection method  Answer:  IV Team=IV Team collect   06/01/21 0803   06/02/21 0500  CBC with Differential/Platelet  Daily,   R     Question:  Specimen collection method  Answer:  IV Team=IV Team collect   06/01/21 0803   06/02/21 0500  Renal function panel  Tomorrow morning,   R       Question:  Specimen collection method  Answer:  IV Team=IV Team collect   06/01/21 1157   06/01/21 0803  CBC with Differential/Platelet  ONCE - STAT,   STAT       Question:  Specimen collection method  Answer:  IV Team=IV Team collect   06/01/21 0803   06/01/21 0277  Basic metabolic panel  ONCE - STAT,   STAT       Question:  Specimen collection method  Answer:  IV Team=IV Team collect   06/01/21 0803   05/25/21 0500  Protime-INR  Daily,   R      05/24/21 0816   Signed and Held  Renal function panel  Once,   R       Question:  Specimen collection method  Answer:  IV Team=IV Team collect   Signed and Held   Signed and Held  CBC  Once,   R       Question:  Specimen collection method  Answer:  IV Team=IV Team collect   Signed and Held            Signed, Terrilee Croak, MD [facility] 06/01/2021

## 2021-06-01 NOTE — Progress Notes (Signed)
ANTICOAGULATION CONSULT NOTE - Follow Up Consult  Pharmacy Consult for Warfarin Indication: atrial fibrillation  No Known Allergies  Patient Measurements: Height: 5\' 9"  (175.3 cm) Weight: 84.2 kg (185 lb 10 oz) IBW/kg (Calculated) : 70.7  Vital Signs: Temp: 97.7 F (36.5 C) (08/29 1059) Temp Source: Oral (08/29 1059) BP: 143/44 (08/29 1059) Pulse Rate: 60 (08/29 1059)  Labs: Recent Labs    05/30/21 0257 05/31/21 0342 06/01/21 0357  HGB 7.2* 6.9*  --   HCT 22.7* 21.9*  --   PLT 201 217  --   LABPROT 21.8* 21.1* 19.9*  INR 1.9* 1.8* 1.7*  CREATININE 8.61* 5.78*  --     Estimated Creatinine Clearance: 10 mL/min (A) (by C-G formula based on SCr of 5.78 mg/dL (H)).   Medications:  Scheduled:   sodium chloride   Intravenous Once   sodium chloride   Intravenous Once   amLODipine  5 mg Oral Daily   budesonide  0.25 mg Nebulization BID   carvedilol  3.125 mg Oral BID WC   Chlorhexidine Gluconate Cloth  6 each Topical Daily   feeding supplement (NEPRO CARB STEADY)  237 mL Oral TID BM   ferrous sulfate  325 mg Oral Q breakfast   insulin aspart  0-5 Units Subcutaneous QHS   insulin aspart  0-6 Units Subcutaneous TID WC   ipratropium-albuterol  3 mL Nebulization BID   levothyroxine  112 mcg Oral Q0600   multivitamin with minerals  1 tablet Oral Daily   pantoprazole  40 mg Oral BID   pravastatin  40 mg Oral q1800   Warfarin - Pharmacist Dosing Inpatient   Does not apply q1600   Infusions:   [START ON 06/02/2021]  ceFAZolin (ANCEF) IV      Assessment: 80yo male on warfarin PTA for Afib. Admit INR 2.7 was therapeutic on PTA regimen of warfarin 5mg  Tue and Fri, 2.5 mg all other days (recently changed 8/18).  Per chart review, patient experienced bleeding from the site of temporary dialysis catheter inserted on 8/24 and hematuria on 8/26. Patient received vitamin K 1 mg IV x1 on 8/25. Hemoglobin decreased 7.1 > 6.5, warfarin was then held 8/26 and patient received 1 unit of  pRBCs.   Resumed warfarin 8/28 post-HD. 8/28 h/h dropped 6.9/21.9 s/p x1 pRBC. Per chart review, no s/s bleeding from dialysis catheter, but mild hematuria noted.    INR still subtherapeutic @ 1.7. Warfarin will be held 8/29 PM due to IR procedure 8/30 TDC placement.   Goal of Therapy:  INR 2-3 Monitor platelets by anticoagulation protocol: Yes   Plan:  - Hold warfarin today for [facility] placement with IR 8/30 - F/u resuming warfarin after procedure - F/u daily INR, CBC - Monitor for s/sx of bleeding   Thank you for allowing pharmacy to be a part of this patient's care.  Ardyth Harps, PharmD Clinical Pharmacist

## 2021-06-01 NOTE — Progress Notes (Signed)
Inpatient Rehabilitation Admissions Coordinator   Inpatient rehab consult received. I met with patient with his spouse at bedside. We discussed goals and expectations of a possible CIR admit. They are in agreement. Once I have updated PT from today, I will then begin Auth with Elizabeth for a possible admit. Discussed with Dr Pietro Cassis at bedside who is coordinating possible tunneled catheter as well as Urology consult.  Danne Baxter, RN, MSN Rehab Admissions Coordinator [phone] 06/01/2021 11:03 AM

## 2021-06-02 ENCOUNTER — Inpatient Hospital Stay (HOSPITAL_COMMUNITY): Payer: Medicare Other

## 2021-06-02 DIAGNOSIS — N189 Chronic kidney disease, unspecified: Secondary | ICD-10-CM | POA: Diagnosis not present

## 2021-06-02 DIAGNOSIS — N179 Acute kidney failure, unspecified: Secondary | ICD-10-CM | POA: Diagnosis not present

## 2021-06-02 HISTORY — PX: IR FLUORO GUIDE CV LINE RIGHT: IMG2283

## 2021-06-02 HISTORY — PX: IR PERC TUN PERIT CATH WO PORT S&I /IMAG: IMG2327

## 2021-06-02 LAB — GLUCOSE, CAPILLARY
Glucose-Capillary: 83 mg/dL (ref 70–99)
Glucose-Capillary: 82 mg/dL (ref 70–99)
Glucose-Capillary: 89 mg/dL (ref 70–99)
Glucose-Capillary: 94 mg/dL (ref 70–99)

## 2021-06-02 LAB — RENAL FUNCTION PANEL
Albumin: 2.2 g/dL — ABNORMAL LOW (ref 3.5–5.0)
Anion gap: 12 (ref 5–15)
BUN: 61 mg/dL — ABNORMAL HIGH (ref 8–23)
CO2: 25 mmol/L (ref 22–32)
Calcium: 8.3 mg/dL — ABNORMAL LOW (ref 8.9–10.3)
Chloride: 94 mmol/L — ABNORMAL LOW (ref 98–111)
Creatinine, Ser: 7.17 mg/dL — ABNORMAL HIGH (ref 0.61–1.24)
GFR, Estimated: 7 mL/min — ABNORMAL LOW (ref >60)
Glucose, Bld: 85 mg/dL (ref 70–99)
Phosphorus: 4.8 mg/dL — ABNORMAL HIGH (ref 2.5–4.6)
Potassium: 3.6 mmol/L (ref 3.5–5.1)
Sodium: 131 mmol/L — ABNORMAL LOW (ref 135–145)

## 2021-06-02 LAB — CBC WITH DIFFERENTIAL/PLATELET
Abs Immature Granulocytes: 0.07 10*3/uL (ref 0.00–0.07)
Basophils Absolute: 0.1 10*3/uL (ref 0.0–0.1)
Basophils Relative: 1 %
Eosinophils Absolute: 0.5 10*3/uL (ref 0.0–0.5)
Eosinophils Relative: 4 %
HCT: 25.4 % — ABNORMAL LOW (ref 39.0–52.0)
Hemoglobin: 8.2 g/dL — ABNORMAL LOW (ref 13.0–17.0)
Immature Granulocytes: 1 %
Lymphocytes Relative: 8 %
Lymphs Abs: 0.9 10*3/uL (ref 0.7–4.0)
MCH: 30.6 pg (ref 26.0–34.0)
MCHC: 32.3 g/dL (ref 30.0–36.0)
MCV: 94.8 fL (ref 80.0–100.0)
Monocytes Absolute: 1.2 10*3/uL — ABNORMAL HIGH (ref 0.1–1.0)
Monocytes Relative: 11 %
Neutro Abs: 8.1 10*3/uL — ABNORMAL HIGH (ref 1.7–7.7)
Neutrophils Relative %: 75 %
Platelets: 256 10*3/uL (ref 150–400)
RBC: 2.68 MIL/uL — ABNORMAL LOW (ref 4.22–5.81)
RDW: 14.5 % (ref 11.5–15.5)
WBC: 10.8 10*3/uL — ABNORMAL HIGH (ref 4.0–10.5)
nRBC: 0 % (ref 0.0–0.2)

## 2021-06-02 LAB — PROTIME-INR
INR: 1.8 — ABNORMAL HIGH (ref 0.8–1.2)
Prothrombin Time: 20.9 seconds — ABNORMAL HIGH (ref 11.4–15.2)

## 2021-06-02 MED ORDER — FENTANYL CITRATE (PF) 100 MCG/2ML IJ SOLN
INTRAMUSCULAR | Status: AC
  Filled 2021-06-02: qty 2

## 2021-06-02 MED ORDER — MIDAZOLAM HCL 2 MG/2ML IJ SOLN
INTRAMUSCULAR | Status: AC
  Filled 2021-06-02: qty 2

## 2021-06-02 MED ORDER — HEPARIN SODIUM (PORCINE) 1000 UNIT/ML IJ SOLN
INTRAMUSCULAR | Status: AC
  Filled 2021-06-02: qty 1

## 2021-06-02 MED ORDER — WARFARIN SODIUM 2.5 MG PO TABS
2.5000 mg | ORAL_TABLET | Freq: Once | ORAL | Status: AC
  Administered 2021-06-02: 2.5 mg via ORAL
  Filled 2021-06-02: qty 1

## 2021-06-02 MED ORDER — FENTANYL CITRATE (PF) 100 MCG/2ML IJ SOLN
INTRAMUSCULAR | Status: AC | PRN
  Administered 2021-06-02: 25 ug via INTRAVENOUS

## 2021-06-02 MED ORDER — LIDOCAINE HCL 1 % IJ SOLN
INTRAMUSCULAR | Status: AC
  Filled 2021-06-02: qty 20

## 2021-06-02 MED ORDER — MIDAZOLAM HCL 2 MG/2ML IJ SOLN
INTRAMUSCULAR | Status: AC | PRN
  Administered 2021-06-02: 1 mg via INTRAVENOUS

## 2021-06-02 NOTE — Procedures (Signed)
Interventional Radiology Procedure Note  Procedure: Temp to tunneled HD catheter conversion  Indication: Renal failure  Findings: Please refer to procedural dictation for full description.  Complications: None  EBL: < 10 mL  Miachel Roux, MD [phone]

## 2021-06-02 NOTE — Progress Notes (Signed)
ANTICOAGULATION CONSULT NOTE - Follow Up Consult  Pharmacy Consult for Warfarin Indication: atrial fibrillation  No Known Allergies  Patient Measurements: Height: 5\' 9"  (175.3 cm) Weight: 82.9 kg (182 lb 12.2 oz) IBW/kg (Calculated) : 70.7  Vital Signs: Temp: 98.2 F (36.8 C) (08/30 1808) Temp Source: Oral (08/30 1111) BP: 159/61 (08/30 1808) Pulse Rate: 65 (08/30 1808)  Labs: Recent Labs    05/31/21 0342 06/01/21 0357 06/01/21 1429 06/02/21 0422  HGB 6.9*  --  8.7* 8.2*  HCT 21.9*  --  26.4* 25.4*  PLT 217  --  247 256  LABPROT 21.1* 19.9*  --  20.9*  INR 1.8* 1.7*  --  1.8*  CREATININE 5.78*  --  6.77* 7.17*     Estimated Creatinine Clearance: 8.1 mL/min (A) (by C-G formula based on SCr of 7.17 mg/dL (H)).   Assessment: 80yo male on warfarin PTA for Afib. Admit INR 2.7 was therapeutic on PTA regimen of warfarin 5mg  Tue and Fri, 2.5 mg all other days (recently changed 8/18).  Per chart review, patient experienced bleeding from the site of temporary dialysis catheter inserted on 8/24 and hematuria on 8/26. Patient received vitamin K 1 mg IV x1 on 8/25. Hemoglobin decreased 7.1 > 6.5, warfarin was then held 8/26 and patient received 1 unit of pRBCs.   Resumed warfarin 8/28 post-HD. 8/28 h/h dropped 6.9/21.9 s/p x1 pRBC. Per chart review, no s/s bleeding from dialysis catheter, but mild hematuria noted.    8/30 pm: INR subtherapeutic this morning at 1.8. Temporayr tunneled HD cath placed this afternoon. No post-op bleeding noted.   Goal of Therapy:  INR 2-3 Monitor platelets by anticoagulation protocol: Yes   Plan:  - Warfarin 2.5 mg PO tonight - F/u daily INR, CBC - Monitor for s/sx of bleeding  Thank you for involving pharmacy in this patient's care.  Renold Genta, PharmD, BCPS Clinical Pharmacist Clinical phone for 06/02/2021 until 10p is [phone] 06/02/2021 7:56 PM  **Pharmacist phone directory can be found on [email or web address] listed under [place]**

## 2021-06-02 NOTE — Plan of Care (Signed)
  Problem: Education: Goal: Knowledge of General Education information will improve Description: Including pain rating scale, medication(s)/side effects and non-pharmacologic comfort measures Outcome: Completed/Met   Problem: Health Behavior/Discharge Planning: Goal: Ability to manage health-related needs will improve Outcome: Completed/Met   Problem: Clinical Measurements: Goal: Will remain free from infection Outcome: Completed/Met Goal: Diagnostic test results will improve Outcome: Completed/Met   Problem: Nutrition: Goal: Adequate nutrition will be maintained Outcome: Completed/Met   Problem: Coping: Goal: Level of anxiety will decrease Outcome: Completed/Met

## 2021-06-02 NOTE — Plan of Care (Signed)
°  Problem: Clinical Measurements: °Goal: Respiratory complications will improve °Outcome: Progressing °Goal: Cardiovascular complication will be avoided °Outcome: Progressing °  °Problem: Activity: °Goal: Risk for activity intolerance will decrease °Outcome: Progressing °  °

## 2021-06-02 NOTE — Plan of Care (Signed)
  Problem: Clinical Measurements: Goal: Ability to maintain clinical measurements within normal limits will improve Outcome: Progressing   Problem: Elimination: Goal: Will not experience complications related to bowel motility Outcome: Progressing   Problem: Pain Managment: Goal: General experience of comfort will improve Outcome: Progressing   Problem: Safety: Goal: Ability to remain free from injury will improve Outcome: Progressing   Problem: Skin Integrity: Goal: Risk for impaired skin integrity will decrease Outcome: Progressing

## 2021-06-02 NOTE — Progress Notes (Signed)
PT Cancellation Note  Patient Details Name: [name] [name] MRN: [id number] DOB: 05-15-1941   Cancelled Treatment:    Reason Eval/Treat Not Completed: Patient at procedure or test/unavailable.  Will follow up as time and pt allow.   Ramond Dial 06/02/2021, 11:17 AM  Mee Hives, PT MS Acute Rehab Dept. Number: [place] and [place]

## 2021-06-02 NOTE — Progress Notes (Signed)
Patient ID: Riaan PEYSON POSTEMA, male   DOB: [birth year], 80 y.o.   MRN: [id number] S: Seen on HD and complaining of SOB. O:BP (!) 140/46 (BP Location: Left Arm)   Pulse (!) 59   Temp 97.8 F (36.6 C) (Oral)   Resp 20   Ht 5\' 9"  (1.753 m)   Wt 84.1 kg   SpO2 100%   BMI 27.38 kg/m   Intake/Output Summary (Last 24 hours) at 06/02/2021 0909 Last data filed at 06/02/2021 0600 Gross per 24 hour  Intake 180 ml  Output 100 ml  Net 80 ml   Intake/Output: I/O last 3 completed shifts: In: 300 [P.O.:300] Out: 125 [Urine:125]  Intake/Output this shift:  No intake/output data recorded. Weight change:  Gen:NAD CVS: bradycardic at 59 Resp:bilateral end exp wheezes Abd:_BS, soft, NT/ND Ext:1+ edema of ankles and left arm  Recent Labs  Lab 05/27/21 0343 05/28/21 0420 05/29/21 0420 05/30/21 0257 05/31/21 0342 06/01/21 1429 06/02/21 0422  NA 138 138 138 135 135 131* 131*  K 4.5 4.5 4.0 4.0 3.5 3.4* 3.6  CL 107 105 102 100 99 96* 94*  CO2 19* 19* 23 25 28 26 25   GLUCOSE 82 90 96 109* 107* 119* 85  BUN 93* 99* 72* 82* 45* 58* 61*  CREATININE 9.48* 9.36* 7.89* 8.61* 5.78* 6.77* 7.17*  ALBUMIN  --   --   --   --   --   --  2.2*  CALCIUM 8.2* 8.3* 8.0* 8.0* 7.9* 7.8* 8.3*  PHOS  --   --   --   --   --   --  4.8*   Liver Function Tests: Recent Labs  Lab 06/02/21 0422  ALBUMIN 2.2*   No results for input(s): LIPASE, AMYLASE in the last 168 hours. No results for input(s): AMMONIA in the last 168 hours. CBC: Recent Labs  Lab 05/29/21 0420 05/30/21 0257 05/31/21 0342 06/01/21 1429 06/02/21 0422  WBC 12.9* 13.7* 10.0 11.7* 10.8*  NEUTROABS 10.6* 11.1* 7.7 9.5* 8.1*  HGB 6.5* 7.2* 6.9* 8.7* 8.2*  HCT 20.0* 22.7* 21.9* 26.4* 25.4*  MCV 99.0 98.3 98.2 95.3 94.8  PLT 193 201 217 247 256   Cardiac Enzymes: No results for input(s): CKTOTAL, CKMB, CKMBINDEX, TROPONINI in the last 168 hours. CBG: Recent Labs  Lab 06/01/21 0634 06/01/21 1145 06/01/21 1701 06/01/21 2132  06/02/21 0645  GLUCAP 94 93 121* 92 83    Iron Studies: No results for input(s): IRON, TIBC, TRANSFERRIN, FERRITIN in the last 72 hours. Studies/Results: No results found.  sodium chloride   Intravenous Once   sodium chloride   Intravenous Once   amLODipine  5 mg Oral Daily   budesonide  0.25 mg Nebulization BID   carvedilol  3.125 mg Oral BID WC   Chlorhexidine Gluconate Cloth  6 each Topical Daily   feeding supplement (NEPRO CARB STEADY)  237 mL Oral TID BM   ferrous sulfate  325 mg Oral Q breakfast   insulin aspart  0-5 Units Subcutaneous QHS   insulin aspart  0-6 Units Subcutaneous TID WC   ipratropium-albuterol  3 mL Nebulization BID   levothyroxine  112 mcg Oral Q0600   multivitamin with minerals  1 tablet Oral Daily   pantoprazole  40 mg Oral BID   pravastatin  40 mg Oral q1800   Warfarin - Pharmacist Dosing Inpatient   Does not apply q1600    BMET    Component Value Date/Time   NA 131 (L) 06/02/2021 0422  NA 145 (H) 01/06/2021 1031   K 3.6 06/02/2021 0422   CL 94 (L) 06/02/2021 0422   CO2 25 06/02/2021 0422   GLUCOSE 85 06/02/2021 0422   BUN 61 (H) 06/02/2021 0422   BUN 27 01/06/2021 1031   CREATININE 7.17 (H) 06/02/2021 0422   CREATININE 1.31 (H) 07/14/2016 0915   CALCIUM 8.3 (L) 06/02/2021 0422   GFRNONAA 7 (L) 06/02/2021 0422   GFRAA 34 (L) 09/09/2020 0930   CBC    Component Value Date/Time   WBC 10.8 (H) 06/02/2021 0422   RBC 2.68 (L) 06/02/2021 0422   HGB 8.2 (L) 06/02/2021 0422   HGB 10.5 (L) 09/09/2020 0930   HCT 25.4 (L) 06/02/2021 0422   HCT 31.6 (L) 09/09/2020 0930   PLT 256 06/02/2021 0422   PLT 231 09/09/2020 0930   MCV 94.8 06/02/2021 0422   MCV 97 09/09/2020 0930   MCH 30.6 06/02/2021 0422   MCHC 32.3 06/02/2021 0422   RDW 14.5 06/02/2021 0422   RDW 12.0 09/09/2020 0930   LYMPHSABS 0.9 06/02/2021 0422   LYMPHSABS 1.1 09/09/2020 0930   MONOABS 1.2 (H) 06/02/2021 0422   EOSABS 0.5 06/02/2021 0422   EOSABS 0.3 09/09/2020 0930    BASOSABS 0.1 06/02/2021 0422   BASOSABS 0.1 09/09/2020 0930     Assessment/Plan:   AKI/CKD stage IV, oliguric - presumably due to ischemic ATN due to volume depletion and concomitant use of ARB, aldactone, and jardiance.  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  Started on HD 05/28/21 with temp HD catheter.  No evidence of renal recovery at this time and will try to arrange for outpatient HD as AKI patient.  Have consulted IR for [facility] placement.   Continue on TTS schedule for now and follow for renal recovery.  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 ABLA and anemia of CKD stage IV - transfuse prn.  No ESA due to pelvic mass. A fib - coumadin on hold for [facility] placement.  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 Disposition - PT/OT evaluating.  Has significant weakness.  For CIR.  Donetta Potts, MD [employer] [phone]

## 2021-06-02 NOTE — Progress Notes (Signed)
PT Cancellation Note  Patient Details Name: [name] [name] MRN: [id number] DOB: 25-Oct-1940   Cancelled Treatment:    Reason Eval/Treat Not Completed: Other (comment).  Reports he is too tired for any therapy, asking PT to come back at end of day.  Retry as time and pt allow.   Ramond Dial 06/02/2021, 12:44 PM  Mee Hives, PT MS Acute Rehab Dept. Number: [place] and [place]

## 2021-06-02 NOTE — Progress Notes (Signed)
PROGRESS NOTE  [name] [name]  DOB: [birth year]  PCP: Claretta Fraise, MD NID:[id number]  DOA: 05/22/2021  LOS: 10 days  Hospital Day: 12  Chief complaint: abnormal labs  Brief narrative: [name] [name] is a 80 y.o. male with PMH significant for CAD s/p CABG, atrial fibrillation, CHF, AICD, COPD, CKD stage IIIb. Patient presented to the ED on 8/19 from his PCPs office for abnormal labs He had elevated potassium and creatinine. CT abdomen and pelvis showed mild right hydronephrosis. Admitted to hospitalist service.   Nephrology and urology were consulted.   See below for details  Subjective: Patient was seen and examined this morning. Not in distress.  Underwent dialysis this morning.  Catheter was removed yesterday.  Hematuria seems to have resolved.  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 -Creatinine improved with conservative measures -8/24, patient underwent triple-lumen catheter placement and started on dialysis. -8/30, tunneled dialysis catheter planned-Nephrology following.  History of COPD/asthma -Respiratory status stable at this time. -Continue DuoNeb, Pulmicort and albuterol  Bleeding from the triple-lumen catheter site Supratherapeutic INR -On 8/25, patient had significant bleeding last 24 hours from the triple-lumen catheter site probably because of supratherapeutic INR.  He was given a dose of desmopressin and 1 dose of vitamin K IV 1 mg.  INR improved and so did the bleeding. -INR running subtherapeutic.  Resume Coumadin today Recent Labs  Lab 05/29/21 0420 05/30/21 0257 05/31/21 0342 06/01/21 0357 06/02/21 0422  INR 2.4* 1.9* 1.8* 1.7* 1.8*    Hematuria -Previously, CT renal study from 8/10 had shown mild hydronephrosis on the right with gradual tapering of  the proximal right ureter without a visible cause of hydronephrosis.   -In this admission, patient had hematuria on the first few days of admission and it resolved.  Bladder scan did not show any obstruction. UA showed proteinuria and hematuria.  Urology recommended outpatient work-up.   -However on 8/25, he started to hematuria again.  Probably related to supratherapeutic INR.  Hemovac continues to have blood tinged urine.  Discussed with urologist Dr. Tresa Moore on 8/29.  Recommended to remove Foley catheter. -8/30, hematuria seems to have resolved.  Continue to monitor.  Acute on chronic anemia -Hemoglobin is currently stable between 8 to 9.  Total 3 units of PRBCs transfused this hospitalization.  Currently is not bleeding from temporary dialysis catheter site but has mild hematuria. -Repeat hemoglobin tomorrow. Recent Labs    10/23/20 1200 05/13/21 1325 05/24/21 0121 05/25/21 0729 05/29/21 0420 05/30/21 0257 05/31/21 0342 06/01/21 1429 06/02/21 0422  HGB  --    < > 7.0*   < > 6.5* 7.2* 6.9* 8.7* 8.2*  MCV  --    < > 94.0   < > 99.0 98.3 98.2 95.3 94.8  VITAMINB12 464  --   --   --   --   --   --   --   --   FERRITIN  --   --  129  --   --   --   --   --   --   TIBC  --   --  189*  --   --   --   --   --   --   IRON  --   --  44*  --   --   --   --   --   --    < > =  values in this interval not displayed.    CHF Essential hypertension -Currently blood pressure is controlled on Coreg and amlodipine.   -Continue to hold hydralazine, Entresto and spironolactone.  A. Fib -Heart rate controlled on Coreg.  Plan for Coumadin as above.  CAD s/p AICD: -Continue Coreg and statin. last device check was 04/02/21  Carotid artery stenosis -Follows with vascular surgery.   Hyperkalemia/hyperphosphatemia -Corrected after dialysis was initiated.  Moderate protein calorie malnutrition Hypoalbuminemia -Continue protein supplements.  Hypothyroidism -Continue Synthroid   GERD -Continue  Protonix    Mobility: Encourage ambulation.  PT eval appreciated.  CIR pending. Code Status:   Code Status: Full Code  Nutritional status: Body mass index is 26.99 kg/m. Nutrition Problem: Increased nutrient needs Etiology: acute illness (AKI) Signs/Symptoms: estimated needs Diet:  Diet Order             Diet NPO time specified Except for: Sips with Meds  Diet effective midnight                  DVT prophylaxis:  SCDs Start: 05/23/21 6440   Antimicrobials: None Fluid: None Consultants: Nephrology Family Communication: Wife at bedside  Status is: Inpatient  Remains inpatient appropriate because: Pending tunneled dialysis catheter placement tomorrow Dispo: The patient is from: Home              Anticipated d/c is to: Getting evaluated for CIR.              Patient currently is not medically stable to d/c.   Difficult to place patient No     Infusions:    ceFAZolin (ANCEF) IV       Scheduled Meds:  sodium chloride   Intravenous Once   sodium chloride   Intravenous Once   amLODipine  5 mg Oral Daily   budesonide  0.25 mg Nebulization BID   carvedilol  3.125 mg Oral BID WC   Chlorhexidine Gluconate Cloth  6 each Topical Daily   feeding supplement (NEPRO CARB STEADY)  237 mL Oral TID BM   ferrous sulfate  325 mg Oral Q breakfast   insulin aspart  0-5 Units Subcutaneous QHS   insulin aspart  0-6 Units Subcutaneous TID WC   ipratropium-albuterol  3 mL Nebulization BID   levothyroxine  112 mcg Oral Q0600   multivitamin with minerals  1 tablet Oral Daily   pantoprazole  40 mg Oral BID   pravastatin  40 mg Oral q1800   Warfarin - Pharmacist Dosing Inpatient   Does not apply q1600    Antimicrobials: Anti-infectives (From admission, onward)    Start     Dose/Rate Route Frequency Ordered Stop   06/02/21 0000  ceFAZolin (ANCEF) IVPB 2g/100 mL premix        2 g 200 mL/hr over 30 Minutes Intravenous To Radiology 06/01/21 1415 06/03/21 0000       PRN  meds: acetaminophen, albuterol, dextromethorphan-guaiFENesin, lidocaine (PF), senna-docusate, sodium chloride flush, traZODone   Objective: Vitals:   06/02/21 1030 06/02/21 1111  BP: (!) 161/61 (!) 166/58  Pulse: 60 61  Resp: 15 16  Temp:  97.7 F (36.5 C)  SpO2: 100% 100%    Intake/Output Summary (Last 24 hours) at 06/02/2021 1509 Last data filed at 06/02/2021 1300 Gross per 24 hour  Intake 60 ml  Output 1600 ml  Net -1540 ml    Filed Weights   06/01/21 2031 06/02/21 0754 06/02/21 1111  Weight: 84.1 kg 83.9 kg 82.9 kg   Weight  change:  Body mass index is 26.99 kg/m.   Physical Exam: General exam: Pleasant, elderly Caucasian male.  Not in distress.  On 3 L oxygen by nasal cannula.  External catheter without blood  skin: No rashes, lesions or ulcers. HEENT: Atraumatic, normocephalic, no obvious bleeding Lungs: Diminished bilateral lower lobe air entry, otherwise clear to auscultation bilaterally. CVS: Regular rate and rhythm, no murmur GI/Abd soft, nontender, nondistended, bowel sound present CNS: Alert, awake, slow to respond, oriented to place and person Psychiatry: Depressed look Extremities: No pedal edema, no calf tenderness  Data Review: I have personally reviewed the laboratory data and studies available.  Recent Labs  Lab 05/29/21 0420 05/30/21 0257 05/31/21 0342 06/01/21 1429 06/02/21 0422  WBC 12.9* 13.7* 10.0 11.7* 10.8*  NEUTROABS 10.6* 11.1* 7.7 9.5* 8.1*  HGB 6.5* 7.2* 6.9* 8.7* 8.2*  HCT 20.0* 22.7* 21.9* 26.4* 25.4*  MCV 99.0 98.3 98.2 95.3 94.8  PLT 193 201 217 247 256    Recent Labs  Lab 05/27/21 0343 05/28/21 0420 05/29/21 0420 05/30/21 0257 05/31/21 0342 06/01/21 1429 06/02/21 0422  NA 138 138 138 135 135 131* 131*  K 4.5 4.5 4.0 4.0 3.5 3.4* 3.6  CL 107 105 102 100 99 96* 94*  CO2 19* 19* 23 25 28 26 25   GLUCOSE 82 90 96 109* 107* 119* 85  BUN 93* 99* 72* 82* 45* 58* 61*  CREATININE 9.48* 9.36* 7.89* 8.61* 5.78* 6.77* 7.17*   CALCIUM 8.2* 8.3* 8.0* 8.0* 7.9* 7.8* 8.3*  MG 2.0 2.1  --   --   --   --   --   PHOS  --   --   --   --   --   --  4.8*     F/u labs ordered Unresulted Labs (From admission, onward)     Start     Ordered   06/02/21 3299  Basic metabolic panel  Daily,   R     Question:  Specimen collection method  Answer:  IV Team=IV Team collect   06/01/21 0803   06/02/21 0500  CBC with Differential/Platelet  Daily,   R     Question:  Specimen collection method  Answer:  IV Team=IV Team collect   06/01/21 0803   05/25/21 0500  Protime-INR  Daily,   R      05/24/21 0816   Signed and Held  Renal function panel  Once,   R       Question:  Specimen collection method  Answer:  IV Team=IV Team collect   Signed and Held   Signed and Held  CBC  Once,   R       Question:  Specimen collection method  Answer:  IV Team=IV Team collect   Signed and Held            Signed, Terrilee Croak, MD [facility] 06/02/2021

## 2021-06-02 NOTE — Progress Notes (Signed)
Patient continues to have nausea and vomiting.. Patient drank not even half of first bottle.

## 2021-06-03 ENCOUNTER — Encounter (HOSPITAL_COMMUNITY): Payer: Self-pay

## 2021-06-03 DIAGNOSIS — N179 Acute kidney failure, unspecified: Secondary | ICD-10-CM | POA: Diagnosis not present

## 2021-06-03 DIAGNOSIS — N189 Chronic kidney disease, unspecified: Secondary | ICD-10-CM | POA: Diagnosis not present

## 2021-06-03 LAB — CBC WITH DIFFERENTIAL/PLATELET
Abs Immature Granulocytes: 0.1 10*3/uL — ABNORMAL HIGH (ref 0.00–0.07)
Basophils Absolute: 0.1 10*3/uL (ref 0.0–0.1)
Basophils Relative: 1 %
Eosinophils Absolute: 0.3 10*3/uL (ref 0.0–0.5)
Eosinophils Relative: 3 %
HCT: 25.5 % — ABNORMAL LOW (ref 39.0–52.0)
Hemoglobin: 8.2 g/dL — ABNORMAL LOW (ref 13.0–17.0)
Immature Granulocytes: 1 %
Lymphocytes Relative: 7 %
Lymphs Abs: 0.7 10*3/uL (ref 0.7–4.0)
MCH: 31.1 pg (ref 26.0–34.0)
MCHC: 32.2 g/dL (ref 30.0–36.0)
MCV: 96.6 fL (ref 80.0–100.0)
Monocytes Absolute: 1.2 10*3/uL — ABNORMAL HIGH (ref 0.1–1.0)
Monocytes Relative: 13 %
Neutro Abs: 6.9 10*3/uL (ref 1.7–7.7)
Neutrophils Relative %: 75 %
Platelets: 213 10*3/uL (ref 150–400)
RBC: 2.64 MIL/uL — ABNORMAL LOW (ref 4.22–5.81)
RDW: 14.1 % (ref 11.5–15.5)
WBC: 9.3 10*3/uL (ref 4.0–10.5)
nRBC: 0 % (ref 0.0–0.2)

## 2021-06-03 LAB — BASIC METABOLIC PANEL
Anion gap: 10 (ref 5–15)
BUN: 37 mg/dL — ABNORMAL HIGH (ref 8–23)
CO2: 24 mmol/L (ref 22–32)
Calcium: 7.8 mg/dL — ABNORMAL LOW (ref 8.9–10.3)
Chloride: 99 mmol/L (ref 98–111)
Creatinine, Ser: 5.16 mg/dL — ABNORMAL HIGH (ref 0.61–1.24)
GFR, Estimated: 11 mL/min — ABNORMAL LOW (ref >60)
Glucose, Bld: 70 mg/dL (ref 70–99)
Potassium: 3.8 mmol/L (ref 3.5–5.1)
Sodium: 133 mmol/L — ABNORMAL LOW (ref 135–145)

## 2021-06-03 LAB — PROTIME-INR
INR: 2.3 — ABNORMAL HIGH (ref 0.8–1.2)
Prothrombin Time: 25 seconds — ABNORMAL HIGH (ref 11.4–15.2)

## 2021-06-03 LAB — GLUCOSE, CAPILLARY
Glucose-Capillary: 70 mg/dL (ref 70–99)
Glucose-Capillary: 70 mg/dL (ref 70–99)
Glucose-Capillary: 78 mg/dL (ref 70–99)
Glucose-Capillary: 79 mg/dL (ref 70–99)

## 2021-06-03 MED ORDER — "THROMBI-PAD 3""X3"" EX PADS"
1.0000 | MEDICATED_PAD | Freq: Once | CUTANEOUS | Status: AC
  Administered 2021-06-03: 1 via TOPICAL
  Filled 2021-06-03: qty 1

## 2021-06-03 MED ORDER — ONDANSETRON HCL 4 MG/2ML IJ SOLN: 4.0000 mg | Freq: Four times a day (QID) | INTRAMUSCULAR | Status: DC | PRN

## 2021-06-03 NOTE — Progress Notes (Signed)
Inpatient Rehabilitation Admissions Coordinator   [facility] Medicare has denied CIR approval after peer to peer completed with Dr Sophronia Simas today. I met wt bedside with patient and wife and they are aware. They request I appeal that determination which I will begin today. It typically takes 48 to 72 hrs for that appeal process. Patient and wife deny SNF placement. They are asking about Outpt dialysis chair and I will alert Marya Amsler, dialysis SW of their request. Acute team and TOC made aware.  Danne Baxter, RN, MSN Rehab Admissions Coordinator [phone] 06/03/2021 12:04 PM

## 2021-06-03 NOTE — Progress Notes (Signed)
Consult received d/t concerns with continued bleeding from HD site. Secure chat sent to Johnson and Dr. Pietro Cassis. Concern with bleeding HD site has been resolved d/t IR PA assessing patient. Notified by nurse Arline Asp during [place]. Fran Lowes, RN VAST

## 2021-06-03 NOTE — TOC Progression Note (Signed)
Transition of Care [facility]) - Progression Note    Patient Details  Name: [name] [name] MRN: [id number] Date of Birth: [birth year]  Transition of Care [facility]) CM/SW Contact  Tom-Johnson, Renea Ee, RN Phone Number: 06/03/2021, 12:02 PM  Clinical Narrative:    Insurance declined authorization and Peer to Peer was denied. Patient and family request an appeal. [employer] with CIR will appeal. TOC will continue to follow with needs.   Expected Discharge Plan:  (Awaiting PT eval.) Barriers to Discharge: Continued Medical Work up  Expected Discharge Plan and Services Expected Discharge Plan:  (Awaiting PT eval.)   Discharge Planning Services: CM Consult   Living arrangements for the past 2 months: Single Family Home                                       Social Determinants of Health (SDOH) Interventions    Readmission Risk Interventions No flowsheet data found.

## 2021-06-03 NOTE — Progress Notes (Signed)
PT Cancellation Note  Patient Details Name: [name] [name] MRN: [id number] DOB: [birth year]   Cancelled Treatment:    Reason Eval/Treat Not Completed: Medical issues which prohibited therapy. Pt reports nausea and recent dressing change from dialysis cath site due to bleeding this morning. PT will follow up as time allows.   Zenaida Niece 06/03/2021, 11:13 AM

## 2021-06-03 NOTE — Progress Notes (Addendum)
Out Patient Arrangements:  Noted pt may go to rehab. If so, will need the location as this will impact HD arrangements.   Have been requested to arrange out pt HD for pt. Have submitted medical records to [facility] admissions. Please advise of d/c date.   Linus Orn [facility] [phone]

## 2021-06-03 NOTE — Progress Notes (Signed)
PROGRESS NOTE  [name] [name]  DOB: 10-13-1940  PCP: Claretta Fraise, MD EHU:[id number]  DOA: 05/22/2021  LOS: 11 days  Hospital Day: 101  Chief complaint: abnormal labs  Brief narrative: [name] [name] is a 80 y.o. male with PMH significant for CAD s/p CABG, atrial fibrillation, CHF, AICD, COPD, CKD stage IIIb. Patient presented to the ED on 8/19 from his PCPs office for abnormal labs He had elevated potassium and creatinine. CT abdomen and pelvis showed mild right hydronephrosis. Admitted to hospitalist service.   Nephrology and urology were consulted.   See below for details  Subjective: Patient was seen and examined this morning. Lying in bed.  Not in distress.  He was bleeding from the permanent dialysis catheter insertion site on the right anterior chest wall.  No other complaint.    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 -Creatinine improved with conservative measures -8/24, patient underwent triple-lumen catheter placement and started on dialysis. -8/30, tunneled dialysis catheter planned-Nephrology following.  Bleeding from the dialysis catheter site -This morning, patient is bleeding from the tunneled IJ catheter placement site on the right anterior chest wall.  Cleaning and packing done by IR.  Bleeding stopped temporarily.   -During this hospitalization, patient has had episodes of bleeding from triple dialysis catheter site as well as hematuria.  Coumadin was transiently held, he was given vitamin K and desmopressin. -Currently on Coumadin.  INR 2.3 today.  Plan to hold Coumadin tonight.   Recent Labs  Lab 05/30/21 0257 05/31/21 0342 06/01/21 0357 06/02/21 0422 06/03/21 0331  INR 1.9* 1.8* 1.7* 1.8* 2.3*    Hematuria -Intermittently passing blood in urine.    -Previously, CT renal study from 8/10 had shown mild hydronephrosis on the right with gradual tapering of the proximal right ureter without a visible cause of hydronephrosis.   -Urology consult obtained.  Catheter removed on 8/29. -As minimal blood-tinged urine on external urinary catheter. -Urology recommended outpatient work-up.  Acute on chronic anemia -Hemoglobin is currently stable between 8 to 9.  Total 3 units of PRBCs transfused this hospitalization.  -Repeat hemoglobin tomorrow. Recent Labs    10/23/20 1200 05/13/21 1325 05/24/21 0121 05/25/21 0729 05/30/21 0257 05/31/21 0342 06/01/21 1429 06/02/21 0422 06/03/21 0331  HGB  --    < > 7.0*   < > 7.2* 6.9* 8.7* 8.2* 8.2*  MCV  --    < > 94.0   < > 98.3 98.2 95.3 94.8 96.6  VITAMINB12 464  --   --   --   --   --   --   --   --   FERRITIN  --   --  129  --   --   --   --   --   --   TIBC  --   --  189*  --   --   --   --   --   --   IRON  --   --  44*  --   --   --   --   --   --    < > = values in this interval not displayed.   History of COPD/asthma -Respiratory status stable at this time. -Continue DuoNeb, Pulmicort and albuterol  CHF Essential hypertension -Currently blood pressure is controlled on Coreg and amlodipine.   -Continue to hold hydralazine,  Entresto and spironolactone.  A. Fib -Heart rate controlled on Coreg.  Plan for Coumadin as above.  CAD s/p AICD: -Continue Coreg and statin. Last device check was 04/02/21.  Carotid artery stenosis -Follows with vascular surgery.   Hyperkalemia/hyperphosphatemia -Corrected after dialysis was initiated.  Moderate protein calorie malnutrition Hypoalbuminemia -Continue protein supplements.  Hypothyroidism -Continue Synthroid   GERD -Continue Protonix    Mobility: Encourage ambulation.  PT eval appreciated.  Plan for CIR but insurance denied.  Peer to peer was tried this morning, failed.  It seems family is filing for an appeal. Code Status:   Code  Status: Full Code  Nutritional status: Body mass index is 27.31 kg/m. Nutrition Problem: Increased nutrient needs Etiology: acute illness (AKI) Signs/Symptoms: estimated needs Diet:  Diet Order             Diet Heart Room service appropriate? Yes; Fluid consistency: Thin  Diet effective now                  DVT prophylaxis:  SCDs Start: 05/23/21 3244   Antimicrobials: None Fluid: None Consultants: Nephrology Family Communication: Wife not at bedside today  Status is: Inpatient  Remains inpatient appropriate because: Pending tunneled dialysis catheter placement tomorrow Dispo: The patient is from: Home              Anticipated d/c is to: Likely SNF.              Patient currently is not medically stable to d/c.   Difficult to place patient No    Infusions:      Scheduled Meds:  sodium chloride   Intravenous Once   amLODipine  5 mg Oral Daily   budesonide  0.25 mg Nebulization BID   carvedilol  3.125 mg Oral BID WC   Chlorhexidine Gluconate Cloth  6 each Topical Daily   feeding supplement (NEPRO CARB STEADY)  237 mL Oral TID BM   ferrous sulfate  325 mg Oral Q breakfast   insulin aspart  0-5 Units Subcutaneous QHS   insulin aspart  0-6 Units Subcutaneous TID WC   ipratropium-albuterol  3 mL Nebulization BID   levothyroxine  112 mcg Oral Q0600   multivitamin with minerals  1 tablet Oral Daily   pantoprazole  40 mg Oral BID   pravastatin  40 mg Oral q1800   Warfarin - Pharmacist Dosing Inpatient   Does not apply q1600    Antimicrobials: Anti-infectives (From admission, onward)    Start     Dose/Rate Route Frequency Ordered Stop   06/02/21 0000  ceFAZolin (ANCEF) IVPB 2g/100 mL premix        2 g 200 mL/hr over 30 Minutes Intravenous To Radiology 06/01/21 1415 06/02/21 1617       PRN meds: acetaminophen, albuterol, dextromethorphan-guaiFENesin, ondansetron (ZOFRAN) IV, senna-docusate, sodium chloride flush, traZODone   Objective: Vitals:    06/03/21 0834 06/03/21 0904  BP:  (!) 152/49  Pulse:  60  Resp:  18  Temp:    SpO2: 99% 98%    Intake/Output Summary (Last 24 hours) at 06/03/2021 1357 Last data filed at 06/03/2021 0700 Gross per 24 hour  Intake 420 ml  Output 50 ml  Net 370 ml    Filed Weights   06/02/21 0754 06/02/21 1111 06/02/21 2100  Weight: 83.9 kg 82.9 kg 83.9 kg   Weight change: -0.2 kg Body mass index is 27.31 kg/m.   Physical Exam: General exam: Pleasant, elderly Caucasian male.  Not in distress.  On 3 L oxygen by nasal cannula.  External catheter without blood  skin: No rashes, lesions or ulcers. HEENT: Atraumatic, normocephalic, no obvious bleeding Lungs: Diminished bilateral lower lobe air entry, otherwise clear to auscultation bilaterally. Chest wall: Right anterior chest wall with tunneled catheter insertion site soakage and bleeding. CVS: Regular rate and rhythm, no murmur GI/Abd soft, nontender, nondistended, bowel sound present CNS: Alert, awake, slow to respond, oriented to place and person Psychiatry: Depressed look Extremities: No pedal edema, no calf tenderness  Data Review: I have personally reviewed the laboratory data and studies available.  Recent Labs  Lab 05/30/21 0257 05/31/21 0342 06/01/21 1429 06/02/21 0422 06/03/21 0331  WBC 13.7* 10.0 11.7* 10.8* 9.3  NEUTROABS 11.1* 7.7 9.5* 8.1* 6.9  HGB 7.2* 6.9* 8.7* 8.2* 8.2*  HCT 22.7* 21.9* 26.4* 25.4* 25.5*  MCV 98.3 98.2 95.3 94.8 96.6  PLT 201 217 247 256 213    Recent Labs  Lab 05/28/21 0420 05/29/21 0420 05/30/21 0257 05/31/21 0342 06/01/21 1429 06/02/21 0422 06/03/21 0331  NA 138   < > 135 135 131* 131* 133*  K 4.5   < > 4.0 3.5 3.4* 3.6 3.8  CL 105   < > 100 99 96* 94* 99  CO2 19*   < > 25 28 26 25 24   GLUCOSE 90   < > 109* 107* 119* 85 70  BUN 99*   < > 82* 45* 58* 61* 37*  CREATININE 9.36*   < > 8.61* 5.78* 6.77* 7.17* 5.16*  CALCIUM 8.3*   < > 8.0* 7.9* 7.8* 8.3* 7.8*  MG 2.1  --   --   --   --   --    --   PHOS  --   --   --   --   --  4.8*  --    < > = values in this interval not displayed.     F/u labs ordered Unresulted Labs (From admission, onward)     Start     Ordered   06/02/21 7673  Basic metabolic panel  Daily,   R     Question:  Specimen collection method  Answer:  IV Team=IV Team collect   06/01/21 0803   06/02/21 0500  CBC with Differential/Platelet  Daily,   R     Question:  Specimen collection method  Answer:  IV Team=IV Team collect   06/01/21 0803   05/25/21 0500  Protime-INR  Daily,   R      05/24/21 0816   Signed and Held  Renal function panel  Once,   R       Question:  Specimen collection method  Answer:  Lab=Lab collect   Signed and Held   Signed and Held  CBC  Once,   R       Question:  Specimen collection method  Answer:  Lab=Lab collect   Signed and Held            Signed, Terrilee Croak, MD [facility] 06/03/2021

## 2021-06-03 NOTE — Progress Notes (Signed)
Patient ID: Rees JAYLN MADEIRA, male   DOB: 1941-07-17, 80 y.o.   MRN: [id number] S:Had some N/V this am and is bleeding from [facility] exit. O:BP (!) 152/49 (BP Location: Left Arm)   Pulse 60   Temp 97.9 F (36.6 C) (Oral)   Resp 18   Ht 5\' 9"  (1.753 m)   Wt 83.9 kg   SpO2 98%   BMI 27.31 kg/m   Intake/Output Summary (Last 24 hours) at 06/03/2021 1038 Last data filed at 06/03/2021 0700 Gross per 24 hour  Intake 420 ml  Output 1550 ml  Net -1130 ml   Intake/Output: I/O last 3 completed shifts: In: 480 [P.O.:380; IV Piggyback:100] Out: 6195 [Urine:150; Other:1500]  Intake/Output this shift:  No intake/output data recorded. Weight change: -0.2 kg Gen:NAD CVS: RRR Resp:cta Abd: +BS, soft, NT/ND Ext:1+ ankle edema bilaterally  Recent Labs  Lab 05/28/21 0420 05/29/21 0420 05/30/21 0257 05/31/21 0342 06/01/21 1429 06/02/21 0422 06/03/21 0331  NA 138 138 135 135 131* 131* 133*  K 4.5 4.0 4.0 3.5 3.4* 3.6 3.8  CL 105 102 100 99 96* 94* 99  CO2 19* 23 25 28 26 25 24   GLUCOSE 90 96 109* 107* 119* 85 70  BUN 99* 72* 82* 45* 58* 61* 37*  CREATININE 9.36* 7.89* 8.61* 5.78* 6.77* 7.17* 5.16*  ALBUMIN  --   --   --   --   --  2.2*  --   CALCIUM 8.3* 8.0* 8.0* 7.9* 7.8* 8.3* 7.8*  PHOS  --   --   --   --   --  4.8*  --    Liver Function Tests: Recent Labs  Lab 06/02/21 0422  ALBUMIN 2.2*   No results for input(s): LIPASE, AMYLASE in the last 168 hours. No results for input(s): AMMONIA in the last 168 hours. CBC: Recent Labs  Lab 05/30/21 0257 05/31/21 0342 06/01/21 1429 06/02/21 0422 06/03/21 0331  WBC 13.7* 10.0 11.7* 10.8* 9.3  NEUTROABS 11.1* 7.7 9.5* 8.1* 6.9  HGB 7.2* 6.9* 8.7* 8.2* 8.2*  HCT 22.7* 21.9* 26.4* 25.4* 25.5*  MCV 98.3 98.2 95.3 94.8 96.6  PLT 201 217 247 256 213   Cardiac Enzymes: No results for input(s): CKTOTAL, CKMB, CKMBINDEX, TROPONINI in the last 168 hours. CBG: Recent Labs  Lab 06/02/21 0645 06/02/21 1209 06/02/21 1708 06/02/21 2100  06/03/21 0642  GLUCAP 83 82 89 94 70    Iron Studies: No results for input(s): IRON, TIBC, TRANSFERRIN, FERRITIN in the last 72 hours. Studies/Results: No results found.  sodium chloride   Intravenous Once   amLODipine  5 mg Oral Daily   budesonide  0.25 mg Nebulization BID   carvedilol  3.125 mg Oral BID WC   Chlorhexidine Gluconate Cloth  6 each Topical Daily   feeding supplement (NEPRO CARB STEADY)  237 mL Oral TID BM   ferrous sulfate  325 mg Oral Q breakfast   insulin aspart  0-5 Units Subcutaneous QHS   insulin aspart  0-6 Units Subcutaneous TID WC   ipratropium-albuterol  3 mL Nebulization BID   levothyroxine  112 mcg Oral Q0600   multivitamin with minerals  1 tablet Oral Daily   pantoprazole  40 mg Oral BID   pravastatin  40 mg Oral q1800   Warfarin - Pharmacist Dosing Inpatient   Does not apply q1600    BMET    Component Value Date/Time   NA 133 (L) 06/03/2021 0331   NA 145 (H) 01/06/2021 1031   K  3.8 06/03/2021 0331   CL 99 06/03/2021 0331   CO2 24 06/03/2021 0331   GLUCOSE 70 06/03/2021 0331   BUN 37 (H) 06/03/2021 0331   BUN 27 01/06/2021 1031   CREATININE 5.16 (H) 06/03/2021 0331   CREATININE 1.31 (H) 07/14/2016 0915   CALCIUM 7.8 (L) 06/03/2021 0331   GFRNONAA 11 (L) 06/03/2021 0331   GFRAA 34 (L) 09/09/2020 0930   CBC    Component Value Date/Time   WBC 9.3 06/03/2021 0331   RBC 2.64 (L) 06/03/2021 0331   HGB 8.2 (L) 06/03/2021 0331   HGB 10.5 (L) 09/09/2020 0930   HCT 25.5 (L) 06/03/2021 0331   HCT 31.6 (L) 09/09/2020 0930   PLT 213 06/03/2021 0331   PLT 231 09/09/2020 0930   MCV 96.6 06/03/2021 0331   MCV 97 09/09/2020 0930   MCH 31.1 06/03/2021 0331   MCHC 32.2 06/03/2021 0331   RDW 14.1 06/03/2021 0331   RDW 12.0 09/09/2020 0930   LYMPHSABS 0.7 06/03/2021 0331   LYMPHSABS 1.1 09/09/2020 0930   MONOABS 1.2 (H) 06/03/2021 0331   EOSABS 0.3 06/03/2021 0331   EOSABS 0.3 09/09/2020 0930   BASOSABS 0.1 06/03/2021 0331   BASOSABS 0.1  09/09/2020 0930    Assessment/Plan:   AKI/CKD stage IV, oliguric - presumably due to ischemic ATN due to volume depletion and concomitant use of ARB, aldactone, and jardiance.  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  Started on HD 05/28/21 with temp HD catheter.  No evidence of renal recovery at this time and will try to arrange for outpatient HD as AKI patient.  Have consulted IR for [facility] placement.   Continue on TTS schedule for now and follow for renal recovery.  S/p [facility] placement 06/02/21 Some oozing at exit site, IR to evaluate. No signs of renal recovery, will likely need outpatient HD for AKI. 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 ABLA and anemia of CKD stage IV - transfuse prn.  No ESA due to pelvic mass. A fib - coumadin on hold for [facility] placement.  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 Disposition - PT/OT evaluating.  Has significant weakness.  For CIR.    Donetta Potts, MD [employer] [phone]

## 2021-06-03 NOTE — Progress Notes (Signed)
ANTICOAGULATION CONSULT NOTE - Follow Up Consult  Pharmacy Consult for Warfarin Indication: atrial fibrillation  No Known Allergies  Patient Measurements: Height: 5\' 9"  (175.3 cm) Weight: 83.9 kg (184 lb 15.5 oz) IBW/kg (Calculated) : 70.7  Vital Signs: Temp: 97.9 F (36.6 C) (08/31 0537) Temp Source: Oral (08/31 0537) BP: 142/52 (08/31 0537) Pulse Rate: 59 (08/31 0537)  Labs: Recent Labs    06/01/21 0357 06/01/21 1429 06/01/21 1429 06/02/21 0422 06/03/21 0331  HGB  --  8.7*   < > 8.2* 8.2*  HCT  --  26.4*  --  25.4* 25.5*  PLT  --  247  --  256 213  LABPROT 19.9*  --   --  20.9* 25.0*  INR 1.7*  --   --  1.8* 2.3*  CREATININE  --  6.77*  --  7.17* 5.16*   < > = values in this interval not displayed.     Estimated Creatinine Clearance: 11.2 mL/min (A) (by C-G formula based on SCr of 5.16 mg/dL (H)).   Assessment: 80yo male on warfarin PTA for Afib. Admit INR 2.7 was therapeutic on PTA regimen of warfarin 5mg  Tue and Fri, 2.5 mg all other days (recently changed 8/18).  Per chart review, patient experienced bleeding from the site of temporary dialysis catheter inserted on 8/24 and hematuria on 8/26. Patient received vitamin K 1 mg IV x1 on 8/25. Hemoglobin decreased 7.1 > 6.5, warfarin was then held 8/26 and patient received 1 unit of pRBCs.   HD catheter converted from a temporary to tunneled HD cath on 8/30. MD okayed restart of warfarin post-procedure. INR back up to therapeutic range today (INR 2.3 << 1.8, goal of 2-3). Noted poor po intake which will increase warfarin sensitivity.   CBC wnl, however some oozing noted around the catheter today, IR reviewed. Per discussion with MD/IR to hold warfarin dose this evening.    Goal of Therapy:  INR 2-3 Monitor platelets by anticoagulation protocol: Yes   Plan:  - Hold Warfarin today due to catheter bleeding and INR trend - Daily PT/INR, CBC q72h - Will continue to monitor for any signs/symptoms of bleeding and will  follow up with PT/INR in the a.m.    Thank you for allowing pharmacy to be a part of this patient's care.  Alycia Rossetti, PharmD, BCPS Clinical Pharmacist Clinical phone for 06/03/2021: [phone] 06/03/2021 8:36 AM   **Pharmacist phone directory can now be found on [email or web address] ([email or web address]).  Listed under [place].

## 2021-06-03 NOTE — Progress Notes (Signed)
Occupational Therapy Treatment Patient Details Name: [name] [name] MRN: [id number] DOB: [birth year] Today's Date: 06/03/2021    History of present illness 80 y.o. yo male who presented to the ED for abnormal lab results (sent by his urologist) and for the evaluation of AKI on CKD and hyperkalemia. CT abdomen and pelvis showed mild right hydronephrosis. Pt started HD. PMH including HTN, COPD, CAD s/p CABG, A. fib on anticoagulation, CHF, CKD 4.   OT comments  Patient in bed stating he was tired from dialysis but was willing to get to eob. Patient required assistance to get to eob and to stand to rw.  Min guard to side step up towards [name] [name]. Patient was able to doff socks but required mod assist to donn with legs crossed to reach feet.  Acute OT to follow.   Follow Up Recommendations  CIR    Equipment Recommendations  3 in 1 bedside commode    Recommendations for Other Services      Precautions / Restrictions Precautions Precautions: Fall       Mobility Bed Mobility Overal bed mobility: Needs Assistance Bed Mobility: Supine to Sit;Sit to Supine     Supine to sit: Min assist Sit to supine: Min assist   General bed mobility comments: Difficulty scooting to eob and difficulty getting feet back into bed    Transfers       Sit to Stand: Min assist         General transfer comment: performed standing from eob with min assist to stand after 3 attempts on his own    Balance Overall balance assessment: Needs assistance Sitting-balance support: No upper extremity supported;Feet supported Sitting balance-Leahy Scale: Fair     Standing balance support: Bilateral upper extremity supported Standing balance-Leahy Scale: Poor Standing balance comment: rw used for support                           ADL either performed or assessed with clinical judgement   ADL Overall ADL's : Needs assistance/impaired                     Lower Body Dressing: Moderate  assistance;Sitting/lateral leans Lower Body Dressing Details (indicate cue type and reason): Doffed and donned non-skid socks from eob assistance to thread over feet                     Vision       Perception     Praxis      Cognition Arousal/Alertness: Awake/alert Behavior During Therapy: WFL for tasks assessed/performed Overall Cognitive Status: Within Functional Limits for tasks assessed                                          Exercises     Shoulder Instructions       General Comments      Pertinent Vitals/ Pain       Pain Assessment: No/denies pain Faces Pain Scale: Hurts a little bit Pain Descriptors / Indicators: Grimacing Pain Intervention(s): Repositioned  Home Living                                          Prior Functioning/Environment  Frequency  Min 2X/week        Progress Toward Goals  OT Goals(current goals can now be found in the care plan section)  Progress towards OT goals: Progressing toward goals  Acute Rehab OT Goals Patient Stated Goal: get stronger; be able to walk OT Goal Formulation: With patient Time For Goal Achievement: 06/15/21 Potential to Achieve Goals: Good ADL Goals Pt Will Perform Grooming: with min guard assist;standing Pt Will Perform Lower Body Dressing: with min guard assist;sit to/from stand Pt Will Transfer to Toilet: with min guard assist;bedside commode;ambulating Pt Will Perform Toileting - Clothing Manipulation and hygiene: with min guard assist;sit to/from stand;sitting/lateral leans Additional ADL Goal #1: Pt will independently verbalize three energy conservation techniques for ADLs/IADLs  Plan Discharge plan remains appropriate    Co-evaluation                 AM-PAC OT "6 Clicks" Daily Activity     Outcome Measure   Help from another person eating meals?: None Help from another person taking care of personal grooming?: A Little Help  from another person toileting, which includes using toliet, bedpan, or urinal?: A Little Help from another person bathing (including washing, rinsing, drying)?: A Lot Help from another person to put on and taking off regular upper body clothing?: A Little Help from another person to put on and taking off regular lower body clothing?: A Lot 6 Click Score: 17    End of Session Equipment Utilized During Treatment: Rolling walker;Oxygen  OT Visit Diagnosis: Unsteadiness on feet (R26.81);Other abnormalities of gait and mobility (R26.89);Muscle weakness (generalized) (M62.81)   Activity Tolerance Patient tolerated treatment well;Patient limited by fatigue   Patient Left in bed;with call bell/phone within reach;with family/visitor present   Nurse Communication Mobility status        Time: 0370-4888 OT Time Calculation (min): 24 min  Charges: OT General Charges $OT Visit: 1 Visit OT Treatments $Self Care/Home Management : 23-37 mins  [name] [name], [name] [name] 06/03/2021, 4:00 PM

## 2021-06-03 NOTE — Progress Notes (Signed)
    Called by floor to evaluate Rt IJ tunneled catheter site Bleeding and difficult to stop  RNs had held pressure before I got to room Not bleeding now Dressings were saturated in blood  I removed all dressings No active bleeding No hematoma Tender to palpate New sterile pressure dressing placed  Sat pt upright in bed  ~60 degrees Keep pt upright for several hours if can  Discussed with Dr Pietro Cassis He will hold coumadin at least one dose for now  I reported to him and RN my management of site at this point.  Call if any needs

## 2021-06-04 DIAGNOSIS — N189 Chronic kidney disease, unspecified: Secondary | ICD-10-CM | POA: Diagnosis not present

## 2021-06-04 DIAGNOSIS — N179 Acute kidney failure, unspecified: Secondary | ICD-10-CM | POA: Diagnosis not present

## 2021-06-04 LAB — BASIC METABOLIC PANEL
Anion gap: 13 (ref 5–15)
BUN: 47 mg/dL — ABNORMAL HIGH (ref 8–23)
CO2: 24 mmol/L (ref 22–32)
Calcium: 7.9 mg/dL — ABNORMAL LOW (ref 8.9–10.3)
Chloride: 97 mmol/L — ABNORMAL LOW (ref 98–111)
Creatinine, Ser: 5.97 mg/dL — ABNORMAL HIGH (ref 0.61–1.24)
GFR, Estimated: 9 mL/min — ABNORMAL LOW (ref >60)
Glucose, Bld: 65 mg/dL — ABNORMAL LOW (ref 70–99)
Potassium: 3.8 mmol/L (ref 3.5–5.1)
Sodium: 134 mmol/L — ABNORMAL LOW (ref 135–145)

## 2021-06-04 LAB — CBC WITH DIFFERENTIAL/PLATELET
Abs Immature Granulocytes: 0.06 10*3/uL (ref 0.00–0.07)
Basophils Absolute: 0.1 10*3/uL (ref 0.0–0.1)
Basophils Relative: 1 %
Eosinophils Absolute: 0.4 10*3/uL (ref 0.0–0.5)
Eosinophils Relative: 4 %
HCT: 25.2 % — ABNORMAL LOW (ref 39.0–52.0)
Hemoglobin: 8.1 g/dL — ABNORMAL LOW (ref 13.0–17.0)
Immature Granulocytes: 1 %
Lymphocytes Relative: 7 %
Lymphs Abs: 0.6 10*3/uL — ABNORMAL LOW (ref 0.7–4.0)
MCH: 31 pg (ref 26.0–34.0)
MCHC: 32.1 g/dL (ref 30.0–36.0)
MCV: 96.6 fL (ref 80.0–100.0)
Monocytes Absolute: 1.2 10*3/uL — ABNORMAL HIGH (ref 0.1–1.0)
Monocytes Relative: 13 %
Neutro Abs: 6.5 10*3/uL (ref 1.7–7.7)
Neutrophils Relative %: 74 %
Platelets: 241 10*3/uL (ref 150–400)
RBC: 2.61 MIL/uL — ABNORMAL LOW (ref 4.22–5.81)
RDW: 14.2 % (ref 11.5–15.5)
WBC: 8.8 10*3/uL (ref 4.0–10.5)
nRBC: 0 % (ref 0.0–0.2)

## 2021-06-04 LAB — GLUCOSE, CAPILLARY
Glucose-Capillary: 68 mg/dL — ABNORMAL LOW (ref 70–99)
Glucose-Capillary: 75 mg/dL (ref 70–99)
Glucose-Capillary: 105 mg/dL — ABNORMAL HIGH (ref 70–99)
Glucose-Capillary: 104 mg/dL — ABNORMAL HIGH (ref 70–99)

## 2021-06-04 LAB — MAGNESIUM: Magnesium: 2.1 mg/dL (ref 1.7–2.4)

## 2021-06-04 LAB — PROTIME-INR
INR: 2.9 — ABNORMAL HIGH (ref 0.8–1.2)
Prothrombin Time: 30.7 seconds — ABNORMAL HIGH (ref 11.4–15.2)

## 2021-06-04 MED ORDER — AMLODIPINE BESYLATE 10 MG PO TABS
10.0000 mg | ORAL_TABLET | Freq: Every day | ORAL | Status: DC
  Filled 2021-06-04: qty 1

## 2021-06-04 MED ORDER — HEPARIN SODIUM (PORCINE) 1000 UNIT/ML IJ SOLN
INTRAMUSCULAR | Status: AC
  Filled 2021-06-04: qty 1

## 2021-06-04 NOTE — Progress Notes (Signed)
Patient ID: Martavis LORNE WINKELS, male   DOB: April 01, 1941, 80 y.o.   MRN: [id number] S: Seen and examined while on dialysis.  Reports poor appetite. O:BP (!) 149/49 (BP Location: Left Arm)   Pulse (!) 58   Temp 98 F (36.7 C) (Oral)   Resp 16   Ht 5\' 9"  (1.753 m)   Wt 82.6 kg   SpO2 99%   BMI 26.89 kg/m   Intake/Output Summary (Last 24 hours) at 06/04/2021 0826 Last data filed at 06/04/2021 0617 Gross per 24 hour  Intake 180 ml  Output 225 ml  Net -45 ml   Intake/Output: I/O last 3 completed shifts: In: 300 [P.O.:300] Out: 275 [Urine:275]  Intake/Output this shift:  No intake/output data recorded. Weight change: -1.3 kg Gen:NAD CVS: RRR Resp: CTA Abd: +BS, soft, NT/ND Ext:1+ pedal edema  Recent Labs  Lab 05/29/21 0420 05/30/21 0257 05/31/21 0342 06/01/21 1429 06/02/21 0422 06/03/21 0331 06/04/21 0353  NA 138 135 135 131* 131* 133* 134*  K 4.0 4.0 3.5 3.4* 3.6 3.8 3.8  CL 102 100 99 96* 94* 99 97*  CO2 23 25 28 26 25 24 24   GLUCOSE 96 109* 107* 119* 85 70 65*  BUN 72* 82* 45* 58* 61* 37* 47*  CREATININE 7.89* 8.61* 5.78* 6.77* 7.17* 5.16* 5.97*  ALBUMIN  --   --   --   --  2.2*  --   --   CALCIUM 8.0* 8.0* 7.9* 7.8* 8.3* 7.8* 7.9*  PHOS  --   --   --   --  4.8*  --   --    Liver Function Tests: Recent Labs  Lab 06/02/21 0422  ALBUMIN 2.2*   No results for input(s): LIPASE, AMYLASE in the last 168 hours. No results for input(s): AMMONIA in the last 168 hours. CBC: Recent Labs  Lab 05/31/21 0342 06/01/21 1429 06/02/21 0422 06/03/21 0331 06/04/21 0353  WBC 10.0 11.7* 10.8* 9.3 8.8  NEUTROABS 7.7 9.5* 8.1* 6.9 6.5  HGB 6.9* 8.7* 8.2* 8.2* 8.1*  HCT 21.9* 26.4* 25.4* 25.5* 25.2*  MCV 98.2 95.3 94.8 96.6 96.6  PLT 217 247 256 213 241   Cardiac Enzymes: No results for input(s): CKTOTAL, CKMB, CKMBINDEX, TROPONINI in the last 168 hours. CBG: Recent Labs  Lab 06/03/21 0642 06/03/21 1135 06/03/21 1645 06/03/21 2034 06/04/21 0638  GLUCAP 70 70 78 79 68*     Iron Studies: No results for input(s): IRON, TIBC, TRANSFERRIN, FERRITIN in the last 72 hours. Studies/Results: No results found.  sodium chloride   Intravenous Once   amLODipine  5 mg Oral Daily   budesonide  0.25 mg Nebulization BID   carvedilol  3.125 mg Oral BID WC   Chlorhexidine Gluconate Cloth  6 each Topical Daily   feeding supplement (NEPRO CARB STEADY)  237 mL Oral TID BM   ferrous sulfate  325 mg Oral Q breakfast   insulin aspart  0-5 Units Subcutaneous QHS   insulin aspart  0-6 Units Subcutaneous TID WC   ipratropium-albuterol  3 mL Nebulization BID   levothyroxine  112 mcg Oral Q0600   multivitamin with minerals  1 tablet Oral Daily   pantoprazole  40 mg Oral BID   pravastatin  40 mg Oral q1800   Warfarin - Pharmacist Dosing Inpatient   Does not apply q1600    BMET    Component Value Date/Time   NA 134 (L) 06/04/2021 0353   NA 145 (H) 01/06/2021 1031   K 3.8 06/04/2021  0353   CL 97 (L) 06/04/2021 0353   CO2 24 06/04/2021 0353   GLUCOSE 65 (L) 06/04/2021 0353   BUN 47 (H) 06/04/2021 0353   BUN 27 01/06/2021 1031   CREATININE 5.97 (H) 06/04/2021 0353   CREATININE 1.31 (H) 07/14/2016 0915   CALCIUM 7.9 (L) 06/04/2021 0353   GFRNONAA 9 (L) 06/04/2021 0353   GFRAA 34 (L) 09/09/2020 0930   CBC    Component Value Date/Time   WBC 8.8 06/04/2021 0353   RBC 2.61 (L) 06/04/2021 0353   HGB 8.1 (L) 06/04/2021 0353   HGB 10.5 (L) 09/09/2020 0930   HCT 25.2 (L) 06/04/2021 0353   HCT 31.6 (L) 09/09/2020 0930   PLT 241 06/04/2021 0353   PLT 231 09/09/2020 0930   MCV 96.6 06/04/2021 0353   MCV 97 09/09/2020 0930   MCH 31.0 06/04/2021 0353   MCHC 32.1 06/04/2021 0353   RDW 14.2 06/04/2021 0353   RDW 12.0 09/09/2020 0930   LYMPHSABS 0.6 (L) 06/04/2021 0353   LYMPHSABS 1.1 09/09/2020 0930   MONOABS 1.2 (H) 06/04/2021 0353   EOSABS 0.4 06/04/2021 0353   EOSABS 0.3 09/09/2020 0930   BASOSABS 0.1 06/04/2021 0353   BASOSABS 0.1 09/09/2020 0930     Assessment/Plan:   AKI/CKD stage IV, oliguric - presumably due to ischemic ATN due to volume depletion and concomitant use of ARB, aldactone, and jardiance.  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  Started on HD 05/28/21 with temp HD catheter.  No evidence of renal recovery at this time and will try to arrange for outpatient HD as AKI patient.  Have consulted IR for [facility] placement.   Continue on TTS schedule for now and follow for renal recovery.  S/p [facility] placement 06/02/21 Some oozing at exit site, IR to evaluate. No signs of renal recovery, will likely need outpatient HD for AKI. 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 ABLA and anemia of CKD stage IV - transfuse prn.  No ESA due to pelvic mass. A fib - coumadin on hold for [facility] placement.  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 Disposition - PT/OT evaluating.  Has significant weakness.  For CIR or other SNF.  Donetta Potts, MD [employer] [phone]

## 2021-06-04 NOTE — Progress Notes (Signed)
ANTICOAGULATION CONSULT NOTE - Follow Up Consult  Pharmacy Consult for Warfarin Indication: atrial fibrillation  No Known Allergies  Patient Measurements: Height: 5\' 9"  (175.3 cm) Weight: 82.6 kg (182 lb 1.6 oz) IBW/kg (Calculated) : 70.7  Vital Signs: Temp: 98.5 F (36.9 C) (09/01 0750) Temp Source: Oral (09/01 1107) BP: 176/69 (09/01 1107) Pulse Rate: 61 (09/01 1107)  Labs: Recent Labs    06/02/21 0422 06/03/21 0331 06/04/21 0353  HGB 8.2* 8.2* 8.1*  HCT 25.4* 25.5* 25.2*  PLT 256 213 241  LABPROT 20.9* 25.0* 30.7*  INR 1.8* 2.3* 2.9*  CREATININE 7.17* 5.16* 5.97*     Estimated Creatinine Clearance: 9.7 mL/min (A) (by C-G formula based on SCr of 5.97 mg/dL (H)).   Assessment: 80yo male on warfarin PTA for Afib. Admit INR 2.7 was therapeutic on PTA regimen of warfarin 5mg  Tue and Fri, 2.5 mg all other days (recently changed 8/18).  INR this AM trending up to 2.9 (likely influenced by poor PO intake), held yesterday d/t bleeding from HD cath (previously held for episodes of bleeding from cath and hematuria as well).  H/H 8.1/25.2  Goal of Therapy:  INR 2-3 Monitor platelets by anticoagulation protocol: Yes   Plan:  Will hold warfarin again today with rapid INR increase and consider cautious dosing 9/2 Daily INR, CBC, hematuria, PO intake  Bertis Ruddy, PharmD Clinical Pharmacist Please check AMION for all [place] numbers 06/04/2021 12:52 PM

## 2021-06-04 NOTE — Progress Notes (Signed)
Still awaiting discharge plan. Family requested [employer]. Records sent. Will adjust as needed based on discharge plan.

## 2021-06-04 NOTE — Progress Notes (Signed)
Physical Therapy Treatment Patient Details Name: [name] [name] MRN: [id number] DOB: November 29, 1940 Today's Date: 06/04/2021    History of Present Illness 80 y.o. yo male who presented to the ED for abnormal lab results (sent by his urologist) and for the evaluation of AKI on CKD and hyperkalemia. CT abdomen and pelvis showed mild right hydronephrosis. Pt started HD. PMH including HTN, COPD, CAD s/p CABG, A. fib on anticoagulation, CHF, CKD 4.    PT Comments    Pt reports fatigue prior to session, requiring encouragement to participate, however demonstrates improved tolerance for ambulation this session. Pt requires less assistance for all mobility tasks and demonstrates improvements in LE strength when transferring. PT reinforces the need for multiple bouts of mobility and ambulation each day to improve activity tolerance and mobility quality. Pt will benefit from stair training and continued gait training to aide in a return to his prior level of function.  Follow Up Recommendations  CIR (will benefit from HHPT if denied CIR appeal, pt refusing SNF during this session)     Equipment Recommendations  Rolling walker with 5" wheels;3in1 (PT)    Recommendations for Other Services       Precautions / Restrictions Precautions Precautions: Fall Restrictions Weight Bearing Restrictions: No    Mobility  Bed Mobility Overal bed mobility: Needs Assistance Bed Mobility: Supine to Sit     Supine to sit: Supervision;HOB elevated          Transfers Overall transfer level: Needs assistance Equipment used: Rolling walker (2 wheeled) Transfers: Sit to/from Stand Sit to Stand: Min guard            Ambulation/Gait Ambulation/Gait assistance: Min guard Gait Distance (Feet): 80 Feet Assistive device: Rolling walker (2 wheeled) Gait Pattern/deviations: Step-through pattern Gait velocity: reduced Gait velocity interpretation: <1.8 ft/sec, indicate of risk for recurrent falls General  Gait Details: pt with slowed step-through gait, reduced gait speed   Stairs             Wheelchair Mobility    Modified Rankin (Stroke Patients Only)       Balance Overall balance assessment: Needs assistance Sitting-balance support: No upper extremity supported;Feet supported Sitting balance-Leahy Scale: Good     Standing balance support: Bilateral upper extremity supported Standing balance-Leahy Scale: Poor Standing balance comment: reliant on UE support of RW                            Cognition Arousal/Alertness: Awake/alert Behavior During Therapy: WFL for tasks assessed/performed Overall Cognitive Status: Within Functional Limits for tasks assessed                                        Exercises      General Comments General comments (skin integrity, edema, etc.): pt on 3L [place] upon PT arrival, PT weans to room air, pt desat to 87% on room air when ambulating. Pt requests return to 2.5L [place] which he wears at baseline.      Pertinent Vitals/Pain Pain Assessment: Faces Faces Pain Scale: Hurts little more Pain Location: back Pain Descriptors / Indicators: Aching Pain Intervention(s): Monitored during session    Home Living                      Prior Function            PT  Goals (current goals can now be found in the care plan section) Acute Rehab PT Goals Patient Stated Goal: get stronger; return to prior level of function Progress towards PT goals: Progressing toward goals    Frequency    Min 3X/week      PT Plan Current plan remains appropriate    Co-evaluation              AM-PAC PT "6 Clicks" Mobility   Outcome Measure  Help needed turning from your back to your side while in a flat bed without using bedrails?: None Help needed moving from lying on your back to sitting on the side of a flat bed without using bedrails?: A Little Help needed moving to and from a bed to a chair (including a  wheelchair)?: A Little Help needed standing up from a chair using your arms (e.g., wheelchair or bedside chair)?: A Little Help needed to walk in hospital room?: A Little Help needed climbing 3-5 steps with a railing? : A Lot 6 Click Score: 18    End of Session Equipment Utilized During Treatment: Oxygen Activity Tolerance: Patient tolerated treatment well Patient left: in chair;with call bell/phone within reach;with chair alarm set;with family/visitor present Nurse Communication: Mobility status PT Visit Diagnosis: Unsteadiness on feet (R26.81);Muscle weakness (generalized) (M62.81)     Time: 3300-7622 PT Time Calculation (min) (ACUTE ONLY): 31 min  Charges:  $Gait Training: 8-22 mins $Therapeutic Activity: 8-22 mins                     Zenaida Niece, PT, DPT Acute Rehabilitation Pager: [phone]    Zenaida Niece 06/04/2021, 4:34 PM

## 2021-06-04 NOTE — Progress Notes (Signed)
PROGRESS NOTE  [name] [name]  DOB: 29-Nov-1940  PCP: Claretta Fraise, MD TDD:[id number]  DOA: 05/22/2021  LOS: 12 days  Hospital Day: 55  Chief complaint: abnormal labs  Brief narrative: [name] [name] is a 80 y.o. male with PMH significant for CAD s/p CABG, atrial fibrillation, CHF, AICD, COPD, CKD stage IIIb. Patient presented to the ED on 8/19 from his PCPs office for abnormal labs He had elevated potassium and creatinine. CT abdomen and pelvis showed mild right hydronephrosis. Admitted to hospitalist service.   Nephrology and urology were consulted.   See below for details  Subjective: Patient was seen and examined this morning in dialysis. Lying in bed.  Not in distress.   No bleeding noted from the dialysis access site today.  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 -Creatinine did not improve with conservative measures. -8/24, patient underwent triple-lumen catheter placement and started on dialysis. -8/30, tunneled dialysis catheter placed.  Bleeding from the dialysis catheter site -During this hospitalization, patient has had episodes of bleeding from dialysis catheter access site.  Patient has had hematuria as well.  Coumadin was held.Marland Kitchen  Pharmacy following.  Target INR 2-3. Recent Labs  Lab 05/31/21 0342 06/01/21 0357 06/02/21 0422 06/03/21 0331 06/04/21 0353  INR 1.8* 1.7* 1.8* 2.3* 2.9*   Hematuria -Intermittently passing blood in urine.   -Previously, CT renal study from 8/10 had shown mild hydronephrosis on the right with gradual tapering of the proximal right ureter without a visible cause of hydronephrosis.   -Urology consult obtained.  Foley catheter removed on 8/29. -Patient has minimal blood-tinged urine on external urinary catheter. -Urology recommended  outpatient work-up.  Acute on chronic anemia -Hemoglobin is currently stable between 8 to 9.  Total 3 units of PRBCs transfused this hospitalization.  -Repeat hemoglobin tomorrow. Recent Labs    10/23/20 1200 05/13/21 1325 05/24/21 0121 05/25/21 0729 05/31/21 0342 06/01/21 1429 06/02/21 0422 06/03/21 0331 06/04/21 0353  HGB  --    < > 7.0*   < > 6.9* 8.7* 8.2* 8.2* 8.1*  MCV  --    < > 94.0   < > 98.2 95.3 94.8 96.6 96.6  VITAMINB12 464  --   --   --   --   --   --   --   --   FERRITIN  --   --  129  --   --   --   --   --   --   TIBC  --   --  189*  --   --   --   --   --   --   IRON  --   --  44*  --   --   --   --   --   --    < > = values in this interval not displayed.  Hypoglycemia -No history of diabetes mellitus.  A1c 5.4 on 8/20. -Currently on antidiabetic regimen. -Blood sugar level low at 68 this morning.  Encourage oral appetite. Recent Labs  Lab 06/03/21 1135 06/03/21 1645 06/03/21 2034 06/04/21 0638 06/04/21 1135  GLUCAP 70 78 79 68* 75   History of COPD/asthma -Respiratory status stable at this time. -Continue DuoNeb, Pulmicort and albuterol  CHF Essential hypertension -Currently on Coreg and amlodipine.  Blood pressure mostly elevated to 160s and 170s.  I will increase  amlodipine from 5 mg to 10 mg daily.   -Continue to hold hydralazine, Entresto and spironolactone.  A. Fib -Heart rate controlled on Coreg.  Plan for Coumadin as above.  CAD s/p AICD: -Continue Coreg and statin. Last device check was 04/02/21.  Carotid artery stenosis -Follows with vascular surgery.   Hyperkalemia/hyperphosphatemia -Corrected after dialysis was initiated.  Moderate protein calorie malnutrition Hypoalbuminemia -Continue protein supplements.  Hypothyroidism -Continue Synthroid   GERD -Continue Protonix    Mobility: Encourage ambulation.  PT eval appreciated.   Code Status:   Code Status: Full Code  Nutritional status: Body mass index is 26.89  kg/m. Nutrition Problem: Increased nutrient needs Etiology: acute illness (AKI) Signs/Symptoms: estimated needs Diet:  Diet Order             Diet renal with fluid restriction Fluid restriction: 1200 mL Fluid; Room service appropriate? Yes; Fluid consistency: Thin  Diet effective now                  DVT prophylaxis:  SCDs Start: 05/23/21 8185   Antimicrobials: None Fluid: None Consultants: Nephrology Family Communication: Discussed with wife in patient's room. Status is: Inpatient  Remains inpatient appropriate because: Pending appeal for CIR placement. Dispo: The patient is from: Home              Anticipated d/c is to: Likely SNF.              Patient currently is not medically stable to d/c.   Difficult to place patient No    Infusions:      Scheduled Meds:  sodium chloride   Intravenous Once   [START ON 06/05/2021] amLODipine  10 mg Oral Daily   budesonide  0.25 mg Nebulization BID   carvedilol  3.125 mg Oral BID WC   Chlorhexidine Gluconate Cloth  6 each Topical Daily   feeding supplement (NEPRO CARB STEADY)  237 mL Oral TID BM   ferrous sulfate  325 mg Oral Q breakfast   heparin sodium (porcine)       insulin aspart  0-5 Units Subcutaneous QHS   insulin aspart  0-6 Units Subcutaneous TID WC   ipratropium-albuterol  3 mL Nebulization BID   levothyroxine  112 mcg Oral Q0600   multivitamin with minerals  1 tablet Oral Daily   pantoprazole  40 mg Oral BID   pravastatin  40 mg Oral q1800   Warfarin - Pharmacist Dosing Inpatient   Does not apply q1600    Antimicrobials: Anti-infectives (From admission, onward)    Start     Dose/Rate Route Frequency Ordered Stop   06/02/21 0000  ceFAZolin (ANCEF) IVPB 2g/100 mL premix        2 g 200 mL/hr over 30 Minutes Intravenous To Radiology 06/01/21 1415 06/02/21 1617       PRN meds: acetaminophen, albuterol, dextromethorphan-guaiFENesin, ondansetron (ZOFRAN) IV, senna-docusate, sodium chloride flush, traZODone    Objective: Vitals:   06/04/21 1030 06/04/21 1107  BP: (!) 169/72 (!) 176/69  Pulse: 61 61  Resp:  11  Temp:    SpO2:  100%    Intake/Output Summary (Last 24 hours) at 06/04/2021 1252 Last data filed at 06/04/2021 1055 Gross per 24 hour  Intake 180 ml  Output 1225 ml  Net -1045 ml   Filed Weights   06/02/21 1111 06/02/21 2100 06/04/21 0431  Weight: 82.9 kg 83.9 kg 82.6 kg   Weight change: -1.3 kg Body mass index is 26.89 kg/m.   Physical Exam: General  exam: Pleasant, elderly Caucasian male.  Not in distress.  On 2 L oxygen by nasal cannula.  External catheter without blood  skin: No rashes, lesions or ulcers. HEENT: Atraumatic, normocephalic, no obvious bleeding Lungs: Diminished bilateral lower lobe air entry, otherwise clear to auscultation bilaterally. Chest wall: Right anterior chest wall with tunneled catheter insertion site.  No bleeding today. CVS: Regular rate and rhythm, no murmur GI/Abd soft, nontender, nondistended, bowel sound present CNS: Alert, awake, slow to respond, oriented to place and person Psychiatry: Depressed look Extremities: No pedal edema, no calf tenderness  Data Review: I have personally reviewed the laboratory data and studies available.  Recent Labs  Lab 05/31/21 0342 06/01/21 1429 06/02/21 0422 06/03/21 0331 06/04/21 0353  WBC 10.0 11.7* 10.8* 9.3 8.8  NEUTROABS 7.7 9.5* 8.1* 6.9 6.5  HGB 6.9* 8.7* 8.2* 8.2* 8.1*  HCT 21.9* 26.4* 25.4* 25.5* 25.2*  MCV 98.2 95.3 94.8 96.6 96.6  PLT 217 247 256 213 241   Recent Labs  Lab 05/31/21 0342 06/01/21 1429 06/02/21 0422 06/03/21 0331 06/04/21 0353  NA 135 131* 131* 133* 134*  K 3.5 3.4* 3.6 3.8 3.8  CL 99 96* 94* 99 97*  CO2 28 26 25 24 24   GLUCOSE 107* 119* 85 70 65*  BUN 45* 58* 61* 37* 47*  CREATININE 5.78* 6.77* 7.17* 5.16* 5.97*  CALCIUM 7.9* 7.8* 8.3* 7.8* 7.9*  MG  --   --   --   --  2.1  PHOS  --   --  4.8*  --   --     F/u labs ordered Unresulted Labs (From  admission, onward)     Start     Ordered   05/25/21 0500  Protime-INR  Daily,   R      05/24/21 0816   Signed and Held  Renal function panel  Once,   R       Question:  Specimen collection method  Answer:  Lab=Lab collect   Signed and Held   Signed and Held  CBC  Once,   R       Question:  Specimen collection method  Answer:  Lab=Lab collect   Signed and Held            Signed, Terrilee Croak, MD [facility] 06/04/2021

## 2021-06-05 ENCOUNTER — Encounter (HOSPITAL_COMMUNITY): Payer: Self-pay | Admitting: Physical Medicine and Rehabilitation

## 2021-06-05 ENCOUNTER — Other Ambulatory Visit: Payer: Self-pay

## 2021-06-05 ENCOUNTER — Inpatient Hospital Stay (HOSPITAL_COMMUNITY)
Admission: RE | Admit: 2021-06-05 | Discharge: 2021-06-13 | DRG: 945 | Disposition: A | Discharge status: Home Health | Payer: Medicare Other | Source: Intra-hospital | Attending: Physical Medicine and Rehabilitation | Admitting: Physical Medicine and Rehabilitation

## 2021-06-05 DIAGNOSIS — Z992 Dependence on renal dialysis: Secondary | ICD-10-CM

## 2021-06-05 DIAGNOSIS — I10 Essential (primary) hypertension: Secondary | ICD-10-CM | POA: Diagnosis not present

## 2021-06-05 DIAGNOSIS — N189 Chronic kidney disease, unspecified: Secondary | ICD-10-CM | POA: Diagnosis not present

## 2021-06-05 DIAGNOSIS — M109 Gout, unspecified: Secondary | ICD-10-CM | POA: Diagnosis present

## 2021-06-05 DIAGNOSIS — Z95810 Presence of automatic (implantable) cardiac defibrillator: Secondary | ICD-10-CM | POA: Diagnosis not present

## 2021-06-05 DIAGNOSIS — D631 Anemia in chronic kidney disease: Secondary | ICD-10-CM | POA: Diagnosis present

## 2021-06-05 DIAGNOSIS — E875 Hyperkalemia: Secondary | ICD-10-CM | POA: Diagnosis not present

## 2021-06-05 DIAGNOSIS — E663 Overweight: Secondary | ICD-10-CM | POA: Diagnosis present

## 2021-06-05 DIAGNOSIS — J449 Chronic obstructive pulmonary disease, unspecified: Secondary | ICD-10-CM | POA: Diagnosis not present

## 2021-06-05 DIAGNOSIS — I509 Heart failure, unspecified: Secondary | ICD-10-CM | POA: Diagnosis not present

## 2021-06-05 DIAGNOSIS — E869 Volume depletion, unspecified: Secondary | ICD-10-CM | POA: Diagnosis present

## 2021-06-05 DIAGNOSIS — I5042 Chronic combined systolic (congestive) and diastolic (congestive) heart failure: Secondary | ICD-10-CM | POA: Diagnosis not present

## 2021-06-05 DIAGNOSIS — I4891 Unspecified atrial fibrillation: Secondary | ICD-10-CM | POA: Diagnosis present

## 2021-06-05 DIAGNOSIS — Z951 Presence of aortocoronary bypass graft: Secondary | ICD-10-CM

## 2021-06-05 DIAGNOSIS — Z79899 Other long term (current) drug therapy: Secondary | ICD-10-CM

## 2021-06-05 DIAGNOSIS — E785 Hyperlipidemia, unspecified: Secondary | ICD-10-CM | POA: Diagnosis present

## 2021-06-05 DIAGNOSIS — I5021 Acute systolic (congestive) heart failure: Secondary | ICD-10-CM | POA: Diagnosis not present

## 2021-06-05 DIAGNOSIS — N170 Acute kidney failure with tubular necrosis: Secondary | ICD-10-CM | POA: Diagnosis not present

## 2021-06-05 DIAGNOSIS — I2510 Atherosclerotic heart disease of native coronary artery without angina pectoris: Secondary | ICD-10-CM | POA: Diagnosis not present

## 2021-06-05 DIAGNOSIS — Z8249 Family history of ischemic heart disease and other diseases of the circulatory system: Secondary | ICD-10-CM | POA: Diagnosis not present

## 2021-06-05 DIAGNOSIS — R0602 Shortness of breath: Secondary | ICD-10-CM

## 2021-06-05 DIAGNOSIS — Z9981 Dependence on supplemental oxygen: Secondary | ICD-10-CM

## 2021-06-05 DIAGNOSIS — R5381 Other malaise: Principal | ICD-10-CM | POA: Diagnosis present

## 2021-06-05 DIAGNOSIS — I517 Cardiomegaly: Secondary | ICD-10-CM | POA: Diagnosis not present

## 2021-06-05 DIAGNOSIS — E038 Other specified hypothyroidism: Secondary | ICD-10-CM

## 2021-06-05 DIAGNOSIS — D62 Acute posthemorrhagic anemia: Secondary | ICD-10-CM | POA: Diagnosis present

## 2021-06-05 DIAGNOSIS — I130 Hypertensive heart and chronic kidney disease with heart failure and stage 1 through stage 4 chronic kidney disease, or unspecified chronic kidney disease: Secondary | ICD-10-CM | POA: Diagnosis not present

## 2021-06-05 DIAGNOSIS — Z7984 Long term (current) use of oral hypoglycemic drugs: Secondary | ICD-10-CM

## 2021-06-05 DIAGNOSIS — R319 Hematuria, unspecified: Secondary | ICD-10-CM | POA: Diagnosis not present

## 2021-06-05 DIAGNOSIS — Z7901 Long term (current) use of anticoagulants: Secondary | ICD-10-CM | POA: Diagnosis not present

## 2021-06-05 DIAGNOSIS — I132 Hypertensive heart and chronic kidney disease with heart failure and with stage 5 chronic kidney disease, or end stage renal disease: Secondary | ICD-10-CM | POA: Diagnosis not present

## 2021-06-05 DIAGNOSIS — Z7951 Long term (current) use of inhaled steroids: Secondary | ICD-10-CM

## 2021-06-05 DIAGNOSIS — N179 Acute kidney failure, unspecified: Secondary | ICD-10-CM | POA: Diagnosis not present

## 2021-06-05 DIAGNOSIS — I5043 Acute on chronic combined systolic (congestive) and diastolic (congestive) heart failure: Secondary | ICD-10-CM | POA: Diagnosis not present

## 2021-06-05 DIAGNOSIS — E039 Hypothyroidism, unspecified: Secondary | ICD-10-CM | POA: Diagnosis not present

## 2021-06-05 DIAGNOSIS — I4821 Permanent atrial fibrillation: Secondary | ICD-10-CM | POA: Diagnosis not present

## 2021-06-05 DIAGNOSIS — Z800 Family history of malignant neoplasm of digestive organs: Secondary | ICD-10-CM | POA: Diagnosis not present

## 2021-06-05 DIAGNOSIS — M160 Bilateral primary osteoarthritis of hip: Secondary | ICD-10-CM | POA: Diagnosis not present

## 2021-06-05 DIAGNOSIS — K219 Gastro-esophageal reflux disease without esophagitis: Secondary | ICD-10-CM | POA: Diagnosis not present

## 2021-06-05 DIAGNOSIS — I5022 Chronic systolic (congestive) heart failure: Secondary | ICD-10-CM | POA: Diagnosis not present

## 2021-06-05 DIAGNOSIS — N186 End stage renal disease: Secondary | ICD-10-CM | POA: Diagnosis present

## 2021-06-05 DIAGNOSIS — Z87891 Personal history of nicotine dependence: Secondary | ICD-10-CM

## 2021-06-05 DIAGNOSIS — J90 Pleural effusion, not elsewhere classified: Secondary | ICD-10-CM | POA: Diagnosis not present

## 2021-06-05 DIAGNOSIS — N184 Chronic kidney disease, stage 4 (severe): Secondary | ICD-10-CM | POA: Diagnosis not present

## 2021-06-05 DIAGNOSIS — Z841 Family history of disorders of kidney and ureter: Secondary | ICD-10-CM | POA: Diagnosis not present

## 2021-06-05 DIAGNOSIS — R001 Bradycardia, unspecified: Secondary | ICD-10-CM | POA: Diagnosis not present

## 2021-06-05 DIAGNOSIS — R310 Gross hematuria: Secondary | ICD-10-CM | POA: Diagnosis not present

## 2021-06-05 DIAGNOSIS — Z79890 Hormone replacement therapy (postmenopausal): Secondary | ICD-10-CM

## 2021-06-05 DIAGNOSIS — Z806 Family history of leukemia: Secondary | ICD-10-CM | POA: Diagnosis not present

## 2021-06-05 DIAGNOSIS — D649 Anemia, unspecified: Secondary | ICD-10-CM | POA: Diagnosis not present

## 2021-06-05 DIAGNOSIS — Z6827 Body mass index (BMI) 27.0-27.9, adult: Secondary | ICD-10-CM

## 2021-06-05 LAB — RENAL FUNCTION PANEL
Albumin: 2.3 g/dL — ABNORMAL LOW (ref 3.5–5.0)
Anion gap: 8 (ref 5–15)
BUN: 27 mg/dL — ABNORMAL HIGH (ref 8–23)
CO2: 27 mmol/L (ref 22–32)
Calcium: 8 mg/dL — ABNORMAL LOW (ref 8.9–10.3)
Chloride: 98 mmol/L (ref 98–111)
Creatinine, Ser: 4.41 mg/dL — ABNORMAL HIGH (ref 0.61–1.24)
GFR, Estimated: 13 mL/min — ABNORMAL LOW (ref >60)
Glucose, Bld: 98 mg/dL (ref 70–99)
Phosphorus: 2.8 mg/dL (ref 2.5–4.6)
Potassium: 3.8 mmol/L (ref 3.5–5.1)
Sodium: 133 mmol/L — ABNORMAL LOW (ref 135–145)

## 2021-06-05 LAB — CBC
HCT: 26.8 % — ABNORMAL LOW (ref 39.0–52.0)
Hemoglobin: 8.7 g/dL — ABNORMAL LOW (ref 13.0–17.0)
MCH: 31.1 pg (ref 26.0–34.0)
MCHC: 32.5 g/dL (ref 30.0–36.0)
MCV: 95.7 fL (ref 80.0–100.0)
Platelets: 237 10*3/uL (ref 150–400)
RBC: 2.8 MIL/uL — ABNORMAL LOW (ref 4.22–5.81)
RDW: 13.9 % (ref 11.5–15.5)
WBC: 9 10*3/uL (ref 4.0–10.5)
nRBC: 0 % (ref 0.0–0.2)

## 2021-06-05 LAB — GLUCOSE, CAPILLARY
Glucose-Capillary: 99 mg/dL (ref 70–99)
Glucose-Capillary: 87 mg/dL (ref 70–99)
Glucose-Capillary: 94 mg/dL (ref 70–99)
Glucose-Capillary: 89 mg/dL (ref 70–99)

## 2021-06-05 LAB — PROTIME-INR
INR: 2.6 — ABNORMAL HIGH (ref 0.8–1.2)
Prothrombin Time: 27.6 seconds — ABNORMAL HIGH (ref 11.4–15.2)

## 2021-06-05 MED ORDER — DM-GUAIFENESIN ER 30-600 MG PO TB12: 1.0000 | ORAL_TABLET | Freq: Two times a day (BID) | ORAL | Status: DC | PRN

## 2021-06-05 MED ORDER — AMLODIPINE BESYLATE 10 MG PO TABS: 10.0000 mg | ORAL_TABLET | Freq: Every day | ORAL | Status: DC

## 2021-06-05 MED ORDER — IPRATROPIUM-ALBUTEROL 0.5-2.5 (3) MG/3ML IN SOLN
3.0000 mL | Freq: Two times a day (BID) | RESPIRATORY_TRACT | Status: DC
  Administered 2021-06-06 – 2021-06-13 (×14): 3 mL via RESPIRATORY_TRACT
  Filled 2021-06-05: qty 3
  Filled 2021-06-05: qty 9
  Filled 2021-06-05 (×14): qty 3

## 2021-06-05 MED ORDER — PRAVASTATIN SODIUM 40 MG PO TABS
40.0000 mg | ORAL_TABLET | Freq: Every day | ORAL | Status: DC
  Administered 2021-06-05 – 2021-06-12 (×7): 40 mg via ORAL
  Filled 2021-06-05 (×7): qty 1

## 2021-06-05 MED ORDER — CARVEDILOL 3.125 MG PO TABS: 3.1250 mg | ORAL_TABLET | Freq: Two times a day (BID) | ORAL | Status: DC

## 2021-06-05 MED ORDER — BUDESONIDE 0.25 MG/2ML IN SUSP
0.2500 mg | Freq: Two times a day (BID) | RESPIRATORY_TRACT | Status: DC
  Administered 2021-06-06 – 2021-06-13 (×14): 0.25 mg via RESPIRATORY_TRACT
  Filled 2021-06-05 (×18): qty 2

## 2021-06-05 MED ORDER — LEVOTHYROXINE SODIUM 112 MCG PO TABS
112.0000 ug | ORAL_TABLET | Freq: Every day | ORAL | Status: DC
  Administered 2021-06-06 – 2021-06-13 (×8): 112 ug via ORAL
  Filled 2021-06-05 (×8): qty 1

## 2021-06-05 MED ORDER — AMLODIPINE BESYLATE 10 MG PO TABS
10.0000 mg | ORAL_TABLET | Freq: Every day | ORAL | Status: DC
  Administered 2021-06-06 – 2021-06-13 (×8): 10 mg via ORAL
  Filled 2021-06-05 (×8): qty 1

## 2021-06-05 MED ORDER — SENNOSIDES-DOCUSATE SODIUM 8.6-50 MG PO TABS: 1.0000 | ORAL_TABLET | Freq: Every evening | ORAL | Status: DC | PRN

## 2021-06-05 MED ORDER — WARFARIN SODIUM 2.5 MG PO TABS: 2.5000 mg | ORAL_TABLET | Freq: Once | ORAL | Status: DC

## 2021-06-05 MED ORDER — WARFARIN SODIUM 2.5 MG PO TABS
2.5000 mg | ORAL_TABLET | Freq: Once | ORAL | Status: AC
  Filled 2021-06-05: qty 1

## 2021-06-05 MED ORDER — CARVEDILOL 3.125 MG PO TABS
3.1250 mg | ORAL_TABLET | Freq: Two times a day (BID) | ORAL | Status: DC
  Administered 2021-06-05 – 2021-06-07 (×4): 3.125 mg via ORAL
  Filled 2021-06-05 (×4): qty 1

## 2021-06-05 MED ORDER — ADULT MULTIVITAMIN W/MINERALS CH
1.0000 | ORAL_TABLET | Freq: Every day | ORAL | Status: DC
  Administered 2021-06-06: 1 via ORAL
  Filled 2021-06-05: qty 1

## 2021-06-05 MED ORDER — SENNOSIDES-DOCUSATE SODIUM 8.6-50 MG PO TABS
1.0000 | ORAL_TABLET | Freq: Every evening | ORAL | Status: DC | PRN
  Administered 2021-06-07 – 2021-06-11 (×2): 1 via ORAL
  Filled 2021-06-05 (×2): qty 1

## 2021-06-05 MED ORDER — ALBUTEROL SULFATE (2.5 MG/3ML) 0.083% IN NEBU
2.5000 mg | INHALATION_SOLUTION | Freq: Four times a day (QID) | RESPIRATORY_TRACT | Status: DC | PRN
  Administered 2021-06-05 – 2021-06-08 (×4): 2.5 mg via RESPIRATORY_TRACT
  Filled 2021-06-05 (×4): qty 3

## 2021-06-05 MED ORDER — PANTOPRAZOLE SODIUM 40 MG PO TBEC
40.0000 mg | DELAYED_RELEASE_TABLET | Freq: Two times a day (BID) | ORAL | Status: DC
  Administered 2021-06-05 – 2021-06-13 (×16): 40 mg via ORAL
  Filled 2021-06-05 (×16): qty 1

## 2021-06-05 MED ORDER — NEPRO/CARBSTEADY PO LIQD
237.0000 mL | Freq: Three times a day (TID) | ORAL | Status: DC
  Administered 2021-06-05 – 2021-06-13 (×10): 237 mL via ORAL

## 2021-06-05 MED ORDER — WARFARIN SODIUM 2.5 MG PO TABS: 2.5000 mg | ORAL_TABLET | Freq: Every day | ORAL | Status: DC

## 2021-06-05 MED ORDER — WARFARIN - PHARMACIST DOSING INPATIENT: Freq: Every day | Status: DC

## 2021-06-05 MED ORDER — TRAZODONE HCL 50 MG PO TABS: 50.0000 mg | ORAL_TABLET | Freq: Every evening | ORAL | Status: DC | PRN

## 2021-06-05 MED ORDER — FERROUS SULFATE 325 (65 FE) MG PO TABS
325.0000 mg | ORAL_TABLET | Freq: Every day | ORAL | Status: DC
  Administered 2021-06-06 – 2021-06-08 (×3): 325 mg via ORAL
  Filled 2021-06-05 (×3): qty 1

## 2021-06-05 MED ORDER — ACETAMINOPHEN 325 MG PO TABS
650.0000 mg | ORAL_TABLET | Freq: Four times a day (QID) | ORAL | Status: DC | PRN
  Administered 2021-06-08 – 2021-06-09 (×4): 650 mg via ORAL
  Filled 2021-06-05 (×5): qty 2

## 2021-06-05 NOTE — Plan of Care (Signed)
  Problem: Clinical Measurements: Goal: Ability to maintain clinical measurements within normal limits will improve Outcome: Progressing Goal: Respiratory complications will improve Outcome: Progressing   Problem: Activity: Goal: Risk for activity intolerance will decrease Outcome: Progressing   Problem: Safety: Goal: Ability to remain free from injury will improve Outcome: Progressing   Problem: Activity: Goal: Activity intolerance will improve Outcome: Progressing

## 2021-06-05 NOTE — H&P (Signed)
Chief complaint: Weakness   Physical Medicine and Rehabilitation Admission H&P     HPI: [name] [name] is an 80 year old right-handed male with history of CAD/CABG, atrial fibrillation maintained on Coumadin, diastolic congestive heart failure, AICD, COPD with remote tobacco abuse with supplemental oxygen at night, CKD stage IV.  History taken from chart review and patient.  Patient lives with wife.  1 level home 3 steps to entry.  Reportedly independent prior to admission.  He presented on 05/22/2021 with recent UTI and progressive weakness and fatigue.  Denied any chest pain or shortness of breath.  In the ED blood pressure noted to be 181/59, BUN 91, creatinine 10.49 from a recent baseline 2.52, hemoglobin 7.6.  Renal ultrasound showed some left renal atrophy.  Lower pole right renal cyst or minimally complex cyst.  No hydronephrosis.  Renal service consulted for severe AKI on CKD with hemodialysis initiated currently on a Tuesday Thursday Saturday schedule and latest creatinine 5.97.  IR consulted for [facility] placement.  His chronic Coumadin has been resumed.  Therapy evaluations completed due to patient decreased functional mobility was admitted for a comprehensive rehab program.  Please see preadmission assessment from earlier today as well.  Review of Systems  Constitutional:  Positive for malaise/fatigue. Negative for chills and fever.  HENT:  Negative for hearing loss.   Eyes:  Negative for blurred vision and double vision.  Respiratory:  Negative for cough and shortness of breath.   Cardiovascular:  Negative for chest pain.  Gastrointestinal:  Positive for constipation. Negative for heartburn, nausea and vomiting.  Genitourinary:  Negative for dysuria, flank pain and hematuria.  Musculoskeletal:  Positive for joint pain and myalgias.  Skin:  Negative for rash.  Neurological:  Positive for focal weakness and weakness.  All other systems reviewed and are negative. Past Medical History:   Diagnosis Date   AICD (automatic cardioverter/defibrillator) present 04/05/2017   Anemia    Arthritis    "hips; back" (07/09/2014)   Asthma    Atrial fibrillation (HCC)    CAD (coronary artery disease)    CHF (congestive heart failure) ([place])    Cholecystitis 11/2013   CKD (chronic kidney disease) 2015   Stage  3.    COPD (chronic obstructive pulmonary disease) (HCC)    on home oxygen, 2 liters at night10/2015   Esophageal reflux    Gallstones    Gastric antral vascular ectasia 2013   Gout    Heme positive stool    Hepatomegaly    Hiatal hernia    History of blood transfusion ~ 2012   "blood count dropped; had to get 3 units"   Hyperlipidemia    Hypothyroidism    Other and unspecified coagulation defects    Personal history of colonic polyps 05/29/2010   TUBULAR ADENOMA   Unspecified essential hypertension    Past Surgical History:  Procedure Laterality Date   CARDIAC CATHETERIZATION N/A 07/15/2016   Procedure: Right/Left Heart Cath and Coronary/Graft Angiography;  Surgeon: Belva Crome, MD;  Location: [facility];  Service: Cardiovascular;  Laterality: N/A;   cholecystomy tube  12/28/2014 - 01/06/2015   tube clogged with debris, removed and IR unable to place new tube.    COLONOSCOPY  2013   Dr. Sharlett Iles: no polyps or evidence of active bleeding   CORONARY ANGIOPLASTY WITH STENT PLACEMENT  ~ 2008; 07/08/2014   "1 + 3"   [place]   CABG X4   ESOPHAGOGASTRODUODENOSCOPY  2013  Dr. 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 Sharlett Iles: AVM at 77 and blood at 30 minutes beyond first duodenal image but not actual lesion seen. If persistent IDA, bleeding, recommend enteroscopy with ablation     HERNIA REPAIR     ICD IMPLANT N/A 04/05/2017   Procedure: ICD Implant;  Surgeon: Evans Lance, MD;  Location: [place] [facility];  Service: Cardiovascular;  Laterality: N/A;   IR FLUORO GUIDE CV LINE RIGHT  05/27/2021   IR FLUORO GUIDE CV LINE RIGHT  06/02/2021   IR US GUIDE VASC ACCESS RIGHT  05/27/2021   LEFT AND RIGHT HEART CATHETERIZATION WITH CORONARY/GRAFT ANGIOGRAM N/A 07/09/2014   Right and left heart cath, bare metal stent to SVG to RCA. Sinclair Grooms, MD;    UMBILICAL HERNIA REPAIR  [phone]   Family History  Problem Relation Age of Onset   Leukemia Mother    Kidney disease Mother        kidney removed    Heart attack Brother    Heart disease Brother    Heart disease Father    Stomach cancer Sister    Stomach cancer Sister    Early death Brother    Hypertension Other        family    Colon cancer Neg Hx    Social History:  reports that he quit smoking about 24 years ago. His smoking use included cigarettes. He has a 42.00 pack-year smoking history. He has never used smokeless tobacco. He reports current alcohol use of about 1.0 standard drink per week. He reports that he does not use drugs. Allergies: No Known Allergies Medications Prior to Admission  Medication Sig Dispense Refill   acetaminophen (TYLENOL) 500 MG tablet Take 500 mg by mouth every 6 (six) hours as needed for headache or mild pain.     albuterol (VENTOLIN HFA) 108 (90 Base) MCG/ACT inhaler USE 2 INHALATIONS BY MOUTH  EVERY 6 HOURS AS NEEDED FOR SHORTNESS OF BREATH (Patient taking differently: Inhale 2 puffs into the lungs every 6 (six) hours as needed for shortness of breath.) 34 g 0   budesonide (PULMICORT) 0.25 MG/2ML nebulizer solution USE 1 VIAL  IN  NEBULIZER TWICE  DAILY - rinse mouth after treatment (Patient taking differently: Take 0.25 mg by nebulization See admin instructions. USE 1 VIAL  IN  NEBULIZER TWICE DAILY - rinse mouth after treatment) 6 mL 5   carvedilol (COREG) 6.25 MG tablet Take 1.5  tablets (9.375 mg total) by mouth 2 (two) times daily. 270 tablet 0   Cholecalciferol (VITAMIN D3) 5000 UNITS CAPS Take 5,000 Units by mouth daily.     docusate sodium (COLACE) 100 MG capsule Take 100 mg by mouth daily as needed for mild constipation.     empagliflozin (JARDIANCE) 10 MG TABS tablet Take 1 tablet (10 mg total) by mouth daily before breakfast. 30 tablet 11   ferrous sulfate 325 (65 FE) MG tablet Take 325 mg by mouth daily with breakfast.     hydrALAZINE (APRESOLINE) 50 MG tablet TAKE 1 TABLET BY MOUTH 3  TIMES DAILY (Patient taking differently: Take 50 mg by mouth 3 (three) times daily.) 270 tablet 3   HYDROcodone-acetaminophen (NORCO) 5-325 MG tablet Take 2 tablets by mouth every 4 (four)  hours as needed. (Patient taking differently: Take 1-2 tablets by mouth every 4 (four) hours as needed for moderate pain.) 10 tablet 0   Hypromell-Glycerin-Naphazoline 0.8-0.25-0.012 % SOLN Place 1-2 drops into both eyes 3 (three) times daily as needed (for dry eyes.).     ipratropium-albuterol (DUONEB) 0.5-2.5 (3) MG/3ML SOLN USE 1 VIAL IN NEBULIZER 4 TIMES DAILY (Patient taking differently: Take 3 mLs by nebulization 4 (four) times daily.) 9 mL 5   levothyroxine (SYNTHROID) 112 MCG tablet TAKE 1 TABLET BY MOUTH  DAILY BEFORE BREAKFAST (Patient taking differently: Take 112 mcg by mouth daily before breakfast.) 90 tablet 3   lovastatin (MEVACOR) 40 MG tablet Take 2 tablets (80 mg total) by mouth at bedtime. 180 tablet 3   Multiple Vitamins-Minerals (MULTIVITAMINS THER. W/MINERALS) TABS Take 1 tablet by mouth daily.     nitroGLYCERIN (NITROSTAT) 0.4 MG SL tablet Place 1 tablet (0.4 mg total) under the tongue every 5 (five) minutes as needed for chest pain. 6 tablet 1   OXYGEN Inhale 2 L/min into the lungs See admin instructions. 2 L/min at bedtime and as needed daily as needed for shortness of breath     pantoprazole (PROTONIX) 40 MG tablet Take 1 tablet (40 mg total) by mouth 2 (two) times daily.  (Patient taking differently: Take 40 mg by mouth in the morning and at bedtime.) 180 tablet 2   sacubitril-valsartan (ENTRESTO) 97-103 MG Take 1 tablet by mouth 2 (two) times daily. 180 tablet 3   spironolactone (ALDACTONE) 25 MG tablet Take 0.5 tablets (12.5 mg total) by mouth daily. 30 tablet 3   sucralfate (CARAFATE) 1 g tablet TAKE 1 TABLET BY MOUTH 4  TIMES DAILY 1 HOUR BEFORE  MEALS AND AT BEDTIME (Patient taking differently: Take 1 g by mouth See admin instructions. Take 1 gram by mouth four times a day- ONE HOUR before meals and at bedtime) 360 tablet 1   warfarin (COUMADIN) 5 MG tablet TAKE 1 TABLET BY MOUTH  EVERY MONDAY, WEDNESDAY,  AND FRIDAY AND ONE-HALF  TABLET BY MOUTH ON THE  REMAINING DAYS OF THE WEEK (Patient taking differently: Take 2.5 mg by mouth daily.) 65 tablet 0   cephALEXin (KEFLEX) 500 MG capsule Take 1 capsule (500 mg total) by mouth 4 (four) times daily. (Patient not taking: No sig reported) 28 capsule 0   fluticasone furoate-vilanterol (BREO ELLIPTA) 200-25 MCG/INH AEPB Inhale 1 puff into the lungs daily. (Patient not taking: Reported on 05/23/2021) 28 each 2   loratadine (CLARITIN) 10 MG tablet Take 10 mg by mouth daily as needed (for allergies.).       Drug Regimen Review  Drug regimen was reviewed and remains appropriate with no significant issues identified  Home: Home Living Family/patient expects to be discharged to:: Private residence Living Arrangements: Spouse/significant other Available Help at Discharge: Family, Available 24 hours/day Type of Home: House Home Access: Stairs to enter Technical brewer of Steps: 3 Entrance Stairs-Rails: Left Home Layout: One level Bathroom Shower/Tub: Tub/shower unit, Multimedia programmer: Standard Home Equipment: Environmental consultant - 2 wheels, Other (comment), Shower seat, Cane - single point, Hand held shower head Additional Comments: Supplemental O2 at night   Functional History: Prior Function Level of  Independence: Independent Comments: Enjoys golf, visiting grandkids, drives  Functional Status:  Mobility: Bed Mobility Overal bed mobility: Needs Assistance Bed Mobility: Supine to Sit Supine to sit: Supervision, HOB elevated Sit to supine: Min assist General bed mobility comments: Difficulty scooting to eob and difficulty getting feet  back into bed Transfers Overall transfer level: Needs assistance Equipment used: Rolling walker (2 wheeled) Transfers: Sit to/from Stand Sit to Stand: Min guard Stand pivot transfers: Mod assist, +2 safety/equipment General transfer comment: performed standing from eob with min assist to stand after 3 attempts on his own Ambulation/Gait Ambulation/Gait assistance: Min guard Gait Distance (Feet): 80 Feet Assistive device: Rolling walker (2 wheeled) Gait Pattern/deviations: Step-through pattern General Gait Details: pt with slowed step-through gait, reduced gait speed Gait velocity: reduced Gait velocity interpretation: <1.8 ft/sec, indicate of risk for recurrent falls    ADL: ADL Overall ADL's : Needs assistance/impaired Eating/Feeding: Set up, Sitting Grooming: Set up, Bed level Upper Body Bathing: Minimal assistance, Sitting Lower Body Bathing: Moderate assistance, Sit to/from stand Upper Body Dressing : Minimal assistance, Sitting Lower Body Dressing: Moderate assistance, Sitting/lateral leans Lower Body Dressing Details (indicate cue type and reason): Doffed and donned non-skid socks from eob assistance to thread over feet Toilet Transfer: Minimal assistance (simulated in room) Toilet Transfer Details (indicate cue type and reason): Min A for power up and maintaining balance Functional mobility during ADLs: Minimal assistance (side steps towards HOB) General ADL Comments: Pt presenting with decreased actiivty tolerance and balance. Fatigues quickly. Reports he didnt sleep well due to pain. Benefits from increased  encouragement  Cognition: Cognition Overall Cognitive Status: Within Functional Limits for tasks assessed Orientation Level: Oriented X4 Cognition Arousal/Alertness: Awake/alert Behavior During Therapy: WFL for tasks assessed/performed Overall Cognitive Status: Within Functional Limits for tasks assessed  Physical Exam: Blood pressure (!) 148/52, pulse 60, temperature 98.2 F (36.8 C), temperature source Oral, resp. rate 16, height 5\' 9"  (1.753 m), weight 81.6 kg, SpO2 99 %. Physical Exam Vitals reviewed.  Constitutional:      General: He is not in acute distress.    Appearance: He is not ill-appearing.  HENT:     Head: Normocephalic and atraumatic.     Right Ear: External ear normal.     Left Ear: External ear normal.     Nose: Nose normal.  Eyes:     General:        Right eye: No discharge.        Left eye: No discharge.     Extraocular Movements: Extraocular movements intact.  Cardiovascular:     Rate and Rhythm: Normal rate and regular rhythm.  Pulmonary:     Effort: Pulmonary effort is normal.     Breath sounds: Normal breath sounds.     Comments: + [place] Abdominal:     General: Abdomen is flat. There is no distension.     Comments: Bowel sounds hypoactive  Musculoskeletal:     Cervical back: Normal range of motion and neck supple.     Comments: No edema or tenderness in extremities  Skin:    General: Skin is warm and dry.  Neurological:     Mental Status: He is alert.     Comments: Alert and oriented Makes eye contact with examiner. Motor: Bilateral upper extremities: 4/5 proximal distal Right lower extremity: 4/5 proximal distal Left lower extremity: 4-/5 proximal distal  Psychiatric:        Mood and Affect: Mood normal.        Behavior: Behavior normal.    Results for orders placed or performed during the hospital encounter of 05/22/21 (from the past 48 hour(s))  Glucose, capillary     Status: None   Collection Time: 06/03/21  4:45 PM  Result Value Ref  Range   Glucose-Capillary 78 70 -  99 mg/dL    Comment: Glucose reference range applies only to samples taken after fasting for at least 8 hours.  Glucose, capillary     Status: None   Collection Time: 06/03/21  8:34 PM  Result Value Ref Range   Glucose-Capillary 79 70 - 99 mg/dL    Comment: Glucose reference range applies only to samples taken after fasting for at least 8 hours.  Protime-INR     Status: Abnormal   Collection Time: 06/04/21  3:53 AM  Result Value Ref Range   Prothrombin Time 30.7 (H) 11.4 - 15.2 seconds   INR 2.9 (H) 0.8 - 1.2    Comment: (NOTE) INR goal varies based on device and disease states. Performed at [facility], [place] [address], [place], [place] [address]   Basic metabolic panel     Status: Abnormal   Collection Time: 06/04/21  3:53 AM  Result Value Ref Range   Sodium 134 (L) 135 - 145 mmol/L   Potassium 3.8 3.5 - 5.1 mmol/L   Chloride 97 (L) 98 - 111 mmol/L   CO2 24 22 - 32 mmol/L   Glucose, Bld 65 (L) 70 - 99 mg/dL    Comment: Glucose reference range applies only to samples taken after fasting for at least 8 hours.   BUN 47 (H) 8 - 23 mg/dL   Creatinine, Ser 5.97 (H) 0.61 - 1.24 mg/dL   Calcium 7.9 (L) 8.9 - 10.3 mg/dL   GFR, Estimated 9 (L) >60 mL/min    Comment: (NOTE) Calculated using the CKD-EPI Creatinine Equation (2021)    Anion gap 13 5 - 15    Comment: Performed at [facility] [address] [place], [place] [address]  CBC with Differential/Platelet     Status: Abnormal   Collection Time: 06/04/21  3:53 AM  Result Value Ref Range   WBC 8.8 4.0 - 10.5 K/uL   RBC 2.61 (L) 4.22 - 5.81 MIL/uL   Hemoglobin 8.1 (L) 13.0 - 17.0 g/dL   HCT 25.2 (L) 39.0 - 52.0 %   MCV 96.6 80.0 - 100.0 fL   MCH 31.0 26.0 - 34.0 pg   MCHC 32.1 30.0 - 36.0 g/dL   RDW 14.2 11.5 - 15.5 %   Platelets 241 150 - 400 K/uL   nRBC 0.0 0.0 - 0.2 %   Neutrophils Relative % 74 %   Neutro Abs 6.5 1.7 - 7.7 K/uL   Lymphocytes Relative 7 %   Lymphs Abs  0.6 (L) 0.7 - 4.0 K/uL   Monocytes Relative 13 %   Monocytes Absolute 1.2 (H) 0.1 - 1.0 K/uL   Eosinophils Relative 4 %   Eosinophils Absolute 0.4 0.0 - 0.5 K/uL   Basophils Relative 1 %   Basophils Absolute 0.1 0.0 - 0.1 K/uL   Immature Granulocytes 1 %   Abs Immature Granulocytes 0.06 0.00 - 0.07 K/uL    Comment: Performed at [facility], [place] [address] [place], [place] [address]  Magnesium     Status: None   Collection Time: 06/04/21  3:53 AM  Result Value Ref Range   Magnesium 2.1 1.7 - 2.4 mg/dL    Comment: Performed at [facility] [address], [place], [place] [address]  Glucose, capillary     Status: Abnormal   Collection Time: 06/04/21  6:38 AM  Result Value Ref Range   Glucose-Capillary 68 (L) 70 - 99 mg/dL    Comment: Glucose reference range applies  only to samples taken after fasting for at least 8 hours.  Glucose, capillary     Status: None   Collection Time: 06/04/21 11:35 AM  Result Value Ref Range   Glucose-Capillary 75 70 - 99 mg/dL    Comment: Glucose reference range applies only to samples taken after fasting for at least 8 hours.  Glucose, capillary     Status: Abnormal   Collection Time: 06/04/21  4:38 PM  Result Value Ref Range   Glucose-Capillary 105 (H) 70 - 99 mg/dL    Comment: Glucose reference range applies only to samples taken after fasting for at least 8 hours.  Glucose, capillary     Status: Abnormal   Collection Time: 06/04/21  8:26 PM  Result Value Ref Range   Glucose-Capillary 104 (H) 70 - 99 mg/dL    Comment: Glucose reference range applies only to samples taken after fasting for at least 8 hours.  Protime-INR     Status: Abnormal   Collection Time: 06/05/21  4:41 AM  Result Value Ref Range   Prothrombin Time 27.6 (H) 11.4 - 15.2 seconds   INR 2.6 (H) 0.8 - 1.2    Comment: (NOTE) INR goal varies based on device and disease states. Performed at [facility], [place] [address], [place], [place] [address]   Glucose,  capillary     Status: None   Collection Time: 06/05/21  6:12 AM  Result Value Ref Range   Glucose-Capillary 99 70 - 99 mg/dL    Comment: Glucose reference range applies only to samples taken after fasting for at least 8 hours.   No results found.     Medical Problem List and Plan: 1.   Debility secondary to AKI on CKD/multi medical  -patient may not shower  -ELOS/Goals: 4-6 days/supervision/mod I  Admit to CIR 2.  Antithrombotics: -DVT/anticoagulation:  Pharmaceutical: Coumadin  -antiplatelet therapy: N/A 3. Pain Management: Tylenol as needed 4. Mood: Provide emotional support  -antipsychotic agents: N/A 5. Neuropsych: This patient is capable of making decisions on his own behalf. 6. Skin/Wound Care: Routine skin checks 7. Fluids/Electrolytes/Nutrition: Routine in and outs 8.  AKI on CKD.  Hemodialysis initiated.  Follow-up nephrology services  Labs with HD 9.  Acute on chronic anemia.  Patient did receive a total of 3 units packed red blood cells.  Continue iron supplement  CBC ordered 10.  History of COPD/asthma.  Chronically on 2.5-3 L oxygen nightly 11.  Diastolic congestive heart failure.  Monitor for any signs of fluid overload.  Currently on Coreg 3.125 mg twice daily and amlodipine.  Hydralazine Entresto and Aldactone currently on hold 12.  Atrial fibrillation.  Continue Coumadin.  Cardiac rate controlled 13.  Hyperlipidemia.  Pravachol 14.  GERD.  Protonix 15.  Hypothyroidism: Continue Synthroid  Lavon Paganini Angiulli, PA-C 06/05/2021  I have personally performed a face to face diagnostic evaluation, including, but not limited to relevant history and physical exam findings, of this patient and developed relevant assessment and plan.  Additionally, I have reviewed and concur with the physician assistant's documentation above.  Delice Lesch, MD, ABPMR

## 2021-06-05 NOTE — H&P (Signed)
Chief complaint: Weakness   Physical Medicine and Rehabilitation Admission H&P     HPI: [name] [name] is an 80 year old right-handed male with history of CAD/CABG, atrial fibrillation maintained on Coumadin, diastolic congestive heart failure, AICD, COPD with remote tobacco abuse with supplemental oxygen at night, CKD stage IV.  History taken from chart review and patient.  Patient lives with wife.  1 level home 3 steps to entry.  Reportedly independent prior to admission.  He presented on 05/22/2021 with recent UTI and progressive weakness and fatigue.  Denied any chest pain or shortness of breath.  In the ED blood pressure noted to be 181/59, BUN 91, creatinine 10.49 from a recent baseline 2.52, hemoglobin 7.6.  Renal ultrasound showed some left renal atrophy.  Lower pole right renal cyst or minimally complex cyst.  No hydronephrosis.  Renal service consulted for severe AKI on CKD with hemodialysis initiated currently on a Tuesday Thursday Saturday schedule and latest creatinine 5.97.  IR consulted for [facility] placement.  His chronic Coumadin has been resumed.  Therapy evaluations completed due to patient decreased functional mobility was admitted for a comprehensive rehab program.  Please see preadmission assessment from earlier today as well.  Review of Systems  Constitutional:  Positive for malaise/fatigue. Negative for chills and fever.  HENT:  Negative for hearing loss.   Eyes:  Negative for blurred vision and double vision.  Respiratory:  Negative for cough and shortness of breath.   Cardiovascular:  Negative for chest pain.  Gastrointestinal:  Positive for constipation. Negative for heartburn, nausea and vomiting.  Genitourinary:  Negative for dysuria, flank pain and hematuria.  Musculoskeletal:  Positive for joint pain and myalgias.  Skin:  Negative for rash.  Neurological:  Positive for focal weakness and weakness.  All other systems reviewed and are negative. Past Medical History:   Diagnosis Date   AICD (automatic cardioverter/defibrillator) present 04/05/2017   Anemia    Arthritis    "hips; back" (07/09/2014)   Asthma    Atrial fibrillation (HCC)    CAD (coronary artery disease)    CHF (congestive heart failure) ([place])    Cholecystitis 11/2013   CKD (chronic kidney disease) 2015   Stage  3.    COPD (chronic obstructive pulmonary disease) (HCC)    on home oxygen, 2 liters at night10/2015   Esophageal reflux    Gallstones    Gastric antral vascular ectasia 2013   Gout    Heme positive stool    Hepatomegaly    Hiatal hernia    History of blood transfusion ~ 2012   "blood count dropped; had to get 3 units"   Hyperlipidemia    Hypothyroidism    Other and unspecified coagulation defects    Personal history of colonic polyps 05/29/2010   TUBULAR ADENOMA   Unspecified essential hypertension    Past Surgical History:  Procedure Laterality Date   CARDIAC CATHETERIZATION N/A 07/15/2016   Procedure: Right/Left Heart Cath and Coronary/Graft Angiography;  Surgeon: Belva Crome, MD;  Location: [place] [facility];  Service: Cardiovascular;  Laterality: N/A;   cholecystomy tube  12/28/2014 - 01/06/2015   tube clogged with debris, removed and IR unable to place new tube.    COLONOSCOPY  2013   Dr. Sharlett Iles: no polyps or evidence of active bleeding   CORONARY ANGIOPLASTY WITH STENT PLACEMENT  ~ 2008; 07/08/2014   "1 + 3"   Winter   CABG X4   ESOPHAGOGASTRODUODENOSCOPY  2013  Dr. 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 Sharlett Iles: AVM at 19 and blood at 30 minutes beyond first duodenal image but not actual lesion seen. If persistent IDA, bleeding, recommend enteroscopy with ablation     HERNIA REPAIR     ICD IMPLANT N/A 04/05/2017   Procedure: ICD Implant;  Surgeon: Evans Lance, MD;  Location: [place] [facility];  Service: Cardiovascular;  Laterality: N/A;   IR FLUORO GUIDE CV LINE RIGHT  05/27/2021   IR FLUORO GUIDE CV LINE RIGHT  06/02/2021   IR US GUIDE VASC ACCESS RIGHT  05/27/2021   LEFT AND RIGHT HEART CATHETERIZATION WITH CORONARY/GRAFT ANGIOGRAM N/A 07/09/2014   Right and left heart cath, bare metal stent to SVG to RCA. Sinclair Grooms, MD;    UMBILICAL HERNIA REPAIR  [phone]   Family History  Problem Relation Age of Onset   Leukemia Mother    Kidney disease Mother        kidney removed    Heart attack Brother    Heart disease Brother    Heart disease Father    Stomach cancer Sister    Stomach cancer Sister    Early death Brother    Hypertension Other        family    Colon cancer Neg Hx    Social History:  reports that he quit smoking about 24 years ago. His smoking use included cigarettes. He has a 42.00 pack-year smoking history. He has never used smokeless tobacco. He reports current alcohol use of about 1.0 standard drink per week. He reports that he does not use drugs. Allergies: No Known Allergies Medications Prior to Admission  Medication Sig Dispense Refill   acetaminophen (TYLENOL) 500 MG tablet Take 500 mg by mouth every 6 (six) hours as needed for headache or mild pain.     albuterol (VENTOLIN HFA) 108 (90 Base) MCG/ACT inhaler USE 2 INHALATIONS BY MOUTH  EVERY 6 HOURS AS NEEDED FOR SHORTNESS OF BREATH (Patient taking differently: Inhale 2 puffs into the lungs every 6 (six) hours as needed for shortness of breath.) 34 g 0   budesonide (PULMICORT) 0.25 MG/2ML nebulizer solution USE 1 VIAL  IN  NEBULIZER TWICE  DAILY - rinse mouth after treatment (Patient taking differently: Take 0.25 mg by nebulization See admin instructions. USE 1 VIAL  IN  NEBULIZER TWICE DAILY - rinse mouth after treatment) 6 mL 5   carvedilol (COREG) 6.25 MG tablet Take 1.5  tablets (9.375 mg total) by mouth 2 (two) times daily. 270 tablet 0   Cholecalciferol (VITAMIN D3) 5000 UNITS CAPS Take 5,000 Units by mouth daily.     docusate sodium (COLACE) 100 MG capsule Take 100 mg by mouth daily as needed for mild constipation.     empagliflozin (JARDIANCE) 10 MG TABS tablet Take 1 tablet (10 mg total) by mouth daily before breakfast. 30 tablet 11   ferrous sulfate 325 (65 FE) MG tablet Take 325 mg by mouth daily with breakfast.     hydrALAZINE (APRESOLINE) 50 MG tablet TAKE 1 TABLET BY MOUTH 3  TIMES DAILY (Patient taking differently: Take 50 mg by mouth 3 (three) times daily.) 270 tablet 3   HYDROcodone-acetaminophen (NORCO) 5-325 MG tablet Take 2 tablets by mouth every 4 (four)  hours as needed. (Patient taking differently: Take 1-2 tablets by mouth every 4 (four) hours as needed for moderate pain.) 10 tablet 0   Hypromell-Glycerin-Naphazoline 0.8-0.25-0.012 % SOLN Place 1-2 drops into both eyes 3 (three) times daily as needed (for dry eyes.).     ipratropium-albuterol (DUONEB) 0.5-2.5 (3) MG/3ML SOLN USE 1 VIAL IN NEBULIZER 4 TIMES DAILY (Patient taking differently: Take 3 mLs by nebulization 4 (four) times daily.) 9 mL 5   levothyroxine (SYNTHROID) 112 MCG tablet TAKE 1 TABLET BY MOUTH  DAILY BEFORE BREAKFAST (Patient taking differently: Take 112 mcg by mouth daily before breakfast.) 90 tablet 3   lovastatin (MEVACOR) 40 MG tablet Take 2 tablets (80 mg total) by mouth at bedtime. 180 tablet 3   Multiple Vitamins-Minerals (MULTIVITAMINS THER. W/MINERALS) TABS Take 1 tablet by mouth daily.     nitroGLYCERIN (NITROSTAT) 0.4 MG SL tablet Place 1 tablet (0.4 mg total) under the tongue every 5 (five) minutes as needed for chest pain. 6 tablet 1   OXYGEN Inhale 2 L/min into the lungs See admin instructions. 2 L/min at bedtime and as needed daily as needed for shortness of breath     pantoprazole (PROTONIX) 40 MG tablet Take 1 tablet (40 mg total) by mouth 2 (two) times daily.  (Patient taking differently: Take 40 mg by mouth in the morning and at bedtime.) 180 tablet 2   sacubitril-valsartan (ENTRESTO) 97-103 MG Take 1 tablet by mouth 2 (two) times daily. 180 tablet 3   spironolactone (ALDACTONE) 25 MG tablet Take 0.5 tablets (12.5 mg total) by mouth daily. 30 tablet 3   sucralfate (CARAFATE) 1 g tablet TAKE 1 TABLET BY MOUTH 4  TIMES DAILY 1 HOUR BEFORE  MEALS AND AT BEDTIME (Patient taking differently: Take 1 g by mouth See admin instructions. Take 1 gram by mouth four times a day- ONE HOUR before meals and at bedtime) 360 tablet 1   warfarin (COUMADIN) 5 MG tablet TAKE 1 TABLET BY MOUTH  EVERY MONDAY, WEDNESDAY,  AND FRIDAY AND ONE-HALF  TABLET BY MOUTH ON THE  REMAINING DAYS OF THE WEEK (Patient taking differently: Take 2.5 mg by mouth daily.) 65 tablet 0   cephALEXin (KEFLEX) 500 MG capsule Take 1 capsule (500 mg total) by mouth 4 (four) times daily. (Patient not taking: No sig reported) 28 capsule 0   fluticasone furoate-vilanterol (BREO ELLIPTA) 200-25 MCG/INH AEPB Inhale 1 puff into the lungs daily. (Patient not taking: Reported on 05/23/2021) 28 each 2   loratadine (CLARITIN) 10 MG tablet Take 10 mg by mouth daily as needed (for allergies.).       Drug Regimen Review  Drug regimen was reviewed and remains appropriate with no significant issues identified  Home: Home Living Family/patient expects to be discharged to:: Private residence Living Arrangements: Spouse/significant other Available Help at Discharge: Family, Available 24 hours/day Type of Home: House Home Access: Stairs to enter Technical brewer of Steps: 3 Entrance Stairs-Rails: Left Home Layout: One level Bathroom Shower/Tub: Tub/shower unit, Multimedia programmer: Standard Home Equipment: Environmental consultant - 2 wheels, Other (comment), Shower seat, Cane - single point, Hand held shower head Additional Comments: Supplemental O2 at night   Functional History: Prior Function Level of  Independence: Independent Comments: Enjoys golf, visiting grandkids, drives  Functional Status:  Mobility: Bed Mobility Overal bed mobility: Needs Assistance Bed Mobility: Supine to Sit Supine to sit: Supervision, HOB elevated Sit to supine: Min assist General bed mobility comments: Difficulty scooting to eob and difficulty getting feet  back into bed Transfers Overall transfer level: Needs assistance Equipment used: Rolling walker (2 wheeled) Transfers: Sit to/from Stand Sit to Stand: Min guard Stand pivot transfers: Mod assist, +2 safety/equipment General transfer comment: performed standing from eob with min assist to stand after 3 attempts on his own Ambulation/Gait Ambulation/Gait assistance: Min guard Gait Distance (Feet): 80 Feet Assistive device: Rolling walker (2 wheeled) Gait Pattern/deviations: Step-through pattern General Gait Details: pt with slowed step-through gait, reduced gait speed Gait velocity: reduced Gait velocity interpretation: <1.8 ft/sec, indicate of risk for recurrent falls    ADL: ADL Overall ADL's : Needs assistance/impaired Eating/Feeding: Set up, Sitting Grooming: Set up, Bed level Upper Body Bathing: Minimal assistance, Sitting Lower Body Bathing: Moderate assistance, Sit to/from stand Upper Body Dressing : Minimal assistance, Sitting Lower Body Dressing: Moderate assistance, Sitting/lateral leans Lower Body Dressing Details (indicate cue type and reason): Doffed and donned non-skid socks from eob assistance to thread over feet Toilet Transfer: Minimal assistance (simulated in room) Toilet Transfer Details (indicate cue type and reason): Min A for power up and maintaining balance Functional mobility during ADLs: Minimal assistance (side steps towards HOB) General ADL Comments: Pt presenting with decreased actiivty tolerance and balance. Fatigues quickly. Reports he didnt sleep well due to pain. Benefits from increased  encouragement  Cognition: Cognition Overall Cognitive Status: Within Functional Limits for tasks assessed Orientation Level: Oriented X4 Cognition Arousal/Alertness: Awake/alert Behavior During Therapy: WFL for tasks assessed/performed Overall Cognitive Status: Within Functional Limits for tasks assessed  Physical Exam: Blood pressure (!) 148/52, pulse 60, temperature 98.2 F (36.8 C), temperature source Oral, resp. rate 16, height 5\' 9"  (1.753 m), weight 81.6 kg, SpO2 99 %. Physical Exam Vitals reviewed.  Constitutional:      General: He is not in acute distress.    Appearance: He is not ill-appearing.  HENT:     Head: Normocephalic and atraumatic.     Right Ear: External ear normal.     Left Ear: External ear normal.     Nose: Nose normal.  Eyes:     General:        Right eye: No discharge.        Left eye: No discharge.     Extraocular Movements: Extraocular movements intact.  Cardiovascular:     Rate and Rhythm: Normal rate and regular rhythm.  Pulmonary:     Effort: Pulmonary effort is normal.     Breath sounds: Normal breath sounds.     Comments: + [place] Abdominal:     General: Abdomen is flat. There is no distension.     Comments: Bowel sounds hypoactive  Musculoskeletal:     Cervical back: Normal range of motion and neck supple.     Comments: No edema or tenderness in extremities  Skin:    General: Skin is warm and dry.  Neurological:     Mental Status: He is alert.     Comments: Alert and oriented Makes eye contact with examiner. Motor: Bilateral upper extremities: 4/5 proximal distal Right lower extremity: 4/5 proximal distal Left lower extremity: 4-/5 proximal distal  Psychiatric:        Mood and Affect: Mood normal.        Behavior: Behavior normal.    Results for orders placed or performed during the hospital encounter of 05/22/21 (from the past 48 hour(s))  Glucose, capillary     Status: None   Collection Time: 06/03/21  4:45 PM  Result Value Ref  Range   Glucose-Capillary 78 70 -  99 mg/dL    Comment: Glucose reference range applies only to samples taken after fasting for at least 8 hours.  Glucose, capillary     Status: None   Collection Time: 06/03/21  8:34 PM  Result Value Ref Range   Glucose-Capillary 79 70 - 99 mg/dL    Comment: Glucose reference range applies only to samples taken after fasting for at least 8 hours.  Protime-INR     Status: Abnormal   Collection Time: 06/04/21  3:53 AM  Result Value Ref Range   Prothrombin Time 30.7 (H) 11.4 - 15.2 seconds   INR 2.9 (H) 0.8 - 1.2    Comment: (NOTE) INR goal varies based on device and disease states. Performed at [facility], [place] [address] [place], [place] [address]   Basic metabolic panel     Status: Abnormal   Collection Time: 06/04/21  3:53 AM  Result Value Ref Range   Sodium 134 (L) 135 - 145 mmol/L   Potassium 3.8 3.5 - 5.1 mmol/L   Chloride 97 (L) 98 - 111 mmol/L   CO2 24 22 - 32 mmol/L   Glucose, Bld 65 (L) 70 - 99 mg/dL    Comment: Glucose reference range applies only to samples taken after fasting for at least 8 hours.   BUN 47 (H) 8 - 23 mg/dL   Creatinine, Ser 5.97 (H) 0.61 - 1.24 mg/dL   Calcium 7.9 (L) 8.9 - 10.3 mg/dL   GFR, Estimated 9 (L) >60 mL/min    Comment: (NOTE) Calculated using the CKD-EPI Creatinine Equation (2021)    Anion gap 13 5 - 15    Comment: Performed at [facility] [address] [place], [place] [address]  CBC with Differential/Platelet     Status: Abnormal   Collection Time: 06/04/21  3:53 AM  Result Value Ref Range   WBC 8.8 4.0 - 10.5 K/uL   RBC 2.61 (L) 4.22 - 5.81 MIL/uL   Hemoglobin 8.1 (L) 13.0 - 17.0 g/dL   HCT 25.2 (L) 39.0 - 52.0 %   MCV 96.6 80.0 - 100.0 fL   MCH 31.0 26.0 - 34.0 pg   MCHC 32.1 30.0 - 36.0 g/dL   RDW 14.2 11.5 - 15.5 %   Platelets 241 150 - 400 K/uL   nRBC 0.0 0.0 - 0.2 %   Neutrophils Relative % 74 %   Neutro Abs 6.5 1.7 - 7.7 K/uL   Lymphocytes Relative 7 %   Lymphs Abs  0.6 (L) 0.7 - 4.0 K/uL   Monocytes Relative 13 %   Monocytes Absolute 1.2 (H) 0.1 - 1.0 K/uL   Eosinophils Relative 4 %   Eosinophils Absolute 0.4 0.0 - 0.5 K/uL   Basophils Relative 1 %   Basophils Absolute 0.1 0.0 - 0.1 K/uL   Immature Granulocytes 1 %   Abs Immature Granulocytes 0.06 0.00 - 0.07 K/uL    Comment: Performed at [facility], [place] [address], [place], [place] [address]  Magnesium     Status: None   Collection Time: 06/04/21  3:53 AM  Result Value Ref Range   Magnesium 2.1 1.7 - 2.4 mg/dL    Comment: Performed at [facility] [address], [place], [place] [address]  Glucose, capillary     Status: Abnormal   Collection Time: 06/04/21  6:38 AM  Result Value Ref Range   Glucose-Capillary 68 (L) 70 - 99 mg/dL    Comment: Glucose reference range applies  only to samples taken after fasting for at least 8 hours.  Glucose, capillary     Status: None   Collection Time: 06/04/21 11:35 AM  Result Value Ref Range   Glucose-Capillary 75 70 - 99 mg/dL    Comment: Glucose reference range applies only to samples taken after fasting for at least 8 hours.  Glucose, capillary     Status: Abnormal   Collection Time: 06/04/21  4:38 PM  Result Value Ref Range   Glucose-Capillary 105 (H) 70 - 99 mg/dL    Comment: Glucose reference range applies only to samples taken after fasting for at least 8 hours.  Glucose, capillary     Status: Abnormal   Collection Time: 06/04/21  8:26 PM  Result Value Ref Range   Glucose-Capillary 104 (H) 70 - 99 mg/dL    Comment: Glucose reference range applies only to samples taken after fasting for at least 8 hours.  Protime-INR     Status: Abnormal   Collection Time: 06/05/21  4:41 AM  Result Value Ref Range   Prothrombin Time 27.6 (H) 11.4 - 15.2 seconds   INR 2.6 (H) 0.8 - 1.2    Comment: (NOTE) INR goal varies based on device and disease states. Performed at [facility], [place] [address], [place], [place] [address]   Glucose,  capillary     Status: None   Collection Time: 06/05/21  6:12 AM  Result Value Ref Range   Glucose-Capillary 99 70 - 99 mg/dL    Comment: Glucose reference range applies only to samples taken after fasting for at least 8 hours.   No results found.     Medical Problem List and Plan: 1.   Debility secondary to AKI on CKD/multi medical  -patient may not shower  -ELOS/Goals: 4-6 days/supervision/mod I  Admit to CIR 2.  Antithrombotics: -DVT/anticoagulation:  Pharmaceutical: Coumadin  -antiplatelet therapy: N/A 3. Pain Management: Tylenol as needed 4. Mood: Provide emotional support  -antipsychotic agents: N/A 5. Neuropsych: This patient is capable of making decisions on his own behalf. 6. Skin/Wound Care: Routine skin checks 7. Fluids/Electrolytes/Nutrition: Routine in and outs 8.  AKI on CKD.  Hemodialysis initiated.  Follow-up nephrology services  Labs with HD 9.  Acute on chronic anemia.  Patient did receive a total of 3 units packed red blood cells.  Continue iron supplement  CBC ordered 10.  History of COPD/asthma.  Chronically on 2.5-3 L oxygen nightly 11.  Diastolic congestive heart failure.  Monitor for any signs of fluid overload.  Currently on Coreg 3.125 mg twice daily and amlodipine.  Hydralazine Entresto and Aldactone currently on hold 12.  Atrial fibrillation.  Continue Coumadin.  Cardiac rate controlled 13.  Hyperlipidemia.  Pravachol 14.  GERD.  Protonix 15.  Hypothyroidism: Continue Synthroid  Lavon Paganini Angiulli, PA-C 06/05/2021  I have personally performed a face to face diagnostic evaluation, including, but not limited to relevant history and physical exam findings, of this patient and developed relevant assessment and plan.  Additionally, I have reviewed and concur with the physician assistant's documentation above.  Delice Lesch, MD, ABPMR  The patient's status has not changed. Any changes from the pre-admission screening or documentation from the acute chart are  noted above.   Delice Lesch, MD, ABPMR

## 2021-06-05 NOTE — Progress Notes (Signed)
Patient transferring to 4W report given to [facility]

## 2021-06-05 NOTE — Progress Notes (Signed)
Patient ID: [name] [name], male   DOB: 11/08/1940, 80 y.o.   MRN: [id number] S: no acute events. Patient informed me that he wishes to go to rehab which staff is working on O:BP (!) 148/52 (BP Location: Left Arm)   Pulse 60   Temp 98.2 F (36.8 C) (Oral)   Resp 16   Ht 5\' 9"  (1.753 m)   Wt 81.6 kg   SpO2 99%   BMI 26.57 kg/m  No intake or output data in the 24 hours ending 06/05/21 1215  Intake/Output: I/O last 3 completed shifts: In: 28 [P.O.:60] Out: 1100 [Urine:100; Other:1000]  Intake/Output this shift:  No intake/output data recorded. Weight change: -1 kg Gen:NAD CVS: RRR Resp: CTA Abd: +BS, soft, NT/ND Ext:1+ pedal edema  Recent Labs  Lab 05/30/21 0257 05/31/21 0342 06/01/21 1429 06/02/21 0422 06/03/21 0331 06/04/21 0353  NA 135 135 131* 131* 133* 134*  K 4.0 3.5 3.4* 3.6 3.8 3.8  CL 100 99 96* 94* 99 97*  CO2 25 28 26 25 24 24   GLUCOSE 109* 107* 119* 85 70 65*  BUN 82* 45* 58* 61* 37* 47*  CREATININE 8.61* 5.78* 6.77* 7.17* 5.16* 5.97*  ALBUMIN  --   --   --  2.2*  --   --   CALCIUM 8.0* 7.9* 7.8* 8.3* 7.8* 7.9*  PHOS  --   --   --  4.8*  --   --    Liver Function Tests: Recent Labs  Lab 06/02/21 0422  ALBUMIN 2.2*   No results for input(s): LIPASE, AMYLASE in the last 168 hours. No results for input(s): AMMONIA in the last 168 hours. CBC: Recent Labs  Lab 05/31/21 0342 06/01/21 1429 06/02/21 0422 06/03/21 0331 06/04/21 0353  WBC 10.0 11.7* 10.8* 9.3 8.8  NEUTROABS 7.7 9.5* 8.1* 6.9 6.5  HGB 6.9* 8.7* 8.2* 8.2* 8.1*  HCT 21.9* 26.4* 25.4* 25.5* 25.2*  MCV 98.2 95.3 94.8 96.6 96.6  PLT 217 247 256 213 241   Cardiac Enzymes: No results for input(s): CKTOTAL, CKMB, CKMBINDEX, TROPONINI in the last 168 hours. CBG: Recent Labs  Lab 06/04/21 1135 06/04/21 1638 06/04/21 2026 06/05/21 0612 06/05/21 1201  GLUCAP 75 105* 104* 99 87    Iron Studies: No results for input(s): IRON, TIBC, TRANSFERRIN, FERRITIN in the last 72  hours. Studies/Results: No results found.  sodium chloride   Intravenous Once   amLODipine  10 mg Oral Daily   budesonide  0.25 mg Nebulization BID   carvedilol  3.125 mg Oral BID WC   Chlorhexidine Gluconate Cloth  6 each Topical Daily   feeding supplement (NEPRO CARB STEADY)  237 mL Oral TID BM   ferrous sulfate  325 mg Oral Q breakfast   insulin aspart  0-5 Units Subcutaneous QHS   insulin aspart  0-6 Units Subcutaneous TID WC   ipratropium-albuterol  3 mL Nebulization BID   levothyroxine  112 mcg Oral Q0600   multivitamin with minerals  1 tablet Oral Daily   pantoprazole  40 mg Oral BID   pravastatin  40 mg Oral q1800   warfarin  2.5 mg Oral ONCE-1600   Warfarin - Pharmacist Dosing Inpatient   Does not apply q1600    BMET    Component Value Date/Time   NA 134 (L) 06/04/2021 0353   NA 145 (H) 01/06/2021 1031   K 3.8 06/04/2021 0353   CL 97 (L) 06/04/2021 0353   CO2 24 06/04/2021 0353   GLUCOSE 65 (L) 06/04/2021  0353   BUN 47 (H) 06/04/2021 0353   BUN 27 01/06/2021 1031   CREATININE 5.97 (H) 06/04/2021 0353   CREATININE 1.31 (H) 07/14/2016 0915   CALCIUM 7.9 (L) 06/04/2021 0353   GFRNONAA 9 (L) 06/04/2021 0353   GFRAA 34 (L) 09/09/2020 0930   CBC    Component Value Date/Time   WBC 8.8 06/04/2021 0353   RBC 2.61 (L) 06/04/2021 0353   HGB 8.1 (L) 06/04/2021 0353   HGB 10.5 (L) 09/09/2020 0930   HCT 25.2 (L) 06/04/2021 0353   HCT 31.6 (L) 09/09/2020 0930   PLT 241 06/04/2021 0353   PLT 231 09/09/2020 0930   MCV 96.6 06/04/2021 0353   MCV 97 09/09/2020 0930   MCH 31.0 06/04/2021 0353   MCHC 32.1 06/04/2021 0353   RDW 14.2 06/04/2021 0353   RDW 12.0 09/09/2020 0930   LYMPHSABS 0.6 (L) 06/04/2021 0353   LYMPHSABS 1.1 09/09/2020 0930   MONOABS 1.2 (H) 06/04/2021 0353   EOSABS 0.4 06/04/2021 0353   EOSABS 0.3 09/09/2020 0930   BASOSABS 0.1 06/04/2021 0353   BASOSABS 0.1 09/09/2020 0930    Assessment/Plan:   AKI/CKD stage IV, oliguric - presumably due to  ischemic ATN due to volume depletion and concomitant use of ARB, aldactone, and jardiance.  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  Started on HD 05/28/21 with temp HD catheter. Continue on TTS schedule for now and follow for renal recovery.  S/p TDC placement 06/02/21 w/ IR No signs of renal recovery, will need outpatient HD for AKI. 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 ABLA and anemia of CKD stage IV - transfuse prn.  No ESA due to pelvic mass. A fib - coumadin CKD-BMD - stable COPD - pt with bilateral wheezing, nebs per primary svc. Disposition - CIR pending  Gean Quint, MD [facility]

## 2021-06-05 NOTE — Progress Notes (Signed)
Inpatient Rehabilitation Admissions Coordinator   Patient and wife in agreement to CIR admit today. I have alerted acute team, Dr Pietro Cassis and [facility], I will make the arrangements to admit today.I will notify hemodialysis.  Danne Baxter, RN, MSN Rehab Admissions Coordinator [phone] 06/05/2021 1:39 PM

## 2021-06-05 NOTE — Progress Notes (Signed)
ANTICOAGULATION CONSULT NOTE - Follow Up Consult  Pharmacy Consult for Warfarin Indication: atrial fibrillation  No Known Allergies  Patient Measurements: Height: 5\' 9"  (175.3 cm) Weight: 81.6 kg (179 lb 14.3 oz) IBW/kg (Calculated) : 70.7  Vital Signs: Temp: 98.2 F (36.8 C) (09/02 0822) Temp Source: Oral (09/02 0822) BP: 148/52 (09/02 0822) Pulse Rate: 60 (09/02 0822)  Labs: Recent Labs    06/03/21 0331 06/04/21 0353 06/05/21 0441  HGB 8.2* 8.1*  --   HCT 25.5* 25.2*  --   PLT 213 241  --   LABPROT 25.0* 30.7* 27.6*  INR 2.3* 2.9* 2.6*  CREATININE 5.16* 5.97*  --      Estimated Creatinine Clearance: 9.7 mL/min (A) (by C-G formula based on SCr of 5.97 mg/dL (H)).   Assessment: 80yo male on warfarin PTA for Afib. Admit INR 2.7 was therapeutic on PTA regimen of warfarin 5mg  Tue and Fri, 2.5 mg all other days (recently changed 8/18).  INR this AM trending down slightly to 2.6, last 2 doses have been held, no reports of bleeding today and will begin adding back warfarin at reduced dose from usual PTA 5mg  Tue and Fri  Goal of Therapy:  INR 2-3 Monitor platelets by anticoagulation protocol: Yes   Plan:  Warfarin reduced dose 2.5 mg PO x 1 Daily INR, s/s bleeding  Bertis Ruddy, PharmD Clinical Pharmacist Please check AMION for all [place] numbers 06/05/2021 10:59 AM

## 2021-06-05 NOTE — Progress Notes (Signed)
Patient admitted to [facility] This afternoon. No complaints of pain, call light and personal belongings within reach. Angie Fava

## 2021-06-05 NOTE — Progress Notes (Signed)
Patient ID: [name] [name], male   DOB: 03/12/1941, 79 y.o.   MRN: [id number]  Met with patient and spouse in patient room. Introduced myself and explained my role in his care. Went over rehab process, rehab schedule, Health Resource Notebook, and fall prevention plan. Explained that he would go to HD after therapy on those days. Gave additional educational handouts on Living with Heart Failure, CKD, Hemodialysis After Care, Food Basics for Chronic Kidney Disease, Acute Kidney Injury, and Hydronephrosis. Patient and spouse verbalized understanding with information given. Will continue to monitor patient.  Stacey Jennings, RN3, BSN, CBIS, CRRN, WTA Care Coordinator, Inpatient Rehabilitation Office [phone] Cell [phone]  

## 2021-06-05 NOTE — Progress Notes (Addendum)
Inpatient Rehabilitation Medication Review by a Pharmacist  A complete drug regimen review was completed for this patient to identify any potential clinically significant medication issues.  High Risk Drug Classes Is patient taking? Indication by Medication  Antipsychotic No   Anticoagulant Yes Warfarin for Afib  Antibiotic No   Opioid No   Antiplatelet No   Hypoglycemics/insulin No   Vasoactive Medication Yes Carvedilol and Norvasc for BP  Chemotherapy No   Other No      Type of Medication Issue Identified Description of Issue Recommendation(s)  Drug Interaction(s) (clinically significant)     Duplicate Therapy     Allergy     No Medication Administration End Date     Incorrect Dose     Additional Drug Therapy Needed     Significant med changes from prior encounter (inform family/care partners about these prior to discharge).    Other       Clinically significant medication issues were identified that warrant physician communication and completion of prescribed/recommended actions by midnight of the next day:  No  Pharmacist comments: No issues  Time spent performing this drug regimen review (minutes):  20 min   Tad Moore 06/05/2021 3:23 PM

## 2021-06-05 NOTE — Progress Notes (Signed)
Nutrition Follow-up  DOCUMENTATION CODES:  Not applicable  INTERVENTION:   Recommend initiation of bowel regimen as no BM documented since 05/29/21  Recommend discontinuing MVI with minerals and ordering rena-vite daily  -Continue Nepro Shake po TID, each supplement provides 425 kcal and 19 grams protein -Continue Magic Cup TID  NUTRITION DIAGNOSIS:  Increased nutrient needs related to acute illness (AKI) as evidenced by estimated needs. -- ongoing  GOAL:  Patient will meet greater than or equal to 90% of their needs -- not met  MONITOR:  PO intake, Supplement acceptance, Labs, Weight trends, I & O's  REASON FOR ASSESSMENT:  Malnutrition Screening Tool    ASSESSMENT:  80 yo male with a PMH of CAD s/p CABG, atrial fibrillation, CHF, AICD, COPD, CKD stage IIIb, presented in the ED after he was sent from PCP to go to the ED for abnormal labs.  He was found to have hyperkalemia and AKI.  CT abdomen and pelvis showed mild right hydronephrosis.  8/24 - temporary HD cath placed with IR 8/25 - started HD 8/30 - temp to tunneled HD cath conversion with IR  Pt initiated on dialysis due to K+ and Cr levels not improving with conservative measures. Hospital course was then complicated by bleeding from the dialysis access site and hematuria requiring adjustment of warfarin dose. Per MD, pt now clinically stable. Pt's appeal for CIR admit was approved and pt now deciding between CIR admission vs home with [facility]   Appetite has been improving, though pt's average meal intake since last RD assessment is still ~25%. Per RN, pt with fairly good acceptance of Nepro shakes (ordered TID), though pt is noted to intermittently refuse supplement.   Last HD 9/1, 1L net UF Per-HD wt: 82.6 kg Post-HD wt: 81.6 kg Admission wt: 85 kg  Per RN edema assessment, pt continues to have generalized mild pitting edema.  Medications: ferrous sulfate, SSI, Synthroid, MVI with minerals, Protonix, Warfarin  Labs:  Na 134 (L), BUN 47 (H), Cr 5.97 (H) CBGs: 790-24-09  UOP: 1x unmeasured occurrence documented x24 hours I/O: -1.3L since admit  No BM documented since 8/26, recommend initiation of bowel regimen  Diet Order:   Diet Order             Diet renal with fluid restriction Fluid restriction: 1200 mL Fluid; Room service appropriate? Yes; Fluid consistency: Thin  Diet effective now                  EDUCATION NEEDS:  Education needs have been addressed  Skin:  Skin Assessment: Reviewed RN Assessment  Last BM:  05/29/21 - Type 6, black, small  Height:  Ht Readings from Last 1 Encounters:  05/23/21 $RemoveB'5\' 9"'BTejgNKT$  (1.753 m)   Weight:  Wt Readings from Last 1 Encounters:  06/04/21 81.6 kg   BMI:  Body mass index is 26.57 kg/m.  Estimated Nutritional Needs:  Kcal:  1950-2150 Protein:  90-105 grams Fluid:  UOP + 1000 ml   Larkin Ina, MS, RD, LDN (she/her/hers) RD pager number and weekend/on-call pager number located in [place].

## 2021-06-05 NOTE — Progress Notes (Signed)
Physical Therapy Treatment Patient Details Name: [name] [name] MRN: [id number] DOB: [birth year] Today's Date: 06/05/2021    History of Present Illness 80 y.o. yo male who presented to the ED for abnormal lab results (sent by his urologist) and for the evaluation of AKI on CKD and hyperkalemia. CT abdomen and pelvis showed mild right hydronephrosis. Pt started HD. PMH including HTN, COPD, CAD s/p CABG, A. fib on anticoagulation, CHF, CKD 4.    PT Comments    Pt tolerates treatment well although continues to demonstrate significant endurance deficits. Pt remains able to ambulate for limited distances with walker, requesting frequent seated rest breaks due to fatigue. Pt benefits from verbal cues for technique during bed all aspects of mobility in order to reduce falls risk. Pt will continue to benefit from aggressive mobilization to improve activity tolerance and aide in a return to independence.   Follow Up Recommendations  CIR     Equipment Recommendations  Rolling walker with 5" wheels;3in1 (PT)    Recommendations for Other Services       Precautions / Restrictions Precautions Precautions: Fall Restrictions Weight Bearing Restrictions: No    Mobility  Bed Mobility Overal bed mobility: Needs Assistance Bed Mobility: Rolling;Sidelying to Sit Rolling: Supervision Sidelying to sit: Supervision       General bed mobility comments: verbal cues for technique, use of railing    Transfers Overall transfer level: Needs assistance Equipment used: Rolling walker (2 wheeled) Transfers: Sit to/from Stand Sit to Stand: Min guard         General transfer comment: pt requries verbal and tactile cues to maintain safety to ensure pt reaches destination prior to initiating sitting  Ambulation/Gait Ambulation/Gait assistance: Min guard Gait Distance (Feet): 100 Feet (x2 trials) Assistive device: Rolling walker (2 wheeled) Gait Pattern/deviations: Step-through pattern;Trunk  flexed Gait velocity: reduced Gait velocity interpretation: <1.8 ft/sec, indicate of risk for recurrent falls General Gait Details: pt with slowed step-through gait   Stairs Stairs: Yes Stairs assistance: Min guard Stair Management: One rail Left;Step to pattern;Forwards;Sideways (ascending forwards with hand hold and left rail, descending sideways with bilateral UE support of railing) Number of Stairs: 3     Wheelchair Mobility    Modified Rankin (Stroke Patients Only)       Balance Overall balance assessment: Needs assistance Sitting-balance support: No upper extremity supported;Feet supported Sitting balance-Leahy Scale: Good     Standing balance support: Bilateral upper extremity supported Standing balance-Leahy Scale: Poor Standing balance comment: reliant on UE support of RW                            Cognition Arousal/Alertness: Awake/alert Behavior During Therapy: WFL for tasks assessed/performed Overall Cognitive Status: Within Functional Limits for tasks assessed                                        Exercises      General Comments General comments (skin integrity, edema, etc.): pt with 2L [place] running from wall upon PT arrival however pt with 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 Pt sats at 93% at rest. Pt desats to 88% when ambulating on room air, requests 3L [place] for rest of activity. Pt sats remain stable on 3L [place].      Pertinent Vitals/Pain Pain Assessment: Faces Faces Pain Scale: Hurts a little bit Pain Location: general Pain Descriptors /  Indicators: Grimacing Pain Intervention(s): Monitored during session    Home Living                      Prior Function            PT Goals (current goals can now be found in the care plan section) Acute Rehab PT Goals Patient Stated Goal: get stronger; return to prior level of function Progress towards PT goals: Progressing toward goals    Frequency    Min 3X/week       PT Plan Current plan remains appropriate    Co-evaluation              AM-PAC PT "6 Clicks" Mobility   Outcome Measure  Help needed turning from your back to your side while in a flat bed without using bedrails?: A Little Help needed moving from lying on your back to sitting on the side of a flat bed without using bedrails?: A Little Help needed moving to and from a bed to a chair (including a wheelchair)?: A Little Help needed standing up from a chair using your arms (e.g., wheelchair or bedside chair)?: A Little Help needed to walk in hospital room?: A Little Help needed climbing 3-5 steps with a railing? : A Little 6 Click Score: 18    End of Session Equipment Utilized During Treatment: Oxygen Activity Tolerance: Patient limited by fatigue Patient left: in chair;with call bell/phone within reach;with family/visitor present Nurse Communication: Mobility status PT Visit Diagnosis: Unsteadiness on feet (R26.81);Muscle weakness (generalized) (M62.81)     Time: 1225-1300 PT Time Calculation (min) (ACUTE ONLY): 35 min  Charges:  $Gait Training: 23-37 mins                     Zenaida Niece, PT, DPT Acute Rehabilitation Pager: [phone]    Zenaida Niece 06/05/2021, 1:52 PM

## 2021-06-05 NOTE — Discharge Summary (Signed)
Physician Discharge Summary  [name] [name] AJG:[id number] DOB: 1941-07-29 DOA: 05/22/2021  PCP: Claretta Fraise, MD  Admit date: 05/22/2021 Discharge date: 06/05/2021  Admitted From: home Discharge disposition: CIR   Code Status: Full Code   Discharge Diagnosis:   Principal Problem:   Acute kidney injury superimposed on CKD [facility]) Active Problems:   Essential hypertension   Normocytic anemia   Chronic systolic CHF (congestive heart failure) ([place])   Asthma with COPD ([place])   Hyperlipemia   Coronary atherosclerosis of native coronary artery   Hypothyroidism   ICD (implantable cardioverter-defibrillator) in place   Hyperkalemia   Hyperphosphatemia   Hyponatremia   Hypoalbuminemia   Hypoglycemia due to insulin   Chief complaint: abnormal labs  Brief narrative: [name] [name] is a 80 y.o. male with PMH significant for CAD s/p CABG, atrial fibrillation, CHF, AICD, COPD, CKD stage IIIb. Patient presented to the ED on 8/19 from his PCPs office for abnormal labs He had elevated potassium and creatinine. CT abdomen and pelvis showed mild right hydronephrosis. Admitted to hospitalist service.   Nephrology and urology were consulted.   His potassium and creatinine did not improve with conservative measures and was initiated in dialysis. Is hospitalized and was complicated by bleeding from the dialysis access site, hematuria because of which Coumadin was dose adjusted.  Currently stable clinically. . Subjective: Patient was seen and examined this morning.  Elderly Caucasian male.  Lying down in bed. Not in distress.  No bleeding from dialysis access site today.  Urobag with clear urine. Wife not at bedside today.  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 -Creatinine did not improve with  conservative measures.  Patient was hence initiated in dialysis. -8/24, patient underwent triple-lumen catheter placement and started on dialysis. -8/30, tunneled dialysis catheter placed. -No longer bleeding from dialysis access site.  Nephrology following.  Bleeding from multiple sources -resolved -During this hospitalization, patient has had episodes of bleeding from dialysis catheter access site.  Patient has had hematuria as well.  Coumadin was held.  He also needed desmopressin and vitamin K.  Currently no bleeding.  Pharmacy following.  Target INR 2-3. Recent Labs  Lab 06/01/21 0357 06/02/21 0422 06/03/21 0331 06/04/21 0353 06/05/21 0441  INR 1.7* 1.8* 2.3* 2.9* 2.6*  Hematuria -Prior to admission, CT renal study from 8/10 had shown mild hydronephrosis on the right with gradual tapering of the proximal right ureter without a visible cause of hydronephrosis.   -Patient started bleeding this hospitalization.  Urology commended outpatient follow-up.  Bleeding improved with INR correction.  Acute on chronic anemia -Hemoglobin is currently stable between 8 to 9.  Total 3 units of PRBCs transfused this hospitalization.  -Repeat hemoglobin tomorrow. Recent Labs    10/23/20 1200 05/13/21 1325 05/24/21 0121 05/25/21 0729 05/31/21 0342 06/01/21 1429 06/02/21 0422 06/03/21 0331 06/04/21 0353  HGB  --    < > 7.0*   < > 6.9* 8.7* 8.2* 8.2* 8.1*  MCV  --    < > 94.0   < > 98.2 95.3 94.8 96.6 96.6  VITAMINB12 464  --   --   --   --   --   --   --   --   FERRITIN  --   --  129  --   --   --   --   --   --  TIBC  --   --  189*  --   --   --   --   --   --   IRON  --   --  44*  --   --   --   --   --   --    < > = values in this interval not displayed.   Intermittent hypoglycemia -No history of diabetes mellitus.  A1c 5.4 on 8/20. -Currently not on antidiabetic regimen. -Blood sugar level low intermittently low.  Encourage oral appetite. Recent Labs  Lab 06/04/21 1135  06/04/21 1638 06/04/21 2026 06/05/21 0612 06/05/21 1201  GLUCAP 75 105* 104* 99 87   History of COPD/asthma Chronic respiratory failure with hypoxia -Respiratory status stable at this time.  Chronically on 3 L oxygen by nasal cannula. -Continue DuoNeb, Pulmicort and albuterol  CHF Essential hypertension -Currently on Coreg 3.125 mg twice daily and amlodipine 10 mg daily.  Blood pressure mostly elevated in 140s. -Continue to hold hydralazine, Entresto and spironolactone.  A. Fib -Heart rate controlled on Coreg.  Plan for Coumadin as above.  CAD s/p AICD: -Continue Coreg and statin. Last device check was 04/02/21.  Carotid artery stenosis -Follows with vascular surgery.   Hyperkalemia/hyperphosphatemia -Corrected after dialysis was initiated.  Moderate protein calorie malnutrition Hypoalbuminemia -Continue protein supplements.  Hypothyroidism -Continue Synthroid   GERD -Continue Protonix   Allergies as of 06/05/2021   No Known Allergies      Medication List     STOP taking these medications    cephALEXin 500 MG capsule Commonly known as: KEFLEX   empagliflozin 10 MG Tabs tablet Commonly known as: Jardiance   Entresto 97-103 MG Generic drug: sacubitril-valsartan   fluticasone furoate-vilanterol 200-25 MCG/INH Aepb Commonly known as: BREO ELLIPTA   hydrALAZINE 50 MG tablet Commonly known as: APRESOLINE   HYDROcodone-acetaminophen 5-325 MG tablet Commonly known as: Norco   Hypromell-Glycerin-Naphazoline 0.8-0.25-0.012 % Soln   lovastatin 40 MG tablet Commonly known as: MEVACOR   spironolactone 25 MG tablet Commonly known as: ALDACTONE   sucralfate 1 g tablet Commonly known as: CARAFATE       TAKE these medications    acetaminophen 500 MG tablet Commonly known as: TYLENOL Take 500 mg by mouth every 6 (six) hours as needed for headache or mild pain.   albuterol 108 (90 Base) MCG/ACT inhaler Commonly known as: VENTOLIN HFA USE 2  INHALATIONS BY MOUTH  EVERY 6 HOURS AS NEEDED FOR SHORTNESS OF BREATH What changed: See the new instructions.   amLODipine 10 MG tablet Commonly known as: NORVASC Take 1 tablet (10 mg total) by mouth daily. Start taking on: June 06, 2021   budesonide 0.25 MG/2ML nebulizer solution Commonly known as: PULMICORT USE 1 VIAL  IN  NEBULIZER TWICE  DAILY - rinse mouth after treatment What changed: See the new instructions.   carvedilol 3.125 MG tablet Commonly known as: COREG Take 1 tablet (3.125 mg total) by mouth 2 (two) times daily with a meal. What changed:  medication strength how much to take when to take this   dextromethorphan-guaiFENesin 30-600 MG 12hr tablet Commonly known as: MUCINEX DM Take 1 tablet by mouth 2 (two) times daily as needed for cough.   docusate sodium 100 MG capsule Commonly known as: COLACE Take 100 mg by mouth daily as needed for mild constipation.   ferrous sulfate 325 (65 FE) MG tablet Take 325 mg by mouth daily with breakfast.   ipratropium-albuterol 0.5-2.5 (3) MG/3ML Soln Commonly known as:  DUONEB USE 1 VIAL IN NEBULIZER 4 TIMES DAILY What changed: See the new instructions.   levothyroxine 112 MCG tablet Commonly known as: SYNTHROID TAKE 1 TABLET BY MOUTH  DAILY BEFORE BREAKFAST   loratadine 10 MG tablet Commonly known as: CLARITIN Take 10 mg by mouth daily as needed (for allergies.).   multivitamins ther. w/minerals Tabs tablet Take 1 tablet by mouth daily.   nitroGLYCERIN 0.4 MG SL tablet Commonly known as: NITROSTAT Place 1 tablet (0.4 mg total) under the tongue every 5 (five) minutes as needed for chest pain.   OXYGEN Inhale 2 L/min into the lungs See admin instructions. 2 L/min at bedtime and as needed daily as needed for shortness of breath   pantoprazole 40 MG tablet Commonly known as: PROTONIX Take 1 tablet (40 mg total) by mouth 2 (two) times daily. What changed: when to take this   senna-docusate 8.6-50 MG  tablet Commonly known as: Senokot-S Take 1 tablet by mouth at bedtime as needed for moderate constipation.   traZODone 50 MG tablet Commonly known as: DESYREL Take 1 tablet (50 mg total) by mouth at bedtime as needed for sleep.   Vitamin D3 125 MCG (5000 UT) Caps Take 5,000 Units by mouth daily.   warfarin 2.5 MG tablet Commonly known as: COUMADIN Take as directed. If you are unsure how to take this medication, talk to your nurse or doctor. Original instructions: Take 1 tablet (2.5 mg total) by mouth daily. What changed:  medication strength how much to take how to take this when to take this additional instructions               Discharge Care Instructions  (From admission, onward)           Start     Ordered   06/05/21 0000  No dressing needed        06/05/21 1355            Discharge Instructions:  Diet Recommendation:  Renal diet   Follow with Primary MD Claretta Fraise, MD in 7 days   Get CBC/BMP checked in next visit within 1 week by PCP or SNF MD ( we routinely change or add medications that can affect your baseline labs and fluid status, therefore we recommend that you get the mentioned basic workup next visit with your PCP, your PCP may decide not to get them or add new tests based on their clinical decision)  On your next visit with your PCP, please Get Medicines reviewed and adjusted.  Please request your PCP  to go over all Hospital Tests and Procedure/Radiological results at the follow up, please get all Hospital records sent to your Prim MD by signing hospital release before you go home.  Activity: As tolerated with Full fall precautions use walker/cane & assistance as needed  For Heart failure patients - Check your Weight same time everyday, if you gain over 2 pounds, or you develop in leg swelling, experience more shortness of breath or chest pain, call your Primary MD immediately. Follow Cardiac Low Salt Diet and 1.5 lit/day fluid  restriction.  If you have smoked or chewed Tobacco in the last 2 yrs please stop smoking, stop any regular Alcohol  and or any Recreational drug use.  If you experience worsening of your admission symptoms, develop shortness of breath, life threatening emergency, suicidal or homicidal thoughts you must seek medical attention immediately by calling 911 or calling your MD immediately  if symptoms less severe.  You  Must read complete instructions/literature along with all the possible adverse reactions/side effects for all the Medicines you take and that have been prescribed to you. Take any new Medicines after you have completely understood and accpet all the possible adverse reactions/side effects.   Do not drive, operate heavy machinery, perform activities at heights, swimming or participation in water activities or provide baby sitting services if your were admitted for syncope or siezures until you have seen by Primary MD or a Neurologist and advised to do so again.  Do not drive when taking Pain medications.  Do not take more than prescribed Pain, Sleep and Anxiety Medications  Wear Seat belts while driving.   Please note You were cared for by a hospitalist during your hospital stay. If you have any questions about your discharge medications or the care you received while you were in the hospital after you are discharged, you can call the unit and asked to speak with the hospitalist on call if the hospitalist that took care of you is not available. Once you are discharged, your primary care physician will handle any further medical issues. Please note that NO REFILLS for any discharge medications will be authorized once you are discharged, as it is imperative that you return to your primary care physician (or establish a relationship with a primary care physician if you do not have one) for your aftercare needs so that they can reassess your need for medications and monitor your lab  values.    Follow ups:    Follow-up Information     Claretta Fraise, MD Follow up.   Specialty: Family Medicine Contact information: [place] [address] [phone]         Larey Dresser, MD .   Specialty: Cardiology Contact information: [place] [address] [phone]                 Wound care:   Wound / Incision (Open or Dehisced) 01/06/15 Incision - Open Abdomen Right;Upper (Active)  Date First Assessed/Time First Assessed: 01/06/15 2100   Wound Type: Incision - Open  Location: (c) Abdomen  Location Orientation: Right;Upper    Assessments 01/06/2015  9:00 PM 01/07/2015  8:11 AM  Dressing Type Abdominal pads Abdominal pads  Dressing Status Clean;Dry;Intact Clean;Dry;Intact  Site / Wound Assessment Dressing in place / Unable to assess --  Drainage Amount -- None     No Linked orders to display     Incision (Closed) 01/07/15 Abdomen Right;Upper;Lateral (Active)  Date First Assessed/Time First Assessed: 01/07/15 1345   Location: Abdomen  Location Orientation: Right;Upper;Lateral  Present on Admission: No    Assessments 01/07/2015  1:46 PM 01/08/2015 11:21 AM  Dressing Type Adhesive bandage Adhesive bandage  Dressing Dry;Intact;Clean Dry;Intact;Clean  Site / Wound Assessment Dry;Clean --  Margins Attached edges (approximated) --  Closure None --  Drainage Amount None --  Treatment Cleansed --     No Linked orders to display     Incision (Closed) 04/05/17 Chest Left;Upper (Active)  Date First Assessed/Time First Assessed: 04/05/17 1000   Location: Chest  Location Orientation: Left;Upper  Present on Admission: Yes    Assessments 04/05/2017 10:00 AM 04/06/2017  8:02 AM  Dressing Type Gauze (Comment);Transparent dressing Gauze (Comment);Transparent dressing  Dressing Clean;Dry;Intact Clean;Dry;Intact  Dressing Change Frequency PRN PRN  Site / Wound Assessment Dressing in place / Unable to assess Dressing in place / Unable to  assess  Drainage Amount None None  No Linked orders to display     Incision (Closed) 04/05/17 (Active)  Date First Assessed/Time First Assessed: 04/05/17 1945      No assessment data to display     No Linked orders to display    Discharge Exam:   Vitals:   06/04/21 2025 06/05/21 0419 06/05/21 0822 06/05/21 0925  BP: (!) 145/50 (!) 156/56 (!) 148/52   Pulse: (!) 59 (!) 59 60   Resp: 16 16 16    Temp: (!) 97.5 F (36.4 C) 98.2 F (36.8 C) 98.2 F (36.8 C)   TempSrc: Oral Oral Oral   SpO2: 99% 100% 100% 99%  Weight:      Height:        Body mass index is 26.57 kg/m.  General exam: Pleasant, elderly Caucasian male.  Not in distress Skin: No rashes, lesions or ulcers. HEENT: Atraumatic, normocephalic, no obvious bleeding Lungs: Clear to auscultation bilaterally.  Right anterior chest wall has tunneled dialysis catheter site. CVS: Regular rate and rhythm, no murmur GI/Abd soft, nontender, nondistended, bowel sound present CNS: Alert, awake, oriented x3 Psychiatry: Mood appropriate Extremities: No pedal edema, no calf tenderness.  Time coordinating discharge: 35 minutes   The results of significant diagnostics from this hospitalization (including imaging, microbiology, ancillary and laboratory) are listed below for reference.    Procedures and Diagnostic Studies:   US RENAL  Result Date: 05/23/2021 CLINICAL DATA:  "Bladder neck obstruction". EXAM: RENAL / URINARY TRACT ULTRASOUND COMPLETE COMPARISON:  05/13/2021 CT FINDINGS: Right Kidney: Renal measurements: 11.0 by 6.6 x 5.6 cm = volume: 211 mL. No hydronephrosis. A lower pole 1.6 cm markedly hypoechoic to anechoic lesion. Left Kidney: Renal measurements: 8.0 x 3.1 x 2.9 cm = volume: 38.1 mL. Moderately atrophic. No hydronephrosis. Bladder: Appears normal for degree of bladder distention. Other: None. IMPRESSION: 1. Left renal atrophy. 2. Lower pole right renal cyst or minimally complex cyst. Electronically Signed   By:  Abigail Miyamoto M.D.   On: 05/23/2021 08:32     Labs:   Basic Metabolic Panel: Recent Labs  Lab 05/31/21 0342 06/01/21 1429 06/02/21 0422 06/03/21 0331 06/04/21 0353  NA 135 131* 131* 133* 134*  K 3.5 3.4* 3.6 3.8 3.8  CL 99 96* 94* 99 97*  CO2 28 26 25 24 24   GLUCOSE 107* 119* 85 70 65*  BUN 45* 58* 61* 37* 47*  CREATININE 5.78* 6.77* 7.17* 5.16* 5.97*  CALCIUM 7.9* 7.8* 8.3* 7.8* 7.9*  MG  --   --   --   --  2.1  PHOS  --   --  4.8*  --   --    GFR Estimated Creatinine Clearance: 9.7 mL/min (A) (by C-G formula based on SCr of 5.97 mg/dL (H)). Liver Function Tests: Recent Labs  Lab 06/02/21 0422  ALBUMIN 2.2*   No results for input(s): LIPASE, AMYLASE in the last 168 hours. No results for input(s): AMMONIA in the last 168 hours. Coagulation profile Recent Labs  Lab 06/01/21 0357 06/02/21 0422 06/03/21 0331 06/04/21 0353 06/05/21 0441  INR 1.7* 1.8* 2.3* 2.9* 2.6*    CBC: Recent Labs  Lab 05/31/21 0342 06/01/21 1429 06/02/21 0422 06/03/21 0331 06/04/21 0353  WBC 10.0 11.7* 10.8* 9.3 8.8  NEUTROABS 7.7 9.5* 8.1* 6.9 6.5  HGB 6.9* 8.7* 8.2* 8.2* 8.1*  HCT 21.9* 26.4* 25.4* 25.5* 25.2*  MCV 98.2 95.3 94.8 96.6 96.6  PLT 217 247 256 213 241   Cardiac Enzymes: No results for input(s): CKTOTAL, CKMB, CKMBINDEX, TROPONINI  in the last 168 hours. BNP: Invalid input(s): POCBNP CBG: Recent Labs  Lab 06/04/21 1135 06/04/21 1638 06/04/21 2026 06/05/21 0612 06/05/21 1201  GLUCAP 75 105* 104* 99 87   D-Dimer No results for input(s): DDIMER in the last 72 hours. Hgb A1c No results for input(s): HGBA1C in the last 72 hours. Lipid Profile No results for input(s): CHOL, HDL, LDLCALC, TRIG, CHOLHDL, LDLDIRECT in the last 72 hours. Thyroid function studies No results for input(s): TSH, T4TOTAL, T3FREE, THYROIDAB in the last 72 hours.  Invalid input(s): FREET3 Anemia work up No results for input(s): VITAMINB12, FOLATE, FERRITIN, TIBC, IRON, RETICCTPCT in  the last 72 hours. Microbiology No results found for this or any previous visit (from the past 240 hour(s)).   Signed: Terrilee Croak  [facility] 06/05/2021, 1:55 PM

## 2021-06-05 NOTE — Progress Notes (Signed)
Pt admitted to CIR today. Spoke to [facility] admissions to provide update. Pt to have a TTS 12:15 chair time at [facility] ([id number]) at d/c. Will provide update to [facility] admissions and clinic once d/c date is known.  Melven Sartorius  Renal Navigator [phone]

## 2021-06-05 NOTE — Progress Notes (Signed)
Inpatient Rehabilitation Admissions Coordinator   I met at bedside with patient and his wife. His appeal for CIR admit has been overturned and approved. We discussed admit to CIR for 5 to 7 days vs home with [facility]. They would like to discuss further and I will return at 1330 today for their decision. I have updated Ryan with PT.  Danne Baxter, RN, MSN Rehab Admissions Coordinator [phone] 06/05/2021 11:34 AM

## 2021-06-05 NOTE — PMR Pre-admission (Addendum)
PMR Admission Coordinator Pre-Admission Assessment  Patient: [name] [name] is an 80 y.o., male MRN: [id number] DOB: Sep 17, 1941 Height: 5\' 9"  (175.3 cm) Weight: 81.6 kg  Insurance Information HMO: yes    PPO:      PCP:      IPA:      80/20:      OTHER:  PRIMARY: [place] [employer]      Policy#: [id number]      Subscriber: pt CM Name: via expedited appeal department for [employer]/ Initially  denied by [facility] and ap[roved on appeal  Phone#: [phone]     Fax#: [phone] appeals department.  Pre-Cert#: [id number] approved for 7 days. F/u with [employer] phone [phone] and fax [phone]      Employer:  Benefits:  Phone #: [phone]     Name: 9/2 Eff. Date: 10/04/2020     Deduct: none      Out of Pocket Max: $3600      Life Max: none CIR: $295 co pay per day days 1 until 5      SNF: no copay days 1 until 20; $188 co pay per day days 21 until 40; no copy days 41 until 100 Outpatient: $30 copay per visit     Co-Pay: visits per medical neccesity Home Health: 10%      Co-Pay: visits per medical neccesity DME: 80%     Co-Pay: 20% Providers: in network  SECONDARY: none       Financial Counselor:       Phone#:   The Engineer, petroleum" for patients in Inpatient Rehabilitation Facilities with attached "Privacy Act [place] Records" was provided and verbally reviewed with: Patient and Family  Emergency Contact Information Contact Information     Name Relation Home Work Mobile   Mercer Spouse [phone]  [phone]   [name],[name] Daughter [phone]  [phone]       Current Medical History  Patient Admitting Diagnosis: Debility  History of Present Illness: [name] [name] is an 80 year old right-handed male with history of CAD/CABG, atrial fibrillation maintained on Coumadin, diastolic congestive heart failure, AICD, COPD with remote tobacco abuse with supplemental oxygen at night, CKD stage IV.   Presented 05/22/2021 with  noted recent UTI with progressive weakness and fatigue.  Denied any chest pain or shortness of breath.  In the ED blood pressure 181/59, BUN 91, creatinine 10.49 from a recent baseline 2.52, hemoglobin 7.6.  Renal ultrasound showed some left renal atrophy.  Lower pole right renal cyst or minimally complex cyst.  No hydronephrosis.  Renal service consulted for AKI on CKD with hemodialysis initiated currently on a Tuesday Thursday Saturday schedule and latest creatinine 5.97.  IR consulted for [facility] placement.  His chronic Coumadin has been resumed.   Patient's medical record from [facility]  has been reviewed by the rehabilitation admission coordinator and physician.  Past Medical History  Past Medical History:  Diagnosis Date   AICD (automatic cardioverter/defibrillator) present 04/05/2017   Anemia    Arthritis    "hips; back" (07/09/2014)   Asthma    Atrial fibrillation (HCC)    CAD (coronary artery disease)    CHF (congestive heart failure) ([place])    Cholecystitis 11/2013   CKD (chronic kidney disease) 2015   Stage  3.    COPD (chronic obstructive pulmonary disease) (HCC)    on home oxygen, 2 liters at night10/2015   Esophageal reflux    Gallstones    Gastric antral vascular ectasia 2013   Gout  Heme positive stool    Hepatomegaly    Hiatal hernia    History of blood transfusion ~ 2012   "blood count dropped; had to get 3 units"   Hyperlipidemia    Hypothyroidism    Other and unspecified coagulation defects    Personal history of colonic polyps 05/29/2010   TUBULAR ADENOMA   Unspecified essential hypertension     Has the patient had major surgery during 100 days prior to admission? Yes  Family History   family history includes Early death in his brother; Heart attack in his brother; Heart disease in his brother and father; Hypertension in an other family member; Kidney disease in his mother; Leukemia in his mother; Stomach cancer in his sister and sister.  Current  Medications  Current Facility-Administered Medications:    0.9 %  sodium chloride infusion (Manually program via Guardrails IV Fluids), , Intravenous, Once, Opyd, Lavone Neri, MD   acetaminophen (TYLENOL) tablet 650 mg, 650 mg, Oral, Q6H PRN, Amin, Tayte Mcwherter Chirag, MD, 650 mg at 06/05/21 0615   albuterol (PROVENTIL) (2.5 MG/3ML) 0.083% nebulizer solution 2.5 mg, 2.5 mg, Nebulization, Q6H PRN, Amin, Payden Docter Chirag, MD, 2.5 mg at 06/05/21 0412   amLODipine (NORVASC) tablet 10 mg, 10 mg, Oral, Daily, Dahal, Binaya, MD   budesonide (PULMICORT) nebulizer solution 0.25 mg, 0.25 mg, Nebulization, BID, Amin, Myer Bohlman Chirag, MD, 0.25 mg at 06/05/21 0925   carvedilol (COREG) tablet 3.125 mg, 3.125 mg, Oral, BID WC, Dahal, Binaya, MD, 3.125 mg at 06/05/21 0825   Chlorhexidine Gluconate Cloth 2 % PADS 6 each, 6 each, Topical, Daily, Amin, Vergil Burby Chirag, MD, 6 each at 06/04/21 1038   dextromethorphan-guaiFENesin (MUCINEX DM) 30-600 MG per 12 hr tablet 1 tablet, 1 tablet, Oral, BID PRN, Amin, Janeese Mcgloin Chirag, MD   feeding supplement (NEPRO CARB STEADY) liquid 237 mL, 237 mL, Oral, TID BM, Dahal, Binaya, MD, 237 mL at 06/04/21 2125   ferrous sulfate tablet 325 mg, 325 mg, Oral, Q breakfast, Adefeso, Oladapo, DO, 325 mg at 06/05/21 0825   insulin aspart (novoLOG) injection 0-5 Units, 0-5 Units, Subcutaneous, QHS, Adefeso, Oladapo, DO   insulin aspart (novoLOG) injection 0-6 Units, 0-6 Units, Subcutaneous, TID WC, Adefeso, Oladapo, DO   ipratropium-albuterol (DUONEB) 0.5-2.5 (3) MG/3ML nebulizer solution 3 mL, 3 mL, Nebulization, BID, Dahal, Binaya, MD, 3 mL at 06/05/21 0926   levothyroxine (SYNTHROID) tablet 112 mcg, 112 mcg, Oral, Q0600, Amin, Gerhardt Gleed Chirag, MD, 112 mcg at 06/05/21 0610   multivitamin with minerals tablet 1 tablet, 1 tablet, Oral, Daily, Cipriano Bunker, MD, 1 tablet at 06/05/21 0825   ondansetron (ZOFRAN) injection 4 mg, 4 mg, Intravenous, Q6H PRN, Dahal, Binaya, MD   pantoprazole (PROTONIX) EC tablet 40 mg,  40 mg, Oral, BID, Adefeso, Oladapo, DO, 40 mg at 06/05/21 0825   pravastatin (PRAVACHOL) tablet 40 mg, 40 mg, Oral, q1800, Adefeso, Oladapo, DO, 40 mg at 06/04/21 1800   senna-docusate (Senokot-S) tablet 1 tablet, 1 tablet, Oral, QHS PRN, Amin, Maleiya Pergola Chirag, MD, 1 tablet at 06/05/21 0615   sodium chloride flush (NS) 0.9 % injection 10-40 mL, 10-40 mL, Intracatheter, PRN, Dahal, Binaya, MD, 10 mL at 05/28/21 2142   traZODone (DESYREL) tablet 50 mg, 50 mg, Oral, QHS PRN, Amin, Bernard Slayden Chirag, MD, 50 mg at 06/02/21 2108   warfarin (COUMADIN) tablet 2.5 mg, 2.5 mg, Oral, ONCE-1600, Daylene Posey, [facility]   Warfarin - Pharmacist Dosing Inpatient, , Does not apply, q1600, Carrington Clamp, [facility], Given at 06/04/21 1657  Patients Current Diet:  Diet Order             Diet renal with fluid restriction Fluid restriction: 1200 mL Fluid; Room service appropriate? Yes; Fluid consistency: Thin  Diet effective now                   Precautions / Restrictions Precautions Precautions: Fall Restrictions Weight Bearing Restrictions: No   Has the patient had 2 or more falls or a fall with injury in the past year? No  Prior Activity Level Community (5-7x/wk): Independent , active and driving  Prior Functional Level Self Care: Did the patient need help bathing, dressing, using the toilet or eating? Independent  Indoor Mobility: Did the patient need assistance with walking from room to room (with or without device)? Independent  Stairs: Did the patient need assistance with internal or external stairs (with or without device)? Independent  Functional Cognition: Did the patient need help planning regular tasks such as shopping or remembering to take medications? Independent  Patient Information Are you of Hispanic, Latino/a,or Spanish origin?: A. No, not of Hispanic, Latino/a, or Spanish origin What is your race?: A. White Do you need or want an interpreter to communicate with a doctor or health  care staff?: 0. No  Patient's Response To:  Health Literacy and Transportation Is the patient able to respond to health literacy and transportation needs?: Yes Health Literacy - How often do you need to have someone help you when you read instructions, pamphlets, or other written material from your doctor or pharmacy?: Never In the past 12 months, has lack of transportation kept you from medical appointments or from getting medications?: No In the past 12 months, has lack of transportation kept you from meetings, work, or from getting things needed for daily living?: No  Home Assistive Devices / [place] Devices/Equipment: Environmental consultant (specify type) (4 wheel walker) Home Equipment: Walker - 2 wheels, Other (comment), Shower seat, Cane - single point, Hand held shower head  Prior Device Use: Indicate devices/aids used by the patient prior to current illness, exacerbation or injury? None of the above  Current Functional Level Cognition  Overall Cognitive Status: Within Functional Limits for tasks assessed Orientation Level: Oriented X4    Extremity Assessment (includes Sensation/Coordination)  Upper Extremity Assessment: Defer to OT evaluation  Lower Extremity Assessment: Defer to PT evaluation    ADLs  Overall ADL's : Needs assistance/impaired Eating/Feeding: Set up, Sitting Grooming: Set up, Bed level Upper Body Bathing: Minimal assistance, Sitting Lower Body Bathing: Moderate assistance, Sit to/from stand Upper Body Dressing : Minimal assistance, Sitting Lower Body Dressing: Moderate assistance, Sitting/lateral leans Lower Body Dressing Details (indicate cue type and reason): Doffed and donned non-skid socks from eob assistance to thread over feet Toilet Transfer: Minimal assistance (simulated in room) Toilet Transfer Details (indicate cue type and reason): Min A for power up and maintaining balance Functional mobility during ADLs: Minimal assistance (side steps  towards HOB) General ADL Comments: Pt presenting with decreased actiivty tolerance and balance. Fatigues quickly. Reports he didnt sleep well due to pain. Benefits from increased encouragement    Mobility  Overal bed mobility: Needs Assistance Bed Mobility: Supine to Sit Supine to sit: Supervision, HOB elevated Sit to supine: Min assist General bed mobility comments: Difficulty scooting to eob and difficulty getting feet back into bed    Transfers  Overall transfer level: Needs assistance Equipment used: Rolling walker (2 wheeled) Transfers: Sit to/from Stand Sit to Stand: Min guard Stand pivot transfers: Mod assist, +  2 safety/equipment General transfer comment: performed standing from eob with min assist to stand after 3 attempts on his own    Ambulation / Gait / Stairs / Wheelchair Mobility  Ambulation/Gait Ambulation/Gait assistance: Counsellor (Feet): 80 Feet Assistive device: Rolling walker (2 wheeled) Gait Pattern/deviations: Step-through pattern General Gait Details: pt with slowed step-through gait, reduced gait speed Gait velocity: reduced Gait velocity interpretation: <1.8 ft/sec, indicate of risk for recurrent falls    Posture / Balance Balance Overall balance assessment: Needs assistance Sitting-balance support: No upper extremity supported, Feet supported Sitting balance-Leahy Scale: Good Standing balance support: Bilateral upper extremity supported Standing balance-Leahy Scale: Poor Standing balance comment: reliant on UE support of RW    Special needs/care consideration New to hemodialysis this admission   Previous Home Environment  Living Arrangements: Spouse/significant other  Lives With: Spouse Available Help at Discharge: Family, Available 24 hours/day Type of Home: House Home Layout: One level Home Access: Stairs to enter Entrance Stairs-Rails: Left Entrance Stairs-Number of Steps: 3 Bathroom Shower/Tub: Public librarian, Clinical cytogeneticist: Standard Bathroom Accessibility: Yes How Accessible: Accessible via walker Home Care Services: No Additional Comments: Supplemental O2 at night  Discharge Living Setting Plans for Discharge Living Setting: Patient's home, Lives with (comment) (wife) Type of Home at Discharge: House Discharge Home Layout: One level Discharge Home Access: Stairs to enter Entrance Stairs-Rails: Left Entrance Stairs-Number of Steps: 3 Discharge Bathroom Shower/Tub: Tub/shower unit Discharge Bathroom Toilet: Standard Discharge Bathroom Accessibility: Yes How Accessible: Accessible via walker Does the patient have any problems obtaining your medications?: No  Social/Family/Support Systems Patient Roles: Spouse Contact Information: wife, [name] Anticipated Caregiver: wife Anticipated Ambulance person Information: see above Ability/Limitations of Caregiver: none Caregiver Availability: 24/7 Discharge Plan Discussed with Primary Caregiver: Yes Is Caregiver In Agreement with Plan?: Yes Does Caregiver/Family have Issues with Lodging/Transportation while Pt is in Rehab?: No  Goals Patient/Family Goal for Rehab: Mod I to supervision with PT and OT Expected length of stay: ELOS 5 to 7 days Pt/Family Agrees to Admission and willing to participate: Yes Program Orientation Provided & Reviewed with Pt/Caregiver Including Roles  & Responsibilities: Yes  Decrease burden of Care through IP rehab admission: n/a  Possible need for SNF placement upon discharge: not anticipated  Patient Condition: I have reviewed medical records from [facility] , spoken with CM, and patient and spouse. I met with patient at the bedside for inpatient rehabilitation assessment.  Patient will benefit from ongoing PT and OT, can actively participate in 3 hours of therapy a day 5 days of the week, and can make measurable gains during the admission.  Patient will also benefit from the coordinated team  approach during an Inpatient Acute Rehabilitation admission.  The patient will receive intensive therapy as well as Rehabilitation physician, nursing, social worker, and care management interventions.  Due to bladder management, bowel management, safety, skin/wound care, disease management, medication administration, pain management, and patient education the patient requires 24 hour a day rehabilitation nursing.  The patient is currently min assist overall with mobility and basic ADLs.  Discharge setting and therapy post discharge at home with home health is anticipated.  Patient has agreed to participate in the Acute Inpatient Rehabilitation Program and will admit today.  Preadmission Screen Completed By:  Cleatrice Burke, 06/05/2021 1:50 PM ______________________________________________________________________   Discussed status with Dr. Posey Pronto  on 06/05/2021 at 1411 and received approval for admission today.  Admission Coordinator:  Cleatrice Burke, RN, time  [phone]  Date  06/05/2021   Assessment/Plan: Diagnosis: Debility Does the need for close, 24 hr/day Medical supervision in concert with the patient's rehab needs make it unreasonable for this patient to be served in a less intensive setting? Yes Co-Morbidities requiring supervision/potential complications: CAD/CABG, atrial fibrillation maintained on Coumadin, diastolic congestive heart failure, AICD, COPD with remote tobacco abuse with supplemental oxygen at night, CKD stage IV Due to safety, skin/wound care, disease management, pain management, and patient education, does the patient require 24 hr/day rehab nursing? Yes Does the patient require coordinated care of a physician, rehab nurse, PT, OT to address physical and functional deficits in the context of the above medical diagnosis(es)? Yes Addressing deficits in the following areas: balance, endurance, locomotion, strength, transferring, bathing, dressing, toileting, and psychosocial  support Can the patient actively participate in an intensive therapy program of at least 3 hrs of therapy 5 days a week? Yes The potential for patient to make measurable gains while on inpatient rehab is excellent Anticipated functional outcomes upon discharge from inpatient rehab: modified independent PT, modified independent OT, n/a SLP Estimated rehab length of stay to reach the above functional goals is: 4-6 days. Anticipated discharge destination: Home 10. Overall Rehab/Functional Prognosis: excellent   MD Signature: Delice Lesch, MD, ABPMR

## 2021-06-05 NOTE — Progress Notes (Signed)
PROGRESS NOTE  [name] [name]  DOB: 1941/01/11  PCP: Claretta Fraise, MD GHW:[id number]  DOA: 05/22/2021  LOS: 39 days  Hospital Day: 23  Chief complaint: abnormal labs  Brief narrative: [name] [name] is a 80 y.o. male with PMH significant for CAD s/p CABG, atrial fibrillation, CHF, AICD, COPD, CKD stage IIIb. Patient presented to the ED on 8/19 from his PCPs office for abnormal labs He had elevated potassium and creatinine. CT abdomen and pelvis showed mild right hydronephrosis. Admitted to hospitalist service.   Nephrology and urology were consulted.   His potassium and creatinine did not improve with conservative measures and was initiated in dialysis. Is hospitalized and was complicated by bleeding from the dialysis access site, hematuria because of which Coumadin was dose adjusted.  Currently stable clinically. Regarding disposition, CIR was recommended but insurance denied.  Peer to [place].  Patient's family appealed.  Currently awaiting decision.  Subjective: Patient was seen and examined this morning.  Elderly Caucasian male.  Lying down in bed. Not in distress.  No bleeding from dialysis access site today.  Urobag with clear urine. Wife not at bedside today.  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 -Creatinine did not improve with conservative measures.  Patient was hence initiated in dialysis. -8/24, patient underwent triple-lumen catheter placement and started on dialysis. -8/30, tunneled dialysis catheter placed. -No longer bleeding from dialysis access site.  Nephrology following.  Bleeding from multiple sources -resolved -During this hospitalization, patient has had episodes of bleeding from dialysis catheter access site.  Patient has had hematuria as well.  Coumadin  was held.  He also needed desmopressin and vitamin K.  Currently no bleeding.  Pharmacy following.  Target INR 2-3. Recent Labs  Lab 06/01/21 0357 06/02/21 0422 06/03/21 0331 06/04/21 0353 06/05/21 0441  INR 1.7* 1.8* 2.3* 2.9* 2.6*  Hematuria -Prior to admission, CT renal study from 8/10 had shown mild hydronephrosis on the right with gradual tapering of the proximal right ureter without a visible cause of hydronephrosis.   -Patient started bleeding this hospitalization.  Urology commended outpatient follow-up.  Bleeding improved with INR correction.  Acute on chronic anemia -Hemoglobin is currently stable between 8 to 9.  Total 3 units of PRBCs transfused this hospitalization.  -Repeat hemoglobin tomorrow. Recent Labs    10/23/20 1200 05/13/21 1325 05/24/21 0121 05/25/21 0729 05/31/21 0342 06/01/21 1429 06/02/21 0422 06/03/21 0331 06/04/21 0353  HGB  --    < > 7.0*   < > 6.9* 8.7* 8.2* 8.2* 8.1*  MCV  --    < > 94.0   < > 98.2 95.3 94.8 96.6 96.6  VITAMINB12 464  --   --   --   --   --   --   --   --   FERRITIN  --   --  129  --   --   --   --   --   --   TIBC  --   --  189*  --   --   --   --   --   --   IRON  --   --  44*  --   --   --   --   --   --    < > = values in this interval not displayed.   Intermittent hypoglycemia -No history  of diabetes mellitus.  A1c 5.4 on 8/20. -Currently not on antidiabetic regimen. -Blood sugar level low intermittently low.  Encourage oral appetite. Recent Labs  Lab 06/04/21 0638 06/04/21 1135 06/04/21 1638 06/04/21 2026 06/05/21 0612  GLUCAP 68* 75 105* 104* 99   History of COPD/asthma Chronic respiratory failure with hypoxia -Respiratory status stable at this time.  Chronically on 3 L oxygen by nasal cannula. -Continue DuoNeb, Pulmicort and albuterol  CHF Essential hypertension -Currently on Coreg 3.125 mg twice daily and amlodipine 10 mg daily.  Blood pressure mostly elevated in 140s. -Continue to hold hydralazine,  Entresto and spironolactone.  A. Fib -Heart rate controlled on Coreg.  Plan for Coumadin as above.  CAD s/p AICD: -Continue Coreg and statin. Last device check was 04/02/21.  Carotid artery stenosis -Follows with vascular surgery.   Hyperkalemia/hyperphosphatemia -Corrected after dialysis was initiated.  Moderate protein calorie malnutrition Hypoalbuminemia -Continue protein supplements.  Hypothyroidism -Continue Synthroid   GERD -Continue Protonix    Mobility: Encourage ambulation.  PT eval appreciated.   Code Status:   Code Status: Full Code  Nutritional status: Body mass index is 26.57 kg/m. Nutrition Problem: Increased nutrient needs Etiology: acute illness (AKI) Signs/Symptoms: estimated needs Diet:  Diet Order             Diet renal with fluid restriction Fluid restriction: 1200 mL Fluid; Room service appropriate? Yes; Fluid consistency: Thin  Diet effective now                  DVT prophylaxis:  SCDs Start: 05/23/21 6629   Antimicrobials: None Fluid: None Consultants: Nephrology Family Communication: Wife not at bedside today Status is: Inpatient  Remains inpatient appropriate because: Pending appeal for CIR placement. Dispo: The patient is from: Home              Anticipated d/c is to: CIR versus SNF              Patient currently is medically stable to d/c.   Difficult to place patient No    Infusions:      Scheduled Meds:  sodium chloride   Intravenous Once   amLODipine  10 mg Oral Daily   budesonide  0.25 mg Nebulization BID   carvedilol  3.125 mg Oral BID WC   Chlorhexidine Gluconate Cloth  6 each Topical Daily   feeding supplement (NEPRO CARB STEADY)  237 mL Oral TID BM   ferrous sulfate  325 mg Oral Q breakfast   insulin aspart  0-5 Units Subcutaneous QHS   insulin aspart  0-6 Units Subcutaneous TID WC   ipratropium-albuterol  3 mL Nebulization BID   levothyroxine  112 mcg Oral Q0600   multivitamin with minerals  1 tablet Oral  Daily   pantoprazole  40 mg Oral BID   pravastatin  40 mg Oral q1800   Warfarin - Pharmacist Dosing Inpatient   Does not apply q1600    Antimicrobials: Anti-infectives (From admission, onward)    Start     Dose/Rate Route Frequency Ordered Stop   06/02/21 0000  ceFAZolin (ANCEF) IVPB 2g/100 mL premix        2 g 200 mL/hr over 30 Minutes Intravenous To Radiology 06/01/21 1415 06/02/21 1617       PRN meds: acetaminophen, albuterol, dextromethorphan-guaiFENesin, ondansetron (ZOFRAN) IV, senna-docusate, sodium chloride flush, traZODone   Objective: Vitals:   06/05/21 0822 06/05/21 0925  BP: (!) 148/52   Pulse: 60   Resp: 16   Temp: 98.2 F (36.8  C)   SpO2: 100% 99%    Intake/Output Summary (Last 24 hours) at 06/05/2021 1045 Last data filed at 06/04/2021 1055 Gross per 24 hour  Intake --  Output 1000 ml  Net -1000 ml   Filed Weights   06/02/21 2100 06/04/21 0431 06/04/21 1107  Weight: 83.9 kg 82.6 kg 81.6 kg   Weight change: -1 kg Body mass index is 26.57 kg/m.   Physical Exam: General exam: Pleasant, elderly Caucasian male.  Not in distress.  On 3 L oxygen by nasal cannula.  skin: No rashes, lesions or ulcers. HEENT: Atraumatic, normocephalic, no obvious bleeding Lungs: Clear to auscultation bilaterally Chest wall: Right anterior chest wall has tunneled catheter.  Insertion site without bleeding today CVS: Regular rate and rhythm, no murmur GI/Abd soft, nontender, nondistended, bowel sound present CNS: Alert, awake, slow to respond, oriented to place and person Psychiatry: Depressed look Extremities: No pedal edema, no calf tenderness  Data Review: I have personally reviewed the laboratory data and studies available.  Recent Labs  Lab 05/31/21 0342 06/01/21 1429 06/02/21 0422 06/03/21 0331 06/04/21 0353  WBC 10.0 11.7* 10.8* 9.3 8.8  NEUTROABS 7.7 9.5* 8.1* 6.9 6.5  HGB 6.9* 8.7* 8.2* 8.2* 8.1*  HCT 21.9* 26.4* 25.4* 25.5* 25.2*  MCV 98.2 95.3 94.8 96.6  96.6  PLT 217 247 256 213 241   Recent Labs  Lab 05/31/21 0342 06/01/21 1429 06/02/21 0422 06/03/21 0331 06/04/21 0353  NA 135 131* 131* 133* 134*  K 3.5 3.4* 3.6 3.8 3.8  CL 99 96* 94* 99 97*  CO2 28 26 25 24 24   GLUCOSE 107* 119* 85 70 65*  BUN 45* 58* 61* 37* 47*  CREATININE 5.78* 6.77* 7.17* 5.16* 5.97*  CALCIUM 7.9* 7.8* 8.3* 7.8* 7.9*  MG  --   --   --   --  2.1  PHOS  --   --  4.8*  --   --     F/u labs ordered Unresulted Labs (From admission, onward)     Start     Ordered   06/06/21 0500  CBC with Differential/Platelet  Daily,   R     Question:  Specimen collection method  Answer:  Lab=Lab collect   06/05/21 1042   06/06/21 7867  Basic metabolic panel  Daily,   R     Question:  Specimen collection method  Answer:  Lab=Lab collect   06/05/21 1042   05/25/21 0500  Protime-INR  Daily,   R      05/24/21 0816   Signed and Held  Renal function panel  Once,   R       Question:  Specimen collection method  Answer:  Lab=Lab collect   Signed and Held   Signed and Held  CBC  Once,   R       Question:  Specimen collection method  Answer:  Lab=Lab collect   Signed and Held            Signed, Terrilee Croak, MD [facility] 06/05/2021

## 2021-06-06 DIAGNOSIS — R5381 Other malaise: Secondary | ICD-10-CM | POA: Diagnosis not present

## 2021-06-06 LAB — CBC
HCT: 27.5 % — ABNORMAL LOW (ref 39.0–52.0)
Hemoglobin: 8.7 g/dL — ABNORMAL LOW (ref 13.0–17.0)
MCH: 30.7 pg (ref 26.0–34.0)
MCHC: 31.6 g/dL (ref 30.0–36.0)
MCV: 97.2 fL (ref 80.0–100.0)
Platelets: 249 10*3/uL (ref 150–400)
RBC: 2.83 MIL/uL — ABNORMAL LOW (ref 4.22–5.81)
RDW: 13.8 % (ref 11.5–15.5)
WBC: 10.4 10*3/uL (ref 4.0–10.5)
nRBC: 0 % (ref 0.0–0.2)

## 2021-06-06 LAB — GLUCOSE, CAPILLARY
Glucose-Capillary: 83 mg/dL (ref 70–99)
Glucose-Capillary: 88 mg/dL (ref 70–99)

## 2021-06-06 LAB — RENAL FUNCTION PANEL
Albumin: 2.4 g/dL — ABNORMAL LOW (ref 3.5–5.0)
Anion gap: 8 (ref 5–15)
BUN: 33 mg/dL — ABNORMAL HIGH (ref 8–23)
CO2: 27 mmol/L (ref 22–32)
Calcium: 8.2 mg/dL — ABNORMAL LOW (ref 8.9–10.3)
Chloride: 97 mmol/L — ABNORMAL LOW (ref 98–111)
Creatinine, Ser: 4.8 mg/dL — ABNORMAL HIGH (ref 0.61–1.24)
GFR, Estimated: 12 mL/min — ABNORMAL LOW (ref >60)
Glucose, Bld: 104 mg/dL — ABNORMAL HIGH (ref 70–99)
Phosphorus: 3.2 mg/dL (ref 2.5–4.6)
Potassium: 3.9 mmol/L (ref 3.5–5.1)
Sodium: 132 mmol/L — ABNORMAL LOW (ref 135–145)

## 2021-06-06 LAB — PROTIME-INR
INR: 2 — ABNORMAL HIGH (ref 0.8–1.2)
Prothrombin Time: 22.4 seconds — ABNORMAL HIGH (ref 11.4–15.2)

## 2021-06-06 MED ORDER — HEPARIN SODIUM (PORCINE) 1000 UNIT/ML IJ SOLN
INTRAMUSCULAR | Status: AC
  Filled 2021-06-06: qty 1

## 2021-06-06 MED ORDER — RENA-VITE PO TABS
1.0000 | ORAL_TABLET | Freq: Every day | ORAL | Status: DC
  Administered 2021-06-06 – 2021-06-12 (×7): 1 via ORAL
  Filled 2021-06-06 (×8): qty 1

## 2021-06-06 NOTE — Progress Notes (Signed)
Patient ID: [name] [name], male   DOB: 1941/07/22, 80 y.o.   MRN: [id number] S: no acute events. Still making some urine. No complaints. Open for HD later today O:BP (!) 166/59 (BP Location: Left Arm)   Pulse 60   Temp 98.4 F (36.9 C) (Oral)   Resp 17   Ht 5\' 7"  (1.702 m)   Wt 81 kg   SpO2 94%   BMI 27.97 kg/m   Intake/Output Summary (Last 24 hours) at 06/06/2021 1118 Last data filed at 06/06/2021 0742 Gross per 24 hour  Intake 297 ml  Output 350 ml  Net -53 ml    Intake/Output: I/O last 3 completed shifts: In: 120 [P.O.:120] Out: 350 [Urine:350]  Intake/Output this shift:  Total I/O In: 177 [P.O.:177] Out: -  Weight change:  Gen:NAD CVS: RRR Resp: CTA Abd: +BS, soft, NT/ND Ext:1+ pedal edema Neuro: awake  Recent Labs  Lab 05/31/21 0342 06/01/21 1429 06/02/21 0422 06/03/21 0331 06/04/21 0353 06/05/21 1608  NA 135 131* 131* 133* 134* 133*  K 3.5 3.4* 3.6 3.8 3.8 3.8  CL 99 96* 94* 99 97* 98  CO2 28 26 25 24 24 27   GLUCOSE 107* 119* 85 70 65* 98  BUN 45* 58* 61* 37* 47* 27*  CREATININE 5.78* 6.77* 7.17* 5.16* 5.97* 4.41*  ALBUMIN  --   --  2.2*  --   --  2.3*  CALCIUM 7.9* 7.8* 8.3* 7.8* 7.9* 8.0*  PHOS  --   --  4.8*  --   --  2.8   Liver Function Tests: Recent Labs  Lab 06/02/21 0422 06/05/21 1608  ALBUMIN 2.2* 2.3*   No results for input(s): LIPASE, AMYLASE in the last 168 hours. No results for input(s): AMMONIA in the last 168 hours. CBC: Recent Labs  Lab 06/01/21 1429 06/02/21 0422 06/03/21 0331 06/04/21 0353 06/05/21 1608  WBC 11.7* 10.8* 9.3 8.8 9.0  NEUTROABS 9.5* 8.1* 6.9 6.5  --   HGB 8.7* 8.2* 8.2* 8.1* 8.7*  HCT 26.4* 25.4* 25.5* 25.2* 26.8*  MCV 95.3 94.8 96.6 96.6 95.7  PLT 247 256 213 241 237   Cardiac Enzymes: No results for input(s): CKTOTAL, CKMB, CKMBINDEX, TROPONINI in the last 168 hours. CBG: Recent Labs  Lab 06/05/21 0612 06/05/21 1201 06/05/21 1651 06/05/21 2105 06/06/21 0550  GLUCAP 99 87 94 89 83    Iron  Studies: No results for input(s): IRON, TIBC, TRANSFERRIN, FERRITIN in the last 72 hours. Studies/Results: No results found.  amLODipine  10 mg Oral Daily   budesonide  0.25 mg Nebulization BID   carvedilol  3.125 mg Oral BID WC   feeding supplement (NEPRO CARB STEADY)  237 mL Oral TID BM   ferrous sulfate  325 mg Oral Q breakfast   ipratropium-albuterol  3 mL Nebulization BID   levothyroxine  112 mcg Oral Q0600   multivitamin with minerals  1 tablet Oral Daily   pantoprazole  40 mg Oral BID   pravastatin  40 mg Oral q1800   warfarin  2.5 mg Oral ONCE-1600   Warfarin - Pharmacist Dosing Inpatient   Does not apply q1600    BMET    Component Value Date/Time   NA 133 (L) 06/05/2021 1608   NA 145 (H) 01/06/2021 1031   K 3.8 06/05/2021 1608   CL 98 06/05/2021 1608   CO2 27 06/05/2021 1608   GLUCOSE 98 06/05/2021 1608   BUN 27 (H) 06/05/2021 1608   BUN 27 01/06/2021 1031   CREATININE  4.41 (H) 06/05/2021 1608   CREATININE 1.31 (H) 07/14/2016 0915   CALCIUM 8.0 (L) 06/05/2021 1608   GFRNONAA 13 (L) 06/05/2021 1608   GFRAA 34 (L) 09/09/2020 0930   CBC    Component Value Date/Time   WBC 9.0 06/05/2021 1608   RBC 2.80 (L) 06/05/2021 1608   HGB 8.7 (L) 06/05/2021 1608   HGB 10.5 (L) 09/09/2020 0930   HCT 26.8 (L) 06/05/2021 1608   HCT 31.6 (L) 09/09/2020 0930   PLT 237 06/05/2021 1608   PLT 231 09/09/2020 0930   MCV 95.7 06/05/2021 1608   MCV 97 09/09/2020 0930   MCH 31.1 06/05/2021 1608   MCHC 32.5 06/05/2021 1608   RDW 13.9 06/05/2021 1608   RDW 12.0 09/09/2020 0930   LYMPHSABS 0.6 (L) 06/04/2021 0353   LYMPHSABS 1.1 09/09/2020 0930   MONOABS 1.2 (H) 06/04/2021 0353   EOSABS 0.4 06/04/2021 0353   EOSABS 0.3 09/09/2020 0930   BASOSABS 0.1 06/04/2021 0353   BASOSABS 0.1 09/09/2020 0930    Assessment/Plan:   AKI/CKD stage IV, oliguric - presumably due to ischemic ATN due to volume depletion and concomitant use of ARB, aldactone, and jardiance.  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  Started on HD 05/28/21 with temp HD catheter. Continue on TTS schedule for now and follow for renal recovery.  S/p TDC placement 06/02/21 w/ IR No signs of renal recovery, will need outpatient HD for AKI. 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 ABLA and anemia of CKD stage IV - transfuse prn.  No ESA due to pelvic mass. A fib - coumadin CKD-BMD - stable COPD - pt with bilateral wheezing, nebs per primary svc. Disposition - CIR  Gean Quint, MD [facility]

## 2021-06-06 NOTE — Progress Notes (Signed)
Initial Nutrition Assessment  DOCUMENTATION CODES:  Not applicable  INTERVENTION:   Recommend discontinuing mvi with minerals and ordering rena-vite daily   -Continue Nepro Shake po TID, each supplement provides 425 kcal and 19 grams protein  NUTRITION DIAGNOSIS:  Increased nutrient needs related to other (see comment) (therapies) as evidenced by estimated needs.  GOAL:  Patient will meet greater than or equal to 90% of their needs  MONITOR:  PO intake, Supplement acceptance, Weight trends, Labs, I & O's  REASON FOR ASSESSMENT:  Malnutrition Screening Tool    ASSESSMENT:  Pt with PMH significant for CAD/CABG, Afib, CHF, AICD, COPD, CKD stage IV presented on 05/22/2021 with recent UTI and progressive weakness and fatigue. Renal ultrasound showed some L renal atrophy; Nephrology consulted for severe AKI on CKD w/ initiation of HD. Pt admitted to CIR on 06/05/21.  Pt unavailable at time of RD visit, in HD per RN. Pt will need outpt HD arrangements.   Reviewed weight history. Weight stable over last 4 months.   PO Intake: 0-50% x2 recorded meals   Medications: Nepro shake TID, ferrous sulfate, mvi with minerals, protonix, coumadin Labs: Na 132 (L), BUN 33 (H), Cr 4.80 (H) CBGs: 83-99 x24 hours  UOP: 371ml x24 hours I/O: -28ml since admit  Per RN edema assessment, pt with moderate pitting edema to RUE and mild pitting edema to LUE and BLE.   NUTRITION - FOCUSED PHYSICAL EXAM: Unable to perform at this time; will attempt at follow-up.   Diet Order:   Diet Order             Diet renal with fluid restriction Fluid restriction: 1200 mL Fluid; Room service appropriate? Yes; Fluid consistency: Thin  Diet effective now                  EDUCATION NEEDS:  No education needs have been identified at this time  Skin:  Skin Assessment: Reviewed RN Assessment  Last BM:  8/31  Height:  Ht Readings from Last 1 Encounters:  06/05/21 5\' 7"  (1.702 m)   Weight:  Wt Readings  from Last 10 Encounters:  06/06/21 81 kg  06/04/21 81.6 kg  05/21/21 77.1 kg  05/18/21 78.9 kg  05/15/21 77.1 kg  05/13/21 76 kg  04/28/21 76.7 kg  03/24/21 78 kg  02/24/21 78.5 kg  02/09/21 78.3 kg   BMI:  Body mass index is 27.97 kg/m.  Estimated Nutritional Needs:  Kcal:  2000-2200 Protein:  100-110 grams Fluid:  1L+UOP    Larkin Ina, MS, RD, LDN (she/her/hers) RD pager number and weekend/on-call pager number located in [place].

## 2021-06-06 NOTE — Evaluation (Signed)
Occupational Therapy Assessment and Plan  Patient Details  Name: [name] [name] MRN: [id number] Date of Birth: 01/30/1941  OT Diagnosis: muscular wasting and disuse atrophy and muscle weakness (generalized) Rehab Potential: Rehab Potential (ACUTE ONLY): Fair ELOS: 7 days   Today's Date: 06/06/2021 OT Individual Time: 1005-1030 OT Individual Time Calculation (min): 25 min  and Today's Date: 06/06/2021 OT Missed Time: 20 Minutes Missed Time Reason: Patient unwilling/refused to participate without medical reason;Patient fatigue    Hospital Problem: Principal Problem:   Debility   Past Medical History:  Past Medical History:  Diagnosis Date   AICD (automatic cardioverter/defibrillator) present 04/05/2017   Anemia    Arthritis    "hips; back" (07/09/2014)   Asthma    Atrial fibrillation (HCC)    CAD (coronary artery disease)    CHF (congestive heart failure) ([place])    Cholecystitis 11/2013   CKD (chronic kidney disease) 2015   Stage  3.    COPD (chronic obstructive pulmonary disease) (HCC)    on home oxygen, 2 liters at night10/2015   Esophageal reflux    Gallstones    Gastric antral vascular ectasia 2013   Gout    Heme positive stool    Hepatomegaly    Hiatal hernia    History of blood transfusion ~ 2012   "blood count dropped; had to get 3 units"   Hyperlipidemia    Hypothyroidism    Other and unspecified coagulation defects    Personal history of colonic polyps 05/29/2010   TUBULAR ADENOMA   Unspecified essential hypertension    Past Surgical History:  Past Surgical History:  Procedure Laterality Date   CARDIAC CATHETERIZATION N/A 07/15/2016   Procedure: Right/Left Heart Cath and Coronary/Graft Angiography;  Surgeon: Belva Crome, MD;  Location: [facility];  Service: Cardiovascular;  Laterality: N/A;   cholecystomy tube  12/28/2014 - 01/06/2015   tube clogged with debris, removed and IR unable to place new tube.    COLONOSCOPY  2013   Dr. Sharlett Iles: no polyps  or evidence of active bleeding   CORONARY ANGIOPLASTY WITH STENT PLACEMENT  ~ 2008; 07/08/2014   "1 + 3"   CORONARY ARTERY BYPASS GRAFT  1998   CABG X4   ESOPHAGOGASTRODUODENOSCOPY  2013   Dr. 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 Sharlett Iles: AVM at 38 and blood at 30 minutes beyond first duodenal image but not actual lesion seen. If persistent IDA, bleeding, recommend enteroscopy with ablation    HERNIA REPAIR     ICD IMPLANT N/A 04/05/2017   Procedure: ICD Implant;  Surgeon: Evans Lance, MD;  Location: [facility];  Service: Cardiovascular;  Laterality: N/A;   IR FLUORO GUIDE CV LINE RIGHT  05/27/2021   IR FLUORO GUIDE CV LINE RIGHT  06/02/2021   IR US GUIDE VASC ACCESS RIGHT  05/27/2021   LEFT AND RIGHT HEART CATHETERIZATION WITH CORONARY/GRAFT ANGIOGRAM N/A 07/09/2014   Right and left heart cath, bare metal stent to SVG to RCA. Sinclair Grooms, MD;    UMBILICAL HERNIA REPAIR  [phone]    Assessment & Plan Clinical Impression:  [name] [name] is an 80 year old right-handed male with history of CAD/CABG, atrial fibrillation maintained on Coumadin, diastolic congestive heart failure, AICD, COPD with remote  tobacco abuse with supplemental oxygen at night, CKD stage IV.  History taken from chart review and patient.  Patient lives with wife.  1 level home 3 steps to entry.  Reportedly independent prior to admission.  He presented on 05/22/2021 with recent UTI and progressive weakness and fatigue.  Denied any chest pain or shortness of breath.  In the ED blood pressure noted to be 181/59, BUN 91, creatinine 10.49 from a recent baseline 2.52, hemoglobin 7.6.  Renal ultrasound showed some left renal atrophy.  Lower pole right renal  cyst or minimally complex cyst.  No hydronephrosis.  Renal service consulted for severe AKI on CKD with hemodialysis initiated currently on a Tuesday Thursday Saturday schedule and latest creatinine 5.97.  IR consulted for [facility] placement.  His chronic Coumadin has been resumed.  Therapy evaluations completed due to patient decreased functional mobility was admitted for a comprehensive rehab program.  Please see preadmission assessment from earlier today as well. .  Patient transferred to CIR on 06/05/2021 .    Patient currently requires min with basic self-care skills secondary to muscle weakness, decreased cardiorespiratoy endurance and decreased oxygen support, and decreased standing balance and decreased balance strategies.   Prior to hospitalization, patient was fully independent.  Patient will benefit from skilled intervention to increase independence with basic self-care skills prior to discharge home with care partner.  Anticipate patient will require 24 hour supervision and follow up home health.  OT - End of Session Activity Tolerance: Tolerates < 10 min activity, no significant change in vital signs Endurance Deficit: Yes Endurance Deficit Description: SOB throuhgout session OT Assessment Rehab Potential (ACUTE ONLY): Fair OT Patient demonstrates impairments in the following area(s): Balance;Endurance;Motor OT Basic ADL's Functional Problem(s): Grooming;Bathing;Dressing;Toileting OT Transfers Functional Problem(s): Toilet;Tub/Shower OT Additional Impairment(s): None OT Plan OT Intensity: Minimum of 1-2 x/day, 45 to 90 minutes OT Frequency: 5 out of 7 days OT Duration/Estimated Length of Stay: 7 days OT Treatment/Interventions: Balance/vestibular training;Discharge planning;Functional mobility training;DME/adaptive equipment instruction;Pain management;Patient/family education;Psychosocial support;Therapeutic Activities;Self Care/advanced ADL retraining;Therapeutic Exercise;UE/LE Strength  taining/ROM;UE/LE Coordination activities OT Self Feeding Anticipated Outcome(s): independent OT Basic Self-Care Anticipated Outcome(s): supervision OT Toileting Anticipated Outcome(s): supervision OT Bathroom Transfers Anticipated Outcome(s): supervision OT Recommendation Patient destination: Home Follow Up Recommendations: Home health OT Equipment Recommended: To be determined Equipment Details: need to discuss further with pt and his wife   OT Evaluation Precautions/Restrictions  Precautions Precautions: Fall General OT Amount of Missed Time: 41 Minutes Vital Signs Therapy Vitals Temp: 97.7 F (36.5 C) Temp Source: Oral Pulse Rate: (!) 59 Resp: 17 BP: (!) 160/53 Patient Position (if appropriate): Lying Oxygen Therapy SpO2: 100 % O2 Device: Room Air Pain Pain Assessment Pain Scale: 0-10 Pain Score: 0-No pain Home Living/Prior Functioning Home Living Family/patient expects to be discharged to:: Private residence Living Arrangements: Spouse/significant other Available Help at Discharge: Family, Available 24 hours/day Type of Home: House Home Access: Stairs to enter Technical brewer of Steps: 3 Entrance Stairs-Rails: Left Home Layout: One level Bathroom Shower/Tub:  (pt usually uses the walk in) [employer]: Standard Bathroom Accessibility: Yes Additional Comments: Supplemental O2 mostly at night  Lives With: Spouse Prior Function Level of Independence: Independent with basic ADLs, Independent with homemaking with ambulation, Independent with gait  Able to Take Stairs?: Yes Driving: Yes Vocation: Retired Comments: Enjoys Office manager, visiting grandkids, drives Vision Baseline Vision/History: 1 Wears glasses Ability to See in Adequate Light: 0 Adequate Patient Visual Report: No change from baseline Vision Assessment?: No apparent visual deficits Perception  Perception: Within Functional Limits Praxis Praxis: Intact Cognition Overall Cognitive Status:  Within Functional Limits for tasks assessed Arousal/Alertness: Awake/alert Orientation Level: Person;Place;Situation Year: 2022 Month: September Day of Week: Correct Memory: Appears intact Immediate Memory Recall: Sock;Blue;Bed Memory Recall Sock: Without Cue Memory Recall Blue: Without Cue Memory Recall Bed: Without Cue Awareness: Appears intact Sensation Sensation Hot/Cold: Appears Intact Proprioception: Appears Intact Stereognosis: Appears Intact Coordination Gross Motor Movements are Fluid and Coordinated: Yes Fine Motor Movements are Fluid and Coordinated: Yes Heel Shin Test: decreased speed bil Motor  Motor Motor: Within Functional Limits Motor - Skilled Clinical Observations: generalized weakness  Trunk/Postural Assessment  Cervical Assessment Cervical Assessment: Exceptions to [facility] (forward head) Thoracic Assessment Thoracic Assessment: Exceptions to [facility] (excessive kyphsosis) Lumbar Assessment Lumbar Assessment: Exceptions to [facility] (decreased lordosis) Postural Control Postural Control: Within Functional Limits  Balance Balance Balance Assessed: Yes Static Sitting Balance Static Sitting - Balance Support: No upper extremity supported Static Sitting - Level of Assistance: 6: Modified independent (Device/Increase time) Dynamic Sitting Balance Dynamic Sitting - Balance Support: No upper extremity supported Dynamic Sitting - Level of Assistance: 5: Stand by assistance Static Standing Balance Static Standing - Balance Support: Bilateral upper extremity supported Static Standing - Level of Assistance: 5: Stand by assistance Dynamic Standing Balance Dynamic Standing - Balance Support: Bilateral upper extremity supported Dynamic Standing - Level of Assistance: 4: Min assist Extremity/Trunk Assessment RUE Assessment RUE Assessment: Within Functional Limits LUE Assessment LUE Assessment: Within Functional Limits  Care Tool Care Tool Self Care Eating   Eating  Assist Level: Set up assist    Oral Care    Oral Care Assist Level: Set up assist    Bathing Bathing activity did not occur: Refused            Upper Body Dressing(including orthotics)   What is the patient wearing?: Hospital gown only   Assist Level: Minimal Assistance - Patient > 75%    Lower Body Dressing (excluding footwear)   What is the patient wearing?: Incontinence brief;Pants Assist for lower body dressing: Moderate Assistance - Patient 50 - 74%    Putting on/Taking off footwear   What is the patient wearing?: Non-skid slipper socks Assist for footwear: Moderate Assistance - Patient 50 - 74%       Care Tool Toileting Toileting activity Toileting Activity did not occur (Clothing management and hygiene only): Refused       Care Tool Bed Mobility Roll left and right activity   Roll left and right assist level: Supervision/Verbal cueing    Sit to lying activity   Sit to lying assist level: Supervision/Verbal cueing    Lying to sitting on side of bed activity   Lying to sitting on side of bed assist level: the ability to move from lying on the back to sitting on the side of the bed with no back support.: Supervision/Verbal cueing     Care Tool Transfers Sit to stand transfer   Sit to stand assist level: Minimal Assistance - Patient > 75%    Chair/bed transfer   Chair/bed transfer assist level: Minimal Assistance - Patient > 75%     Toilet transfer Toilet transfer activity did not occur: Refused       Care Tool Cognition  Expression of Ideas and Wants Expression of Ideas and Wants: 3. Some difficulty - exhibits some difficulty with expressing needs and ideas (e.g, some words or finishing thoughts) or speech is not clear  Understanding Verbal and Non-Verbal Content Understanding Verbal and Non-Verbal  Content: 3. Usually understands - understands most conversations, but misses some part/intent of message. Requires cues at times to understand   Memory/Recall  Ability Memory/Recall Ability : Current season;Location of own room;Staff names and faces;That he or she is in a hospital/hospital unit   Refer to Care Plan for [place] 1 OT Short Term Goal 1 (Week 1): STGs = LTGs  Recommendations for other services: None    Skilled Therapeutic Intervention ADL ADL ADL Comments: on eval, pt donned hospital gown, pants and socks with PT with min A but refused to work on toileting, bathing for OT as he stated he was too fatigued and felt he was not breathing well enough (RN aware, pt's vitals stable) Mobility  Bed Mobility Bed Mobility: Rolling Left;Rolling Right;Sit to Supine;Supine to Sit Rolling Right: Supervision/verbal cueing Rolling Left: Supervision/Verbal cueing Supine to Sit: Supervision/Verbal cueing Sit to Supine: Supervision/Verbal cueing Transfers Sit to Stand: Minimal Assistance - Patient > 75%   Pt seen for initial evaluation, but pt repeatedly refused to get up again due to feeling extremely fatigued. Tried for over 20 minutes to get pt to engage but he stated he just could not participate.  Discussed his PLOF, home set up, goals for being more independent.  Will need to meet with wife to learn more about DME needs. Pt continuing to rest in recliner with belt alarm on and all needs met.   Discharge Criteria: Patient will be discharged from OT if patient refuses treatment 3 consecutive times without medical reason, if treatment goals not met, if there is a change in medical status, if patient makes no progress towards goals or if patient is discharged from hospital.  The above assessment, treatment plan, treatment alternatives and goals were discussed and mutually agreed upon: by patient  [facility] 06/06/2021, 12:57 PM

## 2021-06-06 NOTE — Progress Notes (Signed)
Nurse called report from dialysis. 2 liters removed. Final bp 156/63. Pt arrived thereafter

## 2021-06-06 NOTE — Plan of Care (Signed)
  Problem: RH Balance Goal: LTG Patient will maintain dynamic sitting balance (PT) Description: LTG:  Patient will maintain dynamic sitting balance with assistance during mobility activities (PT) Flowsheets (Taken 06/06/2021 1825) LTG: Pt will maintain dynamic sitting balance during mobility activities with:: Independent with assistive device  Goal: LTG Patient will maintain dynamic standing balance (PT) Description: LTG:  Patient will maintain dynamic standing balance with assistance during mobility activities (PT) Flowsheets (Taken 06/06/2021 1825) LTG: Pt will maintain dynamic standing balance during mobility activities with:: Supervision/Verbal cueing   Problem: RH Bed Mobility Goal: LTG Patient will perform bed mobility with assist (PT) Description: LTG: Patient will perform bed mobility with assistance, with/without cues (PT). Flowsheets (Taken 06/06/2021 1825) LTG: Pt will perform bed mobility with assistance level of: Independent with assistive device    Problem: RH Bed to Chair Transfers Goal: LTG Patient will perform bed/chair transfers w/assist (PT) Description: LTG: Patient will perform bed to chair transfers with assistance (PT). Flowsheets (Taken 06/06/2021 1825) LTG: Pt will perform Bed to Chair Transfers with assistance level: Supervision/Verbal cueing   Problem: RH Car Transfers Goal: LTG Patient will perform car transfers with assist (PT) Description: LTG: Patient will perform car transfers with assistance (PT). Flowsheets (Taken 06/06/2021 1825) LTG: Pt will perform car transfers with assist:: Supervision/Verbal cueing   Problem: RH Furniture Transfers Goal: LTG Patient will perform furniture transfers w/assist (OT/PT) Description: LTG: Patient will perform furniture transfers  with assistance (OT/PT). Flowsheets (Taken 06/06/2021 1825) LTG: Pt will perform furniture transfers with assist:: Supervision/Verbal cueing   Problem: RH Ambulation Goal: LTG Patient will ambulate in  controlled environment (PT) Description: LTG: Patient will ambulate in a controlled environment, # of feet with assistance (PT). Flowsheets (Taken 06/06/2021 1825) LTG: Pt will ambulate in controlled environ  assist needed:: Supervision/Verbal cueing LTG: Ambulation distance in controlled environment: 149ft with LRAD Goal: LTG Patient will ambulate in home environment (PT) Description: LTG: Patient will ambulate in home environment, # of feet with assistance (PT). Flowsheets (Taken 06/06/2021 1825) LTG: Pt will ambulate in home environ  assist needed:: Supervision/Verbal cueing LTG: Ambulation distance in home environment: 12ft with LRAD   Problem: RH Wheelchair Mobility Goal: LTG Patient will propel w/c in controlled environment (PT) Description: LTG: Patient will propel wheelchair in controlled environment, # of feet with assist (PT) Flowsheets (Taken 06/06/2021 1825) LTG: Pt will propel w/c in controlled environ  assist needed:: Supervision/Verbal cueing LTG: Propel w/c distance in controlled environment: 130ft   Problem: RH Stairs Goal: LTG Patient will ambulate up and down stairs w/assist (PT) Description: LTG: Patient will ambulate up and down # of stairs with assistance (PT) Flowsheets (Taken 06/06/2021 1825) LTG: Pt will ambulate up/down stairs assist needed:: Supervision/Verbal cueing LTG: Pt will  ambulate up and down number of stairs: 3 steps with 1 rail L

## 2021-06-06 NOTE — Progress Notes (Signed)
ANTICOAGULATION CONSULT NOTE - Follow Up Consult  Pharmacy Consult for Warfarin Indication: atrial fibrillation  No Known Allergies  Patient Measurements: Height: 5\' 7"  (170.2 cm) Weight: 81 kg (178 lb 9.2 oz) IBW/kg (Calculated) : 66.1  Vital Signs: Temp: 97.7 F (36.5 C) (09/03 1254) Temp Source: Oral (09/03 1254) BP: 167/64 (09/03 1430) Pulse Rate: 60 (09/03 1430)  Labs: Recent Labs    06/04/21 0353 06/05/21 0441 06/05/21 1608 06/06/21 0516 06/06/21 1327  HGB 8.1*  --  8.7*  --  8.7*  HCT 25.2*  --  26.8*  --  27.5*  PLT 241  --  237  --  249  LABPROT 30.7* 27.6*  --  22.4*  --   INR 2.9* 2.6*  --  2.0*  --   CREATININE 5.97*  --  4.41*  --  4.80*     Estimated Creatinine Clearance: 12.3 mL/min (A) (by C-G formula based on SCr of 4.8 mg/dL (H)).   Assessment: 80yo male on warfarin PTA for Afib. Admit INR 2.7 was therapeutic on PTA regimen of warfarin 5mg  Tue and Fri, 2.5 mg all other days (recently changed 8/18).  INR 9/3 trending down from 2.6>2.0, last 3 doses have been held, no reports of bleeding today and will begin adding back warfarin at reduced dose from usual PTA 5mg  Tue and Fri. H/H stable, PLT WNL, no signs of bleeding noted per RN.   Goal of Therapy:  INR 2-3 Monitor platelets by anticoagulation protocol: Yes   Plan:  Warfarin reduced dose 2.5 mg PO x 1 Daily INR, s/s bleeding  Adria Dill, PharmD PGY-1 Acute Care Resident  06/06/2021 2:40 PM

## 2021-06-06 NOTE — Progress Notes (Signed)
Physical Therapy Session Note  Patient Details  Name: [name] [name] MRN: [id number] Date of Birth: 1941-05-29  Today's Date: 06/06/2021      Short Term Goals: Week 1:  PT Short Term Goal 1 (Week 1): STG=LTG due to ELOS  Skilled Therapeutic Interventions/Progress Updates:   Pt off unit for dialysis at time for PT treatment session. Pt will re-attempt at later time/date as pt is medically appropriate.      Therapy Documentation Precautions:  Precautions Precautions: Fall Restrictions Weight Bearing Restrictions: No General: PT Amount of Missed Time (min): 60 Minutes PT Missed Treatment Reason: Other (Comment) (dialysis)     Therapy/Group: Individual Therapy  Lorie Phenix 06/06/2021, 6:24 PM

## 2021-06-06 NOTE — Progress Notes (Signed)
Pt transported to dialysis

## 2021-06-06 NOTE — Progress Notes (Signed)
PROGRESS NOTE   Subjective/Complaints: Feeling "a little bit better" C/o SOB- made it difficult for him to tolerate therapy but states his therapist was accommodating Denies pain  ROS: + SOB   Objective:   No results found. Recent Labs    06/04/21 0353 06/05/21 1608  WBC 8.8 9.0  HGB 8.1* 8.7*  HCT 25.2* 26.8*  PLT 241 237   Recent Labs    06/04/21 0353 06/05/21 1608  NA 134* 133*  K 3.8 3.8  CL 97* 98  CO2 24 27  GLUCOSE 65* 98  BUN 47* 27*  CREATININE 5.97* 4.41*  CALCIUM 7.9* 8.0*    Intake/Output Summary (Last 24 hours) at 06/06/2021 1253 Last data filed at 06/06/2021 0742 Gross per 24 hour  Intake 297 ml  Output 350 ml  Net -53 ml        Physical Exam: Vital Signs Blood pressure (!) 166/59, pulse 60, temperature 98.4 F (36.9 C), temperature source Oral, resp. rate 17, height 5\' 7"  (1.702 m), weight 81 kg, SpO2 94 %. Gen: no distress, normal appearing HEENT: oral mucosa pink and moist, NCAT Cardio: Reg rate Pulmonary:     Effort: Pulmonary effort is normal.     Breath sounds: Normal breath sounds.     Comments: + [name] [name]:     General: Abdomen is flat. There is no distension.     Comments: Bowel sounds hypoactive  Musculoskeletal:     Cervical back: Normal range of motion and neck supple.     Comments: No edema or tenderness in extremities  Skin:    General: Skin is warm and dry.  Neurological:     Mental Status: He is alert.     Comments: Alert and oriented Makes eye contact with examiner. Motor: Bilateral upper extremities: 4/5 proximal distal Right lower extremity: 4/5 proximal distal Left lower extremity: 4-/5 proximal distal  Psychiatric:        Mood and Affect: Mood normal.        Behavior: Behavior normal.    Assessment/Plan: 1. Functional deficits which require 3+ hours per day of interdisciplinary therapy in a comprehensive inpatient rehab setting. Physiatrist is  providing close team supervision and 24 hour management of active medical problems listed below. Physiatrist and rehab team continue to assess barriers to discharge/monitor patient progress toward functional and medical goals  Care Tool:  Bathing  Bathing activity did not occur: Refused           Bathing assist       Upper Body Dressing/Undressing Upper body dressing   What is the patient wearing?: Hospital gown only    Upper body assist Assist Level: Minimal Assistance - Patient > 75%    Lower Body Dressing/Undressing Lower body dressing      What is the patient wearing?: Incontinence brief, Pants     Lower body assist Assist for lower body dressing: Moderate Assistance - Patient 50 - 74%     Toileting Toileting Toileting Activity did not occur Landscape architect and hygiene only): Refused  Toileting assist Assist for toileting: Minimal Assistance - Patient > 75%     Transfers Chair/bed transfer  Transfers assist  Chair/bed transfer assist level: Minimal Assistance - Patient > 75%     Locomotion Ambulation   Ambulation assist      Assist level: Minimal Assistance - Patient > 75% Assistive device: Walker-rolling Max distance: 29   Walk 10 feet activity   Assist     Assist level: Minimal Assistance - Patient > 75% Assistive device: Walker-rolling   Walk 50 feet activity   Assist Walk 50 feet with 2 turns activity did not occur: Safety/medical concerns         Walk 150 feet activity   Assist Walk 150 feet activity did not occur: Safety/medical concerns         Walk 10 feet on uneven surface  activity   Assist Walk 10 feet on uneven surfaces activity did not occur: Safety/medical concerns         Wheelchair     Assist Is the patient using a wheelchair?: Yes      Wheelchair assist level: Supervision/Verbal cueing Max wheelchair distance: 50    Wheelchair 50 feet with 2 turns activity    Assist         Assist Level: Supervision/Verbal cueing   Wheelchair 150 feet activity     Assist      Assist Level: Moderate Assistance - Patient 50 - 74%   Blood pressure (!) 166/59, pulse 60, temperature 98.4 F (36.9 C), temperature source Oral, resp. rate 17, height 5\' 7"  (1.702 m), weight 81 kg, SpO2 94 %.    Medical Problem List and Plan: 1.   Debility secondary to AKI on CKD/multi medical             -patient may not shower             -ELOS/Goals: 4-6 days/supervision/mod I             Initial CIR evaluations today 2.  Antithrombotics: -DVT/anticoagulation:  Pharmaceutical: Coumadin             -antiplatelet therapy: N/A 3. Pain Management: Tylenol as needed 4. Mood: Provide emotional support             -antipsychotic agents: N/A 5. Neuropsych: This patient is capable of making decisions on his own behalf. 6. Skin/Wound Care: Routine skin checks 7. Fluids/Electrolytes/Nutrition: Routine in and outs 8.  AKI on CKD.  Hemodialysis initiated.  Follow-up nephrology services             Labs with HD 9.  Acute on chronic anemia.  Patient did receive a total of 3 units packed red blood cells.  Continue iron supplement             CBC ordered 10.  History of COPD/asthma.  Chronically on 2.5-3 L oxygen nightly. Ordered incentive spirometer to improve breathing strength and ability over time. 11.  Diastolic congestive heart failure.  Monitor for any signs of fluid overload.  Currently on Coreg 3.125 mg twice daily and amlodipine.  Hydralazine Entresto and Aldactone currently on hold 12.  Atrial fibrillation.  Continue Coumadin.  Cardiac rate controlled. INR reviewed: 2.0 on 9/3 13.  Hyperlipidemia.  Pravachol 14.  GERD.  Protonix 15.  Hypothyroidism: Continue Synthroid 16. Overweight BMI 27.97: provide dietary education.   LOS: 1 days A FACE TO FACE EVALUATION WAS PERFORMED  [name] [name] 06/06/2021, 12:53 PM

## 2021-06-06 NOTE — Evaluation (Signed)
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    COPD (chronic obstructive pulmonary disease) ([facility])    on home oxygen, 2 liters at night10/2015   Esophageal reflux    Gallstones    Gastric antral vascular ectasia 2013   Gout    Heme positive stool    Hepatomegaly    Hiatal hernia    History of blood transfusion ~ 2012   "blood count dropped; had to get 3 units"   Hyperlipidemia    Hypothyroidism    Other and unspecified coagulation defects    Personal history of colonic polyps 05/29/2010   TUBULAR ADENOMA   Unspecified essential hypertension    Past Surgical History:  Past Surgical History:  Procedure Laterality Date   CARDIAC CATHETERIZATION N/A 07/15/2016   Procedure: Right/Left Heart Cath and Coronary/Graft Angiography;  Surgeon: Belva Crome, MD;  Location: [facility];  Service: Cardiovascular;  Laterality: N/A;   cholecystomy tube  12/28/2014 - 01/06/2015   tube clogged with debris, removed and IR unable to place new tube.    COLONOSCOPY  2013   Dr. Sharlett Iles: no polyps or evidence of active bleeding   CORONARY ANGIOPLASTY WITH STENT PLACEMENT  ~ 2008; 07/08/2014   "1 + 3"   CORONARY ARTERY BYPASS GRAFT  1998   CABG X4   ESOPHAGOGASTRODUODENOSCOPY  2013   Dr. 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 Sharlett Iles: AVM at 47 and blood at 30 minutes beyond first duodenal image but not actual lesion seen. If persistent IDA, bleeding, recommend enteroscopy with ablation    HERNIA REPAIR     ICD IMPLANT N/A 04/05/2017   Procedure: ICD Implant;  Surgeon: Evans Lance, MD;  Location: [facility];  Service: Cardiovascular;  Laterality: N/A;   IR FLUORO GUIDE CV LINE RIGHT  05/27/2021   IR FLUORO GUIDE CV LINE RIGHT  06/02/2021   IR US GUIDE VASC ACCESS RIGHT  05/27/2021   LEFT AND RIGHT HEART CATHETERIZATION WITH CORONARY/GRAFT ANGIOGRAM N/A 07/09/2014   Right and left heart cath, bare metal stent to SVG to RCA. Sinclair Grooms, MD;    UMBILICAL HERNIA REPAIR  [phone]    Assessment & Plan Clinical Impression: Patient is a 80 year old right-handed male with history of CAD/CABG, atrial fibrillation maintained on Coumadin, diastolic congestive heart failure, AICD, COPD with remote tobacco abuse with supplemental oxygen at night, CKD stage IV.  History taken from chart review and patient.  Patient lives with wife.  1 level home 3 steps to entry.  Reportedly independent prior to admission.  He presented on 05/22/2021 with recent UTI and progressive weakness and fatigue.  Denied any chest pain or shortness of breath.  In the ED blood pressure noted to be 181/59, BUN 91, creatinine 10.49 from a recent baseline 2.52, hemoglobin 7.6.  Renal ultrasound showed some left renal atrophy.  Lower pole right renal cyst or minimally complex cyst.  No hydronephrosis.  Renal service consulted for severe AKI on CKD with hemodialysis initiated currently on a Tuesday Thursday Saturday schedule and latest creatinine 5.97.  IR consulted for [facility]  placement.  His chronic Coumadin has been resumed.  Therapy evaluations completed due to patient decreased functional mobility was admitted for a comprehensive rehab program.   Patient transferred to CIR on 06/05/2021 .   Patient currently requires min with mobility secondary to muscle weakness and muscle joint tightness, decreased cardiorespiratoy endurance and decreased oxygen support, and decreased standing balance and decreased balance strategies.  Prior to hospitalization, patient was independent  with mobility and lived with Spouse in a House home.  Home access is 3Stairs to enter.  Patient will benefit from skilled PT intervention to maximize safe functional mobility, minimize fall risk, and decrease caregiver burden for planned discharge home with intermittent assist.  Anticipate patient will benefit from follow up [facility] at discharge.  PT - End of Session Activity Tolerance: Tolerates < 10 min activity, no significant change in vital signs Endurance Deficit: Yes Endurance Deficit Description: SOB throuhgout session PT Assessment Rehab Potential (ACUTE/IP ONLY): Good PT Barriers to Discharge: Inaccessible home environment;Home environment access/layout;Other (comments);New oxygen PT Patient demonstrates impairments in the following area(s): Balance;Edema;Endurance;Safety;Skin Integrity PT Transfers Functional Problem(s): Bed to Chair;Car;Bed Mobility;Furniture;Floor PT Locomotion Functional Problem(s): Ambulation;Wheelchair Mobility;Stairs PT Plan PT Intensity: Minimum of 1-2 x/day ,45 to 90 minutes PT Frequency: 5 out of 7 days PT Duration Estimated Length of Stay: 6-9 days PT Treatment/Interventions: Ambulation/gait training;Balance/vestibular training;Cognitive remediation/compensation;Disease management/prevention;Discharge planning;Community reintegration;DME/adaptive equipment instruction;Functional electrical stimulation;Functional mobility training;Patient/family education;Pain  management;Neuromuscular re-education;Psychosocial support;Skin care/wound management;Splinting/orthotics;Therapeutic Exercise;Therapeutic Activities;Stair training;UE/LE Strength taining/ROM;UE/LE Coordination activities;Visual/perceptual remediation/compensation;Wheelchair propulsion/positioning PT Transfers Anticipated Outcome(s): Supervision assist with LRAD PT Locomotion Anticipated Outcome(s): Ambulatory with Supervision assist and LRAD PT Recommendation Recommendations for Other Services: Therapeutic Recreation consult Therapeutic Recreation Interventions: Stress management;Outing/community reintergration Follow Up Recommendations: Home health PT Patient destination: Home Equipment Recommended: Rolling walker with 5" wheels   PT Evaluation Precautions/Restrictions   Fall. O2.  General   Vital Signs Pain Pain Assessment Pain Scale: 0-10 Pain Score: 0-No pain Pain Interference Pain Interference Pain Effect on Sleep: 0. Does not apply - I have not had any pain or hurting in the past 5 days Pain Interference with Therapy Activities: 1. Rarely or not at all Pain Interference with Day-to-Day Activities: 1. Rarely or not at all Home Living/Prior [place] Available Help at Discharge: Family;Available 24 hours/day Type of Home: House Home Access: Stairs to enter [employer] of Steps: 3 Entrance Stairs-Rails: Left Home Layout: One level Bathroom Shower/Tub: Tub/shower unit;Walk-in shower Bathroom Toilet: Standard Bathroom Accessibility: Yes Additional Comments: Supplemental O2 mostly at night  Lives With: Spouse Prior Function Level of Independence: Independent with basic ADLs;Independent with homemaking with ambulation;Independent with gait  Able to Take Stairs?: Yes Driving: Yes Vocation: Retired Comments: Enjoys Office manager, visiting grandkids, drives Vision/Perception  Geologist, engineering: Within Advertising copywriter Praxis Praxis: Intact   Cognition Overall Cognitive Status: Within Functional Limits for tasks assessed Arousal/Alertness: Awake/alert Sensation Sensation Light Touch: Appears Intact Coordination Gross Motor Movements are Fluid and Coordinated:  Yes Fine Motor Movements are Fluid and Coordinated: Yes Heel Shin Test: decreased speed bil Motor  Motor Motor: Within Functional Limits Motor - Skilled Clinical Observations: generalized weakness   Trunk/Postural Assessment  Cervical Assessment Cervical Assessment: Exceptions to [facility] (forward head) Thoracic Assessment Thoracic Assessment: Exceptions to [facility] (excessive kyphsosis) Lumbar Assessment Lumbar Assessment: Exceptions to [facility] (decreased lordosis) Postural Control Postural Control: Within Functional Limits  Balance Balance Balance Assessed: Yes Static Sitting Balance Static Sitting - Balance Support: No upper extremity supported Static Sitting - Level of Assistance: 6: Modified independent (Device/Increase time) Dynamic Sitting Balance Dynamic Sitting - Balance Support: No upper extremity supported Dynamic Sitting - Level of Assistance: 5: Stand by assistance Static Standing Balance Static Standing - Balance Support: Bilateral upper extremity supported Static Standing - Level of Assistance: 5: Stand by assistance Dynamic Standing Balance Dynamic Standing - Balance Support: Bilateral upper extremity supported Dynamic Standing - Level of Assistance: 4: Min assist Extremity Assessment      RLE Assessment RLE Assessment: Within Functional Limits General Strength Comments: grossly 4+/5 proximal to distal LLE Assessment LLE Assessment: Exceptions to [facility] General Strength Comments: grossly 4/5 proximal to distal  Care Tool Care Tool Bed Mobility Roll left and right activity   Roll left and right assist level: Supervision/Verbal cueing    Sit to lying activity   Sit to lying assist level: Supervision/Verbal cueing    Lying to sitting on side of  bed activity   Lying to sitting on side of bed assist level: the ability to move from lying on the back to sitting on the side of the bed with no back support.: Supervision/Verbal cueing     Care Tool Transfers Sit to stand transfer   Sit to stand assist level: Minimal Assistance - Patient > 75%    Chair/bed transfer   Chair/bed transfer assist level: Minimal Assistance - Patient > 75%     Psychologist, counselling transfer activity did not occur: Refused        Care Tool Locomotion Ambulation   Assist level: Minimal Assistance - Patient > 75% Assistive device: Walker-rolling Max distance: 29  Walk 10 feet activity   Assist level: Minimal Assistance - Patient > 75% Assistive device: Walker-rolling   Walk 50 feet with 2 turns activity Walk 50 feet with 2 turns activity did not occur: Safety/medical concerns      Walk 150 feet activity Walk 150 feet activity did not occur: Safety/medical concerns      Walk 10 feet on uneven surfaces activity Walk 10 feet on uneven surfaces activity did not occur: Safety/medical concerns      Stairs Stair activity did not occur: Refused        Walk up/down 1 step activity Walk up/down 1 step or curb (drop down) activity did not occur: Refused     Walk up/down 4 steps activity did not occuR: Refused  Walk up/down 4 steps activity      Walk up/down 12 steps activity Walk up/down 12 steps activity did not occur: Refused      Pick up small objects from floor Pick up small object from the floor (from standing position) activity did not occur: Refused      Wheelchair Is the patient using a wheelchair?: Yes     Wheelchair assist level: Supervision/Verbal cueing Max wheelchair distance: 50  Wheel 50 feet with 2 turns activity   Assist Level: Supervision/Verbal cueing  Wheel 150 feet activity  Assist Level: Moderate Assistance - Patient 50 - 74%    Refer to Care Plan for Long Term Goals  SHORT TERM GOAL WEEK 1 PT  Short Term Goal 1 (Week 1): STG=LTG due to ELOS  Recommendations for other services: Therapeutic Recreation  Stress management and Outing/community reintegration  Skilled Therapeutic Intervention  Pt received supine in bed and agreeable to PT. Supine>sit transfer with supervision assist cues for safety and decreased use of rails. PT instructed patient in PT Evaluation and initiated treatment intervention; see above for results. PT educated patient in [place], rehab potential, rehab goals, and discharge recommendations along with recommendation for follow-up rehabilitation services.  Pt performed transfers, gait and WC mobility as listed. Pt continually reporting SOB, but SpO2 96% on 2 L/min and RR:20 breaths per min. Patient returned to room and performed stand pivot to recliner with min assist and no AD. Pt left sitting in recliner with call bell in reach and all needs met.      Mobility Bed Mobility Bed Mobility: Rolling Left;Rolling Right;Sit to Supine;Supine to Sit Rolling Right: Supervision/verbal cueing Rolling Left: Supervision/Verbal cueing Supine to Sit: Supervision/Verbal cueing Sit to Supine: Supervision/Verbal cueing Transfers Transfers: Sit to Stand;Stand Pivot Transfers Sit to Stand: Minimal Assistance - Patient > 75% Stand Pivot Transfers: Minimal Assistance - Patient > 75% Stand Pivot Transfer Details (indicate cue type and reason): and no device Transfer (Assistive device): Rolling walker Locomotion  Gait Ambulation: Yes Gait Assistance: Minimal Assistance - Patient > 75% Gait Distance (Feet): 20 Feet Assistive device: Rolling walker Gait Gait: Yes Gait Pattern: Impaired Gait Pattern: Step-through pattern;Decreased stride length Stairs / Additional Locomotion Stairs: No Wheelchair Mobility Wheelchair Mobility: Yes Wheelchair Assistance: Chartered loss adjuster: Both upper extremities Wheelchair Parts Management: Needs assistance Distance:  50   Discharge Criteria: Patient will be discharged from PT if patient refuses treatment 3 consecutive times without medical reason, if treatment goals not met, if there is a change in medical status, if patient makes no progress towards goals or if patient is discharged from hospital.  The above assessment, treatment plan, treatment alternatives and goals were discussed and mutually agreed upon: by patient  Lorie Phenix 06/06/2021, 10:07 AM

## 2021-06-07 DIAGNOSIS — R5381 Other malaise: Secondary | ICD-10-CM | POA: Diagnosis not present

## 2021-06-07 LAB — PROTIME-INR
INR: 1.8 — ABNORMAL HIGH (ref 0.8–1.2)
Prothrombin Time: 21.1 seconds — ABNORMAL HIGH (ref 11.4–15.2)

## 2021-06-07 MED ORDER — CARVEDILOL 3.125 MG PO TABS
3.1250 mg | ORAL_TABLET | Freq: Every day | ORAL | Status: DC
  Administered 2021-06-09: 3.125 mg via ORAL
  Filled 2021-06-07 (×2): qty 1

## 2021-06-07 NOTE — Progress Notes (Addendum)
ANTICOAGULATION CONSULT NOTE - Follow Up Consult  Pharmacy Consult for Warfarin Indication: atrial fibrillation  No Known Allergies  Patient Measurements: Height: 5\' 7"  (170.2 cm) Weight: 81 kg (178 lb 9.2 oz) IBW/kg (Calculated) : 66.1  Vital Signs: Temp: 99.4 F (37.4 C) (09/04 0540) Temp Source: Oral (09/04 0540) BP: 157/53 (09/04 0540) Pulse Rate: 55 (09/04 0540)  Labs: Recent Labs    06/05/21 0441 06/05/21 1608 06/06/21 0516 06/06/21 1327 06/07/21 0500  HGB  --  8.7*  --  8.7*  --   HCT  --  26.8*  --  27.5*  --   PLT  --  237  --  249  --   LABPROT 27.6*  --  22.4*  --  21.1*  INR 2.6*  --  2.0*  --  1.8*  CREATININE  --  4.41*  --  4.80*  --      Estimated Creatinine Clearance: 12.3 mL/min (A) (by C-G formula based on SCr of 4.8 mg/dL (H)).   Assessment: 80yo male on warfarin PTA for Afib.  Admit INR 2.7 was therapeutic on PTA regimen of warfarin 5mg  Tue and Fri, 2.5 mg all other days (recently changed 8/18).  INR subtherapeutic at 1.8 this morning.  Given hematuria present, will hold warfarin dose today and plan to resume tomorrow if resolves per MD.   Goal of Therapy:  INR 2-3 Monitor platelets by anticoagulation protocol: Yes   Plan:  Hold warfarin dose today.  Daily PT/INR. Monitor CBC and for signs/symptoms of bleeding.   Vance Peper, PharmD PGY1 Pharmacy Resident Phone [phone] 06/07/2021 7:31 AM   Please check AMION for all [facility] Pharm[facility] phone numbers After 10:00 PM, call [place] [phone]

## 2021-06-07 NOTE — Progress Notes (Signed)
Pt voided 300 ml of dark red urine, clots observed. No complaints of pain/burning. Pt encourage to intake water and notify nurse with next void for further assessment.

## 2021-06-07 NOTE — Progress Notes (Signed)
PROGRESS NOTE   Subjective/Complaints: Sleepy Appreciate nephrology follow-up- continue current care  ROS: + SOB, wheezing   Objective:   No results found. Recent Labs    06/05/21 1608 06/06/21 1327  WBC 9.0 10.4  HGB 8.7* 8.7*  HCT 26.8* 27.5*  PLT 237 249   Recent Labs    06/05/21 1608 06/06/21 1327  NA 133* 132*  K 3.8 3.9  CL 98 97*  CO2 27 27  GLUCOSE 98 104*  BUN 27* 33*  CREATININE 4.41* 4.80*  CALCIUM 8.0* 8.2*    Intake/Output Summary (Last 24 hours) at 06/07/2021 1114 Last data filed at 06/07/2021 0540 Gross per 24 hour  Intake 0 ml  Output 2100 ml  Net -2100 ml        Physical Exam: Vital Signs Blood pressure (!) 157/53, pulse (!) 55, temperature 99.4 F (37.4 C), temperature source Oral, resp. rate 17, height 5\' 7"  (1.702 m), weight 81 kg, SpO2 100 %. Gen: no distress, normal appearing HEENT: oral mucosa pink and moist, NCAT Cardio: Reg rate Pulmonary:     Effort: Pulmonary effort is normal.     Breath sounds: Normal breath sounds.     Comments: + [place], +wheezing Abdominal:     General: Abdomen is flat. There is no distension.     Comments: Bowel sounds hypoactive  Musculoskeletal:     Cervical back: Normal range of motion and neck supple.     Comments: No edema or tenderness in extremities  Skin:    General: Skin is warm and dry.  Neurological:     Mental Status: He is alert.     Comments: Alert and oriented Makes eye contact with examiner. Motor: Bilateral upper extremities: 4/5 proximal distal Right lower extremity: 4/5 proximal distal Left lower extremity: 4-/5 proximal distal  Psychiatric:        Mood and Affect: Mood normal.        Behavior: Behavior normal.    Assessment/Plan: 1. Functional deficits which require 3+ hours per day of interdisciplinary therapy in a comprehensive inpatient rehab setting. Physiatrist is providing close team supervision and 24 hour  management of active medical problems listed below. Physiatrist and rehab team continue to assess barriers to discharge/monitor patient progress toward functional and medical goals  Care Tool:  Bathing  Bathing activity did not occur: Refused           Bathing assist       Upper Body Dressing/Undressing Upper body dressing   What is the patient wearing?: Hospital gown only    Upper body assist Assist Level: Minimal Assistance - Patient > 75%    Lower Body Dressing/Undressing Lower body dressing      What is the patient wearing?: Incontinence brief, Pants     Lower body assist Assist for lower body dressing: Moderate Assistance - Patient 50 - 74%     Toileting Toileting Toileting Activity did not occur Landscape architect and hygiene only): Refused  Toileting assist Assist for toileting: Independent with assistive device Assistive Device Comment: urinal   Transfers Chair/bed transfer  Transfers assist     Chair/bed transfer assist level: Minimal Assistance - Patient > 75%  Locomotion Ambulation   Ambulation assist      Assist level: Minimal Assistance - Patient > 75% Assistive device: Walker-rolling Max distance: 29   Walk 10 feet activity   Assist     Assist level: Minimal Assistance - Patient > 75% Assistive device: Walker-rolling   Walk 50 feet activity   Assist Walk 50 feet with 2 turns activity did not occur: Safety/medical concerns         Walk 150 feet activity   Assist Walk 150 feet activity did not occur: Safety/medical concerns         Walk 10 feet on uneven surface  activity   Assist Walk 10 feet on uneven surfaces activity did not occur: Safety/medical concerns         Wheelchair     Assist Is the patient using a wheelchair?: Yes      Wheelchair assist level: Supervision/Verbal cueing Max wheelchair distance: 50    Wheelchair 50 feet with 2 turns activity    Assist        Assist Level:  Supervision/Verbal cueing   Wheelchair 150 feet activity     Assist      Assist Level: Moderate Assistance - Patient 50 - 74%   Blood pressure (!) 157/53, pulse (!) 55, temperature 99.4 F (37.4 C), temperature source Oral, resp. rate 17, height 5\' 7"  (1.702 m), weight 81 kg, SpO2 100 %.    Medical Problem List and Plan: 1.   Debility secondary to AKI on CKD/multi medical             -patient may not shower             -ELOS/Goals: 4-6 days/supervision/mod I            Continue CIR 2.  Antithrombotics: -DVT/anticoagulation:  Pharmaceutical: Coumadin             -antiplatelet therapy: N/A 3. Pain Management: Tylenol as needed 4. Mood: Provide emotional support             -antipsychotic agents: N/A 5. Neuropsych: This patient is capable of making decisions on his own behalf. 6. Skin/Wound Care: Routine skin checks 7. Fluids/Electrolytes/Nutrition: Routine in and outs 8.  AKI on CKD.  Hemodialysis initiated.  Follow-up nephrology services             Labs with HD 9.  Acute on chronic anemia.  Patient did receive a total of 3 units packed red blood cells.  Continue iron supplement             CBC ordered 10.  History of COPD/asthma.  Chronically on 2.5-3 L oxygen nightly. Ordered incentive spirometer to improve breathing strength and ability over time. 11.  Diastolic congestive heart failure.  Monitor for any signs of fluid overload.  Currently on Coreg 3.125 mg twice daily and amlodipine.  Hydralazine Entresto and Aldactone currently on hold 12.  Atrial fibrillation.  Continue Coumadin.  Cardiac rate controlled. INR reviewed: 1.8 on 9/4 13.  Hyperlipidemia.  Pravachol 14.  GERD.  Continue Protonix 15.  Hypothyroidism: Continue Synthroid 16. Overweight BMI 27.97: provide dietary education.  17. Bradycardia: decrease Coreg to daily. LOS: 2 days A FACE TO FACE EVALUATION WAS PERFORMED  Veta Dambrosia P Neriyah Cercone 06/07/2021, 11:14 AM

## 2021-06-07 NOTE — Progress Notes (Signed)
Patient ID: Dylin ELJAY LAVE, male   DOB: 16-Mar-1941, 80 y.o.   MRN: [id number] S: no acute events.  Tolerated HD yesterday, net UF 2 L. O:BP (!) 157/53 (BP Location: Left Arm)   Pulse (!) 55   Temp 99.4 F (37.4 C) (Oral)   Resp 17   Ht 5\' 7"  (1.702 m)   Wt 81 kg   SpO2 100%   BMI 27.97 kg/m   Intake/Output Summary (Last 24 hours) at 06/07/2021 0911 Last data filed at 06/07/2021 0540 Gross per 24 hour  Intake 0 ml  Output 2100 ml  Net -2100 ml    Intake/Output: I/O last 3 completed shifts: In: 177 [P.O.:177] Out: 2450 [Urine:450; Other:2000]  Intake/Output this shift:  No intake/output data recorded. Weight change:  Gen:NAD CVS: RRR Resp: CTA Abd: +BS, soft, NT/ND Ext:no edema Neuro: awake, speech clear and coherent  Recent Labs  Lab 06/01/21 1429 06/02/21 0422 06/03/21 0331 06/04/21 0353 06/05/21 1608 06/06/21 1327  NA 131* 131* 133* 134* 133* 132*  K 3.4* 3.6 3.8 3.8 3.8 3.9  CL 96* 94* 99 97* 98 97*  CO2 26 25 24 24 27 27   GLUCOSE 119* 85 70 65* 98 104*  BUN 58* 61* 37* 47* 27* 33*  CREATININE 6.77* 7.17* 5.16* 5.97* 4.41* 4.80*  ALBUMIN  --  2.2*  --   --  2.3* 2.4*  CALCIUM 7.8* 8.3* 7.8* 7.9* 8.0* 8.2*  PHOS  --  4.8*  --   --  2.8 3.2   Liver Function Tests: Recent Labs  Lab 06/02/21 0422 06/05/21 1608 06/06/21 1327  ALBUMIN 2.2* 2.3* 2.4*   No results for input(s): LIPASE, AMYLASE in the last 168 hours. No results for input(s): AMMONIA in the last 168 hours. CBC: Recent Labs  Lab 06/02/21 0422 06/03/21 0331 06/04/21 0353 06/05/21 1608 06/06/21 1327  WBC 10.8* 9.3 8.8 9.0 10.4  NEUTROABS 8.1* 6.9 6.5  --   --   HGB 8.2* 8.2* 8.1* 8.7* 8.7*  HCT 25.4* 25.5* 25.2* 26.8* 27.5*  MCV 94.8 96.6 96.6 95.7 97.2  PLT 256 213 241 237 249   Cardiac Enzymes: No results for input(s): CKTOTAL, CKMB, CKMBINDEX, TROPONINI in the last 168 hours. CBG: Recent Labs  Lab 06/05/21 1201 06/05/21 1651 06/05/21 2105 06/06/21 0550 06/06/21 1156  GLUCAP  87 94 89 83 88    Iron Studies: No results for input(s): IRON, TIBC, TRANSFERRIN, FERRITIN in the last 72 hours. Studies/Results: No results found.  amLODipine  10 mg Oral Daily   budesonide  0.25 mg Nebulization BID   carvedilol  3.125 mg Oral BID WC   feeding supplement (NEPRO CARB STEADY)  237 mL Oral TID BM   ferrous sulfate  325 mg Oral Q breakfast   ipratropium-albuterol  3 mL Nebulization BID   levothyroxine  112 mcg Oral Q0600   multivitamin  1 tablet Oral QHS   pantoprazole  40 mg Oral BID   pravastatin  40 mg Oral q1800   warfarin  2.5 mg Oral ONCE-1600   Warfarin - Pharmacist Dosing Inpatient   Does not apply q1600    BMET    Component Value Date/Time   NA 132 (L) 06/06/2021 1327   NA 145 (H) 01/06/2021 1031   K 3.9 06/06/2021 1327   CL 97 (L) 06/06/2021 1327   CO2 27 06/06/2021 1327   GLUCOSE 104 (H) 06/06/2021 1327   BUN 33 (H) 06/06/2021 1327   BUN 27 01/06/2021 1031   CREATININE 4.80 (  H) 06/06/2021 1327   CREATININE 1.31 (H) 07/14/2016 0915   CALCIUM 8.2 (L) 06/06/2021 1327   GFRNONAA 12 (L) 06/06/2021 1327   GFRAA 34 (L) 09/09/2020 0930   CBC    Component Value Date/Time   WBC 10.4 06/06/2021 1327   RBC 2.83 (L) 06/06/2021 1327   HGB 8.7 (L) 06/06/2021 1327   HGB 10.5 (L) 09/09/2020 0930   HCT 27.5 (L) 06/06/2021 1327   HCT 31.6 (L) 09/09/2020 0930   PLT 249 06/06/2021 1327   PLT 231 09/09/2020 0930   MCV 97.2 06/06/2021 1327   MCV 97 09/09/2020 0930   MCH 30.7 06/06/2021 1327   MCHC 31.6 06/06/2021 1327   RDW 13.8 06/06/2021 1327   RDW 12.0 09/09/2020 0930   LYMPHSABS 0.6 (L) 06/04/2021 0353   LYMPHSABS 1.1 09/09/2020 0930   MONOABS 1.2 (H) 06/04/2021 0353   EOSABS 0.4 06/04/2021 0353   EOSABS 0.3 09/09/2020 0930   BASOSABS 0.1 06/04/2021 0353   BASOSABS 0.1 09/09/2020 0930    Assessment/Plan:   AKI/CKD stage IV, oliguric - presumably due to ischemic ATN due to volume depletion and concomitant use of ARB, aldactone, and jardiance.  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  Started on HD 05/28/21 with temp HD catheter. Continue on TTS schedule for now and follow for renal recovery.  S/p TDC placement 06/02/21 w/ IR No signs of renal recovery, will need outpatient HD for AKI. 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 ABLA and anemia of CKD stage IV - transfuse prn.  No ESA due to pelvic mass. A fib - coumadin CKD-BMD - stable COPD - pt with bilateral wheezing, nebs per primary svc. Disposition - CIR  Gean Quint, MD [facility]

## 2021-06-07 NOTE — Discharge Instructions (Addendum)
Physician Discharge Summary  Patient ID: [name] [name] MRN: [id number] DOB/AGE: Feb 13, 1941 80 y.o.  Admit date: 06/05/2021 Discharge date: 06/13/2021  Discharge Diagnoses:  Principal Problem:   Debility   Discharged Condition: Stable  Significant Diagnostic Studies: US RENAL  Result Date: 05/23/2021 CLINICAL DATA:  "Bladder neck obstruction". EXAM: RENAL / URINARY TRACT ULTRASOUND COMPLETE COMPARISON:  05/13/2021 CT FINDINGS: Right Kidney: Renal measurements: 11.0 by 6.6 x 5.6 cm = volume: 211 mL. No hydronephrosis. A lower pole 1.6 cm markedly hypoechoic to anechoic lesion. Left Kidney: Renal measurements: 8.0 x 3.1 x 2.9 cm = volume: 38.1 mL. Moderately atrophic. No hydronephrosis. Bladder: Appears normal for degree of bladder distention. Other: None. IMPRESSION: 1. Left renal atrophy. 2. Lower pole right renal cyst or minimally complex cyst. Electronically Signed   By: Abigail Miyamoto M.D.   On: 05/23/2021 08:32   IR Fluoro Guide CV Line Right  Result Date: 05/27/2021 INDICATION: 80 year old male with acute kidney injury on chronic kidney disease requiring central venous access for hemodialysis. EXAM: NON-TUNNELED CENTRAL VENOUS HEMODIALYSIS CATHETER PLACEMENT WITH ULTRASOUND AND FLUOROSCOPIC GUIDANCE COMPARISON:  None. MEDICATIONS: None FLUOROSCOPY TIME:  0 minutes, 6 seconds (2 mGy) COMPLICATIONS: None immediate. PROCEDURE: Informed written consent was obtained from the patient after a discussion of the risks, benefits, and alternatives to treatment. Questions regarding the procedure were encouraged and answered. The right neck and chest were prepped with chlorhexidine in a sterile fashion, and a sterile drape was applied covering the operative field. Maximum barrier sterile technique with sterile gowns and gloves were used for the procedure. A timeout was performed prior to the initiation of the procedure. After the overlying soft tissues were anesthetized, a small venotomy incision was  created and a micropuncture kit was utilized to access the internal jugular vein. Real-time ultrasound guidance was utilized for vascular access including the acquisition of a permanent ultrasound image documenting patency of the accessed vessel. The microwire was utilized to measure appropriate catheter length. A stiff glidewire was advanced to the level of the IVC. Under fluoroscopic guidance, the venotomy was serially dilated, ultimately allowing placement of a 12 French 24 cm temporary Trialysis catheter with tip ultimately terminating within the superior aspect of the right atrium. Final catheter positioning was confirmed and documented with a spot radiographic image. The catheter aspirates and flushes normally. The catheter was flushed with appropriate volume heparin dwells. The catheter exit site was secured with a 0-Prolene retention suture. A dressing was placed. The patient tolerated the procedure well without immediate post procedural complication. IMPRESSION: Successful placement of a right internal jugular approach 24 cm temporary dialysis catheter with tip terminating with in the superior aspect of the right atrium. The catheter is ready for immediate use. PLAN: This catheter may be converted to a tunneled dialysis catheter at a later date as indicated. Ruthann Cancer, MD [facility] Radiology Electronically Signed   By: Ruthann Cancer M.D.   On: 05/27/2021 11:04   IR US Guide Vasc Access Right  Result Date: 05/27/2021 INDICATION: 80 year old male with acute kidney injury on chronic kidney disease requiring central venous access for hemodialysis. EXAM: NON-TUNNELED CENTRAL VENOUS HEMODIALYSIS CATHETER PLACEMENT WITH ULTRASOUND AND FLUOROSCOPIC GUIDANCE COMPARISON:  None. MEDICATIONS: None FLUOROSCOPY TIME:  0 minutes, 6 seconds (2 mGy) COMPLICATIONS: None immediate. PROCEDURE: Informed written consent was obtained from the patient after a discussion of  the risks, benefits, and alternatives to treatment. Questions regarding the procedure were encouraged and answered. The right neck and chest  were prepped with chlorhexidine in a sterile fashion, and a sterile drape was applied covering the operative field. Maximum barrier sterile technique with sterile gowns and gloves were used for the procedure. A timeout was performed prior to the initiation of the procedure. After the overlying soft tissues were anesthetized, a small venotomy incision was created and a micropuncture kit was utilized to access the internal jugular vein. Real-time ultrasound guidance was utilized for vascular access including the acquisition of a permanent ultrasound image documenting patency of the accessed vessel. The microwire was utilized to measure appropriate catheter length. A stiff glidewire was advanced to the level of the IVC. Under fluoroscopic guidance, the venotomy was serially dilated, ultimately allowing placement of a 12 French 24 cm temporary Trialysis catheter with tip ultimately terminating within the superior aspect of the right atrium. Final catheter positioning was confirmed and documented with a spot radiographic image. The catheter aspirates and flushes normally. The catheter was flushed with appropriate volume heparin dwells. The catheter exit site was secured with a 0-Prolene retention suture. A dressing was placed. The patient tolerated the procedure well without immediate post procedural complication. IMPRESSION: Successful placement of a right internal jugular approach 24 cm temporary dialysis catheter with tip terminating with in the superior aspect of the right atrium. The catheter is ready for immediate use. PLAN: This catheter may be converted to a tunneled dialysis catheter at a later date as indicated. Ruthann Cancer, MD [facility] Radiology Electronically Signed   By: Ruthann Cancer M.D.   On: 05/27/2021 11:04    CT Renal Stone Study  Result Date: 05/13/2021 CLINICAL DATA:  Hematuria, back pain EXAM: CT ABDOMEN AND PELVIS WITHOUT CONTRAST TECHNIQUE: Multidetector CT imaging of the abdomen and pelvis was performed following the standard protocol without IV contrast. COMPARISON:  01/06/2015 FINDINGS: Lower chest: Small nodule along the left major fissure measures 4 mm. Right lower lobe nodule on the 1st image posteriorly measures 6 mm. Scarring in the lung bases. No effusions. Coronary artery and aortic calcifications. Mild cardiomegaly. Hepatobiliary: No focal hepatic abnormality. Gallbladder unremarkable. Pancreas: No focal abnormality or ductal dilatation. Spleen: No focal abnormality.  Normal size. Adrenals/Urinary Tract: There is mild right hydronephrosis. High-density material seen within the renal collecting system and proximal ureter, likely blood/hematuria. No visible obstructing stones. The right ureter gradually tapers to normal caliber in the proximal ureter. Bilateral renal vascular calcifications. Adrenal glands and urinary bladder unremarkable. Left kidney is atrophic. Stomach/Bowel: Sigmoid diverticulosis. No active diverticulitis. Stomach and small bowel decompressed, unremarkable. Vascular/Lymphatic: Diffuse aortoiliac atherosclerosis. No evidence of aneurysm or adenopathy. Reproductive: Mildly prominent prostate with central calcifications. Other: No free fluid or free air. Musculoskeletal: No acute bony abnormality. IMPRESSION: Mild right hydronephrosis with gradual tapering of the proximal right ureter. No visible cause of the hydronephrosis. High-density material seen within the right renal collecting system and proximal ureter compatible with hematuria/blood. Atrophic left kidney. Sigmoid diverticulosis.  No active diverticulitis. Bilateral lower lobe pulmonary nodules. Non-contrast chest CT at 3-6 months is recommended. If the nodules are stable at time of repeat CT, then future CT at 18-24 months  (from today's scan) is considered optional for low-risk patients, but is recommended for high-risk patients. This recommendation follows the consensus statement: Guidelines for Management of Incidental Pulmonary Nodules Detected on CT Images: From the Fleischner Society 2017; Radiology 2017; 284:228-243. Electronically Signed   By: Rolm Baptise M.D.   On: 05/13/2021 18:30   VAS [place] UPPER EXTREMITY VENOUS DUPLEX  Result Date: 05/27/2021 UPPER VENOUS STUDY  Patient Name:  CORBITT CLOKE  Date of Exam:   05/27/2021 Medical Rec #: [id number]        Accession #:    [id number] Date of Birth: December 03, 1940        Patient Gender: M Patient Age:   34 years Exam Location:  [facility] Procedure:      VAS [place] UPPER EXTREMITY VENOUS DUPLEX Referring Phys: Shawna Clamp --------------------------------------------------------------------------------  Indications: Swelling Comparison Study: no prior Performing Technologist: Archie Patten RVS  Examination Guidelines: A complete evaluation includes B-mode imaging, spectral Doppler, color Doppler, and power Doppler as needed of all accessible portions of each vessel. Bilateral testing is considered an integral part of a complete examination. Limited examinations for reoccurring indications may be performed as noted.  Right Findings: +----------+------------+---------+-----------+----------+-------+ RIGHT     CompressiblePhasicitySpontaneousPropertiesSummary +----------+------------+---------+-----------+----------+-------+ Subclavian               Yes       Yes                      +----------+------------+---------+-----------+----------+-------+  Left Findings: +----------+------------+---------+-----------+----------+-------+ LEFT      CompressiblePhasicitySpontaneousPropertiesSummary +----------+------------+---------+-----------+----------+-------+ IJV           Full       Yes       Yes                       +----------+------------+---------+-----------+----------+-------+ Subclavian    Full       Yes       Yes                      +----------+------------+---------+-----------+----------+-------+ Axillary      Full       Yes       Yes                      +----------+------------+---------+-----------+----------+-------+ Brachial      Full       Yes       Yes                      +----------+------------+---------+-----------+----------+-------+ Radial        Full                                          +----------+------------+---------+-----------+----------+-------+ Ulnar         Full                                          +----------+------------+---------+-----------+----------+-------+ Cephalic      Full                                          +----------+------------+---------+-----------+----------+-------+ Basilic       Full                                          +----------+------------+---------+-----------+----------+-------+  Summary:  Right: No evidence of thrombosis in the subclavian.  Left: No evidence of  deep vein thrombosis in the upper extremity. No evidence of superficial vein thrombosis in the upper extremity.  *See table(s) above for measurements and observations.  Diagnosing physician: Harold Barban MD Electronically signed by Harold Barban MD on 05/27/2021 at 8:06:09 PM.    Final     Labs:  Basic Metabolic Panel: Recent Labs  Lab 06/01/21 1429 06/02/21 0422 06/03/21 0331 06/04/21 0353 06/05/21 1608 06/06/21 1327  NA 131* 131* 133* 134* 133* 132*  K 3.4* 3.6 3.8 3.8 3.8 3.9  CL 96* 94* 99 97* 98 97*  CO2 _0 GLUCOSE 119* 85 70 65* 98 104*  BUN 58* 61* 37* 47* 27* 33*  CREATININE 6.77* 7.17* 5.16* 5.97* 4.41* 4.80*  CALCIUM 7.8* 8.3* 7.8* 7.9* 8.0* 8.2*  MG  --   --   --  2.1  --   --   PHOS  --  4.8*  --   --  2.8 3.2    CBC: Recent Labs  Lab 06/02/21 0422 06/03/21 0331 06/04/21 0353 06/05/21 1608  06/06/21 1327  WBC 10.8* 9.3 8.8 9.0 10.4  NEUTROABS 8.1* 6.9 6.5  --   --   HGB 8.2* 8.2* 8.1* 8.7* 8.7*  HCT 25.4* 25.5* 25.2* 26.8* 27.5*  MCV 94.8 96.6 96.6 95.7 97.2  PLT 256 213 241 237 249    CBG: Recent Labs  Lab 06/05/21 1201 06/05/21 1651 06/05/21 2105 06/06/21 0550 06/06/21 1156  GLUCAP 87 94 89 83 88    Brief HPI:   Bexley R Vera is a 80 y.o. male    Hospital Course: Wilmer R Corella was admitted to rehab 06/05/2021 for inpatient therapies to consist of PT, ST and OT at least three hours five days a week. Past admission physiatrist, therapy team and rehab RN have worked together to provide customized collaborative inpatient rehab.   Blood pressures were monitored on TID basis and   Diabetes has been monitored with ac/hs CBG checks and SSI was use prn for tighter BS control.    Rehab course: During patient's stay in rehab weekly team conferences were held to monitor patient's progress, set goals and discuss barriers to discharge. At admission, patient required  He/She  has had improvement in activity tolerance, balance, postural control as well as ability to compensate for deficits. He/She has had improvement in functional use RUE/LUE  and RLE/LLE as well as improvement in awareness       Disposition: Discharged to home    Diet: Regular  Special Instructions: No driving smoking or alcohol     Follow-up Information     Raulkar, Clide Deutscher, MD Follow up.   Specialty: Physical Medicine and Rehabilitation Why: Office to call for appointment Contact information: [address] [place] [address] [phone]         Rosita Fire, MD Follow up.   Specialties: Nephrology, Internal Medicine Why: Call for appointment Contact information: [place] [address] [phone]         Alexis Frock, MD Follow up.   Specialty: Urology Why: Call for appointment Contact information: [place]  [address] [phone]                 Signed: Cathlyn Parsons 06/07/2021, 11:13 PM  Inpatient Rehab Discharge Instructions  FINLAY GODBEE Discharge date and time: No discharge date for patient encounter.   Activities/Precautions/ Functional Status: Activity: As tolerated Diet: Regular Wound Care: Routine  skin checks Functional status:  ___ No restrictions     ___ Walk up steps independently ___ 24/7 supervision/assistance   ___ Walk up steps with assistance ___ Intermittent supervision/assistance  ___ Bathe/dress independently ___ Walk with walker     ___ Bathe/dress with assistance ___ Walk Independently    ___ Shower independently ___ Walk with assistance    ___ Shower with assistance ___ No alcohol     ___ Return to work/school ________  Special Instructions:    My questions have been answered and I understand these instructions. I will adhere to these goals and the provided educational materials after my discharge from the hospital.  Patient/Caregiver Signature _______________________________ Date __________  Clinician Signature _______________________________________ Date __________  Please bring this form and your medication list with you to all your follow-up doctor's appointments.  Inpatient Rehab Discharge Instructions  [place] [place] Discharge date and time: No discharge date for patient encounter.   Activities/Precautions/ Functional Status: Activity: As tolerated Diet: Regular Wound Care: Routine skin checks Functional status:  ___ No restrictions     ___ Walk up steps independently ___ 24/7 supervision/assistance   ___ Walk up steps with assistance ___ Intermittent supervision/assistance  ___ Bathe/dress independently ___ Walk with walker     ___ Bathe/dress with assistance ___ Walk Independently    ___ Shower independently ___ Walk with assistance    ___ Shower with assistance ___ No alcohol     ___ Return to work/school ________  Special  Instructions:    My questions have been answered and I understand these instructions. I will adhere to these goals and the provided educational materials after my discharge from the hospital.  Patient/Caregiver Signature _______________________________ Date __________  Clinician Signature _______________________________________ Date __________  Please bring this form and your medication list with you to all your follow-up doctor's appointments.  Inpatient Rehab Discharge Instructions  QUINTERRIUS ERRINGTON Discharge date and time: No discharge date for patient encounter.   Activities/Precautions/ Functional Status: Activity: As tolerated Diet: Regular Wound Care: Routine skin checksSTROKE/TIA DISCHARGE INSTRUCTIONS SMOKING Cigarette smoking nearly doubles your risk of having a stroke & is the single most alterable risk factor  If you smoke or have smoked in the last 12 months, you are advised to quit smoking for your health. Most of the excess cardiovascular risk related to smoking disappears within a year of stopping. Ask you doctor about anti-smoking medications [place] Quit Line: 1-800-QUIT NOW Free Smoking Cessation Classes [phone]  CHOLESTEROL Know your levels; limit fat & cholesterol in your diet  Lipid Panel     Component Value Date/Time   CHOL 96 (L) 09/09/2020 0930   TRIG 59 09/09/2020 0930   HDL 41 09/09/2020 0930   CHOLHDL 2.3 09/09/2020 0930   CHOLHDL 3.6 09/06/2016 1124   VLDL 17 09/06/2016 1124   LDLCALC 41 09/09/2020 0930     Many patients benefit from treatment even if their cholesterol is at goal. Goal: Total Cholesterol (CHOL) less than 160 Goal:  Triglycerides (TRIG) less than 150 Goal:  HDL greater than 40 Goal:  LDL (LDLCALC) less than 100   BLOOD PRESSURE American Stroke Association blood pressure target is less that 120/80 mm/Hg  Your discharge blood pressure is:  BP: (!) 151/49 Monitor your blood pressure Limit your salt and alcohol intake Many  individuals will require more than one medication for high blood pressure  DIABETES (A1c is a blood sugar average for last 3 months) Goal HGBA1c is under 7% (HBGA1c is blood sugar  average for last 3 months)  Diabetes: No known diagnosis of diabetes    Lab Results  Component Value Date   HGBA1C 5.4 05/23/2021    Your HGBA1c can be lowered with medications, healthy diet, and exercise. Check your blood sugar as directed by your physician Call your physician if you experience unexplained or low blood sugars.  PHYSICAL ACTIVITY/REHABILITATION Goal is 30 minutes at least 4 days per week  Activity: Increase activity slowly, Therapies: Physical Therapy: Home Health Return to work:  Activity decreases your risk of heart attack and stroke and makes your heart stronger.  It helps control your weight and blood pressure; helps you relax and can improve your mood. Participate in a regular exercise program. Talk with your doctor about the best form of exercise for you (dancing, walking, swimming, cycling).  DIET/WEIGHT Goal is to maintain a healthy weight  Your discharge diet is:  Diet Order             Diet renal with fluid restriction Fluid restriction: 1200 mL Fluid; Room service appropriate? Yes; Fluid consistency: Thin  Diet effective now                   liquids Your height is:  Height: _0  (170.2 cm) Your current weight is: Weight: 73.9 kg Your Body Mass Index (BMI) is:  BMI (Calculated): 25.51 Following the type of diet specifically designed for you will help prevent another stroke. Your goal weight range is:   Your goal Body Mass Index (BMI) is 19-24. Healthy food habits can help reduce 3 risk factors for stroke:  High cholesterol, hypertension, and excess weight.  RESOURCES Stroke/Support Group:  Call [phone]   STROKE EDUCATION PROVIDED/REVIEWED AND GIVEN TO PATIENT Stroke warning signs and symptoms How to activate emergency medical system (call 911). Medications  prescribed at discharge. Need for follow-up after discharge. Personal risk factors for stroke. Pneumonia vaccine given: No Flu vaccine given: No My questions have been answered, the writing is legible, and I understand these instructions.  I will adhere to these goals & educational materials that have been provided to me after my discharge from the hospital.     Functional status:  ___ No restrictions     ___ Walk up steps independently ___ 24/7 supervision/assistance   ___ Walk up steps with assistance ___ Intermittent supervision/assistance  ___ Bathe/dress independently ___ Walk with walker     ___ Bathe/dress with assistance ___ Walk Independently    ___ Shower independently ___ Walk with assistance    ___ Shower with assistance ___ No alcohol     ___ Return to work/school ________  Special Instructions:    My questions have been answered and I understand these instructions. I will adhere to these goals and the provided educational materials after my discharge from the hospital.  Patient/Caregiver Signature _______________________________ Date __________  Clinician Signature _______________________________________ Date __________  Please bring this form and your medication list with you to all your follow-up doctor's appointments.  STROKE/TIA DISCHARGE INSTRUCTIONS SMOKING Cigarette smoking nearly doubles your risk of having a stroke & is the single most alterable risk factor  If you smoke or have smoked in the last 12 months, you are advised to quit smoking for your health. Most of the excess cardiovascular risk related to smoking disappears within a year of stopping. Ask you doctor about anti-smoking medications [place] Quit Line: 1-800-QUIT NOW Free Smoking Cessation Classes [phone]  CHOLESTEROL Know your levels; limit fat & cholesterol in your  diet  Lipid Panel     Component Value Date/Time   CHOL 96 (L) 09/09/2020 0930   TRIG 59 09/09/2020 0930   HDL 41 09/09/2020  0930   CHOLHDL 2.3 09/09/2020 0930   CHOLHDL 3.6 09/06/2016 1124   VLDL 17 09/06/2016 1124   LDLCALC 41 09/09/2020 0930     Many patients benefit from treatment even if their cholesterol is at goal. Goal: Total Cholesterol (CHOL) less than 160 Goal:  Triglycerides (TRIG) less than 150 Goal:  HDL greater than 40 Goal:  LDL (LDLCALC) less than 100   BLOOD PRESSURE American Stroke Association blood pressure target is less that 120/80 mm/Hg  Your discharge blood pressure is:  BP: (!) 152/49 Monitor your blood pressure Limit your salt and alcohol intake Many individuals will require more than one medication for high blood pressure  DIABETES (A1c is a blood sugar average for last 3 months) Goal HGBA1c is under 7% (HBGA1c is blood sugar average for last 3 months)  Diabetes: No known diagnosis of diabetes    Lab Results  Component Value Date   HGBA1C 5.4 05/23/2021    Your HGBA1c can be lowered with medications, healthy diet, and exercise. Check your blood sugar as directed by your physician Call your physician if you experience unexplained or low blood sugars.  PHYSICAL ACTIVITY/REHABILITATION Goal is 30 minutes at least 4 days per week  Activity: Increase activity slowly, Therapies: Physical Therapy: Home Health Return to work:  Activity decreases your risk of heart attack and stroke and makes your heart stronger.  It helps control your weight and blood pressure; helps you relax and can improve your mood. Participate in a regular exercise program. Talk with your doctor about the best form of exercise for you (dancing, walking, swimming, cycling).  DIET/WEIGHT Goal is to maintain a healthy weight  Your discharge diet is:  Diet Order             Diet renal with fluid restriction Fluid restriction: 1200 mL Fluid; Room service appropriate? Yes; Fluid consistency: Thin  Diet effective now                   liquids Your height is:  Height: _0  (170.2 cm) Your current weight is:  Weight: 81 kg Your Body Mass Index (BMI) is:  BMI (Calculated): 27.96 Following the type of diet specifically designed for you will help prevent another stroke. Your goal weight range is:   Your goal Body Mass Index (BMI) is 19-24. Healthy food habits can help reduce 3 risk factors for stroke:  High cholesterol, hypertension, and excess weight.  RESOURCES Stroke/Support Group:  Call [phone]   STROKE EDUCATION PROVIDED/REVIEWED AND GIVEN TO PATIENT Stroke warning signs and symptoms How to activate emergency medical system (call 911). Medications prescribed at discharge. Need for follow-up after discharge. Personal risk factors for stroke. Pneumonia vaccine given: No Flu vaccine given: No My questions have been answered, the writing is legible, and I understand these instructions.  I will adhere to these goals & educational materials that have been provided to me after my discharge from the hospital.    Inpatient Rehab Discharge Instructions  [place] Discharge date and time: No discharge date for patient encounter.   Activities/Precautions/ Functional Status: Activity: As tolerated Diet: Renal diet Wound Care: Routine skin checks Functional status:  ___ No restrictions     ___ Walk up steps independently ___ 24/7 supervision/assistance   ___ Walk up steps with assistance ___  Intermittent supervision/assistance  ___ Bathe/dress independently ___ Walk with walker     __x_ Bathe/dress with assistance ___ Walk Independently    ___ Shower independently ___ Walk with assistance    ___ Shower with assistance ___ No alcohol     ___ Return to work/school ________  Special Instructions: Continue hemodialysis as directed  No driving smoking or alcohol  Follow-up urology services Dr. Alexis Frock [phone] to evaluate hematuria as well as renal mass  Continue to hold chronic Coumadin until further notice and evaluation completed for hematuria   COMMUNITY REFERRALS UPON  DISCHARGE:    Home Health:   [place]  Medical Equipment/Items Ordered:PORTABLE O2 TANK                                                 Agency/Supplier: [employer]  [phone]   My questions have been answered and I understand these instructions. I will adhere to these goals and the provided educational materials after my discharge from the hospital.  Patient/Caregiver Signature _______________________________ Date __________  Clinician Signature _______________________________________ Date __________  Please bring this form and your medication list with you to all your follow-up doctor's appointments.

## 2021-06-07 NOTE — Progress Notes (Signed)
Nurse spoke with MD, agreed it may be related to foley trauma. Pt updated on plan of action: continue to intake fluid and monitor output, if hematuria remains in am, Urology will be notified. Pt verbalized understanding. Pt also given PRN neb for SOB and expository wheezing BIL .

## 2021-06-07 NOTE — Progress Notes (Signed)
Pt voided 56ml of bloody urine. MD notified.

## 2021-06-08 ENCOUNTER — Inpatient Hospital Stay (HOSPITAL_COMMUNITY): Payer: Medicare Other

## 2021-06-08 DIAGNOSIS — R5381 Other malaise: Secondary | ICD-10-CM | POA: Diagnosis not present

## 2021-06-08 LAB — CBC
HCT: 26.4 % — ABNORMAL LOW (ref 39.0–52.0)
Hemoglobin: 8.5 g/dL — ABNORMAL LOW (ref 13.0–17.0)
MCH: 31 pg (ref 26.0–34.0)
MCHC: 32.2 g/dL (ref 30.0–36.0)
MCV: 96.4 fL (ref 80.0–100.0)
Platelets: 226 10*3/uL (ref 150–400)
RBC: 2.74 MIL/uL — ABNORMAL LOW (ref 4.22–5.81)
RDW: 13.9 % (ref 11.5–15.5)
WBC: 11.7 10*3/uL — ABNORMAL HIGH (ref 4.0–10.5)
nRBC: 0.2 % (ref 0.0–0.2)

## 2021-06-08 LAB — URINALYSIS, ROUTINE W REFLEX MICROSCOPIC

## 2021-06-08 LAB — URINALYSIS, MICROSCOPIC (REFLEX): RBC / HPF: >50 RBC/hpf (ref 0–5)

## 2021-06-08 LAB — PROTIME-INR
INR: 1.7 — ABNORMAL HIGH (ref 0.8–1.2)
Prothrombin Time: 19.9 seconds — ABNORMAL HIGH (ref 11.4–15.2)

## 2021-06-08 MED ORDER — ALBUTEROL SULFATE (2.5 MG/3ML) 0.083% IN NEBU
2.5000 mg | INHALATION_SOLUTION | RESPIRATORY_TRACT | Status: DC | PRN
  Administered 2021-06-09 – 2021-06-13 (×11): 2.5 mg via RESPIRATORY_TRACT
  Filled 2021-06-08 (×12): qty 3

## 2021-06-08 MED ORDER — CHLORHEXIDINE GLUCONATE CLOTH 2 % EX PADS
6.0000 | MEDICATED_PAD | Freq: Every day | CUTANEOUS | Status: DC
  Administered 2021-06-08 – 2021-06-12 (×4): 6 via TOPICAL

## 2021-06-08 MED ORDER — WARFARIN SODIUM 2.5 MG PO TABS
2.5000 mg | ORAL_TABLET | Freq: Once | ORAL | Status: AC
  Administered 2021-06-08: 2.5 mg via ORAL
  Filled 2021-06-08 (×2): qty 1

## 2021-06-08 NOTE — IPOC Note (Signed)
Overall Plan of Care [facility]) Patient Details Name: [name] [name] MRN: [id number] DOB: 02-25-1941  Admitting Diagnosis: [place] [facility] Problems: Principal Problem:   Debility     Functional Problem List: Nursing Bladder, Endurance, Medication Management, Pain, Safety  PT Balance, Edema, Endurance, Safety, Skin Integrity  OT Balance, Endurance, Motor  SLP    TR         Basic ADL's: OT Grooming, Bathing, Dressing, Toileting     Advanced  ADL's: OT       Transfers: PT Bed to Chair, Car, Bed Mobility, Furniture, Floor  OT Toilet, Metallurgist: PT Ambulation, Emergency planning/management officer, Stairs     Additional Impairments: OT None  SLP        TR      Anticipated Outcomes Item Anticipated Outcome  Self Feeding independent  Swallowing      Basic self-care  supervision  Toileting  supervision   Bathroom Transfers supervision  Bowel/Bladder  supervision  Transfers  Supervision assist with LRAD  Locomotion  Ambulatory with Supervision assist and LRAD  Communication     Cognition     Pain  <3  Safety/Judgment  supervision and no falls   Therapy Plan: PT Intensity: Minimum of 1-2 x/day ,45 to 90 minutes PT Frequency: 5 out of 7 days PT Duration Estimated Length of Stay: 6-9 days OT Intensity: Minimum of 1-2 x/day, 45 to 90 minutes OT Frequency: 5 out of 7 days OT Duration/Estimated Length of Stay: 7 days     Due to the current state of emergency, patients may not be receiving their 3-hours of Medicare-mandated therapy.   Team Interventions: Nursing Interventions Patient/Family Education, Bladder Management, Disease Management/Prevention, Pain Management, Medication Management, Discharge Planning  PT interventions Ambulation/gait training, Balance/vestibular training, Cognitive remediation/compensation, Disease management/prevention, Discharge planning, Community reintegration, DME/adaptive equipment instruction, Functional electrical  stimulation, Functional mobility training, Patient/family education, Pain management, Neuromuscular re-education, Psychosocial support, Skin care/wound management, Splinting/orthotics, Therapeutic Exercise, Therapeutic Activities, Stair training, UE/LE Strength taining/ROM, UE/LE Coordination activities, Visual/perceptual remediation/compensation, Wheelchair propulsion/positioning  OT Interventions Balance/vestibular training, Discharge planning, Functional mobility training, DME/adaptive equipment instruction, Pain management, Patient/family education, Psychosocial support, Therapeutic Activities, Self Care/advanced ADL retraining, Therapeutic Exercise, UE/LE Strength taining/ROM, UE/LE Coordination activities  SLP Interventions    TR Interventions    SW/CM Interventions Discharge Planning, Psychosocial Support, Patient/Family Education   Barriers to Discharge MD  Medical stability, Home enviroment access/loayout, Lack of/limited family support, Weight, Hemodialysis, and New oxygen  Nursing Decreased caregiver support, Home environment access/layout, Incontinence, Lack of/limited family support, Weight, Hemodialysis, Medication compliance Lives in 1 level home with wife and 3 steps to enter. Left rail available. Wife to be caregiver at discharge and available 24/7.  PT Inaccessible home environment, Home environment access/layout, Other (comments), New oxygen    OT      SLP      SW       Team Discharge Planning: Destination: PT-Home ,OT- Home , SLP-  Projected Follow-up: PT-Home health PT, OT-  Home health OT, SLP-  Projected Equipment Needs: PT-Rolling walker with 5" wheels, OT- To be determined, SLP-  Equipment Details: PT- , OT-need to discuss further with pt and his wife Patient/family involved in discharge planning: PT- Patient,  OT-Patient, SLP-   MD ELOS: 7-10 days Medical Rehab Prognosis:  Fair Assessment: Pt is an 80 yr old male with debility due to new HD/ESRD?; as well as new  CHF exacerbation and hematuria due to pelvic mass?  Goals- supervision- but  doing worse today, so might need longer due to more medically complicated than when admitted.     See Team Conference Notes for weekly updates to the plan of care

## 2021-06-08 NOTE — Progress Notes (Addendum)
PROGRESS NOTE   Subjective/Complaints:  Having a "rough time"- having blood in urine again.  Also feeling SOB- they gave    ROS:  Pt denies  abd pain, CP, N/V/C/D, and vision changes    Objective:   DG CHEST PORT 1 VIEW  Result Date: 06/08/2021 CLINICAL DATA:  Shortness of breath. EXAM: PORTABLE CHEST 1 VIEW COMPARISON:  Two-view chest x-ray 07/14/2020 FINDINGS: Heart is enlarged. Atherosclerotic changes are noted at the aortic arch. Tunneled right IJ dialysis catheter terminates at the superior cavoatrial junction. Bilateral pleural effusions are present, right greater than left. Bilateral interstitial and airspace disease noted, right greater than left. IMPRESSION: Cardiomegaly with bilateral pleural effusions and bilateral interstitial and airspace disease, right greater than left. Findings are consistent with congestive heart failure. Electronically Signed   By: San Morelle M.D.   On: 06/08/2021 10:22   Recent Labs    06/06/21 1327 06/08/21 0816  WBC 10.4 11.7*  HGB 8.7* 8.5*  HCT 27.5* 26.4*  PLT 249 226   Recent Labs    06/05/21 1608 06/06/21 1327  NA 133* 132*  K 3.8 3.9  CL 98 97*  CO2 27 27  GLUCOSE 98 104*  BUN 27* 33*  CREATININE 4.41* 4.80*  CALCIUM 8.0* 8.2*    Intake/Output Summary (Last 24 hours) at 06/08/2021 1040 Last data filed at 06/08/2021 0300 Gross per 24 hour  Intake 920 ml  Output 600 ml  Net 320 ml        Physical Exam: Vital Signs Blood pressure (!) 159/54, pulse 60, temperature 98.3 F (36.8 C), temperature source Oral, resp. rate 18, height 5\' 7"  (1.702 m), weight 77.2 kg, SpO2 98 %. Gen: no distress, normal appearing HEENT: oral mucosa pink and moist, NCAT Cardio: Reg rate Pulmonary:     Effort: Pulmonary effort is normal.     Breath sounds: Normal breath sounds.     Comments: + , +wheezing Abdominal:     General: Abdomen is flat. There is no distension.      Comments: Bowel sounds hypoactive  Musculoskeletal:     Cervical back: Normal range of motion and neck supple.     Comments: No edema or tenderness in extremities  Skin:    General: Skin is warm and dry.  Neurological:     Mental Status: He is alert.     Comments: Alert and oriented Makes eye contact with examiner. Motor: Bilateral upper extremities: 4/5 proximal distal Right lower extremity: 4/5 proximal distal Left lower extremity: 4-/5 proximal distal  Psychiatric:        Mood and Affect: Mood normal.        Behavior: Behavior normal.    Assessment/Plan: 1. Functional deficits which require 3+ hours per day of interdisciplinary therapy in a comprehensive inpatient rehab setting. Physiatrist is providing close team supervision and 24 hour management of active medical problems listed below. Physiatrist and rehab team continue to assess barriers to discharge/monitor patient progress toward functional and medical goals  Care Tool:  Bathing  Bathing activity did not occur: Refused           Bathing assist       Upper Body Dressing/Undressing  Upper body dressing   What is the patient wearing?: Hospital gown only    Upper body assist Assist Level: Minimal Assistance - Patient > 75%    Lower Body Dressing/Undressing Lower body dressing      What is the patient wearing?: Incontinence brief, Pants     Lower body assist Assist for lower body dressing: Moderate Assistance - Patient 50 - 74%     Toileting Toileting Toileting Activity did not occur Landscape architect and hygiene only): Refused  Toileting assist Assist for toileting: Minimal Assistance - Patient > 75% Assistive Device Comment: urinal   Transfers Chair/bed transfer  Transfers assist     Chair/bed transfer assist level: Minimal Assistance - Patient > 75%     Locomotion Ambulation   Ambulation assist      Assist level: Supervision/Verbal cueing Assistive device: Walker-rolling Max  distance: 5   Walk 10 feet activity   Assist     Assist level: Minimal Assistance - Patient > 75% Assistive device: Walker-rolling   Walk 50 feet activity   Assist Walk 50 feet with 2 turns activity did not occur: Safety/medical concerns         Walk 150 feet activity   Assist Walk 150 feet activity did not occur: Safety/medical concerns         Walk 10 feet on uneven surface  activity   Assist Walk 10 feet on uneven surfaces activity did not occur: Safety/medical concerns         Wheelchair     Assist Is the patient using a wheelchair?: Yes      Wheelchair assist level: Supervision/Verbal cueing Max wheelchair distance: 50    Wheelchair 50 feet with 2 turns activity    Assist        Assist Level: Supervision/Verbal cueing   Wheelchair 150 feet activity     Assist      Assist Level: Moderate Assistance - Patient 50 - 74%   Blood pressure (!) 159/54, pulse 60, temperature 98.3 F (36.8 C), temperature source Oral, resp. rate 18, height 5\' 7"  (1.702 m), weight 77.2 kg, SpO2 98 %.    Medical Problem List and Plan: 1.   Debility secondary to AKI on CKD/multi medical             -patient may not shower             -ELOS/Goals: 4-6 days/supervision/mod I            Continue CIR/PT and OT- as tolerated today- feels "really bad".  2.  Antithrombotics: -DVT/anticoagulation:  Pharmaceutical: Coumadin             -antiplatelet therapy: N/A 3. Pain Management: Tylenol as needed 4. Mood: Provide emotional support             -antipsychotic agents: N/A 5. Neuropsych: This patient is capable of making decisions on his own behalf. 6. Skin/Wound Care: Routine skin checks 7. Fluids/Electrolytes/Nutrition: Routine in and outs 8.  AKI on CKD.  Hemodialysis initiated.  Follow-up nephrology services             Labs with HD  9/5- spoke with renal- will do HD today as well as scheduled T/H/S due to CHF exacerbation. .  9.  Acute on chronic  anemia.  Patient did receive a total of 3 units packed red blood cells.  Continue iron supplement  9/5- Hb 8.5- stable- con't to monitor  CBC ordered 10.  History of COPD/asthma.  Chronically on 2.5-3 L oxygen nightly. Ordered incentive spirometer to improve breathing strength and ability over time. 11.  Diastolic congestive heart failure.  Monitor for any signs of fluid overload.  Currently on Coreg 3.125 mg twice daily and amlodipine.  Hydralazine Entresto and Aldactone currently on hold  9/5- having CHF exacerbation based on CXR results today- (ordered 2 view CXR)- having HD again today per renal. 12.  Atrial fibrillation.  Continue Coumadin.  Cardiac rate controlled. INR reviewed: 1.8 on 9/4  9/5- INR 1.7- per pharmacy 13.  Hyperlipidemia.  Pravachol 14.  GERD.  Continue Protonix 15.  Hypothyroidism: Continue Synthroid 16. Overweight BMI 27.97: provide dietary education.  17. Bradycardia: decrease Coreg to daily. 18. Hematuria  9/5- has pelvic mass- could be cause- has restarted as of last night- Hb stable- so will con't to monitor- and call Urology if gets worse- mainly clots- U/A and Cx ordered per nursing requirements due to clots.   I spent a total of 39 minutes on total care- >50% coordination of care- speaking with pt/wife twice as well as reading CXR and speaking with renal.    LOS: 3 days A FACE TO FACE EVALUATION WAS PERFORMED  Chasidy Janak 06/08/2021, 10:40 AM

## 2021-06-08 NOTE — Progress Notes (Signed)
ANTICOAGULATION CONSULT NOTE - Follow Up Consult  Pharmacy Consult for Warfarin Indication: atrial fibrillation  No Known Allergies  Patient Measurements: Height: 5\' 7"  (170.2 cm) Weight: 80.8 kg (178 lb 2.1 oz) IBW/kg (Calculated) : 66.1  Vital Signs: Temp: 99 F (37.2 C) (09/05 1330) Temp Source: Oral (09/05 1330) BP: 169/60 (09/05 1530) Pulse Rate: 59 (09/05 1530)  Labs: Recent Labs    06/06/21 0516 06/06/21 1327 06/07/21 0500 06/08/21 0816  HGB  --  8.7*  --  8.5*  HCT  --  27.5*  --  26.4*  PLT  --  249  --  226  LABPROT 22.4*  --  21.1* 19.9*  INR 2.0*  --  1.8* 1.7*  CREATININE  --  4.80*  --   --      Estimated Creatinine Clearance: 12.3 mL/min (A) (by C-G formula based on SCr of 4.8 mg/dL (H)).   Assessment: 80yo male on warfarin PTA for Afib.  Admit INR 2.7 was therapeutic on PTA regimen of warfarin 5mg  Tue and Fri, 2.5 mg all other days (recently changed 8/18).  Given hematuria present, warfarin was held on 9/4.  INR subtherapeutic this morning at 1.7, CBC remains stable despite hematuria.  Will resume warfarin today per MD.   Goal of Therapy:  INR 2-3 Monitor platelets by anticoagulation protocol: Yes   Plan:  Give warfarin 2.5 mg PO x 1 today.   Daily PT/INR. Monitor CBC and for signs/symptoms of bleeding.   Vance Peper, PharmD PGY1 Pharmacy Resident Phone [phone] 06/08/2021 4:19 PM   Please check AMION for all [place] phone numbers After 10:00 PM, call [place] [phone]

## 2021-06-08 NOTE — Progress Notes (Signed)
Pt refused labs this morning. Nurse asked lab to come back later and try again. Michela Pitcher will come back around 8.

## 2021-06-08 NOTE — Progress Notes (Signed)
Physical Therapy Session Note  Patient Details  Name: [name] [name] MRN: [id number] Date of Birth: [birth year]  Today's Date: 06/08/2021 PT Individual Time: 0845-1000 PT Individual Time Calculation (min): 75 min   Short Term Goals: Week 1:  PT Short Term Goal 1 (Week 1): STG=LTG due to ELOS  Skilled Therapeutic Interventions/Progress Updates:  Pt resting in bed. Pt is on 3L/min; o2 sats 97% and HR 60 throughout session.  Pt had bladder scan and chest Xray during session given c/o of hematuria and SOB.  Initially he declined any tx, due to feeling "sick all over", but eventually he agreed to bedside tx.  He rated pain lower abdomen " a little bit".  Pt had hematuria this AM; Dr. Adam Phenix is aware.  Therapeutic exercise performed with LEs and trunk to increase strength for functional mobility supine- 20 x 1 alternating ankle pumps, bil adductor squeezes, bil hip internal rotation,  bil lower trunk rotation, 10 x 2 R/L short arc quad knee extension, R/L scapular protraction with extended elbow, bil bridging.  Rolling L><R using rail, modified independent.  Pt agrees to get OOB.  Supine>< sit with supervision.  Sit> stand with supervision, to Rw.  Gait training 5' forward, 5' backwards, 5' L><R with RW, close supervision.  No c/o dizziness. HR and O2 sats unchanged.  PT discussed O2 use with pt.  He and wife reported that he used 2.5 L/min at home each night, none during day.  Given current sats, PT recommended decreasing O2 but pt declined, stating that he felt SOB throughout session.   At end of session, pt resting in bed with needs at hand and bed alarm set.  Therapy Documentation Precautions:  Precautions Precautions: Fall Restrictions Weight Bearing Restrictions: No       Therapy/Group: Individual Therapy  Fusako Tanabe 06/08/2021, 10:14 AM

## 2021-06-08 NOTE — Progress Notes (Addendum)
[name] NEPHROLOGY PROGRESS NOTE  Assessment/ Plan:  # AKI/CKD stage IV, oliguric - presumably due to ischemic ATN due to volume depletion and concomitant use of ARB, aldactone, and jardiance.  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  Started on HD 05/28/21 with temp HD catheter. S/p TDC placement 06/02/21 w/ IR. Continue on TTS schedule for now and follow for renal recovery. No signs of renal recovery, will need outpatient HD for AKI.  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 Will do bladder scan.  May need to reconsult urology if hematuria persist.  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  No ESA due to pelvic mass.  #A fib - on coumadin and carvedilol.  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    #COPD: Some shortness of breath today.  Checking chest x-ray.  No lower extremity edema and volume looks acceptable.  Addendum: Chest x-ray result reviewed.  Consistent with volume overload/pleural effusion.  Given shortness of breath, we will plan to do dialysis today.  Discussed with the rehab MD and dialysis staff.  Subjective: Seen and examined at bedside.  He reports feeling weak and has episode of hematuria.  Some abdomen discomfort and shortness of breath.  No nausea, vomiting, chest pain.  His wife at bedside. Objective Vital signs in last 24 hours: Vitals:   06/07/21 1931 06/08/21 0421 06/08/21 0445 06/08/21 0750  BP: (!) 152/49 (!) 148/50  (!) 159/54  Pulse: (!) 58 65  (!) 54  Resp: 18 18  18   Temp: 97.7 F (36.5 C) 98.3 F (36.8 C)    TempSrc: Oral Oral    SpO2: 99% 99%  100%  Weight:   77.2 kg   Height:       Weight change:   Intake/Output Summary (Last 24 hours) at 06/08/2021 0947 Last data filed at 06/08/2021 0300 Gross per 24 hour  Intake 920 ml  Output 600 ml  Net 320 ml        Labs: Basic Metabolic Panel: Recent Labs  Lab 06/02/21 0422 06/03/21 0331 06/04/21 0353 06/05/21 1608 06/06/21 1327  NA 131*   < > 134* 133* 132*  K 3.6   < > 3.8 3.8 3.9  CL 94*   < > 97* 98 97*  CO2 25   < > 24 27 27   GLUCOSE 85   < > 65* 98 104*  BUN 61*   < > 47* 27* 33*  CREATININE 7.17*   < > 5.97* 4.41* 4.80*  CALCIUM 8.3*   < > 7.9* 8.0* 8.2*  PHOS 4.8*  --   --  2.8 3.2   < > = values in this interval not displayed.   Liver Function Tests: Recent Labs  Lab 06/02/21 0422 06/05/21 1608 06/06/21 1327  ALBUMIN 2.2* 2.3* 2.4*   No results for input(s): LIPASE, AMYLASE in the last 168 hours. No results for input(s): AMMONIA in the last 168 hours. CBC: Recent Labs  Lab 06/02/21 0422 06/03/21 0331 06/04/21 0353 06/05/21 1608 06/06/21 1327 06/08/21 0816  WBC 10.8* 9.3 8.8 9.0 10.4 11.7*  NEUTROABS 8.1* 6.9 6.5  --   --   --   HGB 8.2* 8.2* 8.1* 8.7* 8.7* 8.5*  HCT 25.4* 25.5* 25.2* 26.8* 27.5* 26.4*  MCV 94.8 96.6 96.6 95.7 97.2 96.4  PLT 256 213 241 237 249 226  Cardiac Enzymes: No results for input(s): CKTOTAL, CKMB, CKMBINDEX, TROPONINI in the last 168 hours. CBG: Recent Labs  Lab 06/05/21 1201 06/05/21 1651 06/05/21 2105 06/06/21 0550 06/06/21 1156  GLUCAP 87 94 89 83 88    Iron Studies: No results for input(s): IRON, TIBC, TRANSFERRIN, FERRITIN in the last 72 hours. Studies/Results: No results found.  Medications: Infusions:   Scheduled Medications:  amLODipine  10 mg Oral Daily   budesonide  0.25 mg Nebulization BID   carvedilol  3.125 mg Oral Daily   feeding supplement (NEPRO CARB STEADY)  237 mL Oral TID BM   ferrous sulfate  325 mg Oral Q breakfast   ipratropium-albuterol  3 mL Nebulization BID   levothyroxine  112 mcg Oral Q0600   multivitamin  1 tablet Oral QHS   pantoprazole  40 mg Oral BID   pravastatin  40 mg Oral q1800   Warfarin - Pharmacist Dosing Inpatient   Does not apply q1600    have reviewed scheduled  and prn medications.  Physical Exam: General:NAD, comfortable Heart:RRR, s1s2 nl Lungs: Coarse breath sound bilateral, no wheeze Abdomen:soft, Non-tender, non-distended Extremities:No edema Dialysis Access: IJ TDC in place  [name] [name] [name] [name] 06/08/2021,9:47 AM  LOS: 3 days

## 2021-06-08 NOTE — Progress Notes (Signed)
Physical Therapy Session Note  Patient Details  Name: [name] [name] MRN: [id number] Date of Birth: 09/15/1941  Today's Date: 06/08/2021 PT Individual Time: 7619-5093 PT Individual Time Calculation (min): 26 min   Today's Date: 06/08/2021 PT Missed Time: 34 Minutes Missed Time Reason: Unavailable (Comment) (dialysis)  Short Term Goals: Week 1:  PT Short Term Goal 1 (Week 1): STG=LTG due to ELOS  Skilled Therapeutic Interventions/Progress Updates:   Received pt supine in bed with wife present at bedside. Pt reported feeling "just too sick" to participate in therapy and stated that he would be leaving soon for dialysis and didn't want to get up. Suggested bed level strengthening exercises however pt reported doing them with PT this morning and did not feel up to doing them again. Pt on 2.5 L O2 with O2 sat 96% and HR 60bpm although reported feeling SOB. Pt ultimately agreeable to therapist getting together exercise program to work on when feeling up to it as pt reports being very active prior to admission. Provided pt with HEP consisting of the following exercises, however, upon therapist's return pt off unit for HD; handed printout to pt's wife.  -Small Range Straight Leg Raise - 1 x daily - 7 x weekly - 2 sets - 10 reps -Supine Bridge - 1 x daily - 7 x weekly - 2 sets - 8 reps -Supine Hip Abduction - 1 x daily - 7 x weekly - 2 sets - 10 reps -Supine March - 1 x daily - 7 x weekly - 2 sets - 10 reps 34 minutes missed of skilled physical therapy due to pt off unit for dialysis.   Therapy Documentation Precautions:  Precautions Precautions: Fall Restrictions Weight Bearing Restrictions: No  Therapy/Group: Individual Therapy [name] [name] PT, DPT   06/08/2021, 7:34 AM

## 2021-06-08 NOTE — Care Management (Signed)
Inpatient Rehabilitation Center Individual Statement of Services  Patient Name:  [name] [name]  Date:  06/08/2021  Welcome to the [facility].  Our goal is to provide you with an individualized program based on your diagnosis and situation, designed to meet your specific needs.  With this comprehensive rehabilitation program, you will be expected to participate in at least 3 hours of rehabilitation therapies Monday-Friday, with modified therapy programming on the weekends.  Your rehabilitation program will include the following services:  Physical Therapy (PT), Occupational Therapy (OT), 24 hour per day rehabilitation nursing, Therapeutic Recreaction (TR), Psychology, Neuropsychology, Care Coordinator, Rehabilitation Medicine, [place], and Other  Weekly team conferences will be held on Tuesdays to discuss your progress.  Your Inpatient Rehabilitation Care Coordinator will talk with you frequently to get your input and to update you on team discussions.  Team conferences with you and your family in attendance may also be held.  Expected length of stay: 6-9 days  Overall anticipated outcome: Supervision  Depending on your progress and recovery, your program may change. Your Inpatient Rehabilitation Care Coordinator will coordinate services and will keep you informed of any changes. Your Inpatient Rehabilitation Care Coordinator's name and contact numbers are listed  below.  The following services may also be recommended but are not provided by the [facility] will be made to provide these services after discharge if needed.  Arrangements include referral to agencies that provide these services.  Your insurance has been verified to be:  [facility] Medicare  Your primary doctor is:  Claretta Fraise  Pertinent information will be shared with your doctor and your insurance company.  Inpatient Rehabilitation Care Coordinator:  Ovidio Kin, [place] or ([phone]  Information discussed with and copy given to patient by: Rana Snare, 06/08/2021, 9:08 AM

## 2021-06-08 NOTE — Progress Notes (Signed)
[name] Arn, MD   Physician  Physical Medicine and Rehabilitation  PMR Pre-admission     Addendum  Date of Service:  06/05/2021  1:50 PM       Related encounter: ED to Hosp-Admission (Discharged) from 05/22/2021 in [facility] [address]       Show:Clear all [x]Written[x]Templated[x]Copied  Added by: [x]Boyette, Vertis Kelch, RN[x]Patel, Domenick Bookbinder, MD  []Hover for details                                                                                                                                                                                                                                                                                                                                                                PMR Admission Coordinator Pre-Admission Assessment   Patient: [name] [name] is an 80 y.o., male MRN: [id number] DOB: Aug 09, 1941 Height: 5' 9" (175.3 cm) Weight: 81.6 kg   Insurance Information HMO: yes    PPO:      PCP:      IPA:      80/20:      OTHER:  PRIMARY: [employer]      Policy#: [id number]      Subscriber: pt CM Name: via expedited appeal department for [employer]/ Initially  denied by [facility] and ap[roved on appeal  Phone#: [phone]     Fax#: [phone] appeals department.  Pre-Cert#: [id number] approved for 7 days. F/u with [employer] phone [phone] and fax [phone]      Employer:  Benefits:  Phone #: [phone]     Name: 9/2 Eff. Date: 10/04/2020     Deduct: none      Out of Pocket Max: $3600      Life Max: none CIR: $295 co pay per day days  1 until 5      SNF: no copay days 1 until 20; $188 co pay per day days 21 until 40; no copy days 41 until 100 Outpatient: $30 copay per visit     Co-Pay: visits per medical neccesity Home Health: 10%      Co-Pay: visits per medical neccesity DME: 80%     Co-Pay:  20% Providers: in network  SECONDARY: none        Financial Counselor:       Phone#:    The Engineer, petroleum" for patients in Inpatient Rehabilitation Facilities with attached "Privacy Act [place] Records" was provided and verbally reviewed with: Patient and Family   Emergency Contact Information Contact Information       Name Relation Home Work Mobile    [name] Spouse [phone]   [phone]    [name],[name] Daughter [phone]   [phone]           Current Medical History  Patient Admitting Diagnosis: Debility   History of Present Illness: [name] [name] is an 80 year old right-handed male with history of CAD/CABG, atrial fibrillation maintained on Coumadin, diastolic congestive heart failure, AICD, COPD with remote tobacco abuse with supplemental oxygen at night, CKD stage IV.   Presented 05/22/2021 with noted recent UTI with progressive weakness and fatigue.  Denied any chest pain or shortness of breath.  In the ED blood pressure 181/59, BUN 91, creatinine 10.49 from a recent baseline 2.52, hemoglobin 7.6.  Renal ultrasound showed some left renal atrophy.  Lower pole right renal cyst or minimally complex cyst.  No hydronephrosis.  Renal service consulted for AKI on CKD with hemodialysis initiated currently on a Tuesday Thursday Saturday schedule and latest creatinine 5.97.  IR consulted for [facility] placement.  His chronic Coumadin has been resumed.    Patient's medical record from [facility]  has been reviewed by the rehabilitation admission coordinator and physician.   Past Medical History      Past Medical History:  Diagnosis Date   AICD (automatic cardioverter/defibrillator) present 04/05/2017   Anemia     Arthritis      "hips; back" (07/09/2014)   Asthma     Atrial fibrillation (HCC)     CAD (coronary artery disease)     CHF (congestive heart failure) ([place])     Cholecystitis 11/2013   CKD (chronic kidney disease) 2015     Stage  3.    COPD (chronic obstructive pulmonary disease) (HCC)      on home oxygen, 2 liters at night10/2015   Esophageal reflux     Gallstones     Gastric antral vascular ectasia 2013   Gout     Heme positive stool     Hepatomegaly     Hiatal hernia     History of blood transfusion ~ 2012    "blood count dropped; had to get 3 units"   Hyperlipidemia     Hypothyroidism     Other and unspecified coagulation defects     Personal history of colonic polyps 05/29/2010    TUBULAR ADENOMA   Unspecified essential hypertension        Has the patient had major surgery during 100 days prior to admission? Yes   Family History   family history includes Early death in his brother; Heart attack in his brother; Heart disease in his brother and father; Hypertension in an other family member; Kidney disease in his mother; Leukemia in his mother; Stomach cancer in his sister  and sister.   Current Medications   Current Facility-Administered Medications:    0.9 %  sodium chloride infusion (Manually program via Guardrails IV Fluids), , Intravenous, Once, Opyd, Ilene Qua, MD   acetaminophen (TYLENOL) tablet 650 mg, 650 mg, Oral, Q6H PRN, Amin, Ankit Chirag, MD, 650 mg at 06/05/21 0615   albuterol (PROVENTIL) (2.5 MG/3ML) 0.083% nebulizer solution 2.5 mg, 2.5 mg, Nebulization, Q6H PRN, Amin, Ankit Chirag, MD, 2.5 mg at 06/05/21 0412   amLODipine (NORVASC) tablet 10 mg, 10 mg, Oral, Daily, Dahal, Binaya, MD   budesonide (PULMICORT) nebulizer solution 0.25 mg, 0.25 mg, Nebulization, BID, Amin, Ankit Chirag, MD, 0.25 mg at 06/05/21 0925   carvedilol (COREG) tablet 3.125 mg, 3.125 mg, Oral, BID WC, Dahal, Binaya, MD, 3.125 mg at 06/05/21 0825   Chlorhexidine Gluconate Cloth 2 % PADS 6 each, 6 each, Topical, Daily, Amin, Ankit Chirag, MD, 6 each at 06/04/21 1038   dextromethorphan-guaiFENesin ([place]man DM) 30-600 MG per 12 hr tablet 1 tablet, 1 tablet, Oral, BID PRN, Amin, Ankit Chirag, MD   feeding  supplement (NEPRO CARB STEADY) liquid 237 mL, 237 mL, Oral, TID BM, Dahal, Binaya, MD, 237 mL at 06/04/21 2125   ferrous sulfate tablet 325 mg, 325 mg, Oral, Q breakfast, Adefeso, Oladapo, DO, 325 mg at 06/05/21 0825   insulin aspart (novoLOG) injection 0-5 Units, 0-5 Units, Subcutaneous, QHS, Adefeso, Oladapo, DO   insulin aspart (novoLOG) injection 0-6 Units, 0-6 Units, Subcutaneous, TID WC, Adefeso, Oladapo, DO   ipratropium-albuterol (DUONEB) 0.5-2.5 (3) MG/3ML nebulizer solution 3 mL, 3 mL, Nebulization, BID, Dahal, Binaya, MD, 3 mL at 06/05/21 0926   levothyroxine (SYNTHROID) tablet 112 mcg, 112 mcg, Oral, Q0600, Amin, Ankit Chirag, MD, 112 mcg at 06/05/21 0610   multivitamin with minerals tablet 1 tablet, 1 tablet, Oral, Daily, Shawna Clamp, MD, 1 tablet at 06/05/21 0825   ondansetron (ZOFRAN) injection 4 mg, 4 mg, Intravenous, Q6H PRN, Dahal, Binaya, MD   pantoprazole (PROTONIX) EC tablet 40 mg, 40 mg, Oral, BID, Adefeso, Oladapo, DO, 40 mg at 06/05/21 0825   pravastatin (PRAVACHOL) tablet 40 mg, 40 mg, Oral, q1800, Adefeso, Oladapo, DO, 40 mg at 06/04/21 1800   senna-docusate (Senokot-S) tablet 1 tablet, 1 tablet, Oral, QHS PRN, Amin, Ankit Chirag, MD, 1 tablet at 06/05/21 0615   sodium chloride flush (NS) 0.9 % injection 10-40 mL, 10-40 mL, Intracatheter, PRN, Dahal, Binaya, MD, 10 mL at 05/28/21 2142   traZODone (DESYREL) tablet 50 mg, 50 mg, Oral, QHS PRN, Amin, Ankit Chirag, MD, 50 mg at 06/02/21 2108   warfarin (COUMADIN) tablet 2.5 mg, 2.5 mg, Oral, ONCE-1600, Bertis Ruddy, [facility]   Warfarin - Pharmacist Dosing Inpatient, , Does not apply, q1600, Mignon Pine, RPH, Given at 06/04/21 1657   Patients Current Diet:  Diet Order                  Diet renal with fluid restriction Fluid restriction: 1200 mL Fluid; Room service appropriate? Yes; Fluid consistency: Thin  Diet effective now                         Precautions / Restrictions Precautions Precautions:  Fall Restrictions Weight Bearing Restrictions: No    Has the patient had 2 or more falls or a fall with injury in the past year? No   Prior Activity Level Community (5-7x/wk): Independent , active and driving   Prior Functional Level Self Care: Did the patient need help bathing, dressing, using  the toilet or eating? Independent   Indoor Mobility: Did the patient need assistance with walking from room to room (with or without device)? Independent   Stairs: Did the patient need assistance with internal or external stairs (with or without device)? Independent   Functional Cognition: Did the patient need help planning regular tasks such as shopping or remembering to take medications? Independent   Patient Information Are you of Hispanic, Latino/a,or Spanish origin?: A. No, not of Hispanic, Latino/a, or Spanish origin What is your race?: A. White Do you need or want an interpreter to communicate with a doctor or health care staff?: 0. No   Patient's Response To:  Health Literacy and Transportation Is the patient able to respond to health literacy and transportation needs?: Yes Health Literacy - How often do you need to have someone help you when you read instructions, pamphlets, or other written material from your doctor or pharmacy?: Never In the past 12 months, has lack of transportation kept you from medical appointments or from getting medications?: No In the past 12 months, has lack of transportation kept you from meetings, work, or from getting things needed for daily living?: No   Home Assistive Devices / [place] Devices/Equipment: Environmental consultant (specify type) (4 wheel walker) Home Equipment: Walker - 2 wheels, Other (comment), Shower seat, Cane - single point, Hand held shower head   Prior Device Use: Indicate devices/aids used by the patient prior to current illness, exacerbation or injury? None of the above   Current Functional Level Cognition   Overall Cognitive  Status: Within Functional Limits for tasks assessed Orientation Level: Oriented X4    Extremity Assessment (includes Sensation/Coordination)   Upper Extremity Assessment: Defer to OT evaluation  Lower Extremity Assessment: Defer to PT evaluation     ADLs   Overall ADL's : Needs assistance/impaired Eating/Feeding: Set up, Sitting Grooming: Set up, Bed level Upper Body Bathing: Minimal assistance, Sitting Lower Body Bathing: Moderate assistance, Sit to/from stand Upper Body Dressing : Minimal assistance, Sitting Lower Body Dressing: Moderate assistance, Sitting/lateral leans Lower Body Dressing Details (indicate cue type and reason): Doffed and donned non-skid socks from eob assistance to thread over feet Toilet Transfer: Minimal assistance (simulated in room) Toilet Transfer Details (indicate cue type and reason): Min A for power up and maintaining balance Functional mobility during ADLs: Minimal assistance (side steps towards HOB) General ADL Comments: Pt presenting with decreased actiivty tolerance and balance. Fatigues quickly. Reports he didnt sleep well due to pain. Benefits from increased encouragement     Mobility   Overal bed mobility: Needs Assistance Bed Mobility: Supine to Sit Supine to sit: Supervision, HOB elevated Sit to supine: Min assist General bed mobility comments: Difficulty scooting to eob and difficulty getting feet back into bed     Transfers   Overall transfer level: Needs assistance Equipment used: Rolling walker (2 wheeled) Transfers: Sit to/from Stand Sit to Stand: Min guard Stand pivot transfers: Mod assist, +2 safety/equipment General transfer comment: performed standing from eob with min assist to stand after 3 attempts on his own     Ambulation / Gait / Stairs / Wheelchair Mobility   Ambulation/Gait Ambulation/Gait assistance: Counsellor (Feet): 80 Feet Assistive device: Rolling walker (2 wheeled) Gait Pattern/deviations:  Step-through pattern General Gait Details: pt with slowed step-through gait, reduced gait speed Gait velocity: reduced Gait velocity interpretation: <1.8 ft/sec, indicate of risk for recurrent falls     Posture / Balance Balance Overall balance assessment: Needs assistance Sitting-balance  support: No upper extremity supported, Feet supported Sitting balance-Leahy Scale: Good Standing balance support: Bilateral upper extremity supported Standing balance-Leahy Scale: Poor Standing balance comment: reliant on UE support of RW     Special needs/care consideration New to hemodialysis this admission    Previous Home Environment  Living Arrangements: Spouse/significant other  Lives With: Spouse Available Help at Discharge: Family, Available 24 hours/day Type of Home: House Home Layout: One level Home Access: Stairs to enter Entrance Stairs-Rails: Left Entrance Stairs-Number of Steps: 3 Bathroom Shower/Tub: Tub/shower unit, Multimedia programmer: Standard Bathroom Accessibility: Yes How Accessible: Accessible via walker Home Care Services: No Additional Comments: Supplemental O2 at night   Discharge Living Setting Plans for Discharge Living Setting: Patient's home, Lives with (comment) (wife) Type of Home at Discharge: House Discharge Home Layout: One level Discharge Home Access: Stairs to enter Entrance Stairs-Rails: Left Entrance Stairs-Number of Steps: 3 Discharge Bathroom Shower/Tub: Tub/shower unit Discharge Bathroom Toilet: Standard Discharge Bathroom Accessibility: Yes How Accessible: Accessible via walker Does the patient have any problems obtaining your medications?: No   Social/Family/Support Systems Patient Roles: Spouse Contact Information: wife, [name] Anticipated Caregiver: wife Anticipated Ambulance person Information: see above Ability/Limitations of Caregiver: none Caregiver Availability: 24/7 Discharge Plan Discussed with Primary Caregiver:  Yes Is Caregiver In Agreement with Plan?: Yes Does Caregiver/Family have Issues with Lodging/Transportation while Pt is in Rehab?: No   Goals Patient/Family Goal for Rehab: Mod I to supervision with PT and OT Expected length of stay: ELOS 5 to 7 days Pt/Family Agrees to Admission and willing to participate: Yes Program Orientation Provided & Reviewed with Pt/Caregiver Including Roles  & Responsibilities: Yes   Decrease burden of Care through IP rehab admission: n/a   Possible need for SNF placement upon discharge: not anticipated   Patient Condition: I have reviewed medical records from [facility] , spoken with CM, and patient and spouse. I met with patient at the bedside for inpatient rehabilitation assessment.  Patient will benefit from ongoing PT and OT, can actively participate in 3 hours of therapy a day 5 days of the week, and can make measurable gains during the admission.  Patient will also benefit from the coordinated team approach during an Inpatient Acute Rehabilitation admission.  The patient will receive intensive therapy as well as Rehabilitation physician, nursing, social worker, and care management interventions.  Due to bladder management, bowel management, safety, skin/wound care, disease management, medication administration, pain management, and patient education the patient requires 24 hour a day rehabilitation nursing.  The patient is currently min assist overall with mobility and basic ADLs.  Discharge setting and therapy post discharge at home with home health is anticipated.  Patient has agreed to participate in the Acute Inpatient Rehabilitation Program and will admit today.   Preadmission Screen Completed By:  Cleatrice Burke, 06/05/2021 1:50 PM ______________________________________________________________________   Discussed status with Dr. Posey Pronto  on 06/05/2021 at 1411 and received approval for admission today.   Admission Coordinator:  Cleatrice Burke, RN, time  2707 Date  06/05/2021    Assessment/Plan: Diagnosis: Debility Does the need for close, 24 hr/day Medical supervision in concert with the patient's rehab needs make it unreasonable for this patient to be served in a less intensive setting? Yes Co-Morbidities requiring supervision/potential complications: CAD/CABG, atrial fibrillation maintained on Coumadin, diastolic congestive heart failure, AICD, COPD with remote tobacco abuse with supplemental oxygen at night, CKD stage IV Due to safety, skin/wound care, disease management, pain management, and patient education,  does the patient require 24 hr/day rehab nursing? Yes Does the patient require coordinated care of a physician, rehab nurse, PT, OT to address physical and functional deficits in the context of the above medical diagnosis(es)? Yes Addressing deficits in the following areas: balance, endurance, locomotion, strength, transferring, bathing, dressing, toileting, and psychosocial support Can the patient actively participate in an intensive therapy program of at least 3 hrs of therapy 5 days a week? Yes The potential for patient to make measurable gains while on inpatient rehab is excellent Anticipated functional outcomes upon discharge from inpatient rehab: modified independent PT, modified independent OT, n/a SLP Estimated rehab length of stay to reach the above functional goals is: 4-6 days. Anticipated discharge destination: Home 10. Overall Rehab/Functional Prognosis: excellent     MD Signature: Delice Lesch, MD, ABPMR     Revision History                               Note Details  Author [name] Arn, MD File Time 06/05/2021  3:43 PM  Author Type Physician Status Addendum  Last Editor [name] Arn, MD Service Physical Medicine and [place] # [id number] [place] Date 06/05/2021

## 2021-06-08 NOTE — Progress Notes (Signed)
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    COPD (chronic obstructive pulmonary disease) (HCC)    on home oxygen, 2 liters at night10/2015   Esophageal reflux    Gallstones    Gastric antral vascular ectasia 2013   Gout    Heme positive stool    Hepatomegaly    Hiatal hernia    History of blood transfusion ~ 2012   "blood count dropped; had to get 3 units"   Hyperlipidemia    Hypothyroidism    Other and unspecified coagulation defects    Personal history of colonic polyps 05/29/2010   TUBULAR ADENOMA   Unspecified essential hypertension    Past Surgical History:  Past Surgical History:  Procedure Laterality Date   CARDIAC CATHETERIZATION N/A 07/15/2016   Procedure: Right/Left Heart Cath and Coronary/Graft Angiography;  Surgeon: Belva Crome, MD;  Location: [facility];  Service: Cardiovascular;  Laterality: N/A;   cholecystomy tube  12/28/2014 - 01/06/2015   tube clogged with debris, removed and IR unable to place new tube.    COLONOSCOPY  2013   Dr. Sharlett Iles: no polyps or evidence of active bleeding   CORONARY ANGIOPLASTY WITH STENT PLACEMENT  ~ 2008; 07/08/2014   "1 + 3"   CORONARY ARTERY BYPASS GRAFT  1998   CABG X4   ESOPHAGOGASTRODUODENOSCOPY  2013   Dr. 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 Sharlett Iles: AVM at 48 and blood at 30 minutes beyond first duodenal image but not actual lesion seen. If persistent IDA, bleeding, recommend enteroscopy with ablation    HERNIA REPAIR     ICD IMPLANT N/A 04/05/2017   Procedure: ICD Implant;  Surgeon: Evans Lance, MD;  Location: [facility];  Service: Cardiovascular;  Laterality: N/A;   IR FLUORO GUIDE CV LINE RIGHT  05/27/2021   IR FLUORO GUIDE CV LINE RIGHT  06/02/2021   IR US GUIDE VASC ACCESS RIGHT  05/27/2021   LEFT AND RIGHT HEART CATHETERIZATION WITH CORONARY/GRAFT ANGIOGRAM N/A 07/09/2014   Right and left heart cath, bare metal stent to SVG to RCA. Sinclair Grooms, MD;    UMBILICAL HERNIA REPAIR  [phone]   Social History:  reports that he quit smoking about 24 years ago. His smoking use included cigarettes. He has a 42.00 pack-year smoking history. He has never used smokeless tobacco. He reports current alcohol use of about 1.0 standard drink per week. He reports that he does not use drugs.  Family / Support Systems Marital Status: Married How Long?: 39 years Patient Roles: Partner, Spouse Spouse/Significant Other: [name] (wife) Children: 2 adut children: Abigail Butts and Legrand Como- both live in [place] atleast 10-12 mi from home. Other Supports: grandchildren  PRN (live in [place]) Anticipated Caregiver: Wife Ability/Limitations of Caregiver: None reported Caregiver Availability: 24/7 Family Dynamics: Pt lives with his wife.  Social History Preferred language: English Religion: Baptist Cultural Background: Pt worked at [employer] for 37 yrs until retirement in 2000 Education: [place] - How often do you need to have someone help you when you read instructions, pamphlets, or other written material from your doctor or pharmacy?: Never Writes: Yes Employment  Status: Retired Date Retired/Disabled/Unemployed: 2000 Age Retired: 57 Public relations account executive Issues: Denies Guardian/Conservator: N.A   Abuse/Neglect Abuse/Neglect Assessment Can Be Completed: Yes Physical Abuse: Denies Verbal Abuse: Denies Sexual Abuse: Denies Exploitation of patient/patient's resources: Denies Self-Neglect: Denies  Patient response to: Social Isolation - How often do you feel lonely or isolated from those around you?: Never  Emotional Status Pt's affect, behavior and adjustment status: Pt in good spirits at time of visit Recent Psychosocial Issues: Denies Psychiatric History: Denies Substance Abuse History: Pt reports he quit smoking cigarettes in 1998 after his heart attack (had been smoking for 34 yrs). Denies etoh and rec use.  Patient / Family Perceptions, Expectations & Goals Pt/Family understanding of illness & functional limitations: Pt and family have a general understanding of care needs Premorbid pt/family roles/activities: Independent Anticipated changes in roles/activities/participation: Assistance with ADLs/IADLs Pt/family expectations/goals: Pt goal is gettign well. Pt wife's gola is to get him well enough to go home and move on his own more.  Community Resources Express Scripts: None Premorbid Home Care/DME Agencies: None Transportation available at discharge: Wife Is the patient able to respond to transportation needs?: Yes In the past 12 months, has lack of transportation kept you from medical appointments or from getting medications?: No In the past 12 months, has lack of transportation kept you from meetings, work, or from getting things needed for daily living?: No  Discharge Planning Living Arrangements: Spouse/significant other Support Systems: Spouse/significant other, Children, Other relatives Type of Residence: Private residence Insurance Resources: Multimedia programmer (specify) (UHC Medicare and AARP-Rx) Financial  Resources: Social Security Financial Screen Referred: No Living Expenses: Own Money Management: Spouse Does the patient have any problems obtaining your medications?: No Home Management: Wife manages all homecare needs Patient/Family Preliminary Plans: No changes Care Coordinator Barriers to Discharge: Decreased caregiver support, Lack of/limited family support Care Coordinator Anticipated Follow Up Needs: HH/OP Expected length of stay: 6-9 days.  Clinical Impression This SW covering for primary SW Becky Dupree.   SW introduced self, explained role, and discussed discharge process. Pt is not a English as a second language teacher. No HCPOA. DME: RW (wife's), canes, w/c and 3in1 bSC, and has walk in shower with built in seat.  Home o2 with [employer] that is typically used at night but has been using in the day at times. Using 2L [place] o2 while here in hospital as well. Aware their primary SW will f/u.  Pinki Rottman A Shizuko Wojdyla 06/08/2021, 12:45 PM

## 2021-06-08 NOTE — Progress Notes (Signed)
Occupational Therapy Session Note  Patient Details  Name: DESMOND SZABO MRN: [id number] Date of Birth: 1940/11/11  Today's Date: 06/08/2021 OT Missed Time: 19 Minutes Missed Time Reason: Other (comment) (off unit, in dialysis)   Short Term Goals: Week 1:  OT Short Term Goal 1 (Week 1): STGs = LTGs   Skilled Therapeutic Interventions/Progress Updates:    OT presented to patient room at time of session with pt not in room, family member present and stating that pt is out of room in dialysis. Pt missed 45 mins of OT d/t dialysis.   Therapy Documentation Precautions:  Precautions Precautions: Fall Restrictions Weight Bearing Restrictions: No    Therapy/Group: Individual Therapy  Viona Gilmore 06/08/2021, 12:57 PM

## 2021-06-09 DIAGNOSIS — R5381 Other malaise: Secondary | ICD-10-CM | POA: Diagnosis not present

## 2021-06-09 LAB — CBC
HCT: 25.8 % — ABNORMAL LOW (ref 39.0–52.0)
Hemoglobin: 8.2 g/dL — ABNORMAL LOW (ref 13.0–17.0)
MCH: 30.9 pg (ref 26.0–34.0)
MCHC: 31.8 g/dL (ref 30.0–36.0)
MCV: 97.4 fL (ref 80.0–100.0)
Platelets: 215 10*3/uL (ref 150–400)
RBC: 2.65 MIL/uL — ABNORMAL LOW (ref 4.22–5.81)
RDW: 13.9 % (ref 11.5–15.5)
WBC: 7.9 10*3/uL (ref 4.0–10.5)
nRBC: 0 % (ref 0.0–0.2)

## 2021-06-09 LAB — RENAL FUNCTION PANEL
Albumin: 2.4 g/dL — ABNORMAL LOW (ref 3.5–5.0)
Anion gap: 5 (ref 5–15)
BUN: 13 mg/dL (ref 8–23)
CO2: 29 mmol/L (ref 22–32)
Calcium: 8 mg/dL — ABNORMAL LOW (ref 8.9–10.3)
Chloride: 98 mmol/L (ref 98–111)
Creatinine, Ser: 2.79 mg/dL — ABNORMAL HIGH (ref 0.61–1.24)
GFR, Estimated: 22 mL/min — ABNORMAL LOW (ref >60)
Glucose, Bld: 118 mg/dL — ABNORMAL HIGH (ref 70–99)
Phosphorus: 1.6 mg/dL — ABNORMAL LOW (ref 2.5–4.6)
Potassium: 3.5 mmol/L (ref 3.5–5.1)
Sodium: 132 mmol/L — ABNORMAL LOW (ref 135–145)

## 2021-06-09 LAB — PROTIME-INR
INR: 2 — ABNORMAL HIGH (ref 0.8–1.2)
Prothrombin Time: 22.3 seconds — ABNORMAL HIGH (ref 11.4–15.2)

## 2021-06-09 MED ORDER — PENTAFLUOROPROP-TETRAFLUOROETH EX AERO: 1.0000 "application " | INHALATION_SPRAY | CUTANEOUS | Status: DC | PRN

## 2021-06-09 MED ORDER — SODIUM CHLORIDE 0.9 % IV SOLN: 100.0000 mL | INTRAVENOUS | Status: DC | PRN

## 2021-06-09 MED ORDER — HEPARIN SODIUM (PORCINE) 1000 UNIT/ML DIALYSIS
1000.0000 [IU] | INTRAMUSCULAR | Status: DC | PRN
  Filled 2021-06-09: qty 1

## 2021-06-09 MED ORDER — CHLORHEXIDINE GLUCONATE CLOTH 2 % EX PADS
6.0000 | MEDICATED_PAD | Freq: Every day | CUTANEOUS | Status: DC
  Administered 2021-06-09 – 2021-06-10 (×2): 6 via TOPICAL

## 2021-06-09 MED ORDER — ALTEPLASE 2 MG IJ SOLR: 2.0000 mg | Freq: Once | INTRAMUSCULAR | Status: DC | PRN

## 2021-06-09 MED ORDER — WARFARIN SODIUM 1 MG PO TABS
1.0000 mg | ORAL_TABLET | Freq: Every day | ORAL | Status: DC
  Administered 2021-06-09: 1 mg via ORAL
  Filled 2021-06-09: qty 1

## 2021-06-09 MED ORDER — LIDOCAINE-PRILOCAINE 2.5-2.5 % EX CREA: 1.0000 "application " | TOPICAL_CREAM | CUTANEOUS | Status: DC | PRN

## 2021-06-09 MED ORDER — LIDOCAINE HCL (PF) 1 % IJ SOLN: 5.0000 mL | INTRAMUSCULAR | Status: DC | PRN

## 2021-06-09 NOTE — Progress Notes (Signed)
Physical Therapy Session Note  Patient Details  Name: [name] [name] MRN: [id number] Date of Birth: April 23, 1941  Today's Date: 06/09/2021 PT Individual Time: 7017-7939 PT Individual Time Calculation (min): 43 min   Today's Date: 06/09/2021 PT Missed Time: 17 Minutes Missed Time Reason: Patient fatigue  Short Term Goals: Week 1:  PT Short Term Goal 1 (Week 1): STG=LTG due to ELOS  Skilled Therapeutic Interventions/Progress Updates:   Received pt supine in bed, pt agreeable to PT treatment, and denied any pain during session but reported "just feeling weak". Session with emphasis on functional mobility/transfers, generalized strengthening, ambulation, and improved activity tolerance. O2 sat 98% on 3L O2 at rest. Pt requested to "work his legs" and agreeable to go for a "short" walk but did not want to "overdo" it. Pt transferred semi-reclined<>sitting EOB mod I using bedrails and sit<>stand with RW and supervision. Pt ambulated 58ft with RW and supervision on RA. O2 sat 85% and only able to increase up to 88% on RA but increased to 95% when placed on 2.5L O2. Pt requested to return to bed and work on leg exercises; sit<>semi-reclined mod I. Pt performed the following exercises with supervision and verbal cues for technique: -SLR 2x15 bilaterally with 0.75lb ankle weight  -bridges 1x10 and 1x15 -supine march 2x15 on RLE and 1x20 and 1x25 on LLE with 0.75lb ankle weight  -hooklying hip abduction with grn TB 1x10 and 1x15 -hip abduction with 0.75lb ankle weight 2x15 bilaterally O2 sat >93% with exercises and pt required multiple rest breaks due to SOB and fatigue. Pt reported feeling exhausted and requested to "take a nap". Concluded session with pt semi-reclined in bed, needs within reach, and bed alarm on. Pt left on 2.5L O2 via [place] with O2 sat 95%. 17 minutes missed of skilled physical therapy due to fatigue.   Therapy Documentation Precautions:  Precautions Precautions:  Fall Restrictions Weight Bearing Restrictions: No  Therapy/Group: Individual Therapy Alfonse Alpers PT, DPT   06/09/2021, 7:14 AM

## 2021-06-09 NOTE — Progress Notes (Signed)
Occupational Therapy Session Note  Patient Details  Name: [name] [name] MRN: [id number] Date of Birth: 1941/01/11  Today's Date: 06/09/2021 OT Individual Time: 1031-1130 OT Individual Time Calculation (min): 59 min    Short Term Goals: Week 1:  OT Short Term Goal 1 (Week 1): STGs = LTGs  Skilled Therapeutic Interventions/Progress Updates:  Pt greeted supine in bed  agreeable to OT intervention. Session focus on  BADL reeducation. Pt completed bed mobility MOD I. Pt on 2L upon arrival, doffed O2 for session ( pt reports he only wears O2 at night) however pt reports SOB with SpO2 dropping to 93% reapplied O2 at 2L with an increase to 98%. Pt completed functional mobility with no AD with CGA to manage O2. Pt completed bathing at sink with overall s/u assist however pt only bathed LB. This OTA washed pts hair in sink as energy conservation strategy as pt reports fatigue.  Pt completed dressing with overall s/u assist from EOB. Pt required rest breaks in between bathing and dressing with pt reporting fatigue. Pt completed ambulatory toilet transfer with CGA ( CGA to manage lines) with pt completing 3/3 toileting tasks with  supervision. Pt left supine in bed with all needs within reach and bed alarm activated.                     Therapy Documentation Precautions:  Precautions Precautions: Fall Restrictions Weight Bearing Restrictions: No  Pain: Pt reports no pain during session.     Therapy/Group: Individual Therapy  Precious Haws 06/09/2021, 12:32 PM

## 2021-06-09 NOTE — Progress Notes (Signed)
PROGRESS NOTE   Subjective/Complaints: [name] [name] complains of some continues shortness of breath and hematuria- followed up with cardiology and nephrology who recommended continued coumadin. Ambulating well in hallway today!   ROS:  Pt denies abd pain, CP, N/V/C/D, and vision changes    Objective:   DG CHEST PORT 1 VIEW  Result Date: 06/08/2021 CLINICAL DATA:  Shortness of breath. EXAM: PORTABLE CHEST 1 VIEW COMPARISON:  Two-view chest x-ray 07/14/2020 FINDINGS: Heart is enlarged. Atherosclerotic changes are noted at the aortic arch. Tunneled right IJ dialysis catheter terminates at the superior cavoatrial junction. Bilateral pleural effusions are present, right greater than left. Bilateral interstitial and airspace disease noted, right greater than left. IMPRESSION: Cardiomegaly with bilateral pleural effusions and bilateral interstitial and airspace disease, right greater than left. Findings are consistent with congestive heart failure. Electronically Signed   By: San Morelle M.D.   On: 06/08/2021 10:22   Recent Labs    06/06/21 1327 06/08/21 0816  WBC 10.4 11.7*  HGB 8.7* 8.5*  HCT 27.5* 26.4*  PLT 249 226   Recent Labs    06/06/21 1327  NA 132*  K 3.9  CL 97*  CO2 27  GLUCOSE 104*  BUN 33*  CREATININE 4.80*  CALCIUM 8.2*    Intake/Output Summary (Last 24 hours) at 06/09/2021 1215 Last data filed at 06/09/2021 0700 Gross per 24 hour  Intake 118 ml  Output 2500 ml  Net -2382 ml        Physical Exam: Vital Signs Blood pressure (!) 157/51, pulse (!) 59, temperature 98.1 F (36.7 C), resp. rate 18, height 5\' 7"  (1.702 m), weight 78.3 kg, SpO2 100 %. Gen: no distress, normal appearing HEENT: oral mucosa pink and moist, NCAT Cardio: Reg rate Pulmonary:     Effort: Pulmonary effort is normal.     Breath sounds: Normal breath sounds.     Comments: + [place], +wheezing Abdominal:     General: Abdomen is  flat. There is no distension.     Comments: Bowel sounds hypoactive  Musculoskeletal:     Cervical back: Normal range of motion and neck supple.     Comments: No edema or tenderness in extremities  Skin:    General: Skin is warm and dry.  Neurological:     Mental Status: He is alert.     Comments: Alert and oriented Makes eye contact with examiner. Motor: Bilateral upper extremities: 4/5 proximal distal Right lower extremity: 4/5 proximal distal Left lower extremity: 4-/5 proximal distal  Psychiatric:        Mood and Affect: Mood normal.        Behavior: Behavior normal.  GU: Blood in urinal   Assessment/Plan: 1. Functional deficits which require 3+ hours per day of interdisciplinary therapy in a comprehensive inpatient rehab setting. Physiatrist is providing close team supervision and 24 hour management of active medical problems listed below. Physiatrist and rehab team continue to assess barriers to discharge/monitor patient progress toward functional and medical goals  Care Tool:  Bathing  Bathing activity did not occur: Refused           Bathing assist       Upper Body Dressing/Undressing  Upper body dressing   What is the patient wearing?: Hospital gown only    Upper body assist Assist Level: Minimal Assistance - Patient > 75%    Lower Body Dressing/Undressing Lower body dressing      What is the patient wearing?: Incontinence brief, Pants     Lower body assist Assist for lower body dressing: Moderate Assistance - Patient 50 - 74%     Toileting Toileting Toileting Activity did not occur Landscape architect and hygiene only): Refused  Toileting assist Assist for toileting: Minimal Assistance - Patient > 75% Assistive Device Comment: urinal   Transfers Chair/bed transfer  Transfers assist     Chair/bed transfer assist level: Minimal Assistance - Patient > 75%     Locomotion Ambulation   Ambulation assist      Assist level:  Supervision/Verbal cueing Assistive device: Walker-rolling Max distance: 29ft   Walk 10 feet activity   Assist     Assist level: Supervision/Verbal cueing Assistive device: Walker-rolling   Walk 50 feet activity   Assist Walk 50 feet with 2 turns activity did not occur: Safety/medical concerns  Assist level: Supervision/Verbal cueing Assistive device: Walker-rolling    Walk 150 feet activity   Assist Walk 150 feet activity did not occur: Safety/medical concerns         Walk 10 feet on uneven surface  activity   Assist Walk 10 feet on uneven surfaces activity did not occur: Safety/medical concerns         Wheelchair     Assist Is the patient using a wheelchair?: Yes      Wheelchair assist level: Supervision/Verbal cueing Max wheelchair distance: 50    Wheelchair 50 feet with 2 turns activity    Assist        Assist Level: Supervision/Verbal cueing   Wheelchair 150 feet activity     Assist      Assist Level: Moderate Assistance - Patient 50 - 74%   Blood pressure (!) 157/51, pulse (!) 59, temperature 98.1 F (36.7 C), resp. rate 18, height 5\' 7"  (1.702 m), weight 78.3 kg, SpO2 100 %.    Medical Problem List and Plan: 1.   Debility secondary to AKI on CKD/multi medical             -patient may not shower             -ELOS/Goals: 4-6 days/supervision/mod I            Continue CIR/PT and OT- as tolerated today- feels "really bad"  -Interdisciplinary Team Conference today   2.  Antithrombotics: -DVT/anticoagulation:  Pharmaceutical: Coumadin             -antiplatelet therapy: N/A 3. Pain Management: Tylenol as needed 4. Mood: Provide emotional support             -antipsychotic agents: N/A 5. Neuropsych: This patient is capable of making decisions on his own behalf. 6. Skin/Wound Care: Routine skin checks 7. Fluids/Electrolytes/Nutrition: Routine in and outs 8.  AKI on CKD.  Hemodialysis initiated.  Follow-up nephrology  services             Labs with HD  9/5- spoke with renal- will do HD today as well as scheduled T/H/S due to CHF exacerbation. .  9.  Acute on chronic anemia.  Patient did receive a total of 3 units packed red blood cells.  Continue iron supplement  9/5- Hb 8.5- stable- con't to monitor  CBC ordered 10.  History of COPD/asthma.  Chronically on 2.5-3 L oxygen nightly. Ordered incentive spirometer to improve breathing strength and ability over time. 11.  Diastolic congestive heart failure.  Monitor for any signs of fluid overload.  Currently on amlodipine, continue  Hydralazine Entresto and Aldactone currently on hold  9/5- having CHF exacerbation based on CXR results today- (ordered 2 view CXR)- having HD again today per renal. 12.  Atrial fibrillation.  Continue Coumadin.  Cardiac rate controlled. INR up to 2 on 9/6 13.  Hyperlipidemia.  Pravachol 14.  GERD.  Continue Protonix 15.  Hypothyroidism: Continue Synthroid 16. Overweight BMI 27.97: provide dietary education.  17. Bradycardia: discontinue Coreg 18. Hematuria  9/5- has pelvic mass- could be cause- has restarted as of last night- Hb stable- so will con't to monitor- and call Urology if gets worse- mainly clots- U/A and Cx ordered per nursing requirements due to clots.  9/6: appreciate renal and cardiology input- continue warfarin despite hematuria    LOS: 4 days A FACE TO FACE EVALUATION WAS PERFORMED  [name] [name] 06/09/2021, 12:15 PM

## 2021-06-09 NOTE — Progress Notes (Addendum)
[facility] NEPHROLOGY PROGRESS NOTE  Assessment/ Plan:  # AKI/CKD stage IV, oliguric - presumably due to ischemic ATN due to volume depletion and concomitant use of ARB, aldactone, and jardiance.  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  Started on HD 05/28/21 with temp HD catheter. S/p TDC placement 06/02/21 w/ IR. Continue on TTS schedule for now and follow for renal recovery. No signs of renal recovery, will need outpatient HD for AKI.  Discussed with renal navigator. Status post extra HD yesterday for pulmonary edema, had 2.5 L UF and feels better.  Plan for short HD today, UF as tolerated.  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  No improvement in hematuria, may need to stop Coumadin especially because of ongoing hematuria.  Discussed with the primary team who will discuss with the cardiologist.  May need to reconsult urologist while he is in the hospital.  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  No ESA due to pelvic mass.  #A fib - on coumadin and carvedilol.  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    #COPD: Continue bronchodilators.  Volume management as above.  Subjective: Seen and examined at bedside.  Had dialysis yesterday for shortness of breath.  Feels much better today.  Denies nausea vomiting chest pain.  Still having hematuria.  Objective Vital signs in last 24 hours: Vitals:   06/08/21 1936 06/08/21 2014 06/09/21 0454 06/09/21 0836  BP: (!) 160/47  (!) 157/51   Pulse: (!) 59  (!) 59   Resp: 18  18   Temp: 98.3 F (36.8 C)  98.1 F (36.7 C)   TempSrc:      SpO2: 99% 98% 97% 100%  Weight:      Height:       Weight change: 3.6 kg  Intake/Output Summary (Last 24 hours) at 06/09/2021 0921 Last data filed at 06/09/2021 0700 Gross per 24 hour  Intake 118 ml  Output 2500 ml  Net -2382 ml         Labs: Basic Metabolic Panel: Recent Labs  Lab 06/04/21 0353 06/05/21 1608 06/06/21 1327  NA 134* 133* 132*  K 3.8 3.8 3.9  CL 97* 98 97*  CO2 24 27 27   GLUCOSE 65* 98 104*  BUN 47* 27* 33*  CREATININE 5.97* 4.41* 4.80*  CALCIUM 7.9* 8.0* 8.2*  PHOS  --  2.8 3.2    Liver Function Tests: Recent Labs  Lab 06/05/21 1608 06/06/21 1327  ALBUMIN 2.3* 2.4*    No results for input(s): LIPASE, AMYLASE in the last 168 hours. No results for input(s): AMMONIA in the last 168 hours. CBC: Recent Labs  Lab 06/03/21 0331 06/04/21 0353 06/05/21 1608 06/06/21 1327 06/08/21 0816  WBC 9.3 8.8 9.0 10.4 11.7*  NEUTROABS 6.9 6.5  --   --   --   HGB 8.2* 8.1* 8.7* 8.7* 8.5*  HCT 25.5* 25.2* 26.8* 27.5* 26.4*  MCV 96.6 96.6 95.7 97.2 96.4  PLT 213 241 237 249 226    Cardiac Enzymes: No results for input(s): CKTOTAL, CKMB, CKMBINDEX, TROPONINI in the last 168 hours. CBG: Recent Labs  Lab 06/05/21 1201 06/05/21 1651 06/05/21 2105 06/06/21 0550 06/06/21 1156  GLUCAP 87 94 89 83 88     Iron Studies: No results for input(s): IRON, TIBC, TRANSFERRIN, FERRITIN in the last 72 hours. Studies/Results: DG CHEST PORT  1 VIEW  Result Date: 06/08/2021 CLINICAL DATA:  Shortness of breath. EXAM: PORTABLE CHEST 1 VIEW COMPARISON:  Two-view chest x-ray 07/14/2020 FINDINGS: Heart is enlarged. Atherosclerotic changes are noted at the aortic arch. Tunneled right IJ dialysis catheter terminates at the superior cavoatrial junction. Bilateral pleural effusions are present, right greater than left. Bilateral interstitial and airspace disease noted, right greater than left. IMPRESSION: Cardiomegaly with bilateral pleural effusions and bilateral interstitial and airspace disease, right greater than left. Findings are consistent with congestive heart failure. Electronically Signed   By: San Morelle M.D.   On: 06/08/2021 10:22    Medications: Infusions:   Scheduled Medications:   amLODipine  10 mg Oral Daily   budesonide  0.25 mg Nebulization BID   carvedilol  3.125 mg Oral Daily   Chlorhexidine Gluconate Cloth  6 each Topical Q0600   Chlorhexidine Gluconate Cloth  6 each Topical Q0600   feeding supplement (NEPRO CARB STEADY)  237 mL Oral TID BM   ipratropium-albuterol  3 mL Nebulization BID   levothyroxine  112 mcg Oral Q0600   multivitamin  1 tablet Oral QHS   pantoprazole  40 mg Oral BID   pravastatin  40 mg Oral q1800   Warfarin - Pharmacist Dosing Inpatient   Does not apply q1600    have reviewed scheduled and prn medications.  Physical Exam: General:NAD, comfortable, able to lie flat. Heart:RRR, s1s2 nl Lungs: Clear anteriorly, no wheezing Abdomen:soft, Non-tender, non-distended Extremities:No edema Dialysis Access: IJ TDC in place  Makenzi Bannister [employer] 06/09/2021,9:21 AM  LOS: 4 days

## 2021-06-09 NOTE — Progress Notes (Signed)
ANTICOAGULATION CONSULT NOTE - Follow Up Consult  Pharmacy Consult for Warfarin Indication: atrial fibrillation  No Known Allergies  Patient Measurements: Height: 5\' 7"  (170.2 cm) Weight: 78.3 kg (172 lb 9.9 oz) IBW/kg (Calculated) : 66.1  Vital Signs: Temp: 98.1 F (36.7 C) (09/06 0454) BP: 157/51 (09/06 0454) Pulse Rate: 59 (09/06 0454)  Labs: Recent Labs    06/06/21 1327 06/07/21 0500 06/08/21 0816 06/09/21 0513  HGB 8.7*  --  8.5*  --   HCT 27.5*  --  26.4*  --   PLT 249  --  226  --   LABPROT  --  21.1* 19.9* 22.3*  INR  --  1.8* 1.7* 2.0*  CREATININE 4.80*  --   --   --      Estimated Creatinine Clearance: 11.3 mL/min (A) (by C-G formula based on SCr of 4.8 mg/dL (H)).   Assessment: 80yo male on warfarin PTA for Afib. Admit INR 2.7 on PTA regimen of warfarin 5mg  Tue and Fri, 2.5 mg all other days (recently changed 8/18).  INR 1.7 to 2.0 is now therapeutic. Patient received an average of 1mg /d 8/27- 9/5 (after Vitamin K on 8/25) with INR 1.7 - 2.9. Patient also with poor PO intake. Hematuria continues, H/H, plt stable, ok to continue warfarin per team.  Goal of Therapy:  INR 2-3- target low end given hematuria Monitor platelets by anticoagulation protocol: Yes   Plan:  Schedule warfarin 1mg  PO daily   Daily PT/INR, decrease when stable Monitor CBC and for signs/symptoms of bleeding.    Benetta Spar, PharmD, BCPS, BCCP Clinical Pharmacist  Please check AMION for all [place] phone numbers After 10:00 PM, call [place] [phone]

## 2021-06-09 NOTE — Progress Notes (Signed)
Inpatient Rehabilitation  Patient information reviewed and entered into eRehab system by Murrel Freet M. Hiral Lukasiewicz, M.A., CCC/SLP, PPS Coordinator.  Information including medical coding, functional ability and quality indicators will be reviewed and updated through discharge.    

## 2021-06-09 NOTE — Progress Notes (Addendum)
Met with wife to discuss team conference goals of supervision-mod/I level and target discharge date of 9/9, then realized will need to start HD as OP on a weekday. Now discharge 9/10 after HD and then start OP-HD on Tuesday. Gave wife information on OP-HD [facility] in [place] T,TH S and chair time of 12;15. Pt is currently in HD and will see when returns. Will work on discharge needs. Discharge date now 9/10. Gave written information to wife in room.

## 2021-06-09 NOTE — Progress Notes (Signed)
Pt to have a TTS 12:15 chair time at [facility] ([id number]) at d/c. Pt will need to arrive at 11:45 on first day of treatment. Pt is scheduled to start at September 13. Start date can possibly be changed if needed.   Melven Sartorius  Renal Navigator [phone]

## 2021-06-09 NOTE — Progress Notes (Signed)
Physical Therapy Session Note  Patient Details  Name: [name] [name] MRN: [id number] Date of Birth: 05-25-1941  Today's Date: 06/09/2021 PT Individual Time: 4920-1007 PT Individual Time Calculation (min): 15 min   Short Term Goals: Week 1:  PT Short Term Goal 1 (Week 1): STG=LTG due to ELOS  Skilled Therapeutic Interventions/Progress Updates:    Patient seen to make up missed minutes.  Agreed to and performed in bed therex consisting of hooklying marching, hip abduction with green t-band, bridging, UE bicep curls x w/ green t-band, shoulder press with green t-band all x 15 reps and attempted sit ups with pt reporting too fatigued.  Left in supine with family in the room and all needs in reach.   Therapy Documentation Precautions:  Precautions Precautions: Fall Restrictions Weight Bearing Restrictions: No  Pain: Pain Assessment Pain Scale: 0-10 Pain Score: 0-No pain    Therapy/Group: Individual Therapy  Reginia Naas Magda Kiel, PT 06/09/2021, 11:56 AM

## 2021-06-09 NOTE — Progress Notes (Signed)
Occupational Therapy Note  Patient Details  Name: [name] [name] MRN: [id number] Date of Birth: [birth year]  Today's Date: 06/09/2021 OT Missed Time: 46 Minutes Missed Time Reason: Patient fatigue  Pt missed 60 min scheduled OT treatment session due to fatigue.  Pt received asleep in bed. Pt easily awakened and politely asked therapist to return at a later time. Pt stating "I'm finally sleeping after 18 days" and "I am sleeping up a storm right now".  Therapist will attempt to return later, pt and therapist scheduling depending.  Simonne Come 06/09/2021, 7:58 AM

## 2021-06-09 NOTE — Progress Notes (Signed)
Occupational Therapy Note  Patient Details  Name: [name] [name] MRN: [id number] Date of Birth: [birth year]  Attempted to see patient for make up time, however pt off unit for HD.  Simonne Come 06/09/2021, 2:05 PM

## 2021-06-10 DIAGNOSIS — I4821 Permanent atrial fibrillation: Secondary | ICD-10-CM

## 2021-06-10 LAB — PROTIME-INR
INR: 1.9 — ABNORMAL HIGH (ref 0.8–1.2)
Prothrombin Time: 21.8 seconds — ABNORMAL HIGH (ref 11.4–15.2)

## 2021-06-10 LAB — URINE CULTURE

## 2021-06-10 MED ORDER — WARFARIN SODIUM 1 MG PO TABS
1.5000 mg | ORAL_TABLET | Freq: Every day | ORAL | Status: DC
  Filled 2021-06-10: qty 1

## 2021-06-10 MED ORDER — CHLORHEXIDINE GLUCONATE CLOTH 2 % EX PADS
6.0000 | MEDICATED_PAD | Freq: Every day | CUTANEOUS | Status: DC
  Administered 2021-06-10: 6 via TOPICAL

## 2021-06-10 NOTE — Progress Notes (Addendum)
Physical Therapy Session Note  Patient Details  Name: BENZION MESTA MRN: [id number] Date of Birth: [birth year]  Today's Date: 06/10/2021     Skilled Therapeutic Interventions/Progress Updates: Pt in bed receiving breathing treatment upon entering room. Pt states heart doctor told him "not to do therapy today" due to "something with my blood". Nothing in chart currently regarding hold however respected pt's wishes to not participate today. Pt expressed concern as he states due to d/c this weekend. Provided comfort and left pt resting in bed with needs met. Pt missed 45 min skilled PT.      Therapy Documentation Precautions:  Precautions Precautions: Fall Restrictions Weight Bearing Restrictions: No General: PT Amount of Missed Time (min): 45 Minutes Vital Signs: Therapy Vitals Temp: 98.3 F (36.8 C) Pulse Rate: (!) 53 Resp: 18 BP: (!) 143/52 Patient Position (if appropriate): Lying Oxygen Therapy SpO2: 100 % O2 Device: Nasal Cannula O2 Flow Rate (L/min): 3 L/min  Therapy/Group: Individual Therapy  Bernardina Cacho Jarquavious Fentress, PTA  06/10/2021, 8:19 AM

## 2021-06-10 NOTE — Patient Care Conference (Cosign Needed)
Inpatient RehabilitationTeam Conference and Plan of Care Update Date: 06/09/2021   Time: 1:30 PM    Patient Name: [name] [name]      Medical Record Number: [id number]  Date of Birth: 1941-08-17 Sex: Male         Room/Bed: 4M12C/4M12C-01 Payor Info: Payor: [place] / Plan: [facility] MEDICARE / Product Type: *No Product type* /    Admit Date/Time:  06/05/2021  3:06 PM  Primary Diagnosis:  [facility] Problems: Principal Problem:   Debility    Expected Discharge Date: Expected Discharge Date: 06/13/21  Team Members Present: Physician leading conference: Dr. Leeroy Cha Social Worker Present: Ovidio Kin, LCSW Nurse Present: Dorthula Nettles, RN PT Present: Becky Sax, PT OT Present: Simonne Come, OT PPS Coordinator present : Gunnar Fusi, SLP     Current Status/Progress Goal Weekly Team Focus  Bowel/Bladder   Continent of b/b. Bloody urine. LBM 9/4  urine issue resolved. Maintain continence.  Assess Qshift and PRN   Swallow/Nutrition/ Hydration             ADL's   overall supervision assist for UB/LB ADLs, ambulatory toilet transfers with no AD and CGA, CGA for toileting tasks, supervision for sit<>stand, can be impulsive, needs cues for safety  Supervision overall  ADL retraining, dynamic standing balance, transfers, activity tolerance, d/c planning   Mobility   bed mobility supervision, transfers with/without AD min A, gait 110ft with RW min A.  supervision  functional mobility/transfers, generalized strengthening, dynamic standing balance/coordination, gait training, endurance, and D/C planning.   Communication             Safety/Cognition/ Behavioral Observations            Pain   c/o pain to bilateral hips/lower back. PRN available.  Pain <3  Assess Qshift and pRN   Skin   Skin intact. HD cath right chest.  Maintain skin integrity.  Assess Qshift and PRN     Discharge Planning:  Home with wife who is able to assist both hopeful he will do  well here   Team Discussion: Hematuria continues, continuing Coumadin. SOB, denies pain, stopped Coreg. Continent of bowel, complains of stomach pain, no sleep issues, skin is CDI. Had extra session of HD day before, participated minimally today. Mod I bed mobility, supervision transfers with RW up to 60 ft. Supervision/mod I goals. Requires a lot of motivation. Refused OT this morning, refused OT eval. Limited participation. Mod assist LBBD, min assist UBBD, has supervision goals. Patient on target to meet rehab goals: yes, limited participation, requires motivation.  *See Care Plan and progress notes for long and short-term goals.   Revisions to Treatment Plan:  MD adjusting medications.  Teaching Needs: Family education, medication management, pain management, transfer training, gait training, balance training, endurance training, safety awareness.  Current Barriers to Discharge: Decreased caregiver support, Medical stability, Home enviroment access/layout, Lack of/limited family support, Weight, Hemodialysis, Medication compliance, and Behavior  Possible Resolutions to Barriers: Continue current medications, provide emotional support.     Medical Summary Current Status: hematuria, shortness of breath, bradycardia, overweight (BMI 26.17)  Barriers to Discharge: Medical stability;Hemodialysis  Barriers to Discharge Comments: hematuria, shortness of breath, bradycardia, overweight (BMI 26.17) Possible Resolutions to [employer] Focus: continue warfarin after consultation with urology/cardiology/nephrology, continue respiratory treatments, continue daily INR checks, continue regular f/u with nephrology   Continued Need for Acute Rehabilitation Level of Care: The patient requires daily medical management by a physician with specialized training in physical medicine  and rehabilitation for the following reasons: Direction of a multidisciplinary physical rehabilitation program to maximize  functional independence : Yes Medical management of patient stability for increased activity during participation in an intensive rehabilitation regime.: Yes Analysis of laboratory values and/or radiology reports with any subsequent need for medication adjustment and/or medical intervention. : Yes   I attest that I was present, lead the team conference, and concur with the assessment and plan of the team.   Cristi Loron 06/10/2021, 9:53 AM

## 2021-06-10 NOTE — Progress Notes (Signed)
Occupational Therapy Session Note  Patient Details  Name: [name] [name] MRN: [id number] Date of Birth: [birth year]  Today's Date: 06/10/2021 OT Individual Time: 0930-1010 OT Individual Time Calculation (min): 40 min  and Today's Date: 06/10/2021 OT Missed Time: 20 Minutes Missed Time Reason: Patient fatigue   Short Term Goals: Week 1:  OT Short Term Goal 1 (Week 1): STGs = LTGs  Skilled Therapeutic Interventions/Progress Updates:    Treatment session with focus on functional mobility and endurance.  Pt received supine in bed reporting not feeling well and stating "I'm not dead yet".  Pt pointing out dark red colored urine and reports MDs are getting together to discuss his care.  Pt willing to engage in therapy session at bed level.  Engaged in bridging and clamshells bilaterally with focus on core strength and stability completing 2 sets of 10 each.  Therapist challenging pt to hold position for increased stability and strengthening.  Pt completing heel slides and hip flexion requiring frequent rest breaks between each set.  Pt reports having good days and bad days and just not having any energy this session. Pt stating "I'm done".  Pt missed final 20 mins due to fatigue.  Therapy Documentation Precautions:  Precautions Precautions: Fall Restrictions Weight Bearing Restrictions: No General: General OT Amount of Missed Time: 20 Minutes  Pain:  Pt with no c/o pain   Therapy/Group: Individual Therapy  Simonne Come 06/10/2021, 12:08 PM

## 2021-06-10 NOTE — Progress Notes (Signed)
ANTICOAGULATION CONSULT NOTE - Follow Up Consult  Pharmacy Consult for Warfarin Indication: atrial fibrillation  No Known Allergies  Patient Measurements: Height: 5\' 7"  (170.2 cm) Weight: 75 kg (165 lb 5.5 oz) IBW/kg (Calculated) : 66.1  Vital Signs: Temp: 98.3 F (36.8 C) (09/07 0503) BP: 143/52 (09/07 0503) Pulse Rate: 53 (09/07 0503)  Labs: Recent Labs    06/08/21 0816 06/09/21 0513 06/09/21 1340 06/10/21 0500  HGB 8.5*  --  8.2*  --   HCT 26.4*  --  25.8*  --   PLT 226  --  215  --   LABPROT 19.9* 22.3*  --  21.8*  INR 1.7* 2.0*  --  1.9*  CREATININE  --   --  2.79*  --      Estimated Creatinine Clearance: 19.4 mL/min (A) (by C-G formula based on SCr of 2.79 mg/dL (H)).   Assessment: 80yo male on warfarin PTA for Afib. Started new HD this admission. Admit INR 2.7 on PTA regimen of warfarin 5mg  Tue and Fri, 2.5 mg all other days (recently changed 8/18).  INR 1.9 is slightly subtherapeutic. Patient received an average of 1mg /d 8/27- 9/5 (after Vitamin K on 8/25) with INR 1.7 - 2.9. Patient also with poor PO intake. Hematuria continues, H/H, plt stable, ok to continue warfarin per team. Planning discharge on 9/10.   Goal of Therapy:  INR 2-3- target low end given hematuria Monitor platelets by anticoagulation protocol: Yes   Plan:  Schedule warfarin 1.5mg  PO daily   Next PT/INR 9/9, will suggest discharge dose then Monitor CBC and for signs/symptoms of bleeding.    Benetta Spar, PharmD, BCPS, BCCP Clinical Pharmacist  Please check AMION for all [place] phone numbers After 10:00 PM, call [place] [phone]

## 2021-06-10 NOTE — Plan of Care (Signed)
  Problem: RH Grooming Goal: LTG Patient will perform grooming w/assist,cues/equip (OT) Description: LTG: Patient will perform grooming with assist, with/without cues using equipment (OT) Flowsheets (Taken 06/10/2021 0935) LTG: Pt will perform grooming with assistance level of: Supervision/Verbal cueing Note: Downgraded to align with other supervision level goals and d/t fluctuating participation

## 2021-06-10 NOTE — Progress Notes (Signed)
Patient ID: [name] [name], male   DOB: [birth year], 80 y.o.   MRN: [id number]  Met with pt and wife to discuss discharge needs-they have needed equipment from wife's past surgeries. They prefer Home health due to HD makes him very tired. No pref of agency. Will find agency to take his insurance.

## 2021-06-10 NOTE — Progress Notes (Signed)
Physical Therapy Session Note  Patient Details  Name: [name] [name] MRN: [id number] Date of Birth: 1941-01-29  Today's Date: 06/10/2021 PT Individual Time: 1131-1155 and 6962-9528 PT Individual Time Calculation (min): 24 min and 8 min PT Missed Time: 37 minutes PT Missed Time Reason: fatigue  Short Term Goals: Week 1:  PT Short Term Goal 1 (Week 1): STG=LTG due to ELOS  Skilled Therapeutic Interventions/Progress Updates:   Treatment Session 1 Received pt supine in bed with wife present at bedside, pt agreeable to PT treatment, and denied any pain during session but reported feeling SOB despite being on 3L. Session with emphasis on functional mobility/transfers, generalized strengthening, dynamic standing balance/coordination, ambulation, simulated car transfers, and improved activity tolerance. Pt performed bed mobility mod I from flat bed using bedrails x 2 trials throughout session and donned shoes sitting EOB with set up assist. Sit<>stands with RW and supervision throughout and ambulated 58ft x 2 trials with RW and supervision to/from ortho gym with total A to manage O2 tank. Pt demonstrates flexed trunk, downward gaze, and decreased cadence. O2 sat 96% on 3L O2. Pt ambulated 41ft x 2 trials with RW and supervision to/from car and performed simulated car transfer with RW and supervision. O2 sat 97% afterwards and pt denied SOB. Pt requested to return to bed at end of session. Concluded session with pt supine in bed, needs within reach, and bed alarm on.   Treatment Session 2 Received pt supine in bed watching TV with wife present at bedside. Upon therapist's entry pt stated "not again" and reported "not feeling well" but did not elaborate. Encouraged OOB mobility, however pt refused and requested to do exercises in bed. Encouraged pt to independently work on HEP provided to him and use this therapy time to work on OOB mobility and endurance, especially since working with OT on bed level  exercises earlier. Discussed steps as pt reports having 3 to enter. Pt verbalized confidence with steps and declined practicing today stating "we can do that tomorrow". Referred to pt's therapy schedule and discussed what else he did in previous sessions and pt stated "that's just too much therapy", ultimately requesting to rest. Pt did agree to participate in final PT session at 2:15 this afternoon. Concluded session with pt supine in bed, needs within reach, and bed alarm on. 37 minutes missed of skilled physical therapy due to fatigue.   Therapy Documentation Precautions:  Precautions Precautions: Fall Restrictions Weight Bearing Restrictions: No  Therapy/Group: Individual Therapy Alfonse Alpers PT, DPT   06/10/2021, 7:43 AM

## 2021-06-10 NOTE — Progress Notes (Signed)
Physical Therapy Session Note  Patient Details  Name: ADRIANN BALLWEG MRN: [id number] Date of Birth: 03/21/1941  Today's Date: 06/10/2021 PT Missed Time: 74 Minutes Missed Time Reason: Patient fatigue  Short Term Goals: Week 1:  PT Short Term Goal 1 (Week 1): STG=LTG due to ELOS  Skilled Therapeutic Interventions/Progress Updates:    Pt in bed on arrival. Stated he was fatigued from previous sessions and unable to participate in further therapy today. Therapist offered to help pt to bathroom if needed, pt refused. Pt missed 30 min of scheduled PT.  Therapy Documentation Precautions:  Precautions Precautions: Fall Restrictions Weight Bearing Restrictions: No General: PT Amount of Missed Time (min): 30 Minutes PT Missed Treatment Reason: Patient fatigue    Therapy/Group: Individual Therapy  Mickel Fuchs 06/10/2021, 2:58 PM

## 2021-06-10 NOTE — Consult Note (Addendum)
Cardiology Consultation:   Patient ID: [name] [name]; [id number]; Nov 27, 1940   Admit date: 06/05/2021 Date of Consult: 06/10/2021  Primary Care Provider: Claretta Fraise, MD Primary Cardiologist: [name] [name]/ [name] [name] Primary Electrophysiologist: [name] [name]  Patient Profile:   [name] [name] is a 80 y.o. male with a hx of CAD s/p CABG 1998 with most recent [place] 10/17 showing occluded native vessels with occluded SVG-dLAD and SVG-RCA with 85% stenosis of the ostial LIMA to LAD with no interventional options noted, chronic systolic CHF with initial EF at 25-30%, chronic atrial fibrillation on long term anticoagulation with Coumadin,  who is being seen today for anticoagulation and CHF medication assistance at the request of [name] [name].   History of Present Illness:   [name] [name] is an 80yo M with a hx as stated above who presented to [facility] on 05/22/2021 with UTI with progressive weakness and fatigue. In the ED blood pressure noted to be 181/59, BUN 91, creatinine 10.49 from a recent baseline 2.52, hemoglobin 7.6.  Renal ultrasound showed some left renal atrophy with right renal cyst or minimally complex cyst and no hydronephrosis. Nephrology was consulted for severe AKI on CKD with hemodialysis initiated. He was started on HD on a Tuesday Thursday Saturday. He was transferred to Rehab 06/05/21 with anticipation of discharge 06/13/21. Cardiology has been asked to see for CHF and coumadin recommendations.   He is followed by [name] [name] for general cardiology and [name] [name] for AHF. He underwent CABG in 1998 with last [place] 10/17 which showed occluded native vessels and patent SVG>OM and LIMA>>LAD with no interventional targets. Echocardiogram at that time showed an EF at 25-30% with mildly dilated RV with decreased RV systolic function. He subsequently had a Platea placed which is followed by [name] [name]. Repeat echo 08/2018 with improved function at 55-60%. He has been treated with  carvedilol, Entresto, spironolactone, Jardiance. He has chronic atrial fibrillation and is anticoagulated with Coumadin. He has known severe COPD with prior PFTs showing severe obstruction 11/2018. He has PAD and was recent referred to [name] [name] for further workup. He has previously followed with [name] [name] for carotid stenosis with plans for repeat dopplers   He was last seen by [name] [name] 10/23/2020 and had reported chronic SOB although this was felt to mostly be driven by severe COPD. He last saw [name] [name] 02/24/21. His exam and vascular studies were felt to be consistent with severe iliac disease bilaterally. He had been exercising regularly with some improvement and was not felt to have evidence of critical limb ischemia with plans to continue medical therapy.   Past Medical History:  Diagnosis Date   AICD (automatic cardioverter/defibrillator) present 04/05/2017   Anemia    Arthritis    "hips; back" (07/09/2014)   Asthma    Atrial fibrillation (HCC)    CAD (coronary artery disease)    CHF (congestive heart failure) ([place])    Cholecystitis 11/2013   CKD (chronic kidney disease) 2015   Stage  3.    COPD (chronic obstructive pulmonary disease) (HCC)    on home oxygen, 2 liters at night10/2015   Esophageal reflux    Gallstones    Gastric antral vascular ectasia 2013   Gout    Heme positive stool    Hepatomegaly    Hiatal hernia    History of blood transfusion ~ 2012   "blood count dropped; had to get 3 units"   Hyperlipidemia    Hypothyroidism    Other  and unspecified coagulation defects    Personal history of colonic polyps 05/29/2010   TUBULAR ADENOMA   Unspecified essential hypertension     Past Surgical History:  Procedure Laterality Date   CARDIAC CATHETERIZATION N/A 07/15/2016   Procedure: Right/Left Heart Cath and Coronary/Graft Angiography;  Surgeon: [name] Crome, MD;  Location: [place] [facility];  Service: Cardiovascular;  Laterality: N/A;   cholecystomy tube  12/28/2014 -  01/06/2015   tube clogged with debris, removed and IR unable to place new tube.    COLONOSCOPY  2013   Dr. Sharlett [name]: no polyps or evidence of active bleeding   CORONARY ANGIOPLASTY WITH STENT PLACEMENT  ~ 2008; 07/08/2014   "1 + 3"   CORONARY ARTERY BYPASS GRAFT  1998   CABG X4   ESOPHAGOGASTRODUODENOSCOPY  2013   Dr. 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 Sharlett [name]: AVM at 42 and blood at 30 minutes beyond first duodenal image but not actual lesion seen. If persistent IDA, bleeding, recommend enteroscopy with ablation    HERNIA REPAIR     ICD IMPLANT N/A 04/05/2017   Procedure: ICD Implant;  Surgeon: [name] Lance, MD;  Location: [place] [facility];  Service: Cardiovascular;  Laterality: N/A;   IR FLUORO GUIDE CV LINE RIGHT  05/27/2021   IR FLUORO GUIDE CV LINE RIGHT  06/02/2021   IR US GUIDE VASC ACCESS RIGHT  05/27/2021   LEFT AND RIGHT HEART CATHETERIZATION WITH CORONARY/GRAFT ANGIOGRAM N/A 07/09/2014   Right and left heart cath, bare metal stent to SVG to RCA. [name] Grooms, MD;    UMBILICAL HERNIA REPAIR  [phone]     Prior to Admission medications   Medication Sig Start Date End Date Taking? Authorizing Provider  acetaminophen (TYLENOL) 500 MG tablet Take 500 mg by mouth every 6 (six) hours as needed for headache or mild pain.    [provider]  albuterol (VENTOLIN HFA) 108 (90 Base) MCG/ACT inhaler USE 2 INHALATIONS BY MOUTH  EVERY 6 HOURS AS NEEDED FOR SHORTNESS OF BREATH Patient taking differently: Inhale 2 puffs into the lungs every 6 (six) hours as needed for shortness of breath. 04/17/21   [name] Fraise, MD  amLODipine (NORVASC) 10 MG tablet Take 1 tablet (10 mg total) by mouth daily. 06/06/21   Dahal, Marlowe Aschoff,  MD  budesonide (PULMICORT) 0.25 MG/2ML nebulizer solution USE 1 VIAL  IN  NEBULIZER TWICE  DAILY - rinse mouth after treatment Patient taking differently: Take 0.25 mg by nebulization See admin instructions. USE 1 VIAL  IN  NEBULIZER TWICE DAILY - rinse mouth after treatment 03/03/21   [name] Fraise, MD  carvedilol (COREG) 3.125 MG tablet Take 1 tablet (3.125 mg total) by mouth 2 (two) times daily with a meal. 06/05/21   Dahal, Marlowe Aschoff, MD  Cholecalciferol (VITAMIN D3) 5000 UNITS CAPS Take 5,000 Units by mouth daily.    [provider]  dextromethorphan-guaiFENesin (MUCINEX DM) 30-600 MG 12hr tablet Take 1 tablet by mouth 2 (two) times daily as needed for cough. 06/05/21   Terrilee Croak, MD  docusate sodium (COLACE) 100 MG capsule Take 100 mg by mouth daily as needed for mild constipation.    [provider]  ferrous sulfate 325 (65 FE) MG tablet Take 325 mg by mouth  daily with breakfast.    [provider]  ipratropium-albuterol (DUONEB) 0.5-2.5 (3) MG/3ML SOLN USE 1 VIAL IN NEBULIZER 4 TIMES DAILY Patient taking differently: Take 3 mLs by nebulization 4 (four) times daily. 03/03/21   [name] Fraise, MD  levothyroxine (SYNTHROID) 112 MCG tablet TAKE 1 TABLET BY MOUTH  DAILY BEFORE BREAKFAST Patient taking differently: Take 112 mcg by mouth daily before breakfast. 10/17/20   [name] Fraise, MD  loratadine (CLARITIN) 10 MG tablet Take 10 mg by mouth daily as needed (for allergies.).     [provider]  Multiple Vitamins-Minerals (MULTIVITAMINS THER. W/MINERALS) TABS Take 1 tablet by mouth daily.    [provider]  nitroGLYCERIN (NITROSTAT) 0.4 MG SL tablet Place 1 tablet (0.4 mg total) under the tongue every 5 (five) minutes as needed for chest pain. 03/18/17   Shirley Friar, PA-C  OXYGEN Inhale 2 L/min into the lungs See admin instructions. 2 L/min at bedtime and as needed daily as needed for shortness of breath    [provider]   pantoprazole (PROTONIX) 40 MG tablet Take 1 tablet (40 mg total) by mouth 2 (two) times daily. Patient taking differently: Take 40 mg by mouth in the morning and at bedtime. 12/12/20   [name] Fraise, MD  senna-docusate (SENOKOT-S) 8.6-50 MG tablet Take 1 tablet by mouth at bedtime as needed for moderate constipation. 06/05/21   Terrilee Croak, MD  traZODone (DESYREL) 50 MG tablet Take 1 tablet (50 mg total) by mouth at bedtime as needed for sleep. 06/05/21   Terrilee Croak, MD  warfarin (COUMADIN) 2.5 MG tablet Take 1 tablet (2.5 mg total) by mouth daily. 06/05/21   Terrilee Croak, MD    Inpatient Medications: Scheduled Meds:  amLODipine  10 mg Oral Daily   budesonide  0.25 mg Nebulization BID   Chlorhexidine Gluconate Cloth  6 each Topical Q0600   Chlorhexidine Gluconate Cloth  6 each Topical Q0600   Chlorhexidine Gluconate Cloth  6 each Topical Q0600   feeding supplement (NEPRO CARB STEADY)  237 mL Oral TID BM   ipratropium-albuterol  3 mL Nebulization BID   levothyroxine  112 mcg Oral Q0600   multivitamin  1 tablet Oral QHS   pantoprazole  40 mg Oral BID   pravastatin  40 mg Oral q1800   warfarin  1.5 mg Oral q1600   Warfarin - Pharmacist Dosing Inpatient   Does not apply q1600   Continuous Infusions:  PRN Meds: acetaminophen, albuterol, dextromethorphan-guaiFENesin, senna-docusate, traZODone  Allergies:   No Known Allergies  Social History:   Social History   Socioeconomic History   Marital status: Married    Spouse name: [name]   Number of children: 2   Years of education: 12   Highest education level: High school graduate  Occupational History   Occupation: retired    Comment: [employer]  Tobacco Use   Smoking status: Former    Packs/day: 1.00    Years: 42.00    Pack years: 42.00    Types: Cigarettes    Quit date: 12/16/1996    Years since quitting: 24.4   Smokeless tobacco: Never  Vaping Use   Vaping Use: Never used  Substance and Sexual Activity    Alcohol use: Yes    Alcohol/week: 1.0 standard drink    Types: 1 Cans of beer per week    Comment: 1 per month   Drug use: No   Sexual activity: Not Currently    Birth control/protection: None  Other Topics  Concern   Not on file  Social History Narrative   Not on file   Social Determinants of Health   Financial Resource Strain: Not on file  Food Insecurity: Not on file  Transportation Needs: Not on file  Physical Activity: Not on file  Stress: Not on file  Social Connections: Not on file  Intimate Partner Violence: Not on file    Family History:   Family History  Problem Relation Age of Onset   Leukemia Mother    Kidney disease Mother        kidney removed    Heart attack Brother    Heart disease Brother    Heart disease Father    Stomach cancer Sister    Stomach cancer Sister    Early death Brother    Hypertension Other        family    Colon cancer Neg Hx    Family Status:  Family Status  Relation Name Status   Sister garnet Alive   Mother  Deceased at age 42's   Brother [place] Alive   Father  Deceased   Sister bobby Alive   Sister shelby Deceased   Sister Oceanographer Deceased   Brother (baby) Deceased   Brother cecil Deceased   Other  (Not Specified)   Daughter wendy Alive   Son [place] Alive   MGM  Deceased   MGF  Deceased   PGM  Deceased   PGF  Deceased   Neg Hx  (Not Specified)    ROS:  Please see the history of present illness.  All other ROS reviewed and negative.     Physical Exam/Data:   Vitals:   06/09/21 1800 06/09/21 2021 06/10/21 0500 06/10/21 0503  BP: (!) 127/51 (!) 156/57  (!) 143/52  Pulse: 60 (!) 58  (!) 53  Resp: 17 18  18   Temp: 98.1 F (36.7 C) 98.1 F (36.7 C)  98.3 F (36.8 C)  TempSrc: Oral     SpO2: 100% 100%  100%  Weight:   75 kg   Height:        Intake/Output Summary (Last 24 hours) at 06/10/2021 1425 Last data filed at 06/09/2021 1545 Gross per 24 hour  Intake --  Output 2000 ml  Net -2000 ml   Filed Weights    06/09/21 1310 06/09/21 1555 06/10/21 0500  Weight: 75.8 kg 73.8 kg 75 kg   Body mass index is 25.9 kg/m.   General: Well developed, well nourished, NAD Neck: Negative for carotid bruits. No JVD Lungs:Clear to ausculation bilaterally. Breathing is unlabored. Cardiovascular: RRR with S1 S2. No murmurs Abdomen: Soft, non-tender, non-distended. No obvious abdominal masses. Extremities: No edema. Radial pulses 2+ bilaterally Neuro: Alert and oriented. No focal deficits. No facial asymmetry. MAE spontaneously. Psych: Responds to questions appropriately with normal affect.    Relevant CV Studies:  Echocardiogram 10/06/2020:   1. Left ventricular ejection fraction, by estimation, is 55%. The left  ventricle has normal function. The left ventricle has no regional wall  motion abnormalities. There is moderate asymmetric left ventricular  hypertrophy of the septal segment. Left  ventricular diastolic parameters are indeterminate.   2. Right ventricular systolic function is mildly reduced. The right  ventricular size is mildly enlarged. Tricuspid regurgitation signal is  inadequate for assessing PA pressure.   3. Left atrial size was severely dilated.   4. Right atrial size was moderately dilated.   5. The mitral valve is grossly normal. Mild mitral valve regurgitation.  No evidence of mitral stenosis.   6. The aortic valve is grossly normal. There is mild calcification of the  aortic valve. Aortic valve regurgitation is not visualized. Mild aortic  valve sclerosis is present, with no evidence of aortic valve stenosis.   Comparison(s): A prior study was performed on 08/22/2018. No significant  change from prior study. Prior images reviewed side by side.   [name] arterial study 11/14/2020:  Summary:   Aorta-Iliac: >50% stenosis of the bilateral distal common iliac  arteries/proximal external iliac areas, with the left appearing more  severe in nature. Poor visualization and insonation due  to overlying bowel  gas, suggest alternative imaging or fasting  aorta-iliac duplex if clinically indicated.     Right: 50-74% disease in the common femoral artery (low-end). Diffuse  30-49% disease of the superficial femoral artery. Occlusion of the  posterior tibial artery with some reconstituted flow. Occlusion of the  peroneal artery.   Left: 50-74% disease of the common femoral artery. 30-49% disease of the  superficial femoral artery. Occlusion of the peroneal artery.   LHC 07/2016:   Prox LAD lesion, 100 %stenosed. Mid LAD to Dist LAD lesion, 100 %stenosed. Prox Cx lesion, 95 %stenosed. Ost 2nd Mrg to 2nd Mrg lesion, 100 %stenosed. Dist Cx lesion, 100 %stenosed. Prox RCA to Mid RCA lesion, 100 %stenosed. SVG graft was visualized by angiography and is small. Origin to Prox Graft lesion, 100 %stenosed. SVG and is small. Origin to Prox Graft lesion, 100 %stenosed. Ost 1st Mrg to 1st Mrg lesion, 100 %stenosed. SVG and is anatomically normal. Dist RCA lesion, 100 %stenosed. LIMA and is normal in caliber. Origin to Prox Graft lesion, 80 %stenosed. There is moderate to severe left ventricular systolic dysfunction. LV end diastolic pressure is mildly elevated. There is moderate (3+) mitral regurgitation. Hemodynamic findings consistent with moderate pulmonary hypertension and mitral valve regurgitation.   Bypass graft failure with total occlusion of the SVG to the distal LAD, total occlusion of the SVG to RCA, and 75-80% stenosis in the ostium of the LIMA to the LAD. The LIMA stenosis is stable compared to 2 years ago. The occlusion of the RCA SVG is new. Total occlusion of native RCA, circumflex, and ostial LAD. Regional wall motion abnormality, mild LV enlargement, and left ventricular ejection fraction of 25-30%. Moderate mitral regurgitation with V-wave of 30-35 mmHg.   RECOMMENDATIONS:   Consider ICD implantation and resynchronization if indicated. Referred to advanced  heart failure clinic.  Laboratory Data:  Chemistry Recent Labs  Lab 06/05/21 1608 06/06/21 1327 06/09/21 1340  NA 133* 132* 132*  K 3.8 3.9 3.5  CL 98 97* 98  CO2 27 27 29   GLUCOSE 98 104* 118*  BUN 27* 33* 13  CREATININE 4.41* 4.80* 2.79*  CALCIUM 8.0* 8.2* 8.0*  GFRNONAA 13* 12* 22*  ANIONGAP 8 8 5     Total Protein  Date Value Ref Range Status  05/23/2021 5.3 (L) 6.5 - 8.1 g/dL Final  01/06/2021 6.2 6.0 - 8.5 g/dL Final   Albumin  Date Value Ref Range Status  06/09/2021 2.4 (L) 3.5 - 5.0 g/dL Final  01/06/2021 4.1 3.6 - 4.6 g/dL Final   AST  Date Value Ref Range Status  05/23/2021 13 (L) 15 - 41 U/L Final   ALT  Date Value Ref Range Status  05/23/2021 7 0 - 44 U/L Final   Alkaline Phosphatase  Date Value Ref Range Status  05/23/2021 33 (L) 38 - 126 U/L Final   Total Bilirubin  Date Value Ref Range Status  05/23/2021 0.6 0.3 - 1.2 mg/dL Final   Bilirubin Total  Date Value Ref Range Status  01/06/2021 0.3 0.0 - 1.2 mg/dL Final   Hematology Recent Labs  Lab 06/06/21 1327 06/08/21 0816 06/09/21 1340  WBC 10.4 11.7* 7.9  RBC 2.83* 2.74* 2.65*  HGB 8.7* 8.5* 8.2*  HCT 27.5* 26.4* 25.8*  MCV 97.2 96.4 97.4  MCH 30.7 31.0 30.9  MCHC 31.6 32.2 31.8  RDW 13.8 13.9 13.9  PLT 249 226 215   Cardiac EnzymesNo results for input(s): TROPONINI in the last 168 hours. No results for input(s): TROPIPOC in the last 168 hours.  BNPNo results for input(s): BNP, PROBNP in the last 168 hours.  DDimer No results for input(s): DDIMER in the last 168 hours. TSH:  Lab Results  Component Value Date   TSH 2.190 09/09/2020   Lipids: Lab Results  Component Value Date   CHOL 96 (L) 09/09/2020   HDL 41 09/09/2020   LDLCALC 41 09/09/2020   TRIG 59 09/09/2020   CHOLHDL 2.3 09/09/2020   HgbA1c: Lab Results  Component Value Date   HGBA1C 5.4 05/23/2021    Radiology/Studies:  DG CHEST PORT 1 VIEW  Result Date: 06/08/2021 CLINICAL DATA:  Shortness of breath. EXAM:  PORTABLE CHEST 1 VIEW COMPARISON:  Two-view chest x-ray 07/14/2020 FINDINGS: Heart is enlarged. Atherosclerotic changes are noted at the aortic arch. Tunneled right IJ dialysis catheter terminates at the superior cavoatrial junction. Bilateral pleural effusions are present, right greater than left. Bilateral interstitial and airspace disease noted, right greater than left. IMPRESSION: Cardiomegaly with bilateral pleural effusions and bilateral interstitial and airspace disease, right greater than left. Findings are consistent with congestive heart failure. Electronically Signed   By: San Morelle M.D.   On: 06/08/2021 10:22    Assessment and Plan:   1. Chronic systolic and diastolic CHF: -Pt is followed by [name] [name] and [name] [name]. He was found to have an initial LVEF 25-30% with improvement in LV function on repeat echocardiogram. He was sent to the ED 05/22/2021 with UTI with progressive weakness and fatigue. In the ED blood pressure noted to be 181/59, BUN 91, creatinine 10.49 from a recent baseline 2.52, hemoglobin 7.6. Renal ultrasound showed some left renal atrophy with right renal cyst or minimally complex cyst and no hydronephrosis. Nephrology was consulted for severe AKI on CKD with hemodialysis initiation. He was started on HD on a Tuesday Thursday Saturday schedule with the most recent creatinine at 2.79. Long term plan is for OP HD per nephrology in hopes of renal recovery  -PTA medications include amlodipine and carvedilol however noted to be on carvedilol, Entresto, spironolactone, Jardiance. -He will be ok to continue carvedilol and amlodipine for now. -Would hold Entresto if in fact he is on this. Follow up with AHF team.  -Will obtain echo this admission   2. Gross hematuria with chronic AC use secondary to chronic AF: -Noted to be intermittent during his hospitalization however has progressed. Urology was consulted with concern for right renal pelvic mass felt to likely be blood  products, not felt to be stones per their note from 8/20. Plan are to follow in the OP setting. Cardiology has been consulted due to chronic AC.  -Would recommend holding AC for now given hematuria. May be beneficial to re-consult urology for their thoughts prior to discharge.    3. AKI/CKD stage IV: -Initial creatinine >10.0 on admission followed by nephrology during his hospital course. HD was  initiated with some improvement in renal function however not yet recovered. He will require OP HD for now and close follow up with nephrology.    4. AF: -Long standing AF with no plans to establish NSR. He is rate controlled and has been on anticoagulation with coumadin. Pharmacy has been following with last INR at 1.9 from 06/09/21.  5. Peripheral arterial disease:   -Followed by [name] [name] with studies consistent with severe iliac disease bilaterally. No evidence of critical limb ischemia. Iliac disease noted to be approachable percutaneously however plans to continue medical therapy.    6. CAD s/p CABG: -No anginal symptoms -No ASA due to chronic     For questions or updates, please contact [place] Please consult [email or web address] for contact info under Cardiology/STEMI.   Signed, Kathyrn Drown NP-C [facility] Pager: [phone] 06/10/2021 2:25 PM As above, patient seen and examined.  Briefly he is an 80 year old male with past medical history of coronary artery disease status post coronary artery bypass and graft, chronic combined systolic/diastolic congestive heart failure (previously noted to have reduced LV function but most recent echocardiogram January 2022 showed normal LV function, moderate left ventricular hypertrophy, biatrial enlargement, mild mitral regurgitation), prior ICD, permanent atrial fibrillation; admitted with UTI and acute renal failure with creatinine 10.49 and BUN 91, hgb 7.6. Patient was initiated on hemodialysis.  He was also noted to have hematuria and was assessed by  urology.  Cardiology now asked to evaluate for medication management for history of heart failure and also need for continued Coumadin in the setting of hematuria.  Patient notes dyspnea and fatigue with activities.  No orthopnea, PND, pedal edema, chest pain or syncope.  He has gross hematuria. BUN 13 and creatinine 2.79, hemoglobin 8.2, INR 1.9.  1 permanent atrial fibrillation-patient's CHA2DS2-VASc is 5.  He would benefit from anticoagulation long-term.  However he is having gross hematuria which I saw in his room.  I therefore feel that we should hold Coumadin until he is evaluated further by urology.  Can resume as an outpatient once hematuria improves.  He understands there is risk of embolic event off of anticoagulation but I feel there is no other choice at this point.  Would hold anticoagulation until he is seen in the office by [name] [name].  2 history of cardiomyopathy-we will repeat echocardiogram.  If LV function remains normal we will continue with present medications including amlodipine.  If reduced we will discontinue amlodipine and instead treat with hydralazine/nitrates and add low-dose carvedilol.  He is not a candidate for ARB or Entresto in the setting of acute renal failure (patient is being dialyzed but there is some optimism that his renal function may improve and not require dialysis in the future).  3 acute on chronic kidney disease-patient is now on dialysis which is being managed by nephrology.  4 chronic combined systolic/diastolic congestive heart failure-patient appears to be euvolemic on exam though does complain of some dyspnea.  May need lower dry weight.  We will leave to nephrology.  5 coronary artery disease-continue statin.  6 Prior ICD  7 hypertension-follow blood pressure closely as we will likely need to decrease antihypertensive regimen as he continues with dialysis.  Kirk Ruths, MD

## 2021-06-10 NOTE — Discharge Summary (Addendum)
Physician Discharge Summary  Patient ID: [name] [name] MRN: [id number] DOB/AGE: 03-07-1941 80 y.o.  Admit date: 06/05/2021 Discharge date: 06/13/2021  Discharge Diagnoses:  Principal Problem:   Debility DVT prophylaxis AKI on CKD Acute on chronic anemia COPD Diastolic congestive heart failure Atrial fibrillation Hyperlipidemia GERD Hypothyroidism Overweight BMI 27.97 Hematuria Right renal pelvic mass  Discharged Condition: Stable  Significant Diagnostic Studies: US RENAL  Result Date: 05/23/2021 CLINICAL DATA:  "Bladder neck obstruction". EXAM: RENAL / URINARY TRACT ULTRASOUND COMPLETE COMPARISON:  05/13/2021 CT FINDINGS: Right Kidney: Renal measurements: 11.0 by 6.6 x 5.6 cm = volume: 211 mL. No hydronephrosis. A lower pole 1.6 cm markedly hypoechoic to anechoic lesion. Left Kidney: Renal measurements: 8.0 x 3.1 x 2.9 cm = volume: 38.1 mL. Moderately atrophic. No hydronephrosis. Bladder: Appears normal for degree of bladder distention. Other: None. IMPRESSION: 1. Left renal atrophy. 2. Lower pole right renal cyst or minimally complex cyst. Electronically Signed   By: Abigail Miyamoto M.D.   On: 05/23/2021 08:32   IR Fluoro Guide CV Line Right  Result Date: 05/27/2021 INDICATION: 80 year old male with acute kidney injury on chronic kidney disease requiring central venous access for hemodialysis. EXAM: NON-TUNNELED CENTRAL VENOUS HEMODIALYSIS CATHETER PLACEMENT WITH ULTRASOUND AND FLUOROSCOPIC GUIDANCE COMPARISON:  None. MEDICATIONS: None FLUOROSCOPY TIME:  0 minutes, 6 seconds (2 mGy) COMPLICATIONS: None immediate. PROCEDURE: Informed written consent was obtained from the patient after a discussion of the risks, benefits, and alternatives to treatment. Questions regarding the procedure were encouraged and answered. The right neck and chest were prepped with chlorhexidine in a sterile fashion, and a sterile drape was applied covering the operative field. Maximum barrier sterile technique  with sterile gowns and gloves were used for the procedure. A timeout was performed prior to the initiation of the procedure. After the overlying soft tissues were anesthetized, a small venotomy incision was created and a micropuncture kit was utilized to access the internal jugular vein. Real-time ultrasound guidance was utilized for vascular access including the acquisition of a permanent ultrasound image documenting patency of the accessed vessel. The microwire was utilized to measure appropriate catheter length. A stiff glidewire was advanced to the level of the IVC. Under fluoroscopic guidance, the venotomy was serially dilated, ultimately allowing placement of a 12 French 24 cm temporary Trialysis catheter with tip ultimately terminating within the superior aspect of the right atrium. Final catheter positioning was confirmed and documented with a spot radiographic image. The catheter aspirates and flushes normally. The catheter was flushed with appropriate volume heparin dwells. The catheter exit site was secured with a 0-Prolene retention suture. A dressing was placed. The patient tolerated the procedure well without immediate post procedural complication. IMPRESSION: Successful placement of a right internal jugular approach 24 cm temporary dialysis catheter with tip terminating with in the superior aspect of the right atrium. The catheter is ready for immediate use. PLAN: This catheter may be converted to a tunneled dialysis catheter at a later date as indicated. Ruthann Cancer, MD [facility] Radiology Electronically Signed   By: Ruthann Cancer M.D.   On: 05/27/2021 11:04   IR US Guide Vasc Access Right  Result Date: 05/27/2021 INDICATION: 80 year old male with acute kidney injury on chronic kidney disease requiring central venous access for hemodialysis. EXAM: NON-TUNNELED CENTRAL VENOUS HEMODIALYSIS CATHETER PLACEMENT WITH ULTRASOUND AND FLUOROSCOPIC  GUIDANCE COMPARISON:  None. MEDICATIONS: None FLUOROSCOPY TIME:  0 minutes, 6 seconds (2 mGy) COMPLICATIONS: None immediate. PROCEDURE: Informed written consent was obtained from  the patient after a discussion of the risks, benefits, and alternatives to treatment. Questions regarding the procedure were encouraged and answered. The right neck and chest were prepped with chlorhexidine in a sterile fashion, and a sterile drape was applied covering the operative field. Maximum barrier sterile technique with sterile gowns and gloves were used for the procedure. A timeout was performed prior to the initiation of the procedure. After the overlying soft tissues were anesthetized, a small venotomy incision was created and a micropuncture kit was utilized to access the internal jugular vein. Real-time ultrasound guidance was utilized for vascular access including the acquisition of a permanent ultrasound image documenting patency of the accessed vessel. The microwire was utilized to measure appropriate catheter length. A stiff glidewire was advanced to the level of the IVC. Under fluoroscopic guidance, the venotomy was serially dilated, ultimately allowing placement of a 12 French 24 cm temporary Trialysis catheter with tip ultimately terminating within the superior aspect of the right atrium. Final catheter positioning was confirmed and documented with a spot radiographic image. The catheter aspirates and flushes normally. The catheter was flushed with appropriate volume heparin dwells. The catheter exit site was secured with a 0-Prolene retention suture. A dressing was placed. The patient tolerated the procedure well without immediate post procedural complication. IMPRESSION: Successful placement of a right internal jugular approach 24 cm temporary dialysis catheter with tip terminating with in the superior aspect of the right atrium. The catheter is ready for immediate use. PLAN: This catheter may be converted to a  tunneled dialysis catheter at a later date as indicated. Ruthann Cancer, MD [facility] Radiology Electronically Signed   By: Ruthann Cancer M.D.   On: 05/27/2021 11:04   DG CHEST PORT 1 VIEW  Result Date: 06/08/2021 CLINICAL DATA:  Shortness of breath. EXAM: PORTABLE CHEST 1 VIEW COMPARISON:  Two-view chest x-ray 07/14/2020 FINDINGS: Heart is enlarged. Atherosclerotic changes are noted at the aortic arch. Tunneled right IJ dialysis catheter terminates at the superior cavoatrial junction. Bilateral pleural effusions are present, right greater than left. Bilateral interstitial and airspace disease noted, right greater than left. IMPRESSION: Cardiomegaly with bilateral pleural effusions and bilateral interstitial and airspace disease, right greater than left. Findings are consistent with congestive heart failure. Electronically Signed   By: San Morelle M.D.   On: 06/08/2021 10:22   ECHOCARDIOGRAM COMPLETE  Result Date: 06/11/2021    ECHOCARDIOGRAM REPORT   Patient Name:   [name] [name] Date of Exam: 06/11/2021 Medical Rec #:  [id number]       Height:       67.0 in Accession #:    [id number]      Weight:       162.9 lb Date of Birth:  12-Mar-1941       BSA:          1.853 m Patient Age:    54 years        BP:           151/49 mmHg Patient Gender: M               HR:           61 bpm. Exam Location:  Inpatient Procedure: 2D Echo, Color Doppler and Cardiac Doppler Indications:    K55.37 Acute systolic (congestive) heart failure  History:        Patient has prior history of Echocardiogram examinations, most  recent 11/04/2020. CHF, Defibrillator, COPD, Arrythmias:Atrial                 Fibrillation; Risk Factors:Hypertension and Dyslipidemia.  Sonographer:    Leta Jungling RDCS Referring Phys: [id number] JILL D MCDANIEL IMPRESSIONS  1. Left ventricular ejection fraction, by estimation, is 50 to 55%. The left ventricle has low normal function. The left  ventricle has no regional wall motion abnormalities. There is moderate left ventricular hypertrophy. Left ventricular diastolic parameters are indeterminate.  2. Right ventricular systolic function is mildly reduced. The right ventricular size is mildly enlarged. There is mildly elevated pulmonary artery systolic pressure.  3. Left atrial size was severely dilated.  4. The mitral valve is normal in structure. Mild mitral valve regurgitation. No evidence of mitral stenosis.  5. The aortic valve is tricuspid. Aortic valve regurgitation is not visualized. Mild to moderate aortic valve sclerosis/calcification is present, without any evidence of aortic stenosis. FINDINGS  Left Ventricle: Left ventricular ejection fraction, by estimation, is 50 to 55%. The left ventricle has low normal function. The left ventricle has no regional wall motion abnormalities. The left ventricular internal cavity size was normal in size. There is moderate left ventricular hypertrophy. Left ventricular diastolic parameters are indeterminate. Right Ventricle: The right ventricular size is mildly enlarged. Right vetricular wall thickness was not well visualized. Right ventricular systolic function is mildly reduced. There is mildly elevated pulmonary artery systolic pressure. The tricuspid regurgitant velocity is 2.89 m/s, and with an assumed right atrial pressure of 8 mmHg, the estimated right ventricular systolic pressure is 41.4 mmHg. Left Atrium: Left atrial size was severely dilated. Right Atrium: Right atrial size was normal in size. Pericardium: There is no evidence of pericardial effusion. Mitral Valve: The mitral valve is normal in structure. Mild mitral valve regurgitation. No evidence of mitral valve stenosis. Tricuspid Valve: The tricuspid valve is normal in structure. Tricuspid valve regurgitation is mild. Aortic Valve: The aortic valve is tricuspid. Aortic valve regurgitation is not visualized. Mild to moderate aortic valve  sclerosis/calcification is present, without any evidence of aortic stenosis. Pulmonic Valve: The pulmonic valve was grossly normal. Pulmonic valve regurgitation is trivial. Aorta: The aortic root is normal in size and structure. IAS/Shunts: The interatrial septum was not well visualized.  LEFT VENTRICLE PLAX 2D LVIDd:         5.50 cm LVIDs:         4.50 cm LV PW:         1.30 cm LV IVS:        1.10 cm LVOT diam:     2.10 cm LV SV:         55 LV SV Index:   30 LVOT Area:     3.46 cm  LV Volumes (MOD) LV vol d, MOD A2C: 101.0 ml LV vol d, MOD A4C: 129.0 ml LV vol s, MOD A2C: 45.7 ml LV vol s, MOD A4C: 73.7 ml LV SV MOD A2C:     55.4 ml LV SV MOD A4C:     129.0 ml LV SV MOD BP:      60.1 ml RIGHT VENTRICLE RV S prime:     6.42 cm/s TAPSE (M-mode): 1.6 cm LEFT ATRIUM             Index       RIGHT ATRIUM           Index LA diam:        6.30 cm 3.40 cm/m  RA Area:  17.50 cm LA Vol (A2C):   84.7 ml 45.70 ml/m RA Volume:   41.90 ml  22.61 ml/m LA Vol (A4C):   86.8 ml 46.83 ml/m LA Biplane Vol: 90.2 ml 48.67 ml/m  AORTIC VALVE LVOT Vmax:   71.10 cm/s LVOT Vmean:  40.600 cm/s LVOT VTI:    0.159 m  AORTA Ao Root diam: 3.50 cm Ao Asc diam:  3.40 cm MITRAL VALVE               TRICUSPID VALVE MV Area (PHT): 3.96 cm    TR Peak grad:   33.4 mmHg MV Decel Time: 192 msec    TR Vmax:        289.00 cm/s MV E velocity: 86.10 cm/s                            SHUNTS                            Systemic VTI:  0.16 m                            Systemic Diam: 2.10 cm Oswaldo Milian MD Electronically signed by Oswaldo Milian MD Signature Date/Time: 06/11/2021/12:13:35 PM    Final    CT Renal Stone Study  Result Date: 05/13/2021 CLINICAL DATA:  Hematuria, back pain EXAM: CT ABDOMEN AND PELVIS WITHOUT CONTRAST TECHNIQUE: Multidetector CT imaging of the abdomen and pelvis was performed following the standard protocol without IV contrast. COMPARISON:  01/06/2015 FINDINGS: Lower chest: Small nodule along the left major  fissure measures 4 mm. Right lower lobe nodule on the 1st image posteriorly measures 6 mm. Scarring in the lung bases. No effusions. Coronary artery and aortic calcifications. Mild cardiomegaly. Hepatobiliary: No focal hepatic abnormality. Gallbladder unremarkable. Pancreas: No focal abnormality or ductal dilatation. Spleen: No focal abnormality.  Normal size. Adrenals/Urinary Tract: There is mild right hydronephrosis. High-density material seen within the renal collecting system and proximal ureter, likely blood/hematuria. No visible obstructing stones. The right ureter gradually tapers to normal caliber in the proximal ureter. Bilateral renal vascular calcifications. Adrenal glands and urinary bladder unremarkable. Left kidney is atrophic. Stomach/Bowel: Sigmoid diverticulosis. No active diverticulitis. Stomach and small bowel decompressed, unremarkable. Vascular/Lymphatic: Diffuse aortoiliac atherosclerosis. No evidence of aneurysm or adenopathy. Reproductive: Mildly prominent prostate with central calcifications. Other: No free fluid or free air. Musculoskeletal: No acute bony abnormality. IMPRESSION: Mild right hydronephrosis with gradual tapering of the proximal right ureter. No visible cause of the hydronephrosis. High-density material seen within the right renal collecting system and proximal ureter compatible with hematuria/blood. Atrophic left kidney. Sigmoid diverticulosis.  No active diverticulitis. Bilateral lower lobe pulmonary nodules. Non-contrast chest CT at 3-6 months is recommended. If the nodules are stable at time of repeat CT, then future CT at 18-24 months (from today's scan) is considered optional for low-risk patients, but is recommended for high-risk patients. This recommendation follows the consensus statement: Guidelines for Management of Incidental Pulmonary Nodules Detected on CT Images: From the Fleischner Society 2017; Radiology 2017; 284:228-243. Electronically Signed   By: Rolm Baptise M.D.   On: 05/13/2021 18:30   VAS [place] UPPER EXTREMITY VENOUS DUPLEX  Result Date: 05/27/2021 UPPER VENOUS STUDY  Patient Name:  DORWIN FITZHENRY  Date of Exam:   05/27/2021 Medical Rec #: [id number]        Accession #:  [id number] Date of Birth: 1941/09/24        Patient Gender: M Patient Age:   54 years Exam Location:  [facility] Procedure:      VAS [place] UPPER EXTREMITY VENOUS DUPLEX Referring Phys: Shawna Clamp --------------------------------------------------------------------------------  Indications: Swelling Comparison Study: no prior Performing Technologist: Archie Patten RVS  Examination Guidelines: A complete evaluation includes B-mode imaging, spectral Doppler, color Doppler, and power Doppler as needed of all accessible portions of each vessel. Bilateral testing is considered an integral part of a complete examination. Limited examinations for reoccurring indications may be performed as noted.  Right Findings: +----------+------------+---------+-----------+----------+-------+ RIGHT     CompressiblePhasicitySpontaneousPropertiesSummary +----------+------------+---------+-----------+----------+-------+ Subclavian               Yes       Yes                      +----------+------------+---------+-----------+----------+-------+  Left Findings: +----------+------------+---------+-----------+----------+-------+ LEFT      CompressiblePhasicitySpontaneousPropertiesSummary +----------+------------+---------+-----------+----------+-------+ IJV           Full       Yes       Yes                      +----------+------------+---------+-----------+----------+-------+ Subclavian    Full       Yes       Yes                      +----------+------------+---------+-----------+----------+-------+ Axillary      Full       Yes       Yes                      +----------+------------+---------+-----------+----------+-------+ Brachial      Full       Yes       Yes                       +----------+------------+---------+-----------+----------+-------+ Radial        Full                                          +----------+------------+---------+-----------+----------+-------+ Ulnar         Full                                          +----------+------------+---------+-----------+----------+-------+ Cephalic      Full                                          +----------+------------+---------+-----------+----------+-------+ Basilic       Full                                          +----------+------------+---------+-----------+----------+-------+  Summary:  Right: No evidence of thrombosis in the subclavian.  Left: No evidence of deep vein thrombosis in the upper extremity. No evidence of superficial vein thrombosis in the upper extremity.  *See table(s) above for measurements and observations.  Diagnosing physician: Harold Barban MD Electronically signed by Harold Barban  MD on 05/27/2021 at 8:06:09 PM.    Final     Labs:  Basic Metabolic Panel: Recent Labs  Lab 06/05/21 1608 06/06/21 1327 06/09/21 1340 06/11/21 0447  NA 133* 132* 132* 135  K 3.8 3.9 3.5 3.5  CL 98 97* 98 97*  CO2 $Re'27 27 29 29  'Iaq$ GLUCOSE 98 104* 118* 91  BUN 27* 33* 13 14  CREATININE 4.41* 4.80* 2.79* 4.22*  CALCIUM 8.0* 8.2* 8.0* 8.7*  PHOS 2.8 3.2 1.6* 3.0    CBC: Recent Labs  Lab 06/08/21 0816 06/09/21 1340 06/11/21 0447  WBC 11.7* 7.9 9.5  HGB 8.5* 8.2* 8.0*  HCT 26.4* 25.8* 25.8*  MCV 96.4 97.4 98.1  PLT 226 215 244    CBG: Recent Labs  Lab 06/05/21 1201 06/05/21 1651 06/05/21 2105 06/06/21 0550 06/06/21 1156  GLUCAP 87 94 89 83 88   Family history.  Mother with leukemia and kidney disease Brother with CAD Sister with stomach cancer.  Negative colon cancer or esophageal cancer  Brief HPI:   [name] [name] is a 80 y.o. right-handed male with history of CAD/CABG atrial fibrillation on Coumadin diastolic congestive heart failure  AICD COPD remote tobacco abuse with supplemental oxygen at night CKD stage IV.  Lives with wife 1 level home independent prior to admission.  Presented 05/22/2021 with recent UTI progressive weakness and fatigue.  Denied any chest pain or shortness of breath.  In the ED blood pressure noted to be 181/59 BUN 91 creatinine 10.49 from baseline 2.5 to.  Renal ultrasound showed some left renal atrophy.  Lower pole right renal cyst with minimally complex cyst.  No hydronephrosis.  Renal services consulted for severe AKI on CKD with hemodialysis initiated.  His chronic Coumadin was resumed.  Therapy evaluations completed due to patient decreased functional mobility was admitted for a comprehensive rehab program.   Hospital Course: Tegan R Gaumer was admitted to rehab 06/05/2021 for inpatient therapies to consist of PT, ST and OT at least three hours five days a week. Past admission physiatrist, therapy team and rehab RN have worked together to provide customized collaborative inpatient rehab.  Pertain to patient's debility secondary to AKI on CKD.  Hemodialysis ongoing.  Acute on chronic anemia latest hemoglobin 8.5 with iron supplement.  History of COPD asthma he was using oxygen at nighttime as needed.  Diastolic congestive heart failure exhibited no signs of fluid overload hydralazine Entresto Aldactone were currently on hold with latest chest x-ray showing cardiomegaly mild CHF and cardiology services consulted advised continue Norvasc. Pravachol ongoing for hyperlipidemia.  He did have some mild hematuria monitoring of H&H which remained stable during work-up noted right renal pelvic mass per urology services planned outpatient evaluation.  Due to his bouts of hematuria and planned outpatient follow-up urology services to evaluate renal mass it was recommended chronic Coumadin to be placed on hold at this time until further notice   Blood pressures were monitored on TID basis and soft and monitored    Rehab  course: During patient's stay in rehab weekly team conferences were held to monitor patient's progress, set goals and discuss barriers to discharge. At admission, patient required minimal guard 80 feet rolling walker minimal assist sit to supine  Physical exam.  Blood pressure 148/52 pulse 60 temperature 98.2 respirations 16 oxygen saturations 99% room air Constitutional.  No acute distress HEENT Head.  Normocephalic and atraumatic Eyes.  Pupils round and reactive to light no discharge.nystagmus Neck.  Supple nontender no JVD without thyromegaly  Cardiac irregular irregular Abdomen.  Soft nontender positive bowel sounds without rebound Respiratory effort normal no respiratory distress without wheeze Musculoskeletal.  No edema or tenderness extremities Skin.  Warm and dry Neurologic.  Alert oriented Motor.  Bilateral upper extremities 4/5 proximal distal Right lower extremity 4/5 proximal distal Left lower extremity 4 -/5 proximal distal  He/She  has had improvement in activity tolerance, balance, postural control as well as ability to compensate for deficits. He/She has had improvement in functional use RUE/LUE  and RLE/LLE as well as improvement in awareness.  Sessions focused on functional ability transfers generalized strengthening.  Ambulates 60 feet rolling walker supervision on room air.  Completed bed mobility modified independent.  Got his belongings for activities day living homemaking.  Completed bathing at sink with overall supervision assist.  Full family teaching completed plan discharged to home       Disposition: Discharged to home    Diet: Renal diet  Special Instructions: No driving smoking or alcohol  Continue hemodialysis as directed  Continue to hold Coumadin for now due to hematuria with plan follow-up with urology services Dr. Alexis Frock for evaluation of right renal pelvic mass  Continue supplemental oxygen as prior to admission  Medications at  discharge 1.  Tylenol as needed 2.  Norvasc 10 mg p.o. daily 3.  Synthroid 112 mcg p.o. daily 4.  Rena-Vite 1 tablet daily 5.  Protonix 40 mg p.o. twice daily 6.  Pravachol 40 mg p.o. daily 7.  Continue nebulizer treatments as prior to admission.  30-35 minutes were spent completing discharge summary and discharge planning Discharge Instructions     Ambulatory referral to Physical Medicine Rehab   Complete by: As directed    Moderate complexity follow up 1-2 weeks Debility/Multi medical        Follow-up Information     Raulkar, Clide Deutscher, MD Follow up.   Specialty: Physical Medicine and Rehabilitation Why: Office to call for appointment Contact information: [address] [place] [address] [phone]         Rosita Fire, MD Follow up.   Specialties: Nephrology, Internal Medicine Why: Call for appointment Contact information: [place] [address] [phone]         Alexis Frock, MD Follow up.   Specialty: Urology Why: Call for appointment for evaluation of right renal/pelvic mass Contact information: [place] [address] [phone]         Larey Dresser, MD Follow up.   Specialty: Cardiology Why: call for appointment Contact information: [address] [place] [address] [phone]                 Signed: Lavon Paganini [place] 06/12/2021, 5:18 AM

## 2021-06-10 NOTE — Progress Notes (Signed)
PROGRESS NOTE   Subjective/Complaints: [name] [name] feels very fatigued- he prefers to go home now. Encouraged him to participate as well as he can tolerate and then we can consider moving his discharge date up sooner. He wants to see his great grandkids. Apprecaite cardiology, urology, and nephrology following.    ROS:  Pt denies abd pain, CP, N/V/C/D, and vision changes    Objective:   No results found. Recent Labs    06/08/21 0816 06/09/21 1340  WBC 11.7* 7.9  HGB 8.5* 8.2*  HCT 26.4* 25.8*  PLT 226 215   Recent Labs    06/09/21 1340  NA 132*  K 3.5  CL 98  CO2 29  GLUCOSE 118*  BUN 13  CREATININE 2.79*  CALCIUM 8.0*    Intake/Output Summary (Last 24 hours) at 06/10/2021 0949 Last data filed at 06/09/2021 1545 Gross per 24 hour  Intake --  Output 2000 ml  Net -2000 ml        Physical Exam: Vital Signs Blood pressure (!) 143/52, pulse (!) 53, temperature 98.3 F (36.8 C), resp. rate 18, height 5\' 7"  (1.702 m), weight 75 kg, SpO2 100 %. Gen: no distress, normal appearing HEENT: oral mucosa pink and moist, NCAT Cardio: Bradycardia Pulmonary:     Effort: Pulmonary effort is normal.     Breath sounds: Normal breath sounds.     Comments: + [place], +wheezing Abdominal:     General: Abdomen is flat. There is no distension.     Comments: Bowel sounds hypoactive  Musculoskeletal:     Cervical back: Normal range of motion and neck supple.     Comments: No edema or tenderness in extremities  Skin:    General: Skin is warm and dry.  Neurological:     Mental Status: He is alert.     Comments: Alert and oriented Makes eye contact with examiner. Motor: Bilateral upper extremities: 4/5 proximal distal Right lower extremity: 4/5 proximal distal Left lower extremity: 4-/5 proximal distal  Psychiatric:        Mood and Affect: Mood normal.        Behavior: Behavior normal.  GU: Blood in  urinal   Assessment/Plan: 1. Functional deficits which require 3+ hours per day of interdisciplinary therapy in a comprehensive inpatient rehab setting. Physiatrist is providing close team supervision and 24 hour management of active medical problems listed below. Physiatrist and rehab team continue to assess barriers to discharge/monitor patient progress toward functional and medical goals  Care Tool:  Bathing  Bathing activity did not occur: Refused Body parts bathed by patient: Front perineal area, Buttocks         Bathing assist Assist Level: Set up assist     Upper Body Dressing/Undressing Upper body dressing   What is the patient wearing?: Pull over shirt    Upper body assist Assist Level: Set up assist    Lower Body Dressing/Undressing Lower body dressing      What is the patient wearing?: Pants, Underwear/pull up     Lower body assist Assist for lower body dressing: Set up assist     Toileting Toileting Toileting Activity did not occur Landscape architect and hygiene  only): Refused  Toileting assist Assist for toileting: Contact Guard/Touching assist Assistive Device Comment: urinal   Transfers Chair/bed transfer  Transfers assist     Chair/bed transfer assist level: Supervision/Verbal cueing     Locomotion Ambulation   Ambulation assist      Assist level: Supervision/Verbal cueing Assistive device: Walker-rolling Max distance: 20ft   Walk 10 feet activity   Assist     Assist level: Supervision/Verbal cueing Assistive device: Walker-rolling   Walk 50 feet activity   Assist Walk 50 feet with 2 turns activity did not occur: Safety/medical concerns  Assist level: Supervision/Verbal cueing Assistive device: Walker-rolling    Walk 150 feet activity   Assist Walk 150 feet activity did not occur: Safety/medical concerns         Walk 10 feet on uneven surface  activity   Assist Walk 10 feet on uneven surfaces activity did  not occur: Safety/medical concerns         Wheelchair     Assist Is the patient using a wheelchair?: Yes      Wheelchair assist level: Supervision/Verbal cueing Max wheelchair distance: 50    Wheelchair 50 feet with 2 turns activity    Assist        Assist Level: Supervision/Verbal cueing   Wheelchair 150 feet activity     Assist      Assist Level: Moderate Assistance - Patient 50 - 74%   Blood pressure (!) 143/52, pulse (!) 53, temperature 98.3 F (36.8 C), resp. rate 18, height 5\' 7"  (1.702 m), weight 75 kg, SpO2 100 %.    Medical Problem List and Plan: 1.   Debility secondary to AKI on CKD/multi medical             -patient may not shower             -ELOS/Goals: 4-6 days/supervision/mod I            Continue CIR/PT and OT- as tolerated today- feels "really bad" 2.  Antithrombotics: -DVT/anticoagulation:  Pharmaceutical: Coumadin             -antiplatelet therapy: N/A 3. Pain Management: Tylenol as needed 4. Mood: Provide emotional support             -antipsychotic agents: N/A 5. Neuropsych: This patient is capable of making decisions on his own behalf. 6. Skin/Wound Care: Routine skin checks 7. Fluids/Electrolytes/Nutrition: Routine in and outs 8.  AKI on CKD.  Hemodialysis initiated.  Follow-up nephrology services             Labs with HD  9/5- spoke with renal- will do HD today as well as scheduled T/H/S due to CHF exacerbation. .  9.  Acute on chronic anemia.  Patient did receive a total of 3 units packed red blood cells.  Continue iron supplement  9/5- Hb 8.5- stable- con't to monitor             CBC ordered 10.  History of COPD/asthma.  Chronically on 2.5-3 L oxygen nightly. Ordered incentive spirometer to improve breathing strength and ability over time. 11.  Diastolic congestive heart failure.  Monitor for any signs of fluid overload.  Currently on amlodipine, continue  Hydralazine Entresto and Aldactone currently on hold  9/5- having CHF  exacerbation based on CXR results today- (ordered 2 view CXR)- having HD again today per renal. 12.  Atrial fibrillation.  Continue Coumadin.  Cardiac rate controlled. INR 1.9 on 9/7 13.  Hyperlipidemia.  Pravachol 14.  GERD.  Continue Protonix 15.  Hypothyroidism: Continue Synthroid 16. Overweight BMI 27.97: provide dietary education.  17. Bradycardia: discontinue Coreg 18. Hematuria  9/5- has pelvic mass- could be cause- has restarted as of last night- Hb stable- so will con't to monitor- and call Urology if gets worse- mainly clots- U/A and Cx ordered per nursing requirements due to clots.  9/6: appreciate renal and cardiology input- continue warfarin despite hematuria  9/7: plan for outpatient eval of pelvic mass.   LOS: 5 days A FACE TO FACE EVALUATION WAS PERFORMED  Clide Deutscher Emmelyn Schmale 06/10/2021, 9:49 AM

## 2021-06-10 NOTE — Progress Notes (Signed)
[facility] NEPHROLOGY PROGRESS NOTE  Assessment/ Plan:  # AKI/CKD stage IV, oliguric - presumably due to ischemic ATN due to volume depletion and concomitant use of ARB, aldactone, and jardiance.  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  Started on HD 05/28/21 with temp HD catheter. S/p TDC placement 06/02/21 w/ IR. Continue on TTS schedule for now and follow for renal recovery. No signs of renal recovery so far.  Outpatient HD arranged at [facility], TTS with schedule chair time 12:15 PM, to start from 9/13.   He received dialysis last 2 days consecutively and tolerated well with ultrafiltration.  Plan for next HD tomorrow.  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  I recommend to consult cardiology about reassessment of Coumadin use especially with ongoing hematuria.  Monitor hemoglobin.    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  No ESA due to pelvic mass.  #A fib - on coumadin and carvedilol.  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  Not on binders.  I will check PTH level.  May benefit from vitamin D.  Encourage oral intake.  #COPD: Continue bronchodilators.  Volume management as above.  Subjective: Seen and examined at bedside.  He had dialysis yesterday with 2 L ultrafiltration.  Tolerated well.  No new event.  Objective Vital signs in last 24 hours: Vitals:   06/09/21 1800 06/09/21 2021 06/10/21 0500 06/10/21 0503  BP: (!) 127/51 (!) 156/57  (!) 143/52  Pulse: 60 (!) 58  (!) 53  Resp: 17 18  18   Temp: 98.1 F (36.7 C) 98.1 F (36.7 C)  98.3 F (36.8 C)  TempSrc: Oral     SpO2: 100% 100%  100%  Weight:   75 kg   Height:       Weight change: -5 kg  Intake/Output Summary (Last 24 hours) at 06/10/2021 1011 Last data filed at 06/09/2021 1545 Gross per 24 hour  Intake --  Output 2000 ml  Net -2000 ml         Labs: Basic Metabolic Panel: Recent Labs  Lab 06/05/21 1608 06/06/21 1327 06/09/21 1340  NA 133* 132* 132*  K 3.8 3.9 3.5  CL 98 97* 98  CO2 27 27 29   GLUCOSE 98 104* 118*  BUN 27* 33* 13  CREATININE 4.41* 4.80* 2.79*  CALCIUM 8.0* 8.2* 8.0*  PHOS 2.8 3.2 1.6*    Liver Function Tests: Recent Labs  Lab 06/05/21 1608 06/06/21 1327 06/09/21 1340  ALBUMIN 2.3* 2.4* 2.4*    No results for input(s): LIPASE, AMYLASE in the last 168 hours. No results for input(s): AMMONIA in the last 168 hours. CBC: Recent Labs  Lab 06/04/21 0353 06/05/21 1608 06/06/21 1327 06/08/21 0816 06/09/21 1340  WBC 8.8 9.0 10.4 11.7* 7.9  NEUTROABS 6.5  --   --   --   --   HGB 8.1* 8.7* 8.7* 8.5* 8.2*  HCT 25.2* 26.8* 27.5* 26.4* 25.8*  MCV 96.6 95.7 97.2 96.4 97.4  PLT 241 237 249 226 215    Cardiac Enzymes: No results for input(s): CKTOTAL, CKMB, CKMBINDEX, TROPONINI in the last 168 hours. CBG: Recent Labs  Lab 06/05/21 1201 06/05/21 1651 06/05/21 2105 06/06/21 0550 06/06/21 1156  GLUCAP 87 94 89 83 88     Iron Studies: No results for input(s): IRON, TIBC, TRANSFERRIN, FERRITIN in the last 72 hours. Studies/Results:  No results found.  Medications: Infusions:   Scheduled Medications:  amLODipine  10 mg Oral Daily   budesonide  0.25 mg Nebulization BID   Chlorhexidine Gluconate Cloth  6 each Topical Q0600   Chlorhexidine Gluconate Cloth  6 each Topical Q0600   feeding supplement (NEPRO CARB STEADY)  237 mL Oral TID BM   ipratropium-albuterol  3 mL Nebulization BID   levothyroxine  112 mcg Oral Q0600   multivitamin  1 tablet Oral QHS   pantoprazole  40 mg Oral BID   pravastatin  40 mg Oral q1800   warfarin  1.5 mg Oral q1600   Warfarin - Pharmacist Dosing Inpatient   Does not apply q1600    have reviewed scheduled and prn medications.  Physical Exam: General:NAD, able to lie flat, comfortable Heart:RRR, s1s2 nl Lungs: Clear anteriorly, no  wheezing Abdomen:soft, Non-tender, non-distended Extremities:No edema Dialysis Access: IJ TDC in place  Dakari Cregger [employer] 06/10/2021,10:11 AM  LOS: 5 days

## 2021-06-11 ENCOUNTER — Inpatient Hospital Stay (HOSPITAL_COMMUNITY): Payer: Medicare Other

## 2021-06-11 DIAGNOSIS — I5021 Acute systolic (congestive) heart failure: Secondary | ICD-10-CM

## 2021-06-11 LAB — RENAL FUNCTION PANEL
Albumin: 2.4 g/dL — ABNORMAL LOW (ref 3.5–5.0)
Anion gap: 9 (ref 5–15)
BUN: 14 mg/dL (ref 8–23)
CO2: 29 mmol/L (ref 22–32)
Calcium: 8.7 mg/dL — ABNORMAL LOW (ref 8.9–10.3)
Chloride: 97 mmol/L — ABNORMAL LOW (ref 98–111)
Creatinine, Ser: 4.22 mg/dL — ABNORMAL HIGH (ref 0.61–1.24)
GFR, Estimated: 13 mL/min — ABNORMAL LOW (ref >60)
Glucose, Bld: 91 mg/dL (ref 70–99)
Phosphorus: 3 mg/dL (ref 2.5–4.6)
Potassium: 3.5 mmol/L (ref 3.5–5.1)
Sodium: 135 mmol/L (ref 135–145)

## 2021-06-11 LAB — ECHOCARDIOGRAM COMPLETE
Area-P 1/2: 3.96 cm2
Calc EF: 50.7 %
Height: 67 in
S' Lateral: 4.5 cm
Single Plane A2C EF: 54.8 %
Single Plane A4C EF: 42.9 %
Weight: 2606.72 oz

## 2021-06-11 LAB — CBC
HCT: 25.8 % — ABNORMAL LOW (ref 39.0–52.0)
Hemoglobin: 8 g/dL — ABNORMAL LOW (ref 13.0–17.0)
MCH: 30.4 pg (ref 26.0–34.0)
MCHC: 31 g/dL (ref 30.0–36.0)
MCV: 98.1 fL (ref 80.0–100.0)
Platelets: 244 10*3/uL (ref 150–400)
RBC: 2.63 MIL/uL — ABNORMAL LOW (ref 4.22–5.81)
RDW: 14.2 % (ref 11.5–15.5)
WBC: 9.5 10*3/uL (ref 4.0–10.5)
nRBC: 0 % (ref 0.0–0.2)

## 2021-06-11 NOTE — Progress Notes (Signed)
Occupational Therapy Session Note  Patient Details  Name: CRU KRITIKOS MRN: [id number] Date of Birth: Feb 15, 1941  Today's Date: 06/11/2021 OT Individual Time: 0803-0900 OT Individual Time Calculation (min): 57 min    Short Term Goals: Week 1:  OT Short Term Goal 1 (Week 1): STGs = LTGs  Skilled Therapeutic Interventions/Progress Updates:  Pt greeted supine in bed reporting fatigue but agreeable to OT intervention. Session focus on BADL reeducation and functional mobility in ADL apartment. Pt completed bed mobility MOD I with use of bed features. Pt completed functional mobility to sink with no AD and CGA for seated grooming tasks at sink with supervision. Pt completed UB dressing from w/c leve with set- up assist, pt declined LB dressing, set- up assist to don shoes from w/c. Pt transported to ADL apartment with total A for time mgmt. Pt completed stand pivot transfer from w/c>EOB with RW and supervision. Pt completed bed mobility independently to flat HOB. Pt completed stand pivot transfer from w/c>recliner with Rw and Supervision. Pt completed stand pivot transfer to walkin shower with RW and CGA for safety. Pt transported back to room with total A where  pt left up in w/c with safety belt activated and all needs within reach.                        Therapy Documentation Precautions:  Precautions Precautions: Fall Restrictions Weight Bearing Restrictions: No  Pain: pt reports no pain during session.     Therapy/Group: Individual Therapy  Corinne Ports [facility] 06/11/2021, 12:04 PM

## 2021-06-11 NOTE — Progress Notes (Signed)
PROGRESS NOTE   Subjective/Complaints: Discussed with patient and wife urology plan- they were unaware of pelvic mass and plan of treatment. Patient allows wife to help make decisions for him Discussed that from rehab perspective he is ready for d/c Saturday after HD- trying to get him earliest slot   ROS:  Pt denies abd pain, CP, N/V/C/D, and vision changes    Objective:   ECHOCARDIOGRAM COMPLETE  Result Date: 06/11/2021    ECHOCARDIOGRAM REPORT   Patient Name:   [name] [name] Date of Exam: 06/11/2021 Medical Rec #:  [id number]       Height:       67.0 in Accession #:    [id number]      Weight:       162.9 lb Date of Birth:  [birth year]       BSA:          1.853 m Patient Age:    80 years        BP:           151/49 mmHg Patient Gender: M               HR:           61 bpm. Exam Location:  Inpatient Procedure: 2D Echo, Color Doppler and Cardiac Doppler Indications:    N27.78 Acute systolic (congestive) heart failure  History:        Patient has prior history of Echocardiogram examinations, most                 recent 11/04/2020. CHF, Defibrillator, COPD, Arrythmias:Atrial                 Fibrillation; Risk Factors:Hypertension and Dyslipidemia.  Sonographer:    Darlina Sicilian RDCS Referring Phys: [place]  1. Left ventricular ejection fraction, by estimation, is 50 to 55%. The left ventricle has low normal function. The left ventricle has no regional wall motion abnormalities. There is moderate left ventricular hypertrophy. Left ventricular diastolic parameters are indeterminate.  2. Right ventricular systolic function is mildly reduced. The right ventricular size is mildly enlarged. There is mildly elevated pulmonary artery systolic pressure.  3. Left atrial size was severely dilated.  4. The mitral valve is normal in structure. Mild mitral valve regurgitation. No evidence of mitral stenosis.  5. The aortic valve is  tricuspid. Aortic valve regurgitation is not visualized. Mild to moderate aortic valve sclerosis/calcification is present, without any evidence of aortic stenosis. FINDINGS  Left Ventricle: Left ventricular ejection fraction, by estimation, is 50 to 55%. The left ventricle has low normal function. The left ventricle has no regional wall motion abnormalities. The left ventricular internal cavity size was normal in size. There is moderate left ventricular hypertrophy. Left ventricular diastolic parameters are indeterminate. Right Ventricle: The right ventricular size is mildly enlarged. Right vetricular wall thickness was not well visualized. Right ventricular systolic function is mildly reduced. There is mildly elevated pulmonary artery systolic pressure. The tricuspid regurgitant velocity is 2.89 m/s, and with an assumed right atrial pressure of 8 mmHg, the estimated right ventricular systolic pressure is 24.2 mmHg. Left Atrium: Left atrial size was severely  dilated. Right Atrium: Right atrial size was normal in size. Pericardium: There is no evidence of pericardial effusion. Mitral Valve: The mitral valve is normal in structure. Mild mitral valve regurgitation. No evidence of mitral valve stenosis. Tricuspid Valve: The tricuspid valve is normal in structure. Tricuspid valve regurgitation is mild. Aortic Valve: The aortic valve is tricuspid. Aortic valve regurgitation is not visualized. Mild to moderate aortic valve sclerosis/calcification is present, without any evidence of aortic stenosis. Pulmonic Valve: The pulmonic valve was grossly normal. Pulmonic valve regurgitation is trivial. Aorta: The aortic root is normal in size and structure. IAS/Shunts: The interatrial septum was not well visualized.  LEFT VENTRICLE PLAX 2D LVIDd:         5.50 cm LVIDs:         4.50 cm LV PW:         1.30 cm LV IVS:        1.10 cm LVOT diam:     2.10 cm LV SV:         55 LV SV Index:   30 LVOT Area:     3.46 cm  LV Volumes (MOD) LV  vol d, MOD A2C: 101.0 ml LV vol d, MOD A4C: 129.0 ml LV vol s, MOD A2C: 45.7 ml LV vol s, MOD A4C: 73.7 ml LV SV MOD A2C:     55.4 ml LV SV MOD A4C:     129.0 ml LV SV MOD BP:      60.1 ml RIGHT VENTRICLE RV S prime:     6.42 cm/s TAPSE (M-mode): 1.6 cm LEFT ATRIUM             Index       RIGHT ATRIUM           Index LA diam:        6.30 cm 3.40 cm/m  RA Area:     17.50 cm LA Vol (A2C):   84.7 ml 45.70 ml/m RA Volume:   41.90 ml  22.61 ml/m LA Vol (A4C):   86.8 ml 46.83 ml/m LA Biplane Vol: 90.2 ml 48.67 ml/m  AORTIC VALVE LVOT Vmax:   71.10 cm/s LVOT Vmean:  40.600 cm/s LVOT VTI:    0.159 m  AORTA Ao Root diam: 3.50 cm Ao Asc diam:  3.40 cm MITRAL VALVE               TRICUSPID VALVE MV Area (PHT): 3.96 cm    TR Peak grad:   33.4 mmHg MV Decel Time: 192 msec    TR Vmax:        289.00 cm/s MV E velocity: 86.10 cm/s                            SHUNTS                            Systemic VTI:  0.16 m                            Systemic Diam: 2.10 cm Oswaldo Milian MD Electronically signed by Oswaldo Milian MD Signature Date/Time: 06/11/2021/12:13:35 PM    Final    Recent Labs    06/09/21 1340 06/11/21 0447  WBC 7.9 9.5  HGB 8.2* 8.0*  HCT 25.8* 25.8*  PLT 215 244   Recent Labs    06/09/21 1340 06/11/21 0447  NA 132* 135  K 3.5 3.5  CL 98 97*  CO2 29 29  GLUCOSE 118* 91  BUN 13 14  CREATININE 2.79* 4.22*  CALCIUM 8.0* 8.7*    Intake/Output Summary (Last 24 hours) at 06/11/2021 1313 Last data filed at 06/11/2021 0500 Gross per 24 hour  Intake 200 ml  Output 100 ml  Net 100 ml        Physical Exam: Vital Signs Blood pressure (!) 151/49, pulse 60, temperature 98.6 F (37 C), temperature source Oral, resp. rate 20, height 5\' 7"  (1.702 m), weight 73.9 kg, SpO2 99 %. Gen: no distress, normal appearing HEENT: oral mucosa pink and moist, NCAT Cardio: Bradycardia Pulmonary:     Effort: Pulmonary effort is normal.     Breath sounds: Normal breath sounds.     Comments: + [place],  +wheezing, mild dyspnea on exertion Abdominal:     General: Abdomen is flat. There is no distension.     Comments: Bowel sounds hypoactive  Musculoskeletal:     Cervical back: Normal range of motion and neck supple.     Comments: No edema or tenderness in extremities  Skin:    General: Skin is warm and dry.  Neurological:     Mental Status: He is alert.     Comments: Alert and oriented Makes eye contact with examiner. Motor: Bilateral upper extremities: 4/5 proximal distal Right lower extremity: 4/5 proximal distal Left lower extremity: 4-/5 proximal distal  Psychiatric:        Mood and Affect: Mood normal.        Behavior: Behavior normal.  GU: Blood in urinal   Assessment/Plan: 1. Functional deficits which require 3+ hours per day of interdisciplinary therapy in a comprehensive inpatient rehab setting. Physiatrist is providing close team supervision and 24 hour management of active medical problems listed below. Physiatrist and rehab team continue to assess barriers to discharge/monitor patient progress toward functional and medical goals  Care Tool:  Bathing  Bathing activity did not occur: Refused Body parts bathed by patient: Front perineal area, Buttocks         Bathing assist Assist Level: Set up assist     Upper Body Dressing/Undressing Upper body dressing   What is the patient wearing?: Pull over shirt    Upper body assist Assist Level: Set up assist    Lower Body Dressing/Undressing Lower body dressing      What is the patient wearing?: Pants, Underwear/pull up     Lower body assist Assist for lower body dressing: Set up assist     Toileting Toileting Toileting Activity did not occur (Clothing management and hygiene only): Refused  Toileting assist Assist for toileting: Contact Guard/Touching assist Assistive Device Comment: urinal   Transfers Chair/bed transfer  Transfers assist     Chair/bed transfer assist level: Supervision/Verbal  cueing     Locomotion Ambulation   Ambulation assist      Assist level: Supervision/Verbal cueing Assistive device: Walker-rolling Max distance: 162ft   Walk 10 feet activity   Assist     Assist level: Supervision/Verbal cueing Assistive device: Walker-rolling   Walk 50 feet activity   Assist Walk 50 feet with 2 turns activity did not occur: Safety/medical concerns  Assist level: Supervision/Verbal cueing Assistive device: Walker-rolling    Walk 150 feet activity   Assist Walk 150 feet activity did not occur: Safety/medical concerns  Assist level: Supervision/Verbal cueing Assistive device: Walker-rolling    Walk 10 feet on uneven surface  activity  Assist Walk 10 feet on uneven surfaces activity did not occur: Safety/medical concerns         Wheelchair     Assist Is the patient using a wheelchair?: Yes      Wheelchair assist level: Supervision/Verbal cueing Max wheelchair distance: 50    Wheelchair 50 feet with 2 turns activity    Assist        Assist Level: Supervision/Verbal cueing   Wheelchair 150 feet activity     Assist      Assist Level: Moderate Assistance - Patient 50 - 74%   Blood pressure (!) 151/49, pulse 60, temperature 98.6 F (37 C), temperature source Oral, resp. rate 20, height 5\' 7"  (1.702 m), weight 73.9 kg, SpO2 99 %.    Medical Problem List and Plan: 1.   Debility secondary to AKI on CKD/multi medical             -patient may not shower             -ELOS/Goals: 4-6 days/supervision/mod I            Continue CIR/PT and OT- as tolerated today- feels "really bad" 2.  Antithrombotics: -DVT/anticoagulation:  Pharmaceutical: Coumadin             -antiplatelet therapy: N/A 3. Pain Management: Tylenol as needed 4. Mood: Provide emotional support             -antipsychotic agents: N/A 5. Neuropsych: This patient is capable of making decisions on his own behalf. 6. Skin/Wound Care: Routine skin  checks 7. Fluids/Electrolytes/Nutrition: Routine in and outs 8.  AKI on CKD.  Hemodialysis initiated.  Follow-up nephrology services             Labs with HD  D/c after Saturday HD- please try to get on earliest schedule 9.  Acute on chronic anemia.  Patient did receive a total of 3 units packed red blood cells.  Continue iron supplement  9/5- Hb 8.5- stable- con't to monitor             CBC ordered 10.  History of COPD/asthma.  Chronically on 2.5-3 L oxygen nightly. Ordered incentive spirometer to improve breathing strength and ability over time. 11.  Diastolic congestive heart failure.  Monitor for any signs of fluid overload.  Currently on amlodipine, continue  Hydralazine Entresto and Aldactone currently on hold  9/5- having CHF exacerbation based on CXR results today- (ordered 2 view CXR)- having HD again today per renal. 12.  Atrial fibrillation.  Continue Coumadin.  Cardiac rate controlled. INR 1.9 on 9/7 13.  Hyperlipidemia.  Pravachol 14.  GERD.  Continue Protonix 15.  Hypothyroidism: Continue Synthroid 16. Overweight BMI 27.97: provide dietary education.  17. Bradycardia: coreg resumed by cardiology 18. Hematuria  9/5- has pelvic mass- could be cause- has restarted as of last night- Hb stable- so will con't to monitor- and call Urology if gets worse- mainly clots- U/A and Cx ordered per nursing requirements due to clots.  9/6: appreciate renal and cardiology input- continue warfarin despite hematuria  9/7: plan for outpatient eval of pelvic mass. Discussed this plan with patient and wife- they were unaware and patient is not sure if he wants to proceed with evaluation- would like to discuss risks and benefits with urology first   LOS: 6 days A FACE TO FACE EVALUATION WAS [place] 06/11/2021, 1:13 PM

## 2021-06-11 NOTE — Progress Notes (Signed)
Appreciate cardiology service follow-up.  Recommendations at this time are to hold Coumadin until further notice for evaluation of hematuria outpatient by urology services Dr. Rocky Crafts

## 2021-06-11 NOTE — Progress Notes (Signed)
Physical Therapy Session Note  Patient Details  Name: [name] [name] MRN: [id number] Date of Birth: Jun 25, 1941  Today's Date: 06/11/2021 PT Individual Time: 0929-5747 PT Individual Time Calculation (min): 38 min  Today's Date: 06/11/2021 PT Missed Time: 22 Minutes Missed Time Reason: Other (Comment) (Echo)  Short Term Goals: Week 1:  PT Short Term Goal 1 (Week 1): STG=LTG due to ELOS  Skilled Therapeutic Interventions/Progress Updates:   Received pt sitting in WC finishing OT session. Pt immediately requested to return to bed but therapist encouraged pt to complete session then return to bed. Specialist arrived and asked therapist to return 20 minutes later to perform Echo. Upon returning, pt ultimately agreeable to PT session and denied any pain just reported "weakness". Pt on 2L O2 via [place] on rest with O2 sat 98% but pt continues to report SOB. Session with emphasis on functional mobility/transfers, generalized strengthening, dynamic standing balance/coordination, gait training, stair navigation, and improved activity tolerance. Pt transported to main therapy gym in [facility] total A for time management purposes and navigated 8 steps with L handrail and CGA for first 4 steps and close supervision for last 4 steps with cues for lateral stepping technique and ascending and descending with a step to pattern. O2 93% increasing to 98% with seated rest break. Pt ambulated 18ft with RW and supervision with total A to manage O2 tank. Pt demonstrates flexed trunk/downward gaze, and decreased cadence with decreased bilateral foot clearance. Pt able to stand and pick up small cup from floor using RW and supervision with no LOB noted. Pt requested to return to room and therapist encouraged pt to walk. With encouragement pt ambulated additional 158ft with RW and supervision back to room and transferred sit<>supine mod I using bedrails. Concluded session with pt supine in bed, needs within reach, and bed alarm on.  Provided pt with numerous blankets and heat packs due to feeling cold. Pt left on 2.5L O2 via [place] per pt request. 22 minutes missed of skilled physical therapy due to Echo test.   Therapy Documentation Precautions:  Precautions Precautions: Fall Restrictions Weight Bearing Restrictions: No  Therapy/Group: Individual Therapy Alfonse Alpers PT, DPT   06/11/2021, 7:19 AM

## 2021-06-11 NOTE — Progress Notes (Signed)
Physical Therapy Session Note  Patient Details  Name: [name] [name] MRN: [id number] Date of Birth: [birth year]  Today's Date: 06/11/2021 PT Individual Time: 1132-1200 PT Individual Time Calculation (min): 28 min   Short Term Goals: Week 1:  PT Short Term Goal 1 (Week 1): STG=LTG due to ELOS  Skilled Therapeutic Interventions/Progress Updates:    Patient in supine with wife in the room.  Reports thought he was done with therapies, but participated with some encouragement.  Patient S for supine to sit with increased time and effort.  Donned shoes at EOB with set up.  Performed sit to stand with S to RW and ambulated x 110' to ADL apartment with S to [place] to sit in recliner.  Seated rest then up from recliner with S to RW and CGA to kitchen performed side stepping with 1-2 hand support on counter with mod cues for technique with feet facing forward.  Performed heel raises x 10 cues for knees straight.  Returned to recliner for seated rest.  Patient again up with S to RW and ambulated 110' to room.  Sit to supine mod I then performed supine therex including SLR, bridging, hip adductor squeezes, hip abduction hooklying with green t-band and partial sit ups all x 10-15 reps.  Patient left supine with wife in the room and needs in reach.  RN aware pt requesting breathing treatment.   Therapy Documentation Precautions:  Precautions Precautions: Fall Restrictions Weight Bearing Restrictions: No  Pain: Pain Assessment Pain Score: 0-No pain   Therapy/Group: Individual Therapy  [place], [place] 06/11/2021, 4:02 PM

## 2021-06-11 NOTE — Progress Notes (Signed)
Progress Note  Patient Name: [name] [name] Date of Encounter: 06/11/2021  [facility] Cardiologist: Dr Smith/Dr Aundra Dubin  Subjective   No CP; mild DOE  Inpatient Medications    Scheduled Meds:  amLODipine  10 mg Oral Daily   budesonide  0.25 mg Nebulization BID   Chlorhexidine Gluconate Cloth  6 each Topical Q0600   Chlorhexidine Gluconate Cloth  6 each Topical Q0600   Chlorhexidine Gluconate Cloth  6 each Topical Q0600   feeding supplement (NEPRO CARB STEADY)  237 mL Oral TID BM   ipratropium-albuterol  3 mL Nebulization BID   levothyroxine  112 mcg Oral Q0600   multivitamin  1 tablet Oral QHS   pantoprazole  40 mg Oral BID   pravastatin  40 mg Oral q1800   Continuous Infusions:  PRN Meds: acetaminophen, albuterol, dextromethorphan-guaiFENesin, senna-docusate, traZODone   Vital Signs    Vitals:   06/10/21 0503 06/10/21 1532 06/10/21 1944 06/11/21 0457  BP: (!) 143/52 (!) 134/56 (!) 144/58 (!) 151/49  Pulse: (!) 53 (!) 56 (!) 59 60  Resp: 18 17 14 20   Temp: 98.3 F (36.8 C) 97.7 F (36.5 C) 99.3 F (37.4 C) 98.6 F (37 C)  TempSrc:  Oral Oral Oral  SpO2: 100% 100% 100% 99%  Weight:    73.9 kg  Height:        Intake/Output Summary (Last 24 hours) at 06/11/2021 1024 Last data filed at 06/11/2021 0500 Gross per 24 hour  Intake 440 ml  Output 400 ml  Net 40 ml   Last 3 Weights 06/11/2021 06/10/2021 06/09/2021  Weight (lbs) 162 lb 14.7 oz 165 lb 5.5 oz 162 lb 11.2 oz  Weight (kg) 73.9 kg 75 kg 73.8 kg      Physical Exam   GEN: No acute distress.   Neck: No JVD Cardiac: RRR, no murmurs, rubs, or gallops.  Respiratory: Clear to auscultation bilaterally. GI: Soft, nontender, non-distended  MS: No edema Neuro:  Nonfocal  Psych: Normal affect   Labs      Chemistry Recent Labs  Lab 06/06/21 1327 06/09/21 1340 06/11/21 0447  NA 132* 132* 135  K 3.9 3.5 3.5  CL 97* 98 97*  CO2 27 29 29   GLUCOSE 104* 118* 91  BUN 33* 13 14  CREATININE 4.80* 2.79*  4.22*  CALCIUM 8.2* 8.0* 8.7*  ALBUMIN 2.4* 2.4* 2.4*  GFRNONAA 12* 22* 13*  ANIONGAP 8 5 9      Hematology Recent Labs  Lab 06/08/21 0816 06/09/21 1340 06/11/21 0447  WBC 11.7* 7.9 9.5  RBC 2.74* 2.65* 2.63*  HGB 8.5* 8.2* 8.0*  HCT 26.4* 25.8* 25.8*  MCV 96.4 97.4 98.1  MCH 31.0 30.9 30.4  MCHC 32.2 31.8 31.0  RDW 13.9 13.9 14.2  PLT 226 215 244    Patient Profile     80 year old male with past medical history of coronary artery disease status post coronary artery bypass and graft, chronic combined systolic/diastolic congestive heart failure (previously noted to have reduced LV function but most recent echocardiogram January 2022 showed normal LV function, moderate left ventricular hypertrophy, biatrial enlargement, mild mitral regurgitation), prior ICD, permanent atrial fibrillation; admitted with UTI and acute renal failure with creatinine 10.49 and BUN 91, hgb 7.6. Patient was initiated on hemodialysis.  He was also noted to have hematuria and was assessed by urology.  Cardiology now asked to evaluate for medication management for history of heart failure and also need for continued Coumadin in the setting of hematuria.  Assessment & Plan    1 permanent atrial fibrillation-as outlined previously patient's CHA2DS2-VASc is 5.  He would benefit from anticoagulation long-term.  However he is having gross hematuria.  Coumadin is therefore on hold for now.  Can resume as an outpatient once hematuria improves.  He is scheduled to see Dr. Aundra Dubin in the near future.  Patient understands there is a risk of embolic event off of Coumadin but given active bleeding there is no other alternative at this point.    2 history of cardiomyopathy-await follow-up echocardiogram.  If LV function remains normal we will continue with present medications including amlodipine.  If reduced we will discontinue amlodipine and instead treat with hydralazine/nitrates and add low-dose carvedilol.  He is not a  candidate for ARB or Entresto in the setting of acute renal failure (patient is being dialyzed but there is some optimism that his renal function may improve and not require dialysis in the future).   3 acute on chronic kidney disease-patient is now on dialysis which is being managed by nephrology.   4 chronic combined systolic/diastolic congestive heart failure-patient appears to be euvolemic on exam.  Volume management per dialysis.   5 coronary artery disease-continue statin.   6 Prior ICD   7 hypertension-follow blood pressure closely as we will likely need to decrease antihypertensive regimen as he continues with dialysis.  8 hematuria-follow-up urology.  Patient can be discharged from a cardiac standpoint.  Follow-up with Dr. Aundra Dubin as scheduled.  For questions or updates, please contact [place] Please consult [email or web address] for contact info under        Signed, Kirk Ruths, MD  06/11/2021, 10:24 AM

## 2021-06-11 NOTE — Progress Notes (Signed)
Patient ID: [name] [name], male   DOB: December 20, 1940, 80 y.o.   MRN: [id number] [name] [name] has accepted his referral for [place] follow up. Will make pt and wife aware.

## 2021-06-11 NOTE — Progress Notes (Signed)
Inpatient Rehabilitation Care Coordinator Discharge Note-SAT 9/10  Patient Details  Name: [name] [name] MRN: [id number] Date of Birth: 1941-02-13   Discharge location: HOME WITH WIFE WHO CAN PROVIDE SUPERVISION LEVEL  Length of Stay:  8 DAYS  Discharge activity level: SUPERVISION  LEVEL  Home/community participation: YES  Patient response CH:ENIDPO Literacy - How often do you need to have someone help you when you read instructions, pamphlets, or other written material from your doctor or pharmacy?: Never  Patient response EU:MPNTIR Isolation - How often do you feel lonely or isolated from those around you?: Rarely  Services provided included: MD, RD, PT, OT, SLP, RN, CM, Pharmacy, SW  Financial Services:  Charity fundraiser Utilized: Private Insurance [employer]  Choices offered to/list presented to: PT AND WIFE  Follow-up services arranged:  Home Health, Patient/Family has no preference for HH/DME agencies [place]. HAVE ORDERED PORTABLE O2 TANK VIA [place] FOR HOME. SINCE PT ONLY HAS A O2 CONCENTRATOR AT HOME, HE WAS USING AT NIGHT, NOW HE IS CONTINUOUSLY ON O2        Patient response to transportation need: Is the patient able to respond to transportation needs?: Yes In the past 12 months, has lack of transportation kept you from medical appointments or from getting medications?: No In the past 12 months, has lack of transportation kept you from meetings, work, or from getting things needed for daily living?: No    Comments (or additional information):WIFE IS [place]. HIS NEW HD IS TIRING HIM OUT AND HE IS HAVING TROUBLE GETTING USED TO IT.  Patient/Family verbalized understanding of follow-up arrangements:  Yes  Individual responsible for coordination of the follow-up plan: SHIRLEY-WFIE [phone]  Confirmed correct DME delivered: Elease Hashimoto 06/11/2021    Terran Klinke, [place]

## 2021-06-11 NOTE — Progress Notes (Signed)
Physical Therapy Session Note  Patient Details  Name: [name] [name] MRN: [id number] Date of Birth: April 13, 1941  Today's Date: 06/11/2021 PT Individual Time: 1035-1100 PT Individual Time Calculation (min): 25 min   Short Term Goals: Week 1:  PT Short Term Goal 1 (Week 1): STG=LTG due to ELOS  Skilled Therapeutic Interventions/Progress Updates: Pt presented in bed with wife present agreeable to therapy. Session focused on functional mobility including bed mobility, transfers, and ambulation. Pt performed supine to sit mod I with use of bed features. Pt donned shoes with set up and ambulated to ortho gym with RW and supervision. Pt sat at mat and participated in seated LE therex for general conditioning. Pt performed LAQ, seated marches, hip abd/add with 2lb cuff to fatigue ~x20 ea. Pt also performed shoulder flexion and circles with 2lb dowel to fatigue. Once completed pt ambulated back to room in same manner as prior. Pt returned to bed in same manner as prior. Pt left in bed with alarm on, call bell within reach and needs met.      Therapy Documentation Precautions:  Precautions Precautions: Fall Restrictions Weight Bearing Restrictions: No General:   Vital Signs: Therapy Vitals Temp: 97.6 F (36.4 C) Temp Source: Oral Pulse Rate: (!) 58 Resp: 18 BP: (!) 150/53 Patient Position (if appropriate): Lying Oxygen Therapy SpO2: 100 % O2 Device: Nasal Cannula O2 Flow Rate (L/min): 3 L/min Pain: Pain Assessment Pain Score: 0-No pain    Therapy/Group: Individual Therapy  Willena Jeancharles Mattilynn Forrer, PTA  06/11/2021, 4:14 PM

## 2021-06-11 NOTE — Progress Notes (Signed)
Nutrition Follow-up  DOCUMENTATION CODES:   Not applicable  INTERVENTION:  Continue Nepro Shake po TID, each supplement provides 425 kcal and 19 grams protein   Encourage adequate PO intake.   NUTRITION DIAGNOSIS:   Increased nutrient needs related to other (see comment) (therapies) as evidenced by estimated needs; ongoing  GOAL:   Patient will meet greater than or equal to 90% of their needs; progressing  MONITOR:   PO intake, Supplement acceptance, Weight trends, Labs, I & O's  REASON FOR ASSESSMENT:   Malnutrition Screening Tool    ASSESSMENT:   Pt with PMH significant for CAD/CABG, Afib, CHF, AICD, COPD, CKD stage IV presented on 05/22/2021 with recent UTI and progressive weakness and fatigue. Renal ultrasound showed some L renal atrophy; Nephrology consulted for severe AKI on CKD w/ initiation of HD. Pt admitted to CIR on 06/05/21.  Pt is currently in HD. Meal completion has been varied from 10-100%. Pt currently has Nepro shake ordered and has been consuming them. RD to continue with current orders to aid in caloric and protein needs. Labs and medications reviewed.   Diet Order:   Diet Order             Diet renal with fluid restriction Fluid restriction: 1200 mL Fluid; Room service appropriate? Yes; Fluid consistency: Thin  Diet effective now                   EDUCATION NEEDS:   No education needs have been identified at this time  Skin:  Skin Assessment: Reviewed RN Assessment  Last BM:  9/4  Height:   Ht Readings from Last 1 Encounters:  06/05/21 5\' 7"  (1.702 m)    Weight:   Wt Readings from Last 1 Encounters:  06/11/21 78.3 kg   BMI:  Body mass index is 27.04 kg/m.  Estimated Nutritional Needs:   Kcal:  2000-2200  Protein:  100-110 grams  Fluid:  1L+UOP  Corrin [name], MS, RD, LDN RD pager number/after hours weekend pager number on Amion.

## 2021-06-11 NOTE — Progress Notes (Signed)
Spoke to pt's wife via phone. Wife aware of pt's out-pt HD schedule- [facility] (TTS at 12:15). Wife aware that pt will need to arrive at clinic at 11:45 on Sept 13 (first treatment) to complete paperwork. Contacted clinic today to see if they could start pt on Saturday and was advised that was not an option. Clinic will need orders at pt's d/c. Clinic aware pt to begin 9/13.   Melven Sartorius Renal Navigator [phone]

## 2021-06-11 NOTE — Progress Notes (Signed)
[facility] NEPHROLOGY PROGRESS NOTE  Assessment/ Plan:  # AKI/CKD stage IV, oliguric - presumably due to ischemic ATN due to volume depletion and concomitant use of ARB, aldactone, and jardiance.  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  Started on HD 05/28/21 with temp HD catheter. S/p TDC placement 06/02/21 w/ IR. Continue on TTS schedule for now and follow for renal recovery. No signs of renal recovery so far.  Outpatient HD arranged at [facility], TTS with schedule chair time 12:15 PM, to start from 9/13.   Plan for dialysis today.  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  After consulting with a cardiologist the Coumadin is stopped.  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  No ESA due to pelvic mass.  #A fib - on coumadin and carvedilol.  # CKD-BMD : Phosphorus level improved.  Not on binders.  Pending PTH level.  May benefit from vitamin D.  Encourage oral intake.  #COPD: Continue bronchodilators.  Volume management as above.  Subjective: Seen and examined.  He is actively participating in inpatient rehab.  No new event.  Hematuria improving.  Objective Vital signs in last 24 hours: Vitals:   06/10/21 0503 06/10/21 1532 06/10/21 1944 06/11/21 0457  BP: (!) 143/52 (!) 134/56 (!) 144/58 (!) 151/49  Pulse: (!) 53 (!) 56 (!) 59 60  Resp: 18 17 14 20   Temp: 98.3 F (36.8 C) 97.7 F (36.5 C) 99.3 F (37.4 C) 98.6 F (37 C)  TempSrc:  Oral Oral Oral  SpO2: 100% 100% 100% 99%  Weight:    73.9 kg  Height:       Weight change: -1.9 kg  Intake/Output Summary (Last 24 hours) at 06/11/2021 0913 Last data filed at 06/11/2021 0500 Gross per 24 hour  Intake 440 ml  Output 400 ml  Net 40 ml        Labs: Basic Metabolic Panel: Recent Labs  Lab 06/06/21 1327 06/09/21 1340 06/11/21 0447  NA 132* 132* 135  K  3.9 3.5 3.5  CL 97* 98 97*  CO2 27 29 29   GLUCOSE 104* 118* 91  BUN 33* 13 14  CREATININE 4.80* 2.79* 4.22*  CALCIUM 8.2* 8.0* 8.7*  PHOS 3.2 1.6* 3.0    Liver Function Tests: Recent Labs  Lab 06/06/21 1327 06/09/21 1340 06/11/21 0447  ALBUMIN 2.4* 2.4* 2.4*    No results for input(s): LIPASE, AMYLASE in the last 168 hours. No results for input(s): AMMONIA in the last 168 hours. CBC: Recent Labs  Lab 06/05/21 1608 06/06/21 1327 06/08/21 0816 06/09/21 1340 06/11/21 0447  WBC 9.0 10.4 11.7* 7.9 9.5  HGB 8.7* 8.7* 8.5* 8.2* 8.0*  HCT 26.8* 27.5* 26.4* 25.8* 25.8*  MCV 95.7 97.2 96.4 97.4 98.1  PLT 237 249 226 215 244    Cardiac Enzymes: No results for input(s): CKTOTAL, CKMB, CKMBINDEX, TROPONINI in the last 168 hours. CBG: Recent Labs  Lab 06/05/21 1201 06/05/21 1651 06/05/21 2105 06/06/21 0550 06/06/21 1156  GLUCAP 87 94 89 83 88     Iron Studies: No results for input(s): IRON, TIBC, TRANSFERRIN, FERRITIN in the last 72 hours. Studies/Results: No results found.  Medications: Infusions:   Scheduled Medications:  amLODipine  10 mg Oral Daily   budesonide  0.25 mg Nebulization BID   Chlorhexidine Gluconate Cloth  6 each Topical Q0600   Chlorhexidine Gluconate  Cloth  6 each Topical Q0600   Chlorhexidine Gluconate Cloth  6 each Topical Q0600   feeding supplement (NEPRO CARB STEADY)  237 mL Oral TID BM   ipratropium-albuterol  3 mL Nebulization BID   levothyroxine  112 mcg Oral Q0600   multivitamin  1 tablet Oral QHS   pantoprazole  40 mg Oral BID   pravastatin  40 mg Oral q1800    have reviewed scheduled and prn medications.  Physical Exam: General:NAD, working with therapy.  Looks comfortable. Heart:RRR, s1s2 nl Lungs: Clear anteriorly, no wheezing Abdomen:soft, Non-tender, non-distended Extremities:No edema Dialysis Access: IJ TDC in place  [name] [name] 06/11/2021,9:13 AM  LOS: 6 days

## 2021-06-12 DIAGNOSIS — I5043 Acute on chronic combined systolic (congestive) and diastolic (congestive) heart failure: Secondary | ICD-10-CM

## 2021-06-12 LAB — PARATHYROID HORMONE, INTACT (NO CA): PTH: 23 pg/mL (ref 15–65)

## 2021-06-12 MED ORDER — RENA-VITE PO TABS: 1.0000 | ORAL_TABLET | Freq: Every day | ORAL | 0 refills | Status: DC

## 2021-06-12 MED ORDER — ALBUTEROL SULFATE HFA 108 (90 BASE) MCG/ACT IN AERS: 2.0000 | INHALATION_SPRAY | Freq: Four times a day (QID) | RESPIRATORY_TRACT | 1 refills | Status: DC | PRN

## 2021-06-12 MED ORDER — VITAMIN D3 125 MCG (5000 UT) PO CAPS: 5000.0000 [IU] | ORAL_CAPSULE | Freq: Every day | ORAL | 0 refills | Status: AC

## 2021-06-12 MED ORDER — AMLODIPINE BESYLATE 10 MG PO TABS: 10.0000 mg | ORAL_TABLET | Freq: Every day | ORAL | 0 refills | Status: DC

## 2021-06-12 MED ORDER — LEVOTHYROXINE SODIUM 112 MCG PO TABS: 112.0000 ug | ORAL_TABLET | Freq: Every day | ORAL | 3 refills | Status: DC

## 2021-06-12 MED ORDER — ACETAMINOPHEN 325 MG PO TABS: 650.0000 mg | ORAL_TABLET | Freq: Four times a day (QID) | ORAL | Status: AC | PRN

## 2021-06-12 MED ORDER — PRAVASTATIN SODIUM 40 MG PO TABS: 40.0000 mg | ORAL_TABLET | Freq: Every day | ORAL | 0 refills | Status: DC

## 2021-06-12 MED ORDER — CHLORHEXIDINE GLUCONATE CLOTH 2 % EX PADS
6.0000 | MEDICATED_PAD | Freq: Every day | CUTANEOUS | Status: DC
  Administered 2021-06-12: 6 via TOPICAL

## 2021-06-12 NOTE — Progress Notes (Signed)
Discussed d/c plans for tomorrow after HD (clinic unable to provide treatment tomorrow) with renal provider. Aware clinic is in need of orders at d/c. Clinic aware pt to begin on 9/13.   Melven Sartorius Renal Navigator [phone]

## 2021-06-12 NOTE — Progress Notes (Signed)
Occupational Therapy Discharge Summary  Patient Details  Name: [name] [name] MRN: [id number] Date of Birth: 27-Jan-1941   Patient has met 9 of 10 long term goals due to improved activity tolerance, improved balance, postural control, and ability to compensate for deficits.  Patient to discharge at overall Supervision level.  Patient's care partner is independent to provide the necessary  supervision  assistance at discharge.  [name] present for education and verbalizes understanding of recommendation for close supervision with mobility and self-care tasks.  Reasons goals not met: Pt requires CGA for shower transfer.    Recommendation:  Patient will benefit from ongoing skilled OT services in home health setting to continue to advance functional skills in the area of BADL and Reduce care partner burden.  Equipment: No equipment provided  Reasons for discharge: treatment goals met and discharge from hospital  Patient/family agrees with progress made and goals achieved: Yes  OT Discharge Precautions/Restrictions  Precautions Precautions: Fall Precaution Comments: 2-3L of O2 as needed  Pain Pain Assessment Pain Scale: 0-10 Pain Score: 0-No pain Pain Type: Other (Comment) (Denies pain) ADL ADL ADL Comments: on eval, pt donned hospital gown, pants and socks with PT with min A but refused to work on toileting, bathing for OT as he stated he was too fatigued and felt he was not breathing well enough (RN aware, pt's vitals stable) Vision Baseline Vision/History: 1 Wears glasses Patient Visual Report: No change from baseline Vision Assessment?: No apparent visual deficits Perception  Perception: Within Functional Limits Praxis Praxis: Intact Cognition Overall Cognitive Status: Within Functional Limits for tasks assessed Arousal/Alertness: Awake/alert Orientation Level: Oriented X4 Year: 2022 Month: September Day of Week: Correct Attention: Selective Selective  Attention: Appears intact Memory: Appears intact Immediate Memory Recall: Sock;Blue;Bed Memory Recall Sock: Without Cue Memory Recall Blue: Without Cue Memory Recall Bed: Without Cue Awareness: Appears intact Problem Solving: Appears intact Safety/Judgment: Appears intact Sensation Sensation Light Touch: Appears Intact Proprioception: Appears Intact Coordination Gross Motor Movements are Fluid and Coordinated: Yes Fine Motor Movements are Fluid and Coordinated: No Coordination and Movement Description: generalized weakness and deconditioning Finger Nose Finger Test: tremors bilaterally Heel Shin Test: decreased speed bilaterally Motor  Motor Motor: Within Functional Limits Motor - Skilled Clinical Observations: generalized weakness and deconditioning Mobility  Bed Mobility Bed Mobility: Rolling Right;Rolling Left;Sit to Supine;Supine to Sit Rolling Right: Independent with assistive device Rolling Left: Independent with assistive device Supine to Sit: Independent with assistive device Sit to Supine: Independent with assistive device Transfers Sit to Stand: Supervision/Verbal cueing Stand to Sit: Supervision/Verbal cueing  Trunk/Postural Assessment  Cervical Assessment Cervical Assessment: Exceptions to [facility] (forward head) Thoracic Assessment Thoracic Assessment: Exceptions to [facility] (excessive kyphosis) Lumbar Assessment Lumbar Assessment: Exceptions to [facility] (posterior pelvic tilt) Postural Control Postural Control: Within Functional Limits  Balance Balance Balance Assessed: Yes Static Sitting Balance Static Sitting - Balance Support: Feet supported;Bilateral upper extremity supported Static Sitting - Level of Assistance: 7: Independent Dynamic Sitting Balance Dynamic Sitting - Balance Support: Feet supported;No upper extremity supported Dynamic Sitting - Level of Assistance: 6: Modified independent (Device/Increase time) Static Standing Balance Static Standing - Balance  Support: Bilateral upper extremity supported (RW) Static Standing - Level of Assistance: 5: Stand by assistance (supervision) Dynamic Standing Balance Dynamic Standing - Balance Support: Bilateral upper extremity supported (RW) Dynamic Standing - Level of Assistance: 5: Stand by assistance (supervision) Extremity/Trunk Assessment RUE Assessment RUE Assessment: Within Functional Limits LUE Assessment LUE Assessment: Within Functional Limits   Yanet Balliet, [facility] 06/12/2021, 12:05  PM

## 2021-06-12 NOTE — Progress Notes (Signed)
[facility] NEPHROLOGY PROGRESS NOTE  Assessment/ Plan:  # AKI/CKD stage IV, oliguric - presumably due to ischemic ATN due to volume depletion and concomitant use of ARB, aldactone, and jardiance.  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  Started on HD 05/28/21 with temp HD catheter. S/p TDC placement 06/02/21 w/ IR. Continue on TTS schedule for now and follow for renal recovery. No signs of renal recovery so far.  Outpatient HD arranged at [facility], TTS with schedule chair time 12:15 PM, to start from 9/13.   He had dialysis yesterday with 3 L ultrafiltration, tolerated well.  We will plan for HD tomorrow morning for possible discharge in the afternoon.  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  After consulting with a cardiologist the Coumadin is stopped.  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  No ESA due to pelvic mass.  Hemoglobin is stable.  #A fib -off of coumadin because of bleeding.  Rate controlled with carvedilol.  # CKD-BMD : Phosphorus level improved.  Not on binders.  PTH level 23, not on VDRA.   Encourage oral intake.  #COPD: Continue bronchodilators.  Volume management as above.  Subjective: Seen and examined.  Tolerating dialysis well and participating in inpatient rehab.  No new event.  Objective Vital signs in last 24 hours: Vitals:   06/11/21 1952 06/11/21 2025 06/12/21 0442 06/12/21 0501  BP: (!) 161/67   (!) 130/44  Pulse: (!) 56 (!) 59  62  Resp: 17 18  18   Temp: 97.7 F (36.5 C)   98.8 F (37.1 C)  TempSrc: Oral   Oral  SpO2: 99% 99%  98%  Weight:   69.9 kg   Height:       Weight change: -1.3 kg  Intake/Output Summary (Last 24 hours) at 06/12/2021 0938 Last data filed at 06/11/2021 1914 Gross per 24 hour  Intake 120 ml  Output 3000 ml  Net -2880 ml        Labs: Basic Metabolic  Panel: Recent Labs  Lab 06/06/21 1327 06/09/21 1340 06/11/21 0447  NA 132* 132* 135  K 3.9 3.5 3.5  CL 97* 98 97*  CO2 27 29 29   GLUCOSE 104* 118* 91  BUN 33* 13 14  CREATININE 4.80* 2.79* 4.22*  CALCIUM 8.2* 8.0* 8.7*  PHOS 3.2 1.6* 3.0    Liver Function Tests: Recent Labs  Lab 06/06/21 1327 06/09/21 1340 06/11/21 0447  ALBUMIN 2.4* 2.4* 2.4*    No results for input(s): LIPASE, AMYLASE in the last 168 hours. No results for input(s): AMMONIA in the last 168 hours. CBC: Recent Labs  Lab 06/05/21 1608 06/06/21 1327 06/08/21 0816 06/09/21 1340 06/11/21 0447  WBC 9.0 10.4 11.7* 7.9 9.5  HGB 8.7* 8.7* 8.5* 8.2* 8.0*  HCT 26.8* 27.5* 26.4* 25.8* 25.8*  MCV 95.7 97.2 96.4 97.4 98.1  PLT 237 249 226 215 244    Cardiac Enzymes: No results for input(s): CKTOTAL, CKMB, CKMBINDEX, TROPONINI in the last 168 hours. CBG: Recent Labs  Lab 06/05/21 1201 06/05/21 1651 06/05/21 2105 06/06/21 0550 06/06/21 1156  GLUCAP 87 94 89 83 88     Iron Studies: No results for input(s): IRON, TIBC, TRANSFERRIN, FERRITIN in the last 72 hours. Studies/Results: ECHOCARDIOGRAM COMPLETE  Result Date: 06/11/2021    ECHOCARDIOGRAM REPORT   Patient Name:   [name] [name] Date of  Exam: 06/11/2021 Medical Rec #:  [id number]       Height:       67.0 in Accession #:    [id number]      Weight:       162.9 lb Date of Birth:  [birth year]       BSA:          1.853 m Patient Age:    80 years        BP:           151/49 mmHg Patient Gender: M               HR:           61 bpm. Exam Location:  Inpatient Procedure: 2D Echo, Color Doppler and Cardiac Doppler Indications:    F81.82 Acute systolic (congestive) heart failure  History:        Patient has prior history of Echocardiogram examinations, most                 recent 11/04/2020. CHF, Defibrillator, COPD, Arrythmias:Atrial                 Fibrillation; Risk Factors:Hypertension and Dyslipidemia.  Sonographer:    Darlina Sicilian RDCS Referring Phys: [place]  1. Left ventricular ejection fraction, by estimation, is 50 to 55%. The left ventricle has low normal function. The left ventricle has no regional wall motion abnormalities. There is moderate left ventricular hypertrophy. Left ventricular diastolic parameters are indeterminate.  2. Right ventricular systolic function is mildly reduced. The right ventricular size is mildly enlarged. There is mildly elevated pulmonary artery systolic pressure.  3. Left atrial size was severely dilated.  4. The mitral valve is normal in structure. Mild mitral valve regurgitation. No evidence of mitral stenosis.  5. The aortic valve is tricuspid. Aortic valve regurgitation is not visualized. Mild to moderate aortic valve sclerosis/calcification is present, without any evidence of aortic stenosis. FINDINGS  Left Ventricle: Left ventricular ejection fraction, by estimation, is 50 to 55%. The left ventricle has low normal function. The left ventricle has no regional wall motion abnormalities. The left ventricular internal cavity size was normal in size. There is moderate left ventricular hypertrophy. Left ventricular diastolic parameters are indeterminate. Right Ventricle: The right ventricular size is mildly enlarged. Right vetricular wall thickness was not well visualized. Right ventricular systolic function is mildly reduced. There is mildly elevated pulmonary artery systolic pressure. The tricuspid regurgitant velocity is 2.89 m/s, and with an assumed right atrial pressure of 8 mmHg, the estimated right ventricular systolic pressure is 99.3 mmHg. Left Atrium: Left atrial size was severely dilated. Right Atrium: Right atrial size was normal in size. Pericardium: There is no evidence of pericardial effusion. Mitral Valve: The mitral valve is normal in structure. Mild mitral valve regurgitation. No evidence of mitral valve stenosis. Tricuspid Valve: The tricuspid valve is normal in structure. Tricuspid valve  regurgitation is mild. Aortic Valve: The aortic valve is tricuspid. Aortic valve regurgitation is not visualized. Mild to moderate aortic valve sclerosis/calcification is present, without any evidence of aortic stenosis. Pulmonic Valve: The pulmonic valve was grossly normal. Pulmonic valve regurgitation is trivial. Aorta: The aortic root is normal in size and structure. IAS/Shunts: The interatrial septum was not well visualized.  LEFT VENTRICLE PLAX 2D LVIDd:         5.50 cm LVIDs:         4.50 cm LV PW:  1.30 cm LV IVS:        1.10 cm LVOT diam:     2.10 cm LV SV:         55 LV SV Index:   30 LVOT Area:     3.46 cm  LV Volumes (MOD) LV vol d, MOD A2C: 101.0 ml LV vol d, MOD A4C: 129.0 ml LV vol s, MOD A2C: 45.7 ml LV vol s, MOD A4C: 73.7 ml LV SV MOD A2C:     55.4 ml LV SV MOD A4C:     129.0 ml LV SV MOD BP:      60.1 ml RIGHT VENTRICLE RV S prime:     6.42 cm/s TAPSE (M-mode): 1.6 cm LEFT ATRIUM             Index       RIGHT ATRIUM           Index LA diam:        6.30 cm 3.40 cm/m  RA Area:     17.50 cm LA Vol (A2C):   84.7 ml 45.70 ml/m RA Volume:   41.90 ml  22.61 ml/m LA Vol (A4C):   86.8 ml 46.83 ml/m LA Biplane Vol: 90.2 ml 48.67 ml/m  AORTIC VALVE LVOT Vmax:   71.10 cm/s LVOT Vmean:  40.600 cm/s LVOT VTI:    0.159 m  AORTA Ao Root diam: 3.50 cm Ao Asc diam:  3.40 cm MITRAL VALVE               TRICUSPID VALVE MV Area (PHT): 3.96 cm    TR Peak grad:   33.4 mmHg MV Decel Time: 192 msec    TR Vmax:        289.00 cm/s MV E velocity: 86.10 cm/s                            SHUNTS                            Systemic VTI:  0.16 m                            Systemic Diam: 2.10 cm Oswaldo Milian MD Electronically signed by Oswaldo Milian MD Signature Date/Time: 06/11/2021/12:13:35 PM    Final     Medications: Infusions:   Scheduled Medications:  amLODipine  10 mg Oral Daily   budesonide  0.25 mg Nebulization BID   Chlorhexidine Gluconate Cloth  6 each Topical Q0600   feeding supplement  (NEPRO CARB STEADY)  237 mL Oral TID BM   ipratropium-albuterol  3 mL Nebulization BID   levothyroxine  112 mcg Oral Q0600   multivitamin  1 tablet Oral QHS   pantoprazole  40 mg Oral BID   pravastatin  40 mg Oral q1800    have reviewed scheduled and prn medications.  Physical Exam: General:NAD, working with physical therapy.  Looks comfortable. Heart:RRR, s1s2 nl Lungs: Clear anteriorly, no wheezing Abdomen:soft, Non-tender, non-distended Extremities:No edema Dialysis Access: IJ TDC in place  Allisen Pidgeon Prasad Thayne Cindric 06/12/2021,9:38 AM  LOS: 7 days

## 2021-06-12 NOTE — Progress Notes (Signed)
Occupational Therapy Session Note  Patient Details  Name: [name] [name] MRN: [id number] Date of Birth: 1941-09-17  Today's Date: 06/12/2021 OT Individual Time: 9628-3662 and 1400-1453 OT Individual Time Calculation (min): 53 min and 53 min   Short Term Goals: Week 1:  OT Short Term Goal 1 (Week 1): STGs = LTGs  Skilled Therapeutic Interventions/Progress Updates:    1) Treatment session with focus on family education and d/c planning. Pt received semi-reclined in bed reporting fatigue from previous therapy session but agreeable to attempting activity during session.  Pt reports already bathed and dressed yesterday and was able to complete all tasks supervision/setup.  Pt declines need for self-care this session. Pt ambulated 100' on room air, 02 dropping to 88% and HR to 42, O2 able to return to 93% with cues for breathing technique.  Pt reports feeling winded and having difficulty catching his breath, despite sats returning to low 90s.  Therapist reapplied 2L O2 via nasal canula.  Therapist educated pt and wife on energy conservation strategies and provided handout while discussing basic principles and more specifics in regards to self-care tasks.  Pt remained upright in recliner with all needs in reach.  2) Treatment session with focus on functional mobility, activity tolerance, and d/c planning.  Pt received supine in bed reporting having remained upright until ~1:30.  Pt ambulated 100' with RW on room air, O2 dropping to 92% and returning to 94% with rest break.  Pt transported remainder of way to gym via w/c for energy conservation.  Pt engaged in [place] in sitting BLE strengthening and endurance with focus on increased time between rest breaks.  Pt able to complete up to 1 minute before requiring rest break.  Pt completed 5 reps and then reports too fatigue to continue.  Pt returned to room and transferred back to bed supervision.  Pt educated on BUE strengthening HEP with theraband and  therapist provided pt and wife with handout after demonstration of each exercise.  Pt able to verbalize understanding of recommendations.  Therapist reiterated importance of energy conservation strategies and increased activity tolerance.  Pt remained semi-reclined in bed with O2 via nasal canula.    Therapy Documentation Precautions:  Precautions Precautions: Fall Precaution Comments: 2-3L of O2 as needed Restrictions Weight Bearing Restrictions: No  Pain:  Pt with no c/o pain   Therapy/Group: Individual Therapy  Simonne Come 06/12/2021, 12:04 PM

## 2021-06-12 NOTE — Progress Notes (Addendum)
Patient ID: [name] [name], male   DOB: [birth year], 80 y.o.   MRN: [id number] Met with pt and wife who report they have a concentrator at home but do not have a portable O2 tank now he is on continuous O2. Have made referral to [facility] where he receives his O2. Hope they will deliver the portable O2 today to pt's room so prepared for discharge tomorrow.  3:00 PM Spoke with [employer] who assures worker this evening or tomorrow am the portable O2 tank will be delivered to pt's room. Aware of discharge tomorrow after HD. Wife and pt aware of this.

## 2021-06-12 NOTE — Progress Notes (Signed)
PROGRESS NOTE   Subjective/Complaints: [name] [name] has no new complaints this morning He is eager for discharge tomorrow. Appreciate SW assistance in getting portable O2 delivered   ROS:  Pt denies abd pain, CP, N/V/C/D, and vision changes    Objective:   ECHOCARDIOGRAM COMPLETE  Result Date: 06/11/2021    ECHOCARDIOGRAM REPORT   Patient Name:   [name] [name] Date of Exam: 06/11/2021 Medical Rec #:  [id number]       Height:       67.0 in Accession #:    [id number]      Weight:       162.9 lb Date of Birth:  [birth year]       BSA:          1.853 m Patient Age:    80 years        BP:           151/49 mmHg Patient Gender: M               HR:           61 bpm. Exam Location:  Inpatient Procedure: 2D Echo, Color Doppler and Cardiac Doppler Indications:    Q76.19 Acute systolic (congestive) heart failure  History:        Patient has prior history of Echocardiogram examinations, most                 recent 11/04/2020. CHF, Defibrillator, COPD, Arrythmias:Atrial                 Fibrillation; Risk Factors:Hypertension and Dyslipidemia.  Sonographer:    Darlina Sicilian RDCS Referring Phys: [place]  1. Left ventricular ejection fraction, by estimation, is 50 to 55%. The left ventricle has low normal function. The left ventricle has no regional wall motion abnormalities. There is moderate left ventricular hypertrophy. Left ventricular diastolic parameters are indeterminate.  2. Right ventricular systolic function is mildly reduced. The right ventricular size is mildly enlarged. There is mildly elevated pulmonary artery systolic pressure.  3. Left atrial size was severely dilated.  4. The mitral valve is normal in structure. Mild mitral valve regurgitation. No evidence of mitral stenosis.  5. The aortic valve is tricuspid. Aortic valve regurgitation is not visualized. Mild to moderate aortic valve sclerosis/calcification is present,  without any evidence of aortic stenosis. FINDINGS  Left Ventricle: Left ventricular ejection fraction, by estimation, is 50 to 55%. The left ventricle has low normal function. The left ventricle has no regional wall motion abnormalities. The left ventricular internal cavity size was normal in size. There is moderate left ventricular hypertrophy. Left ventricular diastolic parameters are indeterminate. Right Ventricle: The right ventricular size is mildly enlarged. Right vetricular wall thickness was not well visualized. Right ventricular systolic function is mildly reduced. There is mildly elevated pulmonary artery systolic pressure. The tricuspid regurgitant velocity is 2.89 m/s, and with an assumed right atrial pressure of 8 mmHg, the estimated right ventricular systolic pressure is 50.9 mmHg. Left Atrium: Left atrial size was severely dilated. Right Atrium: Right atrial size was normal in size. Pericardium: There is no evidence of pericardial effusion. Mitral Valve: The mitral valve  is normal in structure. Mild mitral valve regurgitation. No evidence of mitral valve stenosis. Tricuspid Valve: The tricuspid valve is normal in structure. Tricuspid valve regurgitation is mild. Aortic Valve: The aortic valve is tricuspid. Aortic valve regurgitation is not visualized. Mild to moderate aortic valve sclerosis/calcification is present, without any evidence of aortic stenosis. Pulmonic Valve: The pulmonic valve was grossly normal. Pulmonic valve regurgitation is trivial. Aorta: The aortic root is normal in size and structure. IAS/Shunts: The interatrial septum was not well visualized.  LEFT VENTRICLE PLAX 2D LVIDd:         5.50 cm LVIDs:         4.50 cm LV PW:         1.30 cm LV IVS:        1.10 cm LVOT diam:     2.10 cm LV SV:         55 LV SV Index:   30 LVOT Area:     3.46 cm  LV Volumes (MOD) LV vol d, MOD A2C: 101.0 ml LV vol d, MOD A4C: 129.0 ml LV vol s, MOD A2C: 45.7 ml LV vol s, MOD A4C: 73.7 ml LV SV MOD A2C:      55.4 ml LV SV MOD A4C:     129.0 ml LV SV MOD BP:      60.1 ml RIGHT VENTRICLE RV S prime:     6.42 cm/s TAPSE (M-mode): 1.6 cm LEFT ATRIUM             Index       RIGHT ATRIUM           Index LA diam:        6.30 cm 3.40 cm/m  RA Area:     17.50 cm LA Vol (A2C):   84.7 ml 45.70 ml/m RA Volume:   41.90 ml  22.61 ml/m LA Vol (A4C):   86.8 ml 46.83 ml/m LA Biplane Vol: 90.2 ml 48.67 ml/m  AORTIC VALVE LVOT Vmax:   71.10 cm/s LVOT Vmean:  40.600 cm/s LVOT VTI:    0.159 m  AORTA Ao Root diam: 3.50 cm Ao Asc diam:  3.40 cm MITRAL VALVE               TRICUSPID VALVE MV Area (PHT): 3.96 cm    TR Peak grad:   33.4 mmHg MV Decel Time: 192 msec    TR Vmax:        289.00 cm/s MV E velocity: 86.10 cm/s                            SHUNTS                            Systemic VTI:  0.16 m                            Systemic Diam: 2.10 cm Oswaldo Milian MD Electronically signed by Oswaldo Milian MD Signature Date/Time: 06/11/2021/12:13:35 PM    Final    Recent Labs    06/09/21 1340 06/11/21 0447  WBC 7.9 9.5  HGB 8.2* 8.0*  HCT 25.8* 25.8*  PLT 215 244   Recent Labs    06/09/21 1340 06/11/21 0447  NA 132* 135  K 3.5 3.5  CL 98 97*  CO2 29 29  GLUCOSE 118* 91  BUN 13 14  CREATININE 2.79* 4.22*  CALCIUM 8.0* 8.7*    Intake/Output Summary (Last 24 hours) at 06/12/2021 1311 Last data filed at 06/12/2021 0830 Gross per 24 hour  Intake --  Output 3100 ml  Net -3100 ml        Physical Exam: Vital Signs Blood pressure (!) 130/44, pulse 62, temperature 98.8 F (37.1 C), temperature source Oral, resp. rate 18, height 5\' 7"  (1.702 m), weight 69.9 kg, SpO2 98 %. Gen: no distress, normal appearing HEENT: oral mucosa pink and moist, NCAT Cardio: Reg rate Pulmonary:     Effort: Pulmonary effort is normal.     Breath sounds: Normal breath sounds.     Comments: + [place], +wheezing, mild dyspnea on exertion Abdominal:     General: Abdomen is flat. There is no distension.     Comments: Bowel  sounds hypoactive  Musculoskeletal:     Cervical back: Normal range of motion and neck supple.     Comments: No edema or tenderness in extremities  Skin:    General: Skin is warm and dry.  Neurological:     Mental Status: He is alert.     Comments: Alert and oriented Makes eye contact with examiner. Motor: Bilateral upper extremities: 4/5 proximal distal Right lower extremity: 4/5 proximal distal Left lower extremity: 4-/5 proximal distal  Psychiatric:        Mood and Affect: Mood normal.        Behavior: Behavior normal.  GU: Blood in urinal   Assessment/Plan: 1. Functional deficits which require 3+ hours per day of interdisciplinary therapy in a comprehensive inpatient rehab setting. Physiatrist is providing close team supervision and 24 hour management of active medical problems listed below. Physiatrist and rehab team continue to assess barriers to discharge/monitor patient progress toward functional and medical goals  Care Tool:  Bathing  Bathing activity did not occur: Refused Body parts bathed by patient: Front perineal area, Buttocks, Right arm, Left arm, Chest, Abdomen, Face         Bathing assist Assist Level: Set up assist     Upper Body Dressing/Undressing Upper body dressing   What is the patient wearing?: Pull over shirt    Upper body assist Assist Level: Set up assist    Lower Body Dressing/Undressing Lower body dressing      What is the patient wearing?: Pants, Underwear/pull up     Lower body assist Assist for lower body dressing: Set up assist     Toileting Toileting Toileting Activity did not occur (Clothing management and hygiene only): Refused  Toileting assist Assist for toileting: Supervision/Verbal cueing Assistive Device Comment: urinal   Transfers Chair/bed transfer  Transfers assist     Chair/bed transfer assist level: Supervision/Verbal cueing     Locomotion Ambulation   Ambulation assist      Assist level:  Supervision/Verbal cueing Assistive device: Walker-rolling Max distance: >15ft   Walk 10 feet activity   Assist     Assist level: Supervision/Verbal cueing Assistive device: Walker-rolling   Walk 50 feet activity   Assist Walk 50 feet with 2 turns activity did not occur: Safety/medical concerns  Assist level: Supervision/Verbal cueing Assistive device: Walker-rolling    Walk 150 feet activity   Assist Walk 150 feet activity did not occur: Safety/medical concerns  Assist level: Supervision/Verbal cueing Assistive device: Walker-rolling    Walk 10 feet on uneven surface  activity   Assist Walk 10 feet on uneven surfaces activity did not occur: Safety/medical concerns (fatigue, generalized weakness, deconditioning)  Wheelchair     Assist Is the patient using a wheelchair?: No Type of Wheelchair: Manual    Wheelchair assist level: Supervision/Verbal cueing Max wheelchair distance: >144ft    Wheelchair 50 feet with 2 turns activity    Assist        Assist Level: Supervision/Verbal cueing   Wheelchair 150 feet activity     Assist      Assist Level: Supervision/Verbal cueing   Blood pressure (!) 130/44, pulse 62, temperature 98.8 F (37.1 C), temperature source Oral, resp. rate 18, height 5\' 7"  (1.702 m), weight 69.9 kg, SpO2 98 %.    Medical Problem List and Plan: 1.   Debility secondary to AKI on CKD/multi medical             -patient may not shower             -ELOS/Goals: 4-6 days/supervision/mod I            Continue CIR/PT and OT- as tolerated today- feels "really bad"  Plan for d/c home with wife tomorrow.  2.  Hematuria: Coumadin held- appreciate urology and cardiology following- patient has appointments with both within 2 weeks of discharge.              -antiplatelet therapy: N/A 3. Pain Management: Tylenol as needed 4. Mood: Provide emotional support             -antipsychotic agents: N/A 5. Neuropsych: This  patient is capable of making decisions on his own behalf. 6. Skin/Wound Care: Routine skin checks 7. Fluids/Electrolytes/Nutrition: Routine in and outs 8.  AKI on CKD.  Hemodialysis initiated.  Follow-up nephrology services             Labs with HD  D/c after Saturday HD- please try to get on earliest schedule 9.  Acute on chronic anemia.  Patient did receive a total of 3 units packed red blood cells.  Continue iron supplement  9/5- Hb 8.5- stable- con't to monitor             CBC ordered 10.  History of COPD/asthma.  Chronically on 2.5-3 L oxygen nightly. Ordered incentive spirometer to improve breathing strength and ability over time. 11.  Diastolic congestive heart failure.  Monitor for any signs of fluid overload.  Currently on amlodipine, continue  Hydralazine Entresto and Aldactone currently on hold  9/5- having CHF exacerbation based on CXR results today- (ordered 2 view CXR)- having HD again today per renal. 12.  Atrial fibrillation. Coumadin held given hematuria 13.  Hyperlipidemia.  Pravachol 14.  GERD.  Continue Protonix 15.  Hypothyroidism: Continue Synthroid 16. Overweight BMI 27.97: provide dietary education.  17. Bradycardia: coreg resumed by cardiology 18. Pelvic mass  9/5- has pelvic mass- could be cause- has restarted as of last night- Hb stable- so will con't to monitor- and call Urology if gets worse- mainly clots- U/A and Cx ordered per nursing requirements due to clots.  9/6: appreciate renal and cardiology input- continue warfarin despite hematuria  9/7: plan for outpatient eval of pelvic mass. Discussed this plan with patient and wife- they were unaware and patient is not sure if he wants to proceed with evaluation- would like to discuss risks and benefits with urology first. They will be following with urology outpatient   LOS: 7 days A FACE TO FACE EVALUATION WAS [place] 06/12/2021, 1:11 PM

## 2021-06-12 NOTE — Progress Notes (Signed)
Physical Therapy Discharge Summary  Patient Details  Name: [name] [name] MRN: [id number] Date of Birth: 02/23/1941  Today's Date: 06/12/2021 PT Individual Time: 0800-0857 PT Individual Time Calculation (min): 57 min    Today's Date: 06/12/2021 PT Missed Time: 18 Minutes Missed Time Reason: Patient fatigue  Patient has met 10 of 10 long term goals due to improved activity tolerance, improved balance, and improved postural control. Patient to discharge at an ambulatory level Supervision.  Patient's care partner is independent to provide the necessary physical assistance at discharge. Pt's wife has briefly been present during some therapy sessions and is able to provide 24/7 supervision upon discharge.   All goals met   Recommendation:  Patient will benefit from ongoing skilled PT services in home health setting to continue to advance safe functional mobility, address ongoing impairments in transfers, generalized strengthening, dynamic standing balance/coordination, gait training, endurance, and to minimize fall risk.  Equipment: No equipment provided; already has RW  Reasons for discharge: treatment goals met and discharge from hospital  Patient/family agrees with progress made and goals achieved: Yes   Today's Interventions: Received pt supine in bed, pt agreeable to PT treatment, and denied any pain during session. Pt on 3L O2 via [place] at rest with O2 sat 94% but reported feeling SOB. Encouraged OOB mobility to expand chest/lungs and pt agreed but wanted to "take it easy". Session with emphasis on discharge planning, functional mobility/transfers, generalized strengthening, dynamic standing balance/coordination, gait training, and improved activity tolerance. Pt performed bed mobility mod I using bedrails x 2 trials throughout session. Donned/doffed shoes sitting EOB with set up assist. Stand<>pivot bed<>WC without AD and very close supervision. Pt performed WC mobility 155ft using BUE and  supervision with emphasis on UE strength and cardiovascular endurance - min cues to increase propulsion stride. Took seated rest break then ambulated 132ft x 2 trials with RW and supervision with total A to manage O2 tank. Pt with 2 minor instance of R toe catching but pt able to correct without assist. O2 sat 90% increasing to 95% on 2L. Pt stated "are we done yet?" and requested to return to room. Pt performed the following exercises sitting EOB with supervision and verbal cues for technique: -alternating marches for 45 seconds x 2 trials  -LAQ 2x20 -hip abduction with grn TB 1x10 1x15 -hip adduction pillow squeezes 2x20 Concluded session with pt supine in bed, needs within reach, and bed alarm on. Pt requested to be left on 3L O2 - O2 sat 96%. 15 minutes missed of skilled physical therapy due to fatigue.   PT Discharge Precautions/Restrictions Precautions Precautions: Fall Precaution Comments: 2-3L of O2 as needed Restrictions Weight Bearing Restrictions: No Pain Interference Pain Interference Pain Effect on Sleep: 0. Does not apply - I have not had any pain or hurting in the past 5 days Pain Interference with Therapy Activities: 0. Does not apply - I have not received rehabilitationtherapy in the past 5 days Pain Interference with Day-to-Day Activities: 1. Rarely or not at all Cognition Overall Cognitive Status: Within Functional Limits for tasks assessed Arousal/Alertness: Awake/alert Orientation Level: Oriented X4 Memory: Appears intact Awareness: Appears intact Problem Solving: Appears intact Safety/Judgment: Appears intact Sensation Sensation Light Touch: Appears Intact Proprioception: Appears Intact Coordination Gross Motor Movements are Fluid and Coordinated: Yes Fine Motor Movements are Fluid and Coordinated: No Coordination and Movement Description: generalized weakness and deconditioning Finger Nose Finger Test: tremors bilaterally Heel Shin Test: decreased speed  bilaterally Motor  Motor Motor: Within Functional Limits  Motor - Skilled Clinical Observations: generalized weakness and deconditioning  Mobility Bed Mobility Bed Mobility: Rolling Right;Rolling Left;Sit to Supine;Supine to Sit Rolling Right: Independent with assistive device Rolling Left: Independent with assistive device Supine to Sit: Independent with assistive device Sit to Supine: Independent with assistive device Transfers Transfers: Sit to Stand;Stand Pivot Transfers;Stand to Sit Sit to Stand: Supervision/Verbal cueing Stand to Sit: Supervision/Verbal cueing Stand Pivot Transfers: Supervision/Verbal cueing Stand Pivot Transfer Details: Verbal cues for safe use of DME/AE Stand Pivot Transfer Details (indicate cue type and reason): occasional cues for RW safety Transfer (Assistive device): Rolling walker Locomotion  Gait Ambulation: Yes Gait Assistance: Supervision/Verbal cueing Gait Distance (Feet): 150 Feet Assistive device: Rolling walker Gait Assistance Details: Verbal cues for precautions/safety Gait Assistance Details: occasional cues for RW safety and energy conservation techniques Gait Gait: Yes Gait Pattern: Impaired Gait Pattern: Step-through pattern;Decreased stride length;Decreased step length - right;Decreased step length - left;Trunk flexed;Poor foot clearance - left;Poor foot clearance - right Gait velocity: decreased Stairs / Additional Locomotion Stairs: Yes Stairs Assistance: Supervision/Verbal cueing Stair Management Technique: One rail Left Number of Stairs: 12 Height of Stairs: 6 Pick up small object from the floor assist level: Supervision/Verbal cueing Pick up small object from the floor assistive device: small cup using RW Wheelchair Mobility Wheelchair Mobility: Yes Wheelchair Assistance: Chartered loss adjuster: Both upper extremities Wheelchair Parts Management: Needs assistance Distance: >154ft  Trunk/Postural  Assessment  Cervical Assessment Cervical Assessment: Exceptions to [facility] (forward head) Thoracic Assessment Thoracic Assessment: Exceptions to [facility] (excessive kyphosis) Lumbar Assessment Lumbar Assessment: Exceptions to [facility] (posterior pelvic tilt) Postural Control Postural Control: Within Functional Limits  Balance Balance Balance Assessed: Yes Static Sitting Balance Static Sitting - Balance Support: Feet supported;Bilateral upper extremity supported Static Sitting - Level of Assistance: 7: Independent Dynamic Sitting Balance Dynamic Sitting - Balance Support: Feet supported;No upper extremity supported Dynamic Sitting - Level of Assistance: 6: Modified independent (Device/Increase time) Static Standing Balance Static Standing - Balance Support: Bilateral upper extremity supported (RW) Static Standing - Level of Assistance: 5: Stand by assistance (supervision) Dynamic Standing Balance Dynamic Standing - Balance Support: Bilateral upper extremity supported (RW) Dynamic Standing - Level of Assistance: 5: Stand by assistance (supervision) Extremity Assessment  RLE Assessment RLE Assessment: Exceptions to [facility] General Strength Comments: grossly 4+/5 proximal to distal LLE Assessment LLE Assessment: Exceptions to [facility] General Strength Comments: grossly 4+/5 proximal to distal   Alfonse Alpers PT, DPT  06/12/2021, 7:42 AM

## 2021-06-12 NOTE — Progress Notes (Signed)
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 He has O2 at home for night time, but now needs a portable O2 for home and going back and forth to HD treatments-[facility] and Sat [facility] in [place].

## 2021-06-13 LAB — CBC
HCT: 24.2 % — ABNORMAL LOW (ref 39.0–52.0)
Hemoglobin: 7.4 g/dL — ABNORMAL LOW (ref 13.0–17.0)
MCH: 30.5 pg (ref 26.0–34.0)
MCHC: 30.6 g/dL (ref 30.0–36.0)
MCV: 99.6 fL (ref 80.0–100.0)
Platelets: 241 10*3/uL (ref 150–400)
RBC: 2.43 MIL/uL — ABNORMAL LOW (ref 4.22–5.81)
RDW: 13.9 % (ref 11.5–15.5)
WBC: 10.2 10*3/uL (ref 4.0–10.5)
nRBC: 0 % (ref 0.0–0.2)

## 2021-06-13 LAB — RENAL FUNCTION PANEL
Albumin: 2.5 g/dL — ABNORMAL LOW (ref 3.5–5.0)
Anion gap: 8 (ref 5–15)
BUN: 19 mg/dL (ref 8–23)
CO2: 26 mmol/L (ref 22–32)
Calcium: 8.1 mg/dL — ABNORMAL LOW (ref 8.9–10.3)
Chloride: 99 mmol/L (ref 98–111)
Creatinine, Ser: 4.66 mg/dL — ABNORMAL HIGH (ref 0.61–1.24)
GFR, Estimated: 12 mL/min — ABNORMAL LOW (ref >60)
Glucose, Bld: 73 mg/dL (ref 70–99)
Phosphorus: 3.3 mg/dL (ref 2.5–4.6)
Potassium: 4.1 mmol/L (ref 3.5–5.1)
Sodium: 133 mmol/L — ABNORMAL LOW (ref 135–145)

## 2021-06-13 MED ORDER — HEPARIN SODIUM (PORCINE) 1000 UNIT/ML IJ SOLN
INTRAMUSCULAR | Status: AC
  Administered 2021-06-13: 1000 [IU]
  Filled 2021-06-13: qty 4

## 2021-06-13 NOTE — Procedures (Signed)
Patient was seen on dialysis and the procedure was supervised.  BFR 400  Via TDC BP is  148/53.   Patient appears to be tolerating treatment well  [name] [name] [name] [name] 06/13/2021

## 2021-06-13 NOTE — Progress Notes (Signed)
Wife was adamant not to bring home the oxygen provided for them.CN was made aware and O2 was placed at the nurses station. Per wife they have an oxygen at home that they can use once the patient is at home. She claims that patient will not need oxygen en route to home because they will be travelling a short distance. Wife was instructed to call [place] worker on Monday to follow up portable O2 that they are requesting. Social worker number provided and written on AVS. Wife requested to give nebulization treatment before discharge.

## 2021-06-13 NOTE — Progress Notes (Signed)
[facility] NEPHROLOGY PROGRESS NOTE  Assessment/ Plan:  # AKI/CKD stage IV, oliguric - presumably due to ischemic ATN due to volume depletion and concomitant use of ARB, aldactone, and jardiance.  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  Started on HD 05/28/21 with temp HD catheter. S/p TDC placement 06/02/21 w/ IR. Continue on TTS schedule for now and follow for renal recovery. No signs of renal recovery so far.  Outpatient HD arranged at [facility], TTS with schedule chair time 12:15 PM, to start from 9/13.   HD today, tolerating well. Plan for DC today noted.   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  After consulting with a cardiologist the Coumadin is stopped.  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  No ESA due to pelvic mass.  Hemoglobin is stable.  #A fib -off of coumadin because of bleeding.  Rate controlled with carvedilol.  # CKD-BMD : Phosphorus level improved.  Not on binders.  PTH level 23, not on VDRA.   Encourage oral intake.  #COPD: Continue bronchodilators.  Volume management as above.  Subjective: Seen and examined.  Tolerating dialysis well.  Denies nausea vomiting chest pain or shortness of breath.  He is eager to go home. Objective Vital signs in last 24 hours: Vitals:   06/13/21 0800 06/13/21 0830 06/13/21 0900 06/13/21 0930  BP: (!) 145/44 (!) 139/58 (!) 153/60 (!) 149/53  Pulse: 61 60 63 64  Resp:      Temp:      TempSrc:      SpO2:      Weight:      Height:       Weight change: -2.1 kg  Intake/Output Summary (Last 24 hours) at 06/13/2021 1004 Last data filed at 06/13/2021 0747 Gross per 24 hour  Intake 300 ml  Output --  Net 300 ml        Labs: Basic Metabolic Panel: Recent Labs  Lab 06/09/21 1340 06/11/21 0447 06/13/21 0806  NA 132* 135 133*  K 3.5 3.5 4.1  CL 98 97* 99   CO2 29 29 26   GLUCOSE 118* 91 73  BUN 13 14 19   CREATININE 2.79* 4.22* 4.66*  CALCIUM 8.0* 8.7* 8.1*  PHOS 1.6* 3.0 3.3    Liver Function Tests: Recent Labs  Lab 06/09/21 1340 06/11/21 0447 06/13/21 0806  ALBUMIN 2.4* 2.4* 2.5*    No results for input(s): LIPASE, AMYLASE in the last 168 hours. No results for input(s): AMMONIA in the last 168 hours. CBC: Recent Labs  Lab 06/06/21 1327 06/08/21 0816 06/09/21 1340 06/11/21 0447 06/13/21 0806  WBC 10.4 11.7* 7.9 9.5 10.2  HGB 8.7* 8.5* 8.2* 8.0* 7.4*  HCT 27.5* 26.4* 25.8* 25.8* 24.2*  MCV 97.2 96.4 97.4 98.1 99.6  PLT 249 226 215 244 241    Cardiac Enzymes: No results for input(s): CKTOTAL, CKMB, CKMBINDEX, TROPONINI in the last 168 hours. CBG: Recent Labs  Lab 06/06/21 1156  GLUCAP 88     Iron Studies: No results for input(s): IRON, TIBC, TRANSFERRIN, FERRITIN in the last 72 hours. Studies/Results: ECHOCARDIOGRAM COMPLETE  Result Date: 06/11/2021    ECHOCARDIOGRAM REPORT   Patient Name:   [name] [name] Date of Exam: 06/11/2021 Medical Rec #:  [id number]       Height:       67.0 in Accession #:  [id number]      Weight:       162.9 lb Date of Birth:  1941-05-04       BSA:          1.853 m Patient Age:    80 years        BP:           151/49 mmHg Patient Gender: M               HR:           61 bpm. Exam Location:  Inpatient Procedure: 2D Echo, Color Doppler and Cardiac Doppler Indications:    Z12.45 Acute systolic (congestive) heart failure  History:        Patient has prior history of Echocardiogram examinations, most                 recent 11/04/2020. CHF, Defibrillator, COPD, Arrythmias:Atrial                 Fibrillation; Risk Factors:Hypertension and Dyslipidemia.  Sonographer:    Darlina Sicilian RDCS Referring Phys: [place]  1. Left ventricular ejection fraction, by estimation, is 50 to 55%. The left ventricle has low normal function. The left ventricle has no regional wall motion  abnormalities. There is moderate left ventricular hypertrophy. Left ventricular diastolic parameters are indeterminate.  2. Right ventricular systolic function is mildly reduced. The right ventricular size is mildly enlarged. There is mildly elevated pulmonary artery systolic pressure.  3. Left atrial size was severely dilated.  4. The mitral valve is normal in structure. Mild mitral valve regurgitation. No evidence of mitral stenosis.  5. The aortic valve is tricuspid. Aortic valve regurgitation is not visualized. Mild to moderate aortic valve sclerosis/calcification is present, without any evidence of aortic stenosis. FINDINGS  Left Ventricle: Left ventricular ejection fraction, by estimation, is 50 to 55%. The left ventricle has low normal function. The left ventricle has no regional wall motion abnormalities. The left ventricular internal cavity size was normal in size. There is moderate left ventricular hypertrophy. Left ventricular diastolic parameters are indeterminate. Right Ventricle: The right ventricular size is mildly enlarged. Right vetricular wall thickness was not well visualized. Right ventricular systolic function is mildly reduced. There is mildly elevated pulmonary artery systolic pressure. The tricuspid regurgitant velocity is 2.89 m/s, and with an assumed right atrial pressure of 8 mmHg, the estimated right ventricular systolic pressure is 80.9 mmHg. Left Atrium: Left atrial size was severely dilated. Right Atrium: Right atrial size was normal in size. Pericardium: There is no evidence of pericardial effusion. Mitral Valve: The mitral valve is normal in structure. Mild mitral valve regurgitation. No evidence of mitral valve stenosis. Tricuspid Valve: The tricuspid valve is normal in structure. Tricuspid valve regurgitation is mild. Aortic Valve: The aortic valve is tricuspid. Aortic valve regurgitation is not visualized. Mild to moderate aortic valve sclerosis/calcification is present, without  any evidence of aortic stenosis. Pulmonic Valve: The pulmonic valve was grossly normal. Pulmonic valve regurgitation is trivial. Aorta: The aortic root is normal in size and structure. IAS/Shunts: The interatrial septum was not well visualized.  LEFT VENTRICLE PLAX 2D LVIDd:         5.50 cm LVIDs:         4.50 cm LV PW:         1.30 cm LV IVS:        1.10 cm LVOT diam:     2.10 cm LV SV:  55 LV SV Index:   30 LVOT Area:     3.46 cm  LV Volumes (MOD) LV vol d, MOD A2C: 101.0 ml LV vol d, MOD A4C: 129.0 ml LV vol s, MOD A2C: 45.7 ml LV vol s, MOD A4C: 73.7 ml LV SV MOD A2C:     55.4 ml LV SV MOD A4C:     129.0 ml LV SV MOD BP:      60.1 ml RIGHT VENTRICLE RV S prime:     6.42 cm/s TAPSE (M-mode): 1.6 cm LEFT ATRIUM             Index       RIGHT ATRIUM           Index LA diam:        6.30 cm 3.40 cm/m  RA Area:     17.50 cm LA Vol (A2C):   84.7 ml 45.70 ml/m RA Volume:   41.90 ml  22.61 ml/m LA Vol (A4C):   86.8 ml 46.83 ml/m LA Biplane Vol: 90.2 ml 48.67 ml/m  AORTIC VALVE LVOT Vmax:   71.10 cm/s LVOT Vmean:  40.600 cm/s LVOT VTI:    0.159 m  AORTA Ao Root diam: 3.50 cm Ao Asc diam:  3.40 cm MITRAL VALVE               TRICUSPID VALVE MV Area (PHT): 3.96 cm    TR Peak grad:   33.4 mmHg MV Decel Time: 192 msec    TR Vmax:        289.00 cm/s MV E velocity: 86.10 cm/s                            SHUNTS                            Systemic VTI:  0.16 m                            Systemic Diam: 2.10 cm Oswaldo Milian MD Electronically signed by Oswaldo Milian MD Signature Date/Time: 06/11/2021/12:13:35 PM    Final     Medications: Infusions:   Scheduled Medications:  amLODipine  10 mg Oral Daily   budesonide  0.25 mg Nebulization BID   Chlorhexidine Gluconate Cloth  6 each Topical Q0600   Chlorhexidine Gluconate Cloth  6 each Topical Q0600   feeding supplement (NEPRO CARB STEADY)  237 mL Oral TID BM   ipratropium-albuterol  3 mL Nebulization BID   levothyroxine  112 mcg Oral Q0600    multivitamin  1 tablet Oral QHS   pantoprazole  40 mg Oral BID   pravastatin  40 mg Oral q1800    have reviewed scheduled and prn medications.  Physical Exam: General: Tolerating dialysis well, not in distress, comfortable looking Heart:RRR, s1s2 nl Lungs: Clear anteriorly, no wheezing Abdomen:soft, Non-tender, non-distended Extremities:No edema Dialysis Access: IJ TDC in place  Railyn House Prasad Janeene Sand 06/13/2021,10:04 AM  LOS: 8 days

## 2021-06-13 NOTE — Progress Notes (Signed)
Patient discharged to home per wheelchair accompanied by NT and wife; IV  removed ; HD cath intact. Patient preferred not to have 02 on en route. No further questions noted.

## 2021-06-14 ENCOUNTER — Telehealth: Payer: Self-pay | Admitting: Nephrology

## 2021-06-14 NOTE — Telephone Encounter (Signed)
Transition of Care Contact from [place]   Date of Discharge: 06/13/21 Date of Contact: 06/14/21 Method of contact: phone Talked to patient's wife   Patient contacted to discuss transition of care form recent hospitaliztion. Patient was admitted to [facility] from 05/22/21-06/05/21 with the discharge diagnosis of dialysis dependent AKI, hematuria, hypoglycemia and then CIR admission 06/05/21 to 06/13/21 with the discharge diagnosis of debility.      Medication changes were reviewed.   Patient will follow up with is outpatient dialysis center 06/16/21.   Other follow up needs include none identified.    Jen Mow, PA-C [employer]

## 2021-06-15 ENCOUNTER — Telehealth (HOSPITAL_COMMUNITY): Payer: Self-pay | Admitting: *Deleted

## 2021-06-15 DIAGNOSIS — N186 End stage renal disease: Secondary | ICD-10-CM | POA: Diagnosis not present

## 2021-06-15 DIAGNOSIS — D631 Anemia in chronic kidney disease: Secondary | ICD-10-CM | POA: Diagnosis not present

## 2021-06-15 DIAGNOSIS — J9611 Chronic respiratory failure with hypoxia: Secondary | ICD-10-CM | POA: Diagnosis not present

## 2021-06-15 DIAGNOSIS — I4821 Permanent atrial fibrillation: Secondary | ICD-10-CM | POA: Diagnosis not present

## 2021-06-15 DIAGNOSIS — I6529 Occlusion and stenosis of unspecified carotid artery: Secondary | ICD-10-CM | POA: Diagnosis not present

## 2021-06-15 DIAGNOSIS — I2510 Atherosclerotic heart disease of native coronary artery without angina pectoris: Secondary | ICD-10-CM | POA: Diagnosis not present

## 2021-06-15 DIAGNOSIS — Z992 Dependence on renal dialysis: Secondary | ICD-10-CM | POA: Diagnosis not present

## 2021-06-15 DIAGNOSIS — M1030 Gout due to renal impairment, unspecified site: Secondary | ICD-10-CM | POA: Diagnosis not present

## 2021-06-15 DIAGNOSIS — I739 Peripheral vascular disease, unspecified: Secondary | ICD-10-CM | POA: Diagnosis not present

## 2021-06-15 DIAGNOSIS — I5042 Chronic combined systolic (congestive) and diastolic (congestive) heart failure: Secondary | ICD-10-CM | POA: Diagnosis not present

## 2021-06-15 DIAGNOSIS — N179 Acute kidney failure, unspecified: Secondary | ICD-10-CM | POA: Diagnosis not present

## 2021-06-15 DIAGNOSIS — M159 Polyosteoarthritis, unspecified: Secondary | ICD-10-CM | POA: Diagnosis not present

## 2021-06-15 DIAGNOSIS — I132 Hypertensive heart and chronic kidney disease with heart failure and with stage 5 chronic kidney disease, or end stage renal disease: Secondary | ICD-10-CM | POA: Diagnosis not present

## 2021-06-15 DIAGNOSIS — Z951 Presence of aortocoronary bypass graft: Secondary | ICD-10-CM | POA: Diagnosis not present

## 2021-06-15 DIAGNOSIS — K219 Gastro-esophageal reflux disease without esophagitis: Secondary | ICD-10-CM | POA: Diagnosis not present

## 2021-06-15 DIAGNOSIS — Z7951 Long term (current) use of inhaled steroids: Secondary | ICD-10-CM | POA: Diagnosis not present

## 2021-06-15 DIAGNOSIS — I701 Atherosclerosis of renal artery: Secondary | ICD-10-CM | POA: Diagnosis not present

## 2021-06-15 DIAGNOSIS — E440 Moderate protein-calorie malnutrition: Secondary | ICD-10-CM | POA: Diagnosis not present

## 2021-06-15 DIAGNOSIS — E785 Hyperlipidemia, unspecified: Secondary | ICD-10-CM | POA: Diagnosis not present

## 2021-06-15 DIAGNOSIS — I5022 Chronic systolic (congestive) heart failure: Secondary | ICD-10-CM | POA: Diagnosis not present

## 2021-06-15 DIAGNOSIS — J449 Chronic obstructive pulmonary disease, unspecified: Secondary | ICD-10-CM | POA: Diagnosis not present

## 2021-06-15 DIAGNOSIS — Z9981 Dependence on supplemental oxygen: Secondary | ICD-10-CM | POA: Diagnosis not present

## 2021-06-15 DIAGNOSIS — E8809 Other disorders of plasma-protein metabolism, not elsewhere classified: Secondary | ICD-10-CM | POA: Diagnosis not present

## 2021-06-15 DIAGNOSIS — E039 Hypothyroidism, unspecified: Secondary | ICD-10-CM | POA: Diagnosis not present

## 2021-06-15 DIAGNOSIS — D62 Acute posthemorrhagic anemia: Secondary | ICD-10-CM | POA: Diagnosis not present

## 2021-06-15 DIAGNOSIS — R5381 Other malaise: Secondary | ICD-10-CM | POA: Diagnosis not present

## 2021-06-15 NOTE — Telephone Encounter (Signed)
Received fax from [facility], pt needs clearance for procedure and to stop coumadin 5 days prior to Diagnostic R ureteroscopy and stent placement  Per Dr Aundra Dubin: Warfarin currently on hold-->ok for urology procedure  Note faxed back to [phone]

## 2021-06-16 DIAGNOSIS — Z992 Dependence on renal dialysis: Secondary | ICD-10-CM | POA: Diagnosis not present

## 2021-06-16 DIAGNOSIS — Z23 Encounter for immunization: Secondary | ICD-10-CM | POA: Diagnosis not present

## 2021-06-16 DIAGNOSIS — N2581 Secondary hyperparathyroidism of renal origin: Secondary | ICD-10-CM | POA: Diagnosis not present

## 2021-06-16 DIAGNOSIS — D689 Coagulation defect, unspecified: Secondary | ICD-10-CM | POA: Diagnosis not present

## 2021-06-16 DIAGNOSIS — D649 Anemia, unspecified: Secondary | ICD-10-CM | POA: Diagnosis not present

## 2021-06-16 DIAGNOSIS — R52 Pain, unspecified: Secondary | ICD-10-CM | POA: Diagnosis not present

## 2021-06-16 DIAGNOSIS — N178 Other acute kidney failure: Secondary | ICD-10-CM | POA: Diagnosis not present

## 2021-06-17 DIAGNOSIS — M1030 Gout due to renal impairment, unspecified site: Secondary | ICD-10-CM | POA: Diagnosis not present

## 2021-06-17 DIAGNOSIS — I739 Peripheral vascular disease, unspecified: Secondary | ICD-10-CM | POA: Diagnosis not present

## 2021-06-17 DIAGNOSIS — K219 Gastro-esophageal reflux disease without esophagitis: Secondary | ICD-10-CM | POA: Diagnosis not present

## 2021-06-17 DIAGNOSIS — D62 Acute posthemorrhagic anemia: Secondary | ICD-10-CM | POA: Diagnosis not present

## 2021-06-17 DIAGNOSIS — E039 Hypothyroidism, unspecified: Secondary | ICD-10-CM | POA: Diagnosis not present

## 2021-06-17 DIAGNOSIS — E8809 Other disorders of plasma-protein metabolism, not elsewhere classified: Secondary | ICD-10-CM | POA: Diagnosis not present

## 2021-06-17 DIAGNOSIS — I2510 Atherosclerotic heart disease of native coronary artery without angina pectoris: Secondary | ICD-10-CM | POA: Diagnosis not present

## 2021-06-17 DIAGNOSIS — Z951 Presence of aortocoronary bypass graft: Secondary | ICD-10-CM | POA: Diagnosis not present

## 2021-06-17 DIAGNOSIS — J9611 Chronic respiratory failure with hypoxia: Secondary | ICD-10-CM | POA: Diagnosis not present

## 2021-06-17 DIAGNOSIS — I132 Hypertensive heart and chronic kidney disease with heart failure and with stage 5 chronic kidney disease, or end stage renal disease: Secondary | ICD-10-CM | POA: Diagnosis not present

## 2021-06-17 DIAGNOSIS — I5042 Chronic combined systolic (congestive) and diastolic (congestive) heart failure: Secondary | ICD-10-CM | POA: Diagnosis not present

## 2021-06-17 DIAGNOSIS — J449 Chronic obstructive pulmonary disease, unspecified: Secondary | ICD-10-CM | POA: Diagnosis not present

## 2021-06-17 DIAGNOSIS — E785 Hyperlipidemia, unspecified: Secondary | ICD-10-CM | POA: Diagnosis not present

## 2021-06-17 DIAGNOSIS — I701 Atherosclerosis of renal artery: Secondary | ICD-10-CM | POA: Diagnosis not present

## 2021-06-17 DIAGNOSIS — N186 End stage renal disease: Secondary | ICD-10-CM | POA: Diagnosis not present

## 2021-06-17 DIAGNOSIS — M159 Polyosteoarthritis, unspecified: Secondary | ICD-10-CM | POA: Diagnosis not present

## 2021-06-17 DIAGNOSIS — R5381 Other malaise: Secondary | ICD-10-CM | POA: Diagnosis not present

## 2021-06-17 DIAGNOSIS — I4821 Permanent atrial fibrillation: Secondary | ICD-10-CM | POA: Diagnosis not present

## 2021-06-17 DIAGNOSIS — I6529 Occlusion and stenosis of unspecified carotid artery: Secondary | ICD-10-CM | POA: Diagnosis not present

## 2021-06-17 DIAGNOSIS — Z992 Dependence on renal dialysis: Secondary | ICD-10-CM | POA: Diagnosis not present

## 2021-06-17 DIAGNOSIS — Z7951 Long term (current) use of inhaled steroids: Secondary | ICD-10-CM | POA: Diagnosis not present

## 2021-06-17 DIAGNOSIS — E440 Moderate protein-calorie malnutrition: Secondary | ICD-10-CM | POA: Diagnosis not present

## 2021-06-17 DIAGNOSIS — N179 Acute kidney failure, unspecified: Secondary | ICD-10-CM | POA: Diagnosis not present

## 2021-06-17 DIAGNOSIS — D631 Anemia in chronic kidney disease: Secondary | ICD-10-CM | POA: Diagnosis not present

## 2021-06-17 DIAGNOSIS — Z9981 Dependence on supplemental oxygen: Secondary | ICD-10-CM | POA: Diagnosis not present

## 2021-06-18 DIAGNOSIS — I509 Heart failure, unspecified: Secondary | ICD-10-CM | POA: Diagnosis not present

## 2021-06-18 DIAGNOSIS — D689 Coagulation defect, unspecified: Secondary | ICD-10-CM | POA: Diagnosis not present

## 2021-06-18 DIAGNOSIS — N178 Other acute kidney failure: Secondary | ICD-10-CM | POA: Diagnosis not present

## 2021-06-18 DIAGNOSIS — D649 Anemia, unspecified: Secondary | ICD-10-CM | POA: Diagnosis not present

## 2021-06-18 DIAGNOSIS — I130 Hypertensive heart and chronic kidney disease with heart failure and stage 1 through stage 4 chronic kidney disease, or unspecified chronic kidney disease: Secondary | ICD-10-CM | POA: Diagnosis not present

## 2021-06-18 DIAGNOSIS — E875 Hyperkalemia: Secondary | ICD-10-CM | POA: Diagnosis not present

## 2021-06-18 DIAGNOSIS — N2581 Secondary hyperparathyroidism of renal origin: Secondary | ICD-10-CM | POA: Diagnosis not present

## 2021-06-18 DIAGNOSIS — Z23 Encounter for immunization: Secondary | ICD-10-CM | POA: Diagnosis not present

## 2021-06-18 DIAGNOSIS — Z992 Dependence on renal dialysis: Secondary | ICD-10-CM | POA: Diagnosis not present

## 2021-06-18 DIAGNOSIS — R52 Pain, unspecified: Secondary | ICD-10-CM | POA: Diagnosis not present

## 2021-06-18 DIAGNOSIS — N179 Acute kidney failure, unspecified: Secondary | ICD-10-CM | POA: Diagnosis not present

## 2021-06-19 ENCOUNTER — Ambulatory Visit (HOSPITAL_COMMUNITY)
Admission: RE | Admit: 2021-06-19 | Discharge: 2021-06-19 | Disposition: A | Discharge status: Home / Self Care | Payer: Medicare Other | Source: Ambulatory Visit | Attending: Family Medicine | Admitting: Family Medicine

## 2021-06-19 ENCOUNTER — Encounter (HOSPITAL_COMMUNITY): Payer: Self-pay

## 2021-06-19 ENCOUNTER — Other Ambulatory Visit: Payer: Self-pay

## 2021-06-19 ENCOUNTER — Other Ambulatory Visit: Payer: Self-pay | Admitting: Family Medicine

## 2021-06-19 VITALS — BP 122/68 | HR 67 | Wt 155.6 lb

## 2021-06-19 DIAGNOSIS — Z79899 Other long term (current) drug therapy: Secondary | ICD-10-CM | POA: Insufficient documentation

## 2021-06-19 DIAGNOSIS — Z951 Presence of aortocoronary bypass graft: Secondary | ICD-10-CM | POA: Insufficient documentation

## 2021-06-19 DIAGNOSIS — I482 Chronic atrial fibrillation, unspecified: Secondary | ICD-10-CM | POA: Diagnosis not present

## 2021-06-19 DIAGNOSIS — I130 Hypertensive heart and chronic kidney disease with heart failure and stage 1 through stage 4 chronic kidney disease, or unspecified chronic kidney disease: Secondary | ICD-10-CM | POA: Diagnosis not present

## 2021-06-19 DIAGNOSIS — Z7951 Long term (current) use of inhaled steroids: Secondary | ICD-10-CM | POA: Insufficient documentation

## 2021-06-19 DIAGNOSIS — I5022 Chronic systolic (congestive) heart failure: Secondary | ICD-10-CM | POA: Diagnosis not present

## 2021-06-19 DIAGNOSIS — I255 Ischemic cardiomyopathy: Secondary | ICD-10-CM | POA: Diagnosis not present

## 2021-06-19 DIAGNOSIS — R319 Hematuria, unspecified: Secondary | ICD-10-CM

## 2021-06-19 DIAGNOSIS — I739 Peripheral vascular disease, unspecified: Secondary | ICD-10-CM | POA: Insufficient documentation

## 2021-06-19 DIAGNOSIS — J449 Chronic obstructive pulmonary disease, unspecified: Secondary | ICD-10-CM | POA: Insufficient documentation

## 2021-06-19 DIAGNOSIS — Z79890 Hormone replacement therapy (postmenopausal): Secondary | ICD-10-CM | POA: Insufficient documentation

## 2021-06-19 DIAGNOSIS — Z8249 Family history of ischemic heart disease and other diseases of the circulatory system: Secondary | ICD-10-CM | POA: Diagnosis not present

## 2021-06-19 DIAGNOSIS — R34 Anuria and oliguria: Secondary | ICD-10-CM | POA: Diagnosis not present

## 2021-06-19 DIAGNOSIS — Z87891 Personal history of nicotine dependence: Secondary | ICD-10-CM | POA: Diagnosis not present

## 2021-06-19 DIAGNOSIS — R200 Anesthesia of skin: Secondary | ICD-10-CM | POA: Insufficient documentation

## 2021-06-19 DIAGNOSIS — N184 Chronic kidney disease, stage 4 (severe): Secondary | ICD-10-CM | POA: Diagnosis not present

## 2021-06-19 DIAGNOSIS — Z992 Dependence on renal dialysis: Secondary | ICD-10-CM | POA: Insufficient documentation

## 2021-06-19 DIAGNOSIS — I2510 Atherosclerotic heart disease of native coronary artery without angina pectoris: Secondary | ICD-10-CM | POA: Insufficient documentation

## 2021-06-19 DIAGNOSIS — I6523 Occlusion and stenosis of bilateral carotid arteries: Secondary | ICD-10-CM | POA: Diagnosis not present

## 2021-06-19 DIAGNOSIS — Z7901 Long term (current) use of anticoagulants: Secondary | ICD-10-CM | POA: Diagnosis not present

## 2021-06-19 LAB — BASIC METABOLIC PANEL
Anion gap: 10 (ref 5–15)
BUN: 10 mg/dL (ref 8–23)
CO2: 28 mmol/L (ref 22–32)
Calcium: 8.9 mg/dL (ref 8.9–10.3)
Chloride: 94 mmol/L — ABNORMAL LOW (ref 98–111)
Creatinine, Ser: 3.02 mg/dL — ABNORMAL HIGH (ref 0.61–1.24)
GFR, Estimated: 20 mL/min — ABNORMAL LOW (ref >60)
Glucose, Bld: 94 mg/dL (ref 70–99)
Potassium: 3.5 mmol/L (ref 3.5–5.1)
Sodium: 132 mmol/L — ABNORMAL LOW (ref 135–145)

## 2021-06-19 LAB — CBC
HCT: 27.1 % — ABNORMAL LOW (ref 39.0–52.0)
Hemoglobin: 8.6 g/dL — ABNORMAL LOW (ref 13.0–17.0)
MCH: 30.7 pg (ref 26.0–34.0)
MCHC: 31.7 g/dL (ref 30.0–36.0)
MCV: 96.8 fL (ref 80.0–100.0)
Platelets: 262 10*3/uL (ref 150–400)
RBC: 2.8 MIL/uL — ABNORMAL LOW (ref 4.22–5.81)
RDW: 14.3 % (ref 11.5–15.5)
WBC: 11 10*3/uL — ABNORMAL HIGH (ref 4.0–10.5)
nRBC: 0 % (ref 0.0–0.2)

## 2021-06-19 NOTE — Patient Instructions (Addendum)
EKG was performed today  Labs were done today, if any labs come back abnormal the clinic will call you  Your physician recommends that you schedule a follow-up appointment in: as scheduled 07/09/2021  At the [facility], you and your health needs are our priority. As part of our continuing mission to provide you with exceptional heart care, we have created designated Provider Care Teams. These Care Teams include your primary Cardiologist (physician) and Advanced Practice Providers (APPs- Physician Assistants and Nurse Practitioners) who all work together to provide you with the care you need, when you need it.   You may see any of the following providers on your designated Care Team at your next follow up: Dr Glori Bickers Dr Loralie Champagne Dr Patrice Paradise, NP Lyda Jester, [place] Ginnie Smart Audry Riles, PharmD   Please be sure to bring in all your medications bottles to every appointment.    If you have any questions or concerns before your next appointment please send [place] a message through [place] or call our office at [phone].    TO LEAVE A MESSAGE FOR THE NURSE SELECT OPTION 2, PLEASE LEAVE A MESSAGE INCLUDING: YOUR NAME DATE OF BIRTH CALL BACK NUMBER REASON FOR CALL**this is important as we prioritize the call backs  YOU WILL RECEIVE A CALL BACK THE SAME DAY AS LONG AS YOU CALL BEFORE 4:00 PM

## 2021-06-19 NOTE — Progress Notes (Signed)
PCP: Dr. Livia Snellen Cardiology: Dr. Tamala Julian HF Cardiology: Dr. Aundra Dubin  80 y.o. with chronic atrial fibrillation, CAD s/p CABG, and chronic systolic CHF presents for CHF clinic followup.  Patient had CABG in 1998.  He has had chronic atrial fibrillation long-term.  Last echo in 6/17 showed EF 25-30%.  Most recent cardiac cath was in 10/17.  This showed occluded native vessels with occluded SVG-dLAD and SVG-RCA.  SVG-OM and LIMA-LAD were patent (LIMA had stable 85% ostial stenosis).  No interventional options, medically managed.  Cardiac output was preserved, filling pressures were mildly elevated.    Echo in 6/18 showed EF 30-35%.  Patient had [place] ICD placed in 7/18 with His bundle lead. Most recent echo in 11/19 showed EF up to 55-60%.   PFTs in 2/20 showed severe obstruction.   AHF follow up 1/22 his dyspnea was out of proportion to cardiac/exam findings, suspected COPD was main driver of dyspnea. Lasix stopped and Jardiance started.  Presented 05/22/2021 with recent UTI progressive weakness and fatigue. BP in ED 181/59 BUN 91 creatinine 10.49 from baseline 2.5 to.  Nephrology consulted for severe AKI on CKD with hemodialysis initiated on T/Th/Sat schedule. Cardiology consulted to assist with CHF and coumadin recommendations. GDMT stopped with CKD IV and initiation of HD. Coumadin was held due to gross hematuria and need for further evaluation of right renal pelvic mass. He was discharged to CIR.   Today he returns for post hospitalization HF follow up with his wife. He feels better now that he is home. Remains dyspneic with any activity, but no more than usual. Walks with a walker at home but no falls. Receiving PT a couple times a week. Denies CP, dizziness, edema, or PND/Orthopnea. Appetite ok. No fever or chills. Taking all medications.    ECG (personally reviewed): v-pacing  Device Interrogation (personally reviewed):  HL Score 0, daily activity 0.3 hrs, no AT/AF  Labs (10/17): hgb  10.2, K 4.1, creatinine 1.3 Labs (11/17): K 4.3, creatinine 1.5, BNP 638, LDL 87 Labs (2/18): K 4.1, creatinine 1.16, BNP 589 Labs (3/18): BNP 947, K 4.3, creatinine 1.09 Labs (10/19): LDL 49 Labs (1/20): K 4.5, creatinine 1.78, hgb 11.7 Labs (12/21) : LDL 41, HDL 41, K 4.7, creatinine 2.05 Labs (9/22): K 4.1, creatinine 4.66  PMH: 1. Gout 2. Hyperlipidemia 3. Lower GI bleed: Colonic AVMs. 4. Atrial fibrillation: Chronic 5. HTN 6. 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  - PFTs (2/20): FVC 62%, FEV1 44%, TLC 106% => severe obstructive airways disease.  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 CAD: CABG x 4 in 1998. - LHC (10/17) with total occlusion SVG-dLAD, total occlusion SVG-RCA (new), patent SVG-OM, 85% ostial LIMA-LAD (unchanged), total occlusion of native LAD, LCx, and RCA.  No interventional option.  11. Chronic systolic CHF: Ischemic cardiomyopathy.   - Echo (6/17) with EF 25-30%, mild MR, mildly dilated RV with mildly decreased systolic function.   - RHC (10/17): mean RA 11, PA 49/14, mean PCWP 17, CI 3.84.  - Echo (6/18): EF 30-35%.  - [place] ICD with His bundle lead 7/18.  - Echo (11/19): EF 55-60%, mild asymmetric septal hypertrophy.  12. Sinus bradycardia 13. Carotid disease: 11/19 carotid dopplers with 60-79% BICA stenosis.  - Carotid dopplers (7/21): 60-79% BICA stenosis.  14. Peripheral neuropathy  SH: Married, quit smoking 1998, retired from [employer], lives in [place].    Family History  Problem Relation Age of Onset   Leukemia Mother  Kidney disease Mother        kidney removed    Heart attack Brother    Heart disease Brother    Heart disease Father    Stomach cancer Sister    Stomach cancer Sister    Early death Brother    Hypertension Other        family    Colon cancer Neg Hx    Review of systems complete and found to be negative unless listed in HPI.    Current Outpatient Medications   Medication Sig Dispense Refill   acetaminophen (TYLENOL) 325 MG tablet Take 2 tablets (650 mg total) by mouth every 6 (six) hours as needed for mild pain, fever or headache.     albuterol (VENTOLIN HFA) 108 (90 Base) MCG/ACT inhaler Inhale 2 puffs into the lungs every 6 (six) hours as needed for shortness of breath. 6.7 g 1   amLODipine (NORVASC) 10 MG tablet Take 1 tablet (10 mg total) by mouth daily. 30 tablet 0   budesonide (PULMICORT) 0.25 MG/2ML nebulizer solution USE 1 VIAL  IN  NEBULIZER TWICE  DAILY - rinse mouth after treatment (Patient taking differently: Take 0.25 mg by nebulization See admin instructions. USE 1 VIAL  IN  NEBULIZER TWICE DAILY - rinse mouth after treatment) 6 mL 5   Cholecalciferol (VITAMIN D-3) 125 MCG (5000 UT) TABS Take 125 mcg by mouth daily.     Cholecalciferol (VITAMIN D3) 125 MCG (5000 UT) CAPS Take 1 capsule (5,000 Units total) by mouth daily. 30 capsule 0   ipratropium-albuterol (DUONEB) 0.5-2.5 (3) MG/3ML SOLN USE 1 VIAL IN NEBULIZER 4 TIMES DAILY (Patient taking differently: Take 3 mLs by nebulization 4 (four) times daily.) 9 mL 5   levothyroxine (SYNTHROID) 112 MCG tablet Take 1 tablet (112 mcg total) by mouth daily before breakfast. 90 tablet 3   loratadine (CLARITIN) 10 MG tablet Take 10 mg by mouth daily as needed (for allergies.).      multivitamin (RENA-VIT) TABS tablet Take 1 tablet by mouth at bedtime. 30 tablet 0   nitroGLYCERIN (NITROSTAT) 0.4 MG SL tablet Place 1 tablet (0.4 mg total) under the tongue every 5 (five) minutes as needed for chest pain. 6 tablet 1   pravastatin (PRAVACHOL) 40 MG tablet Take 1 tablet (40 mg total) by mouth daily at 6 PM. 30 tablet 0   No current facility-administered medications for this encounter.   BP 122/68   Pulse 67   Wt 70.6 kg   SpO2 96%   BMI 24.37 kg/m    Wt Readings from Last 3 Encounters:  06/19/21 70.6 kg  06/13/21 67.5 kg  06/04/21 81.6 kg    General:  NAD. No resp difficulty, chronically-ill  appearing, arrived in [facility] HEENT: Normal Neck: Supple. No JVD. Carotids 2+ bilat; no bruits. No lymphadenopathy or thryomegaly appreciated. Cor: PMI nondisplaced. Regular rate & rhythm. No rubs, gallops or murmurs. Lungs: Clear, +TDC Abdomen: Soft, nontender, nondistended. No hepatosplenomegaly. No bruits or masses. Good bowel sounds. Extremities: No cyanosis, clubbing, rash, edema Neuro: Alert & oriented x 3, cranial nerves grossly intact. Moves all 4 extremities w/o difficulty. Affect pleasant.  Assessment/Plan: 1. CAD: s/p CABG 1998.  Occluded native vessels, patent SVG-OM and LIMA-LAD (chronic 85% ostial LIMA stenosis).  No chest pain.  No good interventional options.  - Continue statin, LDL acceptable at last check.  2. Chronic systolic CHF: Ischemic cardiomyopathy.  Echo (6/17) with EF 25-30%, mildly dilated RV with decreased RV systolic function. [place]  with His bundle lead placed, 93% His pacing today.  Echo in 11/19 with EF up to 55-60%. Echo (9/22) EF 50-55%. NYHA class III symptoms, chronic.  Not volume overloaded by exam.  I suspect that COPD is the major cause of his dyspnea.   - Off beta blocker since discharge. With BP & HR controlled today, will not restart today. - No Entresto/spiro/SGLT2i with CKD IV.  3. Atrial fibrillation: Chronic.  Unlikely to cardiovert with chronicity.  - He is off Coumadin due to hematuria currently.  4. CKD stage IV: Oliguric. Started on HD 05/28/21. Now TTS. 5. COPD: Severe obstruction on 2/20 PFTs.  As above, I think that his is likely the main cause of his dyspnea.  6. PAD: Difficult to palpate pedal pulses, numbness in feet but no classic claudication.  - ABI (2/22): showed noncompressible vessels bilaterally with abnormal TBI. - Continue medical therapy. Followed by Dr. Fletcher Anon. 7. Carotid stenosis: Carotid dopplers 7/22 show progression of disease  bilaterally with critical stenosis in bilateral arteries (greater than 80%). - Followed  by [facility]. Previously planned TCAR with DAPT with ASA + Plavix for 1 month post procedure, however with recent hospitalization and initiation of HD, this has not occurred. 8. Hematuria: Resolving, per patient. Scheduled for diagnostic right ureteroscopy and stent placement with Urology. - Check CBC today.  Followup with Dr. Aundra Dubin as scheduled.  Antoine FNP 06/19/2021

## 2021-06-20 DIAGNOSIS — N178 Other acute kidney failure: Secondary | ICD-10-CM | POA: Diagnosis not present

## 2021-06-20 DIAGNOSIS — N2581 Secondary hyperparathyroidism of renal origin: Secondary | ICD-10-CM | POA: Diagnosis not present

## 2021-06-20 DIAGNOSIS — R52 Pain, unspecified: Secondary | ICD-10-CM | POA: Diagnosis not present

## 2021-06-20 DIAGNOSIS — D689 Coagulation defect, unspecified: Secondary | ICD-10-CM | POA: Diagnosis not present

## 2021-06-20 DIAGNOSIS — Z992 Dependence on renal dialysis: Secondary | ICD-10-CM | POA: Diagnosis not present

## 2021-06-20 DIAGNOSIS — Z23 Encounter for immunization: Secondary | ICD-10-CM | POA: Diagnosis not present

## 2021-06-20 DIAGNOSIS — D649 Anemia, unspecified: Secondary | ICD-10-CM | POA: Diagnosis not present

## 2021-06-22 DIAGNOSIS — J45998 Other asthma: Secondary | ICD-10-CM | POA: Diagnosis not present

## 2021-06-22 DIAGNOSIS — Z992 Dependence on renal dialysis: Secondary | ICD-10-CM | POA: Diagnosis not present

## 2021-06-22 DIAGNOSIS — E039 Hypothyroidism, unspecified: Secondary | ICD-10-CM | POA: Diagnosis not present

## 2021-06-22 DIAGNOSIS — M159 Polyosteoarthritis, unspecified: Secondary | ICD-10-CM | POA: Diagnosis not present

## 2021-06-22 DIAGNOSIS — N186 End stage renal disease: Secondary | ICD-10-CM | POA: Diagnosis not present

## 2021-06-22 DIAGNOSIS — J449 Chronic obstructive pulmonary disease, unspecified: Secondary | ICD-10-CM | POA: Diagnosis not present

## 2021-06-22 DIAGNOSIS — M1030 Gout due to renal impairment, unspecified site: Secondary | ICD-10-CM | POA: Diagnosis not present

## 2021-06-22 DIAGNOSIS — D62 Acute posthemorrhagic anemia: Secondary | ICD-10-CM | POA: Diagnosis not present

## 2021-06-22 DIAGNOSIS — K219 Gastro-esophageal reflux disease without esophagitis: Secondary | ICD-10-CM | POA: Diagnosis not present

## 2021-06-22 DIAGNOSIS — E785 Hyperlipidemia, unspecified: Secondary | ICD-10-CM | POA: Diagnosis not present

## 2021-06-22 DIAGNOSIS — E440 Moderate protein-calorie malnutrition: Secondary | ICD-10-CM | POA: Diagnosis not present

## 2021-06-22 DIAGNOSIS — I6529 Occlusion and stenosis of unspecified carotid artery: Secondary | ICD-10-CM | POA: Diagnosis not present

## 2021-06-22 DIAGNOSIS — I4821 Permanent atrial fibrillation: Secondary | ICD-10-CM | POA: Diagnosis not present

## 2021-06-22 DIAGNOSIS — Z9981 Dependence on supplemental oxygen: Secondary | ICD-10-CM | POA: Diagnosis not present

## 2021-06-22 DIAGNOSIS — Z951 Presence of aortocoronary bypass graft: Secondary | ICD-10-CM | POA: Diagnosis not present

## 2021-06-22 DIAGNOSIS — J9611 Chronic respiratory failure with hypoxia: Secondary | ICD-10-CM | POA: Diagnosis not present

## 2021-06-22 DIAGNOSIS — I132 Hypertensive heart and chronic kidney disease with heart failure and with stage 5 chronic kidney disease, or end stage renal disease: Secondary | ICD-10-CM | POA: Diagnosis not present

## 2021-06-22 DIAGNOSIS — I701 Atherosclerosis of renal artery: Secondary | ICD-10-CM | POA: Diagnosis not present

## 2021-06-22 DIAGNOSIS — I5042 Chronic combined systolic (congestive) and diastolic (congestive) heart failure: Secondary | ICD-10-CM | POA: Diagnosis not present

## 2021-06-22 DIAGNOSIS — Z7951 Long term (current) use of inhaled steroids: Secondary | ICD-10-CM | POA: Diagnosis not present

## 2021-06-22 DIAGNOSIS — I739 Peripheral vascular disease, unspecified: Secondary | ICD-10-CM | POA: Diagnosis not present

## 2021-06-22 DIAGNOSIS — E8809 Other disorders of plasma-protein metabolism, not elsewhere classified: Secondary | ICD-10-CM | POA: Diagnosis not present

## 2021-06-22 DIAGNOSIS — N179 Acute kidney failure, unspecified: Secondary | ICD-10-CM | POA: Diagnosis not present

## 2021-06-22 DIAGNOSIS — D631 Anemia in chronic kidney disease: Secondary | ICD-10-CM | POA: Diagnosis not present

## 2021-06-22 DIAGNOSIS — I2510 Atherosclerotic heart disease of native coronary artery without angina pectoris: Secondary | ICD-10-CM | POA: Diagnosis not present

## 2021-06-22 DIAGNOSIS — R5381 Other malaise: Secondary | ICD-10-CM | POA: Diagnosis not present

## 2021-06-23 ENCOUNTER — Other Ambulatory Visit: Payer: Self-pay

## 2021-06-23 ENCOUNTER — Ambulatory Visit (INDEPENDENT_AMBULATORY_CARE_PROVIDER_SITE_OTHER): Payer: Medicare Other

## 2021-06-23 DIAGNOSIS — E8809 Other disorders of plasma-protein metabolism, not elsewhere classified: Secondary | ICD-10-CM

## 2021-06-23 DIAGNOSIS — R5381 Other malaise: Secondary | ICD-10-CM

## 2021-06-23 DIAGNOSIS — N179 Acute kidney failure, unspecified: Secondary | ICD-10-CM

## 2021-06-23 DIAGNOSIS — J449 Chronic obstructive pulmonary disease, unspecified: Secondary | ICD-10-CM | POA: Diagnosis not present

## 2021-06-23 DIAGNOSIS — J9611 Chronic respiratory failure with hypoxia: Secondary | ICD-10-CM

## 2021-06-23 DIAGNOSIS — I739 Peripheral vascular disease, unspecified: Secondary | ICD-10-CM | POA: Diagnosis not present

## 2021-06-23 DIAGNOSIS — D689 Coagulation defect, unspecified: Secondary | ICD-10-CM | POA: Diagnosis not present

## 2021-06-23 DIAGNOSIS — I5042 Chronic combined systolic (congestive) and diastolic (congestive) heart failure: Secondary | ICD-10-CM

## 2021-06-23 DIAGNOSIS — N2581 Secondary hyperparathyroidism of renal origin: Secondary | ICD-10-CM | POA: Diagnosis not present

## 2021-06-23 DIAGNOSIS — M159 Polyosteoarthritis, unspecified: Secondary | ICD-10-CM

## 2021-06-23 DIAGNOSIS — E440 Moderate protein-calorie malnutrition: Secondary | ICD-10-CM | POA: Diagnosis not present

## 2021-06-23 DIAGNOSIS — Z7951 Long term (current) use of inhaled steroids: Secondary | ICD-10-CM

## 2021-06-23 DIAGNOSIS — D631 Anemia in chronic kidney disease: Secondary | ICD-10-CM | POA: Diagnosis not present

## 2021-06-23 DIAGNOSIS — N186 End stage renal disease: Secondary | ICD-10-CM | POA: Diagnosis not present

## 2021-06-23 DIAGNOSIS — I4821 Permanent atrial fibrillation: Secondary | ICD-10-CM

## 2021-06-23 DIAGNOSIS — D62 Acute posthemorrhagic anemia: Secondary | ICD-10-CM

## 2021-06-23 DIAGNOSIS — I701 Atherosclerosis of renal artery: Secondary | ICD-10-CM

## 2021-06-23 DIAGNOSIS — I6529 Occlusion and stenosis of unspecified carotid artery: Secondary | ICD-10-CM

## 2021-06-23 DIAGNOSIS — D649 Anemia, unspecified: Secondary | ICD-10-CM | POA: Diagnosis not present

## 2021-06-23 DIAGNOSIS — K219 Gastro-esophageal reflux disease without esophagitis: Secondary | ICD-10-CM

## 2021-06-23 DIAGNOSIS — N178 Other acute kidney failure: Secondary | ICD-10-CM | POA: Diagnosis not present

## 2021-06-23 DIAGNOSIS — R52 Pain, unspecified: Secondary | ICD-10-CM | POA: Diagnosis not present

## 2021-06-23 DIAGNOSIS — I132 Hypertensive heart and chronic kidney disease with heart failure and with stage 5 chronic kidney disease, or end stage renal disease: Secondary | ICD-10-CM | POA: Diagnosis not present

## 2021-06-23 DIAGNOSIS — E039 Hypothyroidism, unspecified: Secondary | ICD-10-CM

## 2021-06-23 DIAGNOSIS — M1030 Gout due to renal impairment, unspecified site: Secondary | ICD-10-CM

## 2021-06-23 DIAGNOSIS — Z992 Dependence on renal dialysis: Secondary | ICD-10-CM | POA: Diagnosis not present

## 2021-06-23 DIAGNOSIS — I2510 Atherosclerotic heart disease of native coronary artery without angina pectoris: Secondary | ICD-10-CM

## 2021-06-23 DIAGNOSIS — Z23 Encounter for immunization: Secondary | ICD-10-CM | POA: Diagnosis not present

## 2021-06-23 DIAGNOSIS — E785 Hyperlipidemia, unspecified: Secondary | ICD-10-CM

## 2021-06-24 DIAGNOSIS — D62 Acute posthemorrhagic anemia: Secondary | ICD-10-CM | POA: Diagnosis not present

## 2021-06-24 DIAGNOSIS — I5042 Chronic combined systolic (congestive) and diastolic (congestive) heart failure: Secondary | ICD-10-CM | POA: Diagnosis not present

## 2021-06-24 DIAGNOSIS — E039 Hypothyroidism, unspecified: Secondary | ICD-10-CM | POA: Diagnosis not present

## 2021-06-24 DIAGNOSIS — J9611 Chronic respiratory failure with hypoxia: Secondary | ICD-10-CM | POA: Diagnosis not present

## 2021-06-24 DIAGNOSIS — N186 End stage renal disease: Secondary | ICD-10-CM | POA: Diagnosis not present

## 2021-06-24 DIAGNOSIS — D631 Anemia in chronic kidney disease: Secondary | ICD-10-CM | POA: Diagnosis not present

## 2021-06-24 DIAGNOSIS — Z951 Presence of aortocoronary bypass graft: Secondary | ICD-10-CM | POA: Diagnosis not present

## 2021-06-24 DIAGNOSIS — M159 Polyosteoarthritis, unspecified: Secondary | ICD-10-CM | POA: Diagnosis not present

## 2021-06-24 DIAGNOSIS — J449 Chronic obstructive pulmonary disease, unspecified: Secondary | ICD-10-CM | POA: Diagnosis not present

## 2021-06-24 DIAGNOSIS — M1030 Gout due to renal impairment, unspecified site: Secondary | ICD-10-CM | POA: Diagnosis not present

## 2021-06-24 DIAGNOSIS — K219 Gastro-esophageal reflux disease without esophagitis: Secondary | ICD-10-CM | POA: Diagnosis not present

## 2021-06-24 DIAGNOSIS — Z992 Dependence on renal dialysis: Secondary | ICD-10-CM | POA: Diagnosis not present

## 2021-06-24 DIAGNOSIS — E8809 Other disorders of plasma-protein metabolism, not elsewhere classified: Secondary | ICD-10-CM | POA: Diagnosis not present

## 2021-06-24 DIAGNOSIS — I2510 Atherosclerotic heart disease of native coronary artery without angina pectoris: Secondary | ICD-10-CM | POA: Diagnosis not present

## 2021-06-24 DIAGNOSIS — Z7951 Long term (current) use of inhaled steroids: Secondary | ICD-10-CM | POA: Diagnosis not present

## 2021-06-24 DIAGNOSIS — R5381 Other malaise: Secondary | ICD-10-CM | POA: Diagnosis not present

## 2021-06-24 DIAGNOSIS — E440 Moderate protein-calorie malnutrition: Secondary | ICD-10-CM | POA: Diagnosis not present

## 2021-06-24 DIAGNOSIS — I739 Peripheral vascular disease, unspecified: Secondary | ICD-10-CM | POA: Diagnosis not present

## 2021-06-24 DIAGNOSIS — I6529 Occlusion and stenosis of unspecified carotid artery: Secondary | ICD-10-CM | POA: Diagnosis not present

## 2021-06-24 DIAGNOSIS — Z9981 Dependence on supplemental oxygen: Secondary | ICD-10-CM | POA: Diagnosis not present

## 2021-06-24 DIAGNOSIS — I132 Hypertensive heart and chronic kidney disease with heart failure and with stage 5 chronic kidney disease, or end stage renal disease: Secondary | ICD-10-CM | POA: Diagnosis not present

## 2021-06-24 DIAGNOSIS — I701 Atherosclerosis of renal artery: Secondary | ICD-10-CM | POA: Diagnosis not present

## 2021-06-24 DIAGNOSIS — I4821 Permanent atrial fibrillation: Secondary | ICD-10-CM | POA: Diagnosis not present

## 2021-06-24 DIAGNOSIS — N179 Acute kidney failure, unspecified: Secondary | ICD-10-CM | POA: Diagnosis not present

## 2021-06-24 DIAGNOSIS — E785 Hyperlipidemia, unspecified: Secondary | ICD-10-CM | POA: Diagnosis not present

## 2021-06-25 DIAGNOSIS — D649 Anemia, unspecified: Secondary | ICD-10-CM | POA: Diagnosis not present

## 2021-06-25 DIAGNOSIS — D689 Coagulation defect, unspecified: Secondary | ICD-10-CM | POA: Diagnosis not present

## 2021-06-25 DIAGNOSIS — Z23 Encounter for immunization: Secondary | ICD-10-CM | POA: Diagnosis not present

## 2021-06-25 DIAGNOSIS — N2581 Secondary hyperparathyroidism of renal origin: Secondary | ICD-10-CM | POA: Diagnosis not present

## 2021-06-25 DIAGNOSIS — R310 Gross hematuria: Secondary | ICD-10-CM | POA: Diagnosis not present

## 2021-06-25 DIAGNOSIS — R8271 Bacteriuria: Secondary | ICD-10-CM | POA: Diagnosis not present

## 2021-06-25 DIAGNOSIS — Z992 Dependence on renal dialysis: Secondary | ICD-10-CM | POA: Diagnosis not present

## 2021-06-25 DIAGNOSIS — N178 Other acute kidney failure: Secondary | ICD-10-CM | POA: Diagnosis not present

## 2021-06-25 DIAGNOSIS — R52 Pain, unspecified: Secondary | ICD-10-CM | POA: Diagnosis not present

## 2021-06-27 DIAGNOSIS — R52 Pain, unspecified: Secondary | ICD-10-CM | POA: Diagnosis not present

## 2021-06-27 DIAGNOSIS — R6889 Other general symptoms and signs: Secondary | ICD-10-CM | POA: Diagnosis not present

## 2021-06-27 DIAGNOSIS — D689 Coagulation defect, unspecified: Secondary | ICD-10-CM | POA: Diagnosis not present

## 2021-06-27 DIAGNOSIS — D649 Anemia, unspecified: Secondary | ICD-10-CM | POA: Diagnosis not present

## 2021-06-27 DIAGNOSIS — Z992 Dependence on renal dialysis: Secondary | ICD-10-CM | POA: Diagnosis not present

## 2021-06-27 DIAGNOSIS — Z23 Encounter for immunization: Secondary | ICD-10-CM | POA: Diagnosis not present

## 2021-06-27 DIAGNOSIS — N2581 Secondary hyperparathyroidism of renal origin: Secondary | ICD-10-CM | POA: Diagnosis not present

## 2021-06-27 DIAGNOSIS — N178 Other acute kidney failure: Secondary | ICD-10-CM | POA: Diagnosis not present

## 2021-06-28 DIAGNOSIS — J449 Chronic obstructive pulmonary disease, unspecified: Secondary | ICD-10-CM | POA: Diagnosis not present

## 2021-06-29 DIAGNOSIS — Z992 Dependence on renal dialysis: Secondary | ICD-10-CM | POA: Diagnosis not present

## 2021-06-29 DIAGNOSIS — R52 Pain, unspecified: Secondary | ICD-10-CM | POA: Diagnosis not present

## 2021-06-29 DIAGNOSIS — D649 Anemia, unspecified: Secondary | ICD-10-CM | POA: Diagnosis not present

## 2021-06-29 DIAGNOSIS — N2581 Secondary hyperparathyroidism of renal origin: Secondary | ICD-10-CM | POA: Diagnosis not present

## 2021-06-29 DIAGNOSIS — N178 Other acute kidney failure: Secondary | ICD-10-CM | POA: Diagnosis not present

## 2021-06-29 DIAGNOSIS — Z23 Encounter for immunization: Secondary | ICD-10-CM | POA: Diagnosis not present

## 2021-06-29 DIAGNOSIS — D689 Coagulation defect, unspecified: Secondary | ICD-10-CM | POA: Diagnosis not present

## 2021-06-30 DIAGNOSIS — N186 End stage renal disease: Secondary | ICD-10-CM | POA: Diagnosis not present

## 2021-06-30 DIAGNOSIS — M1030 Gout due to renal impairment, unspecified site: Secondary | ICD-10-CM | POA: Diagnosis not present

## 2021-06-30 DIAGNOSIS — I6529 Occlusion and stenosis of unspecified carotid artery: Secondary | ICD-10-CM | POA: Diagnosis not present

## 2021-06-30 DIAGNOSIS — J9611 Chronic respiratory failure with hypoxia: Secondary | ICD-10-CM | POA: Diagnosis not present

## 2021-06-30 DIAGNOSIS — R5381 Other malaise: Secondary | ICD-10-CM | POA: Diagnosis not present

## 2021-06-30 DIAGNOSIS — M159 Polyosteoarthritis, unspecified: Secondary | ICD-10-CM | POA: Diagnosis not present

## 2021-06-30 DIAGNOSIS — I4821 Permanent atrial fibrillation: Secondary | ICD-10-CM | POA: Diagnosis not present

## 2021-06-30 DIAGNOSIS — D62 Acute posthemorrhagic anemia: Secondary | ICD-10-CM | POA: Diagnosis not present

## 2021-06-30 DIAGNOSIS — Z992 Dependence on renal dialysis: Secondary | ICD-10-CM | POA: Diagnosis not present

## 2021-06-30 DIAGNOSIS — I132 Hypertensive heart and chronic kidney disease with heart failure and with stage 5 chronic kidney disease, or end stage renal disease: Secondary | ICD-10-CM | POA: Diagnosis not present

## 2021-06-30 DIAGNOSIS — Z7951 Long term (current) use of inhaled steroids: Secondary | ICD-10-CM | POA: Diagnosis not present

## 2021-06-30 DIAGNOSIS — I739 Peripheral vascular disease, unspecified: Secondary | ICD-10-CM | POA: Diagnosis not present

## 2021-06-30 DIAGNOSIS — I5042 Chronic combined systolic (congestive) and diastolic (congestive) heart failure: Secondary | ICD-10-CM | POA: Diagnosis not present

## 2021-06-30 DIAGNOSIS — K219 Gastro-esophageal reflux disease without esophagitis: Secondary | ICD-10-CM | POA: Diagnosis not present

## 2021-06-30 DIAGNOSIS — Z951 Presence of aortocoronary bypass graft: Secondary | ICD-10-CM | POA: Diagnosis not present

## 2021-06-30 DIAGNOSIS — E440 Moderate protein-calorie malnutrition: Secondary | ICD-10-CM | POA: Diagnosis not present

## 2021-06-30 DIAGNOSIS — Z9981 Dependence on supplemental oxygen: Secondary | ICD-10-CM | POA: Diagnosis not present

## 2021-06-30 DIAGNOSIS — I701 Atherosclerosis of renal artery: Secondary | ICD-10-CM | POA: Diagnosis not present

## 2021-06-30 DIAGNOSIS — I2510 Atherosclerotic heart disease of native coronary artery without angina pectoris: Secondary | ICD-10-CM | POA: Diagnosis not present

## 2021-06-30 DIAGNOSIS — E039 Hypothyroidism, unspecified: Secondary | ICD-10-CM | POA: Diagnosis not present

## 2021-06-30 DIAGNOSIS — J449 Chronic obstructive pulmonary disease, unspecified: Secondary | ICD-10-CM | POA: Diagnosis not present

## 2021-06-30 DIAGNOSIS — E8809 Other disorders of plasma-protein metabolism, not elsewhere classified: Secondary | ICD-10-CM | POA: Diagnosis not present

## 2021-06-30 DIAGNOSIS — E785 Hyperlipidemia, unspecified: Secondary | ICD-10-CM | POA: Diagnosis not present

## 2021-06-30 DIAGNOSIS — D631 Anemia in chronic kidney disease: Secondary | ICD-10-CM | POA: Diagnosis not present

## 2021-06-30 DIAGNOSIS — N179 Acute kidney failure, unspecified: Secondary | ICD-10-CM | POA: Diagnosis not present

## 2021-07-01 ENCOUNTER — Encounter: Payer: Medicare Other | Admitting: Physical Medicine and Rehabilitation

## 2021-07-01 DIAGNOSIS — D649 Anemia, unspecified: Secondary | ICD-10-CM | POA: Diagnosis not present

## 2021-07-01 DIAGNOSIS — D689 Coagulation defect, unspecified: Secondary | ICD-10-CM | POA: Diagnosis not present

## 2021-07-01 DIAGNOSIS — Z992 Dependence on renal dialysis: Secondary | ICD-10-CM | POA: Diagnosis not present

## 2021-07-01 DIAGNOSIS — N178 Other acute kidney failure: Secondary | ICD-10-CM | POA: Diagnosis not present

## 2021-07-01 DIAGNOSIS — R52 Pain, unspecified: Secondary | ICD-10-CM | POA: Diagnosis not present

## 2021-07-01 DIAGNOSIS — Z23 Encounter for immunization: Secondary | ICD-10-CM | POA: Diagnosis not present

## 2021-07-01 DIAGNOSIS — N2581 Secondary hyperparathyroidism of renal origin: Secondary | ICD-10-CM | POA: Diagnosis not present

## 2021-07-02 ENCOUNTER — Ambulatory Visit (INDEPENDENT_AMBULATORY_CARE_PROVIDER_SITE_OTHER): Payer: Medicare Other

## 2021-07-02 DIAGNOSIS — E440 Moderate protein-calorie malnutrition: Secondary | ICD-10-CM | POA: Diagnosis not present

## 2021-07-02 DIAGNOSIS — M1030 Gout due to renal impairment, unspecified site: Secondary | ICD-10-CM | POA: Diagnosis not present

## 2021-07-02 DIAGNOSIS — I2510 Atherosclerotic heart disease of native coronary artery without angina pectoris: Secondary | ICD-10-CM | POA: Diagnosis not present

## 2021-07-02 DIAGNOSIS — Z7951 Long term (current) use of inhaled steroids: Secondary | ICD-10-CM | POA: Diagnosis not present

## 2021-07-02 DIAGNOSIS — Z9981 Dependence on supplemental oxygen: Secondary | ICD-10-CM | POA: Diagnosis not present

## 2021-07-02 DIAGNOSIS — I4821 Permanent atrial fibrillation: Secondary | ICD-10-CM | POA: Diagnosis not present

## 2021-07-02 DIAGNOSIS — R5381 Other malaise: Secondary | ICD-10-CM | POA: Diagnosis not present

## 2021-07-02 DIAGNOSIS — I6529 Occlusion and stenosis of unspecified carotid artery: Secondary | ICD-10-CM | POA: Diagnosis not present

## 2021-07-02 DIAGNOSIS — N179 Acute kidney failure, unspecified: Secondary | ICD-10-CM | POA: Diagnosis not present

## 2021-07-02 DIAGNOSIS — J449 Chronic obstructive pulmonary disease, unspecified: Secondary | ICD-10-CM | POA: Diagnosis not present

## 2021-07-02 DIAGNOSIS — D62 Acute posthemorrhagic anemia: Secondary | ICD-10-CM | POA: Diagnosis not present

## 2021-07-02 DIAGNOSIS — N186 End stage renal disease: Secondary | ICD-10-CM | POA: Diagnosis not present

## 2021-07-02 DIAGNOSIS — K219 Gastro-esophageal reflux disease without esophagitis: Secondary | ICD-10-CM | POA: Diagnosis not present

## 2021-07-02 DIAGNOSIS — E039 Hypothyroidism, unspecified: Secondary | ICD-10-CM | POA: Diagnosis not present

## 2021-07-02 DIAGNOSIS — D631 Anemia in chronic kidney disease: Secondary | ICD-10-CM | POA: Diagnosis not present

## 2021-07-02 DIAGNOSIS — Z992 Dependence on renal dialysis: Secondary | ICD-10-CM | POA: Diagnosis not present

## 2021-07-02 DIAGNOSIS — I255 Ischemic cardiomyopathy: Secondary | ICD-10-CM | POA: Diagnosis not present

## 2021-07-02 DIAGNOSIS — I739 Peripheral vascular disease, unspecified: Secondary | ICD-10-CM | POA: Diagnosis not present

## 2021-07-02 DIAGNOSIS — I132 Hypertensive heart and chronic kidney disease with heart failure and with stage 5 chronic kidney disease, or end stage renal disease: Secondary | ICD-10-CM | POA: Diagnosis not present

## 2021-07-02 DIAGNOSIS — I5042 Chronic combined systolic (congestive) and diastolic (congestive) heart failure: Secondary | ICD-10-CM | POA: Diagnosis not present

## 2021-07-02 DIAGNOSIS — Z951 Presence of aortocoronary bypass graft: Secondary | ICD-10-CM | POA: Diagnosis not present

## 2021-07-02 DIAGNOSIS — M159 Polyosteoarthritis, unspecified: Secondary | ICD-10-CM | POA: Diagnosis not present

## 2021-07-02 DIAGNOSIS — J9611 Chronic respiratory failure with hypoxia: Secondary | ICD-10-CM | POA: Diagnosis not present

## 2021-07-02 DIAGNOSIS — I701 Atherosclerosis of renal artery: Secondary | ICD-10-CM | POA: Diagnosis not present

## 2021-07-02 DIAGNOSIS — E8809 Other disorders of plasma-protein metabolism, not elsewhere classified: Secondary | ICD-10-CM | POA: Diagnosis not present

## 2021-07-02 DIAGNOSIS — E785 Hyperlipidemia, unspecified: Secondary | ICD-10-CM | POA: Diagnosis not present

## 2021-07-03 DIAGNOSIS — D689 Coagulation defect, unspecified: Secondary | ICD-10-CM | POA: Diagnosis not present

## 2021-07-03 DIAGNOSIS — D649 Anemia, unspecified: Secondary | ICD-10-CM | POA: Diagnosis not present

## 2021-07-03 DIAGNOSIS — N178 Other acute kidney failure: Secondary | ICD-10-CM | POA: Diagnosis not present

## 2021-07-03 DIAGNOSIS — N179 Acute kidney failure, unspecified: Secondary | ICD-10-CM | POA: Diagnosis not present

## 2021-07-03 DIAGNOSIS — Z992 Dependence on renal dialysis: Secondary | ICD-10-CM | POA: Diagnosis not present

## 2021-07-03 DIAGNOSIS — Z23 Encounter for immunization: Secondary | ICD-10-CM | POA: Diagnosis not present

## 2021-07-03 DIAGNOSIS — R52 Pain, unspecified: Secondary | ICD-10-CM | POA: Diagnosis not present

## 2021-07-03 DIAGNOSIS — N2581 Secondary hyperparathyroidism of renal origin: Secondary | ICD-10-CM | POA: Diagnosis not present

## 2021-07-03 LAB — CUP PACEART REMOTE DEVICE CHECK
Battery Remaining Longevity: 90 mo
Battery Remaining Percentage: 82 %
Brady Statistic RA Percent Paced: 96 %
Brady Statistic RV Percent Paced: 0 %
Date Time Interrogation Session: 20220930103400
HighPow Impedance: 63 Ohm
Implantable Lead Implant Date: 20180703
Implantable Lead Implant Date: 20180703
Implantable Lead Location: 753859
Implantable Lead Location: 753860
Implantable Lead Model: 293
Implantable Lead Model: 3830
Implantable Lead Serial Number: 433180
Implantable Pulse Generator Implant Date: 20180703
Lead Channel Impedance Value: 429 Ohm
Lead Channel Impedance Value: 701 Ohm
Lead Channel Setting Pacing Amplitude: 2.5 V
Lead Channel Setting Pacing Amplitude: 2.5 V
Lead Channel Setting Pacing Pulse Width: 0.4 ms
Lead Channel Setting Sensing Sensitivity: 0.5 mV
Pulse Gen Serial Number: 533886

## 2021-07-06 DIAGNOSIS — N2581 Secondary hyperparathyroidism of renal origin: Secondary | ICD-10-CM | POA: Diagnosis not present

## 2021-07-06 DIAGNOSIS — Z992 Dependence on renal dialysis: Secondary | ICD-10-CM | POA: Diagnosis not present

## 2021-07-06 DIAGNOSIS — R52 Pain, unspecified: Secondary | ICD-10-CM | POA: Diagnosis not present

## 2021-07-06 DIAGNOSIS — D689 Coagulation defect, unspecified: Secondary | ICD-10-CM | POA: Diagnosis not present

## 2021-07-06 DIAGNOSIS — Z23 Encounter for immunization: Secondary | ICD-10-CM | POA: Diagnosis not present

## 2021-07-06 DIAGNOSIS — D509 Iron deficiency anemia, unspecified: Secondary | ICD-10-CM | POA: Diagnosis not present

## 2021-07-06 DIAGNOSIS — D649 Anemia, unspecified: Secondary | ICD-10-CM | POA: Diagnosis not present

## 2021-07-06 DIAGNOSIS — N178 Other acute kidney failure: Secondary | ICD-10-CM | POA: Diagnosis not present

## 2021-07-08 DIAGNOSIS — D509 Iron deficiency anemia, unspecified: Secondary | ICD-10-CM | POA: Diagnosis not present

## 2021-07-08 DIAGNOSIS — R52 Pain, unspecified: Secondary | ICD-10-CM | POA: Diagnosis not present

## 2021-07-08 DIAGNOSIS — N178 Other acute kidney failure: Secondary | ICD-10-CM | POA: Diagnosis not present

## 2021-07-08 DIAGNOSIS — N2581 Secondary hyperparathyroidism of renal origin: Secondary | ICD-10-CM | POA: Diagnosis not present

## 2021-07-08 DIAGNOSIS — D689 Coagulation defect, unspecified: Secondary | ICD-10-CM | POA: Diagnosis not present

## 2021-07-08 DIAGNOSIS — Z992 Dependence on renal dialysis: Secondary | ICD-10-CM | POA: Diagnosis not present

## 2021-07-08 DIAGNOSIS — D649 Anemia, unspecified: Secondary | ICD-10-CM | POA: Diagnosis not present

## 2021-07-08 DIAGNOSIS — Z23 Encounter for immunization: Secondary | ICD-10-CM | POA: Diagnosis not present

## 2021-07-09 ENCOUNTER — Other Ambulatory Visit (HOSPITAL_COMMUNITY): Payer: Self-pay

## 2021-07-09 ENCOUNTER — Ambulatory Visit (HOSPITAL_COMMUNITY)
Admission: RE | Admit: 2021-07-09 | Discharge: 2021-07-09 | Disposition: A | Discharge status: Home / Self Care | Payer: Medicare Other | Source: Ambulatory Visit | Attending: Cardiology | Admitting: Cardiology

## 2021-07-09 ENCOUNTER — Other Ambulatory Visit: Payer: Self-pay

## 2021-07-09 ENCOUNTER — Encounter (HOSPITAL_COMMUNITY): Payer: Self-pay | Admitting: Cardiology

## 2021-07-09 VITALS — BP 160/60 | HR 86 | Wt 152.6 lb

## 2021-07-09 DIAGNOSIS — I739 Peripheral vascular disease, unspecified: Secondary | ICD-10-CM | POA: Diagnosis not present

## 2021-07-09 DIAGNOSIS — Z87891 Personal history of nicotine dependence: Secondary | ICD-10-CM | POA: Insufficient documentation

## 2021-07-09 DIAGNOSIS — I255 Ischemic cardiomyopathy: Secondary | ICD-10-CM | POA: Diagnosis not present

## 2021-07-09 DIAGNOSIS — R531 Weakness: Secondary | ICD-10-CM | POA: Diagnosis not present

## 2021-07-09 DIAGNOSIS — I5022 Chronic systolic (congestive) heart failure: Secondary | ICD-10-CM | POA: Insufficient documentation

## 2021-07-09 DIAGNOSIS — I482 Chronic atrial fibrillation, unspecified: Secondary | ICD-10-CM | POA: Diagnosis not present

## 2021-07-09 DIAGNOSIS — Z951 Presence of aortocoronary bypass graft: Secondary | ICD-10-CM | POA: Diagnosis not present

## 2021-07-09 DIAGNOSIS — N186 End stage renal disease: Secondary | ICD-10-CM | POA: Diagnosis not present

## 2021-07-09 DIAGNOSIS — I132 Hypertensive heart and chronic kidney disease with heart failure and with stage 5 chronic kidney disease, or end stage renal disease: Secondary | ICD-10-CM | POA: Diagnosis not present

## 2021-07-09 DIAGNOSIS — I2510 Atherosclerotic heart disease of native coronary artery without angina pectoris: Secondary | ICD-10-CM | POA: Insufficient documentation

## 2021-07-09 DIAGNOSIS — Z8249 Family history of ischemic heart disease and other diseases of the circulatory system: Secondary | ICD-10-CM | POA: Insufficient documentation

## 2021-07-09 DIAGNOSIS — Z7901 Long term (current) use of anticoagulants: Secondary | ICD-10-CM | POA: Diagnosis not present

## 2021-07-09 DIAGNOSIS — E785 Hyperlipidemia, unspecified: Secondary | ICD-10-CM | POA: Insufficient documentation

## 2021-07-09 DIAGNOSIS — J449 Chronic obstructive pulmonary disease, unspecified: Secondary | ICD-10-CM | POA: Diagnosis not present

## 2021-07-09 DIAGNOSIS — I6523 Occlusion and stenosis of bilateral carotid arteries: Secondary | ICD-10-CM | POA: Diagnosis not present

## 2021-07-09 LAB — LIPID PANEL
Cholesterol: 102 mg/dL (ref 0–200)
HDL: 53 mg/dL (ref >40)
LDL Cholesterol: 35 mg/dL (ref 0–99)
Total CHOL/HDL Ratio: 1.9 RATIO
Triglycerides: 71 mg/dL (ref <150)
VLDL: 14 mg/dL (ref 0–40)

## 2021-07-09 MED ORDER — APIXABAN 2.5 MG PO TABS: 2.5000 mg | ORAL_TABLET | Freq: Two times a day (BID) | ORAL | 6 refills | Status: DC

## 2021-07-09 NOTE — Progress Notes (Signed)
Remote ICD transmission.   

## 2021-07-09 NOTE — Patient Instructions (Signed)
Start Eliquis 2.5 mg Twice daily   Labs done today, your results will be available in MyChart, we will contact you for abnormal readings.  Your physician recommends that you schedule a follow-up appointment in: 2 months  If you have any questions or concerns before your next appointment please send [place] a message through [place] or call our office at [phone].    TO LEAVE A MESSAGE FOR THE NURSE SELECT OPTION 2, PLEASE LEAVE A MESSAGE INCLUDING: YOUR NAME DATE OF BIRTH CALL BACK NUMBER REASON FOR CALL**this is important as we prioritize the call backs  YOU WILL RECEIVE A CALL BACK THE SAME DAY AS LONG AS YOU CALL BEFORE 4:00 PM  At the [facility], you and your health needs are our priority. As part of our continuing mission to provide you with exceptional heart care, we have created designated Provider Care Teams. These Care Teams include your primary Cardiologist (physician) and Advanced Practice Providers (APPs- Physician Assistants and Nurse Practitioners) who all work together to provide you with the care you need, when you need it.   You may see any of the following providers on your designated Care Team at your next follow up: Dr Glori Bickers Dr Loralie Champagne Dr Patrice Paradise, NP Lyda Jester, [place] Ginnie Smart Audry Riles, PharmD   Please be sure to bring in all your medications bottles to every appointment.

## 2021-07-10 DIAGNOSIS — N178 Other acute kidney failure: Secondary | ICD-10-CM | POA: Diagnosis not present

## 2021-07-10 DIAGNOSIS — Z992 Dependence on renal dialysis: Secondary | ICD-10-CM | POA: Diagnosis not present

## 2021-07-10 DIAGNOSIS — N2581 Secondary hyperparathyroidism of renal origin: Secondary | ICD-10-CM | POA: Diagnosis not present

## 2021-07-10 DIAGNOSIS — D649 Anemia, unspecified: Secondary | ICD-10-CM | POA: Diagnosis not present

## 2021-07-10 DIAGNOSIS — D689 Coagulation defect, unspecified: Secondary | ICD-10-CM | POA: Diagnosis not present

## 2021-07-10 DIAGNOSIS — Z23 Encounter for immunization: Secondary | ICD-10-CM | POA: Diagnosis not present

## 2021-07-10 DIAGNOSIS — R52 Pain, unspecified: Secondary | ICD-10-CM | POA: Diagnosis not present

## 2021-07-10 DIAGNOSIS — D509 Iron deficiency anemia, unspecified: Secondary | ICD-10-CM | POA: Diagnosis not present

## 2021-07-10 NOTE — Progress Notes (Signed)
PCP: Dr. Livia Snellen Cardiology: Dr. Tamala Julian HF Cardiology: Dr. Aundra Dubin  80 y.o. with chronic atrial fibrillation, CAD s/p CABG, and chronic systolic CHF presents for CHF clinic followup.  Patient had CABG in 1998.  He has had chronic atrial fibrillation long-term.  Last echo in 6/17 showed EF 25-30%.  Most recent cardiac cath was in 10/17.  This showed occluded native vessels with occluded SVG-dLAD and SVG-RCA.  SVG-OM and LIMA-LAD were patent (LIMA had stable 85% ostial stenosis).  No interventional options, medically managed.  Cardiac output was preserved, filling pressures were mildly elevated.    Echo in 6/18 showed EF 30-35%.  Patient had [place] ICD placed in 7/18 with His bundle lead. Echo in 11/19 showed EF up to 55-60%.   PFTs in 2/20 showed severe obstruction.    Patient has severe bilateral iliac disease.  He saw Dr. Fletcher Anon, plan is for medical management for now.   In 7/22, carotid dopplers showed 80-99% bilaterally. He is now following with Dr. Stanford Breed.    In 8/22, he went to the hospital with progressive weakness/fatigue and a UTI.  He was found to have AKI and eventually was started on HD.  Anticoagulation stopped with GU bleeding.   He presents today for followup of CHF and CAD.  Weight down 3 lbs.  He has numb feet but denies pain or ulcerations.  Legs feel weak.  He has had issues with gross hematuria and follows with urology. Currently, no gross hematuria.  He is no longer using his walker.  He is short of breath with moderate activity.  He mowed his grass without problems.  Able to walk into the office with no problems.  No chest pain.     Labs (10/17): hgb 10.2, K 4.1, creatinine 1.3 Labs (11/17): K 4.3, creatinine 1.5, BNP 638, LDL 87 Labs (2/18): K 4.1, creatinine 1.16, BNP 589 Labs (3/18): BNP 947, K 4.3, creatinine 1.09 Labs (10/19): LDL 49 Labs (1/20): K 4.5, creatinine 1.78, hgb 11.7 Labs (12/21) : LDL 41, HDL 41, K 4.7, creatinine 2.05 Labs (9/22): hgb  8.6  PMH: 1. Gout 2. Hyperlipidemia 3. Lower GI bleed: Colonic AVMs. 4. Atrial fibrillation: Chronic 5. HTN 6. 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  - PFTs (2/20): FVC 62%, FEV1 44%, TLC 106% => severe obstructive airways disease.  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 CAD: CABG x 4 in 1998. - LHC (10/17) with total occlusion SVG-dLAD, total occlusion SVG-RCA (new), patent SVG-OM, 85% ostial LIMA-LAD (unchanged), total occlusion of native LAD, LCx, and RCA.  No interventional option.  11. Chronic systolic CHF: Ischemic cardiomyopathy.   - Echo (6/17) with EF 25-30%, mild MR, mildly dilated RV with mildly decreased systolic function.   - RHC (10/17): mean RA 11, PA 49/14, mean PCWP 17, CI 3.84.  - Echo (6/18): EF 30-35%.  - [place] ICD with His bundle lead 7/18.  - Echo (11/19): EF 55-60%, mild asymmetric septal hypertrophy.  - Echo (9/22): EF 50-55%, moderate LVH, mildly decreased RV function, severe LAE.  12. Sinus bradycardia 13. Carotid disease: 11/19 carotid dopplers with 60-79% BICA stenosis.  - Carotid dopplers (7/21): 60-79% BICA stenosis.  - Carotid dopplers (7/22): 80-99% BICA stenosis 14. Peripheral neuropathy  SH: Married, quit smoking 1998, retired from [employer], lives in [place].    Family History  Problem Relation Age of Onset   Leukemia Mother    Kidney disease Mother        kidney  removed    Heart attack Brother    Heart disease Brother    Heart disease Father    Stomach cancer Sister    Stomach cancer Sister    Early death Brother    Hypertension Other        family    Colon cancer Neg Hx    Review of systems complete and found to be negative unless listed in HPI.    Current Outpatient Medications  Medication Sig Dispense Refill   acetaminophen (TYLENOL) 325 MG tablet Take 2 tablets (650 mg total) by mouth every 6 (six) hours as needed for mild pain, fever or headache.     albuterol (VENTOLIN  HFA) 108 (90 Base) MCG/ACT inhaler Inhale 2 puffs into the lungs every 6 (six) hours as needed for shortness of breath. 6.7 g 1   amLODipine (NORVASC) 10 MG tablet Take 1 tablet (10 mg total) by mouth daily. 30 tablet 0   apixaban (ELIQUIS) 2.5 MG TABS tablet Take 1 tablet (2.5 mg total) by mouth 2 (two) times daily. 60 tablet 6   budesonide (PULMICORT) 0.25 MG/2ML nebulizer solution USE 1 VIAL  IN  NEBULIZER TWICE  DAILY - rinse mouth after treatment (Patient taking differently: USE 1 VIAL  IN  NEBULIZER TWICE  DAILY - rinse mouth after treatment) 6 mL 5   Cholecalciferol (VITAMIN D3) 125 MCG (5000 UT) CAPS Take 1 capsule (5,000 Units total) by mouth daily. 30 capsule 0   ipratropium-albuterol (DUONEB) 0.5-2.5 (3) MG/3ML SOLN USE 1 VIAL IN NEBULIZER 4 TIMES DAILY (Patient taking differently: Take 3 mLs by nebulization 4 (four) times daily.) 9 mL 5   levothyroxine (SYNTHROID) 112 MCG tablet Take 1 tablet (112 mcg total) by mouth daily before breakfast. 90 tablet 3   loratadine (CLARITIN) 10 MG tablet Take 10 mg by mouth daily as needed (for allergies.).      multivitamin (RENA-VIT) TABS tablet Take 1 tablet by mouth at bedtime. 30 tablet 0   nitroGLYCERIN (NITROSTAT) 0.4 MG SL tablet Place 1 tablet (0.4 mg total) under the tongue every 5 (five) minutes as needed for chest pain. 6 tablet 1   OXYGEN Inhale into the lungs at bedtime.     pravastatin (PRAVACHOL) 40 MG tablet Take 1 tablet (40 mg total) by mouth daily at 6 PM. 30 tablet 0   No current facility-administered medications for this encounter.   BP (!) 160/60   Pulse 86   Wt 69.2 kg (152 lb 9.6 oz)   SpO2 97%   BMI 23.90 kg/m    Wt Readings from Last 3 Encounters:  07/09/21 69.2 kg (152 lb 9.6 oz)  06/19/21 70.6 kg (155 lb 9.6 oz)  06/13/21 67.5 kg (148 lb 13 oz)    General: NAD Neck: JVP 8 cm, no thyromegaly or thyroid nodule.  Lungs: Decreased BS bilaterally.  CV: Nondisplaced PMI.  Heart regular S1/S2, no S3/S4, no murmur.  No  peripheral edema.  No carotid bruit.  Unable to palpate pedal pulses.  Abdomen: Soft, nontender, no hepatosplenomegaly, no distention.  Skin: Intact without lesions or rashes.  Neurologic: Alert and oriented x 3.  Psych: Normal affect. Extremities: No clubbing or cyanosis.  HEENT: Normal.   Assessment/Plan: 1. CAD: s/p CABG 1998.  Occluded native vessels, patent SVG-OM and LIMA-LAD (chronic 85% ostial LIMA stenosis).  No chest pain.  No good interventional options.  - No ASA given stable CAD and on anticoagulation.     - Continue statin, check lipids.  2. Chronic systolic CHF: Ischemic cardiomyopathy.  Echo (6/17) with EF 25-30%, mildly dilated RV with decreased RV systolic function. [place] with His bundle lead placed, 93% His pacing today.  Echo in 11/19 with EF up to 55-60%. Echo in 8/22 with EF 50-55%, mildly decreased RV systolic function.  NYHA class III symptoms, chronic.  Not volume overloaded by exam.  I suspect that COPD is the major cause of his dyspnea.   - He is off Entresto, spironolactone, Coreg since starting HD.   3. Atrial fibrillation: Chronic.  Unlikely to cardiovert with chronicity.  - He is no longer having gross hematuria.  I will start him back on Eliquis 2.5 mg bid (lower dose with age and ESRD).   - If he bleeds again on Eliquis, will need to consider Watchman.  4. ESRD: Volume managed by HD.  5. COPD: Severe obstruction on 2/20 PFTs.  As above, I think that this is likely the main cause of his dyspnea.  6. PAD: Difficult to palpate pedal pulses, numbness in feet but no classic claudication.  He has severe bilateral iliac disease. Minimal claudication, legs feel weak.  - Saw Dr. Fletcher Anon, medical management.  7. Carotid stenosis: Severe bilateral ICA stenosis. - needs followup with Dr. Stanford Breed to establish plan of care, ?TCAR.  He will call Dr. Mora Appl office.   8. Hematuria: This has resolved. Follows with urology.  Followup in 2 months with APP.    Loralie Champagne 07/10/2021

## 2021-07-13 ENCOUNTER — Other Ambulatory Visit: Payer: Self-pay | Admitting: Family Medicine

## 2021-07-13 DIAGNOSIS — D509 Iron deficiency anemia, unspecified: Secondary | ICD-10-CM | POA: Diagnosis not present

## 2021-07-13 DIAGNOSIS — Z992 Dependence on renal dialysis: Secondary | ICD-10-CM | POA: Diagnosis not present

## 2021-07-13 DIAGNOSIS — Z23 Encounter for immunization: Secondary | ICD-10-CM | POA: Diagnosis not present

## 2021-07-13 DIAGNOSIS — N178 Other acute kidney failure: Secondary | ICD-10-CM | POA: Diagnosis not present

## 2021-07-13 DIAGNOSIS — N2581 Secondary hyperparathyroidism of renal origin: Secondary | ICD-10-CM | POA: Diagnosis not present

## 2021-07-13 DIAGNOSIS — R52 Pain, unspecified: Secondary | ICD-10-CM | POA: Diagnosis not present

## 2021-07-13 DIAGNOSIS — D689 Coagulation defect, unspecified: Secondary | ICD-10-CM | POA: Diagnosis not present

## 2021-07-13 DIAGNOSIS — D649 Anemia, unspecified: Secondary | ICD-10-CM | POA: Diagnosis not present

## 2021-07-14 ENCOUNTER — Encounter: Payer: Medicare Other | Admitting: Registered Nurse

## 2021-07-14 ENCOUNTER — Other Ambulatory Visit: Payer: Self-pay | Admitting: Family Medicine

## 2021-07-14 DIAGNOSIS — I482 Chronic atrial fibrillation, unspecified: Secondary | ICD-10-CM

## 2021-07-14 DIAGNOSIS — J449 Chronic obstructive pulmonary disease, unspecified: Secondary | ICD-10-CM | POA: Diagnosis not present

## 2021-07-15 ENCOUNTER — Telehealth (HOSPITAL_COMMUNITY): Payer: Self-pay | Admitting: *Deleted

## 2021-07-15 DIAGNOSIS — J449 Chronic obstructive pulmonary disease, unspecified: Secondary | ICD-10-CM | POA: Diagnosis not present

## 2021-07-15 DIAGNOSIS — D649 Anemia, unspecified: Secondary | ICD-10-CM | POA: Diagnosis not present

## 2021-07-15 DIAGNOSIS — D509 Iron deficiency anemia, unspecified: Secondary | ICD-10-CM | POA: Diagnosis not present

## 2021-07-15 DIAGNOSIS — D689 Coagulation defect, unspecified: Secondary | ICD-10-CM | POA: Diagnosis not present

## 2021-07-15 DIAGNOSIS — Z992 Dependence on renal dialysis: Secondary | ICD-10-CM | POA: Diagnosis not present

## 2021-07-15 DIAGNOSIS — N186 End stage renal disease: Secondary | ICD-10-CM | POA: Diagnosis not present

## 2021-07-15 DIAGNOSIS — N178 Other acute kidney failure: Secondary | ICD-10-CM | POA: Diagnosis not present

## 2021-07-15 DIAGNOSIS — I5022 Chronic systolic (congestive) heart failure: Secondary | ICD-10-CM | POA: Diagnosis not present

## 2021-07-15 DIAGNOSIS — N2581 Secondary hyperparathyroidism of renal origin: Secondary | ICD-10-CM | POA: Diagnosis not present

## 2021-07-15 DIAGNOSIS — I482 Chronic atrial fibrillation, unspecified: Secondary | ICD-10-CM

## 2021-07-15 DIAGNOSIS — Z23 Encounter for immunization: Secondary | ICD-10-CM | POA: Diagnosis not present

## 2021-07-15 DIAGNOSIS — R52 Pain, unspecified: Secondary | ICD-10-CM | POA: Diagnosis not present

## 2021-07-15 NOTE — Telephone Encounter (Signed)
Pts wife called stating pt started on a blood thinner recently and yesterday he started to have blood in urine again. Wife wants to know if he needs to stop blood thinned.   Routed to [place] for advice

## 2021-07-16 ENCOUNTER — Other Ambulatory Visit (HOSPITAL_COMMUNITY): Payer: Self-pay | Admitting: Cardiology

## 2021-07-16 NOTE — Progress Notes (Signed)
Opened in error

## 2021-07-17 DIAGNOSIS — N2581 Secondary hyperparathyroidism of renal origin: Secondary | ICD-10-CM | POA: Diagnosis not present

## 2021-07-17 DIAGNOSIS — Z23 Encounter for immunization: Secondary | ICD-10-CM | POA: Diagnosis not present

## 2021-07-17 DIAGNOSIS — D689 Coagulation defect, unspecified: Secondary | ICD-10-CM | POA: Diagnosis not present

## 2021-07-17 DIAGNOSIS — D649 Anemia, unspecified: Secondary | ICD-10-CM | POA: Diagnosis not present

## 2021-07-17 DIAGNOSIS — N178 Other acute kidney failure: Secondary | ICD-10-CM | POA: Diagnosis not present

## 2021-07-17 DIAGNOSIS — R52 Pain, unspecified: Secondary | ICD-10-CM | POA: Diagnosis not present

## 2021-07-17 DIAGNOSIS — Z992 Dependence on renal dialysis: Secondary | ICD-10-CM | POA: Diagnosis not present

## 2021-07-17 DIAGNOSIS — D509 Iron deficiency anemia, unspecified: Secondary | ICD-10-CM | POA: Diagnosis not present

## 2021-07-17 NOTE — Telephone Encounter (Signed)
Pts wife aware and referral placed.

## 2021-07-17 NOTE — Telephone Encounter (Signed)
With recurrent hematuria, stop Eliquis.  Please refer to see Dr. Quentin Ore with EP for consideration of Watchman.

## 2021-07-20 ENCOUNTER — Telehealth: Payer: Self-pay

## 2021-07-20 DIAGNOSIS — Z992 Dependence on renal dialysis: Secondary | ICD-10-CM | POA: Diagnosis not present

## 2021-07-20 DIAGNOSIS — N178 Other acute kidney failure: Secondary | ICD-10-CM | POA: Diagnosis not present

## 2021-07-20 DIAGNOSIS — N2581 Secondary hyperparathyroidism of renal origin: Secondary | ICD-10-CM | POA: Diagnosis not present

## 2021-07-20 DIAGNOSIS — D689 Coagulation defect, unspecified: Secondary | ICD-10-CM | POA: Diagnosis not present

## 2021-07-20 DIAGNOSIS — D509 Iron deficiency anemia, unspecified: Secondary | ICD-10-CM | POA: Diagnosis not present

## 2021-07-20 DIAGNOSIS — D649 Anemia, unspecified: Secondary | ICD-10-CM | POA: Diagnosis not present

## 2021-07-20 DIAGNOSIS — Z23 Encounter for immunization: Secondary | ICD-10-CM | POA: Diagnosis not present

## 2021-07-20 DIAGNOSIS — R52 Pain, unspecified: Secondary | ICD-10-CM | POA: Diagnosis not present

## 2021-07-20 DIAGNOSIS — J449 Chronic obstructive pulmonary disease, unspecified: Secondary | ICD-10-CM | POA: Diagnosis not present

## 2021-07-20 NOTE — Telephone Encounter (Signed)
Per Dr. Aundra Dubin, scheduled the patient for [facility] consult with Dr. Quentin Ore 08/06/2021. He was grateful for call and agrees with plan.

## 2021-07-22 DIAGNOSIS — N2581 Secondary hyperparathyroidism of renal origin: Secondary | ICD-10-CM | POA: Diagnosis not present

## 2021-07-22 DIAGNOSIS — Z23 Encounter for immunization: Secondary | ICD-10-CM | POA: Diagnosis not present

## 2021-07-22 DIAGNOSIS — R52 Pain, unspecified: Secondary | ICD-10-CM | POA: Diagnosis not present

## 2021-07-22 DIAGNOSIS — Z992 Dependence on renal dialysis: Secondary | ICD-10-CM | POA: Diagnosis not present

## 2021-07-22 DIAGNOSIS — D509 Iron deficiency anemia, unspecified: Secondary | ICD-10-CM | POA: Diagnosis not present

## 2021-07-22 DIAGNOSIS — D689 Coagulation defect, unspecified: Secondary | ICD-10-CM | POA: Diagnosis not present

## 2021-07-22 DIAGNOSIS — N178 Other acute kidney failure: Secondary | ICD-10-CM | POA: Diagnosis not present

## 2021-07-22 DIAGNOSIS — D649 Anemia, unspecified: Secondary | ICD-10-CM | POA: Diagnosis not present

## 2021-07-24 DIAGNOSIS — D649 Anemia, unspecified: Secondary | ICD-10-CM | POA: Diagnosis not present

## 2021-07-24 DIAGNOSIS — D689 Coagulation defect, unspecified: Secondary | ICD-10-CM | POA: Diagnosis not present

## 2021-07-24 DIAGNOSIS — N178 Other acute kidney failure: Secondary | ICD-10-CM | POA: Diagnosis not present

## 2021-07-24 DIAGNOSIS — N2581 Secondary hyperparathyroidism of renal origin: Secondary | ICD-10-CM | POA: Diagnosis not present

## 2021-07-24 DIAGNOSIS — Z23 Encounter for immunization: Secondary | ICD-10-CM | POA: Diagnosis not present

## 2021-07-24 DIAGNOSIS — Z992 Dependence on renal dialysis: Secondary | ICD-10-CM | POA: Diagnosis not present

## 2021-07-24 DIAGNOSIS — D509 Iron deficiency anemia, unspecified: Secondary | ICD-10-CM | POA: Diagnosis not present

## 2021-07-24 DIAGNOSIS — R52 Pain, unspecified: Secondary | ICD-10-CM | POA: Diagnosis not present

## 2021-07-27 DIAGNOSIS — N2581 Secondary hyperparathyroidism of renal origin: Secondary | ICD-10-CM | POA: Diagnosis not present

## 2021-07-27 DIAGNOSIS — Z23 Encounter for immunization: Secondary | ICD-10-CM | POA: Diagnosis not present

## 2021-07-27 DIAGNOSIS — Z992 Dependence on renal dialysis: Secondary | ICD-10-CM | POA: Diagnosis not present

## 2021-07-27 DIAGNOSIS — D649 Anemia, unspecified: Secondary | ICD-10-CM | POA: Diagnosis not present

## 2021-07-27 DIAGNOSIS — D509 Iron deficiency anemia, unspecified: Secondary | ICD-10-CM | POA: Diagnosis not present

## 2021-07-27 DIAGNOSIS — R52 Pain, unspecified: Secondary | ICD-10-CM | POA: Diagnosis not present

## 2021-07-27 DIAGNOSIS — N178 Other acute kidney failure: Secondary | ICD-10-CM | POA: Diagnosis not present

## 2021-07-27 DIAGNOSIS — D689 Coagulation defect, unspecified: Secondary | ICD-10-CM | POA: Diagnosis not present

## 2021-07-28 DIAGNOSIS — N2889 Other specified disorders of kidney and ureter: Secondary | ICD-10-CM | POA: Diagnosis not present

## 2021-07-28 DIAGNOSIS — K449 Diaphragmatic hernia without obstruction or gangrene: Secondary | ICD-10-CM | POA: Diagnosis not present

## 2021-07-28 DIAGNOSIS — J449 Chronic obstructive pulmonary disease, unspecified: Secondary | ICD-10-CM | POA: Diagnosis not present

## 2021-07-28 DIAGNOSIS — R310 Gross hematuria: Secondary | ICD-10-CM | POA: Diagnosis not present

## 2021-07-28 DIAGNOSIS — K8020 Calculus of gallbladder without cholecystitis without obstruction: Secondary | ICD-10-CM | POA: Diagnosis not present

## 2021-07-29 DIAGNOSIS — Z992 Dependence on renal dialysis: Secondary | ICD-10-CM | POA: Diagnosis not present

## 2021-07-29 DIAGNOSIS — D509 Iron deficiency anemia, unspecified: Secondary | ICD-10-CM | POA: Diagnosis not present

## 2021-07-29 DIAGNOSIS — D649 Anemia, unspecified: Secondary | ICD-10-CM | POA: Diagnosis not present

## 2021-07-29 DIAGNOSIS — R52 Pain, unspecified: Secondary | ICD-10-CM | POA: Diagnosis not present

## 2021-07-29 DIAGNOSIS — Z23 Encounter for immunization: Secondary | ICD-10-CM | POA: Diagnosis not present

## 2021-07-29 DIAGNOSIS — D689 Coagulation defect, unspecified: Secondary | ICD-10-CM | POA: Diagnosis not present

## 2021-07-29 DIAGNOSIS — N2581 Secondary hyperparathyroidism of renal origin: Secondary | ICD-10-CM | POA: Diagnosis not present

## 2021-07-29 DIAGNOSIS — N178 Other acute kidney failure: Secondary | ICD-10-CM | POA: Diagnosis not present

## 2021-07-30 ENCOUNTER — Encounter: Payer: Self-pay | Admitting: Family Medicine

## 2021-07-30 ENCOUNTER — Other Ambulatory Visit: Payer: Self-pay

## 2021-07-30 ENCOUNTER — Ambulatory Visit (INDEPENDENT_AMBULATORY_CARE_PROVIDER_SITE_OTHER): Payer: Medicare Other | Admitting: Family Medicine

## 2021-07-30 VITALS — BP 140/69 | HR 77 | Temp 97.4°F | Ht 67.0 in | Wt 153.2 lb

## 2021-07-30 DIAGNOSIS — I25708 Atherosclerosis of coronary artery bypass graft(s), unspecified, with other forms of angina pectoris: Secondary | ICD-10-CM

## 2021-07-30 DIAGNOSIS — E039 Hypothyroidism, unspecified: Secondary | ICD-10-CM | POA: Diagnosis not present

## 2021-07-30 DIAGNOSIS — I482 Chronic atrial fibrillation, unspecified: Secondary | ICD-10-CM | POA: Diagnosis not present

## 2021-07-30 DIAGNOSIS — R319 Hematuria, unspecified: Secondary | ICD-10-CM | POA: Diagnosis not present

## 2021-07-30 LAB — URINALYSIS
Bilirubin, UA: NEGATIVE
Glucose, UA: NEGATIVE
Ketones, UA: NEGATIVE
Nitrite, UA: NEGATIVE
Specific Gravity, UA: 1.015 (ref 1.005–1.030)
Urobilinogen, Ur: 0.2 mg/dL (ref 0.2–1.0)
pH, UA: 7 (ref 5.0–7.5)

## 2021-07-30 MED ORDER — ALBUTEROL SULFATE HFA 108 (90 BASE) MCG/ACT IN AERS: 2.0000 | INHALATION_SPRAY | Freq: Four times a day (QID) | RESPIRATORY_TRACT | 11 refills | Status: DC | PRN

## 2021-07-30 MED ORDER — IPRATROPIUM-ALBUTEROL 0.5-2.5 (3) MG/3ML IN SOLN: 3.0000 mL | Freq: Four times a day (QID) | RESPIRATORY_TRACT | 5 refills | Status: DC

## 2021-07-30 MED ORDER — BUDESONIDE 0.25 MG/2ML IN SUSP: RESPIRATORY_TRACT | 5 refills | Status: AC

## 2021-07-30 NOTE — Progress Notes (Signed)
Subjective:  Patient ID: [name] [name], male    DOB: 1941-06-20  Age: 80 y.o. MRN: [id number]  CC: Medical Management of Chronic Issues   HPI [name] [name] presents for follow up of hematuria and renal failure. Now going to dialysis MWF. Using central line. Hoping for improvement in renal function. Seeing Dr. Claudia Desanctis of [facility].   Off all heart meds and anticoagulants.   Legs are weak. Feet swell.     Depression screen [employer] 2/9 07/30/2021 05/15/2021 03/24/2021  Decreased Interest 0 0 0  Down, Depressed, Hopeless 0 0 0  PHQ - 2 Score 0 0 0  Altered sleeping - - -  Tired, decreased energy - - -  Change in appetite - - -  Feeling bad or failure about yourself  - - -  Trouble concentrating - - -  Moving slowly or fidgety/restless - - -  Suicidal thoughts - - -  PHQ-9 Score - - -  Some recent data might be hidden    History Eagan has a past medical history of AICD (automatic cardioverter/defibrillator) present (04/05/2017), Anemia, Arthritis, Asthma, Atrial fibrillation ([place]), CAD (coronary artery disease), CHF (congestive heart failure) ([place]), Cholecystitis (11/2013), CKD (chronic kidney disease) (2015), COPD (chronic obstructive pulmonary disease) ([place]), Esophageal reflux, Gallstones, Gastric antral vascular ectasia (2013), Gout, Heme positive stool, Hepatomegaly, Hiatal hernia, History of blood transfusion (~ 2012), Hyperlipidemia, Hypothyroidism, Other and unspecified coagulation defects, Personal history of colonic polyps (05/29/2010), and Unspecified essential hypertension.   He has a past surgical history that includes Coronary angioplasty with stent (~ 2008; 07/08/2014); Coronary artery bypass graft (1998); Hernia repair; Umbilical hernia repair (1970's); left and right heart catheterization with coronary/graft angiogram (N/A, 07/09/2014); Colonoscopy (2013); Esophagogastroduodenoscopy (2013); Givens capsule study (2013); cholecystomy tube (12/28/2014 - 01/06/2015);  Esophagogastroduodenoscopy (N/A, 12/12/2015); Cardiac catheterization (N/A, 07/15/2016); ICD IMPLANT (N/A, 04/05/2017); IR US Guide Vasc Access Right (05/27/2021); IR Fluoro Guide CV Line Right (05/27/2021); and IR Fluoro Guide CV Line Right (06/02/2021).   His family history includes Early death in his brother; Heart attack in his brother; Heart disease in his brother and father; Hypertension in an other family member; Kidney disease in his mother; Leukemia in his mother; Stomach cancer in his sister and sister.He reports that he quit smoking about 24 years ago. His smoking use included cigarettes. He has a 42.00 pack-year smoking history. He has never used smokeless tobacco. He reports current alcohol use of about 1.0 standard drink per week. He reports that he does not use drugs.    ROS Review of Systems  Constitutional:  Negative for fever.  Respiratory:  Positive for shortness of breath.   Cardiovascular:  Negative for chest pain.  Musculoskeletal:  Negative for arthralgias.  Skin:  Negative for rash.   Objective:  BP 140/69   Pulse 77   Temp (!) 97.4 F (36.3 C)   Ht 5' 7" (1.702 m)   Wt 153 lb 3.2 oz (69.5 kg)   SpO2 94%   BMI 23.99 kg/m   BP Readings from Last 3 Encounters:  07/30/21 140/69  07/09/21 (!) 160/60  06/19/21 122/68    Wt Readings from Last 3 Encounters:  07/30/21 153 lb 3.2 oz (69.5 kg)  07/09/21 152 lb 9.6 oz (69.2 kg)  06/19/21 155 lb 9.6 oz (70.6 kg)     Physical Exam Constitutional:      General: He is not in acute distress.    Appearance: He is well-developed.  HENT:     Head:  Normocephalic and atraumatic.     Right Ear: External ear normal.     Left Ear: External ear normal.     Nose: Nose normal.  Eyes:     Conjunctiva/sclera: Conjunctivae normal.     Pupils: Pupils are equal, round, and reactive to light.  Cardiovascular:     Rate and Rhythm: Normal rate and regular rhythm.     Heart sounds: Normal heart sounds. No murmur heard. Pulmonary:      Effort: Pulmonary effort is normal. No respiratory distress.     Breath sounds: Normal breath sounds. No wheezing or rales.  Abdominal:     Palpations: Abdomen is soft.     Tenderness: There is no abdominal tenderness.  Musculoskeletal:        General: Normal range of motion.     Cervical back: Normal range of motion and neck supple.  Skin:    General: Skin is warm and dry.  Neurological:     Mental Status: He is alert and oriented to person, place, and time.     Deep Tendon Reflexes: Reflexes are normal and symmetric.  Psychiatric:        Behavior: Behavior normal.        Thought Content: Thought content normal.        Judgment: Judgment normal.      Assessment & Plan:   Seddrick was seen today for medical management of chronic issues.  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 -     albuterol (VENTOLIN HFA) 108 (90 Base) MCG/ACT inhaler; Inhale 2 puffs into the lungs every 6 (six) hours as needed for shortness of breath.      I have changed [name] [name] "[name]"'s ipratropium-albuterol. I am also having him maintain his loratadine, nitroGLYCERIN, Vitamin D3, acetaminophen, levothyroxine, OXYGEN, amLODipine, b complex-vitamin c-folic acid, pravastatin, furosemide, budesonide, and albuterol.  Allergies as of 07/30/2021   No Known  Allergies      Medication List        Accurate as of July 30, 2021 11:59 PM. If you have any questions, ask your nurse or doctor.          acetaminophen 325 MG tablet Commonly known as: TYLENOL Take 2 tablets (650 mg total) by mouth every 6 (six) hours as needed for mild pain, fever or headache.   albuterol 108 (90 Base) MCG/ACT inhaler Commonly known as: VENTOLIN HFA Inhale 2 puffs into the lungs every 6 (six) hours as needed for shortness of breath.   amLODipine 10 MG tablet Commonly known as: NORVASC TAKE 1 TABLET BY MOUTH EVERY DAY   b complex-vitamin c-folic acid 0.8 MG Tabs  tablet TAKE 1 TABLET BY MOUTH EVERYDAY AT BEDTIME   budesonide 0.25 MG/2ML nebulizer solution Commonly known as: PULMICORT USE 1 VIAL  IN  NEBULIZER TWICE  DAILY - rinse mouth after treatment   furosemide 40 MG tablet Commonly known as: LASIX Take 40 mg by mouth.   ipratropium-albuterol 0.5-2.5 (3) MG/3ML Soln Commonly known as: DUONEB Take 3 mLs by nebulization every 6 (six) hours. What changed: See the new instructions. Changed by: Claretta Fraise, MD   levothyroxine 112 MCG tablet Commonly known as: SYNTHROID Take 1 tablet (112 mcg total) by mouth daily before breakfast.   loratadine 10 MG tablet Commonly known as: CLARITIN Take 10 mg by mouth daily as needed (for allergies.).   nitroGLYCERIN 0.4 MG SL tablet Commonly known as: NITROSTAT Place 1 tablet (0.4 mg total) under the tongue every 5 (five) minutes as needed for chest pain.   OXYGEN Inhale into the lungs at bedtime.   pravastatin 40 MG tablet Commonly known as: PRAVACHOL TAKE 1 TABLET BY MOUTH DAILY AT 6 PM.   Vitamin D3 125 MCG (5000 UT) Caps Take 1 capsule (5,000 Units total) by mouth daily.         Follow-up: No follow-ups on file.  Claretta Fraise, M.D.

## 2021-07-31 DIAGNOSIS — Z992 Dependence on renal dialysis: Secondary | ICD-10-CM | POA: Diagnosis not present

## 2021-07-31 DIAGNOSIS — Z23 Encounter for immunization: Secondary | ICD-10-CM | POA: Diagnosis not present

## 2021-07-31 DIAGNOSIS — D509 Iron deficiency anemia, unspecified: Secondary | ICD-10-CM | POA: Diagnosis not present

## 2021-07-31 DIAGNOSIS — N2581 Secondary hyperparathyroidism of renal origin: Secondary | ICD-10-CM | POA: Diagnosis not present

## 2021-07-31 DIAGNOSIS — N178 Other acute kidney failure: Secondary | ICD-10-CM | POA: Diagnosis not present

## 2021-07-31 DIAGNOSIS — R52 Pain, unspecified: Secondary | ICD-10-CM | POA: Diagnosis not present

## 2021-07-31 DIAGNOSIS — D649 Anemia, unspecified: Secondary | ICD-10-CM | POA: Diagnosis not present

## 2021-07-31 DIAGNOSIS — D689 Coagulation defect, unspecified: Secondary | ICD-10-CM | POA: Diagnosis not present

## 2021-07-31 LAB — CMP14+EGFR
ALT: 6 IU/L (ref 0–44)
AST: 18 IU/L (ref 0–40)
Albumin/Globulin Ratio: 1.5 (ref 1.2–2.2)
Albumin: 3.5 g/dL — ABNORMAL LOW (ref 3.6–4.6)
Alkaline Phosphatase: 76 IU/L (ref 44–121)
BUN/Creatinine Ratio: 5 — ABNORMAL LOW (ref 10–24)
BUN: 12 mg/dL (ref 8–27)
Bilirubin Total: 0.4 mg/dL (ref 0.0–1.2)
CO2: 24 mmol/L (ref 20–29)
Calcium: 8.7 mg/dL (ref 8.6–10.2)
Chloride: 98 mmol/L (ref 96–106)
Creatinine, Ser: 2.44 mg/dL — ABNORMAL HIGH (ref 0.76–1.27)
Globulin, Total: 2.3 g/dL (ref 1.5–4.5)
Glucose: 91 mg/dL (ref 70–99)
Potassium: 4.2 mmol/L (ref 3.5–5.2)
Sodium: 136 mmol/L (ref 134–144)
Total Protein: 5.8 g/dL — ABNORMAL LOW (ref 6.0–8.5)
eGFR: 26 mL/min/{1.73_m2} — ABNORMAL LOW (ref >59)

## 2021-07-31 LAB — CBC WITH DIFFERENTIAL/PLATELET
Basophils Absolute: 0.1 10*3/uL (ref 0.0–0.2)
Basos: 1 %
EOS (ABSOLUTE): 0.2 10*3/uL (ref 0.0–0.4)
Eos: 2 %
Hematocrit: 28.8 % — ABNORMAL LOW (ref 37.5–51.0)
Hemoglobin: 9.5 g/dL — ABNORMAL LOW (ref 13.0–17.7)
Immature Grans (Abs): 0 10*3/uL (ref 0.0–0.1)
Immature Granulocytes: 0 %
Lymphocytes Absolute: 1 10*3/uL (ref 0.7–3.1)
Lymphs: 9 %
MCH: 32 pg (ref 26.6–33.0)
MCHC: 33 g/dL (ref 31.5–35.7)
MCV: 97 fL (ref 79–97)
Monocytes Absolute: 1.1 10*3/uL — ABNORMAL HIGH (ref 0.1–0.9)
Monocytes: 10 %
Neutrophils Absolute: 8.7 10*3/uL — ABNORMAL HIGH (ref 1.4–7.0)
Neutrophils: 78 %
Platelets: 299 10*3/uL (ref 150–450)
RBC: 2.97 x10E6/uL — ABNORMAL LOW (ref 4.14–5.80)
RDW: 14.3 % (ref 11.6–15.4)
WBC: 11.1 10*3/uL — ABNORMAL HIGH (ref 3.4–10.8)

## 2021-07-31 LAB — TSH+FREE T4
Free T4: 1.51 ng/dL (ref 0.82–1.77)
TSH: 2.64 u[IU]/mL (ref 0.450–4.500)

## 2021-08-03 DIAGNOSIS — N178 Other acute kidney failure: Secondary | ICD-10-CM | POA: Diagnosis not present

## 2021-08-03 DIAGNOSIS — N2581 Secondary hyperparathyroidism of renal origin: Secondary | ICD-10-CM | POA: Diagnosis not present

## 2021-08-03 DIAGNOSIS — D509 Iron deficiency anemia, unspecified: Secondary | ICD-10-CM | POA: Diagnosis not present

## 2021-08-03 DIAGNOSIS — D689 Coagulation defect, unspecified: Secondary | ICD-10-CM | POA: Diagnosis not present

## 2021-08-03 DIAGNOSIS — D649 Anemia, unspecified: Secondary | ICD-10-CM | POA: Diagnosis not present

## 2021-08-03 DIAGNOSIS — Z23 Encounter for immunization: Secondary | ICD-10-CM | POA: Diagnosis not present

## 2021-08-03 DIAGNOSIS — N186 End stage renal disease: Secondary | ICD-10-CM | POA: Diagnosis not present

## 2021-08-03 DIAGNOSIS — Z992 Dependence on renal dialysis: Secondary | ICD-10-CM | POA: Diagnosis not present

## 2021-08-03 DIAGNOSIS — N179 Acute kidney failure, unspecified: Secondary | ICD-10-CM | POA: Diagnosis not present

## 2021-08-03 DIAGNOSIS — R52 Pain, unspecified: Secondary | ICD-10-CM | POA: Diagnosis not present

## 2021-08-05 ENCOUNTER — Other Ambulatory Visit: Payer: Self-pay | Admitting: Family Medicine

## 2021-08-05 DIAGNOSIS — Z992 Dependence on renal dialysis: Secondary | ICD-10-CM | POA: Diagnosis not present

## 2021-08-05 DIAGNOSIS — N178 Other acute kidney failure: Secondary | ICD-10-CM | POA: Diagnosis not present

## 2021-08-05 DIAGNOSIS — Z23 Encounter for immunization: Secondary | ICD-10-CM | POA: Diagnosis not present

## 2021-08-05 DIAGNOSIS — N2581 Secondary hyperparathyroidism of renal origin: Secondary | ICD-10-CM | POA: Diagnosis not present

## 2021-08-05 DIAGNOSIS — R52 Pain, unspecified: Secondary | ICD-10-CM | POA: Diagnosis not present

## 2021-08-05 DIAGNOSIS — D689 Coagulation defect, unspecified: Secondary | ICD-10-CM | POA: Diagnosis not present

## 2021-08-05 DIAGNOSIS — D649 Anemia, unspecified: Secondary | ICD-10-CM | POA: Diagnosis not present

## 2021-08-05 NOTE — Progress Notes (Signed)
Electrophysiology Office Note:    Date:  08/06/2021   ID:  [name], DOB Jun 25, 1941, MRN [id number]  PCP:  Claretta Fraise, MD  [facility] Cardiologist:  None  [facility] Electrophysiologist:  Vickie Epley, MD   Referring MD: Larey Dresser, MD   Chief Complaint: AF  History of Present Illness:    [name] [name] is a 80 y.o. male who presents for an evaluation of AF at the request of Dr Aundra Dubin. 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 Last appointment with Dr Aundra Dubin on 07/09/2021. Previously the patient had significant hematuria which prevented safe use of systemic anticoagulation. The hematuria had resolved so anticoagulation was restarted. Once the anticoagulation was restarted, the hematuria returned. He is referred to discuss watchman implant.      Past Medical History:  Diagnosis Date   AICD (automatic cardioverter/defibrillator) present 04/05/2017   Anemia    Arthritis    "hips; back" (07/09/2014)   Asthma    Atrial fibrillation (HCC)    CAD (coronary artery disease)    CHF (congestive heart failure) ([place])    Cholecystitis 11/2013   CKD (chronic kidney disease) 2015   Stage  3.    COPD (chronic obstructive pulmonary disease) (HCC)    on home oxygen, 2 liters at night10/2015   Esophageal reflux    Gallstones    Gastric antral vascular ectasia 2013   Gout    Heme positive stool    Hepatomegaly    Hiatal hernia    History of blood transfusion ~ 2012   "blood count dropped; had to get 3 units"   Hyperlipidemia    Hypothyroidism    Other and unspecified coagulation defects    Personal history of colonic polyps 05/29/2010   TUBULAR ADENOMA   Unspecified essential hypertension     Past Surgical History:  Procedure Laterality Date   CARDIAC CATHETERIZATION N/A 07/15/2016   Procedure: Right/Left Heart Cath and Coronary/Graft Angiography;  Surgeon: Belva Crome, MD;  Location: [facility];  Service:  Cardiovascular;  Laterality: N/A;   cholecystomy tube  12/28/2014 - 01/06/2015   tube clogged with debris, removed and IR unable to place new tube.    COLONOSCOPY  2013   Dr. Sharlett Iles: no polyps or evidence of active bleeding   CORONARY ANGIOPLASTY WITH STENT PLACEMENT  ~ 2008; 07/08/2014   "1 + 3"   CORONARY ARTERY BYPASS GRAFT  1998   CABG X4   ESOPHAGOGASTRODUODENOSCOPY  2013   Dr. 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 Sharlett Iles: AVM at 63 and blood at 30 minutes beyond first duodenal image but not actual lesion seen. If persistent IDA, bleeding, recommend enteroscopy with ablation    HERNIA REPAIR     ICD IMPLANT N/A 04/05/2017   Procedure: ICD Implant;  Surgeon: Evans Lance, MD;  Location: [facility];  Service: Cardiovascular;  Laterality: N/A;   IR FLUORO GUIDE CV LINE RIGHT  05/27/2021   IR FLUORO GUIDE CV LINE RIGHT  06/02/2021   IR US GUIDE VASC ACCESS RIGHT  05/27/2021   LEFT AND RIGHT HEART CATHETERIZATION WITH CORONARY/GRAFT ANGIOGRAM N/A 07/09/2014   Right and left heart cath, bare metal stent to SVG to RCA.  Sinclair Grooms, MD;    UMBILICAL HERNIA REPAIR  [phone]    Current Medications: Current Meds  Medication Sig   acetaminophen (TYLENOL) 325 MG tablet Take 2 tablets (650 mg total) by mouth every 6 (six) hours as needed for mild pain, fever or headache.   albuterol (VENTOLIN HFA) 108 (90 Base) MCG/ACT inhaler Inhale 2 puffs into the lungs every 6 (six) hours as needed for shortness of breath.   amLODipine (NORVASC) 10 MG tablet TAKE 1 TABLET BY MOUTH EVERY DAY   b complex-vitamin c-folic acid (NEPHRO-VITE) 0.8 MG TABS tablet TAKE 1 TABLET BY MOUTH EVERYDAY AT BEDTIME   budesonide (PULMICORT) 0.25 MG/2ML  nebulizer solution USE 1 VIAL  IN  NEBULIZER TWICE  DAILY - rinse mouth after treatment   Cholecalciferol (VITAMIN D3) 125 MCG (5000 UT) CAPS Take 1 capsule (5,000 Units total) by mouth daily.   furosemide (LASIX) 40 MG tablet Take 40 mg by mouth.   ipratropium-albuterol (DUONEB) 0.5-2.5 (3) MG/3ML SOLN Take 3 mLs by nebulization every 6 (six) hours.   levothyroxine (SYNTHROID) 112 MCG tablet Take 1 tablet (112 mcg total) by mouth daily before breakfast.   loratadine (CLARITIN) 10 MG tablet Take 10 mg by mouth daily as needed (for allergies.).    nitroGLYCERIN (NITROSTAT) 0.4 MG SL tablet Place 1 tablet (0.4 mg total) under the tongue every 5 (five) minutes as needed for chest pain.   OXYGEN Inhale into the lungs at bedtime.   pravastatin (PRAVACHOL) 40 MG tablet TAKE 1 TABLET BY MOUTH DAILY AT 6 PM.     Allergies:   Patient has no known allergies.   Social History   Socioeconomic History   Marital status: Married    Spouse name: [name]   Number of children: 2   Years of education: 12   Highest education level: High school graduate  Occupational History   Occupation: retired    Comment:  industries  Tobacco Use   Smoking status: Former    Packs/day: 1.00    Years: 42.00    Pack years: 42.00    Types: Cigarettes    Quit date: 12/16/1996    Years since quitting: 24.6   Smokeless tobacco: Never  Vaping Use   Vaping Use: Never used  Substance and Sexual Activity   Alcohol use: Yes    Alcohol/week: 1.0 standard drink    Types: 1 Cans of beer per week    Comment: 1 per month   Drug use: No   Sexual activity: Not Currently    Birth control/protection: None  Other Topics Concern   Not on file  Social History Narrative   Not on file   Social Determinants of Health   Financial Resource Strain: Not on file  Food Insecurity: Not on file  Transportation Needs: Not on file  Physical Activity: Not on file  Stress: Not on file  Social Connections: Not on file      Family History: The patient's family history includes Early death in his brother; Heart attack in his brother; Heart disease in his brother and father; Hypertension in an other family member; Kidney disease in his mother; Leukemia in his mother; Stomach cancer in his sister and sister. There is no history of Colon cancer.  ROS:   Please see the history of present illness.    All other systems reviewed and are negative.  EKGs/Labs/Other Studies Reviewed:    The following studies were reviewed today:   August 06, 2021 in clinic device  interrogation personally reviewed Battery longevity 8 years Lead parameters are stable His bundle lead in the atrial port No high-voltage therapies delivered 91% hiss bundle lead paced   Recent Labs: 06/04/2021: Magnesium 2.1 07/30/2021: ALT 6; BUN 12; Creatinine, Ser 2.44; Hemoglobin 9.5; Platelets 299; Potassium 4.2; Sodium 136; TSH 2.640  Recent Lipid Panel    Component Value Date/Time   CHOL 102 07/09/2021 1120   CHOL 96 (L) 09/09/2020 0930   TRIG 71 07/09/2021 1120   HDL 53 07/09/2021 1120   HDL 41 09/09/2020 0930   CHOLHDL 1.9 07/09/2021 1120   VLDL 14 07/09/2021 1120   LDLCALC 35 07/09/2021 1120   LDLCALC 41 09/09/2020 0930    Physical Exam:    VS:  BP 134/82   Pulse 63   Ht 5\' 7"  (1.702 m)   Wt 151 lb 12.8 oz (68.9 kg)   SpO2 95%   BMI 23.78 kg/m     Wt Readings from Last 3 Encounters:  08/06/21 151 lb 12.8 oz (68.9 kg)  07/30/21 153 lb 3.2 oz (69.5 kg)  07/09/21 152 lb 9.6 oz (69.2 kg)     GEN: Frail, elderly, chronically ill-appearing  HEENT: Normal NECK: No JVD; No carotid bruits LYMPHATICS: No lymphadenopathy CARDIAC: RRR, no murmurs, rubs, gallops RESPIRATORY:  Clear to auscultation without rales, wheezing or rhonchi  ABDOMEN: Soft, non-tender, non-distended MUSCULOSKELETAL:  No edema; No deformity  SKIN: Warm and dry NEUROLOGIC:  Alert and oriented x 3 PSYCHIATRIC:  Normal affect       ASSESSMENT:     1. Chronic atrial fibrillation ([place])   2. Chronic systolic CHF (congestive heart failure) ([place])   3. Hematuria, unspecified type   4. Essential hypertension   5. ICD (implantable cardioverter-defibrillator) in place    PLAN:    In order of problems listed above:  #Chronic atrial fibrillation Not taking an anticoagulant at this time because of recurrent hematuria.  Work-up underway for etiology of hematuria (?  Right kidney mass).  At this time, I do not think he is a candidate for watchman implant because of his multiple comorbidities and frailty and end-stage renal disease.  We discussed the link between ESRD and higher mortality around the time of watchman implant.  #Hematuria Work-up underway for source of bleeding.  Possible right kidney mass. Agree with avoiding anticoagulation at this time.  We did discuss the increased risk of stroke while off anticoagulation.  #ICD in situ Device functioning appropriately Continue remote monitoring.     Medication Adjustments/Labs and Tests Ordered: Current medicines are reviewed at length with the patient today.  Concerns regarding medicines are outlined above.  No orders of the defined types were placed in this encounter.  No orders of the defined types were placed in this encounter.    Signed, Hilton Cork. Quentin Ore, MD, [facility], [facility] 08/06/2021 11:27 AM    Electrophysiology [facility]

## 2021-08-06 ENCOUNTER — Encounter: Payer: Self-pay | Admitting: Cardiology

## 2021-08-06 ENCOUNTER — Other Ambulatory Visit: Payer: Self-pay

## 2021-08-06 ENCOUNTER — Telehealth: Payer: Self-pay | Admitting: *Deleted

## 2021-08-06 ENCOUNTER — Ambulatory Visit: Payer: Medicare Other | Admitting: Cardiology

## 2021-08-06 ENCOUNTER — Other Ambulatory Visit: Payer: Self-pay | Admitting: Family Medicine

## 2021-08-06 VITALS — BP 134/82 | HR 63 | Ht 67.0 in | Wt 151.8 lb

## 2021-08-06 DIAGNOSIS — I10 Essential (primary) hypertension: Secondary | ICD-10-CM

## 2021-08-06 DIAGNOSIS — R319 Hematuria, unspecified: Secondary | ICD-10-CM | POA: Diagnosis not present

## 2021-08-06 DIAGNOSIS — I482 Chronic atrial fibrillation, unspecified: Secondary | ICD-10-CM

## 2021-08-06 DIAGNOSIS — I5022 Chronic systolic (congestive) heart failure: Secondary | ICD-10-CM | POA: Diagnosis not present

## 2021-08-06 DIAGNOSIS — Z95810 Presence of automatic (implantable) cardiac defibrillator: Secondary | ICD-10-CM | POA: Diagnosis not present

## 2021-08-06 LAB — CUP PACEART INCLINIC DEVICE CHECK
Brady Statistic RA Percent Paced: 91 %
Date Time Interrogation Session: 20221103120102
HighPow Impedance: 64 Ohm
Implantable Lead Implant Date: 20180703
Implantable Lead Implant Date: 20180703
Implantable Lead Location: 753859
Implantable Lead Location: 753860
Implantable Lead Model: 293
Implantable Lead Model: 3830
Implantable Lead Serial Number: 433180
Implantable Pulse Generator Implant Date: 20180703
Lead Channel Impedance Value: 479 Ohm
Lead Channel Impedance Value: 731 Ohm
Lead Channel Pacing Threshold Amplitude: 0.8 V
Lead Channel Pacing Threshold Amplitude: 1.8 V
Lead Channel Pacing Threshold Pulse Width: 0.4 ms
Lead Channel Pacing Threshold Pulse Width: 1 ms
Lead Channel Sensing Intrinsic Amplitude: 1.8 mV
Lead Channel Sensing Intrinsic Amplitude: 8 mV
Lead Channel Setting Pacing Amplitude: 2.5 V
Lead Channel Setting Pacing Amplitude: 2.5 V
Lead Channel Setting Pacing Pulse Width: 0.4 ms
Lead Channel Setting Sensing Sensitivity: 0.5 mV
Pulse Gen Serial Number: 533886

## 2021-08-06 MED ORDER — ALBUTEROL SULFATE HFA 108 (90 BASE) MCG/ACT IN AERS: 2.0000 | INHALATION_SPRAY | RESPIRATORY_TRACT | 3 refills | Status: DC | PRN

## 2021-08-06 NOTE — Telephone Encounter (Signed)
Please let the patient know that I sent their prescription to their pharmacy. Thanks, WS 

## 2021-08-06 NOTE — Patient Instructions (Addendum)
Medication Instructions:  Your physician recommends that you continue on your current medications as directed. Please refer to the Current Medication list given to you today. *If you need a refill on your cardiac medications before your next appointment, please call your pharmacy*  Lab Work: None ordered. If you have labs (blood work) drawn today and your tests are completely normal, you will receive your results only by: [place] (if you have MyChart) OR A paper copy in the mail If you have any lab test that is abnormal or we need to change your treatment, we will call you to review the results.  Testing/Procedures: None ordered.  Follow-Up: At [facility], you and your health needs are our priority.  As part of our continuing mission to provide you with exceptional heart care, we have created designated Provider Care Teams.  These Care Teams include your primary Cardiologist (physician) and Advanced Practice Providers (APPs -  Physician Assistants and Nurse Practitioners) who all work together to provide you with the care you need, when you need it.  Your next appointment:   Your physician wants you to follow-up in: as needed  You will keep your appt with Dr. Lovena Le next year

## 2021-08-06 NOTE — Telephone Encounter (Signed)
Fax from [facility] RE: Ventolin HFA AER 90 mcg base Not covered under pt's insurance plane Covered Alternatives: Albuterol HFA (generic proventil, generic proair hfa) ProAir Respiclick Or submitting a PA Please advise

## 2021-08-07 DIAGNOSIS — J449 Chronic obstructive pulmonary disease, unspecified: Secondary | ICD-10-CM | POA: Diagnosis not present

## 2021-08-07 DIAGNOSIS — Z992 Dependence on renal dialysis: Secondary | ICD-10-CM | POA: Diagnosis not present

## 2021-08-07 DIAGNOSIS — N178 Other acute kidney failure: Secondary | ICD-10-CM | POA: Diagnosis not present

## 2021-08-07 DIAGNOSIS — N2581 Secondary hyperparathyroidism of renal origin: Secondary | ICD-10-CM | POA: Diagnosis not present

## 2021-08-07 DIAGNOSIS — D689 Coagulation defect, unspecified: Secondary | ICD-10-CM | POA: Diagnosis not present

## 2021-08-07 DIAGNOSIS — Z23 Encounter for immunization: Secondary | ICD-10-CM | POA: Diagnosis not present

## 2021-08-07 DIAGNOSIS — D649 Anemia, unspecified: Secondary | ICD-10-CM | POA: Diagnosis not present

## 2021-08-07 DIAGNOSIS — R52 Pain, unspecified: Secondary | ICD-10-CM | POA: Diagnosis not present

## 2021-08-10 ENCOUNTER — Other Ambulatory Visit (HOSPITAL_COMMUNITY): Payer: Self-pay | Admitting: Urology

## 2021-08-10 ENCOUNTER — Other Ambulatory Visit: Payer: Self-pay | Admitting: *Deleted

## 2021-08-10 ENCOUNTER — Telehealth: Payer: Self-pay | Admitting: Family Medicine

## 2021-08-10 ENCOUNTER — Other Ambulatory Visit: Payer: Self-pay | Admitting: Urology

## 2021-08-10 DIAGNOSIS — Z992 Dependence on renal dialysis: Secondary | ICD-10-CM | POA: Diagnosis not present

## 2021-08-10 DIAGNOSIS — D649 Anemia, unspecified: Secondary | ICD-10-CM | POA: Diagnosis not present

## 2021-08-10 DIAGNOSIS — D689 Coagulation defect, unspecified: Secondary | ICD-10-CM | POA: Diagnosis not present

## 2021-08-10 DIAGNOSIS — Z23 Encounter for immunization: Secondary | ICD-10-CM | POA: Diagnosis not present

## 2021-08-10 DIAGNOSIS — R52 Pain, unspecified: Secondary | ICD-10-CM | POA: Diagnosis not present

## 2021-08-10 DIAGNOSIS — N2581 Secondary hyperparathyroidism of renal origin: Secondary | ICD-10-CM | POA: Diagnosis not present

## 2021-08-10 DIAGNOSIS — N178 Other acute kidney failure: Secondary | ICD-10-CM | POA: Diagnosis not present

## 2021-08-10 DIAGNOSIS — R590 Localized enlarged lymph nodes: Secondary | ICD-10-CM

## 2021-08-10 MED ORDER — ALBUTEROL SULFATE HFA 108 (90 BASE) MCG/ACT IN AERS: 2.0000 | INHALATION_SPRAY | RESPIRATORY_TRACT | 3 refills | Status: DC | PRN

## 2021-08-10 NOTE — Telephone Encounter (Signed)
DONE

## 2021-08-10 NOTE — Telephone Encounter (Signed)
Pt still has not received rx for albuterol (PROVENTIL HFA) 108 (90 Base) MCG/ACT inhaler from mail order. Send in again.

## 2021-08-12 DIAGNOSIS — Z992 Dependence on renal dialysis: Secondary | ICD-10-CM | POA: Diagnosis not present

## 2021-08-12 DIAGNOSIS — D689 Coagulation defect, unspecified: Secondary | ICD-10-CM | POA: Diagnosis not present

## 2021-08-12 DIAGNOSIS — N2581 Secondary hyperparathyroidism of renal origin: Secondary | ICD-10-CM | POA: Diagnosis not present

## 2021-08-12 DIAGNOSIS — N179 Acute kidney failure, unspecified: Secondary | ICD-10-CM | POA: Diagnosis not present

## 2021-08-12 DIAGNOSIS — N178 Other acute kidney failure: Secondary | ICD-10-CM | POA: Diagnosis not present

## 2021-08-12 DIAGNOSIS — Z23 Encounter for immunization: Secondary | ICD-10-CM | POA: Diagnosis not present

## 2021-08-12 DIAGNOSIS — R52 Pain, unspecified: Secondary | ICD-10-CM | POA: Diagnosis not present

## 2021-08-12 DIAGNOSIS — D649 Anemia, unspecified: Secondary | ICD-10-CM | POA: Diagnosis not present

## 2021-08-13 ENCOUNTER — Encounter: Payer: Medicare Other | Admitting: Physical Medicine and Rehabilitation

## 2021-08-14 DIAGNOSIS — D649 Anemia, unspecified: Secondary | ICD-10-CM | POA: Diagnosis not present

## 2021-08-14 DIAGNOSIS — Z992 Dependence on renal dialysis: Secondary | ICD-10-CM | POA: Diagnosis not present

## 2021-08-14 DIAGNOSIS — D689 Coagulation defect, unspecified: Secondary | ICD-10-CM | POA: Diagnosis not present

## 2021-08-14 DIAGNOSIS — N2581 Secondary hyperparathyroidism of renal origin: Secondary | ICD-10-CM | POA: Diagnosis not present

## 2021-08-14 DIAGNOSIS — R52 Pain, unspecified: Secondary | ICD-10-CM | POA: Diagnosis not present

## 2021-08-14 DIAGNOSIS — N178 Other acute kidney failure: Secondary | ICD-10-CM | POA: Diagnosis not present

## 2021-08-14 DIAGNOSIS — Z23 Encounter for immunization: Secondary | ICD-10-CM | POA: Diagnosis not present

## 2021-08-15 DIAGNOSIS — N186 End stage renal disease: Secondary | ICD-10-CM | POA: Diagnosis not present

## 2021-08-15 DIAGNOSIS — I5022 Chronic systolic (congestive) heart failure: Secondary | ICD-10-CM | POA: Diagnosis not present

## 2021-08-15 DIAGNOSIS — J449 Chronic obstructive pulmonary disease, unspecified: Secondary | ICD-10-CM | POA: Diagnosis not present

## 2021-08-17 DIAGNOSIS — Z23 Encounter for immunization: Secondary | ICD-10-CM | POA: Diagnosis not present

## 2021-08-17 DIAGNOSIS — D649 Anemia, unspecified: Secondary | ICD-10-CM | POA: Diagnosis not present

## 2021-08-17 DIAGNOSIS — D689 Coagulation defect, unspecified: Secondary | ICD-10-CM | POA: Diagnosis not present

## 2021-08-17 DIAGNOSIS — N178 Other acute kidney failure: Secondary | ICD-10-CM | POA: Diagnosis not present

## 2021-08-17 DIAGNOSIS — N2581 Secondary hyperparathyroidism of renal origin: Secondary | ICD-10-CM | POA: Diagnosis not present

## 2021-08-17 DIAGNOSIS — Z992 Dependence on renal dialysis: Secondary | ICD-10-CM | POA: Diagnosis not present

## 2021-08-17 DIAGNOSIS — R52 Pain, unspecified: Secondary | ICD-10-CM | POA: Diagnosis not present

## 2021-08-21 DIAGNOSIS — D689 Coagulation defect, unspecified: Secondary | ICD-10-CM | POA: Diagnosis not present

## 2021-08-21 DIAGNOSIS — N2581 Secondary hyperparathyroidism of renal origin: Secondary | ICD-10-CM | POA: Diagnosis not present

## 2021-08-21 DIAGNOSIS — N179 Acute kidney failure, unspecified: Secondary | ICD-10-CM | POA: Diagnosis not present

## 2021-08-21 DIAGNOSIS — Z23 Encounter for immunization: Secondary | ICD-10-CM | POA: Diagnosis not present

## 2021-08-21 DIAGNOSIS — Z992 Dependence on renal dialysis: Secondary | ICD-10-CM | POA: Diagnosis not present

## 2021-08-21 DIAGNOSIS — D649 Anemia, unspecified: Secondary | ICD-10-CM | POA: Diagnosis not present

## 2021-08-21 DIAGNOSIS — R52 Pain, unspecified: Secondary | ICD-10-CM | POA: Diagnosis not present

## 2021-08-21 DIAGNOSIS — N178 Other acute kidney failure: Secondary | ICD-10-CM | POA: Diagnosis not present

## 2021-08-24 DIAGNOSIS — Z992 Dependence on renal dialysis: Secondary | ICD-10-CM | POA: Diagnosis not present

## 2021-08-24 DIAGNOSIS — Z23 Encounter for immunization: Secondary | ICD-10-CM | POA: Diagnosis not present

## 2021-08-24 DIAGNOSIS — N2581 Secondary hyperparathyroidism of renal origin: Secondary | ICD-10-CM | POA: Diagnosis not present

## 2021-08-24 DIAGNOSIS — R52 Pain, unspecified: Secondary | ICD-10-CM | POA: Diagnosis not present

## 2021-08-24 DIAGNOSIS — D689 Coagulation defect, unspecified: Secondary | ICD-10-CM | POA: Diagnosis not present

## 2021-08-24 DIAGNOSIS — N178 Other acute kidney failure: Secondary | ICD-10-CM | POA: Diagnosis not present

## 2021-08-24 DIAGNOSIS — D649 Anemia, unspecified: Secondary | ICD-10-CM | POA: Diagnosis not present

## 2021-08-26 DIAGNOSIS — D689 Coagulation defect, unspecified: Secondary | ICD-10-CM | POA: Diagnosis not present

## 2021-08-26 DIAGNOSIS — N178 Other acute kidney failure: Secondary | ICD-10-CM | POA: Diagnosis not present

## 2021-08-26 DIAGNOSIS — N2581 Secondary hyperparathyroidism of renal origin: Secondary | ICD-10-CM | POA: Diagnosis not present

## 2021-08-26 DIAGNOSIS — Z992 Dependence on renal dialysis: Secondary | ICD-10-CM | POA: Diagnosis not present

## 2021-08-26 DIAGNOSIS — Z23 Encounter for immunization: Secondary | ICD-10-CM | POA: Diagnosis not present

## 2021-08-26 DIAGNOSIS — N179 Acute kidney failure, unspecified: Secondary | ICD-10-CM | POA: Diagnosis not present

## 2021-08-26 DIAGNOSIS — R52 Pain, unspecified: Secondary | ICD-10-CM | POA: Diagnosis not present

## 2021-08-26 DIAGNOSIS — D649 Anemia, unspecified: Secondary | ICD-10-CM | POA: Diagnosis not present

## 2021-08-28 DIAGNOSIS — J449 Chronic obstructive pulmonary disease, unspecified: Secondary | ICD-10-CM | POA: Diagnosis not present

## 2021-08-29 DIAGNOSIS — N178 Other acute kidney failure: Secondary | ICD-10-CM | POA: Diagnosis not present

## 2021-08-29 DIAGNOSIS — Z23 Encounter for immunization: Secondary | ICD-10-CM | POA: Diagnosis not present

## 2021-08-29 DIAGNOSIS — Z992 Dependence on renal dialysis: Secondary | ICD-10-CM | POA: Diagnosis not present

## 2021-08-29 DIAGNOSIS — D689 Coagulation defect, unspecified: Secondary | ICD-10-CM | POA: Diagnosis not present

## 2021-08-29 DIAGNOSIS — R52 Pain, unspecified: Secondary | ICD-10-CM | POA: Diagnosis not present

## 2021-08-29 DIAGNOSIS — N2581 Secondary hyperparathyroidism of renal origin: Secondary | ICD-10-CM | POA: Diagnosis not present

## 2021-08-29 DIAGNOSIS — D649 Anemia, unspecified: Secondary | ICD-10-CM | POA: Diagnosis not present

## 2021-08-30 ENCOUNTER — Other Ambulatory Visit: Payer: Self-pay | Admitting: Family Medicine

## 2021-08-31 ENCOUNTER — Other Ambulatory Visit: Payer: Self-pay | Admitting: Family Medicine

## 2021-08-31 DIAGNOSIS — D689 Coagulation defect, unspecified: Secondary | ICD-10-CM | POA: Diagnosis not present

## 2021-08-31 DIAGNOSIS — Z23 Encounter for immunization: Secondary | ICD-10-CM | POA: Diagnosis not present

## 2021-08-31 DIAGNOSIS — N2581 Secondary hyperparathyroidism of renal origin: Secondary | ICD-10-CM | POA: Diagnosis not present

## 2021-08-31 DIAGNOSIS — N178 Other acute kidney failure: Secondary | ICD-10-CM | POA: Diagnosis not present

## 2021-08-31 DIAGNOSIS — N179 Acute kidney failure, unspecified: Secondary | ICD-10-CM | POA: Diagnosis not present

## 2021-08-31 DIAGNOSIS — D649 Anemia, unspecified: Secondary | ICD-10-CM | POA: Diagnosis not present

## 2021-08-31 DIAGNOSIS — Z992 Dependence on renal dialysis: Secondary | ICD-10-CM | POA: Diagnosis not present

## 2021-08-31 DIAGNOSIS — R52 Pain, unspecified: Secondary | ICD-10-CM | POA: Diagnosis not present

## 2021-09-02 ENCOUNTER — Other Ambulatory Visit: Payer: Self-pay | Admitting: Family Medicine

## 2021-09-02 DIAGNOSIS — Z23 Encounter for immunization: Secondary | ICD-10-CM | POA: Diagnosis not present

## 2021-09-02 DIAGNOSIS — N178 Other acute kidney failure: Secondary | ICD-10-CM | POA: Diagnosis not present

## 2021-09-02 DIAGNOSIS — D649 Anemia, unspecified: Secondary | ICD-10-CM | POA: Diagnosis not present

## 2021-09-02 DIAGNOSIS — R52 Pain, unspecified: Secondary | ICD-10-CM | POA: Diagnosis not present

## 2021-09-02 DIAGNOSIS — D689 Coagulation defect, unspecified: Secondary | ICD-10-CM | POA: Diagnosis not present

## 2021-09-02 DIAGNOSIS — N2581 Secondary hyperparathyroidism of renal origin: Secondary | ICD-10-CM | POA: Diagnosis not present

## 2021-09-02 DIAGNOSIS — Z992 Dependence on renal dialysis: Secondary | ICD-10-CM | POA: Diagnosis not present

## 2021-09-04 DIAGNOSIS — D689 Coagulation defect, unspecified: Secondary | ICD-10-CM | POA: Diagnosis not present

## 2021-09-04 DIAGNOSIS — N178 Other acute kidney failure: Secondary | ICD-10-CM | POA: Diagnosis not present

## 2021-09-04 DIAGNOSIS — D649 Anemia, unspecified: Secondary | ICD-10-CM | POA: Diagnosis not present

## 2021-09-04 DIAGNOSIS — Z992 Dependence on renal dialysis: Secondary | ICD-10-CM | POA: Diagnosis not present

## 2021-09-04 DIAGNOSIS — N2581 Secondary hyperparathyroidism of renal origin: Secondary | ICD-10-CM | POA: Diagnosis not present

## 2021-09-07 DIAGNOSIS — N178 Other acute kidney failure: Secondary | ICD-10-CM | POA: Diagnosis not present

## 2021-09-07 DIAGNOSIS — N2581 Secondary hyperparathyroidism of renal origin: Secondary | ICD-10-CM | POA: Diagnosis not present

## 2021-09-07 DIAGNOSIS — Z992 Dependence on renal dialysis: Secondary | ICD-10-CM | POA: Diagnosis not present

## 2021-09-07 DIAGNOSIS — D649 Anemia, unspecified: Secondary | ICD-10-CM | POA: Diagnosis not present

## 2021-09-07 DIAGNOSIS — D689 Coagulation defect, unspecified: Secondary | ICD-10-CM | POA: Diagnosis not present

## 2021-09-07 NOTE — Progress Notes (Signed)
PCP: Dr. Livia Snellen Cardiology: Dr. Tamala Julian Nephrologist: Dr. Arty Baumgartner HF Cardiology: Dr. Aundra Dubin  80 y.o. with chronic atrial fibrillation, CAD s/p CABG, and chronic systolic CHF presents for CHF clinic followup.  Patient had CABG in 1998.  He has had chronic atrial fibrillation long-term.  Last echo in 6/17 showed EF 25-30%.  Most recent cardiac cath was in 10/17.  This showed occluded native vessels with occluded SVG-dLAD and SVG-RCA.  SVG-OM and LIMA-LAD were patent (LIMA had stable 85% ostial stenosis).  No interventional options, medically managed.  Cardiac output was preserved, filling pressures were mildly elevated.    Echo in 6/18 showed EF 30-35%.  Patient had [place] ICD placed in 7/18 with His bundle lead. Echo in 11/19 showed EF up to 55-60%.   PFTs in 2/20 showed severe obstruction.    Patient has severe bilateral iliac disease.  He saw Dr. Fletcher Anon, plan is for medical management for now.   In 7/22, carotid dopplers showed 80-99% bilaterally. He is now following with Dr. Stanford Breed.    In 8/22, he went to the hospital with progressive weakness/fatigue and a UTI.  He was found to have AKI and eventually was started on HD.  Anticoagulation stopped with GU bleeding.   He presents today for followup of CHF and CAD.  Weight down 3 lbs.  He has numb feet but denies pain or ulcerations.  Legs feel weak.  He has had issues with gross hematuria and follows with urology. Currently, no gross hematuria.  He is no longer using his walker.  He is short of breath with moderate activity.  He mowed his grass without problems.  Able to walk into the office with no problems.  No chest pain.     Today he returns for HF follow up with his wife. He gets around with his walker at the house and does not have significant dyspnea with ADLs. Main complaint is hip arthritis and right foot swelling. Denies CP, dizziness, palpitations, abnormal bleeding, or PND/Orthopnea. Appetite ok. No fever or chills. Weight at  home 152-154 pounds. Taking all medications. Off Eliquis w/  hematuria. Seen by EP and felt not to be Watchman candidate. HD MWF, tolerating well with no low BPs. Nephrologist put him on Lasix x 1 month for feet swelling.   Device interrogation (personally reviewed): HL Score 0, stable thoracic impedence, 0.2 hr/day activity, average HR 71, no AT/AF, no shocks  Labs (10/17): hgb 10.2, K 4.1, creatinine 1.3 Labs (11/17): K 4.3, creatinine 1.5, BNP 638, LDL 87 Labs (2/18): K 4.1, creatinine 1.16, BNP 589 Labs (3/18): BNP 947, K 4.3, creatinine 1.09 Labs (10/19): LDL 49 Labs (1/20): K 4.5, creatinine 1.78, hgb 11.7 Labs (12/21) : LDL 41, HDL 41, K 4.7, creatinine 2.05 Labs (9/22): hgb 8.6 Labs (11/22): K 4.2, creatinine 2.44  PMH: 1. Gout 2. Hyperlipidemia 3. Lower GI bleed: Colonic AVMs. 4. Atrial fibrillation: Chronic 5. HTN 6. 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  - PFTs (2/20): FVC 62%, FEV1 44%, TLC 106% => severe obstructive airways disease.  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 CAD: CABG x 4 in 1998. - LHC (10/17) with total occlusion SVG-dLAD, total occlusion SVG-RCA (new), patent SVG-OM, 85% ostial LIMA-LAD (unchanged), total occlusion of native LAD, LCx, and RCA.  No interventional option.  11. Chronic systolic CHF: Ischemic cardiomyopathy.   - Echo (6/17) with EF 25-30%, mild MR, mildly dilated RV with mildly decreased systolic function.   - RHC (10/17): mean  RA 11, PA 49/14, mean PCWP 17, CI 3.84.  - Echo (6/18): EF 30-35%.  - [place] ICD with His bundle lead 7/18.  - Echo (11/19): EF 55-60%, mild asymmetric septal hypertrophy.  - Echo (9/22): EF 50-55%, moderate LVH, mildly decreased RV function, severe LAE.  12. Sinus bradycardia 13. Carotid disease: 11/19 carotid dopplers with 60-79% BICA stenosis.  - Carotid dopplers (7/21): 60-79% BICA stenosis.  - Carotid dopplers (7/22): 80-99% BICA stenosis 14. Peripheral  neuropathy  SH: Married, quit smoking 1998, retired from [employer], lives in [place].    Family History  Problem Relation Age of Onset   Leukemia Mother    Kidney disease Mother        kidney removed    Heart attack Brother    Heart disease Brother    Heart disease Father    Stomach cancer Sister    Stomach cancer Sister    Early death Brother    Hypertension Other        family    Colon cancer Neg Hx    Review of systems complete and found to be negative unless listed in HPI.    Current Outpatient Medications  Medication Sig Dispense Refill   acetaminophen (TYLENOL) 325 MG tablet Take 2 tablets (650 mg total) by mouth every 6 (six) hours as needed for mild pain, fever or headache.     albuterol (PROVENTIL HFA) 108 (90 Base) MCG/ACT inhaler Inhale 2 puffs into the lungs every 4 (four) hours as needed for wheezing or shortness of breath. 54 g 3   amLODipine (NORVASC) 10 MG tablet TAKE 1 TABLET BY MOUTH EVERY DAY 30 tablet 2   b complex-vitamin c-folic acid (NEPHRO-VITE) 0.8 MG TABS tablet TAKE 1 TABLET BY MOUTH EVERYDAY AT BEDTIME 30 tablet 0   budesonide (PULMICORT) 0.25 MG/2ML nebulizer solution USE 1 VIAL  IN  NEBULIZER TWICE  DAILY - rinse mouth after treatment 6 mL 5   Cholecalciferol (VITAMIN D3) 125 MCG (5000 UT) CAPS Take 1 capsule (5,000 Units total) by mouth daily. 30 capsule 0   furosemide (LASIX) 40 MG tablet Take 40 mg by mouth daily.     ipratropium-albuterol (DUONEB) 0.5-2.5 (3) MG/3ML SOLN USE 1 VIAL IN NEBULIZER 4 TIMES DAILY 180 mL 5   levothyroxine (SYNTHROID) 112 MCG tablet Take 1 tablet (112 mcg total) by mouth daily before breakfast. 90 tablet 3   loratadine (CLARITIN) 10 MG tablet Take 10 mg by mouth daily as needed (for allergies.).      nitroGLYCERIN (NITROSTAT) 0.4 MG SL tablet Place 1 tablet (0.4 mg total) under the tongue every 5 (five) minutes as needed for chest pain. 6 tablet 1   OXYGEN Inhale into the lungs at bedtime.     pravastatin  (PRAVACHOL) 40 MG tablet TAKE 1 TABLET BY MOUTH DAILY AT 6 PM. 30 tablet 2   No current facility-administered medications for this encounter.   BP 130/80   Pulse 63   Wt 68.9 kg (152 lb)   SpO2 97%   BMI 23.81 kg/m    Wt Readings from Last 3 Encounters:  09/08/21 68.9 kg (152 lb)  08/06/21 68.9 kg (151 lb 12.8 oz)  07/30/21 69.5 kg (153 lb 3.2 oz)    General:  NAD. No resp difficulty, cachectic, walked into clinic with walker HEENT: Normal Neck: Supple. No JVD. Carotids 2+ bilat; no bruits. No lymphadenopathy or thryomegaly appreciated. Cor: PMI nondisplaced. Regular rate & rhythm. No rubs, gallops or murmurs. Lungs: Clear  HD site looks ok. Abdomen: Soft, nontender, nondistended. No hepatosplenomegaly. No bruits or masses. Good bowel sounds. Extremities: No cyanosis, clubbing, rash, pedal edema R>L, pruritic, scaly/crusted red lesions to top of right foot Neuro: Alert & oriented x 3, cranial nerves grossly intact. Moves all 4 extremities w/o difficulty. Affect pleasant.  Assessment/Plan: 1. CAD: s/p CABG 1998.  Occluded native vessels, patent SVG-OM and LIMA-LAD (chronic 85% ostial LIMA stenosis).  No chest pain.  No good interventional options.  - No ASA given stable CAD and on anticoagulation.     - Continue statin, lipids 10/22 ok 2. Chronic systolic CHF: Ischemic cardiomyopathy.  Echo (6/17) with EF 25-30%, mildly dilated RV with decreased RV systolic function. [place] with His bundle lead placed, 93% His pacing today.  Echo in 11/19 with EF up to 55-60%. Echo in 8/22 with EF 50-55%, mildly decreased RV systolic function.  NYHA class III symptoms, chronic.  Not volume overloaded by exam or device interrogation.  I suspect that COPD is the major cause of his dyspnea.  I think his pedal edema could be 2/2 amlodipine use. Avoid compression with severe PAD. Advised elevating legs as much as possible when at home. - He is off Entresto, spironolactone, Coreg since starting  HD.   - Continue Lasix 40 mg daily (this was started by Nephrology for LE swelling). 3. Atrial fibrillation: Chronic.  Unlikely to cardiovert with chronicity.  - He remains off Eliquis due to hematuria.  - Evaluated by EP, felt not to be a candidate for Watchman. 4. ESRD: Volume managed by HD. Will have vein mapping in January. 5. COPD: Severe obstruction on 2/20 PFTs.  As above, I think that this is likely the main cause of his dyspnea.  6. PAD: Difficult to palpate pedal pulses, no classic claudication.  He has severe bilateral iliac disease. Minimal claudication, legs feel weak.  - Saw Dr. Fletcher Anon, medical management.  7. Carotid stenosis: Severe bilateral ICA stenosis. - needs followup with Dr. Stanford Breed to establish plan of care, ?TCAR.  He will call Dr. Mora Appl office.   8. Hematuria: This has resolved. Follows with urology. 9. Right foot rash: Look like eczema or nonspecific dermatitis.  - Continue loratadine. Can try OTC hydrocortisone 1% topical.  - Has follow up with PCP this week.  Followup in 2-3 months with Dr. Aundra Dubin (patient prefers 3 months).   [place] FNP 09/08/2021

## 2021-09-08 ENCOUNTER — Other Ambulatory Visit: Payer: Self-pay

## 2021-09-08 ENCOUNTER — Ambulatory Visit (HOSPITAL_COMMUNITY)
Admission: RE | Admit: 2021-09-08 | Discharge: 2021-09-08 | Disposition: A | Discharge status: Home / Self Care | Payer: Medicare Other | Source: Ambulatory Visit | Attending: Family Medicine | Admitting: Family Medicine

## 2021-09-08 ENCOUNTER — Encounter (HOSPITAL_COMMUNITY): Payer: Self-pay

## 2021-09-08 VITALS — BP 130/80 | HR 63 | Wt 152.0 lb

## 2021-09-08 DIAGNOSIS — N186 End stage renal disease: Secondary | ICD-10-CM

## 2021-09-08 DIAGNOSIS — R21 Rash and other nonspecific skin eruption: Secondary | ICD-10-CM

## 2021-09-08 DIAGNOSIS — Z87440 Personal history of urinary (tract) infections: Secondary | ICD-10-CM | POA: Insufficient documentation

## 2021-09-08 DIAGNOSIS — R319 Hematuria, unspecified: Secondary | ICD-10-CM | POA: Diagnosis not present

## 2021-09-08 DIAGNOSIS — M7989 Other specified soft tissue disorders: Secondary | ICD-10-CM | POA: Insufficient documentation

## 2021-09-08 DIAGNOSIS — I482 Chronic atrial fibrillation, unspecified: Secondary | ICD-10-CM

## 2021-09-08 DIAGNOSIS — I255 Ischemic cardiomyopathy: Secondary | ICD-10-CM | POA: Diagnosis not present

## 2021-09-08 DIAGNOSIS — I5022 Chronic systolic (congestive) heart failure: Secondary | ICD-10-CM | POA: Diagnosis not present

## 2021-09-08 DIAGNOSIS — L309 Dermatitis, unspecified: Secondary | ICD-10-CM | POA: Diagnosis not present

## 2021-09-08 DIAGNOSIS — I2510 Atherosclerotic heart disease of native coronary artery without angina pectoris: Secondary | ICD-10-CM

## 2021-09-08 DIAGNOSIS — Z95810 Presence of automatic (implantable) cardiac defibrillator: Secondary | ICD-10-CM | POA: Insufficient documentation

## 2021-09-08 DIAGNOSIS — J449 Chronic obstructive pulmonary disease, unspecified: Secondary | ICD-10-CM

## 2021-09-08 DIAGNOSIS — I132 Hypertensive heart and chronic kidney disease with heart failure and with stage 5 chronic kidney disease, or end stage renal disease: Secondary | ICD-10-CM | POA: Diagnosis not present

## 2021-09-08 DIAGNOSIS — Z79899 Other long term (current) drug therapy: Secondary | ICD-10-CM | POA: Insufficient documentation

## 2021-09-08 DIAGNOSIS — I6523 Occlusion and stenosis of bilateral carotid arteries: Secondary | ICD-10-CM

## 2021-09-08 DIAGNOSIS — M169 Osteoarthritis of hip, unspecified: Secondary | ICD-10-CM | POA: Diagnosis not present

## 2021-09-08 DIAGNOSIS — R531 Weakness: Secondary | ICD-10-CM | POA: Diagnosis not present

## 2021-09-08 DIAGNOSIS — I739 Peripheral vascular disease, unspecified: Secondary | ICD-10-CM | POA: Diagnosis not present

## 2021-09-08 DIAGNOSIS — Z951 Presence of aortocoronary bypass graft: Secondary | ICD-10-CM | POA: Insufficient documentation

## 2021-09-08 NOTE — Patient Instructions (Signed)
Be sure to use HYDROCORTISONE CREAM 1%, apply to affected area twice a day. Use more than 2 weeks  It was great to see you today! No medication changes are needed at this time.  Your physician recommends that you schedule a follow-up appointment in: 3 months with Dr Aundra Dubin  Do the following things EVERYDAY: Weigh yourself in the morning before breakfast. Write it down and keep it in a log. Take your medicines as prescribed Eat low salt foods--Limit salt (sodium) to 2000 mg per day.  Stay as active as you can everyday Limit all fluids for the day to less than 2 liters  At the [facility], you and your health needs are our priority. As part of our continuing mission to provide you with exceptional heart care, we have created designated Provider Care Teams. These Care Teams include your primary Cardiologist (physician) and Advanced Practice Providers (APPs- Physician Assistants and Nurse Practitioners) who all work together to provide you with the care you need, when you need it.   You may see any of the following providers on your designated Care Team at your next follow up: Dr Glori Bickers Dr Haynes Kerns, NP Lyda Jester, [place] [facility] [place], [place] Audry Riles, PharmD   Please be sure to bring in all your medications bottles to every appointment.

## 2021-09-09 DIAGNOSIS — N178 Other acute kidney failure: Secondary | ICD-10-CM | POA: Diagnosis not present

## 2021-09-09 DIAGNOSIS — D689 Coagulation defect, unspecified: Secondary | ICD-10-CM | POA: Diagnosis not present

## 2021-09-09 DIAGNOSIS — N2581 Secondary hyperparathyroidism of renal origin: Secondary | ICD-10-CM | POA: Diagnosis not present

## 2021-09-09 DIAGNOSIS — D649 Anemia, unspecified: Secondary | ICD-10-CM | POA: Diagnosis not present

## 2021-09-09 DIAGNOSIS — Z992 Dependence on renal dialysis: Secondary | ICD-10-CM | POA: Diagnosis not present

## 2021-09-10 ENCOUNTER — Emergency Department (HOSPITAL_COMMUNITY)
Admission: EM | Admit: 2021-09-10 | Discharge: 2021-09-10 | Disposition: A | Discharge status: Home / Self Care | Payer: Medicare Other | Attending: Emergency Medicine | Admitting: Emergency Medicine

## 2021-09-10 ENCOUNTER — Emergency Department (HOSPITAL_COMMUNITY): Payer: Medicare Other

## 2021-09-10 ENCOUNTER — Other Ambulatory Visit: Payer: Self-pay

## 2021-09-10 ENCOUNTER — Ambulatory Visit: Payer: Medicare Other | Admitting: Family Medicine

## 2021-09-10 DIAGNOSIS — Z79899 Other long term (current) drug therapy: Secondary | ICD-10-CM | POA: Insufficient documentation

## 2021-09-10 DIAGNOSIS — J90 Pleural effusion, not elsewhere classified: Secondary | ICD-10-CM | POA: Diagnosis not present

## 2021-09-10 DIAGNOSIS — R600 Localized edema: Secondary | ICD-10-CM | POA: Diagnosis not present

## 2021-09-10 DIAGNOSIS — I130 Hypertensive heart and chronic kidney disease with heart failure and stage 1 through stage 4 chronic kidney disease, or unspecified chronic kidney disease: Secondary | ICD-10-CM | POA: Diagnosis not present

## 2021-09-10 DIAGNOSIS — I5022 Chronic systolic (congestive) heart failure: Secondary | ICD-10-CM | POA: Diagnosis not present

## 2021-09-10 DIAGNOSIS — R109 Unspecified abdominal pain: Secondary | ICD-10-CM

## 2021-09-10 DIAGNOSIS — R599 Enlarged lymph nodes, unspecified: Secondary | ICD-10-CM | POA: Diagnosis not present

## 2021-09-10 DIAGNOSIS — I4891 Unspecified atrial fibrillation: Secondary | ICD-10-CM | POA: Insufficient documentation

## 2021-09-10 DIAGNOSIS — I2510 Atherosclerotic heart disease of native coronary artery without angina pectoris: Secondary | ICD-10-CM | POA: Insufficient documentation

## 2021-09-10 DIAGNOSIS — E039 Hypothyroidism, unspecified: Secondary | ICD-10-CM | POA: Diagnosis not present

## 2021-09-10 DIAGNOSIS — R079 Chest pain, unspecified: Secondary | ICD-10-CM | POA: Diagnosis not present

## 2021-09-10 DIAGNOSIS — Z7951 Long term (current) use of inhaled steroids: Secondary | ICD-10-CM | POA: Diagnosis not present

## 2021-09-10 DIAGNOSIS — K820 Obstruction of gallbladder: Secondary | ICD-10-CM | POA: Diagnosis not present

## 2021-09-10 DIAGNOSIS — J449 Chronic obstructive pulmonary disease, unspecified: Secondary | ICD-10-CM | POA: Diagnosis not present

## 2021-09-10 DIAGNOSIS — D72829 Elevated white blood cell count, unspecified: Secondary | ICD-10-CM | POA: Diagnosis not present

## 2021-09-10 DIAGNOSIS — M5136 Other intervertebral disc degeneration, lumbar region: Secondary | ICD-10-CM | POA: Diagnosis not present

## 2021-09-10 DIAGNOSIS — Z951 Presence of aortocoronary bypass graft: Secondary | ICD-10-CM | POA: Diagnosis not present

## 2021-09-10 DIAGNOSIS — N261 Atrophy of kidney (terminal): Secondary | ICD-10-CM | POA: Diagnosis not present

## 2021-09-10 DIAGNOSIS — J45909 Unspecified asthma, uncomplicated: Secondary | ICD-10-CM | POA: Insufficient documentation

## 2021-09-10 DIAGNOSIS — Z87891 Personal history of nicotine dependence: Secondary | ICD-10-CM | POA: Insufficient documentation

## 2021-09-10 DIAGNOSIS — N2889 Other specified disorders of kidney and ureter: Secondary | ICD-10-CM | POA: Diagnosis not present

## 2021-09-10 DIAGNOSIS — R1031 Right lower quadrant pain: Secondary | ICD-10-CM | POA: Diagnosis not present

## 2021-09-10 DIAGNOSIS — R591 Generalized enlarged lymph nodes: Secondary | ICD-10-CM | POA: Insufficient documentation

## 2021-09-10 DIAGNOSIS — N183 Chronic kidney disease, stage 3 unspecified: Secondary | ICD-10-CM | POA: Insufficient documentation

## 2021-09-10 LAB — CBC WITH DIFFERENTIAL/PLATELET
Abs Immature Granulocytes: 0.07 10*3/uL (ref 0.00–0.07)
Basophils Absolute: 0.1 10*3/uL (ref 0.0–0.1)
Basophils Relative: 1 %
Eosinophils Absolute: 0.2 10*3/uL (ref 0.0–0.5)
Eosinophils Relative: 2 %
HCT: 32.2 % — ABNORMAL LOW (ref 39.0–52.0)
Hemoglobin: 10.3 g/dL — ABNORMAL LOW (ref 13.0–17.0)
Immature Granulocytes: 1 %
Lymphocytes Relative: 8 %
Lymphs Abs: 0.9 10*3/uL (ref 0.7–4.0)
MCH: 31.8 pg (ref 26.0–34.0)
MCHC: 32 g/dL (ref 30.0–36.0)
MCV: 99.4 fL (ref 80.0–100.0)
Monocytes Absolute: 1.1 10*3/uL — ABNORMAL HIGH (ref 0.1–1.0)
Monocytes Relative: 9 %
Neutro Abs: 9.2 10*3/uL — ABNORMAL HIGH (ref 1.7–7.7)
Neutrophils Relative %: 79 %
Platelets: 288 10*3/uL (ref 150–400)
RBC: 3.24 MIL/uL — ABNORMAL LOW (ref 4.22–5.81)
RDW: 14.6 % (ref 11.5–15.5)
WBC: 11.6 10*3/uL — ABNORMAL HIGH (ref 4.0–10.5)
nRBC: 0 % (ref 0.0–0.2)

## 2021-09-10 LAB — COMPREHENSIVE METABOLIC PANEL
ALT: 8 U/L (ref 0–44)
AST: 19 U/L (ref 15–41)
Albumin: 3.1 g/dL — ABNORMAL LOW (ref 3.5–5.0)
Alkaline Phosphatase: 57 U/L (ref 38–126)
Anion gap: 10 (ref 5–15)
BUN: 15 mg/dL (ref 8–23)
CO2: 24 mmol/L (ref 22–32)
Calcium: 8.8 mg/dL — ABNORMAL LOW (ref 8.9–10.3)
Chloride: 96 mmol/L — ABNORMAL LOW (ref 98–111)
Creatinine, Ser: 2.72 mg/dL — ABNORMAL HIGH (ref 0.61–1.24)
GFR, Estimated: 23 mL/min — ABNORMAL LOW (ref >60)
Glucose, Bld: 86 mg/dL (ref 70–99)
Potassium: 4.1 mmol/L (ref 3.5–5.1)
Sodium: 130 mmol/L — ABNORMAL LOW (ref 135–145)
Total Bilirubin: 0.2 mg/dL — ABNORMAL LOW (ref 0.3–1.2)
Total Protein: 6 g/dL — ABNORMAL LOW (ref 6.5–8.1)

## 2021-09-10 LAB — LIPASE, BLOOD: Lipase: 23 U/L (ref 11–51)

## 2021-09-10 MED ORDER — HYDROCODONE-ACETAMINOPHEN 5-325 MG PO TABS: 1.0000 | ORAL_TABLET | Freq: Four times a day (QID) | ORAL | 0 refills | Status: DC | PRN

## 2021-09-10 NOTE — ED Triage Notes (Signed)
Pt. Stated, ive had rt. Side stomach pain into the back started 2 days ago

## 2021-09-10 NOTE — ED Provider Notes (Signed)
[facility] EMERGENCY DEPARTMENT Provider Note   CSN: [id number] Arrival date & time: 09/10/21  0913     History Chief Complaint  Patient presents with   Abdominal Pain    [name] [name] is a 80 y.o. male.   Abdominal Pain Associated symptoms: no chest pain, no chills, no cough, no dysuria, no fever, no hematuria, no shortness of breath, no sore throat and no vomiting    80 year old male with medical history significant for CAD, CHF, ESRD on HD, COPD on 2 L O2 at night, GERD, HLD presenting to the emergency department with abdominal pain.  The patient states that he has had abdominal pain for the last 2 to 3 days.  Pain has been intermittent and severe, particularly last night.  He endorses severe right sided and right flank abdominal pain that has come and gone during that time.  He endorses a prior history of this and had a CT scan in October which showed some lymphadenopathy surrounding his renal vasculature.  He follows with [facility]rology via [facility] for this and has been discussing the possibility of a biopsy.  He denies any hematuria, dysuria, fever or chills.  He is currently pain-free.  No aggravating or alleviating symptoms.  Past Medical History:  Diagnosis Date   AICD (automatic cardioverter/defibrillator) present 04/05/2017   Anemia    Arthritis    "hips; back" (07/09/2014)   Asthma    Atrial fibrillation (HCC)    CAD (coronary artery disease)    CHF (congestive heart failure) ([place])    Cholecystitis 11/2013   CKD (chronic kidney disease) 2015   Stage  3.    COPD (chronic obstructive pulmonary disease) (HCC)    on home oxygen, 2 liters at night10/2015   Esophageal reflux    Gallstones    Gastric antral vascular ectasia 2013   Gout    Heme positive stool    Hepatomegaly    Hiatal hernia    History of blood transfusion ~ 2012   "blood count dropped; had to get 3 units"   Hyperlipidemia    Hypothyroidism    Other and unspecified coagulation  defects    Personal history of colonic polyps 05/29/2010   TUBULAR ADENOMA   Unspecified essential hypertension     Patient Active Problem List   Diagnosis Date Noted   Debility 06/05/2021   Atrial fibrillation ([place])    Acute on chronic anemia    Hyperkalemia 05/23/2021   Hyperphosphatemia 05/23/2021   Hyponatremia 05/23/2021   Hypoalbuminemia 05/23/2021   Hypoglycemia due to insulin 05/23/2021   Hematuria of unknown cause 05/13/2021   ICD (implantable cardioverter-defibrillator) in place 05/04/2019   NSVT (nonsustained ventricular tachycardia) 05/04/2019   Hypothyroidism 01/16/2018   Bradycardia 03/18/2017   GI bleed 03/22/2016   Dyspnea 03/22/2016   GAVE (gastric antral vascular ectasia)    CKD (chronic kidney disease), stage III ([place]) 01/06/2015   Acute kidney injury superimposed on CKD ([place]) 11/26/2014   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[place]) 07/09/2014   Coronary atherosclerosis of native coronary artery 06/04/2014   Atrial fibrillation, chronic ([place]) 01/16/2013   Gout 12/16/2012   Hyperlipemia 12/16/2012   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   Normocytic anemia 16/10/930   Chronic systolic CHF (congestive heart failure) ([place]) 11/26/2011   Chronic anticoagulation 11/26/2011   Hx of gastroesophageal reflux (GERD) 11/26/2011   Obesity 11/26/2011   Asthma with COPD ([place]) 11/26/2011   Iron deficiency anemia 05/13/2010   HEPATOMEGALY 05/13/2010  Essential hypertension 02/03/2008    Past Surgical History:  Procedure Laterality Date   CARDIAC CATHETERIZATION N/A 07/15/2016   Procedure: Right/Left Heart Cath and Coronary/Graft Angiography;  Surgeon: Belva Crome, MD;  Location: [facility];  Service: Cardiovascular;  Laterality: N/A;   cholecystomy tube  12/28/2014 - 01/06/2015   tube clogged with debris, removed and IR unable to place new tube.    COLONOSCOPY  2013   Dr. Sharlett Iles: no polyps or evidence of active bleeding   CORONARY ANGIOPLASTY  WITH STENT PLACEMENT  ~ 2008; 07/08/2014   "1 + 3"   CORONARY ARTERY BYPASS GRAFT  1998   CABG X4   ESOPHAGOGASTRODUODENOSCOPY  2013   Dr. 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 Sharlett Iles: AVM at 31 and blood at 30 minutes beyond first duodenal image but not actual lesion seen. If persistent IDA, bleeding, recommend enteroscopy with ablation    HERNIA REPAIR     ICD IMPLANT N/A 04/05/2017   Procedure: ICD Implant;  Surgeon: Evans Lance, MD;  Location: [facility];  Service: Cardiovascular;  Laterality: N/A;   IR FLUORO GUIDE CV LINE RIGHT  05/27/2021   IR FLUORO GUIDE CV LINE RIGHT  06/02/2021   IR US GUIDE VASC ACCESS RIGHT  05/27/2021   LEFT AND RIGHT HEART CATHETERIZATION WITH CORONARY/GRAFT ANGIOGRAM N/A 07/09/2014   Right and left heart cath, bare metal stent to SVG to RCA. Sinclair Grooms, MD;    UMBILICAL HERNIA REPAIR  [phone]       Family History  Problem Relation Age of Onset   Leukemia Mother    Kidney disease Mother        kidney removed    Heart attack Brother    Heart disease Brother    Heart disease Father    Stomach cancer Sister    Stomach cancer Sister    Early death Brother    Hypertension Other        family    Colon cancer Neg Hx     Social History   Tobacco Use   Smoking status: Former    Packs/day: 1.00    Years: 42.00    Pack years: 42.00    Types: Cigarettes    Quit date: 12/16/1996    Years since quitting: 24.7   Smokeless tobacco: Never  Vaping Use   Vaping Use: Never used  Substance Use Topics   Alcohol use: Yes    Alcohol/week: 1.0 standard drink    Types: 1 Cans of beer per week    Comment: 1 per month   Drug use: No    Home Medications Prior to Admission  medications   Medication Sig Start Date End Date Taking? Authorizing Provider  HYDROcodone-acetaminophen (NORCO/VICODIN) 5-325 MG tablet Take 1-2 tablets by mouth every 6 (six) hours as needed. 09/10/21  Yes Regan Lemming, MD  acetaminophen (TYLENOL) 325 MG tablet Take 2 tablets (650 mg total) by mouth every 6 (six) hours as needed for mild pain, fever or headache. 06/12/21   Angiulli, Lavon Paganini, PA-C  albuterol (PROVENTIL HFA) 108 (90 Base) MCG/ACT inhaler Inhale 2 puffs into the lungs every 4 (four) hours as needed for wheezing or shortness of breath. 08/10/21   Claretta Fraise, MD  amLODipine (NORVASC) 10 MG tablet TAKE 1 TABLET BY MOUTH EVERY DAY 09/02/21   Claretta Fraise, MD  b complex-vitamin c-folic acid (NEPHRO-VITE) 0.8 MG TABS tablet TAKE 1 TABLET BY MOUTH EVERYDAY AT BEDTIME 08/31/21   Claretta Fraise, MD  budesonide (PULMICORT) 0.25 MG/2ML nebulizer solution USE 1 VIAL  IN  NEBULIZER TWICE  DAILY - rinse mouth after treatment 07/30/21   Claretta Fraise, MD  Cholecalciferol (VITAMIN D3) 125 MCG (5000 UT) CAPS Take 1 capsule (5,000 Units total) by mouth daily. 06/12/21   Angiulli, Lavon Paganini, PA-C  furosemide (LASIX) 40 MG tablet Take 40 mg by mouth daily.    [provider]  ipratropium-albuterol (DUONEB) 0.5-2.5 (3) MG/3ML SOLN USE 1 VIAL IN NEBULIZER 4 TIMES DAILY 08/31/21   Claretta Fraise, MD  levothyroxine (SYNTHROID) 112 MCG tablet Take 1 tablet (112 mcg total) by mouth daily before breakfast. 06/12/21   Angiulli, Lavon Paganini, PA-C  loratadine (CLARITIN) 10 MG tablet Take 10 mg by mouth daily as needed (for allergies.).     [provider]  nitroGLYCERIN (NITROSTAT) 0.4 MG SL tablet Place 1 tablet (0.4 mg total) under the tongue every 5 (five) minutes as needed for chest pain. 03/18/17   Shirley Friar, PA-C  OXYGEN Inhale into the lungs at bedtime.    [provider]  pravastatin (PRAVACHOL) 40 MG tablet TAKE 1 TABLET BY MOUTH DAILY AT 6 PM. 09/02/21   Claretta Fraise, MD    Allergies    Patient has no known allergies.  Review of Systems   Review of Systems  Constitutional:  Negative for chills and fever.  HENT:  Negative for ear pain and sore throat.   Eyes:  Negative for pain and visual disturbance.  Respiratory:  Negative for cough and shortness of breath.   Cardiovascular:  Negative for chest pain and palpitations.  Gastrointestinal:  Positive for abdominal pain. Negative for vomiting.  Genitourinary:  Positive for flank pain. Negative for dysuria and hematuria.  Musculoskeletal:  Negative for arthralgias and back pain.  Skin:  Negative for color change and rash.  Neurological:  Negative for seizures and syncope.  All other systems reviewed and are negative.  Physical Exam Updated Vital Signs BP (!) 149/62 (BP Location: Left Arm)   Pulse 70   Temp 97.7 F (36.5 C) (Oral)   Resp 16   SpO2 95%   Physical Exam Vitals and nursing note reviewed.  Constitutional:      General: He is not in acute distress.    Appearance: He is well-developed.  HENT:     Head: Normocephalic and atraumatic.  Eyes:     Conjunctiva/sclera: Conjunctivae normal.     Pupils: Pupils are equal, round, and reactive to light.  Cardiovascular:     Rate and Rhythm: Normal rate and regular rhythm.     Heart sounds: No murmur heard. Pulmonary:     Effort: Pulmonary effort is normal. No respiratory distress.     Breath sounds: Normal breath sounds.  Abdominal:     General: There is no distension.     Palpations: Abdomen is soft.     Tenderness: There is no abdominal tenderness. There is no guarding.  Musculoskeletal:        General: No swelling, deformity or signs of injury.     Cervical back: Neck supple.     Right lower leg: Edema present.     Left lower leg: Edema present.     Comments: 1+ ankle edema bilaterally.  Skin:    General: Skin is warm and dry.     Capillary Refill: Capillary refill takes less than 2 seconds.     Findings: No lesion or  rash.  Neurological:     General: No focal deficit present.     Mental Status: He is alert. Mental status is at baseline.     Comments: 5 out of 5 strength in the lower extremities bilaterally  Psychiatric:        Mood and Affect: Mood normal.    ED Results / Procedures / Treatments   Labs (all labs ordered are listed, but only abnormal results are displayed) Labs Reviewed  CBC WITH DIFFERENTIAL/PLATELET - Abnormal; Notable for the following components:      Result Value   WBC 11.6 (*)    RBC 3.24 (*)    Hemoglobin 10.3 (*)    HCT 32.2 (*)    Neutro Abs 9.2 (*)    Monocytes Absolute 1.1 (*)    All other components within normal limits  COMPREHENSIVE METABOLIC PANEL - Abnormal; Notable for the following components:   Sodium 130 (*)    Chloride 96 (*)    Creatinine, Ser 2.72 (*)    Calcium 8.8 (*)    Total Protein 6.0 (*)    Albumin 3.1 (*)    Total Bilirubin 0.2 (*)    GFR, Estimated 23 (*)    All other components within normal limits  LIPASE, BLOOD    EKG None  Radiology CT ABDOMEN PELVIS WO CONTRAST  Result Date: 09/10/2021 CLINICAL DATA:  80 year old male with upper quadrant abdominal pain radiating flank x2 days. Possible kidney stone. EXAM: CT ABDOMEN AND PELVIS WITHOUT CONTRAST TECHNIQUE: Multidetector CT imaging of the abdomen and pelvis was performed following the standard protocol without IV contrast. COMPARISON:  Noncontrast CT Abdomen and Pelvis 07/23/2021 and earlier. FINDINGS: Lower chest: Increased now moderate large layering pleural effusions at bases. Stable mild cardiomegaly and partially visible cardiac pacemaker. No pericardial effusion. Compressive atelectasis at both bases. Hepatobiliary: Negative noncontrast liver. Contracted gallbladder stable since October. Pancreas: Negative. Spleen: Diminutive, negative. Adrenals/Urinary Tract: Adrenal glands. Partial left renal atrophy appears stable. Continued highly abnormal appearance of the right kidney which is  indistinct and mildly enlarged with associated similar indistinct, masslike soft tissue enlargement in the right retroperitoneum apparently encasing the right main renal artery (31 x 50 mm image 52 and series 3, image 28) which appears mildly larger since October. Renal vascular and/or collecting system calcifications not significantly changed. No right hydroureter. But there is additional right retroperitoneal lymphadenopathy measuring 13 mm short axis at the level of the lower pole (series 3, image 41). Diminutive, negative appearing urinary bladder. Stomach/Bowel: Redundant large bowel, with loops intermittently decompressed with moderate retained stool (transverse colon, right colon). No dilated small bowel. No convincing large or small bowel inflammation. Postoperative changes to the midline ventral abdominal wall. Stomach and duodenum within normal limits. No free air. No abdominal fluid. Vascular/Lymphatic: Diffuse severe calcified atherosclerosis including Aortoiliac calcified atherosclerosis. Vascular patency is not evaluated in the absence of IV contrast. Abnormal right retroperitoneal nodes as above. No other this noncontrast exam. Reproductive: Negative. Other: There is a small new volume pelvic free fluid centrally (series three image sixty) with simple fluid density. Nonspecific presacral inflammatory stranding has significantly changed. Musculoskeletal: Advanced disc degeneration in lumbar. Stable visualized osseous structures. No acute or suspicious lesion. IMPRESSION: 1. Ongoing right nephromegaly with indistinct ( "face-less") right kidney, and progressive adjacent right retroperitoneal soft tissue  mass/lymphadenopathy which appears to be encasing the right main renal vessels. 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 No strong evidence of obstructive uropathy. 2. Trace free fluid now in the pelvis, nonspecific. 3. Increased and now moderate to large layering pleural effusions at both  lung bases. 4. Severe calcified atherosclerosis, Aortic Atherosclerosis (ICD10-I70.0). Electronically Signed   By: Genevie Ann M.D.   On: 09/10/2021 10:21   DG Chest 2 View  Result Date: 09/10/2021 CLINICAL DATA:  Right side chest pain EXAM: CHEST - 2 VIEW COMPARISON:  06/08/2021 FINDINGS: Right dialysis catheter and left AICD remain in place, unchanged. Heart is borderline in size. Bilateral effusions. Diffuse right lung airspace disease and left lower lobe opacity. Findings could reflect asymmetric edema or infection. IMPRESSION: Bilateral effusions. Right greater than left airspace disease which could reflect asymmetric edema or infection. Electronically Signed   By: Rolm Baptise M.D.   On: 09/10/2021 10:00    Procedures Procedures   Medications Ordered in ED Medications - No data to display  ED Course  I have reviewed the triage vital signs and the nursing notes.  Pertinent labs & imaging results that were available during my care of the patient were reviewed by me and considered in my medical decision making (see chart for details).    MDM Rules/Calculators/A&P                           80 year old male with medical history significant for CAD, CHF, ESRD on HD, COPD on 2 L O2 at night, GERD, HLD presenting to the emergency department with abdominal pain.  The patient states that he has had abdominal pain for the last 2 to 3 days.  Pain has been intermittent and severe, particularly last night.  He endorses severe right sided and right flank abdominal pain that has come and gone during that time.  He endorses a prior history of this and had a CT scan in October which showed some lymphadenopathy surrounding his renal vasculature.  He follows [facility] for this and has been discussing the possibility of a biopsy.  He denies any hematuria, dysuria, fever or chills.  He is currently pain-free.  No aggravating or alleviating symptoms.  On arrival, the patient was afebrile,  hemodynamically stable, with a reassuring physical exam.  The patient had a mild leukocytosis but has no evidence of sepsis and has no symptoms of dysuria at this time.  His pain is completely resolved at this time.   A CT abdomen pelvis was performed which revealed ongoing right nephromegaly with indistinct right kidney, progressive adjacent right retroperitoneal soft tissue mass/lymphadenopathy which appears to be encasing the right main renal vessels.  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  No evidence of obstructive uropathy, trace free fluid in the pelvis nonspecific.   Patient was unable to provide a urinary sample but has no urinary symptoms at this time.  Low suspicion for UTI, pyelonephritis.  No evidence for nephrolithiasis or obstructive uropathy at this time.  Patient overall well-appearing and asymptomatic.  Right flank pain without midline spinal tenderness, no recent trauma, no red flag back symptoms with 5 out of 5 strength bilaterally.  No evidence for any acute intra-abdominal abnormality at this time.  Will prescribe a short course of Norco for pain control and have the patient follow-up with his urologist as I suspect his symptoms are likely related to his retroperitoneal mass and lymphadenopathy.  Stable  for discharge at this time.  Final Clinical Impression(s) / ED Diagnoses Final diagnoses:  Right lower quadrant abdominal pain  Flank pain  Lymphadenopathy    Rx / DC Orders ED Discharge Orders          Ordered    HYDROcodone-acetaminophen (NORCO/VICODIN) 5-325 MG tablet  Every 6 hours PRN        09/10/21 1651             Regan Lemming, MD 09/10/21 1656

## 2021-09-10 NOTE — Discharge Instructions (Signed)
You were evaluated in the Emergency Department and after careful evaluation, we did not find any emergent condition requiring admission or further testing in the hospital.  Your exam/testing today was overall reassuring.  Your CT of the abdomen pelvis revealed no evidence of obstruction or kidney stone.  You have no symptoms of urinary tract infection and your vitals are reassuring.  Your CT did show evidence of continued lymphadenopathy around your right kidney which likely warrants biopsy.  Recommend you follow-up tomorrow via phone call with your urologist to discuss further options.  In the interim, a short course of Norco has been prescribed for pain control.  Please return to the Emergency Department if you experience any worsening of your condition.  Thank you for allowing [place] to be a part of your care.

## 2021-09-10 NOTE — ED Provider Notes (Signed)
Emergency Medicine Provider Triage Evaluation Note  [name] [name] , a 80 y.o. male  was evaluated in triage.  Pt complains of abdominal pain for 2 to 3 days.  Pain is intermittent, was severe last night.  Mostly on the right side, occasionally radiates to the left.  Mostly in the back.  No fevers, chills, nausea, vomiting, diarrhea.  No cough or shortness of breath. On dialysis through port (has been on dialysis for a few months). Still makes urine, it has been normal for him. Last dialysis session was yesterday.   Review of Systems  Positive: Abd pain Negative: fever  Physical Exam  BP (!) 146/62 (BP Location: Left Arm)   Pulse 78   Temp 97.7 F (36.5 C) (Oral)   Resp 18   SpO2 100%  Gen:   Awake, no distress   Resp:  Normal effort  MSK:   Moves extremities without difficulty  Other:  TTP of R flank and R side abd. Mild ttp of L low back.   Medical Decision Making  Medically screening exam initiated at 9:34 AM.  Appropriate orders placed.  [name] [name] was informed that the remainder of the evaluation will be completed by another provider, this initial triage assessment does not replace that evaluation, and the importance of remaining in the ED until their evaluation is complete.  Labs, ua, ct without (due to new dialysis pt)   Franchot Heidelberg, PA-C 09/10/21 [id number]    Lucrezia Starch, MD 09/10/21 1019

## 2021-09-11 DIAGNOSIS — Z992 Dependence on renal dialysis: Secondary | ICD-10-CM | POA: Diagnosis not present

## 2021-09-11 DIAGNOSIS — N2581 Secondary hyperparathyroidism of renal origin: Secondary | ICD-10-CM | POA: Diagnosis not present

## 2021-09-11 DIAGNOSIS — D649 Anemia, unspecified: Secondary | ICD-10-CM | POA: Diagnosis not present

## 2021-09-11 DIAGNOSIS — N178 Other acute kidney failure: Secondary | ICD-10-CM | POA: Diagnosis not present

## 2021-09-11 DIAGNOSIS — D689 Coagulation defect, unspecified: Secondary | ICD-10-CM | POA: Diagnosis not present

## 2021-09-14 DIAGNOSIS — I5022 Chronic systolic (congestive) heart failure: Secondary | ICD-10-CM | POA: Diagnosis not present

## 2021-09-14 DIAGNOSIS — D509 Iron deficiency anemia, unspecified: Secondary | ICD-10-CM | POA: Diagnosis not present

## 2021-09-14 DIAGNOSIS — R52 Pain, unspecified: Secondary | ICD-10-CM | POA: Diagnosis not present

## 2021-09-14 DIAGNOSIS — N186 End stage renal disease: Secondary | ICD-10-CM | POA: Diagnosis not present

## 2021-09-14 DIAGNOSIS — D689 Coagulation defect, unspecified: Secondary | ICD-10-CM | POA: Diagnosis not present

## 2021-09-14 DIAGNOSIS — J449 Chronic obstructive pulmonary disease, unspecified: Secondary | ICD-10-CM | POA: Diagnosis not present

## 2021-09-14 DIAGNOSIS — D649 Anemia, unspecified: Secondary | ICD-10-CM | POA: Diagnosis not present

## 2021-09-14 DIAGNOSIS — N2581 Secondary hyperparathyroidism of renal origin: Secondary | ICD-10-CM | POA: Diagnosis not present

## 2021-09-14 DIAGNOSIS — Z992 Dependence on renal dialysis: Secondary | ICD-10-CM | POA: Diagnosis not present

## 2021-09-15 ENCOUNTER — Encounter (HOSPITAL_COMMUNITY): Payer: Self-pay

## 2021-09-15 DIAGNOSIS — J449 Chronic obstructive pulmonary disease, unspecified: Secondary | ICD-10-CM | POA: Diagnosis not present

## 2021-09-15 NOTE — Progress Notes (Signed)
[name] [name], [name] [name] Legal Sex  Male DOB  Nov 20, 1940 SSN  [id number] Address  [place] [address] Phone  [phone] (Home) *[phone] (Mobile)    RE: [place] CORE BIOPSY (LYMPH NODES) Received: 1 month ago Sandi Mariscal, MD  Valli Glance OK for CT guided [name] RP LN Bx.   CT A&P - image 57, series 2.   Cathren Harsh        Previous Messages   ----- Message -----  From: Valli Glance  Sent: 08/10/2021   3:49 PM EST  To: Sandi Mariscal, MD  Subject: [place] CORE BIOPSY (LYMPH NODES)                   [place] CORE BIOPSY (LYMPH NODES)     Reason:Localized enlarged lymph nodes     Per Dr Claudia Desanctis  CC: Dr Pascal Lux (cased discussed with him)     Provider: Jacalyn Lefevre D     Contact:[phone]

## 2021-09-16 DIAGNOSIS — N186 End stage renal disease: Secondary | ICD-10-CM | POA: Diagnosis not present

## 2021-09-16 DIAGNOSIS — D689 Coagulation defect, unspecified: Secondary | ICD-10-CM | POA: Diagnosis not present

## 2021-09-16 DIAGNOSIS — R52 Pain, unspecified: Secondary | ICD-10-CM | POA: Diagnosis not present

## 2021-09-16 DIAGNOSIS — Z992 Dependence on renal dialysis: Secondary | ICD-10-CM | POA: Diagnosis not present

## 2021-09-16 DIAGNOSIS — D509 Iron deficiency anemia, unspecified: Secondary | ICD-10-CM | POA: Diagnosis not present

## 2021-09-16 DIAGNOSIS — D649 Anemia, unspecified: Secondary | ICD-10-CM | POA: Diagnosis not present

## 2021-09-16 DIAGNOSIS — N2581 Secondary hyperparathyroidism of renal origin: Secondary | ICD-10-CM | POA: Diagnosis not present

## 2021-09-18 DIAGNOSIS — D649 Anemia, unspecified: Secondary | ICD-10-CM | POA: Diagnosis not present

## 2021-09-18 DIAGNOSIS — N2581 Secondary hyperparathyroidism of renal origin: Secondary | ICD-10-CM | POA: Diagnosis not present

## 2021-09-18 DIAGNOSIS — R52 Pain, unspecified: Secondary | ICD-10-CM | POA: Diagnosis not present

## 2021-09-18 DIAGNOSIS — D509 Iron deficiency anemia, unspecified: Secondary | ICD-10-CM | POA: Diagnosis not present

## 2021-09-18 DIAGNOSIS — Z992 Dependence on renal dialysis: Secondary | ICD-10-CM | POA: Diagnosis not present

## 2021-09-18 DIAGNOSIS — D689 Coagulation defect, unspecified: Secondary | ICD-10-CM | POA: Diagnosis not present

## 2021-09-18 DIAGNOSIS — N186 End stage renal disease: Secondary | ICD-10-CM | POA: Diagnosis not present

## 2021-09-21 ENCOUNTER — Other Ambulatory Visit: Payer: Self-pay | Admitting: *Deleted

## 2021-09-21 ENCOUNTER — Other Ambulatory Visit (HOSPITAL_COMMUNITY): Payer: Self-pay | Admitting: Physician Assistant

## 2021-09-21 DIAGNOSIS — D689 Coagulation defect, unspecified: Secondary | ICD-10-CM | POA: Diagnosis not present

## 2021-09-21 DIAGNOSIS — Z992 Dependence on renal dialysis: Secondary | ICD-10-CM | POA: Diagnosis not present

## 2021-09-21 DIAGNOSIS — R52 Pain, unspecified: Secondary | ICD-10-CM | POA: Diagnosis not present

## 2021-09-21 DIAGNOSIS — D509 Iron deficiency anemia, unspecified: Secondary | ICD-10-CM | POA: Diagnosis not present

## 2021-09-21 DIAGNOSIS — E038 Other specified hypothyroidism: Secondary | ICD-10-CM

## 2021-09-21 DIAGNOSIS — N2581 Secondary hyperparathyroidism of renal origin: Secondary | ICD-10-CM | POA: Diagnosis not present

## 2021-09-21 DIAGNOSIS — D649 Anemia, unspecified: Secondary | ICD-10-CM | POA: Diagnosis not present

## 2021-09-21 DIAGNOSIS — N186 End stage renal disease: Secondary | ICD-10-CM | POA: Diagnosis not present

## 2021-09-21 MED ORDER — AMLODIPINE BESYLATE 10 MG PO TABS: 10.0000 mg | ORAL_TABLET | Freq: Every day | ORAL | 1 refills | Status: AC

## 2021-09-21 MED ORDER — LEVOTHYROXINE SODIUM 112 MCG PO TABS: 112.0000 ug | ORAL_TABLET | Freq: Every day | ORAL | 3 refills | Status: AC

## 2021-09-21 MED ORDER — PRAVASTATIN SODIUM 40 MG PO TABS: ORAL_TABLET | ORAL | 1 refills | Status: AC

## 2021-09-21 MED ORDER — NEPHRO-VITE 0.8 MG PO TABS: ORAL_TABLET | ORAL | 1 refills | Status: AC

## 2021-09-21 MED ORDER — FUROSEMIDE 40 MG PO TABS: 40.0000 mg | ORAL_TABLET | Freq: Every day | ORAL | 1 refills | Status: AC

## 2021-09-21 NOTE — Telephone Encounter (Signed)
Fax RF request from [facility], call to Pt's home, wife did verify that they will be using this mail order pharmacy

## 2021-09-22 ENCOUNTER — Ambulatory Visit (HOSPITAL_COMMUNITY)
Admission: RE | Admit: 2021-09-22 | Discharge: 2021-09-22 | Disposition: A | Discharge status: Home / Self Care | Payer: Medicare Other | Source: Ambulatory Visit | Attending: Urology | Admitting: Urology

## 2021-09-22 ENCOUNTER — Other Ambulatory Visit: Payer: Self-pay

## 2021-09-22 DIAGNOSIS — J449 Chronic obstructive pulmonary disease, unspecified: Secondary | ICD-10-CM | POA: Insufficient documentation

## 2021-09-22 DIAGNOSIS — I700 Atherosclerosis of aorta: Secondary | ICD-10-CM | POA: Insufficient documentation

## 2021-09-22 DIAGNOSIS — I4891 Unspecified atrial fibrillation: Secondary | ICD-10-CM | POA: Diagnosis not present

## 2021-09-22 DIAGNOSIS — I509 Heart failure, unspecified: Secondary | ICD-10-CM | POA: Diagnosis not present

## 2021-09-22 DIAGNOSIS — D649 Anemia, unspecified: Secondary | ICD-10-CM | POA: Insufficient documentation

## 2021-09-22 DIAGNOSIS — Z950 Presence of cardiac pacemaker: Secondary | ICD-10-CM | POA: Insufficient documentation

## 2021-09-22 DIAGNOSIS — E785 Hyperlipidemia, unspecified: Secondary | ICD-10-CM | POA: Insufficient documentation

## 2021-09-22 DIAGNOSIS — M109 Gout, unspecified: Secondary | ICD-10-CM | POA: Insufficient documentation

## 2021-09-22 DIAGNOSIS — R590 Localized enlarged lymph nodes: Secondary | ICD-10-CM | POA: Insufficient documentation

## 2021-09-22 DIAGNOSIS — K219 Gastro-esophageal reflux disease without esophagitis: Secondary | ICD-10-CM | POA: Insufficient documentation

## 2021-09-22 DIAGNOSIS — C480 Malignant neoplasm of retroperitoneum: Secondary | ICD-10-CM | POA: Diagnosis not present

## 2021-09-22 DIAGNOSIS — E039 Hypothyroidism, unspecified: Secondary | ICD-10-CM | POA: Insufficient documentation

## 2021-09-22 DIAGNOSIS — N183 Chronic kidney disease, stage 3 unspecified: Secondary | ICD-10-CM | POA: Insufficient documentation

## 2021-09-22 DIAGNOSIS — I2510 Atherosclerotic heart disease of native coronary artery without angina pectoris: Secondary | ICD-10-CM | POA: Insufficient documentation

## 2021-09-22 DIAGNOSIS — K31819 Angiodysplasia of stomach and duodenum without bleeding: Secondary | ICD-10-CM | POA: Diagnosis not present

## 2021-09-22 DIAGNOSIS — Z992 Dependence on renal dialysis: Secondary | ICD-10-CM | POA: Diagnosis not present

## 2021-09-22 MED ORDER — MIDAZOLAM HCL 2 MG/2ML IJ SOLN
INTRAMUSCULAR | Status: AC | PRN
  Administered 2021-09-22: .5 mg via INTRAVENOUS
  Administered 2021-09-22 (×2): 1 mg via INTRAVENOUS

## 2021-09-22 MED ORDER — FENTANYL CITRATE (PF) 100 MCG/2ML IJ SOLN
INTRAMUSCULAR | Status: AC
  Filled 2021-09-22: qty 4

## 2021-09-22 MED ORDER — FENTANYL CITRATE (PF) 100 MCG/2ML IJ SOLN
INTRAMUSCULAR | Status: AC | PRN
  Administered 2021-09-22 (×2): 50 ug via INTRAVENOUS
  Administered 2021-09-22: 25 ug via INTRAVENOUS

## 2021-09-22 MED ORDER — MIDAZOLAM HCL 2 MG/2ML IJ SOLN
INTRAMUSCULAR | Status: AC
  Filled 2021-09-22: qty 4

## 2021-09-22 MED ORDER — SODIUM CHLORIDE 0.9 % IV SOLN: INTRAVENOUS | Status: DC

## 2021-09-22 NOTE — Procedures (Signed)
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    EBL: None.   Complications: None immediate.   Ronny Bacon, MD Pager #: [phone]

## 2021-09-22 NOTE — H&P (Signed)
Referring Physician(s): Pace,Maryellen D  Supervising Physician: Sandi Mariscal  Patient Status:  [facility] OP  Chief Complaint:  "I'm here for a biopsy"  Subjective: Pt familiar to IR service from perc GB drain placement in 2016, and HD cath placement on 05/27/21. He has a PMH sig for AICD placement, anemia, asthma, afib, CAD, prior CABG, CHF, COPD, CKD- receiving HD,GERD, gastric antral vascular ectasia, gout, HH, HLD, hypothyroidism. Recent imaging has revealed:  1. 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 No strong evidence of obstructive uropathy. 2. Trace free fluid now in the pelvis, nonspecific. 3. Increased and now moderate to large layering pleural effusions at both lung bases. 4. Severe calcified atherosclerosis, Aortic Atherosclerosis  He presents today for image guided RP LN /mass bx for further evaluation.He denies fever,HA,CP,cough, N/V or bleeding. He does have abd/back discomfort and dyspnea.   Past Medical History:  Diagnosis Date   AICD (automatic cardioverter/defibrillator) present 04/05/2017   Anemia    Arthritis    "hips; back" (07/09/2014)   Asthma    Atrial fibrillation (HCC)    CAD (coronary artery disease)    CHF (congestive heart failure) ([place])    Cholecystitis 11/2013   CKD (chronic kidney disease) 2015   Stage  3.    COPD (chronic obstructive pulmonary disease) (HCC)    on home oxygen, 2 liters at night10/2015   Esophageal reflux    Gallstones    Gastric antral vascular ectasia 2013   Gout    Heme positive stool    Hepatomegaly    Hiatal hernia    History of blood transfusion ~ 2012   "blood count dropped; had to get 3 units"   Hyperlipidemia    Hypothyroidism    Other and unspecified coagulation defects    Personal history  of colonic polyps 05/29/2010   TUBULAR ADENOMA   Unspecified essential hypertension    Past Surgical History:  Procedure Laterality Date   CARDIAC CATHETERIZATION N/A 07/15/2016   Procedure: Right/Left Heart Cath and Coronary/Graft Angiography;  Surgeon: Belva Crome, MD;  Location: [facility];  Service: Cardiovascular;  Laterality: N/A;   cholecystomy tube  12/28/2014 - 01/06/2015   tube clogged with debris, removed and IR unable to place new tube.    COLONOSCOPY  2013   Dr. Sharlett Iles: no polyps or evidence of active bleeding   CORONARY ANGIOPLASTY WITH STENT PLACEMENT  ~ 2008; 07/08/2014   "1 + 3"   CORONARY ARTERY BYPASS GRAFT  1998   CABG X4   ESOPHAGOGASTRODUODENOSCOPY  2013   Dr. 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 Sharlett Iles: AVM at 64 and blood at 30 minutes beyond first duodenal image but not actual lesion seen. If persistent IDA, bleeding, recommend enteroscopy with ablation    HERNIA REPAIR     ICD IMPLANT N/A 04/05/2017   Procedure: ICD Implant;  Surgeon: Evans Lance, MD;  Location: [facility];  Service: Cardiovascular;  Laterality: N/A;   IR FLUORO GUIDE CV LINE RIGHT  05/27/2021   IR FLUORO GUIDE CV LINE RIGHT  06/02/2021   IR US GUIDE  VASC ACCESS RIGHT  05/27/2021   LEFT AND RIGHT HEART CATHETERIZATION WITH CORONARY/GRAFT ANGIOGRAM N/A 07/09/2014   Right and left heart cath, bare metal stent to SVG to RCA. Sinclair Grooms, MD;    UMBILICAL HERNIA REPAIR  [phone]      Allergies: Patient has no known allergies.  Medications: Prior to Admission medications   Medication Sig Start Date End Date Taking? Authorizing Provider  acetaminophen (TYLENOL) 325 MG tablet Take 2 tablets (650 mg total) by  mouth every 6 (six) hours as needed for mild pain, fever or headache. 06/12/21   Angiulli, Lavon Paganini, PA-C  albuterol (PROVENTIL HFA) 108 (90 Base) MCG/ACT inhaler Inhale 2 puffs into the lungs every 4 (four) hours as needed for wheezing or shortness of breath. 08/10/21   Claretta Fraise, MD  amLODipine (NORVASC) 10 MG tablet Take 1 tablet (10 mg total) by mouth daily. 09/21/21   Claretta Fraise, MD  b complex-vitamin c-folic acid (NEPHRO-VITE) 0.8 MG TABS tablet TAKE 1 TABLET BY MOUTH EVERYDAY AT BEDTIME 09/21/21   Claretta Fraise, MD  budesonide (PULMICORT) 0.25 MG/2ML nebulizer solution USE 1 VIAL  IN  NEBULIZER TWICE  DAILY - rinse mouth after treatment 07/30/21   Claretta Fraise, MD  Cholecalciferol (VITAMIN D3) 125 MCG (5000 UT) CAPS Take 1 capsule (5,000 Units total) by mouth daily. 06/12/21   Angiulli, Lavon Paganini, PA-C  furosemide (LASIX) 40 MG tablet Take 1 tablet (40 mg total) by mouth daily. 09/21/21   Claretta Fraise, MD  HYDROcodone-acetaminophen (NORCO/VICODIN) 5-325 MG tablet Take 1-2 tablets by mouth every 6 (six) hours as needed. 09/10/21   Regan Lemming, MD  ipratropium-albuterol (DUONEB) 0.5-2.5 (3) MG/3ML SOLN USE 1 VIAL IN NEBULIZER 4 TIMES DAILY 08/31/21   Claretta Fraise, MD  levothyroxine (SYNTHROID) 112 MCG tablet Take 1 tablet (112 mcg total) by mouth daily before breakfast. 09/21/21   Claretta Fraise, MD  loratadine (CLARITIN) 10 MG tablet Take 10 mg by mouth daily as needed (for allergies.).     [provider]  nitroGLYCERIN (NITROSTAT) 0.4 MG SL tablet Place 1 tablet (0.4 mg total) under the tongue every 5 (five) minutes as needed for chest pain. 03/18/17   Shirley Friar, PA-C  OXYGEN Inhale into the lungs at bedtime.    [provider]  pravastatin (PRAVACHOL) 40 MG tablet TAKE 1 TABLET BY MOUTH DAILY AT 6 PM. 09/21/21   Claretta Fraise, MD     Vital Signs: BP (!) 153/74    Pulse 71    Temp 97.9 F (36.6 C) (Oral)    Resp 15    Ht 5\' 8"  (1.727 m)    Wt 150  lb (68 kg)    SpO2 95%    BMI 22.81 kg/m   Physical Exam awake/alert; chest- dim BS bases; left chest wall AICD/pacer, rt chest HD cath; heart- reg rate, occ ectopy; abd- soft,+BS,currently NT; no LE edema  Imaging: No results found.  Labs:  CBC: Recent Labs    06/13/21 0806 06/19/21 1430 07/30/21 1424 09/10/21 0935  WBC 10.2 11.0* 11.1* 11.6*  HGB 7.4* 8.6* 9.5* 10.3*  HCT 24.2* 27.1* 28.8* 32.2*  PLT 241 262 299 288    COAGS: Recent Labs    05/23/21 0521 05/24/21 0859 06/07/21 0500 06/08/21 0816 06/09/21 0513 06/10/21 0500  INR 2.7*   < > 1.8* 1.7* 2.0* 1.9*  APTT 61*  --   --   --   --   --    < > =  values in this interval not displayed.    BMP: Recent Labs    06/11/21 0447 06/13/21 0806 06/19/21 1430 07/30/21 1424 09/10/21 0935  NA 135 133* 132* 136 130*  K 3.5 4.1 3.5 4.2 4.1  CL 97* 99 94* 98 96*  CO2 29 26 28 24 24   GLUCOSE 91 73 94 91 86  BUN 14 19 10 12 15   CALCIUM 8.7* 8.1* 8.9 8.7 8.8*  CREATININE 4.22* 4.66* 3.02* 2.44* 2.72*  GFRNONAA 13* 12* 20*  --  23*    LIVER FUNCTION TESTS: Recent Labs    05/23/21 0030 05/23/21 0521 05/24/21 1014 06/11/21 0447 06/13/21 0806 07/30/21 1424 09/10/21 0935  BILITOT 0.6 0.6  --   --   --  0.4 0.2*  AST 14* 13*  --   --   --  18 19  ALT 9 7  --   --   --  6 8  ALKPHOS 38 33*  --   --   --  76 57  PROT 6.0* 5.3*  --   --   --  5.8* 6.0*  ALBUMIN 3.0* 2.8*   < > 2.4* 2.5* 3.5* 3.1*   < > = values in this interval not displayed.    Assessment and Plan: Pt familiar to IR service from perc GB drain placement in 2016, and HD cath placement on 05/27/21. He has a PMH sig for AICD placement, anemia, asthma, afib, CAD, prior CABG, CHF, COPD, CKD- receiving HD,GERD, gastric antral vascular ectasia, gout, HH, HLD, hypothyroidism. Recent imaging has revealed:  1. 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 No strong evidence of obstructive uropathy. 2. Trace free fluid now in the pelvis, nonspecific. 3. Increased and now moderate to large layering pleural effusions at both lung bases. 4. Severe calcified atherosclerosis, Aortic Atherosclerosis  He presents today for image guided RP LN /mass bx for further evaluation.Risks and benefits of procedure was discussed with the patient /spouse including, but not limited to bleeding, infection, damage to adjacent structures or low yield requiring additional tests.  All of the questions were answered and there is agreement to proceed.  Consent signed and in chart.    Electronically Signed: D. Rowe Robert, PA-C 09/22/2021, 10:18 AM   I spent a total of 25 Minutes at the the patient's bedside AND on the patient's hospital floor or unit, greater than 50% of which was counseling/coordinating care for image guided retroperitoneal lymph node/mass biopsy

## 2021-09-23 ENCOUNTER — Encounter: Payer: Self-pay | Admitting: Vascular Surgery

## 2021-09-23 ENCOUNTER — Other Ambulatory Visit: Payer: Self-pay

## 2021-09-23 ENCOUNTER — Ambulatory Visit (INDEPENDENT_AMBULATORY_CARE_PROVIDER_SITE_OTHER): Payer: Medicare Other | Admitting: Vascular Surgery

## 2021-09-23 VITALS — BP 138/84 | HR 73 | Temp 98.9°F | Wt 152.2 lb

## 2021-09-23 DIAGNOSIS — N186 End stage renal disease: Secondary | ICD-10-CM | POA: Diagnosis not present

## 2021-09-23 DIAGNOSIS — D509 Iron deficiency anemia, unspecified: Secondary | ICD-10-CM | POA: Diagnosis not present

## 2021-09-23 DIAGNOSIS — Z992 Dependence on renal dialysis: Secondary | ICD-10-CM | POA: Diagnosis not present

## 2021-09-23 DIAGNOSIS — D649 Anemia, unspecified: Secondary | ICD-10-CM | POA: Diagnosis not present

## 2021-09-23 DIAGNOSIS — N2581 Secondary hyperparathyroidism of renal origin: Secondary | ICD-10-CM | POA: Diagnosis not present

## 2021-09-23 DIAGNOSIS — R52 Pain, unspecified: Secondary | ICD-10-CM | POA: Diagnosis not present

## 2021-09-23 DIAGNOSIS — D689 Coagulation defect, unspecified: Secondary | ICD-10-CM | POA: Diagnosis not present

## 2021-09-23 NOTE — Progress Notes (Signed)
[facility]ist of [place]  Patient name: [name] [name] MRN: [id number] DOB: 1941-04-12 Sex: male  REASON FOR VISIT: Discuss access for hemodialysis  HPI: [name] [name] is a 80 y.o. male here today with his wife for discussion of access for hemodialysis.  He is well-known to our service from prior follow-up of severe bilateral asymptomatic carotid disease.  He remains asymptomatic from a carotid standpoint.  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  He has a right IJ tunneled hemodialysis catheter and has been on hemodialysis for approximately 3 months.  He dialyzes at the [facility] unit.  He is right-handed.  He does have a AICD in the left subclavian vein.  This is been present since 2018.  He is not on anticoagulation despite atrial fibrillation due to recurrent hematuria and is currently undergoing work-up of a renal mass.  Past Medical History:  Diagnosis Date   AICD (automatic cardioverter/defibrillator) present 04/05/2017   Anemia    Arthritis    "hips; back" (07/09/2014)   Asthma    Atrial fibrillation (HCC)    CAD (coronary artery disease)    CHF (congestive heart failure) ([place])    Cholecystitis 11/2013   CKD (chronic kidney disease) 2015   Stage  3.    COPD (chronic obstructive pulmonary disease) (HCC)    on home oxygen, 2 liters at night10/2015   Esophageal reflux    Gallstones    Gastric antral vascular ectasia 2013   Gout    Heme positive stool    Hepatomegaly    Hiatal hernia    History of blood transfusion ~ 2012   "blood count dropped; had to get 3 units"   Hyperlipidemia    Hypothyroidism    Other and unspecified coagulation defects    Personal history of colonic polyps 05/29/2010   TUBULAR ADENOMA   Unspecified essential hypertension     Family History  Problem Relation Age of Onset   Leukemia Mother    Kidney disease Mother        kidney removed    Heart attack Brother     Heart disease Brother    Heart disease Father    Stomach cancer Sister    Stomach cancer Sister    [name] [name] death Brother    Hypertension Other        family    Colon cancer Neg Hx     SOCIAL HISTORY: Social History   Tobacco Use   Smoking status: Former    Packs/day: 1.00    Years: 42.00    Pack years: 42.00    Types: Cigarettes    Quit date: 12/16/1996    Years since quitting: 24.7   Smokeless tobacco: Never  Substance Use Topics   Alcohol use: Yes    Alcohol/week: 1.0 standard drink    Types: 1 Cans of beer per week    Comment: 1 per month    No Known Allergies  Current Outpatient Medications  Medication Sig Dispense Refill   acetaminophen (TYLENOL) 325 MG tablet Take 2 tablets (650 mg total) by mouth every 6 (six) hours as needed for mild pain, fever or headache.     albuterol (PROVENTIL HFA) 108 (90 Base) MCG/ACT inhaler Inhale 2 puffs into the lungs every 4 (four) hours as needed for wheezing or shortness of breath. 54 g 3   amLODipine (NORVASC) 10 MG tablet Take 1 tablet (10 mg total) by mouth daily. 90 tablet 1   b  complex-vitamin c-folic acid (NEPHRO-VITE) 0.8 MG TABS tablet TAKE 1 TABLET BY MOUTH EVERYDAY AT BEDTIME 90 tablet 1   budesonide (PULMICORT) 0.25 MG/2ML nebulizer solution USE 1 VIAL  IN  NEBULIZER TWICE  DAILY - rinse mouth after treatment 6 mL 5   Cholecalciferol (VITAMIN D3) 125 MCG (5000 UT) CAPS Take 1 capsule (5,000 Units total) by mouth daily. 30 capsule 0   furosemide (LASIX) 40 MG tablet Take 1 tablet (40 mg total) by mouth daily. 90 tablet 1   HYDROcodone-acetaminophen (NORCO/VICODIN) 5-325 MG tablet Take 1-2 tablets by mouth every 6 (six) hours as needed. 10 tablet 0   ipratropium-albuterol (DUONEB) 0.5-2.5 (3) MG/3ML SOLN USE 1 VIAL IN NEBULIZER 4 TIMES DAILY 180 mL 5   levothyroxine (SYNTHROID) 112 MCG tablet Take 1 tablet (112 mcg total) by mouth daily before breakfast. 90 tablet 3   loratadine (CLARITIN) 10 MG tablet Take 10 mg by mouth daily  as needed (for allergies.).      nitroGLYCERIN (NITROSTAT) 0.4 MG SL tablet Place 1 tablet (0.4 mg total) under the tongue every 5 (five) minutes as needed for chest pain. 6 tablet 1   OXYGEN Inhale into the lungs at bedtime.     pravastatin (PRAVACHOL) 40 MG tablet TAKE 1 TABLET BY MOUTH DAILY AT 6 PM. 90 tablet 1   No current facility-administered medications for this visit.    REVIEW OF SYSTEMS:  [X]  denotes positive finding, [ ]  denotes negative finding Cardiac  Comments:  Chest pain or chest pressure:    Shortness of breath upon exertion:    Short of breath when lying flat:    Irregular heart rhythm: x       Vascular    Pain in calf, thigh, or hip brought on by ambulation:    Pain in feet at night that wakes you up from your sleep:     Blood clot in your veins:    Leg swelling:           PHYSICAL EXAM: Vitals:   09/23/21 1000  BP: 138/84  Pulse: 73  Temp: 98.9 F (37.2 C)  TempSrc: Skin  SpO2: 94%  Weight: 152 lb 3.2 oz (69 kg)    GENERAL: The patient is a well-nourished male, in no acute distress. The vital signs are documented above. CARDIOVASCULAR: He does have palpable right radial pulse.  The artery is very calcified by physical exam.  He has a 2-3+ right brachial pulse.  He does have a easily visualized cephalic vein in his forearm and this branches into multiple small branches above the wrist.  He has a large cephalic vein at the antecubital space. PULMONARY: There is good air exchange  MUSCULOSKELETAL: There are no major deformities or cyanosis. NEUROLOGIC: No focal weakness or paresthesias are detected. SKIN: There are no ulcers or rashes noted. PSYCHIATRIC: The patient has a normal affect.  DATA:  I imaged his right arm surface veins with SonoSite ultrasound.  The cephalic vein is of good caliber at the antecubital space and runs very superficial and straight in the upper arm.  MEDICAL ISSUES: Had a long discussion with the patient and his wife regarding  access for hemodialysis.  He is quite anxious to transition from his tunneled catheter.  I explained that we would not place access in his left arm due to his longstanding AICD.  He does appear to have adequate cephalic vein for fistula attempt.  This most likely will be brachiocephalic due to the calcified radial artery  on physical exam and also a small branching of his vein above the wrist.  Explained this would take 3 months for maturation before he could begin using this.  Also discussed the potential for not maturation.  Also discussed the potential risk for steal and that this would cause abandoning of his fistula.  He understands that after use of the fistula, he can have his catheter removed.  His catheter was placed in [place].  I explained that we would be able to remove this at [facility] as well to save him a trip to [place].  We will plan right arm AV access this coming Tuesday at [facility] on 09/29/2021    Rosetta Posner, MD FACS [facility] [phone]  Note: Portions of this report may have been transcribed using voice recognition software.  Every effort has been made to ensure accuracy; however, inadvertent computerized transcription errors may still be present.

## 2021-09-23 NOTE — H&P (View-Only) (Signed)
Vascular and Vein Specialist of [place]  Patient name: [name] [name] MRN: [id number] DOB: [birth year] Sex: male  REASON FOR VISIT: Discuss access for hemodialysis  HPI: [name] [name] is a 80 y.o. male here today with his wife for discussion of access for hemodialysis.  He is well-known to our service from prior follow-up of severe bilateral asymptomatic carotid disease.  He remains asymptomatic from a carotid standpoint.  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  He has a right IJ tunneled hemodialysis catheter and has been on hemodialysis for approximately 3 months.  He dialyzes at the [facility] unit.  He is right-handed.  He does have a AICD in the left subclavian vein.  This is been present since 2018.  He is not on anticoagulation despite atrial fibrillation due to recurrent hematuria and is currently undergoing work-up of a renal mass.  Past Medical History:  Diagnosis Date   AICD (automatic cardioverter/defibrillator) present 04/05/2017   Anemia    Arthritis    "hips; back" (07/09/2014)   Asthma    Atrial fibrillation (HCC)    CAD (coronary artery disease)    CHF (congestive heart failure) ([place])    Cholecystitis 11/2013   CKD (chronic kidney disease) 2015   Stage  3.    COPD (chronic obstructive pulmonary disease) (HCC)    on home oxygen, 2 liters at night10/2015   Esophageal reflux    Gallstones    Gastric antral vascular ectasia 2013   Gout    Heme positive stool    Hepatomegaly    Hiatal hernia    History of blood transfusion ~ 2012   "blood count dropped; had to get 3 units"   Hyperlipidemia    Hypothyroidism    Other and unspecified coagulation defects    Personal history of colonic polyps 05/29/2010   TUBULAR ADENOMA   Unspecified essential hypertension     Family History  Problem Relation Age of Onset   Leukemia Mother    Kidney disease Mother        kidney removed    Heart attack Brother     Heart disease Brother    Heart disease Father    Stomach cancer Sister    Stomach cancer Sister    Nikolay Demetriou death Brother    Hypertension Other        family    Colon cancer Neg Hx     SOCIAL HISTORY: Social History   Tobacco Use   Smoking status: Former    Packs/day: 1.00    Years: 42.00    Pack years: 42.00    Types: Cigarettes    Quit date: 12/16/1996    Years since quitting: 24.7   Smokeless tobacco: Never  Substance Use Topics   Alcohol use: Yes    Alcohol/week: 1.0 standard drink    Types: 1 Cans of beer per week    Comment: 1 per month    No Known Allergies  Current Outpatient Medications  Medication Sig Dispense Refill   acetaminophen (TYLENOL) 325 MG tablet Take 2 tablets (650 mg total) by mouth every 6 (six) hours as needed for mild pain, fever or headache.     albuterol (PROVENTIL HFA) 108 (90 Base) MCG/ACT inhaler Inhale 2 puffs into the lungs every 4 (four) hours as needed for wheezing or shortness of breath. 54 g 3   amLODipine (NORVASC) 10 MG tablet Take 1 tablet (10 mg total) by mouth daily. 90 tablet 1   b  complex-vitamin c-folic acid (NEPHRO-VITE) 0.8 MG TABS tablet TAKE 1 TABLET BY MOUTH EVERYDAY AT BEDTIME 90 tablet 1   budesonide (PULMICORT) 0.25 MG/2ML nebulizer solution USE 1 VIAL  IN  NEBULIZER TWICE  DAILY - rinse mouth after treatment 6 mL 5   Cholecalciferol (VITAMIN D3) 125 MCG (5000 UT) CAPS Take 1 capsule (5,000 Units total) by mouth daily. 30 capsule 0   furosemide (LASIX) 40 MG tablet Take 1 tablet (40 mg total) by mouth daily. 90 tablet 1   HYDROcodone-acetaminophen (NORCO/VICODIN) 5-325 MG tablet Take 1-2 tablets by mouth every 6 (six) hours as needed. 10 tablet 0   ipratropium-albuterol (DUONEB) 0.5-2.5 (3) MG/3ML SOLN USE 1 VIAL IN NEBULIZER 4 TIMES DAILY 180 mL 5   levothyroxine (SYNTHROID) 112 MCG tablet Take 1 tablet (112 mcg total) by mouth daily before breakfast. 90 tablet 3   loratadine (CLARITIN) 10 MG tablet Take 10 mg by mouth daily  as needed (for allergies.).      nitroGLYCERIN (NITROSTAT) 0.4 MG SL tablet Place 1 tablet (0.4 mg total) under the tongue every 5 (five) minutes as needed for chest pain. 6 tablet 1   OXYGEN Inhale into the lungs at bedtime.     pravastatin (PRAVACHOL) 40 MG tablet TAKE 1 TABLET BY MOUTH DAILY AT 6 PM. 90 tablet 1   No current facility-administered medications for this visit.    REVIEW OF SYSTEMS:  [X]  denotes positive finding, [ ]  denotes negative finding Cardiac  Comments:  Chest pain or chest pressure:    Shortness of breath upon exertion:    Short of breath when lying flat:    Irregular heart rhythm: x       Vascular    Pain in calf, thigh, or hip brought on by ambulation:    Pain in feet at night that wakes you up from your sleep:     Blood clot in your veins:    Leg swelling:           PHYSICAL EXAM: Vitals:   09/23/21 1000  BP: 138/84  Pulse: 73  Temp: 98.9 F (37.2 C)  TempSrc: Skin  SpO2: 94%  Weight: 152 lb 3.2 oz (69 kg)    GENERAL: The patient is a well-nourished male, in no acute distress. The vital signs are documented above. CARDIOVASCULAR: He does have palpable right radial pulse.  The artery is very calcified by physical exam.  He has a 2-3+ right brachial pulse.  He does have a easily visualized cephalic vein in his forearm and this branches into multiple small branches above the wrist.  He has a large cephalic vein at the antecubital space. PULMONARY: There is good air exchange  MUSCULOSKELETAL: There are no major deformities or cyanosis. NEUROLOGIC: No focal weakness or paresthesias are detected. SKIN: There are no ulcers or rashes noted. PSYCHIATRIC: The patient has a normal affect.  DATA:  I imaged his right arm surface veins with SonoSite ultrasound.  The cephalic vein is of good caliber at the antecubital space and runs very superficial and straight in the upper arm.  MEDICAL ISSUES: Had a long discussion with the patient and his wife regarding  access for hemodialysis.  He is quite anxious to transition from his tunneled catheter.  I explained that we would not place access in his left arm due to his longstanding AICD.  He does appear to have adequate cephalic vein for fistula attempt.  This most likely will be brachiocephalic due to the calcified radial artery  on physical exam and also a small branching of his vein above the wrist.  Explained this would take 3 months for maturation before he could begin using this.  Also discussed the potential for not maturation.  Also discussed the potential risk for steal and that this would cause abandoning of his fistula.  He understands that after use of the fistula, he can have his catheter removed.  His catheter was placed in [place].  I explained that we would be able to remove this at [facility] as well to save him a trip to [place].  We will plan right arm AV access this coming Tuesday at [facility] on 09/29/2021    Rosetta Posner, MD FACS [facility] Special[facility] of [facility] [phone]  Note: Portions of this report may have been transcribed using voice recognition software.  Every effort has been made to ensure accuracy; however, inadvertent computerized transcription errors may still be present.

## 2021-09-24 ENCOUNTER — Ambulatory Visit (INDEPENDENT_AMBULATORY_CARE_PROVIDER_SITE_OTHER): Payer: Medicare Other

## 2021-09-24 ENCOUNTER — Encounter: Payer: Self-pay | Admitting: Family Medicine

## 2021-09-24 ENCOUNTER — Other Ambulatory Visit: Payer: Self-pay | Admitting: Family Medicine

## 2021-09-24 ENCOUNTER — Ambulatory Visit (INDEPENDENT_AMBULATORY_CARE_PROVIDER_SITE_OTHER): Payer: Medicare Other | Admitting: Family Medicine

## 2021-09-24 VITALS — BP 139/60 | HR 87 | Temp 97.4°F

## 2021-09-24 DIAGNOSIS — M25561 Pain in right knee: Secondary | ICD-10-CM | POA: Diagnosis not present

## 2021-09-24 DIAGNOSIS — M160 Bilateral primary osteoarthritis of hip: Secondary | ICD-10-CM

## 2021-09-24 DIAGNOSIS — M170 Bilateral primary osteoarthritis of knee: Secondary | ICD-10-CM | POA: Diagnosis not present

## 2021-09-24 DIAGNOSIS — M25562 Pain in left knee: Secondary | ICD-10-CM | POA: Diagnosis not present

## 2021-09-24 NOTE — Progress Notes (Signed)
Subjective:  Patient ID: [name] [name], male    DOB: 1941/05/16  Age: 80 y.o. MRN: [id number]  CC: F2F  (Lift chair)   HPI Vasilios R Quesinberry presents for lift chair mechanism. Pt. Suffers from arthritis in the lower back, hips and knees. Additionally, he has lost a lot of strength due to heart and kidney disease.  Lost 40 lb recently. Now has so little strength in the legs that he cannot stand up from seated position without help. HE needs a lift chair to allow for toileting, grooming or eating. He is currently relying on his 95 year old wife for assistance. She is unable to continue due to her own age and health.   Depression screen [facility] 2/9 09/24/2021 07/30/2021 05/15/2021  Decreased Interest 0 0 0  Down, Depressed, Hopeless 0 0 0  PHQ - 2 Score 0 0 0  Altered sleeping - - -  Tired, decreased energy - - -  Change in appetite - - -  Feeling bad or failure about yourself  - - -  Trouble concentrating - - -  Moving slowly or fidgety/restless - - -  Suicidal thoughts - - -  PHQ-9 Score - - -  Some recent data might be hidden    History Leray has a past medical history of AICD (automatic cardioverter/defibrillator) present (04/05/2017), Anemia, Arthritis, Asthma, Atrial fibrillation ([place]), CAD (coronary artery disease), CHF (congestive heart failure) ([place]), Cholecystitis (11/2013), CKD (chronic kidney disease) (2015), COPD (chronic obstructive pulmonary disease) ([place]), Esophageal reflux, Gallstones, Gastric antral vascular ectasia (2013), Gout, Heme positive stool, Hepatomegaly, Hiatal hernia, History of blood transfusion (~ 2012), Hyperlipidemia, Hypothyroidism, Other and unspecified coagulation defects, Personal history of colonic polyps (05/29/2010), and Unspecified essential hypertension.   He has a past surgical history that includes Coronary angioplasty with stent (~ 2008; 07/08/2014); Coronary artery bypass graft (10/04/1996); Hernia repair; Umbilical hernia repair (06/04/1969); left  and right heart catheterization with coronary/graft angiogram (N/A, 07/09/2014); Colonoscopy (10/05/2011); Esophagogastroduodenoscopy (10/05/2011); Givens capsule study (10/05/2011); cholecystomy tube (12/28/2014 - 01/06/2015); Esophagogastroduodenoscopy (N/A, 12/12/2015); Cardiac catheterization (N/A, 07/15/2016); ICD IMPLANT (N/A, 04/05/2017); IR US Guide Vasc Access Right (05/27/2021); IR Fluoro Guide CV Line Right (05/27/2021); IR Fluoro Guide CV Line Right (06/02/2021); and Renal biopsy (Right).   His family history includes Early death in his brother; Heart attack in his brother; Heart disease in his brother and father; Hypertension in an other family member; Kidney disease in his mother; Leukemia in his mother; Stomach cancer in his sister and sister.He reports that he quit smoking about 24 years ago. His smoking use included cigarettes. He has a 42.00 pack-year smoking history. He has never used smokeless tobacco. He reports current alcohol use of about 1.0 standard drink per week. He reports that he does not use drugs.    ROS Review of Systems  Constitutional:  Negative for fever.  Respiratory:  Negative for shortness of breath.   Cardiovascular:  Negative for chest pain.  Musculoskeletal:  Positive for arthralgias and gait problem.  Skin:  Negative for rash.   Objective:  BP 139/60    Pulse 87    Temp (!) 97.4 F (36.3 C)    SpO2 92%   BP Readings from Last 3 Encounters:  09/24/21 139/60  09/23/21 138/84  09/22/21 (!) 127/52    Wt Readings from Last 3 Encounters:  09/23/21 152 lb 3.2 oz (69 kg)  09/22/21 150 lb (68 kg)  09/08/21 152 lb (68.9 kg)     Physical Exam Vitals  reviewed.  Constitutional:      Appearance: He is well-developed.  HENT:     Head: Normocephalic and atraumatic.     Right Ear: External ear normal.     Left Ear: External ear normal.     Mouth/Throat:     Pharynx: No oropharyngeal exudate or posterior oropharyngeal erythema.  Eyes:     Pupils: Pupils  are equal, round, and reactive to light.  Cardiovascular:     Rate and Rhythm: Normal rate and regular rhythm.     Heart sounds: No murmur heard. Pulmonary:     Effort: No respiratory distress.     Breath sounds: Normal breath sounds.  Musculoskeletal:     Cervical back: Normal range of motion and neck supple.  Neurological:     Mental Status: He is alert and oriented to person, place, and time.      Assessment & Plan:   Asuncion was seen today for f13f .  Diagnoses and all orders for this visit:  Arthritis of both hips -     DG HIP UNILAT W OR W/O PELVIS 2-3 VIEWS RIGHT; Future -     DG HIP UNILAT W OR W/O PELVIS 2-3 VIEWS LEFT; Future  Arthritis of both knees -     DG Knee 1-2 Views Right; Future -     DG Knee 1-2 Views Left; Future       I am having Cameo R. Geigle "[name]" maintain his loratadine, nitroGLYCERIN, Vitamin D3, acetaminophen, OXYGEN, budesonide, albuterol, ipratropium-albuterol, HYDROcodone-acetaminophen, amLODipine, furosemide, levothyroxine, b complex-vitamin c-folic acid, and pravastatin.  Allergies as of 09/24/2021   No Known Allergies      Medication List        Accurate as of September 24, 2021  6:04 PM. If you have any questions, ask your nurse or doctor.          acetaminophen 325 MG tablet Commonly known as: TYLENOL Take 2 tablets (650 mg total) by mouth every 6 (six) hours as needed for mild pain, fever or headache.   albuterol 108 (90 Base) MCG/ACT inhaler Commonly known as: Proventil HFA Inhale 2 puffs into the lungs every 4 (four) hours as needed for wheezing or shortness of breath.   amLODipine 10 MG tablet Commonly known as: NORVASC Take 1 tablet (10 mg total) by mouth daily.   b complex-vitamin c-folic acid 0.8 MG Tabs tablet TAKE 1 TABLET BY MOUTH EVERYDAY AT BEDTIME   budesonide 0.25 MG/2ML nebulizer solution Commonly known as: PULMICORT USE 1 VIAL  IN  NEBULIZER TWICE  DAILY - rinse mouth after treatment    furosemide 40 MG tablet Commonly known as: LASIX Take 1 tablet (40 mg total) by mouth daily.   HYDROcodone-acetaminophen 5-325 MG tablet Commonly known as: NORCO/VICODIN Take 1-2 tablets by mouth every 6 (six) hours as needed.   ipratropium-albuterol 0.5-2.5 (3) MG/3ML Soln Commonly known as: DUONEB USE 1 VIAL IN NEBULIZER 4 TIMES DAILY   levothyroxine 112 MCG tablet Commonly known as: SYNTHROID Take 1 tablet (112 mcg total) by mouth daily before breakfast.   loratadine 10 MG tablet Commonly known as: CLARITIN Take 10 mg by mouth daily as needed (for allergies.).   nitroGLYCERIN 0.4 MG SL tablet Commonly known as: NITROSTAT Place 1 tablet (0.4 mg total) under the tongue every 5 (five) minutes as needed for chest pain.   OXYGEN Inhale into the lungs at bedtime.   pravastatin 40 MG tablet Commonly known as: PRAVACHOL TAKE 1 TABLET BY MOUTH DAILY AT  6 PM.   Vitamin D3 125 MCG (5000 UT) Caps Take 1 capsule (5,000 Units total) by mouth daily.         Follow-up: Return in about 6 weeks (around 11/05/2021).  Claretta Fraise, M.D.

## 2021-09-25 ENCOUNTER — Encounter (HOSPITAL_COMMUNITY)
Admission: RE | Admit: 2021-09-25 | Discharge: 2021-09-25 | Disposition: A | Discharge status: Home / Self Care | Payer: Medicare Other | Source: Ambulatory Visit | Attending: Vascular Surgery | Admitting: Vascular Surgery

## 2021-09-25 DIAGNOSIS — N2581 Secondary hyperparathyroidism of renal origin: Secondary | ICD-10-CM | POA: Diagnosis not present

## 2021-09-25 DIAGNOSIS — D689 Coagulation defect, unspecified: Secondary | ICD-10-CM | POA: Diagnosis not present

## 2021-09-25 DIAGNOSIS — D509 Iron deficiency anemia, unspecified: Secondary | ICD-10-CM | POA: Diagnosis not present

## 2021-09-25 DIAGNOSIS — N186 End stage renal disease: Secondary | ICD-10-CM | POA: Diagnosis not present

## 2021-09-25 DIAGNOSIS — R52 Pain, unspecified: Secondary | ICD-10-CM | POA: Diagnosis not present

## 2021-09-25 DIAGNOSIS — Z992 Dependence on renal dialysis: Secondary | ICD-10-CM | POA: Diagnosis not present

## 2021-09-25 DIAGNOSIS — D649 Anemia, unspecified: Secondary | ICD-10-CM | POA: Diagnosis not present

## 2021-09-25 LAB — SURGICAL PATHOLOGY

## 2021-09-27 DIAGNOSIS — J449 Chronic obstructive pulmonary disease, unspecified: Secondary | ICD-10-CM | POA: Diagnosis not present

## 2021-09-28 DIAGNOSIS — D689 Coagulation defect, unspecified: Secondary | ICD-10-CM | POA: Diagnosis not present

## 2021-09-28 DIAGNOSIS — Z992 Dependence on renal dialysis: Secondary | ICD-10-CM | POA: Diagnosis not present

## 2021-09-28 DIAGNOSIS — R52 Pain, unspecified: Secondary | ICD-10-CM | POA: Diagnosis not present

## 2021-09-28 DIAGNOSIS — D649 Anemia, unspecified: Secondary | ICD-10-CM | POA: Diagnosis not present

## 2021-09-28 DIAGNOSIS — D509 Iron deficiency anemia, unspecified: Secondary | ICD-10-CM | POA: Diagnosis not present

## 2021-09-28 DIAGNOSIS — N2581 Secondary hyperparathyroidism of renal origin: Secondary | ICD-10-CM | POA: Diagnosis not present

## 2021-09-28 DIAGNOSIS — N186 End stage renal disease: Secondary | ICD-10-CM | POA: Diagnosis not present

## 2021-09-29 ENCOUNTER — Ambulatory Visit (HOSPITAL_COMMUNITY)
Admission: RE | Admit: 2021-09-29 | Discharge: 2021-09-29 | Disposition: A | Discharge status: Home / Self Care | Payer: Medicare Other | Attending: Vascular Surgery | Admitting: Vascular Surgery

## 2021-09-29 ENCOUNTER — Ambulatory Visit (HOSPITAL_COMMUNITY): Payer: Medicare Other | Admitting: Anesthesiology

## 2021-09-29 ENCOUNTER — Encounter (HOSPITAL_COMMUNITY)
Admission: RE | Disposition: A | Discharge status: Home / Self Care | Payer: Self-pay | Source: Home / Self Care | Attending: Vascular Surgery

## 2021-09-29 ENCOUNTER — Encounter (HOSPITAL_COMMUNITY): Payer: Self-pay | Admitting: Vascular Surgery

## 2021-09-29 ENCOUNTER — Other Ambulatory Visit: Payer: Self-pay

## 2021-09-29 DIAGNOSIS — N186 End stage renal disease: Secondary | ICD-10-CM | POA: Diagnosis not present

## 2021-09-29 DIAGNOSIS — I5022 Chronic systolic (congestive) heart failure: Secondary | ICD-10-CM | POA: Diagnosis not present

## 2021-09-29 DIAGNOSIS — Z95810 Presence of automatic (implantable) cardiac defibrillator: Secondary | ICD-10-CM | POA: Diagnosis not present

## 2021-09-29 DIAGNOSIS — I120 Hypertensive chronic kidney disease with stage 5 chronic kidney disease or end stage renal disease: Secondary | ICD-10-CM | POA: Diagnosis not present

## 2021-09-29 DIAGNOSIS — Z992 Dependence on renal dialysis: Secondary | ICD-10-CM | POA: Insufficient documentation

## 2021-09-29 DIAGNOSIS — I6523 Occlusion and stenosis of bilateral carotid arteries: Secondary | ICD-10-CM | POA: Diagnosis not present

## 2021-09-29 DIAGNOSIS — I132 Hypertensive heart and chronic kidney disease with heart failure and with stage 5 chronic kidney disease, or end stage renal disease: Secondary | ICD-10-CM | POA: Diagnosis not present

## 2021-09-29 DIAGNOSIS — I4891 Unspecified atrial fibrillation: Secondary | ICD-10-CM | POA: Diagnosis not present

## 2021-09-29 HISTORY — PX: AV FISTULA PLACEMENT: SHX1204

## 2021-09-29 LAB — POCT I-STAT, CHEM 8
BUN: 20 mg/dL (ref 8–23)
Calcium, Ion: 1.1 mmol/L — ABNORMAL LOW (ref 1.15–1.40)
Chloride: 98 mmol/L (ref 98–111)
Creatinine, Ser: 2.8 mg/dL — ABNORMAL HIGH (ref 0.61–1.24)
Glucose, Bld: 89 mg/dL (ref 70–99)
HCT: 36 % — ABNORMAL LOW (ref 39.0–52.0)
Hemoglobin: 12.2 g/dL — ABNORMAL LOW (ref 13.0–17.0)
Potassium: 3.9 mmol/L (ref 3.5–5.1)
Sodium: 134 mmol/L — ABNORMAL LOW (ref 135–145)
TCO2: 26 mmol/L (ref 22–32)

## 2021-09-29 SURGERY — ARTERIOVENOUS (AV) FISTULA CREATION
Anesthesia: General | Site: Arm Upper | Laterality: Right

## 2021-09-29 MED ORDER — CEFAZOLIN SODIUM-DEXTROSE 2-4 GM/100ML-% IV SOLN
2.0000 g | INTRAVENOUS | Status: AC
  Administered 2021-09-29: 10:00:00 2 g via INTRAVENOUS
  Filled 2021-09-29: qty 100

## 2021-09-29 MED ORDER — LIDOCAINE-EPINEPHRINE (PF) 1 %-1:200000 IJ SOLN
INTRAMUSCULAR | Status: AC
  Filled 2021-09-29: qty 30

## 2021-09-29 MED ORDER — 0.9 % SODIUM CHLORIDE (POUR BTL) OPTIME
TOPICAL | Status: DC | PRN
  Administered 2021-09-29: 10:00:00 1000 mL

## 2021-09-29 MED ORDER — PROPOFOL 500 MG/50ML IV EMUL
INTRAVENOUS | Status: DC | PRN
  Administered 2021-09-29: 50 ug/kg/min via INTRAVENOUS

## 2021-09-29 MED ORDER — LIDOCAINE HCL (CARDIAC) PF 100 MG/5ML IV SOSY
PREFILLED_SYRINGE | INTRAVENOUS | Status: DC | PRN
  Administered 2021-09-29: 40 mg via INTRAVENOUS

## 2021-09-29 MED ORDER — SODIUM CHLORIDE 0.9 % IV SOLN: INTRAVENOUS | Status: DC

## 2021-09-29 MED ORDER — CHLORHEXIDINE GLUCONATE 4 % EX LIQD: 60.0000 mL | Freq: Once | CUTANEOUS | Status: DC

## 2021-09-29 MED ORDER — LIDOCAINE-EPINEPHRINE (PF) 1 %-1:200000 IJ SOLN
INTRAMUSCULAR | Status: DC | PRN
  Administered 2021-09-29: 8 mL

## 2021-09-29 MED ORDER — OXYCODONE-ACETAMINOPHEN 5-325 MG PO TABS: 1.0000 | ORAL_TABLET | Freq: Four times a day (QID) | ORAL | 0 refills | Status: DC | PRN

## 2021-09-29 MED ORDER — PROPOFOL 10 MG/ML IV BOLUS
INTRAVENOUS | Status: AC
  Filled 2021-09-29: qty 20

## 2021-09-29 MED ORDER — HEPARIN 6000 UNIT IRRIGATION SOLUTION
Status: DC | PRN
  Administered 2021-09-29: 1

## 2021-09-29 MED ORDER — HEPARIN SODIUM (PORCINE) 1000 UNIT/ML IJ SOLN
INTRAMUSCULAR | Status: AC
  Filled 2021-09-29: qty 6

## 2021-09-29 SURGICAL SUPPLY — 38 items
ADH SKN CLS APL DERMABOND .7 (GAUZE/BANDAGES/DRESSINGS) ×1
ARMBAND PINK RESTRICT EXTREMIT (MISCELLANEOUS) ×3 IMPLANT
BAG HAMPER (MISCELLANEOUS) ×3 IMPLANT
CANNULA VESSEL 3MM 2 BLNT TIP (CANNULA) ×3 IMPLANT
CLIP LIGATING EXTRA MED SLVR (CLIP) ×3 IMPLANT
CLIP LIGATING EXTRA SM BLUE (MISCELLANEOUS) ×3 IMPLANT
COVER LIGHT HANDLE STERIS (MISCELLANEOUS) ×6 IMPLANT
COVER MAYO STAND XLG (MISCELLANEOUS) ×3 IMPLANT
DERMABOND ADVANCED (GAUZE/BANDAGES/DRESSINGS) ×2
DERMABOND ADVANCED .7 DNX12 (GAUZE/BANDAGES/DRESSINGS) ×1 IMPLANT
ELECT REM PT RETURN 9FT ADLT (ELECTROSURGICAL) ×3
ELECTRODE REM PT RTRN 9FT ADLT (ELECTROSURGICAL) ×1 IMPLANT
GAUZE SPONGE 4X4 12PLY STRL (GAUZE/BANDAGES/DRESSINGS) ×3 IMPLANT
GLOVE SURG MICRO LTX SZ7.5 (GLOVE) ×3 IMPLANT
GLOVE SURG UNDER POLY LF SZ7 (GLOVE) ×9 IMPLANT
GOWN STRL REUS W/TWL LRG LVL3 (GOWN DISPOSABLE) ×9 IMPLANT
IV NS 500ML (IV SOLUTION) ×3
IV NS 500ML BAXH (IV SOLUTION) ×1 IMPLANT
KIT BLADEGUARD II DBL (SET/KITS/TRAYS/PACK) ×3 IMPLANT
KIT TURNOVER KIT A (KITS) ×3 IMPLANT
MANIFOLD NEPTUNE II (INSTRUMENTS) ×3 IMPLANT
MARKER SKIN DUAL TIP RULER LAB (MISCELLANEOUS) ×6 IMPLANT
NDL HYPO 18GX1.5 BLUNT FILL (NEEDLE) ×1 IMPLANT
NEEDLE HYPO 18GX1.5 BLUNT FILL (NEEDLE) ×3 IMPLANT
NS IRRIG 1000ML POUR BTL (IV SOLUTION) ×3 IMPLANT
PACK CV ACCESS (CUSTOM PROCEDURE TRAY) ×3 IMPLANT
PAD ARMBOARD 7.5X6 YLW CONV (MISCELLANEOUS) ×3 IMPLANT
SET BASIN LINEN APH (SET/KITS/TRAYS/PACK) ×3 IMPLANT
SOL PREP POV-IOD 4OZ 10% (MISCELLANEOUS) ×3 IMPLANT
SOL PREP PROV IODINE SCRUB 4OZ (MISCELLANEOUS) ×3 IMPLANT
SPONGE T-LAP 18X18 ~~LOC~~+RFID (SPONGE) ×3 IMPLANT
SUT PROLENE 6 0 CC (SUTURE) ×5 IMPLANT
SUT SILK 2 0 SH (SUTURE) ×2 IMPLANT
SUT VIC AB 3-0 SH 27 (SUTURE) ×3
SUT VIC AB 3-0 SH 27X BRD (SUTURE) ×1 IMPLANT
SYR 10ML LL (SYRINGE) ×3 IMPLANT
SYR 50ML LL SCALE MARK (SYRINGE) ×3 IMPLANT
UNDERPAD 30X36 HEAVY ABSORB (UNDERPADS AND DIAPERS) ×3 IMPLANT

## 2021-09-29 NOTE — Op Note (Signed)
° ° °  OPERATIVE REPORT  DATE OF SURGERY: 09/29/2021  PATIENT: [name] [name], 80 y.o. male MRN: [id number]  DOB: Jul 11, 1941  PRE-OPERATIVE DIAGNOSIS: End-stage renal disease  POST-OPERATIVE DIAGNOSIS:  Same  PROCEDURE: Right brachiocephalic AV fistula creation  SURGEON:  Curt Jews, M.D.  PHYSICIAN ASSISTANT: Fulton Mole, RNFA  The assistant was needed for exposure and to expedite the case  ANESTHESIA: Local with sedation  EBL: per anesthesia record  Total I/O In: 350 [I.V.:250; IV Piggyback:100] Out: 10 [Blood:10]  BLOOD ADMINISTERED: none  DRAINS: none  SPECIMEN: none  COUNTS CORRECT:  YES  PATIENT DISPOSITION:  PACU - hemodynamically stable  PROCEDURE DETAILS: Patient was taken operating placed supine position where the area of the right arm prepped draped you sterile fashion.  Local anesthesia was used at the antecubital space an incision was made transversely.  The cephalic vein was mobilized.  There was some scarring from old IVs but good caliber cephalic vein.  Tributary branches were ligated distally and divided.  The vein was gently dilated and was felt to be of good caliber for fistula creation.  The brachial artery was exposed through the same incision.  The artery was good caliber.  There was extensive calcific atherosclerotic disease but no flow limitation through the artery.  The vein was brought into approximation with the artery.  The brachial artery was occluded proximally and distally and was opened with an 11 blade and sent longitudinally with Potts scissors.  The vein was spatulated and sewn end-to-side to the artery with a running 6-0 Prolene suture.  The anastomosis was tested and found to be adequate.  There was excellent flow through the fistula.  The patient had a very calcified radial artery by physical exam and did not have a pulse preoperatively.  There was a large branch just above the antecubital space off the cephalic vein and this was clipped.   The wound was irrigated with saline.  Hemostasis was obtained with electrocautery.  The wound was closed with 3-0 Vicryl in the subcutaneous and subcuticular tissue.  Sterile dressing was applied and the patient was transferred to the recovery room in stable condition   Rosetta Posner, M.D., [facility] 09/29/2021 11:01 AM  Note: Portions of this report may have been transcribed using voice recognition software.  Every effort has been made to ensure accuracy; however, inadvertent computerized transcription errors may still be present.

## 2021-09-29 NOTE — Anesthesia Postprocedure Evaluation (Signed)
Anesthesia Post Note  Patient: [name] [name]  Procedure(s) Performed: RIGHT ARM ARTERIOVENOUS (AV) FISTULA CREATION (Right: Arm Upper)  Patient location during evaluation: Phase II Anesthesia Type: General Level of consciousness: awake Pain management: pain level controlled Vital Signs Assessment: post-procedure vital signs reviewed and stable Respiratory status: spontaneous breathing and respiratory function stable Cardiovascular status: blood pressure returned to baseline and stable Postop Assessment: no headache and no apparent nausea or vomiting Anesthetic complications: no Comments: Late entry   No notable events documented.   Last Vitals:  Vitals:   09/29/21 1115 09/29/21 1131  BP: (!) 150/66 (!) 147/56  Pulse: 65 70  Resp: 17 16  Temp:  36.7 C  SpO2: 100% 96%    Last Pain:  Vitals:   09/29/21 1131  TempSrc: Oral  PainSc: 0-No pain                 Louann Sjogren

## 2021-09-29 NOTE — Transfer of Care (Signed)
Immediate Anesthesia Transfer of Care Note  Patient: [name] [name]  Procedure(s) Performed: RIGHT ARM ARTERIOVENOUS (AV) FISTULA CREATION (Right: Arm Upper)  Patient Location: PACU  Anesthesia Type:MAC  Level of Consciousness: awake, alert , oriented and patient cooperative  Airway & Oxygen Therapy: Patient Spontanous Breathing and Patient connected to nasal cannula oxygen  Post-op Assessment: Report given to RN, Post -op Vital signs reviewed and stable and Patient moving all extremities  Post vital signs: Reviewed and stable  Last Vitals:  Vitals Value Taken Time  BP 140/55 09/29/21 1104  Temp    Pulse 64 09/29/21 1104  Resp    SpO2 100 % 09/29/21 1104  Vitals shown include unvalidated device data.  Last Pain:  Vitals:   09/29/21 0837  TempSrc: Oral  PainSc: 0-No pain         Complications: No notable events documented.

## 2021-09-29 NOTE — Anesthesia Preprocedure Evaluation (Signed)
Anesthesia Evaluation  Patient identified by MRN, date of birth, ID band Patient awake    Reviewed: Allergy & Precautions, H&P , NPO status , Patient's Chart, lab work & pertinent test results, reviewed documented beta blocker date and time   Airway Mallampati: II  TM Distance: >3 FB Neck ROM: full    Dental no notable dental hx.    Pulmonary shortness of breath, asthma , COPD, former smoker,    Pulmonary exam normal breath sounds clear to auscultation       Cardiovascular Exercise Tolerance: Good hypertension, + angina + CAD and +CHF  + Cardiac Defibrillator  Rhythm:regular Rate:Normal     Neuro/Psych negative neurological ROS  negative psych ROS   GI/Hepatic Neg liver ROS, hiatal hernia, GERD  Medicated,  Endo/Other  Hypothyroidism   Renal/GU ESRF and CRFRenal disease  negative genitourinary   Musculoskeletal   Abdominal   Peds  Hematology  (+) Blood dyscrasia, anemia ,   Anesthesia Other Findings 1. Left ventricular ejection fraction, by estimation, is 50 to 55%. The  left ventricle has low normal function. The left ventricle has no regional  wall motion abnormalities. There is moderate left ventricular hypertrophy.  Left ventricular diastolic  parameters are indeterminate.  2. Right ventricular systolic function is mildly reduced. The right  ventricular size is mildly enlarged. There is mildly elevated pulmonary  artery systolic pressure.  3. Left atrial size was severely dilated.  4. The mitral valve is normal in structure. Mild mitral valve  regurgitation. No evidence of mitral stenosis.  5. The aortic valve is tricuspid. Aortic valve regurgitation is not  visualized. Mild to moderate aortic valve sclerosis/calcification is  present, without any evidence of aortic stenosis.   Reproductive/Obstetrics negative OB ROS                             Anesthesia Physical Anesthesia  Plan  ASA: 3  Anesthesia Plan: General   Post-op Pain Management:    Induction:   PONV Risk Score and Plan: Propofol infusion  Airway Management Planned:   Additional Equipment:   Intra-op Plan:   Post-operative Plan:   Informed Consent: I have reviewed the patients History and Physical, chart, labs and discussed the procedure including the risks, benefits and alternatives for the proposed anesthesia with the patient or authorized representative who has indicated his/her understanding and acceptance.     Dental Advisory Given  Plan Discussed with: CRNA  Anesthesia Plan Comments:         Anesthesia Quick Evaluation

## 2021-09-29 NOTE — Discharge Instructions (Signed)
[facility]  Discharge Instructions  AV Fistula or Graft Surgery for Dialysis Access  Please refer to the following instructions for your post-procedure care. Your surgeon or physician assistant will discuss any changes with you.  Activity  You may drive the day following your surgery, if you are comfortable and no longer taking prescription pain medication. Resume full activity as the soreness in your incision resolves.  Bathing/Showering  You may shower after you go home. Keep your incision dry for 48 hours. Do not soak in a bathtub, hot tub, or swim until the incision heals completely. You may not shower if you have a hemodialysis catheter.  Incision Care  Clean your incision with mild soap and water after 48 hours. Pat the area dry with a clean towel. You do not need a bandage unless otherwise instructed. Do not apply any ointments or creams to your incision. You may have skin glue on your incision. Do not peel it off. It will come off on its own in about one week. Your arm may swell a bit after surgery. To reduce swelling use pillows to elevate your arm so it is above your heart. Your doctor will tell you if you need to lightly wrap your arm with an ACE bandage.  Diet  Resume your normal diet. There are not special food restrictions following this procedure. In order to heal from your surgery, it is CRITICAL to get adequate nutrition. Your body requires vitamins, minerals, and protein. Vegetables are the best source of vitamins and minerals. Vegetables also provide the perfect balance of protein. Processed food has little nutritional value, so try to avoid this.  Medications  Resume taking all of your medications. If your incision is causing pain, you may take over-the counter pain relievers such as acetaminophen (Tylenol). If you were prescribed a stronger pain medication, please be aware these medications can cause nausea and constipation. Prevent  nausea by taking the medication with a snack or meal. Avoid constipation by drinking plenty of fluids and eating foods with high amount of fiber, such as fruits, vegetables, and grains.  Do not take Tylenol if you are taking prescription pain medications.  Follow up Your surgeon may want to see you in the office following your access surgery. If so, this will be arranged at the time of your surgery.  Please call us immediately for any of the following conditions:  Increased pain, redness, drainage (pus) from your incision site Fever of 101 degrees or higher Severe or worsening pain at your incision site Hand pain or numbness.  Reduce your risk of vascular disease:  Stop smoking. If you would like help, call [facility] at 1-800-QUIT-NOW 216-485-8616) or [place] at [place] your cholesterol Maintain a desired weight Control your diabetes Keep your blood pressure down  Dialysis  It will take several weeks to several months for your new dialysis access to be ready for use. Your surgeon will determine when it is okay to use it. Your nephrologist will continue to direct your dialysis. You can continue to use your Permcath until your new access is ready for use.   09/29/2021 [name] [name] [id number] July 25, 1941  Surgeon(s): Dulcemaria Bula, Arvilla Meres, MD  Procedure(s): RIGHT ARM ARTERIOVENOUS (AV) FISTULA CREATION   May stick graft immediately   May stick graft on designated area only:    Do not stick fistula for 12 weeks    If you have any questions, please call the office at [phone].

## 2021-09-29 NOTE — Interval H&P Note (Signed)
History and Physical Interval Note:  09/29/2021 8:32 AM  [name] [name]  has presented today for surgery, with the diagnosis of ESRD.  The various methods of treatment have been discussed with the patient and family. After consideration of risks, benefits and other options for treatment, the patient has consented to  Procedure(s): RIGHT ARM ARTERIOVENOUS (AV) FISTULA VERSUS GRAFT CREATION (Right) as a surgical intervention.  The patient's history has been reviewed, patient examined, no change in status, stable for surgery.  I have reviewed the patient's chart and labs.  Questions were answered to the patient's satisfaction.     Curt Jews

## 2021-09-30 ENCOUNTER — Encounter (HOSPITAL_COMMUNITY): Payer: Self-pay | Admitting: Vascular Surgery

## 2021-09-30 DIAGNOSIS — D509 Iron deficiency anemia, unspecified: Secondary | ICD-10-CM | POA: Diagnosis not present

## 2021-09-30 DIAGNOSIS — Z992 Dependence on renal dialysis: Secondary | ICD-10-CM | POA: Diagnosis not present

## 2021-09-30 DIAGNOSIS — D649 Anemia, unspecified: Secondary | ICD-10-CM | POA: Diagnosis not present

## 2021-09-30 DIAGNOSIS — N2581 Secondary hyperparathyroidism of renal origin: Secondary | ICD-10-CM | POA: Diagnosis not present

## 2021-09-30 DIAGNOSIS — D689 Coagulation defect, unspecified: Secondary | ICD-10-CM | POA: Diagnosis not present

## 2021-09-30 DIAGNOSIS — R52 Pain, unspecified: Secondary | ICD-10-CM | POA: Diagnosis not present

## 2021-09-30 DIAGNOSIS — N186 End stage renal disease: Secondary | ICD-10-CM | POA: Diagnosis not present

## 2021-10-01 ENCOUNTER — Ambulatory Visit (INDEPENDENT_AMBULATORY_CARE_PROVIDER_SITE_OTHER): Payer: Medicare Other

## 2021-10-01 ENCOUNTER — Ambulatory Visit: Payer: Medicare Other | Admitting: Family Medicine

## 2021-10-01 DIAGNOSIS — I482 Chronic atrial fibrillation, unspecified: Secondary | ICD-10-CM | POA: Diagnosis not present

## 2021-10-01 LAB — CUP PACEART REMOTE DEVICE CHECK
Battery Remaining Longevity: 96 mo
Battery Remaining Percentage: 86 %
Brady Statistic RA Percent Paced: 30 %
Brady Statistic RV Percent Paced: 3 %
Date Time Interrogation Session: 20221229010100
HighPow Impedance: 66 Ohm
Implantable Lead Implant Date: 20180703
Implantable Lead Implant Date: 20180703
Implantable Lead Location: 753859
Implantable Lead Location: 753860
Implantable Lead Model: 293
Implantable Lead Model: 3830
Implantable Lead Serial Number: 433180
Implantable Pulse Generator Implant Date: 20180703
Lead Channel Impedance Value: 448 Ohm
Lead Channel Impedance Value: 654 Ohm
Lead Channel Setting Pacing Amplitude: 2.5 V
Lead Channel Setting Pacing Amplitude: 2.5 V
Lead Channel Setting Pacing Pulse Width: 0.4 ms
Lead Channel Setting Sensing Sensitivity: 0.5 mV
Pulse Gen Serial Number: 533886

## 2021-10-03 DIAGNOSIS — Z992 Dependence on renal dialysis: Secondary | ICD-10-CM | POA: Diagnosis not present

## 2021-10-03 DIAGNOSIS — N179 Acute kidney failure, unspecified: Secondary | ICD-10-CM | POA: Diagnosis not present

## 2021-10-03 DIAGNOSIS — R52 Pain, unspecified: Secondary | ICD-10-CM | POA: Diagnosis not present

## 2021-10-03 DIAGNOSIS — D649 Anemia, unspecified: Secondary | ICD-10-CM | POA: Diagnosis not present

## 2021-10-03 DIAGNOSIS — N2581 Secondary hyperparathyroidism of renal origin: Secondary | ICD-10-CM | POA: Diagnosis not present

## 2021-10-03 DIAGNOSIS — D509 Iron deficiency anemia, unspecified: Secondary | ICD-10-CM | POA: Diagnosis not present

## 2021-10-03 DIAGNOSIS — N186 End stage renal disease: Secondary | ICD-10-CM | POA: Diagnosis not present

## 2021-10-03 DIAGNOSIS — D689 Coagulation defect, unspecified: Secondary | ICD-10-CM | POA: Diagnosis not present

## 2021-10-05 DIAGNOSIS — D649 Anemia, unspecified: Secondary | ICD-10-CM | POA: Diagnosis not present

## 2021-10-05 DIAGNOSIS — N2581 Secondary hyperparathyroidism of renal origin: Secondary | ICD-10-CM | POA: Diagnosis not present

## 2021-10-05 DIAGNOSIS — Z992 Dependence on renal dialysis: Secondary | ICD-10-CM | POA: Diagnosis not present

## 2021-10-05 DIAGNOSIS — Z23 Encounter for immunization: Secondary | ICD-10-CM | POA: Diagnosis not present

## 2021-10-05 DIAGNOSIS — D509 Iron deficiency anemia, unspecified: Secondary | ICD-10-CM | POA: Diagnosis not present

## 2021-10-05 DIAGNOSIS — D689 Coagulation defect, unspecified: Secondary | ICD-10-CM | POA: Diagnosis not present

## 2021-10-05 DIAGNOSIS — N186 End stage renal disease: Secondary | ICD-10-CM | POA: Diagnosis not present

## 2021-10-05 DIAGNOSIS — R52 Pain, unspecified: Secondary | ICD-10-CM | POA: Diagnosis not present

## 2021-10-06 ENCOUNTER — Telehealth: Payer: Self-pay | Admitting: Oncology

## 2021-10-06 NOTE — Telephone Encounter (Signed)
Scheduled appt per 1/3 referral. Pt's wife is aware of appt date and time. She is aware to arrive 15 mins prior to appt time.

## 2021-10-07 DIAGNOSIS — Z23 Encounter for immunization: Secondary | ICD-10-CM | POA: Diagnosis not present

## 2021-10-07 DIAGNOSIS — N2581 Secondary hyperparathyroidism of renal origin: Secondary | ICD-10-CM | POA: Diagnosis not present

## 2021-10-07 DIAGNOSIS — R52 Pain, unspecified: Secondary | ICD-10-CM | POA: Diagnosis not present

## 2021-10-07 DIAGNOSIS — D509 Iron deficiency anemia, unspecified: Secondary | ICD-10-CM | POA: Diagnosis not present

## 2021-10-07 DIAGNOSIS — D649 Anemia, unspecified: Secondary | ICD-10-CM | POA: Diagnosis not present

## 2021-10-07 DIAGNOSIS — N186 End stage renal disease: Secondary | ICD-10-CM | POA: Diagnosis not present

## 2021-10-07 DIAGNOSIS — D689 Coagulation defect, unspecified: Secondary | ICD-10-CM | POA: Diagnosis not present

## 2021-10-07 DIAGNOSIS — Z992 Dependence on renal dialysis: Secondary | ICD-10-CM | POA: Diagnosis not present

## 2021-10-09 DIAGNOSIS — D689 Coagulation defect, unspecified: Secondary | ICD-10-CM | POA: Diagnosis not present

## 2021-10-09 DIAGNOSIS — D649 Anemia, unspecified: Secondary | ICD-10-CM | POA: Diagnosis not present

## 2021-10-09 DIAGNOSIS — D509 Iron deficiency anemia, unspecified: Secondary | ICD-10-CM | POA: Diagnosis not present

## 2021-10-09 DIAGNOSIS — Z23 Encounter for immunization: Secondary | ICD-10-CM | POA: Diagnosis not present

## 2021-10-09 DIAGNOSIS — N2581 Secondary hyperparathyroidism of renal origin: Secondary | ICD-10-CM | POA: Diagnosis not present

## 2021-10-09 DIAGNOSIS — N186 End stage renal disease: Secondary | ICD-10-CM | POA: Diagnosis not present

## 2021-10-09 DIAGNOSIS — R52 Pain, unspecified: Secondary | ICD-10-CM | POA: Diagnosis not present

## 2021-10-09 DIAGNOSIS — Z992 Dependence on renal dialysis: Secondary | ICD-10-CM | POA: Diagnosis not present

## 2021-10-10 DIAGNOSIS — N2581 Secondary hyperparathyroidism of renal origin: Secondary | ICD-10-CM | POA: Diagnosis not present

## 2021-10-10 DIAGNOSIS — Z992 Dependence on renal dialysis: Secondary | ICD-10-CM | POA: Diagnosis not present

## 2021-10-10 DIAGNOSIS — D689 Coagulation defect, unspecified: Secondary | ICD-10-CM | POA: Diagnosis not present

## 2021-10-10 DIAGNOSIS — Z23 Encounter for immunization: Secondary | ICD-10-CM | POA: Diagnosis not present

## 2021-10-10 DIAGNOSIS — N186 End stage renal disease: Secondary | ICD-10-CM | POA: Diagnosis not present

## 2021-10-10 DIAGNOSIS — R52 Pain, unspecified: Secondary | ICD-10-CM | POA: Diagnosis not present

## 2021-10-10 DIAGNOSIS — D509 Iron deficiency anemia, unspecified: Secondary | ICD-10-CM | POA: Diagnosis not present

## 2021-10-10 DIAGNOSIS — D649 Anemia, unspecified: Secondary | ICD-10-CM | POA: Diagnosis not present

## 2021-10-12 ENCOUNTER — Telehealth: Payer: Self-pay | Admitting: Family Medicine

## 2021-10-12 ENCOUNTER — Other Ambulatory Visit: Payer: Self-pay | Admitting: Family Medicine

## 2021-10-12 DIAGNOSIS — Z23 Encounter for immunization: Secondary | ICD-10-CM | POA: Diagnosis not present

## 2021-10-12 DIAGNOSIS — Z992 Dependence on renal dialysis: Secondary | ICD-10-CM | POA: Diagnosis not present

## 2021-10-12 DIAGNOSIS — I25708 Atherosclerosis of coronary artery bypass graft(s), unspecified, with other forms of angina pectoris: Secondary | ICD-10-CM

## 2021-10-12 DIAGNOSIS — I4729 Other ventricular tachycardia: Secondary | ICD-10-CM

## 2021-10-12 DIAGNOSIS — N2581 Secondary hyperparathyroidism of renal origin: Secondary | ICD-10-CM | POA: Diagnosis not present

## 2021-10-12 DIAGNOSIS — D509 Iron deficiency anemia, unspecified: Secondary | ICD-10-CM | POA: Diagnosis not present

## 2021-10-12 DIAGNOSIS — R29898 Other symptoms and signs involving the musculoskeletal system: Secondary | ICD-10-CM

## 2021-10-12 DIAGNOSIS — D649 Anemia, unspecified: Secondary | ICD-10-CM | POA: Diagnosis not present

## 2021-10-12 DIAGNOSIS — N186 End stage renal disease: Secondary | ICD-10-CM | POA: Diagnosis not present

## 2021-10-12 DIAGNOSIS — R5381 Other malaise: Secondary | ICD-10-CM

## 2021-10-12 DIAGNOSIS — R52 Pain, unspecified: Secondary | ICD-10-CM | POA: Diagnosis not present

## 2021-10-12 DIAGNOSIS — D689 Coagulation defect, unspecified: Secondary | ICD-10-CM | POA: Diagnosis not present

## 2021-10-12 NOTE — Telephone Encounter (Signed)
Its for a lift chair.

## 2021-10-12 NOTE — Telephone Encounter (Signed)
°  Prescription Request  10/12/2021  Is this a "Controlled Substance" medicine? no  Have you seen your PCP in the last 2 weeks? 12/29  If YES, route message to pool  -  If NO, patient needs to be scheduled for appointment.  What is the name of the medication or equipment? Wife said he needs lift chair rx called [facility] [place] or she needs to pick up copy  Have you contacted your pharmacy to request a refill? yes   Which pharmacy would you like this sent to? Pt doesn't know name but it is [facility] in [place]   Patient notified that their request is being sent to the clinical staff for review and that they should receive a response within 2 business days.

## 2021-10-12 NOTE — Telephone Encounter (Signed)
Faxed to [facility]

## 2021-10-12 NOTE — Telephone Encounter (Signed)
PLease include the name and dose of the medication.

## 2021-10-12 NOTE — Telephone Encounter (Signed)
Order printed. Placed an JB's desk

## 2021-10-13 NOTE — Progress Notes (Signed)
Remote ICD transmission.   

## 2021-10-14 DIAGNOSIS — R52 Pain, unspecified: Secondary | ICD-10-CM | POA: Diagnosis not present

## 2021-10-14 DIAGNOSIS — Z992 Dependence on renal dialysis: Secondary | ICD-10-CM | POA: Diagnosis not present

## 2021-10-14 DIAGNOSIS — N2581 Secondary hyperparathyroidism of renal origin: Secondary | ICD-10-CM | POA: Diagnosis not present

## 2021-10-14 DIAGNOSIS — D689 Coagulation defect, unspecified: Secondary | ICD-10-CM | POA: Diagnosis not present

## 2021-10-14 DIAGNOSIS — D509 Iron deficiency anemia, unspecified: Secondary | ICD-10-CM | POA: Diagnosis not present

## 2021-10-14 DIAGNOSIS — N186 End stage renal disease: Secondary | ICD-10-CM | POA: Diagnosis not present

## 2021-10-14 DIAGNOSIS — Z23 Encounter for immunization: Secondary | ICD-10-CM | POA: Diagnosis not present

## 2021-10-14 DIAGNOSIS — D649 Anemia, unspecified: Secondary | ICD-10-CM | POA: Diagnosis not present

## 2021-10-15 DIAGNOSIS — N186 End stage renal disease: Secondary | ICD-10-CM | POA: Diagnosis not present

## 2021-10-15 DIAGNOSIS — I5022 Chronic systolic (congestive) heart failure: Secondary | ICD-10-CM | POA: Diagnosis not present

## 2021-10-15 DIAGNOSIS — J449 Chronic obstructive pulmonary disease, unspecified: Secondary | ICD-10-CM | POA: Diagnosis not present

## 2021-10-16 DIAGNOSIS — D649 Anemia, unspecified: Secondary | ICD-10-CM | POA: Diagnosis not present

## 2021-10-16 DIAGNOSIS — N186 End stage renal disease: Secondary | ICD-10-CM | POA: Diagnosis not present

## 2021-10-16 DIAGNOSIS — Z992 Dependence on renal dialysis: Secondary | ICD-10-CM | POA: Diagnosis not present

## 2021-10-16 DIAGNOSIS — R52 Pain, unspecified: Secondary | ICD-10-CM | POA: Diagnosis not present

## 2021-10-16 DIAGNOSIS — N2581 Secondary hyperparathyroidism of renal origin: Secondary | ICD-10-CM | POA: Diagnosis not present

## 2021-10-16 DIAGNOSIS — Z23 Encounter for immunization: Secondary | ICD-10-CM | POA: Diagnosis not present

## 2021-10-16 DIAGNOSIS — D689 Coagulation defect, unspecified: Secondary | ICD-10-CM | POA: Diagnosis not present

## 2021-10-16 DIAGNOSIS — D509 Iron deficiency anemia, unspecified: Secondary | ICD-10-CM | POA: Diagnosis not present

## 2021-10-19 DIAGNOSIS — Z992 Dependence on renal dialysis: Secondary | ICD-10-CM | POA: Diagnosis not present

## 2021-10-19 DIAGNOSIS — N2581 Secondary hyperparathyroidism of renal origin: Secondary | ICD-10-CM | POA: Diagnosis not present

## 2021-10-19 DIAGNOSIS — Z23 Encounter for immunization: Secondary | ICD-10-CM | POA: Diagnosis not present

## 2021-10-19 DIAGNOSIS — N186 End stage renal disease: Secondary | ICD-10-CM | POA: Diagnosis not present

## 2021-10-19 DIAGNOSIS — D509 Iron deficiency anemia, unspecified: Secondary | ICD-10-CM | POA: Diagnosis not present

## 2021-10-19 DIAGNOSIS — R52 Pain, unspecified: Secondary | ICD-10-CM | POA: Diagnosis not present

## 2021-10-19 DIAGNOSIS — D649 Anemia, unspecified: Secondary | ICD-10-CM | POA: Diagnosis not present

## 2021-10-19 DIAGNOSIS — D689 Coagulation defect, unspecified: Secondary | ICD-10-CM | POA: Diagnosis not present

## 2021-10-20 ENCOUNTER — Other Ambulatory Visit: Payer: Self-pay | Admitting: Cardiology

## 2021-10-21 ENCOUNTER — Other Ambulatory Visit: Payer: Medicare Other

## 2021-10-21 ENCOUNTER — Ambulatory Visit: Payer: Medicare Other | Admitting: Vascular Surgery

## 2021-10-21 ENCOUNTER — Telehealth: Payer: Self-pay | Admitting: Oncology

## 2021-10-21 DIAGNOSIS — Z992 Dependence on renal dialysis: Secondary | ICD-10-CM | POA: Diagnosis not present

## 2021-10-21 DIAGNOSIS — Z23 Encounter for immunization: Secondary | ICD-10-CM | POA: Diagnosis not present

## 2021-10-21 DIAGNOSIS — D649 Anemia, unspecified: Secondary | ICD-10-CM | POA: Diagnosis not present

## 2021-10-21 DIAGNOSIS — R52 Pain, unspecified: Secondary | ICD-10-CM | POA: Diagnosis not present

## 2021-10-21 DIAGNOSIS — N186 End stage renal disease: Secondary | ICD-10-CM | POA: Diagnosis not present

## 2021-10-21 DIAGNOSIS — D509 Iron deficiency anemia, unspecified: Secondary | ICD-10-CM | POA: Diagnosis not present

## 2021-10-21 DIAGNOSIS — N2581 Secondary hyperparathyroidism of renal origin: Secondary | ICD-10-CM | POA: Diagnosis not present

## 2021-10-21 DIAGNOSIS — D689 Coagulation defect, unspecified: Secondary | ICD-10-CM | POA: Diagnosis not present

## 2021-10-21 NOTE — Telephone Encounter (Signed)
R/s pt's new pt appt per 1/17 staff msg. Spoke to pt's wife who is aware of new appt date and time.

## 2021-10-23 DIAGNOSIS — D509 Iron deficiency anemia, unspecified: Secondary | ICD-10-CM | POA: Diagnosis not present

## 2021-10-23 DIAGNOSIS — Z992 Dependence on renal dialysis: Secondary | ICD-10-CM | POA: Diagnosis not present

## 2021-10-23 DIAGNOSIS — R52 Pain, unspecified: Secondary | ICD-10-CM | POA: Diagnosis not present

## 2021-10-23 DIAGNOSIS — Z23 Encounter for immunization: Secondary | ICD-10-CM | POA: Diagnosis not present

## 2021-10-23 DIAGNOSIS — D689 Coagulation defect, unspecified: Secondary | ICD-10-CM | POA: Diagnosis not present

## 2021-10-23 DIAGNOSIS — N186 End stage renal disease: Secondary | ICD-10-CM | POA: Diagnosis not present

## 2021-10-23 DIAGNOSIS — D649 Anemia, unspecified: Secondary | ICD-10-CM | POA: Diagnosis not present

## 2021-10-23 DIAGNOSIS — N2581 Secondary hyperparathyroidism of renal origin: Secondary | ICD-10-CM | POA: Diagnosis not present

## 2021-10-26 DIAGNOSIS — N186 End stage renal disease: Secondary | ICD-10-CM | POA: Diagnosis not present

## 2021-10-26 DIAGNOSIS — N2581 Secondary hyperparathyroidism of renal origin: Secondary | ICD-10-CM | POA: Diagnosis not present

## 2021-10-26 DIAGNOSIS — D649 Anemia, unspecified: Secondary | ICD-10-CM | POA: Diagnosis not present

## 2021-10-26 DIAGNOSIS — Z992 Dependence on renal dialysis: Secondary | ICD-10-CM | POA: Diagnosis not present

## 2021-10-26 DIAGNOSIS — D509 Iron deficiency anemia, unspecified: Secondary | ICD-10-CM | POA: Diagnosis not present

## 2021-10-26 DIAGNOSIS — D689 Coagulation defect, unspecified: Secondary | ICD-10-CM | POA: Diagnosis not present

## 2021-10-26 DIAGNOSIS — Z23 Encounter for immunization: Secondary | ICD-10-CM | POA: Diagnosis not present

## 2021-10-26 DIAGNOSIS — R52 Pain, unspecified: Secondary | ICD-10-CM | POA: Diagnosis not present

## 2021-10-27 ENCOUNTER — Inpatient Hospital Stay: Payer: Medicare Other | Admitting: Oncology

## 2021-10-28 ENCOUNTER — Ambulatory Visit: Payer: Medicare Other | Admitting: Vascular Surgery

## 2021-10-28 ENCOUNTER — Other Ambulatory Visit: Payer: Self-pay

## 2021-10-28 ENCOUNTER — Encounter: Payer: Self-pay | Admitting: Vascular Surgery

## 2021-10-28 VITALS — BP 126/73 | HR 75 | Temp 98.4°F | Ht 68.0 in | Wt 138.6 lb

## 2021-10-28 DIAGNOSIS — D649 Anemia, unspecified: Secondary | ICD-10-CM | POA: Diagnosis not present

## 2021-10-28 DIAGNOSIS — N2581 Secondary hyperparathyroidism of renal origin: Secondary | ICD-10-CM | POA: Diagnosis not present

## 2021-10-28 DIAGNOSIS — J449 Chronic obstructive pulmonary disease, unspecified: Secondary | ICD-10-CM | POA: Diagnosis not present

## 2021-10-28 DIAGNOSIS — D689 Coagulation defect, unspecified: Secondary | ICD-10-CM | POA: Diagnosis not present

## 2021-10-28 DIAGNOSIS — N186 End stage renal disease: Secondary | ICD-10-CM

## 2021-10-28 DIAGNOSIS — Z992 Dependence on renal dialysis: Secondary | ICD-10-CM | POA: Diagnosis not present

## 2021-10-28 DIAGNOSIS — D509 Iron deficiency anemia, unspecified: Secondary | ICD-10-CM | POA: Diagnosis not present

## 2021-10-28 DIAGNOSIS — R52 Pain, unspecified: Secondary | ICD-10-CM | POA: Diagnosis not present

## 2021-10-28 DIAGNOSIS — Z23 Encounter for immunization: Secondary | ICD-10-CM | POA: Diagnosis not present

## 2021-10-28 NOTE — Progress Notes (Signed)
[facility] [place]  Patient name: [name] [name] MRN: [id number] DOB: 04/02/1941 Sex: male  REASON FOR VISIT: Follow-up right brachiocephalic AV fistula creation  HPI: [name] [name] is a 81 y.o. male here today for follow-up.  Underwent right brachiocephalic AV fistula creation by myself on 09/29/2021.  He is here today for follow-up.  He denies any symptoms of steal in his right hand.  He has no discomfort related to his surgical incision at the right antecubital space.  Current Outpatient Medications  Medication Sig Dispense Refill   acetaminophen (TYLENOL) 325 MG tablet Take 2 tablets (650 mg total) by mouth every 6 (six) hours as needed for mild pain, fever or headache.     albuterol (PROVENTIL HFA) 108 (90 Base) MCG/ACT inhaler Inhale 2 puffs into the lungs every 4 (four) hours as needed for wheezing or shortness of breath. 54 g 3   amLODipine (NORVASC) 10 MG tablet Take 1 tablet (10 mg total) by mouth daily. 90 tablet 1   b complex-vitamin c-folic acid (NEPHRO-VITE) 0.8 MG TABS tablet TAKE 1 TABLET BY MOUTH EVERYDAY AT BEDTIME 90 tablet 1   budesonide (PULMICORT) 0.25 MG/2ML nebulizer solution USE 1 VIAL  IN  NEBULIZER TWICE  DAILY - rinse mouth after treatment 6 mL 5   Cholecalciferol (VITAMIN D3) 125 MCG (5000 UT) CAPS Take 1 capsule (5,000 Units total) by mouth daily. 30 capsule 0   furosemide (LASIX) 40 MG tablet Take 1 tablet (40 mg total) by mouth daily. 90 tablet 1   HYDROcodone-acetaminophen (NORCO/VICODIN) 5-325 MG tablet Take 1-2 tablets by mouth every 6 (six) hours as needed. 10 tablet 0   ipratropium-albuterol (DUONEB) 0.5-2.5 (3) MG/3ML SOLN USE 1 VIAL IN NEBULIZER 4 TIMES DAILY 180 mL 5   levothyroxine (SYNTHROID) 112 MCG tablet Take 1 tablet (112 mcg total) by mouth daily before breakfast. 90 tablet 3   loratadine (CLARITIN) 10 MG tablet Take 10 mg by mouth daily as needed (for allergies.).      nitroGLYCERIN  (NITROSTAT) 0.4 MG SL tablet Place 1 tablet (0.4 mg total) under the tongue every 5 (five) minutes as needed for chest pain. 6 tablet 1   oxyCODONE-acetaminophen (PERCOCET) 5-325 MG tablet Take 1 tablet by mouth every 6 (six) hours as needed for severe pain. 8 tablet 0   OXYGEN Inhale into the lungs at bedtime.     pravastatin (PRAVACHOL) 40 MG tablet TAKE 1 TABLET BY MOUTH DAILY AT 6 PM. 90 tablet 1   No current facility-administered medications for this visit.     PHYSICAL EXAM: Vitals:   10/28/21 0924  BP: 126/73  Pulse: 75  Temp: 98.4 F (36.9 C)  TempSrc: Temporal  SpO2: 93%  Weight: 138 lb 9.6 oz (62.9 kg)  Height: 5\' 8"  (1.727 m)    GENERAL: The patient is a well-nourished male, in no acute distress. The vital signs are documented above. He has severely calcified distal vessels with a very calcified radial artery at the wrist.  He did not have pulse preoperatively and still does not.  He has excellent maturation of his right upper arm brachiocephalic AV fistula.  He has an excellent thrill in the vein with very superficial location and straight course.  MEDICAL ISSUES: Very good [name] [name] result 1 month out from brachiocephalic fistula creation.  He continues to have no issues with his right IJ tunneled hemodialysis catheter.  He will continue to use his catheter for an additional 2 months for a total  of 12 weeks of fistula maturation.  He is then able to use his fistula.  I am available to remove his catheter at [facility] after successful use of his fistula.  The patient and his wife report that he continues to have very poor appetite and weight loss.  He will discuss this with Dr. Marval Regal at the dialysis center   Rosetta Posner, MD [facility] Tel [phone]  Note: Portions of this report may have been transcribed using voice recognition software.  Every effort has been made to ensure accuracy; however, inadvertent  computerized transcription errors may still be present.

## 2021-10-30 DIAGNOSIS — J449 Chronic obstructive pulmonary disease, unspecified: Secondary | ICD-10-CM | POA: Diagnosis not present

## 2021-10-30 DIAGNOSIS — D689 Coagulation defect, unspecified: Secondary | ICD-10-CM | POA: Diagnosis not present

## 2021-10-30 DIAGNOSIS — N2581 Secondary hyperparathyroidism of renal origin: Secondary | ICD-10-CM | POA: Diagnosis not present

## 2021-10-30 DIAGNOSIS — R52 Pain, unspecified: Secondary | ICD-10-CM | POA: Diagnosis not present

## 2021-10-30 DIAGNOSIS — D649 Anemia, unspecified: Secondary | ICD-10-CM | POA: Diagnosis not present

## 2021-10-30 DIAGNOSIS — Z23 Encounter for immunization: Secondary | ICD-10-CM | POA: Diagnosis not present

## 2021-10-30 DIAGNOSIS — N186 End stage renal disease: Secondary | ICD-10-CM | POA: Diagnosis not present

## 2021-10-30 DIAGNOSIS — Z992 Dependence on renal dialysis: Secondary | ICD-10-CM | POA: Diagnosis not present

## 2021-10-30 DIAGNOSIS — D509 Iron deficiency anemia, unspecified: Secondary | ICD-10-CM | POA: Diagnosis not present

## 2021-11-02 DIAGNOSIS — N2581 Secondary hyperparathyroidism of renal origin: Secondary | ICD-10-CM | POA: Diagnosis not present

## 2021-11-02 DIAGNOSIS — Z23 Encounter for immunization: Secondary | ICD-10-CM | POA: Diagnosis not present

## 2021-11-02 DIAGNOSIS — R52 Pain, unspecified: Secondary | ICD-10-CM | POA: Diagnosis not present

## 2021-11-02 DIAGNOSIS — D689 Coagulation defect, unspecified: Secondary | ICD-10-CM | POA: Diagnosis not present

## 2021-11-02 DIAGNOSIS — Z992 Dependence on renal dialysis: Secondary | ICD-10-CM | POA: Diagnosis not present

## 2021-11-02 DIAGNOSIS — D649 Anemia, unspecified: Secondary | ICD-10-CM | POA: Diagnosis not present

## 2021-11-02 DIAGNOSIS — D509 Iron deficiency anemia, unspecified: Secondary | ICD-10-CM | POA: Diagnosis not present

## 2021-11-02 DIAGNOSIS — N186 End stage renal disease: Secondary | ICD-10-CM | POA: Diagnosis not present

## 2021-11-03 DIAGNOSIS — Z992 Dependence on renal dialysis: Secondary | ICD-10-CM | POA: Diagnosis not present

## 2021-11-03 DIAGNOSIS — N179 Acute kidney failure, unspecified: Secondary | ICD-10-CM | POA: Diagnosis not present

## 2021-11-03 DIAGNOSIS — N186 End stage renal disease: Secondary | ICD-10-CM | POA: Diagnosis not present

## 2021-11-04 ENCOUNTER — Encounter (HOSPITAL_COMMUNITY): Payer: Self-pay | Admitting: Emergency Medicine

## 2021-11-04 ENCOUNTER — Emergency Department (HOSPITAL_COMMUNITY)
Admission: EM | Admit: 2021-11-04 | Discharge: 2021-11-04 | Disposition: A | Discharge status: Home / Self Care | Payer: Medicare Other | Attending: Emergency Medicine | Admitting: Emergency Medicine

## 2021-11-04 ENCOUNTER — Emergency Department (HOSPITAL_COMMUNITY): Payer: Medicare Other

## 2021-11-04 DIAGNOSIS — C651 Malignant neoplasm of right renal pelvis: Secondary | ICD-10-CM | POA: Diagnosis not present

## 2021-11-04 DIAGNOSIS — Z79899 Other long term (current) drug therapy: Secondary | ICD-10-CM | POA: Diagnosis not present

## 2021-11-04 DIAGNOSIS — C8599 Non-Hodgkin lymphoma, unspecified, extranodal and solid organ sites: Secondary | ICD-10-CM | POA: Insufficient documentation

## 2021-11-04 DIAGNOSIS — M545 Low back pain, unspecified: Secondary | ICD-10-CM

## 2021-11-04 DIAGNOSIS — I517 Cardiomegaly: Secondary | ICD-10-CM | POA: Insufficient documentation

## 2021-11-04 DIAGNOSIS — N2889 Other specified disorders of kidney and ureter: Secondary | ICD-10-CM | POA: Diagnosis not present

## 2021-11-04 DIAGNOSIS — J90 Pleural effusion, not elsewhere classified: Secondary | ICD-10-CM | POA: Insufficient documentation

## 2021-11-04 DIAGNOSIS — Z992 Dependence on renal dialysis: Secondary | ICD-10-CM | POA: Insufficient documentation

## 2021-11-04 DIAGNOSIS — M549 Dorsalgia, unspecified: Secondary | ICD-10-CM | POA: Diagnosis present

## 2021-11-04 DIAGNOSIS — K4090 Unilateral inguinal hernia, without obstruction or gangrene, not specified as recurrent: Secondary | ICD-10-CM | POA: Diagnosis not present

## 2021-11-04 DIAGNOSIS — G8929 Other chronic pain: Secondary | ICD-10-CM | POA: Diagnosis not present

## 2021-11-04 DIAGNOSIS — K820 Obstruction of gallbladder: Secondary | ICD-10-CM | POA: Diagnosis not present

## 2021-11-04 LAB — CBC WITH DIFFERENTIAL/PLATELET
Abs Immature Granulocytes: 0.09 10*3/uL — ABNORMAL HIGH (ref 0.00–0.07)
Basophils Absolute: 0.1 10*3/uL (ref 0.0–0.1)
Basophils Relative: 1 %
Eosinophils Absolute: 0.2 10*3/uL (ref 0.0–0.5)
Eosinophils Relative: 1 %
HCT: 34.9 % — ABNORMAL LOW (ref 39.0–52.0)
Hemoglobin: 11.1 g/dL — ABNORMAL LOW (ref 13.0–17.0)
Immature Granulocytes: 1 %
Lymphocytes Relative: 5 %
Lymphs Abs: 0.6 10*3/uL — ABNORMAL LOW (ref 0.7–4.0)
MCH: 32.1 pg (ref 26.0–34.0)
MCHC: 31.8 g/dL (ref 30.0–36.0)
MCV: 100.9 fL — ABNORMAL HIGH (ref 80.0–100.0)
Monocytes Absolute: 1.1 10*3/uL — ABNORMAL HIGH (ref 0.1–1.0)
Monocytes Relative: 9 %
Neutro Abs: 10.3 10*3/uL — ABNORMAL HIGH (ref 1.7–7.7)
Neutrophils Relative %: 83 %
Platelets: 343 10*3/uL (ref 150–400)
RBC: 3.46 MIL/uL — ABNORMAL LOW (ref 4.22–5.81)
RDW: 15.9 % — ABNORMAL HIGH (ref 11.5–15.5)
WBC: 12.4 10*3/uL — ABNORMAL HIGH (ref 4.0–10.5)
nRBC: 0 % (ref 0.0–0.2)

## 2021-11-04 LAB — COMPREHENSIVE METABOLIC PANEL
ALT: 7 U/L (ref 0–44)
AST: 21 U/L (ref 15–41)
Albumin: 2.8 g/dL — ABNORMAL LOW (ref 3.5–5.0)
Alkaline Phosphatase: 51 U/L (ref 38–126)
Anion gap: 12 (ref 5–15)
BUN: 27 mg/dL — ABNORMAL HIGH (ref 8–23)
CO2: 30 mmol/L (ref 22–32)
Calcium: 9.3 mg/dL (ref 8.9–10.3)
Chloride: 94 mmol/L — ABNORMAL LOW (ref 98–111)
Creatinine, Ser: 4.24 mg/dL — ABNORMAL HIGH (ref 0.61–1.24)
GFR, Estimated: 13 mL/min — ABNORMAL LOW (ref >60)
Glucose, Bld: 115 mg/dL — ABNORMAL HIGH (ref 70–99)
Potassium: 3.8 mmol/L (ref 3.5–5.1)
Sodium: 136 mmol/L (ref 135–145)
Total Bilirubin: 0.7 mg/dL (ref 0.3–1.2)
Total Protein: 5.9 g/dL — ABNORMAL LOW (ref 6.5–8.1)

## 2021-11-04 MED ORDER — LIDOCAINE 5 % EX PTCH
1.0000 | MEDICATED_PATCH | CUTANEOUS | 0 refills | Status: AC
Start: 2021-11-04 — End: ?

## 2021-11-04 MED ORDER — LIDOCAINE 5 % EX PTCH
1.0000 | MEDICATED_PATCH | CUTANEOUS | Status: DC
Start: 2021-11-04 — End: 2021-11-04
  Administered 2021-11-04: 1 via TRANSDERMAL
  Filled 2021-11-04: qty 1

## 2021-11-04 MED ORDER — OXYCODONE HCL 5 MG PO TABS
5.0000 mg | ORAL_TABLET | Freq: Once | ORAL | Status: AC
  Administered 2021-11-04: 5 mg via ORAL
  Filled 2021-11-04: qty 1

## 2021-11-04 MED ORDER — OXYCODONE HCL 5 MG PO TABS: 5.0000 mg | ORAL_TABLET | ORAL | 0 refills | Status: DC | PRN

## 2021-11-04 NOTE — ED Notes (Signed)
DC instructions reviewed by Mali Grose-RN.  Pt verbalized understanding. PT DC

## 2021-11-04 NOTE — Discharge Instructions (Signed)
Apply Lidoderm patch to your skin for pain control. Take oxycodone as needed as prescribed for pain. Take Colace as needed as a stool softener.  As discussed, pain medications can cause constipation.  Return to the emergency room for new or worsening symptoms otherwise follow-up with your dialysis center tomorrow for missed dialysis today.  Recheck with your care team as planned for further work-up.

## 2021-11-04 NOTE — ED Provider Notes (Signed)
[facility] DEPARTMENT Provider Note   CSN: [id number] Arrival date & time: 11/04/21  1042     History  Chief Complaint  Patient presents with   Back Pain    [name] [name] is a 81 y.o. male.  81 year old male presents with complaint of pain in his back.  Patient attends dialysis Monday, Wednesday, Friday, is currently undergoing oncology work-up for mass on his kidney with concerning lymphadenopathy.  Denies falls, injuries, fever.  States that he is lost a whole lot of weight because of loss of appetite and his spine is generally tender.      Home Medications Prior to Admission medications   Medication Sig Start Date End Date Taking? Authorizing Provider  lidocaine (LIDODERM) 5 % Place 1 patch onto the skin daily. Remove & Discard patch within 12 hours or as directed by MD 11/04/21  Yes Tacy Learn, PA-C  oxyCODONE (ROXICODONE) 5 MG immediate release tablet Take 1 tablet (5 mg total) by mouth every 4 (four) hours as needed for severe pain. 11/04/21  Yes Tacy Learn, PA-C  acetaminophen (TYLENOL) 325 MG tablet Take 2 tablets (650 mg total) by mouth every 6 (six) hours as needed for mild pain, fever or headache. 06/12/21   Angiulli, Lavon Paganini, PA-C  albuterol (PROVENTIL HFA) 108 (90 Base) MCG/ACT inhaler Inhale 2 puffs into the lungs every 4 (four) hours as needed for wheezing or shortness of breath. 08/10/21   Claretta Fraise, MD  amLODipine (NORVASC) 10 MG tablet Take 1 tablet (10 mg total) by mouth daily. 09/21/21   Claretta Fraise, MD  b complex-vitamin c-folic acid (NEPHRO-VITE) 0.8 MG TABS tablet TAKE 1 TABLET BY MOUTH EVERYDAY AT BEDTIME 09/21/21   Claretta Fraise, MD  budesonide (PULMICORT) 0.25 MG/2ML nebulizer solution USE 1 VIAL  IN  NEBULIZER TWICE  DAILY - rinse mouth after treatment 07/30/21   Claretta Fraise, MD  Cholecalciferol (VITAMIN D3) 125 MCG (5000 UT) CAPS Take 1 capsule (5,000 Units total) by mouth daily. 06/12/21   Angiulli, Lavon Paganini,  PA-C  furosemide (LASIX) 40 MG tablet Take 1 tablet (40 mg total) by mouth daily. 09/21/21   Claretta Fraise, MD  HYDROcodone-acetaminophen (NORCO/VICODIN) 5-325 MG tablet Take 1-2 tablets by mouth every 6 (six) hours as needed. 09/10/21   Regan Lemming, MD  ipratropium-albuterol (DUONEB) 0.5-2.5 (3) MG/3ML SOLN USE 1 VIAL IN NEBULIZER 4 TIMES DAILY 08/31/21   Claretta Fraise, MD  levothyroxine (SYNTHROID) 112 MCG tablet Take 1 tablet (112 mcg total) by mouth daily before breakfast. 09/21/21   Claretta Fraise, MD  loratadine (CLARITIN) 10 MG tablet Take 10 mg by mouth daily as needed (for allergies.).     [provider]  nitroGLYCERIN (NITROSTAT) 0.4 MG SL tablet Place 1 tablet (0.4 mg total) under the tongue every 5 (five) minutes as needed for chest pain. 03/18/17   Shirley Friar, PA-C  oxyCODONE-acetaminophen (PERCOCET) 5-325 MG tablet Take 1 tablet by mouth every 6 (six) hours as needed for severe pain. 09/29/21   Rosetta Posner, MD  OXYGEN Inhale into the lungs at bedtime.    [provider]  pravastatin (PRAVACHOL) 40 MG tablet TAKE 1 TABLET BY MOUTH DAILY AT 6 PM. 09/21/21   Claretta Fraise, MD      Allergies    Patient has no known allergies.    Review of Systems   Review of Systems  Constitutional:  Negative for fever.  Respiratory:  Negative for shortness of breath.  Cardiovascular:  Negative for chest pain.  Gastrointestinal:  Positive for constipation. Negative for abdominal pain, nausea and vomiting.  Genitourinary:  Negative for dysuria.  Musculoskeletal:  Positive for back pain. Negative for gait problem.  Skin:  Negative for rash and wound.  Allergic/Immunologic: Negative for immunocompromised state.  Neurological:  Negative for weakness and numbness.  Hematological:  Does not bruise/bleed easily.  Psychiatric/Behavioral:  Negative for confusion.    Physical Exam Updated Vital Signs BP (!) 118/59 (BP Location: Left Arm)    Pulse 67    Temp 97.8  F (36.6 C) (Oral)    Resp 16    SpO2 94%  Physical Exam Vitals and nursing note reviewed.  Constitutional:      General: He is not in acute distress.    Appearance: He is well-developed. He is not diaphoretic.  HENT:     Head: Normocephalic and atraumatic.  Cardiovascular:     Rate and Rhythm: Normal rate and regular rhythm.     Heart sounds: Normal heart sounds.  Pulmonary:     Effort: Pulmonary effort is normal.     Breath sounds: Normal breath sounds.  Abdominal:     Palpations: Abdomen is soft.     Tenderness: There is no abdominal tenderness.  Musculoskeletal:        General: Tenderness present. No swelling, deformity or signs of injury.       Back:     Comments: Slight scoliosis versus positioning, midline tenderness without step-off or deformity.  No paraspinous tenderness  Skin:    General: Skin is warm and dry.     Findings: No lesion or rash.  Neurological:     Mental Status: He is alert and oriented to person, place, and time.     Sensory: No sensory deficit.     Motor: No weakness.  Psychiatric:        Behavior: Behavior normal.    ED Results / Procedures / Treatments   Labs (all labs ordered are listed, but only abnormal results are displayed) Labs Reviewed  CBC WITH DIFFERENTIAL/PLATELET - Abnormal; Notable for the following components:      Result Value   WBC 12.4 (*)    RBC 3.46 (*)    Hemoglobin 11.1 (*)    HCT 34.9 (*)    MCV 100.9 (*)    RDW 15.9 (*)    Neutro Abs 10.3 (*)    Lymphs Abs 0.6 (*)    Monocytes Absolute 1.1 (*)    Abs Immature Granulocytes 0.09 (*)    All other components within normal limits  COMPREHENSIVE METABOLIC PANEL - Abnormal; Notable for the following components:   Chloride 94 (*)    Glucose, Bld 115 (*)    BUN 27 (*)    Creatinine, Ser 4.24 (*)    Total Protein 5.9 (*)    Albumin 2.8 (*)    GFR, Estimated 13 (*)    All other components within normal limits    EKG None  Radiology CT Renal Stone Study  Result  Date: 11/04/2021 CLINICAL DATA:  Flank pain.  Clinical concern for nephrolithiasis. EXAM: CT ABDOMEN AND PELVIS WITHOUT CONTRAST TECHNIQUE: Multidetector CT imaging of the abdomen and pelvis was performed following the standard protocol without IV contrast. RADIATION DOSE REDUCTION: This exam was performed according to the departmental dose-optimization program which includes automated exposure control, adjustment of the mA and/or kV according to patient size and/or use of iterative reconstruction technique. COMPARISON:  09/10/2021 FINDINGS: Lower chest: Partially  included moderately large bilateral pleural effusions without significant change. No significant change in adjacent left lower lobe compressive atelectasis. 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 Hepatobiliary: Contracted gallbladder, without significant change. Normal appearing liver. Pancreas: Unremarkable. No pancreatic ductal dilatation or surrounding inflammatory changes. Spleen: Normal in size without focal abnormality. Adrenals/Urinary Tract: Interval right inferior adrenal mass or new enlarged lymph node, measuring 2.4 x 1.5 cm on image number 18/3. Normal appearing left adrenal gland. Mildly progressive diffuse soft tissue enlargement of the right kidney, currently measuring 9.1 x 6.9 cm on image number 28/3, previously 8.9 x 6.7 cm at that level. There is mildly progressive encroachment on the lower pole collecting system on the right. Mildly progressive perinephric soft tissue stranding is also noted on the right as well as encased intrarenal arterial calcifications. The left kidney remains small. No visible bladder or ureteral abnormalities. Stomach/Bowel: Unremarkable stomach, small bowel and colon. The appendix is not visualized. No evidence of appendicitis. Vascular/Lymphatic: The previously demonstrated probable enlarged lymph node medial to the right kidney is larger. This previously measured 3.1 cm in maximum diameter and  currently measures 3.3 cm in corresponding diameter on image number 23/3. This is now contiguous with the right renal mass and extends to the aorta with complete encasement of the right renal artery. A previously demonstrated 1.3 cm short axis inferior right para-aortic node currently has a short axis diameter of 1.5 cm on image number 37/3. This is partially encasing the abdominal aorta and inferior vena cava. Interval new enlarged lymph node or mass in the inferior aspect of the right adrenal gland, measuring 2.4 x 1.5 cm on image number 18/3. Extensive, dense atheromatous arterial calcifications without aneurysm. Reproductive: Mildly enlarged prostate gland with coarse calcifications. Other: Diffuse subcutaneous edema. Small right inguinal hernia containing fat. Musculoskeletal: Lumbar and lower thoracic spine degenerative changes. IMPRESSION: 1. 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 A renal carcinoma is less likely but not excluded. 2. 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 3. Stable bilateral pleural effusions and left lower lobe atelectasis. 4. Stable cardiomegaly and extensive atheromatous arterial calcifications. Electronically Signed   By: Claudie Revering M.D.   On: 11/04/2021 13:09    Procedures Procedures    Medications Ordered in ED Medications  oxyCODONE (Oxy IR/ROXICODONE) immediate release tablet 5 mg (has no administration in time range)  lidocaine (LIDODERM) 5 % 1 patch (has no administration in time range)    ED Course/ Medical Decision Making/ A&P Clinical Course as of 11/04/21 1448  Wed Nov 05, 6935  [id number] 81 year old male with presentation as above with complaint of back pain without fall or injury.  States he missed dialysis today because he was feeling poorly, denies shortness of breath, attended  last full dialysis session on Monday.  On exam, he is found to have midline tenderness without step-off or deformity.  CT reviewed the patient is aware of the enlarged lymph nodes and mass on his kidney, he is scheduled to follow-up with oncology and has had a recent biopsy.  No acute bony abnormality.  Plan is to place a Lidoderm patch, will give oxycodone in the ER with prescription for same.  Recommend Colace as a stool softener.  Wife suspects patient is not necessarily constipated so much is just not eating and producing much stool. Patient been taking Tylenol at home without any relief.  Plan is to try oxycodone and Lidoderm and follow-up with his provider and oncology team. Labs reviewed, potassium is normal.  Mild leukocytosis with white count of 12.4, patient afebrile. Case discussed with Dr. Tinnie Gens, ER attending who agrees with plan of care. [LM]    Clinical Course User Index [LM] Tacy Learn, PA-C                           Medical Decision Making Amount and/or Complexity of Data Reviewed Labs: ordered. Radiology: ordered.  Risk Prescription drug management.          Final Clinical Impression(s) / ED Diagnoses Final diagnoses:  Chronic midline low back pain without sciatica    Rx / DC Orders ED Discharge Orders          Ordered    oxyCODONE (ROXICODONE) 5 MG immediate release tablet  Every 4 hours PRN        11/04/21 1439    lidocaine (LIDODERM) 5 %  Every 24 hours        11/04/21 1439              Tacy Learn, PA-C 11/04/21 1448    Kommor, Debe Coder, MD 11/04/21 1544

## 2021-11-04 NOTE — ED Provider Triage Note (Addendum)
Emergency Medicine Provider Triage Evaluation Note  [name] [name] , a 81 y.o. male  was evaluated in triage.  Pt complains of back pain, weakness, decreased appetite.  Said that his back has been hurting him for several days.  He has a history of ESRD and relates his back pain to his kidney problems.  Denies fever or vomiting.  Denies falls or injuries.  Review of Systems  Positive: Back pain Negative: Fever, vomiting  Physical Exam  BP (!) 118/59 (BP Location: Left Arm)    Pulse 67    Temp 97.8 F (36.6 C) (Oral)    Resp 16    SpO2 94%  Gen:   Awake, no distress   Resp:  Normal effort  MSK:   Moves extremities without difficulty  Other:  Abdomen is soft and nontender, patient reports tenderness to palpation over the lower T-spine and upper L-spine, no step-offs  Medical Decision Making  Medically screening exam initiated at 12:07 PM.  Appropriate orders placed.  [name] [name] was informed that the remainder of the evaluation will be completed by another provider, this initial triage assessment does not replace that evaluation, and the importance of remaining in the ED until their evaluation is complete.     Carlisle Cater, PA-C 11/04/21 1208    Carlisle Cater, PA-C 11/04/21 1231

## 2021-11-04 NOTE — ED Triage Notes (Signed)
Patient here for back pain that started six months ago, patient also reports history of dialysis. MWF schedule, last treatment on Monday, access in right chest.  Patient points to protrusion on spine and states that is what is hurting. Patient alert, oriented, and in no apparent distress at this time.

## 2021-11-06 DIAGNOSIS — N186 End stage renal disease: Secondary | ICD-10-CM | POA: Diagnosis not present

## 2021-11-06 DIAGNOSIS — Z992 Dependence on renal dialysis: Secondary | ICD-10-CM | POA: Diagnosis not present

## 2021-11-06 DIAGNOSIS — D509 Iron deficiency anemia, unspecified: Secondary | ICD-10-CM | POA: Diagnosis not present

## 2021-11-06 DIAGNOSIS — N2581 Secondary hyperparathyroidism of renal origin: Secondary | ICD-10-CM | POA: Diagnosis not present

## 2021-11-06 DIAGNOSIS — R52 Pain, unspecified: Secondary | ICD-10-CM | POA: Diagnosis not present

## 2021-11-06 DIAGNOSIS — D649 Anemia, unspecified: Secondary | ICD-10-CM | POA: Diagnosis not present

## 2021-11-06 DIAGNOSIS — D689 Coagulation defect, unspecified: Secondary | ICD-10-CM | POA: Diagnosis not present

## 2021-11-09 DIAGNOSIS — D509 Iron deficiency anemia, unspecified: Secondary | ICD-10-CM | POA: Diagnosis not present

## 2021-11-09 DIAGNOSIS — D689 Coagulation defect, unspecified: Secondary | ICD-10-CM | POA: Diagnosis not present

## 2021-11-09 DIAGNOSIS — R52 Pain, unspecified: Secondary | ICD-10-CM | POA: Diagnosis not present

## 2021-11-09 DIAGNOSIS — Z992 Dependence on renal dialysis: Secondary | ICD-10-CM | POA: Diagnosis not present

## 2021-11-09 DIAGNOSIS — D649 Anemia, unspecified: Secondary | ICD-10-CM | POA: Diagnosis not present

## 2021-11-09 DIAGNOSIS — N2581 Secondary hyperparathyroidism of renal origin: Secondary | ICD-10-CM | POA: Diagnosis not present

## 2021-11-09 DIAGNOSIS — N186 End stage renal disease: Secondary | ICD-10-CM | POA: Diagnosis not present

## 2021-11-10 ENCOUNTER — Other Ambulatory Visit: Payer: Self-pay

## 2021-11-10 ENCOUNTER — Inpatient Hospital Stay: Payer: Medicare Other | Attending: Oncology | Admitting: Oncology

## 2021-11-10 ENCOUNTER — Other Ambulatory Visit: Payer: Self-pay | Admitting: Oncology

## 2021-11-10 VITALS — BP 129/47 | HR 64 | Temp 97.6°F | Resp 15 | Ht 68.0 in | Wt 133.8 lb

## 2021-11-10 DIAGNOSIS — C786 Secondary malignant neoplasm of retroperitoneum and peritoneum: Secondary | ICD-10-CM | POA: Diagnosis not present

## 2021-11-10 DIAGNOSIS — M545 Low back pain, unspecified: Secondary | ICD-10-CM | POA: Insufficient documentation

## 2021-11-10 DIAGNOSIS — C801 Malignant (primary) neoplasm, unspecified: Secondary | ICD-10-CM | POA: Diagnosis not present

## 2021-11-10 DIAGNOSIS — R634 Abnormal weight loss: Secondary | ICD-10-CM | POA: Insufficient documentation

## 2021-11-10 MED ORDER — MEGESTROL ACETATE 400 MG/10ML PO SUSP: 400.0000 mg | Freq: Every day | ORAL | 0 refills | Status: AC

## 2021-11-10 NOTE — Progress Notes (Signed)
Reason for the request:    Retroperitoneal malignancy  HPI: I was asked by Dr. Claudia Desanctis to evaluate [name] [name] for the evaluation of retroperitoneal lymphadenopathy.  He is an 81 year old man with history of fall hypertension, coronary disease and currently renal failure hemodialysis dependent.  He is a status post hospitalization back in September 2022 where he had worsening renal failure that resulted in an hemodialysis.  At that time he had developed gross hematuria and underwent a CT scan of the abdomen and pelvis by Dr. Claudia Desanctis which showed diffuse edema of the right kidney which has increased over previous examination.  There is extensive right renal vascular calcification and upper abdominal adenopathy with a right retrocaval lymph node measuring 2.1 cm compared to 1.7 cm previously.  Aortocaval lymph node is measuring 1.1 cm.  Repeat scan done on September 10, 2021 showed ongoing right nephromegaly with progressive right retroperitoneal soft tissue lymphadenopathy encasing the right main renal vessels.  These appear to have enlarged since October 2022.  He underwent CT-guided retroperitoneal mass biopsy completed on September 22, 2021.  The results showed a poorly differentiated carcinoma with extensive necrosis with immunohistochemical stains are interrogated which showed negative marker for renal cell markers: PAX8 and CD10.  The tumor is strongly positive for pancytokeratin AE1/AE3 and CX 7 and has partial weak moderate nuclear reactivity for TTF-1.  There is also equivocal CDX2 with negative CK20.  This immunoprofile is not specific could also be a primary non-small cell lung cancer.  He was seen in the emergency department on November 04, 2021 for pain and hematuria.  CT renal stone done on November 04, 2021 showed progressive soft tissue enlargement of the right kidney with appearance compatible with renal lymphoma although carcinoma could not be ruled out.  Progressive metastatic lymphadenitis  retroperitoneal adenopathy also noted.  The conglomerate mass measuring 2.4 x 1.5 cm which also developed new encasing the right adrenal gland.  Clinically, he reports continue to feel poorly and experiencing functional decline.  He has continued to report lower back and pelvic pain and has reported closed to 50 pound weight loss.  He is limited in his mobility and for the most part confined to chair and bed.  He still getting dialysis 3 times a week.   He does not report any headaches, blurry vision, syncope or seizures. Does not report any fevers, chills or sweats.  Does not report any cough, wheezing or hemoptysis.  Does not report any chest pain, palpitation, orthopnea or leg edema.  Does not report any nausea, vomiting or abdominal pain.  Does not report any constipation or diarrhea.  Does not report any skeletal complaints.    Does not report frequency, urgency or hematuria.  Does not report any skin rashes or lesions. Does not report any heat or cold intolerance.  Does not report any lymphadenopathy or petechiae.  Does not report any anxiety or depression.  Remaining review of systems is negative.     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    COPD (chronic obstructive pulmonary disease) (HCC)    on home oxygen, 2 liters at night10/2015   Esophageal reflux  Gallstones    Gastric antral vascular ectasia 2013   Gout    Heme positive stool    Hepatomegaly    Hiatal hernia    History of blood transfusion ~ 2012   "blood count dropped; had to get 3 units"   Hyperlipidemia    Hypothyroidism    Other and unspecified coagulation defects    Personal history of colonic polyps 05/29/2010    TUBULAR ADENOMA   Unspecified essential hypertension   :   Past Surgical History:  Procedure Laterality Date   AV FISTULA PLACEMENT Right 09/29/2021   Procedure: RIGHT ARM ARTERIOVENOUS (AV) FISTULA CREATION;  Surgeon: Rosetta Posner, MD;  Location: [facility];  Service: Vascular;  Laterality: Right;   CARDIAC CATHETERIZATION N/A 07/15/2016   Procedure: Right/Left Heart Cath and Coronary/Graft Angiography;  Surgeon: Belva Crome, MD;  Location: [facility];  Service: Cardiovascular;  Laterality: N/A;   cholecystomy tube  12/28/2014 - 01/06/2015   tube clogged with debris, removed and IR unable to place new tube.    COLONOSCOPY  10/05/2011   Dr. Sharlett Iles: no polyps or evidence of active bleeding   CORONARY ANGIOPLASTY WITH STENT PLACEMENT  ~ 2008; 07/08/2014   "1 + 3"   CORONARY ARTERY BYPASS GRAFT  10/04/1996   CABG X4   ESOPHAGOGASTRODUODENOSCOPY  10/05/2011   Dr. 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 Sharlett Iles: AVM at 40 and blood at 30 minutes beyond first duodenal image but not actual lesion seen. If persistent IDA, bleeding, recommend enteroscopy with ablation    HERNIA REPAIR     ICD IMPLANT N/A 04/05/2017   Procedure: ICD Implant;  Surgeon: Evans Lance, MD;  Location: [facility];  Service: Cardiovascular;  Laterality: N/A;   IR FLUORO GUIDE CV LINE RIGHT  05/27/2021   IR FLUORO GUIDE CV LINE RIGHT  06/02/2021   IR US GUIDE VASC ACCESS RIGHT  05/27/2021   LEFT AND RIGHT HEART CATHETERIZATION WITH CORONARY/GRAFT ANGIOGRAM N/A 07/09/2014   Right and left heart cath, bare metal stent to SVG to RCA. Sinclair Grooms, MD;    RENAL BIOPSY Right    UMBILICAL HERNIA REPAIR  06/04/1969  :   Current  Outpatient Medications:    acetaminophen (TYLENOL) 325 MG tablet, Take 2 tablets (650 mg total) by mouth every 6 (six) hours as needed for mild pain, fever or headache., Disp: , Rfl:    albuterol (PROVENTIL HFA) 108 (90 Base) MCG/ACT inhaler, Inhale 2 puffs into the lungs every 4 (four) hours as needed for wheezing or shortness of breath., Disp: 54 g, Rfl: 3   amLODipine (NORVASC) 10 MG tablet, Take 1 tablet (10 mg total) by mouth daily., Disp: 90 tablet, Rfl: 1   b complex-vitamin c-folic acid (NEPHRO-VITE) 0.8 MG TABS tablet, TAKE 1 TABLET BY MOUTH EVERYDAY AT BEDTIME, Disp: 90 tablet, Rfl: 1   budesonide (PULMICORT) 0.25 MG/2ML nebulizer solution, USE 1 VIAL  IN  NEBULIZER TWICE  DAILY - rinse mouth after treatment, Disp: 6 mL, Rfl: 5   Cholecalciferol (VITAMIN D3) 125 MCG (5000 UT) CAPS, Take 1 capsule (5,000 Units total) by mouth daily., Disp: 30 capsule, Rfl: 0   furosemide (LASIX) 40 MG tablet, Take 1 tablet (40 mg total) by mouth  daily., Disp: 90 tablet, Rfl: 1   HYDROcodone-acetaminophen (NORCO/VICODIN) 5-325 MG tablet, Take 1-2 tablets by mouth every 6 (six) hours as needed., Disp: 10 tablet, Rfl: 0   ipratropium-albuterol (DUONEB) 0.5-2.5 (3) MG/3ML SOLN, USE 1 VIAL IN NEBULIZER 4 TIMES DAILY, Disp: 180 mL, Rfl: 5   levothyroxine (SYNTHROID) 112 MCG tablet, Take 1 tablet (112 mcg total) by mouth daily before breakfast., Disp: 90 tablet, Rfl: 3   lidocaine (LIDODERM) 5 %, Place 1 patch onto the skin daily. Remove & Discard patch within 12 hours or as directed by MD, Disp: 30 patch, Rfl: 0   loratadine (CLARITIN) 10 MG tablet, Take 10 mg by mouth daily as needed (for allergies.). , Disp: , Rfl:    nitroGLYCERIN (NITROSTAT) 0.4 MG SL tablet, Place 1 tablet (0.4 mg total) under the tongue every 5 (five) minutes as needed for chest pain., Disp: 6 tablet, Rfl: 1   oxyCODONE (ROXICODONE) 5 MG immediate release tablet, Take 1 tablet (5 mg total) by mouth every 4 (four) hours as needed for severe  pain., Disp: 15 tablet, Rfl: 0   oxyCODONE-acetaminophen (PERCOCET) 5-325 MG tablet, Take 1 tablet by mouth every 6 (six) hours as needed for severe pain., Disp: 8 tablet, Rfl: 0   OXYGEN, Inhale into the lungs at bedtime., Disp: , Rfl:    pravastatin (PRAVACHOL) 40 MG tablet, TAKE 1 TABLET BY MOUTH DAILY AT 6 PM., Disp: 90 tablet, Rfl: 1:  No Known Allergies:   Family History  Problem Relation Age of Onset   Leukemia Mother    Kidney disease Mother        kidney removed    Heart attack Brother    Heart disease Brother    Heart disease Father    Stomach cancer Sister    Stomach cancer Sister    Early death Brother    Hypertension Other        family    Colon cancer Neg Hx   :   Social History   Socioeconomic History   Marital status: Married    Spouse name: [name]   Number of children: 2   Years of education: 12   Highest education level: High school graduate  Occupational History   Occupation: retired    Comment: [employer]  Tobacco Use   Smoking status: Former    Packs/day: 1.00    Years: 42.00    Pack years: 42.00    Types: Cigarettes    Quit date: 12/16/1996    Years since quitting: 24.9   Smokeless tobacco: Never  Vaping Use   Vaping Use: Never used  Substance and Sexual Activity   Alcohol use: Yes    Alcohol/week: 1.0 standard drink    Types: 1 Cans of beer per week    Comment: 1 per month   Drug use: No   Sexual activity: Not Currently    Birth control/protection: None  Other Topics Concern   Not on file  Social History Narrative   Not on file   Social Determinants of Health   Financial Resource Strain: Not on file  Food Insecurity: Not on file  Transportation Needs: Not on file  Physical Activity: Not on file  Stress: Not on file  Social Connections: Not on file  Intimate Partner Violence: Not on file  :  Pertinent items are noted in HPI.  Exam: Blood pressure (!) 129/47, pulse 64, temperature 97.6 F (36.4 C), temperature  source Temporal, resp. rate 15, height $RemoveBe'5\' 8"'vFrvmPEEM$  (1.727  m), weight 133 lb 12.8 oz (60.7 kg), SpO2 94 %.  ECOG 2 General appearance: alert and cooperative appeared without distress. Head: atraumatic without any abnormalities. Eyes: conjunctivae/corneas clear. PERRL.  Sclera anicteric. Throat: lips, mucosa, and tongue normal; without oral thrush or ulcers. Resp: clear to auscultation bilaterally without rhonchi, wheezes or dullness to percussion. Cardio: regular rate and rhythm, S1, S2 normal, no murmur, click, rub or gallop GI: soft, non-tender; bowel sounds normal; no masses,  no organomegaly Skin: Skin color, texture, turgor normal. No rashes or lesions Lymph nodes: Cervical, supraclavicular, and axillary nodes normal. Neurologic: Grossly normal without any motor, sensory or deep tendon reflexes. Musculoskeletal: No joint deformity or effusion.  No results for input(s): WBC, HGB, HCT, PLT in the last 72 hours. No results for input(s): NA, K, CL, CO2, GLUCOSE, BUN, CREATININE, CALCIUM in the last 72 hours.     CT Renal Stone Study  Result Date: 11/04/2021 CLINICAL DATA:  Flank pain.  Clinical concern for nephrolithiasis. EXAM: CT ABDOMEN AND PELVIS WITHOUT CONTRAST TECHNIQUE: Multidetector CT imaging of the abdomen and pelvis was performed following the standard protocol without IV contrast. RADIATION DOSE REDUCTION: This exam was performed according to the departmental dose-optimization program which includes automated exposure control, adjustment of the mA and/or kV according to patient size and/or use of iterative reconstruction technique. COMPARISON:  09/10/2021 FINDINGS: Lower chest: Partially included moderately large bilateral pleural effusions without significant change. No significant change in adjacent left lower lobe compressive atelectasis. 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 Hepatobiliary: Contracted gallbladder, without significant change. Normal appearing liver.  Pancreas: Unremarkable. No pancreatic ductal dilatation or surrounding inflammatory changes. Spleen: Normal in size without focal abnormality. Adrenals/Urinary Tract: Interval right inferior adrenal mass or new enlarged lymph node, measuring 2.4 x 1.5 cm on image number 18/3. Normal appearing left adrenal gland. Mildly progressive diffuse soft tissue enlargement of the right kidney, currently measuring 9.1 x 6.9 cm on image number 28/3, previously 8.9 x 6.7 cm at that level. There is mildly progressive encroachment on the lower pole collecting system on the right. Mildly progressive perinephric soft tissue stranding is also noted on the right as well as encased intrarenal arterial calcifications. The left kidney remains small. No visible bladder or ureteral abnormalities. Stomach/Bowel: Unremarkable stomach, small bowel and colon. The appendix is not visualized. No evidence of appendicitis. Vascular/Lymphatic: The previously demonstrated probable enlarged lymph node medial to the right kidney is larger. This previously measured 3.1 cm in maximum diameter and currently measures 3.3 cm in corresponding diameter on image number 23/3. This is now contiguous with the right renal mass and extends to the aorta with complete encasement of the right renal artery. A previously demonstrated 1.3 cm short axis inferior right para-aortic node currently has a short axis diameter of 1.5 cm on image number 37/3. This is partially encasing the abdominal aorta and inferior vena cava. Interval new enlarged lymph node or mass in the inferior aspect of the right adrenal gland, measuring 2.4 x 1.5 cm on image number 18/3. Extensive, dense atheromatous arterial calcifications without aneurysm. Reproductive: Mildly enlarged prostate gland with coarse calcifications. Other: Diffuse subcutaneous edema. Small right inguinal hernia containing fat. Musculoskeletal: Lumbar and lower thoracic spine degenerative changes. IMPRESSION: 1. Mildly  progressive soft tissue enlargement of the right kidney with an appearance most compatible with renal lymphoma. A renal carcinoma is less likely but not excluded. 2. Progressive metastatic/lymphomatous retroperitoneal adenopathy including a new enlarged lymph node or adrenal mass superior to the  right kidney. 3. Stable bilateral pleural effusions and left lower lobe atelectasis. 4. Stable cardiomegaly and extensive atheromatous arterial calcifications. Electronically Signed   By: Claudie Revering M.D.   On: 11/04/2021 13:09    Assessment and Plan:   81 year old with:  1.  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  This was noted in September 2022 and a biopsy completed in December 2022.  The natural course of this disease and differential diagnosis was reviewed at this time.  This could include lung primary, urothelial carcinoma, truly unknown primary versus other etiologies.  Additional molecular testing and PD-L1 testing is currently pending which could help with therapeutic purposes moving forward.  Management options were reviewed at this time.  The only option to treat this disease would be palliative systemic therapy.  This therapy does not offer any cure it might reduce the size of his pelvic adenopathy and give palliation of symptoms.  These options would include systemic chemotherapy versus immunotherapy if he has PD-L1 overexpression.  Complication associated with systemic therapy were discussed at this time and the possible benefits were reviewed.  His performance status is poor and declining rapidly and I think the benefit from systemic therapy is marginal.  Alternative treatment options include supportive care only and hospice enrollment was discussed.  After discussion today, he opted against systemic therapy at this time.  He feels he is too weak to withstand any treatments and I agree.  I recommended supportive care  only and consideration for hospice agency enrollment once the family is ready.  I will not schedule any additional testing including imaging of the chest or any anticancer treatment therapy based on the discussion today.  2.  Weight loss: Prescription for Megace will be available to him.  3.  Lower back pain: Manageable at this time with the current pain regimen.  4.  Prognosis and goals of care: His prognosis is poor with limited life expectancy of less than 6 months.  Any treatment he receives is palliative at best.  I feel chemotherapy can offer little palliation at this time.  5.  Follow-up: I am happy to see him in the future as needed.  60  minutes were dedicated to this visit. The time was spent on reviewing pathology results, imaging studies, discussing treatment options, discussing differential diagnosis and answering questions regarding future plan.     A copy of this consult has been forwarded to the requesting physician.

## 2021-11-11 DIAGNOSIS — D649 Anemia, unspecified: Secondary | ICD-10-CM | POA: Diagnosis not present

## 2021-11-11 DIAGNOSIS — D689 Coagulation defect, unspecified: Secondary | ICD-10-CM | POA: Diagnosis not present

## 2021-11-11 DIAGNOSIS — D509 Iron deficiency anemia, unspecified: Secondary | ICD-10-CM | POA: Diagnosis not present

## 2021-11-11 DIAGNOSIS — Z992 Dependence on renal dialysis: Secondary | ICD-10-CM | POA: Diagnosis not present

## 2021-11-11 DIAGNOSIS — R52 Pain, unspecified: Secondary | ICD-10-CM | POA: Diagnosis not present

## 2021-11-11 DIAGNOSIS — N2581 Secondary hyperparathyroidism of renal origin: Secondary | ICD-10-CM | POA: Diagnosis not present

## 2021-11-11 DIAGNOSIS — N186 End stage renal disease: Secondary | ICD-10-CM | POA: Diagnosis not present

## 2021-11-14 DIAGNOSIS — D689 Coagulation defect, unspecified: Secondary | ICD-10-CM | POA: Diagnosis not present

## 2021-11-14 DIAGNOSIS — Z992 Dependence on renal dialysis: Secondary | ICD-10-CM | POA: Diagnosis not present

## 2021-11-14 DIAGNOSIS — D509 Iron deficiency anemia, unspecified: Secondary | ICD-10-CM | POA: Diagnosis not present

## 2021-11-14 DIAGNOSIS — R52 Pain, unspecified: Secondary | ICD-10-CM | POA: Diagnosis not present

## 2021-11-14 DIAGNOSIS — D649 Anemia, unspecified: Secondary | ICD-10-CM | POA: Diagnosis not present

## 2021-11-14 DIAGNOSIS — N186 End stage renal disease: Secondary | ICD-10-CM | POA: Diagnosis not present

## 2021-11-14 DIAGNOSIS — N2581 Secondary hyperparathyroidism of renal origin: Secondary | ICD-10-CM | POA: Diagnosis not present

## 2021-11-15 DIAGNOSIS — N186 End stage renal disease: Secondary | ICD-10-CM | POA: Diagnosis not present

## 2021-11-15 DIAGNOSIS — I5022 Chronic systolic (congestive) heart failure: Secondary | ICD-10-CM | POA: Diagnosis not present

## 2021-11-15 DIAGNOSIS — J449 Chronic obstructive pulmonary disease, unspecified: Secondary | ICD-10-CM | POA: Diagnosis not present

## 2021-11-16 DIAGNOSIS — R52 Pain, unspecified: Secondary | ICD-10-CM | POA: Diagnosis not present

## 2021-11-16 DIAGNOSIS — D509 Iron deficiency anemia, unspecified: Secondary | ICD-10-CM | POA: Diagnosis not present

## 2021-11-16 DIAGNOSIS — D689 Coagulation defect, unspecified: Secondary | ICD-10-CM | POA: Diagnosis not present

## 2021-11-16 DIAGNOSIS — Z992 Dependence on renal dialysis: Secondary | ICD-10-CM | POA: Diagnosis not present

## 2021-11-16 DIAGNOSIS — N186 End stage renal disease: Secondary | ICD-10-CM | POA: Diagnosis not present

## 2021-11-16 DIAGNOSIS — D649 Anemia, unspecified: Secondary | ICD-10-CM | POA: Diagnosis not present

## 2021-11-16 DIAGNOSIS — N2581 Secondary hyperparathyroidism of renal origin: Secondary | ICD-10-CM | POA: Diagnosis not present

## 2021-11-17 ENCOUNTER — Telehealth: Payer: Self-pay | Admitting: Family Medicine

## 2021-11-17 ENCOUNTER — Other Ambulatory Visit: Payer: Self-pay | Admitting: Family Medicine

## 2021-11-17 NOTE — Telephone Encounter (Signed)
LMOVM refill sent in, request came in from pharmacy

## 2021-11-17 NOTE — Telephone Encounter (Signed)
°  Prescription Request  11/17/2021  Is this a "Controlled Substance" medicine? NO  Have you seen your PCP in the last 2 weeks? NO  If YES, route message to pool  -  If NO, patient needs to be scheduled for appointment.  What is the name of the medication or equipment? Albuterol 108 (90 Base)  Have you contacted your pharmacy to request a refill? YES    Which pharmacy would you like this sent to? [facility] in [place]   Patient notified that their request is being sent to the clinical staff for review and that they should receive a response within 2 business days.

## 2021-11-19 DIAGNOSIS — D509 Iron deficiency anemia, unspecified: Secondary | ICD-10-CM | POA: Diagnosis not present

## 2021-11-19 DIAGNOSIS — D689 Coagulation defect, unspecified: Secondary | ICD-10-CM | POA: Diagnosis not present

## 2021-11-19 DIAGNOSIS — D649 Anemia, unspecified: Secondary | ICD-10-CM | POA: Diagnosis not present

## 2021-11-19 DIAGNOSIS — N186 End stage renal disease: Secondary | ICD-10-CM | POA: Diagnosis not present

## 2021-11-19 DIAGNOSIS — Z992 Dependence on renal dialysis: Secondary | ICD-10-CM | POA: Diagnosis not present

## 2021-11-19 DIAGNOSIS — R52 Pain, unspecified: Secondary | ICD-10-CM | POA: Diagnosis not present

## 2021-11-19 DIAGNOSIS — N2581 Secondary hyperparathyroidism of renal origin: Secondary | ICD-10-CM | POA: Diagnosis not present

## 2021-11-20 ENCOUNTER — Telehealth: Payer: Self-pay | Admitting: Family Medicine

## 2021-11-20 DIAGNOSIS — C801 Malignant (primary) neoplasm, unspecified: Secondary | ICD-10-CM

## 2021-11-20 MED ORDER — OXYCODONE HCL 5 MG PO TABS: 5.0000 mg | ORAL_TABLET | ORAL | 0 refills | Status: AC | PRN

## 2021-11-20 NOTE — Telephone Encounter (Signed)
Sent refill for the patient's

## 2021-11-20 NOTE — Telephone Encounter (Signed)
Aware and verbalizes understanding.  

## 2021-11-20 NOTE — Telephone Encounter (Signed)
°  Prescription Request  11/20/2021  Is this a "Controlled Substance" medicine? YES  Have you seen your PCP in the last 2 weeks? NO  If YES, route message to pool  -  If NO, patient needs to be scheduled for appointment.  What is the name of the medication or equipment? Oxycodone 5 mg. Patient cancer has spreaded all over his body and is under Hospice. Takes  one every four hours or two if needed  Have you contacted your pharmacy to request a refill? NO   Which pharmacy would you like this sent to? [facility] [place]   Patient notified that their request is being sent to the clinical staff for review and that they should receive a response within 2 business days.

## 2021-11-28 DIAGNOSIS — N186 End stage renal disease: Secondary | ICD-10-CM | POA: Diagnosis not present

## 2021-11-28 DIAGNOSIS — Z992 Dependence on renal dialysis: Secondary | ICD-10-CM | POA: Diagnosis not present

## 2021-11-28 DIAGNOSIS — N179 Acute kidney failure, unspecified: Secondary | ICD-10-CM | POA: Diagnosis not present

## 2021-12-01 ENCOUNTER — Other Ambulatory Visit: Payer: Self-pay | Admitting: Oncology

## 2021-12-01 DIAGNOSIS — N179 Acute kidney failure, unspecified: Secondary | ICD-10-CM | POA: Diagnosis not present

## 2021-12-01 DIAGNOSIS — N186 End stage renal disease: Secondary | ICD-10-CM | POA: Diagnosis not present

## 2021-12-01 DIAGNOSIS — Z992 Dependence on renal dialysis: Secondary | ICD-10-CM | POA: Diagnosis not present

## 2021-12-02 DEATH — deceased

## 2021-12-08 IMAGING — DX DG CHEST 1V PORT
1 series · 1 of 1 positions shown · non-contrast
Comparison: Two-view chest x-ray 07/14/2020

CLINICAL DATA: Shortness of breath.

EXAM:
PORTABLE CHEST 1 VIEW

[chest ap]
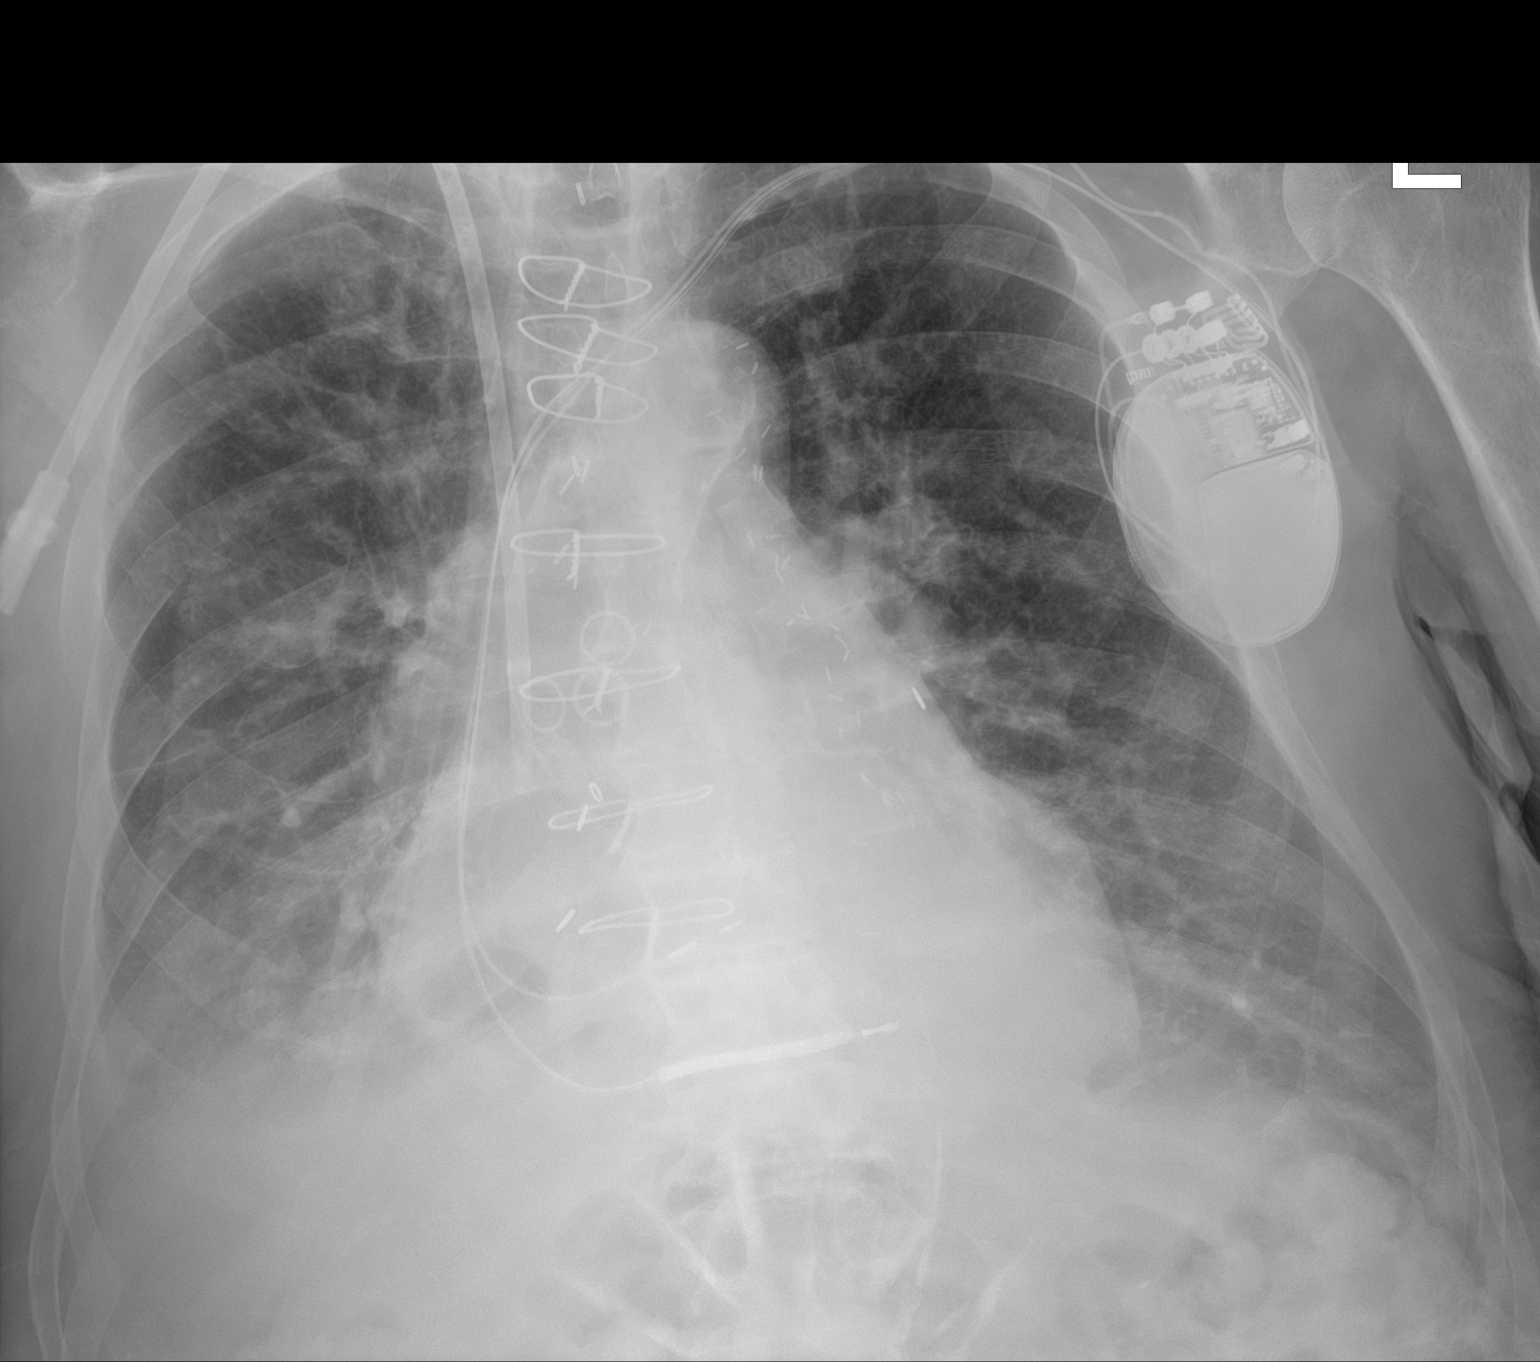

[1 of 1 positions shown; findings below may reference images not displayed]

FINDINGS: Heart is enlarged. Atherosclerotic changes are noted at the aortic
arch.

Tunneled right IJ dialysis catheter terminates at the superior
cavoatrial junction.

Bilateral pleural effusions are present, right greater than left.
Bilateral interstitial and airspace disease noted, right greater
than left.
IMPRESSION: Cardiomegaly with bilateral pleural effusions and bilateral
interstitial and airspace disease, right greater than left. Findings
are consistent with congestive heart failure.

## 2021-12-15 ENCOUNTER — Encounter (HOSPITAL_COMMUNITY): Payer: Medicare Other | Admitting: Cardiology

## 2021-12-22 ENCOUNTER — Ambulatory Visit: Payer: Medicare Other | Admitting: Cardiovascular Disease
